# Patient Record
Sex: Female | Born: 1970
Health system: Southern US, Community
[De-identification: ages and names within clinical notes are randomized; demographics above are authoritative.]

## PROBLEM LIST (undated history)

## (undated) DIAGNOSIS — M67813 Other specified disorders of tendon, right shoulder: Secondary | ICD-10-CM

## (undated) DIAGNOSIS — K589 Irritable bowel syndrome without diarrhea: Secondary | ICD-10-CM

## (undated) DIAGNOSIS — T7840XA Allergy, unspecified, initial encounter: Secondary | ICD-10-CM

## (undated) DIAGNOSIS — R0600 Dyspnea, unspecified: Secondary | ICD-10-CM

## (undated) DIAGNOSIS — J45909 Unspecified asthma, uncomplicated: Secondary | ICD-10-CM

## (undated) DIAGNOSIS — E739 Lactose intolerance, unspecified: Secondary | ICD-10-CM

## (undated) DIAGNOSIS — Z780 Asymptomatic menopausal state: Secondary | ICD-10-CM

## (undated) DIAGNOSIS — M2550 Pain in unspecified joint: Secondary | ICD-10-CM

## (undated) DIAGNOSIS — H269 Unspecified cataract: Secondary | ICD-10-CM

## (undated) DIAGNOSIS — T884XXA Failed or difficult intubation, initial encounter: Secondary | ICD-10-CM

## (undated) DIAGNOSIS — I10 Essential (primary) hypertension: Secondary | ICD-10-CM

## (undated) DIAGNOSIS — R51 Headache: Secondary | ICD-10-CM

## (undated) DIAGNOSIS — G43909 Migraine, unspecified, not intractable, without status migrainosus: Secondary | ICD-10-CM

## (undated) DIAGNOSIS — R519 Headache, unspecified: Secondary | ICD-10-CM

## (undated) DIAGNOSIS — Z87820 Personal history of traumatic brain injury: Secondary | ICD-10-CM

## (undated) DIAGNOSIS — E559 Vitamin D deficiency, unspecified: Secondary | ICD-10-CM

## (undated) DIAGNOSIS — N1831 Chronic kidney disease, stage 3a: Secondary | ICD-10-CM

## (undated) DIAGNOSIS — Z9889 Other specified postprocedural states: Secondary | ICD-10-CM

## (undated) DIAGNOSIS — M7541 Impingement syndrome of right shoulder: Secondary | ICD-10-CM

## (undated) DIAGNOSIS — Z91018 Allergy to other foods: Secondary | ICD-10-CM

## (undated) DIAGNOSIS — R600 Localized edema: Secondary | ICD-10-CM

## (undated) DIAGNOSIS — R002 Palpitations: Secondary | ICD-10-CM

## (undated) DIAGNOSIS — F419 Anxiety disorder, unspecified: Secondary | ICD-10-CM

## (undated) DIAGNOSIS — L219 Seborrheic dermatitis, unspecified: Secondary | ICD-10-CM

## (undated) DIAGNOSIS — R112 Nausea with vomiting, unspecified: Secondary | ICD-10-CM

## (undated) DIAGNOSIS — N979 Female infertility, unspecified: Secondary | ICD-10-CM

## (undated) DIAGNOSIS — D649 Anemia, unspecified: Secondary | ICD-10-CM

## (undated) DIAGNOSIS — E785 Hyperlipidemia, unspecified: Secondary | ICD-10-CM

## (undated) DIAGNOSIS — H209 Unspecified iridocyclitis: Secondary | ICD-10-CM

## (undated) DIAGNOSIS — K760 Fatty (change of) liver, not elsewhere classified: Secondary | ICD-10-CM

## (undated) DIAGNOSIS — H35039 Hypertensive retinopathy, unspecified eye: Secondary | ICD-10-CM

## (undated) DIAGNOSIS — M549 Dorsalgia, unspecified: Secondary | ICD-10-CM

## (undated) DIAGNOSIS — E0789 Other specified disorders of thyroid: Secondary | ICD-10-CM

## (undated) DIAGNOSIS — M7500 Adhesive capsulitis of unspecified shoulder: Secondary | ICD-10-CM

## (undated) HISTORY — DX: Allergy to other foods: Z91.018

## (undated) HISTORY — DX: Headache: R51

## (undated) HISTORY — DX: Unspecified cataract: H26.9

## (undated) HISTORY — DX: Dorsalgia, unspecified: M54.9

## (undated) HISTORY — DX: Adhesive capsulitis of unspecified shoulder: M75.00

## (undated) HISTORY — DX: Hyperlipidemia, unspecified: E78.5

## (undated) HISTORY — PX: BREAST SURGERY: SHX581

## (undated) HISTORY — DX: Irritable bowel syndrome, unspecified: K58.9

## (undated) HISTORY — DX: Fatty (change of) liver, not elsewhere classified: K76.0

## (undated) HISTORY — DX: Unspecified iridocyclitis: H20.9

## (undated) HISTORY — PX: LEFT OOPHORECTOMY: SHX1961

## (undated) HISTORY — PX: TUBAL LIGATION: SHX77

## (undated) HISTORY — DX: Lactose intolerance, unspecified: E73.9

## (undated) HISTORY — DX: Vitamin D deficiency, unspecified: E55.9

## (undated) HISTORY — DX: Asymptomatic menopausal state: Z78.0

## (undated) HISTORY — DX: Other specified disorders of thyroid: E07.89

## (undated) HISTORY — DX: Localized edema: R60.0

## (undated) HISTORY — DX: Other specified disorders of tendon, right shoulder: M67.813

## (undated) HISTORY — DX: Seborrheic dermatitis, unspecified: L21.9

## (undated) HISTORY — DX: Headache, unspecified: R51.9

## (undated) HISTORY — DX: Female infertility, unspecified: N97.9

## (undated) HISTORY — DX: Allergy, unspecified, initial encounter: T78.40XA

## (undated) HISTORY — DX: Hypertensive retinopathy, unspecified eye: H35.039

## (undated) HISTORY — DX: Migraine, unspecified, not intractable, without status migrainosus: G43.909

## (undated) HISTORY — DX: Palpitations: R00.2

## (undated) HISTORY — DX: Dyspnea, unspecified: R06.00

## (undated) HISTORY — DX: Personal history of traumatic brain injury: Z87.820

## (undated) HISTORY — DX: Anemia, unspecified: D64.9

## (undated) HISTORY — PX: BACK SURGERY: SHX140

## (undated) HISTORY — DX: Pain in unspecified joint: M25.50

## (undated) NOTE — *Deleted (*Deleted)
[facility] - Clinic Note  03/23/2020     CHIEF COMPLAINT Patient presents for No chief complaint on file.   HISTORY OF PRESENT ILLNESS: [name] is a 35 y.o. female who presents to the clinic today for:   pt states she has been having a hard time the past couple of days, she is using a red top drop to help with the pain, she states increasing AT's helped her vision while at work  Referring physician: Sherlene Shams, MD 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 brimonidine (ALPHAGAN) 0.15 % ophthalmic solution Place 1 drop into both eyes in the morning and at bedtime.  . Bromfenac Sodium (PROLENSA) 0.07 % SOLN Place 1 drop into both eyes 4 (four) times daily.  . cyclopentolate (CYCLODRYL,CYCLOGYL) 1 % ophthalmic solution 1 drop 2 (two) times daily.  . dorzolamide-timolol (COSOPT) 22.3-6.8 MG/ML ophthalmic solution Place 1 drop into both eyes 2 (two) times daily.  . ISOPTO ATROPINE 1 % ophthalmic solution   . prednisoLONE acetate (PRED FORTE) 1 % ophthalmic suspension Place 1 drop into both eyes every hour. (Patient taking differently: Place 1 drop into both eyes every 2 (two) hours while awake. )   No current facility-administered medications for this visit. (Ophthalmic Drugs)   Current Outpatient Medications (Other)  Medication Sig  . albuterol (VENTOLIN HFA) 108 (90 Base) MCG/ACT inhaler Inhale 2 puffs into the lungs every 6 (six) hours as needed for wheezing or shortness of breath.  . ALPRAZolam (XANAX) 0.5 MG tablet Take 1 tablet (0.5 mg total) by mouth 3 (three) times daily as needed (Headache).  [name] aspirin EC 81 MG tablet Take 81 mg by mouth daily.  [name] atorvastatin (LIPITOR) 20 MG tablet Take 1 tablet (20 mg total) by mouth daily at 6  PM.  . benzonatate (TESSALON) 100 MG capsule Take 1 capsule (100 mg total) by mouth 3 (three) times daily as needed for cough.  . Blood Glucose Monitoring Suppl (FREESTYLE LITE) DEVI 1 each by Does not apply route 2 (two) times daily. E11.9  . butalbital-acetaminophen-caffeine (FIORICET) 50-325-40 MG tablet Take 1 tablet by mouth every 6 (six) hours as needed for headache. Do not refill in less than 30 day  . celecoxib (CELEBREX) 200 MG capsule Take 1 capsule (200 mg total) by mouth 2 (two) times daily as needed.  . diazepam (VALIUM) 2 MG tablet Take 1 tablet (2 mg total) by mouth every 6 (six) hours as needed (Headache).  . diltiazem (CARDIZEM LA) 240 MG 24 hr tablet TAKE 1 TABLET BY MOUTH DAILY  . EPINEPHrine 0.3 mg/0.3 mL IJ SOAJ injection Inject 0.3 mLs (0.3 mg total) into the muscle as needed (for allergic reaction).  [name] ergocalciferol (DRISDOL) 1.25 MG (50000 UT) capsule Take 1 capsule (50,000 Units total) by mouth once a week.  . Fremanezumab-vfrm (AJOVY) 225 MG/1.5ML SOAJ Inject 675 mg into the skin every 3 (three) months.  [name] glucose blood (FREESTYLE LITE) test strip 1 each by Other route 2 (two) times daily. E11.9  . hydrochlorothiazide (MICROZIDE) 12.5 MG capsule TAKE 1 CAPSULE BY MOUTH DAILY.  [name] insulin lispro (HUMALOG KWIKPEN) 100 UNIT/ML KwikPen Inject 0.2 mLs (20 Units total) into the skin 2 (two) times daily with a meal.  . Insulin Pen Needle (PEN NEEDLES  31GX5/16") 31G X 8 MM MISC 1 each by Does not apply route in the morning, at noon, in the evening, and at bedtime. E11.9  . ketorolac (TORADOL) 10 MG tablet Take 1 tablet (10 mg total) by mouth every 6 (six) hours as needed. (Patient taking differently: Take 10 mg by mouth every 6 (six) hours as needed (Headache). )  . Lancets (FREESTYLE) lancets 1 each by Other route 2 (two) times daily. E11.9  . losartan-hydrochlorothiazide (HYZAAR) 50-12.5 MG tablet Take 1 tablet by mouth daily.  . metFORMIN (GLUCOPHAGE-XR) 500 MG 24 hr tablet Take  2 tablets (1,000 mg total) by mouth 2 (two) times daily. (Patient taking differently: Take 1,000 mg by mouth daily. )  . metoprolol succinate (TOPROL-XL) 25 MG 24 hr tablet TAKE 1/2 TABLET BY MOUTH DAILY.  [name] ondansetron (ZOFRAN ODT) 4 MG disintegrating tablet Take 1 tablet (4 mg total) by mouth every 8 (eight) hours as needed for nausea or vomiting.  [name] oxyCODONE-acetaminophen (PERCOCET/ROXICET) 5-325 MG tablet Take by mouth as needed for severe pain.  . Semaglutide, 1 MG/DOSE, (OZEMPIC, 1 MG/DOSE,) 2 MG/1.5ML SOPN Inject 0.75 mLs (1 mg total) into the skin once a week.  . TRESIBA FLEXTOUCH 100 UNIT/ML FlexTouch Pen INJECT 0.6 MLS (60 UNITS TOTAL) INTO THE SKIN DAILY.   No current facility-administered medications for this visit. (Other)      REVIEW OF SYSTEMS:    ALLERGIES Allergies  Allergen Reactions  . Bamlanivimab Anaphylaxis, Itching, Palpitations, Rash, Shortness Of Breath and Swelling  . Botox [Onabotulinumtoxina] Shortness Of Breath    syncope  . Clindamycin/Lincomycin Other (See Comments)  . Kiwi Extract Shortness Of Breath  . Maxalt [Rizatriptan Benzoate] Anaphylaxis    Chest pain  . Nitrous Oxide Shortness Of Breath    syncope  . Triptans Shortness Of Breath  . Aspirin Nausea And Vomiting    Mouth blisters  . Contrast Media [Iodinated Diagnostic Agents]   . Flagyl [Metronidazole]   . Hydrocodone-Acetaminophen Other (See Comments)  . Latex     Sometimes causes rash  . Mango Flavor   . Rizatriptan Other (See Comments)  . Vicodin [Hydrocodone-Acetaminophen]     hallucinations  . Flu Virus Vaccine Palpitations  . Penicillins Nausea And Vomiting, Rash and Other (See Comments)    Did it involve swelling of the face/tongue/throat, SOB, or low BP? No Did it involve sudden or severe rash/hives, skin peeling, or any reaction on the inside of your mouth or nose? No Did you need to seek medical attention at a hospital or doctor's office? Yes When did it last  happen?patient was 32 years old If all above answers are "NO", may proceed with cephalosporin use.  . Tetracyclines & Related Nausea And Vomiting and Rash    PAST MEDICAL HISTORY Past Medical History:  Diagnosis Date  . Allergy   . Anemia   . Anxiety    claustrophobic  . Asthma   . Back pain   . Biceps tendonosis of right shoulder   . Diabetes mellitus 2011   did not start metforfin, losing weight  . Dyspnea   . Fatty liver   . Food allergy   . Headache disorder   . History of concussion   . HLD (hyperlipidemia)   . Hypertension   . IBS (irritable bowel syndrome)   . Infertility, female   . Joint pain   . Lactose intolerance   . Leg edema   . Migraines   . Other specified disorders of thyroid   .  Palpitation   . Post-menopausal   . Seborrheic dermatitis   . Shoulder impingement syndrome, right   . Vitamin D deficiency    Past Surgical History:  Procedure Laterality Date  . ABDOMINAL HYSTERECTOMY  2006   heavy menses, endometriosis, l oophrectomy  . BACK SURGERY    . BREAST BIOPSY Right 2018   benign  . BREAST EXCISIONAL BIOPSY Right 2003   benign  . BREAST EXCISIONAL BIOPSY Right 1999   benign  . BREAST SURGERY     right breast x 2 , benign  . HERNIA REPAIR  2003   left inguinal   . LEFT OOPHORECTOMY    . SHOULDER ARTHROSCOPY WITH SUBACROMIAL DECOMPRESSION AND OPEN ROTATOR C Right 07/14/2016   Procedure: RIGHT SHOULDER ARTHROSCOPY WITH SUBACROMIAL DECOMPRESSION, DISTAL CLAVICLE RESECTION AND MINI OPEN ROTATOR CUFF REPAIR, OPEN BICEP TENDODESIS;  Surgeon: Valeria Batman, MD;  Location: [facility];  Service: Orthopedics;  Laterality: Right;  . SHOULDER CLOSED REDUCTION Right 09/08/2016   Procedure: RIGHT CLOSED MANIPULATION SHOULDER;  Surgeon: Valeria Batman, MD;  Location: [facility];  Service: Orthopedics;  Laterality: Right;  . TUBAL LIGATION      FAMILY HISTORY Family History  Problem Relation Age of Onset  .  Diabetes Mother   . Heart disease Mother 89  . Hypertension Mother   . Hyperlipidemia Mother   . Heart failure Mother   . Stroke Mother   . Thyroid disease Mother   . Depression Mother   . Sleep apnea Mother   . Obesity Mother   . Cancer Father 77       Lung Cancer  . Heart disease Father   . Mental illness Sister        bipolar, substance abuse,  clean 2 yrs  . Hypertension Sister   . Cancer Paternal Grandmother 53       breast cancer  . Breast cancer Paternal Grandmother   . Diabetes Maternal Grandmother   . Hypertension Maternal Grandmother   . Diabetes Son     SOCIAL HISTORY Social History   Tobacco Use  . Smoking status: Never Smoker  . Smokeless tobacco: Never Used  Vaping Use  . Vaping Use: Never used  Substance Use Topics  . Alcohol use: No  . 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 Retinal edema  H35.81   3. Essential hypertension  I10   4. Hypertensive retinopathy of both eyes  H35.033   5. Diabetes mellitus type 2 without retinopathy (HCC)  E11.9   6. Combined forms of age-related cataract of both eyes  H25.813     1,2. Anterior uveitis OU  - eye pain, redness, photophobia since Dec 2020, after getting COVID infection (was hospitalized for 2 wks)  - has been managed by [facility]  - underwent limited lab work up on 06/21/19   Quant-Gold, ACE, ANA, HLA-B27 -- all normal   ESR slightly elevated (37)  - initial exam here 08.18.21 -- +conj injection OU (OD > OS); 1-2+ AC cell OU; no evidence of posterior involvement  - today, BCVA 20/20 OD; 20/30 OS  (+dryness) and exam shows interval improvement in inflammation, but persistent corneal findings -- +PEE OU  -  suspect most of pt's symptoms related to cornea more than anterior uveitis -- working long hours in front of computer  -  OCT without macular edema OU  - FA 08.25.21 shows Mild focal late perivascular leakage temporal periphery OU -- ?inflammatory vs HTN  - continue   PF decrease to TID OU   prolensa ddecrease to TID OU   Cyclopentolate or tropicamide BID OU -- use as needed   Cosopt BID OU   Brimonidine BID OU  - cont Systane QID OU  - add lubricating gel/ointment QHS OU  - IOP 17 -- improved on brim and cosopt, likely steroid response  - f/u in 2-3 weeks-- DFE/OCT  3,4. Hypertensive retinopathy OU - discussed importance of tight BP control - monitor  5. Diabetes mellitus, type 2 without retinopathy  - The incidence, risk factors for progression, natural history and treatment options for diabetic retinopathy  were discussed with patient.    - The need for close monitoring of blood glucose, blood pressure, and serum lipids, avoiding cigarette or any type of tobacco, and the need for long term follow up was also discussed with patient.  - f/u in 1 year, sooner prn  6. Mixed cataract OU  - The symptoms of cataract, surgical options, and treatments and risks were discussed with patient.  - discussed diagnosis and progression  - not yet visually significant  - monitor for now   Ophthalmic Meds Ordered this visit:  No orders of the defined types were placed in this encounter.     No follow-ups on file.  There are no Patient Instructions on file for this visit.  This document serves as a record of services personally performed by Karie Chimera, MD, PhD. It was created on their behalf by Herby Abraham, COA, an ophthalmic technician. The creation of this record is the provider's dictation and/or activities during the visit.    Electronically signed by: Herby Abraham, COA @TODAY @ 11:01 AM   Abbreviations: M myopia (nearsighted); A astigmatism; H hyperopia (farsighted); P presbyopia; Mrx spectacle prescription;  CTL contact lenses; OD right eye; OS left eye; OU both eyes  XT exotropia; ET  esotropia; PEK punctate epithelial keratitis; PEE punctate epithelial erosions; DES dry eye syndrome; MGD meibomian gland dysfunction; ATs artificial tears; PFAT's preservative free artificial tears; NSC nuclear sclerotic cataract; PSC posterior subcapsular cataract; ERM epi-retinal membrane; PVD posterior vitreous detachment; RD retinal detachment; DM diabetes mellitus; DR diabetic retinopathy; NPDR non-proliferative diabetic retinopathy; PDR proliferative diabetic retinopathy; CSME clinically significant macular edema; DME diabetic macular edema; dbh dot blot hemorrhages; CWS cotton wool spot; POAG primary open angle glaucoma; C/D cup-to-disc ratio; HVF humphrey visual field; GVF goldmann visual field; OCT optical coherence tomography; IOP intraocular pressure; BRVO Branch retinal vein occlusion; CRVO central retinal vein occlusion; CRAO central retinal artery occlusion; BRAO branch retinal artery occlusion; RT retinal tear; SB scleral buckle; PPV pars plana vitrectomy; VH Vitreous hemorrhage; PRP panretinal laser photocoagulation; IVK intravitreal kenalog; VMT vitreomacular traction; MH Macular hole;  NVD neovascularization of the disc; NVE neovascularization elsewhere; AREDS age related eye disease study; ARMD age related macular degeneration; POAG primary open angle glaucoma; EBMD epithelial/anterior basement membrane dystrophy; ACIOL anterior chamber intraocular lens; IOL intraocular lens; PCIOL posterior chamber intraocular lens; Phaco/IOL phacoemulsification with intraocular lens placement; PRK photorefractive keratectomy; LASIK laser assisted in situ keratomileusis; HTN hypertension; DM diabetes mellitus; COPD chronic obstructive pulmonary disease

---

## 1997-06-06 HISTORY — PX: BREAST EXCISIONAL BIOPSY: SUR124

## 1997-10-01 ENCOUNTER — Emergency Department (HOSPITAL_COMMUNITY): Admission: EM | Admit: 1997-10-01 | Discharge: 1997-10-01 | Payer: Self-pay | Admitting: *Deleted

## 1998-05-06 ENCOUNTER — Other Ambulatory Visit: Admission: RE | Admit: 1998-05-06 | Discharge: 1998-05-06 | Payer: Self-pay | Admitting: Obstetrics & Gynecology

## 1998-08-13 ENCOUNTER — Emergency Department (HOSPITAL_COMMUNITY): Admission: EM | Admit: 1998-08-13 | Discharge: 1998-08-13 | Payer: Self-pay | Admitting: Emergency Medicine

## 1998-08-23 ENCOUNTER — Inpatient Hospital Stay (HOSPITAL_COMMUNITY): Admission: AD | Admit: 1998-08-23 | Discharge: 1998-08-23 | Payer: Self-pay | Admitting: Obstetrics and Gynecology

## 1998-10-22 ENCOUNTER — Encounter: Admission: RE | Admit: 1998-10-22 | Discharge: 1998-11-04 | Payer: Self-pay | Admitting: *Deleted

## 1999-09-14 ENCOUNTER — Other Ambulatory Visit: Admission: RE | Admit: 1999-09-14 | Discharge: 1999-09-14 | Payer: Self-pay

## 1999-09-14 ENCOUNTER — Encounter (INDEPENDENT_AMBULATORY_CARE_PROVIDER_SITE_OTHER): Payer: Self-pay | Admitting: Specialist

## 2000-09-27 ENCOUNTER — Encounter: Payer: Self-pay | Admitting: Obstetrics and Gynecology

## 2000-09-27 ENCOUNTER — Ambulatory Visit (HOSPITAL_COMMUNITY): Admission: RE | Admit: 2000-09-27 | Discharge: 2000-09-27 | Payer: Self-pay | Admitting: Obstetrics and Gynecology

## 2001-06-06 HISTORY — PX: HERNIA REPAIR: SHX51

## 2001-06-06 HISTORY — PX: BREAST EXCISIONAL BIOPSY: SUR124

## 2001-06-06 HISTORY — PX: INGUINAL HERNIA REPAIR: SHX194

## 2001-10-09 ENCOUNTER — Other Ambulatory Visit: Admission: RE | Admit: 2001-10-09 | Discharge: 2001-10-09 | Payer: Self-pay | Admitting: Obstetrics and Gynecology

## 2003-06-07 HISTORY — PX: LUMBAR FUSION: SHX111

## 2003-10-13 ENCOUNTER — Other Ambulatory Visit: Payer: Self-pay

## 2003-10-21 ENCOUNTER — Inpatient Hospital Stay (HOSPITAL_COMMUNITY): Admission: RE | Admit: 2003-10-21 | Discharge: 2003-10-22 | Payer: Self-pay | Admitting: Specialist

## 2004-06-06 HISTORY — PX: LEFT OOPHORECTOMY: SHX1961

## 2004-06-06 HISTORY — PX: ABDOMINAL HYSTERECTOMY: SHX81

## 2006-03-27 ENCOUNTER — Ambulatory Visit: Payer: Self-pay | Admitting: Gynecology

## 2006-03-27 ENCOUNTER — Encounter (INDEPENDENT_AMBULATORY_CARE_PROVIDER_SITE_OTHER): Payer: Self-pay | Admitting: Gynecology

## 2006-11-01 ENCOUNTER — Ambulatory Visit: Payer: Self-pay | Admitting: Obstetrics & Gynecology

## 2006-11-15 ENCOUNTER — Ambulatory Visit: Payer: Self-pay | Admitting: Obstetrics & Gynecology

## 2006-12-04 ENCOUNTER — Ambulatory Visit: Payer: Self-pay | Admitting: Gynecology

## 2007-01-16 ENCOUNTER — Ambulatory Visit (HOSPITAL_COMMUNITY): Admission: RE | Admit: 2007-01-16 | Discharge: 2007-01-16 | Payer: Self-pay | Admitting: Gynecology

## 2007-01-16 ENCOUNTER — Ambulatory Visit: Payer: Self-pay | Admitting: Gynecology

## 2007-01-29 ENCOUNTER — Ambulatory Visit: Payer: Self-pay | Admitting: Gynecology

## 2007-03-01 ENCOUNTER — Ambulatory Visit: Payer: Self-pay | Admitting: Gynecology

## 2007-03-01 ENCOUNTER — Ambulatory Visit (HOSPITAL_COMMUNITY): Admission: RE | Admit: 2007-03-01 | Discharge: 2007-03-02 | Payer: Self-pay | Admitting: Gynecology

## 2007-03-01 ENCOUNTER — Encounter (INDEPENDENT_AMBULATORY_CARE_PROVIDER_SITE_OTHER): Payer: Self-pay | Admitting: Gynecology

## 2007-03-19 ENCOUNTER — Inpatient Hospital Stay (HOSPITAL_COMMUNITY): Admission: AD | Admit: 2007-03-19 | Discharge: 2007-03-20 | Payer: Self-pay | Admitting: Obstetrics & Gynecology

## 2007-04-09 ENCOUNTER — Ambulatory Visit: Payer: Self-pay | Admitting: Gynecology

## 2008-11-13 ENCOUNTER — Ambulatory Visit: Payer: Self-pay | Admitting: Obstetrics and Gynecology

## 2008-11-13 LAB — CONVERTED CEMR LAB
Chlamydia, DNA Probe: NEGATIVE
GC Probe Amp, Genital: NEGATIVE
HCV Ab: NEGATIVE
Hepatitis B Surface Ag: NEGATIVE
Trich, Wet Prep: NONE SEEN
Yeast Wet Prep HPF POC: NONE SEEN

## 2009-03-26 ENCOUNTER — Ambulatory Visit: Payer: Self-pay | Admitting: Family Medicine

## 2009-04-10 ENCOUNTER — Ambulatory Visit: Payer: Self-pay | Admitting: *Deleted

## 2009-04-23 ENCOUNTER — Ambulatory Visit: Payer: Self-pay | Admitting: Obstetrics & Gynecology

## 2009-04-23 ENCOUNTER — Encounter: Payer: Self-pay | Admitting: Obstetrics and Gynecology

## 2009-04-23 LAB — CONVERTED CEMR LAB
Chlamydia, DNA Probe: NEGATIVE
GC Probe Amp, Genital: NEGATIVE
HCV Ab: NEGATIVE
Hepatitis B Surface Ag: NEGATIVE

## 2009-04-24 ENCOUNTER — Encounter: Payer: Self-pay | Admitting: Obstetrics and Gynecology

## 2009-04-24 ENCOUNTER — Ambulatory Visit (HOSPITAL_COMMUNITY): Admission: RE | Admit: 2009-04-24 | Discharge: 2009-04-24 | Payer: Self-pay | Admitting: Obstetrics and Gynecology

## 2009-04-24 LAB — CONVERTED CEMR LAB
Clue Cells Wet Prep HPF POC: NONE SEEN
Trich, Wet Prep: NONE SEEN
WBC, Wet Prep HPF POC: NONE SEEN
Yeast Wet Prep HPF POC: NONE SEEN

## 2009-07-31 ENCOUNTER — Emergency Department: Payer: Self-pay | Admitting: Internal Medicine

## 2010-02-16 ENCOUNTER — Ambulatory Visit: Payer: Self-pay | Admitting: General Practice

## 2010-10-19 NOTE — H&P (Signed)
[name], SALO NO.:  [id number]   MEDICAL RECORD NO.:  [id number]         PATIENT TYPE:  WOIB   LOCATION:                                FACILITY:  [facility]   PHYSICIAN:  Ginger Carne, MD  DATE OF BIRTH:  1970/10/07   DATE OF ADMISSION:  02/28/2007  DATE OF DISCHARGE:                              HISTORY & PHYSICAL   Surgery scheduled for February 28, 2007.   REASON FOR HOSPITALIZATION:  Stage I endometriosis, chronic pelvic pain  and menometrorrhagia.   HISTORY OF PRESENT ILLNESS:  This patient is a 40 year old gravida 3,  para 2-0-1-2 female, admitted for a total vaginal hysterectomy and left  salpingo-oophorectomy.  The patient has had a longstanding history of  lower abdominal pain for years.  She has been evaluated by a laparoscopy  in August 2008, which demonstrated stage I endometriosis.  The patient  complains of dysmenorrhea, dyspareunia.  Her menses are 4 days in  length, significant heavy flow, and are about 28 days apart.  The  patient has had a history of hypertension which had precluded her use of  oral contraceptives.  When she has taken them in the past, her blood  pressure has been raised above the norm.  She had been offered Depo-  Lupron, which she declined, and daily norethindrone acetate.  She has no  genitourinary or gastrointestinal sources for her discomfort.  She has  had a transvaginal ultrasound which revealed no evidence for  intracavitary lesions including polyps or fibroids.  No intramural  lesions.  She has had a normal Pap smear within the past 12 months.   OB/GYN HISTORY:  The patient has had a bilateral tubal ligation in 2004.  She has had 2 normal vaginal deliveries in 1990 in 2004, and a right  salpingectomy because of an ectopic pregnancy in 1995.   ALLERGIES:  1. PENICILLIN  2. VICODIN  3. MAXALT  4. TETRACYCLINE  5. ASPIRIN.   She is able to take Percocet.   CURRENT MEDICATIONS:  1. Hydrochlorothiazide  12.5 mg daily.  2. Potassium 10 mEq per day.   PAST SURGICAL HISTORY:  1. The patient has had a left inguinal hernia repair.  2. Right breast biopsy x2.  3. Disk surgery in 2005.  4. A diagnostic laparoscopy is August 2008.  5. She has also had a myomectomy in 2002.  6. A presacral neurectomy in 2001.   REVIEW OF SYSTEMS:  14-point comprehensive review of systems within  normal limits.   SOCIAL HISTORY:  The patient is a nonsmoker.  Denies alcohol or illicit  drug abuse.   FAMILY HISTORY:  The patient has no first-degree relatives with breast,  ovarian or uterine carcinoma.   PHYSICAL EXAM:  Blood pressure is 119/79, weight 223 pounds, height 5  feet 5 inches.  HEENT: Grossly normal.  BREASTS:  Exam without mass, discharge, thickenings or tenderness.  CHEST:  Clear to percussion and auscultation.  CARDIOVASCULAR:  Exam without murmurs or enlargements.  Regular rate and  rhythm.  EXTREMITY/LYMPHATIC/SKIN/NEUROLOGICAL/MUSCULOSKELETAL SYSTEMS:  Within  normal limits.  ABDOMEN:  Soft without  gross hepatosplenomegaly.  PELVIC EXAM:  External genitalia, vulva and vagina normal.  Cervix  smooth without erosions or lesions.  Uterus is globular, about 4-6 weeks  in size.  Both adnexa palpable, found to be normal.  There is slight  tenderness on both sides.   IMPRESSION:  Chronic pelvic pain with menorrhagia and history of  endometriosis.   PLAN:  The patient declines the use of oral contraceptives for  hypertension reasons, and declined the use of Depo-Lupron and/or  norethindrone acetate.  She also declined a Mirena IUD.  She has no  desire for further childbearing; has had a tubal ligation and,  therefore, desires definitive surgery.  She has opted for a total  vaginal hysterectomy and left salpingo-oophorectomy with preservation of  right tube and ovary.  Ashby Dawes of said procedure discussed in detail  including risks of injury to ureter, bowel and bladder, possible  conversion  to a laparoscopic or open procedure, hemorrhage requiring a  blood transfusion, infection and other postoperative complications.  She  understands there is a 40% chance approximately of having her right  adnexa removed due to pain in the future.  The patient also understands  that she may continue to have pain in the future in her pelvis.  All  questions answered to the satisfaction of said patient, and the patient  verbalized understanding of same      Ginger Carne, MD  Electronically Signed     SHB/MEDQ  D:  02/27/2007  T:  02/27/2007  Job:  [id number]

## 2010-10-19 NOTE — Assessment & Plan Note (Signed)
NAME:  [name], TIETJE NO.:  [id number]   MEDICAL RECORD NO.:  [id number]          PATIENT TYPE:  POB   LOCATION:  [facility]         FACILITY:  [facility]   PHYSICIAN:  Argentina Donovan, MD        DATE OF BIRTH:  [birth year]   DATE OF SERVICE:  11/13/2008                                  CLINIC NOTE   The patient is a 40 year old African American female gravida 2, para 2-0-  0-2, who underwent total abdominal hysterectomy and left salpingo-  oophorectomy in late 2008.  She still has a right ovary, which was not  menopausal.  She comes in today complaining of vaginal irritation and  desires to be checked for STDs.  She has frequent bouts of vulvar  irritation.  She has sensitive skin as she has some early signs of  psoriasis around the hairline on her forehead and she has used vaginal  products over-the-counter occasionally with immediate burning denoting a  chemical irritation rather than an allergy.  She said when she used the  metronidazole gel, she developed blisters.  On examination, external  genitalia is normal.  BUS within normal limits.  Vagina is clean and  well rugated.  The apex is status hysterectomy, is not a significant  amount of discharge.  There is no sign of granulation tissue.  DNA for  chlamydia and GC were taken as well as a wet prep.  There is no sign of  a positive whiff and the patient will be screened for HIV, hepatitis,  and syphilis.  I talked to her about chemical irritants, especially  soaps, soft toilet tissue, etc, and or using over-the-counter  medications.  I have told her that we would contact her if there is any  need for medication at this point.   IMPRESSION:  Occasional vulvar irritation, skin sensitivity to  chemicals.           ______________________________  Argentina Donovan, MD     PR/MEDQ  D:  11/13/2008  T:  11/13/2008  Job:  [id number]

## 2010-10-19 NOTE — Assessment & Plan Note (Signed)
[name], [name] NO.:  [id number]   MEDICAL RECORD NO.:  [id number]          PATIENT TYPE:  POB   LOCATION:  [facility]         FACILITY:  [facility]   PHYSICIAN:  Elsie Lincoln, MD      DATE OF BIRTH:  [birth year]   DATE OF SERVICE:                                  CLINIC NOTE   The patient is a 40 year old female who is status post hysterectomy on  March 01, 2007 for chronic pelvic pain.  The uterus showed  __________ endometria, no hyperplasia.  Mild chronic cervicitis and no  dysplasia.  The left ovary and fallopian tube, she had a hemorrhagic  corpus luteum benign ovarian cystic follicles with no evidence of  malignancy.  The patient had sex a couple of days ago for the first  time.  It was not painful.  She is having some bowel issues, but she has  been diagnosed with irritable bowel syndrome and is going to try Activia  yogurt.  She waxes and wanes between constipation and diarrhea.  She is  not having urinary symptoms.  She is extremely happy with her surgery.  She does have 1 ovary left, she is not in menopause.   On physical exam:  Blood pressure 135/99.  Weight 223.5.  Pulse 78.  The vagina is pink, normal rugae, __________ intact.  Adnexa, no masses,  nontender.   ASSESSMENT AND PLAN:  A 40 year old female status post transvaginal  hysterectomy, left salpingo-oophorectomy, doing well.  Yearly exam,  however, no Pap smear needed as she has never had any freezing or  burning of her cervix and her pathology was negative for any dysplasia.  The patient is to follow up with her primary care Jazzmyn Filion for irritable  bowel.           ______________________________  Elsie Lincoln, MD     KL/MEDQ  D:  04/09/2007  T:  04/10/2007  Job:  [id number]

## 2010-10-19 NOTE — Op Note (Signed)
[name], LANYON              ACCOUNT NO.:  [id number]   MEDICAL RECORD NO.:  [id number]          PATIENT TYPE:  OIB   LOCATION:  9319                          FACILITY:  [facility]   PHYSICIAN:  Ginger Carne, MD  DATE OF BIRTH:  06/08/1970   DATE OF PROCEDURE:  03/01/2007  DATE OF DISCHARGE:                               OPERATIVE REPORT   PREOPERATIVE DIAGNOSIS:  Endometriosis, chronic pelvic pain and  menorrhagia.   POSTOPERATIVE DIAGNOSIS:  Endometriosis, chronic pelvic pain and  menorrhagia.   PROCEDURE:  Total vaginal hysterectomy, left salpingo-oophorectomy with  preservation of right tube and ovary.   SURGEON:  Ginger Carne, MD   ASSISTANT:  Dr. Okey Dupre.   ESTIMATED BLOOD LOSS:  Less than 50 mL.   SPECIMEN:  Uterus, cervix, left tube and ovary to pathology.   ANESTHESIA:  General.   OPERATIVE FINDINGS:  External genitalia, vulva and vagina normal.  Cervix smooth without erosions or lesions.  The uterus was globular  about 4 to 6 weeks in size, endometriotic lesions noted on the left  ovary, right tube and ovary appeared normal.  The patient had been  previously laparoscoped six weeks prior.   OPERATIVE PROCEDURE:  The patient prepped and draped in usual fashion  and placed in lithotomy position.  Betadine solution used for antiseptic  and the patient was catheterized prior to the procedure.  After adequate  general anesthesia, tenaculum placed on the anterior posterior lips of  the cervix.  Marcaine with epinephrine utilized as a paracervical block.  2 cm of anterior and posterior vaginal epithelium were incised  transversely.  Peritoneal reflections identified, opened without injury  to their respective organs.  Following this, the uterosacral and  cardinal ligament complexes clamped, cut and ligated with 0 Vicryl  suture and affixed to their respective vaginal walls.  The uterine  vasculature was clamped and ligated with 0 Vicryl suture.  This extended  to  the ascending branches.  The left infundibulopelvic ligament and  right utero-ovarian ligament was clamped, cut and ligated with  transfixation sutures twice.  Bleeding points hemostatically checked.  Blood clots removed.  Closure of the cuff in one layer of 0 Vicryl  running interlocking suture.  The patient tolerated the procedure well,  returned to post anesthesia recovery room in excellent condition.  Urine  clear at end of the procedure.      Ginger Carne, MD  Electronically Signed    SHB/MEDQ  D:  03/01/2007  T:  03/01/2007  Job:  [id number]

## 2010-10-19 NOTE — Assessment & Plan Note (Signed)
NAMESAFIA, PANZER NO.:  [id number]   MEDICAL RECORD NO.:  [id number]          PATIENT TYPE:  POB   LOCATION:  [facility] at [facility]         FACILITY:  [facility]   PHYSICIAN:  Elsie Lincoln, MD      DATE OF BIRTH:  [birth year]   DATE OF SERVICE:                                  CLINIC NOTE   The patient is a 40 year old female with new onset pelvic pain.  She saw  Dr. Cleta Alberts on May 28 who ordered a pelvic ultrasound, cultures.  Her  cultures were negative, her pelvic ultrasound showed a centimeters 1.1 x  0.7 x 0.8 simple cyst in the left ovary, uterus was normal and the right  ovary was normal as well.  Unlikely this cyst is causing this pain.  The  pain she has is not during intercourse, but after intercourse for  several days.  She recently has been constipated but she takes a [employer]  supplement which caused her to have regular bowel movements.  She has no  urinary complaints.  She is on the tramadol and that does seem to help.  I encourage her to wait several more weeks to see if she her pain  improves before we investigate anything surgically.  She does have  increasing cramps with her period.  However, she is hypertensive and I  do not think she would be a good candidate for long-term OCPs given that  she is hypertensive.  She is going to try to lose weight and possibly  this could help her blood pressure come down, she could possibly takes  OCPs at that point.  She does not want Depo.  Patient is to come back in  3 weeks.  She would see Dr. Mia Creek which I will gladly facilitate and  hopefully her pain will be better and will not need surgical  intervention.           ______________________________  Elsie Lincoln, MD     KL/MEDQ  D:  11/15/2006  T:  11/15/2006  Job:  [id number]

## 2010-10-19 NOTE — Assessment & Plan Note (Signed)
[name], [name] NO.:  [id number]   MEDICAL RECORD NO.:  [id number]          PATIENT TYPE:  POB   LOCATION:  [facility]         FACILITY:  [facility]   PHYSICIAN:  Elsie Lincoln, MD      DATE OF BIRTH:  [birth year]   DATE OF SERVICE:                                  CLINIC NOTE   The patient is a 40 year old female who complains of right pelvic pain.  The patient had a hysterectomy and a left salpingo-oophorectomy on  September 2008, by Dr. Blima Rich for supposed endometriosis.  There was no endometriosis on the pathology.  The patient has been  asymptomatic until recently, she is having some intermittent right lower  quadrant pain.  The patient had some irritable bowel symptoms before  that but she states that she is having normal bowel movements recently.  The patient had some pain with sex.  Also recently interestingly the  patient is separated from her husband.  Her husband cheated on her  recently.  She had a Photographer and he was found to be having  an affair with a woman at the gymnasium.  The patient requested repeat  STD screening as she did have some sex with him again after being  screened here earlier this year.  The patient denies burning with  urination but she is having some frequency.   PHYSICAL EXAMINATION:  ABDOMEN:  Obese, soft, nontender.  No  organomegaly.  No hernia.  GENITALIA:  Tanner 5.  Vagina pink, normal rugae.  Vaginal cuff intact.  Small amount of whitish physiologic discharge.  The patient is tender  over bladder.  The patient is tender over right levator, mildly tender  in the adnexa.  No masses felt throughout the abdomen.   ASSESSMENT/PLAN:  A 40 year old female with mild pelvic discomfort.  1. Sexually transmitted disease screening for exposure as described      above.  GC/Chlamydia, wet prep, hep B, hep C, HIV, RPR.  2. Transvaginal ultrasound.  3. UA, urine culture as the patient was actually more  tender over her      bladder.  4. Transvaginal ultrasound does not show anything, maybe the patient      would benefit from some physical therapy as the patient was very      tender over her levators.           ______________________________  Elsie Lincoln, MD     KL/MEDQ  D:  04/23/2009  T:  04/24/2009  Job:  [id number]

## 2010-10-19 NOTE — Discharge Summary (Signed)
[name], [name]              ACCOUNT NO.:  [id number]   MEDICAL RECORD NO.:  [id number]          PATIENT TYPE:  OIB   LOCATION:  9319                          FACILITY:  [facility]   PHYSICIAN:  Ginger Carne, MD  DATE OF BIRTH:  09-Apr-1971   DATE OF ADMISSION:  03/01/2007  DATE OF DISCHARGE:  03/02/2007                               DISCHARGE SUMMARY   REASON FOR HOSPITALIZATION:  Stage I endometriosis, chronic pelvic pain  and menorrhagia.   IN-HOSPITAL PROCEDURES:  Total vaginal hysterectomy and left salpingo-  oophorectomy with preservation of right tube and ovary.   FINAL DIAGNOSES:  1. Stage I endometriosis.  2. Chronic pelvic pain.  3. Menorrhagia.   HOSPITAL COURSE:  This is a 40 year old African-American female, who  underwent the above-mentioned procedure on March 01, 2007.  Her  postoperative course was unremarkable.  Creatinine 0.76, H and H 33.9  and 11.5 postoperatively.  She was afebrile, voided well.  Abdomen soft,  positive bowel sounds.  Lungs clear.  Calves without tenderness.  Scant  vaginal flow.   She was discharged with routine postoperative instructions, including  contacting the office for temperature elevation above 100.4 degrees,  Fahrenheit; increasing abdominal pain, vaginal bleeding, GI or GU  complaints.  She was asked to be seen in the office in four weeks for  her postoperative visit.   She will be sent home with Percocet 5/325 one to two every four to six  hours for analgesia and continue her home medications, including  hydrochlorothiazide 12.5 mg daily p.o. and K-Dur 10 mEq one daily and  Ventolin inhaler to be used as needed.  All questions were answered to  the satisfaction of said patient and patient verbalized understanding of  same.      Ginger Carne, MD  Electronically Signed     SHB/MEDQ  D:  03/02/2007  T:  03/02/2007  Job:  [id number]

## 2010-10-19 NOTE — Op Note (Signed)
NAMEZENITH, KERCHEVAL              ACCOUNT NO.:  [id number]   MEDICAL RECORD NO.:  [id number]          PATIENT TYPE:  AMB   LOCATION:  [facility]                           FACILITY:  [facility]   PHYSICIAN:  Ginger Carne, MD  DATE OF BIRTH:  1970/10/13   DATE OF PROCEDURE:  01/16/2007  DATE OF DISCHARGE:                               OPERATIVE REPORT   PREOPERATIVE DIAGNOSIS:  Chronic pelvic pain.   POSTOPERATIVE DIAGNOSIS:  Stage I endometriosis.   PROCEDURE:  Diagnostic laparoscopy.   SURGEON:  Ginger Carne, M.D.   ASSISTANT:  None.   COMPLICATIONS:  None immediate.   ESTIMATED BLOOD LOSS:  Minimal.   SPECIMEN:  None.   OPERATIVE FINDINGS:  External genitalia, vulva and vagina normal.  Cervix smooth without erosions or lesions.  Laparoscopic evaluation  revealed stage I endometriosis with lesions located on the broad  ligaments bilaterally as well as the surface of the ovaries bilaterally.  No evidence of adhesive disease noted.  Large and small bowel grossly  normal.  No evidence of femoral, inguinal or obturator hernias.  Upper  abdomen normal. Posterior deformable buldging of the uterus consistent  with possible adenomyosis.   OPERATIVE PROCEDURE:  The patient prepped and draped in usual fashion  and placed lithotomy position.  Betadine solution used for antiseptic  and the patient was catheterized prior to procedure.  After adequate  general anesthesia, tenaculum placed on the anterior lip of the cervix  and a Hickman tenaculum in the endocervical canal.  Afterwards a  vertical infraumbilical incision was made.  The Veress needle placed in  the abdomen.  Opening closing pressures were 10-15 mmHg. Needle released  and trocar placed in same incision.  Laparoscope placed in trocar  sleeve.  Afterwards 5 mm port made under direct visualization.  Following this inspection of the pelvic and upper abdominal contents was  carried out.  Photography taken. Afterwards gas  released, trocars  removed.  Closure of the 10-mm fascia site with 0 Vicryl suture and 4-0  Vicryl for subcuticular closure.  Instrument and sponge count were  correct.  The patient tolerated the procedure well, returned post  anesthesia recovery room in excellent condition.      Ginger Carne, MD  Electronically Signed    SHB/MEDQ  D:  01/16/2007  T:  01/16/2007  Job:  [id number]

## 2010-10-22 NOTE — Op Note (Signed)
NAME:  [name], [name]                        ACCOUNT NO.:  [id number]   MEDICAL RECORD NO.:  [id number]                   PATIENT TYPE:  AMB   LOCATION:  DAY                                  FACILITY:  [facility]   PHYSICIAN:  Jene Every, M.D.                 DATE OF BIRTH:  1971-01-12   DATE OF PROCEDURE:  10/20/2003  DATE OF DISCHARGE:                                 OPERATIVE REPORT   PREOPERATIVE DIAGNOSIS:  Spinal stenosis, L5-S1 left.   POSTOPERATIVE DIAGNOSIS:  Spinal stenosis, L5-S1 left.   OPERATION PERFORMED:  Lateral recess decompression, foraminotomy of L5,  decompression of L5 and S1 nerve roots.  Evaluation of the L5-S1 disk.   SURGEON:  Jene Every, M.D.   ASSISTANT:  Roma Schanz, P.A.   ANESTHESIA:  General.   INDICATIONS FOR PROCEDURE:  The patient is a 40 year old with an L5  radiculopathy on the left, secondary to foraminal stenosis, small disk  protrusion.  Operative intervention was indicated for decompression of the  L5 nerve root.  She had a selective nerve root block that was temporarily  efficacious.  She had radicular symptoms into the great toe and mild  extensor hallucis longus weakness.  Operative intervention was indicated for  decompression of the L5 nerve root.  Risks and benefits were discussed  including bleeding, infection, damage to neurovascular structures, recurrent  disk herniation, need for revision etc.   DESCRIPTION OF PROCEDURE:  Patient in supine position.  After satisfactory  induction of general anesthesia, 1 g of Kefzol, she was placed prone on the  [place] frame.  All bony prominences were well padded.  The lumbar region  was prepped and draped in the usual sterile fashion.  Two 18 gauge spinal  needles were utilized to localize the 5-1 interspace confirmed with x-ray.  Incision was made on the spinous process of 5-1.  Subcutaneous tissue were  dissected.  Electrocautery utilized to achieve hemostasis.  Dorsal lumbar  fascia identified and divided in line with the skin incision.  Paraspinous  muscles elevated from the lamina of 5 and S1.  McCullough retractor was  placed.  Penfield 4 in the interlaminar space.  Lateral radiograph confirmed  the L5-S1 interlaminar space.  Operating microscope was draped and brought  in the surgical field.  High speed bur was utilized to perform a  hemilaminotomy at the caudad edge of 4.  This was identified as the superior  articulating process of S1.  This was undercut with a 2 then a 3 mm Kerrison  in a sense performing an L5 foraminotomy.  We detached the ligamentum flavum  from the cephalad edge of S1, the caudad edge of 5.  Hockey stick probe  placed in the foramina of 5 and found to be fairly stenotic.  We performed a  foraminotomy, undercut the superior articulating process of S1, removed the  ligamentum flavum from the infolded foramen.  This  decompressed the L5 nerve  root.  There was epidural venous plexus and bipolar electrocautery utilized  to achieve hemostasis.  Placed a hockey stick probe in the foramen of 5 and  S1 and they were found widely patent following the decompression.  Placed a  Penfield 4 just above the pedicle and probed the disk. There was a mild  bulging but no frank disk herniation requiring microdiskectomy.  Next  inspection revealed no CSF leakage or active bleeding.  The 5 root was fully  decompressed as was the S1 root.  Wound was copiously irrigated.  Thrombin  soaked Gelfoam was placed in the laminotomy defect. McCullough retractor  removed.  Paraspinous muscles inspected, no evidence of active bleeding.  The paraspinous fascia reapproximated with #1 Vicryl in figure-of-eight  sutures, subcutaneous tissue reapproximated with 2-0 Vicryl simple sutures.  Skin  was reapproximated with four subcuticular Prolene.  Wound was reinforced  Steri-Strips.  Sterile  dressing applied.  Placed supine on hospital bed,  extubated without difficulty and  transported to recovery in satisfactory  condition.  The patient tolerated the procedure well with no complications.                                               Jene Every, M.D.    Cordelia Pen  D:  10/20/2003  T:  10/20/2003  Job:  [id number]

## 2011-03-17 LAB — BASIC METABOLIC PANEL
BUN: 4 — ABNORMAL LOW
BUN: 5 — ABNORMAL LOW
BUN: 7
CO2: 28
CO2: 28
CO2: 30
Calcium: 9
Calcium: 9.1
Calcium: 9.2
Chloride: 103
Chloride: 103
Chloride: 104
Creatinine, Ser: 0.73
Creatinine, Ser: 0.75
Creatinine, Ser: 0.76
GFR calc Af Amer: >60
GFR calc Af Amer: >60
GFR calc Af Amer: >60
GFR calc non Af Amer: >60
GFR calc non Af Amer: >60
GFR calc non Af Amer: >60
Glucose, Bld: 143 — ABNORMAL HIGH
Glucose, Bld: 148 — ABNORMAL HIGH
Glucose, Bld: 169 — ABNORMAL HIGH
Potassium: 3.3 — ABNORMAL LOW
Potassium: 3.6
Potassium: 3.8
Sodium: 135
Sodium: 137
Sodium: 138

## 2011-03-17 LAB — CBC
HCT: 35.6 — ABNORMAL LOW
HCT: 33.9 — ABNORMAL LOW
HCT: 38
Hemoglobin: 12.3
Hemoglobin: 11.5 — ABNORMAL LOW
Hemoglobin: 13
MCHC: 34.5
MCHC: 33.8
MCHC: 34.2
MCV: 89.6
MCV: 90.7
MCV: 91
Platelets: 258
Platelets: 200
Platelets: 221
RBC: 3.97
RBC: 3.73 — ABNORMAL LOW
RBC: 4.19
RDW: 12.7
RDW: 12.7
RDW: 12.7
WBC: 10.1
WBC: 12.1 — ABNORMAL HIGH
WBC: 7.5

## 2011-03-17 LAB — HCG, SERUM, QUALITATIVE: Preg, Serum: NEGATIVE

## 2011-03-17 LAB — COMPREHENSIVE METABOLIC PANEL
ALT: 17
AST: 22
Albumin: 3.7
Alkaline Phosphatase: 70
BUN: 7
CO2: 26
Calcium: 9.3
Chloride: 103
Creatinine, Ser: 0.7
GFR calc Af Amer: >60
GFR calc non Af Amer: >60
Glucose, Bld: 134 — ABNORMAL HIGH
Potassium: 3.6
Sodium: 137
Total Bilirubin: 0.5
Total Protein: 7.1

## 2011-03-21 LAB — CBC
HCT: 39.7
Hemoglobin: 13.3
MCHC: 33.6
MCV: 90.7
Platelets: 204
RBC: 4.37
RDW: 12.9
WBC: 8.9

## 2011-03-21 LAB — BASIC METABOLIC PANEL
BUN: 6
CO2: 32
Calcium: 9.3
Chloride: 101
Creatinine, Ser: 0.7
GFR calc Af Amer: >60
GFR calc non Af Amer: >60
Glucose, Bld: 127 — ABNORMAL HIGH
Potassium: 3.3 — ABNORMAL LOW
Sodium: 137

## 2011-03-21 LAB — HCG, SERUM, QUALITATIVE: Preg, Serum: NEGATIVE

## 2011-05-05 ENCOUNTER — Ambulatory Visit: Payer: Self-pay | Admitting: *Deleted

## 2011-06-20 ENCOUNTER — Ambulatory Visit: Payer: Self-pay | Admitting: General Practice

## 2011-07-08 ENCOUNTER — Ambulatory Visit: Payer: Self-pay | Admitting: General Practice

## 2011-07-20 ENCOUNTER — Encounter: Payer: Self-pay | Admitting: Internal Medicine

## 2011-07-20 ENCOUNTER — Ambulatory Visit (INDEPENDENT_AMBULATORY_CARE_PROVIDER_SITE_OTHER): Payer: PRIVATE HEALTH INSURANCE | Admitting: Internal Medicine

## 2011-07-20 VITALS — BP 132/92 | HR 92 | Temp 98.6°F | Ht 65.5 in | Wt 213.0 lb

## 2011-07-20 DIAGNOSIS — IMO0002 Reserved for concepts with insufficient information to code with codable children: Secondary | ICD-10-CM | POA: Insufficient documentation

## 2011-07-20 DIAGNOSIS — E1165 Type 2 diabetes mellitus with hyperglycemia: Secondary | ICD-10-CM | POA: Insufficient documentation

## 2011-07-20 DIAGNOSIS — G4459 Other complicated headache syndrome: Secondary | ICD-10-CM

## 2011-07-20 DIAGNOSIS — E119 Type 2 diabetes mellitus without complications: Secondary | ICD-10-CM

## 2011-07-20 DIAGNOSIS — E669 Obesity, unspecified: Secondary | ICD-10-CM

## 2011-07-20 DIAGNOSIS — I10 Essential (primary) hypertension: Secondary | ICD-10-CM | POA: Insufficient documentation

## 2011-07-20 LAB — COMPREHENSIVE METABOLIC PANEL
ALT: 15 U/L (ref 0–35)
AST: 18 U/L (ref 0–37)
Albumin: 4.1 g/dL (ref 3.5–5.2)
Alkaline Phosphatase: 82 U/L (ref 39–117)
BUN: 10 mg/dL (ref 6–23)
CO2: 23 mEq/L (ref 19–32)
Calcium: 9 mg/dL (ref 8.4–10.5)
Chloride: 108 mEq/L (ref 96–112)
Creatinine, Ser: 0.7 mg/dL (ref 0.4–1.2)
GFR: 118.03 mL/min (ref >60.00)
Glucose, Bld: 116 mg/dL — ABNORMAL HIGH (ref 70–99)
Potassium: 3.5 mEq/L (ref 3.5–5.1)
Sodium: 138 mEq/L (ref 135–145)
Total Bilirubin: 0.8 mg/dL (ref 0.3–1.2)
Total Protein: 7.3 g/dL (ref 6.0–8.3)

## 2011-07-20 LAB — MICROALBUMIN / CREATININE URINE RATIO
Creatinine,U: 283.1 mg/dL
Microalb Creat Ratio: 1.7 mg/g (ref 0.0–30.0)
Microalb, Ur: 4.9 mg/dL — ABNORMAL HIGH (ref 0.0–1.9)

## 2011-07-20 LAB — HEMOGLOBIN A1C: Hgb A1c MFr Bld: 6.8 % — ABNORMAL HIGH (ref 4.6–6.5)

## 2011-07-20 MED ORDER — OXYCODONE-ACETAMINOPHEN 5-325 MG PO TABS: 1.0000 | ORAL_TABLET | Freq: Four times a day (QID) | ORAL | Status: DC | PRN

## 2011-07-20 MED ORDER — ONDANSETRON HCL 8 MG PO TABS: 8.0000 mg | ORAL_TABLET | Freq: Three times a day (TID) | ORAL | Status: AC | PRN

## 2011-07-20 MED ORDER — METOPROLOL SUCCINATE ER 50 MG PO TB24: 50.0000 mg | ORAL_TABLET | Freq: Every day | ORAL | Status: DC

## 2011-07-20 MED ORDER — ORPHENADRINE CITRATE ER 100 MG PO TB12: 100.0000 mg | ORAL_TABLET | Freq: Two times a day (BID) | ORAL | Status: AC

## 2011-07-20 MED ORDER — ALPRAZOLAM 0.5 MG PO TABS: 0.5000 mg | ORAL_TABLET | Freq: Every day | ORAL | Status: DC | PRN

## 2011-07-20 NOTE — Patient Instructions (Signed)
You can decrease your carbohydrate consumption by using joseph's flatbread or pita bread,  available at [employer] and [employer].  Also,  The protein shakes I use are called Carb Control by [employer] (2.5 net carbs)

## 2011-07-20 NOTE — Assessment & Plan Note (Signed)
With 13 lb wt loss in one month through dietary restrictions of starches..  Counselling given, a1c ordered and diabetic eye exam recommended.

## 2011-07-20 NOTE — Assessment & Plan Note (Signed)
Starting Toprol XL 50 mg daily for concurrent chronic headache syndrome.

## 2011-07-20 NOTE — Progress Notes (Signed)
Subjective:    Patient ID: [name], female    DOB: [birth year], 41 y.o.   MRN: [id number]  HPI Ms. 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 Chief complaint is several. She has a history of migraines aggravated by marital separation 2 yrs ago.  she has fatigue weight gain and lack of exercise due to her schedule. She edrives to  [facility] once a week,  Has 2 children ,  5 yr old dtr and 79 yr old son.   there are some emotional stressors at home given her recent separation and her daughter becoming distraught over her father had having a girlfriend. Patient's mother is helping with childcare on the nights she has to.  Her headache history has not been thoroughly evaluated. She lost her insurance for 2 years which prevent her from sleeping seeing someone at the headache clinic for evaluation.  she is currently having 3-4 migraine headaches per month but has a low-grade headache every day. She is a chronic ibuprofen use and estimates that she takes about 100 ibuprofen tablets per month.  There is a history of a prior trial of propanalol for migraines and headaches which helped somewhat but not completely. She also had a brief trial of Topamax and is not sure where side effect was from it. She takes zofran for nausea. She drinks 84 to 120 ounces of water.  She has noted some snoring with really tired state but wakes up  feeling refreshed  and denies hypersomnia during day.  Headache that occurs daily is centeal and behind the eyes.  Next complaint is recent diagnosis of diabetes mellitus which occurred 1 month ago,  after receiving a  HgbA1c  of 6.7 during a recent place draining clinic she has been attending  diabetes education  classes and  has achieved a 13 lb wt loss in one month.   her blood sugars have been averaging   129 fasting,  Post prandial < 120. \ Past Medical History  Diagnosis Date  . Diabetes mellitus 2011    diet controlled,  gestational   No current outpatient prescriptions on file prior to visit.     Review of Systems  Neurological: Positive for headaches.       Objective:   Physical Exam  Constitutional: She is oriented to person, place, and time. She appears well-developed and well-nourished.  HENT:  Mouth/Throat: Oropharynx is clear and moist.  Eyes: EOM are normal. Pupils are equal, round, and reactive to light. No scleral icterus.  Neck: Normal range of motion. Neck supple. No JVD present. No thyromegaly present.  Cardiovascular: Normal rate, regular rhythm, normal heart sounds and intact distal pulses.   Pulmonary/Chest: Effort normal and breath sounds normal.  Abdominal: Soft. Bowel sounds are normal. She exhibits no mass. There is no tenderness.  Musculoskeletal: Normal range of motion. She exhibits no edema.  Lymphadenopathy:    She has no cervical adenopathy.  Neurological: She is alert and oriented to person, place, and time.  Skin: Skin is warm and dry.  Psychiatric: She has a normal mood and affect.          Assessment & Plan:   Diabetes mellitus type 2, controlled With 13 lb wt loss in one month through dietary restrictions of starches..  Counselling given, a1c ordered and diabetic eye  exam recommended.   Obesity (BMI 30-39.9) She is losing weight rapidly with dietary restrictions.  Exercise and low glycemic index diet discussed.   Hypertension goal BP (blood pressure) < 130/80 Starting Toprol XL 50 mg daily for concurrent chronic headache syndrome.   Headache syndrome, complicated She is a complicated headache picture which includes frequent migraines as well as medication rebound headaches which occur daily. We discussed stopping all NSAIDs and starting her on metoprolol at bedtime. She is willing to resume a trial of Topamax if  indicated.    Updated Medication List Outpatient Encounter Prescriptions as of 07/20/2011  Medication Sig Dispense Refill  . albuterol (PROVENTIL HFA;VENTOLIN HFA) 108 (90 BASE) MCG/ACT inhaler One puff as needed      . ALPRAZolam (XANAX) 0.5 MG tablet Take 1 tablet (0.5 mg total) by mouth daily as needed for sleep. Take one by mouth as needed  30 tablet  1  . Omega-3 Fatty Acids (FISH OIL) 1000 MG CAPS Take 2 by mouth twice a day.      . oxyCODONE-acetaminophen (PERCOCET) 5-325 MG per tablet Take 1 tablet by mouth every 6 (six) hours as needed for pain. Take one by mouth as needed  30 tablet  0  . DISCONTD: ALPRAZolam (XANAX) 0.5 MG tablet Take by mouth. Take one by mouth as needed      . DISCONTD: oxyCODONE-acetaminophen (PERCOCET) 5-325 MG per tablet Take one by mouth as needed      . DISCONTD: propranolol (INDERAL) 80 MG tablet Take one by mouth daily      . metoprolol succinate (TOPROL-XL) 50 MG 24 hr tablet Take 1 tablet (50 mg total) by mouth daily. Take with or immediately following a meal.  90 tablet  3  . ondansetron (ZOFRAN) 8 MG tablet Take 1 tablet (8 mg total) by mouth every 8 (eight) hours as needed for nausea.  30 tablet  1  . orphenadrine (NORFLEX) 100 MG tablet Take 1 tablet (100 mg total) by mouth 2 (two) times daily.  60 tablet  1

## 2011-07-20 NOTE — Assessment & Plan Note (Signed)
She is losing weight rapidly with dietary restrictions.  Exercise and low glycemic index diet discussed.

## 2011-07-21 NOTE — Assessment & Plan Note (Signed)
She is a complicated headache picture which includes frequent migraines as well as medication rebound headaches which occur daily. We discussed stopping all NSAIDs and starting her on metoprolol at bedtime. She is willing to resume a trial of Topamax if indicated.

## 2011-09-14 ENCOUNTER — Telehealth: Payer: Self-pay | Admitting: *Deleted

## 2011-09-14 NOTE — Telephone Encounter (Signed)
Patient sent you this my chart message, see below.

## 2011-09-14 NOTE — Telephone Encounter (Signed)
Message sent to pateint via mychart

## 2011-09-14 NOTE — Telephone Encounter (Signed)
Message copied by Jobie Quaker on Wed Sep 14, 2011  3:03 PM ------      Message from: Thomasena Edis      Created: Wed Sep 14, 2011  1:02 PM       Caller: [name]; Phone Number: [phone]; Message from caller: Patient states she is still having migraines and wants to try another medication.

## 2011-09-16 ENCOUNTER — Telehealth: Payer: Self-pay | Admitting: *Deleted

## 2011-09-16 NOTE — Telephone Encounter (Signed)
Triage Record Num: [id number] Operator: Alphonsa Overall Patient Name: [name] Call Date & Time: 09/15/2011 5:58:17PM Patient Phone: [phone] PCP: Duncan Dull Patient Gender: Female PCP Fax : [phone] Patient DOB: 1970-11-04 Practice Name: [facility] Reason for Call: Caller: Lametria/Patient; PCP: Duncan Dull; CB#: [phone]; ; ; Call regarding Headaches Hysterectomy pt. [name] calling about headache. Ongoing x 3 days. Pt with hx of migraines. Afebrile.Pain improving 09/15/11, but still there. Pt having weekly migraines 8 out of 10 pain level. Metoprolol, Xanax,Norflex not helping. Pt willing to try Topamax. Needs to be able to work with medication choice. Pt to f/u in am with office about med change. Aware needs evaluation tonight if needs pain management now. Protocol(s) Used: Headache Recommended Outcome per Protocol: See Provider within 4 hours Reason for Outcome: New tenderness over temporal area Care Advice: ~ Another adult should drive. ~ Do not take aspirin for headache until discussing with your provider. Call EMS 911 if any of the following develop: sudden changes in vision including blindness or double vision; new confusion or disorientation; any difficulty speaking, difficulty walking or using an arm or leg, severe numbness on one side of the face, body or any extremities. ~ ~ IMMEDIATE ACTION Write down provider's name. List or place the following in a bag for transport with the patient: current prescription and/or nonprescription medications; alternative treatments, therapies and medications; and street drugs. ~ 04/        Tried calling patient, but was unable to reach her. Left her a message to call the office.

## 2011-09-21 ENCOUNTER — Telehealth: Payer: Self-pay | Admitting: Internal Medicine

## 2011-09-22 NOTE — Telephone Encounter (Signed)
I tried calling patient but number given had been disconnected.  I have not received a message for patient or a phone call/voicemail.  She has an appt on Monday.

## 2011-09-22 NOTE — Telephone Encounter (Signed)
[facility] Triage Call Report Triage Record Num: [id number] Operator: Ether Griffins Patient Name: [name] Call Date & Time: 09/21/2011 4:57:02PM Patient Phone: [phone] PCP: Duncan Dull Patient Gender: Female PCP Fax : [phone] Patient DOB: 21-Jul-1970 Practice Name: [facility] Reason for Call: Caller: Sada/Patient; PCP: Duncan Dull; CB#: [phone]; Calling about headache.Onset 09/20/11.Had headache everyday last week and went away on 4/13; then came back yesterday.Went to chiropractor without effect.Hx of migraines.Dr Darrick Huntsman advised her to call if headaches came back, and she would start her on Topramax.Pt has been calling everyday and no one from office has called her back--she is very angry.Offered to schedule an appt for pt and she agreed.Appt scheduled on 09/26/11 @ 0815 with Dr.Tullo. Protocol(s) Used: Office Note Recommended Outcome per Protocol: Information Noted and Sent to Office Reason for Outcome: Caller information to office Care Advice: ~ 09/21/2011 5:10:11PM Page 1 of 1 [email or web address]

## 2011-09-26 ENCOUNTER — Ambulatory Visit (INDEPENDENT_AMBULATORY_CARE_PROVIDER_SITE_OTHER): Payer: PRIVATE HEALTH INSURANCE | Admitting: Internal Medicine

## 2011-09-26 ENCOUNTER — Encounter: Payer: Self-pay | Admitting: Internal Medicine

## 2011-09-26 VITALS — BP 122/74 | HR 62 | Temp 98.7°F | Resp 14 | Wt 212.5 lb

## 2011-09-26 DIAGNOSIS — G4459 Other complicated headache syndrome: Secondary | ICD-10-CM

## 2011-09-26 DIAGNOSIS — E669 Obesity, unspecified: Secondary | ICD-10-CM

## 2011-09-26 DIAGNOSIS — R519 Headache, unspecified: Secondary | ICD-10-CM | POA: Insufficient documentation

## 2011-09-26 DIAGNOSIS — I10 Essential (primary) hypertension: Secondary | ICD-10-CM

## 2011-09-26 MED ORDER — TOPIRAMATE 25 MG PO TABS: 25.0000 mg | ORAL_TABLET | Freq: Every day | ORAL | Status: DC

## 2011-09-26 MED ORDER — ZOLMITRIPTAN 5 MG NA SOLN: 1.0000 | NASAL | Status: DC | PRN

## 2011-09-26 NOTE — Assessment & Plan Note (Addendum)
No significant improvement in daily headache syndrome  with trial of low dose Toprol.  Will add topomax 25 mg at bedtime.  Prior tolerance to imitrex, so I have given her samples of trial of zomig nasal spray.

## 2011-09-26 NOTE — Assessment & Plan Note (Signed)
Will continue Toprol for bp management.

## 2011-09-26 NOTE — Progress Notes (Signed)
Patient ID: [name], female   DOB: 08-Nov-1970, 41 y.o.   MRN: [id number]   Patient Active Problem List  Diagnoses  . Headache syndrome, complicated  . Diabetes mellitus type 2, controlled  . Obesity (BMI 30-39.9)  . Hypertension goal BP (blood pressure) < 130/80  . Headache disorder    Subjective:  CC:   Chief Complaint  Patient presents with  . Headache    HPI:   [name] a 41 y.o. female who presents for follow up on headaches..  She was seen about two months ago and at that time was advised to stop sing daily ibuprofen and tylenol, which she has done.  She has been taking toprol 25 mg daily since her last visit.  She has had no significant change in headaches. The headaches continue to occur daily and are tolerable but occasionally has one that is intense and  accompanied by nausea, lasts 3 or 4 days,  She has averaged one incapacitating headache per month, for which she uses zofran, percocet, xanax and muscle relaxer. She used to use imitrex for severe headaches , but she had an episodes of chest pain when her prior PCP switched her to Maxalt that resulted in an ER evaluation.  She has not used a triptan since. She is interested in resuming a Trial of topomax if it will help.    Past Medical History  Diagnosis Date  . Diabetes mellitus 2011    diet controlled,  gestational  . Headache disorder     Past Surgical History  Procedure Date  . Hernia repair 2003    left inguinal   . Breast surgery     right breast x 2 , benign  . Tubal ligation   . Abdominal hysterectomy 2006    heavy menses, endometriosis, l oophrectomy  . Left oophorectomy          The following portions of the patient's history were reviewed and updated as appropriate: Allergies, current medications, and problem list.    Review of Systems:   12 Pt  review of systems was negative except those addressed in the HPI,     History   Social History  . Marital Status: Legally  Separated    Spouse Name: N/A    Number of Children: N/A  . Years of Education: N/A   Occupational History  . Not on file.   Social History Main Topics  . Smoking status: Never Smoker   . Smokeless tobacco: Never Used  . Alcohol Use: No  . Drug Use: No  . Sexually Active: Not on file   Other Topics Concern  . Not on file   Social History Narrative  . No narrative on file    Objective:  BP 122/74  Pulse 62  Temp(Src) 98.7 F (37.1 C) (Oral)  Resp 14  Wt 212 lb 8 oz (96.389 kg)  SpO2 100%  General appearance: alert, cooperative and appears stated age Ears: normal TM's and external ear canals both ears Throat: lips, mucosa, and tongue normal; teeth and gums normal Neck: no adenopathy, no carotid bruit, supple, symmetrical, trachea midline and thyroid not enlarged, symmetric, no tenderness/mass/nodules Back: symmetric, no curvature. ROM normal. No CVA tenderness. Lungs: clear to auscultation bilaterally Heart: regular rate and rhythm, S1, S2 normal, no murmur, click, rub or gallop Abdomen: soft, non-tender; bowel sounds normal; no masses,  no organomegaly Pulses: 2+ and symmetric Skin: Skin color, texture, turgor normal. No rashes or lesions Lymph nodes: Cervical,  supraclavicular, and axillary nodes normal.  Assessment and Plan:  Headache syndrome, complicated No significant improvement in daily headache syndrome  with trial of low dose Toprol.  Will add topomax 25 mg at bedtime.  Prior tolerance to imitrex, so I have given her samples of trial of zomig nasal spray.   Hypertension goal BP (blood pressure) < 130/80 Will continue Toprol for bp management.   Obesity (BMI 30-39.9) Weight has plateaued and her BMI remains > 30.  She is walking with her 22 yr old daughter twice weekly.  I have addressed  BMI and recommended a low glycemic index diet utilizing smaller more frequent meals to increase metabolism.  I have also recommended that patient increase her exercising  with a goal of 30 minutes of aerobic exercise a minimum of 5 days per week.       Updated Medication List Outpatient Encounter Prescriptions as of 09/26/2011  Medication Sig Dispense Refill  . albuterol (PROVENTIL HFA;VENTOLIN HFA) 108 (90 BASE) MCG/ACT inhaler One puff as needed      . ALPRAZolam (XANAX) 0.5 MG tablet Take 1 tablet (0.5 mg total) by mouth daily as needed for sleep. Take one by mouth as needed  30 tablet  1  . metoprolol succinate (TOPROL-XL) 50 MG 24 hr tablet Take 1 tablet (50 mg total) by mouth daily. Take with or immediately following a meal.  90 tablet  3  . Omega-3 Fatty Acids (FISH OIL) 1000 MG CAPS Take 2 by mouth twice a day.      . ondansetron (ZOFRAN-ODT) 4 MG disintegrating tablet Take 4 mg by mouth every 8 (eight) hours as needed.      [name] oxyCODONE-acetaminophen (PERCOCET) 5-325 MG per tablet Take 1 tablet by mouth every 6 (six) hours as needed for pain. Take one by mouth as needed  30 tablet  0  . topiramate (TOPAMAX) 25 MG tablet Take 1 tablet (25 mg total) by mouth at bedtime.  30 tablet  1  . zolmitriptan (ZOMIG) 5 MG nasal solution Place 1 spray into the nose as needed for migraine.  2 Units  0     Orders Placed This Encounter  Procedures  . MyChart Weight Flowsheet    No Follow-up on file.

## 2011-09-26 NOTE — Assessment & Plan Note (Signed)
Weight has plateaued and her BMI remains > 30.  She is walking with her 42 yr old daughter twice weekly.  I have addressed  BMI and recommended a low glycemic index diet utilizing smaller more frequent meals to increase metabolism.  I have also recommended that patient increase her exercising with a goal of 30 minutes of aerobic exercise a minimum of 5 days per week.

## 2011-10-17 ENCOUNTER — Ambulatory Visit: Payer: PRIVATE HEALTH INSURANCE | Admitting: Internal Medicine

## 2011-10-18 LAB — HM DIABETES EYE EXAM

## 2011-10-20 ENCOUNTER — Ambulatory Visit (INDEPENDENT_AMBULATORY_CARE_PROVIDER_SITE_OTHER): Payer: PRIVATE HEALTH INSURANCE | Admitting: Obstetrics & Gynecology

## 2011-10-20 ENCOUNTER — Encounter: Payer: Self-pay | Admitting: Obstetrics & Gynecology

## 2011-10-20 VITALS — BP 141/100 | HR 75 | Ht 66.0 in | Wt 208.0 lb

## 2011-10-20 DIAGNOSIS — Z90721 Acquired absence of ovaries, unilateral: Secondary | ICD-10-CM

## 2011-10-20 DIAGNOSIS — Z113 Encounter for screening for infections with a predominantly sexual mode of transmission: Secondary | ICD-10-CM

## 2011-10-20 DIAGNOSIS — Z9079 Acquired absence of other genital organ(s): Secondary | ICD-10-CM

## 2011-10-20 DIAGNOSIS — Z01419 Encounter for gynecological examination (general) (routine) without abnormal findings: Secondary | ICD-10-CM

## 2011-10-20 DIAGNOSIS — Z90710 Acquired absence of both cervix and uterus: Secondary | ICD-10-CM

## 2011-10-20 NOTE — Patient Instructions (Addendum)
Return to clinic for any scheduled appointments or for any gynecologic concerns as needed.   Preventive Care for Adults, Female A healthy lifestyle and preventive care can promote health and wellness. Preventive health guidelines for women include the following key practices.  A routine yearly physical is a good way to check with your caregiver about your health and preventive screening. It is a chance to share any concerns and updates on your health, and to receive a thorough exam.   Visit your dentist for a routine exam and preventive care every 6 months. Brush your teeth twice a day and floss once a day. Good oral hygiene prevents tooth decay and gum disease.   The frequency of eye exams is based on your age, health, family medical history, use of contact lenses, and other factors. Follow your caregiver's recommendations for frequency of eye exams.   Eat a healthy diet. Foods like vegetables, fruits, whole grains, low-fat dairy products, and lean protein foods contain the nutrients you need without too many calories. Decrease your intake of foods high in solid fats, added sugars, and salt. Eat the right amount of calories for you.Get information about a proper diet from your caregiver, if necessary.   Regular physical exercise is one of the most important things you can do for your health. Most adults should get at least 150 minutes of moderate-intensity exercise (any activity that increases your heart rate and causes you to sweat) each week. In addition, most adults need muscle-strengthening exercises on 2 or more days a week.   Maintain a healthy weight. The body mass index (BMI) is a screening tool to identify possible weight problems. It provides an estimate of body fat based on height and weight. Your caregiver can help determine your BMI, and can help you achieve or maintain a healthy weight.For adults 20 years and older:   A BMI below 18.5 is considered underweight.   A BMI of 18.5  to 24.9 is normal.   A BMI of 25 to 29.9 is considered overweight.   A BMI of 30 and above is considered obese.   Maintain normal blood lipids and cholesterol levels by exercising and minimizing your intake of saturated fat. Eat a balanced diet with plenty of fruit and vegetables. Blood tests for lipids and cholesterol should begin at age 22 and be repeated every 5 years. If your lipid or cholesterol levels are high, you are over 50, or you are at high risk for heart disease, you may need your cholesterol levels checked more frequently.Ongoing high lipid and cholesterol levels should be treated with medicines if diet and exercise are not effective.   If you smoke, find out from your caregiver how to quit. If you do not use tobacco, do not start.   Avoid use of street drugs. Do not share needles with anyone. Ask for help if you need support or instructions about stopping the use of drugs.   High blood pressure causes heart disease and increases the risk of stroke. Your blood pressure should be checked at least every 1 to 2 years. Ongoing high blood pressure should be treated with medicines if weight loss and exercise are not effective.   Diabetes screening involves taking a blood sample to check your fasting blood sugar level. This should be done once every 3 years, after age 74, if you are within normal weight and without risk factors for diabetes. Testing should be considered at a younger age or be carried out more frequently if  you are overweight and have at least 1 risk factor for diabetes.   Breast cancer screening is essential preventive care for women. You should practice "breast self-awareness." This means understanding the normal appearance and feel of your breasts and may include breast self-examination. Any changes detected, no matter how small, should be reported to a caregiver. Women in their 101s and 30s should have a clinical breast exam (CBE) by a caregiver as part of a regular health  exam every 1 to 3 years. After age 36, women should have a CBE every year. Starting at age 110, women should consider having a mammography (breast X-ray test) every year. Women who have a family history of breast cancer should talk to their caregiver about genetic screening. Women at a high risk of breast cancer should talk to their caregivers about having magnetic resonance imaging (MRI) and a mammography every year.   If you had a hysterectomy for a problem that was not cancer or a condition that could lead to cancer, then you no longer need Pap tests. Even if you no longer need a Pap test, a regular exam is a good idea to make sure no other problems are starting.   Colorectal cancer can be detected and often prevented. Most routine colorectal cancer screening begins at the age of 77 and continues through age 37. However, your caregiver may recommend screening at an earlier age if you have risk factors for colon cancer. On a yearly basis, your caregiver may provide home test kits to check for hidden blood in the stool. Use of a small camera at the end of a tube, to directly examine the colon (sigmoidoscopy or colonoscopy), can detect the earliest forms of colorectal cancer. Talk to your caregiver about this at age 68, when routine screening begins. Direct examination of the colon should be repeated every 5 to 10 years through age 3, unless early forms of pre-cancerous polyps or small growths are found.   Hepatitis C blood testing is recommended for all people born from [birth year] through 1965 and any individual with known risks for hepatitis C.   Practice safe sex. Use condoms and avoid high-risk sexual practices to reduce the spread of sexually transmitted infections (STIs). STIs include gonorrhea, chlamydia, syphilis, trichomonas, herpes, HPV, and human immunodeficiency virus (HIV). Herpes, HIV, and HPV are viral illnesses that have no cure. They can result in disability, cancer, and death. Sexually active  women aged 76 and younger should be checked for chlamydia. Older women with new or multiple partners should also be tested for chlamydia. Testing for other STIs is recommended if you are sexually active and at increased risk.   Osteoporosis is a disease in which the bones lose minerals and strength with aging. This can result in serious bone fractures. The risk of osteoporosis can be identified using a bone density scan. Women ages 77 and over and women at risk for fractures or osteoporosis should discuss screening with their caregivers. Ask your caregiver whether you should take a calcium supplement or vitamin D to reduce the rate of osteoporosis.   Menopause can be associated with physical symptoms and risks. Hormone replacement therapy is available to decrease symptoms and risks. You should talk to your caregiver about whether hormone replacement therapy is right for you.   Use sunscreen with sun protection factor (SPF) of 30 or more. Apply sunscreen liberally and repeatedly throughout the day. You should seek shade when your shadow is shorter than you. Protect yourself by wearing long  sleeves, pants, a wide-brimmed hat, and sunglasses year round, whenever you are outdoors.   Once a month, do a whole body skin exam, using a mirror to look at the skin on your back. Notify your caregiver of new moles, moles that have irregular borders, moles that are larger than a pencil eraser, or moles that have changed in shape or color.   Stay current with required immunizations.   Influenza. You need a dose every fall (or winter). The composition of the flu vaccine changes each year, so being vaccinated once is not enough.   Pneumococcal polysaccharide. You need 1 to 2 doses if you smoke cigarettes or if you have certain chronic medical conditions. You need 1 dose at age 54 (or older) if you have never been vaccinated.   Tetanus, diphtheria, pertussis (Tdap, Td). Get 1 dose of Tdap vaccine if you are younger  than age 72, are over 55 and have contact with an infant, are a Research scientist (physical sciences), are pregnant, or simply want to be protected from whooping cough. After that, you need a Td booster dose every 10 years. Consult your caregiver if you have not had at least 3 tetanus and diphtheria-containing shots sometime in your life or have a deep or dirty wound.   HPV. You need this vaccine if you are a woman age 30 or younger. The vaccine is given in 3 doses over 6 months.   Measles, mumps, rubella (MMR). You need at least 1 dose of MMR if you were born in 1957 or later. You may also need a second dose.   Meningococcal. If you are age 20 to 36 and a first-year college student living in a residence hall, or have one of several medical conditions, you need to get vaccinated against meningococcal disease. You may also need additional booster doses.   Zoster (shingles). If you are age 61 or older, you should get this vaccine.   Varicella (chickenpox). If you have never had chickenpox or you were vaccinated but received only 1 dose, talk to your caregiver to find out if you need this vaccine.   Hepatitis A. You need this vaccine if you have a specific risk factor for hepatitis A virus infection or you simply wish to be protected from this disease. The vaccine is usually given as 2 doses, 6 to 18 months apart.   Hepatitis B. You need this vaccine if you have a specific risk factor for hepatitis B virus infection or you simply wish to be protected from this disease. The vaccine is given in 3 doses, usually over 6 months.  Preventive Services / Frequency Ages 81 to 51  Blood pressure check.** / Every 1 to 2 years.   Lipid and cholesterol check.** / Every 5 years beginning at age 13.   Clinical breast exam.** / Every year after age 27.   Mammogram.** / Every year beginning at age 33 and continuing for as long as you are in good health. Consult with your caregiver.   Fecal occult blood test (FOBT) of stool. /  Every year beginning at age 75 and continuing until age 59. You may not need to do this test if you get a colonoscopy every 10 years.   Flexible sigmoidoscopy or colonoscopy.** / Every 5 years for a flexible sigmoidoscopy or every 10 years for a colonoscopy beginning at age 48 and continuing until age 56.   Hepatitis C blood test.** / For all people born from [birth year] through 1965 and any individual with  known risks for hepatitis C.   Skin self-exam. / Monthly.   Influenza immunization.** / Every year.   Pneumococcal polysaccharide immunization.** / 1 to 2 doses if you smoke cigarettes or if you have certain chronic medical conditions.   Tetanus, diphtheria, pertussis (Tdap, Td) immunization.** / A one-time dose of Tdap vaccine. After that, you need a Td booster dose every 10 years.   Measles, mumps, rubella (MMR) immunization. / You need at least 1 dose of MMR if you were born in 1957 or later. You may also need a second dose.   Varicella immunization.** / Consult your caregiver.   Meningococcal immunization.** / Consult your caregiver.   Hepatitis A immunization.** / Consult your caregiver. 2 doses, 6 to 18 months apart.   Hepatitis B immunization.** / Consult your caregiver. 3 doses, usually over 6 months.  ** Family history and personal history of risk and conditions may change your caregiver's recommendations. Document Released: 07/19/2001 Document Revised: 05/12/2011 Document Reviewed: 10/18/2010 [facility] Patient Information 2012 [place], [place].

## 2011-10-20 NOTE — Progress Notes (Signed)
  Subjective:     [name] is a 41 y.o. female and is here for an annual gynecologic physical exam. The patient had a total abdomina hysterectomy and a left salpingo-oophorectomy in September 2008, by Dr. Blima Rich for chronic pelvic pain; there was no endometriosis on the pathology. She was last seen in 2010 for evaluation of right sided pelvic pain; she had a normal pelvic ultrasound and was advised to go for physical therapy as her pain were in her levator muscles.  Today, she reports  occasional mild right sided pain, no abnormal discharge or bleeding or any other concerns.  She desires STI scrren though she denies any recent sexual activity; separated from husband.  History   Social History  . Marital Status: Legally Separated    Spouse Name: N/A    Number of Children: N/A  . Years of Education: N/A   Occupational History  . Not on file.   Social History Main Topics  . Smoking status: Never Smoker   . Smokeless tobacco: Never Used  . Alcohol Use: No  . Drug Use: No  . Sexually Active: Not on file   Other Topics Concern  . Not on file   Social History Narrative  . No narrative on file   Health Maintenance  Topic Date Due  . Pap Smear  07/30/1987  . Tetanus/tdap  07/29/1988  . Influenza Vaccine  03/06/2012   The following portions of the patient's history were reviewed and updated as appropriate: allergies, current medications, past family history, past medical history, past social history, past surgical history and problem list.  Review of Systems Pertinent items are noted in HPI.   Objective:     Blood pressure 141/100, pulse 75, height 5\' 6"  (1.676 m), weight 208 lb (94.348 kg). GENERAL: Well-developed, well-nourished female in no acute distress.  HEENT: Normocephalic, atraumatic. Sclerae anicteric.  NECK: Supple. Normal thyroid.  LUNGS: Clear to auscultation bilaterally.  HEART: Regular rate and rhythm. BREASTS: Symmetric with everted nipples. No  masses, skin changes, nipple drainage, or lymphadenopathy. ABDOMEN: Soft, nontender, nondistended. No organomegaly. Patient pointed to RLQ area as area of her pain, away from the adnexal region. PELVIC: Normal external female genitalia. Vagina is pink and rugated.  Normal vaginal cuff. No right adnexal mass or tenderness.  Samples obtained for GC/Chlam and wet prep cultures. EXTREMITIES: No cyanosis, clubbing, or edema, 2+ distal pulses.    Assessment:    Healthy female exam s/p hysterectomy.  No need for pap smear as hysterectomy was performed for benign indications and had benign pathology.  Patient desires comprehensive STD screen.     Plan:   Comprehensive STI screen ordered Follow up STI screen and manage accordingly Advised to follow routine preventative health maintenance; follow up with PCP regarding elevated BP

## 2011-10-21 LAB — RPR

## 2011-10-21 LAB — WET PREP, GENITAL
Trich, Wet Prep: NONE SEEN
Yeast Wet Prep HPF POC: NONE SEEN

## 2011-10-21 LAB — GC/CHLAMYDIA PROBE AMP, GENITAL
Chlamydia, DNA Probe: NEGATIVE
GC Probe Amp, Genital: NEGATIVE

## 2011-10-21 LAB — HEPATITIS B SURFACE ANTIGEN: Hepatitis B Surface Ag: NEGATIVE

## 2011-10-21 LAB — HIV ANTIBODY (ROUTINE TESTING W REFLEX): HIV: NONREACTIVE

## 2011-10-21 LAB — HEPATITIS C ANTIBODY: HCV Ab: NEGATIVE

## 2011-11-29 ENCOUNTER — Encounter: Payer: Self-pay | Admitting: Nurse Practitioner

## 2011-11-29 ENCOUNTER — Ambulatory Visit (INDEPENDENT_AMBULATORY_CARE_PROVIDER_SITE_OTHER): Payer: PRIVATE HEALTH INSURANCE | Admitting: Nurse Practitioner

## 2011-11-29 VITALS — BP 131/99 | HR 78 | Ht 66.0 in | Wt 209.0 lb

## 2011-11-29 DIAGNOSIS — G43909 Migraine, unspecified, not intractable, without status migrainosus: Secondary | ICD-10-CM

## 2011-11-29 MED ORDER — ZONISAMIDE 100 MG PO CAPS: 100.0000 mg | ORAL_CAPSULE | Freq: Every day | ORAL | Status: DC

## 2011-11-29 NOTE — Progress Notes (Signed)
S: New migraine patient today. She has had migraine since she was 41yo. Has been seen by Neurologist in past and had recent MRI in 2011 which was negative. Denies any aura. Pain is in Right temple and top of head. Severe 3/ Moderate 4/ Mild daily. Her migraine can last up to 2-3 days.  In past she has taken Topamax, Inderal, Verapamil, Prozac. She has had an allergic rxn to Maxalt in past. Currently she is on Topamax 25mg  and it has side effects of tingling in her lips at 25mg . 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    General:NAD HEENT:negative Cardiac:RRR Lungs:Clear Neuro:Negative Skin:Warm and dry Muscles: Negative   A: Migraine without aura Obesity, DM, HTN,   P: Considering cardiac risk factors today she will discontinue Zomig Considering SE of Topamax will discontinue that as well and start her on Zonegran 100mg  daily Seek approval for Botox She will RTC 6 weeks or call prn

## 2012-04-06 LAB — HM DIABETES EYE EXAM: HM Diabetic Eye Exam: NORMAL

## 2012-05-08 ENCOUNTER — Ambulatory Visit: Payer: Self-pay | Admitting: Internal Medicine

## 2012-05-31 ENCOUNTER — Encounter: Payer: Self-pay | Admitting: Internal Medicine

## 2012-06-05 ENCOUNTER — Ambulatory Visit: Payer: PRIVATE HEALTH INSURANCE | Admitting: Nurse Practitioner

## 2012-06-14 ENCOUNTER — Emergency Department: Payer: Self-pay | Admitting: Emergency Medicine

## 2012-06-14 LAB — COMPREHENSIVE METABOLIC PANEL
Albumin: 4 g/dL (ref 3.4–5.0)
Alkaline Phosphatase: 101 U/L (ref 50–136)
Anion Gap: 4 — ABNORMAL LOW (ref 7–16)
BUN: 17 mg/dL (ref 7–18)
Bilirubin,Total: 0.3 mg/dL (ref 0.2–1.0)
Calcium, Total: 9 mg/dL (ref 8.5–10.1)
Chloride: 110 mmol/L — ABNORMAL HIGH (ref 98–107)
Co2: 27 mmol/L (ref 21–32)
Creatinine: 1.07 mg/dL (ref 0.60–1.30)
EGFR (African American): >60
EGFR (Non-African Amer.): >60
Glucose: 88 mg/dL (ref 65–99)
Osmolality: 282 (ref 275–301)
Potassium: 3.6 mmol/L (ref 3.5–5.1)
SGOT(AST): 17 U/L (ref 15–37)
SGPT (ALT): 19 U/L (ref 12–78)
Sodium: 141 mmol/L (ref 136–145)
Total Protein: 7.8 g/dL (ref 6.4–8.2)

## 2012-06-14 LAB — URINALYSIS, COMPLETE
Bilirubin,UR: NEGATIVE
Glucose,UR: NEGATIVE mg/dL (ref 0–75)
Ketone: NEGATIVE
Leukocyte Esterase: NEGATIVE
Nitrite: NEGATIVE
Ph: 7 (ref 4.5–8.0)
Protein: NEGATIVE
RBC,UR: 3 /HPF (ref 0–5)
Specific Gravity: 1.015 (ref 1.003–1.030)
Squamous Epithelial: 1
WBC UR: 1 /HPF (ref 0–5)

## 2012-06-14 LAB — CBC
HCT: 37.8 % (ref 35.0–47.0)
HGB: 12.7 g/dL (ref 12.0–16.0)
MCH: 30.3 pg (ref 26.0–34.0)
MCHC: 33.7 g/dL (ref 32.0–36.0)
MCV: 90 fL (ref 80–100)
Platelet: 172 10*3/uL (ref 150–440)
RBC: 4.21 10*6/uL (ref 3.80–5.20)
RDW: 13.3 % (ref 11.5–14.5)
WBC: 6.2 10*3/uL (ref 3.6–11.0)

## 2012-06-14 LAB — LIPASE, BLOOD: Lipase: 121 U/L (ref 73–393)

## 2012-07-03 ENCOUNTER — Encounter: Payer: Self-pay | Admitting: Nurse Practitioner

## 2012-07-03 ENCOUNTER — Ambulatory Visit (INDEPENDENT_AMBULATORY_CARE_PROVIDER_SITE_OTHER): Payer: 59 | Admitting: Nurse Practitioner

## 2012-07-03 VITALS — BP 150/99 | HR 75 | Ht 66.0 in | Wt 211.0 lb

## 2012-07-03 DIAGNOSIS — G43909 Migraine, unspecified, not intractable, without status migrainosus: Secondary | ICD-10-CM

## 2012-07-03 DIAGNOSIS — M5481 Occipital neuralgia: Secondary | ICD-10-CM

## 2012-07-03 DIAGNOSIS — M531 Cervicobrachial syndrome: Secondary | ICD-10-CM

## 2012-07-03 DIAGNOSIS — M62838 Other muscle spasm: Secondary | ICD-10-CM

## 2012-07-03 MED ORDER — ONABOTULINUMTOXINA 100 UNITS IJ SOLR: INTRAMUSCULAR | Status: DC

## 2012-07-03 MED ORDER — ISOMETHEPTENE-APAP-DICHLORAL 65-325-100 MG PO CAPS: 1.0000 | ORAL_CAPSULE | Freq: Four times a day (QID) | ORAL | Status: DC | PRN

## 2012-07-03 MED ORDER — ZONISAMIDE 100 MG PO CAPS: 100.0000 mg | ORAL_CAPSULE | Freq: Two times a day (BID) | ORAL | Status: DC

## 2012-07-03 NOTE — Patient Instructions (Signed)
Migraine Headache A migraine headache is an intense, throbbing pain on one or both sides of your head. A migraine can last for 30 minutes to several hours. CAUSES  The exact cause of a migraine headache is not always known. However, a migraine may be caused when nerves in the brain become irritated and release chemicals that cause inflammation. This causes pain. SYMPTOMS  Pain on one or both sides of your head.  Pulsating or throbbing pain.  Severe pain that prevents daily activities.  Pain that is aggravated by any physical activity.  Nausea, vomiting, or both.  Dizziness.  Pain with exposure to bright lights, loud noises, or activity.  General sensitivity to bright lights, loud noises, or smells. Before you get a migraine, you may get warning signs that a migraine is coming (aura). An aura may include:  Seeing flashing lights.  Seeing bright spots, halos, or zig-zag lines.  Having tunnel vision or blurred vision.  Having feelings of numbness or tingling.  Having trouble talking.  Having muscle weakness. MIGRAINE TRIGGERS  Alcohol.  Smoking.  Stress.  Menstruation.  Aged cheeses.  Foods or drinks that contain nitrates, glutamate, aspartame, or tyramine.  Lack of sleep.  Chocolate.  Caffeine.  Hunger.  Physical exertion.  Fatigue.  Medicines used to treat chest pain (nitroglycerine), birth control pills, estrogen, and some blood pressure medicines. DIAGNOSIS  A migraine headache is often diagnosed based on:  Symptoms.  Physical examination.  A CT scan or MRI of your head. TREATMENT Medicines may be given for pain and nausea. Medicines can also be given to help prevent recurrent migraines.  HOME CARE INSTRUCTIONS  Only take over-the-counter or prescription medicines for pain or discomfort as directed by your caregiver. The use of long-term narcotics is not recommended.  Lie down in a dark, quiet room when you have a migraine.  Keep a journal  to find out what may trigger your migraine headaches. For example, write down:  What you eat and drink.  How much sleep you get.  Any change to your diet or medicines.  Limit alcohol consumption.  Quit smoking if you smoke.  Get 7 to 9 hours of sleep, or as recommended by your caregiver.  Limit stress.  Keep lights dim if bright lights bother you and make your migraines worse. SEEK IMMEDIATE MEDICAL CARE IF:   Your migraine becomes severe.  You have a fever.  You have a stiff neck.  You have vision loss.  You have muscular weakness or loss of muscle control.  You start losing your balance or have trouble walking.  You feel faint or pass out.  You have severe symptoms that are different from your first symptoms. MAKE SURE YOU:   Understand these instructions.  Will watch your condition.  Will get help right away if you are not doing well or get worse. Document Released: 05/23/2005 Document Revised: 08/15/2011 Document Reviewed: 05/13/2011 [employer] Patient Information 2013 [employer].  

## 2012-07-03 NOTE — Progress Notes (Signed)
S: Pt is returning today for follow up with migraine headaches. She has not been back to the office since her original visit. She ran out of her Zonegran about 2 weeks ago. Since then she has had more migraines. She felt it worked well as a preventative but could have been better. She is agreeable to going up on the dose. She is also interested in Botox. She has had 4 severe migraines that have lasted 4 hours to 2 days. 8 moderate and daily mild's without zonegran. Her pain is in right temple and occipital regions and she would like a trigger point injection today. When she gets a severe migraine she takes a " cocktail " that includes zofran, 2 percocet, xanax, excedrin and 4 motrin. She has finished her BSN and will be persuing her DSN. She continues to work at [employer].  O: General : Alert, oriented NAD Muscle temples, traps, neck muscles on right side are all tight Nerves Occipital nerve tender tenderness  Procedure: Trigger Point Injections for Right Temple and Right Occipital/ 5cc total/ patient tolerated well. See procedure sheet.  A: Migraine Occipital neuralgia Muscle spasm  P: Restart Zonegran at 100mg  for one week the increase to 200mg  Trigger point injections today/ ice tonight Midrin as directed Advised against her migraine cocktail/ discussed not waiting till she got to that point Recommend Botox RTC asap with Botox

## 2012-07-09 ENCOUNTER — Telehealth: Payer: Self-pay | Admitting: *Deleted

## 2012-07-12 NOTE — Telephone Encounter (Signed)
Patient notified of Botox coverage/tn

## 2012-07-21 ENCOUNTER — Other Ambulatory Visit: Payer: Self-pay

## 2012-07-24 ENCOUNTER — Encounter: Payer: Self-pay | Admitting: Nurse Practitioner

## 2012-07-24 ENCOUNTER — Ambulatory Visit (INDEPENDENT_AMBULATORY_CARE_PROVIDER_SITE_OTHER): Payer: 59 | Admitting: Nurse Practitioner

## 2012-07-24 VITALS — BP 134/89 | HR 78 | Ht 66.0 in | Wt 209.0 lb

## 2012-07-24 DIAGNOSIS — G43909 Migraine, unspecified, not intractable, without status migrainosus: Secondary | ICD-10-CM

## 2012-07-24 DIAGNOSIS — G44209 Tension-type headache, unspecified, not intractable: Secondary | ICD-10-CM

## 2012-07-24 DIAGNOSIS — M542 Cervicalgia: Secondary | ICD-10-CM

## 2012-07-24 MED ORDER — KETOROLAC TROMETHAMINE 60 MG/2ML IM SOLN
60.0000 mg | Freq: Once | INTRAMUSCULAR | Status: AC
  Administered 2012-07-24: 60 mg via INTRAMUSCULAR

## 2012-07-24 MED ORDER — ZONISAMIDE 100 MG PO CAPS: 100.0000 mg | ORAL_CAPSULE | Freq: Three times a day (TID) | ORAL | Status: DC

## 2012-07-24 NOTE — Progress Notes (Signed)
S: Pt is in off today for Botox injection for migraine headache. She unfortunately has a migraine today. We will give her 60 mg of Toradol and trigger point injection in her right temple and proceed with the Botox injections. She has gone up on the Zonegran to 200mg  and not really seen a difference. She is agreeable to go up further. 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 She has taken 4 Midrin today. She is unable to take Triptans due to allergy and cardiac risks  O: Pt appears to have migraine, photophobic. Tenderness in right temple, bilateral traps  Procedure: Trigger point injections 5cc total in right temple. See Procedure sheet Botox 100 units Lot # [id number] Ex date 02/2015 Done in the usual [place] pattern. See Botox Procedure sheet  A: Migraine  P: Will give Toradol 60mg  now Will give Trigger Point Injection right temple now Will increase Zonegran to 300mg   Botox in usual patter/ patient tolerated well RTC 3 months or prn

## 2012-07-24 NOTE — Patient Instructions (Signed)
Migraine Headache A migraine headache is an intense, throbbing pain on one or both sides of your head. A migraine can last for 30 minutes to several hours. CAUSES  The exact cause of a migraine headache is not always known. However, a migraine may be caused when nerves in the brain become irritated and release chemicals that cause inflammation. This causes pain. SYMPTOMS  Pain on one or both sides of your head.  Pulsating or throbbing pain.  Severe pain that prevents daily activities.  Pain that is aggravated by any physical activity.  Nausea, vomiting, or both.  Dizziness.  Pain with exposure to bright lights, loud noises, or activity.  General sensitivity to bright lights, loud noises, or smells. Before you get a migraine, you may get warning signs that a migraine is coming (aura). An aura may include:  Seeing flashing lights.  Seeing bright spots, halos, or zig-zag lines.  Having tunnel vision or blurred vision.  Having feelings of numbness or tingling.  Having trouble talking.  Having muscle weakness. MIGRAINE TRIGGERS  Alcohol.  Smoking.  Stress.  Menstruation.  Aged cheeses.  Foods or drinks that contain nitrates, glutamate, aspartame, or tyramine.  Lack of sleep.  Chocolate.  Caffeine.  Hunger.  Physical exertion.  Fatigue.  Medicines used to treat chest pain (nitroglycerine), birth control pills, estrogen, and some blood pressure medicines. DIAGNOSIS  A migraine headache is often diagnosed based on:  Symptoms.  Physical examination.  A CT scan or MRI of your head. TREATMENT Medicines may be given for pain and nausea. Medicines can also be given to help prevent recurrent migraines.  HOME CARE INSTRUCTIONS  Only take over-the-counter or prescription medicines for pain or discomfort as directed by your caregiver. The use of long-term narcotics is not recommended.  Lie down in a dark, quiet room when you have a migraine.  Keep a journal  to find out what may trigger your migraine headaches. For example, write down:  What you eat and drink.  How much sleep you get.  Any change to your diet or medicines.  Limit alcohol consumption.  Quit smoking if you smoke.  Get 7 to 9 hours of sleep, or as recommended by your caregiver.  Limit stress.  Keep lights dim if bright lights bother you and make your migraines worse. SEEK IMMEDIATE MEDICAL CARE IF:   Your migraine becomes severe.  You have a fever.  You have a stiff neck.  You have vision loss.  You have muscular weakness or loss of muscle control.  You start losing your balance or have trouble walking.  You feel faint or pass out.  You have severe symptoms that are different from your first symptoms. MAKE SURE YOU:   Understand these instructions.  Will watch your condition.  Will get help right away if you are not doing well or get worse. Document Released: 05/23/2005 Document Revised: 08/15/2011 Document Reviewed: 05/13/2011 [employer] Patient Information 2013 [employer].  

## 2012-10-10 ENCOUNTER — Encounter: Payer: Self-pay | Admitting: General Practice

## 2012-10-15 ENCOUNTER — Telehealth: Payer: Self-pay | Admitting: *Deleted

## 2012-10-15 DIAGNOSIS — G43909 Migraine, unspecified, not intractable, without status migrainosus: Secondary | ICD-10-CM

## 2012-10-15 MED ORDER — ONABOTULINUMTOXINA 100 UNITS IJ SOLR: INTRAMUSCULAR | Status: DC

## 2012-10-15 NOTE — Telephone Encounter (Signed)
Patient needs [place] to call in refill of her Botox to [facility] pharmacy.  She will bring to the office on Oct 23, 2012 to be administered for Migraine headaches.

## 2012-10-23 ENCOUNTER — Ambulatory Visit: Payer: 59 | Admitting: Obstetrics & Gynecology

## 2012-10-23 ENCOUNTER — Encounter: Payer: 59 | Admitting: Nurse Practitioner

## 2012-11-04 ENCOUNTER — Encounter: Payer: Self-pay | Admitting: General Practice

## 2012-11-20 ENCOUNTER — Encounter: Payer: Self-pay | Admitting: Nurse Practitioner

## 2012-11-20 ENCOUNTER — Ambulatory Visit (INDEPENDENT_AMBULATORY_CARE_PROVIDER_SITE_OTHER): Payer: 59 | Admitting: Nurse Practitioner

## 2012-11-20 VITALS — BP 151/105 | HR 69 | Ht 66.0 in | Wt 206.0 lb

## 2012-11-20 DIAGNOSIS — G43709 Chronic migraine without aura, not intractable, without status migrainosus: Secondary | ICD-10-CM | POA: Insufficient documentation

## 2012-11-20 DIAGNOSIS — M62838 Other muscle spasm: Secondary | ICD-10-CM

## 2012-11-20 DIAGNOSIS — IMO0002 Reserved for concepts with insufficient information to code with codable children: Secondary | ICD-10-CM | POA: Insufficient documentation

## 2012-11-20 DIAGNOSIS — G43009 Migraine without aura, not intractable, without status migrainosus: Secondary | ICD-10-CM

## 2012-11-20 DIAGNOSIS — G43909 Migraine, unspecified, not intractable, without status migrainosus: Secondary | ICD-10-CM

## 2012-11-20 MED ORDER — ALPRAZOLAM 0.5 MG PO TABS: 0.5000 mg | ORAL_TABLET | Freq: Every day | ORAL | Status: DC | PRN

## 2012-11-20 MED ORDER — OXYCODONE-ACETAMINOPHEN 5-325 MG PO TABS: 1.0000 | ORAL_TABLET | Freq: Four times a day (QID) | ORAL | Status: DC | PRN

## 2012-11-20 NOTE — Progress Notes (Signed)
S: Pt here today for Botox injections. It has been  Feb since her last injections. She did notice an improvement after 3 weeks, her best time was in May. She has had 3 severe, 3 moderate and 0 milds. She has gone up on her Zonegran to 300mg . Her BP is elevated today due to migraine. Her BP at work is in the 120/70 range. She would like a trigger point injection today for pain in right temple. She has completed her BSN program and will begin her MSN-DSN program in fall.   O: M/S: Tenderness in right temple/ right occipital areas and Traps  Procedure: Trigger point injections 5CC total. 1cc Dexamethazone, 2cc lidocaine, 2cc marcaine. Injected at right temple and occipital regions. Botox 100 Units:  Lot # [id number]  Exp: Jan 2017 Given in the usual botox pattern with no complications  A: Migraine Muscle spasm  P: Botox 100 units Trigger Point Injections Refill Percocet/ Xanax for rescue Continue Zonegran RTC 3 months Recheck BP at work when headache is gone

## 2012-11-20 NOTE — Patient Instructions (Signed)
Migraine Headache A migraine headache is an intense, throbbing pain on one or both sides of your head. A migraine can last for 30 minutes to several hours. CAUSES  The exact cause of a migraine headache is not always known. However, a migraine may be caused when nerves in the brain become irritated and release chemicals that cause inflammation. This causes pain. SYMPTOMS  Pain on one or both sides of your head.  Pulsating or throbbing pain.  Severe pain that prevents daily activities.  Pain that is aggravated by any physical activity.  Nausea, vomiting, or both.  Dizziness.  Pain with exposure to bright lights, loud noises, or activity.  General sensitivity to bright lights, loud noises, or smells. Before you get a migraine, you may get warning signs that a migraine is coming (aura). An aura may include:  Seeing flashing lights.  Seeing bright spots, halos, or zig-zag lines.  Having tunnel vision or blurred vision.  Having feelings of numbness or tingling.  Having trouble talking.  Having muscle weakness. MIGRAINE TRIGGERS  Alcohol.  Smoking.  Stress.  Menstruation.  Aged cheeses.  Foods or drinks that contain nitrates, glutamate, aspartame, or tyramine.  Lack of sleep.  Chocolate.  Caffeine.  Hunger.  Physical exertion.  Fatigue.  Medicines used to treat chest pain (nitroglycerine), birth control pills, estrogen, and some blood pressure medicines. DIAGNOSIS  A migraine headache is often diagnosed based on:  Symptoms.  Physical examination.  A CT scan or MRI of your head. TREATMENT Medicines may be given for pain and nausea. Medicines can also be given to help prevent recurrent migraines.  HOME CARE INSTRUCTIONS  Only take over-the-counter or prescription medicines for pain or discomfort as directed by your caregiver. The use of long-term narcotics is not recommended.  Lie down in a dark, quiet room when you have a migraine.  Keep a journal  to find out what may trigger your migraine headaches. For example, write down:  What you eat and drink.  How much sleep you get.  Any change to your diet or medicines.  Limit alcohol consumption.  Quit smoking if you smoke.  Get 7 to 9 hours of sleep, or as recommended by your caregiver.  Limit stress.  Keep lights dim if bright lights bother you and make your migraines worse. SEEK IMMEDIATE MEDICAL CARE IF:   Your migraine becomes severe.  You have a fever.  You have a stiff neck.  You have vision loss.  You have muscular weakness or loss of muscle control.  You start losing your balance or have trouble walking.  You feel faint or pass out.  You have severe symptoms that are different from your first symptoms. MAKE SURE YOU:   Understand these instructions.  Will watch your condition.  Will get help right away if you are not doing well or get worse. Document Released: 05/23/2005 Document Revised: 08/15/2011 Document Reviewed: 05/13/2011 [employer] Patient Information 2014 [employer].  

## 2012-11-20 NOTE — Progress Notes (Signed)
Here today for Botox injections for headache control.

## 2012-11-21 ENCOUNTER — Other Ambulatory Visit: Payer: Self-pay | Admitting: *Deleted

## 2012-11-22 ENCOUNTER — Encounter: Payer: Self-pay | Admitting: Obstetrics & Gynecology

## 2012-11-22 ENCOUNTER — Ambulatory Visit (INDEPENDENT_AMBULATORY_CARE_PROVIDER_SITE_OTHER): Payer: 59 | Admitting: Obstetrics & Gynecology

## 2012-11-22 VITALS — BP 153/104 | HR 66 | Resp 16 | Ht 66.0 in | Wt 207.0 lb

## 2012-11-22 DIAGNOSIS — Z01419 Encounter for gynecological examination (general) (routine) without abnormal findings: Secondary | ICD-10-CM

## 2012-11-22 DIAGNOSIS — G43909 Migraine, unspecified, not intractable, without status migrainosus: Secondary | ICD-10-CM

## 2012-11-22 MED ORDER — KETOROLAC TROMETHAMINE 60 MG/2ML IM SOLN
60.0000 mg | Freq: Once | INTRAMUSCULAR | Status: AC
  Administered 2012-11-22: 60 mg via INTRAMUSCULAR

## 2012-11-22 MED ORDER — METOPROLOL SUCCINATE ER 50 MG PO TB24: 50.0000 mg | ORAL_TABLET | Freq: Every day | ORAL | Status: DC

## 2012-11-22 NOTE — Progress Notes (Signed)
Subjective:   [name] is a 42 y.o. [phone] female and is here for an annual gynecologic physical exam. The patient had a total abdominal hysterectomy and a left salpingo-oophorectomy in September 2008, by Dr. Blima Rich for chronic pelvic pain; there was no endometriosis on the pathology. She has a history of migraines, followed by Jannifer Rodney.  Reports having a severe migraine currently.  She denies any recent sexual activity, no other GYN concerns.   History    Social History   .  Marital Status:  Legally Separated     Spouse Name:  N/A     Number of Children:  N/A   .  Years of Education:  N/A    Occupational History   .  Not on file.    Social History Main Topics   .  Smoking status:  Never Smoker   .  Smokeless tobacco:  Never Used   .  Alcohol Use:  No   .  Drug Use:  No   .  Sexually Active:  Not on file    Other Topics  Concern   .  Not on file    Social History Narrative   .  No narrative on file    Health Maintenance   Topic  Date Due   .  Pap Smear  07/30/1987   .  Tetanus/tdap  07/29/1988   .  Influenza Vaccine  03/06/2012    The following portions of the patient's history were reviewed and updated as appropriate: allergies, current medications, past family history, past medical history, past social history, past surgical history and problem list.   Review of Systems  Pertinent items are noted in HPI.   Objective:   BP 153/104  Pulse 66  Resp 16  Ht 5\' 6"  (1.676 m)  Wt 207 lb (93.895 kg)  BMI 33.43 kg/m2 GENERAL: Well-developed, well-nourished female in no acute distress.  HEENT: Normocephalic, atraumatic. Sclerae anicteric.  NECK: Supple. Normal thyroid.  LUNGS: Clear to auscultation bilaterally.  HEART: Regular rate and rhythm.  BREASTS: Symmetric with everted nipples. No masses, skin changes, nipple drainage, or lymphadenopathy.  ABDOMEN: Soft, nontender, nondistended. No organomegaly.  PELVIC: Normal external female genitalia. Vagina  is pink and rugated. Normal vaginal cuff. No right adnexal mass or tenderness.  EXTREMITIES: No cyanosis, clubbing, or edema, 2+ distal pulses.   Assessment:   Healthy female exam s/p hysterectomy. No need for pap smear as hysterectomy was performed for benign indications and had benign pathology.   Plan:   Normal pelvic and breast exam, due for mammogram after 05/2012 Will follow up with Jannifer Rodney regarding migraine management; advised to take Percocet prn severe pain Advised to follow routine preventative health maintenance; follow up with PCP regarding elevated BP

## 2012-11-22 NOTE — Patient Instructions (Signed)
Preventive Care for Adults, Female A healthy lifestyle and preventive care can promote health and wellness. Preventive health guidelines for women include the following key practices.  A routine yearly physical is a good way to check with your caregiver about your health and preventive screening. It is a chance to share any concerns and updates on your health, and to receive a thorough exam.  Visit your dentist for a routine exam and preventive care every 6 months. Brush your teeth twice a day and floss once a day. Good oral hygiene prevents tooth decay and gum disease.  The frequency of eye exams is based on your age, health, family medical history, use of contact lenses, and other factors. Follow your caregiver's recommendations for frequency of eye exams.  Eat a healthy diet. Foods like vegetables, fruits, whole grains, low-fat dairy products, and lean protein foods contain the nutrients you need without too many calories. Decrease your intake of foods high in solid fats, added sugars, and salt. Eat the right amount of calories for you.Get information about a proper diet from your caregiver, if necessary.  Regular physical exercise is one of the most important things you can do for your health. Most adults should get at least 150 minutes of moderate-intensity exercise (any activity that increases your heart rate and causes you to sweat) each week. In addition, most adults need muscle-strengthening exercises on 2 or more days a week.  Maintain a healthy weight. The body mass index (BMI) is a screening tool to identify possible weight problems. It provides an estimate of body fat based on height and weight. Your caregiver can help determine your BMI, and can help you achieve or maintain a healthy weight.For adults 20 years and older:  A BMI below 18.5 is considered underweight.  A BMI of 18.5 to 24.9 is normal.  A BMI of 25 to 29.9 is considered overweight.  A BMI of 30 and above is  considered obese.  Maintain normal blood lipids and cholesterol levels by exercising and minimizing your intake of saturated fat. Eat a balanced diet with plenty of fruit and vegetables. Blood tests for lipids and cholesterol should begin at age 20 and be repeated every 5 years. If your lipid or cholesterol levels are high, you are over 50, or you are at high risk for heart disease, you may need your cholesterol levels checked more frequently.Ongoing high lipid and cholesterol levels should be treated with medicines if diet and exercise are not effective.  If you smoke, find out from your caregiver how to quit. If you do not use tobacco, do not start.  If you are pregnant, do not drink alcohol. If you are breastfeeding, be very cautious about drinking alcohol. If you are not pregnant and choose to drink alcohol, do not exceed 1 drink per day. One drink is considered to be 12 ounces (355 mL) of beer, 5 ounces (148 mL) of wine, or 1.5 ounces (44 mL) of liquor.  Avoid use of street drugs. Do not share needles with anyone. Ask for help if you need support or instructions about stopping the use of drugs.  High blood pressure causes heart disease and increases the risk of stroke. Your blood pressure should be checked at least every 1 to 2 years. Ongoing high blood pressure should be treated with medicines if weight loss and exercise are not effective.  If you are 55 to 43 years old, ask your caregiver if you should take aspirin to prevent strokes.  Diabetes   screening involves taking a blood sample to check your fasting blood sugar level. This should be done once every 3 years, after age 45, if you are within normal weight and without risk factors for diabetes. Testing should be considered at a younger age or be carried out more frequently if you are overweight and have at least 1 risk factor for diabetes.  Breast cancer screening is essential preventive care for women. You should practice "breast  self-awareness." This means understanding the normal appearance and feel of your breasts and may include breast self-examination. Any changes detected, no matter how small, should be reported to a caregiver. Women in their 20s and 30s should have a clinical breast exam (CBE) by a caregiver as part of a regular health exam every 1 to 3 years. After age 40, women should have a CBE every year. Starting at age 40, women should consider having a mammography (breast X-ray test) every year. Women who have a family history of breast cancer should talk to their caregiver about genetic screening. Women at a high risk of breast cancer should talk to their caregivers about having magnetic resonance imaging (MRI) and a mammography every year.  The Pap test is a screening test for cervical cancer. A Pap test can show cell changes on the cervix that might become cervical cancer if left untreated. A Pap test is a procedure in which cells are obtained and examined from the lower end of the uterus (cervix).  Women should have a Pap test starting at age 21.  Between ages 21 and 29, Pap tests should be repeated every 2 years.  Beginning at age 30, you should have a Pap test every 3 years as long as the past 3 Pap tests have been normal.  Some women have medical problems that increase the chance of getting cervical cancer. Talk to your caregiver about these problems. It is especially important to talk to your caregiver if a new problem develops soon after your last Pap test. In these cases, your caregiver may recommend more frequent screening and Pap tests.  The above recommendations are the same for women who have or have not gotten the vaccine for human papillomavirus (HPV).  If you had a hysterectomy for a problem that was not cancer or a condition that could lead to cancer, then you no longer need Pap tests. Even if you no longer need a Pap test, a regular exam is a good idea to make sure no other problems are  starting.  If you are between ages 65 and 70, and you have had normal Pap tests going back 10 years, you no longer need Pap tests. Even if you no longer need a Pap test, a regular exam is a good idea to make sure no other problems are starting.  If you have had past treatment for cervical cancer or a condition that could lead to cancer, you need Pap tests and screening for cancer for at least 20 years after your treatment.  If Pap tests have been discontinued, risk factors (such as a new sexual partner) need to be reassessed to determine if screening should be resumed.  The HPV test is an additional test that may be used for cervical cancer screening. The HPV test looks for the virus that can cause the cell changes on the cervix. The cells collected during the Pap test can be tested for HPV. The HPV test could be used to screen women aged 30 years and older, and should   be used in women of any age who have unclear Pap test results. After the age of 30, women should have HPV testing at the same frequency as a Pap test.  Colorectal cancer can be detected and often prevented. Most routine colorectal cancer screening begins at the age of 50 and continues through age 75. However, your caregiver may recommend screening at an earlier age if you have risk factors for colon cancer. On a yearly basis, your caregiver may provide home test kits to check for hidden blood in the stool. Use of a small camera at the end of a tube, to directly examine the colon (sigmoidoscopy or colonoscopy), can detect the earliest forms of colorectal cancer. Talk to your caregiver about this at age 50, when routine screening begins. Direct examination of the colon should be repeated every 5 to 10 years through age 75, unless early forms of pre-cancerous polyps or small growths are found.  Hepatitis C blood testing is recommended for all people born from 1945 through 1965 and any individual with known risks for hepatitis C.  Practice  safe sex. Use condoms and avoid high-risk sexual practices to reduce the spread of sexually transmitted infections (STIs). STIs include gonorrhea, chlamydia, syphilis, trichomonas, herpes, HPV, and human immunodeficiency virus (HIV). Herpes, HIV, and HPV are viral illnesses that have no cure. They can result in disability, cancer, and death. Sexually active women aged 25 and younger should be checked for chlamydia. Older women with new or multiple partners should also be tested for chlamydia. Testing for other STIs is recommended if you are sexually active and at increased risk.  Osteoporosis is a disease in which the bones lose minerals and strength with aging. This can result in serious bone fractures. The risk of osteoporosis can be identified using a bone density scan. Women ages 65 and over and women at risk for fractures or osteoporosis should discuss screening with their caregivers. Ask your caregiver whether you should take a calcium supplement or vitamin D to reduce the rate of osteoporosis.  Menopause can be associated with physical symptoms and risks. Hormone replacement therapy is available to decrease symptoms and risks. You should talk to your caregiver about whether hormone replacement therapy is right for you.  Use sunscreen with sun protection factor (SPF) of 30 or more. Apply sunscreen liberally and repeatedly throughout the day. You should seek shade when your shadow is shorter than you. Protect yourself by wearing long sleeves, pants, a wide-brimmed hat, and sunglasses year round, whenever you are outdoors.  Once a month, do a whole body skin exam, using a mirror to look at the skin on your back. Notify your caregiver of new moles, moles that have irregular borders, moles that are larger than a pencil eraser, or moles that have changed in shape or color.  Stay current with required immunizations.  Influenza. You need a dose every fall (or winter). The composition of the flu vaccine  changes each year, so being vaccinated once is not enough.  Pneumococcal polysaccharide. You need 1 to 2 doses if you smoke cigarettes or if you have certain chronic medical conditions. You need 1 dose at age 65 (or older) if you have never been vaccinated.  Tetanus, diphtheria, pertussis (Tdap, Td). Get 1 dose of Tdap vaccine if you are younger than age 65, are over 65 and have contact with an infant, are a healthcare worker, are pregnant, or simply want to be protected from whooping cough. After that, you need a Td   booster dose every 10 years. Consult your caregiver if you have not had at least 3 tetanus and diphtheria-containing shots sometime in your life or have a deep or dirty wound.  HPV. You need this vaccine if you are a woman age 26 or younger. The vaccine is given in 3 doses over 6 months.  Measles, mumps, rubella (MMR). You need at least 1 dose of MMR if you were born in 1957 or later. You may also need a second dose.  Meningococcal. If you are age 19 to 21 and a first-year college student living in a residence hall, or have one of several medical conditions, you need to get vaccinated against meningococcal disease. You may also need additional booster doses.  Zoster (shingles). If you are age 60 or older, you should get this vaccine.  Varicella (chickenpox). If you have never had chickenpox or you were vaccinated but received only 1 dose, talk to your caregiver to find out if you need this vaccine.  Hepatitis A. You need this vaccine if you have a specific risk factor for hepatitis A virus infection or you simply wish to be protected from this disease. The vaccine is usually given as 2 doses, 6 to 18 months apart.  Hepatitis B. You need this vaccine if you have a specific risk factor for hepatitis B virus infection or you simply wish to be protected from this disease. The vaccine is given in 3 doses, usually over 6 months. Preventive Services / Frequency Ages 19 to 39  Blood  pressure check.** / Every 1 to 2 years.  Lipid and cholesterol check.** / Every 5 years beginning at age 20.  Clinical breast exam.** / Every 3 years for women in their 20s and 30s.  Pap test.** / Every 2 years from ages 21 through 29. Every 3 years starting at age 30 through age 65 or 70 with a history of 3 consecutive normal Pap tests.  HPV screening.** / Every 3 years from ages 30 through ages 65 to 70 with a history of 3 consecutive normal Pap tests.  Hepatitis C blood test.** / For any individual with known risks for hepatitis C.  Skin self-exam. / Monthly.  Influenza immunization.** / Every year.  Pneumococcal polysaccharide immunization.** / 1 to 2 doses if you smoke cigarettes or if you have certain chronic medical conditions.  Tetanus, diphtheria, pertussis (Tdap, Td) immunization. / A one-time dose of Tdap vaccine. After that, you need a Td booster dose every 10 years.  HPV immunization. / 3 doses over 6 months, if you are 26 and younger.  Measles, mumps, rubella (MMR) immunization. / You need at least 1 dose of MMR if you were born in 1957 or later. You may also need a second dose.  Meningococcal immunization. / 1 dose if you are age 19 to 21 and a first-year college student living in a residence hall, or have one of several medical conditions, you need to get vaccinated against meningococcal disease. You may also need additional booster doses.  Varicella immunization.** / Consult your caregiver.  Hepatitis A immunization.** / Consult your caregiver. 2 doses, 6 to 18 months apart.  Hepatitis B immunization.** / Consult your caregiver. 3 doses usually over 6 months. Ages 40 to 64  Blood pressure check.** / Every 1 to 2 years.  Lipid and cholesterol check.** / Every 5 years beginning at age 20.  Clinical breast exam.** / Every year after age 40.  Mammogram.** / Every year beginning at age 40   and continuing for as long as you are in good health. Consult with your  caregiver.  Pap test.** / Every 3 years starting at age 30 through age 65 or 70 with a history of 3 consecutive normal Pap tests.  HPV screening.** / Every 3 years from ages 30 through ages 65 to 70 with a history of 3 consecutive normal Pap tests.  Fecal occult blood test (FOBT) of stool. / Every year beginning at age 50 and continuing until age 75. You may not need to do this test if you get a colonoscopy every 10 years.  Flexible sigmoidoscopy or colonoscopy.** / Every 5 years for a flexible sigmoidoscopy or every 10 years for a colonoscopy beginning at age 50 and continuing until age 75.  Hepatitis C blood test.** / For all people born from 1945 through 1965 and any individual with known risks for hepatitis C.  Skin self-exam. / Monthly.  Influenza immunization.** / Every year.  Pneumococcal polysaccharide immunization.** / 1 to 2 doses if you smoke cigarettes or if you have certain chronic medical conditions.  Tetanus, diphtheria, pertussis (Tdap, Td) immunization.** / A one-time dose of Tdap vaccine. After that, you need a Td booster dose every 10 years.  Measles, mumps, rubella (MMR) immunization. / You need at least 1 dose of MMR if you were born in 1957 or later. You may also need a second dose.  Varicella immunization.** / Consult your caregiver.  Meningococcal immunization.** / Consult your caregiver.  Hepatitis A immunization.** / Consult your caregiver. 2 doses, 6 to 18 months apart.  Hepatitis B immunization.** / Consult your caregiver. 3 doses, usually over 6 months. Ages 65 and over  Blood pressure check.** / Every 1 to 2 years.  Lipid and cholesterol check.** / Every 5 years beginning at age 20.  Clinical breast exam.** / Every year after age 40.  Mammogram.** / Every year beginning at age 40 and continuing for as long as you are in good health. Consult with your caregiver.  Pap test.** / Every 3 years starting at age 30 through age 65 or 70 with a 3  consecutive normal Pap tests. Testing can be stopped between 65 and 70 with 3 consecutive normal Pap tests and no abnormal Pap or HPV tests in the past 10 years.  HPV screening.** / Every 3 years from ages 30 through ages 65 or 70 with a history of 3 consecutive normal Pap tests. Testing can be stopped between 65 and 70 with 3 consecutive normal Pap tests and no abnormal Pap or HPV tests in the past 10 years.  Fecal occult blood test (FOBT) of stool. / Every year beginning at age 50 and continuing until age 75. You may not need to do this test if you get a colonoscopy every 10 years.  Flexible sigmoidoscopy or colonoscopy.** / Every 5 years for a flexible sigmoidoscopy or every 10 years for a colonoscopy beginning at age 50 and continuing until age 75.  Hepatitis C blood test.** / For all people born from 1945 through 1965 and any individual with known risks for hepatitis C.  Osteoporosis screening.** / A one-time screening for women ages 65 and over and women at risk for fractures or osteoporosis.  Skin self-exam. / Monthly.  Influenza immunization.** / Every year.  Pneumococcal polysaccharide immunization.** / 1 dose at age 65 (or older) if you have never been vaccinated.  Tetanus, diphtheria, pertussis (Tdap, Td) immunization. / A one-time dose of Tdap vaccine if you are over   65 and have contact with an infant, are a healthcare worker, or simply want to be protected from whooping cough. After that, you need a Td booster dose every 10 years.  Varicella immunization.** / Consult your caregiver.  Meningococcal immunization.** / Consult your caregiver.  Hepatitis A immunization.** / Consult your caregiver. 2 doses, 6 to 18 months apart.  Hepatitis B immunization.** / Check with your caregiver. 3 doses, usually over 6 months. ** Family history and personal history of risk and conditions may change your caregiver's recommendations. Document Released: 07/19/2001 Document Revised: 08/15/2011  Document Reviewed: 10/18/2010 [employer] Patient Information 2014 [employer].  

## 2012-11-22 NOTE — Telephone Encounter (Signed)
Left message for pt, advising to call and schedule an appointment with Dr. Darrick Huntsman, last OV 4/13.

## 2012-11-30 ENCOUNTER — Ambulatory Visit: Payer: Self-pay | Admitting: General Practice

## 2012-12-04 ENCOUNTER — Encounter: Payer: Self-pay | Admitting: General Practice

## 2012-12-21 ENCOUNTER — Telehealth: Payer: Self-pay | Admitting: Internal Medicine

## 2012-12-21 NOTE — Telephone Encounter (Signed)
Called pt to let her know that we have disk for her to pick up Pt stated she was going to pick up disk

## 2012-12-25 ENCOUNTER — Telehealth: Payer: Self-pay | Admitting: Nurse Practitioner

## 2012-12-25 MED ORDER — METOPROLOL SUCCINATE ER 50 MG PO TB24: 50.0000 mg | ORAL_TABLET | Freq: Every day | ORAL | Status: DC

## 2012-12-25 NOTE — Telephone Encounter (Signed)
Would like Metoprolol 50mg  called usually PCP does this for her

## 2013-04-11 ENCOUNTER — Other Ambulatory Visit: Payer: Self-pay

## 2013-04-25 ENCOUNTER — Ambulatory Visit (INDEPENDENT_AMBULATORY_CARE_PROVIDER_SITE_OTHER): Payer: 59 | Admitting: *Deleted

## 2013-04-25 DIAGNOSIS — G43909 Migraine, unspecified, not intractable, without status migrainosus: Secondary | ICD-10-CM

## 2013-04-25 MED ORDER — KETOROLAC TROMETHAMINE 60 MG/2ML IM SOLN
60.0000 mg | Freq: Once | INTRAMUSCULAR | Status: AC
  Administered 2013-04-25: 60 mg via INTRAMUSCULAR

## 2013-04-25 NOTE — Progress Notes (Signed)
Patient has had a headache for one week now that will not go away with her medications she has at home.  We will give her a shot of torodol 60mg  today to hopefully alleviate her symptoms.  She will follow up with [place] if this does not give her significant relief.  Patient received her flu shot through [facility] employee health.

## 2013-04-30 ENCOUNTER — Telehealth: Payer: Self-pay | Admitting: *Deleted

## 2013-04-30 NOTE — Telephone Encounter (Signed)
Patient called with headache still hurting very bad.  She spoke with Bonita Quin who called in a prednisone taper for her.  She will keep [place] informed of her status.

## 2013-05-01 ENCOUNTER — Ambulatory Visit: Payer: Self-pay | Admitting: Internal Medicine

## 2013-05-01 LAB — HM MAMMOGRAPHY: HM Mammogram: NORMAL

## 2013-05-06 ENCOUNTER — Encounter: Payer: Self-pay | Admitting: Internal Medicine

## 2013-05-06 ENCOUNTER — Ambulatory Visit (INDEPENDENT_AMBULATORY_CARE_PROVIDER_SITE_OTHER): Payer: 59 | Admitting: Internal Medicine

## 2013-05-06 VITALS — BP 134/84 | HR 76 | Temp 98.2°F | Resp 12 | Ht 66.0 in | Wt 205.5 lb

## 2013-05-06 DIAGNOSIS — G4459 Other complicated headache syndrome: Secondary | ICD-10-CM

## 2013-05-06 DIAGNOSIS — R5383 Other fatigue: Secondary | ICD-10-CM

## 2013-05-06 DIAGNOSIS — E119 Type 2 diabetes mellitus without complications: Secondary | ICD-10-CM

## 2013-05-06 DIAGNOSIS — R5381 Other malaise: Secondary | ICD-10-CM

## 2013-05-06 DIAGNOSIS — E669 Obesity, unspecified: Secondary | ICD-10-CM

## 2013-05-06 DIAGNOSIS — E559 Vitamin D deficiency, unspecified: Secondary | ICD-10-CM

## 2013-05-06 LAB — HM DIABETES FOOT EXAM: HM Diabetic Foot Exam: NORMAL

## 2013-05-06 NOTE — Assessment & Plan Note (Signed)
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  Well-controlled on current medications.  hemoglobin A1c has been consistently less than 7.0 . She is due for her annual eye exams and her foot exam is norma. l we'll repeat his urine microalbumin to creatinine ratio today and add ACE I if indicated.

## 2013-05-06 NOTE — Progress Notes (Signed)
Pre-visit discussion using our clinic review tool. No additional management support is needed unless otherwise documented below in the visit note.  

## 2013-05-06 NOTE — Progress Notes (Signed)
Patient ID: [name], female   DOB: March 17, 1971, 42 y.o.   MRN: [id number]  Patient Active Problem List   Diagnosis Date Noted  . Migraine without aura 11/20/2012  . S/P Total Abdominal Hysterectomy and Left Salpingo-oophorectomy 10/20/2011  . Headache disorder   . Headache syndrome, complicated 07/20/2011  . Diabetes mellitus type 2, controlled 07/20/2011  . Obesity (BMI 30-39.9) 07/20/2011  . Hypertension goal BP (blood pressure) < 130/80 07/20/2011    Subjective:  CC:   Chief Complaint  Patient presents with  . Follow-up    HPI:   FARYN SIEG a 42 y.o. female who presents Follow up on DM type 2, diet  controlled. Last a1c was done in  Feb 2013.and was 6.8.  She has  been lost to follow up over one year,   In grad school now . [name]  Seeing Jannifer Rodney for migraines, receiving Botox injections every 3 months. Not eating well due to work schedule,  Not exercising,  Worried about 55 yr old son who needs BM transplant for aplastic anemia.    Past Medical History  Diagnosis Date  . Diabetes mellitus 2011    diet controlled,  gestational  . Headache disorder   . Allergy   . Anemia     Past Surgical History  Procedure Laterality Date  . Hernia repair  2003    left inguinal   . Breast surgery      right breast x 2 , benign  . Tubal ligation    . Abdominal hysterectomy  2006    heavy menses, endometriosis, l oophrectomy  . Left oophorectomy         The following portions of the patient's history were reviewed and updated as appropriate: Allergies, current medications, and problem list.    Review of Systems:   12 Pt  review of systems was negative except those addressed in the HPI,     History   Social History  . Marital Status: Legally Separated    Spouse Name: N/A    Number of Children: N/A  . Years of Education: N/A   Occupational History  . Not on file.   Social History Main Topics  . Smoking status: Never Smoker   . Smokeless  tobacco: Never Used  . Alcohol Use: No  . Drug Use: No  . Sexual Activity: Not Currently    Partners: Male    Birth Control/ Protection: Surgical   Other Topics Concern  . Not on file   Social History Narrative  . No narrative on file    Objective:  Filed Vitals:   05/06/13 1657  BP: 134/84  Pulse: 76  Temp: 98.2 F (36.8 C)  Resp: 12     General appearance: alert, cooperative and appears stated age Ears: normal TM's and external ear canals both ears Throat: lips, mucosa, and tongue normal; teeth and gums normal Neck: no adenopathy, no carotid bruit, supple, symmetrical, trachea midline and thyroid not enlarged, symmetric, no tenderness/mass/nodules Back: symmetric, no curvature. ROM normal. No CVA tenderness. Lungs: clear to auscultation bilaterally Heart: regular rate and rhythm, S1, S2 normal, no murmur, click, rub or gallop Abdomen: soft, non-tender; bowel sounds normal; no masses,  no organomegaly Pulses: 2+ and symmetric Skin: Skin color, texture, turgor normal. No rashes or lesions Lymph nodes: Cervical, supraclavicular, and axillary nodes normal.  Assessment and Plan:  Diabetes mellitus type 2, controlled Historically well controlled bvut lost to follow up for over a year.  Well-controlled on  current medications.  hemoglobin A1c has been consistently less than 7.0 . She is due for her annual eye exams and her foot exam is norma. l we'll repeat his urine microalbumin to creatinine ratio today and add ACE I if indicated.   Headache syndrome, complicated w managed with botox injections due to intolerance to topomax .     Obesity (BMI 30-39.9) I have addressed  BMI and recommended wt loss of 10% of body weight over the next 6 months using a low glycemic index diet and regular exercise a minimum of 5 days per week.   A total of 40 minutes was spent with patient more than half of which was spent in counseling, reviewing records from other prviders and coordination  of care. Updated Medication List Outpatient Encounter Prescriptions as of 05/06/2013  Medication Sig  . albuterol (PROVENTIL HFA;VENTOLIN HFA) 108 (90 BASE) MCG/ACT inhaler One puff as needed  . ALPRAZolam (XANAX) 0.5 MG tablet Take 1 tablet (0.5 mg total) by mouth daily as needed for sleep. Take one by mouth as needed  . botulinum toxin Type A (BOTOX) 100 UNITS SOLR Bring to office to be administered.  . isometheptene-acetaminophen-dichloralphenazone (MIDRIN) 65-325-100 MG capsule Take 1 capsule by mouth 4 (four) times daily as needed.  . metoprolol succinate (TOPROL-XL) 50 MG 24 hr tablet Take 1 tablet (50 mg total) by mouth daily. Take with or immediately following a meal.  . Omega-3 Fatty Acids (FISH OIL) 1000 MG CAPS Take 2 by mouth twice a day.  . ondansetron (ZOFRAN-ODT) 4 MG disintegrating tablet Take 4 mg by mouth every 8 (eight) hours as needed.  . orphenadrine (NORFLEX) 100 MG tablet Take 100 mg by mouth 2 (two) times daily as needed.  [name] oxyCODONE-acetaminophen (PERCOCET/ROXICET) 5-325 MG per tablet Take 1 tablet by mouth every 6 (six) hours as needed for pain. Take one by mouth as needed  . zonisamide (ZONEGRAN) 100 MG capsule Take 1 capsule (100 mg total) by mouth 3 (three) times daily.     Orders Placed This Encounter  Procedures  . HM MAMMOGRAPHY  . Lipid panel  . Hemoglobin A1c  . Microalbumin / creatinine urine ratio  . Comprehensive metabolic panel  . TSH  . Vit D  25 hydroxy (rtn osteoporosis monitoring)  . HM DIABETES EYE EXAM  . HM DIABETES FOOT EXAM    No Follow-up on file.

## 2013-05-06 NOTE — Assessment & Plan Note (Signed)
w managed with botox injections due to intolerance to topomax .

## 2013-05-06 NOTE — Assessment & Plan Note (Signed)
I have addressed  BMI and recommended wt loss of 10% of body weight over the next 6 months using a low glycemic index diet and regular exercise a minimum of 5 days per week.   

## 2013-05-07 ENCOUNTER — Encounter: Payer: Self-pay | Admitting: Internal Medicine

## 2013-05-07 DIAGNOSIS — E559 Vitamin D deficiency, unspecified: Secondary | ICD-10-CM | POA: Insufficient documentation

## 2013-05-07 LAB — COMPREHENSIVE METABOLIC PANEL
ALT: 13 U/L (ref 0–35)
AST: 16 U/L (ref 0–37)
Albumin: 4.6 g/dL (ref 3.5–5.2)
Alkaline Phosphatase: 82 U/L (ref 39–117)
BUN: 11 mg/dL (ref 6–23)
CO2: 19 mEq/L (ref 19–32)
Calcium: 9.4 mg/dL (ref 8.4–10.5)
Chloride: 107 mEq/L (ref 96–112)
Creatinine, Ser: 1 mg/dL (ref 0.4–1.2)
GFR: 81.28 mL/min (ref >60.00)
Glucose, Bld: 95 mg/dL (ref 70–99)
Potassium: 3.5 mEq/L (ref 3.5–5.1)
Sodium: 138 mEq/L (ref 135–145)
Total Bilirubin: 0.5 mg/dL (ref 0.3–1.2)
Total Protein: 7.9 g/dL (ref 6.0–8.3)

## 2013-05-07 LAB — HEMOGLOBIN A1C: Hgb A1c MFr Bld: 6.2 % (ref 4.6–6.5)

## 2013-05-07 LAB — MICROALBUMIN / CREATININE URINE RATIO
Creatinine,U: 217.1 mg/dL
Microalb Creat Ratio: 1.8 mg/g (ref 0.0–30.0)
Microalb, Ur: 3.9 mg/dL — ABNORMAL HIGH (ref 0.0–1.9)

## 2013-05-07 LAB — LIPID PANEL
Cholesterol: 185 mg/dL (ref 0–200)
HDL: 45 mg/dL (ref >39.00)
LDL Cholesterol: 120 mg/dL — ABNORMAL HIGH (ref 0–99)
Total CHOL/HDL Ratio: 4
Triglycerides: 100 mg/dL (ref 0.0–149.0)
VLDL: 20 mg/dL (ref 0.0–40.0)

## 2013-05-07 LAB — VITAMIN D 25 HYDROXY (VIT D DEFICIENCY, FRACTURES): Vit D, 25-Hydroxy: 18 ng/mL — ABNORMAL LOW (ref 30–89)

## 2013-05-07 LAB — TSH: TSH: 0.52 u[IU]/mL (ref 0.35–5.50)

## 2013-05-07 MED ORDER — ERGOCALCIFEROL 1.25 MG (50000 UT) PO CAPS: 50000.0000 [IU] | ORAL_CAPSULE | ORAL | Status: DC

## 2013-05-07 NOTE — Addendum Note (Signed)
Addended by: Sherlene Shams on: 05/07/2013 07:01 AM   Modules accepted: Orders

## 2013-05-08 ENCOUNTER — Encounter: Payer: Self-pay | Admitting: Internal Medicine

## 2013-05-10 ENCOUNTER — Ambulatory Visit: Payer: 59 | Admitting: Internal Medicine

## 2013-05-11 LAB — HM DIABETES EYE EXAM

## 2013-05-14 ENCOUNTER — Encounter: Payer: Self-pay | Admitting: Nurse Practitioner

## 2013-05-14 ENCOUNTER — Ambulatory Visit (INDEPENDENT_AMBULATORY_CARE_PROVIDER_SITE_OTHER): Payer: 59 | Admitting: Nurse Practitioner

## 2013-05-14 VITALS — BP 159/104 | HR 60 | Ht 66.0 in | Wt 208.8 lb

## 2013-05-14 DIAGNOSIS — G43909 Migraine, unspecified, not intractable, without status migrainosus: Secondary | ICD-10-CM

## 2013-05-14 MED ORDER — VENLAFAXINE HCL ER 75 MG PO CP24: 75.0000 mg | ORAL_CAPSULE | Freq: Every day | ORAL | Status: DC

## 2013-05-15 ENCOUNTER — Encounter: Payer: Self-pay | Admitting: Nurse Practitioner

## 2013-05-15 NOTE — Progress Notes (Signed)
History:  [name] is a 42 y.o. [phone] who presents to [facility] clinic today for acute migraine. She has been having a daily migraine for the last 3 months. 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 She is  2 months past due for Botox, she had it twice and did not feel it was helpful. She is requesting a trigger point injection today.  She remains on Zonegran 300 mg for prevention. States her BP at work is 130/74 and that it is only elevated when she is in pain with migraine.   The following portions of the patient's history were reviewed and updated as appropriate: allergies, current medications, past family history, past medical history, past social history, past surgical history and problem list.  Review of Systems:  Pertinent items are noted in HPI.  Objective:  Physical Exam BP 159/104  Pulse 60  Ht 5\' 6"  (1.676 m)  Wt 208 lb 12.8 oz (94.711 kg)  BMI 33.72 kg/m2 GENERAL: Well-developed, well-nourished female in no acute distress.  HEENT: Normocephalic, atraumatic.  NECK: Supple. Normal thyroid.  LUNGS: Normal rate. Clear to auscultation bilaterally.  HEART: Regular rate and rhythm with no adventitious sounds.  EXTREMITIES: No cyanosis, clubbing, or edema, 2+ distal pulses.   Labs and Imaging No results found.  Assessment & Plan:  Assessment: Acute and chronic migraine  Plans: We had a lengthy discussion concerning her headache state. She is reluctant to take medications and has little time in her life to do nonmedication therapies. We agreed to do trigger point injections today, not repeat Botox, and start her on second prevention Effexor 75 mg. She will take and record her BP daily. She will RTC 6 weeks or sooner as needed  Delbert Phenix, NP 05/15/2013 4:16 PM

## 2013-05-15 NOTE — Patient Instructions (Signed)
Migraine Headache A migraine headache is an intense, throbbing pain on one or both sides of your head. A migraine can last for 30 minutes to several hours. CAUSES  The exact cause of a migraine headache is not always known. However, a migraine may be caused when nerves in the brain become irritated and release chemicals that cause inflammation. This causes pain. SYMPTOMS  Pain on one or both sides of your head.  Pulsating or throbbing pain.  Severe pain that prevents daily activities.  Pain that is aggravated by any physical activity.  Nausea, vomiting, or both.  Dizziness.  Pain with exposure to bright lights, loud noises, or activity.  General sensitivity to bright lights, loud noises, or smells. Before you get a migraine, you may get warning signs that a migraine is coming (aura). An aura may include:  Seeing flashing lights.  Seeing bright spots, halos, or zig-zag lines.  Having tunnel vision or blurred vision.  Having feelings of numbness or tingling.  Having trouble talking.  Having muscle weakness. MIGRAINE TRIGGERS  Alcohol.  Smoking.  Stress.  Menstruation.  Aged cheeses.  Foods or drinks that contain nitrates, glutamate, aspartame, or tyramine.  Lack of sleep.  Chocolate.  Caffeine.  Hunger.  Physical exertion.  Fatigue.  Medicines used to treat chest pain (nitroglycerine), birth control pills, estrogen, and some blood pressure medicines. DIAGNOSIS  A migraine headache is often diagnosed based on:  Symptoms.  Physical examination.  A CT scan or MRI of your head. TREATMENT Medicines may be given for pain and nausea. Medicines can also be given to help prevent recurrent migraines.  HOME CARE INSTRUCTIONS  Only take over-the-counter or prescription medicines for pain or discomfort as directed by your caregiver. The use of long-term narcotics is not recommended.  Lie down in a dark, quiet room when you have a migraine.  Keep a journal  to find out what may trigger your migraine headaches. For example, write down:  What you eat and drink.  How much sleep you get.  Any change to your diet or medicines.  Limit alcohol consumption.  Quit smoking if you smoke.  Get 7 to 9 hours of sleep, or as recommended by your caregiver.  Limit stress.  Keep lights dim if bright lights bother you and make your migraines worse. SEEK IMMEDIATE MEDICAL CARE IF:   Your migraine becomes severe.  You have a fever.  You have a stiff neck.  You have vision loss.  You have muscular weakness or loss of muscle control.  You start losing your balance or have trouble walking.  You feel faint or pass out.  You have severe symptoms that are different from your first symptoms. MAKE SURE YOU:   Understand these instructions.  Will watch your condition.  Will get help right away if you are not doing well or get worse. Document Released: 05/23/2005 Document Revised: 08/15/2011 Document Reviewed: 05/13/2011 [employer] Patient Information 2014 [employer].  

## 2013-05-17 ENCOUNTER — Ambulatory Visit: Payer: 59 | Admitting: Internal Medicine

## 2013-05-23 ENCOUNTER — Encounter: Payer: Self-pay | Admitting: Internal Medicine

## 2013-05-24 LAB — HM DIABETES EYE EXAM

## 2013-06-14 ENCOUNTER — Ambulatory Visit (INDEPENDENT_AMBULATORY_CARE_PROVIDER_SITE_OTHER): Payer: 59 | Admitting: Internal Medicine

## 2013-06-14 VITALS — BP 120/84 | HR 67 | Temp 98.2°F | Wt 206.0 lb

## 2013-06-14 DIAGNOSIS — R6889 Other general symptoms and signs: Secondary | ICD-10-CM

## 2013-06-14 LAB — CBC WITH DIFFERENTIAL/PLATELET
Basophils Absolute: 0 10*3/uL (ref 0.0–0.1)
Basophils Relative: 0 % (ref 0–1)
Eosinophils Absolute: 0.1 10*3/uL (ref 0.0–0.7)
Eosinophils Relative: 2 % (ref 0–5)
HCT: 35.5 % — ABNORMAL LOW (ref 36.0–46.0)
Hemoglobin: 12.1 g/dL (ref 12.0–15.0)
Lymphocytes Relative: 28 % (ref 12–46)
Lymphs Abs: 2.5 10*3/uL (ref 0.7–4.0)
MCH: 29.7 pg (ref 26.0–34.0)
MCHC: 34.1 g/dL (ref 30.0–36.0)
MCV: 87.2 fL (ref 78.0–100.0)
Monocytes Absolute: 0.7 10*3/uL (ref 0.1–1.0)
Monocytes Relative: 8 % (ref 3–12)
Neutro Abs: 5.6 10*3/uL (ref 1.7–7.7)
Neutrophils Relative %: 62 % (ref 43–77)
Platelets: 195 10*3/uL (ref 150–400)
RBC: 4.07 MIL/uL (ref 3.87–5.11)
RDW: 14.2 % (ref 11.5–15.5)
WBC: 8.9 10*3/uL (ref 4.0–10.5)

## 2013-06-14 LAB — COMPREHENSIVE METABOLIC PANEL
ALT: 14 U/L (ref 0–35)
AST: 13 U/L (ref 0–37)
Albumin: 4 g/dL (ref 3.5–5.2)
Alkaline Phosphatase: 72 U/L (ref 39–117)
BUN: 8 mg/dL (ref 6–23)
CO2: 23 mEq/L (ref 19–32)
Calcium: 8.9 mg/dL (ref 8.4–10.5)
Chloride: 106 mEq/L (ref 96–112)
Creat: 0.83 mg/dL (ref 0.50–1.10)
Glucose, Bld: 92 mg/dL (ref 70–99)
Potassium: 4.2 mEq/L (ref 3.5–5.3)
Sodium: 137 mEq/L (ref 135–145)
Total Bilirubin: 0.4 mg/dL (ref 0.3–1.2)
Total Protein: 6.8 g/dL (ref 6.0–8.3)

## 2013-06-14 LAB — LIPASE: Lipase: <10 U/L (ref 0–75)

## 2013-06-14 LAB — POCT INFLUENZA A/B
Influenza A, POC: NEGATIVE
Influenza B, POC: NEGATIVE

## 2013-06-14 NOTE — Progress Notes (Signed)
Subjective:    Patient ID: [name], female    DOB: January 10, 1971, 43 y.o.   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 Tuesday had sudden onset fever 101-102F, general malaise, myalgia, weakness. Mild sore throat, congestion, dry cough, mild dyspnea.Symptoms persistent through yesterday. Continues to feel weak, nauseous. Very poor po intake. Able to drink ginger ale but no solids. Taking Ondansetron with minimal improvement in nausea. No recurrent fever. No abdominal pain.     Outpatient Encounter Prescriptions as of 06/14/2013  Medication Sig  . albuterol (PROVENTIL HFA;VENTOLIN HFA) 108 (90 BASE) MCG/ACT inhaler One puff as needed  . ALPRAZolam (XANAX) 0.5 MG tablet Take 1 tablet (0.5 mg total) by mouth daily as needed for sleep. Take one by mouth as needed  . botulinum toxin Type A (BOTOX) 100 UNITS SOLR Bring to office to be administered.  . ergocalciferol (DRISDOL) 50000 UNITS capsule Take 1 capsule (50,000 Units total) by mouth once a week.  . isometheptene-acetaminophen-dichloralphenazone (MIDRIN) 65-325-100 MG capsule Take 1 capsule by mouth 4 (four) times daily as needed.  . meclizine (ANTIVERT) 25 MG tablet Take 25 mg by mouth 3 (three) times daily as needed for dizziness.  . metoprolol succinate (TOPROL-XL) 50 MG 24 hr tablet Take 1 tablet (50 mg total) by mouth daily. Take with or immediately following a meal.  . Omega-3 Fatty Acids (FISH OIL) 1000 MG CAPS Take 2 by mouth twice a day.  . ondansetron (ZOFRAN-ODT) 4 MG disintegrating tablet Take 4 mg by mouth every 8 (eight) hours as needed.  . orphenadrine (NORFLEX) 100 MG tablet Take 100 mg by mouth 2 (two) times daily as needed.  [name] oxyCODONE-acetaminophen (PERCOCET/ROXICET) 5-325 MG per tablet Take 1 tablet by mouth every 6 (six) hours as needed for pain. Take one by mouth as needed  . venlafaxine XR (EFFEXOR-XR) 75 MG 24 hr capsule Take 1 capsule (75 mg total) by mouth  daily with breakfast.  . zonisamide (ZONEGRAN) 100 MG capsule Take 1 capsule (100 mg total) by mouth 3 (three) times daily.     Review of Systems  Constitutional: Positive for fever, chills and fatigue. Negative for appetite change and unexpected weight change.  HENT: Positive for congestion and rhinorrhea. Negative for ear pain, sinus pressure, sore throat, trouble swallowing and voice change.   Eyes: Negative for visual disturbance.  Respiratory: Positive for cough. Negative for shortness of breath, wheezing and stridor.   Cardiovascular: Negative for chest pain, palpitations and leg swelling.  Gastrointestinal: Positive for nausea, vomiting and abdominal pain. Negative for diarrhea, constipation, blood in stool, abdominal distention and anal bleeding.  Genitourinary: Negative for dysuria and flank pain.  Musculoskeletal: Positive for myalgias. Negative for arthralgias, gait problem and neck pain.  Skin: Negative for color change and rash.  Neurological: Positive for weakness. Negative for dizziness and headaches.  Hematological: Negative for adenopathy. Does not bruise/bleed easily.  Psychiatric/Behavioral: Negative for suicidal ideas, sleep disturbance and dysphoric mood. The patient is not nervous/anxious.        Objective:   Physical Exam  Constitutional: She is oriented to person, place, and time. She appears well-developed and well-nourished. She has a sickly appearance. No distress.  HENT:  Head: Normocephalic and atraumatic.  Right Ear: External ear normal.  Left Ear: External ear normal.  Nose: Nose normal.  Mouth/Throat: Oropharynx is clear and moist. No oropharyngeal exudate.  Eyes: Conjunctivae are normal. Pupils are equal, round, and reactive to  light. Right eye exhibits no discharge. Left eye exhibits no discharge. No scleral icterus.  Neck: Normal range of motion. Neck supple. No tracheal deviation present. No thyromegaly present.  Cardiovascular: Normal rate, regular  rhythm, normal heart sounds and intact distal pulses.  Exam reveals no gallop and no friction rub.   No murmur heard. Pulmonary/Chest: Effort normal and breath sounds normal. No accessory muscle usage. Not tachypneic. No respiratory distress. She has no decreased breath sounds. She has no wheezes. She has no rhonchi. She has no rales. She exhibits no tenderness.  Abdominal: Soft. Bowel sounds are normal. She exhibits no distension. There is no tenderness.  Musculoskeletal: Normal range of motion. She exhibits no edema and no tenderness.  Lymphadenopathy:    She has no cervical adenopathy.  Neurological: She is alert and oriented to person, place, and time. No cranial nerve deficit. She exhibits normal muscle tone. Coordination normal.  Skin: Skin is warm and dry. No rash noted. She is not diaphoretic. No erythema. No pallor.  Psychiatric: She has a normal mood and affect. Her behavior is normal. Judgment and thought content normal.          Assessment & Plan:

## 2013-06-15 ENCOUNTER — Encounter: Payer: Self-pay | Admitting: Internal Medicine

## 2013-06-15 DIAGNOSIS — R6889 Other general symptoms and signs: Secondary | ICD-10-CM | POA: Insufficient documentation

## 2013-06-15 NOTE — Assessment & Plan Note (Signed)
Symptoms most consistent with influenza, despite rapid test negative today. Encouraged rest, continued fluid intake. Will continue Ondansetron for nausea. Offered phenergan for nausea, however pt thinks this will make her too drowsy. She will call or RTC if she is not improving, if she cannot keep fluids down, or if recurrent fever or other concerns. She will stay out of work this weekend and RTC for re-eval next Monday prior to going back to work.

## 2013-06-17 ENCOUNTER — Encounter: Payer: Self-pay | Admitting: Adult Health

## 2013-06-17 ENCOUNTER — Ambulatory Visit (INDEPENDENT_AMBULATORY_CARE_PROVIDER_SITE_OTHER): Payer: 59 | Admitting: Adult Health

## 2013-06-17 VITALS — BP 110/72 | HR 89 | Temp 98.8°F | Resp 12 | Wt 206.0 lb

## 2013-06-17 DIAGNOSIS — H109 Unspecified conjunctivitis: Secondary | ICD-10-CM | POA: Insufficient documentation

## 2013-06-17 DIAGNOSIS — R6889 Other general symptoms and signs: Secondary | ICD-10-CM

## 2013-06-17 MED ORDER — NEOMYCIN-POLYMYXIN-DEXAMETH 0.1 % OP SUSP: 1.0000 [drp] | Freq: Three times a day (TID) | OPHTHALMIC | Status: DC

## 2013-06-17 MED ORDER — GUAIFENESIN-CODEINE 100-10 MG/5ML PO SOLN: 5.0000 mL | Freq: Three times a day (TID) | ORAL | Status: DC | PRN

## 2013-06-17 NOTE — Progress Notes (Signed)
Subjective:    Patient ID: [name], female    DOB: [birth year], 43 y.o.   MRN: [id number]  HPI Is a 43 year old female who presents to clinic for followup flu like symptoms. 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 She reports that she was in bed for 3 days straight. She was taking over-the-counter medications to manage her symptoms. She feels, overall, significantly improved. Still has somewhat of a cough. Now also with redness and irritation of both eyes. She denies visual disturbances or pain in her eye. She denies headache.  Current Outpatient Prescriptions on File Prior to Visit  Medication Sig Dispense Refill  . albuterol (PROVENTIL HFA;VENTOLIN HFA) 108 (90 BASE) MCG/ACT inhaler One puff as needed      . ALPRAZolam (XANAX) 0.5 MG tablet Take 1 tablet (0.5 mg total) by mouth daily as needed for sleep. Take one by mouth as needed  30 tablet  1  . botulinum toxin Type A (BOTOX) 100 UNITS SOLR Bring to office to be administered.  1 each  3  . ergocalciferol (DRISDOL) 50000 UNITS capsule Take 1 capsule (50,000 Units total) by mouth once a week.  12 capsule  0  . isometheptene-acetaminophen-dichloralphenazone (MIDRIN) 65-325-100 MG capsule Take 1 capsule by mouth 4 (four) times daily as needed.  60 capsule  3  . meclizine (ANTIVERT) 25 MG tablet Take 25 mg by mouth 3 (three) times daily as needed for dizziness.      . metoprolol succinate (TOPROL-XL) 50 MG 24 hr tablet Take 1 tablet (50 mg total) by mouth daily. Take with or immediately following a meal.  30 tablet  11  . Omega-3 Fatty Acids (FISH OIL) 1000 MG CAPS Take 2 by mouth twice a day.      . ondansetron (ZOFRAN-ODT) 4 MG disintegrating tablet Take 4 mg by mouth every 8 (eight) hours as needed.      . orphenadrine (NORFLEX) 100 MG tablet Take 100 mg by mouth 2 (two) times daily as needed.      [name] oxyCODONE-acetaminophen (PERCOCET/ROXICET) 5-325 MG per tablet Take 1  tablet by mouth every 6 (six) hours as needed for pain. Take one by mouth as needed  30 tablet  0  . venlafaxine XR (EFFEXOR-XR) 75 MG 24 hr capsule Take 1 capsule (75 mg total) by mouth daily with breakfast.  30 capsule  11  . zonisamide (ZONEGRAN) 100 MG capsule Take 1 capsule (100 mg total) by mouth 3 (three) times daily.  90 capsule  6   No current facility-administered medications on file prior to visit.    Review of Systems  Constitutional: Negative for fever and chills.  HENT: Positive for voice change.   Eyes: Positive for redness and itching. Negative for photophobia, pain and visual disturbance.  Respiratory: Positive for cough. Negative for chest tightness, shortness of breath and wheezing.   Cardiovascular: Negative.   Psychiatric/Behavioral: Negative.        Objective:   Physical Exam  Constitutional: She is oriented to person, place, and time. She appears well-developed and well-nourished. No distress.  HENT:  Head: Normocephalic and atraumatic.  Right Ear: External ear normal.  Left Ear: External ear normal.  Pharyngeal erythema. No exudate.  Cardiovascular: Normal rate, regular rhythm and normal heart sounds.  Exam reveals no gallop.   No murmur heard. Pulmonary/Chest: Effort normal and breath sounds normal. No respiratory distress. She has no wheezes. She  has no rales.  Lymphadenopathy:    She has no cervical adenopathy.  Neurological: She is alert and oriented to person, place, and time.  Skin: Skin is warm and dry.  Psychiatric: She has a normal mood and affect. Her behavior is normal. Judgment and thought content normal.          Assessment & Plan:

## 2013-06-17 NOTE — Patient Instructions (Signed)
  Start Robitussin AC for severe cough. This contains codeine and may cause sedation. Do not drive while taking this medication  Drink fluids to maintain hydration.  For your eyes:    Apply cool compress  Apply eye drops in both eyes 3 times a day for 7 days.   Report any vision changes, pain in the eye or any other unusual or uncomfortable feeling in your  Eyes immediately

## 2013-06-17 NOTE — Assessment & Plan Note (Signed)
Followup visit. Patient significantly improved. No fever. Ongoing cough. Start Robitussin a.c. 3 times a day as needed for severe cough.

## 2013-06-17 NOTE — Assessment & Plan Note (Addendum)
Start Polytrim ophthalmic solution. 1 drop in both eyes 3 times a day for 7 days. Apply cool compresses to both eyes 2-3 times a day. Reports visual disturbance, pain or any other unusual sensation in the eyes. Avoid wearing false eyelashes until resolution of symptoms.

## 2013-07-11 ENCOUNTER — Telehealth: Payer: Self-pay | Admitting: *Deleted

## 2013-07-11 DIAGNOSIS — G43909 Migraine, unspecified, not intractable, without status migrainosus: Secondary | ICD-10-CM

## 2013-07-11 MED ORDER — ONABOTULINUMTOXINA 100 UNITS IJ SOLR: INTRAMUSCULAR | Status: DC

## 2013-07-11 NOTE — Telephone Encounter (Signed)
Patient needs refill of botox for her upcoming appointment with Vaughan Basta.

## 2013-07-30 ENCOUNTER — Encounter: Payer: 59 | Admitting: Nurse Practitioner

## 2013-07-31 ENCOUNTER — Encounter: Payer: Self-pay | Admitting: General Practice

## 2013-08-02 ENCOUNTER — Encounter: Payer: 59 | Admitting: Nurse Practitioner

## 2013-08-04 ENCOUNTER — Encounter: Payer: Self-pay | Admitting: General Practice

## 2013-08-09 ENCOUNTER — Ambulatory Visit: Payer: Self-pay | Admitting: Internal Medicine

## 2013-08-09 ENCOUNTER — Ambulatory Visit (INDEPENDENT_AMBULATORY_CARE_PROVIDER_SITE_OTHER): Payer: 59 | Admitting: Internal Medicine

## 2013-08-09 ENCOUNTER — Encounter: Payer: Self-pay | Admitting: Internal Medicine

## 2013-08-09 VITALS — BP 138/98 | HR 71 | Temp 98.3°F | Resp 16 | Wt 207.5 lb

## 2013-08-09 DIAGNOSIS — E669 Obesity, unspecified: Secondary | ICD-10-CM

## 2013-08-09 DIAGNOSIS — E119 Type 2 diabetes mellitus without complications: Secondary | ICD-10-CM

## 2013-08-09 DIAGNOSIS — M5010 Cervical disc disorder with radiculopathy, unspecified cervical region: Secondary | ICD-10-CM

## 2013-08-09 DIAGNOSIS — M5412 Radiculopathy, cervical region: Secondary | ICD-10-CM

## 2013-08-09 DIAGNOSIS — M25519 Pain in unspecified shoulder: Secondary | ICD-10-CM

## 2013-08-09 DIAGNOSIS — G43909 Migraine, unspecified, not intractable, without status migrainosus: Secondary | ICD-10-CM

## 2013-08-09 DIAGNOSIS — R51 Headache: Secondary | ICD-10-CM

## 2013-08-09 DIAGNOSIS — R519 Headache, unspecified: Secondary | ICD-10-CM

## 2013-08-09 LAB — COMPREHENSIVE METABOLIC PANEL
ALT: 16 U/L (ref 0–35)
AST: 19 U/L (ref 0–37)
Albumin: 4.2 g/dL (ref 3.5–5.2)
Alkaline Phosphatase: 72 U/L (ref 39–117)
BUN: 10 mg/dL (ref 6–23)
CO2: 23 mEq/L (ref 19–32)
Calcium: 9.2 mg/dL (ref 8.4–10.5)
Chloride: 110 mEq/L (ref 96–112)
Creatinine, Ser: 1 mg/dL (ref 0.4–1.2)
GFR: 81.19 mL/min (ref >60.00)
Glucose, Bld: 113 mg/dL — ABNORMAL HIGH (ref 70–99)
Potassium: 3.8 mEq/L (ref 3.5–5.1)
Sodium: 139 mEq/L (ref 135–145)
Total Bilirubin: 0.6 mg/dL (ref 0.3–1.2)
Total Protein: 7.4 g/dL (ref 6.0–8.3)

## 2013-08-09 LAB — HEMOGLOBIN A1C: Hgb A1c MFr Bld: 6.1 % (ref 4.6–6.5)

## 2013-08-09 LAB — HM DIABETES FOOT EXAM: HM Diabetic Foot Exam: NORMAL

## 2013-08-09 MED ORDER — OXYCODONE-ACETAMINOPHEN 5-325 MG PO TABS: 1.0000 | ORAL_TABLET | Freq: Four times a day (QID) | ORAL | Status: DC | PRN

## 2013-08-09 MED ORDER — MELOXICAM 15 MG PO TABS: 15.0000 mg | ORAL_TABLET | Freq: Every day | ORAL | Status: DC

## 2013-08-09 MED ORDER — TRAMADOL HCL 50 MG PO TABS: 50.0000 mg | ORAL_TABLET | Freq: Three times a day (TID) | ORAL | Status: DC | PRN

## 2013-08-09 NOTE — Progress Notes (Signed)
Patient ID: [name], female   DOB: 1970-09-04, 43 y.o.   MRN: [id number]  Patient Active Problem List   Diagnosis Date Noted  . Pain in joint, shoulder region 08/11/2013  . Flu-like symptoms 06/15/2013  . Vitamin D deficiency 05/07/2013  . Migraine without aura 11/20/2012  . S/P Total Abdominal Hysterectomy and Left Salpingo-oophorectomy 10/20/2011  . Headache disorder   . Headache syndrome, complicated 07/20/2011  . Diabetes mellitus type 2, controlled 07/20/2011  . Obesity (BMI 30-39.9) 07/20/2011  . Hypertension goal BP (blood pressure) < 130/80 07/20/2011    Subjective:  CC:   Chief Complaint  Patient presents with  . Follow-up    3 month  . Diabetes    HPI:   [name] is a 43 y.o. female who presents for Follow up on DM Type 2, and other chronic conditions.  1) shoulder pain  She has been referred to PT for right shoulder pain that has been severe for the past week,  TENs machine helped for about 2 hours. The pain is worse at night and aggravated by  lifting arm over head.  Not sleeping at night.   2) Migraine Headaches persistent despite use of Zonegran and Toprol . She is receiving Botox injections by Jannifer Rodney Had MRI Brain at [facility] within the past few years but not of the cervical spine .  Has a history of DDD with prior lumbar surgery  2004  3) DM. Diet controlled.  No episodes of hypoglycemia   Past Medical History  Diagnosis Date  . Diabetes mellitus 2011    diet controlled,  gestational  . Headache disorder   . Allergy   . Anemia     Past Surgical History  Procedure Laterality Date  . Hernia repair  2003    left inguinal   . Breast surgery      right breast x 2 , benign  . Tubal ligation    . Abdominal hysterectomy  2006    heavy menses, endometriosis, l oophrectomy  . Left oophorectomy         The following portions of the patient's history were reviewed and updated as appropriate: Allergies, current medications, and  problem list.    Review of Systems:   Patient denies headache, fevers, malaise, unintentional weight loss, skin rash, eye pain, sinus congestion and sinus pain, sore throat, dysphagia,  hemoptysis , cough, dyspnea, wheezing, chest pain, palpitations, orthopnea, edema, abdominal pain, nausea, melena, diarrhea, constipation, flank pain, dysuria, hematuria, urinary  Frequency, nocturia, numbness, tingling, seizures,  Focal weakness, Loss of consciousness,  Tremor, insomnia, depression, anxiety, and suicidal ideation.     History   Social History  . Marital Status: Legally Separated    Spouse Name: N/A    Number of Children: N/A  . Years of Education: N/A   Occupational History  . Not on file.   Social History Main Topics  . Smoking status: Never Smoker   . Smokeless tobacco: Never Used  . Alcohol Use: No  . Drug Use: No  . Sexual Activity: Not Currently    Partners: Male    Birth Control/ Protection: Surgical   Other Topics Concern  . Not on file   Social History Narrative  . No narrative on file    Objective:  Filed Vitals:   08/09/13 0821  BP: 138/98  Pulse: 71  Temp: 98.3 F (36.8 C)  Resp: 16     General appearance: alert, cooperative and appears stated age Ears:  normal TM's and external ear canals both ears Throat: lips, mucosa, and tongue normal; teeth and gums normal Neck: no adenopathy, no carotid bruit, supple, symmetrical, trachea midline and thyroid not enlarged, symmetric, no tenderness/mass/nodules Back: symmetric, no curvature. ROM normal. No CVA tenderness. Lungs: clear to auscultation bilaterally Heart: regular rate and rhythm, S1, S2 normal, no murmur, click, rub or gallop Abdomen: soft, non-tender; bowel sounds normal; no masses,  no organomegaly Pulses: 2+ and symmetric Skin: Skin color, texture, turgor normal. No rashes or lesions Lymph nodes: Cervical, supraclavicular, and axillary nodes normal.  Assessment and Plan:  Obesity (BMI  30-39.9) She is not exercising due to work schedule and pain issues. I have addressed  BMI and recommended wt loss of 10% of body weight over the next 6 months using a low glycemic index diet and regular exercise a minimum of 5 days per week.    Headache disorder No significant improvement in daily headache syndrome  with trial of low dose Toprol. Topomax was not tolerated.  Not taking zonegran and receiving Botox injections .  Need to rule out cervical spine as the cause of persistent headaches.    Diabetes mellitus type 2, controlled Well-controlled on diet alone .  hemoglobin A1c has been consistently less than 7.0 .  Lab Results  Component Value Date   HGBA1C 6.1 08/09/2013    Patient is up-to-date on eye exams and foot exam was done today.  There is  no proteinuria on today's micro urinalysis .  Fasting lipids have been reviewed and statin therapy has been advised and updated according to new ACC guidelines based on patient's 10 year risk of CAD   Pain in joint, shoulder region Nontraumatic shoulder pain aggravated by abduction and forward flexion . Adhesive capsulitis vs  Rotator cuff syndrome. Vs impingement.  No improvement with PT>.   xrays and orthopedic consult.    Updated Medication List Outpatient Encounter Prescriptions as of 08/09/2013  Medication Sig  . ALPRAZolam (XANAX) 0.5 MG tablet Take 1 tablet (0.5 mg total) by mouth daily as needed for sleep. Take one by mouth as needed  . botulinum toxin Type A (BOTOX) 100 UNITS SOLR injection Bring to office to be administered.  . ergocalciferol (DRISDOL) 50000 UNITS capsule Take 1 capsule (50,000 Units total) by mouth once a week.  . isometheptene-acetaminophen-dichloralphenazone (MIDRIN) 65-325-100 MG capsule Take 1 capsule by mouth 4 (four) times daily as needed.  . meclizine (ANTIVERT) 25 MG tablet Take 25 mg by mouth 3 (three) times daily as needed for dizziness.  . metoprolol succinate (TOPROL-XL) 50 MG 24 hr tablet Take 1  tablet (50 mg total) by mouth daily. Take with or immediately following a meal.  . Omega-3 Fatty Acids (FISH OIL) 1000 MG CAPS Take 2 by mouth twice a day.  . ondansetron (ZOFRAN-ODT) 4 MG disintegrating tablet Take 4 mg by mouth every 8 (eight) hours as needed.  . orphenadrine (NORFLEX) 100 MG tablet Take 100 mg by mouth 2 (two) times daily as needed.  . zonisamide (ZONEGRAN) 100 MG capsule Take 1 capsule (100 mg total) by mouth 3 (three) times daily.  [name] albuterol (PROVENTIL HFA;VENTOLIN HFA) 108 (90 BASE) MCG/ACT inhaler One puff as needed  . guaiFENesin-codeine 100-10 MG/5ML syrup Take 5 mLs by mouth 3 (three) times daily as needed.  . meloxicam (MOBIC) 15 MG tablet Take 1 tablet (15 mg total) by mouth daily.  [name] neomycin-polymyxin-dexamethasone (MAXITROL) 0.1 % ophthalmic suspension Place 1 drop into both eyes 3 (three) times  daily.  . oxyCODONE-acetaminophen (PERCOCET/ROXICET) 5-325 MG per tablet Take 1 tablet by mouth every 6 (six) hours as needed. Take one by mouth as needed  . traMADol (ULTRAM) 50 MG tablet Take 1 tablet (50 mg total) by mouth every 8 (eight) hours as needed.  . [DISCONTINUED] oxyCODONE-acetaminophen (PERCOCET/ROXICET) 5-325 MG per tablet Take 1 tablet by mouth every 6 (six) hours as needed for pain. Take one by mouth as needed  . [DISCONTINUED] venlafaxine XR (EFFEXOR-XR) 75 MG 24 hr capsule Take 1 capsule (75 mg total) by mouth daily with breakfast.     Orders Placed This Encounter  Procedures  . DG Cervical Spine Complete  . DG Shoulder Right  . Comp Met (CMET)  . Hemoglobin A1c  . Ambulatory referral to Orthopedic Surgery  . HM DIABETES EYE EXAM    No Follow-up on file.

## 2013-08-09 NOTE — Patient Instructions (Addendum)
I have refilled your percocet to take at night.  Use the tramadol and meloxicam (one daily) for daytime pain.  Do NOT use meloxicam and ibuprofen together.  Too similar.    X rays of shoulder and cervical spine to be done at [facility].  Ortho referral in process.  Return in 3 months fasting

## 2013-08-09 NOTE — Progress Notes (Signed)
Pre-visit discussion using our clinic review tool. No additional management support is needed unless otherwise documented below in the visit note.  

## 2013-08-11 ENCOUNTER — Encounter: Payer: Self-pay | Admitting: Internal Medicine

## 2013-08-11 DIAGNOSIS — M25519 Pain in unspecified shoulder: Secondary | ICD-10-CM | POA: Insufficient documentation

## 2013-08-11 NOTE — Assessment & Plan Note (Signed)
No significant improvement in daily headache syndrome  with trial of low dose Toprol. Topomax was not tolerated.  Not taking zonegran and receiving Botox injections .  Need to rule out cervical spine as the cause of persistent headaches.

## 2013-08-11 NOTE — Assessment & Plan Note (Signed)
She is not exercising due to work schedule and pain issues. I have addressed  BMI and recommended wt loss of 10% of body weight over the next 6 months using a low glycemic index diet and regular exercise a minimum of 5 days per week.

## 2013-08-11 NOTE — Assessment & Plan Note (Signed)
Well-controlled on diet alone .  hemoglobin A1c has been consistently less than 7.0 .  Lab Results  Component Value Date   HGBA1C 6.1 08/09/2013    Patient is up-to-date on eye exams and foot exam was done today.  There is  no proteinuria on today's micro urinalysis .  Fasting lipids have been reviewed and statin therapy has been advised and updated according to new ACC guidelines based on patient's 10 year risk of CAD

## 2013-08-11 NOTE — Assessment & Plan Note (Signed)
Nontraumatic shoulder pain aggravated by abduction and forward flexion . Adhesive capsulitis vs  Rotator cuff syndrome. Vs impingement.  No improvement with PT>.   xrays and orthopedic consult.

## 2013-08-13 ENCOUNTER — Encounter: Payer: Self-pay | Admitting: Nurse Practitioner

## 2013-08-13 ENCOUNTER — Ambulatory Visit (INDEPENDENT_AMBULATORY_CARE_PROVIDER_SITE_OTHER): Payer: 59 | Admitting: Nurse Practitioner

## 2013-08-13 ENCOUNTER — Telehealth: Payer: Self-pay | Admitting: Internal Medicine

## 2013-08-13 VITALS — BP 128/90 | HR 64 | Ht 66.0 in | Wt 207.0 lb

## 2013-08-13 DIAGNOSIS — M62838 Other muscle spasm: Secondary | ICD-10-CM

## 2013-08-13 DIAGNOSIS — G43909 Migraine, unspecified, not intractable, without status migrainosus: Secondary | ICD-10-CM

## 2013-08-13 NOTE — Telephone Encounter (Signed)
Mailed unread message to pt  

## 2013-08-13 NOTE — Patient Instructions (Signed)
Migraine Headache A migraine headache is an intense, throbbing pain on one or both sides of your head. A migraine can last for 30 minutes to several hours. CAUSES  The exact cause of a migraine headache is not always known. However, a migraine may be caused when nerves in the brain become irritated and release chemicals that cause inflammation. This causes pain. Certain things may also trigger migraines, such as:  Alcohol.  Smoking.  Stress.  Menstruation.  Aged cheeses.  Foods or drinks that contain nitrates, glutamate, aspartame, or tyramine.  Lack of sleep.  Chocolate.  Caffeine.  Hunger.  Physical exertion.  Fatigue.  Medicines used to treat chest pain (nitroglycerine), birth control pills, estrogen, and some blood pressure medicines. SIGNS AND SYMPTOMS  Pain on one or both sides of your head.  Pulsating or throbbing pain.  Severe pain that prevents daily activities.  Pain that is aggravated by any physical activity.  Nausea, vomiting, or both.  Dizziness.  Pain with exposure to bright lights, loud noises, or activity.  General sensitivity to bright lights, loud noises, or smells. Before you get a migraine, you may get warning signs that a migraine is coming (aura). An aura may include:  Seeing flashing lights.  Seeing bright spots, halos, or zig-zag lines.  Having tunnel vision or blurred vision.  Having feelings of numbness or tingling.  Having trouble talking.  Having muscle weakness. DIAGNOSIS  A migraine headache is often diagnosed based on:  Symptoms.  Physical exam.  A CT scan or MRI of your head. These imaging tests cannot diagnose migraines, but they can help rule out other causes of headaches. TREATMENT Medicines may be given for pain and nausea. Medicines can also be given to help prevent recurrent migraines.  HOME CARE INSTRUCTIONS  Only take over-the-counter or prescription medicines for pain or discomfort as directed by your  health care provider. The use of long-term narcotics is not recommended.  Lie down in a dark, quiet room when you have a migraine.  Keep a journal to find out what may trigger your migraine headaches. For example, write down:  What you eat and drink.  How much sleep you get.  Any change to your diet or medicines.  Limit alcohol consumption.  Quit smoking if you smoke.  Get 7 9 hours of sleep, or as recommended by your health care provider.  Limit stress.  Keep lights dim if bright lights bother you and make your migraines worse. SEEK IMMEDIATE MEDICAL CARE IF:   Your migraine becomes severe.  You have a fever.  You have a stiff neck.  You have vision loss.  You have muscular weakness or loss of muscle control.  You start losing your balance or have trouble walking.  You feel faint or pass out.  You have severe symptoms that are different from your first symptoms. MAKE SURE YOU:   Understand these instructions.  Will watch your condition.  Will get help right away if you are not doing well or get worse. Document Released: 05/23/2005 Document Revised: 03/13/2013 Document Reviewed: 01/28/2013 [employer] Patient Information 2014 [employer].  

## 2013-08-13 NOTE — Telephone Encounter (Signed)
The cervical spine films were not able to see any major problems with the cervical spine , but an MRI is recommended for further evaluation if the sympotms persist.  Would you like to proceed?>  Regards,  Dr. Derrel Nip

## 2013-08-13 NOTE — Progress Notes (Signed)
S: Pt in office today for Botox injections. She was not able to tolerate Effexor due to side effects of hot flashes and nausea. She has decided to go back on Botox for prevention. She has not had a migraine in one week she thinks because of shoulder pains. She is struggling with school.   O: Alert, oriented, NAD. Some muscle spasm in shoulders  Botox Procedure Note/ She brought her own Botox Lot # [id number] Expiration Date Feb 2017  Botox Dosing by [facility] [facility] for Chronic Migraine   Injection Sites for Migraines  Botox 100 units was injected using the dosage in the table above in the pattern shown above.  A: Migraine  Muscle spasm   P: Botox 100 units injected today.  RTC 3 Months.

## 2013-08-14 ENCOUNTER — Telehealth: Payer: Self-pay

## 2013-08-14 NOTE — Telephone Encounter (Signed)
Relevant patient education assigned to patient using Emmi. ° °

## 2013-09-14 ENCOUNTER — Emergency Department (HOSPITAL_COMMUNITY)
Admission: EM | Admit: 2013-09-14 | Discharge: 2013-09-14 | Disposition: A | Discharge status: Home / Self Care | Payer: 59 | Source: Home / Self Care | Attending: Family Medicine | Admitting: Family Medicine

## 2013-09-14 ENCOUNTER — Encounter (HOSPITAL_COMMUNITY): Payer: Self-pay | Admitting: Emergency Medicine

## 2013-09-14 DIAGNOSIS — N390 Urinary tract infection, site not specified: Secondary | ICD-10-CM

## 2013-09-14 LAB — POCT URINALYSIS DIP (DEVICE)
Bilirubin Urine: NEGATIVE
Glucose, UA: NEGATIVE mg/dL
Ketones, ur: NEGATIVE mg/dL
Nitrite: NEGATIVE
Protein, ur: 30 mg/dL — AB
Specific Gravity, Urine: >=1.03 (ref 1.005–1.030)
Urobilinogen, UA: 0.2 mg/dL (ref 0.0–1.0)
pH: 5.5 (ref 5.0–8.0)

## 2013-09-14 LAB — POCT PREGNANCY, URINE: Preg Test, Ur: NEGATIVE

## 2013-09-14 MED ORDER — CIPROFLOXACIN HCL 500 MG PO TABS: 500.0000 mg | ORAL_TABLET | Freq: Two times a day (BID) | ORAL | Status: DC

## 2013-09-14 MED ORDER — PHENAZOPYRIDINE HCL 95 MG PO TABS: 95.0000 mg | ORAL_TABLET | Freq: Three times a day (TID) | ORAL | Status: DC | PRN

## 2013-09-14 NOTE — ED Notes (Signed)
Low  abd  Pain        Poss  uti         Urinary  Frequency      With  Pain on  Urination      With  Symptoms  Started  Today           Worse  Started  Today      Symptoms  Not  releived  By  trammadol          Pt  Reports      Frequency     And  Pain

## 2013-09-14 NOTE — ED Provider Notes (Signed)
[name] is a 43 y.o. female who presents to Urgent Care today for one day of urinary frequency and urgency associated with burning with urination. Symptoms are moderate. No fevers or chills nausea vomiting or diarrhea. No vaginal discharge. Consistent with prior UTI. Patient has tried tramadol which did not help much.   Past Medical History  Diagnosis Date  . Diabetes mellitus 2011    diet controlled,  gestational  . Headache disorder   . Allergy   . Anemia    History  Substance Use Topics  . Smoking status: Never Smoker   . Smokeless tobacco: Never Used  . Alcohol Use: No   ROS as above Medications: No current facility-administered medications for this encounter.   Current Outpatient Prescriptions  Medication Sig Dispense Refill  . albuterol (PROVENTIL HFA;VENTOLIN HFA) 108 (90 BASE) MCG/ACT inhaler One puff as needed      . ALPRAZolam (XANAX) 0.5 MG tablet Take 1 tablet (0.5 mg total) by mouth daily as needed for sleep. Take one by mouth as needed  30 tablet  1  . botulinum toxin Type A (BOTOX) 100 UNITS SOLR injection Bring to office to be administered.  1 each  3  . ciprofloxacin (CIPRO) 500 MG tablet Take 1 tablet (500 mg total) by mouth 2 (two) times daily.  10 tablet  0  . ergocalciferol (DRISDOL) 50000 UNITS capsule Take 1 capsule (50,000 Units total) by mouth once a week.  12 capsule  0  . guaiFENesin-codeine 100-10 MG/5ML syrup Take 5 mLs by mouth 3 (three) times daily as needed.  120 mL  0  . isometheptene-acetaminophen-dichloralphenazone (MIDRIN) 65-325-100 MG capsule Take 1 capsule by mouth 4 (four) times daily as needed.  60 capsule  3  . meclizine (ANTIVERT) 25 MG tablet Take 25 mg by mouth 3 (three) times daily as needed for dizziness.      . meloxicam (MOBIC) 15 MG tablet Take 1 tablet (15 mg total) by mouth daily.  30 tablet  0  . metoprolol succinate (TOPROL-XL) 50 MG 24 hr tablet Take 1 tablet (50 mg total) by mouth daily. Take with or immediately  following a meal.  30 tablet  11  . neomycin-polymyxin-dexamethasone (MAXITROL) 0.1 % ophthalmic suspension Place 1 drop into both eyes 3 (three) times daily.  5 mL  0  . Omega-3 Fatty Acids (FISH OIL) 1000 MG CAPS Take 2 by mouth twice a day.      . ondansetron (ZOFRAN-ODT) 4 MG disintegrating tablet Take 4 mg by mouth every 8 (eight) hours as needed.      . orphenadrine (NORFLEX) 100 MG tablet Take 100 mg by mouth 2 (two) times daily as needed.      [name] oxyCODONE-acetaminophen (PERCOCET/ROXICET) 5-325 MG per tablet Take 1 tablet by mouth every 6 (six) hours as needed. Take one by mouth as needed  30 tablet  0  . phenazopyridine (PYRIDIUM) 95 MG tablet Take 1 tablet (95 mg total) by mouth 3 (three) times daily as needed for pain.  10 tablet  0  . traMADol (ULTRAM) 50 MG tablet Take 1 tablet (50 mg total) by mouth every 8 (eight) hours as needed.  30 tablet  0  . zonisamide (ZONEGRAN) 100 MG capsule Take 1 capsule (100 mg total) by mouth 3 (three) times daily.  90 capsule  6    Exam:  BP 170/107  Pulse 78  Temp(Src) 98 F (36.7 C) (Oral)  Resp 16  SpO2 100% Gen: Well NAD HEENT:  EOMI,  MMM Lungs: Normal work of breathing. CTABL Heart: RRR no MRG Abd: NABS, Soft. NT, ND no CV angle tenderness to percussion Exts: Brisk capillary refill, warm and well perfused.   Results for orders placed during the hospital encounter of 09/14/13 (from the past 24 hour(s))  POCT URINALYSIS DIP (DEVICE)     Status: Abnormal   Collection Time    09/14/13  7:38 PM      Result Value Ref Range   Glucose, UA NEGATIVE  NEGATIVE mg/dL   Bilirubin Urine NEGATIVE  NEGATIVE   Ketones, ur NEGATIVE  NEGATIVE mg/dL   Specific Gravity, Urine >=1.030  1.005 - 1.030   Hgb urine dipstick LARGE (*) NEGATIVE   pH 5.5  5.0 - 8.0   Protein, ur 30 (*) NEGATIVE mg/dL   Urobilinogen, UA 0.2  0.0 - 1.0 mg/dL   Nitrite NEGATIVE  NEGATIVE   Leukocytes, UA SMALL (*) NEGATIVE  POCT PREGNANCY, URINE     Status: None   Collection  Time    09/14/13  7:39 PM      Result Value Ref Range   Preg Test, Ur NEGATIVE  NEGATIVE   No results found.  Assessment and Plan: 43 y.o. female with urinary tract infection. Plan to treat with Cipro given the patient's allergy to penicillin. Urine culture pending.  Discussed warning signs or symptoms. Please see discharge instructions. Patient expresses understanding.    Gregor Hams, MD 09/14/13 2023

## 2013-09-14 NOTE — Discharge Instructions (Signed)
Thank you for coming in today. Take Cipro twice daily for 5 days. Use Pyridium as needed for discomfort. Come back as needed If your belly pain worsens, or you have high fever, bad vomiting, blood in your stool or black tarry stool go to the Emergency Room.   Urinary Tract Infection Urinary tract infections (UTIs) can develop anywhere along your urinary tract. Your urinary tract is your body's drainage system for removing wastes and extra water. Your urinary tract includes two kidneys, two ureters, a bladder, and a urethra. Your kidneys are a pair of bean-shaped organs. Each kidney is about the size of your fist. They are located below your ribs, one on each side of your spine. CAUSES Infections are caused by microbes, which are microscopic organisms, including fungi, viruses, and bacteria. These organisms are so small that they can only be seen through a microscope. Bacteria are the microbes that most commonly cause UTIs. SYMPTOMS  Symptoms of UTIs may vary by age and gender of the patient and by the location of the infection. Symptoms in young women typically include a frequent and intense urge to urinate and a painful, burning feeling in the bladder or urethra during urination. Older women and men are more likely to be tired, shaky, and weak and have muscle aches and abdominal pain. A fever may mean the infection is in your kidneys. Other symptoms of a kidney infection include pain in your back or sides below the ribs, nausea, and vomiting. DIAGNOSIS To diagnose a UTI, your caregiver will ask you about your symptoms. Your caregiver also will ask to provide a urine sample. The urine sample will be tested for bacteria and white blood cells. White blood cells are made by your body to help fight infection. TREATMENT  Typically, UTIs can be treated with medication. Because most UTIs are caused by a bacterial infection, they usually can be treated with the use of antibiotics. The choice of antibiotic and  length of treatment depend on your symptoms and the type of bacteria causing your infection. HOME CARE INSTRUCTIONS  If you were prescribed antibiotics, take them exactly as your caregiver instructs you. Finish the medication even if you feel better after you have only taken some of the medication.  Drink enough water and fluids to keep your urine clear or pale yellow.  Avoid caffeine, tea, and carbonated beverages. They tend to irritate your bladder.  Empty your bladder often. Avoid holding urine for long periods of time.  Empty your bladder before and after sexual intercourse.  After a bowel movement, women should cleanse from front to back. Use each tissue only once. SEEK MEDICAL CARE IF:   You have back pain.  You develop a fever.  Your symptoms do not begin to resolve within 3 days. SEEK IMMEDIATE MEDICAL CARE IF:   You have severe back pain or lower abdominal pain.  You develop chills.  You have nausea or vomiting.  You have continued burning or discomfort with urination. MAKE SURE YOU:   Understand these instructions.  Will watch your condition.  Will get help right away if you are not doing well or get worse. Document Released: 03/02/2005 Document Revised: 11/22/2011 Document Reviewed: 07/01/2011 [facility] Patient Information 2014 [employer].

## 2013-09-26 ENCOUNTER — Encounter: Payer: Self-pay | Admitting: Internal Medicine

## 2013-10-11 ENCOUNTER — Encounter: Payer: Self-pay | Admitting: Nurse Practitioner

## 2013-11-14 ENCOUNTER — Ambulatory Visit: Payer: 59 | Admitting: Internal Medicine

## 2013-11-18 ENCOUNTER — Encounter: Payer: Self-pay | Admitting: Internal Medicine

## 2013-11-18 ENCOUNTER — Ambulatory Visit (INDEPENDENT_AMBULATORY_CARE_PROVIDER_SITE_OTHER): Payer: 59 | Admitting: Internal Medicine

## 2013-11-18 VITALS — BP 132/86 | HR 63 | Temp 98.1°F | Resp 16 | Ht 66.0 in | Wt 213.5 lb

## 2013-11-18 DIAGNOSIS — E119 Type 2 diabetes mellitus without complications: Secondary | ICD-10-CM

## 2013-11-18 DIAGNOSIS — I10 Essential (primary) hypertension: Secondary | ICD-10-CM

## 2013-11-18 MED ORDER — SPIRONOLACTONE 25 MG PO TABS: 25.0000 mg | ORAL_TABLET | Freq: Every day | ORAL | Status: DC

## 2013-11-18 NOTE — Patient Instructions (Signed)
We are adding low dose spironolactone for BP and fluid retention  You can Increase to 50 mg if no improvement in one week (recheck BP and increase  If > 130/80)  Fasting labs at [facility] due in 3 months  Don't forget to get your eyes examined

## 2013-11-18 NOTE — Progress Notes (Signed)
Patient ID: [name], female   DOB: Oct 03, 1970, 43 y.o.   MRN: [id number]  Patient Active Problem List   Diagnosis Date Noted  . Pain in joint, shoulder region 08/11/2013  . Vitamin D deficiency 05/07/2013  . Migraine without aura 11/20/2012  . S/P Total Abdominal Hysterectomy and Left Salpingo-oophorectomy 10/20/2011  . Headache disorder   . Headache syndrome, complicated 06/08/7251  . Diabetes mellitus type 2, controlled 07/20/2011  . Obesity (BMI 30-39.9) 07/20/2011  . Hypertension goal BP (blood pressure) < 130/80 07/20/2011    Subjective:  CC:   Chief Complaint  Patient presents with  . Follow-up  . Diabetes  . Hypertension    HPI:   [name] is a 43 y.o. female who presents for Follow up on  Hypertension and diet controlled diabetes    had shoulder eval by [facility] and noted a significant improvement in pain and function after taking a prescribed steroid dose pack.  Has been postponing steroid intraarticular injection.  She has been having elevated blood pressure readings which are  aggravated by headache,  Now 138/88  She has been following a low glycemic index diet for diet controlled DM   Past Medical History  Diagnosis Date  . Diabetes mellitus 2011    diet controlled,  gestational  . Headache disorder   . Allergy   . Anemia     Past Surgical History  Procedure Laterality Date  . Hernia repair  2003    left inguinal   . Breast surgery      right breast x 2 , benign  . Tubal ligation    . Abdominal hysterectomy  2006    heavy menses, endometriosis, l oophrectomy  . Left oophorectomy         The following portions of the patient's history were reviewed and updated as appropriate: Allergies, current medications, and problem list.    Review of Systems:   Patient denies headache, fevers, malaise, unintentional weight loss, skin rash, eye pain, sinus congestion and sinus pain, sore throat, dysphagia,  hemoptysis ,  cough, dyspnea, wheezing, chest pain, palpitations, orthopnea, edema, abdominal pain, nausea, melena, diarrhea, constipation, flank pain, dysuria, hematuria, urinary  Frequency, nocturia, numbness, tingling, seizures,  Focal weakness, Loss of consciousness,  Tremor, insomnia, depression, anxiety, and suicidal ideation.     History   Social History  . Marital Status: Legally Separated    Spouse Name: N/A    Number of Children: N/A  . Years of Education: N/A   Occupational History  . Not on file.   Social History Main Topics  . Smoking status: Never Smoker   . Smokeless tobacco: Never Used  . Alcohol Use: No  . Drug Use: No  . Sexual Activity: Not Currently    Partners: Male    Birth Control/ Protection: Surgical   Other Topics Concern  . Not on file   Social History Narrative  . No narrative on file    Objective:  Filed Vitals:   11/18/13 1810  BP: 132/86  Pulse: 63  Temp: 98.1 F (36.7 C)  Resp: 16     General appearance: alert, cooperative and appears stated age Ears: normal TM's and external ear canals both ears Throat: lips, mucosa, and tongue normal; teeth and gums normal Neck: no adenopathy, no carotid bruit, supple, symmetrical, trachea midline and thyroid not enlarged, symmetric, no tenderness/mass/nodules Back: symmetric, no curvature. ROM normal. No CVA tenderness. Lungs: clear to auscultation bilaterally Heart: regular rate and rhythm,  S1, S2 normal, no murmur, click, rub or gallop Abdomen: soft, non-tender; bowel sounds normal; no masses,  no organomegaly Pulses: 2+ and symmetric Skin: Skin color, texture, turgor normal. No rashes or lesions Lymph nodes: Cervical, supraclavicular, and axillary nodes normal.  Assessment and Plan:  Hypertension goal BP (blood pressure) < 130/80 Adding spironolactone 25 mg daily for improved control.  History of hyponatremia/hypokalemia with prior trial of hctz  Diabetes mellitus type 2, controlled Well-controlled  on diet alone .  hemoglobin A1c has been consistently less than 7.0 . repeat A1c is due.  Lab Results  Component Value Date   HGBA1C 6.1 08/09/2013    Patient is up-to-date on eye exams and foot exam was done today.  There is  no proteinuria on today's micro urinalysis .  Fasting lipids have been reviewed and statin therapy has been advised and updated according to new ACC guidelines based on patient's 10 year risk of CAD      Updated Medication List Outpatient Encounter Prescriptions as of 11/18/2013  Medication Sig  . ALPRAZolam (XANAX) 0.5 MG tablet Take 1 tablet (0.5 mg total) by mouth daily as needed for sleep. Take one by mouth as needed  . botulinum toxin Type A (BOTOX) 100 UNITS SOLR injection Bring to office to be administered.  . isometheptene-acetaminophen-dichloralphenazone (MIDRIN) 65-325-100 MG capsule Take 1 capsule by mouth 4 (four) times daily as needed.  . meclizine (ANTIVERT) 25 MG tablet Take 25 mg by mouth 3 (three) times daily as needed for dizziness.  . meloxicam (MOBIC) 15 MG tablet Take 1 tablet (15 mg total) by mouth daily.  . metoprolol succinate (TOPROL-XL) 50 MG 24 hr tablet Take 1 tablet (50 mg total) by mouth daily. Take with or immediately following a meal.  . ondansetron (ZOFRAN-ODT) 4 MG disintegrating tablet Take 4 mg by mouth every 8 (eight) hours as needed.  . orphenadrine (NORFLEX) 100 MG tablet Take 100 mg by mouth 2 (two) times daily as needed.  [name] oxyCODONE-acetaminophen (PERCOCET/ROXICET) 5-325 MG per tablet Take 1 tablet by mouth every 6 (six) hours as needed. Take one by mouth as needed  . traMADol (ULTRAM) 50 MG tablet Take 1 tablet (50 mg total) by mouth every 8 (eight) hours as needed.  . zonisamide (ZONEGRAN) 100 MG capsule Take 1 capsule (100 mg total) by mouth 3 (three) times daily.  [name] albuterol (PROVENTIL HFA;VENTOLIN HFA) 108 (90 BASE) MCG/ACT inhaler One puff as needed  . ergocalciferol (DRISDOL) 50000 UNITS capsule Take 1 capsule (50,000  Units total) by mouth once a week.  . phenazopyridine (PYRIDIUM) 95 MG tablet Take 1 tablet (95 mg total) by mouth 3 (three) times daily as needed for pain.  [name] spironolactone (ALDACTONE) 25 MG tablet Take 1 tablet (25 mg total) by mouth daily.  . [DISCONTINUED] ciprofloxacin (CIPRO) 500 MG tablet Take 1 tablet (500 mg total) by mouth 2 (two) times daily.  . [DISCONTINUED] guaiFENesin-codeine 100-10 MG/5ML syrup Take 5 mLs by mouth 3 (three) times daily as needed.  . [DISCONTINUED] neomycin-polymyxin-dexamethasone (MAXITROL) 0.1 % ophthalmic suspension Place 1 drop into both eyes 3 (three) times daily.  . [DISCONTINUED] Omega-3 Fatty Acids (FISH OIL) 1000 MG CAPS Take 2 by mouth twice a day.     No orders of the defined types were placed in this encounter.    No Follow-up on file.

## 2013-11-18 NOTE — Progress Notes (Signed)
Pre-visit discussion using our clinic review tool. No additional management support is needed unless otherwise documented below in the visit note.  

## 2013-11-19 LAB — HM DIABETES FOOT EXAM: HM Diabetic Foot Exam: NORMAL

## 2013-11-19 NOTE — Assessment & Plan Note (Signed)
Adding spironolactone 25 mg daily for improved control.  History of hyponatremia/hypokalemia with prior trial of hctz

## 2013-11-19 NOTE — Assessment & Plan Note (Signed)
Well-controlled on diet alone .  hemoglobin A1c has been consistently less than 7.0 . repeat A1c is due.  Lab Results  Component Value Date   HGBA1C 6.1 08/09/2013    Patient is up-to-date on eye exams and foot exam was done today.  There is  no proteinuria on today's micro urinalysis .  Fasting lipids have been reviewed and statin therapy has been advised and updated according to new ACC guidelines based on patient's 10 year risk of CAD

## 2013-11-22 ENCOUNTER — Ambulatory Visit: Payer: 59 | Admitting: Internal Medicine

## 2013-11-26 ENCOUNTER — Encounter: Payer: Self-pay | Admitting: Nurse Practitioner

## 2013-11-26 ENCOUNTER — Encounter: Payer: 59 | Admitting: Nurse Practitioner

## 2013-11-26 ENCOUNTER — Ambulatory Visit (INDEPENDENT_AMBULATORY_CARE_PROVIDER_SITE_OTHER): Payer: 59 | Admitting: Nurse Practitioner

## 2013-11-26 VITALS — BP 129/93 | HR 62 | Ht 66.0 in | Wt 212.4 lb

## 2013-11-26 DIAGNOSIS — M62838 Other muscle spasm: Secondary | ICD-10-CM

## 2013-11-26 DIAGNOSIS — G43709 Chronic migraine without aura, not intractable, without status migrainosus: Secondary | ICD-10-CM

## 2013-11-26 MED ORDER — ZONISAMIDE 100 MG PO CAPS: 100.0000 mg | ORAL_CAPSULE | Freq: Four times a day (QID) | ORAL | Status: DC

## 2013-11-26 MED ORDER — ORPHENADRINE CITRATE ER 100 MG PO TB12: 100.0000 mg | ORAL_TABLET | Freq: Two times a day (BID) | ORAL | Status: DC | PRN

## 2013-11-26 MED ORDER — ISOMETHEPTENE-APAP-DICHLORAL 65-325-100 MG PO CAPS: 1.0000 | ORAL_CAPSULE | Freq: Four times a day (QID) | ORAL | Status: DC | PRN

## 2013-11-26 NOTE — Patient Instructions (Signed)

## 2013-11-26 NOTE — Progress Notes (Signed)
S: Pt in office today for Botox injections. Her Botox has been working very well for migraine prevention up until one month ago when the headaches came back daily. She is using 100 units as needed. She is interested in going up on Zonegran. She also needs refills on Norflex and Midrin. School is out till fall.   O: Well developed, well nourished, AA female, NAD Muscles in neck and traps are very tight  Botox Procedure Note/ Pt brought her own Botox to office today Lot # [id number] Expiration Date Jan 2018  Botox Dosing by [facility] [facility] for Chronic Migraine   Injection Sites for Migraines  Botox 100 units was injected using the dosage in the table above in the pattern shown above.  A: Migraine  Muscle spasm   P: Botox 100 units injected today.  Refill Midrin and Norflex Increase Zonegran to 400 mg daily and will increase by 100 mg monthly RTC 3 Months.

## 2013-11-27 ENCOUNTER — Encounter: Payer: Self-pay | Admitting: *Deleted

## 2013-11-27 ENCOUNTER — Ambulatory Visit (INDEPENDENT_AMBULATORY_CARE_PROVIDER_SITE_OTHER): Payer: 59 | Admitting: *Deleted

## 2013-11-27 DIAGNOSIS — G43809 Other migraine, not intractable, without status migrainosus: Secondary | ICD-10-CM

## 2013-11-27 DIAGNOSIS — G43019 Migraine without aura, intractable, without status migrainosus: Secondary | ICD-10-CM

## 2013-11-27 MED ORDER — KETOROLAC TROMETHAMINE 60 MG/2ML IM SOLN
60.0000 mg | Freq: Once | INTRAMUSCULAR | Status: AC
  Administered 2013-11-27: 60 mg via INTRAMUSCULAR

## 2013-11-27 NOTE — Progress Notes (Signed)
Pt came in today to receive a Toradol injection due to migraine.

## 2013-11-28 ENCOUNTER — Ambulatory Visit: Payer: 59 | Admitting: Internal Medicine

## 2013-12-17 ENCOUNTER — Encounter: Payer: Self-pay | Admitting: Internal Medicine

## 2013-12-18 ENCOUNTER — Other Ambulatory Visit: Payer: Self-pay | Admitting: Internal Medicine

## 2013-12-18 MED ORDER — LISINOPRIL 10 MG PO TABS: 10.0000 mg | ORAL_TABLET | Freq: Every day | ORAL | Status: DC

## 2014-01-14 ENCOUNTER — Encounter: Payer: Self-pay | Admitting: Internal Medicine

## 2014-01-16 ENCOUNTER — Other Ambulatory Visit: Payer: Self-pay | Admitting: Nurse Practitioner

## 2014-01-16 ENCOUNTER — Telehealth: Payer: Self-pay | Admitting: Internal Medicine

## 2014-01-16 DIAGNOSIS — Z0279 Encounter for issue of other medical certificate: Secondary | ICD-10-CM

## 2014-01-16 NOTE — Telephone Encounter (Signed)
Physical form is ready for pickup

## 2014-01-17 ENCOUNTER — Encounter: Payer: Self-pay | Admitting: Internal Medicine

## 2014-01-17 NOTE — Telephone Encounter (Signed)
Spoke with pt, form faxed to her as requested.

## 2014-01-20 MED ORDER — METOPROLOL SUCCINATE ER 50 MG PO TB24: 50.0000 mg | ORAL_TABLET | Freq: Every day | ORAL | Status: DC

## 2014-01-28 ENCOUNTER — Ambulatory Visit: Payer: Self-pay | Admitting: General Practice

## 2014-02-04 ENCOUNTER — Ambulatory Visit: Payer: Self-pay | Admitting: General Practice

## 2014-03-31 ENCOUNTER — Telehealth: Payer: Self-pay | Admitting: Nurse Practitioner

## 2014-03-31 ENCOUNTER — Encounter: Payer: Self-pay | Admitting: Nurse Practitioner

## 2014-03-31 NOTE — Telephone Encounter (Signed)
Patient needs her Zonisamide refilled.  She states the 6/15 Rx never when thru to the pharmacy.

## 2014-04-01 MED ORDER — ZONISAMIDE 100 MG PO CAPS: 100.0000 mg | ORAL_CAPSULE | Freq: Four times a day (QID) | ORAL | Status: DC

## 2014-04-01 NOTE — Telephone Encounter (Signed)
Resent rx with refills to pharmacy.

## 2014-04-01 NOTE — Addendum Note (Signed)
Addended by: Erik Obey on: 04/01/2014 10:24 AM   Modules accepted: Orders

## 2014-04-07 ENCOUNTER — Encounter: Payer: Self-pay | Admitting: *Deleted

## 2014-04-28 ENCOUNTER — Encounter: Payer: Self-pay | Admitting: *Deleted

## 2014-04-28 ENCOUNTER — Ambulatory Visit (INDEPENDENT_AMBULATORY_CARE_PROVIDER_SITE_OTHER): Payer: 59 | Admitting: *Deleted

## 2014-04-28 DIAGNOSIS — G43101 Migraine with aura, not intractable, with status migrainosus: Secondary | ICD-10-CM

## 2014-04-28 MED ORDER — KETOROLAC TROMETHAMINE 60 MG/2ML IM SOLN
60.0000 mg | Freq: Once | INTRAMUSCULAR | Status: AC
  Administered 2014-04-28: 60 mg via INTRAMUSCULAR

## 2014-04-28 NOTE — Progress Notes (Signed)
Patient is having significant migraine and is requesting a shot of torodol and a note to be out of work today.  She said that she had a bad reaction to the botox the last time she came to have that administered so she does not wish to take it again.  She had dizziness shortness of breath and bradycardia.  She will go home and ice her head after her injection today.

## 2014-05-12 ENCOUNTER — Ambulatory Visit: Payer: Self-pay | Admitting: Internal Medicine

## 2014-05-12 LAB — HM MAMMOGRAPHY: HM Mammogram: NEGATIVE

## 2014-05-13 ENCOUNTER — Encounter: Payer: Self-pay | Admitting: *Deleted

## 2014-05-19 ENCOUNTER — Ambulatory Visit: Payer: 59 | Admitting: Internal Medicine

## 2014-05-26 ENCOUNTER — Encounter: Payer: Self-pay | Admitting: Internal Medicine

## 2014-06-10 ENCOUNTER — Ambulatory Visit (INDEPENDENT_AMBULATORY_CARE_PROVIDER_SITE_OTHER): Payer: 59 | Admitting: Internal Medicine

## 2014-06-10 ENCOUNTER — Encounter: Payer: Self-pay | Admitting: *Deleted

## 2014-06-10 ENCOUNTER — Encounter: Payer: Self-pay | Admitting: Internal Medicine

## 2014-06-10 VITALS — BP 136/82 | HR 73 | Temp 98.6°F | Resp 16 | Ht 66.0 in | Wt 211.0 lb

## 2014-06-10 DIAGNOSIS — E785 Hyperlipidemia, unspecified: Secondary | ICD-10-CM

## 2014-06-10 DIAGNOSIS — E119 Type 2 diabetes mellitus without complications: Secondary | ICD-10-CM

## 2014-06-10 DIAGNOSIS — G4459 Other complicated headache syndrome: Secondary | ICD-10-CM

## 2014-06-10 DIAGNOSIS — Z23 Encounter for immunization: Secondary | ICD-10-CM

## 2014-06-10 DIAGNOSIS — E1169 Type 2 diabetes mellitus with other specified complication: Secondary | ICD-10-CM

## 2014-06-10 DIAGNOSIS — E669 Obesity, unspecified: Secondary | ICD-10-CM

## 2014-06-10 DIAGNOSIS — I10 Essential (primary) hypertension: Secondary | ICD-10-CM

## 2014-06-10 MED ORDER — TRAMADOL HCL 50 MG PO TABS: 50.0000 mg | ORAL_TABLET | Freq: Three times a day (TID) | ORAL | Status: DC | PRN

## 2014-06-10 MED ORDER — ALPRAZOLAM 0.5 MG PO TABS: 0.5000 mg | ORAL_TABLET | Freq: Every day | ORAL | Status: DC | PRN

## 2014-06-10 MED ORDER — OXYCODONE-ACETAMINOPHEN 5-325 MG PO TABS: 1.0000 | ORAL_TABLET | Freq: Four times a day (QID) | ORAL | Status: DC | PRN

## 2014-06-10 MED ORDER — METOPROLOL SUCCINATE ER 50 MG PO TB24: 50.0000 mg | ORAL_TABLET | Freq: Every day | ORAL | Status: DC

## 2014-06-10 MED ORDER — MELOXICAM 15 MG PO TABS: 15.0000 mg | ORAL_TABLET | Freq: Every day | ORAL | Status: DC

## 2014-06-10 NOTE — Progress Notes (Signed)
Pre-visit discussion using our clinic review tool. No additional management support is needed unless otherwise documented below in the visit note.  

## 2014-06-10 NOTE — Progress Notes (Signed)
Patient ID: [name], female   DOB: 31-May-1971, 44 y.o.   MRN: [id number]   Patient Active Problem List   Diagnosis Date Noted  . Hyperlipidemia associated with type 2 diabetes mellitus 06/13/2014  . Pain in joint, shoulder region 08/11/2013  . Vitamin D deficiency 05/07/2013  . S/P Total Abdominal Hysterectomy and Left Salpingo-oophorectomy 10/20/2011  . Headache disorder   . Headache syndrome, complicated 27/74/1287  . Diabetes mellitus type 2, controlled 07/20/2011  . Obesity (BMI 30-39.9) 07/20/2011  . Hypertension goal BP (blood pressure) < 130/80 07/20/2011    Subjective:  CC:   Chief Complaint  Patient presents with  . Follow-up  . Diabetes  . Hypertension    HPI:   [name] is a 44 y.o. female who presents for  Follow up on DM,  Hypertension, and headache disorder.  Last seen June 2015, last A1c march 2015. [name]  Feels generally well except that she is recovering from a viral URI last week over Christmas.    Had an adverse reaction to Botox injections which she had been receiving every 3 months for the last 1.5 years.  Reaction occurred within minits of l her most recent injectio and included flushing, bradycardia to HR of 48,  Presyncope and diiaphoresis, accompanied by chest tightness.  She has not returned to headache specialist Monna Fam and is requesting that I refill her zonegram  And prn percocet.   Does not check blood sugars,  Not exercising regularly due to works schedule,  Single mom status, etc. Weight is stable.  Does not check blood sugars unless feeling shaky,  Which is rare.   Wt Readings from Last 3 Encounters:  06/10/14 211 lb (95.709 kg)  11/26/13 212 lb 6.4 oz (96.344 kg)  11/18/13 213 lb 8 oz (96.843 kg)     Past Medical History  Diagnosis Date  . Diabetes mellitus 2011    diet controlled,  gestational  . Headache disorder   . Allergy   . Anemia     Past Surgical History  Procedure Laterality Date  . Hernia repair   2003    left inguinal   . Breast surgery      right breast x 2 , benign  . Tubal ligation    . Abdominal hysterectomy  2006    heavy menses, endometriosis, l oophrectomy  . Left oophorectomy         The following portions of the patient's history were reviewed and updated as appropriate: Allergies, current medications, and problem list.    Review of Systems:   Patient denies headache, fevers, malaise, unintentional weight loss, skin rash, eye pain, sinus congestion and sinus pain, sore throat, dysphagia,  hemoptysis , cough, dyspnea, wheezing, chest pain, palpitations, orthopnea, edema, abdominal pain, nausea, melena, diarrhea, constipation, flank pain, dysuria, hematuria, urinary  Frequency, nocturia, numbness, tingling, seizures,  Focal weakness, Loss of consciousness,  Tremor, insomnia, depression, anxiety, and suicidal ideation.     History   Social History  . Marital Status: Married    Spouse Name: N/A    Number of Children: N/A  . Years of Education: N/A   Occupational History  . Not on file.   Social History Main Topics  . Smoking status: Never Smoker   . Smokeless tobacco: Never Used  . Alcohol Use: No  . Drug Use: No  . Sexual Activity:    Partners: Male    Birth Control/ Protection: Surgical   Other Topics Concern  . Not  on file   Social History Narrative    Objective:  Filed Vitals:   06/10/14 1428  BP: 136/82  Pulse: 73  Temp: 98.6 F (37 C)  Resp: 16     General appearance: alert, cooperative and appears stated age Ears: normal TM's and external ear canals both ears Throat: lips, mucosa, and tongue normal; teeth and gums normal Neck: no adenopathy, no carotid bruit, supple, symmetrical, trachea midline and thyroid not enlarged, symmetric, no tenderness/mass/nodules Back: symmetric, no curvature. ROM normal. No CVA tenderness. Lungs: clear to auscultation bilaterally Heart: regular rate and rhythm, S1, S2 normal, no murmur, click, rub or  gallop Abdomen: soft, non-tender; bowel sounds normal; no masses,  no organomegaly Pulses: 2+ and symmetric Skin: Skin color, texture, turgor normal. No rashes or lesions Lymph nodes: Cervical, supraclavicular, and axillary nodes normal. Foot exam:  Nails are well trimmed,  No callouses,  Sensation intact to microfilament   Assessment and Plan:  Problem List Items Addressed This Visit    Diabetes mellitus type 2, controlled    Well-controlled on diet alone .  hemoglobin A1c has been consistently less than 7.0 . Since diagnosis.   Lab Results  Component Value Date   HGBA1C 6.9* 06/10/2014   Lab Results  Component Value Date   MICROALBUR 1.5 06/10/2014    Patient is up-to-date on eye exams and foot exam was done today.  There is  no proteinuria on today's micro urinalysis .         Relevant Medications      simvastatin (ZOCOR) tablet   Headache syndrome, complicated    No significant improvement in daily headache syndrome  with trial of low dose Toprol. Topomax was not tolerated.  Now taking zonegran, Toprol, meloxicam.   no longer receiving Botox injections due to recent anaphylactic reaction.   Refill percocet for rare use      Relevant Medications      oxyCODONE-acetaminophen (PERCOCET/ROXICET) 5-325 MG per tablet      traMADol (ULTRAM) 50 MG tablet      metoprolol succinate (TOPROL-XL) 24 hr tablet      meloxicam (MOBIC) tablet   Hyperlipidemia associated with type 2 diabetes mellitus    New ACC guidelines recommend starting patients aged 43 or higher on moderate intensity statin therapy for diabetes and concurrent LDL between 70-189. Will recommend trial of simvastatin.   Lab Results  Component Value Date   CHOL 185 05/06/2013   HDL 45.00 05/06/2013   LDLCALC 120* 05/06/2013   LDLDIRECT 136.3 06/10/2014   TRIG 100.0 05/06/2013   CHOLHDL 4 05/06/2013       Relevant Medications      metoprolol succinate (TOPROL-XL) 24 hr tablet      simvastatin (ZOCOR) tablet    Hypertension goal BP (blood pressure) < 130/80    Well controlled on current regimen. Renal function stable, no changes today.  Lab Results  Component Value Date   CREATININE 1.0 06/10/2014   Lab Results  Component Value Date   NA 140 06/10/2014   K 3.6 06/10/2014   CL 110 06/10/2014   CO2 26 06/10/2014       Relevant Medications      metoprolol succinate (TOPROL-XL) 24 hr tablet      simvastatin (ZOCOR) tablet   Obesity (BMI 30-39.9)    I have addressed  BMI and recommended a low glycemic index diet utilizing smaller more frequent meals to increase metabolism.  I have also recommended that patient start exercising with a  goal of 30 minutes of aerobic exercise a minimum of 5 days per week.   Body mass index is 34.07 kg/(m^2).        Other Visit Diagnoses    Diabetes mellitus without complication    -  Primary    Relevant Medications       simvastatin (ZOCOR) tablet    Other Relevant Orders       Comprehensive metabolic panel (Completed)       Hemoglobin A1c (Completed)       LDL cholesterol, direct (Completed)       Microalbumin / creatinine urine ratio (Completed)    Need for prophylactic vaccination against Streptococcus pneumoniae (pneumococcus)        Relevant Orders       Pneumococcal polysaccharide vaccine 23-valent greater than or equal to 2yo subcutaneous/IM (Completed)

## 2014-06-10 NOTE — Patient Instructions (Signed)
Diabetes and Standards of Medical Care Diabetes is complicated. You may find that your diabetes team includes a dietitian, nurse, diabetes educator, eye doctor, and more. To help everyone know what is going on and to help you get the care you deserve, the following schedule of care was developed to help keep you on track. Below are the tests, exams, vaccines, medicines, education, and plans you will need. HbA1c test This test shows how well you have controlled your glucose over the past 2-3 months. It is used to see if your diabetes management plan needs to be adjusted.   It is performed at least 2 times a year if you are meeting treatment goals.  It is performed 4 times a year if therapy has changed or if you are not meeting treatment goals. Blood pressure test  This test is performed at every routine medical visit. The goal is less than 140/90 mm Hg for most people, but 130/80 mm Hg in some cases. Ask your health care provider about your goal. Dental exam  Follow up with the dentist regularly. Eye exam  If you are diagnosed with type 1 diabetes as a child, get an exam upon reaching the age of 10 years or older and have had diabetes for 3-5 years. Yearly eye exams are recommended after that initial eye exam.  If you are diagnosed with type 1 diabetes as an adult, get an exam within 5 years of diagnosis and then yearly.  If you are diagnosed with type 2 diabetes, get an exam as soon as possible after the diagnosis and then yearly. Foot care exam  Visual foot exams are performed at every routine medical visit. The exams check for cuts, injuries, or other problems with the feet.  A comprehensive foot exam should be done yearly. This includes visual inspection as well as assessing foot pulses and testing for loss of sensation.  Check your feet nightly for cuts, injuries, or other problems with your feet. Tell your health care provider if anything is not healing. Kidney function test (urine  microalbumin)  This test is performed once a year.  Type 1 diabetes: The first test is performed 5 years after diagnosis.  Type 2 diabetes: The first test is performed at the time of diagnosis.  A serum creatinine and estimated glomerular filtration rate (eGFR) test is done once a year to assess the level of chronic kidney disease (CKD), if present. Lipid profile (cholesterol, HDL, LDL, triglycerides)  Performed every 5 years for most people.  The goal for LDL is less than 100 mg/dL. If you are at high risk, the goal is less than 70 mg/dL.  The goal for HDL is 40 mg/dL-50 mg/dL for men and 50 mg/dL-60 mg/dL for women. An HDL cholesterol of 60 mg/dL or higher gives some protection against heart disease.  The goal for triglycerides is less than 150 mg/dL. Influenza vaccine, pneumococcal vaccine, and hepatitis B vaccine  The influenza vaccine is recommended yearly.  It is recommended that people with diabetes who are over 65 years old get the pneumonia vaccine. In some cases, two separate shots may be given. Ask your health care provider if your pneumonia vaccination is up to date.  The hepatitis B vaccine is also recommended for adults with diabetes. Diabetes self-management education  Education is recommended at diagnosis and ongoing as needed. Treatment plan  Your treatment plan is reviewed at every medical visit. Document Released: 03/20/2009 Document Revised: 10/07/2013 Document Reviewed: 10/23/2012 Exit[employer] Patient Information 2015 [employer]   [employer]. This information is not intended to replace advice given to you by your health care provider. Make sure you discuss any questions you have with your health care provider.  

## 2014-06-11 LAB — LDL CHOLESTEROL, DIRECT: Direct LDL: 136.3 mg/dL

## 2014-06-11 LAB — COMPREHENSIVE METABOLIC PANEL
ALT: 17 U/L (ref 0–35)
AST: 15 U/L (ref 0–37)
Albumin: 4.3 g/dL (ref 3.5–5.2)
Alkaline Phosphatase: 85 U/L (ref 39–117)
BUN: 14 mg/dL (ref 6–23)
CO2: 26 mEq/L (ref 19–32)
Calcium: 9.5 mg/dL (ref 8.4–10.5)
Chloride: 110 mEq/L (ref 96–112)
Creatinine, Ser: 1 mg/dL (ref 0.4–1.2)
GFR: 76.27 mL/min (ref >60.00)
Glucose, Bld: 140 mg/dL — ABNORMAL HIGH (ref 70–99)
Potassium: 3.6 mEq/L (ref 3.5–5.1)
Sodium: 140 mEq/L (ref 135–145)
Total Bilirubin: 0.6 mg/dL (ref 0.2–1.2)
Total Protein: 7.2 g/dL (ref 6.0–8.3)

## 2014-06-11 LAB — HEMOGLOBIN A1C: Hgb A1c MFr Bld: 6.9 % — ABNORMAL HIGH (ref 4.6–6.5)

## 2014-06-11 LAB — MICROALBUMIN / CREATININE URINE RATIO
Creatinine,U: 123 mg/dL
Microalb Creat Ratio: 1.2 mg/g (ref 0.0–30.0)
Microalb, Ur: 1.5 mg/dL (ref 0.0–1.9)

## 2014-06-13 ENCOUNTER — Telehealth: Payer: Self-pay | Admitting: Internal Medicine

## 2014-06-13 ENCOUNTER — Encounter: Payer: Self-pay | Admitting: Internal Medicine

## 2014-06-13 DIAGNOSIS — E1169 Type 2 diabetes mellitus with other specified complication: Secondary | ICD-10-CM | POA: Insufficient documentation

## 2014-06-13 DIAGNOSIS — E785 Hyperlipidemia, unspecified: Secondary | ICD-10-CM

## 2014-06-13 MED ORDER — SIMVASTATIN 20 MG PO TABS: 20.0000 mg | ORAL_TABLET | Freq: Every day | ORAL | Status: DC

## 2014-06-13 NOTE — Assessment & Plan Note (Signed)
I have addressed  BMI and recommended a low glycemic index diet utilizing smaller more frequent meals to increase metabolism.  I have also recommended that patient start exercising with a goal of 30 minutes of aerobic exercise a minimum of 5 days per week.   Body mass index is 34.07 kg/(m^2).

## 2014-06-13 NOTE — Telephone Encounter (Signed)
Advice noted.

## 2014-06-13 NOTE — Telephone Encounter (Signed)
Patient was just seen.  i'll address the labs in a my chart message now that I have access .

## 2014-06-13 NOTE — Assessment & Plan Note (Signed)
Well-controlled on diet alone .  hemoglobin A1c has been consistently less than 7.0 . Since diagnosis.   Lab Results  Component Value Date   HGBA1C 6.9* 06/10/2014   Lab Results  Component Value Date   MICROALBUR 1.5 06/10/2014    Patient is up-to-date on eye exams and foot exam was done today.  There is  no proteinuria on today's micro urinalysis .

## 2014-06-13 NOTE — Assessment & Plan Note (Signed)
New ACC guidelines recommend starting patients aged 45 or higher on moderate intensity statin therapy for diabetes and concurrent LDL between 70-189. Will recommend trial of simvastatin.   Lab Results  Component Value Date   CHOL 185 05/06/2013   HDL 45.00 05/06/2013   LDLCALC 120* 05/06/2013   LDLDIRECT 136.3 06/10/2014   TRIG 100.0 05/06/2013   CHOLHDL 4 05/06/2013

## 2014-06-13 NOTE — Assessment & Plan Note (Signed)
Well controlled on current regimen. Renal function stable, no changes today.  Lab Results  Component Value Date   CREATININE 1.0 06/10/2014   Lab Results  Component Value Date   NA 140 06/10/2014   K 3.6 06/10/2014   CL 110 06/10/2014   CO2 26 06/10/2014

## 2014-06-13 NOTE — Assessment & Plan Note (Addendum)
No significant improvement in daily headache syndrome  with trial of low dose Toprol. Topomax was not tolerated.  Now taking zonegran, Toprol, meloxicam.   no longer receiving Botox injections due to recent anaphylactic reaction.   Refill percocet for rare use

## 2014-07-21 ENCOUNTER — Encounter (HOSPITAL_COMMUNITY): Payer: Self-pay | Admitting: *Deleted

## 2014-07-21 ENCOUNTER — Ambulatory Visit (INDEPENDENT_AMBULATORY_CARE_PROVIDER_SITE_OTHER): Payer: 59 | Admitting: Family Medicine

## 2014-07-21 ENCOUNTER — Encounter: Payer: Self-pay | Admitting: *Deleted

## 2014-07-21 ENCOUNTER — Emergency Department (HOSPITAL_COMMUNITY)
Admission: EM | Admit: 2014-07-21 | Discharge: 2014-07-21 | Disposition: A | Discharge status: Home / Self Care | Payer: 59 | Source: Home / Self Care

## 2014-07-21 ENCOUNTER — Encounter: Payer: Self-pay | Admitting: Family Medicine

## 2014-07-21 VITALS — BP 118/76 | HR 71 | Ht 66.0 in | Wt 212.0 lb

## 2014-07-21 DIAGNOSIS — S060X0A Concussion without loss of consciousness, initial encounter: Secondary | ICD-10-CM

## 2014-07-21 DIAGNOSIS — W19XXXA Unspecified fall, initial encounter: Secondary | ICD-10-CM

## 2014-07-21 DIAGNOSIS — M25511 Pain in right shoulder: Secondary | ICD-10-CM

## 2014-07-21 DIAGNOSIS — S060X9A Concussion with loss of consciousness of unspecified duration, initial encounter: Secondary | ICD-10-CM

## 2014-07-21 DIAGNOSIS — R51 Headache: Secondary | ICD-10-CM

## 2014-07-21 DIAGNOSIS — Z87820 Personal history of traumatic brain injury: Secondary | ICD-10-CM | POA: Insufficient documentation

## 2014-07-21 DIAGNOSIS — R519 Headache, unspecified: Secondary | ICD-10-CM

## 2014-07-21 MED ORDER — CYCLOBENZAPRINE HCL 10 MG PO TABS: 5.0000 mg | ORAL_TABLET | Freq: Three times a day (TID) | ORAL | Status: DC | PRN

## 2014-07-21 NOTE — Progress Notes (Signed)
Pre visit review using our clinic review tool, if applicable. No additional management support is needed unless otherwise documented below in the visit note. 

## 2014-07-21 NOTE — Discharge Instructions (Signed)
Thank you for coming in today. Take ibuprofen, tylenol or aleve for headache.  Go to the emergency room if your headache becomes excruciating or you have weakness or numbness or uncontrolled vomiting.   Go home and rest. Do not drive until feeling better.  Go to the ER if you get worse.   Follow up with Dr. Tamala Julian.   Concussion Direct trauma to the head often causes a condition known as a concussion. This injury can temporarily interfere with brain function and may cause you to pass out (lose consciousness). The consequences of a concussion are usually short-term, but repetitive concussions can be very dangerous. If you have multiple concussions, you will have a greater risk of long-term effects, such as slurred speech, slow movements, impaired thinking, or tremors. The severity of a concussion is based on the length and severity of the interference with brain activity. SYMPTOMS  Symptoms of a concussion vary depending on the severity of the injury. Very mild concussions may even occur without any noticeable symptoms. Swelling in the area of the injury is not related to the seriousness of the injury.   Mild concussion:  Temporary loss of consciousness may or may not occur.  Memory loss (amnesia) for a short time.  Emotional instability.  Confusion.  Severe concussion:  Usually prolonged loss of consciousness.  Confusion  One pupil (the black part in the middle of the eye) is larger than the other.  Changes in vision (including blurring).  Changes in breathing.  Disturbed balance (equilibrium).  Headaches.  Confusion.  Nausea or vomiting.  Slower reaction time than normal.  Difficulty learning and remembering things you have heard. CAUSES  A concussion is the result of trauma to the head. When the head is subjected to such an injury, the brain strikes against the inner wall of the skull. This impact is what causes the damage to the brain. The force of injury is related  to severity of injury. The most severe concussions are associated with incidents that involve large impact forces such as motor vehicle accidents. Wearing a helmet will reduce the severity of trauma to the head, but concussions may still occur if you are wearing a helmet. RISK INCREASES WITH:  Contact sports (football, hockey, soccer, rugby, basketball or lacrosse).  Fighting sports (martial arts or boxing).  Riding bicycles, motorcycles, or horses (when you ride without a helmet). PREVENTION  Wear proper protective headgear and ensure correct fit.  Wear seat belts when driving and riding in a car.  Do not drink or use mind-altering drugs and drive. PROGNOSIS  Concussions are typically curable if they are recognized and treated early. If a severe concussion or multiple concussions go untreated, then the complications may be life-threatening or cause permanent disability and brain damage. RELATED COMPLICATIONS   Permanent brain damage (slurred speech, slow movement, impaired thinking, or tremors).  Bleeding under the skull (subdural hemorrhage or hematoma, epidural hematoma).  Bleeding into the brain.  Prolonged healing time if usual activities are resumed too soon.  Infection if skin over the concussion site is broken.  Increased risk of future concussions (less trauma is required for a second concussion than the first). TREATMENT  Treatment initially requires immediate evaluation to determine the severity of the concussion. Occasionally, a hospital stay may be required for observation and treatment.  Avoid exertion. Bed rest for the first 24-48 hours is recommended.  Return to play is a controversial subject due to the increased risk for future injury as well as permanent  disability and should be discussed at length with your treating caregiver. Many factors such as the severity of the concussion and whether this is the first, second, or third concussion play a role in timing a  patient's return to sports.  MEDICATION  Do not give any medicine, including non-prescription acetaminophen or aspirin, until the diagnosis is certain. These medicines may mask developing symptoms.  SEEK IMMEDIATE MEDICAL CARE IF:   Symptoms get worse or do not improve in 24 hours.  Any of the following symptoms occur:  Vomiting.  The inability to move arms and legs equally well on both sides.  Fever.  Neck stiffness.  Pupils of unequal size, shape, or reactivity.  Convulsions.  Noticeable restlessness.  Severe headache that persists for longer than 4 hours after injury.  Confusion, disorientation, or mental status changes. Document Released: 05/23/2005 Document Revised: 03/13/2013 Document Reviewed: 09/04/2008 [facility] Patient Information 2015 [place], [place]. This information is not intended to replace advice given to you by your health care provider. Make sure you discuss any questions you have with your health care provider.   Rotator Cuff Injury Rotator cuff injury is any type of injury to the set of muscles and tendons that make up the stabilizing unit of your shoulder. This unit holds the ball of your upper arm bone (humerus) in the socket of your shoulder blade (scapula).  CAUSES Injuries to your rotator cuff most commonly come from sports or activities that cause your arm to be moved repeatedly over your head. Examples of this include throwing, weight lifting, swimming, or racquet sports. Long lasting (chronic) irritation of your rotator cuff can cause soreness and swelling (inflammation), bursitis, and eventual damage to your tendons, such as a tear (rupture). SIGNS AND SYMPTOMS Acute rotator cuff tear:  Sudden tearing sensation followed by severe pain shooting from your upper shoulder down your arm toward your elbow.  Decreased range of motion of your shoulder because of pain and muscle spasm.  Severe pain.  Inability to raise your arm out to the side because of  pain and loss of muscle power (large tears). Chronic rotator cuff tear:  Pain that usually is worse at night and may interfere with sleep.  Gradual weakness and decreased shoulder motion as the pain worsens.  Decreased range of motion. Rotator cuff tendinitis:  Deep ache in your shoulder and the outside upper arm over your shoulder.  Pain that comes on gradually and becomes worse when lifting your arm to the side or turning it inward. DIAGNOSIS Rotator cuff injury is diagnosed through a medical history, physical exam, and imaging exam. The medical history helps determine the type of rotator cuff injury. Your health care provider will look at your injured shoulder, feel the injured area, and ask you to move your shoulder in different positions. X-ray exams typically are done to rule out other causes of shoulder pain, such as fractures. MRI is the exam of choice for the most severe shoulder injuries because the images show muscles and tendons.  TREATMENT  Chronic tear:  Medicine for pain, such as acetaminophen or ibuprofen.  Physical therapy and range-of-motion exercises may be helpful in maintaining shoulder function and strength.  Steroid injections into your shoulder joint.  Surgical repair of the rotator cuff if the injury does not heal with noninvasive treatment. Acute tear:  Anti-inflammatory medicines such as ibuprofen and naproxen to help reduce pain and swelling.  A sling to help support your arm and rest your rotator cuff muscles. Long-term use of  a sling is not advised. It may cause significant stiffening of the shoulder joint.  Surgery may be considered within a few weeks, especially in younger, active people, to return the shoulder to full function.  Indications for surgical treatment include the following:  Age younger than 3 years.  Rotator cuff tears that are complete.  Physical therapy, rest, and anti-inflammatory medicines have been used for 6-8 weeks, with no  improvement.  Employment or sporting activity that requires constant shoulder use. Tendinitis:  Anti-inflammatory medicines such as ibuprofen and naproxen to help reduce pain and swelling.  A sling to help support your arm and rest your rotator cuff muscles. Long-term use of a sling is not advised. It may cause significant stiffening of the shoulder joint.  Severe tendinitis may require:  Steroid injections into your shoulder joint.  Physical therapy.  Surgery. HOME CARE INSTRUCTIONS   Apply ice to your injury:  Put ice in a plastic bag.  Place a towel between your skin and the bag.  Leave the ice on for 20 minutes, 2-3 times a day.  If you have a shoulder immobilizer (sling and straps), wear it until told otherwise by your health care provider.  You may want to sleep on several pillows or in a recliner at night to lessen swelling and pain.  Only take over-the-counter or prescription medicines for pain, discomfort, or fever as directed by your health care provider.  Do simple hand squeezing exercises with a soft rubber ball to decrease hand swelling. SEEK MEDICAL CARE IF:   Your shoulder pain increases, or new pain or numbness develops in your arm, hand, or fingers.  Your hand or fingers are colder than your other hand. SEEK IMMEDIATE MEDICAL CARE IF:   Your arm, hand, or fingers are numb or tingling.  Your arm, hand, or fingers are increasingly swollen and painful, or they turn white or blue. MAKE SURE YOU:  Understand these instructions.  Will watch your condition.  Will get help right away if you are not doing well or get worse. Document Released: 05/20/2000 Document Revised: 05/28/2013 Document Reviewed: 01/02/2013 [facility] Patient Information 2015 [place], [place]. This information is not intended to replace advice given to you by your health care provider. Make sure you discuss any questions you have with your health care provider.

## 2014-07-21 NOTE — ED Notes (Signed)
Pt      Reports    She  [place]  [place]         She  States  She  Hit  Her  Head             And  Pain  r  Side     /  Shoulder   And   r    Hip      -  She  Ambulated  To  Room       She  Is  Sitting  Upright on  Exam table  Speaking in  Complete  sentances     Did  Not  Black  Out          She  Reports  being  Dizzy

## 2014-07-21 NOTE — ED Provider Notes (Signed)
[name] is a 44 y.o. female who presents to Urgent Care today for fall and head injury. Patient slipped and fell last night at the parking lot of her apartment building landing on the posterior aspect of her head. She denies any loss of consciousness. She notes headache right-sided neck pain and right shoulder pain. She denies any radiating pain weakness or numbness. She notes mild photophobia and a foggy feeling. Her headache is slightly improved today compared to yesterday. She was able to drive herself to work this morning.   Past Medical History  Diagnosis Date  . Diabetes mellitus 2011    diet controlled,  gestational  . Headache disorder   . Allergy   . Anemia    Past Surgical History  Procedure Laterality Date  . Hernia repair  2003    left inguinal   . Breast surgery      right breast x 2 , benign  . Tubal ligation    . Abdominal hysterectomy  2006    heavy menses, endometriosis, l oophrectomy  . Left oophorectomy     History  Substance Use Topics  . Smoking status: Never Smoker   . Smokeless tobacco: Never Used  . Alcohol Use: No   ROS as above Medications: No current facility-administered medications for this encounter.   Current Outpatient Prescriptions  Medication Sig Dispense Refill  . albuterol (PROVENTIL HFA;VENTOLIN HFA) 108 (90 BASE) MCG/ACT inhaler One puff as needed    . ALPRAZolam (XANAX) 0.5 MG tablet Take 1 tablet (0.5 mg total) by mouth daily as needed for sleep. Take one by mouth as needed 30 tablet 1  . botulinum toxin Type A (BOTOX) 100 UNITS SOLR injection Bring to office to be administered. (Patient not taking: Reported on 06/10/2014) 1 each 3  . isometheptene-acetaminophen-dichloralphenazone (MIDRIN) 65-325-100 MG capsule Take 1 capsule by mouth 4 (four) times daily as needed. 60 capsule 3  . lisinopril (PRINIVIL,ZESTRIL) 10 MG tablet Take 1 tablet (10 mg total) by mouth daily. 90 tablet 3  . meclizine (ANTIVERT) 25 MG tablet Take 25 mg by  mouth 3 (three) times daily as needed for dizziness.    . meloxicam (MOBIC) 15 MG tablet Take 1 tablet (15 mg total) by mouth daily. 30 tablet 4  . metoprolol succinate (TOPROL-XL) 50 MG 24 hr tablet Take 1 tablet (50 mg total) by mouth daily. Take with or immediately following a meal. 90 tablet 1  . ondansetron (ZOFRAN-ODT) 4 MG disintegrating tablet Take 4 mg by mouth every 8 (eight) hours as needed.    . orphenadrine (NORFLEX) 100 MG tablet Take 1 tablet (100 mg total) by mouth 2 (two) times daily as needed. 60 tablet 1  . oxyCODONE-acetaminophen (PERCOCET/ROXICET) 5-325 MG per tablet Take 1 tablet by mouth every 6 (six) hours as needed. Take one by mouth as needed 30 tablet 0  . simvastatin (ZOCOR) 20 MG tablet Take 1 tablet (20 mg total) by mouth at bedtime. 90 tablet 3  . traMADol (ULTRAM) 50 MG tablet Take 1 tablet (50 mg total) by mouth every 8 (eight) hours as needed. 30 tablet 3  . zonisamide (ZONEGRAN) 100 MG capsule Take 1 capsule (100 mg total) by mouth 4 (four) times daily. 120 capsule 6   Allergies  Allergen Reactions  . Botox [Onabotulinumtoxina] Shortness Of Breath    syncope  . Maxalt [Rizatriptan Benzoate] Anaphylaxis    Chest pain  . Nitrous Oxide Shortness Of Breath    syncope  . Aspirin  Mouth blisters  . Latex     Sometimes causes rash  . Vicodin [Hydrocodone-Acetaminophen]     hallucinations  . Penicillins Rash  . Tetracyclines & Related Rash     Exam:  BP 137/96 mmHg  Pulse 69  Temp(Src) 98.1 F (36.7 C) (Oral)  Resp 18  Ht 5\' 6"  (1.676 m)  Wt 212 lb (96.163 kg)  BMI 34.23 kg/m2  SpO2 98% Gen: Well NAD HEENT: EOMI,  MMM, PERRLA.  Lungs: Normal work of breathing. CTABL Heart: RRR no MRG Exts: Brisk capillary refill, warm and well perfused.  Neck: Nontender to spinal midline normal neck range of motion Neuro sensation is intact throughout upper and lower extremities. Reflexes are equal and normal bilateral upper and lower extremities Strength  is intact throughout Coordination is intact Balance is slightly impaired especially with one leg stance and tandem stance Memory is intact to 3 item recall immediate and delayed Patient is unable to perform serial sevens or months of the year backwards correctly Normal gait Right shoulder normal-appearing nontender patient expresses pain with range of motion of abduction beyond 100 both actively and passively. She displays positive impingement testing. Strength is intact but painful.  Left shoulder is nontender with normal motion negative impingement testing    No results found for this or any previous visit (from the past 24 hour(s)). No results found.  Assessment and Plan: 44 y.o. female with slip and fall at her apartment building parking lot 1) concussion. Patient certainly displays the signs and symptoms of a concussion. I am very doubtful for other serious intracranial etiology. Her symptoms are slightly improved and she does not have any focal neurologic exam findings. Recommend return to home with cognitive rest and NSAIDs for pain control. Patient states that her employer will drive her home today. I recommend against driving until she is no longer feeling dizzy or foggy. 2) right shoulder injury.  I suspect patient has a rotator cuff strain. It's possible she has a tear as she is guarding somewhat with exam today   I have provided patient with a work note and recommended she follow up with sports medicine to continue management of both her concussion in her shoulder injury.  Discussed warning signs or symptoms. Please see discharge instructions. Patient expresses understanding.     Gregor Hams, MD 07/21/14 [phone]

## 2014-07-21 NOTE — Assessment & Plan Note (Signed)
She does have a concussion. Patient was given over-the-counter suggestions as well as a trial of oral anti-inflammatories. Patient given a muscle relaxer if needed. Discussed with patient that this likely will take another 48 hours to completely heal. Patient will limit the activities that she is doing for the next 48 hours and then start increasing slowly. Patient will return to work on Wednesday and will see me in one week to make sure that she improves.

## 2014-07-21 NOTE — Progress Notes (Signed)
[facility] [place], [place] [address] Phone: [phone] Subjective:    I'm seeing this patient by the request  of:  Georgina Snell, MD  CC: Possible concussion after fall  [id number] [name] is a 44 y.o. female coming in with complaint of headache after fall on ice. Patient does have a past medical history significant for headache disorder. Patient was seen in urgent care and there is a concern for patient possibly having concussion. Patient was sent here for further evaluation. Patient states patient was walking last night and unfortunately fell hitting the back of her head on cement. Patient denies any loss of consciousness. States that she did fall more on the right side. Patient felt somewhat groggy and had a significant headache. Patient did go to work today but had difficulty with concentration. Patient states that she is having no visual changes, any nausea or vomiting, landing weird sensations in her extremities. Patient states that she just does not feel like herself. Did take some ibuprofen and tramadol with some mild pain relief. Has never had a head injury before.     Past medical history, social, surgical and family history all reviewed in electronic medical record.   Review of Systems: No headache, visual changes, nausea, vomiting, diarrhea, constipation, dizziness, abdominal pain, skin rash, fevers, chills, night sweats, weight loss, swollen lymph nodes, body aches, joint swelling, muscle aches, chest pain, shortness of breath, mood changes.   Objective Blood pressure 118/76, pulse 71, height 5\' 6"  (1.676 m), weight 212 lb (96.163 kg), SpO2 97 %.  General: No apparent distress alert and oriented x3 mood and affect normal, dressed appropriately.  HEENT: Pupils equal, extraocular movements intact  Respiratory: Patient's speak in full sentences and does not appear short of breath  Cardiovascular: No lower extremity edema, non  tender, no erythema  Skin: Warm dry intact with no signs of infection or rash on extremities or on axial skeleton.  Abdomen: Soft nontender  Neuro: Cranial nerves II through XII are intact, neurovascularly intact in all extremities with 2+ DTRs and 2+ pulses.  Lymph: No lymphadenopathy of posterior or anterior cervical chain or axillae bilaterally.  Gait normal with good balance and coordination.  MSK:  Non tender with full range of motion and good stability and symmetric strength and tone of shoulders, elbows, wrist, hip, knee and ankles bilaterally.   Neck: Inspection unremarkable. No palpable stepoffs. Negative Spurling's maneuver. Full neck range of motion Grip strength and sensation normal in bilateral hands Strength good C4 to T1 distribution No sensory change to C4 to T1 Negative Hoffman sign bilaterally Reflexes normal  Symptom score? (22)      17 Severity score ? (132)    118   Oirentation?(5)  5 M / D/ W / Y /  Time  Immediate memory ?  5 pt each series Cat  ..........Marland KitchenCandle .... Baby Apple ........Marland KitchenPaper...... .Monkey Orange...Marland KitchenMarland KitchenMarland Kitchen Sugar..... Perfume Ball...........[name] Wagon ... [employer]..........[name]  Insect ..... Iron     Trial #1. (5)  5        Trial #2  (5)  4      Trial #3  (5)  4   TOTAL points: (15)  13       Concentration ? (5)3 Digits backward: 1 point each series  2-5-3          (3-5-2) 7-1-6-4       (4-6-1-7)  4-0-3-8        (8-3-0-4) 2-5-3-8-0     (  0-8-3-5-2)  Reverse months (1) 1   Co-ordination score (1) 1  5 correct FNF repositioning in < 4 seconds = 1  Delayed recall: ? (5)   3  Patient does have nystagmus with tracking.    Impression and Recommendations:     This case required medical decision making of moderate complexity.

## 2014-07-21 NOTE — Patient Instructions (Addendum)
Nice to meet you [place] 20 minutes 2 times daily. Usually after activity and before bed. Good to see you I do think you have a concussion.  I would like you to limit screen time (including your phone) to 30 minutes daily outside of work until symptom free.  In addition to this I recommend......  To help improve COGNITIVE function: Using fish oil/omega 3 that is 1000 mg (or roughly 600 mg EPA/DHA), starting as soon as possible after concussion, take: 3 tabs THREE TIMES a day  for the first 3 days, then (you will smell a little, sory) 3 tabs TWICE DAILY  for the next 3 days, then 3 tabs ONCE DAILY  for the next 10 days   To help reduce HEADACHES: Coenzyme Q10 160mg  ONCE DAILY Riboflavin/Vitamin B2 400mg  ONCE DAILY Magnesium oxide 400mg  ONCE - TWICE DAILY May stop after headaches are resolved.                                                                                             To help with INSOMNIA: Melatonin 3-5mg  AT BEDTIME     Other medicines to help decrease inflammation Alpha Lipoic Acid 100mg  TWICE DAILY Turmeric 500mg  twice daily  For muscle spasm try flexeril 5 mg at night or in the day but be careful.  Try ibuprofen 800mg  up to 3 times a day for 3 days if needed   I want to see you again in 1 week to make sure you are doing better.

## 2014-07-28 ENCOUNTER — Encounter: Payer: Self-pay | Admitting: Family Medicine

## 2014-07-28 ENCOUNTER — Ambulatory Visit (INDEPENDENT_AMBULATORY_CARE_PROVIDER_SITE_OTHER): Payer: 59 | Admitting: Family Medicine

## 2014-07-28 VITALS — BP 120/84 | HR 62 | Wt 214.0 lb

## 2014-07-28 DIAGNOSIS — S060X9D Concussion with loss of consciousness of unspecified duration, subsequent encounter: Secondary | ICD-10-CM

## 2014-07-28 NOTE — Patient Instructions (Signed)
You are doing great You are good to go No restrictions See me when you need me.

## 2014-07-28 NOTE — Progress Notes (Signed)
  [facility] [address] [place], [place] [address] Phone: [phone] Subjective:     CC: Possible concussion after fall follow-up  [id number] [name] is a 44 y.o. female coming in with complaint of headache after fall on ice. Patient does have a past medical history significant for headache disorder. Patient was seen previously and did have a concussion. Patient states that the headache that was associated with concussion has gone away significantly. Patient denies any numbness or tingling, states that she can focus. Patient has been working fairly regularly now without any difficulties.     Past medical history, social, surgical and family history all reviewed in electronic medical record.   Review of Systems: No headache, visual changes, nausea, vomiting, diarrhea, constipation, dizziness, abdominal pain, skin rash, fevers, chills, night sweats, weight loss, swollen lymph nodes, body aches, joint swelling, muscle aches, chest pain, shortness of breath, mood changes.   Objective Blood pressure 120/84, pulse 62, weight 214 lb (97.07 kg).  General: No apparent distress alert and oriented x3 mood and affect normal, dressed appropriately.  HEENT: Pupils equal, extraocular movements intact  Respiratory: Patient's speak in full sentences and does not appear short of breath  Cardiovascular: No lower extremity edema, non tender, no erythema  Skin: Warm dry intact with no signs of infection or rash on extremities or on axial skeleton.  Abdomen: Soft nontender  Neuro: Cranial nerves II through XII are intact, neurovascularly intact in all extremities with 2+ DTRs and 2+ pulses.  Lymph: No lymphadenopathy of posterior or anterior cervical chain or axillae bilaterally.  Gait normal with good balance and coordination.  MSK:  Non tender with full range of motion and good stability and symmetric strength and tone of shoulders, elbows, wrist, hip, knee and  ankles bilaterally.   Neck: Inspection unremarkable. No palpable stepoffs. Negative Spurling's maneuver. Full neck range of motion Grip strength and sensation normal in bilateral hands Strength good C4 to T1 distribution No sensory change to C4 to T1 Negative Hoffman sign bilaterally Reflexes normal   Oirentation?(5)  5 M / D/ W / Y /  Time  Immediate memory ?  5 pt each series Cat  ..........Marland KitchenCandle .... Baby Apple ........Marland KitchenPaper...... .Monkey Orange...Marland KitchenMarland KitchenMarland Kitchen Sugar..... Perfume Ball...........[name] Wagon ... [employer]..........[name]  Insect ..... Iron     Trial #1. (5)  5        Trial #2  (5)  4      Trial #3  (5)  4   TOTAL points: (15) 15       Concentration ? (5)5 Digits backward: 1 point each series  2-5-3          (3-5-2) 7-1-6-4       (4-6-1-7)  4-0-3-8        (8-3-0-4) 2-5-3-8-0     (0-8-3-5-2)  Reverse months (1) 1   Co-ordination score (1) 1  5 correct FNF repositioning in < 4 seconds = 1  Delayed recall: ? (5)   5  No nystagmus regular repetitive motion   Impression and Recommendations:     This case required medical decision making of moderate complexity.

## 2014-07-28 NOTE — Assessment & Plan Note (Signed)
Patient is doing significant better at this time. Discussed icing regimen as well as the other over-the-counter medications a could be beneficial. I think patient is going to do fine and can follow-up with me as needed. Patient was given a note at work with no restrictions.  Spent  25 minutes with patient face-to-face and had greater than 50% of counseling including as described above in assessment and plan.

## 2014-09-08 ENCOUNTER — Encounter: Payer: Self-pay | Admitting: Internal Medicine

## 2014-09-08 MED ORDER — METOPROLOL SUCCINATE ER 50 MG PO TB24: 50.0000 mg | ORAL_TABLET | Freq: Every day | ORAL | Status: DC

## 2014-09-16 ENCOUNTER — Ambulatory Visit (INDEPENDENT_AMBULATORY_CARE_PROVIDER_SITE_OTHER): Payer: 59 | Admitting: *Deleted

## 2014-09-16 DIAGNOSIS — G43019 Migraine without aura, intractable, without status migrainosus: Secondary | ICD-10-CM | POA: Diagnosis not present

## 2014-09-16 MED ORDER — KETOROLAC TROMETHAMINE 60 MG/2ML IM SOLN
60.0000 mg | Freq: Once | INTRAMUSCULAR | Status: AC
  Administered 2014-09-16: 60 mg via INTRAMUSCULAR

## 2014-09-16 NOTE — Progress Notes (Signed)
Pt here today for a Toradol injection. Patient has a severe migraine.

## 2014-10-16 ENCOUNTER — Encounter: Payer: Self-pay | Admitting: Obstetrics & Gynecology

## 2014-10-16 ENCOUNTER — Ambulatory Visit (INDEPENDENT_AMBULATORY_CARE_PROVIDER_SITE_OTHER): Payer: 59 | Admitting: Obstetrics & Gynecology

## 2014-10-16 VITALS — BP 153/95 | HR 80 | Ht 66.0 in | Wt 217.4 lb

## 2014-10-16 DIAGNOSIS — Z90710 Acquired absence of both cervix and uterus: Secondary | ICD-10-CM

## 2014-10-16 DIAGNOSIS — Z01419 Encounter for gynecological examination (general) (routine) without abnormal findings: Secondary | ICD-10-CM | POA: Diagnosis not present

## 2014-10-16 DIAGNOSIS — R232 Flushing: Secondary | ICD-10-CM | POA: Diagnosis not present

## 2014-10-16 DIAGNOSIS — N9089 Other specified noninflammatory disorders of vulva and perineum: Secondary | ICD-10-CM | POA: Diagnosis not present

## 2014-10-16 NOTE — Progress Notes (Signed)
Patient ID: [name], female   DOB: [birth year], 44 y.o.   MRN: [id number] Subjective:     CELLIE DARDIS is a 44 y.o. female here for a routine exam.  Current complaints: pt is worried about a lesion on her vulva.  Pt is worried that the lesion could be a condyloma.      Gynecologic History No LMP recorded. Patient has had a hysterectomy. Contraception: status post hysterectomy Last Pap: n/a.  Last mammogram: Dec 2015. Results were: normal  Obstetric History OB History  Gravida Para Term Preterm AB SAB TAB Ectopic Multiple Living  3 2 2  1   1  2     # Outcome Date GA Lbr Len/2nd Weight Sex Delivery Anes PTL Lv  3 Term           2 Term           1 Ectopic                The following portions of the patient's history were reviewed and updated as appropriate: allergies, current medications, past family history, past medical history, past social history, past surgical history and problem list.  Review of Systems A comprehensive review of systems was negative.    Objective:    BP 153/95 mmHg  Pulse 80  Ht 5\' 6"  (1.676 m)  Wt 217 lb 6.4 oz (98.612 kg)  BMI 35.11 kg/m2  General Appearance:    Alert, cooperative, no distress, appears stated age  Head:    Normocephalic, without obvious abnormality, atraumatic     Ears:    Normal TM's and external ear canals, both ears  Nose:   Nares normal, septum midline, mucosa normal, no drainage    or sinus tenderness  Throat:   Lips, mucosa, and tongue normal; teeth and gums normal  Neck:   Supple, symmetrical, trachea midline, no adenopathy;    thyroid:  no enlargement/tenderness/nodules; no carotid   bruit or JVD  Back:     Symmetric, no curvature, ROM normal, no CVA tenderness  Lungs:     Clear to auscultation bilaterally, respirations unlabored  Chest Wall:    No tenderness or deformity   Heart:    Regular rate and rhythm, S1 and S2 normal, no murmur, rub   or gallop  Breast Exam:    No tenderness, masses, or nipple  abnormality  Abdomen:     Soft, non-tender, bowel sounds active all four quadrants,    no masses, no organomegaly  Genitalia:    Normal female without lesion, discharge or tenderness; cervix and uterus surgically absent  Rectal:    Normal tone, normal prostate, no masses or tenderness;   guaiac negative stool  Extremities:   Extremities normal, atraumatic, no cyanosis or edema  Pulses:   2+ and symmetric all extremities  Skin:   Skin color, texture, turgor normal, no rashes or lesions            Assessment:    Healthy female exam.   Skin tag on right labia minora- no treatment needed   Plan:    Follow up in: 1 year.

## 2014-10-17 LAB — TSH: TSH: 0.619 u[IU]/mL (ref 0.350–4.500)

## 2014-10-17 LAB — CBC
HCT: 39.4 % (ref 36.0–46.0)
Hemoglobin: 13 g/dL (ref 12.0–15.0)
MCH: 29.3 pg (ref 26.0–34.0)
MCHC: 33 g/dL (ref 30.0–36.0)
MCV: 88.7 fL (ref 78.0–100.0)
MPV: 10.4 fL (ref 8.6–12.4)
Platelets: 209 10*3/uL (ref 150–400)
RBC: 4.44 MIL/uL (ref 3.87–5.11)
RDW: 13.3 % (ref 11.5–15.5)
WBC: 7 10*3/uL (ref 4.0–10.5)

## 2014-10-27 ENCOUNTER — Encounter: Payer: Self-pay | Admitting: *Deleted

## 2014-10-27 ENCOUNTER — Encounter: Payer: Self-pay | Admitting: Obstetrics and Gynecology

## 2014-10-27 ENCOUNTER — Ambulatory Visit (INDEPENDENT_AMBULATORY_CARE_PROVIDER_SITE_OTHER): Payer: 59 | Admitting: *Deleted

## 2014-10-27 DIAGNOSIS — G43001 Migraine without aura, not intractable, with status migrainosus: Secondary | ICD-10-CM

## 2014-10-27 MED ORDER — KETOROLAC TROMETHAMINE 60 MG/2ML IM SOLN
60.0000 mg | Freq: Once | INTRAMUSCULAR | Status: AC
  Administered 2014-10-27: 60 mg via INTRAMUSCULAR

## 2014-10-27 NOTE — Progress Notes (Signed)
Patient has been having a terrible migraine since Friday and would like a shot of ketorolac to see if that will help.

## 2014-11-26 LAB — HM DIABETES EYE EXAM

## 2014-11-27 ENCOUNTER — Encounter: Payer: Self-pay | Admitting: *Deleted

## 2014-12-09 ENCOUNTER — Ambulatory Visit: Payer: 59 | Admitting: Internal Medicine

## 2014-12-17 ENCOUNTER — Ambulatory Visit (INDEPENDENT_AMBULATORY_CARE_PROVIDER_SITE_OTHER): Payer: 59 | Admitting: *Deleted

## 2014-12-17 DIAGNOSIS — G43001 Migraine without aura, not intractable, with status migrainosus: Secondary | ICD-10-CM

## 2014-12-17 MED ORDER — KETOROLAC TROMETHAMINE 60 MG/2ML IM SOLN
60.0000 mg | Freq: Once | INTRAMUSCULAR | Status: AC
  Administered 2014-12-17: 60 mg via INTRAMUSCULAR

## 2014-12-17 NOTE — Progress Notes (Signed)
Patient has a severe migraine and came in today for a Toradol injection.

## 2014-12-18 ENCOUNTER — Emergency Department
Admission: EM | Admit: 2014-12-18 | Discharge: 2014-12-18 | Disposition: A | Discharge status: Home / Self Care | Payer: 59 | Attending: Emergency Medicine | Admitting: Emergency Medicine

## 2014-12-18 ENCOUNTER — Emergency Department: Payer: 59

## 2014-12-18 ENCOUNTER — Encounter: Payer: Self-pay | Admitting: Internal Medicine

## 2014-12-18 ENCOUNTER — Encounter: Payer: Self-pay | Admitting: Emergency Medicine

## 2014-12-18 DIAGNOSIS — Z79899 Other long term (current) drug therapy: Secondary | ICD-10-CM | POA: Insufficient documentation

## 2014-12-18 DIAGNOSIS — G43909 Migraine, unspecified, not intractable, without status migrainosus: Secondary | ICD-10-CM | POA: Insufficient documentation

## 2014-12-18 DIAGNOSIS — Z880 Allergy status to penicillin: Secondary | ICD-10-CM | POA: Diagnosis not present

## 2014-12-18 DIAGNOSIS — E119 Type 2 diabetes mellitus without complications: Secondary | ICD-10-CM | POA: Insufficient documentation

## 2014-12-18 DIAGNOSIS — Z91040 Latex allergy status: Secondary | ICD-10-CM | POA: Insufficient documentation

## 2014-12-18 DIAGNOSIS — Z791 Long term (current) use of non-steroidal anti-inflammatories (NSAID): Secondary | ICD-10-CM | POA: Diagnosis not present

## 2014-12-18 DIAGNOSIS — R51 Headache: Secondary | ICD-10-CM | POA: Diagnosis present

## 2014-12-18 MED ORDER — SODIUM CHLORIDE 0.9 % IV BOLUS (SEPSIS)
1000.0000 mL | Freq: Once | INTRAVENOUS | Status: AC
  Administered 2014-12-18: 1000 mL via INTRAVENOUS

## 2014-12-18 MED ORDER — KETOROLAC TROMETHAMINE 30 MG/ML IJ SOLN
30.0000 mg | Freq: Once | INTRAMUSCULAR | Status: AC
  Administered 2014-12-18: 30 mg via INTRAVENOUS
  Filled 2014-12-18: qty 1

## 2014-12-18 MED ORDER — METOCLOPRAMIDE HCL 5 MG/ML IJ SOLN
10.0000 mg | Freq: Once | INTRAMUSCULAR | Status: AC
  Administered 2014-12-18: 10 mg via INTRAVENOUS
  Filled 2014-12-18: qty 2

## 2014-12-18 MED ORDER — KETOROLAC TROMETHAMINE 10 MG PO TABS: 10.0000 mg | ORAL_TABLET | Freq: Four times a day (QID) | ORAL | Status: DC | PRN

## 2014-12-18 MED ORDER — DIPHENHYDRAMINE HCL 50 MG/ML IJ SOLN
12.5000 mg | Freq: Once | INTRAMUSCULAR | Status: AC
  Administered 2014-12-18: 12.5 mg via INTRAVENOUS
  Filled 2014-12-18: qty 1

## 2014-12-18 NOTE — Discharge Instructions (Signed)

## 2014-12-18 NOTE — ED Notes (Signed)
Pt comes into ED c/o migraine since Sunday.  States she has taken all of her Rx medications (percocet, metoprolol) and no relief has occurred.  Toradol injection was given yesterday.  Patient states "I see lightening bolts when my eyes close".

## 2014-12-18 NOTE — ED Notes (Signed)
States she developed a migraine on Sunday ..has taken her regular meds for headache .  was given toradol w/o relief..positive photsensitivity

## 2014-12-18 NOTE — ED Notes (Signed)
IV infiltrated after medications and 200cc fluids. Pa aware

## 2014-12-18 NOTE — ED Provider Notes (Signed)
[facility] Emergency Department Provider Note  ____________________________________________  Time seen: Approximately 12:44 PM  I have reviewed the triage vital signs and the nursing notes.   HISTORY  Chief Complaint Migraine  HPI CADIE SORCI is a 44 y.o. female who presents to the emergency department for evaluation of a migraine. She states that the migraine started on Sunday. She has taken all of her medications including her migraine prevention medication, Xanax, Flexeril,  Midrin, Percocet, Norflex, and Excedrin Migraine without relief. She went to her headache specialist yesterday and received a shot of toradol, which has not helped either.   Past Medical History  Diagnosis Date  . Diabetes mellitus 2011    diet controlled,  gestational  . Headache disorder   . Allergy   . Anemia     Patient Active Problem List   Diagnosis Date Noted  . Concussion with loss of consciousness 07/21/2014  . Hyperlipidemia associated with type 2 diabetes mellitus 06/13/2014  . Pain in joint, shoulder region 08/11/2013  . Vitamin D deficiency 05/07/2013  . S/P Total Abdominal Hysterectomy and Left Salpingo-oophorectomy 10/20/2011  . Headache disorder   . Headache syndrome, complicated 57/32/2025  . Diabetes mellitus type 2, controlled 07/20/2011  . Obesity (BMI 30-39.9) 07/20/2011  . Hypertension goal BP (blood pressure) < 130/80 07/20/2011    Past Surgical History  Procedure Laterality Date  . Hernia repair  2003    left inguinal   . Breast surgery      right breast x 2 , benign  . Tubal ligation    . Abdominal hysterectomy  2006    heavy menses, endometriosis, l oophrectomy  . Left oophorectomy      Current Outpatient Rx  Name  Route  Sig  Dispense  Refill  . albuterol (PROVENTIL HFA;VENTOLIN HFA) 108 (90 BASE) MCG/ACT inhaler      One puff as needed         . ALPRAZolam (XANAX) 0.5 MG tablet   Oral   Take 1 tablet (0.5 mg total) by mouth  daily as needed for sleep. Take one by mouth as needed   30 tablet   1   . cyclobenzaprine (FLEXERIL) 10 MG tablet   Oral   Take 0.5 tablets (5 mg total) by mouth 3 (three) times daily as needed for muscle spasms. Patient not taking: Reported on 10/16/2014   30 tablet   0   . isometheptene-acetaminophen-dichloralphenazone (MIDRIN) 65-325-100 MG capsule   Oral   Take 1 capsule by mouth 4 (four) times daily as needed.   60 capsule   3   . ketorolac (TORADOL) 10 MG tablet   Oral   Take 1 tablet (10 mg total) by mouth every 6 (six) hours as needed.   20 tablet   0   . lisinopril (PRINIVIL,ZESTRIL) 10 MG tablet   Oral   Take 1 tablet (10 mg total) by mouth daily.   90 tablet   3   . meclizine (ANTIVERT) 25 MG tablet   Oral   Take 25 mg by mouth 3 (three) times daily as needed for dizziness.         . meloxicam (MOBIC) 15 MG tablet   Oral   Take 1 tablet (15 mg total) by mouth daily.   30 tablet   4   . metoprolol succinate (TOPROL-XL) 50 MG 24 hr tablet   Oral   Take 1 tablet (50 mg total) by mouth daily. Take with or immediately following  a meal.   90 tablet   1   . ondansetron (ZOFRAN-ODT) 4 MG disintegrating tablet   Oral   Take 4 mg by mouth every 8 (eight) hours as needed.         . orphenadrine (NORFLEX) 100 MG tablet   Oral   Take 1 tablet (100 mg total) by mouth 2 (two) times daily as needed.   60 tablet   1   . oxyCODONE-acetaminophen (PERCOCET/ROXICET) 5-325 MG per tablet   Oral   Take 1 tablet by mouth every 6 (six) hours as needed. Take one by mouth as needed   30 tablet   0   . traMADol (ULTRAM) 50 MG tablet   Oral   Take 1 tablet (50 mg total) by mouth every 8 (eight) hours as needed.   30 tablet   3   . zonisamide (ZONEGRAN) 100 MG capsule   Oral   Take 1 capsule (100 mg total) by mouth 4 (four) times daily.   120 capsule   6     Allergies Botox; Maxalt; Nitrous oxide; Aspirin; Latex; Vicodin; Penicillins; and Tetracyclines &  related  Family History  Problem Relation Age of Onset  . Diabetes Mother   . Heart disease Mother   . Hypertension Mother   . Hyperlipidemia Mother   . Cancer Father 75    Lung Cancer  . Mental illness Sister     bipolar, substance abuse,  clean 2 yrs  . Hypertension Sister   . Diabetes Son   . Cancer Paternal Grandmother 62    breast cancer    Social History History  Substance Use Topics  . Smoking status: Never Smoker   . Smokeless tobacco: Never Used  . Alcohol Use: No    Review of Systems Constitutional: No fever/chills, no confusion or change in mentation. Eyes: No visual changes. "Lightning flashes in right eye" when she closes her eyes. Photophobia.  ENT: No sore throat. Cardiovascular: Denies chest pain. Respiratory: Denies shortness of breath. Gastrointestinal: No abdominal pain. Positive for nausea, no vomiting. Genitourinary: Negative for dysuria. Musculoskeletal: Negative for back pain. Skin: Negative for rash. Neurological:Headache behind right eye  10-point ROS otherwise negative.  ____________________________________________   PHYSICAL EXAM:  VITAL SIGNS: ED Triage Vitals  Enc Vitals Group     BP 12/18/14 1225 164/102 mmHg     Pulse Rate 12/18/14 1225 76     Resp 12/18/14 1225 16     Temp 12/18/14 1225 97.8 F (36.6 C)     Temp Source 12/18/14 1225 Oral     SpO2 12/18/14 1225 95 %     Weight 12/18/14 1225 214 lb (97.07 kg)     Height 12/18/14 1225 5\' 6"  (1.676 m)     Head Cir --      Peak Flow --      Pain Score 12/18/14 1225 8     Pain Loc --      Pain Edu? --      Excl. in [place]? --     Constitutional: Alert and oriented. Well appearing and in no acute distress. Eyes: Conjunctivae are normal. PERRL. EOMI.  Head: Atraumatic. Nose: No congestion/rhinnorhea. Mouth/Throat: Mucous membranes are moist.  Oropharynx non-erythematous. Neck: No stridor.   Cardiovascular: Normal rate, regular rhythm. Grossly normal heart sounds.  Good  peripheral circulation. Respiratory: Normal respiratory effort.  No retractions. Lungs CTAB. Gastrointestinal: Soft and nontender. No distention. No abdominal bruits. No CVA tenderness. Musculoskeletal: No lower extremity tenderness nor edema.  No joint effusions. Neurologic:  Normal speech and language. No gross focal neurologic deficits are appreciated. No gait instability. Skin:  Skin is warm, dry and intact. No rash noted. Psychiatric: Mood and affect are normal. Speech and behavior are normal.  ____________________________________________   LABS (all labs ordered are listed, but only abnormal results are displayed)  Labs Reviewed - No data to display ____________________________________________  EKG   ____________________________________________  RADIOLOGY  CT negative for acute abnormality. ____________________________________________   PROCEDURES  Procedure(s) performed: None  Critical Care performed: No  ____________________________________________   INITIAL IMPRESSION / ASSESSMENT AND PLAN / ED COURSE  Pertinent labs & imaging results that were available during my care of the patient were reviewed by me and considered in my medical decision making (see chart for details).  Patient reports she has not had imaging for several years. Will do CT today due to atypical pain and visual aura.  3:08 PM  Patient awake, sitting on the stretcher with the lights on eating a full meal. She reports that her headache is nearly resolved. We'll discharge home with by mouth ketorolac and have her follow-up with her headache specialist. ____________________________________________   FINAL CLINICAL IMPRESSION(S) / ED DIAGNOSES  Final diagnoses:  Migraine without status migrainosus, not intractable, unspecified migraine type      Victorino Dike, FNP 12/18/14 1519  Lavonia Drafts, MD 12/18/14 1534

## 2014-12-23 ENCOUNTER — Ambulatory Visit (INDEPENDENT_AMBULATORY_CARE_PROVIDER_SITE_OTHER): Payer: 59 | Admitting: Physician Assistant

## 2014-12-23 VITALS — BP 147/88 | HR 64 | Ht 66.0 in | Wt 213.0 lb

## 2014-12-23 DIAGNOSIS — G43701 Chronic migraine without aura, not intractable, with status migrainosus: Secondary | ICD-10-CM | POA: Diagnosis not present

## 2014-12-23 MED ORDER — ZONISAMIDE 100 MG PO CAPS: 500.0000 mg | ORAL_CAPSULE | Freq: Every day | ORAL | Status: DC

## 2014-12-23 NOTE — Patient Instructions (Signed)

## 2014-12-23 NOTE — Progress Notes (Signed)
Patient ID: [name], female   DOB: 1970/07/03, 44 y.o.   MRN: [id number] History:  [name] is a 44 y.o. [id number] who presents to clinic today for eval of migraines.  She is new to this provider.  She had a HA last week that required ER attention.  She presently has a mild-mod HA that has been ongoing for 3 days so far.  She used midrin this morning.  Midrin minimally helpful.  HA is usually top middle or right retroorbital and gets to be severe.  Never pulsating but she can hear her pulse when she has the HA.  Described as a pressure or sometimes sharp.  It is worse with movement.  Associated with photophobia and phonophobia as well as nausea and vomiting.   She has never had vision changes with headaches previously until last week.  Last week she saw red lightning, right side only = started after the HA was already severe.   Has had HA since age 28.  Has tried Topamax, Amitriptyline, Effexor, Neurontin, Keppra, Nortriptyline, Prozac all with problems.  Previously had serious reaction to Maxalt and therefore considers all Triptans as potentially dangerous.    Acute meds presently used while at work include Midrin, Mobic, Tramadol, Norflex, Xanax. When at home, she also takes Midrin, Mobic, tramadol, Percocet.   Uses Tramadol and Mobic most often.   She is presently using Zonegran 400mg  daily, Metoprolol 50mg  for HA prevention (and comorbid HTN). Last visit 6/15 - got Botox.  Immediately following injection procedure - felt SOB, dizzy, extremely weak, syncopal.  Pt feels strongly this was a life-threatening reaction to Botox.   It was very helpful for migraine prevention - but not worth the risk of another reaction.    Steroid taper was helpful previously for HA.  Also Trigger Point Injections have been helpful.    HIT6:66 Number of days in the last 4 weeks with:  Severe headache: 10 Moderate headache: 4 Mild headache: 8  No headache: 6   Past Medical History  Diagnosis Date  .  Diabetes mellitus 2011    diet controlled,  gestational  . Headache disorder   . Allergy   . Anemia     History   Social History  . Marital Status: Married    Spouse Name: N/A  . Number of Children: N/A  . Years of Education: N/A   Occupational History  . Not on file.   Social History Main Topics  . Smoking status: Never Smoker   . Smokeless tobacco: Never Used  . Alcohol Use: No  . Drug Use: No  . Sexual Activity:    Partners: Male    Birth Control/ Protection: Surgical   Other Topics Concern  . Not on file   Social History Narrative    Family History  Problem Relation Age of Onset  . Diabetes Mother   . Heart disease Mother   . Hypertension Mother   . Hyperlipidemia Mother   . Cancer Father 52    Lung Cancer  . Mental illness Sister     bipolar, substance abuse,  clean 2 yrs  . Hypertension Sister   . Diabetes Son   . Cancer Paternal Grandmother 24    breast cancer    Allergies  Allergen Reactions  . Botox [Onabotulinumtoxina] Shortness Of Breath    syncope  . Maxalt [Rizatriptan Benzoate] Anaphylaxis    Chest pain  . Nitrous Oxide Shortness Of Breath    syncope  . Aspirin  Mouth blisters  . Latex     Sometimes causes rash  . Vicodin [Hydrocodone-Acetaminophen]     hallucinations  . Penicillins Rash  . Tetracyclines & Related Rash    Current Outpatient Prescriptions on File Prior to Visit  Medication Sig Dispense Refill  . albuterol (PROVENTIL HFA;VENTOLIN HFA) 108 (90 BASE) MCG/ACT inhaler One puff as needed    . ALPRAZolam (XANAX) 0.5 MG tablet Take 1 tablet (0.5 mg total) by mouth daily as needed for sleep. Take one by mouth as needed 30 tablet 1  . isometheptene-acetaminophen-dichloralphenazone (MIDRIN) 65-325-100 MG capsule Take 1 capsule by mouth 4 (four) times daily as needed. 60 capsule 3  . lisinopril (PRINIVIL,ZESTRIL) 10 MG tablet Take 1 tablet (10 mg total) by mouth daily. 90 tablet 3  . meclizine (ANTIVERT) 25 MG tablet Take  25 mg by mouth 3 (three) times daily as needed for dizziness.    . meloxicam (MOBIC) 15 MG tablet Take 1 tablet (15 mg total) by mouth daily. 30 tablet 4  . metoprolol succinate (TOPROL-XL) 50 MG 24 hr tablet Take 1 tablet (50 mg total) by mouth daily. Take with or immediately following a meal. 90 tablet 1  . ondansetron (ZOFRAN-ODT) 4 MG disintegrating tablet Take 4 mg by mouth every 8 (eight) hours as needed.    . orphenadrine (NORFLEX) 100 MG tablet Take 1 tablet (100 mg total) by mouth 2 (two) times daily as needed. 60 tablet 1  . oxyCODONE-acetaminophen (PERCOCET/ROXICET) 5-325 MG per tablet Take 1 tablet by mouth every 6 (six) hours as needed. Take one by mouth as needed 30 tablet 0  . traMADol (ULTRAM) 50 MG tablet Take 1 tablet (50 mg total) by mouth every 8 (eight) hours as needed. 30 tablet 3  . zonisamide (ZONEGRAN) 100 MG capsule Take 1 capsule (100 mg total) by mouth 4 (four) times daily. 120 capsule 6  . ketorolac (TORADOL) 10 MG tablet Take 1 tablet (10 mg total) by mouth every 6 (six) hours as needed. (Patient not taking: Reported on 12/23/2014) 20 tablet 0   No current facility-administered medications on file prior to visit.     Review of Systems:  All pertinent positive/negative included in HPI, all other review of systems are negative  Objective:  Physical Exam BP 147/88 mmHg  Pulse 64  Ht 5\' 6"  (1.676 m)  Wt 213 lb (96.616 kg)  BMI 34.40 kg/m2 CONSTITUTIONAL: Well-developed, well-nourished female in no acute distress.  EYES: EOM intact ENT: Normocephalic CARDIOVASCULAR: Regular rate and rhythm with no adventitious sounds.  RESPIRATORY: Normal rate. Clear to auscultation bilaterally.  ENDOCRINE: Normal thyroid.  MUSCULOSKELETAL: Normal ROM, strength equal bilaterally SKIN: Warm, dry without erythema  NEUROLOGICAL: Alert, oriented, CN II-XII grossly intact, Appropriate balance, No dysmetria, Sensation equal bilaterally, Romberg negative.   PSYCH: Normal behavior,  mood   Assessment & Plan:  Assessment: Chronic migraine  Plan: Zonegran increased to 500mg  qd for HA prevention Pt reports no other meds needed as MD has been re-filling.  May consider TPI or steroid taper should HA worsen/be intractable.    Follow-up in 2 months or sooner PRN  Paticia Stack, PA-C 12/23/2014 1:41 PM

## 2014-12-26 LAB — HM DIABETES EYE EXAM

## 2015-01-19 ENCOUNTER — Ambulatory Visit (INDEPENDENT_AMBULATORY_CARE_PROVIDER_SITE_OTHER): Payer: 59 | Admitting: Internal Medicine

## 2015-01-19 ENCOUNTER — Encounter: Payer: Self-pay | Admitting: Internal Medicine

## 2015-01-19 VITALS — BP 124/90 | HR 64 | Temp 98.3°F | Resp 14 | Ht 66.0 in | Wt 214.0 lb

## 2015-01-19 DIAGNOSIS — E119 Type 2 diabetes mellitus without complications: Secondary | ICD-10-CM | POA: Diagnosis not present

## 2015-01-19 DIAGNOSIS — I10 Essential (primary) hypertension: Secondary | ICD-10-CM

## 2015-01-19 DIAGNOSIS — E785 Hyperlipidemia, unspecified: Secondary | ICD-10-CM

## 2015-01-19 DIAGNOSIS — E1169 Type 2 diabetes mellitus with other specified complication: Secondary | ICD-10-CM | POA: Diagnosis not present

## 2015-01-19 DIAGNOSIS — E669 Obesity, unspecified: Secondary | ICD-10-CM | POA: Diagnosis not present

## 2015-01-19 DIAGNOSIS — E1159 Type 2 diabetes mellitus with other circulatory complications: Secondary | ICD-10-CM

## 2015-01-19 DIAGNOSIS — R51 Headache: Secondary | ICD-10-CM

## 2015-01-19 DIAGNOSIS — I152 Hypertension secondary to endocrine disorders: Secondary | ICD-10-CM

## 2015-01-19 DIAGNOSIS — R519 Headache, unspecified: Secondary | ICD-10-CM

## 2015-01-19 LAB — COMPREHENSIVE METABOLIC PANEL
ALT: 10 U/L (ref 0–35)
AST: 14 U/L (ref 0–37)
Albumin: 4.1 g/dL (ref 3.5–5.2)
Alkaline Phosphatase: 88 U/L (ref 39–117)
BUN: 11 mg/dL (ref 6–23)
CO2: 23 mEq/L (ref 19–32)
Calcium: 9 mg/dL (ref 8.4–10.5)
Chloride: 108 mEq/L (ref 96–112)
Creatinine, Ser: 0.96 mg/dL (ref 0.40–1.20)
GFR: 80.65 mL/min (ref >60.00)
Glucose, Bld: 163 mg/dL — ABNORMAL HIGH (ref 70–99)
Potassium: 3.5 mEq/L (ref 3.5–5.1)
Sodium: 140 mEq/L (ref 135–145)
Total Bilirubin: 0.4 mg/dL (ref 0.2–1.2)
Total Protein: 7.2 g/dL (ref 6.0–8.3)

## 2015-01-19 LAB — LIPID PANEL
Cholesterol: 192 mg/dL (ref 0–200)
HDL: 35.4 mg/dL — ABNORMAL LOW (ref >39.00)
LDL Cholesterol: 131 mg/dL — ABNORMAL HIGH (ref 0–99)
NonHDL: 156.11
Total CHOL/HDL Ratio: 5
Triglycerides: 128 mg/dL (ref 0.0–149.0)
VLDL: 25.6 mg/dL (ref 0.0–40.0)

## 2015-01-19 LAB — LDL CHOLESTEROL, DIRECT: Direct LDL: 132 mg/dL

## 2015-01-19 LAB — HEMOGLOBIN A1C: Hgb A1c MFr Bld: 6.7 % — ABNORMAL HIGH (ref 4.6–6.5)

## 2015-01-19 LAB — MICROALBUMIN / CREATININE URINE RATIO
Creatinine,U: 225 mg/dL
Microalb Creat Ratio: 1 mg/g (ref 0.0–30.0)
Microalb, Ur: 2.3 mg/dL — ABNORMAL HIGH (ref 0.0–1.9)

## 2015-01-19 MED ORDER — LISINOPRIL 10 MG PO TABS: 20.0000 mg | ORAL_TABLET | Freq: Every day | ORAL | Status: DC

## 2015-01-19 MED ORDER — ALBUTEROL SULFATE HFA 108 (90 BASE) MCG/ACT IN AERS: 2.0000 | INHALATION_SPRAY | Freq: Four times a day (QID) | RESPIRATORY_TRACT | Status: DC | PRN

## 2015-01-19 MED ORDER — DIAZEPAM 10 MG PO TABS: 10.0000 mg | ORAL_TABLET | Freq: Two times a day (BID) | ORAL | Status: DC | PRN

## 2015-01-19 NOTE — Assessment & Plan Note (Signed)
New ACC guidelines recommend starting patients aged 45 or higher on moderate intensity statin therapy for diabetes and concurrent LDL between 70-189.Again recommended trial of simvastatin.   Lab Results  Component Value Date   CHOL 192 01/19/2015   HDL 35.40* 01/19/2015   LDLCALC 131* 01/19/2015   LDLDIRECT 132.0 01/19/2015   TRIG 128.0 01/19/2015   CHOLHDL 5 01/19/2015

## 2015-01-19 NOTE — Progress Notes (Signed)
Subjective:  Patient ID: [name], female    DOB: 1970/09/02  Age: 44 y.o. MRN: [id number]  CC: The primary encounter diagnosis was Obesity, diabetes, and hypertension syndrome. Diagnoses of Hyperlipidemia associated with type 2 diabetes mellitus, Headache disorder, Diabetes mellitus type 2, controlled, and Hypertension goal BP (blood pressure) < 130/80 were also pertinent to this visit.  HPI ZAMERIA VOGL presents for follow up on diet controlled DM.     She had an ER  visit last month for severe headachelasting 4 days that did not respond to toradol injection given by  Headache specialist. ( Sees headache specialist this wee,) The headache was  Acc by visual changes and flashes of light.  CT head was done, results normal .  Was treated for migraine and previously noted hypertension improved .   She has a historty of complicated headaches, with prior treatment strategies including use of Botox injections followed by trial of triptans which caused chest pain.  She had another bad one this weekend ,  Averages 4 per week  Never completely resolves. goes away,  Several bad ones per month  NOT [place] UP TO DATE   Type 2 DM:  Working 2 jobs, not exercising  8 am to 10 pm 5 days per week  Outpatient Prescriptions Prior to Visit  Medication Sig Dispense Refill  . ALPRAZolam (XANAX) 0.5 MG tablet Take 1 tablet (0.5 mg total) by mouth daily as needed for sleep. Take one by mouth as needed 30 tablet 1  . isometheptene-acetaminophen-dichloralphenazone (MIDRIN) 65-325-100 MG capsule Take 1 capsule by mouth 4 (four) times daily as needed. 60 capsule 3  . ketorolac (TORADOL) 10 MG tablet Take 1 tablet (10 mg total) by mouth every 6 (six) hours as needed. 20 tablet 0  . meclizine (ANTIVERT) 25 MG tablet Take 25 mg by mouth 3 (three) times daily as needed for dizziness.    . meloxicam (MOBIC) 15 MG tablet Take 1 tablet (15 mg total) by mouth daily. 30 tablet  4  . metoprolol succinate (TOPROL-XL) 50 MG 24 hr tablet Take 1 tablet (50 mg total) by mouth daily. Take with or immediately following a meal. 90 tablet 1  . ondansetron (ZOFRAN-ODT) 4 MG disintegrating tablet Take 4 mg by mouth every 8 (eight) hours as needed.    . orphenadrine (NORFLEX) 100 MG tablet Take 1 tablet (100 mg total) by mouth 2 (two) times daily as needed. 60 tablet 1  . oxyCODONE-acetaminophen (PERCOCET/ROXICET) 5-325 MG per tablet Take 1 tablet by mouth every 6 (six) hours as needed. Take one by mouth as needed 30 tablet 0  . traMADol (ULTRAM) 50 MG tablet Take 1 tablet (50 mg total) by mouth every 8 (eight) hours as needed. 30 tablet 3  . zonisamide (ZONEGRAN) 100 MG capsule Take 5 capsules (500 mg total) by mouth daily. 150 capsule 2  . albuterol (PROVENTIL HFA;VENTOLIN HFA) 108 (90 BASE) MCG/ACT inhaler One puff as needed    . lisinopril (PRINIVIL,ZESTRIL) 10 MG tablet Take 1 tablet (10 mg total) by mouth daily. 90 tablet 3   No facility-administered medications prior to visit.    Review of Systems;  Patient denies headache, fevers, malaise, unintentional weight loss, skin rash, eye pain, sinus congestion and sinus pain, sore throat, dysphagia,  hemoptysis , cough, dyspnea, wheezing, chest pain, palpitations, orthopnea, edema, abdominal pain, nausea, melena, diarrhea, constipation, flank pain, dysuria, hematuria, urinary  Frequency, nocturia, numbness, tingling, seizures,  Focal weakness, Loss of consciousness,  Tremor, insomnia, depression, anxiety, and suicidal ideation.      Objective:  BP 124/90 mmHg  Pulse 64  Temp(Src) 98.3 F (36.8 C) (Oral)  Resp 14  Ht 5\' 6"  (1.676 m)  Wt 214 lb (97.07 kg)  BMI 34.56 kg/m2  SpO2 98%  BP Readings from Last 3 Encounters:  01/19/15 124/90  12/23/14 147/88  12/18/14 125/76    Wt Readings from Last 3 Encounters:  01/19/15 214 lb (97.07 kg)  12/23/14 213 lb (96.616 kg)  12/18/14 214 lb (97.07 kg)    General appearance:  alert, cooperative and appears stated age Ears: normal TM's and external ear canals both ears Throat: lips, mucosa, and tongue normal; teeth and gums normal Neck: no adenopathy, no carotid bruit, supple, symmetrical, trachea midline and thyroid not enlarged, symmetric, no tenderness/mass/nodules Back: symmetric, no curvature. ROM normal. No CVA tenderness. Lungs: clear to auscultation bilaterally Heart: regular rate and rhythm, S1, S2 normal, no murmur, click, rub or gallop Abdomen: soft, non-tender; bowel sounds normal; no masses,  no organomegaly Pulses: 2+ and symmetric Skin: Skin color, texture, turgor normal. No rashes or lesions Lymph nodes: Cervical, supraclavicular, and axillary nodes normal.  Lab Results  Component Value Date   HGBA1C 6.7* 01/19/2015   HGBA1C 6.9* 06/10/2014   HGBA1C 6.1 08/09/2013    Lab Results  Component Value Date   CREATININE 0.96 01/19/2015   CREATININE 1.0 06/10/2014   CREATININE 1.0 08/09/2013    Lab Results  Component Value Date   WBC 7.0 10/16/2014   HGB 13.0 10/16/2014   HCT 39.4 10/16/2014   PLT 209 10/16/2014   GLUCOSE 163* 01/19/2015   CHOL 192 01/19/2015   TRIG 128.0 01/19/2015   HDL 35.40* 01/19/2015   LDLDIRECT 132.0 01/19/2015   LDLCALC 131* 01/19/2015   ALT 10 01/19/2015   AST 14 01/19/2015   NA 140 01/19/2015   K 3.5 01/19/2015   CL 108 01/19/2015   CREATININE 0.96 01/19/2015   BUN 11 01/19/2015   CO2 23 01/19/2015   TSH 0.619 10/16/2014   HGBA1C 6.7* 01/19/2015   MICROALBUR 2.3* 01/19/2015    Ct Head Wo Contrast  12/18/2014   CLINICAL DATA:  Atypical migraine.  EXAM: CT HEAD WITHOUT CONTRAST  TECHNIQUE: Contiguous axial images were obtained from the base of the skull through the vertex without intravenous contrast.  COMPARISON:  02/16/2010  FINDINGS: No mass lesion. No midline shift. No acute hemorrhage or hematoma. No extra-axial fluid collections. No evidence of acute infarction. Normal brain parenchyma. Normal  osseous structures.  IMPRESSION: Normal exam.   Electronically Signed   By: Lorriane Shire M.D.   On: 12/18/2014 13:44    Assessment & Plan:   Problem List Items Addressed This Visit      Unprioritized   Diabetes mellitus type 2, controlled    Well-controlled on diet alone .  hemoglobin A1c has been consistently less than 7.0 . Since diagnosis.   Lab Results  Component Value Date   HGBA1C 6.7* 01/19/2015   Lab Results  Component Value Date   MICROALBUR 2.3* 01/19/2015    Patient is up-to-date on eye exams and foot exam was done today.  There is  no proteinuria on today's micro urinalysis .             Relevant Medications   lisinopril (PRINIVIL,ZESTRIL) 10 MG tablet   Hypertension goal BP (blood pressure) < 130/80    Lisinopril dose increased,  And adivsed to  take metoprool a night       Relevant Medications   lisinopril (PRINIVIL,ZESTRIL) 10 MG tablet   Headache disorder    With recent head CT done in Er due to persistent headache with visual changes. NOrmal       Hyperlipidemia associated with type 2 diabetes mellitus    New ACC guidelines recommend starting patients aged 77 or higher on moderate intensity statin therapy for diabetes and concurrent LDL between 70-189.Again recommended trial of simvastatin.   Lab Results  Component Value Date   CHOL 192 01/19/2015   HDL 35.40* 01/19/2015   LDLCALC 131* 01/19/2015   LDLDIRECT 132.0 01/19/2015   TRIG 128.0 01/19/2015   CHOLHDL 5 01/19/2015           Relevant Medications   lisinopril (PRINIVIL,ZESTRIL) 10 MG tablet    Other Visit Diagnoses    Obesity, diabetes, and hypertension syndrome    -  Primary    Relevant Medications    lisinopril (PRINIVIL,ZESTRIL) 10 MG tablet    Other Relevant Orders    Comprehensive metabolic panel (Completed)    Hemoglobin A1c (Completed)    LDL cholesterol, direct (Completed)    Lipid panel (Completed)    Microalbumin / creatinine urine ratio (Completed)       I have  changed Ms. Coad's albuterol and lisinopril. I am also having her start on diazepam. Additionally, I am having her maintain her ondansetron, meclizine, isometheptene-acetaminophen-dichloralphenazone, orphenadrine, ALPRAZolam, oxyCODONE-acetaminophen, traMADol, meloxicam, metoprolol succinate, ketorolac, and zonisamide.  Meds ordered this encounter  Medications  . albuterol (PROVENTIL HFA;VENTOLIN HFA) 108 (90 BASE) MCG/ACT inhaler    Sig: Inhale 2 puffs into the lungs every 6 (six) hours as needed for wheezing or shortness of breath. One puff as needed    Dispense:  3.7 g    Refill:  1  . diazepam (VALIUM) 10 MG tablet    Sig: Take 1 tablet (10 mg total) by mouth every 12 (twelve) hours as needed (muscle spasm).    Dispense:  30 tablet    Refill:  1  . lisinopril (PRINIVIL,ZESTRIL) 10 MG tablet    Sig: Take 2 tablets (20 mg total) by mouth daily.    Dispense:  90 tablet    Refill:  3    Medications Discontinued During This Encounter  Medication Reason  . albuterol (PROVENTIL HFA;VENTOLIN HFA) 108 (90 BASE) MCG/ACT inhaler Reorder  . lisinopril (PRINIVIL,ZESTRIL) 10 MG tablet Reorder  A total of 25 minutes of face to face time was spent with patient more than half of which was spent in counselling about the above mentioned conditions  and coordination of care   Follow-up: Return for follow up diabetes.   Crecencio Mc, MD

## 2015-01-19 NOTE — Assessment & Plan Note (Signed)
With recent head CT done in Er due to persistent headache with visual changes. NOrmal

## 2015-01-19 NOTE — Patient Instructions (Signed)
I am changing lisinopril to 20 mg daily  You may want to resume metoprolol at night   TRIAL OF VALIUM  for tension related headaches instead of alprazolam

## 2015-01-19 NOTE — Progress Notes (Signed)
Pre-visit discussion using our clinic review tool. No additional management support is needed unless otherwise documented below in the visit note.  

## 2015-01-19 NOTE — Assessment & Plan Note (Signed)
Lisinopril dose increased,  And adivsed to take metoprool a night

## 2015-01-19 NOTE — Assessment & Plan Note (Signed)
Well-controlled on diet alone .  hemoglobin A1c has been consistently less than 7.0 . Since diagnosis.   Lab Results  Component Value Date   HGBA1C 6.7* 01/19/2015   Lab Results  Component Value Date   MICROALBUR 2.3* 01/19/2015    Patient is up-to-date on eye exams and foot exam was done today.  There is  no proteinuria on today's micro urinalysis .

## 2015-01-20 ENCOUNTER — Encounter: Payer: Self-pay | Admitting: Internal Medicine

## 2015-02-24 ENCOUNTER — Ambulatory Visit (INDEPENDENT_AMBULATORY_CARE_PROVIDER_SITE_OTHER): Payer: 59 | Admitting: Physician Assistant

## 2015-02-24 ENCOUNTER — Encounter: Payer: Self-pay | Admitting: Physician Assistant

## 2015-02-24 VITALS — BP 122/83 | HR 69 | Wt 213.0 lb

## 2015-02-24 DIAGNOSIS — G43009 Migraine without aura, not intractable, without status migrainosus: Secondary | ICD-10-CM

## 2015-02-24 DIAGNOSIS — R209 Unspecified disturbances of skin sensation: Secondary | ICD-10-CM

## 2015-02-24 DIAGNOSIS — M62838 Other muscle spasm: Secondary | ICD-10-CM | POA: Insufficient documentation

## 2015-02-24 DIAGNOSIS — R202 Paresthesia of skin: Secondary | ICD-10-CM

## 2015-02-24 DIAGNOSIS — G43709 Chronic migraine without aura, not intractable, without status migrainosus: Secondary | ICD-10-CM | POA: Insufficient documentation

## 2015-02-24 MED ORDER — ZONISAMIDE 100 MG PO CAPS: 600.0000 mg | ORAL_CAPSULE | Freq: Every day | ORAL | Status: DC

## 2015-02-24 NOTE — Patient Instructions (Signed)

## 2015-02-24 NOTE — Progress Notes (Signed)
Patient ID: [name], female   DOB: 1971/02/06, 44 y.o.   MRN: [id number] History:  [name] is a 44 y.o. [id number] who presents to clinic today for follow up of migraine headaches.  She increased zonegran to 500mg  last visit 2 months ago.  She believes the headaches to be just as frequent but less severe.  She believes she is getting facial numbness and tingling as a side effect of the medication but she states it is manageable.  It is around her lips bilat and face on both sides but L>R.   She cannot identify triggers of migraine but it sometimes comes on hard and fast.  Smells cause significant migraine.   Pain is up to severe.  Lasts >24 hours.  Worse with movement.  Pulsatile.    HIT6: 63 Number of days in the last 4 weeks with:  Severe headache: 3 Moderate headache: 8 Mild headache: 13  No headache: 4   Past Medical History  Diagnosis Date  . Diabetes mellitus 2011    diet controlled,  gestational  . Headache disorder   . Allergy   . Anemia     Social History   Social History  . Marital Status: Married    Spouse Name: N/A  . Number of Children: N/A  . Years of Education: N/A   Occupational History  . Not on file.   Social History Main Topics  . Smoking status: Never Smoker   . Smokeless tobacco: Never Used  . Alcohol Use: No  . Drug Use: No  . Sexual Activity:    Partners: Male    Birth Control/ Protection: Surgical   Other Topics Concern  . Not on file   Social History Narrative    Family History  Problem Relation Age of Onset  . Diabetes Mother   . Heart disease Mother   . Hypertension Mother   . Hyperlipidemia Mother   . Cancer Father 63    Lung Cancer  . Mental illness Sister     bipolar, substance abuse,  clean 2 yrs  . Hypertension Sister   . Diabetes Son   . Cancer Paternal Grandmother 57    breast cancer    Allergies  Allergen Reactions  . Botox [Onabotulinumtoxina] Shortness Of Breath    syncope  . Maxalt [Rizatriptan  Benzoate] Anaphylaxis    Chest pain  . Nitrous Oxide Shortness Of Breath    syncope  . Triptans Shortness Of Breath  . Aspirin     Mouth blisters  . Latex     Sometimes causes rash  . Vicodin [Hydrocodone-Acetaminophen]     hallucinations  . Penicillins Rash  . Tetracyclines & Related Rash    Current Outpatient Prescriptions on File Prior to Visit  Medication Sig Dispense Refill  . albuterol (PROVENTIL HFA;VENTOLIN HFA) 108 (90 BASE) MCG/ACT inhaler Inhale 2 puffs into the lungs every 6 (six) hours as needed for wheezing or shortness of breath. One puff as needed 3.7 g 1  . ALPRAZolam (XANAX) 0.5 MG tablet Take 1 tablet (0.5 mg total) by mouth daily as needed for sleep. Take one by mouth as needed 30 tablet 1  . diazepam (VALIUM) 10 MG tablet Take 1 tablet (10 mg total) by mouth every 12 (twelve) hours as needed (muscle spasm). 30 tablet 1  . isometheptene-acetaminophen-dichloralphenazone (MIDRIN) 65-325-100 MG capsule Take 1 capsule by mouth 4 (four) times daily as needed. 60 capsule 3  . ketorolac (TORADOL) 10 MG tablet Take 1 tablet (  10 mg total) by mouth every 6 (six) hours as needed. 20 tablet 0  . lisinopril (PRINIVIL,ZESTRIL) 10 MG tablet Take 2 tablets (20 mg total) by mouth daily. 90 tablet 3  . meclizine (ANTIVERT) 25 MG tablet Take 25 mg by mouth 3 (three) times daily as needed for dizziness.    . meloxicam (MOBIC) 15 MG tablet Take 1 tablet (15 mg total) by mouth daily. 30 tablet 4  . metoprolol succinate (TOPROL-XL) 50 MG 24 hr tablet Take 1 tablet (50 mg total) by mouth daily. Take with or immediately following a meal. 90 tablet 1  . ondansetron (ZOFRAN-ODT) 4 MG disintegrating tablet Take 4 mg by mouth every 8 (eight) hours as needed.    . orphenadrine (NORFLEX) 100 MG tablet Take 1 tablet (100 mg total) by mouth 2 (two) times daily as needed. 60 tablet 1  . oxyCODONE-acetaminophen (PERCOCET/ROXICET) 5-325 MG per tablet Take 1 tablet by mouth every 6 (six) hours as needed.  Take one by mouth as needed 30 tablet 0  . traMADol (ULTRAM) 50 MG tablet Take 1 tablet (50 mg total) by mouth every 8 (eight) hours as needed. 30 tablet 3  . zonisamide (ZONEGRAN) 100 MG capsule Take 5 capsules (500 mg total) by mouth daily. 150 capsule 2   No current facility-administered medications on file prior to visit.     Review of Systems:  All pertinent positive/negative included in HPI, all other review of systems are negative  Objective:  Physical Exam BP 122/83 mmHg  Pulse 69  Wt 213 lb (96.616 kg) Resp: 20 CONSTITUTIONAL: Well-developed, well-nourished female in no acute distress.  EYES: EOM intact ENT: Normocephalic CARDIOVASCULAR: Regular rate and rhythm with no adventitious sounds.  No carotid bruit RESPIRATORY: Normal rate. Clear to auscultation bilaterally.  MUSCULOSKELETAL: Normal ROM, strength equal bilaterally, significant muscle spasm noted in bilat paraspinal muscles of neck as well as trapezius SKIN: Warm, dry without erythema  NEUROLOGICAL: Alert, oriented, CN II-XII grossly intact, Appropriate balance PSYCH: Normal behavior, mood   Assessment & Plan:  Assessment: 1. Migraine without aura and without status migrainosus, not intractable   2. Muscle spasm   3. Facial paresthesia      Plan: Migraine is improved in terms of severity but not frequency.  Will increase zonegran to 600mg  daily.   Paresthesia - new problem -  likely effect from zonegran - may worsen with increased dose.  Pt may trial OTC vit B for paresthesias if that worsens.  Call office if unbearable Muscle spasm -stable =  continue monthly massage.  Trial stretching.   Follow-up in 21months or sooner PRN  Paticia Stack, PA-C 02/24/2015 3:37 PM

## 2015-04-14 ENCOUNTER — Encounter: Payer: Self-pay | Admitting: Obstetrics & Gynecology

## 2015-04-14 ENCOUNTER — Ambulatory Visit (INDEPENDENT_AMBULATORY_CARE_PROVIDER_SITE_OTHER): Payer: 59 | Admitting: *Deleted

## 2015-04-14 DIAGNOSIS — G43709 Chronic migraine without aura, not intractable, without status migrainosus: Secondary | ICD-10-CM

## 2015-04-14 MED ORDER — KETOROLAC TROMETHAMINE 60 MG/2ML IM SOLN
60.0000 mg | Freq: Once | INTRAMUSCULAR | Status: AC
  Administered 2015-04-14: 60 mg via INTRAMUSCULAR

## 2015-04-14 NOTE — Progress Notes (Signed)
Patient called and has a severe migraine today.  She requested a Toradol injection.  I have gave patient a Toradol 60 mg injection.

## 2015-05-18 ENCOUNTER — Encounter: Payer: Self-pay | Admitting: *Deleted

## 2015-05-26 ENCOUNTER — Ambulatory Visit (INDEPENDENT_AMBULATORY_CARE_PROVIDER_SITE_OTHER): Payer: 59 | Admitting: Physician Assistant

## 2015-05-26 ENCOUNTER — Encounter: Payer: Self-pay | Admitting: Physician Assistant

## 2015-05-26 VITALS — BP 137/89 | HR 64 | Resp 20 | Ht 66.0 in | Wt 209.0 lb

## 2015-05-26 DIAGNOSIS — G43001 Migraine without aura, not intractable, with status migrainosus: Secondary | ICD-10-CM | POA: Diagnosis not present

## 2015-05-26 DIAGNOSIS — G43009 Migraine without aura, not intractable, without status migrainosus: Secondary | ICD-10-CM

## 2015-05-26 DIAGNOSIS — M62838 Other muscle spasm: Secondary | ICD-10-CM

## 2015-05-26 MED ORDER — ZONISAMIDE 100 MG PO CAPS: 600.0000 mg | ORAL_CAPSULE | Freq: Every day | ORAL | Status: DC

## 2015-05-26 MED ORDER — ORPHENADRINE CITRATE ER 100 MG PO TB12: 100.0000 mg | ORAL_TABLET | Freq: Two times a day (BID) | ORAL | Status: DC | PRN

## 2015-05-26 NOTE — Patient Instructions (Signed)

## 2015-05-26 NOTE — Progress Notes (Signed)
Patient ID: [name], female   DOB: [birth year], 44 y.o.   MRN: [id number] History:  [name] is a 44 y.o. [id number] who presents to clinic today for headaches.  She has had a headache for 4 days now.  She is overstimulated - with hair and scalp hurting.   She requests trigger point injections to abort the pain.  She states she would like to reschedule her routine maintenance visit but needs maintenance meds refilled.    Past Medical History  Diagnosis Date  . Diabetes mellitus 2011    diet controlled,  gestational  . Headache disorder   . Allergy   . Anemia     Social History   Social History  . Marital Status: Married    Spouse Name: N/A  . Number of Children: N/A  . Years of Education: N/A   Occupational History  . Not on file.   Social History Main Topics  . Smoking status: Never Smoker   . Smokeless tobacco: Never Used  . Alcohol Use: No  . Drug Use: No  . Sexual Activity:    Partners: Male    Birth Control/ Protection: Surgical   Other Topics Concern  . Not on file   Social History Narrative    Family History  Problem Relation Age of Onset  . Diabetes Mother   . Heart disease Mother   . Hypertension Mother   . Hyperlipidemia Mother   . Cancer Father 52    Lung Cancer  . Mental illness Sister     bipolar, substance abuse,  clean 2 yrs  . Hypertension Sister   . Diabetes Son   . Cancer Paternal Grandmother 96    breast cancer    Allergies  Allergen Reactions  . Botox [Onabotulinumtoxina] Shortness Of Breath    syncope  . Maxalt [Rizatriptan Benzoate] Anaphylaxis    Chest pain  . Nitrous Oxide Shortness Of Breath    syncope  . Triptans Shortness Of Breath  . Aspirin     Mouth blisters  . Latex     Sometimes causes rash  . Vicodin [Hydrocodone-Acetaminophen]     hallucinations  . Penicillins Rash  . Tetracyclines & Related Rash    Current Outpatient Prescriptions on File Prior to Visit  Medication Sig Dispense Refill  .  albuterol (PROVENTIL HFA;VENTOLIN HFA) 108 (90 BASE) MCG/ACT inhaler Inhale 2 puffs into the lungs every 6 (six) hours as needed for wheezing or shortness of breath. One puff as needed 3.7 g 1  . diazepam (VALIUM) 10 MG tablet Take 1 tablet (10 mg total) by mouth every 12 (twelve) hours as needed (muscle spasm). 30 tablet 1  . ketorolac (TORADOL) 10 MG tablet Take 1 tablet (10 mg total) by mouth every 6 (six) hours as needed. 20 tablet 0  . lisinopril (PRINIVIL,ZESTRIL) 10 MG tablet Take 2 tablets (20 mg total) by mouth daily. 90 tablet 3  . meclizine (ANTIVERT) 25 MG tablet Take 25 mg by mouth 3 (three) times daily as needed for dizziness.    . meloxicam (MOBIC) 15 MG tablet Take 1 tablet (15 mg total) by mouth daily. 30 tablet 4  . metoprolol succinate (TOPROL-XL) 50 MG 24 hr tablet Take 1 tablet (50 mg total) by mouth daily. Take with or immediately following a meal. 90 tablet 1  . ondansetron (ZOFRAN-ODT) 4 MG disintegrating tablet Take 4 mg by mouth every 8 (eight) hours as needed.    . orphenadrine (NORFLEX) 100 MG tablet Take  1 tablet (100 mg total) by mouth 2 (two) times daily as needed. 60 tablet 1  . oxyCODONE-acetaminophen (PERCOCET/ROXICET) 5-325 MG per tablet Take 1 tablet by mouth every 6 (six) hours as needed. Take one by mouth as needed 30 tablet 0  . traMADol (ULTRAM) 50 MG tablet Take 1 tablet (50 mg total) by mouth every 8 (eight) hours as needed. 30 tablet 3  . zonisamide (ZONEGRAN) 100 MG capsule Take 6 capsules (600 mg total) by mouth daily. 180 capsule 2  . ALPRAZolam (XANAX) 0.5 MG tablet Take 1 tablet (0.5 mg total) by mouth daily as needed for sleep. Take one by mouth as needed (Patient not taking: Reported on 05/26/2015) 30 tablet 1   No current facility-administered medications on file prior to visit.     Review of Systems:  All pertinent positive/negative included in HPI, all other review of systems are negative  Objective:  Physical Exam BP 137/89 mmHg  Pulse 64   Resp 20  Ht 5\' 6"  (1.676 m)  Wt 209 lb (94.802 kg)  BMI 33.75 kg/m2 CONSTITUTIONAL: Well-developed, well-nourished female in no acute distress.  ENT: Normocephalic CARDIOVASCULAR: Regular rate and rhythm with no adventitious sounds.  RESPIRATORY: Normal rate. Clear to auscultation bilaterally.  NEUROLOGICAL: Alert, oriented, CN II-XII grossly intact PSYCH: Normal behavior, mood  PROCEDURE:  TRIGGER POINT INJECTIONS  Procedure: Mixture of 1%  Lidocaine, marcaine and dexamethazone in a ratio of 2:2:1  injected with 1cc each site in bilateral greater Occipital Nerves, bilateral lesser occipital nerves, bilateral cervical paraspinal muscles, bilateral trapezius.  Total amount injected: 8cc.  Pt tolerated procedure well.  Greater occipital bilat Lesser occipital bilat Cervical paraspinal  Trapezius  Assessment & Plan:  Assessment: 1. Migraine without aura and without status migrainosus, not intractable   2. Migraine without aura and with status migrainosus, not intractable   3. Muscle spasm      Plan: Trigger point/nerve blocks done to abort status migraine Continue maintenance medications per last visit.   Follow-up in 1-2 months or sooner PRN  Paticia Stack, PA-C 05/26/2015 2:17 PM

## 2015-06-22 ENCOUNTER — Ambulatory Visit (INDEPENDENT_AMBULATORY_CARE_PROVIDER_SITE_OTHER): Payer: 59 | Admitting: *Deleted

## 2015-06-22 ENCOUNTER — Encounter: Payer: Self-pay | Admitting: Obstetrics and Gynecology

## 2015-06-22 DIAGNOSIS — G43809 Other migraine, not intractable, without status migrainosus: Secondary | ICD-10-CM | POA: Diagnosis not present

## 2015-06-22 MED ORDER — KETOROLAC TROMETHAMINE 60 MG/2ML IM SOLN
60.0000 mg | Freq: Once | INTRAMUSCULAR | Status: AC
Start: 2015-06-22 — End: 2015-06-22
  Administered 2015-06-22: 60 mg via INTRAMUSCULAR

## 2015-06-22 MED ORDER — KETOROLAC TROMETHAMINE 60 MG/2ML IM SOLN: 60.0000 mg | Freq: Once | INTRAMUSCULAR | Status: DC

## 2015-06-22 NOTE — Progress Notes (Signed)
Patient came in with a migraine today and needed a Ketorolac injection.  I gave patient Ketorolac 60 mg.

## 2015-07-24 ENCOUNTER — Ambulatory Visit (INDEPENDENT_AMBULATORY_CARE_PROVIDER_SITE_OTHER): Payer: 59 | Admitting: Internal Medicine

## 2015-07-24 ENCOUNTER — Encounter: Payer: Self-pay | Admitting: Internal Medicine

## 2015-07-24 VITALS — BP 130/90 | HR 66 | Temp 98.3°F | Resp 12 | Ht 66.0 in | Wt 210.5 lb

## 2015-07-24 DIAGNOSIS — G4459 Other complicated headache syndrome: Secondary | ICD-10-CM | POA: Diagnosis not present

## 2015-07-24 DIAGNOSIS — E119 Type 2 diabetes mellitus without complications: Secondary | ICD-10-CM

## 2015-07-24 DIAGNOSIS — E785 Hyperlipidemia, unspecified: Secondary | ICD-10-CM | POA: Diagnosis not present

## 2015-07-24 DIAGNOSIS — I10 Essential (primary) hypertension: Secondary | ICD-10-CM | POA: Diagnosis not present

## 2015-07-24 DIAGNOSIS — R0683 Snoring: Secondary | ICD-10-CM

## 2015-07-24 DIAGNOSIS — E1121 Type 2 diabetes mellitus with diabetic nephropathy: Secondary | ICD-10-CM

## 2015-07-24 DIAGNOSIS — E669 Obesity, unspecified: Secondary | ICD-10-CM

## 2015-07-24 DIAGNOSIS — E1169 Type 2 diabetes mellitus with other specified complication: Secondary | ICD-10-CM

## 2015-07-24 LAB — LIPID PANEL
Cholesterol: 196 mg/dL (ref 0–200)
HDL: 45.4 mg/dL (ref >39.00)
LDL Cholesterol: 131 mg/dL — ABNORMAL HIGH (ref 0–99)
NonHDL: 150.38
Total CHOL/HDL Ratio: 4
Triglycerides: 95 mg/dL (ref 0.0–149.0)
VLDL: 19 mg/dL (ref 0.0–40.0)

## 2015-07-24 LAB — COMPREHENSIVE METABOLIC PANEL
ALT: 11 U/L (ref 0–35)
AST: 13 U/L (ref 0–37)
Albumin: 4.6 g/dL (ref 3.5–5.2)
Alkaline Phosphatase: 95 U/L (ref 39–117)
BUN: 15 mg/dL (ref 6–23)
CO2: 26 mEq/L (ref 19–32)
Calcium: 9.4 mg/dL (ref 8.4–10.5)
Chloride: 109 mEq/L (ref 96–112)
Creatinine, Ser: 1.02 mg/dL (ref 0.40–1.20)
GFR: 75.04 mL/min (ref >60.00)
Glucose, Bld: 149 mg/dL — ABNORMAL HIGH (ref 70–99)
Potassium: 3.9 mEq/L (ref 3.5–5.1)
Sodium: 141 mEq/L (ref 135–145)
Total Bilirubin: 0.3 mg/dL (ref 0.2–1.2)
Total Protein: 7.4 g/dL (ref 6.0–8.3)

## 2015-07-24 LAB — MICROALBUMIN / CREATININE URINE RATIO
Creatinine,U: 159.4 mg/dL
Microalb Creat Ratio: 0.8 mg/g (ref 0.0–30.0)
Microalb, Ur: 1.3 mg/dL (ref 0.0–1.9)

## 2015-07-24 LAB — LDL CHOLESTEROL, DIRECT: Direct LDL: 126 mg/dL

## 2015-07-24 LAB — HEMOGLOBIN A1C: Hgb A1c MFr Bld: 6.6 % — ABNORMAL HIGH (ref 4.6–6.5)

## 2015-07-24 MED ORDER — METOPROLOL SUCCINATE ER 50 MG PO TB24: 50.0000 mg | ORAL_TABLET | Freq: Every day | ORAL | Status: DC

## 2015-07-24 NOTE — Progress Notes (Signed)
Subjective:  Patient ID: [name], female    DOB: 04-05-1971  Age: 45 y.o. MRN: [id number]  CC: The primary encounter diagnosis was Hypertension goal BP (blood pressure) < 130/80. Diagnoses of Hyperlipidemia associated with type 2 diabetes mellitus ([place]), Diabetes mellitus without complication ([place]), Headache syndrome, complicated, Controlled type 2 diabetes mellitus with diabetic nephropathy, without long-term current use of insulin ([place]), Snoring, and Obesity (BMI 30-39.9) were also pertinent to this visit.  HPI [name] presents for follow up on  type 2 DM, hypertension,  hyperlipidemia and obesity  She is frustrated by her inability to lose weight.  Diet reviewed:  She is eating only twice daily,  Breakfast is a meat and a fruit.  Lunch is a a bid meal.  No appetite in the evening . She is not exercising.  Does not check blood sugars.   BP at work is lower than here,  diastolic is < 80 most of the time.   Migraine headache today.  Has a daily headache but today is much more severe .  Feeling a pulsating sensation   Recurrent daily headaches. Today is no exception. Particularly bad today.   Sees the headache specialist at [facility],  Last visit was December.  zonogran was increased to 600 mg daily  Gets trigger point injections which help .    She has no prior sleep  evaluation for sleep apnea.Saturay nights are the best . .  Last eye exam nov 2016  Needs reflls    Outpatient Prescriptions Prior to Visit  Medication Sig Dispense Refill  . albuterol (PROVENTIL HFA;VENTOLIN HFA) 108 (90 BASE) MCG/ACT inhaler Inhale 2 puffs into the lungs every 6 (six) hours as needed for wheezing or shortness of breath. One puff as needed 3.7 g 1  . ALPRAZolam (XANAX) 0.5 MG tablet Take 1 tablet (0.5 mg total) by mouth daily as needed for sleep. Take one by mouth as needed 30 tablet 1  . diazepam (VALIUM) 10 MG tablet Take 1 tablet (10 mg total) by mouth every 12 (twelve) hours as  needed (muscle spasm). 30 tablet 1  . ketorolac (TORADOL) 10 MG tablet Take 1 tablet (10 mg total) by mouth every 6 (six) hours as needed. 20 tablet 0  . lisinopril (PRINIVIL,ZESTRIL) 10 MG tablet Take 2 tablets (20 mg total) by mouth daily. 90 tablet 3  . meclizine (ANTIVERT) 25 MG tablet Take 25 mg by mouth 3 (three) times daily as needed for dizziness.    . meloxicam (MOBIC) 15 MG tablet Take 1 tablet (15 mg total) by mouth daily. 30 tablet 4  . ondansetron (ZOFRAN-ODT) 4 MG disintegrating tablet Take 4 mg by mouth every 8 (eight) hours as needed.    . orphenadrine (NORFLEX) 100 MG tablet Take 1 tablet (100 mg total) by mouth 2 (two) times daily as needed. 60 tablet 1  . oxyCODONE-acetaminophen (PERCOCET/ROXICET) 5-325 MG per tablet Take 1 tablet by mouth every 6 (six) hours as needed. Take one by mouth as needed 30 tablet 0  . traMADol (ULTRAM) 50 MG tablet Take 1 tablet (50 mg total) by mouth every 8 (eight) hours as needed. 30 tablet 3  . zonisamide (ZONEGRAN) 100 MG capsule Take 6 capsules (600 mg total) by mouth daily. 180 capsule 2  . metoprolol succinate (TOPROL-XL) 50 MG 24 hr tablet Take 1 tablet (50 mg total) by mouth daily. Take with or immediately following a meal. 90 tablet 1   No facility-administered medications prior to  visit.    Review of Systems;  Patient denies headache, fevers, malaise, unintentional weight loss, skin rash, eye pain, sinus congestion and sinus pain, sore throat, dysphagia,  hemoptysis , cough, dyspnea, wheezing, chest pain, palpitations, orthopnea, edema, abdominal pain, nausea, melena, diarrhea, constipation, flank pain, dysuria, hematuria, urinary  Frequency, nocturia, numbness, tingling, seizures,  Focal weakness, Loss of consciousness,  Tremor, insomnia, depression, anxiety, and suicidal ideation.      Objective:  BP 130/90 mmHg  Pulse 66  Temp(Src) 98.3 F (36.8 C) (Oral)  Resp 12  Ht 5\' 6"  (1.676 m)  Wt 210 lb 8 oz (95.482 kg)  BMI 33.99  kg/m2  SpO2 98%  BP Readings from Last 3 Encounters:  07/24/15 130/90  05/26/15 137/89  02/24/15 122/83    Wt Readings from Last 3 Encounters:  07/24/15 210 lb 8 oz (95.482 kg)  05/26/15 209 lb (94.802 kg)  02/24/15 213 lb (96.616 kg)    General appearance: alert, cooperative and appears stated age Ears: normal TM's and external ear canals both ears Throat: lips, mucosa, and tongue normal; teeth and gums normal Neck: no adenopathy, no carotid bruit, supple, symmetrical, trachea midline and thyroid not enlarged, symmetric, no tenderness/mass/nodules Back: symmetric, no curvature. ROM normal. No CVA tenderness. Lungs: clear to auscultation bilaterally Heart: regular rate and rhythm, S1, S2 normal, no murmur, click, rub or gallop Abdomen: soft, non-tender; bowel sounds normal; no masses,  no organomegaly Pulses: 2+ and symmetric Skin: Skin color, texture, turgor normal. No rashes or lesions Lymph nodes: Cervical, supraclavicular, and axillary nodes normal.  Lab Results  Component Value Date   HGBA1C 6.6* 07/24/2015   HGBA1C 6.7* 01/19/2015   HGBA1C 6.9* 06/10/2014    Lab Results  Component Value Date   CREATININE 1.02 07/24/2015   CREATININE 0.96 01/19/2015   CREATININE 1.0 06/10/2014    Lab Results  Component Value Date   WBC 7.0 10/16/2014   HGB 13.0 10/16/2014   HCT 39.4 10/16/2014   PLT 209 10/16/2014   GLUCOSE 149* 07/24/2015   CHOL 196 07/24/2015   TRIG 95.0 07/24/2015   HDL 45.40 07/24/2015   LDLDIRECT 126.0 07/24/2015   LDLCALC 131* 07/24/2015   ALT 11 07/24/2015   AST 13 07/24/2015   NA 141 07/24/2015   K 3.9 07/24/2015   CL 109 07/24/2015   CREATININE 1.02 07/24/2015   BUN 15 07/24/2015   CO2 26 07/24/2015   TSH 0.619 10/16/2014   HGBA1C 6.6* 07/24/2015   MICROALBUR 1.3 07/24/2015    Ct Head Wo Contrast  12/18/2014  CLINICAL DATA:  Atypical migraine. EXAM: CT HEAD WITHOUT CONTRAST TECHNIQUE: Contiguous axial images were obtained from the base  of the skull through the vertex without intravenous contrast. COMPARISON:  02/16/2010 FINDINGS: No mass lesion. No midline shift. No acute hemorrhage or hematoma. No extra-axial fluid collections. No evidence of acute infarction. Normal brain parenchyma. Normal osseous structures. IMPRESSION: Normal exam. Electronically Signed   By: Lorriane Shire M.D.   On: 12/18/2014 13:44    Assessment & Plan:   Problem List Items Addressed This Visit    Headache syndrome, complicated    Managed by neurology, no improvement with medications.  No prior sleep evaluation       Relevant Medications   metoprolol succinate (TOPROL-XL) 50 MG 24 hr tablet   Other Relevant Orders   Polysomnography 4 or more parameters   Diabetes mellitus type 2, controlled ([place])    Well-controlled on diet alone .  hemoglobin A1c has been  consistently less than 7.0 . Since diagnosis.   Lab Results  Component Value Date   HGBA1C 6.6* 07/24/2015   Lab Results  Component Value Date   MICROALBUR 1.3 07/24/2015    Patient is up-to-date on eye exams and foot exam was done today.  There is  no proteinuria on today's micro urinalysis .               Obesity (BMI 30-39.9)    I have addressed  BMI and recommended a low glycemic index diet utilizing smaller more frequent meals to increase metabolism.  I have also recommended that patient start exercising with a goal of 30 minutes of aerobic exercise a minimum of 5 days per week       Hypertension goal BP (blood pressure) < 130/80 - Primary    Lisinopril dose was increased,  And metoprolol added at last visit.  Home and work BPs are at [place],  No changes today          Relevant Medications   metoprolol succinate (TOPROL-XL) 50 MG 24 hr tablet   Other Relevant Orders   Polysomnography 4 or more parameters   Hyperlipidemia associated with type 2 diabetes mellitus ([place])    New ACC guidelines recommend starting patients aged 71 or higher on moderate intensity statin therapy  for diabetes and concurrent LDL between 70-189.Again recommended trial of simvastatin.   Lab Results  Component Value Date   CHOL 196 07/24/2015   HDL 45.40 07/24/2015   LDLCALC 131* 07/24/2015   LDLDIRECT 126.0 07/24/2015   TRIG 95.0 07/24/2015   CHOLHDL 4 07/24/2015             Relevant Medications   metoprolol succinate (TOPROL-XL) 50 MG 24 hr tablet   Other Relevant Orders   LDL cholesterol, direct (Completed)   Snoring   Relevant Orders   Polysomnography 4 or more parameters    Other Visit Diagnoses    Diabetes mellitus without complication ([facility])        Relevant Orders    Comprehensive metabolic panel (Completed)    Hemoglobin A1c (Completed)    Lipid panel (Completed)    Microalbumin / creatinine urine ratio (Completed)       I am having Ms. Summons maintain her ondansetron, meclizine, ALPRAZolam, oxyCODONE-acetaminophen, traMADol, meloxicam, ketorolac, albuterol, diazepam, lisinopril, zonisamide, orphenadrine, and metoprolol succinate.  Meds ordered this encounter  Medications  . metoprolol succinate (TOPROL-XL) 50 MG 24 hr tablet    Sig: Take 1 tablet (50 mg total) by mouth daily. Take with or immediately following a meal.    Dispense:  90 tablet    Refill:  1    Medications Discontinued During This Encounter  Medication Reason  . metoprolol succinate (TOPROL-XL) 50 MG 24 hr tablet Reorder    Follow-up: No Follow-up on file.   Crecencio Mc, MD

## 2015-07-24 NOTE — Progress Notes (Signed)
Pre visit review using our clinic review tool, if applicable. No additional management support is needed unless otherwise documented below in the visit note. 

## 2015-07-24 NOTE — Patient Instructions (Signed)
Sleep study ordered  You can use meloxicam  for headache if it helps.  max dose one daily   Basic Carbohydrate Counting for Diabetes Mellitus Carbohydrate counting is a method for keeping track of the amount of carbohydrates you eat. Eating carbohydrates naturally increases the level of sugar (glucose) in your blood, so it is important for you to know the amount that is okay for you to have in every meal. Carbohydrate counting helps keep the level of glucose in your blood within normal limits. The amount of carbohydrates allowed is different for every person. A dietitian can help you calculate the amount that is right for you. Once you know the amount of carbohydrates you can have, you can count the carbohydrates in the foods you want to eat. Carbohydrates are found in the following foods:  Grains, such as breads and cereals.  Dried beans and soy products.  Starchy vegetables, such as potatoes, peas, and corn.  Fruit and fruit juices.  Milk and yogurt.  Sweets and snack foods, such as cake, cookies, candy, chips, soft drinks, and fruit drinks. CARBOHYDRATE COUNTING There are two ways to count the carbohydrates in your food. You can use either of the methods or a combination of both. Reading the "Nutrition Facts" on [place] The "Nutrition Facts" is an area that is included on the labels of almost all packaged food and beverages in the [place]. It includes the serving size of that food or beverage and information about the nutrients in each serving of the food, including the grams (g) of carbohydrate per serving.  Decide the number of servings of this food or beverage that you will be able to eat or drink. Multiply that number of servings by the number of grams of carbohydrate that is listed on the label for that serving. The total will be the amount of carbohydrates you will be having when you eat or drink this food or beverage. Learning Standard Serving Sizes of Food When you eat  food that is not packaged or does not include "Nutrition Facts" on the label, you need to measure the servings in order to count the amount of carbohydrates.A serving of most carbohydrate-rich foods contains about 15 g of carbohydrates. The following list includes serving sizes of carbohydrate-rich foods that provide 15 g ofcarbohydrate per serving:   1 slice of bread (1 oz) or 1 six-inch tortilla.    of a hamburger bun or English muffin.  4-6 crackers.   cup unsweetened dry cereal.    cup hot cereal.   cup rice or pasta.    cup mashed potatoes or  of a large baked potato.  1 cup fresh fruit or one small piece of fruit.    cup canned or frozen fruit or fruit juice.  1 cup milk.   cup plain fat-free yogurt or yogurt sweetened with artificial sweeteners.   cup cooked dried beans or starchy vegetable, such as peas, corn, or potatoes.  Decide the number of standard-size servings that you will eat. Multiply that number of servings by 15 (the grams of carbohydrates in that serving). For example, if you eat 2 cups of strawberries, you will have eaten 2 servings and 30 g of carbohydrates (2 servings x 15 g = 30 g). For foods such as soups and casseroles, in which more than one food is mixed in, you will need to count the carbohydrates in each food that is included. EXAMPLE OF CARBOHYDRATE COUNTING Sample Dinner  3 oz chicken breast.  cup of brown rice.   cup of corn.  1 cup milk.   1 cup strawberries with sugar-free whipped topping.  Carbohydrate Calculation Step 1: Identify the foods that contain carbohydrates:   Rice.   Corn.   Milk.   Strawberries. Step 2:Calculate the number of servings eaten of each:   2 servings of rice.   1 serving of corn.   1 serving of milk.   1 serving of strawberries. Step 3: Multiply each of those number of servings by 15 g:   2 servings of rice x 15 g = 30 g.   1 serving of corn x 15 g = 15 g.   1  serving of milk x 15 g = 15 g.   1 serving of strawberries x 15 g = 15 g. Step 4: Add together all of the amounts to find the total grams of carbohydrates eaten: 30 g + 15 g + 15 g + 15 g = 75 g.   This information is not intended to replace advice given to you by your health care provider. Make sure you discuss any questions you have with your health care provider.   Document Released: 05/23/2005 Document Revised: 06/13/2014 Document Reviewed: 04/19/2013 Elsevier Interactive Patient Education [employer].

## 2015-07-26 ENCOUNTER — Encounter: Payer: Self-pay | Admitting: Internal Medicine

## 2015-07-26 DIAGNOSIS — R0683 Snoring: Secondary | ICD-10-CM | POA: Insufficient documentation

## 2015-07-26 NOTE — Assessment & Plan Note (Signed)
I have addressed  BMI and recommended a low glycemic index diet utilizing smaller more frequent meals to increase metabolism.  I have also recommended that patient start exercising with a goal of 30 minutes of aerobic exercise a minimum of 5 days per week.  

## 2015-07-26 NOTE — Assessment & Plan Note (Signed)
Well-controlled on diet alone .  hemoglobin A1c has been consistently less than 7.0 . Since diagnosis.   Lab Results  Component Value Date   HGBA1C 6.6* 07/24/2015   Lab Results  Component Value Date   MICROALBUR 1.3 07/24/2015    Patient is up-to-date on eye exams and foot exam was done today.  There is  no proteinuria on today's micro urinalysis .

## 2015-07-26 NOTE — Assessment & Plan Note (Signed)
New ACC guidelines recommend starting patients aged 46 or higher on moderate intensity statin therapy for diabetes and concurrent LDL between 70-189.Again recommended trial of simvastatin.   Lab Results  Component Value Date   CHOL 196 07/24/2015   HDL 45.40 07/24/2015   LDLCALC 131* 07/24/2015   LDLDIRECT 126.0 07/24/2015   TRIG 95.0 07/24/2015   CHOLHDL 4 07/24/2015

## 2015-07-26 NOTE — Assessment & Plan Note (Signed)
Lisinopril dose was increased,  And metoprolol added at last visit.  Home and work BPs are at [place],  No changes today

## 2015-07-26 NOTE — Assessment & Plan Note (Signed)
Managed by neurology, no improvement with medications.  No prior sleep evaluation

## 2015-07-27 NOTE — Telephone Encounter (Signed)
Please see my chart message concerning ASA allergy, plus allergy noted in chart tried to call patient and advise to hold until could speak with MD unable to reach patient by phone.

## 2015-08-07 ENCOUNTER — Encounter: Payer: Self-pay | Admitting: Physician Assistant

## 2015-08-07 ENCOUNTER — Ambulatory Visit (INDEPENDENT_AMBULATORY_CARE_PROVIDER_SITE_OTHER): Payer: 59 | Admitting: Physician Assistant

## 2015-08-07 VITALS — BP 131/87 | HR 71 | Ht 66.0 in | Wt 211.0 lb

## 2015-08-07 DIAGNOSIS — M62838 Other muscle spasm: Secondary | ICD-10-CM | POA: Diagnosis not present

## 2015-08-07 DIAGNOSIS — G43009 Migraine without aura, not intractable, without status migrainosus: Secondary | ICD-10-CM | POA: Diagnosis not present

## 2015-08-07 DIAGNOSIS — G4700 Insomnia, unspecified: Secondary | ICD-10-CM

## 2015-08-07 DIAGNOSIS — G4489 Other headache syndrome: Secondary | ICD-10-CM | POA: Diagnosis not present

## 2015-08-07 MED ORDER — ZONISAMIDE 100 MG PO CAPS: 600.0000 mg | ORAL_CAPSULE | Freq: Every day | ORAL | Status: DC

## 2015-08-07 MED ORDER — AMITRIPTYLINE HCL 10 MG PO TABS: 10.0000 mg | ORAL_TABLET | Freq: Every day | ORAL | Status: DC

## 2015-08-07 NOTE — Patient Instructions (Signed)
Perimenopause Perimenopause is the time when your body begins to move into the menopause (no menstrual period for 12 straight months). It is a natural process. Perimenopause can begin 2-8 years before the menopause and usually lasts for 1 year after the menopause. During this time, your ovaries may or may not produce an egg. The ovaries vary in their production of estrogen and progesterone hormones each month. This can cause irregular menstrual periods, difficulty getting pregnant, vaginal bleeding between periods, and uncomfortable symptoms. CAUSES  Irregular production of the ovarian hormones, estrogen and progesterone, and not ovulating every month.  Other causes include:  Tumor of the pituitary gland in the brain.  Medical disease that affects the ovaries.  Radiation treatment.  Chemotherapy.  Unknown causes.  Heavy smoking and excessive alcohol intake can bring on perimenopause sooner. SIGNS AND SYMPTOMS   Hot flashes.  Night sweats.  Irregular menstrual periods.  Decreased sex drive.  Vaginal dryness.  Headaches.  Mood swings.  Depression.  Memory problems.  Irritability.  Tiredness.  Weight gain.  Trouble getting pregnant.  The beginning of losing bone cells (osteoporosis).  The beginning of hardening of the arteries (atherosclerosis). DIAGNOSIS  Your health care provider will make a diagnosis by analyzing your age, menstrual history, and symptoms. He or she will do a physical exam and note any changes in your body, especially your female organs. Female hormone tests may or may not be helpful depending on the amount of female hormones you produce and when you produce them. However, other hormone tests may be helpful to rule out other problems. TREATMENT  In some cases, no treatment is needed. The decision on whether treatment is necessary during the perimenopause should be made by you and your health care provider based on how the symptoms are affecting you  and your lifestyle. Various treatments are available, such as:  Treating individual symptoms with a specific medicine for that symptom.  Herbal medicines that can help specific symptoms.  Counseling.  Group therapy. HOME CARE INSTRUCTIONS   Keep track of your menstrual periods (when they occur, how heavy they are, how long between periods, and how long they last) as well as your symptoms and when they started.  Only take over-the-counter or prescription medicines as directed by your health care provider.  Sleep and rest.  Exercise.  Eat a diet that contains calcium (good for your bones) and soy (acts like the estrogen hormone).  Do not smoke.  Avoid alcoholic beverages.  Take vitamin supplements as recommended by your health care provider. Taking vitamin E may help in certain cases.  Take calcium and vitamin D supplements to help prevent bone loss.  Group therapy is sometimes helpful.  Acupuncture may help in some cases. SEEK MEDICAL CARE IF:   You have questions about any symptoms you are having.  You need a referral to a specialist (gynecologist, psychiatrist, or psychologist). SEEK IMMEDIATE MEDICAL CARE IF:   You have vaginal bleeding.  Your period lasts longer than 8 days.  Your periods are recurring sooner than 21 days.  You have bleeding after intercourse.  You have severe depression.  You have pain when you urinate.  You have severe headaches.  You have vision problems.   This information is not intended to replace advice given to you by your health care provider. Make sure you discuss any questions you have with your health care provider.   Document Released: 06/30/2004 Document Revised: 06/13/2014 Document Reviewed: 12/20/2012 Elsevier Interactive Patient Education 2016 Elsevier Inc.  

## 2015-08-07 NOTE — Progress Notes (Signed)
Patient ID: [name], female   DOB: 12-21-1970, 45 y.o.   MRN: [id number] History:  [name] is a 35 y.o. [id number] who presents to clinic today for headache appointment.  She believes hormonal changes are causing HA to worsen.    She has power surges: 10-12/day.  HA's are unrelenting.  She continues to have them nearly contantly. She is using her HA preventives.   These are helpful and she does not believe she should stop any.  She has trouble sleeping and sleeps irregular hours.  She has 2 jobs - one of which is irregular hours.     Past Medical History  Diagnosis Date  . Diabetes mellitus 2011    diet controlled,  gestational  . Headache disorder   . Allergy   . Anemia     Social History   Social History  . Marital Status: Married    Spouse Name: N/A  . Number of Children: N/A  . Years of Education: N/A   Occupational History  . Not on file.   Social History Main Topics  . Smoking status: Never Smoker   . Smokeless tobacco: Never Used  . Alcohol Use: No  . Drug Use: No  . Sexual Activity:    Partners: Male    Birth Control/ Protection: Surgical   Other Topics Concern  . Not on file   Social History Narrative    Family History  Problem Relation Age of Onset  . Diabetes Mother   . Heart disease Mother   . Hypertension Mother   . Hyperlipidemia Mother   . Cancer Father 68    Lung Cancer  . Mental illness Sister     bipolar, substance abuse,  clean 2 yrs  . Hypertension Sister   . Diabetes Son   . Cancer Paternal Grandmother 61    breast cancer    Allergies  Allergen Reactions  . Botox [Onabotulinumtoxina] Shortness Of Breath    syncope  . Kiwi Extract Shortness Of Breath  . Maxalt [Rizatriptan Benzoate] Anaphylaxis    Chest pain  . Nitrous Oxide Shortness Of Breath    syncope  . Triptans Shortness Of Breath  . Aspirin     Mouth blisters  . Hydrocodone-Acetaminophen Other (See Comments)  . Latex     Sometimes causes rash  . Rizatriptan  Other (See Comments)  . Vicodin [Hydrocodone-Acetaminophen]     hallucinations  . Penicillins Rash  . Tetracyclines & Related Rash    Current Outpatient Prescriptions on File Prior to Visit  Medication Sig Dispense Refill  . albuterol (PROVENTIL HFA;VENTOLIN HFA) 108 (90 BASE) MCG/ACT inhaler Inhale 2 puffs into the lungs every 6 (six) hours as needed for wheezing or shortness of breath. One puff as needed 3.7 g 1  . ALPRAZolam (XANAX) 0.5 MG tablet Take 1 tablet (0.5 mg total) by mouth daily as needed for sleep. Take one by mouth as needed 30 tablet 1  . diazepam (VALIUM) 10 MG tablet Take 1 tablet (10 mg total) by mouth every 12 (twelve) hours as needed (muscle spasm). 30 tablet 1  . ketorolac (TORADOL) 10 MG tablet Take 1 tablet (10 mg total) by mouth every 6 (six) hours as needed. 20 tablet 0  . lisinopril (PRINIVIL,ZESTRIL) 10 MG tablet Take 2 tablets (20 mg total) by mouth daily. 90 tablet 3  . meclizine (ANTIVERT) 25 MG tablet Take 25 mg by mouth 3 (three) times daily as needed for dizziness.    . meloxicam (MOBIC)  15 MG tablet Take 1 tablet (15 mg total) by mouth daily. 30 tablet 4  . metoprolol succinate (TOPROL-XL) 50 MG 24 hr tablet Take 1 tablet (50 mg total) by mouth daily. Take with or immediately following a meal. 90 tablet 1  . ondansetron (ZOFRAN-ODT) 4 MG disintegrating tablet Take 4 mg by mouth every 8 (eight) hours as needed.    . orphenadrine (NORFLEX) 100 MG tablet Take 1 tablet (100 mg total) by mouth 2 (two) times daily as needed. 60 tablet 1  . oxyCODONE-acetaminophen (PERCOCET/ROXICET) 5-325 MG per tablet Take 1 tablet by mouth every 6 (six) hours as needed. Take one by mouth as needed 30 tablet 0  . traMADol (ULTRAM) 50 MG tablet Take 1 tablet (50 mg total) by mouth every 8 (eight) hours as needed. 30 tablet 3  . zonisamide (ZONEGRAN) 100 MG capsule Take 6 capsules (600 mg total) by mouth daily. 180 capsule 2   No current facility-administered medications on file  prior to visit.   Review of Systems:  All pertinent positive/negative included in HPI, all other review of systems are negative  Objective:  Physical Exam BP 131/87 mmHg  Pulse 71  Ht 5\' 6"  (1.676 m)  Wt 211 lb (95.709 kg)  BMI 34.07 kg/m2 CONSTITUTIONAL: Well-developed, well-nourished female in no acute distress.  EYES: EOM intact ENT: Normocephalic CARDIOVASCULAR: Regular rate and rhythm with no adventitious sounds.  RESPIRATORY: Normal rate. Clear to auscultation bilaterally.  ENDOCRINE: Normal thyroid.  MUSCULOSKELETAL: Normal ROM, strength equal bilaterally, bilat muscle spasm noted SKIN: Warm, dry without erythema  NEUROLOGICAL: Alert, oriented, CN II-XII grossly intact, Appropriate balance, No dysmetria PSYCH: Normal behavior, mood   Assessment & Plan:  Assessment: 1. Headache associated with hormonal factors   2. Migraine without aura and without status migrainosus, not intractable   3. Muscle spasm   4. Insomnia    New diagnosis of Insomnia. Hormonal factors worsening HA No improvement in migraine/Muscle Spasm  Plan: Continue HA preventive Zonegran at 647mkg daily Begin Amitriptyline 10mg  qhs to help with sleep and migraine prevention Continue norflex for muscle spasm/acute migraine Will check FSH to see if hormonal levels suggest menopause as symptoms suggest so F/u with [facility] in 1 month Follow-up for HA mgmt 2 months or sooner PRN  Paticia Stack, PA-C 08/07/2015 9:20 AM

## 2015-08-08 LAB — FOLLICLE STIMULATING HORMONE: FSH: 57.5 m[IU]/mL

## 2015-08-13 ENCOUNTER — Encounter: Payer: Self-pay | Admitting: Physician Assistant

## 2015-08-17 ENCOUNTER — Encounter: Payer: Self-pay | Admitting: Internal Medicine

## 2015-08-18 ENCOUNTER — Telehealth: Payer: Self-pay | Admitting: Family Medicine

## 2015-08-18 ENCOUNTER — Ambulatory Visit (INDEPENDENT_AMBULATORY_CARE_PROVIDER_SITE_OTHER): Payer: 59 | Admitting: Family Medicine

## 2015-08-18 ENCOUNTER — Telehealth: Payer: Self-pay | Admitting: Internal Medicine

## 2015-08-18 ENCOUNTER — Encounter: Payer: Self-pay | Admitting: Internal Medicine

## 2015-08-18 ENCOUNTER — Telehealth: Payer: Self-pay | Admitting: *Deleted

## 2015-08-18 ENCOUNTER — Ambulatory Visit (INDEPENDENT_AMBULATORY_CARE_PROVIDER_SITE_OTHER)
Admission: RE | Admit: 2015-08-18 | Discharge: 2015-08-18 | Disposition: A | Discharge status: Home / Self Care | Payer: 59 | Source: Ambulatory Visit | Attending: Family Medicine | Admitting: Family Medicine

## 2015-08-18 VITALS — BP 132/86 | HR 86 | Temp 98.8°F | Ht 66.0 in | Wt 208.6 lb

## 2015-08-18 DIAGNOSIS — R05 Cough: Secondary | ICD-10-CM

## 2015-08-18 DIAGNOSIS — J209 Acute bronchitis, unspecified: Secondary | ICD-10-CM | POA: Diagnosis not present

## 2015-08-18 DIAGNOSIS — R509 Fever, unspecified: Secondary | ICD-10-CM | POA: Diagnosis not present

## 2015-08-18 DIAGNOSIS — R059 Cough, unspecified: Secondary | ICD-10-CM

## 2015-08-18 DIAGNOSIS — R0602 Shortness of breath: Secondary | ICD-10-CM | POA: Diagnosis not present

## 2015-08-18 MED ORDER — OSELTAMIVIR PHOSPHATE 75 MG PO CAPS: 75.0000 mg | ORAL_CAPSULE | Freq: Two times a day (BID) | ORAL | Status: DC

## 2015-08-18 MED ORDER — OSELTAMIVIR PHOSPHATE 75 MG PO CAPS: 75.0000 mg | ORAL_CAPSULE | Freq: Every day | ORAL | Status: DC

## 2015-08-18 MED ORDER — PREDNISONE 10 MG PO TABS: ORAL_TABLET | ORAL | Status: DC

## 2015-08-18 MED ORDER — BENZONATATE 200 MG PO CAPS: 200.0000 mg | ORAL_CAPSULE | Freq: Two times a day (BID) | ORAL | Status: DC | PRN

## 2015-08-18 NOTE — Telephone Encounter (Signed)
Patient c/o of body aches reported temp of 103 on 08/17/15. Congestion with clear nasal discharge, cough audible scratchy throat on phone .  FYI

## 2015-08-18 NOTE — Patient Instructions (Signed)
Nice to meet you. Your illness is likely viral in nature and could be influenza. We will treat you prophylactically with Tamiflu given your history of diabetes. We will obtain a chest x-ray to evaluate this further. You can take Tessalon for cough. Once the chest x-ray results come back we will call you to let you know if you need an antibiotic or prednisone. If you develop chest pain, shortness of breath, fevers, palpitations, cough productive of blood, or any new or changing symptoms

## 2015-08-18 NOTE — Telephone Encounter (Signed)
After talking with patient scheduled with Dr. Caryl Bis today at 11.30, patient barely audible on phone scratchy sounding voice reporting temp 103 last night.

## 2015-08-18 NOTE — Telephone Encounter (Signed)
Called patient and advised that the CXR revealed no PNA. We will treat with prednisone and tamiflu for likely viral or influenza induced bronchitis. I also called the pharmacy and advised on the other phone note regarding tamiflu dosage. Patient voiced understanding.

## 2015-08-18 NOTE — Progress Notes (Signed)
Pre visit review using our clinic review tool, if applicable. No additional management support is needed unless otherwise documented below in the visit note. 

## 2015-08-18 NOTE — Telephone Encounter (Signed)
[place] would like a call back in regards to the Tamiflu prescription sent over. The pharmacy needed clarity on the dosage.  Please advise  [phone]

## 2015-08-19 ENCOUNTER — Other Ambulatory Visit: Payer: Self-pay | Admitting: Internal Medicine

## 2015-08-19 ENCOUNTER — Encounter: Payer: Self-pay | Admitting: Family Medicine

## 2015-08-19 DIAGNOSIS — J209 Acute bronchitis, unspecified: Secondary | ICD-10-CM | POA: Insufficient documentation

## 2015-08-19 NOTE — Progress Notes (Signed)
Patient ID: [name], female   DOB: Jul 16, 1970, 45 y.o.   MRN: [id number]  Tommi Rumps, MD Phone: [phone]  [name] is a 45 y.o. female who presents today for same-day visit.  Patient notes symptoms started on Saturday with scratchy throat. On Sunday throat became more scratchy and she felt increasingly tired. On Monday she developed a temperature of 103F. She has been coughing. Not productive. She notes clear drainage from her nose. She had e-visit with a physician who advised her that it was likely viral. She notes her sore throat is gone. She did develop body aches yesterday. She notes mild sharp central chest discomfort with cough, though no other chest pain or chest pressure. No radiation of this discomfort. No exertional component of this discomfort. No diaphoresis. Some mild shortness of breath with exertion that improves with using her inhaler. No wheezing. Her son had similar symptoms and was diagnosed with a viral illness and treated with Tamiflu. No chest pain or shortness of breath at this time.  PMH: nonsmoker.   ROS see history of present illness  Objective  Physical Exam Filed Vitals:   08/18/15 1136  BP: 132/86  Pulse: 86  Temp: 98.8 F (37.1 C)    BP Readings from Last 3 Encounters:  08/18/15 132/86  08/07/15 131/87  07/24/15 130/90   Wt Readings from Last 3 Encounters:  08/18/15 208 lb 9.6 oz (94.62 kg)  08/07/15 211 lb (95.709 kg)  07/24/15 210 lb 8 oz (95.482 kg)    Physical Exam  Constitutional: She is well-developed, well-nourished, and in no distress.  HENT:  Head: Normocephalic and atraumatic.  Right Ear: External ear normal.  Left Ear: External ear normal.  Mouth/Throat: Oropharynx is clear and moist. No oropharyngeal exudate.  Eyes: Conjunctivae are normal. Pupils are equal, round, and reactive to light.  Neck: Neck supple.  Cardiovascular: Normal rate, regular rhythm and normal heart sounds.  Exam reveals no gallop and no  friction rub.   No murmur heard. Pulmonary/Chest: Effort normal and breath sounds normal. No respiratory distress. She has no wheezes. She has no rales.  Lymphadenopathy:    She has no cervical adenopathy.  Neurological: She is alert. Gait normal.  Skin: Skin is warm and dry. She is not diaphoretic.     Assessment/Plan: Please see individual problem list.  Acute bronchitis Patient's symptoms consistent with either influenza or viral bronchitis. She is well-appearing. She has a benign lung exam. Her vital signs are stable. Given her history of diabetes would favor treating her with Tamiflu given her history of present illness this concerning for influenza. Given that we will treat her regardless of flu test results we will hold off on flu test at this time. We will obtain a chest x-ray given her mild shortness of breath to evaluate for pneumonia. If there is pneumonia we'll treat with antibiotics and if there is not pneumonia we will treat with prednisone. Suspect sharp chest pain is related to costochondral irritation from coughing. Unlikely cardiac given atypical nature. She will continue her inhaler. We'll treat with Tessalon for cough. She'll continue to monitor. She's given return precautions.    Orders Placed This Encounter  Procedures  . DG Chest 2 View    Standing Status: Future     Number of Occurrences: 1     Standing Expiration Date: 10/17/2016    Order Specific Question:  Reason for Exam (SYMPTOM  OR DIAGNOSIS REQUIRED)    Answer:  cough, shortness of breath,  fever    Order Specific Question:  Is the patient pregnant?    Answer:  No    Order Specific Question:  Preferred imaging location?    Answer:  [facility] ordered this encounter  Medications  . benzonatate (TESSALON) 200 MG capsule    Sig: Take 1 capsule (200 mg total) by mouth 2 (two) times daily as needed for cough.    Dispense:  20 capsule    Refill:  0  . DISCONTD: oseltamivir (TAMIFLU) 75 MG  capsule    Sig: Take 1 capsule (75 mg total) by mouth daily.    Dispense:  5 capsule    Refill:  0    Tommi Rumps, MD [place]

## 2015-08-19 NOTE — Assessment & Plan Note (Signed)
Patient's symptoms consistent with either influenza or viral bronchitis. She is well-appearing. She has a benign lung exam. Her vital signs are stable. Given her history of diabetes would favor treating her with Tamiflu given her history of present illness this concerning for influenza. Given that we will treat her regardless of flu test results we will hold off on flu test at this time. We will obtain a chest x-ray given her mild shortness of breath to evaluate for pneumonia. If there is pneumonia we'll treat with antibiotics and if there is not pneumonia we will treat with prednisone. Suspect sharp chest pain is related to costochondral irritation from coughing. Unlikely cardiac given atypical nature. She will continue her inhaler. We'll treat with Tessalon for cough. She'll continue to monitor. She's given return precautions.

## 2015-09-24 ENCOUNTER — Other Ambulatory Visit: Payer: Self-pay | Admitting: Internal Medicine

## 2015-09-29 ENCOUNTER — Telehealth: Payer: Self-pay | Admitting: *Deleted

## 2015-09-29 ENCOUNTER — Other Ambulatory Visit: Payer: Self-pay | Admitting: *Deleted

## 2015-09-29 MED ORDER — LISINOPRIL 10 MG PO TABS
20.0000 mg | ORAL_TABLET | Freq: Every day | ORAL | Status: DC
Start: 2015-09-29 — End: 2016-07-22

## 2015-09-29 NOTE — Telephone Encounter (Signed)
Ovid Curd from [facility] request a call, to get clarity on pt. lisinopril Rx  Contact [phone]

## 2015-09-29 NOTE — Telephone Encounter (Signed)
Resent Lisinopril with correct quantity. Should have sent in #180, not #90. Pt notified also

## 2015-09-29 NOTE — Telephone Encounter (Signed)
Left correct refill quantity on voicemail & sent in new Rx. Pt also notified

## 2015-10-04 ENCOUNTER — Encounter: Payer: Self-pay | Admitting: Radiology

## 2015-10-04 ENCOUNTER — Emergency Department: Payer: 59

## 2015-10-04 ENCOUNTER — Emergency Department
Admission: EM | Admit: 2015-10-04 | Discharge: 2015-10-05 | Disposition: A | Discharge status: Home / Self Care | Payer: 59 | Attending: Emergency Medicine | Admitting: Emergency Medicine

## 2015-10-04 DIAGNOSIS — E669 Obesity, unspecified: Secondary | ICD-10-CM | POA: Diagnosis not present

## 2015-10-04 DIAGNOSIS — E119 Type 2 diabetes mellitus without complications: Secondary | ICD-10-CM | POA: Insufficient documentation

## 2015-10-04 DIAGNOSIS — B9689 Other specified bacterial agents as the cause of diseases classified elsewhere: Secondary | ICD-10-CM

## 2015-10-04 DIAGNOSIS — Z791 Long term (current) use of non-steroidal anti-inflammatories (NSAID): Secondary | ICD-10-CM | POA: Diagnosis not present

## 2015-10-04 DIAGNOSIS — E785 Hyperlipidemia, unspecified: Secondary | ICD-10-CM | POA: Diagnosis not present

## 2015-10-04 DIAGNOSIS — J45909 Unspecified asthma, uncomplicated: Secondary | ICD-10-CM | POA: Insufficient documentation

## 2015-10-04 DIAGNOSIS — R1031 Right lower quadrant pain: Secondary | ICD-10-CM | POA: Diagnosis not present

## 2015-10-04 DIAGNOSIS — N200 Calculus of kidney: Secondary | ICD-10-CM | POA: Diagnosis not present

## 2015-10-04 DIAGNOSIS — I10 Essential (primary) hypertension: Secondary | ICD-10-CM | POA: Diagnosis not present

## 2015-10-04 DIAGNOSIS — N760 Acute vaginitis: Secondary | ICD-10-CM | POA: Diagnosis not present

## 2015-10-04 HISTORY — DX: Unspecified asthma, uncomplicated: J45.909

## 2015-10-04 LAB — URINALYSIS COMPLETE WITH MICROSCOPIC (ARMC ONLY)
Bilirubin Urine: NEGATIVE
Glucose, UA: NEGATIVE mg/dL
Hgb urine dipstick: NEGATIVE
Ketones, ur: NEGATIVE mg/dL
Leukocytes, UA: NEGATIVE
Nitrite: NEGATIVE
Protein, ur: NEGATIVE mg/dL
Specific Gravity, Urine: 1.011 (ref 1.005–1.030)
pH: 6 (ref 5.0–8.0)

## 2015-10-04 LAB — CBC
HCT: 39.8 % (ref 35.0–47.0)
Hemoglobin: 13.5 g/dL (ref 12.0–16.0)
MCH: 29.9 pg (ref 26.0–34.0)
MCHC: 33.8 g/dL (ref 32.0–36.0)
MCV: 88.4 fL (ref 80.0–100.0)
Platelets: 173 10*3/uL (ref 150–440)
RBC: 4.5 MIL/uL (ref 3.80–5.20)
RDW: 13.2 % (ref 11.5–14.5)
WBC: 7.7 10*3/uL (ref 3.6–11.0)

## 2015-10-04 LAB — COMPREHENSIVE METABOLIC PANEL
ALT: 13 U/L — ABNORMAL LOW (ref 14–54)
AST: 19 U/L (ref 15–41)
Albumin: 4.6 g/dL (ref 3.5–5.0)
Alkaline Phosphatase: 93 U/L (ref 38–126)
Anion gap: 9 (ref 5–15)
BUN: 16 mg/dL (ref 6–20)
CO2: 23 mmol/L (ref 22–32)
Calcium: 9.9 mg/dL (ref 8.9–10.3)
Chloride: 111 mmol/L (ref 101–111)
Creatinine, Ser: 0.99 mg/dL (ref 0.44–1.00)
GFR calc Af Amer: >60 mL/min (ref >60)
GFR calc non Af Amer: >60 mL/min (ref >60)
Glucose, Bld: 107 mg/dL — ABNORMAL HIGH (ref 65–99)
Potassium: 3.6 mmol/L (ref 3.5–5.1)
Sodium: 143 mmol/L (ref 135–145)
Total Bilirubin: 0.4 mg/dL (ref 0.3–1.2)
Total Protein: 8 g/dL (ref 6.5–8.1)

## 2015-10-04 LAB — CHLAMYDIA/NGC RT PCR (ARMC ONLY)
Chlamydia Tr: NOT DETECTED
N gonorrhoeae: NOT DETECTED

## 2015-10-04 LAB — LIPASE, BLOOD: Lipase: 19 U/L (ref 11–51)

## 2015-10-04 LAB — WET PREP, GENITAL
Sperm: NONE SEEN
Trich, Wet Prep: NONE SEEN
Yeast Wet Prep HPF POC: NONE SEEN

## 2015-10-04 MED ORDER — ONDANSETRON HCL 4 MG/2ML IJ SOLN
4.0000 mg | Freq: Once | INTRAMUSCULAR | Status: AC
  Administered 2015-10-04: 4 mg via INTRAVENOUS
  Filled 2015-10-04: qty 2

## 2015-10-04 MED ORDER — PROMETHAZINE HCL 25 MG PO TABS
25.0000 mg | ORAL_TABLET | Freq: Once | ORAL | Status: DC
  Filled 2015-10-04: qty 1

## 2015-10-04 MED ORDER — ONDANSETRON HCL 4 MG PO TABS: 4.0000 mg | ORAL_TABLET | Freq: Three times a day (TID) | ORAL | Status: DC | PRN

## 2015-10-04 MED ORDER — IOPAMIDOL (ISOVUE-300) INJECTION 61%
100.0000 mL | Freq: Once | INTRAVENOUS | Status: AC | PRN
  Administered 2015-10-04: 100 mL via INTRAVENOUS

## 2015-10-04 MED ORDER — HYDROMORPHONE HCL 1 MG/ML IJ SOLN
1.0000 mg | Freq: Once | INTRAMUSCULAR | Status: AC
  Administered 2015-10-04: 1 mg via INTRAVENOUS
  Filled 2015-10-04: qty 1

## 2015-10-04 MED ORDER — KETOROLAC TROMETHAMINE 30 MG/ML IJ SOLN
30.0000 mg | Freq: Once | INTRAMUSCULAR | Status: AC
  Administered 2015-10-04: 30 mg via INTRAVENOUS
  Filled 2015-10-04: qty 1

## 2015-10-04 MED ORDER — METRONIDAZOLE 500 MG PO TABS
500.0000 mg | ORAL_TABLET | Freq: Once | ORAL | Status: AC
  Administered 2015-10-05: 500 mg via ORAL
  Filled 2015-10-04: qty 1

## 2015-10-04 MED ORDER — DIATRIZOATE MEGLUMINE & SODIUM 66-10 % PO SOLN
15.0000 mL | Freq: Once | ORAL | Status: AC
  Administered 2015-10-04: 15 mL via ORAL
  Filled 2015-10-04: qty 30

## 2015-10-04 MED ORDER — HYDROMORPHONE HCL 1 MG/ML IJ SOLN
0.5000 mg | Freq: Once | INTRAMUSCULAR | Status: AC
  Administered 2015-10-04: 0.5 mg via INTRAVENOUS
  Filled 2015-10-04: qty 1

## 2015-10-04 MED ORDER — SODIUM CHLORIDE 0.9 % IV BOLUS (SEPSIS)
1000.0000 mL | Freq: Once | INTRAVENOUS | Status: AC
Start: 2015-10-04 — End: 2015-10-04
  Administered 2015-10-04: 1000 mL via INTRAVENOUS

## 2015-10-04 MED ORDER — ONDANSETRON 4 MG PO TBDP
ORAL_TABLET | ORAL | Status: AC
  Filled 2015-10-04: qty 1

## 2015-10-04 MED ORDER — METOCLOPRAMIDE HCL 5 MG/ML IJ SOLN
10.0000 mg | Freq: Once | INTRAMUSCULAR | Status: AC
  Administered 2015-10-04: 10 mg via INTRAVENOUS
  Filled 2015-10-04: qty 2

## 2015-10-04 MED ORDER — ONDANSETRON 4 MG PO TBDP
4.0000 mg | ORAL_TABLET | Freq: Once | ORAL | Status: AC
  Administered 2015-10-04: 4 mg via ORAL

## 2015-10-04 MED ORDER — METRONIDAZOLE 500 MG PO TABS: 500.0000 mg | ORAL_TABLET | Freq: Two times a day (BID) | ORAL | Status: DC

## 2015-10-04 NOTE — ED Notes (Signed)
Pt resting in bed, updated on ct result process.

## 2015-10-04 NOTE — ED Notes (Signed)
Pt updated on ultrasound progress. Pt provided with ice chips per request with md consent.

## 2015-10-04 NOTE — ED Notes (Signed)
Pt reports improved nausea.  

## 2015-10-04 NOTE — ED Notes (Signed)
Pt transported to ultrasound.

## 2015-10-04 NOTE — Discharge Instructions (Signed)
You were evaluated for right-sided abdominal pain, and although no certain cause was found, your exam and evaluation are reassuring in the emergency department today.  Return to emergency department for any worsening condition including worsening abdominal pain, black or bloody stools, concern for dehydration, fever, or any other symptoms concerning to you.   Abdominal Pain, Adult Many things can cause abdominal pain. Usually, abdominal pain is not caused by a disease and will improve without treatment. It can often be observed and treated at home. Your health care provider will do a physical exam and possibly order blood tests and X-rays to help determine the seriousness of your pain. However, in many cases, more time must pass before a clear cause of the pain can be found. Before that point, your health care provider may not know if you need more testing or further treatment. HOME CARE INSTRUCTIONS Monitor your abdominal pain for any changes. The following actions may help to alleviate any discomfort you are experiencing:  Only take over-the-counter or prescription medicines as directed by your health care provider.  Do not take laxatives unless directed to do so by your health care provider.  Try a clear liquid diet (broth, tea, or water) as directed by your health care provider. Slowly move to a bland diet as tolerated. SEEK MEDICAL CARE IF:  You have unexplained abdominal pain.  You have abdominal pain associated with nausea or diarrhea.  You have pain when you urinate or have a bowel movement.  You experience abdominal pain that wakes you in the night.  You have abdominal pain that is worsened or improved by eating food.  You have abdominal pain that is worsened with eating fatty foods.  You have a fever. SEEK IMMEDIATE MEDICAL CARE IF:  Your pain does not go away within 2 hours.  You keep throwing up (vomiting).  Your pain is felt only in portions of the abdomen, such as  the right side or the left lower portion of the abdomen.  You pass bloody or black tarry stools. MAKE SURE YOU:  Understand these instructions.  Will watch your condition.  Will get help right away if you are not doing well or get worse.   This information is not intended to replace advice given to you by your health care provider. Make sure you discuss any questions you have with your health care provider.   Document Released: 03/02/2005 Document Revised: 02/11/2015 Document Reviewed: 01/30/2013 Elsevier Interactive Patient Education 2016 Elsevier Inc.  Bacterial Vaginosis Bacterial vaginosis is a vaginal infection that occurs when the normal balance of bacteria in the vagina is disrupted. It results from an overgrowth of certain bacteria. This is the most common vaginal infection in women of childbearing age. Treatment is important to prevent complications, especially in pregnant women, as it can cause a premature delivery. CAUSES  Bacterial vaginosis is caused by an increase in harmful bacteria that are normally present in smaller amounts in the vagina. Several different kinds of bacteria can cause bacterial vaginosis. However, the reason that the condition develops is not fully understood. RISK FACTORS Certain activities or behaviors can put you at an increased risk of developing bacterial vaginosis, including:  Having a new sex partner or multiple sex partners.  Douching.  Using an intrauterine device (IUD) for contraception. Women do not get bacterial vaginosis from toilet seats, bedding, swimming pools, or contact with objects around them. SIGNS AND SYMPTOMS  Some women with bacterial vaginosis have no signs or symptoms. Common symptoms include:  Grey vaginal discharge.  A fishlike odor with discharge, especially after sexual intercourse.  Itching or burning of the vagina and vulva.  Burning or pain with urination. DIAGNOSIS  Your health care provider will take a  medical history and examine the vagina for signs of bacterial vaginosis. A sample of vaginal fluid may be taken. Your health care provider will look at this sample under a microscope to check for bacteria and abnormal cells. A vaginal pH test may also be done.  TREATMENT  Bacterial vaginosis may be treated with antibiotic medicines. These may be given in the form of a pill or a vaginal cream. A second round of antibiotics may be prescribed if the condition comes back after treatment. Because bacterial vaginosis increases your risk for sexually transmitted diseases, getting treated can help reduce your risk for chlamydia, gonorrhea, HIV, and herpes. HOME CARE INSTRUCTIONS   Only take over-the-counter or prescription medicines as directed by your health care provider.  If antibiotic medicine was prescribed, take it as directed. Make sure you finish it even if you start to feel better.  Tell all sexual partners that you have a vaginal infection. They should see their health care provider and be treated if they have problems, such as a mild rash or itching.  During treatment, it is important that you follow these instructions:  Avoid sexual activity or use condoms correctly.  Do not douche.  Avoid alcohol as directed by your health care provider.  Avoid breastfeeding as directed by your health care provider. SEEK MEDICAL CARE IF:   Your symptoms are not improving after 3 days of treatment.  You have increased discharge or pain.  You have a fever. MAKE SURE YOU:   Understand these instructions.  Will watch your condition.  Will get help right away if you are not doing well or get worse. FOR MORE INFORMATION  Centers for Disease Control and Prevention, Division of STD Prevention: [employer]raud[employer] American Sexual Health Association (ASHA): www.ashastd.org    This information is not intended to replace advice given to you by your health care provider. Make sure you discuss any questions  you have with your health care provider.   Document Released: 05/23/2005 Document Revised: 06/13/2014 Document Reviewed: 01/02/2013 Elsevier Interactive Patient Education [employer].

## 2015-10-04 NOTE — ED Notes (Signed)
Pt states continues to feel nauseated. md notified, no new orders received.

## 2015-10-04 NOTE — ED Notes (Signed)
Pt requesting additional nausea medication. md notified.

## 2015-10-04 NOTE — ED Notes (Signed)
Patient transported to Ultrasound 

## 2015-10-04 NOTE — ED Notes (Signed)
Patient transported to CT 

## 2015-10-04 NOTE — ED Notes (Signed)
Pt updated on ultrasound process. Pt verbalizes understanding.  

## 2015-10-04 NOTE — ED Provider Notes (Signed)
[facility] Emergency Department Provider Note   ____________________________________________  Time seen: Approximately 3 PM I have reviewed the triage vital signs and the triage nursing note.  HISTORY  Chief Complaint Abdominal Pain   Historian Patient  HPI [name] is a 45 y.o. female with a history of prior kidney stones, who is reporting right lower quadrant pain since Friday. The waxing and waning, but never fully gone. Denies dysuria, or frequency of urine. This pain does feel somewhat similar to when she had a kidney stone in the past. No fever. Positive for nausea. Pain is now getting worse to the point it is about a 9 out of 10.  Denies vomiting or diarrhea.  She also reports a migraine headache since last Wednesday.    Past Medical History  Diagnosis Date  . Diabetes mellitus 2011    diet controlled,  gestational  . Headache disorder   . Allergy   . Anemia   . Asthma     Patient Active Problem List   Diagnosis Date Noted  . Acute bronchitis 08/19/2015  . Snoring 07/26/2015  . Migraine without aura and without status migrainosus, not intractable 02/24/2015  . Muscle spasm 02/24/2015  . Facial paresthesia 02/24/2015  . Concussion with loss of consciousness 07/21/2014  . Hyperlipidemia associated with type 2 diabetes mellitus ([place]) 06/13/2014  . Pain in joint, shoulder region 08/11/2013  . Vitamin D deficiency 05/07/2013  . S/P Total Abdominal Hysterectomy and Left Salpingo-oophorectomy 10/20/2011  . Headache disorder   . Headache syndrome, complicated 99991111  . Diabetes mellitus type 2, controlled ([place]) 07/20/2011  . Obesity (BMI 30-39.9) 07/20/2011  . Hypertension goal BP (blood pressure) < 130/80 07/20/2011    Past Surgical History  Procedure Laterality Date  . Hernia repair  2003    left inguinal   . Breast surgery      right breast x 2 , benign  . Tubal ligation    . Abdominal hysterectomy  2006    heavy  menses, endometriosis, l oophrectomy  . Left oophorectomy      Current Outpatient Rx  Name  Route  Sig  Dispense  Refill  . albuterol (PROVENTIL HFA;VENTOLIN HFA) 108 (90 BASE) MCG/ACT inhaler   Inhalation   Inhale 2 puffs into the lungs every 6 (six) hours as needed for wheezing or shortness of breath. One puff as needed   3.7 g   1   . ALPRAZolam (XANAX) 0.5 MG tablet   Oral   Take 1 tablet (0.5 mg total) by mouth daily as needed for sleep. Take one by mouth as needed   30 tablet   1   . amitriptyline (ELAVIL) 10 MG tablet   Oral   Take 1 tablet (10 mg total) by mouth at bedtime.   30 tablet   2   . diazepam (VALIUM) 10 MG tablet   Oral   Take 1 tablet (10 mg total) by mouth every 12 (twelve) hours as needed (muscle spasm).   30 tablet   1   . ketorolac (TORADOL) 10 MG tablet   Oral   Take 1 tablet (10 mg total) by mouth every 6 (six) hours as needed.   20 tablet   0   . lisinopril (PRINIVIL,ZESTRIL) 10 MG tablet   Oral   Take 2 tablets (20 mg total) by mouth daily.   180 tablet   3   . meclizine (ANTIVERT) 25 MG tablet   Oral   Take 25  mg by mouth 3 (three) times daily as needed for dizziness.         . meloxicam (MOBIC) 15 MG tablet   Oral   Take 1 tablet (15 mg total) by mouth daily.   30 tablet   4   . metoprolol succinate (TOPROL-XL) 50 MG 24 hr tablet   Oral   Take 1 tablet (50 mg total) by mouth daily. Take with or immediately following a meal.   90 tablet   1   . ondansetron (ZOFRAN-ODT) 4 MG disintegrating tablet   Oral   Take 4 mg by mouth every 8 (eight) hours as needed.         . orphenadrine (NORFLEX) 100 MG tablet   Oral   Take 1 tablet (100 mg total) by mouth 2 (two) times daily as needed.   60 tablet   1   . oxyCODONE-acetaminophen (PERCOCET/ROXICET) 5-325 MG per tablet   Oral   Take 1 tablet by mouth every 6 (six) hours as needed. Take one by mouth as needed   30 tablet   0   . simvastatin (ZOCOR) 20 MG tablet   Oral    Take 20 mg by mouth daily.         . traMADol (ULTRAM) 50 MG tablet   Oral   Take 1 tablet (50 mg total) by mouth every 8 (eight) hours as needed.   30 tablet   3   . zonisamide (ZONEGRAN) 100 MG capsule   Oral   Take 6 capsules (600 mg total) by mouth daily.   180 capsule   2   . metroNIDAZOLE (FLAGYL) 500 MG tablet   Oral   Take 1 tablet (500 mg total) by mouth 2 (two) times daily.   20 tablet   0   . ondansetron (ZOFRAN) 4 MG tablet   Oral   Take 1 tablet (4 mg total) by mouth every 8 (eight) hours as needed for nausea or vomiting.   10 tablet   0     Allergies Botox; Kiwi extract; Maxalt; Nitrous oxide; Triptans; Aspirin; Hydrocodone-acetaminophen; Latex; Rizatriptan; Vicodin; Penicillins; and Tetracyclines & related  Family History  Problem Relation Age of Onset  . Diabetes Mother   . Heart disease Mother   . Hypertension Mother   . Hyperlipidemia Mother   . Cancer Father 78    Lung Cancer  . Mental illness Sister     bipolar, substance abuse,  clean 2 yrs  . Hypertension Sister   . Diabetes Son   . Cancer Paternal Grandmother 71    breast cancer    Social History Social History  Substance Use Topics  . Smoking status: Never Smoker   . Smokeless tobacco: Never Used  . Alcohol Use: No    Review of Systems  Constitutional: Negative for fever. Eyes: Negative for visual changes. ENT: Negative for sore throat. Cardiovascular: Negative for chest pain. Respiratory: Negative for shortness of breath. Gastrointestinal: As per history of present illness. Genitourinary: Negative for dysuria. Musculoskeletal: Negative for back pain. Skin: Negative for rash. Neurological: Positive for headache. 10 point Review of Systems otherwise negative ____________________________________________   PHYSICAL EXAM:  VITAL SIGNS: ED Triage Vitals  Enc Vitals Group     BP 10/04/15 1418 148/89 mmHg     Pulse Rate 10/04/15 1418 70     Resp 10/04/15 1418 20      Temp 10/04/15 1418 98 F (36.7 C)     Temp Source 10/04/15 1418 Oral  SpO2 10/04/15 1418 99 %     Weight 10/04/15 1418 206 lb (93.441 kg)     Height 10/04/15 1418 5\' 6"  (1.676 m)     Head Cir --      Peak Flow --      Pain Score 10/04/15 1421 8     Pain Loc --      Pain Edu? --      Excl. in [place]? --      Constitutional: Alert and oriented. In a significant amount of pain.[name] HEENT   Head: Normocephalic and atraumatic.      Eyes: Conjunctivae are normal. PERRL. Normal extraocular movements.      Ears:         Nose: No congestion/rhinnorhea.   Mouth/Throat: Mucous membranes are moist.   Neck: No stridor. Cardiovascular/Chest: Normal rate, regular rhythm.  No murmurs, rubs, or gallops. Respiratory: Normal respiratory effort without tachypnea nor retractions. Breath sounds are clear and equal bilaterally. No wheezes/rales/rhonchi. Gastrointestinal: Soft. No distention, no guarding, no rebound. Moderate tenderness right sided abdomen and right lower quadrant.  Genitourinary/rectal:No vaginal discharge or bleeding. Minimal discomfort with palpation on the cervix. No adnexal masses. Musculoskeletal: Nontender with normal range of motion in all extremities. No joint effusions.  No lower extremity tenderness.  No edema. Neurologic:  Normal speech and language. No gross or focal neurologic deficits are appreciated. Skin:  Skin is warm, dry and intact. No rash noted. Psychiatric: Mood and affect are normal. Speech and behavior are normal. Patient exhibits appropriate insight and judgment.  ____________________________________________   EKG I, Lisa Roca, MD, the attending physician have personally viewed and interpreted all ECGs.  None ____________________________________________  LABS (pertinent positives/negatives)  Lipase 19 Comprehensive metabolic panel without significant abnormality White blood count 7.7, hemoglobin 13.5 and platelet count 173 Urinalysis rare  bacteria, 0-5 red and white blood cells, negative for leukocytes, nitrites, and ketones Gonorrhea and Chlamydia are negative Wet prep clue cells present, positive for few white blood cells ____________________________________________  RADIOLOGY All Xrays were viewed by me. Imaging interpreted by Radiologist.  Renal ultrasound:  IMPRESSION: 1. Bilateral nonobstructing nephrolithiasis.  CT abdomen post with contrast: IMPRESSION: 1. Normal appendix 2. No acute findings are evident in the abdomen or pelvis. 3. Bilateral nephrolithiasis  Pelvic ultrasound: Pending  __________________________________________  PROCEDURES  Procedure(s) performed: None  Critical Care performed: None  ____________________________________________   ED COURSE / ASSESSMENT AND PLAN  Pertinent labs & imaging results that were available during my care of the patient were reviewed by me and considered in my medical decision making (see chart for details).   Patient's having right-sided flank and right lower quadrant pain which is waxing and waning, and feels similar to her to prior kidney stone. Somewhat less likely for kidney stones is the finding of moderate tenderness to palpation in the right lower quadrant on abdominal exam, and no specific urinary symptoms, and her urinalysis is also not showing evidence of a significant amount blood, nor significant evidence for UTI.  Denies GYN concerns.  After 1 mg Dilaudid, her pain was down to 7 out of 10, and she was given a second dose.  I discussed with her next step imaging of CT with contrast to rule out appendicitis, versus first obtain ultrasound renal looking for hydronephrosis as a likely source of her discomfort, and would avoid CT radiation.  Renal ultrasound shows bilateral nephrolithiasis, but no hydronephrosis.  Patient required numerous doses of pain medication, and due to persistent right lower quadrant pain,  I did discuss with her obtaining  CT of the abdomen. We discussed return benefit and chose to proceed.  CT abdomen showed no acute finding.  Patient was feeling significantly better, but I did discuss with her the other consideration would be pelvic source of right lower quadrant pain, and so I did a pelvic exam. She denies STD exposure, or prior STDs, or vaginal discharge. She does not really have any vaginal discharge or bleeding. She has a little bit of mild discomfort, and I discussed with her obtaining ultrasound for better evaluation of the ovary. She did this point, that she only has one ovary and it is on the right side.  Wet prep consistent with actual vaginosis. I will prescribe Flagyl for this. Gonorrhea and Chlamydia are pending, and I will hold off on any treatment given her clinical findings are not likely consistent with PID.  At this point I'm not entirely sure what is giving her the significant abdominal pain. She does report a history of migraine headaches, and perhaps some sort of abdominal migraine.   ----------------------------------------- 11:22 PM on 10/04/2015 -----------------------------------------  Patient was complaining of additional pain and she was given Toradol. She is still awaiting pelvic ultrasound is 0.9.  Patient care transferred to Dr. Dahlia Client at shift change 11:30 PM. Ultrasound is pending. If negative/reassuring, patient may be discharged with my prepared discharge instructions.    CONSULTATIONS:   None   Patient / Family / Caregiver informed of clinical course, medical decision-making process, and agree with plan.   I discussed return precautions, follow-up instructions, and discharged instructions with patient and/or family.   ___________________________________________   FINAL CLINICAL IMPRESSION(S) / ED DIAGNOSES   Final diagnoses:  RLQ abdominal pain  Bacterial vaginosis              Note: This dictation was prepared with Dragon dictation. Any  transcriptional errors that result from this process are unintentional   Lisa Roca, MD 10/04/15 [phone]

## 2015-10-04 NOTE — ED Notes (Addendum)
Pt reports lower abd pain since Friday. Nausea. No vomiting or diarrhea. Pt unable to sit still due to the pain. Denies urinary complaints.  Pt also reports Migraines top of head and behind left eye. Denies light disturbance. History of Migraine.

## 2015-10-05 DIAGNOSIS — E669 Obesity, unspecified: Secondary | ICD-10-CM | POA: Diagnosis not present

## 2015-10-05 DIAGNOSIS — E785 Hyperlipidemia, unspecified: Secondary | ICD-10-CM | POA: Diagnosis not present

## 2015-10-05 DIAGNOSIS — Z791 Long term (current) use of non-steroidal anti-inflammatories (NSAID): Secondary | ICD-10-CM | POA: Diagnosis not present

## 2015-10-05 DIAGNOSIS — R1031 Right lower quadrant pain: Secondary | ICD-10-CM | POA: Diagnosis not present

## 2015-10-05 DIAGNOSIS — J45909 Unspecified asthma, uncomplicated: Secondary | ICD-10-CM | POA: Diagnosis not present

## 2015-10-05 DIAGNOSIS — I10 Essential (primary) hypertension: Secondary | ICD-10-CM | POA: Diagnosis not present

## 2015-10-05 DIAGNOSIS — E119 Type 2 diabetes mellitus without complications: Secondary | ICD-10-CM | POA: Diagnosis not present

## 2015-10-05 DIAGNOSIS — N760 Acute vaginitis: Secondary | ICD-10-CM | POA: Diagnosis not present

## 2015-10-05 NOTE — ED Provider Notes (Signed)
-----------------------------------------   12:37 AM on 10/05/2015 -----------------------------------------   Blood pressure 131/83, pulse 80, temperature 98 F (36.7 C), temperature source Oral, resp. rate 10, height 5\' 6"  (1.676 m), weight 206 lb (93.441 kg), SpO2 96 %.  Assuming care from Dr. Reita Cliche.  In short, [name] is a 45 y.o. female with a chief complaint of Abdominal Pain .  Refer to the original H&P for additional details.  The current plan of care is to follow up the results of the patient's ultrasound.  Pelvic ultrasound: Normal right ovary with intact perfusion on Doppler analysis.  The patient has had a CT scan, a renal ultrasound and a pelvic ultrasound are all negative. At this point in time we are unsure of the cause of the patient's severe pain. She will be discharged home to follow-up with her primary care physician for further evaluation of her symptoms. Otherwise there is no other concerns. The patient's pain is improved from previous. The patient appears comfortable and has no further questions at this time.  Loney Hering, MD 10/05/15 [phone]

## 2015-10-15 ENCOUNTER — Encounter: Payer: Self-pay | Admitting: Obstetrics & Gynecology

## 2015-10-15 ENCOUNTER — Telehealth: Payer: Self-pay | Admitting: *Deleted

## 2015-10-15 DIAGNOSIS — N898 Other specified noninflammatory disorders of vagina: Secondary | ICD-10-CM

## 2015-10-15 MED ORDER — FLUCONAZOLE 150 MG PO TABS: 150.0000 mg | ORAL_TABLET | Freq: Once | ORAL | Status: DC

## 2015-10-15 NOTE — Telephone Encounter (Signed)
Received email that pt had some questions about BV and the treatment with Flagyl.  Called pt, no answer, left message to call the office.

## 2015-10-15 NOTE — Telephone Encounter (Signed)
Pt was given Flagyl for BV, states she is very sensitive to antibiotics and started having vaginal irritation and burning when she took the medication.  Sent rx for Diflucan to the pharmacy and instructed pt on medication use.  Pt also had a question about her [place] level which was drawn a few months ago in regards to her migraines and other symptoms she was experiencing.  Informed pt the Santiago Glad would be in the office tomorrow and I would discuss the results with her and call her back with her recommendations.  Pt acknowledged instructions.

## 2015-10-16 DIAGNOSIS — G4700 Insomnia, unspecified: Secondary | ICD-10-CM | POA: Insufficient documentation

## 2015-10-30 ENCOUNTER — Ambulatory Visit (INDEPENDENT_AMBULATORY_CARE_PROVIDER_SITE_OTHER): Payer: 59 | Admitting: Physician Assistant

## 2015-10-30 ENCOUNTER — Encounter: Payer: Self-pay | Admitting: Obstetrics & Gynecology

## 2015-10-30 ENCOUNTER — Ambulatory Visit (INDEPENDENT_AMBULATORY_CARE_PROVIDER_SITE_OTHER): Payer: 59 | Admitting: Obstetrics & Gynecology

## 2015-10-30 VITALS — BP 139/93 | HR 67 | Ht 66.0 in | Wt 209.0 lb

## 2015-10-30 VITALS — BP 139/63 | HR 67 | Ht 66.0 in | Wt 209.0 lb

## 2015-10-30 DIAGNOSIS — Z1231 Encounter for screening mammogram for malignant neoplasm of breast: Secondary | ICD-10-CM

## 2015-10-30 DIAGNOSIS — Z90721 Acquired absence of ovaries, unilateral: Secondary | ICD-10-CM

## 2015-10-30 DIAGNOSIS — G4459 Other complicated headache syndrome: Secondary | ICD-10-CM

## 2015-10-30 DIAGNOSIS — M62838 Other muscle spasm: Secondary | ICD-10-CM | POA: Diagnosis not present

## 2015-10-30 DIAGNOSIS — N898 Other specified noninflammatory disorders of vagina: Secondary | ICD-10-CM | POA: Diagnosis not present

## 2015-10-30 DIAGNOSIS — Z01419 Encounter for gynecological examination (general) (routine) without abnormal findings: Secondary | ICD-10-CM

## 2015-10-30 DIAGNOSIS — G4700 Insomnia, unspecified: Secondary | ICD-10-CM

## 2015-10-30 DIAGNOSIS — G43009 Migraine without aura, not intractable, without status migrainosus: Secondary | ICD-10-CM

## 2015-10-30 DIAGNOSIS — Z90710 Acquired absence of both cervix and uterus: Secondary | ICD-10-CM

## 2015-10-30 DIAGNOSIS — N951 Menopausal and female climacteric states: Secondary | ICD-10-CM | POA: Insufficient documentation

## 2015-10-30 DIAGNOSIS — N899 Noninflammatory disorder of vagina, unspecified: Secondary | ICD-10-CM | POA: Diagnosis not present

## 2015-10-30 DIAGNOSIS — Z9079 Acquired absence of other genital organ(s): Secondary | ICD-10-CM

## 2015-10-30 MED ORDER — ZONISAMIDE 100 MG PO CAPS: 600.0000 mg | ORAL_CAPSULE | Freq: Every day | ORAL | Status: DC

## 2015-10-30 MED ORDER — PROTRIPTYLINE HCL 10 MG PO TABS
10.0000 mg | ORAL_TABLET | Freq: Every day | ORAL | Status: DC
Start: 2015-10-30 — End: 2016-03-25

## 2015-10-30 NOTE — Progress Notes (Signed)
Patient ID: [name], female   DOB: [birth year], 45 y.o.   MRN: [id number] History:  LEGACY KUZNIK is a 45 y.o. [id number] who presents to clinic today for headache follow-up.  Today is an isolated day that she does not have a headache- which almost never happens.  Nonetheless, she states her headaches are unimproved.  At her last visit, she was started on amitriptyline.  She states that it caused significant sedation that lasted into the following day.  She is sensitive to medications and often has exaggerated effects.  She did not use it long enough to know if it helped her HAs.  She did not have an allergic reaction or any other negative effects.   She has recently been told she is post-menopausal.  She continues to have hot flashes.  She is choosing not to use medication to treat these at this time.   No other medical changes  HIT6:66 Number of days in the last 4 weeks with:  Severe headache: 14 Moderate headache: 12 Mild headache: 1  No headache: 1  Past Medical History  Diagnosis Date  . Diabetes mellitus 2011    diet controlled,  gestational  . Headache disorder   . Allergy   . Anemia   . Asthma     Social History   Social History  . Marital Status: Divorced    Spouse Name: N/A  . Number of Children: N/A  . Years of Education: N/A   Occupational History  . Not on file.   Social History Main Topics  . Smoking status: Never Smoker   . Smokeless tobacco: Never Used  . Alcohol Use: No  . Drug Use: No  . Sexual Activity:    Partners: Male    Birth Control/ Protection: Surgical   Other Topics Concern  . Not on file   Social History Narrative    Family History  Problem Relation Age of Onset  . Diabetes Mother   . Heart disease Mother   . Hypertension Mother   . Hyperlipidemia Mother   . Cancer Father 41    Lung Cancer  . Mental illness Sister     bipolar, substance abuse,  clean 2 yrs  . Hypertension Sister   . Diabetes Son   . Cancer Paternal  Grandmother 6    breast cancer    Allergies  Allergen Reactions  . Botox [Onabotulinumtoxina] Shortness Of Breath    syncope  . Kiwi Extract Shortness Of Breath  . Maxalt [Rizatriptan Benzoate] Anaphylaxis    Chest pain  . Nitrous Oxide Shortness Of Breath    syncope  . Triptans Shortness Of Breath  . Aspirin     Mouth blisters  . Hydrocodone-Acetaminophen Other (See Comments)  . Latex     Sometimes causes rash  . Rizatriptan Other (See Comments)  . Vicodin [Hydrocodone-Acetaminophen]     hallucinations  . Penicillins Rash and Other (See Comments)    Has patient had a PCN reaction causing immediate rash, facial/tongue/throat swelling, SOB or lightheadedness with hypotension: yes, RASH Has patient had a PCN reaction causing severe rash involving mucus membranes or skin necrosis: no Has patient had a PCN reaction that required hospitalization no Has patient had a PCN reaction occurring within the last 10 years: no If all of the above answers are "NO", then may proceed with Cephalosporin use.   . Tetracyclines & Related Rash    Current Outpatient Prescriptions on File Prior to Visit  Medication Sig Dispense Refill  .  albuterol (PROVENTIL HFA;VENTOLIN HFA) 108 (90 BASE) MCG/ACT inhaler Inhale 2 puffs into the lungs every 6 (six) hours as needed for wheezing or shortness of breath. One puff as needed 3.7 g 1  . ALPRAZolam (XANAX) 0.5 MG tablet Take 1 tablet (0.5 mg total) by mouth daily as needed for sleep. Take one by mouth as needed 30 tablet 1  . diazepam (VALIUM) 10 MG tablet Take 1 tablet (10 mg total) by mouth every 12 (twelve) hours as needed (muscle spasm). 30 tablet 1  . ketorolac (TORADOL) 10 MG tablet Take 1 tablet (10 mg total) by mouth every 6 (six) hours as needed. 20 tablet 0  . lisinopril (PRINIVIL,ZESTRIL) 10 MG tablet Take 2 tablets (20 mg total) by mouth daily. 180 tablet 3  . meclizine (ANTIVERT) 25 MG tablet Take 25 mg by mouth 3 (three) times daily as needed  for dizziness.    . meloxicam (MOBIC) 15 MG tablet Take 1 tablet (15 mg total) by mouth daily. 30 tablet 4  . metoprolol succinate (TOPROL-XL) 50 MG 24 hr tablet Take 1 tablet (50 mg total) by mouth daily. Take with or immediately following a meal. 90 tablet 1  . ondansetron (ZOFRAN) 4 MG tablet Take 1 tablet (4 mg total) by mouth every 8 (eight) hours as needed for nausea or vomiting. 10 tablet 0  . orphenadrine (NORFLEX) 100 MG tablet Take 1 tablet (100 mg total) by mouth 2 (two) times daily as needed. 60 tablet 1  . oxyCODONE-acetaminophen (PERCOCET/ROXICET) 5-325 MG per tablet Take 1 tablet by mouth every 6 (six) hours as needed. Take one by mouth as needed 30 tablet 0  . simvastatin (ZOCOR) 20 MG tablet Take 20 mg by mouth daily.    . traMADol (ULTRAM) 50 MG tablet Take 1 tablet (50 mg total) by mouth every 8 (eight) hours as needed. 30 tablet 3   No current facility-administered medications on file prior to visit.     Review of Systems:  All pertinent positive/negative included in HPI, all other review of systems are negative  Objective:  Physical Exam BP 139/63 mmHg  Pulse 67  Ht 5\' 6"  (1.676 m)  Wt 209 lb (94.802 kg)  BMI 33.75 kg/m2 CONSTITUTIONAL: Well-developed, well-nourished female in no acute distress.  EYES: EOM intact ENT: Normocephalic CARDIOVASCULAR: Regular rate and rhythm with no adventitious sounds. RESPIRATORY: Normal rate. Clear to auscultation bilaterally.  ENDOCRINE: Normal thyroid.  MUSCULOSKELETAL: Normal ROM, strength equal bilaterally, posterior cervical muscle spasm noted SKIN: Warm, dry without erythema  NEUROLOGICAL: Alert, oriented, CN II-XII grossly intact, Appropriate balance PSYCH: Normal behavior, mood   Assessment & Plan:  Assessment: 1. Migraine without aura and without status migrainosus, not intractable   2. Headache syndrome, complicated   3. Insomnia   4. Muscle spasm   No improvement in above conditions  Plan: Will begin Vivactil  for migraine prevention.  Pt having difficulty with alertness during the day and hoping this will also be helped by this medication.  Will use 10mg  q am.   Continue zonegran for prevention Reminded to use Norflex for significant muscle tension.  It will take time for this to bring lasting improvement.  Stretching and massage also advised May use melatonin in evening to help with insomnia.   Follow-up in 3 months or sooner PRN  Paticia Stack, PA-C 10/30/2015 9:42 AM

## 2015-10-30 NOTE — Progress Notes (Signed)
GYNECOLOGY CLINIC ANNUAL PREVENTATIVE CARE ENCOUNTER NOTE  Subjective:   [name] is a 45 y.o. [phone] female here for a routine annual gynecologic exam. She is s/p TAH/LSO for pelvic pain and AUB.  Current complaints: concerning menopausal symptoms.  Had recent [facility] of  57.5.  Also reports abnormal vaginal irritation, was recently treated with Diflucan but still has a little irritation.  Denies abnormal vaginal bleeding, pelvic pain, problems with intercourse or other gynecologic concerns.    Gynecologic History No LMP recorded. Patient has had a hysterectomy. Contraception: status post hysterectomy Last mammogram: 05/2014. Results were: normal  Obstetric History OB History  Gravida Para Term Preterm AB SAB TAB Ectopic Multiple Living  3 2 2  1   1  2     # Outcome Date GA Lbr Len/2nd Weight Sex Delivery Anes PTL Lv  3 Term           2 Term           1 Ectopic               Past Medical History  Diagnosis Date  . Diabetes mellitus 2011    diet controlled,  gestational  . Headache disorder   . Allergy   . Anemia   . Asthma     Past Surgical History  Procedure Laterality Date  . Hernia repair  2003    left inguinal   . Breast surgery      right breast x 2 , benign  . Tubal ligation    . Abdominal hysterectomy  2006    heavy menses, endometriosis, l oophrectomy  . Left oophorectomy      Current Outpatient Prescriptions on File Prior to Visit  Medication Sig Dispense Refill  . albuterol (PROVENTIL HFA;VENTOLIN HFA) 108 (90 BASE) MCG/ACT inhaler Inhale 2 puffs into the lungs every 6 (six) hours as needed for wheezing or shortness of breath. One puff as needed 3.7 g 1  . ALPRAZolam (XANAX) 0.5 MG tablet Take 1 tablet (0.5 mg total) by mouth daily as needed for sleep. Take one by mouth as needed 30 tablet 1  . amitriptyline (ELAVIL) 10 MG tablet Take 1 tablet (10 mg total) by mouth at bedtime. 30 tablet 2  . diazepam (VALIUM) 10 MG tablet Take 1 tablet (10 mg  total) by mouth every 12 (twelve) hours as needed (muscle spasm). 30 tablet 1  . fluconazole (DIFLUCAN) 150 MG tablet Take 1 tablet (150 mg total) by mouth once. Can take additional dose three days later if symptoms persist 2 tablet 0  . ketorolac (TORADOL) 10 MG tablet Take 1 tablet (10 mg total) by mouth every 6 (six) hours as needed. 20 tablet 0  . lisinopril (PRINIVIL,ZESTRIL) 10 MG tablet Take 2 tablets (20 mg total) by mouth daily. 180 tablet 3  . meclizine (ANTIVERT) 25 MG tablet Take 25 mg by mouth 3 (three) times daily as needed for dizziness.    . meloxicam (MOBIC) 15 MG tablet Take 1 tablet (15 mg total) by mouth daily. 30 tablet 4  . metoprolol succinate (TOPROL-XL) 50 MG 24 hr tablet Take 1 tablet (50 mg total) by mouth daily. Take with or immediately following a meal. 90 tablet 1  . metroNIDAZOLE (FLAGYL) 500 MG tablet Take 1 tablet (500 mg total) by mouth 2 (two) times daily. 20 tablet 0  . ondansetron (ZOFRAN) 4 MG tablet Take 1 tablet (4 mg total) by mouth every 8 (eight) hours as needed for  nausea or vomiting. 10 tablet 0  . ondansetron (ZOFRAN-ODT) 4 MG disintegrating tablet Take 4 mg by mouth every 8 (eight) hours as needed.    . orphenadrine (NORFLEX) 100 MG tablet Take 1 tablet (100 mg total) by mouth 2 (two) times daily as needed. 60 tablet 1  . oxyCODONE-acetaminophen (PERCOCET/ROXICET) 5-325 MG per tablet Take 1 tablet by mouth every 6 (six) hours as needed. Take one by mouth as needed 30 tablet 0  . simvastatin (ZOCOR) 20 MG tablet Take 20 mg by mouth daily.    . traMADol (ULTRAM) 50 MG tablet Take 1 tablet (50 mg total) by mouth every 8 (eight) hours as needed. 30 tablet 3  . zonisamide (ZONEGRAN) 100 MG capsule Take 6 capsules (600 mg total) by mouth daily. 180 capsule 2   No current facility-administered medications on file prior to visit.    Allergies  Allergen Reactions  . Botox [Onabotulinumtoxina] Shortness Of Breath    syncope  . Kiwi Extract Shortness Of  Breath  . Maxalt [Rizatriptan Benzoate] Anaphylaxis    Chest pain  . Nitrous Oxide Shortness Of Breath    syncope  . Triptans Shortness Of Breath  . Aspirin     Mouth blisters  . Hydrocodone-Acetaminophen Other (See Comments)  . Latex     Sometimes causes rash  . Rizatriptan Other (See Comments)  . Vicodin [Hydrocodone-Acetaminophen]     hallucinations  . Penicillins Rash and Other (See Comments)    Has patient had a PCN reaction causing immediate rash, facial/tongue/throat swelling, SOB or lightheadedness with hypotension: yes, RASH Has patient had a PCN reaction causing severe rash involving mucus membranes or skin necrosis: no Has patient had a PCN reaction that required hospitalization no Has patient had a PCN reaction occurring within the last 10 years: no If all of the above answers are "NO", then may proceed with Cephalosporin use.   . Tetracyclines & Related Rash    Social History   Social History  . Marital Status: Divorced    Spouse Name: N/A  . Number of Children: N/A  . Years of Education: N/A   Occupational History  . Not on file.   Social History Main Topics  . Smoking status: Never Smoker   . Smokeless tobacco: Never Used  . Alcohol Use: No  . Drug Use: No  . Sexual Activity:    Partners: Male    Birth Control/ Protection: Surgical   Other Topics Concern  . Not on file   Social History Narrative    Family History  Problem Relation Age of Onset  . Diabetes Mother   . Heart disease Mother   . Hypertension Mother   . Hyperlipidemia Mother   . Cancer Father 99    Lung Cancer  . Mental illness Sister     bipolar, substance abuse,  clean 2 yrs  . Hypertension Sister   . Diabetes Son   . Cancer Paternal Grandmother 6    breast cancer    The following portions of the patient's history were reviewed and updated as appropriate: allergies, current medications, past family history, past medical history, past social history, past surgical history  and problem list.  Review of Systems Pertinent items noted in HPI and remainder of comprehensive ROS otherwise negative.   Objective:  BP 139/93 mmHg  Pulse 67  Ht 5\' 6"  (1.676 m)  Wt 209 lb (94.802 kg)  BMI 33.75 kg/m2 CONSTITUTIONAL: Well-developed, well-nourished female in no acute distress.  HENT:  Normocephalic,  atraumatic, External right and left ear normal. Oropharynx is clear and moist EYES: Conjunctivae and EOM are normal. Pupils are equal, round, and reactive to light. No scleral icterus.  NECK: Normal range of motion, supple, no masses.  Normal thyroid.  SKIN: Skin is warm and dry. No rash noted. Not diaphoretic. No erythema. No pallor. NEUROLOGIC: Alert and oriented to person, place, and time. Normal reflexes, muscle tone coordination. No cranial nerve deficit noted. PSYCHIATRIC: Normal mood and affect. Normal behavior. Normal judgment and thought content. CARDIOVASCULAR: Normal heart rate noted, regular rhythm RESPIRATORY: Clear to auscultation bilaterally. Effort and breath sounds normal, no problems with respiration noted. BREASTS: Symmetric in size. No masses, skin changes, nipple drainage, or lymphadenopathy. ABDOMEN: Soft, normal bowel sounds, no distention noted.  No tenderness, rebound or guarding.  PELVIC: Normal appearing external genitalia; normal appearing vaginal mucosa and cervix cuff. No irritation noted on vulva or vagina. White discharge noted, sample obtained.  No adnexal tenderness. MUSCULOSKELETAL: Normal range of motion. No tenderness.  No cyanosis, clubbing, or edema.  2+ distal pulses.   Assessment:  Annual gynecologic examination Menopausal vasomotor symptoms Vaginal irritation   Plan:  Discussed hormonal and nonhormonal management treatments for vasomotor symptoms. Not a good candidate for hormones given h/o migraines, HTN and family history of stroke (aunt had stroke while on estrogen HRT).  Offered trial of Effexor vs Paxil vs Neurotin; patient  declined for now.  Will incorporate lifestyle modifications and let us know if she wants to proceed with other modalities. Will follow up results of wet prep and manage accordingly. Mammogram scheduled Routine preventative health maintenance measures emphasized. Please refer to After Visit Summary for other counseling recommendations.    Verita Schneiders, MD, [place] Attending Obstetrician & Gynecologist, [place] for [facility]

## 2015-10-30 NOTE — Patient Instructions (Signed)

## 2015-10-31 LAB — WET PREP BY MOLECULAR PROBE
Candida species: NEGATIVE
Gardnerella vaginalis: NEGATIVE
Trichomonas vaginosis: NEGATIVE

## 2015-11-10 ENCOUNTER — Encounter: Payer: Self-pay | Admitting: *Deleted

## 2015-11-10 ENCOUNTER — Telehealth: Payer: Self-pay | Admitting: *Deleted

## 2015-11-10 NOTE — Telephone Encounter (Signed)
FMLA completed.

## 2015-11-30 ENCOUNTER — Telehealth: Payer: Self-pay | Admitting: *Deleted

## 2015-11-30 DIAGNOSIS — N644 Mastodynia: Secondary | ICD-10-CM

## 2015-11-30 NOTE — Telephone Encounter (Signed)
Pt req referral to breast center.

## 2015-11-30 NOTE — Telephone Encounter (Signed)
-----   Message from Francia Greaves sent at 11/30/2015 11:04 AM EDT ----- Regarding: Referral Contact: [phone] Needs a referral to breast center in [place], for breast tenderness, they will no let her schedule this appt without the referral

## 2015-12-02 ENCOUNTER — Other Ambulatory Visit: Payer: Self-pay | Admitting: Obstetrics & Gynecology

## 2015-12-02 ENCOUNTER — Telehealth: Payer: Self-pay | Admitting: *Deleted

## 2015-12-02 DIAGNOSIS — N644 Mastodynia: Secondary | ICD-10-CM

## 2015-12-02 NOTE — Telephone Encounter (Signed)
Pt has appt at [facility] for Diagnostic Mammo and Rt Breast US due to breast pain and fullness on 12-07-15.  States the pain and fullness is getting worse and wanted to know if she could be seen sooner.  [place] and both locations could not see her until after July 3rd.  [place] recommended that the pt call daily to check for cancellations.  Pt acknowledged.

## 2015-12-07 ENCOUNTER — Other Ambulatory Visit: Payer: 59

## 2015-12-09 ENCOUNTER — Other Ambulatory Visit: Payer: Self-pay | Admitting: Obstetrics & Gynecology

## 2015-12-09 ENCOUNTER — Ambulatory Visit
Admission: RE | Admit: 2015-12-09 | Discharge: 2015-12-09 | Disposition: A | Discharge status: Home / Self Care | Payer: 59 | Source: Ambulatory Visit | Attending: Obstetrics & Gynecology | Admitting: Obstetrics & Gynecology

## 2015-12-09 DIAGNOSIS — N644 Mastodynia: Secondary | ICD-10-CM

## 2015-12-09 DIAGNOSIS — N63 Unspecified lump in breast: Secondary | ICD-10-CM | POA: Diagnosis not present

## 2015-12-09 DIAGNOSIS — N6310 Unspecified lump in the right breast, unspecified quadrant: Secondary | ICD-10-CM

## 2015-12-10 ENCOUNTER — Other Ambulatory Visit: Payer: Self-pay | Admitting: Obstetrics & Gynecology

## 2015-12-10 ENCOUNTER — Ambulatory Visit
Admission: RE | Admit: 2015-12-10 | Discharge: 2015-12-10 | Disposition: A | Discharge status: Home / Self Care | Payer: 59 | Source: Ambulatory Visit | Attending: Obstetrics & Gynecology | Admitting: Obstetrics & Gynecology

## 2015-12-10 DIAGNOSIS — N6310 Unspecified lump in the right breast, unspecified quadrant: Secondary | ICD-10-CM

## 2015-12-10 DIAGNOSIS — N6011 Diffuse cystic mastopathy of right breast: Secondary | ICD-10-CM | POA: Diagnosis not present

## 2016-01-22 ENCOUNTER — Ambulatory Visit: Payer: 59 | Admitting: Internal Medicine

## 2016-02-01 ENCOUNTER — Ambulatory Visit (INDEPENDENT_AMBULATORY_CARE_PROVIDER_SITE_OTHER): Payer: 59 | Admitting: *Deleted

## 2016-02-01 DIAGNOSIS — G43809 Other migraine, not intractable, without status migrainosus: Secondary | ICD-10-CM | POA: Diagnosis not present

## 2016-02-01 DIAGNOSIS — R519 Headache, unspecified: Secondary | ICD-10-CM

## 2016-02-01 DIAGNOSIS — R51 Headache: Principal | ICD-10-CM

## 2016-02-01 MED ORDER — KETOROLAC TROMETHAMINE 60 MG/2ML IM SOLN
60.0000 mg | Freq: Once | INTRAMUSCULAR | Status: AC
  Administered 2016-02-01: 60 mg via INTRAMUSCULAR

## 2016-02-01 NOTE — Progress Notes (Signed)
SUBJECTIVE: [name] is a 45 y.o. female who complains of headaches for 3 day(s). Description of pain: throbbing pain. Associated symptoms: nausea.   OBJECTIVE: Appearance: oriented to person, place, and time and ill-appearing.   ASSESSMENT: migraine  PLAN: Recommendations: lie in darkened room and apply cold packs prn for pain, side effect profile discussed in detail and asked to keep headache diary for headache specialist to review. See orders for this visit as documented in the electronic medical record.

## 2016-02-11 ENCOUNTER — Other Ambulatory Visit: Payer: Self-pay | Admitting: Internal Medicine

## 2016-02-16 ENCOUNTER — Encounter: Payer: Self-pay | Admitting: Physician Assistant

## 2016-02-16 ENCOUNTER — Ambulatory Visit (INDEPENDENT_AMBULATORY_CARE_PROVIDER_SITE_OTHER): Payer: 59 | Admitting: *Deleted

## 2016-02-16 ENCOUNTER — Telehealth: Payer: Self-pay | Admitting: *Deleted

## 2016-02-16 DIAGNOSIS — G43809 Other migraine, not intractable, without status migrainosus: Secondary | ICD-10-CM

## 2016-02-16 MED ORDER — KETOROLAC TROMETHAMINE 60 MG/2ML IM SOLN
60.0000 mg | Freq: Once | INTRAMUSCULAR | Status: AC
  Administered 2016-02-16: 60 mg via INTRAMUSCULAR

## 2016-02-16 MED ORDER — KETOROLAC TROMETHAMINE 10 MG PO TABS: 10.0000 mg | ORAL_TABLET | Freq: Four times a day (QID) | ORAL | 1 refills | Status: DC | PRN

## 2016-02-16 NOTE — Telephone Encounter (Signed)
Sent Toradol po to pharm.

## 2016-02-16 NOTE — Progress Notes (Signed)
SUBJECTIVE:  [name] is a 45 y.o. female who complains of migraine headache for 3 day(s). She has a well established history of recurrent migraines.  Description of pain: throbbing pain. Associated symptoms: facial pain, light sensitivity, nausea and visual disturbance. Patient has already taken OTC medications for this headache without relief.  Current Outpatient Prescriptions  Medication Sig Dispense Refill  . albuterol (PROVENTIL HFA;VENTOLIN HFA) 108 (90 BASE) MCG/ACT inhaler Inhale 2 puffs into the lungs every 6 (six) hours as needed for wheezing or shortness of breath. One puff as needed 3.7 g 1  . ALPRAZolam (XANAX) 0.5 MG tablet Take 1 tablet (0.5 mg total) by mouth daily as needed for sleep. Take one by mouth as needed 30 tablet 1  . diazepam (VALIUM) 10 MG tablet Take 1 tablet (10 mg total) by mouth every 12 (twelve) hours as needed (muscle spasm). 30 tablet 1  . ketorolac (TORADOL) 10 MG tablet Take 1 tablet (10 mg total) by mouth every 6 (six) hours as needed. 30 tablet 1  . lisinopril (PRINIVIL,ZESTRIL) 10 MG tablet Take 2 tablets (20 mg total) by mouth daily. 180 tablet 3  . meclizine (ANTIVERT) 25 MG tablet Take 25 mg by mouth 3 (three) times daily as needed for dizziness.    . meloxicam (MOBIC) 15 MG tablet Take 1 tablet (15 mg total) by mouth daily. 30 tablet 4  . metoprolol succinate (TOPROL-XL) 50 MG 24 hr tablet TAKE 1 TABLET BY MOUTH DAILY. TAKE WITH OR IMMEDIATELY FOLLOWING A MEAL. 90 tablet 1  . ondansetron (ZOFRAN) 4 MG tablet Take 1 tablet (4 mg total) by mouth every 8 (eight) hours as needed for nausea or vomiting. 10 tablet 0  . orphenadrine (NORFLEX) 100 MG tablet Take 1 tablet (100 mg total) by mouth 2 (two) times daily as needed. 60 tablet 1  . oxyCODONE-acetaminophen (PERCOCET/ROXICET) 5-325 MG per tablet Take 1 tablet by mouth every 6 (six) hours as needed. Take one by mouth as needed 30 tablet 0  . protriptyline (VIVACTIL) 10 MG tablet Take 1 tablet (10  mg total) by mouth at bedtime. 30 tablet 2  . simvastatin (ZOCOR) 20 MG tablet Take 20 mg by mouth daily.    . traMADol (ULTRAM) 50 MG tablet Take 1 tablet (50 mg total) by mouth every 8 (eight) hours as needed. 30 tablet 3  . zonisamide (ZONEGRAN) 100 MG capsule Take 6 capsules (600 mg total) by mouth daily. 180 capsule 2   No current facility-administered medications for this visit.     There are no associated abnormal neurological symptoms such as TIA's, loss of balance, loss of vision or speech, numbness or weakness on review. Past neurological history: negative for stroke, MS, epilepsy, or brain tumor.   OBJECTIVE:  Patient appears in pain, preferring to lie in a darkened room.  Alert and oriented x 3.    ASSESSMENT:  Migraine headache  PLAN:  Treatment today per Dr. Harolyn Rutherford -  Toradol 60mg  injection Will send Toradol tabs to pharmacy Appointment made with headache specialist Allie Dimmer, PA ROV prn if pain does not resolve after treatment.

## 2016-03-25 ENCOUNTER — Encounter: Payer: Self-pay | Admitting: Physician Assistant

## 2016-03-25 ENCOUNTER — Ambulatory Visit (INDEPENDENT_AMBULATORY_CARE_PROVIDER_SITE_OTHER): Payer: 59 | Admitting: Physician Assistant

## 2016-03-25 VITALS — BP 158/88 | HR 62 | Ht 66.0 in | Wt 221.0 lb

## 2016-03-25 DIAGNOSIS — G4459 Other complicated headache syndrome: Secondary | ICD-10-CM | POA: Diagnosis not present

## 2016-03-25 DIAGNOSIS — M62838 Other muscle spasm: Secondary | ICD-10-CM | POA: Diagnosis not present

## 2016-03-25 DIAGNOSIS — G43009 Migraine without aura, not intractable, without status migrainosus: Secondary | ICD-10-CM

## 2016-03-25 MED ORDER — PROTRIPTYLINE HCL 10 MG PO TABS: 10.0000 mg | ORAL_TABLET | Freq: Every day | ORAL | 2 refills | Status: DC

## 2016-03-25 NOTE — Progress Notes (Signed)
History:  [name] is a 45 y.o. [id number] who presents to clinic today for HA followup.  She did not get her protriptyline rx prescribed last visit and therefore is uncertain if this is helpful.  She also was out of her zonegran for 2 weeks.  She noticed no worsening of HA during that time and decided not to fill the rx.  HA's are unchanged.  She had a reaction to her flu shot (swelling/increased work of breathing/tenderness) improved with benadryl.  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  No other medical changes.  HIT6:68 Number of days in the last 4 weeks with:  Severe headache: 10 Moderate headache: 12 Mild headache: 6  No headache: 0   Past Medical History:  Diagnosis Date  . Allergy   . Anemia   . Asthma   . Diabetes mellitus 2011   diet controlled,  gestational  . Headache disorder     Social History   Social History  . Marital status: Divorced    Spouse name: N/A  . Number of children: N/A  . Years of education: N/A   Occupational History  . Not on file.   Social History Main Topics  . Smoking status: Never Smoker  . Smokeless tobacco: Never Used  . Alcohol use No  . Drug use: No  . Sexual activity: Not Currently    Partners: Male    Birth control/ protection: Surgical   Other Topics Concern  . Not on file   Social History Narrative  . No narrative on file    Family History  Problem Relation Age of Onset  . Diabetes Mother   . Heart disease Mother   . Hypertension Mother   . Hyperlipidemia Mother   . Cancer Father 22    Lung Cancer  . Mental illness Sister     bipolar, substance abuse,  clean 2 yrs  . Hypertension Sister   . Cancer Paternal Grandmother 23    breast cancer  . Diabetes Son       Current Outpatient Prescriptions on File Prior to Visit  Medication Sig Dispense Refill  . lisinopril (PRINIVIL,ZESTRIL) 10 MG tablet Take 2 tablets (20 mg total) by mouth daily. 180 tablet 3  . metoprolol succinate (TOPROL-XL) 50 MG 24 hr  tablet TAKE 1 TABLET BY MOUTH DAILY. TAKE WITH OR IMMEDIATELY FOLLOWING A MEAL. 90 tablet 1  . albuterol (PROVENTIL HFA;VENTOLIN HFA) 108 (90 BASE) MCG/ACT inhaler Inhale 2 puffs into the lungs every 6 (six) hours as needed for wheezing or shortness of breath. One puff as needed (Patient not taking: Reported on 03/25/2016) 3.7 g 1  . ALPRAZolam (XANAX) 0.5 MG tablet Take 1 tablet (0.5 mg total) by mouth daily as needed for sleep. Take one by mouth as needed (Patient not taking: Reported on 03/25/2016) 30 tablet 1  . diazepam (VALIUM) 10 MG tablet Take 1 tablet (10 mg total) by mouth every 12 (twelve) hours as needed (muscle spasm). (Patient not taking: Reported on 03/25/2016) 30 tablet 1  . ketorolac (TORADOL) 10 MG tablet Take 1 tablet (10 mg total) by mouth every 6 (six) hours as needed. (Patient not taking: Reported on 03/25/2016) 30 tablet 1  . meclizine (ANTIVERT) 25 MG tablet Take 25 mg by mouth 3 (three) times daily as needed for dizziness.    . meloxicam (MOBIC) 15 MG tablet Take 1 tablet (15 mg total) by mouth daily. (Patient not taking: Reported on 03/25/2016) 30 tablet 4  . ondansetron (  ZOFRAN) 4 MG tablet Take 1 tablet (4 mg total) by mouth every 8 (eight) hours as needed for nausea or vomiting. (Patient not taking: Reported on 03/25/2016) 10 tablet 0  . orphenadrine (NORFLEX) 100 MG tablet Take 1 tablet (100 mg total) by mouth 2 (two) times daily as needed. (Patient not taking: Reported on 03/25/2016) 60 tablet 1  . oxyCODONE-acetaminophen (PERCOCET/ROXICET) 5-325 MG per tablet Take 1 tablet by mouth every 6 (six) hours as needed. Take one by mouth as needed (Patient not taking: Reported on 03/25/2016) 30 tablet 0  . simvastatin (ZOCOR) 20 MG tablet Take 20 mg by mouth daily.    . traMADol (ULTRAM) 50 MG tablet Take 1 tablet (50 mg total) by mouth every 8 (eight) hours as needed. (Patient not taking: Reported on 03/25/2016) 30 tablet 3  . zonisamide (ZONEGRAN) 100 MG capsule Take 6 capsules  (600 mg total) by mouth daily. (Patient not taking: Reported on 03/25/2016) 180 capsule 2   No current facility-administered medications on file prior to visit.      Review of Systems:  All pertinent positive/negative included in HPI, all other review of systems are negative  Objective:  Physical Exam BP (!) 158/88   Pulse 62   Ht 5\' 6"  (1.676 m)   Wt 221 lb (100.2 kg)   BMI 35.67 kg/m  CONSTITUTIONAL: Well-developed, well-nourished female in no acute distress.  EYES: EOM intact ENT: Normocephalic CARDIOVASCULAR: Regular rate and rhythm with no adventitious sounds.  RESPIRATORY: Normal rate. Clear to auscultation bilaterally. Some swelling of ankles MUSCULOSKELETAL: Normal ROM, strength equal bilaterally, muscle spasm noted SKIN: Warm, dry without erythema  NEUROLOGICAL: Alert, oriented, CN II-XII grossly intact, Appropriate balance, PSYCH: Normal behavior, mood   Assessment & Plan:  Assessment: 1. Migraine without aura and without status migrainosus, not intractable   2. Headache syndrome, complicated   3. Muscle spasm    No improvement in above diagnoses  Plan: Will plan on no further zonegran Pt to trial Vivactil.  Will stay with lowest dose as pt is extremely sensitive to medications.   Pt strongly encouraged to exercise daily.  Walk outside TODAY as she has free time.   Follow-up in 3 months or sooner PRN  Paticia Stack, PA-C 03/25/2016 12:17 PM

## 2016-03-25 NOTE — Patient Instructions (Signed)

## 2016-03-28 ENCOUNTER — Ambulatory Visit: Payer: Self-pay | Admitting: Internal Medicine

## 2016-04-21 ENCOUNTER — Encounter: Payer: Self-pay | Admitting: Internal Medicine

## 2016-05-10 DIAGNOSIS — H5203 Hypermetropia, bilateral: Secondary | ICD-10-CM | POA: Diagnosis not present

## 2016-05-10 LAB — HM DIABETES EYE EXAM

## 2016-05-17 ENCOUNTER — Encounter: Payer: Self-pay | Admitting: *Deleted

## 2016-05-17 ENCOUNTER — Ambulatory Visit (INDEPENDENT_AMBULATORY_CARE_PROVIDER_SITE_OTHER): Payer: 59 | Admitting: *Deleted

## 2016-05-17 DIAGNOSIS — G43009 Migraine without aura, not intractable, without status migrainosus: Secondary | ICD-10-CM | POA: Diagnosis not present

## 2016-05-17 MED ORDER — KETOROLAC TROMETHAMINE 60 MG/2ML IM SOLN
60.0000 mg | Freq: Once | INTRAMUSCULAR | Status: AC
  Administered 2016-05-17: 60 mg via INTRAMUSCULAR

## 2016-05-17 NOTE — Progress Notes (Signed)
Pt here today for Toradol 60mg  injection due to acute migraine headache.  Toradol 60mg  given.

## 2016-05-18 ENCOUNTER — Encounter: Payer: Self-pay | Admitting: *Deleted

## 2016-05-18 ENCOUNTER — Ambulatory Visit: Payer: Self-pay | Admitting: Internal Medicine

## 2016-05-18 ENCOUNTER — Ambulatory Visit (INDEPENDENT_AMBULATORY_CARE_PROVIDER_SITE_OTHER): Payer: 59 | Admitting: Internal Medicine

## 2016-05-18 ENCOUNTER — Encounter: Payer: Self-pay | Admitting: Internal Medicine

## 2016-05-18 VITALS — BP 136/90 | HR 97 | Temp 97.9°F | Resp 12 | Ht 66.0 in | Wt 222.0 lb

## 2016-05-18 DIAGNOSIS — E1121 Type 2 diabetes mellitus with diabetic nephropathy: Secondary | ICD-10-CM

## 2016-05-18 DIAGNOSIS — I10 Essential (primary) hypertension: Secondary | ICD-10-CM

## 2016-05-18 DIAGNOSIS — E1165 Type 2 diabetes mellitus with hyperglycemia: Secondary | ICD-10-CM

## 2016-05-18 DIAGNOSIS — E785 Hyperlipidemia, unspecified: Secondary | ICD-10-CM

## 2016-05-18 DIAGNOSIS — N6011 Diffuse cystic mastopathy of right breast: Secondary | ICD-10-CM

## 2016-05-18 DIAGNOSIS — E7800 Pure hypercholesterolemia, unspecified: Secondary | ICD-10-CM

## 2016-05-18 DIAGNOSIS — E1169 Type 2 diabetes mellitus with other specified complication: Secondary | ICD-10-CM

## 2016-05-18 DIAGNOSIS — Z91018 Allergy to other foods: Secondary | ICD-10-CM

## 2016-05-18 DIAGNOSIS — N6310 Unspecified lump in the right breast, unspecified quadrant: Secondary | ICD-10-CM

## 2016-05-18 DIAGNOSIS — IMO0002 Reserved for concepts with insufficient information to code with codable children: Secondary | ICD-10-CM

## 2016-05-18 MED ORDER — MONTELUKAST SODIUM 10 MG PO TABS: 10.0000 mg | ORAL_TABLET | Freq: Every day | ORAL | 3 refills | Status: DC

## 2016-05-18 MED ORDER — HYDROCHLOROTHIAZIDE 25 MG PO TABS: 25.0000 mg | ORAL_TABLET | Freq: Every day | ORAL | 1 refills | Status: DC

## 2016-05-18 NOTE — Progress Notes (Signed)
Subjective:  Patient ID: [name], female    DOB: 09-21-1970  Age: 45 y.o. MRN: [id number]  CC: The primary encounter diagnosis was Breast mass, right. Diagnoses of Controlled type 2 diabetes mellitus with diabetic nephropathy, without long-term current use of insulin ([place]), Allergy to food, Pure hypercholesterolemia, Fibrocystic breast changes, right, Uncontrolled type 2 diabetes mellitus with diabetic nephropathy, without long-term current use of insulin ([place]), Hypertension goal BP (blood pressure) < 130/80, and Hyperlipidemia associated with type 2 diabetes mellitus ([place]) were also pertinent to this visit.  HPI [name] presents for follow up on type 2 dm, obesity and  hypertension .  Last visit feb  2017.    Hypertension: patient checks blood pressure twice weekly at home.  Readings have been for the most part > 140/80 at rest . Patient is not  following a reduce salt diet most days and is taking medications as prescribed.  No weight loss .she is working 4 part time jobs  Eats on the run,  Has a daughter in [place].  Uses spironolactone  4/month  For edema.    Type 2 dm: des not check sugars or exercise.  No numbness,  Vision changes.   Diet reviewed:  Fast food: fried  Fish,  Occasional sodas.  Drinking Premier boost glucose control shakes.  Cheese stick and ritz crackers. .  Baked chicken and sweet potatoes and broccoli.   Menopausal .  Hot all the time , no mood swings   Right upper arm went numb immediately after receiving the flu vaccine followed by swelling.   ,. Then developed chest tightness bilaterally,  Took benadryl for 48 hours.    Allergies new food allergies developed this summer:: mango and kiwi, had angioedema but did not have to use epi  Pen   Breast mass:  Had an abnormal mammogram resulting in biopsy of her right  breast in July: benign.     Lab Results  Component Value Date   HGBA1C 8.4 (H) 05/18/2016     Outpatient Medications Prior  to Visit  Medication Sig Dispense Refill  . albuterol (PROVENTIL HFA;VENTOLIN HFA) 108 (90 BASE) MCG/ACT inhaler Inhale 2 puffs into the lungs every 6 (six) hours as needed for wheezing or shortness of breath. One puff as needed 3.7 g 1  . diazepam (VALIUM) 10 MG tablet Take 1 tablet (10 mg total) by mouth every 12 (twelve) hours as needed (muscle spasm). 30 tablet 1  . ketorolac (TORADOL) 10 MG tablet Take 1 tablet (10 mg total) by mouth every 6 (six) hours as needed. 30 tablet 1  . lisinopril (PRINIVIL,ZESTRIL) 10 MG tablet Take 2 tablets (20 mg total) by mouth daily. 180 tablet 3  . meclizine (ANTIVERT) 25 MG tablet Take 25 mg by mouth 3 (three) times daily as needed for dizziness.    . meloxicam (MOBIC) 15 MG tablet Take 1 tablet (15 mg total) by mouth daily. 30 tablet 4  . metoprolol succinate (TOPROL-XL) 50 MG 24 hr tablet TAKE 1 TABLET BY MOUTH DAILY. TAKE WITH OR IMMEDIATELY FOLLOWING A MEAL. 90 tablet 1  . ondansetron (ZOFRAN) 4 MG tablet Take 1 tablet (4 mg total) by mouth every 8 (eight) hours as needed for nausea or vomiting. 10 tablet 0  . orphenadrine (NORFLEX) 100 MG tablet Take 1 tablet (100 mg total) by mouth 2 (two) times daily as needed. 60 tablet 1  . protriptyline (VIVACTIL) 10 MG tablet Take 1 tablet (10 mg total) by mouth  at bedtime. 30 tablet 2  . traMADol (ULTRAM) 50 MG tablet Take 1 tablet (50 mg total) by mouth every 8 (eight) hours as needed. 30 tablet 3  . simvastatin (ZOCOR) 20 MG tablet Take 20 mg by mouth daily.    [name] ALPRAZolam (XANAX) 0.5 MG tablet Take 1 tablet (0.5 mg total) by mouth daily as needed for sleep. Take one by mouth as needed (Patient not taking: Reported on 05/18/2016) 30 tablet 1  . oxyCODONE-acetaminophen (PERCOCET/ROXICET) 5-325 MG per tablet Take 1 tablet by mouth every 6 (six) hours as needed. Take one by mouth as needed (Patient not taking: Reported on 03/25/2016) 30 tablet 0  . zonisamide (ZONEGRAN) 100 MG capsule Take 6 capsules (600 mg total)  by mouth daily. (Patient not taking: Reported on 05/18/2016) 180 capsule 2   No facility-administered medications prior to visit.     Review of Systems;  Patient denies headache, fevers, malaise, unintentional weight loss, skin rash, eye pain, sinus congestion and sinus pain, sore throat, dysphagia,  hemoptysis , cough, dyspnea, wheezing, chest pain, palpitations, orthopnea, edema, abdominal pain, nausea, melena, diarrhea, constipation, flank pain, dysuria, hematuria, urinary  Frequency, nocturia,, tingling, seizures,  Focal weakness, Loss of consciousness,  Tremor, insomnia, depression, anxiety, and suicidal ideation.      Objective:  BP 136/90   Pulse 97   Temp 97.9 F (36.6 C) (Oral)   Resp 12   Ht '5\' 6"'$  (1.676 m)   Wt 222 lb (100.7 kg)   SpO2 98%   BMI 35.83 kg/m   BP Readings from Last 3 Encounters:  05/18/16 136/90  03/25/16 (!) 158/88  10/30/15 139/63    Wt Readings from Last 3 Encounters:  05/18/16 222 lb (100.7 kg)  03/25/16 221 lb (100.2 kg)  10/30/15 209 lb (94.8 kg)    General appearance: alert, cooperative and appears stated age Ears: normal TM's and external ear canals both ears Throat: lips, mucosa, and tongue normal; teeth and gums normal Neck: no adenopathy, no carotid bruit, supple, symmetrical, trachea midline and thyroid not enlarged, symmetric, no tenderness/mass/nodules Back: symmetric, no curvature. ROM normal. No CVA tenderness. Lungs: clear to auscultation bilaterally Heart: regular rate and rhythm, S1, S2 normal, no murmur, click, rub or gallop Abdomen: soft, non-tender; bowel sounds normal; no masses,  no organomegaly Pulses: 2+ and symmetric Skin: Skin color, texture, turgor normal. No rashes or lesions Lymph nodes: Cervical, supraclavicular, and axillary nodes normal.  Lab Results  Component Value Date   HGBA1C 8.4 (H) 05/18/2016   HGBA1C 6.6 (H) 07/24/2015   HGBA1C 6.7 (H) 01/19/2015    Lab Results  Component Value Date    CREATININE 0.96 05/18/2016   CREATININE 0.99 10/04/2015   CREATININE 1.02 07/24/2015    Lab Results  Component Value Date   WBC 7.7 10/04/2015   HGB 13.5 10/04/2015   HCT 39.8 10/04/2015   PLT 173 10/04/2015   GLUCOSE 181 (H) 05/18/2016   CHOL 186 05/18/2016   TRIG 107.0 05/18/2016   HDL 37.30 (L) 05/18/2016   LDLDIRECT 137.0 05/18/2016   LDLCALC 127 (H) 05/18/2016   ALT 18 05/18/2016   AST 18 05/18/2016   NA 138 05/18/2016   K 4.0 05/18/2016   CL 102 05/18/2016   CREATININE 0.96 05/18/2016   BUN 14 05/18/2016   CO2 33 (H) 05/18/2016   TSH 0.619 10/16/2014   HGBA1C 8.4 (H) 05/18/2016   MICROALBUR 2.4 (H) 05/18/2016    Mm Digital Diagnostic Unilat R  Result Date: 12/10/2015 CLINICAL  DATA:  Status post ultrasound-guided core needle biopsy of right breast 5 o'clock cystic mass. EXAM: DIAGNOSTIC RIGHT MAMMOGRAM POST ULTRASOUND BIOPSY COMPARISON:  Previous exam(s). FINDINGS: Mammographic images were obtained following ultrasound guided biopsy of right breast 5 o'clock mass. Two-view mammography demonstrates presence of ribbon shaped tissue marker within the biopsy site in the lower inner quadrant of the right breast, in satisfactory position. IMPRESSION: Successful post biopsy tissue marker placement post ultrasound-guided core needle biopsy of the right breast. Final Assessment: Post Procedure Mammograms for Marker Placement Electronically Signed   By: Fidela Salisbury M.D.   On: 12/10/2015 14:53   [place] Rt Breast Bx W Loc Dev 1st Lesion Img Bx Spec US Guide  Addendum Date: 12/11/2015   ADDENDUM REPORT: 12/11/2015 13:35 ADDENDUM: Pathology revealed FIBROCYSTIC CHANGES WITH APOCRINE METAPLASIA AND PAPILLARY HYPERPLASIA of the Right breast at the 5:00 o'clock location. This was found to be concordant by Dr. Fidela Salisbury. Pathology results were discussed with the patient by telephone. The patient reported doing well after the biopsy with tenderness at the site. Post biopsy  instructions and care were reviewed and questions were answered. The patient was encouraged to call [facility] for any additional concerns. The patient was asked to return for right diagnostic mammography and ultrasound in 6 months and informed a reminder notice would be sent regarding this appointment. Pathology results reported by Terie Purser, RN on 12/11/2015. Electronically Signed   By: Fidela Salisbury M.D.   On: 12/11/2015 13:35  Result Date: 12/11/2015 CLINICAL DATA:  Right breast 5 o'clock cystic mass. EXAM: ULTRASOUND GUIDED RIGHT BREAST CORE NEEDLE BIOPSY COMPARISON:  Previous exam(s). FINDINGS: I met with the patient and we discussed the procedure of ultrasound-guided biopsy, including benefits and alternatives. We discussed the high likelihood of a successful procedure. We discussed the risks of the procedure, including infection, bleeding, tissue injury, clip migration, and inadequate sampling. Informed written consent was given. The usual time-out protocol was performed immediately prior to the procedure. Using sterile technique and 1% Lidocaine as local anesthetic, under direct ultrasound visualization, a 14 gauge spring-loaded device was used to perform biopsy of right breast 5 o'clock cystic mass using a medial approach. At the conclusion of the procedure a ribbon shaped tissue marker clip was deployed into the biopsy cavity. Follow up 2 view mammogram was performed and dictated separately. IMPRESSION: Ultrasound guided biopsy of right breast 5 o'clock cystic mass. No apparent complications. Electronically Signed: By: Fidela Salisbury M.D. On: 12/10/2015 14:52    Assessment & Plan:   Problem List Items Addressed This Visit    Fibrocystic breast changes, right - Primary    By biopsy of right breast July 2017      Hyperlipidemia associated with type 2 diabetes mellitus ([place])    New ACC guidelines recommend starting patients aged 86 or higher on moderate  intensity statin therapy for diabetes and concurrent LDL between 70-189.Again recommended trial of simvastatin.   Lab Results  Component Value Date   CHOL 186 05/18/2016   HDL 37.30 (L) 05/18/2016   LDLCALC 127 (H) 05/18/2016   LDLDIRECT 137.0 05/18/2016   TRIG 107.0 05/18/2016   CHOLHDL 5 05/18/2016             Relevant Medications   hydrochlorothiazide (HYDRODIURIL) 25 MG tablet   metFORMIN (GLUCOPHAGE) 500 MG tablet   Hypertension goal BP (blood pressure) < 130/80    Not at goal ,  Adding hctz to lisinopril 20 mg   Lab  Results  Component Value Date   CREATININE 0.96 05/18/2016   Lab Results  Component Value Date   NA 138 05/18/2016   K 4.0 05/18/2016   CL 102 05/18/2016   CO2 33 (H) 05/18/2016         Relevant Medications   hydrochlorothiazide (HYDRODIURIL) 25 MG tablet   Uncontrolled type 2 diabetes mellitus ([facility])    Loss of control noted  on diet alone .  hemoglobin A1c now 8.4,.   Will recommend starting metformin 500 mg bid and rtc one month  Lab Results  Component Value Date   HGBA1C 8.4 (H) 05/18/2016   Lab Results  Component Value Date   MICROALBUR 2.4 (H) 05/18/2016    Patient is up-to-date on eye exams and foot exam was done today.  There is  no proteinuria on today's micro urinalysis .               Relevant Medications   metFORMIN (GLUCOPHAGE) 500 MG tablet    Other Visit Diagnoses    Allergy to food       Relevant Orders   Alpha-Gal Panel (Completed)   Ambulatory referral to Allergy   Pure hypercholesterolemia       Relevant Medications   hydrochlorothiazide (HYDRODIURIL) 25 MG tablet   Other Relevant Orders   LDL cholesterol, direct (Completed)      I have discontinued Ms. Hanning's ALPRAZolam, oxyCODONE-acetaminophen, and zonisamide. I am also having her start on hydrochlorothiazide, montelukast, and metFORMIN. Additionally, I am having her maintain her meclizine, traMADol, meloxicam, albuterol, diazepam, orphenadrine,  simvastatin, lisinopril, ondansetron, metoprolol succinate, ketorolac, and protriptyline.  Meds ordered this encounter  Medications  . hydrochlorothiazide (HYDRODIURIL) 25 MG tablet    Sig: Take 1 tablet (25 mg total) by mouth daily.    Dispense:  30 tablet    Refill:  1  . montelukast (SINGULAIR) 10 MG tablet    Sig: Take 1 tablet (10 mg total) by mouth at bedtime.    Dispense:  30 tablet    Refill:  3  . metFORMIN (GLUCOPHAGE) 500 MG tablet    Sig: Take 1 tablet (500 mg total) by mouth 2 (two) times daily with a meal.    Dispense:  180 tablet    Refill:  3    Medications Discontinued During This Encounter  Medication Reason  . ALPRAZolam (XANAX) 0.5 MG tablet Change in therapy  . oxyCODONE-acetaminophen (PERCOCET/ROXICET) 5-325 MG per tablet Change in therapy  . zonisamide (ZONEGRAN) 100 MG capsule Change in therapy    Follow-up: Return in about 3 months (around 08/16/2016) for follow up diabetes.   Crecencio Mc, MD

## 2016-05-18 NOTE — Patient Instructions (Addendum)
[email or web address] for compression knee highs    Your blood pressure is elevated today:  Continue 20  mg lisinopril daily    Add hctz mild diuretic And recheck BP one week.  If not at goal of 120/70,  Let me know   Consider daily use of zyrtec,  Allegra or claritin PLUS DAILY singulair   I am referring you for allergy testing

## 2016-05-18 NOTE — Progress Notes (Signed)
Pre-visit discussion using our clinic review tool. No additional management support is needed unless otherwise documented below in the visit note.  

## 2016-05-19 LAB — COMPREHENSIVE METABOLIC PANEL
ALT: 18 U/L (ref 0–35)
AST: 18 U/L (ref 0–37)
Albumin: 4.4 g/dL (ref 3.5–5.2)
Alkaline Phosphatase: 78 U/L (ref 39–117)
BUN: 14 mg/dL (ref 6–23)
CO2: 33 mEq/L — ABNORMAL HIGH (ref 19–32)
Calcium: 9.8 mg/dL (ref 8.4–10.5)
Chloride: 102 mEq/L (ref 96–112)
Creatinine, Ser: 0.96 mg/dL (ref 0.40–1.20)
GFR: 80.18 mL/min (ref >60.00)
Glucose, Bld: 181 mg/dL — ABNORMAL HIGH (ref 70–99)
Potassium: 4 mEq/L (ref 3.5–5.1)
Sodium: 138 mEq/L (ref 135–145)
Total Bilirubin: 0.4 mg/dL (ref 0.2–1.2)
Total Protein: 7.3 g/dL (ref 6.0–8.3)

## 2016-05-19 LAB — LDL CHOLESTEROL, DIRECT: Direct LDL: 137 mg/dL

## 2016-05-19 LAB — LIPID PANEL
Cholesterol: 186 mg/dL (ref 0–200)
HDL: 37.3 mg/dL — ABNORMAL LOW (ref >39.00)
LDL Cholesterol: 127 mg/dL — ABNORMAL HIGH (ref 0–99)
NonHDL: 148.24
Total CHOL/HDL Ratio: 5
Triglycerides: 107 mg/dL (ref 0.0–149.0)
VLDL: 21.4 mg/dL (ref 0.0–40.0)

## 2016-05-19 LAB — MICROALBUMIN / CREATININE URINE RATIO
Creatinine,U: 158.9 mg/dL
Microalb Creat Ratio: 1.5 mg/g (ref 0.0–30.0)
Microalb, Ur: 2.4 mg/dL — ABNORMAL HIGH (ref 0.0–1.9)

## 2016-05-19 LAB — HEMOGLOBIN A1C: Hgb A1c MFr Bld: 8.4 % — ABNORMAL HIGH (ref 4.6–6.5)

## 2016-05-20 LAB — ALPHA-GAL PANEL
Beef IgE: <0.1 kU/L (ref <0.35)
Class: 0
Class: 0
Class: 0
Galactose-alpha-1,3-galactose IgE*: <0.1 kU/L (ref <0.35)
Lamb/Mutton IgE: <0.1 kU/L (ref <0.35)
Pork IgE: <0.1 kU/L (ref <0.35)

## 2016-05-21 ENCOUNTER — Encounter: Payer: Self-pay | Admitting: Internal Medicine

## 2016-05-21 DIAGNOSIS — N6011 Diffuse cystic mastopathy of right breast: Secondary | ICD-10-CM | POA: Insufficient documentation

## 2016-05-21 MED ORDER — METFORMIN HCL 500 MG PO TABS
500.0000 mg | ORAL_TABLET | Freq: Two times a day (BID) | ORAL | 3 refills | Status: DC
Start: 2016-05-21 — End: 2016-07-14

## 2016-05-21 NOTE — Assessment & Plan Note (Addendum)
Loss of control noted  on diet alone .  hemoglobin A1c now 8.4,.   Will recommend starting metformin 500 mg bid and rtc one month  Lab Results  Component Value Date   HGBA1C 8.4 (H) 05/18/2016   Lab Results  Component Value Date   MICROALBUR 2.4 (H) 05/18/2016    Patient is up-to-date on eye exams and foot exam was done today.  There is  no proteinuria on today's micro urinalysis .

## 2016-05-21 NOTE — Assessment & Plan Note (Signed)
New ACC guidelines recommend starting patients aged 46 or higher on moderate intensity statin therapy for diabetes and concurrent LDL between 70-189.Again recommended trial of simvastatin.   Lab Results  Component Value Date   CHOL 186 05/18/2016   HDL 37.30 (L) 05/18/2016   LDLCALC 127 (H) 05/18/2016   LDLDIRECT 137.0 05/18/2016   TRIG 107.0 05/18/2016   CHOLHDL 5 05/18/2016

## 2016-05-21 NOTE — Assessment & Plan Note (Signed)
By biopsy of right breast July 2017

## 2016-05-21 NOTE — Assessment & Plan Note (Signed)
Not at goal ,  Adding hctz to lisinopril 20 mg   Lab Results  Component Value Date   CREATININE 0.96 05/18/2016   Lab Results  Component Value Date   NA 138 05/18/2016   K 4.0 05/18/2016   CL 102 05/18/2016   CO2 33 (H) 05/18/2016

## 2016-05-23 MED ORDER — ATORVASTATIN CALCIUM 20 MG PO TABS: 20.0000 mg | ORAL_TABLET | Freq: Every day | ORAL | 3 refills | Status: DC

## 2016-05-24 ENCOUNTER — Encounter: Payer: Self-pay | Admitting: Internal Medicine

## 2016-06-06 DIAGNOSIS — E118 Type 2 diabetes mellitus with unspecified complications: Secondary | ICD-10-CM

## 2016-06-06 DIAGNOSIS — Z794 Long term (current) use of insulin: Secondary | ICD-10-CM

## 2016-06-06 HISTORY — PX: BREAST BIOPSY: SHX20

## 2016-06-06 HISTORY — DX: Long term (current) use of insulin: E11.8

## 2016-06-06 HISTORY — DX: Long term (current) use of insulin: Z79.4

## 2016-06-10 ENCOUNTER — Other Ambulatory Visit: Payer: Self-pay | Admitting: Internal Medicine

## 2016-06-10 DIAGNOSIS — R928 Other abnormal and inconclusive findings on diagnostic imaging of breast: Secondary | ICD-10-CM

## 2016-06-14 ENCOUNTER — Telehealth: Payer: Self-pay

## 2016-06-14 ENCOUNTER — Encounter: Payer: 59 | Admitting: Physician Assistant

## 2016-06-14 NOTE — Telephone Encounter (Signed)
Called pt to inform her that Allie Dimmer, PA is out sick, had an appt scheduled w/ her today 06/14/2016 @0800 . No answer, left voicemail, instructing pt to return my call at the office to reschedule.

## 2016-06-22 ENCOUNTER — Other Ambulatory Visit: Payer: 59

## 2016-06-24 ENCOUNTER — Ambulatory Visit
Admission: RE | Admit: 2016-06-24 | Discharge: 2016-06-24 | Disposition: A | Discharge status: Home / Self Care | Payer: 59 | Source: Ambulatory Visit | Attending: Internal Medicine | Admitting: Internal Medicine

## 2016-06-24 DIAGNOSIS — R928 Other abnormal and inconclusive findings on diagnostic imaging of breast: Secondary | ICD-10-CM | POA: Diagnosis not present

## 2016-07-07 ENCOUNTER — Ambulatory Visit (INDEPENDENT_AMBULATORY_CARE_PROVIDER_SITE_OTHER): Payer: 59 | Admitting: Orthopaedic Surgery

## 2016-07-07 ENCOUNTER — Ambulatory Visit (INDEPENDENT_AMBULATORY_CARE_PROVIDER_SITE_OTHER): Payer: 59

## 2016-07-07 ENCOUNTER — Encounter (INDEPENDENT_AMBULATORY_CARE_PROVIDER_SITE_OTHER): Payer: Self-pay | Admitting: Orthopaedic Surgery

## 2016-07-07 VITALS — Ht 66.0 in | Wt 214.2 lb

## 2016-07-07 DIAGNOSIS — M25511 Pain in right shoulder: Secondary | ICD-10-CM

## 2016-07-07 MED ORDER — OXYCODONE-ACETAMINOPHEN 5-325 MG PO TABS: 1.0000 | ORAL_TABLET | ORAL | 0 refills | Status: DC | PRN

## 2016-07-07 NOTE — Progress Notes (Signed)
Office Visit Note   Patient: [name]           Date of Birth: [birth year]           MRN: [id number] Visit Date: 07/07/2016              Requested by: Crecencio Mc, MD [address] [place], [place] [address] PCP: Crecencio Mc, MD   Assessment & Plan: Visit Diagnoses:  1. Acute pain of right shoulder     Plan:  #1: MRI scan of the right shoulder to rule out rotator cuff tear #2: Percocet 10/07/2023 one to 2 tablets every 4-6 hours as needed for pain #3: Shoulder immobilizer to the right arm  Follow-Up Instructions: Return in about 1 week (around 07/14/2016).   Orders:  Orders Placed This Encounter  Procedures  . XR Shoulder Right   Meds ordered this encounter  Medications  . oxyCODONE-acetaminophen (PERCOCET/ROXICET) 5-325 MG tablet    Sig: Take 1-2 tablets by mouth every 4 (four) hours as needed for severe pain.    Dispense:  40 tablet    Refill:  0    Order Specific Question:   Supervising Provider    Answer:   Garald Balding [id number]      Procedures: No procedures performed   Clinical Data: No additional findings.   Subjective: Chief Complaint  Patient presents with  . Right Shoulder - Pain, Injury    [name] is a 46 year old American female who is seen today for evaluation of right shoulder. She states this morning she was putting on her shirt that she heard a ripping sound and pain occurred in her shoulder. Apparently went to work and tried to function for the day. However now her pain is worsening and she is having difficulty wth right arm at all. She has pain that traverses to the supraspinatus as well as along the lateral cervical spine.  This happened a couple of years ago same thing    Review of Systems  Constitutional: Negative.   HENT: Negative.   Respiratory: Negative.   Cardiovascular: Negative.   Gastrointestinal: Negative.   Genitourinary: Negative.   Skin: Negative.   Neurological: Negative.   Hematological:  Negative.   Psychiatric/Behavioral: Negative.      Objective: Vital Signs: Ht 5\' 6"  (1.676 m)   Wt 214 lb 3.2 oz (97.2 kg)   BMI 34.57 kg/m   Physical Exam  Constitutional: She is oriented to person, place, and time. She appears well-developed and well-nourished.  HENT:  Head: Normocephalic and atraumatic.  Eyes: EOM are normal. Pupils are equal, round, and reactive to light.  Pulmonary/Chest: Effort normal.  Neurological: She is alert and oriented to person, place, and time.  Skin: Skin is warm and dry.  Psychiatric: She has a normal mood and affect. Her behavior is normal. Judgment and thought content normal.    Right Shoulder Exam   Tests  Drop Arm: positive  Comments:  Right shoulder reveals tenderness to palpation over the supraspinatus up the cervical spine. She does have some anterior pain to palpation. She has markedly limited motion in any plane. She resists me any type of motion. She is diffusely tender again about the shoulder itself.      Specialty Comments:  No specialty comments available.  Imaging: Xr Shoulder Right  Result Date: 07/07/2016 Three-view x-rays of the right shoulder reveals some cystic changes at the tuberosity. She has a type 1-2 acromion. It appears she has a  the humeral head in relation to the glenoid.    PMFS History: Patient Active Problem List   Diagnosis Date Noted  . Fibrocystic breast changes, right 05/21/2016  . Menopausal symptoms 10/30/2015  . Insomnia 10/16/2015  . Acute bronchitis 08/19/2015  . Snoring 07/26/2015  . Migraine without aura and without status migrainosus, not intractable 02/24/2015  . Muscle spasm 02/24/2015  . Facial paresthesia 02/24/2015  . Concussion with loss of consciousness 07/21/2014  . Hyperlipidemia associated with type 2 diabetes mellitus ([place]) 06/13/2014  . Pain in joint, shoulder region 08/11/2013  . Vitamin D deficiency 05/07/2013  . S/P Total Abdominal Hysterectomy and Left  Salpingo-oophorectomy 10/20/2011  . Headache disorder   . Headache syndrome, complicated 99991111  . Uncontrolled type 2 diabetes mellitus ([place]) 07/20/2011  . Obesity (BMI 30-39.9) 07/20/2011  . Hypertension goal BP (blood pressure) < 130/80 07/20/2011   Past Medical History:  Diagnosis Date  . Allergy   . Anemia   . Asthma   . Diabetes mellitus 2011   diet controlled,  gestational  . Headache disorder     Family History  Problem Relation Age of Onset  . Diabetes Mother   . Heart disease Mother   . Hypertension Mother   . Hyperlipidemia Mother   . Cancer Father 68    Lung Cancer  . Mental illness Sister     bipolar, substance abuse,  clean 2 yrs  . Hypertension Sister   . Cancer Paternal Grandmother 74    breast cancer  . Diabetes Son     Past Surgical History:  Procedure Laterality Date  . ABDOMINAL HYSTERECTOMY  2006   heavy menses, endometriosis, l oophrectomy  . BREAST SURGERY     right breast x 2 , benign  . HERNIA REPAIR  2003   left inguinal   . LEFT OOPHORECTOMY    . TUBAL LIGATION     Social History   Occupational History  . Not on file.   Social History Main Topics  . Smoking status: Never Smoker  . Smokeless tobacco: Never Used  . Alcohol use No  . Drug use: No  . Sexual activity: Not Currently    Partners: Male    Birth control/ protection: Surgical

## 2016-07-08 ENCOUNTER — Encounter (INDEPENDENT_AMBULATORY_CARE_PROVIDER_SITE_OTHER): Payer: Self-pay | Admitting: Orthopaedic Surgery

## 2016-07-08 ENCOUNTER — Ambulatory Visit
Admission: RE | Admit: 2016-07-08 | Discharge: 2016-07-08 | Disposition: A | Discharge status: Home / Self Care | Payer: 59 | Source: Ambulatory Visit | Attending: Orthopedic Surgery | Admitting: Orthopedic Surgery

## 2016-07-08 DIAGNOSIS — M7989 Other specified soft tissue disorders: Secondary | ICD-10-CM | POA: Diagnosis not present

## 2016-07-11 ENCOUNTER — Encounter (HOSPITAL_BASED_OUTPATIENT_CLINIC_OR_DEPARTMENT_OTHER): Payer: Self-pay | Admitting: *Deleted

## 2016-07-11 ENCOUNTER — Ambulatory Visit (INDEPENDENT_AMBULATORY_CARE_PROVIDER_SITE_OTHER): Payer: 59 | Admitting: Orthopaedic Surgery

## 2016-07-11 ENCOUNTER — Encounter (INDEPENDENT_AMBULATORY_CARE_PROVIDER_SITE_OTHER): Payer: Self-pay | Admitting: Orthopaedic Surgery

## 2016-07-11 ENCOUNTER — Encounter (HOSPITAL_BASED_OUTPATIENT_CLINIC_OR_DEPARTMENT_OTHER)
Admission: RE | Admit: 2016-07-11 | Discharge: 2016-07-11 | Disposition: A | Discharge status: Home / Self Care | Payer: 59 | Source: Ambulatory Visit | Attending: Orthopaedic Surgery | Admitting: Orthopaedic Surgery

## 2016-07-11 VITALS — BP 118/78 | HR 68 | Ht 66.0 in | Wt 214.0 lb

## 2016-07-11 DIAGNOSIS — Z9102 Food additives allergy status: Secondary | ICD-10-CM | POA: Diagnosis not present

## 2016-07-11 DIAGNOSIS — F419 Anxiety disorder, unspecified: Secondary | ICD-10-CM | POA: Diagnosis not present

## 2016-07-11 DIAGNOSIS — M25511 Pain in right shoulder: Secondary | ICD-10-CM

## 2016-07-11 DIAGNOSIS — E119 Type 2 diabetes mellitus without complications: Secondary | ICD-10-CM | POA: Diagnosis not present

## 2016-07-11 DIAGNOSIS — M19011 Primary osteoarthritis, right shoulder: Secondary | ICD-10-CM | POA: Diagnosis not present

## 2016-07-11 DIAGNOSIS — E785 Hyperlipidemia, unspecified: Secondary | ICD-10-CM | POA: Diagnosis not present

## 2016-07-11 DIAGNOSIS — I10 Essential (primary) hypertension: Secondary | ICD-10-CM | POA: Diagnosis not present

## 2016-07-11 DIAGNOSIS — Z888 Allergy status to other drugs, medicaments and biological substances status: Secondary | ICD-10-CM | POA: Diagnosis not present

## 2016-07-11 DIAGNOSIS — E669 Obesity, unspecified: Secondary | ICD-10-CM | POA: Diagnosis not present

## 2016-07-11 DIAGNOSIS — Z91040 Latex allergy status: Secondary | ICD-10-CM | POA: Diagnosis not present

## 2016-07-11 DIAGNOSIS — Z885 Allergy status to narcotic agent status: Secondary | ICD-10-CM | POA: Diagnosis not present

## 2016-07-11 DIAGNOSIS — M7541 Impingement syndrome of right shoulder: Secondary | ICD-10-CM | POA: Diagnosis present

## 2016-07-11 DIAGNOSIS — Z7984 Long term (current) use of oral hypoglycemic drugs: Secondary | ICD-10-CM | POA: Diagnosis not present

## 2016-07-11 DIAGNOSIS — J45909 Unspecified asthma, uncomplicated: Secondary | ICD-10-CM | POA: Diagnosis not present

## 2016-07-11 DIAGNOSIS — M75111 Incomplete rotator cuff tear or rupture of right shoulder, not specified as traumatic: Secondary | ICD-10-CM | POA: Diagnosis not present

## 2016-07-11 DIAGNOSIS — Z880 Allergy status to penicillin: Secondary | ICD-10-CM | POA: Diagnosis not present

## 2016-07-11 DIAGNOSIS — Z6834 Body mass index (BMI) 34.0-34.9, adult: Secondary | ICD-10-CM | POA: Diagnosis not present

## 2016-07-11 DIAGNOSIS — Z91018 Allergy to other foods: Secondary | ICD-10-CM | POA: Diagnosis not present

## 2016-07-11 LAB — BASIC METABOLIC PANEL
Anion gap: 9 (ref 5–15)
BUN: 9 mg/dL (ref 6–20)
CO2: 23 mmol/L (ref 22–32)
Calcium: 9.5 mg/dL (ref 8.9–10.3)
Chloride: 104 mmol/L (ref 101–111)
Creatinine, Ser: 0.93 mg/dL (ref 0.44–1.00)
GFR calc Af Amer: >60 mL/min (ref >60)
GFR calc non Af Amer: >60 mL/min (ref >60)
Glucose, Bld: 278 mg/dL — ABNORMAL HIGH (ref 65–99)
Potassium: 4.8 mmol/L (ref 3.5–5.1)
Sodium: 136 mmol/L (ref 135–145)

## 2016-07-11 NOTE — Progress Notes (Signed)
Office Visit Note   Patient: [name]           Date of Birth: 1970/08/23           MRN: [id number] Visit Date: 07/11/2016              Requested by: Crecencio Mc, MD [address] [place], [place] [address] PCP: Crecencio Mc, MD   Assessment & Plan: Visit Diagnoses rotator cuff tear right shoulder predominantly involving supraspinatus. There is a high-grade partial tear of the infraspinatus.  Plan: We'll schedule surgery through the [facility] system as she is a Furniture conservator/restorer. This would include an arthroscopic subacromial decompression and a mini open rotator cuff tear repair. I would plan to leave the acromioclavicular joint intact. Long discussion regarding the surgery what it would entail and postoperative course  Follow-Up Instructions: No Follow-up on file.   Orders:  No orders of the defined types were placed in this encounter.  No orders of the defined types were placed in this encounter.     Procedures: No procedures performed   Clinical Data: No additional findings. MRI scan demonstrates a complete tear of the supraspinatus tendon with 19 mm of retraction. There is moderate to severe tendinosis of the infraspinatus at its insertion with a small partial-thickness bursal surface tear. The teres minor is intact. the subscapularis is intact. There was no atrophy or fatty replacement. The biceps long head was intact.  The acromioclavicular joint was normal . Type I acromion. There were no chondral defects in the glenohumeral joint. There appeared to be a small enchondroma in the humeral head  Subjective: No chief complaint on file.   MRI scan of the right shoulder to rule out rotator cuff tear  Percocet 10/07/2023 one to 2 tablets every 4-6 hours as needed for pain : Shoulder immobilizer to the right arm Pt states she is having trouble sleeping and getting comfortable.    Review of Systems   Objective: Vital Signs: Ht 5\' 6"  (1.676 m)   Wt  214 lb (97.1 kg)   BMI 34.54 kg/m   Physical Exam  Ortho Exam exam was limited as the patient is uncomfortable and using an right upper extremity sling she had pain with any attempt at motion and local tenderness over the anterior and lateral subacromial region. As best I can tell biceps was intact. Skin was intact. Neurovascular exam was intact  Specialty Comments:  No specialty comments available.  Imaging: No results found.   PMFS History: Patient Active Problem List   Diagnosis Date Noted  . Fibrocystic breast changes, right 05/21/2016  . Menopausal symptoms 10/30/2015  . Insomnia 10/16/2015  . Acute bronchitis 08/19/2015  . Snoring 07/26/2015  . Migraine without aura and without status migrainosus, not intractable 02/24/2015  . Muscle spasm 02/24/2015  . Facial paresthesia 02/24/2015  . Concussion with loss of consciousness 07/21/2014  . Hyperlipidemia associated with type 2 diabetes mellitus ([place]) 06/13/2014  . Pain in joint, shoulder region 08/11/2013  . Vitamin D deficiency 05/07/2013  . S/P Total Abdominal Hysterectomy and Left Salpingo-oophorectomy 10/20/2011  . Headache disorder   . Headache syndrome, complicated 99991111  . Uncontrolled type 2 diabetes mellitus ([place]) 07/20/2011  . Obesity (BMI 30-39.9) 07/20/2011  . Hypertension goal BP (blood pressure) < 130/80 07/20/2011   Past Medical History:  Diagnosis Date  . Allergy   . Anemia   . Asthma   . Diabetes mellitus 2011   diet controlled,  gestational  .  Headache disorder     Family History  Problem Relation Age of Onset  . Diabetes Mother   . Heart disease Mother   . Hypertension Mother   . Hyperlipidemia Mother   . Cancer Father 67    Lung Cancer  . Mental illness Sister     bipolar, substance abuse,  clean 2 yrs  . Hypertension Sister   . Cancer Paternal Grandmother 56    breast cancer  . Diabetes Son     Past Surgical History:  Procedure Laterality Date  . ABDOMINAL HYSTERECTOMY  2006     heavy menses, endometriosis, l oophrectomy  . BREAST SURGERY     right breast x 2 , benign  . HERNIA REPAIR  2003   left inguinal   . LEFT OOPHORECTOMY    . TUBAL LIGATION     Social History   Occupational History  . Not on file.   Social History Main Topics  . Smoking status: Never Smoker  . Smokeless tobacco: Never Used  . Alcohol use No  . Drug use: No  . Sexual activity: Not Currently    Partners: Male    Birth control/ protection: Surgical

## 2016-07-11 NOTE — Progress Notes (Signed)
EKG reviewed by Dr. Hollis, will proceed with surgery as scheduled.  

## 2016-07-12 NOTE — Progress Notes (Signed)
Dr. Gifford Shave notified glucose level 278, will check again day of surgery.

## 2016-07-12 NOTE — H&P (Signed)
Stephanie Fears, MD   Stephanie Borg, PA-C [address] [place], [place]  [address]                             [phone]   ORTHOPAEDIC HISTORY & PHYSICAL  MATISON [name] MRN:  [id number] DOB/SEX:  08-24-1970/female  CHIEF COMPLAINT:  Painful right shoulder  HISTORY:[name] is a 46 year old American female who is seen today for evaluation of right shoulder. She states this morning she was putting on her shirt that she heard a ripping sound and pain occurred in her shoulder. Apparently went to work and tried to function for the day. However now her pain is worsening and she is having difficulty wth right arm at all. She has pain that traverses to the supraspinatus as well as along the lateral cervical spine.  This happened a couple of years ago same thing  MRI ordered  MRI scan demonstrates a complete tear of the supraspinatus tendon with 19 mm of retraction. There is moderate to severe tendinosis of the infraspinatus at its insertion with a small partial-thickness bursal surface tear. The teres minor is intact. the subscapularis is intact. There was no atrophy or fatty replacement. The biceps long head was intact.  The acromioclavicular joint was normal . Type I acromion. There were no chondral defects in the glenohumeral joint. There appeared to be a small enchondroma in the humeral head   PAST MEDICAL HISTORY: Patient Active Problem List   Diagnosis Date Noted  . Fibrocystic breast changes, right 05/21/2016  . Menopausal symptoms 10/30/2015  . Insomnia 10/16/2015  . Acute bronchitis 08/19/2015  . Snoring 07/26/2015  . Migraine without aura and without status migrainosus, not intractable 02/24/2015  . Muscle spasm 02/24/2015  . Facial paresthesia 02/24/2015  . Concussion with loss of consciousness 07/21/2014  . Hyperlipidemia associated with type 2 diabetes mellitus ([place]) 06/13/2014  . Pain in joint, shoulder region 08/11/2013  . Vitamin D deficiency 05/07/2013  .  S/P Total Abdominal Hysterectomy and Left Salpingo-oophorectomy 10/20/2011  . Headache disorder   . Headache syndrome, complicated 99991111  . Uncontrolled type 2 diabetes mellitus ([place]) 07/20/2011  . Obesity (BMI 30-39.9) 07/20/2011  . Hypertension goal BP (blood pressure) < 130/80 07/20/2011   Past Medical History:  Diagnosis Date  . Allergy   . Anemia   . Anxiety    claustrophobic  . Asthma   . Diabetes mellitus 2011   did not start metforfin, losing weight  . Headache disorder   . Hypertension   . Shoulder impingement syndrome, right    Past Surgical History:  Procedure Laterality Date  . ABDOMINAL HYSTERECTOMY  2006   heavy menses, endometriosis, l oophrectomy  . BACK SURGERY    . BREAST SURGERY     right breast x 2 , benign  . HERNIA REPAIR  2003   left inguinal   . LEFT OOPHORECTOMY    . TUBAL LIGATION       MEDICATIONS:   No prescriptions prior to admission.   No current facility-administered medications for this encounter.    Current Outpatient Prescriptions  Medication Sig Dispense Refill  . atorvastatin (LIPITOR) 20 MG tablet Take 1 tablet (20 mg total) by mouth daily. 90 tablet 3  . diazepam (VALIUM) 10 MG tablet Take 1 tablet (10 mg total) by mouth every 12 (twelve) hours as needed (muscle spasm). 30 tablet 1  . esomeprazole (NEXIUM) 20 MG capsule Take 20 mg  by mouth daily at 12 noon.    . hydrochlorothiazide (HYDRODIURIL) 25 MG tablet Take 1 tablet (25 mg total) by mouth daily. 30 tablet 1  . ketorolac (TORADOL) 10 MG tablet Take 1 tablet (10 mg total) by mouth every 6 (six) hours as needed. 30 tablet 1  . lisinopril (PRINIVIL,ZESTRIL) 10 MG tablet Take 2 tablets (20 mg total) by mouth daily. 180 tablet 3  . meclizine (ANTIVERT) 25 MG tablet Take 25 mg by mouth 3 (three) times daily as needed for dizziness.    . meloxicam (MOBIC) 15 MG tablet Take 1 tablet (15 mg total) by mouth daily. 30 tablet 4  . metoprolol succinate (TOPROL-XL) 50 MG 24 hr tablet  TAKE 1 TABLET BY MOUTH DAILY. TAKE WITH OR IMMEDIATELY FOLLOWING A MEAL. 90 tablet 1  . montelukast (SINGULAIR) 10 MG tablet Take 1 tablet (10 mg total) by mouth at bedtime. 30 tablet 3  . ondansetron (ZOFRAN) 4 MG tablet Take 1 tablet (4 mg total) by mouth every 8 (eight) hours as needed for nausea or vomiting. 10 tablet 0  . orphenadrine (NORFLEX) 100 MG tablet Take 1 tablet (100 mg total) by mouth 2 (two) times daily as needed. 60 tablet 1  . oxyCODONE-acetaminophen (PERCOCET/ROXICET) 5-325 MG tablet Take 1-2 tablets by mouth every 4 (four) hours as needed for severe pain. 40 tablet 0  . protriptyline (VIVACTIL) 10 MG tablet Take 1 tablet (10 mg total) by mouth at bedtime. 30 tablet 2  . albuterol (PROVENTIL HFA;VENTOLIN HFA) 108 (90 BASE) MCG/ACT inhaler Inhale 2 puffs into the lungs every 6 (six) hours as needed for wheezing or shortness of breath. One puff as needed 3.7 g 1  . metFORMIN (GLUCOPHAGE) 500 MG tablet Take 1 tablet (500 mg total) by mouth 2 (two) times daily with a meal. (Patient not taking: Reported on 07/07/2016) 180 tablet 3    ALLERGIES:    Allergies  Allergen Reactions  . Botox [Onabotulinumtoxina] Shortness Of Breath    syncope  . Kiwi Extract Shortness Of Breath  . Maxalt [Rizatriptan Benzoate] Anaphylaxis    Chest pain  . Nitrous Oxide Shortness Of Breath    syncope  . Triptans Shortness Of Breath  . Aspirin Nausea And Vomiting    Mouth blisters  . Flagyl [Metronidazole]   . Hydrocodone-Acetaminophen Other (See Comments)  . Latex     Sometimes causes rash  . Mango Flavor   . Rizatriptan Other (See Comments)  . Tetracycline Nausea And Vomiting  . Vicodin [Hydrocodone-Acetaminophen]     hallucinations  . Penicillins Rash, Other (See Comments) and Nausea And Vomiting    Has patient had a PCN reaction causing immediate rash, facial/tongue/throat swelling, SOB or lightheadedness with hypotension:  RASH Has patient had a PCN reaction causing severe rash involving  mucus membranes or skin necrosis: no Has patient had a PCN reaction that required hospitalization no Has patient had a PCN reaction occurring within the last 10 years: no If all of the above answers are "NO", then may proceed with Cephalosporin use.   . Tetracyclines & Related Rash   REVIEW OF SYSTEMS:  Pertinent items noted in HPI and remainder of comprehensive ROS otherwise negative.   FAMILY HISTORY:   Family History  Problem Relation Age of Onset  . Diabetes Mother   . Heart disease Mother   . Hypertension Mother   . Hyperlipidemia Mother   . Cancer Father 38    Lung Cancer  . Mental illness Sister  bipolar, substance abuse,  clean 2 yrs  . Hypertension Sister   . Cancer Paternal Grandmother 59    breast cancer  . Diabetes Son     SOCIAL HISTORY:   Social History  Substance Use Topics  . Smoking status: Never Smoker  . Smokeless tobacco: Never Used  . Alcohol use No      EXAMINATION: Vital signs in last 24 hours:    Head is normocephalic.   Eyes:  Pupils equal, round and reactive to light and accommodation.  Extraocular intact. ENT: Ears, nose, and throat were benign.   Neck: supple, no bruits were noted.   Chest: good expansion.   Lungs: essentially clear.   Cardiac: regular rhythm and rate, normal S1, S2.  No murmurs appreciated. Pulses :  2+ bilateral and symmetric in upper extremities. Abdomen is scaphoid, soft, nontender, no masses palpable, normal bowel sounds  present. CNS:  He is oriented x3 and cranial nerves II-XII grossly intact. Breast, rectal, and genital exams: not performed and not indicated for an orthopedic evaluation. Musculoskeletal: Drop Arm: positive  Comments:  Right shoulder reveals tenderness to palpation over the supraspinatus up the cervical spine. She does have some anterior pain to palpation. She has markedly limited motion in any plane. She resists me any type of motion. She is diffusely tender again about the shoulder  itself.    Imaging Review See HPI  ASSESSMENT: right rotator cuff tear  Past Medical History:  Diagnosis Date  . Allergy   . Anemia   . Anxiety    claustrophobic  . Asthma   . Diabetes mellitus 2011   did not start metforfin, losing weight  . Headache disorder   . Hypertension   . Shoulder impingement syndrome, right     PLAN: Plan for RIGHT SHOULDER ARTHROSCOPY WITH SUBACROMIAL DECOMPRESSION AND OPEN ROTATOR CUFF REPAIR, OPEN BICEP TENDON REPAIR  The procedure,  risks, and benefits of surgery were presented and reviewed. The risks including but not limited to infection, blood clots, vascular and nerve injury, stiffness,  among others were discussed. The patient acknowledged the explanation, agreed to proceed.   Mike Craze [place], [place] [phone]  07/12/2016 1:09 PM

## 2016-07-13 NOTE — Pre-Procedure Instructions (Signed)
Surgery moved back to 1330. Pt informed of new arrival time of 1200 per OR by Fritzi Mandes, RN. Instructed to have no solids after midnight. Clear liquids ok until 0600, NPO after 0600.

## 2016-07-14 ENCOUNTER — Ambulatory Visit (HOSPITAL_BASED_OUTPATIENT_CLINIC_OR_DEPARTMENT_OTHER): Payer: 59 | Admitting: Anesthesiology

## 2016-07-14 ENCOUNTER — Encounter (HOSPITAL_BASED_OUTPATIENT_CLINIC_OR_DEPARTMENT_OTHER)
Admission: RE | Disposition: A | Discharge status: Home / Self Care | Payer: Self-pay | Source: Ambulatory Visit | Attending: Orthopaedic Surgery

## 2016-07-14 ENCOUNTER — Ambulatory Visit (HOSPITAL_BASED_OUTPATIENT_CLINIC_OR_DEPARTMENT_OTHER)
Admission: RE | Admit: 2016-07-14 | Discharge: 2016-07-14 | Disposition: A | Discharge status: Home / Self Care | Payer: 59 | Source: Ambulatory Visit | Attending: Orthopaedic Surgery | Admitting: Orthopaedic Surgery

## 2016-07-14 ENCOUNTER — Encounter (HOSPITAL_BASED_OUTPATIENT_CLINIC_OR_DEPARTMENT_OTHER): Payer: Self-pay | Admitting: Anesthesiology

## 2016-07-14 DIAGNOSIS — M75121 Complete rotator cuff tear or rupture of right shoulder, not specified as traumatic: Secondary | ICD-10-CM | POA: Diagnosis not present

## 2016-07-14 DIAGNOSIS — F419 Anxiety disorder, unspecified: Secondary | ICD-10-CM | POA: Insufficient documentation

## 2016-07-14 DIAGNOSIS — M75111 Incomplete rotator cuff tear or rupture of right shoulder, not specified as traumatic: Secondary | ICD-10-CM | POA: Diagnosis present

## 2016-07-14 DIAGNOSIS — M19011 Primary osteoarthritis, right shoulder: Secondary | ICD-10-CM | POA: Diagnosis not present

## 2016-07-14 DIAGNOSIS — Z6834 Body mass index (BMI) 34.0-34.9, adult: Secondary | ICD-10-CM | POA: Diagnosis not present

## 2016-07-14 DIAGNOSIS — I10 Essential (primary) hypertension: Secondary | ICD-10-CM | POA: Insufficient documentation

## 2016-07-14 DIAGNOSIS — M7541 Impingement syndrome of right shoulder: Secondary | ICD-10-CM | POA: Diagnosis present

## 2016-07-14 DIAGNOSIS — E119 Type 2 diabetes mellitus without complications: Secondary | ICD-10-CM | POA: Diagnosis not present

## 2016-07-14 DIAGNOSIS — E669 Obesity, unspecified: Secondary | ICD-10-CM | POA: Insufficient documentation

## 2016-07-14 DIAGNOSIS — Z885 Allergy status to narcotic agent status: Secondary | ICD-10-CM | POA: Insufficient documentation

## 2016-07-14 DIAGNOSIS — J45909 Unspecified asthma, uncomplicated: Secondary | ICD-10-CM | POA: Diagnosis not present

## 2016-07-14 DIAGNOSIS — Z91018 Allergy to other foods: Secondary | ICD-10-CM | POA: Insufficient documentation

## 2016-07-14 DIAGNOSIS — Z880 Allergy status to penicillin: Secondary | ICD-10-CM | POA: Insufficient documentation

## 2016-07-14 DIAGNOSIS — Z91040 Latex allergy status: Secondary | ICD-10-CM | POA: Insufficient documentation

## 2016-07-14 DIAGNOSIS — G8918 Other acute postprocedural pain: Secondary | ICD-10-CM | POA: Diagnosis not present

## 2016-07-14 DIAGNOSIS — Z7984 Long term (current) use of oral hypoglycemic drugs: Secondary | ICD-10-CM | POA: Insufficient documentation

## 2016-07-14 DIAGNOSIS — E785 Hyperlipidemia, unspecified: Secondary | ICD-10-CM | POA: Insufficient documentation

## 2016-07-14 DIAGNOSIS — M19019 Primary osteoarthritis, unspecified shoulder: Secondary | ICD-10-CM | POA: Diagnosis present

## 2016-07-14 DIAGNOSIS — Z888 Allergy status to other drugs, medicaments and biological substances status: Secondary | ICD-10-CM | POA: Insufficient documentation

## 2016-07-14 DIAGNOSIS — Z9102 Food additives allergy status: Secondary | ICD-10-CM | POA: Insufficient documentation

## 2016-07-14 HISTORY — PX: SHOULDER ARTHROSCOPY WITH SUBACROMIAL DECOMPRESSION AND OPEN ROTATOR C: SHX5688

## 2016-07-14 HISTORY — DX: Anxiety disorder, unspecified: F41.9

## 2016-07-14 HISTORY — DX: Impingement syndrome of right shoulder: M75.41

## 2016-07-14 HISTORY — DX: Essential (primary) hypertension: I10

## 2016-07-14 SURGERY — SHOULDER ARTHROSCOPY WITH SUBACROMIAL DECOMPRESSION AND OPEN ROTATOR CUFF REPAIR, OPEN BICEPS TENDON REPAIR
Anesthesia: General | Site: Shoulder | Laterality: Right

## 2016-07-14 MED ORDER — ONDANSETRON HCL 4 MG/2ML IJ SOLN
INTRAMUSCULAR | Status: AC
  Filled 2016-07-14: qty 2

## 2016-07-14 MED ORDER — FENTANYL CITRATE (PF) 100 MCG/2ML IJ SOLN
INTRAMUSCULAR | Status: DC | PRN
  Administered 2016-07-14: 50 ug via INTRAVENOUS

## 2016-07-14 MED ORDER — ONDANSETRON HCL 4 MG/2ML IJ SOLN: 4.0000 mg | Freq: Four times a day (QID) | INTRAMUSCULAR | Status: DC | PRN

## 2016-07-14 MED ORDER — SUCCINYLCHOLINE CHLORIDE 20 MG/ML IJ SOLN
INTRAMUSCULAR | Status: DC | PRN
  Administered 2016-07-14: 100 mg via INTRAVENOUS

## 2016-07-14 MED ORDER — HYDROMORPHONE HCL 1 MG/ML IJ SOLN
INTRAMUSCULAR | Status: AC
  Filled 2016-07-14: qty 1

## 2016-07-14 MED ORDER — PROPOFOL 10 MG/ML IV BOLUS
INTRAVENOUS | Status: DC | PRN
  Administered 2016-07-14: 180 mg via INTRAVENOUS

## 2016-07-14 MED ORDER — MIDAZOLAM HCL 2 MG/2ML IJ SOLN
INTRAMUSCULAR | Status: AC
  Filled 2016-07-14: qty 2

## 2016-07-14 MED ORDER — SUCCINYLCHOLINE CHLORIDE 200 MG/10ML IV SOSY
PREFILLED_SYRINGE | INTRAVENOUS | Status: AC
  Filled 2016-07-14: qty 10

## 2016-07-14 MED ORDER — LIDOCAINE 2% (20 MG/ML) 5 ML SYRINGE
INTRAMUSCULAR | Status: AC
  Filled 2016-07-14: qty 5

## 2016-07-14 MED ORDER — ONDANSETRON HCL 4 MG/2ML IJ SOLN
INTRAMUSCULAR | Status: DC | PRN
  Administered 2016-07-14: 4 mg via INTRAVENOUS

## 2016-07-14 MED ORDER — SCOPOLAMINE 1 MG/3DAYS TD PT72
MEDICATED_PATCH | TRANSDERMAL | Status: AC
  Filled 2016-07-14: qty 1

## 2016-07-14 MED ORDER — DEXAMETHASONE SODIUM PHOSPHATE 10 MG/ML IJ SOLN
INTRAMUSCULAR | Status: AC
  Filled 2016-07-14: qty 1

## 2016-07-14 MED ORDER — VANCOMYCIN HCL IN DEXTROSE 1-5 GM/200ML-% IV SOLN
1000.0000 mg | INTRAVENOUS | Status: AC
  Administered 2016-07-14: 1000 mg via INTRAVENOUS

## 2016-07-14 MED ORDER — ACETAMINOPHEN 10 MG/ML IV SOLN
1000.0000 mg | Freq: Once | INTRAVENOUS | Status: AC
Start: 2016-07-14 — End: 2016-07-14
  Administered 2016-07-14: 1000 mg via INTRAVENOUS

## 2016-07-14 MED ORDER — OXYCODONE-ACETAMINOPHEN 5-325 MG PO TABS
1.0000 | ORAL_TABLET | ORAL | 0 refills | Status: DC | PRN
Start: 2016-07-14 — End: 2016-08-31

## 2016-07-14 MED ORDER — MIDAZOLAM HCL 2 MG/2ML IJ SOLN
1.0000 mg | INTRAMUSCULAR | Status: DC | PRN
  Administered 2016-07-14: 2 mg via INTRAVENOUS

## 2016-07-14 MED ORDER — LIDOCAINE HCL (CARDIAC) 20 MG/ML IV SOLN
INTRAVENOUS | Status: DC | PRN
  Administered 2016-07-14: 60 mg via INTRAVENOUS

## 2016-07-14 MED ORDER — SODIUM CHLORIDE 0.9 % IR SOLN
Status: DC | PRN
  Administered 2016-07-14: 9000 mL

## 2016-07-14 MED ORDER — SCOPOLAMINE 1 MG/3DAYS TD PT72
1.0000 | MEDICATED_PATCH | Freq: Once | TRANSDERMAL | Status: DC | PRN
  Administered 2016-07-14: 1.5 mg via TRANSDERMAL

## 2016-07-14 MED ORDER — ROCURONIUM BROMIDE 100 MG/10ML IV SOLN
INTRAVENOUS | Status: DC | PRN
  Administered 2016-07-14: 30 mg via INTRAVENOUS

## 2016-07-14 MED ORDER — VANCOMYCIN HCL IN DEXTROSE 1-5 GM/200ML-% IV SOLN
INTRAVENOUS | Status: AC
  Filled 2016-07-14: qty 200

## 2016-07-14 MED ORDER — SODIUM CHLORIDE 0.9 % IV SOLN: 75.0000 mL/h | INTRAVENOUS | Status: DC

## 2016-07-14 MED ORDER — DEXAMETHASONE SODIUM PHOSPHATE 4 MG/ML IJ SOLN
INTRAMUSCULAR | Status: DC | PRN
  Administered 2016-07-14: 10 mg via INTRAVENOUS

## 2016-07-14 MED ORDER — FENTANYL CITRATE (PF) 100 MCG/2ML IJ SOLN
INTRAMUSCULAR | Status: AC
  Filled 2016-07-14: qty 2

## 2016-07-14 MED ORDER — LACTATED RINGERS IV SOLN
INTRAVENOUS | Status: DC
  Administered 2016-07-14 (×2): via INTRAVENOUS

## 2016-07-14 MED ORDER — PROMETHAZINE HCL 25 MG/ML IJ SOLN
INTRAMUSCULAR | Status: AC
Start: 2016-07-14 — End: 2016-07-14
  Filled 2016-07-14: qty 1

## 2016-07-14 MED ORDER — MIDAZOLAM HCL 5 MG/5ML IJ SOLN
INTRAMUSCULAR | Status: DC | PRN
  Administered 2016-07-14: 2 mg via INTRAVENOUS

## 2016-07-14 MED ORDER — BUPIVACAINE-EPINEPHRINE (PF) 0.5% -1:200000 IJ SOLN
INTRAMUSCULAR | Status: DC | PRN
  Administered 2016-07-14: 30 mL via PERINEURAL

## 2016-07-14 MED ORDER — HYDROMORPHONE HCL 1 MG/ML IJ SOLN
0.2500 mg | INTRAMUSCULAR | Status: DC | PRN
  Administered 2016-07-14 (×2): 0.5 mg via INTRAVENOUS

## 2016-07-14 MED ORDER — SUGAMMADEX SODIUM 200 MG/2ML IV SOLN
INTRAVENOUS | Status: DC | PRN
  Administered 2016-07-14: 200 mg via INTRAVENOUS

## 2016-07-14 MED ORDER — PROMETHAZINE HCL 25 MG/ML IJ SOLN
6.2500 mg | Freq: Once | INTRAMUSCULAR | Status: AC
  Administered 2016-07-14: 6.25 mg via INTRAVENOUS

## 2016-07-14 MED ORDER — METFORMIN HCL 500 MG PO TABS: 500.0000 mg | ORAL_TABLET | Freq: Two times a day (BID) | ORAL | 3 refills | Status: DC

## 2016-07-14 MED ORDER — CHLORHEXIDINE GLUCONATE 4 % EX LIQD: 60.0000 mL | Freq: Once | CUTANEOUS | Status: DC

## 2016-07-14 MED ORDER — ACETAMINOPHEN 10 MG/ML IV SOLN
INTRAVENOUS | Status: AC
  Filled 2016-07-14: qty 100

## 2016-07-14 MED ORDER — FENTANYL CITRATE (PF) 100 MCG/2ML IJ SOLN
50.0000 ug | INTRAMUSCULAR | Status: DC | PRN
  Administered 2016-07-14: 100 ug via INTRAVENOUS

## 2016-07-14 MED ORDER — PHENYLEPHRINE HCL 10 MG/ML IJ SOLN
INTRAVENOUS | Status: DC | PRN
  Administered 2016-07-14: 40 ug/min via INTRAVENOUS

## 2016-07-14 SURGICAL SUPPLY — 80 items
ANCH SUT KNTLS STRL SHLDR SYS (Anchor) ×1 IMPLANT
ANCHOR PEEK ALL THREAD (Anchor) ×2 IMPLANT
ANCHOR SUT QUATTRO KNTLS 4.5 (Anchor) ×2 IMPLANT
BAG DECANTER FOR FLEXI CONT (MISCELLANEOUS) IMPLANT
BENZOIN TINCTURE PRP APPL 2/3 (GAUZE/BANDAGES/DRESSINGS) IMPLANT
BLADE 4.2CUDA (BLADE) ×2 IMPLANT
BLADE AVERAGE 25X9 (BLADE) IMPLANT
BLADE CUDA 5.5 (BLADE) IMPLANT
BLADE GREAT WHITE 4.2 (BLADE) IMPLANT
BLADE SURG 15 STRL LF DISP TIS (BLADE) ×1 IMPLANT
BLADE SURG 15 STRL SS (BLADE) ×1
BUR OVAL 6.0 (BURR) ×2 IMPLANT
CANNULA 5.75X71 LONG (CANNULA) IMPLANT
CANNULA ACUFLEX KIT 5X76 (CANNULA) ×2 IMPLANT
CANNULA TWIST IN 8.25X7CM (CANNULA) IMPLANT
CLEANER CAUTERY TIP 5X5 PAD (MISCELLANEOUS) IMPLANT
CUTTER MENISCUS  4.2MM (BLADE)
CUTTER MENISCUS 4.2MM (BLADE) IMPLANT
DECANTER SPIKE VIAL GLASS SM (MISCELLANEOUS) IMPLANT
DRAPE IMP U-DRAPE 54X76 (DRAPES) ×2 IMPLANT
DRAPE SHOULDER BEACH CHAIR (DRAPES) ×2 IMPLANT
DRAPE SURG 17X23 STRL (DRAPES) ×2 IMPLANT
DRAPE U-SHAPE 47X51 STRL (DRAPES) ×2 IMPLANT
DRSG EMULSION OIL 3X3 NADH (GAUZE/BANDAGES/DRESSINGS) ×2 IMPLANT
DRSG PAD ABDOMINAL 8X10 ST (GAUZE/BANDAGES/DRESSINGS) ×4 IMPLANT
DURAPREP 26ML APPLICATOR (WOUND CARE) ×2 IMPLANT
ELECT NEEDLE TIP 2.8 STRL (NEEDLE) ×2 IMPLANT
ELECT REM PT RETURN 9FT ADLT (ELECTROSURGICAL) ×2
ELECTRODE REM PT RTRN 9FT ADLT (ELECTROSURGICAL) ×1 IMPLANT
GAUZE SPONGE 4X4 12PLY STRL (GAUZE/BANDAGES/DRESSINGS) ×2 IMPLANT
GAUZE SPONGE 4X4 16PLY XRAY LF (GAUZE/BANDAGES/DRESSINGS) IMPLANT
GLOVE BIOGEL PI IND STRL 7.0 (GLOVE) ×2 IMPLANT
GLOVE BIOGEL PI IND STRL 8 (GLOVE) ×1 IMPLANT
GLOVE BIOGEL PI IND STRL 8.5 (GLOVE) ×1 IMPLANT
GLOVE BIOGEL PI INDICATOR 7.0 (GLOVE) ×2
GLOVE BIOGEL PI INDICATOR 8 (GLOVE) ×1
GLOVE BIOGEL PI INDICATOR 8.5 (GLOVE) ×1
GLOVE ECLIPSE 8.0 STRL XLNG CF (GLOVE) ×4 IMPLANT
GOWN STRL REUS W/ TWL LRG LVL3 (GOWN DISPOSABLE) ×1 IMPLANT
GOWN STRL REUS W/ TWL XL LVL3 (GOWN DISPOSABLE) ×1 IMPLANT
GOWN STRL REUS W/TWL 2XL LVL3 (GOWN DISPOSABLE) IMPLANT
GOWN STRL REUS W/TWL LRG LVL3 (GOWN DISPOSABLE) ×2
GOWN STRL REUS W/TWL XL LVL3 (GOWN DISPOSABLE) ×1
MANIFOLD NEPTUNE II (INSTRUMENTS) ×2 IMPLANT
NEEDLE 1/2 CIR CATGUT .05X1.09 (NEEDLE) IMPLANT
NEEDLE SCORPION MULTI FIRE (NEEDLE) IMPLANT
NS IRRIG 1000ML POUR BTL (IV SOLUTION) IMPLANT
PACK ARTHROSCOPY DSU (CUSTOM PROCEDURE TRAY) ×2 IMPLANT
PACK BASIN DAY SURGERY FS (CUSTOM PROCEDURE TRAY) ×2 IMPLANT
PAD CLEANER CAUTERY TIP 5X5 (MISCELLANEOUS)
PENCIL BUTTON HOLSTER BLD 10FT (ELECTRODE) ×2 IMPLANT
PROBE BIPOLAR ATHRO 135MM 90D (MISCELLANEOUS) ×2 IMPLANT
RESTRAINT HEAD UNIVERSAL NS (MISCELLANEOUS) ×2 IMPLANT
SET ARTHROSCOPY TUBING (MISCELLANEOUS) ×1
SET ARTHROSCOPY TUBING LN (MISCELLANEOUS) ×1 IMPLANT
SLEEVE SCD COMPRESS KNEE MED (MISCELLANEOUS) ×2 IMPLANT
SLING ARM FOAM STRAP LRG (SOFTGOODS) IMPLANT
SLING ARM IMMOBILIZER LRG (SOFTGOODS) ×2 IMPLANT
SLING ARM MED ADULT FOAM STRAP (SOFTGOODS) IMPLANT
SPONGE LAP 4X18 X RAY DECT (DISPOSABLE) ×4 IMPLANT
STAPLER VISISTAT 35W (STAPLE) IMPLANT
STRIP CLOSURE SKIN 1/2X4 (GAUZE/BANDAGES/DRESSINGS) IMPLANT
SUCTION FRAZIER HANDLE 10FR (MISCELLANEOUS) ×1
SUCTION TUBE FRAZIER 10FR DISP (MISCELLANEOUS) ×1 IMPLANT
SUT BONE WAX W31G (SUTURE) IMPLANT
SUT ETHIBOND 2-0 V-5 NEEDLE (SUTURE) ×4 IMPLANT
SUT ETHILON 3 0 PS 1 (SUTURE) IMPLANT
SUT ETHILON 4 0 PS 2 18 (SUTURE) IMPLANT
SUT PROLENE 3 0 PS 2 (SUTURE) IMPLANT
SUT VIC AB 0 CT1 27 (SUTURE) ×2
SUT VIC AB 0 CT1 27XBRD ANBCTR (SUTURE) ×1 IMPLANT
SUT VIC AB 0 SH 27 (SUTURE) ×2 IMPLANT
SUT VIC AB 2-0 SH 27 (SUTURE)
SUT VIC AB 2-0 SH 27XBRD (SUTURE) IMPLANT
SUT VIC AB 3-0 SH 27 (SUTURE)
SUT VIC AB 3-0 SH 27X BRD (SUTURE) IMPLANT
SYR BULB 3OZ (MISCELLANEOUS) IMPLANT
TOWEL OR 17X24 6PK STRL BLUE (TOWEL DISPOSABLE) ×2 IMPLANT
WATER STERILE IRR 1000ML POUR (IV SOLUTION) ×2 IMPLANT
YANKAUER SUCT BULB TIP NO VENT (SUCTIONS) ×2 IMPLANT

## 2016-07-14 NOTE — Anesthesia Preprocedure Evaluation (Signed)
Anesthesia Evaluation  Patient identified by MRN, date of birth, ID band Patient awake    Reviewed: Allergy & Precautions, H&P , NPO status , Patient's Chart, lab work & pertinent test results  Airway Mallampati: II   Neck ROM: full    Dental   Pulmonary asthma ,    breath sounds clear to auscultation       Cardiovascular hypertension,  Rhythm:regular Rate:Normal     Neuro/Psych  Headaches, PSYCHIATRIC DISORDERS Anxiety    GI/Hepatic   Endo/Other  diabetes, Type 2  Renal/GU      Musculoskeletal   Abdominal   Peds  Hematology   Anesthesia Other Findings   Reproductive/Obstetrics                             Anesthesia Physical Anesthesia Plan  ASA: II  Anesthesia Plan: General   Post-op Pain Management:  Regional for Post-op pain   Induction: Intravenous  Airway Management Planned: Oral ETT  Additional Equipment:   Intra-op Plan:   Post-operative Plan: Extubation in OR  Informed Consent: I have reviewed the patients History and Physical, chart, labs and discussed the procedure including the risks, benefits and alternatives for the proposed anesthesia with the patient or authorized representative who has indicated his/her understanding and acceptance.     Plan Discussed with: CRNA, Anesthesiologist and Surgeon  Anesthesia Plan Comments:         Anesthesia Quick Evaluation

## 2016-07-14 NOTE — Anesthesia Procedure Notes (Signed)
Anesthesia Regional Block:  Interscalene brachial plexus block  Pre-Anesthetic Checklist: ,, timeout performed, Correct Patient, Correct Site, Correct Laterality, Correct Procedure, Correct Position, site marked, Risks and benefits discussed,  Surgical consent,  Pre-op evaluation,  At surgeon's request and post-op pain management  Laterality: Right  Prep: chloraprep       Needles:  Injection technique: Single-shot  Needle Type: Echogenic Stimulator Needle     Needle Length: 5cm 5 cm Needle Gauge: 22 and 22 G    Additional Needles:  Procedures: ultrasound guided (picture in chart) and nerve stimulator Interscalene brachial plexus block  Nerve Stimulator or Paresthesia:  Response: biceps flexion, 0.45 mA,   Additional Responses:   Narrative:  Start time: 07/14/2016 1:12 PM End time: 07/14/2016 1:17 PM Injection made incrementally with aspirations every 5 mL.  Performed by: Personally  Anesthesiologist: Damonie Ellenwood  Additional Notes: Functioning IV was confirmed and monitors were applied.  A 56mm 22ga Arrow echogenic stimulator needle was used. Sterile prep and drape,hand hygiene and sterile gloves were used.  Negative aspiration and negative test dose prior to incremental administration of local anesthetic. The patient tolerated the procedure well.  Ultrasound guidance: relevent anatomy identified, needle position confirmed, local anesthetic spread visualized around nerve(s), vascular puncture avoided.  Image printed for medical record.

## 2016-07-14 NOTE — Anesthesia Procedure Notes (Signed)
Procedure Name: Intubation Date/Time: 07/14/2016 2:00 PM Performed by: Justice Rocher Pre-anesthesia Checklist: Patient identified, Emergency Drugs available, Suction available and Patient being monitored Patient Re-evaluated:Patient Re-evaluated prior to inductionOxygen Delivery Method: Circle system utilized Preoxygenation: Pre-oxygenation with 100% oxygen Intubation Type: IV induction Ventilation: Mask ventilation without difficulty Laryngoscope Size: Mac and 4 Grade View: Grade III Tube type: Oral Number of attempts: 1 Airway Equipment and Method: Stylet and Oral airway Placement Confirmation: ETT inserted through vocal cords under direct vision,  positive ETCO2 and breath sounds checked- equal and bilateral Secured at: 7 cm Tube secured with: Tape Dental Injury: Teeth and Oropharynx as per pre-operative assessment

## 2016-07-14 NOTE — Transfer of Care (Signed)
Immediate Anesthesia Transfer of Care Note  Patient: [name]  Procedure(s) Performed: Procedure(s): RIGHT SHOULDER ARTHROSCOPY WITH SUBACROMIAL DECOMPRESSION AND OPEN ROTATOR CUFF REPAIR, OPEN BICEP TENDON REPAIR (Right)  Patient Location: PACU  Anesthesia Type:GA combined with regional for post-op pain  Level of Consciousness: awake and alert   Airway & Oxygen Therapy: Patient Spontanous Breathing and Patient connected to face mask oxygen  Post-op Assessment: Report given to RN and Post -op Vital signs reviewed and stable  Post vital signs: Reviewed and stable  Last Vitals:  Vitals:   07/14/16 1329 07/14/16 1557  BP: 120/61   Pulse: 66 97  Resp: 11   Temp:      Last Pain:  Vitals:   07/14/16 1207  TempSrc: Oral  PainSc: 3       Patients Stated Pain Goal: 1 ([id number])  Complications: No apparent anesthesia complications

## 2016-07-14 NOTE — H&P (Signed)
The recent History & Physical has been reviewed. I have personally examined the patient today. There is no interval change to the documented History & Physical. The patient would like to proceed with the procedure.  Garald Balding 07/14/2016,  1:30 PM

## 2016-07-14 NOTE — Op Note (Signed)
PATIENT ID:      [name]  MRN:     [id number] DOB/AGE:    Dec 19, 1970 / 46 y.o.       OPERATIVE REPORT    DATE OF PROCEDURE:  07/14/2016       PREOPERATIVE DIAGNOSIS:   RIGHT SHOULDER IMPINGEMENT SYNDROME, ROTATOR CUFF TEAR                                                       Estimated body mass index is 34.54 kg/m as calculated from the following:   Height as of this encounter: 5\' 6"  (1.676 m).   Weight as of this encounter: 214 lb (97.1 kg).     POSTOPERATIVE DIAGNOSIS:   RIGHT SHOULDER IMPINGEMENT SYNDROME, ROTATOR CUFF TEAR ,DEGENERATIVE A-C JOINT                                                                    Estimated body mass index is 34.54 kg/m as calculated from the following:   Height as of this encounter: 5\' 6"  (1.676 m).   Weight as of this encounter: 214 lb (97.1 kg).     PROCEDURE:  Procedure(s): RIGHT SHOULDER ARTHROSCOPY WITH SUBACROMIAL DECOMPRESSION AND OPEN ROTATOR CUFF REPAIR, OPEN BICEP TENDON REPAIR      SURGEON:  Joni Fears, MD    ASSISTANT:   Biagio Borg, PA-C   (Present and scrubbed throughout the case, critical for assistance with exposure, retraction, instrumentation, and closure.)          ANESTHESIA: regional and general     DRAINS: none :      TOURNIQUET TIME: * No tourniquets in log *   COMPLICATIONS:  None   CONDITION:  stable  PROCEDURE IN DETAIL: [place] 07/14/2016, 3:26 PM

## 2016-07-14 NOTE — Discharge Instructions (Signed)
Call your surgeon if you experience:   1.  Fever over 101.0. 2.  Inability to urinate. 3.  Nausea and/or vomiting. 4.  Extreme swelling or bruising at the surgical site. 5.  Continued bleeding from the incision. 6.  Increased pain, redness or drainage from the incision. 7.  Problems related to your pain medication. 8.  Any problems and/or concerns   Post Anesthesia Home Care Instructions  Activity: Get plenty of rest for the remainder of the day. A responsible adult should stay with you for 24 hours following the procedure.  For the next 24 hours, DO NOT: -Drive a car -Paediatric nurse -Drink alcoholic beverages -Take any medication unless instructed by your physician -Make any legal decisions or sign important papers.  Meals: Start with liquid foods such as gelatin or soup. Progress to regular foods as tolerated. Avoid greasy, spicy, heavy foods. If nausea and/or vomiting occur, drink only clear liquids until the nausea and/or vomiting subsides. Call your physician if vomiting continues.  Special Instructions/Symptoms: Your throat may feel dry or sore from the anesthesia or the breathing tube placed in your throat during surgery. If this causes discomfort, gargle with warm salt water. The discomfort should disappear within 24 hours.  If you had a scopolamine patch placed behind your ear for the management of post- operative nausea and/or vomiting:  1. The medication in the patch is effective for 72 hours, after which it should be removed.  Wrap patch in a tissue and discard in the trash. Wash hands thoroughly with soap and water. 2. You may remove the patch earlier than 72 hours if you experience unpleasant side effects which may include dry mouth, dizziness or visual disturbances. 3. Avoid touching the patch. Wash your hands with soap and water after contact with the patch.    Regional Anesthesia Blocks  1. Numbness or the inability to move the "blocked" extremity may last  from 3-48 hours after placement. The length of time depends on the medication injected and your individual response to the medication. If the numbness is not going away after 48 hours, call your surgeon.  2. The extremity that is blocked will need to be protected until the numbness is gone and the  Strength has returned. Because you cannot feel it, you will need to take extra care to avoid injury. Because it may be weak, you may have difficulty moving it or using it. You may not know what position it is in without looking at it while the block is in effect.  3. For blocks in the legs and feet, returning to weight bearing and walking needs to be done carefully. You will need to wait until the numbness is entirely gone and the strength has returned. You should be able to move your leg and foot normally before you try and bear weight or walk. You will need someone to be with you when you first try to ensure you do not fall and possibly risk injury.  4. Bruising and tenderness at the needle site are common side effects and will resolve in a few days.  5. Persistent numbness or new problems with movement should be communicated to the surgeon or the [place] [phone] [place] [phone]).

## 2016-07-14 NOTE — Progress Notes (Signed)
Assisted Dr. Hodierne with right, ultrasound guided, interscalene  block. Side rails up, monitors on throughout procedure. See vital signs in flow sheet. Tolerated Procedure well. 

## 2016-07-15 ENCOUNTER — Encounter (HOSPITAL_BASED_OUTPATIENT_CLINIC_OR_DEPARTMENT_OTHER): Payer: Self-pay | Admitting: Orthopaedic Surgery

## 2016-07-15 NOTE — Op Note (Signed)
NAMEKAEDANCE, Stephanie NO.:  [id number]  MEDICAL RECORD NO.:  [id number]  LOCATION:                                 FACILITY:  PHYSICIAN:  Vonna Kotyk. Whitfield, M.D.DATE OF BIRTH:  01/18/1971  DATE OF PROCEDURE:  07/14/2016 DATE OF DISCHARGE:                              OPERATIVE REPORT   PREOPERATIVE DIAGNOSIS:  Rotator cuff tear, right shoulder with impingement.  POSTOPERATIVE DIAGNOSIS:  Rotator cuff tear, right shoulder with impingement with degenerative acromioclavicular joint.  PROCEDURES: 1. Diagnostic arthroscopy, right shoulder with debridement of partial     rotator cuff tearing. 2. Orthoscopic subacromial decompression. 3. Arthroscopic distal clavicle resection. 4. Mini open rotator cuff tear repair.  SURGEON:  Vonna Kotyk. Durward Fortes, M.D.  ASSISTANT:  Aaron Edelman D. Petrarca, PA-C.  ANESTHESIA:  General with supplemental interscalene nerve block.  COMPLICATIONS:  None.  HISTORY:  A 46 year old female was placing the right arm through a sleeve while getting dressed and felt a pop in her right shoulder approximately 8 days ago.  She has had considerable pain to the point of sleep compromise and activity compromise.  She has been unable to raise her arm over her head.  MRI scan was performed demonstrating a complete tear of the supraspinatus tendon with 19 mm of retraction and moderate- to-severe tendinosis of the infraspinatus tendon at its insertion with a small partial-thickness bursal surface tear.  Teres minor and subscapularis tendons were intact.  Biceps long head was intact.  There was no atrophy or fatty replacement of any of the muscles.  The [facility] joint appeared to be normal.  It was a type 1 acromion.  No joint effusion or chondral defects in the glenohumeral joint.  She is now to have an arthroscopic evaluation.  DESCRIPTION OF PROCEDURE:  Ms. Pickron was met with the family in the holding area, identified the right shoulder as appropriate  operative site and marked it accordingly.  Anesthesia performed an interscalene nerve block.  The patient was then transported to room #1 and placed under general anesthesia without difficulty.  The patient was placed in a semi-sitting position with a shoulder frame.  The right shoulder was then examined under anesthesia without evidence of instability or adhesive capsulitis.  The right shoulder was prepped with chlorhexidine from the base of the neck circumferentially below the elbow.  Sterile draping was performed. Time-out was called.  A marking pen was used to outline the [facility] joint at the coracoid and the acromion.  At a point of fingerbreadth posterior and medial to the posterior angle acromion, a small stab wound was made.  The arthroscope was then easily placed into the shoulder joint.  Diagnostic arthroscopy revealed no evidence of loose material.  The labrum appeared to be intact.  I did not see any appreciable chondromalacia of the glenoid or the humeral head.  Subscapularis was intact as was the biceps.  There was an obvious full-thickness tear of the supraspinatus and at least a partial tear of the infraspinatus.  I could visualize the subacromial space through the retracted rotator cuff tear.  I did establish a second portal anteriorly with a rib cannula and debrided any of the partial tearing.  The arthroscope was then placed in subacromial space posteriorly, a cannula in the subacromial space anteriorly, and a third portal established in the lateral subacromial space.  An arthroscopic subacromial decompression was performed.  There was considerable inflammatory bursal tissue, which was resected with a Cuda shaver and the ArthroCare wand.  There was a type 1 acromion, but quite thick.  I performed an anterior-inferior acromioplasty.  There were some areas of inflammatory synovitis about the [facility] joint and there was some overhang inferiorly at the mid distal clavicle and  at the acromion, so I performed a distal clavicle resection with the same 6 mm bur with a nice flat resection.  The arthroscopic equipment was then removed.  I applied new gloves and performed a mini open rotator cuff tear repair.  About an inch incision was made over the anterior aspect of the shoulder via sharp dissection, carried down to the subcutaneous tissue.  A rhaphe in the deltoid fascia was identified and incised.  The subacromial space was entered.  Self-retaining retractor was inserted.  The rotator cuff tear was visualized.  It involved a V tear of this supraspinatus with one limb extending superomedially and the other superolaterally.  I sharply debrided the edges, placed it back in its normal anatomic position and inserted a single Biomet PEEK cage anchor with attached #2 FiberWire.  I supplemented this with a Quattro 4.5 mm anchor placed more distally.  The edges of the tear were sutured with 0 Vicryl suture.  A very nice repair without any evidence of impingement. There was some bubbling affect of the infraspinatus and given the appearance by arthroscopy and with an MRI scan demonstrating a high- grade tendinosis, I reefed the infraspinatus where there was "bubbling." This was performed with 0 Ethibond suture with a nice plication.  As I placed the arm through the range of motion, there was no impingement.  Wound was irrigated with saline solution.  The deltoid fascia closed with a running 0 Vicryl, subcu with 3-0 Monocryl, skin closed with Steri- Strips over benzoin.  Sterile bulky dressing was applied, followed by sling.  PLAN:  Oxycodone with Tylenol for pain.  Office first of the week.    Vonna Kotyk. Durward Fortes, M.D.   ______________________________ Vonna Kotyk. Durward Fortes, M.D.   PWW/MEDQ  D:  07/14/2016  T:  07/15/2016  Job:  [id number]

## 2016-07-15 NOTE — Anesthesia Postprocedure Evaluation (Signed)
Anesthesia Post Note  Patient: [name]  Procedure(s) Performed: Procedure(s) (LRB): RIGHT SHOULDER ARTHROSCOPY WITH SUBACROMIAL DECOMPRESSION, DISTAL CLAVICLE RESECTION AND MINI OPEN ROTATOR CUFF REPAIR, OPEN BICEP TENDODESIS (Right)  Patient location during evaluation: PACU Anesthesia Type: General Level of consciousness: awake and alert and patient cooperative Pain management: pain level controlled Vital Signs Assessment: post-procedure vital signs reviewed and stable Respiratory status: spontaneous breathing and respiratory function stable Cardiovascular status: stable Anesthetic complications: no       Last Vitals:  Vitals:   07/14/16 1709 07/14/16 1715  BP:  138/85  Pulse: 91 86  Resp: 18 16  Temp:  36.4 C    Last Pain:  Vitals:   07/15/16 1027  TempSrc:   PainSc: 2                  Monica Codd S

## 2016-07-18 ENCOUNTER — Encounter (INDEPENDENT_AMBULATORY_CARE_PROVIDER_SITE_OTHER): Payer: Self-pay | Admitting: Orthopaedic Surgery

## 2016-07-18 ENCOUNTER — Ambulatory Visit (INDEPENDENT_AMBULATORY_CARE_PROVIDER_SITE_OTHER): Payer: 59 | Admitting: Orthopaedic Surgery

## 2016-07-18 VITALS — BP 136/92 | HR 70 | Ht 66.0 in | Wt 214.0 lb

## 2016-07-18 DIAGNOSIS — M25511 Pain in right shoulder: Secondary | ICD-10-CM

## 2016-07-18 NOTE — Progress Notes (Signed)
Office Visit Note   Patient: [name]           Date of Birth: [birth year]           MRN: [id number] Visit Date: 07/18/2016              Requested by: Crecencio Mc, MD [place], [place] [address] PCP: Crecencio Mc, MD   Assessment & Plan: Visit Diagnoses: 4 days status post rotator cuff tear repair right shoulder-doing well without complications.  Plan: Redress the wounds, continue with the sling and pain medicines. She'll need a note that she can return to work half days and no use several right upper extremity. Plan to see her back in 1 week. Consider physical therapy when she returns.  Follow-Up Instructions: No Follow-up on file.   Orders:  No orders of the defined types were placed in this encounter.  No orders of the defined types were placed in this encounter.     Procedures: No procedures performed   Clinical Data: No additional findings.   Subjective: No chief complaint on file.   4 days status post Right SHOULDER ARTHROSCOPY WITH SUBACROMIAL DECOMPRESSION AND OPEN ROTATOR CUFF REPAIR, OPEN BICEP TENDON REPAIR    Review of Systems   Objective: Vital Signs: There were no vitals taken for this visit.  Physical Exam  Ortho Exam right shoulder exam reveals the wounds to be healing nicely. I didt replace the wounds with new Steri-Strips. Neurovascular exam is intact.  No specialty comments available.  Imaging: No results found.   PMFS History: Patient Active Problem List   Diagnosis Date Noted  . Nontraumatic incomplete tear of right rotator cuff 07/14/2016  . AC (acromioclavicular) arthritis 07/14/2016  . Impingement syndrome of right shoulder 07/14/2016  . Fibrocystic breast changes, right 05/21/2016  . Menopausal symptoms 10/30/2015  . Insomnia 10/16/2015  . Acute bronchitis 08/19/2015  . Snoring 07/26/2015  . Migraine without aura and without status migrainosus, not intractable 02/24/2015  . Muscle spasm  02/24/2015  . Facial paresthesia 02/24/2015  . Concussion with loss of consciousness 07/21/2014  . Hyperlipidemia associated with type 2 diabetes mellitus ([place]) 06/13/2014  . Pain in joint, shoulder region 08/11/2013  . Vitamin D deficiency 05/07/2013  . S/P Total Abdominal Hysterectomy and Left Salpingo-oophorectomy 10/20/2011  . Headache disorder   . Headache syndrome, complicated 99991111  . Uncontrolled type 2 diabetes mellitus ([place]) 07/20/2011  . Obesity (BMI 30-39.9) 07/20/2011  . Hypertension goal BP (blood pressure) < 130/80 07/20/2011   Past Medical History:  Diagnosis Date  . Allergy   . Anemia   . Anxiety    claustrophobic  . Asthma   . Diabetes mellitus 2011   did not start metforfin, losing weight  . Headache disorder   . Hypertension   . Shoulder impingement syndrome, right     Family History  Problem Relation Age of Onset  . Diabetes Mother   . Heart disease Mother   . Hypertension Mother   . Hyperlipidemia Mother   . Cancer Father 25    Lung Cancer  . Mental illness Sister     bipolar, substance abuse,  clean 2 yrs  . Hypertension Sister   . Cancer Paternal Grandmother 61    breast cancer  . Diabetes Son     Past Surgical History:  Procedure Laterality Date  . ABDOMINAL HYSTERECTOMY  2006   heavy menses, endometriosis, l oophrectomy  . BACK SURGERY    .  BREAST SURGERY     right breast x 2 , benign  . HERNIA REPAIR  2003   left inguinal   . LEFT OOPHORECTOMY    . SHOULDER ARTHROSCOPY WITH SUBACROMIAL DECOMPRESSION AND OPEN ROTATOR C Right 07/14/2016   Procedure: RIGHT SHOULDER ARTHROSCOPY WITH SUBACROMIAL DECOMPRESSION, DISTAL CLAVICLE RESECTION AND MINI OPEN ROTATOR CUFF REPAIR, OPEN BICEP TENDODESIS;  Surgeon: Garald Balding, MD;  Location: [place];  Service: Orthopedics;  Laterality: Right;  . TUBAL LIGATION     Social History   Occupational History  . Not on file.   Social History Main Topics  . Smoking status: Never  Smoker  . Smokeless tobacco: Never Used  . Alcohol use No  . Drug use: No  . Sexual activity: Not Currently    Partners: Male    Birth control/ protection: Surgical

## 2016-07-22 ENCOUNTER — Telehealth: Payer: Self-pay | Admitting: *Deleted

## 2016-07-22 ENCOUNTER — Encounter: Payer: Self-pay | Admitting: Physician Assistant

## 2016-07-22 ENCOUNTER — Ambulatory Visit (INDEPENDENT_AMBULATORY_CARE_PROVIDER_SITE_OTHER): Payer: 59 | Admitting: Physician Assistant

## 2016-07-22 VITALS — BP 125/84 | HR 76 | Ht 66.0 in | Wt 213.8 lb

## 2016-07-22 DIAGNOSIS — G43009 Migraine without aura, not intractable, without status migrainosus: Secondary | ICD-10-CM | POA: Diagnosis not present

## 2016-07-22 DIAGNOSIS — G4700 Insomnia, unspecified: Secondary | ICD-10-CM

## 2016-07-22 DIAGNOSIS — G43709 Chronic migraine without aura, not intractable, without status migrainosus: Secondary | ICD-10-CM

## 2016-07-22 DIAGNOSIS — T783XXA Angioneurotic edema, initial encounter: Secondary | ICD-10-CM | POA: Diagnosis not present

## 2016-07-22 DIAGNOSIS — IMO0002 Reserved for concepts with insufficient information to code with codable children: Secondary | ICD-10-CM

## 2016-07-22 MED ORDER — LOSARTAN POTASSIUM 25 MG PO TABS
25.0000 mg | ORAL_TABLET | Freq: Every day | ORAL | 0 refills | Status: DC
Start: 2016-07-22 — End: 2016-08-17

## 2016-07-22 NOTE — Telephone Encounter (Signed)
Patient has been notified

## 2016-07-22 NOTE — Telephone Encounter (Signed)
AGREE.  LOSARTAN 25 MG SENT TO [facility].  STOP LISINOPRIL AND START ONE TABLET DAILY.  INCRESAE TO TABLETS  DAILY AFTER ONE WEEK IF BP IS NOT AT GOAL 120/70

## 2016-07-22 NOTE — Telephone Encounter (Signed)
LOV: 05/18/16 Next: 08/18/15  Please advise on below.

## 2016-07-22 NOTE — Patient Instructions (Signed)

## 2016-07-22 NOTE — Progress Notes (Signed)
History:  [name] is a 46 y.o. [id number] who presents to clinic today for HA.  She had rotator cuff surgery last week and is using percocet.  Percocet makes her HAs better.  She did try the vivactil and found that it causes prolonged sedation.  She used it for 2 weeks straight and did not notice improved tolerance and therefore discontinued.   She has not worked nights in a long time.   She recognizes that was particularly bad for her HAs.   She grinds her teeth and plans to get a mouth guard for this.  Her PCP has advised a sleep study but she is not planning to pursue.  Other than when she takes percocet, the pain is always there- often a 2-3/10.    HIT6:62 Number of days in the last 4 weeks with:  Severe headache: 8 Moderate headache: 20 Mild headache: 0  No headache: 0   Past Medical History:  Diagnosis Date  . Allergy   . Anemia   . Anxiety    claustrophobic  . Asthma   . Diabetes mellitus 2011   did not start metforfin, losing weight  . Headache disorder   . Hypertension   . Shoulder impingement syndrome, right     Social History   Social History  . Marital status: Divorced    Spouse name: N/A  . Number of children: N/A  . Years of education: N/A   Occupational History  . Not on file.   Social History Main Topics  . Smoking status: Never Smoker  . Smokeless tobacco: Never Used  . Alcohol use No  . Drug use: No  . Sexual activity: Not Currently    Partners: Male    Birth control/ protection: Surgical   Other Topics Concern  . Not on file   Social History Narrative  . No narrative on file    Family History  Problem Relation Age of Onset  . Diabetes [name]   . Heart disease [name]   . Hypertension [name]   . Hyperlipidemia [name]   . Cancer [name] 53    Lung Cancer  . Mental illness [name]     bipolar, substance abuse,  clean 2 yrs  . Hypertension [name]   . Cancer [name] 65    breast cancer  . Diabetes [name]        Current  Outpatient Prescriptions on File Prior to Visit  Medication Sig Dispense Refill  . albuterol (PROVENTIL HFA;VENTOLIN HFA) 108 (90 BASE) MCG/ACT inhaler Inhale 2 puffs into the lungs every 6 (six) hours as needed for wheezing or shortness of breath. One puff as needed 3.7 g 1  . atorvastatin (LIPITOR) 20 MG tablet Take 1 tablet (20 mg total) by mouth daily. 90 tablet 3  . diazepam (VALIUM) 10 MG tablet Take 1 tablet (10 mg total) by mouth every 12 (twelve) hours as needed (muscle spasm). 30 tablet 1  . esomeprazole (NEXIUM) 20 MG capsule Take 20 mg by mouth daily at 12 noon.    . hydrochlorothiazide (HYDRODIURIL) 25 MG tablet Take 1 tablet (25 mg total) by mouth daily. 30 tablet 1  . ketorolac (TORADOL) 10 MG tablet Take 1 tablet (10 mg total) by mouth every 6 (six) hours as needed. 30 tablet 1  . lisinopril (PRINIVIL,ZESTRIL) 10 MG tablet Take 2 tablets (20 mg total) by mouth daily. 180 tablet 3  . meclizine (ANTIVERT) 25 MG tablet Take 25 mg by mouth 3 (three) times daily as  needed for dizziness.    . metFORMIN (GLUCOPHAGE) 500 MG tablet Take 1 tablet (500 mg total) by mouth 2 (two) times daily with a meal. 180 tablet 3  . metoprolol succinate (TOPROL-XL) 50 MG 24 hr tablet TAKE 1 TABLET BY MOUTH DAILY. TAKE WITH OR IMMEDIATELY FOLLOWING A MEAL. 90 tablet 1  . montelukast (SINGULAIR) 10 MG tablet Take 1 tablet (10 mg total) by mouth at bedtime. 30 tablet 3  . ondansetron (ZOFRAN) 4 MG tablet Take 1 tablet (4 mg total) by mouth every 8 (eight) hours as needed for nausea or vomiting. 10 tablet 0  . orphenadrine (NORFLEX) 100 MG tablet Take 1 tablet (100 mg total) by mouth 2 (two) times daily as needed. 60 tablet 1  . oxyCODONE-acetaminophen (ROXICET) 5-325 MG tablet Take 1-2 tablets by mouth every 4 (four) hours as needed for moderate pain or severe pain. 50 tablet 0  . protriptyline (VIVACTIL) 10 MG tablet Take 1 tablet (10 mg total) by mouth at bedtime. 30 tablet 2   No current  facility-administered medications on file prior to visit.      Review of Systems:  All pertinent positive/negative included in HPI, all other review of systems are negative   Objective:  Physical Exam BP 125/84   Pulse 76   Ht 5\' 6"  (1.676 m)   Wt 213 lb 12.8 oz (97 kg)   BMI 34.51 kg/m  CONSTITUTIONAL: Well-developed, well-nourished female in no acute distress.  EYES: EOM intact ENT: Normocephalic CARDIOVASCULAR: Regular rate   RESPIRATORY: Normal rate.  ENDOCRINE: Normal thyroid.  MUSCULOSKELETAL: Normal ROM.  Right arm in sling following r rotator cuff surgery one week ago SKIN: Warm, dry without erythema  NEUROLOGICAL: Alert, oriented, CN II-XII grossly intact, Appropriate balance PSYCH: Normal behavior, mood   Assessment & Plan:  Assessment: 1. Migraine without aura and without status migrainosus, not intractable   2. Insomnia, unspecified type   3. Chronic migraine      Plan: Due to allergies and side effects, pt is highly limited by options for pharmaceutical treatment.  I fear we have run out of options.  Treatment with narcotic/pain medications will need to be addressed by pain management specialist if that becomes necessary.   Patient strongly advised to manage lifestyle - maintain appropriate schedule, exercise daily, utilize massage, etc.   RTC PRN  Paticia Stack, PA-C 07/22/2016 11:14 AM

## 2016-07-22 NOTE — Telephone Encounter (Signed)
[facility] has requested to have pt's Lisinopril changed to [facility] contact [phone]

## 2016-07-25 DIAGNOSIS — J301 Allergic rhinitis due to pollen: Secondary | ICD-10-CM | POA: Diagnosis not present

## 2016-07-27 ENCOUNTER — Other Ambulatory Visit (INDEPENDENT_AMBULATORY_CARE_PROVIDER_SITE_OTHER): Payer: Self-pay

## 2016-07-27 ENCOUNTER — Encounter (INDEPENDENT_AMBULATORY_CARE_PROVIDER_SITE_OTHER): Payer: Self-pay | Admitting: Orthopedic Surgery

## 2016-07-27 ENCOUNTER — Ambulatory Visit (INDEPENDENT_AMBULATORY_CARE_PROVIDER_SITE_OTHER): Payer: 59 | Admitting: Orthopedic Surgery

## 2016-07-27 VITALS — BP 158/99 | HR 72 | Resp 16 | Ht 68.0 in | Wt 224.0 lb

## 2016-07-27 DIAGNOSIS — M75121 Complete rotator cuff tear or rupture of right shoulder, not specified as traumatic: Secondary | ICD-10-CM

## 2016-07-27 MED ORDER — OXYCODONE-ACETAMINOPHEN 5-325 MG PO TABS: 1.0000 | ORAL_TABLET | ORAL | 0 refills | Status: DC | PRN

## 2016-07-27 NOTE — Progress Notes (Signed)
Office Visit Note   Patient: [name]           Date of Birth: 01-Dec-1970           MRN: [id number] Visit Date: 07/27/2016              Requested by: Crecencio Mc, MD [address] [place], [place] [address] PCP: Crecencio Mc, MD   Assessment & Plan: Visit Diagnoses:  1. Complete tear of right rotator cuff     Plan:  #1: Begin physical therapy at [facility] for rotator cuff repair protocol #2: Prescription for Percocet was given #3: Remain out of work until seen in 3 weeks #4: Instructed in passive circumduction and pendulum exercises  Follow-Up Instructions: Return in about 3 weeks (around 08/17/2016).   Orders:  No orders of the defined types were placed in this encounter.  Meds ordered this encounter  Medications  . oxyCODONE-acetaminophen (ROXICET) 5-325 MG tablet    Sig: Take 1-2 tablets by mouth every 4 (four) hours as needed for severe pain.    Dispense:  60 tablet    Refill:  0    Order Specific Question:   Supervising Provider    Answer:   Garald Balding [id number]      Procedures: No procedures performed   Clinical Data: No additional findings.   Subjective: Chief Complaint  Patient presents with  . Right Shoulder - Routine Post Op    13 days status post rotator cuff tear repair right shoulder-doing well without complications.   Pt wants to start PT at [facility] when ready.. Last night was the first night she did not take any pain meds. She uses ice and elevation.  Denies any numbness in the extremity.     Review of Systems  Constitutional: Negative.   HENT: Negative.   Respiratory: Negative.   Cardiovascular: Negative.   Gastrointestinal: Negative.   Genitourinary: Negative.   Skin: Negative.   Neurological: Negative.   Hematological: Negative.   Psychiatric/Behavioral: Negative.      Objective: Vital Signs: BP (!) 158/99   Pulse 72   Resp 16   Ht 5\' 8"  (1.727 m)   Wt 224 lb (101.6 kg)   BMI 34.06 kg/m    Physical Exam  Right Shoulder Exam   Range of Motion  Passive Abduction: 20  Forward Flexion: 20 (Passive)  External Rotation: 10 (Passive)   Other  Scars: present (Healing per primam with no signs of infection.) Sensation: normal Pulse: present      Specialty Comments:  No specialty comments available.  Imaging: No results found.   PMFS History: Patient Active Problem List   Diagnosis Date Noted  . Nontraumatic incomplete tear of right rotator cuff 07/14/2016  . AC (acromioclavicular) arthritis 07/14/2016  . Impingement syndrome of right shoulder 07/14/2016  . Fibrocystic breast changes, right 05/21/2016  . Menopausal symptoms 10/30/2015  . Insomnia 10/16/2015  . Acute bronchitis 08/19/2015  . Snoring 07/26/2015  . Migraine without aura and without status migrainosus, not intractable 02/24/2015  . Muscle spasm 02/24/2015  . Facial paresthesia 02/24/2015  . Concussion with loss of consciousness 07/21/2014  . Hyperlipidemia associated with type 2 diabetes mellitus ([place]) 06/13/2014  . Pain in joint, shoulder region 08/11/2013  . Vitamin D deficiency 05/07/2013  . Chronic migraine 11/20/2012  . S/P Total Abdominal Hysterectomy and Left Salpingo-oophorectomy 10/20/2011  . Headache disorder   . Headache syndrome, complicated 99991111  . Uncontrolled type 2 diabetes mellitus ([place])  07/20/2011  . Obesity (BMI 30-39.9) 07/20/2011  . Hypertension goal BP (blood pressure) < 130/80 07/20/2011   Past Medical History:  Diagnosis Date  . Allergy   . Anemia   . Anxiety    claustrophobic  . Asthma   . Diabetes mellitus 2011   did not start metforfin, losing weight  . Headache disorder   . Hypertension   . Shoulder impingement syndrome, right     Family History  Problem Relation Age of Onset  . Diabetes Mother   . Heart disease Mother   . Hypertension Mother   . Hyperlipidemia Mother   . Cancer Father 36    Lung Cancer  . Mental illness Sister     bipolar,  substance abuse,  clean 2 yrs  . Hypertension Sister   . Cancer Paternal Grandmother 16    breast cancer  . Diabetes Son     Past Surgical History:  Procedure Laterality Date  . ABDOMINAL HYSTERECTOMY  2006   heavy menses, endometriosis, l oophrectomy  . BACK SURGERY    . BREAST SURGERY     right breast x 2 , benign  . HERNIA REPAIR  2003   left inguinal   . LEFT OOPHORECTOMY    . SHOULDER ARTHROSCOPY WITH SUBACROMIAL DECOMPRESSION AND OPEN ROTATOR C Right 07/14/2016   Procedure: RIGHT SHOULDER ARTHROSCOPY WITH SUBACROMIAL DECOMPRESSION, DISTAL CLAVICLE RESECTION AND MINI OPEN ROTATOR CUFF REPAIR, OPEN BICEP TENDODESIS;  Surgeon: Garald Balding, MD;  Location: [place];  Service: Orthopedics;  Laterality: Right;  . TUBAL LIGATION     Social History   Occupational History  . Not on file.   Social History Main Topics  . Smoking status: Never Smoker  . Smokeless tobacco: Never Used  . Alcohol use No  . Drug use: No  . Sexual activity: Not Currently    Partners: Male    Birth control/ protection: Surgical

## 2016-07-28 ENCOUNTER — Telehealth (INDEPENDENT_AMBULATORY_CARE_PROVIDER_SITE_OTHER): Payer: Self-pay | Admitting: Orthopaedic Surgery

## 2016-07-28 DIAGNOSIS — M25511 Pain in right shoulder: Secondary | ICD-10-CM | POA: Diagnosis not present

## 2016-07-28 DIAGNOSIS — M6281 Muscle weakness (generalized): Secondary | ICD-10-CM | POA: Diagnosis not present

## 2016-07-28 NOTE — Telephone Encounter (Signed)
[facility] is requesting most recent office note on patient along with surgery note.   Please fax to [phone]

## 2016-08-01 DIAGNOSIS — M6281 Muscle weakness (generalized): Secondary | ICD-10-CM | POA: Diagnosis not present

## 2016-08-01 DIAGNOSIS — M25511 Pain in right shoulder: Secondary | ICD-10-CM | POA: Diagnosis not present

## 2016-08-03 ENCOUNTER — Telehealth: Payer: Self-pay | Admitting: Internal Medicine

## 2016-08-03 DIAGNOSIS — M25511 Pain in right shoulder: Secondary | ICD-10-CM | POA: Diagnosis not present

## 2016-08-03 DIAGNOSIS — M6281 Muscle weakness (generalized): Secondary | ICD-10-CM | POA: Diagnosis not present

## 2016-08-03 NOTE — Telephone Encounter (Signed)
error 

## 2016-08-08 DIAGNOSIS — M25511 Pain in right shoulder: Secondary | ICD-10-CM | POA: Diagnosis not present

## 2016-08-08 DIAGNOSIS — M6281 Muscle weakness (generalized): Secondary | ICD-10-CM | POA: Diagnosis not present

## 2016-08-10 DIAGNOSIS — M6281 Muscle weakness (generalized): Secondary | ICD-10-CM | POA: Diagnosis not present

## 2016-08-10 DIAGNOSIS — M25511 Pain in right shoulder: Secondary | ICD-10-CM | POA: Diagnosis not present

## 2016-08-17 ENCOUNTER — Encounter: Payer: Self-pay | Admitting: Internal Medicine

## 2016-08-17 ENCOUNTER — Ambulatory Visit (INDEPENDENT_AMBULATORY_CARE_PROVIDER_SITE_OTHER): Payer: 59 | Admitting: Internal Medicine

## 2016-08-17 VITALS — BP 128/82 | HR 60 | Resp 16 | Ht 68.0 in | Wt 214.6 lb

## 2016-08-17 DIAGNOSIS — E785 Hyperlipidemia, unspecified: Secondary | ICD-10-CM | POA: Diagnosis not present

## 2016-08-17 DIAGNOSIS — E1165 Type 2 diabetes mellitus with hyperglycemia: Secondary | ICD-10-CM

## 2016-08-17 DIAGNOSIS — M75111 Incomplete rotator cuff tear or rupture of right shoulder, not specified as traumatic: Secondary | ICD-10-CM | POA: Diagnosis not present

## 2016-08-17 DIAGNOSIS — E1169 Type 2 diabetes mellitus with other specified complication: Secondary | ICD-10-CM

## 2016-08-17 DIAGNOSIS — M25511 Pain in right shoulder: Secondary | ICD-10-CM | POA: Diagnosis not present

## 2016-08-17 DIAGNOSIS — E1121 Type 2 diabetes mellitus with diabetic nephropathy: Secondary | ICD-10-CM | POA: Diagnosis not present

## 2016-08-17 DIAGNOSIS — R51 Headache: Secondary | ICD-10-CM | POA: Diagnosis not present

## 2016-08-17 DIAGNOSIS — M6281 Muscle weakness (generalized): Secondary | ICD-10-CM | POA: Diagnosis not present

## 2016-08-17 DIAGNOSIS — IMO0002 Reserved for concepts with insufficient information to code with codable children: Secondary | ICD-10-CM

## 2016-08-17 DIAGNOSIS — R519 Headache, unspecified: Secondary | ICD-10-CM

## 2016-08-17 MED ORDER — LOSARTAN POTASSIUM-HCTZ 50-12.5 MG PO TABS: 1.0000 | ORAL_TABLET | Freq: Every day | ORAL | 3 refills | Status: DC

## 2016-08-17 NOTE — Progress Notes (Signed)
Subjective:  Patient ID: [name], female    DOB: 26-Apr-1971  Age: 46 y.o. MRN: [id number]  CC: The primary encounter diagnosis was Uncontrolled type 2 diabetes mellitus with diabetic nephropathy, without long-term current use of insulin ([place]). Diagnoses of Hyperlipidemia associated with type 2 diabetes mellitus ([place]), Nontraumatic incomplete tear of right rotator cuff, and Headache disorder were also pertinent to this visit.  HPI [name] presents for FOLLOW UP ON TYPE 2 DM UNCONTROLLED BY LAST CHECK IN December  METFORMIN AND SIMVASTATIN STARTED AFTER LAST VISIT .  STOPPED METFORMIN AFTER ONE DOSE DUE TO INTOLERANCE.   Has been restricting her carbohydrates and achieved a weight loss of 10 LBS PRIOR TO shoulder SURGERY  CHECKS SUGARS occasionally,  RARELY FASTING 112 AND 92  POST PRANDIALS 118 104     Lab Results  Component Value Date   HGBA1C 10.0 (H) 08/17/2016   8 LB WT LOSS SINCE DEC 13  RIGHT ROTATOR CUFF tear occurred during the act of dressing.  Hear and felt it tear while PUTTING ON A SHIRT.  PAIN BECAME SEVERE,  SAW ORTHO  same day SHOULDER  X RAYS ABNL mri CONFIRMED  COMPLETE TEAR of ROTATOR CUFF SURGERY was done on FEB 8 BY [facility].    CURRENTLY COMPLETING PT   ARM IN SLING,  GETTING MUSCLE SPASMs.  Using valium 10 g q 12 hr. Getting  PT  MON AND WED    USING IBUPROFEN 800 MG ONCE DAILY     Outpatient Medications Prior to Visit  Medication Sig Dispense Refill  . albuterol (PROVENTIL HFA;VENTOLIN HFA) 108 (90 BASE) MCG/ACT inhaler Inhale 2 puffs into the lungs every 6 (six) hours as needed for wheezing or shortness of breath. One puff as needed 3.7 g 1  . atorvastatin (LIPITOR) 20 MG tablet Take 1 tablet (20 mg total) by mouth daily. 90 tablet 3  . diazepam (VALIUM) 10 MG tablet Take 1 tablet (10 mg total) by mouth every 12 (twelve) hours as needed (muscle spasm). 30 tablet 1  . EPINEPHrine 0.3 mg/0.3 mL IJ SOAJ injection   3  . esomeprazole (NEXIUM) 20  MG capsule Take 20 mg by mouth daily at 12 noon.    [name] ketorolac (TORADOL) 10 MG tablet Take 1 tablet (10 mg total) by mouth every 6 (six) hours as needed. 30 tablet 1  . meclizine (ANTIVERT) 25 MG tablet Take 25 mg by mouth 3 (three) times daily as needed for dizziness.    . metoprolol succinate (TOPROL-XL) 50 MG 24 hr tablet TAKE 1 TABLET BY MOUTH DAILY. TAKE WITH OR IMMEDIATELY FOLLOWING A MEAL. 90 tablet 1  . montelukast (SINGULAIR) 10 MG tablet Take 1 tablet (10 mg total) by mouth at bedtime. 30 tablet 3  . ondansetron (ZOFRAN) 4 MG tablet Take 1 tablet (4 mg total) by mouth every 8 (eight) hours as needed for nausea or vomiting. 10 tablet 0  . orphenadrine (NORFLEX) 100 MG tablet Take 1 tablet (100 mg total) by mouth 2 (two) times daily as needed. 60 tablet 1  . oxyCODONE-acetaminophen (ROXICET) 5-325 MG tablet Take 1-2 tablets by mouth every 4 (four) hours as needed for moderate pain or severe pain. 50 tablet 0  . oxyCODONE-acetaminophen (ROXICET) 5-325 MG tablet Take 1-2 tablets by mouth every 4 (four) hours as needed for severe pain. 60 tablet 0  . hydrochlorothiazide (HYDRODIURIL) 25 MG tablet Take 1 tablet (25 mg total) by mouth daily. 30 tablet 1  . losartan (COZAAR)  25 MG tablet Take 1 tablet (25 mg total) by mouth daily. 90 tablet 0  . metFORMIN (GLUCOPHAGE) 500 MG tablet Take 1 tablet (500 mg total) by mouth 2 (two) times daily with a meal. (Patient not taking: Reported on 08/17/2016) 180 tablet 3   No facility-administered medications prior to visit.     Review of Systems;  Patient denies headache, fevers, malaise, unintentional weight loss, skin rash, eye pain, sinus congestion and sinus pain, sore throat, dysphagia,  hemoptysis , cough, dyspnea, wheezing, chest pain, palpitations, orthopnea, edema, abdominal pain, nausea, melena, diarrhea, constipation, flank pain, dysuria, hematuria, urinary  Frequency, nocturia, numbness, tingling, seizures,  Focal weakness, Loss of consciousness,   Tremor, insomnia, depression, anxiety, and suicidal ideation.      Objective:  BP 128/82 (BP Location: Left Arm, Patient Position: Sitting, Cuff Size: Large)   Pulse 60   Resp 16   Ht 5\' 8"  (1.727 m)   Wt 214 lb 9.6 oz (97.3 kg)   SpO2 100%   BMI 32.63 kg/m   BP Readings from Last 3 Encounters:  08/17/16 128/82  07/27/16 (!) 158/99  07/22/16 125/84    Wt Readings from Last 3 Encounters:  08/17/16 214 lb 9.6 oz (97.3 kg)  07/27/16 224 lb (101.6 kg)  07/22/16 213 lb 12.8 oz (97 kg)    General appearance: alert, cooperative and appears stated age Ears: normal TM's and external ear canals both ears Throat: lips, mucosa, and tongue normal; teeth and gums normal Neck: no adenopathy, no carotid bruit, supple, symmetrical, trachea midline and thyroid not enlarged, symmetric, no tenderness/mass/nodules Back: symmetric, no curvature. ROM normal. No CVA tenderness. Lungs: clear to auscultation bilaterally Heart: regular rate and rhythm, S1, S2 normal, no murmur, click, rub or gallop Abdomen: soft, non-tender; bowel sounds normal; no masses,  no organomegaly Pulses: 2+ and symmetric Skin: Skin color, texture, turgor normal. No rashes or lesions Lymph nodes: Cervical, supraclavicular, and axillary nodes normal.  Lab Results  Component Value Date   HGBA1C 10.0 (H) 08/17/2016   HGBA1C 8.4 (H) 05/18/2016   HGBA1C 6.6 (H) 07/24/2015    Lab Results  Component Value Date   CREATININE 0.89 08/17/2016   CREATININE 0.93 07/11/2016   CREATININE 0.96 05/18/2016    Lab Results  Component Value Date   WBC 7.7 10/04/2015   HGB 13.5 10/04/2015   HCT 39.8 10/04/2015   PLT 173 10/04/2015   GLUCOSE 219 (H) 08/17/2016   CHOL 160 08/17/2016   TRIG 132.0 08/17/2016   HDL 38.80 (L) 08/17/2016   LDLDIRECT 98.0 08/17/2016   LDLCALC 95 08/17/2016   ALT 34 08/17/2016   AST 23 08/17/2016   NA 137 08/17/2016   K 3.9 08/17/2016   CL 99 08/17/2016   CREATININE 0.89 08/17/2016   BUN 17  08/17/2016   CO2 25 08/17/2016   TSH 0.619 10/16/2014   HGBA1C 10.0 (H) 08/17/2016   MICROALBUR 2.4 (H) 05/18/2016    No results found.  Assessment & Plan:   Problem List Items Addressed This Visit    Headache disorder    Managed by neurology.      Hyperlipidemia associated with type 2 diabetes mellitus ([place])    LDL is at goal on current atorvastatin dose   LFTS are normal. Repeat both in 6 months  Lab Results  Component Value Date   CHOL 160 08/17/2016   HDL 38.80 (L) 08/17/2016   LDLCALC 95 08/17/2016   LDLDIRECT 98.0 08/17/2016   TRIG 132.0 08/17/2016  CHOLHDL 4 08/17/2016   Lab Results  Component Value Date   ALT 34 08/17/2016   AST 23 08/17/2016   ALKPHOS 89 08/17/2016   BILITOT 0.5 08/17/2016         Relevant Medications   losartan-hydrochlorothiazide (HYZAAR) 50-12.5 MG tablet   liraglutide 18 MG/3ML SOPN   Other Relevant Orders   LDL cholesterol, direct (Completed)   Lipid panel (Completed)   Nontraumatic incomplete tear of right rotator cuff    s/p arthroscopic surgery Feb 8 by Durward Fortes.       Uncontrolled type 2 diabetes mellitus ([place]) - Primary    Did not tolerate metformin due to nausea after first dose.  Following a low carb diet and reports that her blood sugars are all in line,  But her a1c suggests otherwise.  She will need to start insulin or victoza.  Given her desire to lose weight,  victoza will be recommended,   Lab Results  Component Value Date   HGBA1C 10.0 (H) 08/17/2016         Relevant Medications   losartan-hydrochlorothiazide (HYZAAR) 50-12.5 MG tablet   liraglutide 18 MG/3ML SOPN   Other Relevant Orders   Comprehensive metabolic panel (Completed)   Hemoglobin A1c (Completed)      I have discontinued Ms. Garrels's hydrochlorothiazide, metFORMIN, and losartan. I am also having her start on losartan-hydrochlorothiazide and liraglutide. Additionally, I am having her maintain her meclizine, albuterol, diazepam, orphenadrine,  ondansetron, metoprolol succinate, ketorolac, montelukast, atorvastatin, esomeprazole, oxyCODONE-acetaminophen, EPINEPHrine, and oxyCODONE-acetaminophen.  Meds ordered this encounter  Medications  . losartan-hydrochlorothiazide (HYZAAR) 50-12.5 MG tablet    Sig: Take 1 tablet by mouth daily.    Dispense:  90 tablet    Refill:  3  . liraglutide 18 MG/3ML SOPN    Sig: 0.6 mg  SubQ  once daily for 1 week;  increase to 1.2 mg once daily;  may increase  to 1.8 mg once daily    Dispense:  9 mL    Refill:  2    Medications Discontinued During This Encounter  Medication Reason  . hydrochlorothiazide (HYDRODIURIL) 25 MG tablet   . losartan (COZAAR) 25 MG tablet   . metFORMIN (GLUCOPHAGE) 500 MG tablet Side effect (s)    Follow-up: Return in about 3 months (around 11/17/2016).   Crecencio Mc, MD

## 2016-08-17 NOTE — Progress Notes (Signed)
Pre visit review using our clinic review tool, if applicable. No additional management support is needed unless otherwise documented below in the visit note. 

## 2016-08-17 NOTE — Patient Instructions (Signed)
NO NEED TO RESUME THE METFORMIN.  IF YOUR A1C IS > 7.0,  WE CAN DISCUSS OTHER OPTIONS FOR DIABETES MANAGEMENT, INCLUDING SAXENDA WHICH WILL ALSO HELP YOU LOSE WEIGHT

## 2016-08-18 ENCOUNTER — Encounter: Payer: Self-pay | Admitting: Internal Medicine

## 2016-08-18 LAB — COMPREHENSIVE METABOLIC PANEL
ALT: 34 U/L (ref 0–35)
AST: 23 U/L (ref 0–37)
Albumin: 4.7 g/dL (ref 3.5–5.2)
Alkaline Phosphatase: 89 U/L (ref 39–117)
BUN: 17 mg/dL (ref 6–23)
CO2: 25 mEq/L (ref 19–32)
Calcium: 10.3 mg/dL (ref 8.4–10.5)
Chloride: 99 mEq/L (ref 96–112)
Creatinine, Ser: 0.89 mg/dL (ref 0.40–1.20)
GFR: 87.41 mL/min (ref >60.00)
Glucose, Bld: 219 mg/dL — ABNORMAL HIGH (ref 70–99)
Potassium: 3.9 mEq/L (ref 3.5–5.1)
Sodium: 137 mEq/L (ref 135–145)
Total Bilirubin: 0.5 mg/dL (ref 0.2–1.2)
Total Protein: 8.1 g/dL (ref 6.0–8.3)

## 2016-08-18 LAB — LIPID PANEL
Cholesterol: 160 mg/dL (ref 0–200)
HDL: 38.8 mg/dL — ABNORMAL LOW (ref >39.00)
LDL Cholesterol: 95 mg/dL (ref 0–99)
NonHDL: 121.28
Total CHOL/HDL Ratio: 4
Triglycerides: 132 mg/dL (ref 0.0–149.0)
VLDL: 26.4 mg/dL (ref 0.0–40.0)

## 2016-08-18 LAB — LDL CHOLESTEROL, DIRECT: Direct LDL: 98 mg/dL

## 2016-08-18 LAB — HEMOGLOBIN A1C: Hgb A1c MFr Bld: 10 % — ABNORMAL HIGH (ref 4.6–6.5)

## 2016-08-18 MED ORDER — LIRAGLUTIDE 18 MG/3ML ~~LOC~~ SOPN: PEN_INJECTOR | SUBCUTANEOUS | 2 refills | Status: DC

## 2016-08-18 NOTE — Assessment & Plan Note (Signed)
Did not tolerate metformin due to nausea after first dose.  Following a low carb diet and reports that her blood sugars are all in line,  But her a1c suggests otherwise.  She will need to start insulin or victoza.  Given her desire to lose weight,  victoza will be recommended,   Lab Results  Component Value Date   HGBA1C 10.0 (H) 08/17/2016

## 2016-08-18 NOTE — Assessment & Plan Note (Signed)
s/p arthroscopic surgery Feb 8 by Durward Fortes.

## 2016-08-18 NOTE — Assessment & Plan Note (Signed)
Managed by neurology

## 2016-08-18 NOTE — Assessment & Plan Note (Signed)
LDL is at goal on current atorvastatin dose   LFTS are normal. Repeat both in 6 months  Lab Results  Component Value Date   CHOL 160 08/17/2016   HDL 38.80 (L) 08/17/2016   LDLCALC 95 08/17/2016   LDLDIRECT 98.0 08/17/2016   TRIG 132.0 08/17/2016   CHOLHDL 4 08/17/2016   Lab Results  Component Value Date   ALT 34 08/17/2016   AST 23 08/17/2016   ALKPHOS 89 08/17/2016   BILITOT 0.5 08/17/2016

## 2016-08-19 ENCOUNTER — Ambulatory Visit (INDEPENDENT_AMBULATORY_CARE_PROVIDER_SITE_OTHER): Payer: 59 | Admitting: Orthopaedic Surgery

## 2016-08-19 ENCOUNTER — Encounter (INDEPENDENT_AMBULATORY_CARE_PROVIDER_SITE_OTHER): Payer: Self-pay | Admitting: Orthopaedic Surgery

## 2016-08-19 VITALS — BP 127/93 | HR 70 | Resp 14 | Ht 68.0 in | Wt 214.6 lb

## 2016-08-19 DIAGNOSIS — M25511 Pain in right shoulder: Secondary | ICD-10-CM | POA: Diagnosis not present

## 2016-08-19 DIAGNOSIS — M6281 Muscle weakness (generalized): Secondary | ICD-10-CM | POA: Diagnosis not present

## 2016-08-19 DIAGNOSIS — G8929 Other chronic pain: Secondary | ICD-10-CM

## 2016-08-19 NOTE — Progress Notes (Signed)
Office Visit Note   Patient: [name]           Date of Birth: 06-15-1970           MRN: [id number] Visit Date: 08/19/2016              Requested by: Crecencio Mc, MD [address] [place], [place] [address] PCP: Crecencio Mc, MD   Assessment & Plan: Visit Diagnoses: 5 weeks status post rotator cuff tear repair right shoulder with early adhesive capsulitis  Plan: Return to work on March 21 using the sling with limited.activity. Continue with physical therapy and aggressive at home exercises which I have outlined to her in detail. Office 2 weeks  Follow-Up Instructions: No Follow-up on file.   Orders:  No orders of the defined types were placed in this encounter.  No orders of the defined types were placed in this encounter.     Procedures: No procedures performed   Clinical Data: No additional findings.   Subjective: No chief complaint on file.   [name] presents today with 5 weeks status post rotator cuff tear repair right shoulder-doing well without complications. She relates she is depressed over the lack of ROM with the right arm, she cannot move it over her head and still has problems with daily living activities. She states she is stressed about her work/money/disability paper work.  Her A1c is a 10.0 per her diabetes management dr, [facility] and pt  thinks her elevated A1c hinders her healing process.    Review of Systems   Objective: Vital Signs: There were no vitals taken for this visit.  Physical Exam  Ortho Exam early adhesive capsulitis right shoulder which may or may not be related to her diabetes. I can abduct to 90 and flex about 95 at which point she is quite tight . Incisions have healed without evidence of infection. Good grip and good release distally    Imaging: No results found.   PMFS History: Patient Active Problem List   Diagnosis Date Noted  . Nontraumatic incomplete tear of right rotator cuff  07/14/2016  . AC (acromioclavicular) arthritis 07/14/2016  . Impingement syndrome of right shoulder 07/14/2016  . Fibrocystic breast changes, right 05/21/2016  . Menopausal symptoms 10/30/2015  . Insomnia 10/16/2015  . Acute bronchitis 08/19/2015  . Snoring 07/26/2015  . Migraine without aura and without status migrainosus, not intractable 02/24/2015  . Muscle spasm 02/24/2015  . Facial paresthesia 02/24/2015  . Concussion with loss of consciousness 07/21/2014  . Hyperlipidemia associated with type 2 diabetes mellitus ([place]) 06/13/2014  . Pain in joint, shoulder region 08/11/2013  . Vitamin D deficiency 05/07/2013  . Chronic migraine 11/20/2012  . S/P Total Abdominal Hysterectomy and Left Salpingo-oophorectomy 10/20/2011  . Headache disorder   . Uncontrolled type 2 diabetes mellitus ([place]) 07/20/2011  . Obesity (BMI 30-39.9) 07/20/2011  . Hypertension goal BP (blood pressure) < 130/80 07/20/2011   Past Medical History:  Diagnosis Date  . Allergy   . Anemia   . Anxiety    claustrophobic  . Asthma   . Diabetes mellitus 2011   did not start metforfin, losing weight  . Headache disorder   . Hypertension   . Shoulder impingement syndrome, right     Family History  Problem Relation Age of Onset  . Diabetes Mother   . Heart disease Mother   . Hypertension Mother   . Hyperlipidemia Mother   . Cancer Father 32  Lung Cancer  . Mental illness Sister     bipolar, substance abuse,  clean 2 yrs  . Hypertension Sister   . Cancer Paternal Grandmother 85    breast cancer  . Diabetes Son     Past Surgical History:  Procedure Laterality Date  . ABDOMINAL HYSTERECTOMY  2006   heavy menses, endometriosis, l oophrectomy  . BACK SURGERY    . BREAST SURGERY     right breast x 2 , benign  . HERNIA REPAIR  2003   left inguinal   . LEFT OOPHORECTOMY    . SHOULDER ARTHROSCOPY WITH SUBACROMIAL DECOMPRESSION AND OPEN ROTATOR C Right 07/14/2016   Procedure: RIGHT SHOULDER ARTHROSCOPY WITH  SUBACROMIAL DECOMPRESSION, DISTAL CLAVICLE RESECTION AND MINI OPEN ROTATOR CUFF REPAIR, OPEN BICEP TENDODESIS;  Surgeon: Garald Balding, MD;  Location: [facility];  Service: Orthopedics;  Laterality: Right;  . TUBAL LIGATION     Social History   Occupational History  . Not on file.   Social History Main Topics  . Smoking status: Never Smoker  . Smokeless tobacco: Never Used  . Alcohol use No  . Drug use: No  . Sexual activity: Not Currently    Partners: Male    Birth control/ protection: Surgical

## 2016-08-23 ENCOUNTER — Telehealth (INDEPENDENT_AMBULATORY_CARE_PROVIDER_SITE_OTHER): Payer: Self-pay | Admitting: Orthopaedic Surgery

## 2016-08-23 NOTE — Telephone Encounter (Signed)
Mervyn Skeeters, EMT  Tammy Charna Elizabeth,  Ms. Diemer has had a hard time getting paid with this dx. She was in for a visit today and will return to work on 08/24/16.  Any help you can provide would be appreciated.  Thank you,  Jonathon Resides    Hubbard Robinson, unfortunately, it's her disability insurance that decides if the Dx supports her claim for being out of work.

## 2016-08-23 NOTE — Telephone Encounter (Signed)
OK - thanks

## 2016-08-24 ENCOUNTER — Other Ambulatory Visit (INDEPENDENT_AMBULATORY_CARE_PROVIDER_SITE_OTHER): Payer: Self-pay | Admitting: Orthopedic Surgery

## 2016-08-24 DIAGNOSIS — M25511 Pain in right shoulder: Secondary | ICD-10-CM | POA: Diagnosis not present

## 2016-08-24 DIAGNOSIS — M6281 Muscle weakness (generalized): Secondary | ICD-10-CM | POA: Diagnosis not present

## 2016-08-24 MED ORDER — METHOCARBAMOL 500 MG PO TABS: 500.0000 mg | ORAL_TABLET | Freq: Three times a day (TID) | ORAL | 0 refills | Status: DC | PRN

## 2016-08-25 ENCOUNTER — Ambulatory Visit (INDEPENDENT_AMBULATORY_CARE_PROVIDER_SITE_OTHER): Payer: 59 | Admitting: Orthopaedic Surgery

## 2016-08-25 ENCOUNTER — Encounter (INDEPENDENT_AMBULATORY_CARE_PROVIDER_SITE_OTHER): Payer: Self-pay | Admitting: Orthopaedic Surgery

## 2016-08-25 VITALS — BP 115/94 | HR 97 | Resp 14 | Ht 69.0 in | Wt 211.6 lb

## 2016-08-25 DIAGNOSIS — M75111 Incomplete rotator cuff tear or rupture of right shoulder, not specified as traumatic: Secondary | ICD-10-CM

## 2016-08-25 NOTE — Progress Notes (Signed)
Office Visit Note   Patient: [name]           Date of Birth: [birth year]           MRN: [id number] Visit Date: 08/25/2016              Requested by: Crecencio Mc, MD [place], [place] [address] PCP: Crecencio Mc, MD   Assessment & Plan: Visit Diagnoses: 6 weeks status post rotator cuff tear repair right shoulder with adhesive capsulitis. Possibly improving with continued physical therapy  Plan: Continue with physical therapy for 2 more weeks, continue working with present schedule. If no improvement in 2 weeks would consider closed manipulation for right shoulder  Follow-Up Instructions: No Follow-up on file.   Orders:  No orders of the defined types were placed in this encounter.  No orders of the defined types were placed in this encounter.     Procedures: No procedures performed   Clinical Data: No additional findings.   Subjective: Chief Complaint  Patient presents with  . Right Shoulder - Pain    [name] is a 46 year old female complaining of right shoulder pain that is swollen and painful. She has been this way for the last week. She started back to work full time and she is in pain and cannot take pain pills there as she has medicate in order to sleep.  No fever or chills, upper extremity swelling or neurologic deficits to right upper extremity. She feels like she might have slight improvement with continued physical therapy but is concerned about her motion and pain with attempted overhead movement.  Review of Systems   Objective: Vital Signs: BP (!) 115/94   Pulse 97   Wt 202 lb (91.6 kg)   BMI 30.71 kg/m   Physical Exam  Ortho Exam right shoulder incisions of healed nicely without evidence of infection. No erythema or ecchymosis. Passive abduction to 90 and flexion about 95-100.At That point patient is uncomfortable.    Imaging: No results found.   PMFS History: Patient Active Problem List   Diagnosis Date Noted  . Nontraumatic incomplete tear of right rotator cuff 07/14/2016  . AC (acromioclavicular) arthritis 07/14/2016  . Impingement syndrome of right shoulder 07/14/2016  . Fibrocystic breast changes, right 05/21/2016  . Menopausal symptoms 10/30/2015  . Insomnia 10/16/2015  . Acute bronchitis 08/19/2015  . Snoring 07/26/2015  . Migraine without aura and without status migrainosus, not intractable 02/24/2015  . Muscle spasm 02/24/2015  . Facial paresthesia 02/24/2015  . Concussion with loss of consciousness 07/21/2014  . Hyperlipidemia associated with type 2 diabetes mellitus ([place]) 06/13/2014  . Pain in joint, shoulder region 08/11/2013  . Vitamin D deficiency 05/07/2013  . Chronic migraine 11/20/2012  . S/P Total Abdominal Hysterectomy and Left Salpingo-oophorectomy 10/20/2011  . Headache disorder   . Uncontrolled type 2 diabetes mellitus ([place]) 07/20/2011  . Obesity (BMI 30-39.9) 07/20/2011  . Hypertension goal BP (blood pressure) < 130/80 07/20/2011   Past Medical History:  Diagnosis Date  . Allergy   . Anemia   . Anxiety    claustrophobic  . Asthma   . Diabetes mellitus 2011   did not start metforfin, losing weight  . Headache disorder   . Hypertension   . Shoulder impingement syndrome, right     Family History  Problem Relation Age of Onset  . Diabetes Mother   . Heart disease Mother   . Hypertension Mother   .  Hyperlipidemia Mother   . Cancer Father 55    Lung Cancer  . Mental illness Sister     bipolar, substance abuse,  clean 2 yrs  . Hypertension Sister   . Cancer Paternal Grandmother 21    breast cancer  . Diabetes Son     Past Surgical History:  Procedure Laterality Date  . ABDOMINAL HYSTERECTOMY  2006   heavy menses, endometriosis, l oophrectomy  . BACK SURGERY    . BREAST SURGERY     right breast x 2 , benign  . HERNIA REPAIR  2003   left inguinal   . LEFT OOPHORECTOMY    . SHOULDER ARTHROSCOPY WITH SUBACROMIAL DECOMPRESSION AND  OPEN ROTATOR C Right 07/14/2016   Procedure: RIGHT SHOULDER ARTHROSCOPY WITH SUBACROMIAL DECOMPRESSION, DISTAL CLAVICLE RESECTION AND MINI OPEN ROTATOR CUFF REPAIR, OPEN BICEP TENDODESIS;  Surgeon: Garald Balding, MD;  Location: [facility];  Service: Orthopedics;  Laterality: Right;  . TUBAL LIGATION     Social History   Occupational History  . Not on file.   Social History Main Topics  . Smoking status: Never Smoker  . Smokeless tobacco: Never Used  . Alcohol use No  . Drug use: No  . Sexual activity: Not Currently    Partners: Male    Birth control/ protection: Surgical

## 2016-08-26 ENCOUNTER — Encounter (INDEPENDENT_AMBULATORY_CARE_PROVIDER_SITE_OTHER): Payer: Self-pay

## 2016-08-26 ENCOUNTER — Encounter: Payer: Self-pay | Admitting: Radiology

## 2016-08-26 DIAGNOSIS — M6281 Muscle weakness (generalized): Secondary | ICD-10-CM | POA: Diagnosis not present

## 2016-08-26 DIAGNOSIS — M25511 Pain in right shoulder: Secondary | ICD-10-CM | POA: Diagnosis not present

## 2016-08-29 ENCOUNTER — Ambulatory Visit (INDEPENDENT_AMBULATORY_CARE_PROVIDER_SITE_OTHER): Payer: 59 | Admitting: Orthopaedic Surgery

## 2016-08-29 ENCOUNTER — Telehealth (INDEPENDENT_AMBULATORY_CARE_PROVIDER_SITE_OTHER): Payer: Self-pay | Admitting: Orthopaedic Surgery

## 2016-08-29 DIAGNOSIS — M25511 Pain in right shoulder: Secondary | ICD-10-CM | POA: Diagnosis not present

## 2016-08-29 DIAGNOSIS — M6281 Muscle weakness (generalized): Secondary | ICD-10-CM | POA: Diagnosis not present

## 2016-08-29 NOTE — Telephone Encounter (Signed)
Patient would like to know if she should go to physical therapy the day after her procedure. Please advise.

## 2016-08-30 NOTE — Telephone Encounter (Signed)
Spoke with Aaron Edelman and he said pt could go to PT the day after her shoulder manipulation on 09/08/16

## 2016-08-31 ENCOUNTER — Encounter (HOSPITAL_BASED_OUTPATIENT_CLINIC_OR_DEPARTMENT_OTHER): Payer: Self-pay | Admitting: *Deleted

## 2016-08-31 DIAGNOSIS — M6281 Muscle weakness (generalized): Secondary | ICD-10-CM | POA: Diagnosis not present

## 2016-08-31 DIAGNOSIS — M25511 Pain in right shoulder: Secondary | ICD-10-CM | POA: Diagnosis not present

## 2016-09-04 ENCOUNTER — Encounter: Payer: Self-pay | Admitting: Internal Medicine

## 2016-09-05 ENCOUNTER — Other Ambulatory Visit: Payer: Self-pay | Admitting: Internal Medicine

## 2016-09-05 MED ORDER — METOPROLOL SUCCINATE ER 50 MG PO TB24: ORAL_TABLET | ORAL | 1 refills | Status: DC

## 2016-09-07 DIAGNOSIS — M6281 Muscle weakness (generalized): Secondary | ICD-10-CM | POA: Diagnosis not present

## 2016-09-07 DIAGNOSIS — M25511 Pain in right shoulder: Secondary | ICD-10-CM | POA: Diagnosis not present

## 2016-09-07 NOTE — H&P (Addendum)
Stephanie Fears, MD   Biagio Borg, PA-C [address] [place], [place]  [address]                             [phone]   ORTHOPAEDIC HISTORY & PHYSICAL  [name] MRN:  [id number] DOB/SEX:  Jul 10, 1970/female  CHIEF COMPLAINT:  Painful right shoulder with loss of motion  HISTORY: 46 y/o female now status post right shoulder arthroscopic subacromial decompression with distal clavicle resection and mini open rotator cuff repair with open biceps tenodesis. She has undergone physical therapy for this for the past the 7-8 weeks. She has not progressed well. She has developed arthrofibrosis of the shoulder. PAST MEDICAL HISTORY: Patient Active Problem List   Diagnosis Date Noted  . Nontraumatic incomplete tear of right rotator cuff 07/14/2016  . AC (acromioclavicular) arthritis 07/14/2016  . Impingement syndrome of right shoulder 07/14/2016  . Fibrocystic breast changes, right 05/21/2016  . Menopausal symptoms 10/30/2015  . Insomnia 10/16/2015  . Acute bronchitis 08/19/2015  . Snoring 07/26/2015  . Migraine without aura and without status migrainosus, not intractable 02/24/2015  . Muscle spasm 02/24/2015  . Facial paresthesia 02/24/2015  . Concussion with loss of consciousness 07/21/2014  . Hyperlipidemia associated with type 2 diabetes mellitus ([place]) 06/13/2014  . Pain in joint, shoulder region 08/11/2013  . Vitamin D deficiency 05/07/2013  . Chronic migraine 11/20/2012  . S/P Total Abdominal Hysterectomy and Left Salpingo-oophorectomy 10/20/2011  . Headache disorder   . Uncontrolled type 2 diabetes mellitus ([place]) 07/20/2011  . Obesity (BMI 30-39.9) 07/20/2011  . Hypertension goal BP (blood pressure) < 130/80 07/20/2011   Past Medical History:  Diagnosis Date  . Allergy   . Anemia   . Anxiety    claustrophobic  . Asthma   . Diabetes mellitus 2011   did not start metforfin, losing weight  . Headache disorder   . Hypertension   . Shoulder impingement  syndrome, right    Past Surgical History:  Procedure Laterality Date  . ABDOMINAL HYSTERECTOMY  2006   heavy menses, endometriosis, l oophrectomy  . BACK SURGERY    . BREAST SURGERY     right breast x 2 , benign  . HERNIA REPAIR  2003   left inguinal   . LEFT OOPHORECTOMY    . SHOULDER ARTHROSCOPY WITH SUBACROMIAL DECOMPRESSION AND OPEN ROTATOR C Right 07/14/2016   Procedure: RIGHT SHOULDER ARTHROSCOPY WITH SUBACROMIAL DECOMPRESSION, DISTAL CLAVICLE RESECTION AND MINI OPEN ROTATOR CUFF REPAIR, OPEN BICEP TENDODESIS;  Surgeon: Garald Balding, MD;  Location: [facility];  Service: Orthopedics;  Laterality: Right;  . TUBAL LIGATION       MEDICATIONS:   Prescriptions Prior to Admission  Medication Sig Dispense Refill Last Dose  . aspirin EC 81 MG tablet Take 81 mg by mouth daily.   Past Week at Unknown time  . atorvastatin (LIPITOR) 20 MG tablet Take 1 tablet (20 mg total) by mouth daily. 90 tablet 3 09/07/2016 at Unknown time  . diazepam (VALIUM) 10 MG tablet Take 1 tablet (10 mg total) by mouth every 12 (twelve) hours as needed (muscle spasm). 30 tablet 1 Past Month at Unknown time  . esomeprazole (NEXIUM) 20 MG capsule Take 20 mg by mouth daily at 12 noon.   09/08/2016 at [place]  . ketorolac (TORADOL) 10 MG tablet Take 1 tablet (10 mg total) by mouth every 6 (six) hours as needed. 30 tablet 1 Past Month  at Unknown time  . liraglutide 18 MG/3ML SOPN 0.6 mg  SubQ  once daily for 1 week;  increase to 1.2 mg once daily;  may increase  to 1.8 mg once daily 9 mL 2 09/07/2016 at Unknown time  . losartan-hydrochlorothiazide (HYZAAR) 50-12.5 MG tablet Take 1 tablet by mouth daily. 90 tablet 3 09/07/2016 at Unknown time  . meclizine (ANTIVERT) 25 MG tablet Take 25 mg by mouth 3 (three) times daily as needed for dizziness.   Past Month at Unknown time  . methocarbamol (ROBAXIN) 500 MG tablet Take 1 tablet (500 mg total) by mouth every 8 (eight) hours as needed for muscle spasms. 40 tablet 0  09/07/2016 at Unknown time  . metoprolol succinate (TOPROL-XL) 50 MG 24 hr tablet TAKE 1 TABLET BY MOUTH DAILY. TAKE WITH OR IMMEDIATELY FOLLOWING A MEAL. 90 tablet 1 09/08/2016 at 0545  . ondansetron (ZOFRAN) 4 MG tablet Take 1 tablet (4 mg total) by mouth every 8 (eight) hours as needed for nausea or vomiting. 10 tablet 0 Past Month at Unknown time  . oxyCODONE-acetaminophen (ROXICET) 5-325 MG tablet Take 1-2 tablets by mouth every 4 (four) hours as needed for severe pain. 60 tablet 0 09/07/2016 at Unknown time  . albuterol (PROVENTIL HFA;VENTOLIN HFA) 108 (90 BASE) MCG/ACT inhaler Inhale 2 puffs into the lungs every 6 (six) hours as needed for wheezing or shortness of breath. One puff as needed 3.7 g 1 More than a month at Unknown time  . EPINEPHrine 0.3 mg/0.3 mL IJ SOAJ injection   3 Taking  . montelukast (SINGULAIR) 10 MG tablet Take 1 tablet (10 mg total) by mouth at bedtime. 30 tablet 3 Unknown at Unknown time  . UNIFINE PENTIPS 31G X 5 MM MISC   0    Current Facility-Administered Medications:  .  0.9 %  sodium chloride infusion, 75 mL/hr, Intravenous, Continuous, Zipporah Finamore D Toretto Tingler, PA-C .  fentaNYL (SUBLIMAZE) injection 50-100 mcg, 50-100 mcg, Intravenous, PRN, Nolon Nations, MD .  lactated ringers infusion, , Intravenous, Continuous, Nolon Nations, MD, Last Rate: 10 mL/hr at 09/08/16 1215 .  midazolam (VERSED) injection 1-2 mg, 1-2 mg, Intravenous, PRN, Nolon Nations, MD .  scopolamine (TRANSDERM-SCOP) 1 MG/3DAYS 1.5 mg, 1 patch, Transdermal, Once PRN, Nolon Nations, MD  ALLERGIES:   Allergies  Allergen Reactions  . Botox [Onabotulinumtoxina] Shortness Of Breath    syncope  . Clindamycin/Lincomycin Other (See Comments)  . Kiwi Extract Shortness Of Breath  . Maxalt [Rizatriptan Benzoate] Anaphylaxis    Chest pain  . Nitrous Oxide Shortness Of Breath    syncope  . Triptans Shortness Of Breath  . Aspirin Nausea And Vomiting    Mouth blisters  . Flagyl [Metronidazole]   .  Hydrocodone-Acetaminophen Other (See Comments)  . Latex     Sometimes causes rash  . Mango Flavor   . Rizatriptan Other (See Comments)  . Tetracycline Nausea And Vomiting  . Vicodin [Hydrocodone-Acetaminophen]     hallucinations          . Tetracyclines & Related Rash    REVIEW OF SYSTEMS:  Comprehensive review of systems otherwise negative except for history of present illness   FAMILY HISTORY:   Family History  Problem Relation Age of Onset  . Diabetes Mother   . Heart disease Mother   . Hypertension Mother   . Hyperlipidemia Mother   . Cancer Father 45    Lung Cancer  . Mental illness Sister     bipolar, substance abuse,  clean 2  yrs  . Hypertension Sister   . Cancer Paternal Grandmother 29    breast cancer  . Diabetes Son     SOCIAL HISTORY:   Social History  Substance Use Topics  . Smoking status: Never Smoker  . Smokeless tobacco: Never Used  . Alcohol use No      EXAMINATION: Vital signs in last 24 hours: Temp:  [98.3 F (36.8 C)] 98.3 F (36.8 C) (04/05 1156) Pulse Rate:  [86] 86 (04/05 1156) Resp:  [16] 16 (04/05 1156) BP: (134)/(92) 134/92 (04/05 1156) SpO2:  [100 %] 100 % (04/05 1156) Weight:  [213 lb (96.6 kg)] 213 lb (96.6 kg) (04/05 1156)  Head is normocephalic.   Eyes:  Pupils equal, round and reactive to light and accommodation.  Extraocular intact. ENT: Ears, nose, and throat were benign.   Neck: supple, no bruits were noted.   Chest: good expansion.   Lungs: essentially clear.   Cardiac: regular rhythm and rate, normal S1, S2.  No murmurs appreciated. Pulses :  2+ bilateral and symmetric in upper extremities. Abdomen is scaphoid, soft, nontender, no masses palpable, normal bowel sounds  present. CNS:  He is oriented x3 and cranial nerves II-XII grossly intact. Breast, rectal, and genital exams: not performed and not indicated for an orthopedic evaluation. Musculoskeletal:  at this time she has abduction to about 90 forward flexed  about 95 which is quite tight. Healed without signs of infection.    ASSESSMENT: right shoulder arthrofibrosis status post arthroscopic subacromial decompression with distal clavicle excision with rotator cuff repair and open biceps tenodesis  Past Medical History:  Diagnosis Date  . Allergy   . Anemia   . Anxiety    claustrophobic  . Asthma   . Diabetes mellitus 2011   did not start metforfin, losing weight  . Headache disorder   . Hypertension   . Shoulder impingement syndrome, right     PLAN: Plan for right shoulder manipulation under anesthesia  The procedure,  risks, and benefits of surgery were presented and reviewed. The risks including but not limited to infection, blood clots, vascular and nerve injury, stiffness,  among others were discussed. The patient acknowledged the explanation, agreed to proceed.   Mike Craze [place], [place] [phone]  09/08/2016 12:17 PM

## 2016-09-08 ENCOUNTER — Encounter (HOSPITAL_BASED_OUTPATIENT_CLINIC_OR_DEPARTMENT_OTHER)
Admission: RE | Disposition: A | Discharge status: Home / Self Care | Payer: Self-pay | Source: Ambulatory Visit | Attending: Orthopaedic Surgery

## 2016-09-08 ENCOUNTER — Ambulatory Visit (HOSPITAL_BASED_OUTPATIENT_CLINIC_OR_DEPARTMENT_OTHER): Payer: 59 | Admitting: Anesthesiology

## 2016-09-08 ENCOUNTER — Ambulatory Visit (HOSPITAL_BASED_OUTPATIENT_CLINIC_OR_DEPARTMENT_OTHER)
Admission: RE | Admit: 2016-09-08 | Discharge: 2016-09-08 | Disposition: A | Discharge status: Home / Self Care | Payer: 59 | Source: Ambulatory Visit | Attending: Orthopaedic Surgery | Admitting: Orthopaedic Surgery

## 2016-09-08 ENCOUNTER — Encounter (HOSPITAL_BASED_OUTPATIENT_CLINIC_OR_DEPARTMENT_OTHER): Payer: Self-pay | Admitting: *Deleted

## 2016-09-08 DIAGNOSIS — Z79891 Long term (current) use of opiate analgesic: Secondary | ICD-10-CM | POA: Insufficient documentation

## 2016-09-08 DIAGNOSIS — E669 Obesity, unspecified: Secondary | ICD-10-CM | POA: Diagnosis not present

## 2016-09-08 DIAGNOSIS — Z885 Allergy status to narcotic agent status: Secondary | ICD-10-CM | POA: Insufficient documentation

## 2016-09-08 DIAGNOSIS — Z6834 Body mass index (BMI) 34.0-34.9, adult: Secondary | ICD-10-CM | POA: Insufficient documentation

## 2016-09-08 DIAGNOSIS — Z881 Allergy status to other antibiotic agents status: Secondary | ICD-10-CM | POA: Insufficient documentation

## 2016-09-08 DIAGNOSIS — I10 Essential (primary) hypertension: Secondary | ICD-10-CM | POA: Insufficient documentation

## 2016-09-08 DIAGNOSIS — G4700 Insomnia, unspecified: Secondary | ICD-10-CM | POA: Insufficient documentation

## 2016-09-08 DIAGNOSIS — Z91040 Latex allergy status: Secondary | ICD-10-CM | POA: Diagnosis not present

## 2016-09-08 DIAGNOSIS — M7541 Impingement syndrome of right shoulder: Secondary | ICD-10-CM | POA: Diagnosis not present

## 2016-09-08 DIAGNOSIS — Z7982 Long term (current) use of aspirin: Secondary | ICD-10-CM | POA: Diagnosis not present

## 2016-09-08 DIAGNOSIS — E119 Type 2 diabetes mellitus without complications: Secondary | ICD-10-CM | POA: Insufficient documentation

## 2016-09-08 DIAGNOSIS — M75111 Incomplete rotator cuff tear or rupture of right shoulder, not specified as traumatic: Secondary | ICD-10-CM | POA: Diagnosis not present

## 2016-09-08 DIAGNOSIS — J45909 Unspecified asthma, uncomplicated: Secondary | ICD-10-CM | POA: Insufficient documentation

## 2016-09-08 DIAGNOSIS — M7501 Adhesive capsulitis of right shoulder: Secondary | ICD-10-CM | POA: Diagnosis not present

## 2016-09-08 DIAGNOSIS — F419 Anxiety disorder, unspecified: Secondary | ICD-10-CM | POA: Diagnosis not present

## 2016-09-08 DIAGNOSIS — Z79899 Other long term (current) drug therapy: Secondary | ICD-10-CM | POA: Diagnosis not present

## 2016-09-08 DIAGNOSIS — E785 Hyperlipidemia, unspecified: Secondary | ICD-10-CM | POA: Diagnosis not present

## 2016-09-08 DIAGNOSIS — M7581 Other shoulder lesions, right shoulder: Secondary | ICD-10-CM | POA: Diagnosis present

## 2016-09-08 DIAGNOSIS — G8918 Other acute postprocedural pain: Secondary | ICD-10-CM | POA: Diagnosis not present

## 2016-09-08 DIAGNOSIS — Z7951 Long term (current) use of inhaled steroids: Secondary | ICD-10-CM | POA: Insufficient documentation

## 2016-09-08 HISTORY — PX: SHOULDER CLOSED REDUCTION: SHX1051

## 2016-09-08 LAB — GLUCOSE, CAPILLARY
Glucose-Capillary: 86 mg/dL (ref 65–99)
Glucose-Capillary: 141 mg/dL — ABNORMAL HIGH (ref 65–99)

## 2016-09-08 SURGERY — MANIPULATION, JOINT, SHOULDER, WITH ANESTHESIA
Anesthesia: General | Site: Shoulder | Laterality: Right

## 2016-09-08 MED ORDER — HYDROMORPHONE HCL 1 MG/ML IJ SOLN
0.2500 mg | INTRAMUSCULAR | Status: DC | PRN
  Administered 2016-09-08 (×2): 0.5 mg via INTRAVENOUS

## 2016-09-08 MED ORDER — OXYCODONE-ACETAMINOPHEN 5-325 MG PO TABS: 1.0000 | ORAL_TABLET | ORAL | 0 refills | Status: DC | PRN

## 2016-09-08 MED ORDER — KETOROLAC TROMETHAMINE 30 MG/ML IJ SOLN
30.0000 mg | Freq: Once | INTRAMUSCULAR | Status: AC
  Administered 2016-09-08: 30 mg via INTRAVENOUS

## 2016-09-08 MED ORDER — HYDROMORPHONE HCL 1 MG/ML IJ SOLN
INTRAMUSCULAR | Status: AC
  Filled 2016-09-08: qty 1

## 2016-09-08 MED ORDER — LACTATED RINGERS IV SOLN
INTRAVENOUS | Status: DC
  Administered 2016-09-08: 12:00:00 via INTRAVENOUS

## 2016-09-08 MED ORDER — MEPERIDINE HCL 25 MG/ML IJ SOLN: 6.2500 mg | INTRAMUSCULAR | Status: DC | PRN

## 2016-09-08 MED ORDER — FENTANYL CITRATE (PF) 100 MCG/2ML IJ SOLN
INTRAMUSCULAR | Status: AC
  Filled 2016-09-08: qty 2

## 2016-09-08 MED ORDER — LIDOCAINE 2% (20 MG/ML) 5 ML SYRINGE
INTRAMUSCULAR | Status: DC | PRN
Start: 2016-09-08 — End: 2016-09-08
  Administered 2016-09-08: 50 mg via INTRAVENOUS

## 2016-09-08 MED ORDER — SCOPOLAMINE 1 MG/3DAYS TD PT72: 1.0000 | MEDICATED_PATCH | Freq: Once | TRANSDERMAL | Status: DC | PRN

## 2016-09-08 MED ORDER — BUPIVACAINE-EPINEPHRINE (PF) 0.5% -1:200000 IJ SOLN
INTRAMUSCULAR | Status: DC | PRN
Start: 2016-09-08 — End: 2016-09-08
  Administered 2016-09-08: 30 mL via PERINEURAL

## 2016-09-08 MED ORDER — DEXAMETHASONE SODIUM PHOSPHATE 10 MG/ML IJ SOLN
INTRAMUSCULAR | Status: AC
  Filled 2016-09-08: qty 1

## 2016-09-08 MED ORDER — ONDANSETRON HCL 4 MG/2ML IJ SOLN
INTRAMUSCULAR | Status: DC | PRN
  Administered 2016-09-08: 4 mg via INTRAVENOUS

## 2016-09-08 MED ORDER — KETOROLAC TROMETHAMINE 30 MG/ML IM SOLN: 30.0000 mg | Freq: Once | INTRAMUSCULAR | Status: AC

## 2016-09-08 MED ORDER — PROPOFOL 10 MG/ML IV BOLUS
INTRAVENOUS | Status: DC | PRN
Start: 2016-09-08 — End: 2016-09-08
  Administered 2016-09-08: 200 mg via INTRAVENOUS

## 2016-09-08 MED ORDER — METHYLPREDNISOLONE ACETATE 80 MG/ML IJ SUSP
INTRAMUSCULAR | Status: DC | PRN
  Administered 2016-09-08: 80 mg

## 2016-09-08 MED ORDER — LIDOCAINE 2% (20 MG/ML) 5 ML SYRINGE
INTRAMUSCULAR | Status: AC
  Filled 2016-09-08: qty 5

## 2016-09-08 MED ORDER — DEXAMETHASONE SODIUM PHOSPHATE 4 MG/ML IJ SOLN
INTRAMUSCULAR | Status: DC | PRN
  Administered 2016-09-08: 10 mg via INTRAVENOUS

## 2016-09-08 MED ORDER — ONDANSETRON HCL 4 MG/2ML IJ SOLN: 4.0000 mg | Freq: Once | INTRAMUSCULAR | Status: DC | PRN

## 2016-09-08 MED ORDER — KETOROLAC TROMETHAMINE 30 MG/ML IJ SOLN
INTRAMUSCULAR | Status: AC
  Filled 2016-09-08: qty 1

## 2016-09-08 MED ORDER — FENTANYL CITRATE (PF) 100 MCG/2ML IJ SOLN
50.0000 ug | INTRAMUSCULAR | Status: DC | PRN
  Administered 2016-09-08 (×2): 100 ug via INTRAVENOUS

## 2016-09-08 MED ORDER — MIDAZOLAM HCL 2 MG/2ML IJ SOLN
INTRAMUSCULAR | Status: AC
  Filled 2016-09-08: qty 2

## 2016-09-08 MED ORDER — KETOROLAC TROMETHAMINE 30 MG/ML IM SOLN: 30.0000 mg | Freq: Once | INTRAMUSCULAR | Status: DC

## 2016-09-08 MED ORDER — BUPIVACAINE HCL (PF) 0.25 % IJ SOLN
INTRAMUSCULAR | Status: DC | PRN
  Administered 2016-09-08: 6 mL

## 2016-09-08 MED ORDER — ONDANSETRON HCL 4 MG/2ML IJ SOLN
INTRAMUSCULAR | Status: AC
  Filled 2016-09-08: qty 2

## 2016-09-08 MED ORDER — SODIUM CHLORIDE 0.9 % IV SOLN: 75.0000 mL/h | INTRAVENOUS | Status: DC

## 2016-09-08 MED ORDER — MIDAZOLAM HCL 2 MG/2ML IJ SOLN
1.0000 mg | INTRAMUSCULAR | Status: DC | PRN
  Administered 2016-09-08: 2 mg via INTRAVENOUS

## 2016-09-08 SURGICAL SUPPLY — 12 items
BANDAGE ADH SHEER 1  50/CT (GAUZE/BANDAGES/DRESSINGS) ×2 IMPLANT
GAUZE SPONGE 4X4 12PLY STRL LF (GAUZE/BANDAGES/DRESSINGS) IMPLANT
GLOVE BIOGEL PI IND STRL 8 (GLOVE) ×1 IMPLANT
GLOVE BIOGEL PI INDICATOR 8 (GLOVE) ×1
GLOVE ECLIPSE 8.0 STRL XLNG CF (GLOVE) ×2 IMPLANT
NDL SAFETY ECLIPSE 18X1.5 (NEEDLE) ×1 IMPLANT
NEEDLE HYPO 18GX1.5 SHARP (NEEDLE) ×2
NEEDLE HYPO 22GX1.5 SAFETY (NEEDLE) ×2 IMPLANT
NEEDLE SPNL 18GX3.5 QUINCKE PK (NEEDLE) IMPLANT
PAD ALCOHOL SWAB (MISCELLANEOUS) IMPLANT
SWABSTICK POVIDONE IODINE SNGL (MISCELLANEOUS) ×2 IMPLANT
SYR CONTROL 10ML LL (SYRINGE) ×2 IMPLANT

## 2016-09-08 NOTE — Progress Notes (Signed)
Assisted Dr. Ossey with right, ultrasound guided, interscalene  block. Side rails up, monitors on throughout procedure. See vital signs in flow sheet. Tolerated Procedure well. 

## 2016-09-08 NOTE — Discharge Instructions (Signed)

## 2016-09-08 NOTE — Op Note (Signed)
NAME:  [name], [name]                   ACCOUNT NO.:  MEDICAL RECORD NO.:  [id number]  LOCATION:                                 FACILITY:  PHYSICIAN:  Vonna Kotyk. Durward Fortes, M.D.    DATE OF BIRTH:  DATE OF PROCEDURE:  09/08/2016 DATE OF DISCHARGE:                              OPERATIVE REPORT   PREOPERATIVE DIAGNOSIS:  Adhesive capsulitis, right shoulder.  POSTOPERATIVE DIAGNOSIS:  Adhesive capsulitis, right shoulder.  PROCEDURES:  Closed manipulation, adhesive capsulitis, right shoulder.  ASSISTANT:  Biagio Borg, PAC.  ANESTHESIA:  General mask.  COMPLICATIONS:  None.  HISTORY:  A 46 year old female who is 2 months status post rotator cuff tear repair of the right shoulder associated with an arthroscopic SAD and distal clavicle resection.  Despite a course of physical therapy, she has developed limited range of motion of the shoulder associated with pain.  She has just barely 90 degrees of flexion and about 70-80 degrees of abduction with pain beyond that point.  She now wished to have a manipulation.  DESCRIPTION OF PROCEDURE:  Ms. Stenner was met in the holding area identified the right shoulder as appropriate operative site and marked it accordingly.  The patient was then transported on the operating stretcher to room #2 remaining comfortably on the operating stretcher, she was then placed under general anesthesia without difficulty. Manipulation of the shoulder was performed in 4 planes, i.e., abduction, flexion, internal, external rotation.  With each careful manipulation, there was obvious lysis of adhesions such that I could place her right arm fully overhead in flexion, abduct over 100 degrees and had symmetrical internal and external rotation compared to the left side.  The anterior aspect of the shoulder was then prepped with alcohol and Betadine.  I injected 6 mL of Marcaine 0.25% without epinephrine and 80 mg Depo-Medrol into the shoulder joint.  Band-Aid  was applied.  The patient was awoken, returned to the postanesthesia recovery room in satisfactory condition.  PLAN:  Follow up physical therapy in the morning.  Sling as necessary. Oxycodone for pain.  Office in 7-10 days.     Vonna Kotyk. Durward Fortes, M.D.     PWW/MEDQ  D:  09/08/2016  T:  09/08/2016  Job:  [id number]

## 2016-09-08 NOTE — Transfer of Care (Signed)
Immediate Anesthesia Transfer of Care Note  Patient: [name]  Procedure(s) Performed: Procedure(s): RIGHT CLOSED MANIPULATION SHOULDER (Right)  Patient Location: PACU  Anesthesia Type:General  Level of Consciousness: awake and sedated  Airway & Oxygen Therapy: Patient Spontanous Breathing and Patient connected to face mask oxygen  Post-op Assessment: Report given to RN and Post -op Vital signs reviewed and stable  Post vital signs: Reviewed and stable  Last Vitals:  Vitals:   09/08/16 1156  BP: (!) 134/92  Pulse: 86  Resp: 16  Temp: 36.8 C    Last Pain:  Vitals:   09/08/16 1156  TempSrc: Oral  PainSc: 3       Patients Stated Pain Goal: 3 (79/89/21 1941)  Complications: No apparent anesthesia complications

## 2016-09-08 NOTE — Anesthesia Preprocedure Evaluation (Signed)
Anesthesia Evaluation  Patient identified by MRN, date of birth, ID band Patient awake    Reviewed: Allergy & Precautions, NPO status , Patient's Chart, lab work & pertinent test results  Airway Mallampati: I  TM Distance: >3 FB Neck ROM: Full    Dental   Pulmonary    Pulmonary exam normal        Cardiovascular hypertension, Pt. on medications Normal cardiovascular exam     Neuro/Psych    GI/Hepatic   Endo/Other  diabetes, Type 2, Oral Hypoglycemic Agents  Renal/GU      Musculoskeletal   Abdominal   Peds  Hematology   Anesthesia Other Findings   Reproductive/Obstetrics                             Anesthesia Physical Anesthesia Plan  ASA: II  Anesthesia Plan: General   Post-op Pain Management:  Regional for Post-op pain   Induction: Intravenous  Airway Management Planned: Mask  Additional Equipment:   Intra-op Plan:   Post-operative Plan:   Informed Consent: I have reviewed the patients History and Physical, chart, labs and discussed the procedure including the risks, benefits and alternatives for the proposed anesthesia with the patient or authorized representative who has indicated his/her understanding and acceptance.     Plan Discussed with: CRNA and Surgeon  Anesthesia Plan Comments:         Anesthesia Quick Evaluation

## 2016-09-08 NOTE — Anesthesia Postprocedure Evaluation (Signed)
Anesthesia Post Note  Patient: [name]  Procedure(s) Performed: Procedure(s) (LRB): RIGHT CLOSED MANIPULATION SHOULDER (Right)  Patient location during evaluation: PACU Anesthesia Type: General Level of consciousness: awake and alert Pain management: pain level controlled Vital Signs Assessment: post-procedure vital signs reviewed and stable Respiratory status: spontaneous breathing, nonlabored ventilation, respiratory function stable and patient connected to nasal cannula oxygen Cardiovascular status: blood pressure returned to baseline and stable Postop Assessment: no signs of nausea or vomiting Anesthetic complications: no       Last Vitals:  Vitals:   09/08/16 1330 09/08/16 1345  BP: 128/84 133/88  Pulse: 95 91  Resp: 16 15  Temp:      Last Pain:  Vitals:   09/08/16 1315  TempSrc:   PainSc: 6                  Siddhartha Hoback DAVID

## 2016-09-08 NOTE — Anesthesia Procedure Notes (Signed)
Performed by: Terrance Mass Pre-anesthesia Checklist: Patient identified, Timeout performed, Suction available, Emergency Drugs available and Patient being monitored Patient Re-evaluated:Patient Re-evaluated prior to inductionOxygen Delivery Method: Simple face mask Preoxygenation: Pre-oxygenation with 100% oxygen Placement Confirmation: positive ETCO2

## 2016-09-08 NOTE — Progress Notes (Signed)
In PACU Pt still having some pain after interscalene block. I elected with her consent to inject local anesthetic into her Supraclavicular region with good effect. @0cc  Naropin 0.5% injected.under Ultrasound guidance. Pt pain free. No problems.  Lillia Abed MD

## 2016-09-08 NOTE — Progress Notes (Signed)
Assisted Dr. Ossey with right, ultrasound guided, supraclavicular block. Side rails up, monitors on throughout procedure. See vital signs in flow sheet. Tolerated Procedure well. 

## 2016-09-08 NOTE — Anesthesia Procedure Notes (Signed)
Anesthesia Regional Block: Interscalene brachial plexus block   Pre-Anesthetic Checklist: ,, timeout performed, Correct Patient, Correct Site, Correct Laterality, Correct Procedure, Correct Position, site marked, Risks and benefits discussed,  Surgical consent,  Pre-op evaluation,  At surgeon's request and post-op pain management  Laterality: Right  Prep: chloraprep       Needles:  Injection technique: Single-shot  Needle Type: Echogenic Stimulator Needle     Needle Length: 5cm  Needle Gauge: 21     Additional Needles:   Procedures: ultrasound guided, nerve stimulator,,,,,,   Nerve Stimulator or Paresthesia:  Response: 0.4 mA,   Additional Responses:   Narrative:  Start time: 09/08/2016 12:55 PM End time: 09/08/2016 1:05 PM Injection made incrementally with aspirations every 5 mL.  Additional Notes: Monitors applied. Patient sedated. Sterile prep and drape,hand hygiene and sterile gloves were used. Relevant anatomy identified.Needle position confirmed.Local anesthetic injected incrementally after negative aspiration. Local anesthetic spread visualized around nerve(s). Vascular puncture avoided. No complications. Image printed for medical record.The patient tolerated the procedure well.

## 2016-09-08 NOTE — Brief Op Note (Signed)
PATIENT ID:      [name]  MRN:     [id number] DOB/AGE:    1970-10-11 / 46 y.o.       OPERATIVE REPORT    DATE OF PROCEDURE:  09/08/2016       PREOPERATIVE DIAGNOSIS:   RIGHT SHOULDER ADHESIVE CAPSULITIS                                                       Estimated body mass index is 34.38 kg/m as calculated from the following:   Height as of this encounter: 5\' 6"  (1.676 m).   Weight as of this encounter: 213 lb (96.6 kg).     POSTOPERATIVE DIAGNOSIS: SAME                                                                   Estimated body mass index is 34.38 kg/m as calculated from the following:   Height as of this encounter: 5\' 6"  (1.676 m).   Weight as of this encounter: 213 lb (96.6 kg).     PROCEDURE:  Procedure(s):  CLOSED MANIPULATION RIGHT SHOULDER      SURGEON:  Joni Fears, MD    ASSISTANT:   Biagio Borg, PA-C   (Present and scrubbed throughout the case, critical for assistance with exposure, retraction, instrumentation, and closure.)          ANESTHESIA: general     DRAINS: none :      TOURNIQUET TIME: * No tourniquets in log *    COMPLICATIONS:  None   CONDITION:  stable  PROCEDURE IN DETAIL:    Garald Balding 09/08/2016, 12:35 PM

## 2016-09-09 ENCOUNTER — Encounter (HOSPITAL_BASED_OUTPATIENT_CLINIC_OR_DEPARTMENT_OTHER): Payer: Self-pay | Admitting: Orthopaedic Surgery

## 2016-09-09 DIAGNOSIS — M6281 Muscle weakness (generalized): Secondary | ICD-10-CM | POA: Diagnosis not present

## 2016-09-09 DIAGNOSIS — M25511 Pain in right shoulder: Secondary | ICD-10-CM | POA: Diagnosis not present

## 2016-09-09 NOTE — Addendum Note (Signed)
Addendum  created 09/09/16 0847 by Ernesta Amble Aleck Locklin, CRNA   Charge Capture section accepted

## 2016-09-12 ENCOUNTER — Ambulatory Visit (INDEPENDENT_AMBULATORY_CARE_PROVIDER_SITE_OTHER): Payer: 59 | Admitting: Orthopaedic Surgery

## 2016-09-12 DIAGNOSIS — M6281 Muscle weakness (generalized): Secondary | ICD-10-CM | POA: Diagnosis not present

## 2016-09-12 DIAGNOSIS — M25511 Pain in right shoulder: Secondary | ICD-10-CM | POA: Diagnosis not present

## 2016-09-13 ENCOUNTER — Ambulatory Visit (INDEPENDENT_AMBULATORY_CARE_PROVIDER_SITE_OTHER): Payer: 59 | Admitting: Orthopaedic Surgery

## 2016-09-13 ENCOUNTER — Encounter (INDEPENDENT_AMBULATORY_CARE_PROVIDER_SITE_OTHER): Payer: Self-pay | Admitting: Orthopaedic Surgery

## 2016-09-13 VITALS — BP 138/97 | HR 91 | Resp 14 | Ht 69.0 in | Wt 213.0 lb

## 2016-09-13 DIAGNOSIS — M7501 Adhesive capsulitis of right shoulder: Secondary | ICD-10-CM

## 2016-09-13 NOTE — Progress Notes (Signed)
Office Visit Note   Patient: [name]           Date of Birth: [birth year]           MRN: [id number] Visit Date: 09/13/2016              Requested by: Crecencio Mc, MD [address] [place], [place] [address] PCP: Crecencio Mc, MD   Assessment & Plan: Visit Diagnoses:  1. Adhesive capsulitis of right shoulder   Status post manipulation of right shoulder 5 days-doing well  Plan: Continue with physical therapy, note to return to work Thursday with some restrictions, office 2 weeks  Follow-Up Instructions: Return in about 2 weeks (around 09/27/2016).   Orders:  No orders of the defined types were placed in this encounter.  No orders of the defined types were placed in this encounter.     Procedures: No procedures performed   Clinical Data: No additional findings.   Subjective: Chief Complaint  Patient presents with  . Right Shoulder - Routine Post Op    Pt going well, 90% PROM Soreness at incision site Neck/shoulder muscles surrounding are very tight    HPI [name] is 5 days status post closed manipulation of her right shoulder for adhesive capsulitis. Been going to physical therapy and feels like she is making good progress. She doesn't have the pain that she had preoperatively. She denies numbness tingling fever or chills. She would like to return to work on Thursday, April 12 with some restrictions Review of Systems   Objective: Vital Signs: BP (!) 138/97   Pulse 91   Resp 14   Ht 5\' 9"  (1.753 m)   Wt 213 lb (96.6 kg)   BMI 31.45 kg/m   Physical Exam  Ortho Exam right shoulder passive range of motion over 90 of abduction. Approximately 150 of flexion with minimal discomfort. Her vascular exam is intact.   No specialty comments available.  Imaging: No results found.   PMFS History: Patient Active Problem List   Diagnosis Date Noted  . Nontraumatic incomplete tear of right rotator cuff 07/14/2016  . AC (acromioclavicular)  arthritis 07/14/2016  . Impingement syndrome of right shoulder 07/14/2016  . Fibrocystic breast changes, right 05/21/2016  . Menopausal symptoms 10/30/2015  . Insomnia 10/16/2015  . Acute bronchitis 08/19/2015  . Snoring 07/26/2015  . Migraine without aura and without status migrainosus, not intractable 02/24/2015  . Muscle spasm 02/24/2015  . Facial paresthesia 02/24/2015  . Concussion with loss of consciousness 07/21/2014  . Hyperlipidemia associated with type 2 diabetes mellitus ([place]) 06/13/2014  . Pain in joint, shoulder region 08/11/2013  . Vitamin D deficiency 05/07/2013  . Chronic migraine 11/20/2012  . S/P Total Abdominal Hysterectomy and Left Salpingo-oophorectomy 10/20/2011  . Headache disorder   . Uncontrolled type 2 diabetes mellitus ([facility]) 07/20/2011  . Obesity (BMI 30-39.9) 07/20/2011  . Hypertension goal BP (blood pressure) < 130/80 07/20/2011   Past Medical History:  Diagnosis Date  . Allergy   . Anemia   . Anxiety    claustrophobic  . Asthma   . Diabetes mellitus 2011   did not start metforfin, losing weight  . Headache disorder   . Hypertension   . Shoulder impingement syndrome, right     Family History  Problem Relation Age of Onset  . Diabetes Mother   . Heart disease Mother   . Hypertension Mother   . Hyperlipidemia Mother   . Cancer Father 55  Lung Cancer  . Mental illness Sister     bipolar, substance abuse,  clean 2 yrs  . Hypertension Sister   . Cancer Paternal Grandmother 42    breast cancer  . Diabetes Son     Past Surgical History:  Procedure Laterality Date  . ABDOMINAL HYSTERECTOMY  2006   heavy menses, endometriosis, l oophrectomy  . BACK SURGERY    . BREAST SURGERY     right breast x 2 , benign  . HERNIA REPAIR  2003   left inguinal   . LEFT OOPHORECTOMY    . SHOULDER ARTHROSCOPY WITH SUBACROMIAL DECOMPRESSION AND OPEN ROTATOR C Right 07/14/2016   Procedure: RIGHT SHOULDER ARTHROSCOPY WITH SUBACROMIAL DECOMPRESSION, DISTAL  CLAVICLE RESECTION AND MINI OPEN ROTATOR CUFF REPAIR, OPEN BICEP TENDODESIS;  Surgeon: Garald Balding, MD;  Location: [facility];  Service: Orthopedics;  Laterality: Right;  . SHOULDER CLOSED REDUCTION Right 09/08/2016   Procedure: RIGHT CLOSED MANIPULATION SHOULDER;  Surgeon: Garald Balding, MD;  Location: [facility];  Service: Orthopedics;  Laterality: Right;  . TUBAL LIGATION     Social History   Occupational History  . Not on file.   Social History Main Topics  . Smoking status: Never Smoker  . Smokeless tobacco: Never Used  . Alcohol use No  . Drug use: No  . Sexual activity: Not Currently    Partners: Male    Birth control/ protection: Surgical

## 2016-09-14 ENCOUNTER — Inpatient Hospital Stay (INDEPENDENT_AMBULATORY_CARE_PROVIDER_SITE_OTHER): Payer: 59 | Admitting: Orthopedic Surgery

## 2016-09-14 DIAGNOSIS — M6281 Muscle weakness (generalized): Secondary | ICD-10-CM | POA: Diagnosis not present

## 2016-09-14 DIAGNOSIS — M25511 Pain in right shoulder: Secondary | ICD-10-CM | POA: Diagnosis not present

## 2016-09-19 DIAGNOSIS — M6281 Muscle weakness (generalized): Secondary | ICD-10-CM | POA: Diagnosis not present

## 2016-09-19 DIAGNOSIS — M25511 Pain in right shoulder: Secondary | ICD-10-CM | POA: Diagnosis not present

## 2016-09-21 DIAGNOSIS — M6281 Muscle weakness (generalized): Secondary | ICD-10-CM | POA: Diagnosis not present

## 2016-09-21 DIAGNOSIS — M25511 Pain in right shoulder: Secondary | ICD-10-CM | POA: Diagnosis not present

## 2016-09-26 ENCOUNTER — Ambulatory Visit (INDEPENDENT_AMBULATORY_CARE_PROVIDER_SITE_OTHER): Payer: 59 | Admitting: Orthopaedic Surgery

## 2016-09-26 ENCOUNTER — Encounter (INDEPENDENT_AMBULATORY_CARE_PROVIDER_SITE_OTHER): Payer: Self-pay | Admitting: Orthopaedic Surgery

## 2016-09-26 VITALS — BP 133/93 | HR 87 | Resp 16 | Ht 69.0 in | Wt 200.0 lb

## 2016-09-26 DIAGNOSIS — M25511 Pain in right shoulder: Secondary | ICD-10-CM | POA: Diagnosis not present

## 2016-09-26 DIAGNOSIS — M6281 Muscle weakness (generalized): Secondary | ICD-10-CM | POA: Diagnosis not present

## 2016-09-26 DIAGNOSIS — G8929 Other chronic pain: Secondary | ICD-10-CM

## 2016-09-26 NOTE — Progress Notes (Signed)
Office Visit Note   Patient: [name]           Date of Birth: November 06, 1970           MRN: [id number] Visit Date: 09/26/2016              Requested by: Crecencio Mc, MD [id number] [place], [place] [address] PCP: Crecencio Mc, MD   Assessment & Plan: Visit Diagnoses:  1. Chronic right shoulder pain   6 nearly 4 months status post rotator cuff tear repair of her right shoulder and 3 weeks status post manipulation for adhesive capsulitis-doing well  Plan: Continue physical therapy for strengthening of right upper extremity. Continue to work with 5 pound weight restriction.  Follow-Up Instructions: Return in about 1 month (around 10/26/2016).   Orders:  No orders of the defined types were placed in this encounter.  No orders of the defined types were placed in this encounter.     Procedures: No procedures performed   Clinical Data: No additional findings.   Subjective: Chief Complaint  Patient presents with  . Right Shoulder - Routine Post Op    Adhesive capsulitis of right shoulder  2 week, 4 day Status post manipulation of right shoulder  She relates he joint has a constant "ache." [facility] 6 more sessions   Caitlain is actually doing quite well with little if any discomfort. She is quite weak and continues to go to physical therapy. She's working full time with a 5 pound weight restriction and will continue that until she regains her strength  HPI  Review of Systems   Objective: Vital Signs: BP (!) 133/93   Pulse 87   Resp 16   Ht 5\' 9"  (1.753 m)   Wt 200 lb (90.7 kg)   BMI 29.53 kg/m   Physical Exam  Ortho Exam right shoulder with full passive flexion and abduction. May be slight loss of external rotation but not a functional loss. Still has a positive drop arm test. But is able to actively abduct and flex to at least 90. No swelling distally neurovascular exam intact.  Specialty Comments:  No specialty comments  available.  Imaging: No results found.   PMFS History: Patient Active Problem List   Diagnosis Date Noted  . Nontraumatic incomplete tear of right rotator cuff 07/14/2016  . AC (acromioclavicular) arthritis 07/14/2016  . Impingement syndrome of right shoulder 07/14/2016  . Fibrocystic breast changes, right 05/21/2016  . Menopausal symptoms 10/30/2015  . Insomnia 10/16/2015  . Acute bronchitis 08/19/2015  . Snoring 07/26/2015  . Migraine without aura and without status migrainosus, not intractable 02/24/2015  . Muscle spasm 02/24/2015  . Facial paresthesia 02/24/2015  . Concussion with loss of consciousness 07/21/2014  . Hyperlipidemia associated with type 2 diabetes mellitus ([facility]) 06/13/2014  . Pain in joint, shoulder region 08/11/2013  . Vitamin D deficiency 05/07/2013  . Chronic migraine 11/20/2012  . S/P Total Abdominal Hysterectomy and Left Salpingo-oophorectomy 10/20/2011  . Headache disorder   . Uncontrolled type 2 diabetes mellitus ([facility]) 07/20/2011  . Obesity (BMI 30-39.9) 07/20/2011  . Hypertension goal BP (blood pressure) < 130/80 07/20/2011   Past Medical History:  Diagnosis Date  . Allergy   . Anemia   . Anxiety    claustrophobic  . Asthma   . Diabetes mellitus 2011   did not start metforfin, losing weight  . Headache disorder   . Hypertension   . Shoulder impingement syndrome, right  Family History  Problem Relation Age of Onset  . Diabetes Mother   . Heart disease Mother   . Hypertension Mother   . Hyperlipidemia Mother   . Cancer Father 5    Lung Cancer  . Mental illness Sister     bipolar, substance abuse,  clean 2 yrs  . Hypertension Sister   . Cancer Paternal Grandmother 54    breast cancer  . Diabetes Son     Past Surgical History:  Procedure Laterality Date  . ABDOMINAL HYSTERECTOMY  2006   heavy menses, endometriosis, l oophrectomy  . BACK SURGERY    . BREAST SURGERY     right breast x 2 , benign  . HERNIA REPAIR  2003   left  inguinal   . LEFT OOPHORECTOMY    . SHOULDER ARTHROSCOPY WITH SUBACROMIAL DECOMPRESSION AND OPEN ROTATOR C Right 07/14/2016   Procedure: RIGHT SHOULDER ARTHROSCOPY WITH SUBACROMIAL DECOMPRESSION, DISTAL CLAVICLE RESECTION AND MINI OPEN ROTATOR CUFF REPAIR, OPEN BICEP TENDODESIS;  Surgeon: Garald Balding, MD;  Location: [facility];  Service: Orthopedics;  Laterality: Right;  . SHOULDER CLOSED REDUCTION Right 09/08/2016   Procedure: RIGHT CLOSED MANIPULATION SHOULDER;  Surgeon: Garald Balding, MD;  Location: [facility];  Service: Orthopedics;  Laterality: Right;  . TUBAL LIGATION     Social History   Occupational History  . Not on file.   Social History Main Topics  . Smoking status: Never Smoker  . Smokeless tobacco: Never Used  . Alcohol use No  . Drug use: No  . Sexual activity: Not Currently    Partners: Male    Birth control/ protection: Surgical     Garald Balding, MD   Note - This record has been created using [employer].  Chart creation errors have been sought, but may not always  have been located. Such creation errors do not reflect on  the standard of medical care.

## 2016-09-27 ENCOUNTER — Encounter: Payer: Self-pay | Admitting: Internal Medicine

## 2016-09-27 ENCOUNTER — Ambulatory Visit (INDEPENDENT_AMBULATORY_CARE_PROVIDER_SITE_OTHER): Payer: 59 | Admitting: Internal Medicine

## 2016-09-27 VITALS — BP 102/76 | HR 84 | Temp 98.0°F | Resp 16 | Ht 69.0 in | Wt 216.8 lb

## 2016-09-27 DIAGNOSIS — E1121 Type 2 diabetes mellitus with diabetic nephropathy: Secondary | ICD-10-CM | POA: Diagnosis not present

## 2016-09-27 DIAGNOSIS — Z79899 Other long term (current) drug therapy: Secondary | ICD-10-CM

## 2016-09-27 DIAGNOSIS — IMO0002 Reserved for concepts with insufficient information to code with codable children: Secondary | ICD-10-CM

## 2016-09-27 DIAGNOSIS — E1165 Type 2 diabetes mellitus with hyperglycemia: Secondary | ICD-10-CM

## 2016-09-27 DIAGNOSIS — I10 Essential (primary) hypertension: Secondary | ICD-10-CM | POA: Diagnosis not present

## 2016-09-27 NOTE — Progress Notes (Signed)
Subjective:  Patient ID: [name], female    DOB: 13-Mar-1971  Age: 46 y.o. MRN: [id number]  CC: The primary encounter diagnosis was Long-term use of high-risk medication. Diagnoses of Uncontrolled type 2 diabetes mellitus with diabetic nephropathy, without long-term current use of insulin (HCC) and Hypertension goal BP (blood pressure) < 130/80 were also pertinent to this visit.  HPI CATHELEEN LANGHORNE presents for follow up on chronic conditions including type 2 DM ,  Obesity, and hypertension   April 5 whitfield did a frozen shoulder manipulation, she feels she is making small strides in PT on a daily basis  .  And having formal PT twie ce weeklly costing her $40 /session   Type  2 dm:  Using victoza.  Taking it at night   Had some nausea when she first started the medication but has overcome the symptoms and is currently at 1.8 mg daily.  Had an 86 fasting prior to procedure,  No steroids since the procedure, highest post prandial was  131 . Diet reviewed.  She is  Cutting back on starches,  Eating oikos greek yogurt  And cheese sticks for breakfast. Skips lunch a lot.  Dinner is grilled or baked chicken/fish and a Engineer, agricultural Value Date   HGBA1C 10.0 (H) 08/17/2016     Severe Headaches were greatly reduced while out of work (3 in 9 weeks)  Has been back to work for the past 10 days,  Had to leave work early for one headache.      Outpatient Medications Prior to Visit  Medication Sig Dispense Refill  . albuterol (PROVENTIL HFA;VENTOLIN HFA) 108 (90 BASE) MCG/ACT inhaler Inhale 2 puffs into the lungs every 6 (six) hours as needed for wheezing or shortness of breath. One puff as needed 3.7 g 1  . aspirin EC 81 MG tablet Take 81 mg by mouth daily.    [name] atorvastatin (LIPITOR) 20 MG tablet Take 1 tablet (20 mg total) by mouth daily. 90 tablet 3  . diazepam (VALIUM) 10 MG tablet Take 1 tablet (10 mg total) by mouth every 12 (twelve) hours as needed (muscle  spasm). 30 tablet 1  . EPINEPHrine 0.3 mg/0.3 mL IJ SOAJ injection   3  . ketorolac (TORADOL) 10 MG tablet Take 1 tablet (10 mg total) by mouth every 6 (six) hours as needed. 30 tablet 1  . liraglutide 18 MG/3ML SOPN 0.6 mg  SubQ  once daily for 1 week;  increase to 1.2 mg once daily;  may increase  to 1.8 mg once daily 9 mL 2  . losartan-hydrochlorothiazide (HYZAAR) 50-12.5 MG tablet Take 1 tablet by mouth daily. 90 tablet 3  . meclizine (ANTIVERT) 25 MG tablet Take 25 mg by mouth 3 (three) times daily as needed for dizziness.    . methocarbamol (ROBAXIN) 500 MG tablet Take 1 tablet (500 mg total) by mouth every 8 (eight) hours as needed for muscle spasms. 40 tablet 0  . metoprolol succinate (TOPROL-XL) 50 MG 24 hr tablet TAKE 1 TABLET BY MOUTH DAILY. TAKE WITH OR IMMEDIATELY FOLLOWING A MEAL. 90 tablet 1  . ondansetron (ZOFRAN) 4 MG tablet Take 1 tablet (4 mg total) by mouth every 8 (eight) hours as needed for nausea or vomiting. 10 tablet 0  . oxyCODONE-acetaminophen (ROXICET) 5-325 MG tablet Take 1-2 tablets by mouth every 4 (four) hours as needed for severe pain. 40 tablet 0  . UNIFINE PENTIPS 31G X 5  MM MISC   0  . esomeprazole (NEXIUM) 20 MG capsule Take 20 mg by mouth daily at 12 noon.    . montelukast (SINGULAIR) 10 MG tablet Take 1 tablet (10 mg total) by mouth at bedtime. (Patient not taking: Reported on 09/27/2016) 30 tablet 3   No facility-administered medications prior to visit.     Review of Systems;  Patient denies headache, fevers, malaise, unintentional weight loss, skin rash, eye pain, sinus congestion and sinus pain, sore throat, dysphagia,  hemoptysis , cough, dyspnea, wheezing, chest pain, palpitations, orthopnea, edema, abdominal pain, nausea, melena, diarrhea, constipation, flank pain, dysuria, hematuria, urinary  Frequency, nocturia, numbness, tingling, seizures,  Focal weakness, Loss of consciousness,  Tremor, insomnia, depression, anxiety, and suicidal ideation.       Objective:  BP 102/76   Pulse 84   Temp 98 F (36.7 C) (Oral)   Resp 16   Ht 5\' 9"  (1.753 m)   Wt 216 lb 12.8 oz (98.3 kg)   SpO2 99%   BMI 32.02 kg/m   BP Readings from Last 3 Encounters:  09/27/16 102/76  09/26/16 (!) 133/93  09/13/16 (!) 138/97    Wt Readings from Last 3 Encounters:  09/27/16 216 lb 12.8 oz (98.3 kg)  09/26/16 200 lb (90.7 kg)  09/13/16 213 lb (96.6 kg)    General appearance: alert, cooperative and appears stated age Ears: normal TM's and external ear canals both ears Throat: lips, mucosa, and tongue normal; teeth and gums normal Neck: no adenopathy, no carotid bruit, supple, symmetrical, trachea midline and thyroid not enlarged, symmetric, no tenderness/mass/nodules Back: symmetric, no curvature. ROM normal. No CVA tenderness. Lungs: clear to auscultation bilaterally Heart: regular rate and rhythm, S1, S2 normal, no murmur, click, rub or gallop Abdomen: soft, non-tender; bowel sounds normal; no masses,  no organomegaly Pulses: 2+ and symmetric Skin: Skin color, texture, turgor normal. No rashes or lesions Lymph nodes: Cervical, supraclavicular, and axillary nodes normal.  Lab Results  Component Value Date   HGBA1C 10.0 (H) 08/17/2016   HGBA1C 8.4 (H) 05/18/2016   HGBA1C 6.6 (H) 07/24/2015    Lab Results  Component Value Date   CREATININE 0.89 08/17/2016   CREATININE 0.93 07/11/2016   CREATININE 0.96 05/18/2016    Lab Results  Component Value Date   WBC 7.7 10/04/2015   HGB 13.5 10/04/2015   HCT 39.8 10/04/2015   PLT 173 10/04/2015   GLUCOSE 219 (H) 08/17/2016   CHOL 160 08/17/2016   TRIG 132.0 08/17/2016   HDL 38.80 (L) 08/17/2016   LDLDIRECT 98.0 08/17/2016   LDLCALC 95 08/17/2016   ALT 34 08/17/2016   AST 23 08/17/2016   NA 137 08/17/2016   K 3.9 08/17/2016   CL 99 08/17/2016   CREATININE 0.89 08/17/2016   BUN 17 08/17/2016   CO2 25 08/17/2016   TSH 0.619 10/16/2014   HGBA1C 10.0 (H) 08/17/2016   MICROALBUR 2.4 (H)  05/18/2016    No results found.  Assessment & Plan:   Problem List Items Addressed This Visit    Hypertension goal BP (blood pressure) < 130/80    Well controlled on current regimen. Renal function stable, no changes today.  Lab Results  Component Value Date   CREATININE 0.89 08/17/2016   Lab Results  Component Value Date   NA 137 08/17/2016   K 3.9 08/17/2016   CL 99 08/17/2016   CO2 25 08/17/2016         Uncontrolled type 2 diabetes mellitus ([facility])  victoza added in March for A1c 10.0 did not tolerate metformin due to nausea,  But stopped it after one dose.          Other Visit Diagnoses    Long-term use of high-risk medication    -  Primary   Relevant Orders   Comprehensive metabolic panel   Lipase      I have discontinued Ms. Orbach's montelukast and esomeprazole. I am also having her maintain her meclizine, albuterol, diazepam, ondansetron, ketorolac, atorvastatin, EPINEPHrine, losartan-hydrochlorothiazide, liraglutide, methocarbamol, UNIFINE PENTIPS, aspirin EC, metoprolol succinate, and oxyCODONE-acetaminophen.  No orders of the defined types were placed in this encounter.   Medications Discontinued During This Encounter  Medication Reason  . esomeprazole (NEXIUM) 20 MG capsule Patient has not taken in last 30 days  . montelukast (SINGULAIR) 10 MG tablet Patient has not taken in last 30 days    Follow-up: Return in about 8 weeks (around 11/23/2016) for fasting labs first ,  after June 15, , follow up diabetes.   Crecencio Mc, MD

## 2016-09-27 NOTE — Patient Instructions (Signed)
Don't skip meals!  It slows down your metabolism   Remember that exercise improves your blood sugars by increaseng your body's sensitivity to your own insulin   Let me know if your post prandials are > 160 and if your fastings are > 120   Repeat labs due on or after June 15,  Please schedule this and an OV

## 2016-09-27 NOTE — Progress Notes (Signed)
Pre visit review using our clinic review tool, if applicable. No additional management support is needed unless otherwise documented below in the visit note. 

## 2016-09-28 DIAGNOSIS — M25511 Pain in right shoulder: Secondary | ICD-10-CM | POA: Diagnosis not present

## 2016-09-28 DIAGNOSIS — M6281 Muscle weakness (generalized): Secondary | ICD-10-CM | POA: Diagnosis not present

## 2016-10-02 NOTE — Assessment & Plan Note (Addendum)
victoza added in March for A1c 10.0 did not tolerate metformin due to nausea,  But stopped it after one dose.

## 2016-10-02 NOTE — Assessment & Plan Note (Signed)
Well controlled on current regimen. Renal function stable, no changes today.  Lab Results  Component Value Date   CREATININE 0.89 08/17/2016   Lab Results  Component Value Date   NA 137 08/17/2016   K 3.9 08/17/2016   CL 99 08/17/2016   CO2 25 08/17/2016

## 2016-10-03 DIAGNOSIS — M6281 Muscle weakness (generalized): Secondary | ICD-10-CM | POA: Diagnosis not present

## 2016-10-03 DIAGNOSIS — M25511 Pain in right shoulder: Secondary | ICD-10-CM | POA: Diagnosis not present

## 2016-10-12 DIAGNOSIS — M6281 Muscle weakness (generalized): Secondary | ICD-10-CM | POA: Diagnosis not present

## 2016-10-12 DIAGNOSIS — M25511 Pain in right shoulder: Secondary | ICD-10-CM | POA: Diagnosis not present

## 2016-10-12 DIAGNOSIS — Z79899 Other long term (current) drug therapy: Secondary | ICD-10-CM | POA: Diagnosis not present

## 2016-10-17 DIAGNOSIS — M25511 Pain in right shoulder: Secondary | ICD-10-CM | POA: Diagnosis not present

## 2016-10-17 DIAGNOSIS — M6281 Muscle weakness (generalized): Secondary | ICD-10-CM | POA: Diagnosis not present

## 2016-10-19 DIAGNOSIS — M6281 Muscle weakness (generalized): Secondary | ICD-10-CM | POA: Diagnosis not present

## 2016-10-19 DIAGNOSIS — M25511 Pain in right shoulder: Secondary | ICD-10-CM | POA: Diagnosis not present

## 2016-10-19 DIAGNOSIS — Z79899 Other long term (current) drug therapy: Secondary | ICD-10-CM | POA: Diagnosis not present

## 2016-10-21 ENCOUNTER — Encounter (INDEPENDENT_AMBULATORY_CARE_PROVIDER_SITE_OTHER): Payer: Self-pay | Admitting: Orthopaedic Surgery

## 2016-10-21 ENCOUNTER — Ambulatory Visit (INDEPENDENT_AMBULATORY_CARE_PROVIDER_SITE_OTHER): Payer: 59 | Admitting: Orthopaedic Surgery

## 2016-10-21 VITALS — BP 115/75 | HR 75 | Resp 14 | Ht 69.0 in | Wt 219.0 lb

## 2016-10-21 DIAGNOSIS — G8929 Other chronic pain: Secondary | ICD-10-CM

## 2016-10-21 DIAGNOSIS — M25511 Pain in right shoulder: Secondary | ICD-10-CM

## 2016-10-21 NOTE — Progress Notes (Signed)
Office Visit Note   Patient: [name]           Date of Birth: 15-May-1971           MRN: [id number] Visit Date: 10/21/2016              Requested by: Stephanie Mc, MD [address] [place], [place] [address] PCP: Stephanie Mc, MD   Assessment & Plan: Visit Diagnoses:  1. Chronic right shoulder pain   Status post rotator cuff tear repair. Developed adhesive capsulitis postoperatively possibly related to her diabetes. She is over a month status post manipulation and doing very well  Plan: Continue with home exercise program and light weights to see her back as needed. I'm happy with her progress.  Follow-Up Instructions: Return if symptoms worsen or fail to improve.   Orders:  No orders of the defined types were placed in this encounter.  No orders of the defined types were placed in this encounter.     Procedures: No procedures performed   Clinical Data: No additional findings.   Subjective: Chief Complaint  Patient presents with  . Right Shoulder - Routine Post Op    [name] is a 46 y o status post 6 weeks Right shoulder adhesive capsulitis  [name] over months status post manipulation of her right shoulder forward he's of capsulitis developed after her rotator cuff tear repair. She notes that she is "at least 98% better. Not having any pain. No numbness or tingling. She is working with light weights to strengthen her shoulder. Presently happy with her course  HPI  Review of Systems   Objective: Vital Signs: BP 115/75   Pulse 75   Resp 14   Ht 5\' 9"  (1.753 m)   Wt 219 lb (99.3 kg)   BMI 32.34 kg/m   Physical Exam  Ortho Exam quick overhead motion right shoulder without limitation of flexion. Some weakness in abduction. Skin intact. Biceps intact. Some loss of internal rotation as she is unable touch the middle of her back but can touch the lumbosacral junction. Good grip and release. Arthroscopic and rotator cuff tear repair  incisions healing nicely. No pain to range of motion of cervical spine.  Specialty Comments:  No specialty comments available.  Imaging: No results found.   PMFS History: Patient Active Problem List   Diagnosis Date Noted  . Nontraumatic incomplete tear of right rotator cuff 07/14/2016  . AC (acromioclavicular) arthritis 07/14/2016  . Impingement syndrome of right shoulder 07/14/2016  . Fibrocystic breast changes, right 05/21/2016  . Menopausal symptoms 10/30/2015  . Insomnia 10/16/2015  . Snoring 07/26/2015  . Migraine without aura and without status migrainosus, not intractable 02/24/2015  . Muscle spasm 02/24/2015  . Facial paresthesia 02/24/2015  . Concussion with loss of consciousness 07/21/2014  . Hyperlipidemia associated with type 2 diabetes mellitus ([facility]) 06/13/2014  . Pain in joint, shoulder region 08/11/2013  . Vitamin D deficiency 05/07/2013  . Chronic migraine 11/20/2012  . S/P Total Abdominal Hysterectomy and Left Salpingo-oophorectomy 10/20/2011  . Headache disorder   . Uncontrolled type 2 diabetes mellitus ([facility]) 07/20/2011  . Obesity (BMI 30-39.9) 07/20/2011  . Hypertension goal BP (blood pressure) < 130/80 07/20/2011   Past Medical History:  Diagnosis Date  . Allergy   . Anemia   . Anxiety    claustrophobic  . Asthma   . Diabetes mellitus 2011   did not start metforfin, losing weight  . Headache disorder   .  Hypertension   . Shoulder impingement syndrome, right     Family History  Problem Relation Age of Onset  . Diabetes Mother   . Heart disease Mother   . Hypertension Mother   . Hyperlipidemia Mother   . Cancer Father 67       Lung Cancer  . Mental illness Sister        bipolar, substance abuse,  clean 2 yrs  . Hypertension Sister   . Cancer Paternal Grandmother 25       breast cancer  . Diabetes Son     Past Surgical History:  Procedure Laterality Date  . ABDOMINAL HYSTERECTOMY  2006   heavy menses, endometriosis, l oophrectomy  .  BACK SURGERY    . BREAST SURGERY     right breast x 2 , benign  . HERNIA REPAIR  2003   left inguinal   . LEFT OOPHORECTOMY    . SHOULDER ARTHROSCOPY WITH SUBACROMIAL DECOMPRESSION AND OPEN ROTATOR C Right 07/14/2016   Procedure: RIGHT SHOULDER ARTHROSCOPY WITH SUBACROMIAL DECOMPRESSION, DISTAL CLAVICLE RESECTION AND MINI OPEN ROTATOR CUFF REPAIR, OPEN BICEP TENDODESIS;  Surgeon: Stephanie Balding, MD;  Location: [facility];  Service: Orthopedics;  Laterality: Right;  . SHOULDER CLOSED REDUCTION Right 09/08/2016   Procedure: RIGHT CLOSED MANIPULATION SHOULDER;  Surgeon: Stephanie Balding, MD;  Location: [facility];  Service: Orthopedics;  Laterality: Right;  . TUBAL LIGATION     Social History   Occupational History  . Not on file.   Social History Main Topics  . Smoking status: Never Smoker  . Smokeless tobacco: Never Used  . Alcohol use No  . Drug use: No  . Sexual activity: Not Currently    Partners: Male    Birth control/ protection: Surgical     Stephanie Balding, MD   Note - This record has been created using [employer].  Chart creation errors have been sought, but may not always  have been located. Such creation errors do not reflect on  the standard of medical care.

## 2016-10-24 DIAGNOSIS — M25511 Pain in right shoulder: Secondary | ICD-10-CM | POA: Diagnosis not present

## 2016-10-24 DIAGNOSIS — M6281 Muscle weakness (generalized): Secondary | ICD-10-CM | POA: Diagnosis not present

## 2016-10-24 MED FILL — VICTOZA 18 MG/3 ML INJECT P: 18 | 30 days supply | Qty: 9 | Fill #0

## 2016-11-02 ENCOUNTER — Telehealth: Payer: Self-pay | Admitting: Internal Medicine

## 2016-11-02 ENCOUNTER — Emergency Department (HOSPITAL_COMMUNITY): Payer: 59

## 2016-11-02 ENCOUNTER — Encounter (HOSPITAL_COMMUNITY): Payer: Self-pay | Admitting: Emergency Medicine

## 2016-11-02 ENCOUNTER — Emergency Department (HOSPITAL_COMMUNITY)
Admission: EM | Admit: 2016-11-02 | Discharge: 2016-11-02 | Disposition: A | Discharge status: Home / Self Care | Payer: 59 | Attending: Emergency Medicine | Admitting: Emergency Medicine

## 2016-11-02 DIAGNOSIS — Z79899 Other long term (current) drug therapy: Secondary | ICD-10-CM | POA: Insufficient documentation

## 2016-11-02 DIAGNOSIS — R519 Headache, unspecified: Secondary | ICD-10-CM

## 2016-11-02 DIAGNOSIS — Z91040 Latex allergy status: Secondary | ICD-10-CM | POA: Diagnosis not present

## 2016-11-02 DIAGNOSIS — R002 Palpitations: Secondary | ICD-10-CM | POA: Insufficient documentation

## 2016-11-02 DIAGNOSIS — E119 Type 2 diabetes mellitus without complications: Secondary | ICD-10-CM | POA: Insufficient documentation

## 2016-11-02 DIAGNOSIS — R51 Headache: Secondary | ICD-10-CM | POA: Insufficient documentation

## 2016-11-02 DIAGNOSIS — I10 Essential (primary) hypertension: Secondary | ICD-10-CM | POA: Insufficient documentation

## 2016-11-02 DIAGNOSIS — J45909 Unspecified asthma, uncomplicated: Secondary | ICD-10-CM | POA: Insufficient documentation

## 2016-11-02 DIAGNOSIS — R0789 Other chest pain: Secondary | ICD-10-CM | POA: Diagnosis not present

## 2016-11-02 DIAGNOSIS — Z7982 Long term (current) use of aspirin: Secondary | ICD-10-CM | POA: Insufficient documentation

## 2016-11-02 LAB — D-DIMER, QUANTITATIVE (NOT AT ARMC): D-Dimer, Quant: <0.27 ug/mL-FEU (ref 0.00–0.50)

## 2016-11-02 LAB — BASIC METABOLIC PANEL
Anion gap: 8 (ref 5–15)
BUN: 12 mg/dL (ref 6–20)
CO2: 25 mmol/L (ref 22–32)
Calcium: 9.4 mg/dL (ref 8.9–10.3)
Chloride: 105 mmol/L (ref 101–111)
Creatinine, Ser: 0.81 mg/dL (ref 0.44–1.00)
GFR calc Af Amer: >60 mL/min (ref >60)
GFR calc non Af Amer: >60 mL/min (ref >60)
Glucose, Bld: 158 mg/dL — ABNORMAL HIGH (ref 65–99)
Potassium: 3.5 mmol/L (ref 3.5–5.1)
Sodium: 138 mmol/L (ref 135–145)

## 2016-11-02 LAB — CBC
HCT: 39.1 % (ref 36.0–46.0)
Hemoglobin: 12.7 g/dL (ref 12.0–15.0)
MCH: 29.3 pg (ref 26.0–34.0)
MCHC: 32.5 g/dL (ref 30.0–36.0)
MCV: 90.1 fL (ref 78.0–100.0)
Platelets: 183 10*3/uL (ref 150–400)
RBC: 4.34 MIL/uL (ref 3.87–5.11)
RDW: 13.1 % (ref 11.5–15.5)
WBC: 7.8 10*3/uL (ref 4.0–10.5)

## 2016-11-02 LAB — I-STAT TROPONIN, ED
Troponin i, poc: 0 ng/mL (ref 0.00–0.08)
Troponin i, poc: 0 ng/mL (ref 0.00–0.08)

## 2016-11-02 MED ORDER — SUCRALFATE 1 G PO TABS
1.0000 g | ORAL_TABLET | Freq: Once | ORAL | Status: AC
  Administered 2016-11-02: 1 g via ORAL
  Filled 2016-11-02: qty 1

## 2016-11-02 MED ORDER — GI COCKTAIL ~~LOC~~
30.0000 mL | Freq: Once | ORAL | Status: AC
  Administered 2016-11-02: 30 mL via ORAL
  Filled 2016-11-02: qty 30

## 2016-11-02 MED ORDER — OXYCODONE-ACETAMINOPHEN 5-325 MG PO TABS
ORAL_TABLET | ORAL | Status: AC
  Filled 2016-11-02: qty 1

## 2016-11-02 MED ORDER — OXYCODONE-ACETAMINOPHEN 5-325 MG PO TABS
1.0000 | ORAL_TABLET | Freq: Once | ORAL | Status: AC
  Administered 2016-11-02: 1 via ORAL

## 2016-11-02 MED ORDER — KETOROLAC TROMETHAMINE 30 MG/ML IJ SOLN
30.0000 mg | Freq: Once | INTRAMUSCULAR | Status: AC
  Administered 2016-11-02: 30 mg via INTRAVENOUS
  Filled 2016-11-02: qty 1

## 2016-11-02 NOTE — ED Provider Notes (Signed)
[facility] DEPT Provider Note   CSN: [id number] Arrival date & time: 11/02/16  1405     History   Chief Complaint Chief Complaint  Patient presents with  . Palpitations    HPI [name] is a 46 y.o. female.  HPI [name] is a 46 y.o. female with a history of asthma, anxiety, diabetes, hypertension, presents to emergency department complaining of palpitations and acid reflux symptoms. Patient states she was at work, she works as a Marine scientist, when she began feeling palpitations. She states symptoms lasted approximately 30 minutes. She states she felt like her heart was racing. She reports associated headache that started this morning as well. She denies any Shortness of breath. She denied any chest pain until she got to the emergency department. She did have some acid reflux symptoms earlier but attributes that to eating a cajuan chicken sandwich for breakfast. She states while in the waiting room she developed some chest tightness. She was given a Percocet which did not help her headache or chest pain. Patient denies any recent travel. No history of blood clots. No swelling in extremities. She has had hysterectomy in the past, does not take any hormone replacement. She did have shoulder surgery 2 months ago. Denies increased caffeine intake Does not take any supplements    Past Medical History:  Diagnosis Date  . Allergy   . Anemia   . Anxiety    claustrophobic  . Asthma   . Diabetes mellitus 2011   did not start metforfin, losing weight  . Headache disorder   . Hypertension   . Shoulder impingement syndrome, right     Patient Active Problem List   Diagnosis Date Noted  . Nontraumatic incomplete tear of right rotator cuff 07/14/2016  . AC (acromioclavicular) arthritis 07/14/2016  . Impingement syndrome of right shoulder 07/14/2016  . Fibrocystic breast changes, right 05/21/2016  . Menopausal symptoms 10/30/2015  . Insomnia 10/16/2015  . Snoring 07/26/2015  .  Migraine without aura and without status migrainosus, not intractable 02/24/2015  . Muscle spasm 02/24/2015  . Facial paresthesia 02/24/2015  . Concussion with loss of consciousness 07/21/2014  . Hyperlipidemia associated with type 2 diabetes mellitus ([place]) 06/13/2014  . Pain in joint, shoulder region 08/11/2013  . Vitamin D deficiency 05/07/2013  . Chronic migraine 11/20/2012  . S/P Total Abdominal Hysterectomy and Left Salpingo-oophorectomy 10/20/2011  . Headache disorder   . Uncontrolled type 2 diabetes mellitus ([place]) 07/20/2011  . Obesity (BMI 30-39.9) 07/20/2011  . Hypertension goal BP (blood pressure) < 130/80 07/20/2011    Past Surgical History:  Procedure Laterality Date  . ABDOMINAL HYSTERECTOMY  2006   heavy menses, endometriosis, l oophrectomy  . BACK SURGERY    . BREAST SURGERY     right breast x 2 , benign  . HERNIA REPAIR  2003   left inguinal   . LEFT OOPHORECTOMY    . SHOULDER ARTHROSCOPY WITH SUBACROMIAL DECOMPRESSION AND OPEN ROTATOR C Right 07/14/2016   Procedure: RIGHT SHOULDER ARTHROSCOPY WITH SUBACROMIAL DECOMPRESSION, DISTAL CLAVICLE RESECTION AND MINI OPEN ROTATOR CUFF REPAIR, OPEN BICEP TENDODESIS;  Surgeon: Garald Balding, MD;  Location: [facility];  Service: Orthopedics;  Laterality: Right;  . SHOULDER CLOSED REDUCTION Right 09/08/2016   Procedure: RIGHT CLOSED MANIPULATION SHOULDER;  Surgeon: Garald Balding, MD;  Location: [facility];  Service: Orthopedics;  Laterality: Right;  . TUBAL LIGATION      OB History    Gravida Para Term Preterm AB  Living   3 2 2   1 2    SAB TAB Ectopic Multiple Live Births       1   2       Home Medications    Prior to Admission medications   Medication Sig Start Date End Date Taking? Authorizing Provider  albuterol (PROVENTIL HFA;VENTOLIN HFA) 108 (90 BASE) MCG/ACT inhaler Inhale 2 puffs into the lungs every 6 (six) hours as needed for wheezing or shortness of breath. One puff as  needed 01/19/15   Crecencio Mc, MD  aspirin EC 81 MG tablet Take 81 mg by mouth daily.    [provider]  atorvastatin (LIPITOR) 20 MG tablet Take 1 tablet (20 mg total) by mouth daily. 05/23/16   Crecencio Mc, MD  diazepam (VALIUM) 10 MG tablet Take 1 tablet (10 mg total) by mouth every 12 (twelve) hours as needed (muscle spasm). 01/19/15   Crecencio Mc, MD  EPINEPHrine 0.3 mg/0.3 mL IJ SOAJ injection  07/22/16   [provider]  ketorolac (TORADOL) 10 MG tablet Take 1 tablet (10 mg total) by mouth every 6 (six) hours as needed. 02/16/16   Anyanwu, Sallyanne Havers, MD  liraglutide 18 MG/3ML SOPN 0.6 mg  SubQ  once daily for 1 week;  increase to 1.2 mg once daily;  may increase  to 1.8 mg once daily 08/18/16   Crecencio Mc, MD  losartan-hydrochlorothiazide (HYZAAR) 50-12.5 MG tablet Take 1 tablet by mouth daily. 08/17/16   Crecencio Mc, MD  meclizine (ANTIVERT) 25 MG tablet Take 25 mg by mouth 3 (three) times daily as needed for dizziness.    [provider]  methocarbamol (ROBAXIN) 500 MG tablet Take 1 tablet (500 mg total) by mouth every 8 (eight) hours as needed for muscle spasms. Patient not taking: Reported on 10/21/2016 08/24/16   Biagio Borg D, PA-C  metoprolol succinate (TOPROL-XL) 50 MG 24 hr tablet TAKE 1 TABLET BY MOUTH DAILY. TAKE WITH OR IMMEDIATELY FOLLOWING A MEAL. 09/05/16   Crecencio Mc, MD  ondansetron (ZOFRAN) 4 MG tablet Take 1 tablet (4 mg total) by mouth every 8 (eight) hours as needed for nausea or vomiting. 10/04/15   Lisa Roca, MD  oxyCODONE-acetaminophen (ROXICET) 5-325 MG tablet Take 1-2 tablets by mouth every 4 (four) hours as needed for severe pain. Patient not taking: Reported on 10/21/2016 09/08/16   Cherylann Ratel, PA-C  UNIFINE PENTIPS 31G X 5 MM MISC  08/19/16   [provider]    Family History Family History  Problem Relation Age of Onset  . Diabetes Mother   . Heart disease Mother   . Hypertension Mother   .  Hyperlipidemia Mother   . Cancer Father 71       Lung Cancer  . Mental illness Sister        bipolar, substance abuse,  clean 2 yrs  . Hypertension Sister   . Cancer Paternal Grandmother 25       breast cancer  . Diabetes Son     Social History Social History  Substance Use Topics  . Smoking status: Never Smoker  . Smokeless tobacco: Never Used  . Alcohol use No     Allergies   Botox [onabotulinumtoxina]; Clindamycin/lincomycin; Kiwi extract; Maxalt [rizatriptan benzoate]; Nitrous oxide; Triptans; Aspirin; Flagyl [metronidazole]; Hydrocodone-acetaminophen; Latex; Mango flavor; Rizatriptan; Tetracycline; Vicodin [hydrocodone-acetaminophen]; Penicillins; and Tetracyclines & related   Review of Systems Review of Systems  Constitutional: Negative for chills and fever.  Respiratory:  Negative for cough, chest tightness and shortness of breath.   Cardiovascular: Positive for chest pain and palpitations. Negative for leg swelling.  Gastrointestinal: Negative for abdominal pain, diarrhea, nausea and vomiting.  Genitourinary: Negative for dysuria, flank pain, pelvic pain, vaginal bleeding, vaginal discharge and vaginal pain.  Musculoskeletal: Negative for arthralgias, myalgias, neck pain and neck stiffness.  Skin: Negative for rash.  Neurological: Positive for headaches. Negative for dizziness and weakness.  All other systems reviewed and are negative.    Physical Exam Updated Vital Signs BP 128/84   Pulse 73   Temp 98 F (36.7 C) (Oral)   Resp 12   SpO2 100%   Physical Exam  Constitutional: She appears well-developed and well-nourished. No distress.  HENT:  Head: Normocephalic.  Eyes: Conjunctivae are normal.  Neck: Neck supple.  Cardiovascular: Normal rate, regular rhythm and normal heart sounds.   Pulmonary/Chest: Effort normal and breath sounds normal. No respiratory distress. She has no wheezes. She has no rales.  Abdominal: Soft. Bowel sounds are normal. She  exhibits no distension. There is no tenderness. There is no rebound.  Musculoskeletal: She exhibits no edema.  Neurological: She is alert.  Skin: Skin is warm and dry.  Psychiatric: She has a normal mood and affect. Her behavior is normal.  Nursing note and vitals reviewed.    ED Treatments / Results  Labs (all labs ordered are listed, but only abnormal results are displayed) Labs Reviewed  BASIC METABOLIC PANEL - Abnormal; Notable for the following:       Result Value   Glucose, Bld 158 (*)    All other components within normal limits  CBC  D-DIMER, QUANTITATIVE (NOT AT [facility])  I-STAT TROPOININ, ED  I-STAT TROPOININ, ED    EKG  EKG Interpretation  Date/Time:  Wednesday Nov 02 2016 14:14:28 EDT Ventricular Rate:  95 PR Interval:  166 QRS Duration: 86 QT Interval:  352 QTC Calculation: 442 R Axis:   36 Text Interpretation:  Normal sinus rhythm Minimal voltage criteria for LVH, may be normal variant Cannot rule out Anterior infarct , age undetermined Abnormal ECG [id number] No STEMI Confirmed by Antony Blackbird [phone]) on 11/02/2016 8:55:32 PM       Radiology Dg Chest 2 View  Result Date: 11/02/2016 CLINICAL DATA:  Nonsmoker. Chest tightness, with shortness of breath. EXAM: CHEST  2 VIEW COMPARISON:  None. FINDINGS: The heart size and mediastinal contours are within normal limits. Both lungs are clear. The visualized skeletal structures are unremarkable. IMPRESSION: No active cardiopulmonary disease. Electronically Signed   By: Staci Righter M.D.   On: 11/02/2016 14:37    Procedures Procedures (including critical care time)  Medications Ordered in ED Medications  oxyCODONE-acetaminophen (PERCOCET/ROXICET) 5-325 MG per tablet (not administered)  oxyCODONE-acetaminophen (PERCOCET/ROXICET) 5-325 MG per tablet 1 tablet (1 tablet Oral Given 11/02/16 1731)  ketorolac (TORADOL) 30 MG/ML injection 30 mg (30 mg Intravenous Given 11/02/16 1947)  gi cocktail (Maalox,Lidocaine,Donnatal)  (30 mLs Oral Given 11/02/16 1947)  sucralfate (CARAFATE) tablet 1 g (1 g Oral Given 11/02/16 2056)     Initial Impression / Assessment and Plan / ED Course  I have reviewed the triage vital signs and the nursing notes.  Pertinent labs & imaging results that were available during my care of the patient were reviewed by me and considered in my medical decision making (see chart for details).     Pt in ED with palpitations. Also having some chest discomfort which she describes that as acid reflux and started  after eating cajuan chicken. Patient's chest pain is certainly atypical. It is nonexertional. It feels more like acid reflux and states it radiates into her throat. I will try GI cocktail. Her heart score is 2. No prior cardiac problems. EKG with no acute ACS findings, however she does have S1Q3 T3, which is concerning for right heart strain and certainly with her symptoms could suggest a pulmonary embolism. I will add d-dimer to her blood work. We will try Toradol for headache.  9:38 PM Patient's d-dimer is negative. Repeat troponin showed value of 0.00. I discussed this patient with Dr. Sherry Ruffing, who agreed with evaluation and the plan. We'll discharge her home, follow with primary care doctor. I will give her a follow-up with cardiology if continues to have palpitations for possible Holter monitor. Return precautions discussed.  Vitals:   11/02/16 2030 11/02/16 2045 11/02/16 2100 11/02/16 2115  BP: 115/61 109/74 123/86 126/79  Pulse: 68 76 73 68  Resp: 12 11 14 13   Temp:      TempSrc:      SpO2: 99% 99% 98% 97%     Final Clinical Impressions(s) / ED Diagnoses   Final diagnoses:  Palpitations  Nonintractable headache, unspecified chronicity pattern, unspecified headache type    New Prescriptions New Prescriptions   No medications on file     Jeannett Senior, Hershal Coria 11/02/16 2142    Tegeler, Gwenyth Allegra, MD 11/03/16 [phone]

## 2016-11-02 NOTE — Discharge Instructions (Signed)
Drink plenty of fluids. Rest. If continued to have palpitations, please follow-up with a cardiologist. Otherwise follow up with family doctor.

## 2016-11-02 NOTE — ED Triage Notes (Signed)
Pt sts palpitations while at work with HA; pt here for eval; pt sts tachy compared to normal

## 2016-11-02 NOTE — Telephone Encounter (Signed)
Confirmed patient is at ED now. FYI

## 2016-11-02 NOTE — Telephone Encounter (Signed)
Patient Name: [name]  DOB: 1970/10/31    Initial Comment Caller states she's having heart palpations. She's having heartburn, headache. Chest discomfort, shortness of breath.   Nurse Assessment  Nurse: Thad Ranger RN, Langley Gauss Date/Time (Eastern Time): 11/02/2016 1:50:45 PM  Confirm and document reason for call. If symptomatic, describe symptoms. ---Caller states she's having heart palpations. She's having heartburn, headache. Chest discomfort, shortness of breath.  Does the patient have any new or worsening symptoms? ---Yes  Will a triage be completed? ---Yes  Related visit to physician within the last 2 weeks? ---No  Does the PT have any chronic conditions? (i.e. diabetes, asthma, etc.) ---Yes  List chronic conditions. ---HTN, Diabetes, Migraines  Is the patient pregnant or possibly pregnant? (Ask all females between the ages of 83-55) ---No  Is this a behavioral health or substance abuse call? ---No     Guidelines    Guideline Title Affirmed Question Affirmed Notes  Heart Rate and Heartbeat Questions Difficulty breathing    Final Disposition User   Go to ED Now Thad Ranger, RN, [facility] - ED   Disagree/Comply: Comply

## 2016-11-02 NOTE — ED Notes (Signed)
Pt. To desk c/o pain in chest and a headache.

## 2016-11-02 NOTE — Telephone Encounter (Signed)
fyi

## 2016-11-02 NOTE — ED Notes (Signed)
RN added on  DDimer with main lab

## 2016-11-02 NOTE — Telephone Encounter (Signed)
Pt called about having racing heart with palpitations and heart burn. Pt was scheduled to come in on 11/07/2016. Pt was transferred to [facility]. Thank you!

## 2016-11-02 NOTE — Telephone Encounter (Signed)
Confirmed patient is at ED now.

## 2016-11-07 ENCOUNTER — Encounter: Payer: Self-pay | Admitting: Internal Medicine

## 2016-11-07 ENCOUNTER — Ambulatory Visit (INDEPENDENT_AMBULATORY_CARE_PROVIDER_SITE_OTHER): Payer: 59 | Admitting: Internal Medicine

## 2016-11-07 VITALS — BP 102/64 | HR 84 | Temp 97.9°F | Resp 16 | Ht 69.0 in | Wt 221.0 lb

## 2016-11-07 DIAGNOSIS — IMO0002 Reserved for concepts with insufficient information to code with codable children: Secondary | ICD-10-CM

## 2016-11-07 DIAGNOSIS — R1013 Epigastric pain: Secondary | ICD-10-CM

## 2016-11-07 DIAGNOSIS — G43009 Migraine without aura, not intractable, without status migrainosus: Secondary | ICD-10-CM | POA: Diagnosis not present

## 2016-11-07 DIAGNOSIS — E1165 Type 2 diabetes mellitus with hyperglycemia: Secondary | ICD-10-CM | POA: Diagnosis not present

## 2016-11-07 DIAGNOSIS — I491 Atrial premature depolarization: Secondary | ICD-10-CM

## 2016-11-07 DIAGNOSIS — E1121 Type 2 diabetes mellitus with diabetic nephropathy: Secondary | ICD-10-CM | POA: Diagnosis not present

## 2016-11-07 DIAGNOSIS — Z87898 Personal history of other specified conditions: Secondary | ICD-10-CM

## 2016-11-07 MED ORDER — DILTIAZEM HCL ER COATED BEADS 240 MG PO TB24: 240.0000 mg | ORAL_TABLET | Freq: Every day | ORAL | 3 refills | Status: DC

## 2016-11-07 NOTE — Progress Notes (Signed)
Subjective:  Patient ID: [name], female    DOB: Mar 09, 1971  Age: 46 y.o. MRN: [id number]  CC: The primary encounter diagnosis was Atrial premature depolarization. Diagnoses of Epigastric pain, Migraine without aura and without status migrainosus, not intractable, History of palpitations, and Uncontrolled type 2 diabetes mellitus with diabetic nephropathy, without long-term current use of insulin ([place]) were also pertinent to this visit.  HPI [name] presents for ER follow up .  Pateint was evaluated and treated in ER  On May 30 for rapid heart rate  Accompanied by chest tightness that started while at work  .  She had eaten a spicy chicken biscuit from [employer] earlier that day. 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  Felt heart racing,  Presyncopal,  Staff checked her pulse and it was over 100.  Sent to ER  Troponin negative,  EKG non-ischemic but S1Q3T3 noted so D dimer was added and normal.  Was treated for headache with toradol , already on PPI and metoprolol,  Sent home.    Had another episode  lasting 30 minutes on Saturday while resting on couch at home  TV.  As not accompanied by presyncope or  chest pain.  Has not eaten anything spicy since ER episode.    2) diabetes follow up:  Has been tolerating Victoza without side effects.  BS improving, post prandials are under 160. Reviewed diet,  Still eating a doerate amoutn of starches daily (breakfast biscuits,  Sandwiches from restaurants)   Outpatient Medications Prior to Visit  Medication Sig Dispense Refill  . albuterol (PROVENTIL HFA;VENTOLIN HFA) 108 (90 BASE) MCG/ACT inhaler Inhale 2 puffs into the lungs every 6 (six) hours as needed for wheezing or shortness of breath. One puff as needed 3.7 g 1  . aspirin EC 81 MG tablet Take 81 mg by mouth daily.    [name] atorvastatin (LIPITOR) 20 MG tablet Take 1 tablet (20 mg total) by mouth daily. 90 tablet 3  . diazepam (VALIUM) 10 MG tablet Take 1 tablet (10 mg total)  by mouth every 12 (twelve) hours as needed (muscle spasm). 30 tablet 1  . EPINEPHrine 0.3 mg/0.3 mL IJ SOAJ injection   3  . ketorolac (TORADOL) 10 MG tablet Take 1 tablet (10 mg total) by mouth every 6 (six) hours as needed. 30 tablet 1  . liraglutide 18 MG/3ML SOPN 0.6 mg  SubQ  once daily for 1 week;  increase to 1.2 mg once daily;  may increase  to 1.8 mg once daily 9 mL 2  . losartan-hydrochlorothiazide (HYZAAR) 50-12.5 MG tablet Take 1 tablet by mouth daily. 90 tablet 3  . meclizine (ANTIVERT) 25 MG tablet Take 25 mg by mouth 3 (three) times daily as needed for dizziness.    . methocarbamol (ROBAXIN) 500 MG tablet Take 1 tablet (500 mg total) by mouth every 8 (eight) hours as needed for muscle spasms. 40 tablet 0  . metoprolol succinate (TOPROL-XL) 50 MG 24 hr tablet TAKE 1 TABLET BY MOUTH DAILY. TAKE WITH OR IMMEDIATELY FOLLOWING A MEAL. 90 tablet 1  . ondansetron (ZOFRAN) 4 MG tablet Take 1 tablet (4 mg total) by mouth every 8 (eight) hours as needed for nausea or vomiting. 10 tablet 0  . oxyCODONE-acetaminophen (ROXICET) 5-325 MG tablet Take 1-2 tablets by mouth every 4 (four) hours as needed for severe pain. 40 tablet 0  . UNIFINE PENTIPS 31G X 5 MM MISC   0   No facility-administered medications prior  to visit.     Review of Systems;  Patient denies, fevers, malaise, unintentional weight loss, skin rash, eye pain, sinus congestion and sinus pain, sore throat, dysphagia,  hemoptysis , cough, dyspnea, wheezing, , palpitations, orthopnea, edema, abdominal pain, nausea, melena, diarrhea, constipation, flank pain, dysuria, hematuria, urinary  Frequency, nocturia, numbness, tingling, seizures,  Focal weakness, Loss of consciousness,  Tremor, insomnia, depression, anxiety, and suicidal ideation.      Objective:  BP 102/64   Pulse 84   Temp 97.9 F (36.6 C) (Oral)   Resp 16   Ht 5\' 9"  (1.753 m)   Wt 221 lb (100.2 kg)   SpO2 98%   BMI 32.64 kg/m   BP Readings from Last 3  Encounters:  11/07/16 102/64  11/02/16 124/89  10/21/16 115/75    Wt Readings from Last 3 Encounters:  11/07/16 221 lb (100.2 kg)  10/21/16 219 lb (99.3 kg)  09/27/16 216 lb 12.8 oz (98.3 kg)    General appearance: alert, cooperative and appears stated age Neck: no adenopathy, no carotid bruit, supple, symmetrical, trachea midline and thyroid not enlarged, symmetric, no tenderness/mass/nodules Back: symmetric, no curvature. ROM normal. No CVA tenderness. Lungs: clear to auscultation bilaterally Heart: regular rate and rhythm, S1, S2 normal, no murmur, click, rub or gallop Abdomen: soft, non-tender; bowel sounds normal; no masses,  no organomegaly Pulses: 2+ and symmetric Skin: Skin color, texture, turgor normal. No rashes or lesions Lymph nodes: Cervical, supraclavicular, and axillary nodes normal.  Lab Results  Component Value Date   HGBA1C 10.0 (H) 08/17/2016   HGBA1C 8.4 (H) 05/18/2016   HGBA1C 6.6 (H) 07/24/2015    Lab Results  Component Value Date   CREATININE 0.81 11/02/2016   CREATININE 0.89 08/17/2016   CREATININE 0.93 07/11/2016    Lab Results  Component Value Date   WBC 7.8 11/02/2016   HGB 12.7 11/02/2016   HCT 39.1 11/02/2016   PLT 183 11/02/2016   GLUCOSE 158 (H) 11/02/2016   CHOL 160 08/17/2016   TRIG 132.0 08/17/2016   HDL 38.80 (L) 08/17/2016   LDLDIRECT 98.0 08/17/2016   LDLCALC 95 08/17/2016   ALT 34 08/17/2016   AST 23 08/17/2016   NA 138 11/02/2016   K 3.5 11/02/2016   CL 105 11/02/2016   CREATININE 0.81 11/02/2016   BUN 12 11/02/2016   CO2 25 11/02/2016   TSH 0.619 10/16/2014   HGBA1C 10.0 (H) 08/17/2016   MICROALBUR 2.4 (H) 05/18/2016    Dg Chest 2 View  Result Date: 11/02/2016 CLINICAL DATA:  Nonsmoker. Chest tightness, with shortness of breath. EXAM: CHEST  2 VIEW COMPARISON:  None. FINDINGS: The heart size and mediastinal contours are within normal limits. Both lungs are clear. The visualized skeletal structures are unremarkable.  IMPRESSION: No active cardiopulmonary disease. Electronically Signed   By: Staci Righter M.D.   On: 11/02/2016 14:37    Assessment & Plan:   Problem List Items Addressed This Visit    Uncontrolled type 2 diabetes mellitus ([place])    victoza added in March for A1c 10.0 did not tolerate metformin due to nausea,  But stopped it after one dose.     Lab Results  Component Value Date   HGBA1C 10.0 (H) 08/17/2016   RETURN IN TWO weeks for a1c/labs.      Relevant Orders   Hemoglobin A1c   Migraine without aura and without status migrainosus, not intractable    Changing metoprolol to diltiazem given concurrent runs of presumed SVT despite mg metoprolol  daily .  BP too soft to increase metoprolol      Relevant Medications   diltiazem (CARDIZEM LA) 240 MG 24 hr tablet   History of palpitations    Checking thyroid, mg and lytes.  chanigng metoprolol to diltiazem.  iF THEY PERSIST,  WILL ,  Refer to cardiology for Holter monitor or  Cardionet.        Other Visit Diagnoses    Atrial premature depolarization    -  Primary   Relevant Medications   diltiazem (CARDIZEM LA) 240 MG 24 hr tablet   Other Relevant Orders   TSH   Magnesium   Comprehensive metabolic panel   Epigastric pain       Relevant Orders   Lipase      I am having Ms. Derusha start on diltiazem. I am also having her maintain her meclizine, albuterol, diazepam, ondansetron, ketorolac, atorvastatin, EPINEPHrine, losartan-hydrochlorothiazide, liraglutide, methocarbamol, UNIFINE PENTIPS, aspirin EC, metoprolol succinate, and oxyCODONE-acetaminophen.  Meds ordered this encounter  Medications  . diltiazem (CARDIZEM LA) 240 MG 24 hr tablet    Sig: Take 1 tablet (240 mg total) by mouth daily.    Dispense:  30 tablet    Refill:  3    There are no discontinued medications.  Follow-up: No Follow-up on file.   Crecencio Mc, MD

## 2016-11-07 NOTE — Patient Instructions (Addendum)
I am Changing  your metoprolol   to cardizem for better control of palpitations and headaches without dropping BP    If they continue,  Let me know and I will order a cardiology evaluation  for Holter monitor

## 2016-11-08 DIAGNOSIS — Z87898 Personal history of other specified conditions: Secondary | ICD-10-CM | POA: Insufficient documentation

## 2016-11-08 LAB — COMPREHENSIVE METABOLIC PANEL
ALT: 18 U/L (ref 0–35)
AST: 16 U/L (ref 0–37)
Albumin: 4.4 g/dL (ref 3.5–5.2)
Alkaline Phosphatase: 94 U/L (ref 39–117)
BUN: 15 mg/dL (ref 6–23)
CO2: 28 mEq/L (ref 19–32)
Calcium: 9.6 mg/dL (ref 8.4–10.5)
Chloride: 104 mEq/L (ref 96–112)
Creatinine, Ser: 0.79 mg/dL (ref 0.40–1.20)
GFR: 100.2 mL/min (ref >60.00)
Glucose, Bld: 115 mg/dL — ABNORMAL HIGH (ref 70–99)
Potassium: 3.6 mEq/L (ref 3.5–5.1)
Sodium: 139 mEq/L (ref 135–145)
Total Bilirubin: 0.5 mg/dL (ref 0.2–1.2)
Total Protein: 7.2 g/dL (ref 6.0–8.3)

## 2016-11-08 LAB — TSH: TSH: 1.06 u[IU]/mL (ref 0.35–4.50)

## 2016-11-08 LAB — MAGNESIUM: Magnesium: 1.9 mg/dL (ref 1.5–2.5)

## 2016-11-08 LAB — LIPASE: Lipase: 52 U/L (ref 11.0–59.0)

## 2016-11-08 NOTE — Assessment & Plan Note (Signed)
victoza added in March for A1c 10.0 did not tolerate metformin due to nausea,  But stopped it after one dose.     Lab Results  Component Value Date   HGBA1C 10.0 (H) 08/17/2016   RETURN IN TWO weeks for a1c/labs.

## 2016-11-08 NOTE — Assessment & Plan Note (Signed)
Changing metoprolol to diltiazem given concurrent runs of presumed SVT despite mg metoprolol daily .  BP too soft to increase metoprolol

## 2016-11-08 NOTE — Assessment & Plan Note (Signed)
Checking thyroid, mg and lytes.  chanigng metoprolol to diltiazem.  iF THEY PERSIST,  WILL ,  Refer to cardiology for Holter monitor or  Cardionet.

## 2016-11-09 ENCOUNTER — Other Ambulatory Visit: Payer: Self-pay | Admitting: Internal Medicine

## 2016-11-09 ENCOUNTER — Encounter: Payer: Self-pay | Admitting: Internal Medicine

## 2016-11-09 DIAGNOSIS — R002 Palpitations: Secondary | ICD-10-CM

## 2016-11-17 ENCOUNTER — Other Ambulatory Visit: Payer: Self-pay | Admitting: Internal Medicine

## 2016-11-17 MED FILL — UNIFINE PENTIPS 31GX3/16": 31G X 5 MM | 90 days supply | Qty: 100 | Fill #0

## 2016-11-17 MED FILL — UNIFINE PENTIPS 31GX3/16: 31G X 5 MM | 90 days supply | Qty: 100 | Fill #0

## 2016-11-18 ENCOUNTER — Other Ambulatory Visit (INDEPENDENT_AMBULATORY_CARE_PROVIDER_SITE_OTHER): Payer: 59

## 2016-11-18 ENCOUNTER — Ambulatory Visit: Payer: 59 | Admitting: Internal Medicine

## 2016-11-18 DIAGNOSIS — E1165 Type 2 diabetes mellitus with hyperglycemia: Secondary | ICD-10-CM

## 2016-11-18 DIAGNOSIS — E1121 Type 2 diabetes mellitus with diabetic nephropathy: Secondary | ICD-10-CM | POA: Diagnosis not present

## 2016-11-18 DIAGNOSIS — IMO0002 Reserved for concepts with insufficient information to code with codable children: Secondary | ICD-10-CM

## 2016-11-18 LAB — HEMOGLOBIN A1C: Hgb A1c MFr Bld: 7 % — ABNORMAL HIGH (ref 4.6–6.5)

## 2016-11-21 MED FILL — DILTIAZEM ER 240 MG TABLET: 240 | 30 days supply | Qty: 30 | Fill #0

## 2016-11-22 ENCOUNTER — Other Ambulatory Visit: Payer: Self-pay

## 2016-11-22 ENCOUNTER — Encounter: Payer: Self-pay | Admitting: Internal Medicine

## 2016-11-22 MED ORDER — LIRAGLUTIDE 18 MG/3ML ~~LOC~~ SOPN: PEN_INJECTOR | SUBCUTANEOUS | 2 refills | Status: DC

## 2016-11-22 MED FILL — VICTOZA 18 MG/3 ML INJECT P: 18 | 30 days supply | Qty: 9 | Fill #0

## 2016-11-23 ENCOUNTER — Other Ambulatory Visit: Payer: Self-pay | Admitting: *Deleted

## 2016-11-23 ENCOUNTER — Ambulatory Visit (INDEPENDENT_AMBULATORY_CARE_PROVIDER_SITE_OTHER): Payer: 59 | Admitting: Internal Medicine

## 2016-11-23 ENCOUNTER — Encounter: Payer: Self-pay | Admitting: Internal Medicine

## 2016-11-23 DIAGNOSIS — I471 Supraventricular tachycardia: Secondary | ICD-10-CM | POA: Diagnosis not present

## 2016-11-23 DIAGNOSIS — E1121 Type 2 diabetes mellitus with diabetic nephropathy: Secondary | ICD-10-CM

## 2016-11-23 DIAGNOSIS — IMO0002 Reserved for concepts with insufficient information to code with codable children: Secondary | ICD-10-CM

## 2016-11-23 DIAGNOSIS — E1165 Type 2 diabetes mellitus with hyperglycemia: Secondary | ICD-10-CM

## 2016-11-23 DIAGNOSIS — G43709 Chronic migraine without aura, not intractable, without status migrainosus: Secondary | ICD-10-CM | POA: Diagnosis not present

## 2016-11-23 MED ORDER — LIRAGLUTIDE 18 MG/3ML ~~LOC~~ SOPN: PEN_INJECTOR | SUBCUTANEOUS | 2 refills | Status: DC

## 2016-11-23 MED FILL — LOSARTAN-HCTZ 50-12.5 MG TA: 50-12.5 | 90 days supply | Qty: 90 | Fill #0

## 2016-11-23 NOTE — Telephone Encounter (Signed)
[facility] pharmacy received a script for Victoza, the dosage has been changed to 2.4 mg daily, however this medication max dosage daily is 1.8 mg. Pharmacist suggested if previous dosage no longer works, another medication could possibly be prescribed.  Contact pharmacy [phone]

## 2016-11-23 NOTE — Patient Instructions (Signed)
I sent a new rx for generic victoza to your pharmacy for the 2.4 mg daily dose (not sure if they will cover that dose or not)  DON'T ALLOW YOURSELF TO FAST EXCEPT WHEN YOU ARE SLEEPING   Find time in your schedule for 15 minutes of cardio several days per week

## 2016-11-23 NOTE — Telephone Encounter (Signed)
Refilled: 01/19/2015 Last OV: 11/23/2016 Next OV: 02/28/2017   Also sent in the new rx for the 1.8mg  once daily victoza to the [facility] pharmacy per Dr. Lupita Dawn verbal request.

## 2016-11-23 NOTE — Progress Notes (Signed)
Subjective:  Patient ID: [name], female    DOB: 21-Mar-1971  Age: 46 y.o. MRN: [id number]  CC: Diagnoses of Uncontrolled type 2 diabetes mellitus with diabetic nephropathy, without long-term current use of insulin ([place]), Chronic migraine, and Paroxysmal supraventricular tachycardia ([place]) were pertinent to this visit.  HPI [name] presents for follow up on type 2 DM  And chest pain  occurring in the setting of palpitations  She was last seen 2 weeks for an ER follow up.  Diltiazem was started and metoprolol stopped . Tolerating diltizeam.  Having fewer palpitations  .  heacach pattern unchanged.   Uncontrolled type 2 DM.  Tolerating victoza at maximal dose.  Disappointed that she has had a plateau in her weight after losing several lbs.  BS much improved.     Outpatient Medications Prior to Visit  Medication Sig Dispense Refill  . albuterol (PROVENTIL HFA;VENTOLIN HFA) 108 (90 BASE) MCG/ACT inhaler Inhale 2 puffs into the lungs every 6 (six) hours as needed for wheezing or shortness of breath. One puff as needed 3.7 g 1  . aspirin EC 81 MG tablet Take 81 mg by mouth daily.    [name] atorvastatin (LIPITOR) 20 MG tablet Take 1 tablet (20 mg total) by mouth daily. 90 tablet 3  . diltiazem (CARDIZEM LA) 240 MG 24 hr tablet Take 1 tablet (240 mg total) by mouth daily. 30 tablet 3  . EPINEPHrine 0.3 mg/0.3 mL IJ SOAJ injection   3  . ketorolac (TORADOL) 10 MG tablet Take 1 tablet (10 mg total) by mouth every 6 (six) hours as needed. 30 tablet 1  . losartan-hydrochlorothiazide (HYZAAR) 50-12.5 MG tablet Take 1 tablet by mouth daily. 90 tablet 3  . meclizine (ANTIVERT) 25 MG tablet Take 25 mg by mouth 3 (three) times daily as needed for dizziness.    . methocarbamol (ROBAXIN) 500 MG tablet Take 1 tablet (500 mg total) by mouth every 8 (eight) hours as needed for muscle spasms. 40 tablet 0  . ondansetron (ZOFRAN) 4 MG tablet Take 1 tablet (4 mg total) by mouth every 8 (eight) hours as  needed for nausea or vomiting. 10 tablet 0  . oxyCODONE-acetaminophen (ROXICET) 5-325 MG tablet Take 1-2 tablets by mouth every 4 (four) hours as needed for severe pain. 40 tablet 0  . UNIFINE PENTIPS 31G X 5 MM MISC USE AS DIRECTED WITH VICTOZA 100 each 0  . diazepam (VALIUM) 10 MG tablet Take 1 tablet (10 mg total) by mouth every 12 (twelve) hours as needed (muscle spasm). 30 tablet 1  . liraglutide 18 MG/3ML SOPN 0.6 mg  SubQ  once daily for 1 week;  increase to 1.2 mg once daily;  may increase  to 1.8 mg once daily 9 mL 2  . metoprolol succinate (TOPROL-XL) 50 MG 24 hr tablet TAKE 1 TABLET BY MOUTH DAILY. TAKE WITH OR IMMEDIATELY FOLLOWING A MEAL. (Patient not taking: Reported on 11/23/2016) 90 tablet 1   No facility-administered medications prior to visit.     Review of Systems;  Patient denies headache, fevers, malaise, unintentional weight loss, skin rash, eye pain, sinus congestion and sinus pain, sore throat, dysphagia,  hemoptysis , cough, dyspnea, wheezing, chest pain, palpitations, orthopnea, edema, abdominal pain, nausea, melena, diarrhea, constipation, flank pain, dysuria, hematuria, urinary  Frequency, nocturia, numbness, tingling, seizures,  Focal weakness, Loss of consciousness,  Tremor, insomnia, depression, anxiety, and suicidal ideation.      Objective:  BP 112/68 (BP Location: Left Arm, Patient  Position: Sitting, Cuff Size: Large)   Pulse 79   Temp 98.3 F (36.8 C) (Oral)   Resp 15   Ht 5\' 9"  (1.753 m)   Wt 223 lb 6.4 oz (101.3 kg)   SpO2 97%   BMI 32.99 kg/m   BP Readings from Last 3 Encounters:  11/23/16 112/68  11/07/16 102/64  11/02/16 124/89    Wt Readings from Last 3 Encounters:  11/23/16 223 lb 6.4 oz (101.3 kg)  11/07/16 221 lb (100.2 kg)  10/21/16 219 lb (99.3 kg)    General appearance: alert, cooperative and appears stated age Ears: normal TM's and external ear canals both ears Throat: lips, mucosa, and tongue normal; teeth and gums  normal Neck: no adenopathy, no carotid bruit, supple, symmetrical, trachea midline and thyroid not enlarged, symmetric, no tenderness/mass/nodules Back: symmetric, no curvature. ROM normal. No CVA tenderness. Lungs: clear to auscultation bilaterally Heart: regular rate and rhythm, S1, S2 normal, no murmur, click, rub or gallop Abdomen: soft, non-tender; bowel sounds normal; no masses,  no organomegaly Pulses: 2+ and symmetric Skin: Skin color, texture, turgor normal. No rashes or lesions Lymph nodes: Cervical, supraclavicular, and axillary nodes normal.  Lab Results  Component Value Date   HGBA1C 7.0 (H) 11/18/2016   HGBA1C 10.0 (H) 08/17/2016   HGBA1C 8.4 (H) 05/18/2016    Lab Results  Component Value Date   CREATININE 0.79 11/07/2016   CREATININE 0.81 11/02/2016   CREATININE 0.89 08/17/2016    Lab Results  Component Value Date   WBC 7.8 11/02/2016   HGB 12.7 11/02/2016   HCT 39.1 11/02/2016   PLT 183 11/02/2016   GLUCOSE 115 (H) 11/07/2016   CHOL 160 08/17/2016   TRIG 132.0 08/17/2016   HDL 38.80 (L) 08/17/2016   LDLDIRECT 98.0 08/17/2016   LDLCALC 95 08/17/2016   ALT 18 11/07/2016   AST 16 11/07/2016   NA 139 11/07/2016   K 3.6 11/07/2016   CL 104 11/07/2016   CREATININE 0.79 11/07/2016   BUN 15 11/07/2016   CO2 28 11/07/2016   TSH 1.06 11/07/2016   HGBA1C 7.0 (H) 11/18/2016   MICROALBUR 2.4 (H) 05/18/2016    Dg Chest 2 View  Result Date: 11/02/2016 CLINICAL DATA:  Nonsmoker. Chest tightness, with shortness of breath. EXAM: CHEST  2 VIEW COMPARISON:  None. FINDINGS: The heart size and mediastinal contours are within normal limits. Both lungs are clear. The visualized skeletal structures are unremarkable. IMPRESSION: No active cardiopulmonary disease. Electronically Signed   By: Staci Righter M.D.   On: 11/02/2016 14:37    Assessment & Plan:   Problem List Items Addressed This Visit    Uncontrolled type 2 diabetes mellitus ([place])    Improved with addition of  victoza.  No changes today,  Diet and exercise encouraged.  Lab Results  Component Value Date   HGBA1C 7.0 (H) 11/18/2016   Lab Results  Component Value Date   MICROALBUR 2.4 (H) 05/18/2016         Paroxysmal supraventricular tachycardia ([facility])    Suspected byhistory odf symptomatic palpitations.  continue diltiazema 240 mg daily       Chronic migraine    Managed with diltiazem for prevention. Changed from  metoprolol to diltiazem given concurrent runs of presumed SVT despite maximal dose of metoprolol daily limited by hypotension        A total of 25 minutes of face to face time was spent with patient more than half of which was spent in counselling about the  above mentioned conditions  and coordination of care  I have discontinued Ms. Browe's metoprolol succinate and liraglutide. I am also having her maintain her meclizine, albuterol, ondansetron, ketorolac, atorvastatin, EPINEPHrine, losartan-hydrochlorothiazide, methocarbamol, aspirin EC, oxyCODONE-acetaminophen, diltiazem, and UNIFINE PENTIPS.  Meds ordered this encounter  Medications  . DISCONTD: liraglutide 18 MG/3ML SOPN    Sig: 2.4 mg once daily    Dispense:  12 mL    Refill:  2    Medications Discontinued During This Encounter  Medication Reason  . metoprolol succinate (TOPROL-XL) 50 MG 24 hr tablet Patient has not taken in last 30 days  . liraglutide 18 MG/3ML SOPN Reorder    Follow-up: Return in about 3 months (around 02/23/2017) for follow up diabetes.   Crecencio Mc, MD

## 2016-11-24 MED ORDER — DIAZEPAM 10 MG PO TABS: 10.0000 mg | ORAL_TABLET | Freq: Two times a day (BID) | ORAL | 3 refills | Status: DC | PRN

## 2016-11-24 NOTE — Telephone Encounter (Signed)
refilled 

## 2016-11-25 MED FILL — diazePAM 10 MG TABS: 10 | 15 days supply | Qty: 30 | Fill #0

## 2016-11-25 NOTE — Telephone Encounter (Signed)
Printed, signed and faxed.  

## 2016-11-26 DIAGNOSIS — I471 Supraventricular tachycardia, unspecified: Secondary | ICD-10-CM | POA: Insufficient documentation

## 2016-11-26 NOTE — Assessment & Plan Note (Signed)
Improved with addition of victoza.  No changes today,  Diet and exercise encouraged.  Lab Results  Component Value Date   HGBA1C 7.0 (H) 11/18/2016   Lab Results  Component Value Date   MICROALBUR 2.4 (H) 05/18/2016

## 2016-11-26 NOTE — Assessment & Plan Note (Signed)
Suspected byhistory odf symptomatic palpitations.  continue diltiazema 240 mg daily

## 2016-11-26 NOTE — Assessment & Plan Note (Signed)
Managed with diltiazem for prevention. Changed from  metoprolol to diltiazem given concurrent runs of presumed SVT despite maximal dose of metoprolol daily limited by hypotension

## 2016-12-06 NOTE — Telephone Encounter (Signed)
Mailed unread message to patient, thanks.  Added a copy of the labs for her to see.

## 2016-12-09 MED FILL — ATORVASTATIN 20 MG TABLET: 20 | 90 days supply | Qty: 90 | Fill #0

## 2016-12-20 MED FILL — DILTIAZEM ER 240 MG TABLET: 240 | 30 days supply | Qty: 30 | Fill #1

## 2016-12-26 MED FILL — VICTOZA 18 MG/3 ML INJECT P: 18 | 30 days supply | Qty: 9 | Fill #1

## 2017-01-12 ENCOUNTER — Ambulatory Visit (INDEPENDENT_AMBULATORY_CARE_PROVIDER_SITE_OTHER): Payer: 59 | Admitting: Internal Medicine

## 2017-01-12 ENCOUNTER — Ambulatory Visit (INDEPENDENT_AMBULATORY_CARE_PROVIDER_SITE_OTHER): Payer: 59

## 2017-01-12 ENCOUNTER — Encounter: Payer: Self-pay | Admitting: Internal Medicine

## 2017-01-12 VITALS — BP 118/82 | HR 84 | Ht 66.0 in | Wt 221.0 lb

## 2017-01-12 DIAGNOSIS — R002 Palpitations: Secondary | ICD-10-CM

## 2017-01-12 DIAGNOSIS — R0602 Shortness of breath: Secondary | ICD-10-CM

## 2017-01-12 DIAGNOSIS — R011 Cardiac murmur, unspecified: Secondary | ICD-10-CM

## 2017-01-12 NOTE — Progress Notes (Signed)
New Outpatient Visit Date: 01/12/2017  Referring Provider: Crecencio Mc, MD [address] [place], [place] [address]  Chief Complaint: Palpitations  HPI:  Ms. Closson is a 46 y.o. female who is being seen today for the evaluation of palpitations at the request of Dr. Derrel Nip. She has a history of hypertension, diabetes mellitus, asthma, and anxiety. She presented to the [facility] ED on 11/02/16 with palpitations. While at work [employer] [place]), Ms. Davlin noticed persistent fluttering in her chest with accompanying mild shortness of breath. She presented to the ED, where evaluation was unrevealing. However, she continued to have waxing and waning fluttering in the chest, that lasted for a total of ~12 hours. She has continued to have intermittent fluttering with mild shortness of breath about once a week. She denies chest pain, lightheadedness, orthopnea, and PND. She has occasional dependent edema, which predates onset of palpitations. She has significantly cut back on caffeine consumption but has not seen much difference in regard to her palpitations. She is concerned because her mother had an MI in her 22's and had been feeling palpitations before the event. Ms. Vandemark was previously on metoprolol but was recently switched to diltiazem by Dr. Derrel Nip for prophylaxis of migraine headaches.  Ms. Carmack denies a history of cardiac disease or previous cardiac testing.  --------------------------------------------------------------------------------------------------  Cardiovascular History & Procedures: Cardiovascular Problems:  Palpitations  Risk Factors:  Hypertension, diabetes, and family history  Cath/PCI:  None  CV Surgery:  None  EP Procedures and Devices:  None  Non-Invasive Evaluation(s):  None  Recent CV Pertinent Labs: Lab Results  Component Value Date   CHOL 160 08/17/2016   HDL 38.80 (L) 08/17/2016   LDLCALC 95 08/17/2016   LDLDIRECT 98.0 08/17/2016   TRIG 132.0 08/17/2016   CHOLHDL 4 08/17/2016   K 3.6 11/07/2016   K 3.6 06/14/2012   MG 1.9 11/07/2016   BUN 15 11/07/2016   BUN 17 06/14/2012   CREATININE 0.79 11/07/2016   CREATININE 0.83 06/14/2013    --------------------------------------------------------------------------------------------------  Past Medical History:  Diagnosis Date  . Allergy   . Anemia   . Anxiety    claustrophobic  . Asthma   . Diabetes mellitus 2011   did not start metforfin, losing weight  . Headache disorder   . Hypertension   . Shoulder impingement syndrome, right     Past Surgical History:  Procedure Laterality Date  . ABDOMINAL HYSTERECTOMY  2006   heavy menses, endometriosis, l oophrectomy  . BACK SURGERY    . BREAST SURGERY     right breast x 2 , benign  . HERNIA REPAIR  2003   left inguinal   . LEFT OOPHORECTOMY    . SHOULDER ARTHROSCOPY WITH SUBACROMIAL DECOMPRESSION AND OPEN ROTATOR C Right 07/14/2016   Procedure: RIGHT SHOULDER ARTHROSCOPY WITH SUBACROMIAL DECOMPRESSION, DISTAL CLAVICLE RESECTION AND MINI OPEN ROTATOR CUFF REPAIR, OPEN BICEP TENDODESIS;  Surgeon: Garald Balding, MD;  Location: [facility];  Service: Orthopedics;  Laterality: Right;  . SHOULDER CLOSED REDUCTION Right 09/08/2016   Procedure: RIGHT CLOSED MANIPULATION SHOULDER;  Surgeon: Garald Balding, MD;  Location: [facility];  Service: Orthopedics;  Laterality: Right;  . TUBAL LIGATION      No outpatient prescriptions have been marked as taking for the 01/12/17 encounter (Appointment) with Shequilla Goodgame, Harrell Gave, MD.    Allergies: Botox [onabotulinumtoxina]; Clindamycin/lincomycin; Kiwi extract; Maxalt [rizatriptan benzoate]; Nitrous oxide; Triptans; Aspirin; Flagyl [metronidazole]; Hydrocodone-acetaminophen; Latex; Mango flavor; Rizatriptan;  Tetracycline; Vicodin [hydrocodone-acetaminophen]; Penicillins; and Tetracyclines & related  Social History   Social  History  . Marital status: Divorced    Spouse name: N/A  . Number of children: N/A  . Years of education: N/A   Occupational History  . Not on file.   Social History Main Topics  . Smoking status: Never Smoker  . Smokeless tobacco: Never Used  . Alcohol use No  . Drug use: No  . Sexual activity: Not Currently    Partners: Male    Birth control/ protection: Surgical   Other Topics Concern  . Not on file   Social History Narrative  . No narrative on file    Family History  Problem Relation Age of Onset  . Diabetes Mother   . Heart disease Mother   . Hypertension Mother   . Hyperlipidemia Mother   . Cancer Father 6       Lung Cancer  . Mental illness Sister        bipolar, substance abuse,  clean 2 yrs  . Hypertension Sister   . Cancer Paternal Grandmother 44       breast cancer  . Diabetes Son     Review of Systems: A 12-system review of systems was performed and was negative except as noted in the HPI.  --------------------------------------------------------------------------------------------------  Physical Exam: BP 118/82 (BP Location: Left Arm, Patient Position: Sitting, Cuff Size: Normal)   Pulse 84   Ht 5\' 6"  (1.676 m)   Wt 221 lb (100.2 kg)   BMI 35.67 kg/m   General:  Obese woman, seated comfortably in the exam room. HEENT: No conjunctival pallor or scleral icterus. Moist mucous membranes. OP clear. Neck: Supple without lymphadenopathy, thyromegaly, JVD, or HJR. No carotid bruit. Lungs: Normal work of breathing. Clear to auscultation bilaterally without wheezes or crackles. Heart: Regular rate and rhythm with 1/6 systolic murmur at the RUSB. No rubs or gallops. Unable to appreciate PMI. Abd: Bowel sounds present. Soft, NT/ND without hepatosplenomegaly Ext: No lower extremity edema. Radial, PT, and DP pulses are 2+ bilaterally Skin: Warm and dry without rash. Neuro: CNIII-XII intact. Strength and fine-touch sensation intact in upper and lower  extremities bilaterally. Psych: Normal mood and affect.  EKG:  Normal sinus rhythm without abnormalities.  Lab Results  Component Value Date   WBC 7.8 11/02/2016   HGB 12.7 11/02/2016   HCT 39.1 11/02/2016   MCV 90.1 11/02/2016   PLT 183 11/02/2016    Lab Results  Component Value Date   NA 139 11/07/2016   K 3.6 11/07/2016   CL 104 11/07/2016   CO2 28 11/07/2016   BUN 15 11/07/2016   CREATININE 0.79 11/07/2016   GLUCOSE 115 (H) 11/07/2016   ALT 18 11/07/2016    Lab Results  Component Value Date   CHOL 160 08/17/2016   HDL 38.80 (L) 08/17/2016   LDLCALC 95 08/17/2016   LDLDIRECT 98.0 08/17/2016   TRIG 132.0 08/17/2016   CHOLHDL 4 08/17/2016     --------------------------------------------------------------------------------------------------  ASSESSMENT AND PLAN: Palpitations New over the last 2 months, with continued episodes about once a week. Most likely etiology would be a paroxysmal supraventricular arrhythmia. Recent labs in the ED were notable for K 3.6, Mg 1.9, and TSH 1.06. D-dimer was also negative. We have agreed to obtain a 14-day event monitor and echo for further evaluation.  Shortness of breath and heart murmur Mild dyspnea noted with palpitations. Ms. Holzman is otherwise without significant shortness of breath. Exam today is notable  for a subtle systolic murmur. We have agreed to obtain an echo. We will defer ischemia evaluation, as her symptoms are not typical for coronary insufficiency, unless significant abnormalities are noted on echo or event monitor.  Follow-up: Return to clinic in 4-6 weeks in [facility] office.  Nelva Bush, MD 01/12/2017 2:29 PM

## 2017-01-12 NOTE — Patient Instructions (Addendum)
Medication Instructions:  Your physician recommends that you continue on your current medications as directed. Please refer to the Current Medication list given to you today.   Labwork: none  Testing/Procedures: Your physician has recommended that you wear a 14 day event monitor. Event monitors are medical devices that record the heart's electrical activity. Doctors most often [place] these monitors to diagnose arrhythmias. Arrhythmias are problems with the speed or rhythm of the heartbeat. The monitor is a small, portable device. You can wear one while you do your normal daily activities. This is usually used to diagnose what is causing palpitations/syncope (passing out).  Your physician has requested that you have an echocardiogram at the [facility] office. . Echocardiography is a painless test that uses sound waves to create images of your heart. It provides your doctor with information about the size and shape of your heart and how well your heart's chambers and valves are working. This procedure takes approximately one hour. There are no restrictions for this procedure.    Follow-Up: Your physician recommends that you schedule a follow-up appointment in: 4-6 weeks with Dr. Saunders Revel in [place]   Any Other Special Instructions Will Be Listed Below (If Applicable).     If you need a refill on your cardiac medications before your next appointment, please call your pharmacy.  Echocardiogram An echocardiogram, or echocardiography, uses sound waves (ultrasound) to produce an image of your heart. The echocardiogram is simple, painless, obtained within a short period of time, and offers valuable information to your health care provider. The images from an echocardiogram can provide information such as:  Evidence of coronary artery disease (CAD).  Heart size.  Heart muscle function.  Heart valve function.  Aneurysm detection.  Evidence of a past heart attack.  Fluid buildup around the  heart.  Heart muscle thickening.  Assess heart valve function.  Tell a health care provider about:  Any allergies you have.  All medicines you are taking, including vitamins, herbs, eye drops, creams, and over-the-counter medicines.  Any problems you or family members have had with anesthetic medicines.  Any blood disorders you have.  Any surgeries you have had.  Any medical conditions you have.  Whether you are pregnant or may be pregnant. What happens before the procedure? No special preparation is needed. Eat and drink normally. What happens during the procedure?  In order to produce an image of your heart, gel will be applied to your chest and a wand-like tool (transducer) will be moved over your chest. The gel will help transmit the sound waves from the transducer. The sound waves will harmlessly bounce off your heart to allow the heart images to be captured in real-time motion. These images will then be recorded.  You may need an IV to receive a medicine that improves the quality of the pictures. What happens after the procedure? You may return to your normal schedule including diet, activities, and medicines, unless your health care provider tells you otherwise. This information is not intended to replace advice given to you by your health care provider. Make sure you discuss any questions you have with your health care provider. Document Released: 05/20/2000 Document Revised: 01/09/2016 Document Reviewed: 01/28/2013 Elsevier Interactive Patient Education  2017 [place].  Cardiac Event Monitoring A cardiac event monitor is a small recording device that is used to detect abnormal heart rhythms (arrhythmias). The monitor is used to record your heart rhythm when you have symptoms, such as:  Fast heartbeats (palpitations), such as  heart racing or fluttering.  Dizziness.  Fainting or light-headedness.  Unexplained weakness.  Some monitors are wired to electrodes  placed on your chest. Electrodes are flat, sticky disks that attach to your skin. Other monitors may be hand-held or worn on the wrist. The monitor can be worn for up to 30 days. If the monitor is attached to your chest, a technician will prepare your chest for the electrode placement and show you how to work the monitor. Take time to practice using the monitor before you leave the office. Make sure you understand how to send the information from the monitor to your health care provider. In some cases, you may need to use a landline telephone instead of a cell phone. What are the risks? Generally, this device is safe to use, but it possible that the skin under the electrodes will become irritated. How to use your cardiac event monitor  Wear your monitor at all times, except when you are in water: ? Do not let the monitor get wet. ? Take the monitor off when you bathe. Do not swim or use a hot tub with it on.  Keep your skin clean. Do not put body lotion or moisturizer on your chest.  Change the electrodes as told by your health care provider or any time they stop sticking to your skin. You may need to use medical tape to keep them on.  Try to put the electrodes in slightly different places on your chest to help prevent skin irritation. They must remain in the area under your left breast and in the upper right section of your chest.  Make sure the monitor is safely clipped to your clothing or in a location close to your body that your health care provider recommends.  Press the button to record as soon as you feel heart-related symptoms, such as: ? Dizziness. ? Weakness. ? Light-headedness. ? Palpitations. ? Thumping or pounding in your chest. ? Shortness of breath. ? Unexplained weakness.  Keep a diary of your activities, such as walking, doing chores, and taking medicine. It is very important to note what you were doing when you pushed the button to record your symptoms. This will help  your health care provider determine what might be contributing to your symptoms.  Send the recorded information as recommended by your health care provider. It may take some time for your health care provider to process the results.  Change the batteries as told by your health care provider.  Keep electronic devices away from your monitor. This includes: ? Tablets. ? MP3 players. ? Cell phones.  While wearing your monitor you should avoid: ? Electric blankets. ? Armed forces operational officer. ? Electric toothbrushes. ? Microwave ovens. ? Magnets. ? Metal detectors. Get help right away if:  You have chest pain.  You have extreme difficulty breathing or shortness of breath.  You develop a very fast heartbeat that persists.  You develop dizziness that does not go away.  You faint or constantly feel like you are about to faint. Summary  A cardiac event monitor is a small recording device that is used to help detect abnormal heart rhythms (arrhythmias).  The monitor is used to record your heart rhythm when you have heart-related symptoms.  Make sure you understand how to send the information from the monitor to your health care provider.  It is important to press the button on the monitor when you have any heart-related symptoms.  Keep a diary of your activities, such  as walking, doing chores, and taking medicine. It is very important to note what you were doing when you pushed the button to record your symptoms. This will help your health care provider learn what might be causing your symptoms. This information is not intended to replace advice given to you by your health care provider. Make sure you discuss any questions you have with your health care provider. Document Released: 03/01/2008 Document Revised: 05/07/2016 Document Reviewed: 05/07/2016 Elsevier Interactive Patient Education  2017 [employer].

## 2017-01-19 ENCOUNTER — Other Ambulatory Visit: Payer: Self-pay

## 2017-01-19 ENCOUNTER — Ambulatory Visit (HOSPITAL_COMMUNITY): Payer: 59 | Attending: Cardiology

## 2017-01-19 DIAGNOSIS — R011 Cardiac murmur, unspecified: Secondary | ICD-10-CM | POA: Diagnosis not present

## 2017-01-19 DIAGNOSIS — R0602 Shortness of breath: Secondary | ICD-10-CM | POA: Insufficient documentation

## 2017-01-19 DIAGNOSIS — I071 Rheumatic tricuspid insufficiency: Secondary | ICD-10-CM | POA: Insufficient documentation

## 2017-01-19 MED FILL — DILTIAZEM ER 240 MG TABLET: 240 | 60 days supply | Qty: 60 | Fill #2

## 2017-01-22 DIAGNOSIS — R002 Palpitations: Secondary | ICD-10-CM | POA: Diagnosis not present

## 2017-01-27 MED FILL — VICTOZA 18 MG/3 ML INJECT P: 18 | 30 days supply | Qty: 9 | Fill #2

## 2017-01-28 DIAGNOSIS — R002 Palpitations: Secondary | ICD-10-CM | POA: Diagnosis not present

## 2017-01-31 ENCOUNTER — Other Ambulatory Visit: Payer: Self-pay | Admitting: Internal Medicine

## 2017-01-31 DIAGNOSIS — Z1231 Encounter for screening mammogram for malignant neoplasm of breast: Secondary | ICD-10-CM

## 2017-02-07 ENCOUNTER — Other Ambulatory Visit: Payer: Self-pay | Admitting: *Deleted

## 2017-02-07 MED ORDER — METOPROLOL TARTRATE 25 MG PO TABS: 12.5000 mg | ORAL_TABLET | Freq: Every day | ORAL | 3 refills | Status: DC

## 2017-02-07 MED FILL — METOPROLOL TARTRATE 25 MG T: 25 | 90 days supply | Qty: 45 | Fill #0

## 2017-02-09 ENCOUNTER — Encounter (INDEPENDENT_AMBULATORY_CARE_PROVIDER_SITE_OTHER): Payer: Self-pay | Admitting: *Deleted

## 2017-02-09 ENCOUNTER — Encounter: Payer: Self-pay | Admitting: *Deleted

## 2017-02-09 DIAGNOSIS — Z029 Encounter for administrative examinations, unspecified: Secondary | ICD-10-CM

## 2017-02-14 ENCOUNTER — Ambulatory Visit
Admission: RE | Admit: 2017-02-14 | Discharge: 2017-02-14 | Disposition: A | Discharge status: Home / Self Care | Payer: 59 | Source: Ambulatory Visit | Attending: Internal Medicine | Admitting: Internal Medicine

## 2017-02-14 ENCOUNTER — Other Ambulatory Visit: Payer: Self-pay | Admitting: Family

## 2017-02-14 DIAGNOSIS — Z1231 Encounter for screening mammogram for malignant neoplasm of breast: Secondary | ICD-10-CM

## 2017-02-14 MED FILL — UNIFINE PENTIPS 31GX3/16: 31G X 5 MM | 90 days supply | Qty: 100 | Fill #0

## 2017-02-14 MED FILL — UNIFINE PENTIPS 31GX3/16": 31G X 5 MM | 90 days supply | Qty: 100 | Fill #0

## 2017-02-24 ENCOUNTER — Telehealth: Payer: Self-pay | Admitting: Internal Medicine

## 2017-02-24 ENCOUNTER — Telehealth: Payer: Self-pay | Admitting: *Deleted

## 2017-02-24 ENCOUNTER — Encounter: Payer: Self-pay | Admitting: Internal Medicine

## 2017-02-24 ENCOUNTER — Emergency Department (HOSPITAL_COMMUNITY): Payer: 59

## 2017-02-24 ENCOUNTER — Ambulatory Visit: Payer: 59 | Admitting: Obstetrics & Gynecology

## 2017-02-24 ENCOUNTER — Encounter (HOSPITAL_COMMUNITY): Payer: Self-pay | Admitting: Emergency Medicine

## 2017-02-24 DIAGNOSIS — I10 Essential (primary) hypertension: Secondary | ICD-10-CM | POA: Insufficient documentation

## 2017-02-24 DIAGNOSIS — J45909 Unspecified asthma, uncomplicated: Secondary | ICD-10-CM | POA: Diagnosis not present

## 2017-02-24 DIAGNOSIS — E1169 Type 2 diabetes mellitus with other specified complication: Secondary | ICD-10-CM | POA: Diagnosis not present

## 2017-02-24 DIAGNOSIS — R0989 Other specified symptoms and signs involving the circulatory and respiratory systems: Secondary | ICD-10-CM | POA: Diagnosis not present

## 2017-02-24 DIAGNOSIS — R0789 Other chest pain: Secondary | ICD-10-CM | POA: Diagnosis present

## 2017-02-24 DIAGNOSIS — Z79899 Other long term (current) drug therapy: Secondary | ICD-10-CM | POA: Insufficient documentation

## 2017-02-24 DIAGNOSIS — T7840XA Allergy, unspecified, initial encounter: Secondary | ICD-10-CM | POA: Diagnosis not present

## 2017-02-24 DIAGNOSIS — Z91040 Latex allergy status: Secondary | ICD-10-CM | POA: Diagnosis not present

## 2017-02-24 DIAGNOSIS — Z7982 Long term (current) use of aspirin: Secondary | ICD-10-CM | POA: Insufficient documentation

## 2017-02-24 DIAGNOSIS — R918 Other nonspecific abnormal finding of lung field: Secondary | ICD-10-CM | POA: Diagnosis not present

## 2017-02-24 DIAGNOSIS — Z7983 Long term (current) use of bisphosphonates: Secondary | ICD-10-CM | POA: Diagnosis not present

## 2017-02-24 DIAGNOSIS — D649 Anemia, unspecified: Secondary | ICD-10-CM | POA: Insufficient documentation

## 2017-02-24 DIAGNOSIS — R9431 Abnormal electrocardiogram [ECG] [EKG]: Secondary | ICD-10-CM | POA: Diagnosis not present

## 2017-02-24 LAB — CBC
HCT: 40.3 % (ref 36.0–46.0)
Hemoglobin: 13.3 g/dL (ref 12.0–15.0)
MCH: 29 pg (ref 26.0–34.0)
MCHC: 33 g/dL (ref 30.0–36.0)
MCV: 87.8 fL (ref 78.0–100.0)
Platelets: 224 10*3/uL (ref 150–400)
RBC: 4.59 MIL/uL (ref 3.87–5.11)
RDW: 13.2 % (ref 11.5–15.5)
WBC: 10.1 10*3/uL (ref 4.0–10.5)

## 2017-02-24 LAB — BASIC METABOLIC PANEL
Anion gap: 11 (ref 5–15)
BUN: 9 mg/dL (ref 6–20)
CO2: 24 mmol/L (ref 22–32)
Calcium: 9.5 mg/dL (ref 8.9–10.3)
Chloride: 101 mmol/L (ref 101–111)
Creatinine, Ser: 0.81 mg/dL (ref 0.44–1.00)
GFR calc Af Amer: >60 mL/min (ref >60)
GFR calc non Af Amer: >60 mL/min (ref >60)
Glucose, Bld: 115 mg/dL — ABNORMAL HIGH (ref 65–99)
Potassium: 3.1 mmol/L — ABNORMAL LOW (ref 3.5–5.1)
Sodium: 136 mmol/L (ref 135–145)

## 2017-02-24 LAB — I-STAT TROPONIN, ED: Troponin i, poc: 0 ng/mL (ref 0.00–0.08)

## 2017-02-24 NOTE — Telephone Encounter (Signed)
[facility] Patient Name: [name] DOB: [birth year] Initial Comment Caller needs patient triage. Chest pain and tightness and tingling going down both of her arms and shortness of breath. Nurse Assessment Nurse: Dimas Chyle, RN, Dellis Filbert Date/Time Eilene Ghazi Time): 02/24/2017 3:22:31 PM Confirm and document reason for call. If symptomatic, describe symptoms. ---Caller needs patient triage. Chest pain and tightness and tingling going down both of her arms and shortness of breath. Had similar symptoms last year after taking flu vaccine. Got vaccine yesterday. Symptoms started yesterday. Does the patient have any new or worsening symptoms? ---Yes Will a triage be completed? ---Yes Related visit to physician within the last 2 weeks? ---No Does the PT have any chronic conditions? (i.e. diabetes, asthma, etc.) ---Unknown Is the patient pregnant or possibly pregnant? (Ask all females between the ages of 53-55) ---No Is this a behavioral health or substance abuse call? ---No Guidelines Guideline Title Affirmed Question Affirmed Notes Anaphylaxis [1] Took antihistamine (e.g., Benadryl) by mouth AND [2] no symptoms now Final Disposition User Go to ED Now Dimas Chyle, RN, Dellis Filbert Comments Symptoms sound like anaphylactic reaction to flu vaccine. Referrals [facility] Caller Disagree/Comply Comply Caller Understands Yes PreDisposition Call Doctor

## 2017-02-24 NOTE — Telephone Encounter (Signed)
I agree with need for evaluation.

## 2017-02-24 NOTE — Telephone Encounter (Signed)
Pt took her Flu vaccine on 02/24/17 . Pt is currently having chest pain and tightness along with SOB. She also reports tingling down her limbs (arms) .Pt had the same symptoms after receiving vaccine last year.  Pt transferred to Nurse Line   Pt contact [phone]

## 2017-02-24 NOTE — Telephone Encounter (Signed)
Called patient she was very SOB on phone and vague, advised patient she should go immediately to the ER  patient stated she was talking with team health when I called advised patient that I knew her and could tell she is SOB on phone and I feel she needs IV medications for this reaction she said she would go to er.

## 2017-02-24 NOTE — ED Triage Notes (Signed)
Reports having flu shot yesterday.  Had a reaction last year but was told to take benadryl prior to vaccine and try again.  Having chest tightness, a tickle in the throat, eyes are red and watering, also c/o sob.  Also reports left arm felt numb after shot but is better since taking benadryl.  Had 100mg  since 4 pm.

## 2017-02-24 NOTE — Telephone Encounter (Signed)
fyi

## 2017-02-25 ENCOUNTER — Emergency Department (HOSPITAL_COMMUNITY)
Admission: EM | Admit: 2017-02-25 | Discharge: 2017-02-25 | Disposition: A | Discharge status: Home / Self Care | Payer: 59 | Attending: Emergency Medicine | Admitting: Emergency Medicine

## 2017-02-25 DIAGNOSIS — T7840XA Allergy, unspecified, initial encounter: Secondary | ICD-10-CM

## 2017-02-25 MED ORDER — PREDNISONE 20 MG PO TABS: ORAL_TABLET | ORAL | 0 refills | Status: DC

## 2017-02-25 MED ORDER — POTASSIUM CHLORIDE CRYS ER 20 MEQ PO TBCR
40.0000 meq | EXTENDED_RELEASE_TABLET | Freq: Once | ORAL | Status: AC
  Administered 2017-02-25: 40 meq via ORAL
  Filled 2017-02-25: qty 2

## 2017-02-25 MED ORDER — PREDNISONE 20 MG PO TABS
60.0000 mg | ORAL_TABLET | Freq: Once | ORAL | Status: AC
  Administered 2017-02-25: 60 mg via ORAL
  Filled 2017-02-25: qty 3

## 2017-02-25 MED ORDER — FAMOTIDINE 20 MG PO TABS
40.0000 mg | ORAL_TABLET | Freq: Once | ORAL | Status: AC
  Administered 2017-02-25: 40 mg via ORAL
  Filled 2017-02-25: qty 2

## 2017-02-25 NOTE — Discharge Instructions (Signed)
Take the prescribed medication as directed.  Would continue benadryl as long as you arent too drowsy.  Can also take pepcid as well to help with symptoms. Follow-up with your primary care doctor if any ongoing issues. Return to the ED for new or worsening symptoms.

## 2017-02-25 NOTE — ED Provider Notes (Signed)
[facility] DEPT Provider Note   CSN: [id number] Arrival date & time: 02/24/17  2020     History   Chief Complaint Chief Complaint  Patient presents with  . Allergic Reaction    HPI BRYANAH SIDELL is a 46 y.o. female.  The history is provided by the patient and medical records.  Allergic Reaction     46 y.o. F with hx of anxiety, asthma, allergies, anemia, headaches, HTN, DM, presenting to the ED for possible allergic reaction.  Patient states she got a flu vaccine yesterday and feels like she is having a reaction to it.  States same thing happened last year but resolved with benadryl.  She was told by her allergist and PCP that it was likely a reaction to one of the additives.  States she was told it was probably safe to get a flu vaccine this year, they recommended she premedicate herself with 50 mg Benadryl which she did. 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 States by Friday morning she had scratchiness in her throat, tingling sensation in the left arm, and some chest tightness. States the tingling has resolved with additional Benadryl but she continues to have a scratchy sensation in her throat. She also reports her eyes have been watering today. No blurred vision. No shortness of breath. No cough or fever. Patient reports she has a multitude of allergies, some of which are atypical. She has no prior cardiac history. She is not a smoker.   Patient Active Problem List   Diagnosis Date Noted  . Paroxysmal supraventricular tachycardia ([place]) 11/26/2016  . History of palpitations 11/08/2016  . Nontraumatic incomplete tear of right rotator cuff 07/14/2016  . AC (acromioclavicular) arthritis 07/14/2016  . Impingement syndrome of right shoulder 07/14/2016  . Fibrocystic breast changes, right 05/21/2016  . Menopausal symptoms 10/30/2015  . Insomnia 10/16/2015  . Snoring 07/26/2015  . Migraine without aura and without status  migrainosus, not intractable 02/24/2015  . Muscle spasm 02/24/2015  . Facial paresthesia 02/24/2015  . Concussion with loss of consciousness 07/21/2014  . Hyperlipidemia associated with type 2 diabetes mellitus ([place]) 06/13/2014  . Pain in joint, shoulder region 08/11/2013  . Vitamin D deficiency 05/07/2013  . Chronic migraine 11/20/2012  . S/P Total Abdominal Hysterectomy and Left Salpingo-oophorectomy 10/20/2011  . Headache disorder   . Uncontrolled type 2 diabetes mellitus ([place]) 07/20/2011  . Obesity (BMI 30-39.9) 07/20/2011  . Hypertension goal BP (blood pressure) < 130/80 07/20/2011    Past Surgical History:  Procedure Laterality Date  . ABDOMINAL HYSTERECTOMY  2006   heavy menses, endometriosis, l oophrectomy  . BACK SURGERY    . BREAST BIOPSY Right 2018   benign  . BREAST EXCISIONAL BIOPSY Right 2003   benign  . BREAST EXCISIONAL BIOPSY Right 1999   benign  . BREAST SURGERY     right breast x 2 , benign  . HERNIA REPAIR  2003   left inguinal   . LEFT OOPHORECTOMY    . SHOULDER ARTHROSCOPY WITH SUBACROMIAL DECOMPRESSION AND OPEN ROTATOR C Right 07/14/2016   Procedure: RIGHT SHOULDER ARTHROSCOPY WITH SUBACROMIAL DECOMPRESSION, DISTAL CLAVICLE RESECTION AND MINI OPEN ROTATOR CUFF REPAIR, OPEN BICEP TENDODESIS;  Surgeon: Garald Balding, MD;  Location: [facility];  Service: Orthopedics;  Laterality: Right;  . SHOULDER CLOSED REDUCTION Right 09/08/2016   Procedure: RIGHT CLOSED MANIPULATION SHOULDER;  Surgeon: Garald Balding, MD;  Location: [facility];  Service: Orthopedics;  Laterality: Right;  . TUBAL LIGATION      OB History    Gravida Para Term Preterm AB Living   3 2 2   1 2    SAB TAB Ectopic Multiple Live Births       1   2       Home Medications    Prior to Admission medications   Medication Sig Start Date End Date Taking? Authorizing Provider  albuterol (PROVENTIL HFA;VENTOLIN HFA) 108 (90 BASE) MCG/ACT inhaler Inhale 2 puffs  into the lungs every 6 (six) hours as needed for wheezing or shortness of breath. One puff as needed 01/19/15   Crecencio Mc, MD  aspirin EC 81 MG tablet Take 81 mg by mouth daily.    [provider]  atorvastatin (LIPITOR) 20 MG tablet Take 1 tablet (20 mg total) by mouth daily. 05/23/16   Crecencio Mc, MD  diazepam (VALIUM) 10 MG tablet Take 1 tablet (10 mg total) by mouth every 12 (twelve) hours as needed (muscle spasm). 11/24/16   Crecencio Mc, MD  diltiazem (CARDIZEM LA) 240 MG 24 hr tablet Take 1 tablet (240 mg total) by mouth daily. 11/07/16   Crecencio Mc, MD  EPINEPHrine 0.3 mg/0.3 mL IJ SOAJ injection  07/22/16   [provider]  ketorolac (TORADOL) 10 MG tablet Take 1 tablet (10 mg total) by mouth every 6 (six) hours as needed. 02/16/16   Osborne Oman, MD  liraglutide 18 MG/3ML SOPN 1.8 mg once daily 11/23/16   Crecencio Mc, MD  losartan-hydrochlorothiazide (HYZAAR) 50-12.5 MG tablet Take 1 tablet by mouth daily. 08/17/16   Crecencio Mc, MD  meclizine (ANTIVERT) 25 MG tablet Take 25 mg by mouth 3 (three) times daily as needed for dizziness.    [provider]  methocarbamol (ROBAXIN) 500 MG tablet Take 1 tablet (500 mg total) by mouth every 8 (eight) hours as needed for muscle spasms. 08/24/16   Cherylann Ratel, PA-C  metoprolol tartrate (LOPRESSOR) 25 MG tablet Take 0.5 tablets (12.5 mg total) by mouth daily. 02/07/17 05/08/17  End, Harrell Gave, MD  ondansetron (ZOFRAN) 4 MG tablet Take 1 tablet (4 mg total) by mouth every 8 (eight) hours as needed for nausea or vomiting. 10/04/15   Jolina Symonds Roca, MD  oxyCODONE-acetaminophen (ROXICET) 5-325 MG tablet Take 1-2 tablets by mouth every 4 (four) hours as needed for severe pain. 09/08/16   Cherylann Ratel, PA-C  UNIFINE PENTIPS 31G X 5 MM MISC USE AS DIRECTED WITH VICTOZA 02/14/17   Burnard Hawthorne, FNP    Family History Family History  Problem Relation Age of Onset  . Diabetes Mother   . Heart  disease Mother 42  . Hypertension Mother   . Hyperlipidemia Mother   . Heart failure Mother   . Cancer Father 28       Lung Cancer  . Heart disease Father   . Mental illness Sister        bipolar, substance abuse,  clean 2 yrs  . Hypertension Sister   . Cancer Paternal Grandmother 42       breast cancer  . Breast cancer Paternal Grandmother   . Diabetes Son     Social History Social History  Substance Use Topics  . Smoking status: Never Smoker  . Smokeless tobacco: Never Used  . Alcohol use No     Allergies   Botox [onabotulinumtoxina]; Clindamycin/lincomycin; Kiwi extract; Maxalt [rizatriptan benzoate]; Nitrous oxide;  Triptans; Aspirin; Flagyl [metronidazole]; Hydrocodone-acetaminophen; Latex; Mango flavor; Rizatriptan; Tetracycline; Vicodin [hydrocodone-acetaminophen]; Penicillins; and Tetracyclines & related   Review of Systems Review of Systems  HENT:       Scratchy throat  Eyes:       Watery eyes  Respiratory: Positive for chest tightness.   All other systems reviewed and are negative.    Physical Exam Updated Vital Signs BP (!) 140/98 (BP Location: Left Arm)   Pulse 66   Temp 97.8 F (36.6 C) (Oral)   Resp 18   Ht 5\' 6"  (1.676 m)   Wt 98 kg (216 lb)   SpO2 100%   BMI 34.86 kg/m   Physical Exam  Constitutional: She is oriented to person, place, and time. She appears well-developed and well-nourished.  HENT:  Head: Normocephalic and atraumatic.  Mouth/Throat: Oropharynx is clear and moist.  No lip or tongue swelling, airway patent without edema, no oral lesions, handling secretions well, normal phonation without stridor  Eyes: Pupils are equal, round, and reactive to light. Conjunctivae and EOM are normal.  Normal without conjunctival injection or hemorrhage, no tearing or drainage at this time  Neck: Normal range of motion.  Cardiovascular: Normal rate, regular rhythm and normal heart sounds.   Pulmonary/Chest: Effort normal and breath sounds  normal. No respiratory distress. She has no wheezes.  Abdominal: Soft. Bowel sounds are normal. There is no tenderness. There is no rebound.  Musculoskeletal: Normal range of motion.  Neurological: She is alert and oriented to person, place, and time.  Skin: Skin is warm and dry.  Site of vaccine injection to left upper arm without noted edema, erythema, or rash  Psychiatric: She has a normal mood and affect.  Nursing note and vitals reviewed.    ED Treatments / Results  Labs (all labs ordered are listed, but only abnormal results are displayed) Labs Reviewed  BASIC METABOLIC PANEL - Abnormal; Notable for the following:       Result Value   Potassium 3.1 (*)    Glucose, Bld 115 (*)    All other components within normal limits  CBC  I-STAT TROPONIN, ED    EKG  EKG Interpretation None       Radiology Dg Chest 2 View  Result Date: 02/24/2017 CLINICAL DATA:  Mid chest tightness EXAM: CHEST  2 VIEW COMPARISON:  Chest x-ray dated 11/02/2016. FINDINGS: Cardiomediastinal silhouette is normal in size and configuration. Lungs are clear. Lung volumes are normal. No evidence of pneumonia. No pleural effusion. No pneumothorax seen. Osseous and soft tissue structures about the chest are unremarkable. IMPRESSION: No active cardiopulmonary disease. No evidence of pneumonia or pulmonary edema. Electronically Signed   By: Franki Cabot M.D.   On: 02/24/2017 20:49    Procedures Procedures (including critical care time)  Medications Ordered in ED Medications  predniSONE (DELTASONE) tablet 60 mg (60 mg Oral Given 02/25/17 0247)  potassium chloride SA (K-DUR,KLOR-CON) CR tablet 40 mEq (40 mEq Oral Given 02/25/17 0249)  famotidine (PEPCID) tablet 40 mg (40 mg Oral Given 02/25/17 0246)     Initial Impression / Assessment and Plan / ED Course  I have reviewed the triage vital signs and the nursing notes.  Pertinent labs & imaging results that were available during my care of the patient were  reviewed by me and considered in my medical decision making (see chart for details).  46 year old female here with likely allergic reaction to flu vaccine. Reports similar episode last year with her vaccine. She premedicated with  50 mg Benadryl, but after vaccine developed some tingling sensations in the left arm which have resolved, scratchy throat, watery eyes. She is continue Benadryl at home without relief. On exam she has no lip or tongue swelling suggestive of anaphylaxis. She is handling her secretions well. Normal phonation without stridor. Lungs sounds are clear. No evidence of localized reaction to left upper arm at site of injection. EKG nonischemic. Screen labs overall reassuring, potassium is mildly low and will be replaced here. Chest x-ray is clear. Patient reports she has been getting flu vaccines for the past 20 years but developed reaction over the past 2 years. Question if there is reaction to specific additive to the vaccine.  Patient given prednisone here, continue taper at home.  She has epi-pen to use if needed.  Will have her follow-up closely with her primary care doctor.  Discussed plan with patient, she acknowledged understanding and agreed with plan of care.  Return precautions given for new or worsening symptoms.  Final Clinical Impressions(s) / ED Diagnoses   Final diagnoses:  Allergic reaction, initial encounter    New Prescriptions New Prescriptions   PREDNISONE (DELTASONE) 20 MG TABLET    Take 40 mg by mouth daily for 3 days, then 20mg  by mouth daily for 3 days, then 10mg  daily for 3 days     Larene Pickett, PA-C 02/25/17 0331    Veryl Speak, MD 02/25/17 [phone]

## 2017-02-27 ENCOUNTER — Ambulatory Visit (INDEPENDENT_AMBULATORY_CARE_PROVIDER_SITE_OTHER): Payer: 59 | Admitting: Internal Medicine

## 2017-02-27 ENCOUNTER — Ambulatory Visit (INDEPENDENT_AMBULATORY_CARE_PROVIDER_SITE_OTHER): Payer: 59 | Admitting: Obstetrics & Gynecology

## 2017-02-27 ENCOUNTER — Encounter: Payer: Self-pay | Admitting: Obstetrics & Gynecology

## 2017-02-27 ENCOUNTER — Encounter: Payer: Self-pay | Admitting: Internal Medicine

## 2017-02-27 VITALS — BP 122/62 | HR 78 | Wt 222.0 lb

## 2017-02-27 VITALS — BP 124/72 | HR 76 | Ht 66.0 in | Wt 222.0 lb

## 2017-02-27 DIAGNOSIS — Z01419 Encounter for gynecological examination (general) (routine) without abnormal findings: Secondary | ICD-10-CM | POA: Diagnosis not present

## 2017-02-27 DIAGNOSIS — I10 Essential (primary) hypertension: Secondary | ICD-10-CM | POA: Diagnosis not present

## 2017-02-27 DIAGNOSIS — I491 Atrial premature depolarization: Secondary | ICD-10-CM | POA: Insufficient documentation

## 2017-02-27 DIAGNOSIS — R002 Palpitations: Secondary | ICD-10-CM | POA: Diagnosis not present

## 2017-02-27 DIAGNOSIS — I493 Ventricular premature depolarization: Secondary | ICD-10-CM | POA: Insufficient documentation

## 2017-02-27 DIAGNOSIS — R079 Chest pain, unspecified: Secondary | ICD-10-CM | POA: Diagnosis not present

## 2017-02-27 DIAGNOSIS — R0789 Other chest pain: Secondary | ICD-10-CM | POA: Insufficient documentation

## 2017-02-27 DIAGNOSIS — Z113 Encounter for screening for infections with a predominantly sexual mode of transmission: Secondary | ICD-10-CM | POA: Diagnosis not present

## 2017-02-27 DIAGNOSIS — I471 Supraventricular tachycardia: Secondary | ICD-10-CM | POA: Diagnosis not present

## 2017-02-27 MED FILL — LOSARTAN-HCTZ 50-12.5 MG TA: 50-12.5 | 90 days supply | Qty: 90 | Fill #1

## 2017-02-27 MED FILL — predniSONE 20 MG TABS: 20 | 9 days supply | Qty: 12 | Fill #0

## 2017-02-27 NOTE — Progress Notes (Signed)
Follow-up Outpatient Visit Date: 02/27/2017  Primary Care Provider: Crecencio Mc, MD [place] [address]  Chief Complaint: Chest pain  HPI:  [name] is a 46 y.o. year-old female with history of HTN, DM, asthma, anxiety, and multiple allergies, who presents for follow-up of chest pain and palpitations. I last saw her in early August follBowing an episode of palpitations and lightheadedness. Subsequent event monitor showed rare PACs, PVCs, and single episode of SVT/AT. We started low dose metoprolol with resolution of her symptoms. She has been tolerating metoprolol well. BP at home has been 110-116/60-70.  Last week, [name] began having bilateral chest tightness, shortness of breath, and coryza after receiving the flu vaccine. She presented to the ED and was diagnosed with an allergic reaction to the influenza immunization, as she had experienced similar but less severe symptoms last year.  --------------------------------------------------------------------------------------------------  Cardiovascular History & Procedures: Cardiovascular Problems:  Palpitations  Risk Factors:  Hypertension, diabetes, obesity, and family history  Cath/PCI:  None  CV Surgery:  None  EP Procedures and Devices:  14-day event monitor (01/12/17): Predominantly sinus rhythm with rare PACs and PVCs. Brief atrial run lasting 4 beats.  Non-Invasive Evaluation(s):  TTE (01/19/17): Normal LV size with moderate LVH. LVEF 60-65% with grade 1 diastolic dysfunction. Normal RV size and function. Trivial pericardial effusion.  Recent CV Pertinent Labs: Lab Results  Component Value Date   CHOL 160 08/17/2016   HDL 38.80 (L) 08/17/2016   LDLCALC 95 08/17/2016   LDLDIRECT 98.0 08/17/2016   TRIG 132.0 08/17/2016   CHOLHDL 4 08/17/2016   K 3.1 (L) 02/24/2017   K 3.6 06/14/2012   MG 1.9 11/07/2016   BUN 9 02/24/2017   BUN 17 06/14/2012   CREATININE 0.81  02/24/2017   CREATININE 0.83 06/14/2013    Past medical and surgical history were reviewed and updated in EPIC.  Current Meds  Medication Sig  . albuterol (PROVENTIL HFA;VENTOLIN HFA) 108 (90 BASE) MCG/ACT inhaler Inhale 2 puffs into the lungs every 6 (six) hours as needed for wheezing or shortness of breath. One puff as needed  . aspirin EC 81 MG tablet Take 81 mg by mouth daily.  [name] atorvastatin (LIPITOR) 20 MG tablet Take 1 tablet (20 mg total) by mouth daily.  . diazepam (VALIUM) 10 MG tablet Take 1 tablet (10 mg total) by mouth every 12 (twelve) hours as needed (muscle spasm).  [name] diltiazem (CARDIZEM LA) 240 MG 24 hr tablet Take 1 tablet (240 mg total) by mouth daily.  [name] EPINEPHrine 0.3 mg/0.3 mL IJ SOAJ injection Inject 0.3 mg into the muscle as needed (for allergic reaction).   [name] ketorolac (TORADOL) 10 MG tablet Take 1 tablet (10 mg total) by mouth every 6 (six) hours as needed.  . liraglutide 18 MG/3ML SOPN 1.8 mg once daily  . losartan-hydrochlorothiazide (HYZAAR) 50-12.5 MG tablet Take 1 tablet by mouth daily.  . meclizine (ANTIVERT) 25 MG tablet Take 25 mg by mouth 3 (three) times daily as needed for dizziness.  . methocarbamol (ROBAXIN) 500 MG tablet Take 1 tablet (500 mg total) by mouth every 8 (eight) hours as needed for muscle spasms.  . metoprolol tartrate (LOPRESSOR) 25 MG tablet Take 0.5 tablets (12.5 mg total) by mouth daily.  . ondansetron (ZOFRAN) 4 MG tablet Take 1 tablet (4 mg total) by mouth every 8 (eight) hours as needed for nausea or vomiting.  [name] oxyCODONE-acetaminophen (ROXICET) 5-325 MG tablet Take 1-2 tablets by mouth every  4 (four) hours as needed for severe pain.  . predniSONE (DELTASONE) 20 MG tablet Take 40 mg by mouth daily for 3 days, then 20mg  by mouth daily for 3 days, then 10mg  daily for 3 days  . UNIFINE PENTIPS 31G X 5 MM MISC USE AS DIRECTED WITH VICTOZA    Allergies: Botox [onabotulinumtoxina]; Clindamycin/lincomycin; Kiwi extract; Maxalt  [rizatriptan benzoate]; Nitrous oxide; Triptans; Aspirin; Flagyl [metronidazole]; Hydrocodone-acetaminophen; Latex; Mango flavor; Rizatriptan; Tetracycline; Vicodin [hydrocodone-acetaminophen]; Flu virus vaccine; Penicillins; and Tetracyclines & related  Social History   Social History  . Marital status: Divorced    Spouse name: N/A  . Number of children: N/A  . Years of education: N/A   Occupational History  . Not on file.   Social History Main Topics  . Smoking status: Never Smoker  . Smokeless tobacco: Never Used  . Alcohol use No  . Drug use: No  . Sexual activity: Not Currently    Partners: Male    Birth control/ protection: Surgical   Other Topics Concern  . Not on file   Social History Narrative  . No narrative on file    Family History  Problem Relation Age of Onset  . Diabetes Mother   . Heart disease Mother 57  . Hypertension Mother   . Hyperlipidemia Mother   . Heart failure Mother   . Cancer Father 29       Lung Cancer  . Heart disease Father   . Mental illness Sister        bipolar, substance abuse,  clean 2 yrs  . Hypertension Sister   . Cancer Paternal Grandmother 58       breast cancer  . Breast cancer Paternal Grandmother   . Diabetes Son     Review of Systems: A 12-system review of systems was performed and was negative except as noted in the HPI.  --------------------------------------------------------------------------------------------------  Physical Exam: BP 124/72   Pulse 76   Ht 5\' 6"  (1.676 m)   Wt 222 lb (100.7 kg)   BMI 35.83 kg/m   General:  Obese woman, seated comfortably in the exam room. HEENT: No conjunctival pallor or scleral icterus. Conjunctiva are mildly injected. Moist mucous membranes.  OP clear. Neck: Supple without lymphadenopathy, thyromegaly, JVD, or HJR. No carotid bruit. Lungs: Normal work of breathing. Clear to auscultation bilaterally without wheezes or crackles. Heart: Regular rate and rhythm without  murmurs, rubs, or gallops. Unable to assess PMI due to body habitus. Abd: Bowel sounds present. Soft, NT/ND. Unable to assess HSM due to body habitus. Ext: No lower extremity edema. Radial, PT, and DP pulses are 2+ bilaterally. Skin: Warm and dry without rash.  EKG (02/24/17): NSR with non-specific T-wave abnormality - personally reviewed.  Lab Results  Component Value Date   WBC 10.1 02/24/2017   HGB 13.3 02/24/2017   HCT 40.3 02/24/2017   MCV 87.8 02/24/2017   PLT 224 02/24/2017    Lab Results  Component Value Date   NA 136 02/24/2017   K 3.1 (L) 02/24/2017   CL 101 02/24/2017   CO2 24 02/24/2017   BUN 9 02/24/2017   CREATININE 0.81 02/24/2017   GLUCOSE 115 (H) 02/24/2017   ALT 18 11/07/2016    Lab Results  Component Value Date   CHOL 160 08/17/2016   HDL 38.80 (L) 08/17/2016   LDLCALC 95 08/17/2016   LDLDIRECT 98.0 08/17/2016   TRIG 132.0 08/17/2016   CHOLHDL 4 08/17/2016    --------------------------------------------------------------------------------------------------  ASSESSMENT AND PLAN: Chest  pain Symptoms atypical, occurring in the setting of allergic reaction to flu vaccine. However, given multiple cardiac risk factors (HTN, DM, FH, and obesity) and non-specific EKG changes, we have agreed to obtain a coronary CTA for further assessment and risk stratification. No medication changes today.  Palpitations with PACs, PVCs, and PSVT Symptoms resolved with addition of low-dose metoprolol. No further intervention or testing at this time. Mild hypokalemia noted at time of recent ED visit, which wa repleted at the time. I will defer follow-up testing and further management of hypokalemia to Dr. Derrel Nip.  Hypertension BP is normal today. Continue current medications.  Follow-up: Return to clinic in 3 months, sooner if significant abnormalities seen on CT.  Nelva Bush, MD 02/27/2017 6:51 PM

## 2017-02-27 NOTE — Progress Notes (Signed)
GYNECOLOGY ANNUAL PREVENTATIVE CARE ENCOUNTER NOTE  Subjective:   [name] is a 46 y.o. [phone] female here for a routine annual gynecologic exam.  She is s/p TAH/LSO for pelvic pain and AUB. .Current complaints: none.   Denies abnormal vaginal bleeding, discharge, pelvic pain, problems with intercourse or other gynecologic concerns.    Gynecologic History No LMP recorded. Patient has had a hysterectomy. Contraception: status post hysterectomy Last mammogram: 02/14/2017. Results were: normal  Obstetric History OB History  Gravida Para Term Preterm AB Living  3 2 2   1 2   SAB TAB Ectopic Multiple Live Births      1   2    # Outcome Date GA Lbr Len/2nd Weight Sex Delivery Anes PTL Lv  3 Term           2 Term           1 Ectopic               Past Medical History:  Diagnosis Date  . Allergy   . Anemia   . Anxiety    claustrophobic  . Asthma   . Diabetes mellitus 2011   did not start metforfin, losing weight  . Headache disorder   . Hypertension   . Shoulder impingement syndrome, right     Past Surgical History:  Procedure Laterality Date  . ABDOMINAL HYSTERECTOMY  2006   heavy menses, endometriosis, l oophrectomy  . BACK SURGERY    . BREAST BIOPSY Right 2018   benign  . BREAST EXCISIONAL BIOPSY Right 2003   benign  . BREAST EXCISIONAL BIOPSY Right 1999   benign  . BREAST SURGERY     right breast x 2 , benign  . HERNIA REPAIR  2003   left inguinal   . LEFT OOPHORECTOMY    . SHOULDER ARTHROSCOPY WITH SUBACROMIAL DECOMPRESSION AND OPEN ROTATOR C Right 07/14/2016   Procedure: RIGHT SHOULDER ARTHROSCOPY WITH SUBACROMIAL DECOMPRESSION, DISTAL CLAVICLE RESECTION AND MINI OPEN ROTATOR CUFF REPAIR, OPEN BICEP TENDODESIS;  Surgeon: Garald Balding, MD;  Location: [facility];  Service: Orthopedics;  Laterality: Right;  . SHOULDER CLOSED REDUCTION Right 09/08/2016   Procedure: RIGHT CLOSED MANIPULATION SHOULDER;  Surgeon: Garald Balding, MD;   Location: [facility];  Service: Orthopedics;  Laterality: Right;  . TUBAL LIGATION      Current Outpatient Prescriptions on File Prior to Visit  Medication Sig Dispense Refill  . albuterol (PROVENTIL HFA;VENTOLIN HFA) 108 (90 BASE) MCG/ACT inhaler Inhale 2 puffs into the lungs every 6 (six) hours as needed for wheezing or shortness of breath. One puff as needed 3.7 g 1  . aspirin EC 81 MG tablet Take 81 mg by mouth daily.    [name] atorvastatin (LIPITOR) 20 MG tablet Take 1 tablet (20 mg total) by mouth daily. 90 tablet 3  . diazepam (VALIUM) 10 MG tablet Take 1 tablet (10 mg total) by mouth every 12 (twelve) hours as needed (muscle spasm). 30 tablet 3  . diltiazem (CARDIZEM LA) 240 MG 24 hr tablet Take 1 tablet (240 mg total) by mouth daily. 30 tablet 3  . EPINEPHrine 0.3 mg/0.3 mL IJ SOAJ injection Inject 0.3 mg into the muscle as needed (for allergic reaction).   3  . ketorolac (TORADOL) 10 MG tablet Take 1 tablet (10 mg total) by mouth every 6 (six) hours as needed. 30 tablet 1  . liraglutide 18 MG/3ML SOPN 1.8 mg once daily 12 mL 2  .  losartan-hydrochlorothiazide (HYZAAR) 50-12.5 MG tablet Take 1 tablet by mouth daily. 90 tablet 3  . meclizine (ANTIVERT) 25 MG tablet Take 25 mg by mouth 3 (three) times daily as needed for dizziness.    . methocarbamol (ROBAXIN) 500 MG tablet Take 1 tablet (500 mg total) by mouth every 8 (eight) hours as needed for muscle spasms. 40 tablet 0  . metoprolol tartrate (LOPRESSOR) 25 MG tablet Take 0.5 tablets (12.5 mg total) by mouth daily. 45 tablet 3  . ondansetron (ZOFRAN) 4 MG tablet Take 1 tablet (4 mg total) by mouth every 8 (eight) hours as needed for nausea or vomiting. 10 tablet 0  . oxyCODONE-acetaminophen (ROXICET) 5-325 MG tablet Take 1-2 tablets by mouth every 4 (four) hours as needed for severe pain. 40 tablet 0  . predniSONE (DELTASONE) 20 MG tablet Take 40 mg by mouth daily for 3 days, then 20mg  by mouth daily for 3 days, then 10mg  daily  for 3 days 12 tablet 0  . UNIFINE PENTIPS 31G X 5 MM MISC USE AS DIRECTED WITH VICTOZA 100 each 3   No current facility-administered medications on file prior to visit.     Allergies  Allergen Reactions  . Botox [Onabotulinumtoxina] Shortness Of Breath    syncope  . Clindamycin/Lincomycin Other (See Comments)  . Kiwi Extract Shortness Of Breath  . Maxalt [Rizatriptan Benzoate] Anaphylaxis    Chest pain  . Nitrous Oxide Shortness Of Breath    syncope  . Triptans Shortness Of Breath  . Aspirin Nausea And Vomiting    Mouth blisters  . Flagyl [Metronidazole]   . Hydrocodone-Acetaminophen Other (See Comments)  . Latex     Sometimes causes rash  . Mango Flavor   . Rizatriptan Other (See Comments)  . Tetracycline Nausea And Vomiting  . Vicodin [Hydrocodone-Acetaminophen]     hallucinations  . Flu Virus Vaccine Palpitations  . Penicillins Rash, Other (See Comments) and Nausea And Vomiting  . Tetracyclines & Related Rash    Social History   Social History  . Marital status: Divorced    Spouse name: N/A  . Number of children: N/A  . Years of education: N/A   Occupational History  . Not on file.   Social History Main Topics  . Smoking status: Never Smoker  . Smokeless tobacco: Never Used  . Alcohol use No  . Drug use: No  . Sexual activity: Not Currently    Partners: Male    Birth control/ protection: Surgical   Other Topics Concern  . Not on file   Social History Narrative  . No narrative on file    Family History  Problem Relation Age of Onset  . Diabetes Mother   . Heart disease Mother 34  . Hypertension Mother   . Hyperlipidemia Mother   . Heart failure Mother   . Cancer Father 71       Lung Cancer  . Heart disease Father   . Mental illness Sister        bipolar, substance abuse,  clean 2 yrs  . Hypertension Sister   . Cancer Paternal Grandmother 30       breast cancer  . Breast cancer Paternal Grandmother   . Diabetes Son     The following  portions of the patient's history were reviewed and updated as appropriate: allergies, current medications, past family history, past medical history, past social history, past surgical history and problem list.  Review of Systems Pertinent items noted in HPI and remainder of  comprehensive ROS otherwise negative.   Objective:  There were no vitals taken for this visit.  CONSTITUTIONAL: Well-developed, well-nourished female in no acute distress.  HENT:  Normocephalic, atraumatic, External right and left ear normal. Oropharynx is clear and moist EYES: Conjunctivae and EOM are normal. Pupils are equal, round, and reactive to light. No scleral icterus.  NECK: Normal range of motion, supple, no masses.  Normal thyroid.  SKIN: Skin is warm and dry. No rash noted. Not diaphoretic. No erythema. No pallor. NEUROLOGIC: Alert and oriented to person, place, and time. Normal reflexes, muscle tone coordination. No cranial nerve deficit noted. PSYCHIATRIC: Normal mood and affect. Normal behavior. Normal judgment and thought content. CARDIOVASCULAR: Normal heart rate noted, regular rhythm RESPIRATORY: Clear to auscultation bilaterally. Effort and breath sounds normal, no problems with respiration noted. BREASTS: Symmetric in size. No masses, skin changes, nipple drainage, or lymphadenopathy. ABDOMEN: Soft, normal bowel sounds, no distention noted.  No tenderness, rebound or guarding.  PELVIC: Normal appearing external genitalia; normal appearing vaginal mucosa and cervix cuff. No irritation noted on vulva or vagina. White discharge noted, sample obtained.  No adnexal tenderness. MUSCULOSKELETAL: Normal range of motion. No tenderness.  No cyanosis, clubbing, or edema.  2+ distal pulses.  Assessment and Plan:  1. Well woman exam with routine gynecological exam Normal exam.  Desires annual STI check. Labs drawn, will follow up results and manage accordingly. - HIV antibody - RPR - Hepatitis C antibody -  Hepatitis B surface antigen - Cervicovaginal ancillary only Routine preventative health maintenance measures emphasized. Please refer to After Visit Summary for other counseling recommendations.    Verita Schneiders, MD, [place] Attending Obstetrician & Gynecologist, [place] for [facility]

## 2017-02-27 NOTE — Patient Instructions (Signed)
Medication Instructions:  Your physician recommends that you continue on your current medications as directed. Please refer to the Current Medication list given to you today.   Labwork: None   Testing/Procedures: Schedule an appointment for a Coronary CTA.  Follow-Up: Your physician recommends that you schedule a follow-up appointment in: 3 months with Dr End.         If you need a refill on your cardiac medications before your next appointment, please call your pharmacy.

## 2017-02-27 NOTE — Patient Instructions (Signed)
Return to clinic for any scheduled appointments or for any gynecologic concerns as needed.   

## 2017-02-28 ENCOUNTER — Encounter: Payer: Self-pay | Admitting: Obstetrics & Gynecology

## 2017-02-28 ENCOUNTER — Ambulatory Visit: Payer: 59 | Admitting: Internal Medicine

## 2017-03-01 LAB — HEPATITIS B SURFACE ANTIGEN: Hepatitis B Surface Ag: NEGATIVE

## 2017-03-01 LAB — CERVICOVAGINAL ANCILLARY ONLY
Bacterial vaginitis: NEGATIVE
Candida vaginitis: NEGATIVE
Chlamydia: NEGATIVE
Neisseria Gonorrhea: NEGATIVE
Trichomonas: NEGATIVE

## 2017-03-01 LAB — HEPATITIS C ANTIBODY: Hep C Virus Ab: <0.1 s/co ratio (ref 0.0–0.9)

## 2017-03-01 LAB — RPR: RPR Ser Ql: NONREACTIVE

## 2017-03-01 LAB — HIV ANTIBODY (ROUTINE TESTING W REFLEX): HIV Screen 4th Generation wRfx: NONREACTIVE

## 2017-03-02 ENCOUNTER — Telehealth: Payer: Self-pay | Admitting: *Deleted

## 2017-03-02 MED ORDER — LIRAGLUTIDE 18 MG/3ML ~~LOC~~ SOPN: PEN_INJECTOR | SUBCUTANEOUS | 0 refills | Status: DC

## 2017-03-02 MED FILL — VICTOZA 18 MG/3 ML INJECT P: 18 | 30 days supply | Qty: 9 | Fill #0

## 2017-03-02 NOTE — Telephone Encounter (Signed)
Pt has an appt scheduled with Dr. Derrel Nip on 03/22/2017. Pt stated that she will be out of her victoza in 6 days. The pt was wanting to know if she needed to refill the victoza and continue that until she is able to see Dr. Derrel Nip or if she needed to switch to something else in the time. I advised the pt that she should go ahead and refill the victoza and continue that until her appt. Sent in one refill of the Jakaylah Schlafer for pt since she was about to run out.

## 2017-03-02 NOTE — Telephone Encounter (Signed)
Does pt need to schedule an appt to discuss options with you?

## 2017-03-02 NOTE — Telephone Encounter (Signed)
Error

## 2017-03-02 NOTE — Telephone Encounter (Signed)
Pt stated that as of Oct 1 her Insurance will no longer accept Victoza. Pt requested a call to discuss the change in medication.  Pt contact [phone]

## 2017-03-02 NOTE — Telephone Encounter (Signed)
Needs 3 month follow up on diabetes,  Can't do it by phone

## 2017-03-09 ENCOUNTER — Ambulatory Visit: Payer: 59 | Admitting: Internal Medicine

## 2017-03-09 MED FILL — ATORVASTATIN 20 MG TABLET: 20 | 90 days supply | Qty: 90 | Fill #1

## 2017-03-13 ENCOUNTER — Encounter: Payer: Self-pay | Admitting: Internal Medicine

## 2017-03-17 ENCOUNTER — Encounter: Payer: 59 | Admitting: Physician Assistant

## 2017-03-22 ENCOUNTER — Ambulatory Visit (INDEPENDENT_AMBULATORY_CARE_PROVIDER_SITE_OTHER): Payer: 59 | Admitting: Internal Medicine

## 2017-03-22 ENCOUNTER — Encounter: Payer: Self-pay | Admitting: Internal Medicine

## 2017-03-22 VITALS — BP 114/86 | HR 85 | Temp 98.4°F | Resp 15 | Ht 66.0 in | Wt 220.4 lb

## 2017-03-22 DIAGNOSIS — T50Z95S Adverse effect of other vaccines and biological substances, sequela: Secondary | ICD-10-CM | POA: Diagnosis not present

## 2017-03-22 DIAGNOSIS — I10 Essential (primary) hypertension: Secondary | ICD-10-CM

## 2017-03-22 DIAGNOSIS — E876 Hypokalemia: Secondary | ICD-10-CM

## 2017-03-22 DIAGNOSIS — E1165 Type 2 diabetes mellitus with hyperglycemia: Secondary | ICD-10-CM

## 2017-03-22 DIAGNOSIS — R002 Palpitations: Secondary | ICD-10-CM

## 2017-03-22 LAB — COMPREHENSIVE METABOLIC PANEL
ALT: 22 U/L (ref 0–35)
AST: 18 U/L (ref 0–37)
Albumin: 4.3 g/dL (ref 3.5–5.2)
Alkaline Phosphatase: 99 U/L (ref 39–117)
BUN: 15 mg/dL (ref 6–23)
CO2: 24 mEq/L (ref 19–32)
Calcium: 9.4 mg/dL (ref 8.4–10.5)
Chloride: 105 mEq/L (ref 96–112)
Creatinine, Ser: 0.75 mg/dL (ref 0.40–1.20)
GFR: 106.23 mL/min (ref >60.00)
Glucose, Bld: 175 mg/dL — ABNORMAL HIGH (ref 70–99)
Potassium: 3.6 mEq/L (ref 3.5–5.1)
Sodium: 137 mEq/L (ref 135–145)
Total Bilirubin: 0.6 mg/dL (ref 0.2–1.2)
Total Protein: 7.4 g/dL (ref 6.0–8.3)

## 2017-03-22 LAB — LIPID PANEL
Cholesterol: 146 mg/dL (ref 0–200)
HDL: 38.4 mg/dL — ABNORMAL LOW (ref >39.00)
LDL Cholesterol: 85 mg/dL (ref 0–99)
NonHDL: 107.43
Total CHOL/HDL Ratio: 4
Triglycerides: 111 mg/dL (ref 0.0–149.0)
VLDL: 22.2 mg/dL (ref 0.0–40.0)

## 2017-03-22 LAB — HEMOGLOBIN A1C: Hgb A1c MFr Bld: 8.3 % — ABNORMAL HIGH (ref 4.6–6.5)

## 2017-03-22 MED ORDER — DULAGLUTIDE 0.75 MG/0.5ML ~~LOC~~ SOAJ
0.7500 mg | SUBCUTANEOUS | 1 refills | Status: DC
Start: 2017-03-22 — End: 2017-04-25

## 2017-03-22 NOTE — Progress Notes (Signed)
Subjective:  Patient ID: [name], female    DOB: 04/10/1971  Age: 46 y.o. MRN: [id number]  CC: The primary encounter diagnosis was Hypokalemia. Diagnoses of Uncontrolled type 2 diabetes mellitus with hyperglycemia ([place]), Adverse reaction to vaccine, sequela, Palpitations, and Hypertension goal BP (blood pressure) < 130/80 were also pertinent to this visit.  HPI [name] presents for  .3 month follow up on diabetes.  Patient has no complaints today.   Patient is not exercising or  intentionally trying to lose weight .  Patient has had an eye exam in the last 12 months and checks feet regularly for signs of infection.  Patient does not walk barefoot outside,  And denies an numbness tingling or burning in feet. Patient is up to date on all recommended vaccinations  has only lost 2 lb s since starting victoza .  No longer covered,  Does not want the [facility] program despite the coverage  Of medication it would give her because she does not have the time to attend a class and provide the feedback. Trulicity discussed as an alternative   Not checking sugars on a regular basis , but fastings  reported to be 136 or lower.  Post prandials have been as high as 220 .  not exercising    ER follow up.  Seen on 9/22 with symptoms concerning for adverse reaction to flu vaccine despite premedicating with Benadryl:  Scratchy throat,  Chest tightness,  Left arm tingling . Exam was entirely normal,  Was given prednisone taper and epi pen.   Seen by cardiology on sept 24,  Cardiac CT planned and apparently to be done tomorrow  . holter monitor normal in august  Except for pvcs and pacs . [name] Low dose metoprolol started at 12.5 mg in early September   Had CPE sept 24   Outpatient Medications Prior to Visit  Medication Sig Dispense Refill  . albuterol (PROVENTIL HFA;VENTOLIN HFA) 108 (90 BASE) MCG/ACT inhaler Inhale 2 puffs into the lungs every 6 (six) hours as needed for wheezing or shortness of  breath. One puff as needed 3.7 g 1  . aspirin EC 81 MG tablet Take 81 mg by mouth daily.    [name] atorvastatin (LIPITOR) 20 MG tablet Take 1 tablet (20 mg total) by mouth daily. 90 tablet 3  . diazepam (VALIUM) 10 MG tablet Take 1 tablet (10 mg total) by mouth every 12 (twelve) hours as needed (muscle spasm). 30 tablet 3  . diltiazem (CARDIZEM LA) 240 MG 24 hr tablet Take 1 tablet (240 mg total) by mouth daily. 30 tablet 3  . EPINEPHrine 0.3 mg/0.3 mL IJ SOAJ injection Inject 0.3 mg into the muscle as needed (for allergic reaction).   3  . ketorolac (TORADOL) 10 MG tablet Take 1 tablet (10 mg total) by mouth every 6 (six) hours as needed. 30 tablet 1  . liraglutide 18 MG/3ML SOPN 1.8 mg once daily 12 mL 0  . losartan-hydrochlorothiazide (HYZAAR) 50-12.5 MG tablet Take 1 tablet by mouth daily. 90 tablet 3  . meclizine (ANTIVERT) 25 MG tablet Take 25 mg by mouth 3 (three) times daily as needed for dizziness.    . methocarbamol (ROBAXIN) 500 MG tablet Take 1 tablet (500 mg total) by mouth every 8 (eight) hours as needed for muscle spasms. 40 tablet 0  . metoprolol tartrate (LOPRESSOR) 25 MG tablet Take 0.5 tablets (12.5 mg total) by mouth daily. 45 tablet 3  . ondansetron (ZOFRAN) 4 MG tablet  Take 1 tablet (4 mg total) by mouth every 8 (eight) hours as needed for nausea or vomiting. 10 tablet 0  . oxyCODONE-acetaminophen (ROXICET) 5-325 MG tablet Take 1-2 tablets by mouth every 4 (four) hours as needed for severe pain. 40 tablet 0  . UNIFINE PENTIPS 31G X 5 MM MISC USE AS DIRECTED WITH VICTOZA 100 each 3  . predniSONE (DELTASONE) 20 MG tablet Take 40 mg by mouth daily for 3 days, then 20mg  by mouth daily for 3 days, then 10mg  daily for 3 days (Patient not taking: Reported on 03/22/2017) 12 tablet 0   No facility-administered medications prior to visit.     Review of Systems;  Patient denies headache, fevers, malaise, unintentional weight loss, skin rash, eye pain, sinus congestion and sinus pain, sore  throat, dysphagia,  hemoptysis , cough, dyspnea, wheezing, chest pain, palpitations, orthopnea, edema, abdominal pain, nausea, melena, diarrhea, constipation, flank pain, dysuria, hematuria, urinary  Frequency, nocturia, numbness, tingling, seizures,  Focal weakness, Loss of consciousness,  Tremor, insomnia, depression, anxiety, and suicidal ideation.      Objective:  BP 114/86 (BP Location: Left Arm, Patient Position: Sitting, Cuff Size: Large)   Pulse 85   Temp 98.4 F (36.9 C) (Oral)   Resp 15   Ht 5\' 6"  (1.676 m)   Wt 220 lb 6.4 oz (100 kg)   SpO2 98%   BMI 35.57 kg/m   BP Readings from Last 3 Encounters:  03/23/17 (!) 111/96  03/23/17 99/63  03/22/17 114/86    Wt Readings from Last 3 Encounters:  03/22/17 220 lb 6.4 oz (100 kg)  02/28/17 222 lb (100.7 kg)  02/27/17 222 lb (100.7 kg)    General appearance: alert, cooperative and appears stated age Ears: normal TM's and external ear canals both ears Throat: lips, mucosa, and tongue normal; teeth and gums normal Neck: no adenopathy, no carotid bruit, supple, symmetrical, trachea midline and thyroid not enlarged, symmetric, no tenderness/mass/nodules Back: symmetric, no curvature. ROM normal. No CVA tenderness. Lungs: clear to auscultation bilaterally Heart: regular rate and rhythm, S1, S2 normal, no murmur, click, rub or gallop Abdomen: soft, non-tender; bowel sounds normal; no masses,  no organomegaly Pulses: 2+ and symmetric Skin: Skin color, texture, turgor normal. No rashes or lesions Lymph nodes: Cervical, supraclavicular, and axillary nodes normal.  Lab Results  Component Value Date   HGBA1C 8.3 (H) 03/22/2017   HGBA1C 7.0 (H) 11/18/2016   HGBA1C 10.0 (H) 08/17/2016    Lab Results  Component Value Date   CREATININE 0.75 03/22/2017   CREATININE 0.81 02/24/2017   CREATININE 0.79 11/07/2016    Lab Results  Component Value Date   WBC 10.1 02/24/2017   HGB 13.3 02/24/2017   HCT 40.3 02/24/2017   PLT 224  02/24/2017   GLUCOSE 175 (H) 03/22/2017   CHOL 146 03/22/2017   TRIG 111.0 03/22/2017   HDL 38.40 (L) 03/22/2017   LDLDIRECT 98.0 08/17/2016   LDLCALC 85 03/22/2017   ALT 22 03/22/2017   AST 18 03/22/2017   NA 137 03/22/2017   K 3.6 03/22/2017   CL 105 03/22/2017   CREATININE 0.75 03/22/2017   BUN 15 03/22/2017   CO2 24 03/22/2017   TSH 1.06 11/07/2016   HGBA1C 8.3 (H) 03/22/2017   MICROALBUR 2.4 (H) 05/18/2016    No results found.  Assessment & Plan:   Problem List Items Addressed This Visit    Adverse reaction to vaccine, sequela    2ND TIME DESPITE  PRE TREATMENT.  INFLUENZA  Hypertension goal BP (blood pressure) < 130/80    Well controlled on current regimen. Renal function stable, potassium normal.  no changes today.  Lab Results  Component Value Date   CREATININE 0.75 03/22/2017   Lab Results  Component Value Date   NA 137 03/22/2017   K 3.6 03/22/2017   CL 105 03/22/2017   CO2 24 03/22/2017         Palpitations    PACs and PVCs by recent Holter monitor .  controlled with metoprolol       Uncontrolled type 2 diabetes mellitus ([place])    Loss of control since June despite use of victoza until just recently. Starting Trulicity  Since Victoza is not covered.  Strongly encouraged to find time to exercise.  Has declined [employer]  May need to start insulin pending review of Bs once Trlucitiy is started.       Relevant Medications   Dulaglutide (TRULICITY) 4.40 HK/7.4QV SOPN   Other Relevant Orders   Lipid panel (Completed)   Comprehensive metabolic panel (Completed)    Other Visit Diagnoses    Hypokalemia    -  Primary   Relevant Orders   Hemoglobin A1c (Completed)     A total of 25 minutes of face to face time was spent with patient more than half of which was spent in counselling about the above mentioned conditions  and coordination of care   I have discontinued Ms. Troxler's predniSONE. I am also having her start on Dulaglutide.  Additionally, I am having her maintain her meclizine, albuterol, ondansetron, ketorolac, atorvastatin, EPINEPHrine, losartan-hydrochlorothiazide, methocarbamol, aspirin EC, oxyCODONE-acetaminophen, diltiazem, diazepam, metoprolol tartrate, UNIFINE PENTIPS, and liraglutide.  Meds ordered this encounter  Medications  . Dulaglutide (TRULICITY) 9.56 LO/7.5IE SOPN    Sig: Inject 0.75 mg into the skin once a week.    Dispense:  2 mL    Refill:  1    Medications Discontinued During This Encounter  Medication Reason  . predniSONE (DELTASONE) 20 MG tablet Patient has not taken in last 30 days    Follow-up: No Follow-up on file.   Crecencio Mc, MD

## 2017-03-22 NOTE — Patient Instructions (Signed)
Finish the [employer];  Start the [employer] one week later   Dulaglutide injection What is this medicine? DULAGLUTIDE (DOO la GLOO tide) is used to improve blood sugar control in adults with type 2 diabetes. This medicine may be used with other oral diabetes medicines. This medicine may be used for other purposes; ask your health care provider or pharmacist if you have questions. COMMON BRAND NAME(S): TRULICITY What should I tell my health care provider before I take this medicine? They need to know if you have any of these conditions: -endocrine tumors (MEN 2) or if someone in your family had these tumors -history of pancreatitis -kidney disease -liver disease -stomach problems -thyroid cancer or if someone in your family had thyroid cancer -an unusual or allergic reaction to dulaglutide, other medicines, foods, dyes, or preservatives -pregnant or trying to get pregnant -breast-feeding How should I use this medicine? This medicine is for injection under the skin of your upper leg (thigh), stomach area, or upper arm. It is usually given once every week (every 7 days). You will be taught how to prepare and give this medicine. Use exactly as directed. Take your medicine at regular intervals. Do not take it more often than directed. If you use this medicine with insulin, you should inject this medicine and the insulin separately. Do not mix them together. Do not give the injections right next to each other. Change (rotate) injection sites with each injection. It is important that you put your used needles and syringes in a special sharps container. Do not put them in a trash can. If you do not have a sharps container, call your pharmacist or healthcare provider to get one. A special MedGuide will be given to you by the pharmacist with each prescription and refill. Be sure to read this information carefully each time. Talk to your pediatrician regarding the use of this medicine in children. Special  care may be needed. Overdosage: If you think you have taken too much of this medicine contact a poison control center or emergency room at once. NOTE: This medicine is only for you. Do not share this medicine with others. What if I miss a dose? If you miss a dose, take it as soon as you can within 3 days after the missed dose. Then take your next dose at your regular weekly time. If it has been longer than 3 days after the missed dose, do not take the missed dose. Take the next dose at your regular time. Do not take double or extra doses. If you have questions about a missed dose, contact your health care provider for advice. What may interact with this medicine? -other medicines for diabetes Many medications may cause changes in blood sugar, these include: -alcohol containing beverages -antiviral medicines for HIV or AIDS -aspirin and aspirin-like drugs -certain medicines for blood pressure, heart disease, irregular heart beat -chromium -diuretics -female hormones, such as estrogens or progestins, birth control pills -fenofibrate -gemfibrozil -isoniazid -lanreotide -female hormones or anabolic steroids -MAOIs like Carbex, Eldepryl, Marplan, Nardil, and Parnate -medicines for weight loss -medicines for allergies, asthma, cold, or cough -medicines for depression, anxiety, or psychotic disturbances -niacin -nicotine -NSAIDs, medicines for pain and inflammation, like ibuprofen or naproxen -octreotide -pasireotide -pentamidine -phenytoin -probenecid -quinolone antibiotics such as ciprofloxacin, levofloxacin, ofloxacin -some herbal dietary supplements -steroid medicines such as prednisone or cortisone -sulfamethoxazole; trimethoprim -thyroid hormones Some medications can hide the warning symptoms of low blood sugar (hypoglycemia). You may need to monitor your blood sugar more  closely if you are taking one of these medications. These include: -beta-blockers, often used for high blood  pressure or heart problems (examples include atenolol, metoprolol, propranolol) -clonidine -guanethidine -reserpine This list may not describe all possible interactions. Give your health care provider a list of all the medicines, herbs, non-prescription drugs, or dietary supplements you use. Also tell them if you smoke, drink alcohol, or use illegal drugs. Some items may interact with your medicine. What should I watch for while using this medicine? Visit your doctor or health care professional for regular checks on your progress. Drink plenty of fluids while taking this medicine. Check with your doctor or health care professional if you get an attack of severe diarrhea, nausea, and vomiting. The loss of too much body fluid can make it dangerous for you to take this medicine. A test called the HbA1C (A1C) will be monitored. This is a simple blood test. It measures your blood sugar control over the last 2 to 3 months. You will receive this test every 3 to 6 months. Learn how to check your blood sugar. Learn the symptoms of low and high blood sugar and how to manage them. Always carry a quick-source of sugar with you in case you have symptoms of low blood sugar. Examples include hard sugar candy or glucose tablets. Make sure others know that you can choke if you eat or drink when you develop serious symptoms of low blood sugar, such as seizures or unconsciousness. They must get medical help at once. Tell your doctor or health care professional if you have high blood sugar. You might need to change the dose of your medicine. If you are sick or exercising more than usual, you might need to change the dose of your medicine. Do not skip meals. Ask your doctor or health care professional if you should avoid alcohol. Many nonprescription cough and cold products contain sugar or alcohol. These can affect blood sugar. Pens should never be shared. Even if the needle is changed, sharing may result in passing of  viruses like hepatitis or HIV. Wear a medical ID bracelet or chain, and carry a card that describes your disease and details of your medicine and dosage times. What side effects may I notice from receiving this medicine? Side effects that you should report to your doctor or health care professional as soon as possible: -allergic reactions like skin rash, itching or hives, swelling of the face, lips, or tongue -breathing problems -diarrhea that continues or is severe -lump or swelling on the neck -severe nausea -signs and symptoms of infection like fever or chills; cough; sore throat; pain or trouble passing urine -signs and symptoms of low blood sugar such as feeling anxious, confusion, dizziness, increased hunger, unusually weak or tired, sweating, shakiness, cold, irritable, headache, blurred vision, fast heartbeat, loss of consciousness -signs and symptoms of kidney injury like trouble passing urine or change in the amount of urine -trouble swallowing -unusual stomach upset or pain -vomiting Side effects that usually do not require medical attention (report to your doctor or health care professional if they continue or are bothersome): -diarrhea -loss of appetite -nausea -pain, redness, or irritation at site where injected -stomach upset This list may not describe all possible side effects. Call your doctor for medical advice about side effects. You may report side effects to FDA at 1-800-FDA-1088. Where should I keep my medicine? Keep out of the reach of children. Store unopened pens in a refrigerator between 2 and 8 degrees C (  36 and 46 degrees F). Do not freeze or use if the medicine has been frozen. Protect from light and excessive heat. Store in the carton until use. Each single-dose pen can be kept at room temperature, not to exceed 30 degrees C (86 degrees F) for a total of 14 days, if needed. Throw away any unused medicine after the expiration date on the label. NOTE: This sheet  is a summary. It may not cover all possible information. If you have questions about this medicine, talk to your doctor, pharmacist, or health care provider.  2018 Elsevier/Gold Standard (2016-06-09 14:35:01)

## 2017-03-23 ENCOUNTER — Ambulatory Visit (HOSPITAL_COMMUNITY)
Admission: RE | Admit: 2017-03-23 | Discharge: 2017-03-23 | Disposition: A | Discharge status: Home / Self Care | Payer: 59 | Source: Ambulatory Visit | Attending: Internal Medicine | Admitting: Internal Medicine

## 2017-03-23 ENCOUNTER — Encounter (HOSPITAL_COMMUNITY): Payer: Self-pay

## 2017-03-23 ENCOUNTER — Ambulatory Visit (HOSPITAL_COMMUNITY): Payer: 59

## 2017-03-23 DIAGNOSIS — I351 Nonrheumatic aortic (valve) insufficiency: Secondary | ICD-10-CM | POA: Diagnosis not present

## 2017-03-23 DIAGNOSIS — R079 Chest pain, unspecified: Secondary | ICD-10-CM | POA: Diagnosis not present

## 2017-03-23 DIAGNOSIS — I340 Nonrheumatic mitral (valve) insufficiency: Secondary | ICD-10-CM | POA: Diagnosis not present

## 2017-03-23 MED ORDER — METOPROLOL TARTRATE 5 MG/5ML IV SOLN
5.0000 mg | INTRAVENOUS | Status: DC | PRN
  Administered 2017-03-23 (×3): 5 mg via INTRAVENOUS
  Filled 2017-03-23 (×4): qty 5

## 2017-03-23 MED ORDER — IOPAMIDOL (ISOVUE-370) INJECTION 76%
INTRAVENOUS | Status: AC
  Filled 2017-03-23: qty 100

## 2017-03-23 MED ORDER — NITROGLYCERIN 0.4 MG SL SUBL
SUBLINGUAL_TABLET | SUBLINGUAL | Status: AC
  Filled 2017-03-23: qty 2

## 2017-03-23 MED ORDER — METOPROLOL TARTRATE 5 MG/5ML IV SOLN
INTRAVENOUS | Status: AC
  Administered 2017-03-23: 5 mg via INTRAVENOUS
  Filled 2017-03-23: qty 15

## 2017-03-23 MED ORDER — NITROGLYCERIN 0.4 MG SL SUBL
0.8000 mg | SUBLINGUAL_TABLET | SUBLINGUAL | Status: DC | PRN
  Administered 2017-03-23: 0.8 mg via SUBLINGUAL
  Filled 2017-03-23 (×2): qty 25

## 2017-03-23 MED ORDER — IOPAMIDOL (ISOVUE-370) INJECTION 76%: 80.0000 mL | Freq: Once | INTRAVENOUS | Status: DC | PRN

## 2017-03-25 DIAGNOSIS — T50Z95S Adverse effect of other vaccines and biological substances, sequela: Secondary | ICD-10-CM | POA: Insufficient documentation

## 2017-03-25 NOTE — Assessment & Plan Note (Signed)
PACs and PVCs by recent Holter monitor .  controlled with metoprolol

## 2017-03-25 NOTE — Assessment & Plan Note (Signed)
2ND TIME DESPITE  PRE TREATMENT.  INFLUENZA

## 2017-03-25 NOTE — Assessment & Plan Note (Signed)
Well controlled on current regimen. Renal function stable, potassium normal.  no changes today.  Lab Results  Component Value Date   CREATININE 0.75 03/22/2017   Lab Results  Component Value Date   NA 137 03/22/2017   K 3.6 03/22/2017   CL 105 03/22/2017   CO2 24 03/22/2017

## 2017-03-25 NOTE — Assessment & Plan Note (Addendum)
Loss of control since June despite use of victoza until just recently. Starting Trulicity  Since Victoza is not covered.  Strongly encouraged to find time to exercise.  Has declined [employer]  May need to start insulin pending review of Bs once Trlucitiy is started.

## 2017-03-27 ENCOUNTER — Other Ambulatory Visit: Payer: Self-pay | Admitting: Internal Medicine

## 2017-03-27 MED FILL — diazePAM 10 MG TABS: 10 | 15 days supply | Qty: 30 | Fill #1

## 2017-03-27 MED FILL — DILTIAZEM ER 240 MG TABLET: 240 | 30 days supply | Qty: 30 | Fill #0

## 2017-04-04 ENCOUNTER — Telehealth: Payer: Self-pay

## 2017-04-04 NOTE — Telephone Encounter (Signed)
PA has been submitted for Trulicity on covermymeds

## 2017-04-13 MED FILL — TRULICITY 0.75 MG/0.5 ML PE: 0.75 | 28 days supply | Qty: 2 | Fill #0

## 2017-04-25 ENCOUNTER — Ambulatory Visit: Payer: Self-pay | Admitting: *Deleted

## 2017-04-25 ENCOUNTER — Telehealth: Payer: Self-pay | Admitting: Internal Medicine

## 2017-04-25 MED ORDER — DULAGLUTIDE 0.75 MG/0.5ML ~~LOC~~ SOAJ: 1.5000 mg | SUBCUTANEOUS | 1 refills | Status: DC

## 2017-04-25 MED FILL — DILTIAZEM ER 240 MG TABLET: 240 | 30 days supply | Qty: 30 | Fill #1

## 2017-04-25 NOTE — Addendum Note (Signed)
Addended by: Nanci Pina on: 04/25/2017 05:07 PM   Modules accepted: Orders

## 2017-04-25 NOTE — Telephone Encounter (Signed)
Please advise 

## 2017-04-25 NOTE — Telephone Encounter (Signed)
Copied from [place] [id number]. Topic: Quick Communication - See Telephone Encounter >> Apr 25, 2017  5:19 PM Boyd Kerbs wrote: CRM for notification. See Telephone encounter for:   [facility] Tr[facility], prescription reads inject 1.5 per week.  The pen ordered is .75 , so does she need 2 pens or a prescription for a 1.5 pen.  Verifying, please call pharmacy 04/25/17.

## 2017-04-25 NOTE — Telephone Encounter (Signed)
Patient went to sign up for [facility] yesterday and her appointment with PCP is not til January , patient has been on Trulicity for 2 weeks and fasting and non -fasting cbg's are higher than with victoza , fasting yesterday was 349 with Trulicity, night before for dinner had BBQ ribs and collard and mac & cheese with H2O. Post prandial today was 224 after breakfast peanut butter jelly sandwich lite jelly and water.

## 2017-04-25 NOTE — Telephone Encounter (Signed)
she can increase the dose of trulicity to 1.5 mg once a week and she needs to rethink her diet.    Mac n cheese  And "lite" jelly sandwich are still hi carb and are not part of a weight loss or diebetic diet    Breakfast should be more along the lines of a Premier protein shake,  Scrambled eggs with /meat,  Or one of  the [employer] fritattas or egg sandwhich

## 2017-04-25 NOTE — Telephone Encounter (Signed)
Patient advised of MD orders and voiced understanding.

## 2017-04-25 NOTE — Telephone Encounter (Signed)
She called in with concerns about her glucose readings being high since she started on the Trulicity last Tuesday.  Glucose is usually around 170s but as of yesterday it was up to 244.   She also started the [place] program yesterday.   She wants to talk with Juliann Pulse in Dr. Lupita Dawn office regarding all of this. A note was routed back for someone with Dr. Lupita Dawn nurse pool to call the pt back.

## 2017-04-26 MED ORDER — DULAGLUTIDE 1.5 MG/0.5ML ~~LOC~~ SOAJ: 1.5000 mg | SUBCUTANEOUS | 11 refills | Status: DC

## 2017-04-26 NOTE — Telephone Encounter (Signed)
New rx sent for the 1.5 mg pen to [facility]

## 2017-04-26 NOTE — Telephone Encounter (Signed)
Please clarify which one you would like filled.

## 2017-04-28 MED FILL — TRULICITY 1.5 MG/0.5 ML PEN: 1.5 | 28 days supply | Qty: 2 | Fill #0

## 2017-05-05 ENCOUNTER — Encounter: Payer: Self-pay | Admitting: Physician Assistant

## 2017-05-05 ENCOUNTER — Ambulatory Visit: Payer: 59 | Admitting: Physician Assistant

## 2017-05-05 VITALS — BP 109/77 | HR 77 | Wt 221.0 lb

## 2017-05-05 DIAGNOSIS — G43709 Chronic migraine without aura, not intractable, without status migrainosus: Secondary | ICD-10-CM

## 2017-05-05 DIAGNOSIS — IMO0002 Reserved for concepts with insufficient information to code with codable children: Secondary | ICD-10-CM

## 2017-05-05 DIAGNOSIS — G43009 Migraine without aura, not intractable, without status migrainosus: Secondary | ICD-10-CM

## 2017-05-05 MED ORDER — GALCANEZUMAB-GNLM 120 MG/ML ~~LOC~~ SOAJ: SUBCUTANEOUS | 11 refills | Status: DC

## 2017-05-05 NOTE — Progress Notes (Signed)
History:  [name] is a 46 y.o. G3P2010 who presents to clinic today for migraine.  She has not seen any improvement with her headaches.  She discontinued zonisamide as it was not helping.  She has had a worse time with her diabetes on trulicity (which was a change initiated by her insurance coverage).  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    HIT6:55 Number of days in the last 4 weeks with:  Severe headache: 2 Moderate headache: 21 Mild headache: 3  No headache: 2   Past Medical History:  Diagnosis Date  . Allergy   . Anemia   . Anxiety    claustrophobic  . Asthma   . Diabetes mellitus 2011   did not start metforfin, losing weight  . Headache disorder   . Hypertension   . Shoulder impingement syndrome, right     Social History   Socioeconomic History  . Marital status: Divorced    Spouse name: Not on file  . Number of children: Not on file  . Years of education: Not on file  . Highest education level: Not on file  Social Needs  . Financial resource strain: Not on file  . Food insecurity - worry: Not on file  . Food insecurity - inability: Not on file  . Transportation needs - medical: Not on file  . Transportation needs - non-medical: Not on file  Occupational History  . Not on file  Tobacco Use  . Smoking status: Never Smoker  . Smokeless tobacco: Never Used  Substance and Sexual Activity  . Alcohol use: No  . Drug use: No  . Sexual activity: Not Currently    Partners: Male    Birth control/protection: Surgical  Other Topics Concern  . Not on file  Social History Narrative  . Not on file    Family History  Problem Relation Age of Onset  . Diabetes Mother   . Heart disease Mother 70  . Hypertension Mother   . Hyperlipidemia Mother   . Heart failure Mother   . Cancer Father 87       Lung Cancer  . Heart disease Father   . Mental illness Sister        bipolar, substance abuse,  clean 2 yrs  . Hypertension Sister   .  Cancer Paternal Grandmother 38       breast cancer  . Breast cancer Paternal Grandmother   . Diabetes Son       Current Outpatient Medications on File Prior to Visit  Medication Sig Dispense Refill  . albuterol (PROVENTIL HFA;VENTOLIN HFA) 108 (90 BASE) MCG/ACT inhaler Inhale 2 puffs into the lungs every 6 (six) hours as needed for wheezing or shortness of breath. One puff as needed 3.7 g 1  . aspirin EC 81 MG tablet Take 81 mg by mouth daily.    [name] atorvastatin (LIPITOR) 20 MG tablet Take 1 tablet (20 mg total) by mouth daily. 90 tablet 3  . diazepam (VALIUM) 10 MG tablet Take 1 tablet (10 mg total) by mouth every 12 (twelve) hours as needed (muscle spasm). 30 tablet 3  . diltiazem (CARDIZEM LA) 240 MG 24 hr tablet TAKE 1 TABLET BY MOUTH DAILY 30 tablet 3  . Dulaglutide (TRULICITY) 1.5 WU/9.8JX SOPN Inject 1.5 mg into the skin once a week. 4 pen 11  . EPINEPHrine 0.3 mg/0.3 mL IJ SOAJ injection Inject 0.3 mg into the muscle as needed (for allergic  reaction).   3  . ketorolac (TORADOL) 10 MG tablet Take 1 tablet (10 mg total) by mouth every 6 (six) hours as needed. 30 tablet 1  . losartan-hydrochlorothiazide (HYZAAR) 50-12.5 MG tablet Take 1 tablet by mouth daily. 90 tablet 3  . meclizine (ANTIVERT) 25 MG tablet Take 25 mg by mouth 3 (three) times daily as needed for dizziness.    . methocarbamol (ROBAXIN) 500 MG tablet Take 1 tablet (500 mg total) by mouth every 8 (eight) hours as needed for muscle spasms. 40 tablet 0  . metoprolol tartrate (LOPRESSOR) 25 MG tablet Take 0.5 tablets (12.5 mg total) by mouth daily. 45 tablet 3  . ondansetron (ZOFRAN) 4 MG tablet Take 1 tablet (4 mg total) by mouth every 8 (eight) hours as needed for nausea or vomiting. 10 tablet 0  . oxyCODONE-acetaminophen (ROXICET) 5-325 MG tablet Take 1-2 tablets by mouth every 4 (four) hours as needed for severe pain. 40 tablet 0  . UNIFINE PENTIPS 31G X 5 MM MISC USE AS DIRECTED WITH VICTOZA 100 each 3   No current  facility-administered medications on file prior to visit.     Allergies  Allergen Reactions  . Botox [Onabotulinumtoxina] Shortness Of Breath    syncope  . Clindamycin/Lincomycin Other (See Comments)  . Kiwi Extract Shortness Of Breath  . Maxalt [Rizatriptan Benzoate] Anaphylaxis    Chest pain  . Nitrous Oxide Shortness Of Breath    syncope  . Triptans Shortness Of Breath  . Aspirin Nausea And Vomiting    Mouth blisters  . Flagyl [Metronidazole]   . Hydrocodone-Acetaminophen Other (See Comments)  . Latex     Sometimes causes rash  . Mango Flavor   . Rizatriptan Other (See Comments)  . Tetracycline Nausea And Vomiting  . Vicodin [Hydrocodone-Acetaminophen]     hallucinations  . Flu Virus Vaccine Palpitations  . Penicillins Rash, Other (See Comments) and Nausea And Vomiting    Has patient had a PCN reaction causing immediate rash, facial/tongue/throat swelling, SOB or lightheadedness with hypotension:  Has patient had a PCN reaction causing severe rash involving mucus membranes or skin necrosis:  Has patient had a PCN reaction that required hospitalization  Has patient had a PCN reaction occurring within the last 10 years: If all of the above answers are "NO", then may proceed with Cephalosporin use.   . Tetracyclines & Related Rash   Review of Systems:  All pertinent positive/negative included in HPI, all other review of systems are negative   Objective:  Physical Exam BP 109/77   Pulse 77   Wt 221 lb (100.2 kg)   BMI 35.67 kg/m  CONSTITUTIONAL: Well-developed, well-nourished female in no acute distress.  EYES: EOM intact ENT: Normocephalic CARDIOVASCULAR: Regular rate  RESPIRATORY: Normal rate.  MUSCULOSKELETAL: Normal ROM SKIN: Warm, dry without erythema  NEUROLOGICAL: Alert, oriented, CN II-XII grossly intact, Appropriate balance PSYCH: Normal behavior, mood   Assessment & Plan:  Assessment: 1. Chronic migraine   2. Migraine without aura and without status  migrainosus, not intractable      Plan: Will trial emgality for migraine prevention as nothing else seems to have helped.  Migraine just continues to worsen.   Long discussion of how to use.   Other meds unchanged   Follow-up in 3 months or sooner PRN  Paticia Stack, PA-C 05/05/2017 12:40 PM

## 2017-05-05 NOTE — Patient Instructions (Signed)

## 2017-05-08 MED FILL — EMGALITY 120 MG/ML SOAJ: 120 | 30 days supply | Qty: 2 | Fill #0

## 2017-05-11 MED FILL — METOPROLOL TARTRATE 25 MG T: 25 | 90 days supply | Qty: 45 | Fill #1

## 2017-05-15 ENCOUNTER — Other Ambulatory Visit: Payer: Self-pay

## 2017-05-15 ENCOUNTER — Ambulatory Visit (INDEPENDENT_AMBULATORY_CARE_PROVIDER_SITE_OTHER): Payer: 59

## 2017-05-15 ENCOUNTER — Ambulatory Visit (HOSPITAL_COMMUNITY)
Admission: EM | Admit: 2017-05-15 | Discharge: 2017-05-15 | Disposition: A | Discharge status: Home / Self Care | Payer: 59 | Attending: Family Medicine | Admitting: Family Medicine

## 2017-05-15 DIAGNOSIS — S6992XA Unspecified injury of left wrist, hand and finger(s), initial encounter: Secondary | ICD-10-CM | POA: Diagnosis not present

## 2017-05-15 DIAGNOSIS — S8992XA Unspecified injury of left lower leg, initial encounter: Secondary | ICD-10-CM | POA: Diagnosis not present

## 2017-05-15 DIAGNOSIS — M25562 Pain in left knee: Secondary | ICD-10-CM | POA: Diagnosis not present

## 2017-05-15 DIAGNOSIS — M79642 Pain in left hand: Secondary | ICD-10-CM | POA: Diagnosis not present

## 2017-05-15 DIAGNOSIS — S8002XA Contusion of left knee, initial encounter: Secondary | ICD-10-CM | POA: Diagnosis not present

## 2017-05-15 DIAGNOSIS — S60222A Contusion of left hand, initial encounter: Secondary | ICD-10-CM

## 2017-05-15 DIAGNOSIS — W000XXA Fall on same level due to ice and snow, initial encounter: Secondary | ICD-10-CM

## 2017-05-15 DIAGNOSIS — M25532 Pain in left wrist: Secondary | ICD-10-CM | POA: Diagnosis not present

## 2017-05-15 DIAGNOSIS — M25512 Pain in left shoulder: Secondary | ICD-10-CM | POA: Diagnosis not present

## 2017-05-15 DIAGNOSIS — S46912A Strain of unspecified muscle, fascia and tendon at shoulder and upper arm level, left arm, initial encounter: Secondary | ICD-10-CM | POA: Diagnosis not present

## 2017-05-15 DIAGNOSIS — S4992XA Unspecified injury of left shoulder and upper arm, initial encounter: Secondary | ICD-10-CM | POA: Diagnosis not present

## 2017-05-15 NOTE — ED Provider Notes (Signed)
[facility]    CSN: [id number] Arrival date & time: 05/15/17  [place]     History   Chief Complaint Chief Complaint  Patient presents with  . Fall    HPI [name] is a 46 y.o. female.   HPI  Past Medical History:  Diagnosis Date  . Allergy   . Anemia   . Anxiety    claustrophobic  . Asthma   . Diabetes mellitus 2011   did not start metforfin, losing weight  . Headache disorder   . Hypertension   . Shoulder impingement syndrome, right     Patient Active Problem List   Diagnosis Date Noted  . Adverse reaction to vaccine, sequela 03/25/2017  . Chest pain 02/27/2017  . Palpitations 02/27/2017  . PAC (premature atrial contraction) 02/27/2017  . PVC's (premature ventricular contractions) 02/27/2017  . Paroxysmal supraventricular tachycardia ([place]) 11/26/2016  . History of palpitations 11/08/2016  . Nontraumatic incomplete tear of right rotator cuff 07/14/2016  . AC (acromioclavicular) arthritis 07/14/2016  . Impingement syndrome of right shoulder 07/14/2016  . Fibrocystic breast changes, right 05/21/2016  . Menopausal symptoms 10/30/2015  . Insomnia 10/16/2015  . Snoring 07/26/2015  . Migraine without aura and without status migrainosus, not intractable 02/24/2015  . Muscle spasm 02/24/2015  . Facial paresthesia 02/24/2015  . Concussion with loss of consciousness 07/21/2014  . Hyperlipidemia associated with type 2 diabetes mellitus ([place]) 06/13/2014  . Pain in joint, shoulder region 08/11/2013  . Vitamin D deficiency 05/07/2013  . Chronic migraine 11/20/2012  . S/P Total Abdominal Hysterectomy and Left Salpingo-oophorectomy 10/20/2011  . Headache disorder   . Uncontrolled type 2 diabetes mellitus ([place]) 07/20/2011  . Obesity (BMI 30-39.9) 07/20/2011  . Hypertension goal BP (blood pressure) < 130/80 07/20/2011    Past Surgical History:  Procedure Laterality Date  . ABDOMINAL HYSTERECTOMY  2006   heavy menses, endometriosis, l oophrectomy  .  BACK SURGERY    . BREAST BIOPSY Right 2018   benign  . BREAST EXCISIONAL BIOPSY Right 2003   benign  . BREAST EXCISIONAL BIOPSY Right 1999   benign  . BREAST SURGERY     right breast x 2 , benign  . HERNIA REPAIR  2003   left inguinal   . LEFT OOPHORECTOMY    . SHOULDER ARTHROSCOPY WITH SUBACROMIAL DECOMPRESSION AND OPEN ROTATOR C Right 07/14/2016   Procedure: RIGHT SHOULDER ARTHROSCOPY WITH SUBACROMIAL DECOMPRESSION, DISTAL CLAVICLE RESECTION AND MINI OPEN ROTATOR CUFF REPAIR, OPEN BICEP TENDODESIS;  Surgeon: Garald Balding, MD;  Location: [facility];  Service: Orthopedics;  Laterality: Right;  . SHOULDER CLOSED REDUCTION Right 09/08/2016   Procedure: RIGHT CLOSED MANIPULATION SHOULDER;  Surgeon: Garald Balding, MD;  Location: [facility];  Service: Orthopedics;  Laterality: Right;  . TUBAL LIGATION      OB History    Gravida Para Term Preterm AB Living   3 2 2  0 1 0   SAB TAB Ectopic Multiple Live Births   0 0 1 0 0       Home Medications    Prior to Admission medications   Medication Sig Start Date End Date Taking? Authorizing Provider  albuterol (PROVENTIL HFA;VENTOLIN HFA) 108 (90 BASE) MCG/ACT inhaler Inhale 2 puffs into the lungs every 6 (six) hours as needed for wheezing or shortness of breath. One puff as needed 01/19/15   Crecencio Mc, MD  aspirin EC 81 MG tablet Take 81 mg by mouth daily.  [provider]  atorvastatin (LIPITOR) 20 MG tablet Take 1 tablet (20 mg total) by mouth daily. 05/23/16   Crecencio Mc, MD  diazepam (VALIUM) 10 MG tablet Take 1 tablet (10 mg total) by mouth every 12 (twelve) hours as needed (muscle spasm). 11/24/16   Crecencio Mc, MD  diltiazem (CARDIZEM LA) 240 MG 24 hr tablet TAKE 1 TABLET BY MOUTH DAILY 03/27/17   Crecencio Mc, MD  Dulaglutide (TRULICITY) 1.5 SH/7.47YO SOPN Inject 1.5 mg into the skin once a week. 04/26/17   Crecencio Mc, MD  EPINEPHrine 0.3 mg/0.3 mL IJ SOAJ injection  Inject 0.3 mg into the muscle as needed (for allergic reaction).  07/22/16   [provider]  Galcanezumab-gnlm (EMGALITY) 120 MG/ML SOAJ Inject 240 mg into the skin as directed AND 120 mg every 30 (thirty) days. Inj 240mg  once then 120mg  monthly. 05/05/17   Jaclyn Prime, Collene Leyden, PA-C  ketorolac (TORADOL) 10 MG tablet Take 1 tablet (10 mg total) by mouth every 6 (six) hours as needed. 02/16/16   Anyanwu, Sallyanne Havers, MD  losartan-hydrochlorothiazide (HYZAAR) 50-12.5 MG tablet Take 1 tablet by mouth daily. 08/17/16   Crecencio Mc, MD  meclizine (ANTIVERT) 25 MG tablet Take 25 mg by mouth 3 (three) times daily as needed for dizziness.    [provider]  methocarbamol (ROBAXIN) 500 MG tablet Take 1 tablet (500 mg total) by mouth every 8 (eight) hours as needed for muscle spasms. 08/24/16   Cherylann Ratel, PA-C  metoprolol tartrate (LOPRESSOR) 25 MG tablet Take 0.5 tablets (12.5 mg total) by mouth daily. 02/07/17 05/08/17  End, Harrell Gave, MD  ondansetron (ZOFRAN) 4 MG tablet Take 1 tablet (4 mg total) by mouth every 8 (eight) hours as needed for nausea or vomiting. 10/04/15   Lisa Roca, MD  oxyCODONE-acetaminophen (ROXICET) 5-325 MG tablet Take 1-2 tablets by mouth every 4 (four) hours as needed for severe pain. 09/08/16   Cherylann Ratel, PA-C  UNIFINE PENTIPS 31G X 5 MM MISC USE AS DIRECTED WITH VICTOZA 02/14/17   Burnard Hawthorne, FNP    Family History Family History  Problem Relation Age of Onset  . Diabetes Mother   . Heart disease Mother 66  . Hypertension Mother   . Hyperlipidemia Mother   . Heart failure Mother   . Cancer Father 75       Lung Cancer  . Heart disease Father   . Mental illness Sister        bipolar, substance abuse,  clean 2 yrs  . Hypertension Sister   . Cancer Paternal Grandmother 80       breast cancer  . Breast cancer Paternal Grandmother   . Diabetes Son     Social History Social History   Tobacco Use  . Smoking status: Never Smoker    . Smokeless tobacco: Never Used  Substance Use Topics  . Alcohol use: No  . Drug use: No     Allergies   Botox [onabotulinumtoxina]; Clindamycin/lincomycin; Kiwi extract; Maxalt [rizatriptan benzoate]; Nitrous oxide; Triptans; Aspirin; Flagyl [metronidazole]; Hydrocodone-acetaminophen; Latex; Mango flavor; Rizatriptan; Tetracycline; Vicodin [hydrocodone-acetaminophen]; Flu virus vaccine; Penicillins; and Tetracyclines & related   Review of Systems Review of Systems   Physical Exam Triage Vital Signs ED Triage Vitals  Enc Vitals Group     BP 05/15/17 1804 127/83     Pulse Rate 05/15/17 1804 77     Resp 05/15/17 1804 19     Temp 05/15/17 1804 98  F (36.7 C)     Temp Source 05/15/17 1804 Oral     SpO2 05/15/17 1804 100 %     Weight --      Height --      Head Circumference --      Peak Flow --      Pain Score 05/15/17 1802 7     Pain Loc --      Pain Edu? --      Excl. in [place]? --    No data found.  Updated Vital Signs BP 127/83 (BP Location: Right Leg)   Pulse 77   Temp 98 F (36.7 C) (Oral)   Resp 19   SpO2 100%   Visual Acuity Right Eye Distance:   Left Eye Distance:   Bilateral Distance:    Right Eye Near:   Left Eye Near:    Bilateral Near:     Physical Exam   UC Treatments / Results  Labs (all labs ordered are listed, but only abnormal results are displayed) Labs Reviewed - No data to display  EKG  EKG Interpretation None       Radiology No results found.  Procedures Procedures (including critical care time)  Medications Ordered in UC Medications - No data to display   Initial Impression / Assessment and Plan / UC Course  I have reviewed the triage vital signs and the nursing notes.  Pertinent labs & imaging results that were available during my care of the patient were reviewed by me and considered in my medical decision making (see chart for details).      Final Clinical Impressions(s) / UC Diagnoses   Final diagnoses:   None    ED Discharge Orders    None       Controlled Substance Prescriptions [place] Controlled Substance Registry consulted? Not Applicable   Robyn Haber, MD 05/23/17 [phone]

## 2017-05-15 NOTE — ED Triage Notes (Signed)
Per pt she fell down at home today about 2 hours ago, per pt her left side and her wrist has most of the pain, per pt she hurts all over, per pt she fell due to ice on ground

## 2017-05-15 NOTE — ED Provider Notes (Signed)
[facility]    CSN: [id number] Arrival date & time: 05/15/17  [place]     History   Chief Complaint Chief Complaint  Patient presents with  . Fall    HPI [name] is a 46 y.o. female presenting a few hours after a fall with left knee pain, left wrist and left shoulder pain. Patient was walking and slipped on the ice/snow on sidewalk, left leg slipped and twisted, she fell forward with her arm outstretched, Hit head on a car, no LOC. Pain with movement, relieved with rest. Knee- no popping or tearing on fall, pain with weight bearing.    HPI  Past Medical History:  Diagnosis Date  . Allergy   . Anemia   . Anxiety    claustrophobic  . Asthma   . Diabetes mellitus 2011   did not start metforfin, losing weight  . Headache disorder   . Hypertension   . Shoulder impingement syndrome, right     Patient Active Problem List   Diagnosis Date Noted  . Adverse reaction to vaccine, sequela 03/25/2017  . Chest pain 02/27/2017  . Palpitations 02/27/2017  . PAC (premature atrial contraction) 02/27/2017  . PVC's (premature ventricular contractions) 02/27/2017  . Paroxysmal supraventricular tachycardia ([place]) 11/26/2016  . History of palpitations 11/08/2016  . Nontraumatic incomplete tear of right rotator cuff 07/14/2016  . AC (acromioclavicular) arthritis 07/14/2016  . Impingement syndrome of right shoulder 07/14/2016  . Fibrocystic breast changes, right 05/21/2016  . Menopausal symptoms 10/30/2015  . Insomnia 10/16/2015  . Snoring 07/26/2015  . Migraine without aura and without status migrainosus, not intractable 02/24/2015  . Muscle spasm 02/24/2015  . Facial paresthesia 02/24/2015  . Concussion with loss of consciousness 07/21/2014  . Hyperlipidemia associated with type 2 diabetes mellitus ([place]) 06/13/2014  . Pain in joint, shoulder region 08/11/2013  . Vitamin D deficiency 05/07/2013  . Chronic migraine 11/20/2012  . S/P Total Abdominal Hysterectomy and  Left Salpingo-oophorectomy 10/20/2011  . Headache disorder   . Uncontrolled type 2 diabetes mellitus ([place]) 07/20/2011  . Obesity (BMI 30-39.9) 07/20/2011  . Hypertension goal BP (blood pressure) < 130/80 07/20/2011    Past Surgical History:  Procedure Laterality Date  . ABDOMINAL HYSTERECTOMY  2006   heavy menses, endometriosis, l oophrectomy  . BACK SURGERY    . BREAST BIOPSY Right 2018   benign  . BREAST EXCISIONAL BIOPSY Right 2003   benign  . BREAST EXCISIONAL BIOPSY Right 1999   benign  . BREAST SURGERY     right breast x 2 , benign  . HERNIA REPAIR  2003   left inguinal   . LEFT OOPHORECTOMY    . SHOULDER ARTHROSCOPY WITH SUBACROMIAL DECOMPRESSION AND OPEN ROTATOR C Right 07/14/2016   Procedure: RIGHT SHOULDER ARTHROSCOPY WITH SUBACROMIAL DECOMPRESSION, DISTAL CLAVICLE RESECTION AND MINI OPEN ROTATOR CUFF REPAIR, OPEN BICEP TENDODESIS;  Surgeon: Garald Balding, MD;  Location: [facility];  Service: Orthopedics;  Laterality: Right;  . SHOULDER CLOSED REDUCTION Right 09/08/2016   Procedure: RIGHT CLOSED MANIPULATION SHOULDER;  Surgeon: Garald Balding, MD;  Location: [facility];  Service: Orthopedics;  Laterality: Right;  . TUBAL LIGATION      OB History    Gravida Para Term Preterm AB Living   3 2 2  0 1 0   SAB TAB Ectopic Multiple Live Births   0 0 1 0 0       Home Medications    Prior to Admission medications  Medication Sig Start Date End Date Taking? Authorizing Provider  albuterol (PROVENTIL HFA;VENTOLIN HFA) 108 (90 BASE) MCG/ACT inhaler Inhale 2 puffs into the lungs every 6 (six) hours as needed for wheezing or shortness of breath. One puff as needed 01/19/15   Crecencio Mc, MD  aspirin EC 81 MG tablet Take 81 mg by mouth daily.    [provider]  atorvastatin (LIPITOR) 20 MG tablet Take 1 tablet (20 mg total) by mouth daily. 05/23/16   Crecencio Mc, MD  diazepam (VALIUM) 10 MG tablet Take 1 tablet (10 mg total)  by mouth every 12 (twelve) hours as needed (muscle spasm). 11/24/16   Crecencio Mc, MD  diltiazem (CARDIZEM LA) 240 MG 24 hr tablet TAKE 1 TABLET BY MOUTH DAILY 03/27/17   Crecencio Mc, MD  Dulaglutide (TRULICITY) 1.5 HY/0.7PX SOPN Inject 1.5 mg into the skin once a week. 04/26/17   Crecencio Mc, MD  EPINEPHrine 0.3 mg/0.3 mL IJ SOAJ injection Inject 0.3 mg into the muscle as needed (for allergic reaction).  07/22/16   [provider]  Galcanezumab-gnlm (EMGALITY) 120 MG/ML SOAJ Inject 240 mg into the skin as directed AND 120 mg every 30 (thirty) days. Inj 240mg  once then 120mg  monthly. 05/05/17   Jaclyn Prime, Collene Leyden, PA-C  ketorolac (TORADOL) 10 MG tablet Take 1 tablet (10 mg total) by mouth every 6 (six) hours as needed. 02/16/16   Anyanwu, Sallyanne Havers, MD  losartan-hydrochlorothiazide (HYZAAR) 50-12.5 MG tablet Take 1 tablet by mouth daily. 08/17/16   Crecencio Mc, MD  meclizine (ANTIVERT) 25 MG tablet Take 25 mg by mouth 3 (three) times daily as needed for dizziness.    [provider]  methocarbamol (ROBAXIN) 500 MG tablet Take 1 tablet (500 mg total) by mouth every 8 (eight) hours as needed for muscle spasms. 08/24/16   Cherylann Ratel, PA-C  metoprolol tartrate (LOPRESSOR) 25 MG tablet Take 0.5 tablets (12.5 mg total) by mouth daily. 02/07/17 05/08/17  End, Harrell Gave, MD  ondansetron (ZOFRAN) 4 MG tablet Take 1 tablet (4 mg total) by mouth every 8 (eight) hours as needed for nausea or vomiting. 10/04/15   Lisa Roca, MD  oxyCODONE-acetaminophen (ROXICET) 5-325 MG tablet Take 1-2 tablets by mouth every 4 (four) hours as needed for severe pain. 09/08/16   Cherylann Ratel, PA-C  UNIFINE PENTIPS 31G X 5 MM MISC USE AS DIRECTED WITH VICTOZA 02/14/17   Burnard Hawthorne, FNP    Family History Family History  Problem Relation Age of Onset  . Diabetes Mother   . Heart disease Mother 23  . Hypertension Mother   . Hyperlipidemia Mother   . Heart failure Mother   .  Cancer Father 33       Lung Cancer  . Heart disease Father   . Mental illness Sister        bipolar, substance abuse,  clean 2 yrs  . Hypertension Sister   . Cancer Paternal Grandmother 43       breast cancer  . Breast cancer Paternal Grandmother   . Diabetes Son     Social History Social History   Tobacco Use  . Smoking status: Never Smoker  . Smokeless tobacco: Never Used  Substance Use Topics  . Alcohol use: No  . Drug use: No     Allergies   Botox [onabotulinumtoxina]; Clindamycin/lincomycin; Kiwi extract; Maxalt [rizatriptan benzoate]; Nitrous oxide; Triptans; Aspirin; Flagyl [metronidazole]; Hydrocodone-acetaminophen; Latex; Mango flavor; Rizatriptan; Tetracycline; Vicodin [hydrocodone-acetaminophen]; Flu  virus vaccine; Penicillins; and Tetracyclines & related   Review of Systems Review of Systems  Constitutional: Negative for chills and fever.  HENT: Negative for ear pain and sore throat.   Eyes: Negative for pain and visual disturbance.  Respiratory: Negative for cough and shortness of breath.   Cardiovascular: Negative for chest pain and palpitations.  Gastrointestinal: Negative for abdominal pain and vomiting.  Genitourinary: Negative for dysuria and hematuria.  Musculoskeletal: Positive for arthralgias. Negative for back pain.       Left hand pain, left shoulder pain  Skin: Negative for color change and rash.  Neurological: Negative for dizziness, syncope, speech difficulty, light-headedness, numbness and headaches.  All other systems reviewed and are negative.    Physical Exam Triage Vital Signs ED Triage Vitals  Enc Vitals Group     BP 05/15/17 1804 127/83     Pulse Rate 05/15/17 1804 77     Resp 05/15/17 1804 19     Temp 05/15/17 1804 98 F (36.7 C)     Temp Source 05/15/17 1804 Oral     SpO2 05/15/17 1804 100 %     Weight --      Height --      Head Circumference --      Peak Flow --      Pain Score 05/15/17 1802 7     Pain Loc --      Pain  Edu? --      Excl. in [place]? --    No data found.  Updated Vital Signs BP 127/83 (BP Location: Right Leg)   Pulse 77   Temp 98 F (36.7 C) (Oral)   Resp 19   SpO2 100%    Physical Exam  Constitutional: She is oriented to person, place, and time. She appears well-developed and well-nourished. No distress.  HENT:  Head: Normocephalic and atraumatic.  Eyes: Conjunctivae and EOM are normal. Pupils are equal, round, and reactive to light.  Neck: Normal range of motion. Neck supple.  Cardiovascular: Normal rate and regular rhythm.  No murmur heard. Pulmonary/Chest: Effort normal and breath sounds normal. No respiratory distress.  Abdominal: Soft. There is no tenderness.  Musculoskeletal: She exhibits tenderness. She exhibits no edema or deformity.  Patient able to ambulate from wheelchair onto exam table, with pain but minimal assistance needed.  Left shoulder: Mild tenderness to palpation of AC joint, no tenderness to clavicle. Active ROM limited due to pain with forward flexion past 90, abduction limited past 90, overhead movements causing most pain.  Left wrist/hand: minimal swelling, full flexion and extension of wrist, mild tenderness to palpation of left 5th MCP. Full ROM of DIP and PIP. Radial pulse 2 +.  Left knee: minimal swelling, no erythema. Moderate tenderness to palpation along medial aspect. Negative anterior drawer, negative posterior drawer, negative lachmann, negative mc murray.   Left ankle/foot-no effusion noted, full plantar and dorsi flexion, no tenderness to palpation of tarsals and metatarsals, medial and lateral malleolus. Dorsalis pedis 2+, capillary refill not tested due to nail polish, toes warm.  Neurological: She is alert and oriented to person, place, and time. No cranial nerve deficit or sensory deficit. Coordination normal.  Skin: Skin is warm and dry.  Psychiatric: She has a normal mood and affect.  Nursing note and vitals reviewed.    UC Treatments /  Results  Labs (all labs ordered are listed, but only abnormal results are displayed) Labs Reviewed - No data to display  EKG  EKG Interpretation None  Radiology No results found.  Procedures Procedures (including critical care time)  Medications Ordered in UC Medications - No data to display   Initial Impression / Assessment and Plan / UC Course  I have reviewed the triage vital signs and the nursing notes.  Pertinent labs & imaging results that were available during my care of the patient were reviewed by me and considered in my medical decision making (see chart for details).    No Fractures on Xray, likely a knee, and hand contusion and shoulder strain. Pain may increase over the next 24-48 hours as swelling reaches its maximum. I expect your pain to slowly resolve over the next 2 weeks.   Please use over the counter anti-inflammatories for pain- Ibuprofen 600-800 mg every 8 hours, with food. May supplement with Tylenol [phone] mg every 8 hours as well.   Alternate Ice and Heat for swelling and discomfort. Weight bear as tolerated.   Please return if symptoms fail to improve in 1-2 weeks. Return earlier if experiencing numbness, loss of sensation, significant swelling.  Offered Ibuprofen, wanted to wait until at home as she has not eaten.  Final Clinical Impressions(s) / UC Diagnoses   Final diagnoses:  None    ED Discharge Orders    None       Controlled Substance Prescriptions [place] Controlled Substance Registry consulted? Not Applicable   Janith Lima, [place] 05/15/17 2010

## 2017-05-15 NOTE — Discharge Instructions (Addendum)
°  Pain may increase over the next 24-48 hours as swelling reaches its maximum. I expect your pain to slowly resolve over the next 2 weeks.   Please use over the counter anti-inflammatories for pain- Ibuprofen 600-800 mg every 8 hours, with food. May supplement with Tylenol [phone] mg every 8 hours as well.   Alternate Ice and Heat for swelling and discomfort. Weight bear as tolerated.   Please return if symptoms fail to improve in 1-2 weeks. Return earlier if experiencing numbness, loss of sensation, significant swelling.

## 2017-05-23 MED FILL — TRULICITY 1.5 MG/0.5 ML PEN: 1.5 | 28 days supply | Qty: 2 | Fill #1

## 2017-06-02 MED FILL — LOSARTAN-HCTZ 50-12.5 MG TA: 50-12.5 | 90 days supply | Qty: 90 | Fill #2

## 2017-06-02 MED FILL — DILTIAZEM ER 240 MG TABLET: 240 | 30 days supply | Qty: 30 | Fill #2

## 2017-06-07 ENCOUNTER — Ambulatory Visit (INDEPENDENT_AMBULATORY_CARE_PROVIDER_SITE_OTHER): Payer: 59 | Admitting: Internal Medicine

## 2017-06-07 ENCOUNTER — Encounter: Payer: Self-pay | Admitting: Internal Medicine

## 2017-06-07 VITALS — BP 112/72 | HR 73 | Temp 98.0°F | Resp 14 | Ht 66.0 in | Wt 221.2 lb

## 2017-06-07 DIAGNOSIS — E1165 Type 2 diabetes mellitus with hyperglycemia: Secondary | ICD-10-CM | POA: Diagnosis not present

## 2017-06-07 DIAGNOSIS — N951 Menopausal and female climacteric states: Secondary | ICD-10-CM | POA: Diagnosis not present

## 2017-06-07 MED ORDER — SPIRONOLACTONE 25 MG PO TABS: 25.0000 mg | ORAL_TABLET | Freq: Every day | ORAL | 3 refills | Status: DC

## 2017-06-07 MED ORDER — ESCITALOPRAM OXALATE 5 MG PO TABS: 5.0000 mg | ORAL_TABLET | Freq: Every day | ORAL | 1 refills | Status: DC

## 2017-06-07 MED FILL — ESCITALOPRAM 5 MG TABLET: 5 | 30 days supply | Qty: 30 | Fill #0

## 2017-06-07 MED FILL — SPIRONOLACTONE 25 MG TABLET: 25 | 30 days supply | Qty: 30 | Fill #0

## 2017-06-07 NOTE — Progress Notes (Signed)
r  Subjective:  Patient ID: [name], female    DOB: 1970/09/13  Age: 47 y.o. MRN: [id number]  CC: The primary encounter diagnosis was Uncontrolled type 2 diabetes mellitus with hyperglycemia ([place]). A diagnosis of Menopause syndrome was also pertinent to this visit.  HPI [name] presents for 3 month follow up on uncontrolled  diabetes.   She was asked to return in 6 weeks with log of her blood sugars ,  But has been following up with the  [facility] program instead.  She reports that her fasting blood sugars remain elevated but her post  Sugars have improved are are mostly < 160  She had an ER visit for evaluation of multiple contusions to  Left knee, left shoulder and left hand after a  fall onto her left side that occurred during the snow.   Radiographs were taken and no fractures were seen.        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  She reinjured her right shoulder.  Seeing Durward Fortes on Jan 14th. Worker's comp gave her sling.  Has been having increased hot flashes frequently at night resulting in full body sweats.     Lab Results  Component Value Date   HGBA1C 8.3 (H) 03/22/2017     Outpatient Medications Prior to Visit  Medication Sig Dispense Refill  . albuterol (PROVENTIL HFA;VENTOLIN HFA) 108 (90 BASE) MCG/ACT inhaler Inhale 2 puffs into the lungs every 6 (six) hours as needed for wheezing or shortness of breath. One puff as needed 3.7 g 1  . aspirin EC 81 MG tablet Take 81 mg by mouth daily.    [name] atorvastatin (LIPITOR) 20 MG tablet Take 1 tablet (20 mg total) by mouth daily. 90 tablet 3  . diazepam (VALIUM) 10 MG tablet Take 1 tablet (10 mg total) by mouth every 12 (twelve) hours as needed (muscle spasm). 30 tablet 3  . diltiazem (CARDIZEM LA) 240 MG 24 hr tablet TAKE 1 TABLET BY MOUTH DAILY 30 tablet 3  . Dulaglutide (TRULICITY) 1.5 UD/1.4HF SOPN Inject 1.5 mg into the skin once a week. 4 pen 11    . EPINEPHrine 0.3 mg/0.3 mL IJ SOAJ injection Inject 0.3 mg into the muscle as needed (for allergic reaction).   3  . Galcanezumab-gnlm (EMGALITY) 120 MG/ML SOAJ Inject 240 mg into the skin as directed AND 120 mg every 30 (thirty) days. Inj 240mg  once then 120mg  monthly. 1 pen 11  . ketorolac (TORADOL) 10 MG tablet Take 1 tablet (10 mg total) by mouth every 6 (six) hours as needed. 30 tablet 1  . losartan-hydrochlorothiazide (HYZAAR) 50-12.5 MG tablet Take 1 tablet by mouth daily. 90 tablet 3  . meclizine (ANTIVERT) 25 MG tablet Take 25 mg by mouth 3 (three) times daily as needed for dizziness.    . methocarbamol (ROBAXIN) 500 MG tablet Take 1 tablet (500 mg total) by mouth every 8 (eight) hours as needed for muscle spasms. 40 tablet 0  . ondansetron (ZOFRAN) 4 MG tablet Take 1 tablet (4 mg total) by mouth every 8 (eight) hours as needed for nausea or vomiting. 10 tablet 0  . oxyCODONE-acetaminophen (ROXICET) 5-325 MG tablet Take 1-2 tablets by mouth every 4 (four) hours as needed for severe pain. 40 tablet 0  . UNIFINE PENTIPS 31G X 5 MM MISC USE AS DIRECTED WITH VICTOZA 100 each 3  . metoprolol tartrate (LOPRESSOR) 25 MG tablet  Take 0.5 tablets (12.5 mg total) by mouth daily. 45 tablet 3   No facility-administered medications prior to visit.     Review of Systems;  Patient denies headache, fevers, malaise, unintentional weight loss, skin rash, eye pain, sinus congestion and sinus pain, sore throat, dysphagia,  hemoptysis , cough, dyspnea, wheezing, chest pain, palpitations, orthopnea, edema, abdominal pain, nausea, melena, diarrhea, constipation, flank pain, dysuria, hematuria, urinary  Frequency, nocturia, numbness, tingling, seizures,  Focal weakness, Loss of consciousness,  Tremor, insomnia, depression, anxiety, and suicidal ideation.      Objective:  BP 112/72 (BP Location: Left Arm, Patient Position: Sitting, Cuff Size: Large)   Pulse 73   Temp 98 F (36.7 C) (Oral)   Resp 14   Ht  5\' 6"  (1.676 m)   Wt 221 lb 3.2 oz (100.3 kg)   SpO2 97%   BMI 35.70 kg/m   BP Readings from Last 3 Encounters:  06/09/17 122/84  06/07/17 112/72  05/15/17 127/83    Wt Readings from Last 3 Encounters:  06/09/17 222 lb 1.9 oz (100.8 kg)  06/07/17 221 lb 3.2 oz (100.3 kg)  05/05/17 221 lb (100.2 kg)    General appearance: alert, cooperative and appears stated age Ears: normal TM's and external ear canals both ears Throat: lips, mucosa, and tongue normal; teeth and gums normal Neck: no adenopathy, no carotid bruit, supple, symmetrical, trachea midline and thyroid not enlarged, symmetric, no tenderness/mass/nodules Back: symmetric, no curvature. ROM normal. No CVA tenderness. Lungs: clear to auscultation bilaterally Heart: regular rate and rhythm, S1, S2 normal, no murmur, click, rub or gallop Abdomen: soft, non-tender; bowel sounds normal; no masses,  no organomegaly Pulses: 2+ and symmetric Skin: Skin color, texture, turgor normal. No rashes or lesions Lymph nodes: Cervical, supraclavicular, and axillary nodes normal.  Lab Results  Component Value Date   HGBA1C 8.3 (H) 03/22/2017   HGBA1C 7.0 (H) 11/18/2016   HGBA1C 10.0 (H) 08/17/2016    Lab Results  Component Value Date   CREATININE 0.75 03/22/2017   CREATININE 0.81 02/24/2017   CREATININE 0.79 11/07/2016    Lab Results  Component Value Date   WBC 10.1 02/24/2017   HGB 13.3 02/24/2017   HCT 40.3 02/24/2017   PLT 224 02/24/2017   GLUCOSE 175 (H) 03/22/2017   CHOL 146 03/22/2017   TRIG 111.0 03/22/2017   HDL 38.40 (L) 03/22/2017   LDLDIRECT 98.0 08/17/2016   LDLCALC 85 03/22/2017   ALT 22 03/22/2017   AST 18 03/22/2017   NA 137 03/22/2017   K 3.6 03/22/2017   CL 105 03/22/2017   CREATININE 0.75 03/22/2017   BUN 15 03/22/2017   CO2 24 03/22/2017   TSH 1.06 11/07/2016   HGBA1C 8.3 (H) 03/22/2017   MICROALBUR 2.4 (H) 05/18/2016    Dg Shoulder Left  Result Date: 05/15/2017 CLINICAL DATA:  Persistent  left upper extremity pain after falling today. EXAM: LEFT SHOULDER - 2+ VIEW COMPARISON:  None. FINDINGS: There is no evidence of fracture or dislocation. There is no evidence of arthropathy or other focal bone abnormality. Soft tissues are unremarkable. IMPRESSION: Negative. Electronically Signed   By: Andreas Newport M.D.   On: 05/15/2017 19:55   Dg Hand Complete Left  Result Date: 05/15/2017 CLINICAL DATA:  Persistent left upper extremity pain after falling today. EXAM: LEFT HAND - COMPLETE 3+ VIEW COMPARISON:  None. FINDINGS: There is no evidence of fracture or dislocation. There is no evidence of arthropathy or other focal bone abnormality. Soft tissues are unremarkable.  IMPRESSION: Negative. Electronically Signed   By: Andreas Newport M.D.   On: 05/15/2017 19:56   Dg Knee Ap/lat W/sunrise Left  Result Date: 05/15/2017 CLINICAL DATA:  Persistent left lower extremity pain after falling today. EXAM: LEFT KNEE 3 VIEWS COMPARISON:  None. FINDINGS: No evidence of fracture, dislocation, or joint effusion. No evidence of arthropathy or other focal bone abnormality. Soft tissues are unremarkable. IMPRESSION: Negative. Electronically Signed   By: Andreas Newport M.D.   On: 05/15/2017 19:56    Assessment & Plan:   Problem List Items Addressed This Visit    Menopause syndrome    Trial of lexapro for flushing      Uncontrolled type 2 diabetes mellitus ([place]) - Primary    improved BS per patient, but morning readings are still elevated. Advised to check a 3 am sugar once or twice to ensure hypoglycemia is not occurring.  A1c is due on or after Jan 17   Lab Results  Component Value Date   HGBA1C 8.3 (H) 03/22/2017   Lab Results  Component Value Date   MICROALBUR 2.4 (H) 05/18/2016         Relevant Orders   Hemoglobin A1c   Microalbumin / creatinine urine ratio   Comprehensive metabolic panel   Lipid panel     A total of 25 minutes of face to face time was spent with patient more  than half of which was spent in counselling about the above mentioned conditions  and coordination of care  I am having Larayah N. Avetisyan start on escitalopram and spironolactone. I am also having her maintain her meclizine, albuterol, ondansetron, ketorolac, atorvastatin, EPINEPHrine, losartan-hydrochlorothiazide, methocarbamol, aspirin EC, oxyCODONE-acetaminophen, diazepam, metoprolol tartrate, UNIFINE PENTIPS, diltiazem, Dulaglutide, and Galcanezumab-gnlm.  Meds ordered this encounter  Medications  . escitalopram (LEXAPRO) 5 MG tablet    Sig: Take 1 tablet (5 mg total) by mouth daily.    Dispense:  30 tablet    Refill:  1  . spironolactone (ALDACTONE) 25 MG tablet    Sig: Take 1 tablet (25 mg total) by mouth daily. As needed for fluid retention    Dispense:  30 tablet    Refill:  3    There are no discontinued medications.  Follow-up: Return for labs after Jan 17th  follow  up  late april .   Crecencio Mc, MD

## 2017-06-07 NOTE — Patient Instructions (Addendum)
Return for fasting labs after Jan 17th    Trial of Lexapro 5 mg daily with dinner for hot flahses   Check a 3 a m blood sugar to rule out hypoglycemia  If your 3 am sugar is > 130  We will add 2.5 mg glipizide with dinner  spironolactone for fluid retention ( as needed )   we can discuss trial of belviq  If still not losing weight in one month

## 2017-06-09 ENCOUNTER — Encounter: Payer: Self-pay | Admitting: Internal Medicine

## 2017-06-09 ENCOUNTER — Ambulatory Visit: Payer: 59 | Admitting: Internal Medicine

## 2017-06-09 VITALS — BP 122/84 | HR 92 | Ht 66.0 in | Wt 222.1 lb

## 2017-06-09 DIAGNOSIS — I471 Supraventricular tachycardia: Secondary | ICD-10-CM

## 2017-06-09 DIAGNOSIS — R0789 Other chest pain: Secondary | ICD-10-CM

## 2017-06-09 DIAGNOSIS — I10 Essential (primary) hypertension: Secondary | ICD-10-CM | POA: Diagnosis not present

## 2017-06-09 MED FILL — EMGALITY 120 MG/ML SOAJ: 120 | 30 days supply | Qty: 1 | Fill #0

## 2017-06-09 NOTE — Progress Notes (Signed)
Follow-up Outpatient Visit Date: 06/09/2017  Primary Care Provider: Crecencio Mc, MD [place] [address]  Chief Complaint: Follow-up SVT and chest pain  HPI:  [name] is a 47 y.o. year-old female with history of HTN, DM, asthma, anxiety, and multiple allergies, who presents for follow-up of chest pain and palpitations. I last saw her in September, which time she reported an episode of chest pain, shortness of breath, and coryza after receiving the flu vaccine. Subsequent coronary CTA was normal.  Since her last visit, [name] has not had any further chest pain.  Palpitations of also been well controlled with only 2 brief episodes over the last few months.  She is tolerating metoprolol well.  Her weight has been up and down over the last few weeks.  She has also noted intermittent swelling of her upper and lower extremities.  She wonders if this could be related to diltiazem, though this has been a long-standing medication for her.  Blood pressure at home has been well controlled.  She was prescribed spironolactone to be used on an as-needed basis by Dr. Derrel Nip last week, to see if this helps improve her edema.  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  She had undergone rotator cuff surgery last year.  She is scheduled for orthopedics evaluation later this month.  --------------------------------------------------------------------------------------------------  Cardiovascular History & Procedures: Cardiovascular Problems:  Palpitations  Risk Factors:  Hypertension, diabetes, obesity, and family history  Cath/PCI:  None  CV Surgery:  None  EP Procedures and Devices:  14-day event monitor (01/12/17): Predominantly sinus rhythm with rare PACs and PVCs. Brief atrial run lasting 4 beats.  Non-Invasive Evaluation(s):  Coronary CTA (03/23/17): No evidence of coronary artery disease. Normal coronary  origins with right dominance. Coronary artery calcium score is zero.  TTE (01/19/17): Normal LV size with moderate LVH. LVEF 60-65% with grade 1 diastolic dysfunction. Normal RV size and function. Trivial pericardial effusion.  Recent CV Pertinent Labs: Lab Results  Component Value Date   CHOL 146 03/22/2017   HDL 38.40 (L) 03/22/2017   LDLCALC 85 03/22/2017   LDLDIRECT 98.0 08/17/2016   TRIG 111.0 03/22/2017   CHOLHDL 4 03/22/2017   K 3.6 03/22/2017   K 3.6 06/14/2012   MG 1.9 11/07/2016   BUN 15 03/22/2017   BUN 17 06/14/2012   CREATININE 0.75 03/22/2017   CREATININE 0.83 06/14/2013    Past medical and surgical history were reviewed and updated in EPIC.  Current Meds  Medication Sig  . albuterol (PROVENTIL HFA;VENTOLIN HFA) 108 (90 BASE) MCG/ACT inhaler Inhale 2 puffs into the lungs every 6 (six) hours as needed for wheezing or shortness of breath. One puff as needed  . aspirin EC 81 MG tablet Take 81 mg by mouth daily.  [name] atorvastatin (LIPITOR) 20 MG tablet Take 1 tablet (20 mg total) by mouth daily.  . diazepam (VALIUM) 10 MG tablet Take 1 tablet (10 mg total) by mouth every 12 (twelve) hours as needed (muscle spasm).  [name] diltiazem (CARDIZEM LA) 240 MG 24 hr tablet TAKE 1 TABLET BY MOUTH DAILY  . Dulaglutide (TRULICITY) 1.5 XH/3.7JI SOPN Inject 1.5 mg into the skin once a week.  [name] EPINEPHrine 0.3 mg/0.3 mL IJ SOAJ injection Inject 0.3 mg into the muscle as needed (for allergic reaction).   [name] escitalopram (LEXAPRO) 5 MG tablet Take 1 tablet (5 mg total) by mouth daily.  [name] Galcanezumab-gnlm (EMGALITY) 120 MG/ML  SOAJ Inject 240 mg into the skin as directed AND 120 mg every 30 (thirty) days. Inj 240mg  once then 120mg  monthly.  [name] ketorolac (TORADOL) 10 MG tablet Take 1 tablet (10 mg total) by mouth every 6 (six) hours as needed.  [name] losartan-hydrochlorothiazide (HYZAAR) 50-12.5 MG tablet Take 1 tablet by mouth daily.  . meclizine (ANTIVERT) 25 MG tablet Take 25 mg by mouth 3 (three)  times daily as needed for dizziness.  . methocarbamol (ROBAXIN) 500 MG tablet Take 1 tablet (500 mg total) by mouth every 8 (eight) hours as needed for muscle spasms.  . ondansetron (ZOFRAN) 4 MG tablet Take 1 tablet (4 mg total) by mouth every 8 (eight) hours as needed for nausea or vomiting.  [name] oxyCODONE-acetaminophen (ROXICET) 5-325 MG tablet Take 1-2 tablets by mouth every 4 (four) hours as needed for severe pain.  [name] spironolactone (ALDACTONE) 25 MG tablet Take 1 tablet (25 mg total) by mouth daily. As needed for fluid retention  . UNIFINE PENTIPS 31G X 5 MM MISC USE AS DIRECTED WITH VICTOZA    Allergies: Botox [onabotulinumtoxina]; Clindamycin/lincomycin; Kiwi extract; Maxalt [rizatriptan benzoate]; Nitrous oxide; Triptans; Aspirin; Flagyl [metronidazole]; Hydrocodone-acetaminophen; Latex; Mango flavor; Rizatriptan; Tetracycline; Vicodin [hydrocodone-acetaminophen]; Flu virus vaccine; Penicillins; and Tetracyclines & related  Social History   Socioeconomic History  . Marital status: Divorced    Spouse name: Not on file  . Number of children: Not on file  . Years of education: Not on file  . Highest education level: Not on file  Social Needs  . Financial resource strain: Not on file  . Food insecurity - worry: Not on file  . Food insecurity - inability: Not on file  . Transportation needs - medical: Not on file  . Transportation needs - non-medical: Not on file  Occupational History  . Not on file  Tobacco Use  . Smoking status: Never Smoker  . Smokeless tobacco: Never Used  Substance and Sexual Activity  . Alcohol use: No  . Drug use: No  . Sexual activity: Not Currently    Partners: Male    Birth control/protection: Surgical  Other Topics Concern  . Not on file  Social History Narrative  . Not on file    Family History  Problem Relation Age of Onset  . Diabetes Mother   . Heart disease Mother 1  . Hypertension Mother   . Hyperlipidemia Mother   . Heart failure  Mother   . Cancer Father 87       Lung Cancer  . Heart disease Father   . Mental illness Sister        bipolar, substance abuse,  clean 2 yrs  . Hypertension Sister   . Cancer Paternal Grandmother 7       breast cancer  . Breast cancer Paternal Grandmother   . Diabetes Son     Review of Systems: A 12-system review of systems was performed and was negative except as noted in the HPI.  --------------------------------------------------------------------------------------------------  Physical Exam: BP 122/84   Pulse 92   Ht 5\' 6"  (1.676 m)   Wt 222 lb 1.9 oz (100.8 kg)   BMI 35.85 kg/m   General: Obese woman, seated comfortably in the exam room. HEENT: No conjunctival pallor or scleral icterus. Moist mucous membranes.  OP clear. Neck: Supple without lymphadenopathy, thyromegaly, JVD, or HJR.  Lungs: Normal work of breathing. Clear to auscultation bilaterally without wheezes or crackles. Heart: Regular rate and rhythm without murmurs, rubs, or gallops. Non-displaced PMI.  Abd: Bowel sounds present. Soft, NT/ND without hepatosplenomegaly Ext: No lower extremity edema. Radial, PT, and DP pulses are 2+ bilaterally. Skin: Warm and dry without rash.  Lab Results  Component Value Date   WBC 10.1 02/24/2017   HGB 13.3 02/24/2017   HCT 40.3 02/24/2017   MCV 87.8 02/24/2017   PLT 224 02/24/2017    Lab Results  Component Value Date   NA 137 03/22/2017   K 3.6 03/22/2017   CL 105 03/22/2017   CO2 24 03/22/2017   BUN 15 03/22/2017   CREATININE 0.75 03/22/2017   GLUCOSE 175 (H) 03/22/2017   ALT 22 03/22/2017    Lab Results  Component Value Date   CHOL 146 03/22/2017   HDL 38.40 (L) 03/22/2017   LDLCALC 85 03/22/2017   LDLDIRECT 98.0 08/17/2016   TRIG 111.0 03/22/2017   CHOLHDL 4 03/22/2017    --------------------------------------------------------------------------------------------------  ASSESSMENT AND PLAN: PSVT Minimal symptoms.  Continue metoprolol and  diltiazem.  If edema persists, consider weaning off diltiazem.  Metoprolol may need to be escalated for suppression of supraventricular ectopy.  Hypertension Diastolic blood pressure mildly elevated today.  Continue current medications.  Patient counseled to minimize salt intake.  If edema persists, diltiazem may need to be weaned off.  Further management per Dr. Derrel Nip.  Atypical chest pain No symptoms since our last visit.  Coronary CTA was normal without any coronary artery calcification or stenosis.  No further workup at this time.  Continue primary prevention.  Follow-up: Return to clinic in 1 year.  Nelva Bush, MD 06/10/2017 9:04 PM

## 2017-06-09 NOTE — Patient Instructions (Addendum)
Medication Instructions:  Your physician recommends that you continue on your current medications as directed. Please refer to the Current Medication list given to you today.   Labwork: None   Testing/Procedures: None   Follow-Up: Your physician wants you to follow-up in: 1 year with Dr End. (January 2020). You will receive a reminder letter in the mail two months in advance. If you don't receive a letter, please call our office to schedule the follow-up appointment.        If you need a refill on your cardiac medications before your next appointment, please call your pharmacy.   

## 2017-06-10 ENCOUNTER — Encounter: Payer: Self-pay | Admitting: Internal Medicine

## 2017-06-10 DIAGNOSIS — N951 Menopausal and female climacteric states: Secondary | ICD-10-CM | POA: Insufficient documentation

## 2017-06-10 NOTE — Assessment & Plan Note (Signed)
improved BS per patient, but morning readings are still elevated. Advised to check a 3 am sugar once or twice to ensure hypoglycemia is not occurring.  A1c is due on or after Jan 17   Lab Results  Component Value Date   HGBA1C 8.3 (H) 03/22/2017   Lab Results  Component Value Date   MICROALBUR 2.4 (H) 05/18/2016

## 2017-06-10 NOTE — Assessment & Plan Note (Signed)
Trial of lexapro for flushing

## 2017-06-12 ENCOUNTER — Ambulatory Visit (INDEPENDENT_AMBULATORY_CARE_PROVIDER_SITE_OTHER): Payer: Worker's Compensation | Admitting: Orthopaedic Surgery

## 2017-06-12 ENCOUNTER — Ambulatory Visit (INDEPENDENT_AMBULATORY_CARE_PROVIDER_SITE_OTHER): Payer: Worker's Compensation

## 2017-06-12 ENCOUNTER — Encounter (INDEPENDENT_AMBULATORY_CARE_PROVIDER_SITE_OTHER): Payer: Self-pay | Admitting: Orthopaedic Surgery

## 2017-06-12 VITALS — BP 141/96 | HR 86 | Resp 14 | Ht 68.0 in | Wt 200.0 lb

## 2017-06-12 DIAGNOSIS — M25511 Pain in right shoulder: Secondary | ICD-10-CM

## 2017-06-12 NOTE — Progress Notes (Deleted)
Office Visit Note   Patient: [name]           Date of Birth: [birth year]           MRN: [id number] Visit Date: 06/12/2017              Requested by: Crecencio Mc, MD [address] [place], [place] [address] PCP: Crecencio Mc, MD   Assessment & Plan: Visit Diagnoses:  1. Acute pain of right shoulder     Plan: ***  Follow-Up Instructions: No Follow-up on file.   Orders:  Orders Placed This Encounter  Procedures  . XR Shoulder Right   No orders of the defined types were placed in this encounter.     Procedures: No procedures performed   Clinical Data: No additional findings.   Subjective: Chief Complaint  Patient presents with  . Right Shoulder - Injury, Pain    Ms. Escajeda is a 47 y o here today for right shoulder pain when she grabbed a pt and hurt the Right shoulder, hx of R RCT.    HPI  Review of Systems  Constitutional: Negative for chills, fatigue and fever.  Eyes: Negative for itching.  Respiratory: Negative for chest tightness and shortness of breath.   Cardiovascular: Negative for chest pain, palpitations and leg swelling.  Gastrointestinal: Negative for blood in stool, constipation and diarrhea.  Endocrine: Negative for polyuria.  Genitourinary: Negative for dysuria.  Musculoskeletal: Negative for back pain, joint swelling, neck pain and neck stiffness.  Allergic/Immunologic: Negative for immunocompromised state.  Neurological: Negative for dizziness and numbness.  Hematological: Does not bruise/bleed easily.  Psychiatric/Behavioral: The patient is not nervous/anxious.      Objective: Vital Signs: BP (!) 141/96   Pulse 86   Resp 14   Ht 5\' 8"  (1.727 m)   Wt 200 lb (90.7 kg)   BMI 30.41 kg/m   Physical Exam  Ortho Exam  Specialty Comments:  No specialty comments available.  Imaging: No results found.   PMFS History: Patient Active Problem List   Diagnosis Date Noted  . Menopause syndrome 06/10/2017   . Adverse reaction to vaccine, sequela 03/25/2017  . Atypical chest pain 02/27/2017  . PAC (premature atrial contraction) 02/27/2017  . PVC's (premature ventricular contractions) 02/27/2017  . PSVT (paroxysmal supraventricular tachycardia) ([place]) 11/26/2016  . History of palpitations 11/08/2016  . Nontraumatic incomplete tear of right rotator cuff 07/14/2016  . AC (acromioclavicular) arthritis 07/14/2016  . Impingement syndrome of right shoulder 07/14/2016  . Fibrocystic breast changes, right 05/21/2016  . Menopausal symptoms 10/30/2015  . Insomnia 10/16/2015  . Snoring 07/26/2015  . Migraine without aura and without status migrainosus, not intractable 02/24/2015  . Muscle spasm 02/24/2015  . Facial paresthesia 02/24/2015  . Concussion with loss of consciousness 07/21/2014  . Hyperlipidemia associated with type 2 diabetes mellitus ([place]) 06/13/2014  . Pain in joint, shoulder region 08/11/2013  . Vitamin D deficiency 05/07/2013  . Chronic migraine 11/20/2012  . S/P Total Abdominal Hysterectomy and Left Salpingo-oophorectomy 10/20/2011  . Headache disorder   . Uncontrolled type 2 diabetes mellitus ([place]) 07/20/2011  . Obesity (BMI 30-39.9) 07/20/2011  . Essential hypertension 07/20/2011   Past Medical History:  Diagnosis Date  . Allergy   . Anemia   . Anxiety    claustrophobic  . Asthma   . Diabetes mellitus 2011   did not start metforfin, losing weight  . Headache disorder   . Hypertension   . Shoulder impingement syndrome,  right     Family History  Problem Relation Age of Onset  . Diabetes Mother   . Heart disease Mother 35  . Hypertension Mother   . Hyperlipidemia Mother   . Heart failure Mother   . Cancer Father 58       Lung Cancer  . Heart disease Father   . Mental illness Sister        bipolar, substance abuse,  clean 2 yrs  . Hypertension Sister   . Cancer Paternal Grandmother 70       breast cancer  . Breast cancer Paternal Grandmother   . Diabetes Son       Past Surgical History:  Procedure Laterality Date  . ABDOMINAL HYSTERECTOMY  2006   heavy menses, endometriosis, l oophrectomy  . BACK SURGERY    . BREAST BIOPSY Right 2018   benign  . BREAST EXCISIONAL BIOPSY Right 2003   benign  . BREAST EXCISIONAL BIOPSY Right 1999   benign  . BREAST SURGERY     right breast x 2 , benign  . HERNIA REPAIR  2003   left inguinal   . LEFT OOPHORECTOMY    . SHOULDER ARTHROSCOPY WITH SUBACROMIAL DECOMPRESSION AND OPEN ROTATOR C Right 07/14/2016   Procedure: RIGHT SHOULDER ARTHROSCOPY WITH SUBACROMIAL DECOMPRESSION, DISTAL CLAVICLE RESECTION AND MINI OPEN ROTATOR CUFF REPAIR, OPEN BICEP TENDODESIS;  Surgeon: Garald Balding, MD;  Location: [facility];  Service: Orthopedics;  Laterality: Right;  . SHOULDER CLOSED REDUCTION Right 09/08/2016   Procedure: RIGHT CLOSED MANIPULATION SHOULDER;  Surgeon: Garald Balding, MD;  Location: [facility];  Service: Orthopedics;  Laterality: Right;  . TUBAL LIGATION     Social History   Occupational History  . Not on file  Tobacco Use  . Smoking status: Never Smoker  . Smokeless tobacco: Never Used  Substance and Sexual Activity  . Alcohol use: No  . Drug use: No  . Sexual activity: Not Currently    Partners: Male    Birth control/protection: Surgical

## 2017-06-12 NOTE — Progress Notes (Signed)
Office Visit Note   Patient: [name]           Date of Birth: [birth year]           MRN: [id number] Visit Date: 06/12/2017              Requested by: Crecencio Mc, MD [address] [place], [place] [address] PCP: Crecencio Mc, MD   Assessment & Plan: Visit Diagnoses:  1. Acute pain of right shoulder     Plan: Possible re-tear rotator cuff right shoulder. I would suggest an MRI scan. Patient is unable to raise her arm over her head with considerable pain with prior history of rotator cuff tear repair in February 2018  Follow-Up Instructions: Return after MRI.   Orders:  Orders Placed This Encounter  Procedures  . XR Shoulder Right  . MR SHOULDER RIGHT WO CONTRAST   No orders of the defined types were placed in this encounter.     Procedures: No procedures performed   Clinical Data: No additional findings.   Subjective: Chief Complaint  Patient presents with  . Right Shoulder - Injury, Pain    [name] is a 47 y o here today for right shoulder pain when she grabbed a pt and hurt the Right shoulder, hx of R RCT.  1 year status post rotator cuff tear repair involving his subacromial decompression distal clavicle resection. Did well only to have an adhesive capsulitis to required manipulation in April 2018. Has done well until 11 days ago when she was helping to lift a patient that she experienced acute onset of right shoulder pain. Since that time she's been very uncomfortable and unable to raise her arm over her head. She is right-hand dominant. Also is diabetic with a recent hemoglobin A1c of 8  HPI  Review of Systems   Objective: Vital Signs: BP (!) 141/96   Pulse 86   Resp 14   Ht 5\' 8"  (1.727 m)   Wt 200 lb (90.7 kg)   BMI 30.41 kg/m   Physical Exam  Ortho Exam awake alert and oriented 3 comfortable sitting. Pain with range of motion of right shoulder in any plane I could abduct about 80 and flex about the same at which  point she was very uncomfortable. I could slowly raise the arm to about 100 of flexion but she was very uncomfortable. No ecchymosis. Biceps appears to be intact. Skin intact. Good grip and good release. No neck pain  Specialty Comments:  No specialty comments available.  Imaging: Xr Shoulder Right  Result Date: 06/12/2017 The right shoulder obtained in several projections. There is no ectopic calcification. The humeral head is centered about the glenoid. There is some sclerosis about the greater tuberosity probably consistent with old surgery. Adequate resection of the distal clavicle . No evidence of a fracture    PMFS History: Patient Active Problem List   Diagnosis Date Noted  . Menopause syndrome 06/10/2017  . Adverse reaction to vaccine, sequela 03/25/2017  . Atypical chest pain 02/27/2017  . PAC (premature atrial contraction) 02/27/2017  . PVC's (premature ventricular contractions) 02/27/2017  . PSVT (paroxysmal supraventricular tachycardia) ([place]) 11/26/2016  . History of palpitations 11/08/2016  . Nontraumatic incomplete tear of right rotator cuff 07/14/2016  . AC (acromioclavicular) arthritis 07/14/2016  . Impingement syndrome of right shoulder 07/14/2016  . Fibrocystic breast changes, right 05/21/2016  . Menopausal symptoms 10/30/2015  . Insomnia 10/16/2015  . Snoring 07/26/2015  . Migraine without  aura and without status migrainosus, not intractable 02/24/2015  . Muscle spasm 02/24/2015  . Facial paresthesia 02/24/2015  . Concussion with loss of consciousness 07/21/2014  . Hyperlipidemia associated with type 2 diabetes mellitus ([place]) 06/13/2014  . Pain in joint, shoulder region 08/11/2013  . Vitamin D deficiency 05/07/2013  . Chronic migraine 11/20/2012  . S/P Total Abdominal Hysterectomy and Left Salpingo-oophorectomy 10/20/2011  . Headache disorder   . Uncontrolled type 2 diabetes mellitus ([place]) 07/20/2011  . Obesity (BMI 30-39.9) 07/20/2011  . Essential  hypertension 07/20/2011   Past Medical History:  Diagnosis Date  . Allergy   . Anemia   . Anxiety    claustrophobic  . Asthma   . Diabetes mellitus 2011   did not start metforfin, losing weight  . Headache disorder   . Hypertension   . Shoulder impingement syndrome, right     Family History  Problem Relation Age of Onset  . Diabetes Mother   . Heart disease Mother 26  . Hypertension Mother   . Hyperlipidemia Mother   . Heart failure Mother   . Cancer Father 71       Lung Cancer  . Heart disease Father   . Mental illness Sister        bipolar, substance abuse,  clean 2 yrs  . Hypertension Sister   . Cancer Paternal Grandmother 31       breast cancer  . Breast cancer Paternal Grandmother   . Diabetes Son     Past Surgical History:  Procedure Laterality Date  . ABDOMINAL HYSTERECTOMY  2006   heavy menses, endometriosis, l oophrectomy  . BACK SURGERY    . BREAST BIOPSY Right 2018   benign  . BREAST EXCISIONAL BIOPSY Right 2003   benign  . BREAST EXCISIONAL BIOPSY Right 1999   benign  . BREAST SURGERY     right breast x 2 , benign  . HERNIA REPAIR  2003   left inguinal   . LEFT OOPHORECTOMY    . SHOULDER ARTHROSCOPY WITH SUBACROMIAL DECOMPRESSION AND OPEN ROTATOR C Right 07/14/2016   Procedure: RIGHT SHOULDER ARTHROSCOPY WITH SUBACROMIAL DECOMPRESSION, DISTAL CLAVICLE RESECTION AND MINI OPEN ROTATOR CUFF REPAIR, OPEN BICEP TENDODESIS;  Surgeon: Garald Balding, MD;  Location: [facility];  Service: Orthopedics;  Laterality: Right;  . SHOULDER CLOSED REDUCTION Right 09/08/2016   Procedure: RIGHT CLOSED MANIPULATION SHOULDER;  Surgeon: Garald Balding, MD;  Location: [facility];  Service: Orthopedics;  Laterality: Right;  . TUBAL LIGATION     Social History   Occupational History  . Not on file  Tobacco Use  . Smoking status: Never Smoker  . Smokeless tobacco: Never Used  Substance and Sexual Activity  . Alcohol use: No  . Drug  use: No  . Sexual activity: Not Currently    Partners: Male    Birth control/protection: Surgical     Garald Balding, MD   Note - This record has been created using [employer].  Chart creation errors have been sought, but may not always  have been located. Such creation errors do not reflect on  the standard of medical care.

## 2017-06-15 ENCOUNTER — Other Ambulatory Visit (INDEPENDENT_AMBULATORY_CARE_PROVIDER_SITE_OTHER): Payer: Self-pay

## 2017-06-15 ENCOUNTER — Telehealth (INDEPENDENT_AMBULATORY_CARE_PROVIDER_SITE_OTHER): Payer: Self-pay | Admitting: Orthopaedic Surgery

## 2017-06-15 NOTE — Telephone Encounter (Signed)
MRI appt scheduled at [facility] 06/19/17 at 8:00 am. VM full, CB # in message wrong, unable to contact patient to advise of appt.

## 2017-06-15 NOTE — Telephone Encounter (Signed)
Patient stopped in requesting that when Dr. Durward Fortes schedules her MRI could she have a large machine because she is claustrophobic.  She also is not working this afternoon so she could go anytime after 1:00 pm.  CB # [phone]

## 2017-06-19 ENCOUNTER — Ambulatory Visit (HOSPITAL_COMMUNITY)
Admission: RE | Admit: 2017-06-19 | Discharge: 2017-06-19 | Disposition: A | Discharge status: Home / Self Care | Payer: PRIVATE HEALTH INSURANCE | Source: Ambulatory Visit | Attending: Orthopaedic Surgery | Admitting: Orthopaedic Surgery

## 2017-06-19 ENCOUNTER — Ambulatory Visit (INDEPENDENT_AMBULATORY_CARE_PROVIDER_SITE_OTHER): Payer: 59 | Admitting: Orthopaedic Surgery

## 2017-06-19 DIAGNOSIS — M67921 Unspecified disorder of synovium and tendon, right upper arm: Secondary | ICD-10-CM | POA: Insufficient documentation

## 2017-06-19 DIAGNOSIS — M25511 Pain in right shoulder: Secondary | ICD-10-CM | POA: Diagnosis present

## 2017-06-19 DIAGNOSIS — Z9889 Other specified postprocedural states: Secondary | ICD-10-CM | POA: Insufficient documentation

## 2017-06-20 ENCOUNTER — Emergency Department (HOSPITAL_COMMUNITY): Payer: 59

## 2017-06-20 ENCOUNTER — Emergency Department (HOSPITAL_COMMUNITY)
Admission: EM | Admit: 2017-06-20 | Discharge: 2017-06-20 | Disposition: A | Discharge status: Home / Self Care | Payer: 59 | Attending: Emergency Medicine | Admitting: Emergency Medicine

## 2017-06-20 DIAGNOSIS — J45909 Unspecified asthma, uncomplicated: Secondary | ICD-10-CM | POA: Diagnosis not present

## 2017-06-20 DIAGNOSIS — Z79899 Other long term (current) drug therapy: Secondary | ICD-10-CM | POA: Insufficient documentation

## 2017-06-20 DIAGNOSIS — R51 Headache: Secondary | ICD-10-CM | POA: Diagnosis not present

## 2017-06-20 DIAGNOSIS — Z7982 Long term (current) use of aspirin: Secondary | ICD-10-CM | POA: Insufficient documentation

## 2017-06-20 DIAGNOSIS — R079 Chest pain, unspecified: Secondary | ICD-10-CM | POA: Diagnosis not present

## 2017-06-20 DIAGNOSIS — S7001XA Contusion of right hip, initial encounter: Secondary | ICD-10-CM | POA: Insufficient documentation

## 2017-06-20 DIAGNOSIS — S161XXA Strain of muscle, fascia and tendon at neck level, initial encounter: Secondary | ICD-10-CM | POA: Diagnosis not present

## 2017-06-20 DIAGNOSIS — Y9389 Activity, other specified: Secondary | ICD-10-CM | POA: Insufficient documentation

## 2017-06-20 DIAGNOSIS — R109 Unspecified abdominal pain: Secondary | ICD-10-CM | POA: Insufficient documentation

## 2017-06-20 DIAGNOSIS — E119 Type 2 diabetes mellitus without complications: Secondary | ICD-10-CM | POA: Insufficient documentation

## 2017-06-20 DIAGNOSIS — S79911A Unspecified injury of right hip, initial encounter: Secondary | ICD-10-CM | POA: Diagnosis not present

## 2017-06-20 DIAGNOSIS — Z91040 Latex allergy status: Secondary | ICD-10-CM | POA: Diagnosis not present

## 2017-06-20 DIAGNOSIS — G4489 Other headache syndrome: Secondary | ICD-10-CM | POA: Diagnosis not present

## 2017-06-20 DIAGNOSIS — S3992XA Unspecified injury of lower back, initial encounter: Secondary | ICD-10-CM | POA: Diagnosis not present

## 2017-06-20 DIAGNOSIS — Y92410 Unspecified street and highway as the place of occurrence of the external cause: Secondary | ICD-10-CM | POA: Diagnosis not present

## 2017-06-20 DIAGNOSIS — Y999 Unspecified external cause status: Secondary | ICD-10-CM | POA: Insufficient documentation

## 2017-06-20 DIAGNOSIS — I10 Essential (primary) hypertension: Secondary | ICD-10-CM | POA: Insufficient documentation

## 2017-06-20 DIAGNOSIS — R52 Pain, unspecified: Secondary | ICD-10-CM

## 2017-06-20 DIAGNOSIS — S0990XA Unspecified injury of head, initial encounter: Secondary | ICD-10-CM | POA: Diagnosis not present

## 2017-06-20 DIAGNOSIS — S098XXA Other specified injuries of head, initial encounter: Secondary | ICD-10-CM | POA: Diagnosis not present

## 2017-06-20 DIAGNOSIS — S199XXA Unspecified injury of neck, initial encounter: Secondary | ICD-10-CM | POA: Diagnosis not present

## 2017-06-20 DIAGNOSIS — S299XXA Unspecified injury of thorax, initial encounter: Secondary | ICD-10-CM | POA: Diagnosis not present

## 2017-06-20 DIAGNOSIS — S3991XA Unspecified injury of abdomen, initial encounter: Secondary | ICD-10-CM | POA: Diagnosis not present

## 2017-06-20 LAB — I-STAT CHEM 8, ED
BUN: 20 mg/dL (ref 6–20)
Calcium, Ion: 1.17 mmol/L (ref 1.15–1.40)
Chloride: 103 mmol/L (ref 101–111)
Creatinine, Ser: 0.7 mg/dL (ref 0.44–1.00)
Glucose, Bld: 161 mg/dL — ABNORMAL HIGH (ref 65–99)
HCT: 41 % (ref 36.0–46.0)
Hemoglobin: 13.9 g/dL (ref 12.0–15.0)
Potassium: 3.6 mmol/L (ref 3.5–5.1)
Sodium: 140 mmol/L (ref 135–145)
TCO2: 24 mmol/L (ref 22–32)

## 2017-06-20 LAB — I-STAT BETA HCG BLOOD, ED (MC, WL, AP ONLY): I-stat hCG, quantitative: <5 m[IU]/mL (ref <5)

## 2017-06-20 MED ORDER — ONDANSETRON HCL 4 MG/2ML IJ SOLN
4.0000 mg | Freq: Once | INTRAMUSCULAR | Status: AC
  Administered 2017-06-20: 4 mg via INTRAVENOUS
  Filled 2017-06-20: qty 2

## 2017-06-20 MED ORDER — FENTANYL CITRATE (PF) 100 MCG/2ML IJ SOLN
50.0000 ug | Freq: Once | INTRAMUSCULAR | Status: AC
  Administered 2017-06-20: 50 ug via INTRAVENOUS
  Filled 2017-06-20: qty 2

## 2017-06-20 MED ORDER — CYCLOBENZAPRINE HCL 10 MG PO TABS: 10.0000 mg | ORAL_TABLET | Freq: Two times a day (BID) | ORAL | 0 refills | Status: DC | PRN

## 2017-06-20 MED ORDER — TRAMADOL HCL 50 MG PO TABS: 50.0000 mg | ORAL_TABLET | Freq: Four times a day (QID) | ORAL | 0 refills | Status: DC | PRN

## 2017-06-20 MED ORDER — IOPAMIDOL (ISOVUE-300) INJECTION 61%
INTRAVENOUS | Status: AC
  Administered 2017-06-20: 100 mL
  Filled 2017-06-20: qty 100
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
## 2017-06-20 NOTE — ED Notes (Addendum)
No changes, minimal relief. C-collar remains in place. Jacket removed for comfort. Pure wick disconnected for CT. CT transport here for pt. Family at [facility]. VSS. Remains alert, NAD, calm, interactive.

## 2017-06-20 NOTE — ED Notes (Signed)
Assisted pt. To restroom, was able to support weight on right leg from hip pain, but cannot ambulate very well.  FYI

## 2017-06-20 NOTE — Discharge Instructions (Signed)
Take the medications as needed for pain, follow-up with your primary care doctor or orthopedic doctor if not improving in the next 1-2 weeks
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
## 2017-06-22 ENCOUNTER — Emergency Department
Admission: EM | Admit: 2017-06-22 | Discharge: 2017-06-22 | Disposition: A | Discharge status: Home / Self Care | Payer: 59 | Attending: Emergency Medicine | Admitting: Emergency Medicine

## 2017-06-22 ENCOUNTER — Emergency Department: Payer: 59

## 2017-06-22 ENCOUNTER — Encounter: Payer: Self-pay | Admitting: Emergency Medicine

## 2017-06-22 ENCOUNTER — Telehealth: Payer: Self-pay | Admitting: Internal Medicine

## 2017-06-22 DIAGNOSIS — E119 Type 2 diabetes mellitus without complications: Secondary | ICD-10-CM | POA: Diagnosis not present

## 2017-06-22 DIAGNOSIS — Z7982 Long term (current) use of aspirin: Secondary | ICD-10-CM | POA: Insufficient documentation

## 2017-06-22 DIAGNOSIS — Z91040 Latex allergy status: Secondary | ICD-10-CM | POA: Insufficient documentation

## 2017-06-22 DIAGNOSIS — Z7984 Long term (current) use of oral hypoglycemic drugs: Secondary | ICD-10-CM | POA: Insufficient documentation

## 2017-06-22 DIAGNOSIS — R51 Headache: Secondary | ICD-10-CM | POA: Diagnosis not present

## 2017-06-22 DIAGNOSIS — G44311 Acute post-traumatic headache, intractable: Secondary | ICD-10-CM | POA: Insufficient documentation

## 2017-06-22 DIAGNOSIS — Z79899 Other long term (current) drug therapy: Secondary | ICD-10-CM | POA: Diagnosis not present

## 2017-06-22 DIAGNOSIS — F0781 Postconcussional syndrome: Secondary | ICD-10-CM | POA: Diagnosis not present

## 2017-06-22 DIAGNOSIS — I10 Essential (primary) hypertension: Secondary | ICD-10-CM | POA: Insufficient documentation

## 2017-06-22 LAB — URINALYSIS, COMPLETE (UACMP) WITH MICROSCOPIC
Bacteria, UA: NONE SEEN
Bilirubin Urine: NEGATIVE
Glucose, UA: NEGATIVE mg/dL
Hgb urine dipstick: NEGATIVE
Ketones, ur: NEGATIVE mg/dL
Leukocytes, UA: NEGATIVE
Nitrite: NEGATIVE
Protein, ur: NEGATIVE mg/dL
Specific Gravity, Urine: 1.018 (ref 1.005–1.030)
pH: 5 (ref 5.0–8.0)

## 2017-06-22 LAB — PREGNANCY, URINE: Preg Test, Ur: NEGATIVE

## 2017-06-22 MED ORDER — METOCLOPRAMIDE HCL 10 MG PO TABS
10.0000 mg | ORAL_TABLET | Freq: Once | ORAL | Status: AC
  Administered 2017-06-22: 10 mg via ORAL
  Filled 2017-06-22: qty 1

## 2017-06-22 MED ORDER — ONDANSETRON 4 MG PO TBDP
8.0000 mg | ORAL_TABLET | ORAL | Status: AC
  Administered 2017-06-22: 8 mg via ORAL
  Filled 2017-06-22: qty 2

## 2017-06-22 MED ORDER — ONDANSETRON 4 MG PO TBDP: 4.0000 mg | ORAL_TABLET | Freq: Three times a day (TID) | ORAL | 0 refills | Status: DC | PRN

## 2017-06-22 MED ORDER — DIPHENHYDRAMINE HCL 25 MG PO CAPS
25.0000 mg | ORAL_CAPSULE | Freq: Once | ORAL | Status: AC
  Administered 2017-06-22: 25 mg via ORAL
  Filled 2017-06-22: qty 1

## 2017-06-22 NOTE — ED Notes (Signed)
Patient transported to CT 

## 2017-06-22 NOTE — ED Notes (Signed)
Pt up to bathroom at this time
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
## 2017-06-22 NOTE — Discharge Instructions (Addendum)
Your CT scan of the head today was normal.  Continue to follow up with your doctor to monitor your concussion symptoms.
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
## 2017-06-22 NOTE — ED Notes (Signed)
Pt returned from CT at this time.  
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
## 2017-06-22 NOTE — ED Notes (Signed)
NAD noted at time of D/C. Pt taken to lobby via wheelchair by Thedore Mins, Careers adviser. Pt denies any comments/concerns at this time.

## 2017-06-22 NOTE — Telephone Encounter (Signed)
Please speak with patient

## 2017-06-22 NOTE — Telephone Encounter (Signed)
Went ahead and called the pt to let her know about the mychart message that Dr. Derrel Nip had sent her. The pt stated she was definitely going to go back to the ER because Dr. Lupita Dawn message sounded "a lot more serious".

## 2017-06-26 ENCOUNTER — Ambulatory Visit (INDEPENDENT_AMBULATORY_CARE_PROVIDER_SITE_OTHER): Payer: 59 | Admitting: Orthopaedic Surgery

## 2017-06-26 ENCOUNTER — Other Ambulatory Visit (INDEPENDENT_AMBULATORY_CARE_PROVIDER_SITE_OTHER): Payer: Self-pay

## 2017-06-26 ENCOUNTER — Encounter (INDEPENDENT_AMBULATORY_CARE_PROVIDER_SITE_OTHER): Payer: Self-pay | Admitting: Orthopaedic Surgery

## 2017-06-26 ENCOUNTER — Other Ambulatory Visit: Payer: Self-pay | Admitting: Internal Medicine

## 2017-06-26 ENCOUNTER — Ambulatory Visit (INDEPENDENT_AMBULATORY_CARE_PROVIDER_SITE_OTHER): Payer: PRIVATE HEALTH INSURANCE | Admitting: Orthopaedic Surgery

## 2017-06-26 VITALS — BP 104/69 | HR 72 | Resp 16 | Ht 66.0 in | Wt 220.0 lb

## 2017-06-26 VITALS — BP 145/91 | HR 88 | Resp 14 | Ht 68.0 in | Wt 200.0 lb

## 2017-06-26 DIAGNOSIS — M542 Cervicalgia: Secondary | ICD-10-CM | POA: Diagnosis not present

## 2017-06-26 DIAGNOSIS — M25551 Pain in right hip: Secondary | ICD-10-CM | POA: Diagnosis not present

## 2017-06-26 DIAGNOSIS — M25511 Pain in right shoulder: Secondary | ICD-10-CM | POA: Diagnosis not present

## 2017-06-26 DIAGNOSIS — G8929 Other chronic pain: Secondary | ICD-10-CM

## 2017-06-26 MED ORDER — DICLOFENAC SODIUM 2 % TD SOLN: 100.0000 g | TRANSDERMAL | 1 refills | Status: DC | PRN

## 2017-06-26 MED FILL — TRULICITY 1.5 MG/0.5 ML PEN: 1.5 | 28 days supply | Qty: 2 | Fill #2

## 2017-06-26 MED FILL — traMADol HCL 50 MG TABS: 50 | 4 days supply | Qty: 15 | Fill #0

## 2017-06-26 MED FILL — ATORVASTATIN 20 MG TABLET: 20 | 90 days supply | Qty: 90 | Fill #0

## 2017-06-26 MED FILL — ONDANSETRON ODT 4 MG TABLET: 4 | 7 days supply | Qty: 20 | Fill #0

## 2017-06-26 MED FILL — CYCLOBENZAPRINE 10 MG TAB: 10 | 10 days supply | Qty: 20 | Fill #0
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
## 2017-06-27 ENCOUNTER — Other Ambulatory Visit (INDEPENDENT_AMBULATORY_CARE_PROVIDER_SITE_OTHER): Payer: Self-pay

## 2017-06-27 ENCOUNTER — Ambulatory Visit: Payer: 59 | Attending: Orthopaedic Surgery | Admitting: Physical Therapy

## 2017-06-27 ENCOUNTER — Other Ambulatory Visit: Payer: Self-pay

## 2017-06-27 ENCOUNTER — Encounter: Payer: Self-pay | Admitting: Physical Therapy

## 2017-06-27 DIAGNOSIS — M542 Cervicalgia: Secondary | ICD-10-CM | POA: Diagnosis not present

## 2017-06-27 DIAGNOSIS — M62838 Other muscle spasm: Secondary | ICD-10-CM | POA: Insufficient documentation

## 2017-06-27 DIAGNOSIS — R293 Abnormal posture: Secondary | ICD-10-CM | POA: Insufficient documentation

## 2017-06-27 MED ORDER — DICLOFENAC SODIUM 2 % TD SOLN: TRANSDERMAL | 1 refills | Status: DC
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
## 2017-06-28 ENCOUNTER — Telehealth (INDEPENDENT_AMBULATORY_CARE_PROVIDER_SITE_OTHER): Payer: Self-pay

## 2017-06-28 ENCOUNTER — Telehealth: Payer: Self-pay | Admitting: Rheumatology

## 2017-06-28 NOTE — Telephone Encounter (Signed)
faxed

## 2017-06-28 NOTE — Telephone Encounter (Signed)
Faxed the 06/26/17 office and work note to [facility] [phone]

## 2017-06-28 NOTE — Telephone Encounter (Signed)
Meredeth Ide, case manager for patient requested the order for Physical Therapy be faxed to her.  Fax # [phone]

## 2017-06-28 NOTE — Telephone Encounter (Signed)
Can you do this Stephanie

## 2017-06-29 ENCOUNTER — Other Ambulatory Visit (INDEPENDENT_AMBULATORY_CARE_PROVIDER_SITE_OTHER): Payer: Self-pay

## 2017-06-29 ENCOUNTER — Ambulatory Visit (INDEPENDENT_AMBULATORY_CARE_PROVIDER_SITE_OTHER): Payer: Self-pay | Admitting: Orthopaedic Surgery

## 2017-06-29 ENCOUNTER — Encounter (INDEPENDENT_AMBULATORY_CARE_PROVIDER_SITE_OTHER): Payer: Self-pay

## 2017-06-29 ENCOUNTER — Telehealth (INDEPENDENT_AMBULATORY_CARE_PROVIDER_SITE_OTHER): Payer: Self-pay | Admitting: Orthopaedic Surgery

## 2017-06-29 NOTE — Telephone Encounter (Signed)
faxed

## 2017-06-29 NOTE — Telephone Encounter (Signed)
[name], patient's case manager called to request a copy of her return to work note.  It can be faxed to her office at [phone]

## 2017-06-30 ENCOUNTER — Other Ambulatory Visit (INDEPENDENT_AMBULATORY_CARE_PROVIDER_SITE_OTHER): Payer: Self-pay

## 2017-06-30 MED ORDER — DICLOFENAC SODIUM 1 % TD GEL: 2.0000 g | Freq: Four times a day (QID) | TRANSDERMAL | 2 refills | Status: DC

## 2017-07-03 ENCOUNTER — Telehealth: Payer: Self-pay | Admitting: Orthopaedic Surgery

## 2017-07-03 ENCOUNTER — Encounter (INDEPENDENT_AMBULATORY_CARE_PROVIDER_SITE_OTHER): Payer: Self-pay | Admitting: Orthopaedic Surgery

## 2017-07-03 ENCOUNTER — Ambulatory Visit (INDEPENDENT_AMBULATORY_CARE_PROVIDER_SITE_OTHER): Payer: 59 | Admitting: Orthopaedic Surgery

## 2017-07-03 DIAGNOSIS — M542 Cervicalgia: Secondary | ICD-10-CM | POA: Diagnosis not present

## 2017-07-03 DIAGNOSIS — M25551 Pain in right hip: Secondary | ICD-10-CM

## 2017-07-03 NOTE — Telephone Encounter (Signed)
LVMOM for pharmacy NOT to fill
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
## 2017-07-03 NOTE — Telephone Encounter (Addendum)
[name] with [facility] left a message on voicemail 06/29/17 to request a call back to verify usage directions for rx Pensaid solution. Per pharmacy 100 grams every 4 hours seems high. Please call to inform.

## 2017-07-05 ENCOUNTER — Other Ambulatory Visit: Payer: Self-pay | Admitting: Internal Medicine

## 2017-07-05 ENCOUNTER — Ambulatory Visit: Payer: 59 | Admitting: Physical Therapy

## 2017-07-05 DIAGNOSIS — E1165 Type 2 diabetes mellitus with hyperglycemia: Secondary | ICD-10-CM | POA: Diagnosis not present

## 2017-07-06 ENCOUNTER — Other Ambulatory Visit: Payer: Self-pay | Admitting: Internal Medicine

## 2017-07-06 DIAGNOSIS — E1165 Type 2 diabetes mellitus with hyperglycemia: Secondary | ICD-10-CM | POA: Diagnosis not present

## 2017-07-08 LAB — MICROALBUMIN / CREATININE URINE RATIO
Creatinine, Urine: 171.3 mg/dL
Microalb/Creat Ratio: 14.4 mg/g creat (ref 0.0–30.0)
Microalbumin, Urine: 24.7 ug/mL

## 2017-07-10 ENCOUNTER — Ambulatory Visit (INDEPENDENT_AMBULATORY_CARE_PROVIDER_SITE_OTHER): Payer: Self-pay | Admitting: Orthopaedic Surgery

## 2017-07-11 MED FILL — DILTIAZEM ER 240 MG TABLET: 240 | 30 days supply | Qty: 30 | Fill #3

## 2017-07-12 ENCOUNTER — Encounter: Payer: Self-pay | Admitting: Physical Therapy

## 2017-07-12 ENCOUNTER — Encounter: Payer: Self-pay | Admitting: Internal Medicine

## 2017-07-12 ENCOUNTER — Ambulatory Visit: Payer: 59 | Attending: Orthopaedic Surgery | Admitting: Physical Therapy

## 2017-07-12 DIAGNOSIS — R293 Abnormal posture: Secondary | ICD-10-CM | POA: Insufficient documentation

## 2017-07-12 DIAGNOSIS — M62838 Other muscle spasm: Secondary | ICD-10-CM | POA: Diagnosis not present

## 2017-07-12 DIAGNOSIS — M542 Cervicalgia: Secondary | ICD-10-CM | POA: Diagnosis not present

## 2017-07-12 MED FILL — EMGALITY 120 MG/ML SOAJ: 120 | 30 days supply | Qty: 1 | Fill #1

## 2017-07-12 NOTE — Patient Instructions (Signed)

## 2017-07-12 NOTE — Therapy (Signed)
[place], [place], [address] Phone: [phone]   Fax:  [phone]  Physical Therapy Treatment  Patient Details  Name: [name] MRN: [id number] Date of Birth: 12-03-1970 Referring Provider: Collier Salina whitfield MD   Encounter Date: 07/12/2017  PT End of Session - 07/12/17 1636    Visit Number  2    Number of Visits  13    Date for PT Re-Evaluation  08/08/17    PT Start Time  1636 pt arrived 5 min late    PT Stop Time  1725    PT Time Calculation (min)  49 min    Activity Tolerance  Patient tolerated treatment well    Behavior During Therapy  [facility] for tasks assessed/performed       Past Medical History:  Diagnosis Date  . Allergy   . Anemia   . Anxiety    claustrophobic  . Asthma   . Diabetes mellitus 2011   did not start metforfin, losing weight  . Headache disorder   . Hypertension   . Shoulder impingement syndrome, right     Past Surgical History:  Procedure Laterality Date  . ABDOMINAL HYSTERECTOMY  2006   heavy menses, endometriosis, l oophrectomy  . BACK SURGERY    . BREAST BIOPSY Right 2018   benign  . BREAST EXCISIONAL BIOPSY Right 2003   benign  . BREAST EXCISIONAL BIOPSY Right 1999   benign  . BREAST SURGERY     right breast x 2 , benign  . HERNIA REPAIR  2003   left inguinal   . LEFT OOPHORECTOMY    . SHOULDER ARTHROSCOPY WITH SUBACROMIAL DECOMPRESSION AND OPEN ROTATOR C Right 07/14/2016   Procedure: RIGHT SHOULDER ARTHROSCOPY WITH SUBACROMIAL DECOMPRESSION, DISTAL CLAVICLE RESECTION AND MINI OPEN ROTATOR CUFF REPAIR, OPEN BICEP TENDODESIS;  Surgeon: Garald Balding, MD;  Location: [place];  Service: Orthopedics;  Laterality: Right;  . SHOULDER CLOSED REDUCTION Right 09/08/2016   Procedure: RIGHT CLOSED MANIPULATION SHOULDER;  Surgeon: Garald Balding, MD;  Location: [place];  Service: Orthopedics;  Laterality: Right;  . TUBAL LIGATION       There were no vitals filed for this visit.  Subjective Assessment - 07/12/17 1635    Subjective  "I was having more spasm in the neck that started this morning. I spoke with my MD regarding getting an updated referral for low back"    Currently in Pain?  Yes    Pain Score  5     Pain Location  Neck    Pain Orientation  Left    Pain Type  Acute pain    Pain Frequency  Intermittent    Aggravating Factors   looking to the R/L    Pain Relieving Factors  ice medicaiton                      [facility] Adult PT Treatment/Exercise - 07/12/17 1702      Modalities   Modalities  Moist Heat      Moist Heat Therapy   Number Minutes Moist Heat  10 Minutes    Moist Heat Location  Cervical      Manual Therapy   Manual Therapy  Joint mobilization;Soft tissue mobilization;Other (comment);Passive ROM;Manual Traction    Manual therapy comments  MTPR along the L upper trap/ levator scapulae    Joint Mobilization  L first rib mobs grade 3, cervical mobs grade 3 PA  and lateral gapping C3-C7    Passive ROM  cervical rotation bil working gradually into end ranges    Manual Traction  intermittent holding 30 sec and release 10 sec, x 75min    Other Manual Therapy  sub-occipital release             PT Education - 07/12/17 1649    Education provided  Yes    Education Details  muscle anatomy and referral patterns. What TPDN is, benefits of treatment and what to expect.     Person(s) Educated  Patient    Methods  Explanation;Verbal cues;Handout    Comprehension  Verbalized understanding       PT Short Term Goals - 06/27/17 1716      PT SHORT TERM GOAL #1   Title  pt to be I with initial HEP    Time  3    Period  Weeks    Status  New    Target Date  07/18/17      PT SHORT TERM GOAL #2   Title  pt to verbalize/ demo proper posture in sitting/ standing and lifting mechanics to prevent and reduce neck pain    Time  3    Period  Weeks    Status  New    Target Date  07/18/17       PT SHORT TERM GOAL #3   Title  pt to decrease upper trap and surrounding muscle spasm to reduce pain to </= 5/10 and promote cervical mobility    Time  3    Period  Weeks    Status  New    Target Date  07/18/17        PT Long Term Goals - 06/27/17 1720      PT LONG TERM GOAL #1   Title  Pt to improve cervical flexion/ ext by >/= 15 degrees and rotaton to >/= 60 with </= 2/10 pain to promote functional mobility for safety with driving.     Time  6    Period  Weeks    Status  New    Target Date  08/08/17      PT LONG TERM GOAL #2   Title  pt be able to sit and perform desk work and ADLs with </= 2/10 pain to promote lite duty work activities and promote return to activity.     Time  6    Period  Weeks    Status  New    Target Date  08/08/17      PT LONG TERM GOAL #3   Title  pt to report decreased incidence of HA to </= 1-2 per week to demo improving condition    Time  6    Period  Weeks    Status  New    Target Date  08/08/17      PT LONG TERM GOAL #4   Title  Increase FOTO score to </= 32% limited to demo improvement in function     Time  6    Period  Weeks    Status  New    Target Date  08/08/17      PT LONG TERM GOAL #5   Title  pt to be I with all HEP given as of last visit to maintain and progress their current level of function     Time  6    Period  Weeks    Status  New    Target Date  08/08/17  Plan - 07/12/17 1743    Clinical Impression Statement  pt reports consistency with her HEP from her evaluation. focused todays session on manual techniques to promote cervical mobility and reduce pain which she reported decreased pain with intermittent manual traction. Discussed DN which pt was reluctant but want to think about it next session.. MHP post session for soreness.     PT Treatment/Interventions  ADLs/Self Care Home Management;Cryotherapy;Electrical Stimulation;Iontophoresis 4mg /ml Dexamethasone;Moist Heat;Ultrasound;Taping;Therapeutic  activities;Therapeutic exercise;Manual techniques;Patient/family education;Dry needling;Passive range of motion;Traction    PT Next Visit Plan  review/ update HEP PRN, soft tissue techniques, cervical mobs, PROM, gentle scapular stability, trial mechanical traction?    PT Home Exercise Plan  upper trap stretching, levator scap stretch, chin tucks (in supine), upper cervical rotation.     Consulted and Agree with Plan of Care  Patient       Patient will benefit from skilled therapeutic intervention in order to improve the following deficits and impairments:  Pain, Decreased activity tolerance, Postural dysfunction, Improper body mechanics, Hypomobility, Decreased strength, Increased fascial restricitons, Increased muscle spasms, Decreased range of motion, Decreased endurance  Visit Diagnosis: Cervicalgia  Other muscle spasm  Abnormal posture     Problem List Patient Active Problem List   Diagnosis Date Noted  . Menopause syndrome 06/10/2017  . Adverse reaction to vaccine, sequela 03/25/2017  . Atypical chest pain 02/27/2017  . PAC (premature atrial contraction) 02/27/2017  . PVC's (premature ventricular contractions) 02/27/2017  . PSVT (paroxysmal supraventricular tachycardia) ([place]) 11/26/2016  . History of palpitations 11/08/2016  . Nontraumatic incomplete tear of right rotator cuff 07/14/2016  . AC (acromioclavicular) arthritis 07/14/2016  . Impingement syndrome of right shoulder 07/14/2016  . Fibrocystic breast changes, right 05/21/2016  . Menopausal symptoms 10/30/2015  . Insomnia 10/16/2015  . Snoring 07/26/2015  . Migraine without aura and without status migrainosus, not intractable 02/24/2015  . Muscle spasm 02/24/2015  . Facial paresthesia 02/24/2015  . Concussion with loss of consciousness 07/21/2014  . Hyperlipidemia associated with type 2 diabetes mellitus ([place]) 06/13/2014  . Pain in joint, shoulder region 08/11/2013  . Vitamin D deficiency 05/07/2013  . Chronic  migraine 11/20/2012  . S/P Total Abdominal Hysterectomy and Left Salpingo-oophorectomy 10/20/2011  . Headache disorder   . Uncontrolled type 2 diabetes mellitus ([place]) 07/20/2011  . Obesity (BMI 30-39.9) 07/20/2011  . Essential hypertension 07/20/2011   Starr Lake PT, DPT, LAT, ATC  07/12/17  5:48 PM      [facility] [address] [place], [place], [address] Phone: [phone]   Fax:  [phone]  Name: EVANA RUNNELS MRN: [id number] Date of Birth: 1970/12/18

## 2017-07-13 ENCOUNTER — Telehealth: Payer: Self-pay

## 2017-07-13 DIAGNOSIS — E785 Hyperlipidemia, unspecified: Secondary | ICD-10-CM

## 2017-07-13 DIAGNOSIS — I10 Essential (primary) hypertension: Secondary | ICD-10-CM

## 2017-07-13 DIAGNOSIS — E1169 Type 2 diabetes mellitus with other specified complication: Secondary | ICD-10-CM

## 2017-07-13 LAB — COMPREHENSIVE METABOLIC PANEL

## 2017-07-13 LAB — LIPID PANEL W/O CHOL/HDL RATIO

## 2017-07-13 LAB — HGB A1C W/O EAG: Hgb A1c MFr Bld: 7.7 % — ABNORMAL HIGH (ref 4.8–5.6)

## 2017-07-13 LAB — SPECIMEN STATUS REPORT

## 2017-07-13 NOTE — Telephone Encounter (Signed)
Pt must have had labs drawn at [facility]. We did not draw her here. She may need to contact them or have them redrawn. She may need a new order too.

## 2017-07-13 NOTE — Telephone Encounter (Signed)
[facility] calls [phone] option 1ext [phone] serum tubes were lost before reaching [place] [facility] and lipid.   panel .  They are notifying office .

## 2017-07-17 ENCOUNTER — Encounter: Payer: Self-pay | Admitting: Internal Medicine

## 2017-07-17 ENCOUNTER — Encounter: Payer: Self-pay | Admitting: Physician Assistant

## 2017-07-18 ENCOUNTER — Ambulatory Visit: Payer: 59 | Admitting: Physical Therapy

## 2017-07-18 ENCOUNTER — Encounter: Payer: Self-pay | Admitting: Physical Therapy

## 2017-07-18 DIAGNOSIS — M542 Cervicalgia: Secondary | ICD-10-CM | POA: Diagnosis not present

## 2017-07-18 DIAGNOSIS — R293 Abnormal posture: Secondary | ICD-10-CM | POA: Diagnosis not present

## 2017-07-18 DIAGNOSIS — M62838 Other muscle spasm: Secondary | ICD-10-CM | POA: Diagnosis not present

## 2017-07-18 NOTE — Therapy (Signed)
[place], [place], [address] Phone: [phone]   Fax:  [phone]  Physical Therapy Treatment  Patient Details  Name: [name] MRN: [id number] Date of Birth: 08/04/1970 Referring Provider: Collier Salina whitfield MD   Encounter Date: 07/18/2017  PT End of Session - 07/18/17 1714    Visit Number  3    Number of Visits  13    Date for PT Re-Evaluation  08/08/17    PT Start Time  1324    PT Stop Time  1720    PT Time Calculation (min)  45 min    Activity Tolerance  Patient tolerated treatment well    Behavior During Therapy  [facility] for tasks assessed/performed       Past Medical History:  Diagnosis Date  . Allergy   . Anemia   . Anxiety    claustrophobic  . Asthma   . Diabetes mellitus 2011   did not start metforfin, losing weight  . Headache disorder   . Hypertension   . Shoulder impingement syndrome, right     Past Surgical History:  Procedure Laterality Date  . ABDOMINAL HYSTERECTOMY  2006   heavy menses, endometriosis, l oophrectomy  . BACK SURGERY    . BREAST BIOPSY Right 2018   benign  . BREAST EXCISIONAL BIOPSY Right 2003   benign  . BREAST EXCISIONAL BIOPSY Right 1999   benign  . BREAST SURGERY     right breast x 2 , benign  . HERNIA REPAIR  2003   left inguinal   . LEFT OOPHORECTOMY    . SHOULDER ARTHROSCOPY WITH SUBACROMIAL DECOMPRESSION AND OPEN ROTATOR C Right 07/14/2016   Procedure: RIGHT SHOULDER ARTHROSCOPY WITH SUBACROMIAL DECOMPRESSION, DISTAL CLAVICLE RESECTION AND MINI OPEN ROTATOR CUFF REPAIR, OPEN BICEP TENDODESIS;  Surgeon: Garald Balding, MD;  Location: [place];  Service: Orthopedics;  Laterality: Right;  . SHOULDER CLOSED REDUCTION Right 09/08/2016   Procedure: RIGHT CLOSED MANIPULATION SHOULDER;  Surgeon: Garald Balding, MD;  Location: [place];  Service: Orthopedics;  Laterality: Right;  . TUBAL LIGATION      There were no  vitals filed for this visit.  Subjective Assessment - 07/18/17 1638    Subjective  " the tennis ball technique you showed me really helps. I did get a massage since the last session and it seems to help."    Currently in Pain?  Yes    Pain Score  6     Pain Orientation  Left    Pain Type  Chronic pain    Pain Onset  More than a month ago    Pain Frequency  Intermittent                      [facility] Adult PT Treatment/Exercise - 07/18/17 1713      Modalities   Modalities  Traction      Traction   Type of Traction  Cervical    Min (lbs)  7    Max (lbs)  12    Hold Time  60 sec    Rest Time  30    Time  10 min      Manual Therapy   Manual Therapy  Neural Stretch    Manual therapy comments  MTPR along the L upper trap/ levator scapulae    Joint Mobilization  L first rib mobs grade 3, cervical mobs grade 3 PA and lateral gapping C3-C7  Other Manual Therapy  sub-occipital release    Neural Stretch  Ulnar nerve glides on LUE             PT Education - 07/18/17 1716    Education Details  pt reports noticing times where she is feeling confused and has difficulty remember what she is doing, discussed it could be related to her concussion but also to check facial symmatry or any unilateral weakness for an potential stroke symptoms    Person(s) Educated  Patient    Methods  Explanation;Verbal cues    Comprehension  Verbalized understanding;Verbal cues required       PT Short Term Goals - 06/27/17 1716      PT SHORT TERM GOAL #1   Title  pt to be I with initial HEP    Time  3    Period  Weeks    Status  New    Target Date  07/18/17      PT SHORT TERM GOAL #2   Title  pt to verbalize/ demo proper posture in sitting/ standing and lifting mechanics to prevent and reduce neck pain    Time  3    Period  Weeks    Status  New    Target Date  07/18/17      PT SHORT TERM GOAL #3   Title  pt to decrease upper trap and surrounding muscle spasm to reduce pain to  </= 5/10 and promote cervical mobility    Time  3    Period  Weeks    Status  New    Target Date  07/18/17        PT Long Term Goals - 06/27/17 1720      PT LONG TERM GOAL #1   Title  Pt to improve cervical flexion/ ext by >/= 15 degrees and rotaton to >/= 60 with </= 2/10 pain to promote functional mobility for safety with driving.     Time  6    Period  Weeks    Status  New    Target Date  08/08/17      PT LONG TERM GOAL #2   Title  pt be able to sit and perform desk work and ADLs with </= 2/10 pain to promote lite duty work activities and promote return to activity.     Time  6    Period  Weeks    Status  New    Target Date  08/08/17      PT LONG TERM GOAL #3   Title  pt to report decreased incidence of HA to </= 1-2 per week to demo improving condition    Time  6    Period  Weeks    Status  New    Target Date  08/08/17      PT LONG TERM GOAL #4   Title  Increase FOTO score to </= 32% limited to demo improvement in function     Time  6    Period  Weeks    Status  New    Target Date  08/08/17      PT LONG TERM GOAL #5   Title  pt to be I with all HEP given as of last visit to maintain and progress their current level of function     Time  6    Period  Weeks    Status  New    Target Date  08/08/17  Plan - 07/18/17 1714    Clinical Impression Statement  pt reports continues soreness in the neck and having increased bouts of confusion with difficulty remember. Continued working on manual techniques and nerve glides. trialed mechanical traction which she reported significant relief of cervical pain and LUE referral symptoms. post session she reported some soreness but overrall felt better.     PT Next Visit Plan  review/ update HEP PRN, soft tissue techniques, cervical mobs, PROM, gentle scapular stability, how was mechanicL traction?    PT Home Exercise Plan  upper trap stretching, levator scap stretch, chin tucks (in supine), upper cervical rotation.      Consulted and Agree with Plan of Care  Patient       Patient will benefit from skilled therapeutic intervention in order to improve the following deficits and impairments:  Pain, Decreased activity tolerance, Postural dysfunction, Improper body mechanics, Hypomobility, Decreased strength, Increased fascial restricitons, Increased muscle spasms, Decreased range of motion, Decreased endurance  Visit Diagnosis: Cervicalgia  Other muscle spasm  Abnormal posture     Problem List Patient Active Problem List   Diagnosis Date Noted  . Menopause syndrome 06/10/2017  . Adverse reaction to vaccine, sequela 03/25/2017  . Atypical chest pain 02/27/2017  . PAC (premature atrial contraction) 02/27/2017  . PVC's (premature ventricular contractions) 02/27/2017  . PSVT (paroxysmal supraventricular tachycardia) ([place]) 11/26/2016  . History of palpitations 11/08/2016  . Nontraumatic incomplete tear of right rotator cuff 07/14/2016  . AC (acromioclavicular) arthritis 07/14/2016  . Impingement syndrome of right shoulder 07/14/2016  . Fibrocystic breast changes, right 05/21/2016  . Menopausal symptoms 10/30/2015  . Insomnia 10/16/2015  . Snoring 07/26/2015  . Migraine without aura and without status migrainosus, not intractable 02/24/2015  . Muscle spasm 02/24/2015  . Facial paresthesia 02/24/2015  . Concussion with loss of consciousness 07/21/2014  . Hyperlipidemia associated with type 2 diabetes mellitus ([place]) 06/13/2014  . Pain in joint, shoulder region 08/11/2013  . Vitamin D deficiency 05/07/2013  . Chronic migraine 11/20/2012  . S/P Total Abdominal Hysterectomy and Left Salpingo-oophorectomy 10/20/2011  . Headache disorder   . Uncontrolled type 2 diabetes mellitus ([place]) 07/20/2011  . Obesity (BMI 30-39.9) 07/20/2011  . Essential hypertension 07/20/2011   Starr Lake PT, DPT, LAT, ATC  07/18/17  5:18 PM      [facility] [address] [place], [place], [address] Phone: [phone]   Fax:  [phone]  Name: [name] MRN: [id number] Date of Birth: February 01, 1971

## 2017-07-19 ENCOUNTER — Telehealth: Payer: Self-pay | Admitting: *Deleted

## 2017-07-19 ENCOUNTER — Encounter: Payer: Self-pay | Admitting: *Deleted

## 2017-07-19 DIAGNOSIS — Z87820 Personal history of traumatic brain injury: Secondary | ICD-10-CM

## 2017-07-19 NOTE — Telephone Encounter (Signed)
Order place for neurology

## 2017-07-20 ENCOUNTER — Encounter: Payer: Self-pay | Admitting: Physical Therapy

## 2017-07-20 ENCOUNTER — Ambulatory Visit: Payer: 59 | Admitting: Physical Therapy

## 2017-07-20 DIAGNOSIS — M62838 Other muscle spasm: Secondary | ICD-10-CM

## 2017-07-20 DIAGNOSIS — M542 Cervicalgia: Secondary | ICD-10-CM

## 2017-07-20 DIAGNOSIS — R293 Abnormal posture: Secondary | ICD-10-CM | POA: Diagnosis not present

## 2017-07-20 NOTE — Therapy (Signed)
[place], [place], [address] Phone: [phone]   Fax:  [phone]  Physical Therapy Treatment  Patient Details  Name: [name] MRN: [id number] Date of Birth: Nov 07, 1970 Referring Provider: Collier Salina whitfield MD   Encounter Date: 07/20/2017  PT End of Session - 07/20/17 1638    Visit Number  4    Number of Visits  13    Date for PT Re-Evaluation  08/08/17    PT Start Time  1937    PT Stop Time  1723    PT Time Calculation (min)  48 min    Activity Tolerance  Patient tolerated treatment well    Behavior During Therapy  [facility] for tasks assessed/performed       Past Medical History:  Diagnosis Date  . Allergy   . Anemia   . Anxiety    claustrophobic  . Asthma   . Diabetes mellitus 2011   did not start metforfin, losing weight  . Headache disorder   . Hypertension   . Shoulder impingement syndrome, right     Past Surgical History:  Procedure Laterality Date  . ABDOMINAL HYSTERECTOMY  2006   heavy menses, endometriosis, l oophrectomy  . BACK SURGERY    . BREAST BIOPSY Right 2018   benign  . BREAST EXCISIONAL BIOPSY Right 2003   benign  . BREAST EXCISIONAL BIOPSY Right 1999   benign  . BREAST SURGERY     right breast x 2 , benign  . HERNIA REPAIR  2003   left inguinal   . LEFT OOPHORECTOMY    . SHOULDER ARTHROSCOPY WITH SUBACROMIAL DECOMPRESSION AND OPEN ROTATOR C Right 07/14/2016   Procedure: RIGHT SHOULDER ARTHROSCOPY WITH SUBACROMIAL DECOMPRESSION, DISTAL CLAVICLE RESECTION AND MINI OPEN ROTATOR CUFF REPAIR, OPEN BICEP TENDODESIS;  Surgeon: Garald Balding, MD;  Location: [place];  Service: Orthopedics;  Laterality: Right;  . SHOULDER CLOSED REDUCTION Right 09/08/2016   Procedure: RIGHT CLOSED MANIPULATION SHOULDER;  Surgeon: Garald Balding, MD;  Location: [place] [facility];  Service: Orthopedics;  Laterality: Right;  . TUBAL LIGATION      There were no  vitals filed for this visit.  Subjective Assessment - 07/20/17 1635    Subjective  "I still use my tennis balls to calm down the pain. I am in love with the traction unit but I did have alot of spasm in my shoulder since the last session.     Currently in Pain?  Yes    Pain Score  6     Aggravating Factors   looking to the L and R    Pain Relieving Factors  ice, medication                      [facility] Adult PT Treatment/Exercise - 07/20/17 1651      Neck Exercises: Supine   Neck Retraction  10 reps;5 secs      Moist Heat Therapy   Number Minutes Moist Heat  10 Minutes    Moist Heat Location  Cervical      Manual Therapy   Manual Therapy  Soft tissue mobilization    Manual therapy comments  skilled palpation skilled monitoring throughout DN    Joint Mobilization  L first rib mobs grade 3, T1-T8 grade 3-4    Soft tissue mobilization  IASTM over the L upper trap    Manual Traction  intermittent holding 30 sec and release 10 sec,  x 3min    Other Manual Therapy  sub-occipital release       Trigger Point Dry Needling - 07/20/17 1649    Consent Given?  Yes    Education Handout Provided  Yes    Muscles Treated Upper Body  Upper trapezius    Upper Trapezius Response  Twitch reponse elicited;Palpable increased muscle length L           PT Education - 07/20/17 1638    Education provided  Yes    Education Details  reviewed DN information provided previous session.     Person(s) Educated  Patient    Methods  Explanation;Verbal cues    Comprehension  Verbalized understanding;Verbal cues required       PT Short Term Goals - 06/27/17 1716      PT SHORT TERM GOAL #1   Title  pt to be I with initial HEP    Time  3    Period  Weeks    Status  New    Target Date  07/18/17      PT SHORT TERM GOAL #2   Title  pt to verbalize/ demo proper posture in sitting/ standing and lifting mechanics to prevent and reduce neck pain    Time  3    Period  Weeks    Status  New     Target Date  07/18/17      PT SHORT TERM GOAL #3   Title  pt to decrease upper trap and surrounding muscle spasm to reduce pain to </= 5/10 and promote cervical mobility    Time  3    Period  Weeks    Status  New    Target Date  07/18/17        PT Long Term Goals - 06/27/17 1720      PT LONG TERM GOAL #1   Title  Pt to improve cervical flexion/ ext by >/= 15 degrees and rotaton to >/= 60 with </= 2/10 pain to promote functional mobility for safety with driving.     Time  6    Period  Weeks    Status  New    Target Date  08/08/17      PT LONG TERM GOAL #2   Title  pt be able to sit and perform desk work and ADLs with </= 2/10 pain to promote lite duty work activities and promote return to activity.     Time  6    Period  Weeks    Status  New    Target Date  08/08/17      PT LONG TERM GOAL #3   Title  pt to report decreased incidence of HA to </= 1-2 per week to demo improving condition    Time  6    Period  Weeks    Status  New    Target Date  08/08/17      PT LONG TERM GOAL #4   Title  Increase FOTO score to </= 32% limited to demo improvement in function     Time  6    Period  Weeks    Status  New    Target Date  08/08/17      PT LONG TERM GOAL #5   Title  pt to be I with all HEP given as of last visit to maintain and progress their current level of function     Time  6    Period  Weeks  Status  New    Target Date  08/08/17            Plan - 07/20/17 1709    Clinical Impression Statement  pt reported improvement with the cervical traction but did have increased spasm in the shoulder. Re-educated about DN and performed on the L upper trap. Followed DN with IASTM technqiues and rib/ thoracic mobs. utilized MHp post session to calm down DN soreness which she reported relief of pain and tightness.     PT Next Visit Plan  review/ update HEP PRN, Response to DN, soft tissue techniques, cervical mobs, PROM, gentle scapular stability, continue mechanical  traction,     PT Home Exercise Plan  upper trap stretching, levator scap stretch, chin tucks (in supine), upper cervical rotation.     Consulted and Agree with Plan of Care  Patient       Patient will benefit from skilled therapeutic intervention in order to improve the following deficits and impairments:  Pain, Decreased activity tolerance, Postural dysfunction, Improper body mechanics, Hypomobility, Decreased strength, Increased fascial restricitons, Increased muscle spasms, Decreased range of motion, Decreased endurance  Visit Diagnosis: Cervicalgia  Other muscle spasm  Abnormal posture     Problem List Patient Active Problem List   Diagnosis Date Noted  . Menopause syndrome 06/10/2017  . Adverse reaction to vaccine, sequela 03/25/2017  . Atypical chest pain 02/27/2017  . PAC (premature atrial contraction) 02/27/2017  . PVC's (premature ventricular contractions) 02/27/2017  . PSVT (paroxysmal supraventricular tachycardia) ([place]) 11/26/2016  . History of palpitations 11/08/2016  . Nontraumatic incomplete tear of right rotator cuff 07/14/2016  . AC (acromioclavicular) arthritis 07/14/2016  . Impingement syndrome of right shoulder 07/14/2016  . Fibrocystic breast changes, right 05/21/2016  . Menopausal symptoms 10/30/2015  . Insomnia 10/16/2015  . Snoring 07/26/2015  . Migraine without aura and without status migrainosus, not intractable 02/24/2015  . Muscle spasm 02/24/2015  . Facial paresthesia 02/24/2015  . Concussion with loss of consciousness 07/21/2014  . Hyperlipidemia associated with type 2 diabetes mellitus ([place]) 06/13/2014  . Pain in joint, shoulder region 08/11/2013  . Vitamin D deficiency 05/07/2013  . Chronic migraine 11/20/2012  . S/P Total Abdominal Hysterectomy and Left Salpingo-oophorectomy 10/20/2011  . Headache disorder   . Uncontrolled type 2 diabetes mellitus ([place]) 07/20/2011  . Obesity (BMI 30-39.9) 07/20/2011  . Essential hypertension 07/20/2011    Starr Lake PT, DPT, LAT, ATC  07/20/17  5:13 PM       [facility] [address] [place], [place], [address] Phone: [phone]   Fax:  [phone]  Name: MALOREE UPLINGER MRN: [id number] Date of Birth: 05/22/1971

## 2017-07-24 MED FILL — TRULICITY 1.5 MG/0.5 ML PEN: 1.5 | 28 days supply | Qty: 2 | Fill #3

## 2017-07-25 NOTE — Addendum Note (Signed)
Addended by: Adair Laundry on: 07/25/2017 11:24 AM   Modules accepted: Orders

## 2017-07-25 NOTE — Telephone Encounter (Signed)
Orders have been faxed to fax number provided below.

## 2017-07-25 NOTE — Telephone Encounter (Signed)
Spoke with pt to make her aware of this. Pt stated that she would like orders faxed to her work. CMP and lipid panel have been reordered and requisition has been placed in quick sign folder.   Fax #: [phone]   Attn: Alwyn Ren

## 2017-07-27 ENCOUNTER — Encounter: Payer: Self-pay | Admitting: Physician Assistant

## 2017-07-27 ENCOUNTER — Ambulatory Visit (INDEPENDENT_AMBULATORY_CARE_PROVIDER_SITE_OTHER): Payer: Self-pay | Admitting: Family Medicine

## 2017-07-27 ENCOUNTER — Encounter: Payer: Self-pay | Admitting: Family Medicine

## 2017-07-27 VITALS — BP 112/76 | HR 76 | Temp 98.4°F | Resp 18

## 2017-07-27 DIAGNOSIS — Z0189 Encounter for other specified special examinations: Secondary | ICD-10-CM

## 2017-07-27 DIAGNOSIS — E1169 Type 2 diabetes mellitus with other specified complication: Secondary | ICD-10-CM | POA: Diagnosis not present

## 2017-07-27 DIAGNOSIS — I10 Essential (primary) hypertension: Secondary | ICD-10-CM | POA: Diagnosis not present

## 2017-07-27 DIAGNOSIS — E785 Hyperlipidemia, unspecified: Secondary | ICD-10-CM | POA: Diagnosis not present

## 2017-07-27 DIAGNOSIS — Z7689 Persons encountering health services in other specified circumstances: Secondary | ICD-10-CM

## 2017-07-27 MED ORDER — DOXYCYCLINE HYCLATE 100 MG PO CAPS: 100.0000 mg | ORAL_CAPSULE | Freq: Two times a day (BID) | ORAL | 0 refills | Status: DC

## 2017-07-27 NOTE — Progress Notes (Signed)
Subjective:  [name] is a 47 y.o. female who presents for evaluation of with a complaint of a bump on her back. Reports this has been an reoccurring lesion for several years. At present, she was able to express thick white drainage by having someone express the drainage. She reports associated itching and tenderness at the site of the lesion.  The patient denies fever and chills.  ROS Pertinent Negatives Indicated in HPI Objective:  Physical Exam Drained Wound Abscess-Verbal informed consent obtained.  Local anesthesia used 2% with epinephrine. Cleaned with alcohol swab.Prepped with betadine. Incised with # 10,Large amount of purulent exudate mixed with serosanguineous debris was extracted. Sebaceous material removed from woundbed Wound dressed with sterile cause and secured with bandage.    Assessment:  1. Encounter for incision and drainage procedure Plan:  Will treat with a broad-spectrum antibiotic and encourage to cleanse area with soap and water and redress until drainage resolved.  Meds ordered this encounter  Medications  . doxycycline (VIBRAMYCIN) 100 MG capsule    Sig: Take 1 capsule (100 mg total) by mouth 2 (two) times daily.    Dispense:  20 capsule    Refill:  0    Carroll Sage. Kenton Kingfisher, MSN, FNP-C [address] [place]  [place], [place] [address] [phone]

## 2017-07-27 NOTE — Patient Instructions (Signed)
Skin Abscess A skin abscess is an infected area on or under your skin that contains pus and other material. An abscess can happen almost anywhere on your body. Some abscesses break open (rupture) on their own. Most continue to get worse unless they are treated. The infection can spread deeper into the body and into your blood, which can make you feel sick. Treatment usually involves draining the abscess. Follow these instructions at home: Abscess Care  If you have an abscess that has not drained, place a warm, clean, wet washcloth over the abscess several times a day. Do this as told by your doctor.  Follow instructions from your doctor about how to take care of your abscess. Make sure you: ? Cover the abscess with a bandage (dressing). ? Change your bandage or gauze as told by your doctor. ? Wash your hands with soap and water before you change the bandage or gauze. If you cannot use soap and water, use hand sanitizer.  Check your abscess every day for signs that the infection is getting worse. Check for: ? More redness, swelling, or pain. ? More fluid or blood. ? Warmth. ? More pus or a bad smell. Medicines   Take over-the-counter and prescription medicines only as told by your doctor.  If you were prescribed an antibiotic medicine, take it as told by your doctor. Do not stop taking the antibiotic even if you start to feel better. General instructions  To avoid spreading the infection: ? Do not share personal care items, towels, or hot tubs with others. ? Avoid making skin-to-skin contact with other people.  Keep all follow-up visits as told by your doctor. This is important. Contact a doctor if:  You have more redness, swelling, or pain around your abscess.  You have more fluid or blood coming from your abscess.  Your abscess feels warm when you touch it.  You have more pus or a bad smell coming from your abscess.  You have a fever.  Your muscles ache.  You have  chills.  You feel sick. Get help right away if:  You have very bad (severe) pain.  You see red streaks on your skin spreading away from the abscess. This information is not intended to replace advice given to you by your health care provider. Make sure you discuss any questions you have with your health care provider. Document Released: 11/09/2007 Document Revised: 01/17/2016 Document Reviewed: 04/01/2015 Elsevier Interactive Patient Education  2018 Elsevier Inc.  Incision and Drainage, Care After Refer to this sheet in the next few weeks. These instructions provide you with information about caring for yourself after your procedure. Your health care provider may also give you more specific instructions. Your treatment has been planned according to current medical practices, but problems sometimes occur. Call your health care provider if you have any problems or questions after your procedure. What can I expect after the procedure? After the procedure, it is common to have:  Pain or discomfort around your incision site.  Drainage from your incision.  Follow these instructions at home:  Take over-the-counter and prescription medicines only as told by your health care provider.  If you were prescribed an antibiotic medicine, take it as told by your health care provider.Do not stop taking the antibiotic even if you start to feel better.  Followinstructions from your health care provider about: ? How to take care of your incision. ? When and how you should change your packing and bandage (dressing). Wash your   hands with soap and water before you change your dressing. If soap and water are not available, use hand sanitizer. ? When you should remove your dressing.  Do not take baths, swim, or use a hot tub until your health care provider approves.  Keep all follow-up visits as told by your health care provider. This is important.  Check your incision area every day for signs of  infection. Check for: ? More redness, swelling, or pain. ? More fluid or blood. ? Warmth. ? Pus or a bad smell. Contact a health care provider if:  Your cyst or abscess returns.  You have a fever.  You have more redness, swelling, or pain around your incision.  You have more fluid or blood coming from your incision.  Your incision feels warm to the touch.  You have pus or a bad smell coming from your incision. Get help right away if:  You have severe pain or bleeding.  You cannot eat or drink without vomiting.  You have decreased urine output.  You become short of breath.  You have chest pain.  You cough up blood.  The area where the incision and drainage occurred becomes numb or it tingles. This information is not intended to replace advice given to you by your health care provider. Make sure you discuss any questions you have with your health care provider. Document Released: 08/15/2011 Document Revised: 10/23/2015 Document Reviewed: 03/13/2015 Elsevier Interactive Patient Education  2018 Elsevier Inc.  

## 2017-07-28 LAB — COMPREHENSIVE METABOLIC PANEL
ALT: 17 IU/L (ref 0–32)
AST: 16 IU/L (ref 0–40)
Albumin/Globulin Ratio: 1.6 (ref 1.2–2.2)
Albumin: 4.7 g/dL (ref 3.5–5.5)
Alkaline Phosphatase: 137 IU/L — ABNORMAL HIGH (ref 39–117)
BUN/Creatinine Ratio: 18 (ref 9–23)
BUN: 16 mg/dL (ref 6–24)
Bilirubin Total: 0.4 mg/dL (ref 0.0–1.2)
CO2: 23 mmol/L (ref 20–29)
Calcium: 9.6 mg/dL (ref 8.7–10.2)
Chloride: 102 mmol/L (ref 96–106)
Creatinine, Ser: 0.87 mg/dL (ref 0.57–1.00)
GFR calc Af Amer: 92 mL/min/{1.73_m2} (ref >59)
GFR calc non Af Amer: 80 mL/min/{1.73_m2} (ref >59)
Globulin, Total: 2.9 g/dL (ref 1.5–4.5)
Glucose: 165 mg/dL — ABNORMAL HIGH (ref 65–99)
Potassium: 3.9 mmol/L (ref 3.5–5.2)
Sodium: 141 mmol/L (ref 134–144)
Total Protein: 7.6 g/dL (ref 6.0–8.5)

## 2017-07-28 LAB — LIPID PANEL
Chol/HDL Ratio: 4 ratio (ref 0.0–4.4)
Cholesterol, Total: 139 mg/dL (ref 100–199)
HDL: 35 mg/dL — ABNORMAL LOW (ref >39)
LDL Calculated: 68 mg/dL (ref 0–99)
Triglycerides: 181 mg/dL — ABNORMAL HIGH (ref 0–149)
VLDL Cholesterol Cal: 36 mg/dL (ref 5–40)

## 2017-07-30 ENCOUNTER — Other Ambulatory Visit: Payer: Self-pay | Admitting: Internal Medicine

## 2017-07-30 DIAGNOSIS — R748 Abnormal levels of other serum enzymes: Secondary | ICD-10-CM

## 2017-08-01 ENCOUNTER — Ambulatory Visit: Payer: 59 | Admitting: Neurology

## 2017-08-01 ENCOUNTER — Encounter: Payer: Self-pay | Admitting: Neurology

## 2017-08-01 VITALS — BP 107/72 | HR 83 | Ht 66.0 in | Wt 221.0 lb

## 2017-08-01 DIAGNOSIS — G43009 Migraine without aura, not intractable, without status migrainosus: Secondary | ICD-10-CM

## 2017-08-01 MED ORDER — BUTALBITAL-APAP-CAFFEINE 50-325-40 MG PO TABS: 1.0000 | ORAL_TABLET | Freq: Four times a day (QID) | ORAL | 5 refills | Status: DC | PRN

## 2017-08-01 MED ORDER — NORTRIPTYLINE HCL 10 MG PO CAPS
10.0000 mg | ORAL_CAPSULE | Freq: Every day | ORAL | 11 refills | Status: DC
Start: 2017-08-01 — End: 2018-01-23

## 2017-08-01 MED FILL — BUTALB-ACETAMIN-CAFF 50-325: 50-325-40 | 3 days supply | Qty: 12 | Fill #0

## 2017-08-01 MED FILL — NORTRIPTYLINE HCL 10 MG CAP: 10 | 30 days supply | Qty: 30 | Fill #0

## 2017-08-01 NOTE — Patient Instructions (Signed)
Magnesium oxide 400 mg twice a day Riboflavin  100 mg twice a day 
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
## 2017-08-02 ENCOUNTER — Telehealth: Payer: Self-pay

## 2017-08-02 NOTE — Telephone Encounter (Signed)
Called and spoke with pt and pt states that she is doing well and the bump is starting to heal.

## 2017-08-07 ENCOUNTER — Ambulatory Visit: Payer: 59 | Attending: Orthopaedic Surgery | Admitting: Physical Therapy

## 2017-08-07 ENCOUNTER — Other Ambulatory Visit: Payer: Self-pay | Admitting: Internal Medicine

## 2017-08-07 ENCOUNTER — Encounter: Payer: Self-pay | Admitting: Physical Therapy

## 2017-08-07 DIAGNOSIS — M542 Cervicalgia: Secondary | ICD-10-CM | POA: Diagnosis not present

## 2017-08-07 DIAGNOSIS — R293 Abnormal posture: Secondary | ICD-10-CM | POA: Insufficient documentation

## 2017-08-07 DIAGNOSIS — M62838 Other muscle spasm: Secondary | ICD-10-CM | POA: Diagnosis not present

## 2017-08-07 MED FILL — SPIRONOLACTONE 25 MG TABLET: 25 | 30 days supply | Qty: 30 | Fill #1

## 2017-08-07 MED FILL — EMGALITY 120 MG/ML SOAJ: 120 | 30 days supply | Qty: 1 | Fill #2

## 2017-08-07 NOTE — Therapy (Signed)
[place], [place], [address] Phone: [phone]   Fax:  [phone]  Physical Therapy Treatment  Patient Details  Name: [name] MRN: [id number] Date of Birth: 07-10-1970 Referring Provider: Collier Salina whitfield MD   Encounter Date: 08/07/2017  PT End of Session - 08/07/17 1736    Visit Number  5    Number of Visits  13    Date for PT Re-Evaluation  08/08/17    PT Start Time  4193    PT Stop Time  1738    PT Time Calculation (min)  61 min    Activity Tolerance  Patient tolerated treatment well;Patient limited by pain    Behavior During Therapy  [facility] for tasks assessed/performed       Past Medical History:  Diagnosis Date  . Allergy   . Anemia   . Anxiety    claustrophobic  . Asthma   . Diabetes mellitus 2011   did not start metforfin, losing weight  . Headache disorder   . History of concussion   . Hypertension   . Shoulder impingement syndrome, right     Past Surgical History:  Procedure Laterality Date  . ABDOMINAL HYSTERECTOMY  2006   heavy menses, endometriosis, l oophrectomy  . BACK SURGERY    . BREAST BIOPSY Right 2018   benign  . BREAST EXCISIONAL BIOPSY Right 2003   benign  . BREAST EXCISIONAL BIOPSY Right 1999   benign  . BREAST SURGERY     right breast x 2 , benign  . HERNIA REPAIR  2003   left inguinal   . LEFT OOPHORECTOMY    . SHOULDER ARTHROSCOPY WITH SUBACROMIAL DECOMPRESSION AND OPEN ROTATOR C Right 07/14/2016   Procedure: RIGHT SHOULDER ARTHROSCOPY WITH SUBACROMIAL DECOMPRESSION, DISTAL CLAVICLE RESECTION AND MINI OPEN ROTATOR CUFF REPAIR, OPEN BICEP TENDODESIS;  Surgeon: Garald Balding, MD;  Location: [place];  Service: Orthopedics;  Laterality: Right;  . SHOULDER CLOSED REDUCTION Right 09/08/2016   Procedure: RIGHT CLOSED MANIPULATION SHOULDER;  Surgeon: Garald Balding, MD;  Location: [place];  Service: Orthopedics;   Laterality: Right;  . TUBAL LIGATION      There were no vitals filed for this visit.  Subjective Assessment - 08/07/17 1726    Subjective  I slept on my tennis balls. I woke up then put ice on.  I have a migraine for last several days.  No lasting results from PT yet.      Currently in Pain?  Yes    Pain Score  4     Pain Location  Neck    Pain Orientation  Left    Pain Descriptors / Indicators  Spasm;Tightness    Pain Radiating Towards  into left arm    Pain Frequency  Constant    Aggravating Factors   migraine,  reaching with left arm    Pain Relieving Factors  tennis balls,  ice,  meds                      [facility] Adult PT Treatment/Exercise - 08/07/17 0001      Neck Exercises: Seated   Other Seated Exercise  attempted gentle stretches, pain increased      Neck Exercises: Supine   Neck Retraction  5 reps cued for lighter press,  pain increased with these so stoppe    Other Supine Exercise  scapular retraction  with fist squeeze x 3 .  patient noted she was starting to get a spasm. so stopped    Other Supine Exercise  attempted supine scapular stabilization with band patient unable to grip band due to injury right shoulder.       Modalities   Modalities  Electrical Stimulation      Moist Heat Therapy   Number Minutes Moist Heat  15 Minutes    Moist Heat Location  Cervical      Electrical Stimulation   Electrical Stimulation Location  cervical and shoulder    Electrical Stimulation Action  IFC    Electrical Stimulation Parameters  12 it feels good    Electrical Stimulation Goals  Pain      Manual Therapy   Manual Therapy  Soft tissue mobilization    Manual therapy comments  rhomboid, jaw, scalp tender, some not tolerated.     Joint Mobilization  scapular mobs    Soft tissue mobilization  IASTM over the L upper trap    Passive ROM  shoulder flexion post manual, ROM increased    Other Manual Therapy  tender today             PT Education -  08/07/17 1735    Education provided  Yes    Education Details  instrument assist soft tissue work how to ,  can use tennis ball too    Person(s) Educated  Patient    Methods  Explanation;Demonstration    Comprehension  Verbalized understanding       PT Short Term Goals - 06/27/17 1716      PT SHORT TERM GOAL #1   Title  pt to be I with initial HEP    Time  3    Period  Weeks    Status  New    Target Date  07/18/17      PT SHORT TERM GOAL #2   Title  pt to verbalize/ demo proper posture in sitting/ standing and lifting mechanics to prevent and reduce neck pain    Time  3    Period  Weeks    Status  New    Target Date  07/18/17      PT SHORT TERM GOAL #3   Title  pt to decrease upper trap and surrounding muscle spasm to reduce pain to </= 5/10 and promote cervical mobility    Time  3    Period  Weeks    Status  New    Target Date  07/18/17        PT Long Term Goals - 06/27/17 1720      PT LONG TERM GOAL #1   Title  Pt to improve cervical flexion/ ext by >/= 15 degrees and rotaton to >/= 60 with </= 2/10 pain to promote functional mobility for safety with driving.     Time  6    Period  Weeks    Status  New    Target Date  08/08/17      PT LONG TERM GOAL #2   Title  pt be able to sit and perform desk work and ADLs with </= 2/10 pain to promote lite duty work activities and promote return to activity.     Time  6    Period  Weeks    Status  New    Target Date  08/08/17      PT LONG TERM GOAL #3   Title  pt to report decreased incidence of HA to </= 1-2 per week to  demo improving condition    Time  6    Period  Weeks    Status  New    Target Date  08/08/17      PT LONG TERM GOAL #4   Title  Increase FOTO score to </= 32% limited to demo improvement in function     Time  6    Period  Weeks    Status  New    Target Date  08/08/17      PT LONG TERM GOAL #5   Title  pt to be I with all HEP given as of last visit to maintain and progress their current level of  function     Time  6    Period  Weeks    Status  New    Target Date  08/08/17            Plan - 08/07/17 1736    Clinical Impression Statement  Plan changed from gentle stabilization to manual and modalities due to pain today.  She has not noted any lasting improvements with PT so far,  shoulder flexion inproved to about 140 degrees post session on her side.  visually estimated.     PT Next Visit Plan  review/ update HEP PRN, Response to DN, soft tissue techniques, cervical mobs, PROM, gentle scapular stability, continue mechanical traction, Assess IFC    PT Home Exercise Plan  upper trap stretching, levator scap stretch, chin tucks (in supine), upper cervical rotation.     Consulted and Agree with Plan of Care  Patient       Patient will benefit from skilled therapeutic intervention in order to improve the following deficits and impairments:     Visit Diagnosis: Cervicalgia  Other muscle spasm  Abnormal posture     Problem List Patient Active Problem List   Diagnosis Date Noted  . Menopause syndrome 06/10/2017  . Adverse reaction to vaccine, sequela 03/25/2017  . Atypical chest pain 02/27/2017  . PAC (premature atrial contraction) 02/27/2017  . PVC's (premature ventricular contractions) 02/27/2017  . PSVT (paroxysmal supraventricular tachycardia) ([place]) 11/26/2016  . History of palpitations 11/08/2016  . Nontraumatic incomplete tear of right rotator cuff 07/14/2016  . AC (acromioclavicular) arthritis 07/14/2016  . Impingement syndrome of right shoulder 07/14/2016  . Fibrocystic breast changes, right 05/21/2016  . Menopausal symptoms 10/30/2015  . Insomnia 10/16/2015  . Snoring 07/26/2015  . Migraine without aura and without status migrainosus, not intractable 02/24/2015  . Muscle spasm 02/24/2015  . Facial paresthesia 02/24/2015  . Concussion with loss of consciousness 07/21/2014  . Hyperlipidemia associated with type 2 diabetes mellitus ([place]) 06/13/2014  . Pain  in joint, shoulder region 08/11/2013  . Vitamin D deficiency 05/07/2013  . Chronic migraine 11/20/2012  . S/P Total Abdominal Hysterectomy and Left Salpingo-oophorectomy 10/20/2011  . Headache disorder   . Uncontrolled type 2 diabetes mellitus ([place]) 07/20/2011  . Obesity (BMI 30-39.9) 07/20/2011  . Essential hypertension 07/20/2011    [name] PTA 08/07/2017, 5:44 PM  [facility] [address] [place], [place], [address] Phone: [phone]   Fax:  [phone]  Name: [name] MRN: [id number] Date of Birth: [birth year]

## 2017-08-08 ENCOUNTER — Ambulatory Visit: Payer: 59 | Admitting: Physical Therapy

## 2017-08-08 MED FILL — DILTIAZEM ER 240 MG TABLET: 240 | 30 days supply | Qty: 30 | Fill #0

## 2017-08-14 ENCOUNTER — Ambulatory Visit: Payer: 59 | Admitting: Physical Therapy

## 2017-08-16 MED FILL — TRULICITY 1.5 MG/0.5 ML PEN: 1.5 | 28 days supply | Qty: 2 | Fill #4

## 2017-08-17 ENCOUNTER — Telehealth: Payer: Self-pay | Admitting: Neurology

## 2017-08-17 ENCOUNTER — Encounter: Payer: Self-pay | Admitting: Physical Therapy

## 2017-08-17 ENCOUNTER — Ambulatory Visit: Payer: 59 | Admitting: Physical Therapy

## 2017-08-17 DIAGNOSIS — M542 Cervicalgia: Secondary | ICD-10-CM

## 2017-08-17 DIAGNOSIS — M62838 Other muscle spasm: Secondary | ICD-10-CM

## 2017-08-17 DIAGNOSIS — R293 Abnormal posture: Secondary | ICD-10-CM

## 2017-08-17 NOTE — Therapy (Signed)
[place], [place], [address] Phone: [phone]   Fax:  [phone]  Physical Therapy Treatment / Re-certification  Patient Details  Name: [name] MRN: [id number] Date of Birth: 08/31/1970 Referring Provider: Collier Salina whitfield MD   Encounter Date: 08/17/2017  PT End of Session - 08/17/17 1640    Visit Number  6    Number of Visits  13    Date for PT Re-Evaluation  09/14/17    PT Start Time  5027    PT Stop Time  1725    PT Time Calculation (min)  50 min    Activity Tolerance  Patient tolerated treatment well    Behavior During Therapy  [facility] for tasks assessed/performed       Past Medical History:  Diagnosis Date  . Allergy   . Anemia   . Anxiety    claustrophobic  . Asthma   . Diabetes mellitus 2011   did not start metforfin, losing weight  . Headache disorder   . History of concussion   . Hypertension   . Shoulder impingement syndrome, right     Past Surgical History:  Procedure Laterality Date  . ABDOMINAL HYSTERECTOMY  2006   heavy menses, endometriosis, l oophrectomy  . BACK SURGERY    . BREAST BIOPSY Right 2018   benign  . BREAST EXCISIONAL BIOPSY Right 2003   benign  . BREAST EXCISIONAL BIOPSY Right 1999   benign  . BREAST SURGERY     right breast x 2 , benign  . HERNIA REPAIR  2003   left inguinal   . LEFT OOPHORECTOMY    . SHOULDER ARTHROSCOPY WITH SUBACROMIAL DECOMPRESSION AND OPEN ROTATOR C Right 07/14/2016   Procedure: RIGHT SHOULDER ARTHROSCOPY WITH SUBACROMIAL DECOMPRESSION, DISTAL CLAVICLE RESECTION AND MINI OPEN ROTATOR CUFF REPAIR, OPEN BICEP TENDODESIS;  Surgeon: Garald Balding, MD;  Location: [place];  Service: Orthopedics;  Laterality: Right;  . SHOULDER CLOSED REDUCTION Right 09/08/2016   Procedure: RIGHT CLOSED MANIPULATION SHOULDER;  Surgeon: Garald Balding, MD;  Location: [place];  Service: Orthopedics;  Laterality:  Right;  . TUBAL LIGATION      There were no vitals filed for this visit.  Subjective Assessment - 08/17/17 1634    Subjective  "Today I went to my first PT session for workers comp shoulder. I was driving to work and I felt continued spasm in the neck which I get form time to time"     Patient Stated Goals  to improve neck motion and reduce.     Currently in Pain?  Yes    Pain Score  0-No pain last took medication at 11:30, and ibuprofen at 1pm    Pain Location  Neck    Pain Orientation  Left    Pain Descriptors / Indicators  Aching;Spasm    Pain Type  Chronic pain    Pain Onset  More than a month ago    Pain Frequency  Intermittent    Aggravating Factors   looking to the L, unsure exactly what causes the spasm     Pain Relieving Factors  tennis balls, ice, meds         [facility] PT Assessment - 08/17/17 1644      Observation/Other Assessments   Focus on Therapeutic Outcomes (FOTO)   47% limited      AROM   Cervical Flexion  60    Cervical Extension  32  Cervical - Right Side Bend  30    Cervical - Left Side Bend  10    Cervical - Right Rotation  46 ERP    Cervical - Left Rotation  37 ERP                  [facility] Adult PT Treatment/Exercise - 08/17/17 1653      Neck Exercises: Supine   Neck Retraction  10 reps;5 secs    Other Supine Exercise  scapular retraction  with fist squeeze x 3 .  patient noted she was starting to get a spasm. so stopped      Moist Heat Therapy   Number Minutes Moist Heat  10 Minutes    Moist Heat Location  Cervical supine      Electrical Stimulation   Electrical Stimulation Location  cervical and shoulder    Electrical Stimulation Action  IFC    Electrical Stimulation Parameters  100% scan L13 x 10 min    Electrical Stimulation Goals  Pain      Manual Therapy   Manual therapy comments  skilled palpation and monitoring throughout TPDN    Joint Mobilization  PA T1-T8 grade 3    Soft tissue mobilization  IASTM over the L upper trap/  levator scapulae, and sub-occipitals      Neck Exercises: Stretches   Upper Trapezius Stretch  Left;2 reps;30 seconds       Trigger Point Dry Needling - 08/17/17 1652    Consent Given?  Yes    Education Handout Provided  No    Muscles Treated Upper Body  Upper trapezius;Suboccipitals muscle group    Upper Trapezius Response  Twitch reponse elicited;Palpable increased muscle length L only    SubOccipitals Response  Twitch response elicited;Palpable increased muscle length L only             PT Short Term Goals - 08/17/17 1655      PT SHORT TERM GOAL #1   Title  pt to be I with initial HEP    Time  3    Period  Weeks    Status  Achieved      PT SHORT TERM GOAL #2   Title  pt to verbalize/ demo proper posture in sitting/ standing and lifting mechanics to prevent and reduce neck pain    Time  3    Period  Weeks    Status  On-going      PT SHORT TERM GOAL #3   Title  pt to decrease upper trap and surrounding muscle spasm to reduce pain to </= 5/10 and promote cervical mobility    Time  3    Period  Weeks    Status  Achieved        PT Long Term Goals - 08/17/17 1655      PT LONG TERM GOAL #1   Title  Pt to improve cervical flexion/ ext by >/= 15 degrees and rotaton to >/= 60 with </= 2/10 pain to promote functional mobility for safety with driving.     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     Time  4    Period  Weeks    Status  On-going    Target Date  09/14/17      PT LONG TERM GOAL #3   Title  pt to report decreased incidence of HA to </= 1-2 per week to demo improving condition    Time  4    Period  Weeks    Status  On-going    Target Date  09/14/17      PT LONG TERM GOAL #4   Title  Increase FOTO score to </= 32% limited to demo improvement in  function     Time  4    Status  On-going    Target Date  09/14/17            Plan - 08/17/17 1719    Clinical Impression Statement  pt reports she just started PT for her R shoulder at [facility] and is feeling soreness today. she is progressing with cervical flexion/ extension but continues to demo limited rotation and sidebending. Continued TPDN at the L sub-occipitals and upper trap followed with IASTM and thoracic mobs. she met all STG except for #2, and is progressing with LTG. continued E-stim and MHP end of session to calm down soreness. she would benefit from continued physical therapy to decrease L shoulder / neck muscle spasm, improve cervical and shoulder mobility, and maximize her function by addressing the deficits listed.     PT Next Visit Plan  review/ update HEP PRN, Response to DN, soft tissue techniques, cervical mobs, PROM, gentle scapular stability, continue mechanical traction, e-stim PRN for pain     PT Home Exercise Plan  upper trap stretching, levator scap stretch, chin tucks (in supine), upper cervical rotation.     Consulted and Agree with Plan of Care  Patient       Patient will benefit from skilled therapeutic intervention in order to improve the following deficits and impairments:  Pain, Decreased activity tolerance, Postural dysfunction, Improper body mechanics, Hypomobility, Decreased strength, Increased fascial restricitons, Increased muscle spasms, Decreased range of motion, Decreased endurance  Visit Diagnosis: Cervicalgia  Other muscle spasm  Abnormal posture     Problem List Patient Active Problem List   Diagnosis Date Noted  . Menopause syndrome 06/10/2017  . Adverse reaction to vaccine, sequela 03/25/2017  . Atypical chest pain 02/27/2017  . PAC (premature atrial contraction) 02/27/2017  . PVC's (premature ventricular contractions) 02/27/2017  . PSVT (paroxysmal supraventricular tachycardia) ([place]) 11/26/2016  . History of  palpitations 11/08/2016  . Nontraumatic incomplete tear of right rotator cuff 07/14/2016  . AC (acromioclavicular) arthritis 07/14/2016  . Impingement syndrome of right shoulder 07/14/2016  . Fibrocystic breast changes, right 05/21/2016  . Menopausal symptoms 10/30/2015  . Insomnia 10/16/2015  . Snoring 07/26/2015  . Migraine without aura and without status migrainosus, not intractable 02/24/2015  . Muscle spasm 02/24/2015  . Facial paresthesia 02/24/2015  . Concussion with loss of consciousness 07/21/2014  . Hyperlipidemia associated with type 2 diabetes mellitus ([place]) 06/13/2014  . Pain in joint, shoulder region 08/11/2013  . Vitamin D deficiency 05/07/2013  . Chronic migraine 11/20/2012  . S/P Total Abdominal Hysterectomy and Left Salpingo-oophorectomy 10/20/2011  . Headache disorder   . Uncontrolled type 2 diabetes mellitus ([place]) 07/20/2011  . Obesity (BMI 30-39.9) 07/20/2011  . Essential hypertension 07/20/2011   Starr Lake PT, DPT, LAT, ATC  08/17/17  5:31 PM      [place] [facility] [address] [place], [place], [address] Phone: [phone]  Fax:  [phone]  Name: [name] MRN: [id number] Date of Birth: 11-22-1970

## 2017-08-17 NOTE — Telephone Encounter (Signed)
Called patient to reschedule 11/09/17 appointment due to Dr. Krista Blue being out of the office. Patient requested to see another provider. Patient states she felt like Dr. Krista Blue was talking over her and that she felt like she was ''wasting her time.''

## 2017-08-17 NOTE — Telephone Encounter (Signed)
Botox was sent in error

## 2017-08-17 NOTE — Telephone Encounter (Signed)
Pt. Needs 12 wk Botox.  °

## 2017-08-17 NOTE — Telephone Encounter (Signed)
[name] is working on getting this patient transitioned to another provider.

## 2017-08-17 NOTE — Telephone Encounter (Signed)
I would schedule her with Hoyle Sauer or Jinny Blossom. Since she has already seen a provider here, she can transition to an NP within the practice. thanks

## 2017-08-17 NOTE — Telephone Encounter (Signed)
Called patient to reschedule 11/09/17 appointment due to Dr. Krista Blue being out of the office. Patient requested to see another provider. Patient states she felt like Dr. Krista Blue was talking over her and that she felt like she was "wasting her time." I talked with Dr. Rhea Belton RN and she states Dr. Krista Blue has approved to let the patient see another provider. The patient was seeing Dr. Krista Blue for migraines, would like to know if you would be open to accepting to see this patient?

## 2017-08-17 NOTE — Telephone Encounter (Signed)
Error

## 2017-08-17 NOTE — Telephone Encounter (Signed)
Noted  

## 2017-08-18 MED FILL — METOPROLOL TARTRATE 25 MG T: 25 | 90 days supply | Qty: 45 | Fill #2

## 2017-08-21 ENCOUNTER — Ambulatory Visit: Payer: 59 | Admitting: Physical Therapy

## 2017-08-21 ENCOUNTER — Encounter: Payer: Self-pay | Admitting: Physical Therapy

## 2017-08-21 DIAGNOSIS — R293 Abnormal posture: Secondary | ICD-10-CM

## 2017-08-21 DIAGNOSIS — M542 Cervicalgia: Secondary | ICD-10-CM

## 2017-08-21 DIAGNOSIS — M62838 Other muscle spasm: Secondary | ICD-10-CM

## 2017-08-21 NOTE — Therapy (Signed)
[place], [place], [address] Phone: [phone]   Fax:  [phone]  Physical Therapy Treatment  Patient Details  Name: [name] MRN: [id number] Date of Birth: [birth year] Referring Provider: Collier Salina whitfield MD   Encounter Date: 08/21/2017  PT End of Session - 08/21/17 1739    Visit Number  7    Number of Visits  13    PT Start Time  6962    PT Stop Time  1725    PT Time Calculation (min)  47 min    Activity Tolerance  Patient tolerated treatment well    Behavior During Therapy  [facility] for tasks assessed/performed       Past Medical History:  Diagnosis Date  . Allergy   . Anemia   . Anxiety    claustrophobic  . Asthma   . Diabetes mellitus 2011   did not start metforfin, losing weight  . Headache disorder   . History of concussion   . Hypertension   . Shoulder impingement syndrome, right     Past Surgical History:  Procedure Laterality Date  . ABDOMINAL HYSTERECTOMY  2006   heavy menses, endometriosis, l oophrectomy  . BACK SURGERY    . BREAST BIOPSY Right 2018   benign  . BREAST EXCISIONAL BIOPSY Right 2003   benign  . BREAST EXCISIONAL BIOPSY Right 1999   benign  . BREAST SURGERY     right breast x 2 , benign  . HERNIA REPAIR  2003   left inguinal   . LEFT OOPHORECTOMY    . SHOULDER ARTHROSCOPY WITH SUBACROMIAL DECOMPRESSION AND OPEN ROTATOR C Right 07/14/2016   Procedure: RIGHT SHOULDER ARTHROSCOPY WITH SUBACROMIAL DECOMPRESSION, DISTAL CLAVICLE RESECTION AND MINI OPEN ROTATOR CUFF REPAIR, OPEN BICEP TENDODESIS;  Surgeon: Garald Balding, MD;  Location: [place];  Service: Orthopedics;  Laterality: Right;  . SHOULDER CLOSED REDUCTION Right 09/08/2016   Procedure: RIGHT CLOSED MANIPULATION SHOULDER;  Surgeon: Garald Balding, MD;  Location: [place];  Service: Orthopedics;  Laterality: Right;  . TUBAL LIGATION      There were no vitals filed  for this visit.  Subjective Assessment - 08/21/17 1641    Subjective  Saw workman's comp.  DN helps it to looses then it tightens up again when i get in the car and drive,    i hav e had a 3 week  headach  if it lasts to thursday.      Currently in Pain?  Yes    Pain Score  6     Pain Location  Neck    Pain Orientation  Left    Pain Descriptors / Indicators  Aching;Spasm;Headache;Burning deep in scoket behind eyes.     Pain Type  Chronic pain    Pain Radiating Towards  into left arm    Pain Frequency  Intermittent    Aggravating Factors   driving longer,      Pain Relieving Factors  tennis balls,  ice,  meds    Multiple Pain Sites  -- right bivceps tendon  7-8/10                      [facility] Adult PT Treatment/Exercise - 08/21/17 0001      Neck Exercises: Supine   Neck Retraction  5 reps    Cervical Rotation  5 reps painful in right shoulder,  cued for smaller motions    Other Supine  Exercise  scapular retraction with fist squeeze.   neck retraction  with tongue to roof of mouth and look overhead with  eyes.         Shoulder Exercises: Supine   External Rotation  10 reps 2 sets AROM and single arm ER red band  PTA holding    External Rotation Limitations  Shoulder locks with band ,  will do     Diagonals  10 reps;AAROM left    Other Supine Exercises  light resistance at elbow shoulder depression 10 X    Other Supine Exercises  rhythmic stabilization 2 passes from shoulder to wrist.      Moist Heat Therapy   Number Minutes Moist Heat  5 Minutes    Moist Heat Location  Cervical short time ,  patient had to pick up child and etc.      Electrical Stimulation   Electrical Stimulation Goals  -- started but stopped due to patient had to leave.  No charge      Manual Therapy   Soft tissue mobilization  IASTM over the L upper trap/ levator scapulae, and sub-occipitals tissue softened             PT Education - 08/21/17 1738    Education provided  Yes     Education Details  exercise technique    Person(s) Educated  Patient    Methods  Explanation;Demonstration;Tactile cues;Verbal cues    Comprehension  Verbalized understanding;Returned demonstration       PT Short Term Goals - 08/17/17 1655      PT SHORT TERM GOAL #1   Title  pt to be I with initial HEP    Time  3    Period  Weeks    Status  Achieved      PT SHORT TERM GOAL #2   Title  pt to verbalize/ demo proper posture in sitting/ standing and lifting mechanics to prevent and reduce neck pain    Time  3    Period  Weeks    Status  On-going      PT SHORT TERM GOAL #3   Title  pt to decrease upper trap and surrounding muscle spasm to reduce pain to </= 5/10 and promote cervical mobility    Time  3    Period  Weeks    Status  Achieved        PT Long Term Goals - 08/17/17 1655      PT LONG TERM GOAL #1   Title  Pt to improve cervical flexion/ ext by >/= 15 degrees and rotaton to >/= 60 with </= 2/10 pain to promote functional mobility for safety with driving.     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     Time  4    Period  Weeks    Status  On-going    Target Date  09/14/17      PT LONG TERM GOAL #3   Title  pt to report decreased incidence of HA to </= 1-2 per week to demo improving condition    Time  4    Period  Weeks    Status  On-going  Target Date  09/14/17      PT LONG TERM GOAL #4   Title  Increase FOTO score to </= 32% limited to demo improvement in function     Time  4    Status  On-going    Target Date  09/14/17            Plan - 08/21/17 1739    Clinical Impression Statement  shoulder 126 flexion in supine.   Pain limits some exercise.   pain 2/10 at end of session.  i was not able to progress her HEP due to pain.      PT Next Visit Plan   review/ update HEP PRN, Response to DN, soft tissue techniques, cervical mobs, PROM, gentle scapular stability, continue mechanical traction, e-stim PRN for pain     PT Home Exercise Plan  upper trap stretching, levator scap stretch, chin tucks (in supine), upper cervical rotation.     Consulted and Agree with Plan of Care  Patient       Patient will benefit from skilled therapeutic intervention in order to improve the following deficits and impairments:     Visit Diagnosis: Cervicalgia  Other muscle spasm  Abnormal posture     Problem List Patient Active Problem List   Diagnosis Date Noted  . Menopause syndrome 06/10/2017  . Adverse reaction to vaccine, sequela 03/25/2017  . Atypical chest pain 02/27/2017  . PAC (premature atrial contraction) 02/27/2017  . PVC's (premature ventricular contractions) 02/27/2017  . PSVT (paroxysmal supraventricular tachycardia) ([place]) 11/26/2016  . History of palpitations 11/08/2016  . Nontraumatic incomplete tear of right rotator cuff 07/14/2016  . AC (acromioclavicular) arthritis 07/14/2016  . Impingement syndrome of right shoulder 07/14/2016  . Fibrocystic breast changes, right 05/21/2016  . Menopausal symptoms 10/30/2015  . Insomnia 10/16/2015  . Snoring 07/26/2015  . Migraine without aura and without status migrainosus, not intractable 02/24/2015  . Muscle spasm 02/24/2015  . Facial paresthesia 02/24/2015  . Concussion with loss of consciousness 07/21/2014  . Hyperlipidemia associated with type 2 diabetes mellitus ([place]) 06/13/2014  . Pain in joint, shoulder region 08/11/2013  . Vitamin D deficiency 05/07/2013  . Chronic migraine 11/20/2012  . S/P Total Abdominal Hysterectomy and Left Salpingo-oophorectomy 10/20/2011  . Headache disorder   . Uncontrolled type 2 diabetes mellitus ([place]) 07/20/2011  . Obesity (BMI 30-39.9) 07/20/2011  . Essential hypertension 07/20/2011    HARRIS,KAREN PTA 08/21/2017, 5:43 PM  [facility] [address] [place], [place], [address] Phone: [phone]   Fax:  [phone]  Name: [name] MRN: [id number] Date of Birth: Sep 21, 1970

## 2017-08-24 ENCOUNTER — Encounter: Payer: Self-pay | Admitting: Physical Therapy

## 2017-08-24 ENCOUNTER — Ambulatory Visit: Payer: 59 | Admitting: Physical Therapy

## 2017-08-24 DIAGNOSIS — M542 Cervicalgia: Secondary | ICD-10-CM

## 2017-08-24 DIAGNOSIS — R293 Abnormal posture: Secondary | ICD-10-CM | POA: Diagnosis not present

## 2017-08-24 DIAGNOSIS — M62838 Other muscle spasm: Secondary | ICD-10-CM

## 2017-08-24 DIAGNOSIS — M25511 Pain in right shoulder: Secondary | ICD-10-CM | POA: Diagnosis not present

## 2017-08-24 NOTE — Therapy (Signed)
[place], [place], [address] Phone: [phone]   Fax:  [phone]  Physical Therapy Treatment  Patient Details  Name: [name] MRN: [id number] Date of Birth: [birth year] Referring Provider: Collier Salina whitfield MD   Encounter Date: 08/24/2017  PT End of Session - 08/24/17 1638    Visit Number  8    Number of Visits  13    Date for PT Re-Evaluation  09/14/17    PT Start Time  5093 pt arrived 8 minutes late.    PT Stop Time  1708 pt had to leave early.     PT Time Calculation (min)  30 min    Activity Tolerance  Patient tolerated treatment well    Behavior During Therapy  WFL for tasks assessed/performed       Past Medical History:  Diagnosis Date  . Allergy   . Anemia   . Anxiety    claustrophobic  . Asthma   . Diabetes mellitus 2011   did not start metforfin, losing weight  . Headache disorder   . History of concussion   . Hypertension   . Shoulder impingement syndrome, right     Past Surgical History:  Procedure Laterality Date  . ABDOMINAL HYSTERECTOMY  2006   heavy menses, endometriosis, l oophrectomy  . BACK SURGERY    . BREAST BIOPSY Right 2018   benign  . BREAST EXCISIONAL BIOPSY Right 2003   benign  . BREAST EXCISIONAL BIOPSY Right 1999   benign  . BREAST SURGERY     right breast x 2 , benign  . HERNIA REPAIR  2003   left inguinal   . LEFT OOPHORECTOMY    . SHOULDER ARTHROSCOPY WITH SUBACROMIAL DECOMPRESSION AND OPEN ROTATOR C Right 07/14/2016   Procedure: RIGHT SHOULDER ARTHROSCOPY WITH SUBACROMIAL DECOMPRESSION, DISTAL CLAVICLE RESECTION AND MINI OPEN ROTATOR CUFF REPAIR, OPEN BICEP TENDODESIS;  Surgeon: Garald Balding, MD;  Location: [place];  Service: Orthopedics;  Laterality: Right;  . SHOULDER CLOSED REDUCTION Right 09/08/2016   Procedure: RIGHT CLOSED MANIPULATION SHOULDER;  Surgeon: Garald Balding, MD;  Location: [place];   Service: Orthopedics;  Laterality: Right;  . TUBAL LIGATION      There were no vitals filed for this visit.  Subjective Assessment - 08/24/17 1641    Subjective  "I have to leave early, I was doing good until I had to go to therapy for my R shoulder"     Currently in Pain?  Yes    Pain Score  5     Pain Location  Neck    Pain Orientation  Left    Pain Descriptors / Indicators  Aching;Spasm;Sore    Pain Type  Chronic pain    Pain Onset  More than a month ago    Pain Frequency  Intermittent                      [facility] Adult PT Treatment/Exercise - 08/24/17 1648      Self-Care   Self-Care  Other Self-Care Comments    Other Self-Care Comments   how to perform MTPR using the[employer]ane and where she can find a tool      Shoulder Exercises: Standing   Extension  Left;12 reps;Theraband    Theraband Level (Shoulder Extension)  Level 2 (Red) x 2 sets    Row  Left;15 reps;Theraband x 2 sets    Theraband Level (Shoulder Row)  Level 2 (Red)      Manual Therapy   Manual therapy comments  MTPR along L upper trap / rhomboids.              PT Education - 08/24/17 1712    Education provided  Yes    Education Details  MTPR techniques    Person(s) Educated  Patient    Methods  Explanation;Verbal cues    Comprehension  Verbalized understanding;Verbal cues required       PT Short Term Goals - 08/17/17 1655      PT SHORT TERM GOAL #1   Title  pt to be I with initial HEP    Time  3    Period  Weeks    Status  Achieved      PT SHORT TERM GOAL #2   Title  pt to verbalize/ demo proper posture in sitting/ standing and lifting mechanics to prevent and reduce neck pain    Time  3    Period  Weeks    Status  On-going      PT SHORT TERM GOAL #3   Title  pt to decrease upper trap and surrounding muscle spasm to reduce pain to </= 5/10 and promote cervical mobility    Time  3    Period  Weeks    Status  Achieved        PT Long Term Goals - 08/17/17 1655      PT LONG  TERM GOAL #1   Title  Pt to improve cervical flexion/ ext by >/= 15 degrees and rotaton to >/= 60 with </= 2/10 pain to promote functional mobility for safety with driving.     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     Time  4    Period  Weeks    Status  On-going    Target Date  09/14/17      PT LONG TERM GOAL #3   Title  pt to report decreased incidence of HA to </= 1-2 per week to demo improving condition    Time  4    Period  Weeks    Status  On-going    Target Date  09/14/17      PT LONG TERM GOAL #4   Title  Increase FOTO score to </= 32% limited to demo improvement in function     Time  4    Status  On-going    Target Date  09/14/17            Plan - 08/24/17 1715    Clinical Impression Statement  pt arrived late and reported she had to leave early. educated about MTPR using tools. due to short period of time utilized time for L shoulder strengthening which she reported decreased pain in the shoulder /neck end of session.     PT Next Visit Plan  review/ update HEP PRN, Response to DN, soft tissue techniques, cervical mobs, PROM, gentle scapular stability, continue mechanical traction, e-stim PRN for pain     PT Home Exercise Plan  upper trap stretching, levator scap stretch, chin tucks (in supine), upper cervical rotation.  Consulted and Agree with Plan of Care  Patient       Patient will benefit from skilled therapeutic intervention in order to improve the following deficits and impairments:  Pain, Decreased activity tolerance, Postural dysfunction, Improper body mechanics, Hypomobility, Decreased strength, Increased fascial restricitons, Increased muscle spasms, Decreased range of motion, Decreased endurance  Visit Diagnosis: Cervicalgia  Other muscle  spasm  Abnormal posture     Problem List Patient Active Problem List   Diagnosis Date Noted  . Menopause syndrome 06/10/2017  . Adverse reaction to vaccine, sequela 03/25/2017  . Atypical chest pain 02/27/2017  . PAC (premature atrial contraction) 02/27/2017  . PVC's (premature ventricular contractions) 02/27/2017  . PSVT (paroxysmal supraventricular tachycardia) ([place]) 11/26/2016  . History of palpitations 11/08/2016  . Nontraumatic incomplete tear of right rotator cuff 07/14/2016  . AC (acromioclavicular) arthritis 07/14/2016  . Impingement syndrome of right shoulder 07/14/2016  . Fibrocystic breast changes, right 05/21/2016  . Menopausal symptoms 10/30/2015  . Insomnia 10/16/2015  . Snoring 07/26/2015  . Migraine without aura and without status migrainosus, not intractable 02/24/2015  . Muscle spasm 02/24/2015  . Facial paresthesia 02/24/2015  . Concussion with loss of consciousness 07/21/2014  . Hyperlipidemia associated with type 2 diabetes mellitus ([place]) 06/13/2014  . Pain in joint, shoulder region 08/11/2013  . Vitamin D deficiency 05/07/2013  . Chronic migraine 11/20/2012  . S/P Total Abdominal Hysterectomy and Left Salpingo-oophorectomy 10/20/2011  . Headache disorder   . Uncontrolled type 2 diabetes mellitus ([place]) 07/20/2011  . Obesity (BMI 30-39.9) 07/20/2011  . Essential hypertension 07/20/2011   Starr [place] PT, DPT, LAT, ATC  08/24/17  5:18 PM      [facility] [address] [place], [place], [address] Phone: [phone]   Fax:  [phone]  Name: [name] MRN: [id number] Date of Birth: [birth year]

## 2017-08-28 ENCOUNTER — Encounter: Payer: Self-pay | Admitting: Physical Therapy

## 2017-08-28 ENCOUNTER — Ambulatory Visit: Payer: 59 | Admitting: Physical Therapy

## 2017-08-28 DIAGNOSIS — M62838 Other muscle spasm: Secondary | ICD-10-CM

## 2017-08-28 DIAGNOSIS — R293 Abnormal posture: Secondary | ICD-10-CM

## 2017-08-28 DIAGNOSIS — M542 Cervicalgia: Secondary | ICD-10-CM

## 2017-08-28 NOTE — Patient Instructions (Addendum)
Internal Rotation (Eccentric), (Resistance Band)    Hold band with hand of affected arm. Quickly pull band inward. Keep wrist neutral. Slowly return for 3-5 seconds. Use __red______ resistance band. Hint: Use towel roll between elbow and waist or elbow and hip. _10__ reps per set, __1_ sets per day, __5_ days per week.  http://ecce.exer.us/211   Copyright  VHI. All rights reserved.  EXTERNAL ROTATION: Standing - Stable: Exercise Band (Active)    Stand, right arm bent to 90, elbow against side, hand forward. Against red resistance band, rotate forearm outward, keeping elbow at side. Rotate forearm outward as far as possible. Complete __1_ sets of _10repetitions. Perform _1__ sessions per day.   May do in bed with help from daughter.    Copyright  VHI. All rights reserved.  Sleeping on Back  Place pillow under knees. A pillow with cervical support and a roll around waist are also helpful. Copyright  VHI. All rights reserved.  Sleeping on Side Place pillow between knees. Use cervical support under neck and a roll around waist as needed. Copyright  VHI. All rights reserved.   Sleeping on Stomach   If this is the only desirable sleeping position, place pillow under lower legs, and under stomach or chest as needed.  Posture - Sitting   Sit upright, head facing forward. Try using a roll to support lower back. Keep shoulders relaxed, and avoid rounded back. Keep hips level with knees. Avoid crossing legs for long periods. Stand to Sit / Sit to Stand   To sit: Bend knees to lower self onto front edge of chair, then scoot back on seat. To stand: Reverse sequence by placing one foot forward, and scoot to front of seat. Use rocking motion to stand up.   Work Height and Reach  Ideal work height is no more than 2 to 4 inches below elbow level when standing, and at elbow level when sitting. Reaching should be limited to arm's length, with elbows slightly bent.  Bending  Bend at  hips and knees, not back. Keep feet shoulder-width apart.    Posture - Standing   Good posture is important. Avoid slouching and forward head thrust. Maintain curve in low back and align ears over shoul- ders, hips over ankles.  Alternating Positions   Alternate tasks and change positions frequently to reduce fatigue and muscle tension. Take rest breaks. Computer Work   Position work to Programmer, multimedia. Use proper work and seat height. Keep shoulders back and down, wrists straight, and elbows at right angles. Use chair that provides full back support. Add footrest and lumbar roll as needed.  Getting Into / Out of Car  Lower self onto seat, scoot back, then bring in one leg at a time. Reverse sequence to get out.  Dressing  Lie on back to pull socks or slacks over feet, or sit and bend leg while keeping back straight.    Housework - Sink  Place one foot on ledge of cabinet under sink when standing at sink for prolonged periods.   Pushing / Pulling  Pushing is preferable to pulling. Keep back in proper alignment, and use leg muscles to do the work.  Deep Squat   Squat and lift with both arms held against upper trunk. Tighten stomach muscles without holding breath. Use smooth movements to avoid jerking.  Avoid Twisting   Avoid twisting or bending back. Pivot around using foot movements, and bend at knees if needed when reaching for articles.  Carrying Luggage  Distribute weight evenly on both sides. Use a cart whenever possible. Do not twist trunk. Move body as a unit.   Lifting Principles .Maintain proper posture and head alignment. .Slide object as close as possible before lifting. .Move obstacles out of the way. .Test before lifting; ask for help if too heavy. .Tighten stomach muscles without holding breath. .Use smooth movements; do not jerk. .Use legs to do the work, and pivot with feet. .Distribute the work load symmetrically and close to the center of  trunk. .Push instead of pull whenever possible.   Ask For Help   Ask for help and delegate to others when possible. Coordinate your movements when lifting together, and maintain the low back curve.  Log Roll   Lying on back, bend left knee and place left arm across chest. Roll all in one movement to the right. Reverse to roll to the left. Always move as one unit. Housework - Sweeping  Use long-handled equipment to avoid stooping.   Housework - Wiping  Position yourself as close as possible to reach work surface. Avoid straining your back.  Laundry - Unloading Wash   To unload small items at bottom of washer, lift leg opposite to arm being used to reach.  [place] close to area to be raked. Use arm movements to do the work. Keep back straight and avoid twisting.     Cart  When reaching into cart with one arm, lift opposite leg to keep back straight.   Getting Into / Out of Bed  Lower self to lie down on one side by raising legs and lowering head at the same time. Use arms to assist moving without twisting. Bend both knees to roll onto back if desired. To sit up, start from lying on side, and use same move-ments in reverse. Housework - Vacuuming  Hold the vacuum with arm held at side. Step back and forth to move it, keeping head up. Avoid twisting.   Laundry - IT consultant so that bending and twisting can be avoided.   Laundry - Unloading Dryer  Squat down to reach into clothes dryer or use a reacher.  Gardening - Weeding / Probation officer or Kneel. Knee pads may be helpful.

## 2017-08-28 NOTE — Therapy (Signed)
[place], [place], [address] Phone: [phone]   Fax:  [phone]  Physical Therapy Treatment  Patient Details  Name: [name] MRN: [id number] Date of Birth: November 16, 1970 Referring Provider: Collier Salina whitfield MD   Encounter Date: 08/28/2017  PT End of Session - 08/28/17 1744    Visit Number  9    Number of Visits  13    Date for PT Re-Evaluation  09/14/17    PT Start Time  5366    PT Stop Time  1735    PT Time Calculation (min)  51 min    Activity Tolerance  Patient tolerated treatment well;Patient limited by pain    Behavior During Therapy  [facility] for tasks assessed/performed       Past Medical History:  Diagnosis Date  . Allergy   . Anemia   . Anxiety    claustrophobic  . Asthma   . Diabetes mellitus 2011   did not start metforfin, losing weight  . Headache disorder   . History of concussion   . Hypertension   . Shoulder impingement syndrome, right     Past Surgical History:  Procedure Laterality Date  . ABDOMINAL HYSTERECTOMY  2006   heavy menses, endometriosis, l oophrectomy  . BACK SURGERY    . BREAST BIOPSY Right 2018   benign  . BREAST EXCISIONAL BIOPSY Right 2003   benign  . BREAST EXCISIONAL BIOPSY Right 1999   benign  . BREAST SURGERY     right breast x 2 , benign  . HERNIA REPAIR  2003   left inguinal   . LEFT OOPHORECTOMY    . SHOULDER ARTHROSCOPY WITH SUBACROMIAL DECOMPRESSION AND OPEN ROTATOR C Right 07/14/2016   Procedure: RIGHT SHOULDER ARTHROSCOPY WITH SUBACROMIAL DECOMPRESSION, DISTAL CLAVICLE RESECTION AND MINI OPEN ROTATOR CUFF REPAIR, OPEN BICEP TENDODESIS;  Surgeon: Garald Balding, MD;  Location: [place];  Service: Orthopedics;  Laterality: Right;  . SHOULDER CLOSED REDUCTION Right 09/08/2016   Procedure: RIGHT CLOSED MANIPULATION SHOULDER;  Surgeon: Garald Balding, MD;  Location: [place];  Service: Orthopedics;   Laterality: Right;  . TUBAL LIGATION      There were no vitals filed for this visit.  Subjective Assessment - 08/28/17 1644    Subjective  I still have migraines.  Pain 3/10  after pain meds.  Pain made it hard to focus at work.      Pain Orientation  Left    Pain Descriptors / Indicators  Aching;Sore;Tightness    Pain Radiating Towards  from back of head near ear to shoulder.      Aggravating Factors   Driving longer,  working.  Looking down at the compulter.      Pain Relieving Factors  ice,  massager.  holding head in place.                  No data recorded       [facility] Adult PT Treatment/Exercise - 08/28/17 0001      Self-Care   Other Self-Care Comments   ADL handout briefly reviewed.      Neck Exercises: Supine   Neck Retraction  5 reps with long arm fist squeeze      Shoulder Exercises: Supine   External Rotation  10 reps    External Rotation Limitations  red bend    Internal Rotation  10 reps red band    Other Supine Exercises   row  left  10 X red band,  cues initially    Other Supine Exercises  isometrics 10 x  5 seconds abd, extension and abduction.  left shoulder      Cryotherapy   Number Minutes Cryotherapy  5 Minutes    Cryotherapy Location  Cervical    Type of Cryotherapy  -- cold pack  removed due to spasm      Manual Therapy   Soft tissue mobilization  trigger point releases left lateral neck light pressure pain increased to spasm even with light pressure.    Manual Traction  unable to tolerate hands on head for this      Neck Exercises: Stretches   Levator Stretch  1 rep;3 reps    Neck Stretch  1 rep;30 seconds             PT Education - 08/28/17 1712    Education provided  Yes    Education Details  HEP,  ADL self care    Person(s) Educated  Patient    Methods  Explanation;Demonstration;Verbal cues;Handout    Comprehension  Verbalized understanding;Returned demonstration       PT Short Term Goals - 08/28/17 1750      PT  SHORT TERM GOAL #1   Title  pt to be I with initial HEP    Time  3    Period  Weeks    Status  Achieved      PT SHORT TERM GOAL #2   Title  pt to verbalize/ demo proper posture in sitting/ standing and lifting mechanics to prevent and reduce neck pain    Baseline  education today for this .     Time  3    Period  Weeks    Status  On-going      PT SHORT TERM GOAL #3   Title  pt to decrease upper trap and surrounding muscle spasm to reduce pain to </= 5/10 and promote cervical mobility    Baseline  average highest pain 5/10    Time  3    Period  Weeks    Status  Achieved        PT Long Term Goals - 08/17/17 1655      PT LONG TERM GOAL #1   Title  Pt to improve cervical flexion/ ext by >/= 15 degrees and rotaton to >/= 60 with </= 2/10 pain to promote functional mobility for safety with driving.     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     Time  4    Period  Weeks    Status  On-going    Target Date  09/14/17      PT LONG TERM GOAL #3   Title  pt to report decreased incidence of HA to </= 1-2 per week to demo improving condition    Time  4    Period  Weeks    Status  On-going    Target Date  09/14/17      PT LONG TERM GOAL #4   Title  Increase FOTO score to </= 32% limited to demo improvement in function     Time  4  Status  On-going    Target Date  09/14/17            Plan - 08/28/17 1649    Clinical Impression Statement  Temporary relief from PT. Pain averages 5/10 .  pain flare today with manual and cold pack  so stopped and stretched.    Pain 4/10 at end of session.  Patient tolerated light ressirance exercises for HEP without increase in pain (IR/ER shoulder with red band)    PT Next Visit Plan  review new HEP. Check for ADL/ posture questions.  FOTO??   Measure cervical ROM    PT Home Exercise Plan  upper trap stretching, levator scap stretch, chin tucks (in supine), upper cervical rotation. RED IR/ER shoulder.    Consulted and Agree with Plan of Care  Patient       Patient will benefit from skilled therapeutic intervention in order to improve the following deficits and impairments:     Visit Diagnosis: Cervicalgia  Other muscle spasm  Abnormal posture     Problem List Patient Active Problem List   Diagnosis Date Noted  . Menopause syndrome 06/10/2017  . Adverse reaction to vaccine, sequela 03/25/2017  . Atypical chest pain 02/27/2017  . PAC (premature atrial contraction) 02/27/2017  . PVC's (premature ventricular contractions) 02/27/2017  . PSVT (paroxysmal supraventricular tachycardia) ([place]) 11/26/2016  . History of palpitations 11/08/2016  . Nontraumatic incomplete tear of right rotator cuff 07/14/2016  . AC (acromioclavicular) arthritis 07/14/2016  . Impingement syndrome of right shoulder 07/14/2016  . Fibrocystic breast changes, right 05/21/2016  . Menopausal symptoms 10/30/2015  . Insomnia 10/16/2015  . Snoring 07/26/2015  . Migraine without aura and without status migrainosus, not intractable 02/24/2015  . Muscle spasm 02/24/2015  . Facial paresthesia 02/24/2015  . Concussion with loss of consciousness 07/21/2014  . Hyperlipidemia associated with type 2 diabetes mellitus ([place]) 06/13/2014  . Pain in joint, shoulder region 08/11/2013  . Vitamin D deficiency 05/07/2013  . Chronic migraine 11/20/2012  . S/P Total Abdominal Hysterectomy and Left Salpingo-oophorectomy 10/20/2011  . Headache disorder   . Uncontrolled type 2 diabetes mellitus ([place]) 07/20/2011  . Obesity (BMI 30-39.9) 07/20/2011  . Essential hypertension 07/20/2011    Tayron Hunnell  PTA 08/28/2017, 5:53 PM  [facility] [address] [place], [place], [address] Phone: [phone]   Fax:   [phone]  Name: ALENA BLANKENBECKLER MRN: [id number] Date of Birth: [birth year]

## 2017-08-29 DIAGNOSIS — M25511 Pain in right shoulder: Secondary | ICD-10-CM | POA: Diagnosis not present

## 2017-08-30 ENCOUNTER — Encounter: Payer: Self-pay | Admitting: Physical Therapy

## 2017-08-30 ENCOUNTER — Ambulatory Visit: Payer: 59 | Admitting: Physical Therapy

## 2017-08-30 DIAGNOSIS — M62838 Other muscle spasm: Secondary | ICD-10-CM | POA: Diagnosis not present

## 2017-08-30 DIAGNOSIS — R293 Abnormal posture: Secondary | ICD-10-CM

## 2017-08-30 DIAGNOSIS — M542 Cervicalgia: Secondary | ICD-10-CM | POA: Diagnosis not present

## 2017-08-30 NOTE — Therapy (Addendum)
[place], [place], [address] Phone: [phone]   Fax:  [phone]  Physical Therapy Treatment / Discharge Note  Patient Details  Name: [name] MRN: [id number] Date of Birth: 03-Aug-1970 Referring Provider: Collier Salina whitfield MD   Encounter Date: 08/30/2017  PT End of Session - 08/30/17 1641    Visit Number  10    Number of Visits  13    Date for PT Re-Evaluation  09/14/17    PT Start Time  5573 pt arrived 10 minutes late today    PT Stop Time  1718    PT Time Calculation (min)  37 min    Activity Tolerance  Patient tolerated treatment well    Behavior During Therapy  [facility] for tasks assessed/performed       Past Medical History:  Diagnosis Date  . Allergy   . Anemia   . Anxiety    claustrophobic  . Asthma   . Diabetes mellitus 2011   did not start metforfin, losing weight  . Headache disorder   . History of concussion   . Hypertension   . Shoulder impingement syndrome, right     Past Surgical History:  Procedure Laterality Date  . ABDOMINAL HYSTERECTOMY  2006   heavy menses, endometriosis, l oophrectomy  . BACK SURGERY    . BREAST BIOPSY Right 2018   benign  . BREAST EXCISIONAL BIOPSY Right 2003   benign  . BREAST EXCISIONAL BIOPSY Right 1999   benign  . BREAST SURGERY     right breast x 2 , benign  . HERNIA REPAIR  2003   left inguinal   . LEFT OOPHORECTOMY    . SHOULDER ARTHROSCOPY WITH SUBACROMIAL DECOMPRESSION AND OPEN ROTATOR C Right 07/14/2016   Procedure: RIGHT SHOULDER ARTHROSCOPY WITH SUBACROMIAL DECOMPRESSION, DISTAL CLAVICLE RESECTION AND MINI OPEN ROTATOR CUFF REPAIR, OPEN BICEP TENDODESIS;  Surgeon: Garald Balding, MD;  Location: [place];  Service: Orthopedics;  Laterality: Right;  . SHOULDER CLOSED REDUCTION Right 09/08/2016   Procedure: RIGHT CLOSED MANIPULATION SHOULDER;  Surgeon: Garald Balding, MD;  Location: [place];   Service: Orthopedics;  Laterality: Right;  . TUBAL LIGATION      There were no vitals filed for this visit.  Subjective Assessment - 08/30/17 1640    Subjective  "doing better today, but today was a pretty chill and I didn't have to do much"     Currently in Pain?  Yes    Pain Score  2     Pain Location  Neck    Pain Orientation  Left    Pain Descriptors / Indicators  Tightness    Pain Type  Chronic pain    Pain Onset  More than a month ago    Pain Frequency  Constant    Aggravating Factors   looking down for long periods of time,     Pain Relieving Factors  ice, massge, changing positions.          [facility] PT Assessment - 08/30/17 1642      Observation/Other Assessments   Focus on Therapeutic Outcomes (FOTO)   56% limited      ROM / Strength   AROM / PROM / Strength  Strength      AROM   Cervical Flexion  30    Cervical Extension  20    Cervical - Right Side Bend  40    Cervical - Left Side Bend  30    Cervical - Right Rotation  58    Cervical - Left Rotation  60      Strength   Strength Assessment Site  Shoulder    Right/Left Shoulder  Left    Left Shoulder Flexion  3-/5    Left Shoulder Extension  3+/5    Left Shoulder ABduction  3/5    Left Shoulder Internal Rotation  3+/5    Left Shoulder External Rotation  3+/5            No data recorded       [facility] Adult PT Treatment/Exercise - 08/30/17 0001      Moist Heat Therapy   Number Minutes Moist Heat  10 Minutes    Moist Heat Location  Cervical in supine             PT Education - 08/30/17 1709    Education provided  Yes    Education Details  reviewed previously provided HEP, discussed lack of progress in 10 visits and benefits of returning to MD for further assessment.     Person(s) Educated  Patient    Methods  Explanation;Verbal cues    Comprehension  Verbalized understanding;Verbal cues required       PT Short Term Goals - 08/30/17 1651      PT SHORT TERM GOAL #1   Title  pt to  be I with initial HEP    Time  3    Period  Weeks    Status  Achieved      PT SHORT TERM GOAL #2   Title  pt to verbalize/ demo proper posture in sitting/ standing and lifting mechanics to prevent and reduce neck pain    Time  3    Period  Weeks    Status  Achieved      PT SHORT TERM GOAL #3   Title  pt to decrease upper trap and surrounding muscle spasm to reduce pain to </= 5/10 and promote cervical mobility    Baseline  average highest pain 5/10    Time  3    Period  Weeks    Status  Achieved        PT Long Term Goals - 08/30/17 1654      PT LONG TERM GOAL #1   Title  Pt to improve cervical flexion/ ext by >/= 15 degrees and rotaton to >/= 60 with </= 2/10 pain to promote functional mobility for safety with driving.     Time  4    Period  Weeks    Status  Not Met      PT LONG TERM GOAL #2   Title  pt be able to sit and perform desk work and ADLs with </= 2/10 pain to promote lite duty work activities and promote return to activity.     Time  4    Period  Weeks    Status  Not Met      PT LONG TERM GOAL #3   Title  pt to report decreased incidence of HA to </= 1-2 per week to demo improving condition    Baseline  continuous HA    Time  4    Period  Weeks    Status  Not Met      PT LONG TERM GOAL #4   Title  Increase FOTO score to </= 32% limited to demo improvement in function     Time  4    Period  Weeks    Status  Not Met      PT LONG TERM GOAL #5   Title  pt to be I with all HEP given as of last visit to maintain and progress their current level of function     Time  6    Period  Weeks            Plan - 08/30/17 1709    Clinical Impression Statement  pt reported reduced pain the neck today at 2/10 but reports a light day. she improve rotation and sidebending but demonstrates significant limitation compared to previous  measures with flexion/ extension. she reported new symptoms of N/T into the L hand. she continues to report constant HA, and tightness  shoulder /neck. Additionally she scored lower on her FOTO compared to evaluation (51% limited) and is at 56% limited. based on limited progress and continued pain/ HA in 10 visits plan to discharge and refer back to her MD for further assessment.     PT Next Visit Plan  D/C    PT Home Exercise Plan  upper trap stretching, levator scap stretch, chin tucks (in supine), upper cervical rotation. RED IR/ER shoulder.    Consulted and Agree with Plan of Care  Patient       Patient will benefit from skilled therapeutic intervention in order to improve the following deficits and impairments:  Pain, Decreased activity tolerance, Postural dysfunction, Improper body mechanics, Hypomobility, Decreased strength, Increased fascial restricitons, Increased muscle spasms, Decreased range of motion, Decreased endurance, Abnormal gait  Visit Diagnosis: Cervicalgia  Other muscle spasm  Abnormal posture     Problem List Patient Active Problem List   Diagnosis Date Noted  . Menopause syndrome 06/10/2017  . Adverse reaction to vaccine, sequela 03/25/2017  . Atypical chest pain 02/27/2017  . PAC (premature atrial contraction) 02/27/2017  . PVC's (premature ventricular contractions) 02/27/2017  . PSVT (paroxysmal supraventricular tachycardia) ([place]) 11/26/2016  . History of palpitations 11/08/2016  . Nontraumatic incomplete tear of right rotator cuff 07/14/2016  . AC (acromioclavicular) arthritis 07/14/2016  . Impingement syndrome of right shoulder 07/14/2016  . Fibrocystic breast changes, right 05/21/2016  . Menopausal symptoms 10/30/2015  . Insomnia 10/16/2015  . Snoring 07/26/2015  . Migraine without aura and without status migrainosus, not intractable 02/24/2015  . Muscle spasm 02/24/2015  . Facial paresthesia 02/24/2015  . Concussion with loss of consciousness 07/21/2014  . Hyperlipidemia associated with type 2 diabetes mellitus ([place]) 06/13/2014  . Pain in joint, shoulder region 08/11/2013  .  Vitamin D deficiency 05/07/2013  . Chronic migraine 11/20/2012  . S/P Total Abdominal Hysterectomy and Left Salpingo-oophorectomy 10/20/2011  . Headache disorder   . Uncontrolled type 2 diabetes mellitus ([place]) 07/20/2011  . Obesity (BMI 30-39.9) 07/20/2011  . Essential hypertension 07/20/2011   Starr [place] PT, DPT, LAT, ATC  08/30/17  5:27 PM      [facility] [address] [place], [place], [address] Phone: [phone]   Fax:  [phone]  Name: MACKENZYE MACKEL MRN: [id number] Date of Birth: December 13, 1970       PHYSICAL THERAPY DISCHARGE SUMMARY  Visits from Start of Care: 10  Current functional level related to goals / functional outcomes: See goals   Remaining deficits: Continued HA and limited cervical flexion/ extension. Continued spasm in the upper trap/ levator scapulae and sub-occipitals.    Education / Equipment: HEP, theraband, posture.   Plan: Patient agrees to discharge.  Patient goals were partially met. Patient  is being discharged due to lack of progress.  ?????        Sheena Donegan PT, DPT, LAT, ATC  08/30/17  5:33 PM

## 2017-09-11 MED FILL — EMGALITY 120 MG/ML SOAJ: 120 | 30 days supply | Qty: 1 | Fill #3

## 2017-09-11 MED FILL — TRULICITY 1.5 MG/0.5 ML PEN: 1.5 | 28 days supply | Qty: 2 | Fill #5

## 2017-09-12 ENCOUNTER — Telehealth (INDEPENDENT_AMBULATORY_CARE_PROVIDER_SITE_OTHER): Payer: Self-pay | Admitting: Orthopaedic Surgery

## 2017-09-12 NOTE — Telephone Encounter (Signed)
PLEASE ADVISE.

## 2017-09-12 NOTE — Telephone Encounter (Signed)
Patient called stating that her physical therapist recommended that she see a neurologist.  Patient states that she already saw Dr. Krista Blue and is requesting a referral to a different neurologist.

## 2017-09-12 NOTE — Telephone Encounter (Signed)
Need reason to make a referral-? Could be made from primary care

## 2017-09-12 NOTE — Telephone Encounter (Signed)
NOTIFIED PT TO GET REFERRAL TO FROM PCP

## 2017-09-13 ENCOUNTER — Other Ambulatory Visit: Payer: Self-pay | Admitting: Internal Medicine

## 2017-09-13 MED FILL — DILTIAZEM ER 240 MG TABLET: 240 | 30 days supply | Qty: 30 | Fill #1

## 2017-09-14 MED FILL — LOSARTAN-HCTZ 50-12.5 MG TA: 50-12.5 | 90 days supply | Qty: 90 | Fill #0

## 2017-09-26 ENCOUNTER — Ambulatory Visit: Payer: 59 | Admitting: Internal Medicine

## 2017-09-27 ENCOUNTER — Other Ambulatory Visit: Payer: Self-pay | Admitting: Specialist

## 2017-09-27 DIAGNOSIS — S46911A Strain of unspecified muscle, fascia and tendon at shoulder and upper arm level, right arm, initial encounter: Secondary | ICD-10-CM

## 2017-10-04 MED FILL — ATORVASTATIN 20 MG TABLET: 20 | 90 days supply | Qty: 90 | Fill #1

## 2017-10-04 MED FILL — SPIRONOLACTONE 25 MG TABLET: 25 | 30 days supply | Qty: 30 | Fill #2

## 2017-10-11 MED FILL — EMGALITY 120 MG/ML SOAJ: 120 | 30 days supply | Qty: 1 | Fill #4

## 2017-10-11 MED FILL — TRULICITY 1.5 MG/0.5 ML PEN: 1.5 | 28 days supply | Qty: 2 | Fill #6

## 2017-10-13 ENCOUNTER — Ambulatory Visit: Payer: 59

## 2017-10-13 ENCOUNTER — Other Ambulatory Visit: Payer: Self-pay

## 2017-10-13 ENCOUNTER — Ambulatory Visit (HOSPITAL_COMMUNITY)
Admission: RE | Admit: 2017-10-13 | Discharge: 2017-10-13 | Disposition: A | Discharge status: Home / Self Care | Payer: PRIVATE HEALTH INSURANCE | Source: Ambulatory Visit | Attending: Specialist | Admitting: Specialist

## 2017-10-13 DIAGNOSIS — M7521 Bicipital tendinitis, right shoulder: Secondary | ICD-10-CM | POA: Diagnosis not present

## 2017-10-13 DIAGNOSIS — S46911A Strain of unspecified muscle, fascia and tendon at shoulder and upper arm level, right arm, initial encounter: Secondary | ICD-10-CM | POA: Diagnosis present

## 2017-10-13 DIAGNOSIS — X58XXXA Exposure to other specified factors, initial encounter: Secondary | ICD-10-CM | POA: Insufficient documentation

## 2017-10-13 MED ORDER — DIPHENHYDRAMINE HCL 50 MG/ML IJ SOLN
INTRAMUSCULAR | Status: AC
  Filled 2017-10-13: qty 1

## 2017-10-13 MED ORDER — METHYLPREDNISOLONE SODIUM SUCC 125 MG IJ SOLR
INTRAMUSCULAR | Status: AC
  Filled 2017-10-13: qty 2

## 2017-10-13 MED ORDER — LIDOCAINE HCL (PF) 1 % IJ SOLN
5.0000 mL | Freq: Once | INTRAMUSCULAR | Status: AC
  Administered 2017-10-13: 5 mL via INTRADERMAL

## 2017-10-13 MED ORDER — IOPAMIDOL (ISOVUE-M 200) INJECTION 41%
5.0000 mL | Freq: Once | INTRAMUSCULAR | Status: AC
  Administered 2017-10-13: 5 mL via INTRA_ARTICULAR

## 2017-10-13 MED ORDER — GADOBENATE DIMEGLUMINE 529 MG/ML IV SOLN
0.1000 mL | Freq: Once | INTRAVENOUS | Status: AC | PRN
  Administered 2017-10-13: 0.1 mL via INTRA_ARTICULAR

## 2017-10-13 MED ORDER — DIPHENHYDRAMINE HCL 50 MG/ML IJ SOLN
25.0000 mg | Freq: Once | INTRAMUSCULAR | Status: AC
  Administered 2017-10-13: 25 mg via INTRAVENOUS

## 2017-10-13 NOTE — Procedures (Signed)
Successful right shoulder arthrogram using fluoro for MRI.  Please see imaging report for details related to itching after injection.    JWatts MD

## 2017-10-13 NOTE — Progress Notes (Signed)
RR page for itching and warmth to Right arm after after receiving fluoro injection for MRI of Right shoulder. Pt alert and oriented, speaking in full sentences, voice clear, pt denies swelling or itching to throat, tongue or mouth.  22 G placed in [place] for 25 mg benadryl IVP

## 2017-10-15 ENCOUNTER — Encounter: Payer: Self-pay | Admitting: Nurse Practitioner

## 2017-10-15 ENCOUNTER — Ambulatory Visit (INDEPENDENT_AMBULATORY_CARE_PROVIDER_SITE_OTHER): Payer: Self-pay | Admitting: Nurse Practitioner

## 2017-10-15 VITALS — BP 118/84 | HR 83 | Temp 98.4°F | Resp 18 | Wt 218.4 lb

## 2017-10-15 DIAGNOSIS — T7840XA Allergy, unspecified, initial encounter: Secondary | ICD-10-CM

## 2017-10-15 MED ORDER — PREDNISONE 20 MG PO TABS: 20.0000 mg | ORAL_TABLET | Freq: Every day | ORAL | 0 refills | Status: DC

## 2017-10-15 NOTE — Patient Instructions (Signed)
Anaphylactic Reaction, Adult An anaphylactic reaction (anaphylaxis) is a sudden, severe allergic reaction that affects multiple areas of the body. Affected areas of the body may include the skin, mouth, lungs, heart, or gut (digestive system). Anaphylaxis can be life-threatening. This condition requires immediate medical attention, and sometimes hospitalization. What are the causes? This condition is caused by exposure to a substance that you are allergic to (allergen). In response to this exposure, the body releases proteins (antibodies) and other compounds, such as histamine, into the bloodstream. This causes swelling in certain tissues and loss of blood pressure to important areas, such as the heart and lungs. Common allergens that can cause anaphylaxis include:  Medicines.  Foods, especially peanuts, wheat, shellfish, milk, and eggs.  Insect bites or stings.  Blood or parts of blood, including plasma, immunoglobulins, or serum.  Chemicals, such as dyes, latex, and contrast material that is used for medical tests.  What increases the risk? This condition is more likely to occur in people who:  Have allergies.  Have had anaphylaxis before.  Have a family history of anaphylaxis.  Have certain medical conditions, including asthma and eczema.  What are the signs or symptoms? Symptoms of anaphylaxis include:  Nasal congestion.  Headache.  Flushed face.  Tingling in the mouth.  An itchy, red rash.  Swelling of the eyes, lips, face, or tongue.  Swelling of the back of the mouth and the throat.  Wheezing.  A hoarse voice.  Itchy, red, swollen areas of skin (hives).  Dizziness or light-headedness.  Fainting.  Anxiety or confusion.  Abdominal or chest pain.  Difficulty breathing, speaking, or swallowing.  Chest or throat tightness.  Fast or irregular heartbeats (palpitations).  Vomiting.  Diarrhea.  How is this diagnosed? This condition is diagnosed based  on a physical exam and your history of recent exposure to allergens. You may be referred for follow-up testing by a health care provider who specializes in allergies. This testing can confirm the diagnosis and determine which substances you are allergic to. Testing may include:  Skin tests. These may involve: ? Injecting a small amount of the possible allergen between layers of your skin (intradermal injection). ? Applying patches to your skin.  Blood tests.  How is this treated? Emergency treatment may include:  Medicines that help: ? To relieve itching and hives (antihistamines). ? To reduce swelling (corticosteroids). ? To tighten your blood vessels and increase your heart rate (epinephrine).  Oxygen therapy to help you breathe.  Giving fluids through an IV tube.  Your health care provider may teach you how to use an anaphylaxis kit and how to give yourself an epinephrine injection with what is commonly called an auto-injector "pen" (pre-filled automatic epinephrine injection device). If you think that you are having an anaphylactic reaction, you should use an auto-injector pen or an anaphylaxis kit. If you use epinephrine, you must still seek emergency medical treatment. Follow these instructions at home: Safety  Always keep an auto-injector pen or an anaphylaxis kit near you. These can be lifesaving if you have a severe anaphylactic reaction. Use your auto-injector pen or anaphylaxis kit as told by your health care provider.  Do not drive until your health care provider approves.  Make sure that you, the members of your household, and your employer know: ? How to use an anaphylaxis kit. ? How to use an auto-injector pen to give you an epinephrine injection.  Replace your epinephrine immediately after you use your auto-injector pen, in case you have  another reaction.  Wear a medical alert bracelet or necklace that states your allergy, if told by your health care  provider.  Learn the signs of anaphylaxis.  Work with your health care providers to come up with an anaphylaxis plan. Preparation is important. General instructions  Take over-the-counter and prescription medicines only as told by your health care provider.  If you have hives or a rash: ? Use an over-the-counter antihistamine as told by your health care provider. ? Apply cold, wet cloths (cold compresses) to your skin or take baths or showers in cool water. Avoid hot water.  If you had tests done, it is your responsibility to get your test results. Ask your health care provider or the department performing the tests when your results will be ready.  Tell any health care providers who care for you that you have an allergy.  Keep all follow-up visits as told by your health care provider. This is important.  Prednisone '20mg'$  daily for three days.  Take Zyrtec 1 tablet daily for three days.  Pepcid AC '20mg'$  daily for three days.  How is this prevented?  Avoid allergens that have caused an anaphylactic reaction for you before.  When you are at a restaurant, tell your server that you have an allergy. If you are unsure whether a meal has an ingredient that you are allergic to, ask your server. Contact a health care provider if:  You develop symptoms of an allergic reaction. You may notice them soon after you are exposed to a substance. The symptoms may include: ? Rash. ? Headache. ? Sneezing or a runny nose. ? Swelling. ? Nausea. ? Diarrhea. Get help right away if:  You needed to use epinephrine. ? An epinephrine injection helps to manage life-threatening allergic reactions, but you still need to go to the emergency room even if epinephrine seems to work. This is important because anaphylaxis may happen again within 72 hours (rebound anaphylaxis). ? If you used epinephrine to treat anaphylaxis outside of the hospital, you need additional medical care. This may include more doses of  epinephrine.  You develop: ? A tight feeling in your chest or throat. ? Wheezing or difficulty breathing. ? Hives. ? Red skin or itching all over your body. ? Swelling in your lips, tongue, or the back of your throat.  You have severe vomiting or diarrhea.  You faint or you feel like you are going to faint. These symptoms may represent a serious problem that is an emergency. Do not wait to see if the symptoms will go away. Use your auto-injector pen or anaphylaxis kit as you have been instructed, and get medical help right away. Call your local emergency services (911 in the [place] Do not drive yourself to the hospital. This information is not intended to replace advice given to you by your health care provider. Make sure you discuss any questions you have with your health care provider. Document Released: 05/23/2005 Document Revised: 01/19/2016 Document Reviewed: 12/07/2015 Elsevier Interactive Patient Education  [employer].

## 2017-10-15 NOTE — Progress Notes (Addendum)
Subjective:    Patient ID: [name], female    DOB: May 16, 1971, 47 y.o.   MRN: [id number]  Patient is a 47 year old female who presents today for complaints of sore throat.  The patient states 2 days ago she had an MRI with IV contrast.  During the MRI, the patient experienced an allergic reaction.  Patient is unsure what happened or what medications were administered, as she had taken Valium prior to the imaging.  Patient states since that time she has had sore throat, and mild chest tightness.  Patient denies shortness of breath, difficulty breathing, or feeling like her throat is closing.  Patient further denies any upper respiratory symptoms, such as cough, congestion, headache, rhinorrhea, or coryza.  The patient has been taking Benadryl which she states has helped relieve some of her symptoms.  Patient states she was just like to get checked out to make sure this is not a result of the allergic reaction.  Patient does have a history of type 2 diabetes, with her last A1c 8.0.  Patient has a very long list of allergies both medication and food, and also allergic to both MR contrast and CT contrast.  Reviewed patient's current medications, allergies, and past medical history.   Review of Systems  Constitutional: Negative.   HENT: Positive for sore throat.   Eyes: Negative.   Respiratory: Positive for chest tightness.   Cardiovascular: Negative.   Allergic/Immunologic: Positive for food allergies.  Neurological: Negative.   Psychiatric/Behavioral: Negative.       Objective:   Physical Exam  Constitutional: She is oriented to person, place, and time. She appears well-developed and well-nourished. No distress.  HENT:  Head: Normocephalic and atraumatic.  Right Ear: External ear normal.  Left Ear: External ear normal.  Mouth/Throat: Oropharynx is clear and moist. No oropharyngeal exudate.  Mild oropharyngeal erythema, no tonsillar swelling  Eyes: Pupils are equal, round, and  reactive to light. Conjunctivae and EOM are normal.  Neck: Normal range of motion. Neck supple. No thyromegaly present.  Cardiovascular: Normal rate, regular rhythm and normal heart sounds.  Pulmonary/Chest: Effort normal and breath sounds normal. No stridor. No respiratory distress. She has no wheezes.  Abdominal: Soft. Bowel sounds are normal.  Neurological: She is alert and oriented to person, place, and time.  Skin: Skin is warm and dry. Capillary refill takes less than 2 seconds.  Psychiatric: She has a normal mood and affect.      Assessment & Plan:  Allergic reaction 1.  Discussed with patient at length the use of prednisone due to her A1c of 8.0.  Provider and patient both decided it would be beneficial to do a short course of steroids, prednisone 20 mg for 3 days. 2.  Zyrtec 1 tablet daily for 3 days. 3.  Pepcid AC 20 mg daily for 3 days. 4.  Discussed with patient at length the benefit of making sure that she consider having a steroid prep before any kind of imaging procedure. 5.  Also discussed with patient the need to get diabetes under better control. 6.  Patient education provided. 7.  Patient verbalized understanding and has no questions at time of discharge. Meds ordered this encounter  Medications  . predniSONE (DELTASONE) 20 MG tablet    Sig: Take 1 tablet (20 mg total) by mouth daily with breakfast.    Dispense:  3 tablet    Refill:  0    Order Specific Question:   Supervising Provider    Answer:  Benay Pillow E [[id number]]

## 2017-10-18 MED FILL — DILTIAZEM ER 240 MG TABLET: 240 | 30 days supply | Qty: 30 | Fill #2

## 2017-11-03 ENCOUNTER — Encounter: Payer: Self-pay | Admitting: Internal Medicine

## 2017-11-09 ENCOUNTER — Ambulatory Visit: Payer: Self-pay | Admitting: Neurology

## 2017-11-13 MED FILL — TRULICITY 1.5 MG/0.5 ML PEN: 1.5 | 28 days supply | Qty: 2 | Fill #7
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
## 2017-11-14 ENCOUNTER — Ambulatory Visit: Payer: 59 | Admitting: Nurse Practitioner

## 2017-11-14 ENCOUNTER — Encounter: Payer: Self-pay | Admitting: Nurse Practitioner

## 2017-11-14 VITALS — BP 122/86 | HR 67 | Ht 66.0 in | Wt 224.8 lb

## 2017-11-14 DIAGNOSIS — G43709 Chronic migraine without aura, not intractable, without status migrainosus: Secondary | ICD-10-CM | POA: Diagnosis not present

## 2017-11-14 DIAGNOSIS — IMO0002 Reserved for concepts with insufficient information to code with codable children: Secondary | ICD-10-CM

## 2017-11-14 DIAGNOSIS — R400 Somnolence: Secondary | ICD-10-CM | POA: Diagnosis not present

## 2017-11-14 DIAGNOSIS — I10 Essential (primary) hypertension: Secondary | ICD-10-CM

## 2017-11-14 DIAGNOSIS — G43009 Migraine without aura, not intractable, without status migrainosus: Secondary | ICD-10-CM

## 2017-11-14 NOTE — Patient Instructions (Signed)
Continue current meds for now Set up for sleep study Follow up after this

## 2017-11-15 ENCOUNTER — Telehealth: Payer: Self-pay | Admitting: Adult Health

## 2017-11-15 NOTE — Telephone Encounter (Signed)
Late Entry 11/14/17 4:45 PM:  Patient called to voice some concerns about her office visit today.  I assured the patient that all of her concerns would be taken seriously, the management team would be made aware and we would investigate her concerns.  Patient voiced understanding.  She stated that she no longer wanted to see Dr. Krista Blue or Cecille Rubin.  I advised that per our policy Dr. Krista Blue will need to consult with 1 of her colleagues to see if another provider wanted to assume care.  Patient voiced understanding.

## 2017-11-16 NOTE — Telephone Encounter (Addendum)
It is Ok to switch provider. Her main concern is migraine headaches, I will forward to Dr. Jaynee Eagles, the other choices are refer to academic center for 2nd opinion.

## 2017-11-16 NOTE — Telephone Encounter (Signed)
I reviewed her record, there is really nothing I can add to her treatment. I think she needs to be referred to an academic headache center. We can discuss

## 2017-11-20 ENCOUNTER — Other Ambulatory Visit: Payer: Self-pay

## 2017-11-20 ENCOUNTER — Other Ambulatory Visit (INDEPENDENT_AMBULATORY_CARE_PROVIDER_SITE_OTHER): Payer: 59

## 2017-11-20 DIAGNOSIS — R748 Abnormal levels of other serum enzymes: Secondary | ICD-10-CM

## 2017-11-20 DIAGNOSIS — E1165 Type 2 diabetes mellitus with hyperglycemia: Secondary | ICD-10-CM | POA: Diagnosis not present

## 2017-11-20 LAB — MICROALBUMIN / CREATININE URINE RATIO
Creatinine,U: 358 mg/dL
Microalb Creat Ratio: 3.2 mg/g (ref 0.0–30.0)
Microalb, Ur: 11.5 mg/dL — ABNORMAL HIGH (ref 0.0–1.9)

## 2017-11-20 LAB — HEPATIC FUNCTION PANEL
ALT: 19 U/L (ref 0–35)
AST: 15 U/L (ref 0–37)
Albumin: 4.3 g/dL (ref 3.5–5.2)
Alkaline Phosphatase: 122 U/L — ABNORMAL HIGH (ref 39–117)
Bilirubin, Direct: 0.1 mg/dL (ref 0.0–0.3)
Total Bilirubin: 0.5 mg/dL (ref 0.2–1.2)
Total Protein: 7.3 g/dL (ref 6.0–8.3)

## 2017-11-20 LAB — HEMOGLOBIN A1C: Hgb A1c MFr Bld: 9.2 % — ABNORMAL HIGH (ref 4.6–6.5)

## 2017-11-21 MED FILL — DILTIAZEM ER 240 MG TABLET: 240 | 30 days supply | Qty: 30 | Fill #3

## 2017-11-23 MED FILL — EMGALITY 120 MG/ML SOAJ: 120 | 30 days supply | Qty: 1 | Fill #5

## 2017-11-23 NOTE — Telephone Encounter (Signed)
I called and followed up with the patient.  Advised that Dr. Krista Blue has reached out to her colleagues but they did not feel they had anything additional to add to her treatment plan.  I did advise that Dr. Krista Blue would refer her to an academic center if she desires.  She would like a referral.  She does state though that if the academic center does not have anything additional to offer.  She would be amenable to continue to see Dr. Krista Blue.  She would also like to know if Dr. Krista Blue would complete a prior authorization on Emgality.  I advised that I would forward a message to Dr. Krista Blue

## 2017-11-23 NOTE — Telephone Encounter (Signed)
I called [facility] and they were able to fill her Emgality at [facility]. No PA needed.

## 2017-11-24 ENCOUNTER — Ambulatory Visit (INDEPENDENT_AMBULATORY_CARE_PROVIDER_SITE_OTHER): Payer: 59 | Admitting: Internal Medicine

## 2017-11-24 ENCOUNTER — Encounter: Payer: Self-pay | Admitting: Internal Medicine

## 2017-11-24 VITALS — BP 102/78 | HR 75 | Temp 98.3°F | Resp 15 | Ht 66.0 in | Wt 225.0 lb

## 2017-11-24 DIAGNOSIS — R5383 Other fatigue: Secondary | ICD-10-CM

## 2017-11-24 DIAGNOSIS — R809 Proteinuria, unspecified: Secondary | ICD-10-CM | POA: Diagnosis not present

## 2017-11-24 DIAGNOSIS — R7989 Other specified abnormal findings of blood chemistry: Secondary | ICD-10-CM | POA: Diagnosis not present

## 2017-11-24 DIAGNOSIS — E1165 Type 2 diabetes mellitus with hyperglycemia: Secondary | ICD-10-CM

## 2017-11-24 DIAGNOSIS — E1129 Type 2 diabetes mellitus with other diabetic kidney complication: Secondary | ICD-10-CM

## 2017-11-24 DIAGNOSIS — Z0000 Encounter for general adult medical examination without abnormal findings: Secondary | ICD-10-CM

## 2017-11-24 DIAGNOSIS — J029 Acute pharyngitis, unspecified: Secondary | ICD-10-CM

## 2017-11-24 MED ORDER — INSULIN NPH (HUMAN) (ISOPHANE) 100 UNIT/ML ~~LOC~~ SUSP: 5.0000 [IU] | Freq: Two times a day (BID) | SUBCUTANEOUS | 11 refills | Status: DC

## 2017-11-24 MED ORDER — INSULIN SYRINGES (DISPOSABLE) U-100 1 ML MISC: 11 refills | Status: DC

## 2017-11-24 MED FILL — HumuLIN N 100 UNIT/ML SUSP: 100 | 28 days supply | Qty: 10 | Fill #0

## 2017-11-24 MED FILL — ULTICARE SYR 0.3 ML 30GX5/1: 30G X 5/16" | 30 days supply | Qty: 60 | Fill #0

## 2017-11-24 NOTE — Progress Notes (Signed)
Patient ID: [name], female    DOB: 22-Oct-1970  Age: 47 y.o. MRN: [id number]  The patient is here for annual preventive  examination and management of other chronic and acute problems.  breast exams done by [facility] Dr. Gordy Levan  S/p  total hysterectomy    The risk factors are reflected in the social history.  The roster of all physicians providing medical care to patient - is listed in the Snapshot section of the chart.  Activities of daily living:  The patient is 100% independent in all ADLs: dressing, toileting, feeding as well as independent mobility  Home safety : The patient has smoke detectors in the home. They wear seatbelts.  There are no firearms at home. There is no violence in the home.   There is no risks for hepatitis, STDs or HIV. There is no   history of blood transfusion. They have no travel history to infectious disease endemic areas of the world.  The patient has seen their dentist in the last six month. They have NOT seen their eye doctor in the last year by [facility] .      They do not  have excessive sun exposure. Discussed the need for sun protection: hats, long sleeves and use of sunscreen if there is significant sun exposure.   Diet: the importance of a healthy diet is discussed. They do have a healthy diet.  The benefits of regular aerobic exercise were discussed. She walks 4 times per week ,  20 minutes.   Depression screen: there are no signs or vegative symptoms of depression- irritability, change in appetite, anhedonia, sadness/tearfullness.  The following portions of the patient's history were reviewed and updated as appropriate: allergies, current medications, past family history, past medical history,  past surgical history, past social history  and problem list.  Visual acuity was not assessed per patient preference since she has regular follow up with her ophthalmologist. Hearing and body mass index were assessed and reviewed.   During the  course of the visit the patient was educated and counseled about appropriate screening and preventive services including : fall prevention , diabetes screening, nutrition counseling, colorectal cancer screening, and recommended immunizations.    CC: The primary encounter diagnosis was Encounter for preventive health examination. Diagnoses of Fatigue, unspecified type, Uncontrolled type 2 diabetes mellitus with hyperglycemia ([place]), Microalbuminuria due to type 2 diabetes mellitus ([place]), Pharyngitis, unspecified etiology, and Abnormal TSH were also pertinent to this visit.  1) 3 month follow up on uncontrolled diabetes. [facility] .   Patient has no complaints today.  Patient is trying to follow a low glycemic index diet and taking all prescribed medications regularly without side effects.  Fasting sugars have been averaging 180 most of the time and post prandials have been under 160 except on rare occasions. Patient is not exercising regularly and intentionally trying to lose weight .  Patient has had an eye exam in the last 12 months and checks feet regularly for signs of infection.  Patient does not walk barefoot outside,  And denies an numbness tingling or burning in feet. Patient is up to date on all recommended vaccinations  Had a prlolonged viral episode that lasted several weeks still has had sore throat for several  Weeks worried that her sore throat is thyroid cancer  because of the black box wrarnign on Trulicity   Lab Results  Component Value Date   HGBA1C 9.2 (H) 11/20/2017  History Chelsia has a past medical history of Allergy, Anemia, Anxiety, Asthma, Diabetes mellitus (2011), Headache disorder, History of concussion, Hypertension, and Shoulder impingement syndrome, right.   She has a past surgical history that includes Hernia repair (2003); Breast surgery; Tubal ligation; Abdominal hysterectomy (2006); Left oophorectomy; Back surgery; Shoulder arthroscopy  with subacromial decompression and open rotator cuff repair, open bicep tendon repair (Right, 07/14/2016); Shoulder Closed Reduction (Right, 09/08/2016); Breast excisional biopsy (Right, 2003); Breast excisional biopsy (Right, 1999); and Breast biopsy (Right, 2018).   Her family history includes Breast cancer in her paternal grandmother; Cancer (age of onset: 71) in her father; Cancer (age of onset: 38) in her paternal grandmother; Diabetes in her maternal grandmother, mother, and son; Heart disease in her father; Heart disease (age of onset: 55) in her mother; Heart failure in her mother; Hyperlipidemia in her mother; Hypertension in her maternal grandmother, mother, and sister; Mental illness in her sister; Stroke in her mother.She reports that she has never smoked. She has never used smokeless tobacco. She reports that she does not drink alcohol or use drugs.  Outpatient Medications Prior to Visit  Medication Sig Dispense Refill  . albuterol (PROVENTIL HFA;VENTOLIN HFA) 108 (90 BASE) MCG/ACT inhaler Inhale 2 puffs into the lungs every 6 (six) hours as needed for wheezing or shortness of breath. One puff as needed 3.7 g 1  . aspirin EC 81 MG tablet Take 81 mg by mouth daily.    [name] atorvastatin (LIPITOR) 20 MG tablet TAKE 1 TABLET (20 MG TOTAL) BY MOUTH DAILY. 90 tablet 1  . butalbital-acetaminophen-caffeine (FIORICET, ESGIC) 50-325-40 MG tablet Take 1 tablet by mouth every 6 (six) hours as needed for headache. Do not refill in less than 30 day 12 tablet 5  . cyclobenzaprine (FLEXERIL) 10 MG tablet Take 1 tablet (10 mg total) by mouth 2 (two) times daily as needed for muscle spasms. 20 tablet 0  . diazepam (VALIUM) 10 MG tablet Take 1 tablet (10 mg total) by mouth every 12 (twelve) hours as needed (muscle spasm). 30 tablet 3  . diltiazem (CARDIZEM LA) 240 MG 24 hr tablet TAKE 1 TABLET BY MOUTH DAILY 30 tablet 3  . Dulaglutide (TRULICITY) 1.5 UV/2.5DG SOPN Inject 1.5 mg into the skin once a week. 4 pen 11   . EPINEPHrine 0.3 mg/0.3 mL IJ SOAJ injection Inject 0.3 mg into the muscle as needed (for allergic reaction).   3  . Galcanezumab-gnlm (EMGALITY) 120 MG/ML SOAJ Inject 240 mg into the skin as directed AND 120 mg every 30 (thirty) days. Inj 240mg  once then 120mg  monthly. 1 pen 11  . ketorolac (TORADOL) 10 MG tablet Take 1 tablet (10 mg total) by mouth every 6 (six) hours as needed. 30 tablet 1  . losartan-hydrochlorothiazide (HYZAAR) 50-12.5 MG tablet TAKE 1 TABLET BY MOUTH DAILY 90 tablet 2  . meclizine (ANTIVERT) 25 MG tablet Take 25 mg by mouth 3 (three) times daily as needed for dizziness.    . metoprolol succinate (TOPROL-XL) 25 MG 24 hr tablet Take 25 mg by mouth daily.    . ondansetron (ZOFRAN ODT) 4 MG disintegrating tablet Take 1 tablet (4 mg total) by mouth every 8 (eight) hours as needed for nausea or vomiting. 20 tablet 0  . oxyCODONE-acetaminophen (ROXICET) 5-325 MG tablet Take 1-2 tablets by mouth every 4 (four) hours as needed for severe pain. 40 tablet 0  . spironolactone (ALDACTONE) 25 MG tablet Take 1 tablet (25 mg total) by mouth daily. As needed for fluid  retention 30 tablet 3  . traMADol (ULTRAM) 50 MG tablet Take 1 tablet (50 mg total) by mouth every 6 (six) hours as needed. 15 tablet 0  . nortriptyline (PAMELOR) 10 MG capsule Take 1 capsule (10 mg total) by mouth at bedtime. (Patient not taking: Reported on 11/24/2017) 30 capsule 11  . ondansetron (ZOFRAN) 4 MG tablet Take 1 tablet (4 mg total) by mouth every 8 (eight) hours as needed for nausea or vomiting. (Patient not taking: Reported on 11/24/2017) 10 tablet 0   No facility-administered medications prior to visit.     Review of Systems  Objective:  BP 102/78 (BP Location: Left Arm, Patient Position: Sitting, Cuff Size: Large)   Pulse 75   Temp 98.3 F (36.8 C) (Oral)   Resp 15   Ht 5\' 6"  (1.676 m)   Wt 225 lb (102.1 kg)   SpO2 95%   BMI 36.32 kg/m   Physical Exam    Assessment & Plan:   Problem List  Items Addressed This Visit    Uncontrolled type 2 diabetes mellitus ([place])    Will address elevated fasting sugars  With initiation of NPH in the evening starting dose 5 units ,  Will increase her dose of insulin  weekly by 5 units until fasting sugars are < 140.  Once fastings are under control will turn attention to post prandial sugars.  Foot exam is normal.  Reminder for annual diabetic eye exam given,.  Lab Results  Component Value Date   HGBA1C 9.2 (H) 11/20/2017   Lab Results  Component Value Date   MICROALBUR 11.5 (H) 11/20/2017          Relevant Medications   insulin NPH Human (HUMULIN N) 100 UNIT/ML injection   Pharyngitis    .  HEENT exam normal,  May be having snoring, , reflux,  Or PND .  Given suppressed Tsh will order neck US      Relevant Orders   US Soft Tissue Head/Neck   Microalbuminuria due to type 2 diabetes mellitus ([facility])    Continue losartan.    Lab Results  Component Value Date   CREATININE 0.87 07/27/2017   Lab Results  Component Value Date   NA 141 07/27/2017   K 3.9 07/27/2017   CL 102 07/27/2017   CO2 23 07/27/2017   Lab Results  Component Value Date   MICROALBUR 11.5 (H) 11/20/2017         Relevant Medications   insulin NPH Human (HUMULIN N) 100 UNIT/ML injection   Encounter for preventive health examination - Primary    Annual comprehensive preventive exam was done as well as an evaluation and management of chronic conditions . During the course of the visit the patient was educated and counseled about appropriate screening and preventive services including :  diabetes screening, lipid analysis with projected  10 year  risk for CAD , nutrition counseling, breast, cervical and colorectal cancer screening, and recommended immunizations.  She is s/p TAH for noncancerous diagnosis, Her mammogram and annual breast exam is done at [facility]  Dr Roger Kill    Printed recommendations for health maintenance screenings was given      Abnormal TSH     TSH is suppressed.  Given pharyngitis  Will order [place] and thyroid antibodies.       Relevant Orders   T4, free   US Soft Tissue Head/Neck   TSH   T3, free    Other Visit Diagnoses    Fatigue, unspecified type  Relevant Orders   TSH (Completed)   CBC with Differential/Platelet (Completed)      I am having Shawnie N. Hairfield start on insulin NPH Human and Insulin Syringes (Disposable). I am also having her maintain her meclizine, albuterol, ketorolac, EPINEPHrine, aspirin EC, oxyCODONE-acetaminophen, diazepam, Dulaglutide, Galcanezumab-gnlm, spironolactone, traMADol, cyclobenzaprine, ondansetron, atorvastatin, butalbital-acetaminophen-caffeine, nortriptyline, diltiazem, losartan-hydrochlorothiazide, and metoprolol succinate.  Meds ordered this encounter  Medications  . insulin NPH Human (HUMULIN N) 100 UNIT/ML injection    Sig: Inject 0.05 mLs (5 Units total) into the skin 2 (two) times daily before a meal.    Dispense:  10 mL    Refill:  11  . Insulin Syringes, Disposable, U-100 1 ML MISC    Sig: Use with NPH insulin    Dispense:  60 each    Refill:  11    Medications Discontinued During This Encounter  Medication Reason  . ondansetron (ZOFRAN) 4 MG tablet Duplicate    Follow-up: Return in about 3 months (around 02/24/2018) for follow up diabetes.   Crecencio Mc, MD

## 2017-11-24 NOTE — Patient Instructions (Addendum)
We are starting 5 units of NPH in the evening after dinner  continue 5 units for one week    Increase  By 3 units  every week  Until  Your  fasting sugars are  below 150 consistently   Your  sore throat may be from snoring .  If wearing a chin strap doesn't resolve your sore throat in a week or 2.  I will arrange thyroid ultrasound  Health Maintenance, Female Adopting a healthy lifestyle and getting preventive care can go a long way to promote health and wellness. Talk with your health care provider about what schedule of regular examinations is right for you. This is a good chance for you to check in with your provider about disease prevention and staying healthy. In between checkups, there are plenty of things you can do on your own. Experts have done a lot of research about which lifestyle changes and preventive measures are most likely to keep you healthy. Ask your health care provider for more information. Weight and diet Eat a healthy diet  Be sure to include plenty of vegetables, fruits, low-fat dairy products, and lean protein.  Do not eat a lot of foods high in solid fats, added sugars, or salt.  Get regular exercise. This is one of the most important things you can do for your health. ? Most adults should exercise for at least 150 minutes each week. The exercise should increase your heart rate and make you sweat (moderate-intensity exercise). ? Most adults should also do strengthening exercises at least twice a week. This is in addition to the moderate-intensity exercise.  Maintain a healthy weight  Body mass index (BMI) is a measurement that can be used to identify possible weight problems. It estimates body fat based on height and weight. Your health care provider can help determine your BMI and help you achieve or maintain a healthy weight.  For females 51 years of age and older: ? A BMI below 18.5 is considered underweight. ? A BMI of 18.5 to 24.9 is normal. ? A BMI of 25  to 29.9 is considered overweight. ? A BMI of 30 and above is considered obese.  Watch levels of cholesterol and blood lipids  You should start having your blood tested for lipids and cholesterol at 48 years of age, then have this test every 5 years.  You may need to have your cholesterol levels checked more often if: ? Your lipid or cholesterol levels are high. ? You are older than 48 years of age. ? You are at high risk for heart disease.  Cancer screening Lung Cancer  Lung cancer screening is recommended for adults 37-44 years old who are at high risk for lung cancer because of a history of smoking.  A yearly low-dose CT scan of the lungs is recommended for people who: ? Currently smoke. ? Have quit within the past 15 years. ? Have at least a 30-pack-year history of smoking. A pack year is smoking an average of one pack of cigarettes a day for 1 year.  Yearly screening should continue until it has been 15 years since you quit.  Yearly screening should stop if you develop a health problem that would prevent you from having lung cancer treatment.  Breast Cancer  Practice breast self-awareness. This means understanding how your breasts normally appear and feel.  It also means doing regular breast self-exams. Let your health care provider know about any changes, no matter how small.  If you  are in your 20s or 30s, you should have a clinical breast exam (CBE) by a health care provider every 1-3 years as part of a regular health exam.  If you are 27 or older, have a CBE every year. Also consider having a breast X-ray (mammogram) every year.  If you have a family history of breast cancer, talk to your health care provider about genetic screening.  If you are at high risk for breast cancer, talk to your health care provider about having an MRI and a mammogram every year.  Breast cancer gene (BRCA) assessment is recommended for women who have family members with BRCA-related cancers.  BRCA-related cancers include: ? Breast. ? Ovarian. ? Tubal. ? Peritoneal cancers.  Results of the assessment will determine the need for genetic counseling and BRCA1 and BRCA2 testing.  Cervical Cancer Your health care provider may recommend that you be screened regularly for cancer of the pelvic organs (ovaries, uterus, and vagina). This screening involves a pelvic examination, including checking for microscopic changes to the surface of your cervix (Pap test). You may be encouraged to have this screening done every 3 years, beginning at age 79.  For women ages 108-65, health care providers may recommend pelvic exams and Pap testing every 3 years, or they may recommend the Pap and pelvic exam, combined with testing for human papilloma virus (HPV), every 5 years. Some types of HPV increase your risk of cervical cancer. Testing for HPV may also be done on women of any age with unclear Pap test results.  Other health care providers may not recommend any screening for nonpregnant women who are considered low risk for pelvic cancer and who do not have symptoms. Ask your health care provider if a screening pelvic exam is right for you.  If you have had past treatment for cervical cancer or a condition that could lead to cancer, you need Pap tests and screening for cancer for at least 20 years after your treatment. If Pap tests have been discontinued, your risk factors (such as having a new sexual partner) need to be reassessed to determine if screening should resume. Some women have medical problems that increase the chance of getting cervical cancer. In these cases, your health care provider may recommend more frequent screening and Pap tests.  Colorectal Cancer  This type of cancer can be detected and often prevented.  Routine colorectal cancer screening usually begins at 48 years of age and continues through 48 years of age.  Your health care provider may recommend screening at an earlier age if  you have risk factors for colon cancer.  Your health care provider may also recommend using home test kits to check for hidden blood in the stool.  A small camera at the end of a tube can be used to examine your colon directly (sigmoidoscopy or colonoscopy). This is done to check for the earliest forms of colorectal cancer.  Routine screening usually begins at age 69.  Direct examination of the colon should be repeated every 5-10 years through 48 years of age. However, you may need to be screened more often if early forms of precancerous polyps or small growths are found.  Skin Cancer  Check your skin from head to toe regularly.  Tell your health care provider about any new moles or changes in moles, especially if there is a change in a mole's shape or color.  Also tell your health care provider if you have a mole that is larger than  the size of a pencil eraser.  Always use sunscreen. Apply sunscreen liberally and repeatedly throughout the day.  Protect yourself by wearing long sleeves, pants, a wide-brimmed hat, and sunglasses whenever you are outside.  Heart disease, diabetes, and high blood pressure  High blood pressure causes heart disease and increases the risk of stroke. High blood pressure is more likely to develop in: ? People who have blood pressure in the high end of the normal range (130-139/85-89 mm Hg). ? People who are overweight or obese. ? People who are African American.  If you are 61-59 years of age, have your blood pressure checked every 3-5 years. If you are 61 years of age or older, have your blood pressure checked every year. You should have your blood pressure measured twice-once when you are at a hospital or clinic, and once when you are not at a hospital or clinic. Record the average of the two measurements. To check your blood pressure when you are not at a hospital or clinic, you can use: ? An automated blood pressure machine at a pharmacy. ? A home blood  pressure monitor.  If you are between 88 years and 47 years old, ask your health care provider if you should take aspirin to prevent strokes.  Have regular diabetes screenings. This involves taking a blood sample to check your fasting blood sugar level. ? If you are at a normal weight and have a low risk for diabetes, have this test once every three years after 48 years of age. ? If you are overweight and have a high risk for diabetes, consider being tested at a younger age or more often. Preventing infection Hepatitis B  If you have a higher risk for hepatitis B, you should be screened for this virus. You are considered at high risk for hepatitis B if: ? You were born in a country where hepatitis B is common. Ask your health care provider which countries are considered high risk. ? Your parents were born in a high-risk country, and you have not been immunized against hepatitis B (hepatitis B vaccine). ? You have HIV or AIDS. ? You use needles to inject street drugs. ? You live with someone who has hepatitis B. ? You have had sex with someone who has hepatitis B. ? You get hemodialysis treatment. ? You take certain medicines for conditions, including cancer, organ transplantation, and autoimmune conditions.  Hepatitis C  Blood testing is recommended for: ? Everyone born from [birth year] through 1966. ? Anyone with known risk factors for hepatitis C.  Sexually transmitted infections (STIs)  You should be screened for sexually transmitted infections (STIs) including gonorrhea and chlamydia if: ? You are sexually active and are younger than 48 years of age. ? You are older than 48 years of age and your health care provider tells you that you are at risk for this type of infection. ? Your sexual activity has changed since you were last screened and you are at an increased risk for chlamydia or gonorrhea. Ask your health care provider if you are at risk.  If you do not have HIV, but are at risk,  it may be recommended that you take a prescription medicine daily to prevent HIV infection. This is called pre-exposure prophylaxis (PrEP). You are considered at risk if: ? You are sexually active and do not regularly use condoms or know the HIV status of your partner(s). ? You take drugs by injection. ? You are sexually active with a  partner who has HIV.  Talk with your health care provider about whether you are at high risk of being infected with HIV. If you choose to begin PrEP, you should first be tested for HIV. You should then be tested every 3 months for as long as you are taking PrEP. Pregnancy  If you are premenopausal and you may become pregnant, ask your health care provider about preconception counseling.  If you may become pregnant, take 400 to 800 micrograms (mcg) of folic acid every day.  If you want to prevent pregnancy, talk to your health care provider about birth control (contraception). Osteoporosis and menopause  Osteoporosis is a disease in which the bones lose minerals and strength with aging. This can result in serious bone fractures. Your risk for osteoporosis can be identified using a bone density scan.  If you are 66 years of age or older, or if you are at risk for osteoporosis and fractures, ask your health care provider if you should be screened.  Ask your health care provider whether you should take a calcium or vitamin D supplement to lower your risk for osteoporosis.  Menopause may have certain physical symptoms and risks.  Hormone replacement therapy may reduce some of these symptoms and risks. Talk to your health care provider about whether hormone replacement therapy is right for you. Follow these instructions at home:  Schedule regular health, dental, and eye exams.  Stay current with your immunizations.  Do not use any tobacco products including cigarettes, chewing tobacco, or electronic cigarettes.  If you are pregnant, do not drink  alcohol.  If you are breastfeeding, limit how much and how often you drink alcohol.  Limit alcohol intake to no more than 1 drink per day for nonpregnant women. One drink equals 12 ounces of beer, 5 ounces of wine, or 1 ounces of hard liquor.  Do not use street drugs.  Do not share needles.  Ask your health care provider for help if you need support or information about quitting drugs.  Tell your health care provider if you often feel depressed.  Tell your health care provider if you have ever been abused or do not feel safe at home. This information is not intended to replace advice given to you by your health care provider. Make sure you discuss any questions you have with your health care provider. Document Released: 12/06/2010 Document Revised: 10/29/2015 Document Reviewed: 02/24/2015 Elsevier Interactive Patient Education  [employer].

## 2017-11-25 LAB — CBC WITH DIFFERENTIAL/PLATELET
Basophils Absolute: 31 cells/uL (ref 0–200)
Basophils Relative: 0.4 %
Eosinophils Absolute: 139 cells/uL (ref 15–500)
Eosinophils Relative: 1.8 %
HCT: 38.4 % (ref 35.0–45.0)
Hemoglobin: 13.3 g/dL (ref 11.7–15.5)
Lymphs Abs: 2972 cells/uL (ref 850–3900)
MCH: 29.8 pg (ref 27.0–33.0)
MCHC: 34.6 g/dL (ref 32.0–36.0)
MCV: 86.1 fL (ref 80.0–100.0)
MPV: 11.5 fL (ref 7.5–12.5)
Monocytes Relative: 4.8 %
Neutro Abs: 4189 cells/uL (ref 1500–7800)
Neutrophils Relative %: 54.4 %
Platelets: 185 10*3/uL (ref 140–400)
RBC: 4.46 10*6/uL (ref 3.80–5.10)
RDW: 12 % (ref 11.0–15.0)
Total Lymphocyte: 38.6 %
WBC mixed population: 370 cells/uL (ref 200–950)
WBC: 7.7 10*3/uL (ref 3.8–10.8)

## 2017-11-25 LAB — TSH: TSH: 0.35 mIU/L — ABNORMAL LOW

## 2017-11-26 DIAGNOSIS — E1129 Type 2 diabetes mellitus with other diabetic kidney complication: Secondary | ICD-10-CM | POA: Insufficient documentation

## 2017-11-26 DIAGNOSIS — Z0000 Encounter for general adult medical examination without abnormal findings: Secondary | ICD-10-CM | POA: Insufficient documentation

## 2017-11-26 DIAGNOSIS — J029 Acute pharyngitis, unspecified: Secondary | ICD-10-CM | POA: Insufficient documentation

## 2017-11-26 DIAGNOSIS — R809 Proteinuria, unspecified: Secondary | ICD-10-CM

## 2017-11-26 DIAGNOSIS — R7989 Other specified abnormal findings of blood chemistry: Secondary | ICD-10-CM | POA: Insufficient documentation

## 2017-11-26 NOTE — Assessment & Plan Note (Signed)
Continue losartan.    Lab Results  Component Value Date   CREATININE 0.87 07/27/2017   Lab Results  Component Value Date   NA 141 07/27/2017   K 3.9 07/27/2017   CL 102 07/27/2017   CO2 23 07/27/2017   Lab Results  Component Value Date   MICROALBUR 11.5 (H) 11/20/2017

## 2017-11-26 NOTE — Assessment & Plan Note (Signed)
TSH is suppressed.  Given pharyngitis  Will order [place] and thyroid antibodies.

## 2017-11-26 NOTE — Assessment & Plan Note (Addendum)
Will address elevated fasting sugars  With initiation of NPH in the evening starting dose 5 units ,  Will increase her dose of insulin  weekly by 5 units until fasting sugars are < 140.  Once fastings are under control will turn attention to post prandial sugars.  Foot exam is normal.  Reminder for annual diabetic eye exam given,.  Lab Results  Component Value Date   HGBA1C 9.2 (H) 11/20/2017   Lab Results  Component Value Date   MICROALBUR 11.5 (H) 11/20/2017

## 2017-11-26 NOTE — Assessment & Plan Note (Signed)
Annual comprehensive preventive exam was done as well as an evaluation and management of chronic conditions . During the course of the visit the patient was educated and counseled about appropriate screening and preventive services including :  diabetes screening, lipid analysis with projected  10 year  risk for CAD , nutrition counseling, breast, cervical and colorectal cancer screening, and recommended immunizations.  She is s/p TAH for noncancerous diagnosis, Her mammogram and annual breast exam is done at [facility]  Dr Roger Kill    Printed recommendations for health maintenance screenings was given

## 2017-11-26 NOTE — Assessment & Plan Note (Addendum)
.    HEENT exam normal,  May be having snoring, , reflux,  Or PND .  Given suppressed Tsh will order neck US

## 2017-11-27 ENCOUNTER — Encounter (INDEPENDENT_AMBULATORY_CARE_PROVIDER_SITE_OTHER): Payer: Self-pay

## 2017-11-27 ENCOUNTER — Encounter: Payer: Self-pay | Admitting: Radiology

## 2017-11-30 ENCOUNTER — Ambulatory Visit (HOSPITAL_COMMUNITY)
Admission: RE | Admit: 2017-11-30 | Discharge: 2017-11-30 | Disposition: A | Discharge status: Home / Self Care | Payer: 59 | Source: Ambulatory Visit | Attending: Internal Medicine | Admitting: Internal Medicine

## 2017-11-30 DIAGNOSIS — J029 Acute pharyngitis, unspecified: Secondary | ICD-10-CM | POA: Diagnosis not present

## 2017-11-30 DIAGNOSIS — E042 Nontoxic multinodular goiter: Secondary | ICD-10-CM | POA: Diagnosis not present

## 2017-11-30 DIAGNOSIS — R7989 Other specified abnormal findings of blood chemistry: Secondary | ICD-10-CM | POA: Diagnosis not present

## 2017-11-30 NOTE — Telephone Encounter (Signed)
Radiology dept, called and states the order for the ultrasound needs to be changed to a thyroid U/S, pt will be there for an appt in about 64mins

## 2017-12-05 ENCOUNTER — Encounter: Payer: Self-pay | Admitting: Internal Medicine

## 2017-12-05 ENCOUNTER — Other Ambulatory Visit: Payer: Self-pay | Admitting: Internal Medicine

## 2017-12-05 DIAGNOSIS — E042 Nontoxic multinodular goiter: Secondary | ICD-10-CM | POA: Insufficient documentation

## 2017-12-06 MED FILL — METOPROLOL TARTRATE 25 MG T: 25 | 90 days supply | Qty: 45 | Fill #3

## 2017-12-15 ENCOUNTER — Ambulatory Visit: Payer: 59 | Admitting: Physician Assistant

## 2017-12-15 ENCOUNTER — Encounter: Payer: Self-pay | Admitting: Physician Assistant

## 2017-12-15 VITALS — BP 110/75 | HR 72 | Wt 225.0 lb

## 2017-12-15 DIAGNOSIS — G43709 Chronic migraine without aura, not intractable, without status migrainosus: Secondary | ICD-10-CM | POA: Diagnosis not present

## 2017-12-15 DIAGNOSIS — IMO0002 Reserved for concepts with insufficient information to code with codable children: Secondary | ICD-10-CM

## 2017-12-15 DIAGNOSIS — G44309 Post-traumatic headache, unspecified, not intractable: Secondary | ICD-10-CM

## 2017-12-15 MED ORDER — GABAPENTIN 100 MG PO CAPS: 600.0000 mg | ORAL_CAPSULE | Freq: Two times a day (BID) | ORAL | 3 refills | Status: DC

## 2017-12-15 MED FILL — GABAPENTIN 100 MG CAP: 100 | 30 days supply | Qty: 360 | Fill #0

## 2017-12-15 NOTE — Patient Instructions (Signed)
Post-Concussion Syndrome Post-concussion syndrome is the symptoms that can occur after a head injury. These symptoms can last from weeks to months. Follow these instructions at home:  Take medicines only as told by your doctor.  Do not take aspirin.  Sleep with your head raised to help with headaches.  Avoid activities that can cause another head injury. ? Do not play contact sports like football, hockey, soccer, or basketball. ? Do not do other risky activities like downhill skiing, martial arts, or horseback riding until your doctor says it is okay.  Keep all follow-up visits as told by your doctor. This is important. Contact a doctor if:  You have a harder time: ? Paying attention. ? Focusing. ? Remembering. ? Learning new information. ? Dealing with stress.  You need more time to complete tasks.  You are easily bothered (irritable).  You have more symptoms. Get help if you have any of these symptoms for more than two weeks after your injury:  Long-lasting (chronic) headaches.  Dizziness.  Trouble balancing.  Feeling sick to your stomach (nauseous).  Trouble with your vision.  Noise or light bothers you more.  Depression.  Mood swings.  Feeling worried (anxious).  Easily bothered.  Memory problems.  Trouble concentrating or paying attention.  Sleep problems.  Feeling tired all of the time.  Get help right away if:  You feel confused.  You feel very sleepy.  You are hard to wake up.  You feel sick to your stomach.  You keep throwing up (vomiting).  You feel like you are moving when you are not (vertigo).  Your eyes move back and forth very quickly.  You start shaking (convulsing) or pass out (faint).  You have very bad headaches that do not get better with medicine.  You cannot use your arms or legs like normal.  One of the black centers of your eyes (pupils) is bigger than the other.  You have clear or bloody fluid coming from your  nose or ears.  Your problems get worse, not better. This information is not intended to replace advice given to you by your health care provider. Make sure you discuss any questions you have with your health care provider. Document Released: 06/30/2004 Document Revised: 10/29/2015 Document Reviewed: 08/28/2013 Elsevier Interactive Patient Education  2018 [employer]  
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
## 2017-12-20 MED FILL — LOSARTAN-HCTZ 50-12.5 MG TA: 50-12.5 | 90 days supply | Qty: 90 | Fill #1

## 2017-12-20 MED FILL — EMGALITY 120 MG/ML SOAJ: 120 | 30 days supply | Qty: 1 | Fill #6

## 2017-12-25 ENCOUNTER — Encounter: Payer: Self-pay | Admitting: Physician Assistant

## 2017-12-27 ENCOUNTER — Other Ambulatory Visit: Payer: Self-pay | Admitting: Internal Medicine

## 2017-12-28 MED FILL — DILTIAZEM ER 240 MG TABLET: 240 | 90 days supply | Qty: 90 | Fill #0

## 2017-12-29 ENCOUNTER — Encounter: Payer: Self-pay | Admitting: Internal Medicine

## 2017-12-29 MED FILL — ULTICARE SYR 0.3 ML 30GX5/1: 30G X 5/16" | 30 days supply | Qty: 60 | Fill #1

## 2017-12-29 MED FILL — HumuLIN N 100 UNIT/ML SUSP: 100 | 28 days supply | Qty: 10 | Fill #1

## 2018-01-03 ENCOUNTER — Other Ambulatory Visit: Payer: Self-pay | Admitting: Internal Medicine

## 2018-01-04 MED FILL — ATORVASTATIN CALCIUM 20 MG: 20 | 90 days supply | Qty: 90 | Fill #0

## 2018-01-04 MED FILL — KETOROLAC 10 MG TABLET: 10 | 5 days supply | Qty: 20 | Fill #0

## 2018-01-12 ENCOUNTER — Encounter: Payer: Self-pay | Admitting: Physician Assistant

## 2018-01-18 ENCOUNTER — Encounter (INDEPENDENT_AMBULATORY_CARE_PROVIDER_SITE_OTHER): Payer: 59

## 2018-01-22 MED FILL — EMGALITY 120 MG/ML SOAJ: 120 | 30 days supply | Qty: 1 | Fill #7

## 2018-01-23 ENCOUNTER — Encounter (INDEPENDENT_AMBULATORY_CARE_PROVIDER_SITE_OTHER): Payer: Self-pay | Admitting: Family Medicine

## 2018-01-23 ENCOUNTER — Ambulatory Visit (INDEPENDENT_AMBULATORY_CARE_PROVIDER_SITE_OTHER): Payer: 59 | Admitting: Family Medicine

## 2018-01-23 VITALS — BP 114/77 | HR 70 | Ht 66.0 in | Wt 216.0 lb

## 2018-01-23 DIAGNOSIS — E669 Obesity, unspecified: Secondary | ICD-10-CM | POA: Diagnosis not present

## 2018-01-23 DIAGNOSIS — Z794 Long term (current) use of insulin: Secondary | ICD-10-CM | POA: Diagnosis not present

## 2018-01-23 DIAGNOSIS — R0602 Shortness of breath: Secondary | ICD-10-CM | POA: Insufficient documentation

## 2018-01-23 DIAGNOSIS — Z0289 Encounter for other administrative examinations: Secondary | ICD-10-CM

## 2018-01-23 DIAGNOSIS — E1142 Type 2 diabetes mellitus with diabetic polyneuropathy: Secondary | ICD-10-CM | POA: Insufficient documentation

## 2018-01-23 DIAGNOSIS — Z1331 Encounter for screening for depression: Secondary | ICD-10-CM | POA: Diagnosis not present

## 2018-01-23 DIAGNOSIS — E119 Type 2 diabetes mellitus without complications: Secondary | ICD-10-CM | POA: Diagnosis not present

## 2018-01-23 DIAGNOSIS — Z6834 Body mass index (BMI) 34.0-34.9, adult: Secondary | ICD-10-CM

## 2018-01-23 DIAGNOSIS — Z9189 Other specified personal risk factors, not elsewhere classified: Secondary | ICD-10-CM

## 2018-01-23 DIAGNOSIS — R5383 Other fatigue: Secondary | ICD-10-CM | POA: Insufficient documentation

## 2018-01-23 MED ORDER — GLUCOSE BLOOD VI STRP: ORAL_STRIP | 0 refills | Status: DC

## 2018-01-23 MED ORDER — FREESTYLE LANCETS MISC: 0 refills | Status: DC

## 2018-01-23 MED ORDER — FREESTYLE LITE DEVI: 1.0000 | Freq: Two times a day (BID) | 0 refills | Status: DC

## 2018-01-23 MED FILL — ACCU-CHEK GUIDE STRP: 50 days supply | Qty: 100 | Fill #0

## 2018-01-23 MED FILL — ACCU-CHEK FASTCLIX LANCETS: 51 days supply | Qty: 102 | Fill #0

## 2018-01-23 MED FILL — ACCU-CHEK GUIDE W/DEVICE KI: W/DEVICE | 30 days supply | Qty: 1 | Fill #0

## 2018-01-23 NOTE — Progress Notes (Signed)
Office: [phone]  /  Fax: [phone]   Dear Dr. Derrel Nip,   Thank you for referring [name] to our clinic. The following note includes my evaluation and treatment recommendations.  HPI:   Chief Complaint: OBESITY    [name] has been referred by Helene Kelp L. Derrel Nip, MD for consultation regarding her obesity and obesity related comorbidities.    [name] (MR# [id number]) is a 47 y.o. female who presents on 01/23/2018 for obesity evaluation and treatment. Current BMI is Body mass index is 34.86 kg/m.[name] [name] has been struggling with her weight for many years and has been unsuccessful in either losing weight, maintaining weight loss, or reaching her healthy weight goal.     [name] attended our information session and states she is currently in the action stage of change and ready to dedicate time achieving and maintaining a healthier weight. [name] is interested in becoming our patient and working on intensive lifestyle modifications including (but not limited to) diet, exercise and weight loss.    [name] states her family eats meals together she thinks her family will eat healthier with  her her desired weight loss is 37 lbs she has been heavy most of  her life she started gaining weight with each pregnancy her heaviest weight ever was 226 lbs. she is a picky eater and doesn't like to eat healthier foods  she has significant food cravings issues  she skips meals frequently she struggles with emotional eating    Fatigue [name] feels her energy is lower than it should be. This has worsened with weight gain and has not worsened recently. [name] admits to daytime somnolence and admits to waking up still tired. Patient is at risk for obstructive sleep apnea. Patent has a history of symptoms of daytime fatigue, morning fatigue and morning headache. Patient generally gets 4 or 5 hours of sleep per night, and states they generally have restful sleep. Snoring is present.  Apneic episodes are not present. Epworth Sleepiness Score is 5  Dyspnea on exertion [name] notes increasing shortness of breath with exercising and seems to be worsening over time with weight gain. She notes getting out of breath sooner with activity than she used to. This has not gotten worse recently. [name] denies orthopnea.  Diabetes II on insulin [name] has a diagnosis of diabetes type II. [name] denies any hypoglycemic episodes. Last A1c was at 9.4 and is uncontrolled on Humulin and Metformin. She is not on Trulicity due to thyroid nodules. She had been on Victoza in the past, but she stopped due to insurance coverage of Victoza. She has been working on intensive lifestyle modifications including diet, exercise, and weight loss to help control her blood glucose levels.  At risk for cardiovascular disease [name] is at a higher than average risk for cardiovascular disease due to obesity and diabetes. She currently denies any chest pain.  Depression Screen [name]'s Food and Mood (modified PHQ-9) score was  Depression screen PHQ 2/9 01/23/2018  Decreased Interest 0  Down, Depressed, Hopeless 0  PHQ - 2 Score 0  Altered sleeping 0  Tired, decreased energy 0  Change in appetite 1  Feeling bad or failure about yourself  1  Trouble concentrating 0  Moving slowly or fidgety/restless 0  Suicidal thoughts 0  PHQ-9 Score 2  Difficult doing work/chores Not difficult at all  Some recent data might be hidden    ALLERGIES:  MEDICATIONS: Current Outpatient Medications on File Prior to Visit  Medication Sig Dispense  Refill  . ALPRAZolam (XANAX) 0.5 MG tablet Take 0.5 mg by mouth 3 (three) times daily as needed for anxiety.    [name] aspirin EC 81 MG tablet Take 81 mg by mouth daily.    [name] atorvastatin (LIPITOR) 20 MG tablet TAKE 1 TABLET BY MOUTH DAILY. 90 tablet 1  . butalbital-acetaminophen-caffeine (FIORICET, ESGIC) 50-325-40 MG tablet Take 1 tablet by mouth every 6 (six) hours as needed for  headache. Do not refill in less than 30 day 12 tablet 5  . diazepam (VALIUM) 10 MG tablet Take 1 tablet (10 mg total) by mouth every 12 (twelve) hours as needed (muscle spasm). 30 tablet 3  . diltiazem (CARDIZEM LA) 240 MG 24 hr tablet TAKE 1 TABLET BY MOUTH DAILY 90 tablet 1  . EPINEPHrine 0.3 mg/0.3 mL IJ SOAJ injection Inject 0.3 mg into the muscle as needed (for allergic reaction).   3  . gabapentin (NEURONTIN) 100 MG capsule Take 6 capsules (600 mg total) by mouth 2 (two) times daily. 360 capsule 3  . Galcanezumab-gnlm (EMGALITY) 120 MG/ML SOAJ Inject 240 mg into the skin as directed AND 120 mg every 30 (thirty) days. Inj 240mg  once then 120mg  monthly. 1 pen 11  . insulin NPH Human (HUMULIN N) 100 UNIT/ML injection Inject 0.05 mLs (5 Units total) into the skin 2 (two) times daily before a meal. 10 mL 11  . Insulin Syringes, Disposable, U-100 1 ML MISC Use with NPH insulin 60 each 11  . ketorolac (TORADOL) 10 MG tablet Take 1 tablet (10 mg total) by mouth every 6 (six) hours as needed. 30 tablet 1  . losartan-hydrochlorothiazide (HYZAAR) 50-12.5 MG tablet TAKE 1 TABLET BY MOUTH DAILY 90 tablet 2  . meclizine (ANTIVERT) 25 MG tablet Take 25 mg by mouth 3 (three) times daily as needed for dizziness.    . metFORMIN (GLUCOPHAGE) 500 MG tablet Take 500 mg by mouth 2 (two) times daily with a meal.    . methocarbamol (ROBAXIN) 500 MG tablet Take 500 mg by mouth every 6 (six) hours as needed for muscle spasms.    . metoprolol succinate (TOPROL-XL) 25 MG 24 hr tablet Take 12.5 mg by mouth daily.    . ondansetron (ZOFRAN ODT) 4 MG disintegrating tablet Take 1 tablet (4 mg total) by mouth every 8 (eight) hours as needed for nausea or vomiting. 20 tablet 0  . oxyCODONE-acetaminophen (ROXICET) 5-325 MG tablet Take 1-2 tablets by mouth every 4 (four) hours as needed for severe pain. 40 tablet 0  . spironolactone (ALDACTONE) 25 MG tablet Take 1 tablet (25 mg total) by mouth daily. As needed for fluid retention  30 tablet 3   No current facility-administered medications on file prior to visit.     PAST MEDICAL HISTORY: Past Medical History:  Diagnosis Date  . Allergy   . Anemia   . Anxiety    claustrophobic  . Asthma   . Back pain   . Biceps tendonosis of right shoulder   . Diabetes mellitus 2011   did not start metforfin, losing weight  . Dyspnea   . Fatty liver   . Food allergy   . Headache disorder   . History of concussion   . HLD (hyperlipidemia)   . Hypertension   . IBS (irritable bowel syndrome)   . Infertility, female   . Joint pain   . Lactose intolerance   . Leg edema   . Migraines   . Other specified disorders of thyroid   .  Palpitation   . Post-menopausal   . Seborrheic dermatitis   . Shoulder impingement syndrome, right   . Vitamin D deficiency     PAST SURGICAL HISTORY: Past Surgical History:  Procedure Laterality Date  . ABDOMINAL HYSTERECTOMY  2006   heavy menses, endometriosis, l oophrectomy  . BACK SURGERY    . BREAST BIOPSY Right 2018   benign  . BREAST EXCISIONAL BIOPSY Right 2003   benign  . BREAST EXCISIONAL BIOPSY Right 1999   benign  . BREAST SURGERY     right breast x 2 , benign  . HERNIA REPAIR  2003   left inguinal   . LEFT OOPHORECTOMY    . SHOULDER ARTHROSCOPY WITH SUBACROMIAL DECOMPRESSION AND OPEN ROTATOR C Right 07/14/2016   Procedure: RIGHT SHOULDER ARTHROSCOPY WITH SUBACROMIAL DECOMPRESSION, DISTAL CLAVICLE RESECTION AND MINI OPEN ROTATOR CUFF REPAIR, OPEN BICEP TENDODESIS;  Surgeon: Garald Balding, MD;  Location: [place];  Service: Orthopedics;  Laterality: Right;  . SHOULDER CLOSED REDUCTION Right 09/08/2016   Procedure: RIGHT CLOSED MANIPULATION SHOULDER;  Surgeon: Garald Balding, MD;  Location: [place];  Service: Orthopedics;  Laterality: Right;  . TUBAL LIGATION      SOCIAL HISTORY: Social History   Tobacco Use  . Smoking status: Never Smoker  . Smokeless tobacco: Never Used    Substance Use Topics  . Alcohol use: No  . Drug use: No    FAMILY HISTORY: Family History  Problem Relation Age of Onset  . Diabetes Mother   . Heart disease Mother 45  . Hypertension Mother   . Hyperlipidemia Mother   . Heart failure Mother   . Stroke Mother   . Thyroid disease Mother   . Depression Mother   . Sleep apnea Mother   . Obesity Mother   . Cancer Father 67       Lung Cancer  . Heart disease Father   . Mental illness Sister        bipolar, substance abuse,  clean 2 yrs  . Hypertension Sister   . Cancer Paternal Grandmother 67       breast cancer  . Breast cancer Paternal Grandmother   . Diabetes Maternal Grandmother   . Hypertension Maternal Grandmother   . Diabetes Son     ROS: Review of Systems  Constitutional: Positive for malaise/fatigue.  Eyes: Positive for pain.       Wear Glasses or Contacts Flashes of Light Floaters  Respiratory: Positive for shortness of breath (on exertion).   Cardiovascular: Negative for chest pain and orthopnea.       Leg Cramping  Musculoskeletal:       Muscle or Joint Pain  Skin: Positive for rash.       Dryness   Neurological: Positive for weakness and headaches.  Endo/Heme/Allergies:       Heat or Cold Intolerance Negative for hypoglycemia    PHYSICAL EXAM: Blood pressure 114/77, pulse 70, height 5\' 6"  (1.676 m), weight 216 lb (98 kg), SpO2 99 %. Body mass index is 34.86 kg/m. Physical Exam  Constitutional: She is oriented to person, place, and time. She appears well-developed and well-nourished.  HENT:  Head: Normocephalic and atraumatic.  Nose: Nose normal.  Eyes: EOM are normal. No scleral icterus.  Neck: Normal range of motion. Neck supple. No thyromegaly present.  Cardiovascular: Normal rate and regular rhythm.  Pulmonary/Chest: Effort normal. No respiratory distress.  Abdominal: Soft. There is no tenderness.  + obesity  Musculoskeletal: Normal  range of motion. She exhibits no edema.  Range of  Motion normal in all 4 extremities  Neurological: She is oriented to person, place, and time. Coordination normal.  Skin: Skin is warm and dry.  Psychiatric: She has a normal mood and affect. Her behavior is normal.  Vitals reviewed.   RECENT LABS AND TESTS: BMET    Component Value Date/Time   NA 141 07/27/2017 0000   NA 141 06/14/2012 1524   K 3.9 07/27/2017 0000   K 3.6 06/14/2012 1524   CL 102 07/27/2017 0000   CL 110 (H) 06/14/2012 1524   CO2 23 07/27/2017 0000   CO2 27 06/14/2012 1524   GLUCOSE 165 (H) 07/27/2017 0000   GLUCOSE 161 (H) 06/20/2017 2002   GLUCOSE 88 06/14/2012 1524   BUN 16 07/27/2017 0000   BUN 17 06/14/2012 1524   CREATININE 0.87 07/27/2017 0000   CREATININE 0.83 06/14/2013 1608   CALCIUM 9.6 07/27/2017 0000   CALCIUM 9.0 06/14/2012 1524   GFRNONAA 80 07/27/2017 0000   GFRNONAA >60 06/14/2012 1524   GFRAA 92 07/27/2017 0000   GFRAA >60 06/14/2012 1524   Lab Results  Component Value Date   HGBA1C 9.2 (H) 11/20/2017   No results found for: INSULIN CBC    Component Value Date/Time   WBC 7.7 11/24/2017 1612   RBC 4.46 11/24/2017 1612   HGB 13.3 11/24/2017 1612   HGB 12.7 06/14/2012 1524   HCT 38.4 11/24/2017 1612   HCT 37.8 06/14/2012 1524   PLT 185 11/24/2017 1612   PLT 172 06/14/2012 1524   MCV 86.1 11/24/2017 1612   MCV 90 06/14/2012 1524   MCH 29.8 11/24/2017 1612   MCHC 34.6 11/24/2017 1612   RDW 12.0 11/24/2017 1612   RDW 13.3 06/14/2012 1524   LYMPHSABS 2,972 11/24/2017 1612   MONOABS 0.7 06/14/2013 1608   EOSABS 139 11/24/2017 1612   BASOSABS 31 11/24/2017 1612   Iron/TIBC/Ferritin/ %Sat No results found for: IRON, TIBC, FERRITIN, IRONPCTSAT Lipid Panel     Component Value Date/Time   CHOL 139 07/27/2017 0000   TRIG 181 (H) 07/27/2017 0000   HDL 35 (L) 07/27/2017 0000   CHOLHDL 4.0 07/27/2017 0000   CHOLHDL 4 03/22/2017 1026   VLDL 22.2 03/22/2017 1026   LDLCALC 68 07/27/2017 0000   LDLDIRECT 98.0 08/17/2016 1555    Hepatic Function Panel     Component Value Date/Time   PROT 7.3 11/20/2017 0808   PROT 7.6 07/27/2017 0000   PROT 7.8 06/14/2012 1524   ALBUMIN 4.3 11/20/2017 0808   ALBUMIN 4.7 07/27/2017 0000   ALBUMIN 4.0 06/14/2012 1524   AST 15 11/20/2017 0808   AST 17 06/14/2012 1524   ALT 19 11/20/2017 0808   ALT 19 06/14/2012 1524   ALKPHOS 122 (H) 11/20/2017 0808   ALKPHOS 101 06/14/2012 1524   BILITOT 0.5 11/20/2017 0808   BILITOT 0.4 07/27/2017 0000   BILITOT 0.3 06/14/2012 1524   BILIDIR 0.1 11/20/2017 0808      Component Value Date/Time   TSH 0.35 (L) 11/24/2017 1612   TSH 1.06 11/07/2016 1831   TSH 0.619 10/16/2014 0902   Results for SHAKERA, EBRAHIMI (MRN [id number]) as of 01/23/2018 09:12  Ref. Range 05/06/2013 17:15  Vitamin D, 25-Hydroxy Latest Ref Range: 30 - 89 ng/mL 18 (L)    ECG  shows NSR with a rate of 66 BPM INDIRECT CALORIMETER done today shows a VO2 of 289 and a REE of 2011.  Her calculated basal  metabolic rate is 8756 thus her basal metabolic rate is better than expected.    ASSESSMENT AND PLAN: Other fatigue - Plan: EKG 12-Lead, Vitamin B12, CBC With Differential, Folate, Lipid Panel With LDL/HDL Ratio, T3, T4, free, TSH, VITAMIN D 25 Hydroxy (Vit-D Deficiency, Fractures)  Shortness of breath on exertion - Plan: CBC With Differential  Type 2 diabetes mellitus without complication, with long-term current use of insulin (HCC) - Plan: Hemoglobin A1c, Comprehensive metabolic panel, Insulin, random  Depression screening  At risk for heart disease  Class 1 obesity with serious comorbidity and body mass index (BMI) of 34.0 to 34.9 in adult, unspecified obesity type  PLAN: Fatigue Hong was informed that her fatigue may be related to obesity, depression or many other causes. Labs will be ordered, and in the meanwhile Ayannah has agreed to work on diet, exercise and weight loss to help with fatigue. Proper sleep hygiene was discussed including the need for 7-8  hours of quality sleep each night. A sleep study was not ordered based on symptoms and Epworth score.  Dyspnea on exertion Shlonda's shortness of breath appears to be obesity related and exercise induced. She has agreed to work on weight loss and gradually increase exercise to treat her exercise induced shortness of breath. If Alexya follows our instructions and loses weight without improvement of her shortness of breath, we will plan to refer to pulmonology. We will monitor this condition regularly. Halana agrees to this plan.  Diabetes II on insulin Fran has been given extensive diabetes education by myself today including ideal fasting and post-prandial blood glucose readings, individual ideal Hgb A1c goals and hypoglycemia prevention. We discussed the importance of good blood sugar control to decrease the likelihood of diabetic complications such as nephropathy, neuropathy, limb loss, blindness, coronary artery disease, and death. We discussed the importance of intensive lifestyle modification including diet, exercise and weight loss as the first line treatment for diabetes. We will check labs and Torey agrees to start diet. Patte agrees to continue her diabetes medications and will follow up at the agreed upon time.  Cardiovascular risk counseling Jariana was given extended (15 minutes) coronary artery disease prevention counseling today. She is 47 y.o. female and has risk factors for heart disease including obesity and diabetes. We discussed intensive lifestyle modifications today with an emphasis on specific weight loss instructions and strategies. Pt was also informed of the importance of increasing exercise and decreasing saturated fats to help prevent heart disease.  Depression Screen Yisell had a negative depression screening. Depression is commonly associated with obesity and often results in emotional eating behaviors. We will monitor this closely and work on CBT to help improve the  non-hunger eating patterns.   Obesity Azari is currently in the action stage of change and her goal is to continue with weight loss efforts. I recommend Aphrodite begin the structured treatment plan as follows:  She has agreed to follow the Category 3 plan Alyse has been instructed to eventually work up to a goal of 150 minutes of combined cardio and strengthening exercise per week for weight loss and overall health benefits. We discussed the following Behavioral Modification Strategies today: increasing lean protein intake, decreasing simple carbohydrates , decrease eating out and work on meal planning and easy cooking plans   She was informed of the importance of frequent follow up visits to maximize her success with intensive lifestyle modifications for her multiple health conditions. She was informed we would discuss her lab results at her next  visit unless there is a critical issue that needs to be addressed sooner. Barbette agreed to keep her next visit at the agreed upon time to discuss these results.    OBESITY BEHAVIORAL INTERVENTION VISIT  Today's visit was # 1 out of 22.  Starting weight: 216 lbs Starting date: 01/23/18 Today's weight : 216 lbs  Today's date: 01/23/2018 Total lbs lost to date: 0 (Patients must lose 7 lbs in the first 6 months to continue with counseling)   ASK: We discussed the diagnosis of obesity with [name] today and Tu agreed to give [place] permission to discuss obesity behavioral modification therapy today.  ASSESS: Dorna has the diagnosis of obesity and her BMI today is 34.88 Devann is in the action stage of change   ADVISE: Fanta was educated on the multiple health risks of obesity as well as the benefit of weight loss to improve her health. She was advised of the need for long term treatment and the importance of lifestyle modifications.  AGREE: Multiple dietary modification options and treatment options were discussed and  Sible agreed  to the above obesity treatment plan.   I, Doreene Nest, am acting as transcriptionist for  Dennard Nip, MD   I have reviewed the above documentation for accuracy and completeness, and I agree with the above. -Dennard Nip, MD

## 2018-01-24 LAB — VITAMIN D 25 HYDROXY (VIT D DEFICIENCY, FRACTURES): Vit D, 25-Hydroxy: 20.9 ng/mL — ABNORMAL LOW (ref 30.0–100.0)

## 2018-01-24 LAB — COMPREHENSIVE METABOLIC PANEL
ALT: 25 IU/L (ref 0–32)
AST: 18 IU/L (ref 0–40)
Albumin/Globulin Ratio: 2 (ref 1.2–2.2)
Albumin: 4.5 g/dL (ref 3.5–5.5)
Alkaline Phosphatase: 159 IU/L — ABNORMAL HIGH (ref 39–117)
BUN/Creatinine Ratio: 16 (ref 9–23)
BUN: 13 mg/dL (ref 6–24)
Bilirubin Total: 0.4 mg/dL (ref 0.0–1.2)
CO2: 21 mmol/L (ref 20–29)
Calcium: 9.4 mg/dL (ref 8.7–10.2)
Chloride: 100 mmol/L (ref 96–106)
Creatinine, Ser: 0.82 mg/dL (ref 0.57–1.00)
GFR calc Af Amer: 98 mL/min/{1.73_m2} (ref >59)
GFR calc non Af Amer: 85 mL/min/{1.73_m2} (ref >59)
Globulin, Total: 2.2 g/dL (ref 1.5–4.5)
Glucose: 231 mg/dL — ABNORMAL HIGH (ref 65–99)
Potassium: 4.1 mmol/L (ref 3.5–5.2)
Sodium: 139 mmol/L (ref 134–144)
Total Protein: 6.7 g/dL (ref 6.0–8.5)

## 2018-01-24 LAB — CBC WITH DIFFERENTIAL
Basophils Absolute: 0 10*3/uL (ref 0.0–0.2)
Basos: 0 %
EOS (ABSOLUTE): 0.2 10*3/uL (ref 0.0–0.4)
Eos: 2 %
Hematocrit: 40.9 % (ref 34.0–46.6)
Hemoglobin: 13.1 g/dL (ref 11.1–15.9)
Immature Grans (Abs): 0 10*3/uL (ref 0.0–0.1)
Immature Granulocytes: 0 %
Lymphocytes Absolute: 2.8 10*3/uL (ref 0.7–3.1)
Lymphs: 38 %
MCH: 28.8 pg (ref 26.6–33.0)
MCHC: 32 g/dL (ref 31.5–35.7)
MCV: 90 fL (ref 79–97)
Monocytes Absolute: 0.3 10*3/uL (ref 0.1–0.9)
Monocytes: 4 %
Neutrophils Absolute: 4 10*3/uL (ref 1.4–7.0)
Neutrophils: 56 %
RBC: 4.55 x10E6/uL (ref 3.77–5.28)
RDW: 13.2 % (ref 12.3–15.4)
WBC: 7.2 10*3/uL (ref 3.4–10.8)

## 2018-01-24 LAB — LIPID PANEL WITH LDL/HDL RATIO
Cholesterol, Total: 130 mg/dL (ref 100–199)
HDL: 35 mg/dL — ABNORMAL LOW (ref >39)
LDL Calculated: 78 mg/dL (ref 0–99)
LDl/HDL Ratio: 2.2 ratio (ref 0.0–3.2)
Triglycerides: 87 mg/dL (ref 0–149)
VLDL Cholesterol Cal: 17 mg/dL (ref 5–40)

## 2018-01-24 LAB — HEMOGLOBIN A1C
Est. average glucose Bld gHb Est-mCnc: 240 mg/dL
Hgb A1c MFr Bld: 10 % — ABNORMAL HIGH (ref 4.8–5.6)

## 2018-01-24 LAB — FOLATE: Folate: 12.8 ng/mL (ref >3.0)

## 2018-01-24 LAB — INSULIN, RANDOM: INSULIN: 26.5 u[IU]/mL — ABNORMAL HIGH (ref 2.6–24.9)

## 2018-01-24 LAB — T3: T3, Total: 111 ng/dL (ref 71–180)

## 2018-01-24 LAB — TSH: TSH: 0.935 u[IU]/mL (ref 0.450–4.500)

## 2018-01-24 LAB — VITAMIN B12: Vitamin B-12: 576 pg/mL (ref 232–1245)

## 2018-01-24 LAB — T4, FREE: Free T4: 1.13 ng/dL (ref 0.82–1.77)

## 2018-01-25 DIAGNOSIS — H5203 Hypermetropia, bilateral: Secondary | ICD-10-CM | POA: Diagnosis not present

## 2018-01-25 LAB — HM DIABETES EYE EXAM

## 2018-01-25 MED FILL — ULTICARE SYR 0.3 ML 30GX5/1: 30G X 5/16" | 30 days supply | Qty: 60 | Fill #2

## 2018-01-25 MED FILL — HumuLIN N 100 UNIT/ML SUSP: 100 | 28 days supply | Qty: 10 | Fill #2

## 2018-01-29 ENCOUNTER — Other Ambulatory Visit: Payer: Self-pay | Admitting: Internal Medicine

## 2018-01-29 DIAGNOSIS — Z1231 Encounter for screening mammogram for malignant neoplasm of breast: Secondary | ICD-10-CM

## 2018-01-31 DIAGNOSIS — E119 Type 2 diabetes mellitus without complications: Secondary | ICD-10-CM

## 2018-01-31 DIAGNOSIS — Z794 Long term (current) use of insulin: Secondary | ICD-10-CM

## 2018-02-06 ENCOUNTER — Telehealth: Payer: Self-pay | Admitting: Internal Medicine

## 2018-02-06 DIAGNOSIS — E1165 Type 2 diabetes mellitus with hyperglycemia: Secondary | ICD-10-CM

## 2018-02-06 NOTE — Telephone Encounter (Signed)
Chart updated

## 2018-02-06 NOTE — Assessment & Plan Note (Signed)
Patient  is now participating in the [facility] platform for Type II diabetes self management which will provide clinical assistance in the platform.[name] support will  generate a CBG report which will be scannedt into Epic under the media tab of patient's  EMR

## 2018-02-07 ENCOUNTER — Ambulatory Visit (INDEPENDENT_AMBULATORY_CARE_PROVIDER_SITE_OTHER): Payer: 59 | Admitting: Family Medicine

## 2018-02-07 VITALS — BP 103/71 | HR 66 | Temp 98.2°F | Ht 66.0 in | Wt 216.0 lb

## 2018-02-07 DIAGNOSIS — E559 Vitamin D deficiency, unspecified: Secondary | ICD-10-CM | POA: Diagnosis not present

## 2018-02-07 DIAGNOSIS — Z6835 Body mass index (BMI) 35.0-35.9, adult: Secondary | ICD-10-CM | POA: Diagnosis not present

## 2018-02-07 DIAGNOSIS — Z9189 Other specified personal risk factors, not elsewhere classified: Secondary | ICD-10-CM

## 2018-02-07 DIAGNOSIS — E1165 Type 2 diabetes mellitus with hyperglycemia: Secondary | ICD-10-CM | POA: Diagnosis not present

## 2018-02-07 DIAGNOSIS — Z794 Long term (current) use of insulin: Secondary | ICD-10-CM | POA: Diagnosis not present

## 2018-02-07 MED ORDER — VITAMIN D (ERGOCALCIFEROL) 1.25 MG (50000 UNIT) PO CAPS: 50000.0000 [IU] | ORAL_CAPSULE | ORAL | 0 refills | Status: DC

## 2018-02-07 MED FILL — IMIPRAMINE HCL 50 MG TABLET: 50 | 30 days supply | Qty: 30 | Fill #0

## 2018-02-07 MED FILL — BUTALB-ACETAMIN-CAFF 50-325: 50-325-40 | 30 days supply | Qty: 12 | Fill #1

## 2018-02-07 MED FILL — VIT D2 1.25 MG (50,000 UNIT: 1.25 MG | 28 days supply | Qty: 4 | Fill #0

## 2018-02-08 NOTE — Progress Notes (Signed)
Office: [phone]  /  Fax: [phone]   HPI:   Chief Complaint: OBESITY [name] is here to discuss her progress with her obesity treatment plan. She is on the Category 3 plan and is following her eating plan approximately 75 % of the time. She states she is exercising 0 minutes 0 times per week. [name] started Category 3 plan and her fat % decreased but she has increased H20 due to improving BGs and improved hydration.  Her weight is 216 lb (98 kg) today and has not lost weight since her last visit. She has lost 0 lbs since starting treatment with [place].  Diabetes II [name] has a diagnosis of diabetes type II. [name]'s last A1c was elevated at 10.0 and she states her fasting BGs ranged between 190 and 288 but this has improved with diet prescription. Her primary care physician increased her metformin and her neutral protamine hagedorn insulin as well. She denies any hypoglycemic episodes. She has been working on intensive lifestyle modifications including diet, exercise, and weight loss to help control her blood glucose levels.  At risk for cardiovascular disease [name] is at a higher than average risk for cardiovascular disease due to obesity and diabetes II. She currently denies any chest pain.  Vitamin D Deficiency [name] has a new diagnosis of vitamin D deficiency. She is not on Vit D, she notes fatigue and denies nausea, vomiting or muscle weakness.  ALLERGIES:  MEDICATIONS: Current Outpatient Medications on File Prior to Visit  Medication Sig Dispense Refill  . ALPRAZolam (XANAX) 0.5 MG tablet Take 0.5 mg by mouth 3 (three) times daily as needed for anxiety.    [name] aspirin EC 81 MG tablet Take 81 mg by mouth daily.    [name] atorvastatin (LIPITOR) 20 MG tablet TAKE 1 TABLET BY MOUTH DAILY. 90 tablet 1  . Blood Glucose Monitoring Suppl (FREESTYLE LITE) DEVI 1 Device by Does not apply route 2 (two) times daily. Please use [facility] if insurance covers for meter, lancets and strips 1 each  0  . butalbital-acetaminophen-caffeine (FIORICET, ESGIC) 50-325-40 MG tablet Take 1 tablet by mouth every 6 (six) hours as needed for headache. Do not refill in less than 30 day 12 tablet 5  . diazepam (VALIUM) 10 MG tablet Take 1 tablet (10 mg total) by mouth every 12 (twelve) hours as needed (muscle spasm). 30 tablet 3  . diltiazem (CARDIZEM LA) 240 MG 24 hr tablet TAKE 1 TABLET BY MOUTH DAILY 90 tablet 1  . EPINEPHrine 0.3 mg/0.3 mL IJ SOAJ injection Inject 0.3 mg into the muscle as needed (for allergic reaction).   3  . gabapentin (NEURONTIN) 100 MG capsule Take 6 capsules (600 mg total) by mouth 2 (two) times daily. 360 capsule 3  . Galcanezumab-gnlm (EMGALITY) 120 MG/ML SOAJ Inject 240 mg into the skin as directed AND 120 mg every 30 (thirty) days. Inj 240mg  once then 120mg  monthly. 1 pen 11  . glucose blood (FREESTYLE TEST STRIPS) test strip Use as instructed 100 each 0  . insulin NPH Human (HUMULIN N) 100 UNIT/ML injection Inject 0.05 mLs (5 Units total) into the skin 2 (two) times daily before a meal. (Patient taking differently: Inject 5 Units into the skin 2 (two) times daily before a meal. Inject sub q 18 units every morning and 5 units in the evening) 10 mL 11  . Insulin Syringes, Disposable, U-100 1 ML MISC Use with NPH insulin 60 each 11  . ketorolac (TORADOL) 10 MG tablet Take 1 tablet (  10 mg total) by mouth every 6 (six) hours as needed. 30 tablet 1  . Lancets (FREESTYLE) lancets Use as instructed 100 each 0  . losartan-hydrochlorothiazide (HYZAAR) 50-12.5 MG tablet TAKE 1 TABLET BY MOUTH DAILY 90 tablet 2  . meclizine (ANTIVERT) 25 MG tablet Take 25 mg by mouth 3 (three) times daily as needed for dizziness.    . metFORMIN (GLUCOPHAGE) 500 MG tablet Take 750 mg by mouth 2 (two) times daily with a meal.     . methocarbamol (ROBAXIN) 500 MG tablet Take 500 mg by mouth every 6 (six) hours as needed for muscle spasms.    . metoprolol succinate (TOPROL-XL) 25 MG 24 hr tablet Take 12.5 mg  by mouth daily.    . ondansetron (ZOFRAN ODT) 4 MG disintegrating tablet Take 1 tablet (4 mg total) by mouth every 8 (eight) hours as needed for nausea or vomiting. 20 tablet 0  . oxyCODONE-acetaminophen (ROXICET) 5-325 MG tablet Take 1-2 tablets by mouth every 4 (four) hours as needed for severe pain. 40 tablet 0  . spironolactone (ALDACTONE) 25 MG tablet Take 1 tablet (25 mg total) by mouth daily. As needed for fluid retention 30 tablet 3   No current facility-administered medications on file prior to visit.     PAST MEDICAL HISTORY: Past Medical History:  Diagnosis Date  . Allergy   . Anemia   . Anxiety    claustrophobic  . Asthma   . Back pain   . Biceps tendonosis of right shoulder   . Diabetes mellitus 2011   did not start metforfin, losing weight  . Dyspnea   . Fatty liver   . Food allergy   . Headache disorder   . History of concussion   . HLD (hyperlipidemia)   . Hypertension   . IBS (irritable bowel syndrome)   . Infertility, female   . Joint pain   . Lactose intolerance   . Leg edema   . Migraines   . Other specified disorders of thyroid   . Palpitation   . Post-menopausal   . Seborrheic dermatitis   . Shoulder impingement syndrome, right   . Vitamin D deficiency     PAST SURGICAL HISTORY: Past Surgical History:  Procedure Laterality Date  . ABDOMINAL HYSTERECTOMY  2006   heavy menses, endometriosis, l oophrectomy  . BACK SURGERY    . BREAST BIOPSY Right 2018   benign  . BREAST EXCISIONAL BIOPSY Right 2003   benign  . BREAST EXCISIONAL BIOPSY Right 1999   benign  . BREAST SURGERY     right breast x 2 , benign  . HERNIA REPAIR  2003   left inguinal   . LEFT OOPHORECTOMY    . SHOULDER ARTHROSCOPY WITH SUBACROMIAL DECOMPRESSION AND OPEN ROTATOR C Right 07/14/2016   Procedure: RIGHT SHOULDER ARTHROSCOPY WITH SUBACROMIAL DECOMPRESSION, DISTAL CLAVICLE RESECTION AND MINI OPEN ROTATOR CUFF REPAIR, OPEN BICEP TENDODESIS;  Surgeon: Garald Balding, MD;   Location: [facility];  Service: Orthopedics;  Laterality: Right;  . SHOULDER CLOSED REDUCTION Right 09/08/2016   Procedure: RIGHT CLOSED MANIPULATION SHOULDER;  Surgeon: Garald Balding, MD;  Location: [facility];  Service: Orthopedics;  Laterality: Right;  . TUBAL LIGATION      SOCIAL HISTORY: Social History   Tobacco Use  . Smoking status: Never Smoker  . Smokeless tobacco: Never Used  Substance Use Topics  . Alcohol use: No  . Drug use: No    FAMILY HISTORY: Family History  Problem Relation Age of Onset  . Diabetes Mother   . Heart disease Mother 86  . Hypertension Mother   . Hyperlipidemia Mother   . Heart failure Mother   . Stroke Mother   . Thyroid disease Mother   . Depression Mother   . Sleep apnea Mother   . Obesity Mother   . Cancer Father 48       Lung Cancer  . Heart disease Father   . Mental illness Sister        bipolar, substance abuse,  clean 2 yrs  . Hypertension Sister   . Cancer Paternal Grandmother 63       breast cancer  . Breast cancer Paternal Grandmother   . Diabetes Maternal Grandmother   . Hypertension Maternal Grandmother   . Diabetes Son     ROS: Review of Systems  Constitutional: Negative for weight loss.  Cardiovascular: Negative for chest pain.  Gastrointestinal: Negative for nausea and vomiting.  Musculoskeletal:       Negative muscle weakness  Endo/Heme/Allergies:       Negative hypoglycemia    PHYSICAL EXAM: Blood pressure 103/71, pulse 66, temperature 98.2 F (36.8 C), temperature source Oral, height 5\' 6"  (1.676 m), weight 216 lb (98 kg), SpO2 98 %. Body mass index is 34.86 kg/m. Physical Exam  Constitutional: She is oriented to person, place, and time. She appears well-developed and well-nourished.  Cardiovascular: Normal rate.  Pulmonary/Chest: Effort normal.  Musculoskeletal: Normal range of motion.  Neurological: She is oriented to person, place, and time.  Skin: Skin is warm and  dry.  Psychiatric: She has a normal mood and affect. Her behavior is normal.  Vitals reviewed.   RECENT LABS AND TESTS: BMET    Component Value Date/Time   NA 139 01/23/2018 0959   NA 141 06/14/2012 1524   K 4.1 01/23/2018 0959   K 3.6 06/14/2012 1524   CL 100 01/23/2018 0959   CL 110 (H) 06/14/2012 1524   CO2 21 01/23/2018 0959   CO2 27 06/14/2012 1524   GLUCOSE 231 (H) 01/23/2018 0959   GLUCOSE 161 (H) 06/20/2017 2002   GLUCOSE 88 06/14/2012 1524   BUN 13 01/23/2018 0959   BUN 17 06/14/2012 1524   CREATININE 0.82 01/23/2018 0959   CREATININE 0.83 06/14/2013 1608   CALCIUM 9.4 01/23/2018 0959   CALCIUM 9.0 06/14/2012 1524   GFRNONAA 85 01/23/2018 0959   GFRNONAA >60 06/14/2012 1524   GFRAA 98 01/23/2018 0959   GFRAA >60 06/14/2012 1524   Lab Results  Component Value Date   HGBA1C 10.0 (H) 01/23/2018   HGBA1C 9.2 (H) 11/20/2017   HGBA1C 7.7 (H) 07/05/2017   HGBA1C 8.3 (H) 03/22/2017   HGBA1C 7.0 (H) 11/18/2016   Lab Results  Component Value Date   INSULIN 26.5 (H) 01/23/2018   CBC    Component Value Date/Time   WBC 7.2 01/23/2018 0959   WBC 7.7 11/24/2017 1612   RBC 4.55 01/23/2018 0959   RBC 4.46 11/24/2017 1612   HGB 13.1 01/23/2018 0959   HCT 40.9 01/23/2018 0959   PLT 185 11/24/2017 1612   PLT 172 06/14/2012 1524   MCV 90 01/23/2018 0959   MCV 90 06/14/2012 1524   MCH 28.8 01/23/2018 0959   MCH 29.8 11/24/2017 1612   MCHC 32.0 01/23/2018 0959   MCHC 34.6 11/24/2017 1612   RDW 13.2 01/23/2018 0959   RDW 13.3 06/14/2012 1524   LYMPHSABS 2.8 01/23/2018 0959   MONOABS 0.7 06/14/2013 1608  EOSABS 0.2 01/23/2018 0959   BASOSABS 0.0 01/23/2018 0959   Iron/TIBC/Ferritin/ %Sat No results found for: IRON, TIBC, FERRITIN, IRONPCTSAT Lipid Panel     Component Value Date/Time   CHOL 130 01/23/2018 0959   TRIG 87 01/23/2018 0959   HDL 35 (L) 01/23/2018 0959   CHOLHDL 4.0 07/27/2017 0000   CHOLHDL 4 03/22/2017 1026   VLDL 22.2 03/22/2017 1026    LDLCALC 78 01/23/2018 0959   LDLDIRECT 98.0 08/17/2016 1555   Hepatic Function Panel     Component Value Date/Time   PROT 6.7 01/23/2018 0959   PROT 7.8 06/14/2012 1524   ALBUMIN 4.5 01/23/2018 0959   ALBUMIN 4.0 06/14/2012 1524   AST 18 01/23/2018 0959   AST 17 06/14/2012 1524   ALT 25 01/23/2018 0959   ALT 19 06/14/2012 1524   ALKPHOS 159 (H) 01/23/2018 0959   ALKPHOS 101 06/14/2012 1524   BILITOT 0.4 01/23/2018 0959   BILITOT 0.3 06/14/2012 1524   BILIDIR 0.1 11/20/2017 0808      Component Value Date/Time   TSH 0.935 01/23/2018 0959   TSH 0.35 (L) 11/24/2017 1612   TSH 1.06 11/07/2016 1831  Results for CHANTE, MAYSON (MRN [id number]) as of 02/08/2018 11:31  Ref. Range 01/23/2018 09:59  Vitamin D, 25-Hydroxy Latest Ref Range: 30.0 - 100.0 ng/mL 20.9 (L)    ASSESSMENT AND PLAN: Type 2 diabetes mellitus without complication, with long-term current use of insulin (HCC)  Vitamin D deficiency - Plan: Vitamin D, Ergocalciferol, (DRISDOL) 50000 units CAPS capsule  At risk for heart disease  Class 2 severe obesity with serious comorbidity and body mass index (BMI) of 35.0 to 35.9 in adult, unspecified obesity type ([place])  PLAN:  Diabetes II [name] has been given extensive diabetes education by myself today including ideal fasting and post-prandial blood glucose readings, individual ideal Hgb A1c goals and hypoglycemia prevention. We discussed the importance of good blood sugar control to decrease the likelihood of diabetic complications such as nephropathy, neuropathy, limb loss, blindness, coronary artery disease, and death. We discussed the importance of intensive lifestyle modification including diet, exercise and weight loss as the first line treatment for diabetes. [name] agrees to continue her diabetes medications, diet, and exercise, and will continue to monitor closely. [name] agrees to follow up with our clinic in 2 to 3 weeks.  Cardiovascular risk counselling [name] was  given extended (30 minutes) coronary artery disease prevention counseling today. She is 47 y.o. female and has risk factors for heart disease including obesity and diabetes II. We discussed intensive lifestyle modifications today with an emphasis on specific weight loss instructions and strategies. Pt was also informed of the importance of increasing exercise and decreasing saturated fats to help prevent heart disease.  Vitamin D Deficiency [name] was informed that low vitamin D levels contributes to fatigue and are associated with obesity, breast, and colon cancer. [name] agrees to start prescription Vit D @50 ,000 IU every week #4 with no refills. She will follow up for routine testing of vitamin D, at least 2-3 times per year. She was informed of the risk of over-replacement of vitamin D and agrees to not increase her dose unless she discusses this with [place] first. [name] agrees to follow up with our clinic in 2 to 3 weeks.  Obesity [name] is currently in the action stage of change. As such, her goal is to continue with weight loss efforts She has agreed to follow the Category 3 plan [name] has been instructed to work up  to a goal of 150 minutes of combined cardio and strengthening exercise per week for weight loss and overall health benefits. We discussed the following Behavioral Modification Strategies today: increasing lean protein intake, decreasing simple carbohydrates  and work on meal planning and easy cooking plans   [name] has agreed to follow up with our clinic in 2 to 3 weeks. She was informed of the importance of frequent follow up visits to maximize her success with intensive lifestyle modifications for her multiple health conditions.   OBESITY BEHAVIORAL INTERVENTION VISIT  Today's visit was # 2   Starting weight: 216 lbs Starting date: 01/23/18 Today's weight : 216 lbs  Today's date: 02/07/2018 Total lbs lost to date: 0 At least 15 minutes were spent on discussing the following  behavioral intervention visit.   ASK: We discussed the diagnosis of obesity with [name] today and [name] agreed to give [place] permission to discuss obesity behavioral modification therapy today.  ASSESS: [name] has the diagnosis of obesity and her BMI today is 34.88 [name] is in the action stage of change   ADVISE: [name] was educated on the multiple health risks of obesity as well as the benefit of weight loss to improve her health. She was advised of the need for long term treatment and the importance of lifestyle modifications to improve her current health and to decrease her risk of future health problems.  AGREE: Multiple dietary modification options and treatment options were discussed and  [name] agreed to follow the recommendations documented in the above note.  ARRANGE: [name] was educated on the importance of frequent visits to treat obesity as outlined per CMS and USPSTF guidelines and agreed to schedule her next follow up appointment today.  I, Trixie Dredge, am acting as transcriptionist for Dennard Nip, MD  I have reviewed the above documentation for accuracy and completeness, and I agree with the above. -Dennard Nip, MD

## 2018-02-09 ENCOUNTER — Other Ambulatory Visit: Payer: Self-pay | Admitting: Internal Medicine

## 2018-02-09 MED ORDER — INSULIN NPH (HUMAN) (ISOPHANE) 100 UNIT/ML ~~LOC~~ SUSP: SUBCUTANEOUS | 11 refills | Status: DC

## 2018-02-14 ENCOUNTER — Ambulatory Visit: Payer: Self-pay | Admitting: *Deleted

## 2018-02-14 NOTE — Telephone Encounter (Signed)
She has not read my last mychart message:  She should continue the NPH  Twice daily as directed in my e mail.  20 units in the evening and 10 in the morning    Why?  Because:  Goal for fasting sugar is < 120.  (she reports she is at 150)  The NPH provides 12 hour coverage and is a gradual peak.  The morning dose covers the afternoon sugars .  The mealtime insulin peaks in an hour (to cover the meals) and then fades away.   She

## 2018-02-14 NOTE — Telephone Encounter (Signed)
Pt called and wonders if she should take her NPH because her CBG was 150; if her CBG is 150 or less, should she take the morning dose of humulin; her fasting blood sugar this morning was 150;  also since her blood sugars are "phenomenal" does she still need to see endocrinology?; she says her fasting blood sugars have been 150-170s; 2 hours after lunch it was 120 on 02/13/09 and 150 on 02/11/18; the pt can be contacted at [phone]; will route to office for provider review; she would like to be called back.   Reason for Disposition . Caller has URGENT medication question about med that PCP prescribed and triager unable to answer question  Answer Assessment - Initial Assessment Questions 1. SYMPTOMS: "Do you have any symptoms?"     Fasting blood sugars 150-170s 2. SEVERITY: If symptoms are present, ask "Are they mild, moderate or severe?"     Feels bad when blood sugar dropped to 120  Protocols used: MEDICATION QUESTION CALL-A-AH

## 2018-02-14 NOTE — Telephone Encounter (Signed)
Spoke with pt and read her the mychart that Dr. Derrel Nip had sent her on 02/09/2018. Pt stated that she never got this message so she was unaware that she was supposed to increase her insulin dose to 20 units in the evening and 10units in the morning. The pt is concerned that her sugars are going to drop really low and she is going to feel horrible. I explained to pt per Dr. Derrel Nip that she is going to probably feel bad because she has been out of control for so long. The pt stated that she understood that. Pt also wanted to know if she woke up in the morning and her fasting sugar was 120 or less if she needed to hold the 10  Units in the morning, explained to pt that she should not hold the 10 units in the morning because that is going to cause her sugars to go back up because that dose covers her afternoon sugars. Also got a verbal from Dr. Derrel Nip stating the same thing because the pt didn't seem to be understanding. Dr. Derrel Nip suggested that the pt come in to see our clinical pharmacist. Pt agreed and has been scheduled with Catie on 02/26/2018. Pt is aware of appt date and time.

## 2018-02-14 NOTE — Telephone Encounter (Signed)
Message Part 2:  She feels bad when her sugar is normal because she has been out of control for so long .  She should not have to take more than 3 or 4 units of mealtime insulin if she takes the NPH as directed.

## 2018-02-14 NOTE — Telephone Encounter (Signed)
Request about medication and blood sugars.

## 2018-02-19 ENCOUNTER — Ambulatory Visit
Admission: RE | Admit: 2018-02-19 | Discharge: 2018-02-19 | Disposition: A | Discharge status: Home / Self Care | Payer: 59 | Source: Ambulatory Visit | Attending: Internal Medicine | Admitting: Internal Medicine

## 2018-02-19 DIAGNOSIS — Z1231 Encounter for screening mammogram for malignant neoplasm of breast: Secondary | ICD-10-CM

## 2018-02-20 MED FILL — EMGALITY 120 MG/ML SOAJ: 120 | 30 days supply | Qty: 1 | Fill #8

## 2018-02-21 ENCOUNTER — Ambulatory Visit (INDEPENDENT_AMBULATORY_CARE_PROVIDER_SITE_OTHER): Payer: 59 | Admitting: Bariatrics

## 2018-02-21 ENCOUNTER — Encounter (INDEPENDENT_AMBULATORY_CARE_PROVIDER_SITE_OTHER): Payer: Self-pay | Admitting: Bariatrics

## 2018-02-21 VITALS — BP 95/67 | HR 82 | Temp 98.5°F | Ht 66.0 in | Wt 214.0 lb

## 2018-02-21 DIAGNOSIS — E559 Vitamin D deficiency, unspecified: Secondary | ICD-10-CM

## 2018-02-21 DIAGNOSIS — Z794 Long term (current) use of insulin: Secondary | ICD-10-CM | POA: Diagnosis not present

## 2018-02-21 DIAGNOSIS — Z6834 Body mass index (BMI) 34.0-34.9, adult: Secondary | ICD-10-CM

## 2018-02-21 DIAGNOSIS — E119 Type 2 diabetes mellitus without complications: Secondary | ICD-10-CM

## 2018-02-21 DIAGNOSIS — Z9189 Other specified personal risk factors, not elsewhere classified: Secondary | ICD-10-CM

## 2018-02-21 DIAGNOSIS — E669 Obesity, unspecified: Secondary | ICD-10-CM | POA: Diagnosis not present

## 2018-02-21 MED ORDER — VITAMIN D (ERGOCALCIFEROL) 1.25 MG (50000 UNIT) PO CAPS: 50000.0000 [IU] | ORAL_CAPSULE | ORAL | 0 refills | Status: DC

## 2018-02-26 ENCOUNTER — Ambulatory Visit (INDEPENDENT_AMBULATORY_CARE_PROVIDER_SITE_OTHER): Payer: 59 | Admitting: Pharmacist

## 2018-02-26 DIAGNOSIS — E1165 Type 2 diabetes mellitus with hyperglycemia: Secondary | ICD-10-CM | POA: Diagnosis not present

## 2018-02-26 DIAGNOSIS — E119 Type 2 diabetes mellitus without complications: Secondary | ICD-10-CM

## 2018-02-26 DIAGNOSIS — Z794 Long term (current) use of insulin: Secondary | ICD-10-CM

## 2018-02-26 MED ORDER — METFORMIN HCL ER 500 MG PO TB24: 1000.0000 mg | ORAL_TABLET | Freq: Two times a day (BID) | ORAL | 2 refills | Status: DC

## 2018-02-26 MED ORDER — INSULIN DEGLUDEC 100 UNIT/ML ~~LOC~~ SOLN: 25.0000 [IU] | Freq: Every day | SUBCUTANEOUS | 1 refills | Status: DC

## 2018-02-26 MED ORDER — PEN NEEDLES 32G X 5 MM MISC: 1.0000 "pen " | Freq: Every day | 3 refills | Status: DC

## 2018-02-26 MED FILL — UNIFINE PENTIPS 32GX5/32": 32G X 4 MM | 90 days supply | Qty: 100 | Fill #0

## 2018-02-26 MED FILL — UNIFINE PENTIPS 32GX5/32: 32G X 4 MM | 90 days supply | Qty: 100 | Fill #0

## 2018-02-26 MED FILL — METFORMIN HCL ER 500 MG TAB: 500 | 30 days supply | Qty: 120 | Fill #0

## 2018-02-26 MED FILL — TRESIBA FLEXTOUCH 100 UNITS: 100 | 38 days supply | Qty: 15 | Fill #0

## 2018-02-26 NOTE — Assessment & Plan Note (Signed)
#  Diabetes - Currently uncontrolled, most recent A1c 10.0% on 01/23/2018, worsened from 9.2% in 10/2017.; Goal A1c <7% Patient reports hypoglycemic events. Patient reports adherence with medication. Appropriate to consider a true basal insulin for more consistent medication release, minimized hypoglycemia risk. Appropriate to attempt to maximize metformin with extended release formulation - Increase metformin to 1000 mg BID using extended release formulation - Discontinue Humulin; start Tresiba 25 units daily. Increase by 1 unit every 4 days if BG consistently greater than 130.

## 2018-02-26 NOTE — Progress Notes (Addendum)
S:     Chief Complaint  Patient presents with  . Medication Management    Diabetes    Patient arrives in good spirits, ambulating without assisatnce.  Presents for diabetes evaluation, education, and management at the request of Dr. Derrel Nip (referred in nurse triage phone call on 02/14/2018). Last seen by primary care provider on 11/24/2017; at that time, it was noted that patient discontinued Trulicity after a sore throat/trouble swallowing, and was found to have a multinodular thyroid. She does not want to restart GLP1s after that, and reading the [facility] Box warning for medullary thyroid cancer. Since then, she has been managed on insulin NPH. She is also following with weight management.   She notes that she is currently taking metformin at 750 mg BID as she previously had some stomach upset with 1000 mg BID   Patient reports diabetes was diagnosed first with gestational DM 15 years ago; She is unsure when T2DM was diagnosed  Insurance coverage/medication affordability: [employer]   Patient reports adherence with medications.  Current diabetes medications include: Humulin 5-10 units QAM, 20 units QPM,  Current hypertension medications include: losartan-HCTZ 50-12.5 mg daily  Patient denies hypoglycemic events, but has felt s/sx including, shakiness, with SMBG <80. She also reports an episode in which she took Humulin and her blood sugar dropped from 155 to 90 in a few hours. Patient reports hyperglycemic symptoms including polyuria, polyphagia, nocturia.    O:  Physical Exam  Constitutional: She appears well-developed and well-nourished.  Vitals reviewed.   Review of Systems  All other systems reviewed and are negative.  Lab Results  Component Value Date   HGBA1C 10.0 (H) 01/23/2018   There were no vitals filed for this visit.  Basic Metabolic Panel BMP Latest Ref Rng & Units 01/23/2018 07/27/2017 07/05/2017  Glucose 65 - 99 mg/dL 231(H) 165(H) CANCELED  BUN 6 -  24 mg/dL 13 16 CANCELED  Creatinine 0.57 - 1.00 mg/dL 0.82 0.87 CANCELED  BUN/Creat Ratio 9 - 23 16 18  -  Sodium 134 - 144 mmol/L 139 141 CANCELED  Potassium 3.5 - 5.2 mmol/L 4.1 3.9 CANCELED  Chloride 96 - 106 mmol/L 100 102 CANCELED  CO2 20 - 29 mmol/L 21 23 CANCELED  Calcium 8.7 - 10.2 mg/dL 9.4 9.6 CANCELED     Lipid Panel     Component Value Date/Time   CHOL 130 01/23/2018 0959   TRIG 87 01/23/2018 0959   HDL 35 (L) 01/23/2018 0959   CHOLHDL 4.0 07/27/2017 0000   CHOLHDL 4 03/22/2017 1026   VLDL 22.2 03/22/2017 1026   LDLCALC 78 01/23/2018 0959   LDLDIRECT 98.0 08/17/2016 1555    Fasting SMBG: Generally <130, though occasionally >150s 2 hour post-prandial/random SMBG: not checking  Clinical ASCVD: No  ASCVD risk factors: age 15-75  A/P: Following discussion and approval by Dr. Derrel Nip, the following medication changes were made:   #Diabetes - Currently uncontrolled, most recent A1c 10.0% on 01/23/2018, worsened from 9.2% in 10/2017.; Goal A1c <7% Patient reports hypoglycemic events. Patient reports adherence with medication. Appropriate to consider a true basal insulin for more consistent medication release, minimized hypoglycemia risk. Appropriate to attempt to maximize metformin with extended release formulation - Increase metformin to 1000 mg BID using extended release formulation - Discontinue Humulin; start Tresiba 25 units daily. Increase by 1 unit every 4 days if BG consistently greater than 130.     Written patient instructions provided.  Total time in face to face counseling 45  minutes.    Follow up in Pharmacist Clinic Visit 3-4 weeks.   De Hollingshead, PharmD PGY2 Ambulatory Care Pharmacy Resident Phone: [phone]   I have reviewed the above information and agree with above.   Deborra Medina, MD

## 2018-02-26 NOTE — Patient Instructions (Signed)
It was great to meet you today!   I have sent in a prescription for the metformin XR (extended release); try taking 2 with supper, 1 with breakfast for 1 week, then increase to 2 twice daily for the maximum 2000 mg daily.   Discontinue Humulin and start Tresiba (insulin degludec) 25 units daily. Increase by 1 unit every 4 days if fasting blood sugars are consistently greater than 130.   You can go to the [place] website and apply for a coupon card to take to the pharmacy to help reduce the copay.    Please schedule follow up with me in 3-4 weeks. Feel free to call with any questions!   Catie Darnelle Maffucci, PharmD

## 2018-02-26 NOTE — Progress Notes (Signed)
Office: [phone]  /  Fax: [phone]   HPI:   Chief Complaint: OBESITY [name] is here to discuss her progress with her obesity treatment plan. She is on the Category 3 plan and is following her eating plan approximately 90 % of the time. She states she is exercising 0 minutes 0 times per week. [name] states she is tired of the plan and does not like the bread. She finds it difficult at night, "too much meat". She substitutes bread for other bread. She denies polyphagia.  Her weight is 214 lb (97.1 kg) today and has had a weight loss of 2 pounds over a period of 2 weeks since her last visit. She has lost 2 lbs since starting treatment with [place].  Vitamin D Deficiency [name] has a diagnosis of vitamin D deficiency. She is taking high dose prescription Vit D and denies nausea, vomiting or muscle weakness.  At risk for osteopenia and osteoporosis [name] is at higher risk of osteopenia and osteoporosis due to vitamin D deficiency.   Diabetes II [name] has a diagnosis of diabetes type II. Last A1c was 10.0 on 01/23/18. She is taking Humulin 20 units qhs and 5 units in the morning, down from 10 units, and she is taking metformin 750 mg BID. [name] states fasting BGs range between 116 and 151, occasional low BGs for her ranges in the 90's. She denies any hypoglycemic episodes. She has been working on intensive lifestyle modifications including diet, exercise, and weight loss to help control her blood glucose levels.  ALLERGIES:   MEDICATIONS: Current Outpatient Medications on File Prior to Visit  Medication Sig Dispense Refill  . ALPRAZolam (XANAX) 0.5 MG tablet Take 0.5 mg by mouth 3 (three) times daily as needed for anxiety.    [name] aspirin EC 81 MG tablet Take 81 mg by mouth daily.    [name] atorvastatin (LIPITOR) 20 MG tablet TAKE 1 TABLET BY MOUTH DAILY. 90 tablet 1  . Blood Glucose Monitoring Suppl (FREESTYLE LITE) DEVI 1 Device by Does not apply route 2 (two) times daily. Please use  [facility] if insurance covers for meter, lancets and strips 1 each 0  . butalbital-acetaminophen-caffeine (FIORICET, ESGIC) 50-325-40 MG tablet Take 1 tablet by mouth every 6 (six) hours as needed for headache. Do not refill in less than 30 day 12 tablet 5  . diazepam (VALIUM) 10 MG tablet Take 1 tablet (10 mg total) by mouth every 12 (twelve) hours as needed (muscle spasm). 30 tablet 3  . diltiazem (CARDIZEM LA) 240 MG 24 hr tablet TAKE 1 TABLET BY MOUTH DAILY 90 tablet 1  . EPINEPHrine 0.3 mg/0.3 mL IJ SOAJ injection Inject 0.3 mg into the muscle as needed (for allergic reaction).   3  . Galcanezumab-gnlm (EMGALITY) 120 MG/ML SOAJ Inject 240 mg into the skin as directed AND 120 mg every 30 (thirty) days. Inj 240mg  once then 120mg  monthly. 1 pen 11  . glucose blood (FREESTYLE TEST STRIPS) test strip Use as instructed 100 each 0  . Insulin Syringes, Disposable, U-100 1 ML MISC Use with NPH insulin 60 each 11  . ketorolac (TORADOL) 10 MG tablet Take 1 tablet (10 mg total) by mouth every 6 (six) hours as needed. 30 tablet 1  . Lancets (FREESTYLE) lancets Use as instructed 100 each 0  . losartan-hydrochlorothiazide (HYZAAR) 50-12.5 MG tablet TAKE 1 TABLET BY MOUTH DAILY 90 tablet 2  . meclizine (ANTIVERT) 25 MG tablet Take 25 mg by mouth 3 (three) times daily as needed  for dizziness.    . metoprolol succinate (TOPROL-XL) 25 MG 24 hr tablet Take 12.5 mg by mouth daily.    . ondansetron (ZOFRAN ODT) 4 MG disintegrating tablet Take 1 tablet (4 mg total) by mouth every 8 (eight) hours as needed for nausea or vomiting. 20 tablet 0  . oxyCODONE-acetaminophen (ROXICET) 5-325 MG tablet Take 1-2 tablets by mouth every 4 (four) hours as needed for severe pain. (Patient not taking: Reported on 02/26/2018) 40 tablet 0  . spironolactone (ALDACTONE) 25 MG tablet Take 1 tablet (25 mg total) by mouth daily. As needed for fluid retention (Patient not taking: Reported on 02/26/2018) 30 tablet 3   No current  facility-administered medications on file prior to visit.     PAST MEDICAL HISTORY: Past Medical History:  Diagnosis Date  . Allergy   . Anemia   . Anxiety    claustrophobic  . Asthma   . Back pain   . Biceps tendonosis of right shoulder   . Diabetes mellitus 2011   did not start metforfin, losing weight  . Dyspnea   . Fatty liver   . Food allergy   . Headache disorder   . History of concussion   . HLD (hyperlipidemia)   . Hypertension   . IBS (irritable bowel syndrome)   . Infertility, female   . Joint pain   . Lactose intolerance   . Leg edema   . Migraines   . Other specified disorders of thyroid   . Palpitation   . Post-menopausal   . Seborrheic dermatitis   . Shoulder impingement syndrome, right   . Vitamin D deficiency     PAST SURGICAL HISTORY: Past Surgical History:  Procedure Laterality Date  . ABDOMINAL HYSTERECTOMY  2006   heavy menses, endometriosis, l oophrectomy  . BACK SURGERY    . BREAST BIOPSY Right 2018   benign  . BREAST EXCISIONAL BIOPSY Right 2003   benign  . BREAST EXCISIONAL BIOPSY Right 1999   benign  . BREAST SURGERY     right breast x 2 , benign  . HERNIA REPAIR  2003   left inguinal   . LEFT OOPHORECTOMY    . SHOULDER ARTHROSCOPY WITH SUBACROMIAL DECOMPRESSION AND OPEN ROTATOR C Right 07/14/2016   Procedure: RIGHT SHOULDER ARTHROSCOPY WITH SUBACROMIAL DECOMPRESSION, DISTAL CLAVICLE RESECTION AND MINI OPEN ROTATOR CUFF REPAIR, OPEN BICEP TENDODESIS;  Surgeon: Garald Balding, MD;  Location: [facility];  Service: Orthopedics;  Laterality: Right;  . SHOULDER CLOSED REDUCTION Right 09/08/2016   Procedure: RIGHT CLOSED MANIPULATION SHOULDER;  Surgeon: Garald Balding, MD;  Location: [facility];  Service: Orthopedics;  Laterality: Right;  . TUBAL LIGATION      SOCIAL HISTORY: Social History   Tobacco Use  . Smoking status: Never Smoker  . Smokeless tobacco: Never Used  Substance Use Topics  . Alcohol  use: No  . Drug use: No    FAMILY HISTORY: Family History  Problem Relation Age of Onset  . Diabetes Mother   . Heart disease Mother 58  . Hypertension Mother   . Hyperlipidemia Mother   . Heart failure Mother   . Stroke Mother   . Thyroid disease Mother   . Depression Mother   . Sleep apnea Mother   . Obesity Mother   . Cancer Father 32       Lung Cancer  . Heart disease Father   . Mental illness Sister        bipolar,  substance abuse,  clean 2 yrs  . Hypertension Sister   . Cancer Paternal Grandmother 36       breast cancer  . Breast cancer Paternal Grandmother   . Diabetes Maternal Grandmother   . Hypertension Maternal Grandmother   . Diabetes Son     ROS: Review of Systems  Constitutional: Positive for weight loss.  Gastrointestinal: Negative for nausea and vomiting.  Musculoskeletal:       Negative muscle weakness  Endo/Heme/Allergies:       Negative hypoglycemia Negative polyphagia    PHYSICAL EXAM: Blood pressure 95/67, pulse 82, temperature 98.5 F (36.9 C), temperature source Oral, height 5\' 6"  (1.676 m), weight 214 lb (97.1 kg), SpO2 97 %. Body mass index is 34.54 kg/m. Physical Exam  Constitutional: She is oriented to person, place, and time. She appears well-developed and well-nourished.  Cardiovascular: Normal rate.  Pulmonary/Chest: Effort normal.  Musculoskeletal: Normal range of motion.  Neurological: She is oriented to person, place, and time.  Skin: Skin is warm and dry.  Psychiatric: She has a normal mood and affect. Her behavior is normal.  Vitals reviewed.   RECENT LABS AND TESTS: BMET    Component Value Date/Time   NA 139 01/23/2018 0959   NA 141 06/14/2012 1524   K 4.1 01/23/2018 0959   K 3.6 06/14/2012 1524   CL 100 01/23/2018 0959   CL 110 (H) 06/14/2012 1524   CO2 21 01/23/2018 0959   CO2 27 06/14/2012 1524   GLUCOSE 231 (H) 01/23/2018 0959   GLUCOSE 161 (H) 06/20/2017 2002   GLUCOSE 88 06/14/2012 1524   BUN 13  01/23/2018 0959   BUN 17 06/14/2012 1524   CREATININE 0.82 01/23/2018 0959   CREATININE 0.83 06/14/2013 1608   CALCIUM 9.4 01/23/2018 0959   CALCIUM 9.0 06/14/2012 1524   GFRNONAA 85 01/23/2018 0959   GFRNONAA >60 06/14/2012 1524   GFRAA 98 01/23/2018 0959   GFRAA >60 06/14/2012 1524   Lab Results  Component Value Date   HGBA1C 10.0 (H) 01/23/2018   HGBA1C 9.2 (H) 11/20/2017   HGBA1C 7.7 (H) 07/05/2017   HGBA1C 8.3 (H) 03/22/2017   HGBA1C 7.0 (H) 11/18/2016   Lab Results  Component Value Date   INSULIN 26.5 (H) 01/23/2018   CBC    Component Value Date/Time   WBC 7.2 01/23/2018 0959   WBC 7.7 11/24/2017 1612   RBC 4.55 01/23/2018 0959   RBC 4.46 11/24/2017 1612   HGB 13.1 01/23/2018 0959   HCT 40.9 01/23/2018 0959   PLT 185 11/24/2017 1612   PLT 172 06/14/2012 1524   MCV 90 01/23/2018 0959   MCV 90 06/14/2012 1524   MCH 28.8 01/23/2018 0959   MCH 29.8 11/24/2017 1612   MCHC 32.0 01/23/2018 0959   MCHC 34.6 11/24/2017 1612   RDW 13.2 01/23/2018 0959   RDW 13.3 06/14/2012 1524   LYMPHSABS 2.8 01/23/2018 0959   MONOABS 0.7 06/14/2013 1608   EOSABS 0.2 01/23/2018 0959   BASOSABS 0.0 01/23/2018 0959   Iron/TIBC/Ferritin/ %Sat No results found for: IRON, TIBC, FERRITIN, IRONPCTSAT Lipid Panel     Component Value Date/Time   CHOL 130 01/23/2018 0959   TRIG 87 01/23/2018 0959   HDL 35 (L) 01/23/2018 0959   CHOLHDL 4.0 07/27/2017 0000   CHOLHDL 4 03/22/2017 1026   VLDL 22.2 03/22/2017 1026   LDLCALC 78 01/23/2018 0959   LDLDIRECT 98.0 08/17/2016 1555   Hepatic Function Panel     Component Value Date/Time  PROT 6.7 01/23/2018 0959   PROT 7.8 06/14/2012 1524   ALBUMIN 4.5 01/23/2018 0959   ALBUMIN 4.0 06/14/2012 1524   AST 18 01/23/2018 0959   AST 17 06/14/2012 1524   ALT 25 01/23/2018 0959   ALT 19 06/14/2012 1524   ALKPHOS 159 (H) 01/23/2018 0959   ALKPHOS 101 06/14/2012 1524   BILITOT 0.4 01/23/2018 0959   BILITOT 0.3 06/14/2012 1524   BILIDIR 0.1  11/20/2017 0808      Component Value Date/Time   TSH 0.935 01/23/2018 0959   TSH 0.35 (L) 11/24/2017 1612   TSH 1.06 11/07/2016 1831  Results for LIVANA, YERIAN (MRN [id number]) as of 02/26/2018 17:26  Ref. Range 01/23/2018 09:59  Vitamin D, 25-Hydroxy Latest Ref Range: 30.0 - 100.0 ng/mL 20.9 (L)    ASSESSMENT AND PLAN: Vitamin D deficiency - Plan: Vitamin D, Ergocalciferol, (DRISDOL) 50000 units CAPS capsule  Type 2 diabetes mellitus without complication, with long-term current use of insulin (HCC)  At risk for osteoporosis  Class 1 obesity with serious comorbidity and body mass index (BMI) of 34.0 to 34.9 in adult, unspecified obesity type  PLAN:  Vitamin D Deficiency Adrianna was informed that low vitamin D levels contributes to fatigue and are associated with obesity, breast, and colon cancer. Doni agrees to continue taking prescription Vit D @50 ,000 IU every week #4 and we will refill for 1 month. She will follow up for routine testing of vitamin D, at least 2-3 times per year. She was informed of the risk of over-replacement of vitamin D and agrees to not increase her dose unless she discusses this with [place] first. Mahli agrees to follow up with our clinic in 2 weeks.  At risk for osteopenia and osteoporosis Brynnley was given extended  (15 minutes) osteoporosis prevention counseling today. Yuma is at risk for osteopenia and osteoporsis due to her vitamin D deficiency. She was encouraged to take her vitamin D and follow her higher calcium diet and increase strengthening exercise to help strengthen her bones and decrease her risk of osteopenia and osteoporosis.  Diabetes II Lucilla has been given extensive diabetes education by myself today including ideal fasting and post-prandial blood glucose readings, individual ideal Hgb A1c goals and hypoglycemia prevention. We discussed the importance of good blood sugar control to decrease the likelihood of diabetic complications such as  nephropathy, neuropathy, limb loss, blindness, coronary artery disease, and death. We discussed the importance of intensive lifestyle modification including diet, exercise and weight loss as the first line treatment for diabetes. Katana agrees to continue her diabetes medications, she will continue to check fasting blood sugars and post prandials, and she is to bring in her BGs log. If her blood sugars go too low, she is to call her primary care physician or call us. Theola agrees to follow up with our clinic in 2 weeks.  Obesity [place] is currently in the action stage of change. As such, her goal is to continue with weight loss efforts She has agreed to follow the Category 3 plan, no bread Gladie has been instructed to work up to a goal of 150 minutes of combined cardio and strengthening exercise per week for weight loss and overall health benefits. We discussed the following Behavioral Modification Strategies today: increasing lean protein intake, decreasing simple carbohydrates, increasing vegetables, increase H20 intake, and no skipping meals Naarah is to stop eating the bread on Category 3 plan, substitute either 50 calorie wraps or increase protein (given a list for increasing protein).  Brinda has agreed to follow up with our clinic in 2 weeks. She was informed of the importance of frequent follow up visits to maximize her success with intensive lifestyle modifications for her multiple health conditions.   OBESITY BEHAVIORAL INTERVENTION VISIT  Today's visit was # 3   Starting weight: 216 lbs Starting date: 01/23/18 Today's weight : 214 lbs Today's date: 02/21/2018 Total lbs lost to date: 2    ASK: We discussed the diagnosis of obesity with [name] today and Ravleen agreed to give [place] permission to discuss obesity behavioral modification therapy today.  ASSESS: Tenesia has the diagnosis of obesity and her BMI today is 34.56 Adellyn is in the action stage of change    ADVISE: Shaney was educated on the multiple health risks of obesity as well as the benefit of weight loss to improve her health. She was advised of the need for long term treatment and the importance of lifestyle modifications to improve her current health and to decrease her risk of future health problems.  AGREE: Multiple dietary modification options and treatment options were discussed and  Dalicia agreed to follow the recommendations documented in the above note.  ARRANGE: Devlin was educated on the importance of frequent visits to treat obesity as outlined per CMS and USPSTF guidelines and agreed to schedule her next follow up appointment today.  Wilhemena Durie, am acting as transcriptionist for [employer], DO  I have reviewed the above documentation for accuracy and completeness, and I agree with the above. -Jearld Lesch, DO

## 2018-02-27 ENCOUNTER — Ambulatory Visit: Payer: Self-pay | Admitting: Endocrinology

## 2018-03-08 ENCOUNTER — Other Ambulatory Visit: Payer: Self-pay | Admitting: Internal Medicine

## 2018-03-08 MED FILL — VIT D2 1.25 MG (50,000 UNIT: 1.25 MG | 28 days supply | Qty: 4 | Fill #0

## 2018-03-08 NOTE — Telephone Encounter (Signed)
Please review for refill. Thanks, Sharon  

## 2018-03-12 ENCOUNTER — Encounter (INDEPENDENT_AMBULATORY_CARE_PROVIDER_SITE_OTHER): Payer: Self-pay

## 2018-03-12 ENCOUNTER — Ambulatory Visit (INDEPENDENT_AMBULATORY_CARE_PROVIDER_SITE_OTHER): Payer: Self-pay | Admitting: Family Medicine

## 2018-03-13 ENCOUNTER — Encounter (INDEPENDENT_AMBULATORY_CARE_PROVIDER_SITE_OTHER): Payer: Self-pay | Admitting: Physical Medicine and Rehabilitation

## 2018-03-13 ENCOUNTER — Ambulatory Visit (INDEPENDENT_AMBULATORY_CARE_PROVIDER_SITE_OTHER): Payer: PRIVATE HEALTH INSURANCE | Admitting: Physical Medicine and Rehabilitation

## 2018-03-13 DIAGNOSIS — R202 Paresthesia of skin: Secondary | ICD-10-CM

## 2018-03-13 NOTE — Progress Notes (Signed)
  Numeric Pain Rating Scale and Functional Assessment Average Pain 6   In the last MONTH (on 0-10 scale) has pain interfered with the following?  1. General activity like being  able to carry out your everyday physical activities such as walking, climbing stairs, carrying groceries, or moving a chair?  Rating(8)    

## 2018-03-21 MED FILL — METOPROLOL TARTRATE 25 MG T: 25 | 90 days supply | Qty: 45 | Fill #0

## 2018-03-21 NOTE — Procedures (Signed)
EMG & NCV Findings: Evaluation of the right median motor nerve showed reduced amplitude (2.1 mV).  All remaining nerves (as indicated in the following tables) were within normal limits.    All examined muscles (as indicated in the following table) showed no evidence of electrical instability.    Impression: Essentially NORMAL electrodiagnostic study of the right upper limb.  The reduced amplitude on the distal motor median nerve study is likely technical artifact as the amplitude was normal when stimulated at the elbow.  There is no significant electrodiagnostic evidence of nerve entrapment, brachial plexopathy or cervical radiculopathy.    **As you know, purely sensory or demyelinating radiculopathies and chemical radiculitis may not be detected with this particular electrodiagnostic study.  Recommendations: 1.  Follow-up with referring physician. 2.  Continue current management of symptoms.  ___________________________ Laurence Spates FAAPMR Board Certified, American Board of Physical Medicine and Rehabilitation    Nerve Conduction Studies Anti Sensory Summary Table   Stim Site NR Peak (ms) Norm Peak (ms) P-T Amp (V) Norm P-T Amp Site1 Site2 Delta-P (ms) Dist (cm) Vel (m/s) Norm Vel (m/s)  Right Median Acr Palm Anti Sensory (2nd Digit)  33.7C  Wrist    3.5 <3.6 29.4 >10 Wrist Palm 1.6 0.0    Palm    1.9 <2.0 20.7         Right Radial Anti Sensory (Base 1st Digit)  32.3C  Wrist    2.2 <3.1 23.9  Wrist Base 1st Digit 2.2 0.0    Right Ulnar Anti Sensory (5th Digit)  33.4C  Wrist    3.1 <3.7 29.9 >15.0 Wrist 5th Digit 3.1 14.0 45 >38   Motor Summary Table   Stim Site NR Onset (ms) Norm Onset (ms) O-P Amp (mV) Norm O-P Amp Site1 Site2 Delta-0 (ms) Dist (cm) Vel (m/s) Norm Vel (m/s)  Right Median Motor (Abd Poll Brev)  31.6C  Wrist    3.4 <4.2 *2.1 >5 Elbow Wrist 4.3 22.0 51 >50  Elbow    7.7  4.1         Right Ulnar Motor (Abd Dig Min)  31.3C  Wrist    2.7 <4.2 9.3 >3 B Elbow  Wrist 3.9 22.0 56 >53  B Elbow    6.6  8.8  A Elbow B Elbow 1.4 10.0 71 >53  A Elbow    8.0  8.6          EMG   Side Muscle Nerve Root Ins Act Fibs Psw Amp Dur Poly Recrt Int Fraser Din Comment  Right 1stDorInt Ulnar C8-T1 Nml Nml Nml Nml Nml 0 Nml Nml   Right Abd Poll Brev Median C8-T1 Nml Nml Nml Nml Nml 0 Nml Nml   Right ExtDigCom   Nml Nml Nml Nml Nml 0 Nml Nml   Right Triceps Radial C6-7-8 Nml Nml Nml Nml Nml 0 Nml Nml   Right Deltoid Axillary C5-6 Nml Nml Nml Nml Nml 0 Nml Nml     Nerve Conduction Studies Anti Sensory Left/Right Comparison   Stim Site L Lat (ms) R Lat (ms) L-R Lat (ms) L Amp (V) R Amp (V) L-R Amp (%) Site1 Site2 L Vel (m/s) R Vel (m/s) L-R Vel (m/s)  Median Acr Palm Anti Sensory (2nd Digit)  33.7C  Wrist  3.5   29.4  Wrist Palm     Palm  1.9   20.7        Radial Anti Sensory (Base 1st Digit)  32.3C  Wrist  2.2  23.9  Wrist Base 1st Digit     Ulnar Anti Sensory (5th Digit)  33.4C  Wrist  3.1   29.9  Wrist 5th Digit  45    Motor Left/Right Comparison   Stim Site L Lat (ms) R Lat (ms) L-R Lat (ms) L Amp (mV) R Amp (mV) L-R Amp (%) Site1 Site2 L Vel (m/s) R Vel (m/s) L-R Vel (m/s)  Median Motor (Abd Poll Brev)  31.6C  Wrist  3.4   *2.1  Elbow Wrist  51   Elbow  7.7   4.1        Ulnar Motor (Abd Dig Min)  31.3C  Wrist  2.7   9.3  B Elbow Wrist  56   B Elbow  6.6   8.8  A Elbow B Elbow  71   A Elbow  8.0   8.6           Waveforms:

## 2018-03-21 NOTE — Progress Notes (Signed)
[name] - 47 y.o. female MRN [id number]  Date of birth: [birth year]  Office Visit Note: Visit Date: 03/13/2018 PCP: Crecencio Mc, MD Referred by: Crecencio Mc, MD  Subjective: Chief Complaint  Patient presents with  . Right Hand - Pain, Numbness   HPI: [name] is a 47 y.o. female who comes in today Evaluation and electrodiagnostic study at the request of Dr.Shawn Dalton-Bethea.  She is a right-hand-dominant female with aching in the right arm that starts in the right shoulder and refers down the arm in a somewhat nondermatomal pattern but mostly dorsal and posterior.  She reports aching numbness particularly in the right first digit but also in the fifth digit.  She reports some triggering of the fifth finger as it gets stuck.  She reports not being able to sleep on the right side because of the pain in the shoulder.  She also reports difficulty maintaining grip in the right hand.  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  Really anything repetitive seems to worsen her symptoms.  She also has increasing symptoms with anything that requires lifting or reaching up.  She rates her pain as a 6 out of 10.  It does affect her activities of daily living.  She is failed conservative care at this point including medication and other treatment.  She has not had prior electrodiagnostic study.  She has no left-sided complaints.  She does have MRI arthrogram of the right shoulder that was completed in May.  This shows severe tendinosis.  She has had prior shoulder surgery and rotator cuff repair.  She is also had a CT scan of her neck but no MRI of the cervical spine.  CT scan did not show anything concerning.  Review of Systems  Constitutional: Negative for chills, fever, malaise/fatigue and weight loss.  HENT: Negative for hearing loss and sinus pain.   Eyes: Negative for blurred vision, double vision and photophobia.  Respiratory:  Negative for cough and shortness of breath.   Cardiovascular: Negative for chest pain, palpitations and leg swelling.  Gastrointestinal: Negative for abdominal pain, nausea and vomiting.  Genitourinary: Negative for flank pain.  Musculoskeletal: Positive for joint pain and neck pain. Negative for myalgias.  Skin: Negative for itching and rash.  Neurological: Positive for tingling and weakness. Negative for tremors and focal weakness.  Endo/Heme/Allergies: Negative.   Psychiatric/Behavioral: Negative for depression.  All other systems reviewed and are negative.  Otherwise per HPI.  Assessment & Plan: Visit Diagnoses:  1. Paresthesia of skin     Plan: Impression: Essentially NORMAL electrodiagnostic study of the right upper limb.  The reduced amplitude on the distal motor median nerve study is likely technical artifact as the amplitude was normal when stimulated at the elbow.  There is no significant electrodiagnostic evidence of nerve entrapment, brachial plexopathy or cervical radiculopathy.    **As you know, purely sensory or demyelinating radiculopathies and chemical radiculitis may not be detected with this particular electrodiagnostic study.  Recommendations: 1.  Follow-up with referring physician. 2.  Continue current management of symptoms.   Meds & Orders: No orders of the defined types were placed in this encounter.   Orders Placed This Encounter  Procedures  . NCV with EMG (electromyography)    Follow-up: No follow-ups on file.   Procedures: No procedures performed  EMG & NCV Findings: Evaluation of the right median motor nerve showed reduced amplitude (2.1 mV).  All  remaining nerves (as indicated in the following tables) were within normal limits.    All examined muscles (as indicated in the following table) showed no evidence of electrical instability.    Impression: Essentially NORMAL electrodiagnostic study of the right upper limb.  The reduced amplitude on  the distal motor median nerve study is likely technical artifact as the amplitude was normal when stimulated at the elbow.  There is no significant electrodiagnostic evidence of nerve entrapment, brachial plexopathy or cervical radiculopathy.    **As you know, purely sensory or demyelinating radiculopathies and chemical radiculitis may not be detected with this particular electrodiagnostic study.  Recommendations: 1.  Follow-up with referring physician. 2.  Continue current management of symptoms.  ___________________________ Laurence Spates FAAPMR Board Certified, American Board of Physical Medicine and Rehabilitation    Nerve Conduction Studies Anti Sensory Summary Table   Stim Site NR Peak (ms) Norm Peak (ms) P-T Amp (V) Norm P-T Amp Site1 Site2 Delta-P (ms) Dist (cm) Vel (m/s) Norm Vel (m/s)  Right Median Acr Palm Anti Sensory (2nd Digit)  33.7C  Wrist    3.5 <3.6 29.4 >10 Wrist Palm 1.6 0.0    Palm    1.9 <2.0 20.7         Right Radial Anti Sensory (Base 1st Digit)  32.3C  Wrist    2.2 <3.1 23.9  Wrist Base 1st Digit 2.2 0.0    Right Ulnar Anti Sensory (5th Digit)  33.4C  Wrist    3.1 <3.7 29.9 >15.0 Wrist 5th Digit 3.1 14.0 45 >38   Motor Summary Table   Stim Site NR Onset (ms) Norm Onset (ms) O-P Amp (mV) Norm O-P Amp Site1 Site2 Delta-0 (ms) Dist (cm) Vel (m/s) Norm Vel (m/s)  Right Median Motor (Abd Poll Brev)  31.6C  Wrist    3.4 <4.2 *2.1 >5 Elbow Wrist 4.3 22.0 51 >50  Elbow    7.7  4.1         Right Ulnar Motor (Abd Dig Min)  31.3C  Wrist    2.7 <4.2 9.3 >3 B Elbow Wrist 3.9 22.0 56 >53  B Elbow    6.6  8.8  A Elbow B Elbow 1.4 10.0 71 >53  A Elbow    8.0  8.6          EMG   Side Muscle Nerve Root Ins Act Fibs Psw Amp Dur Poly Recrt Int Fraser Din Comment  Right 1stDorInt Ulnar C8-T1 Nml Nml Nml Nml Nml 0 Nml Nml   Right Abd Poll Brev Median C8-T1 Nml Nml Nml Nml Nml 0 Nml Nml   Right ExtDigCom   Nml Nml Nml Nml Nml 0 Nml Nml   Right Triceps Radial C6-7-8 Nml Nml  Nml Nml Nml 0 Nml Nml   Right Deltoid Axillary C5-6 Nml Nml Nml Nml Nml 0 Nml Nml     Nerve Conduction Studies Anti Sensory Left/Right Comparison   Stim Site L Lat (ms) R Lat (ms) L-R Lat (ms) L Amp (V) R Amp (V) L-R Amp (%) Site1 Site2 L Vel (m/s) R Vel (m/s) L-R Vel (m/s)  Median Acr Palm Anti Sensory (2nd Digit)  33.7C  Wrist  3.5   29.4  Wrist Palm     Palm  1.9   20.7        Radial Anti Sensory (Base 1st Digit)  32.3C  Wrist  2.2   23.9  Wrist Base 1st Digit     Ulnar Anti Sensory (5th Digit)  33.4C  Wrist  3.1   29.9  Wrist 5th Digit  45    Motor Left/Right Comparison   Stim Site L Lat (ms) R Lat (ms) L-R Lat (ms) L Amp (mV) R Amp (mV) L-R Amp (%) Site1 Site2 L Vel (m/s) R Vel (m/s) L-R Vel (m/s)  Median Motor (Abd Poll Brev)  31.6C  Wrist  3.4   *2.1  Elbow Wrist  51   Elbow  7.7   4.1        Ulnar Motor (Abd Dig Min)  31.3C  Wrist  2.7   9.3  B Elbow Wrist  56   B Elbow  6.6   8.8  A Elbow B Elbow  71   A Elbow  8.0   8.6           Waveforms:            Clinical History: MRI Arthrogram right shoulder 10/14/2017  IMPRESSION: 1. Prior rotator cuff repair. Severe increased T2 hyperintense signal at the supraspinatus and infraspinatus tendon insertion most concerning for severe tendinosis with a possible small partial-thickness articular surface tear. 2. Severe tendinosis of the intra-articular portion of the long head of the biceps tendon.   Electronically Signed By: Kathreen Devoid On: 10/14/2017 08:31  CT CERVICAL SPINE WITHOUT CONTRAST   CT CERVICAL SPINE FINDINGS  Alignment: Normal.  No vertebral body subluxation.  Skull base and vertebrae: No fracture line or displaced fracture fragment. Facet joints appear intact and normally aligned throughout.  Soft tissues and spinal canal: No prevertebral fluid or swelling. No visible canal hematoma.  Disc levels:  Disc spaces are well preserved throughout.  Upper chest: Negative.  Other:  None.  IMPRESSION: 1. Negative head CT. No intracranial hemorrhage or edema. No skull fracture. 2. Negative cervical spine CT. No fracture or dislocation within the cervical spine.   Electronically Signed   By: Franki Cabot M.D.   On: 06/20/2017 21:07   She reports that she has never smoked. She has never used smokeless tobacco.  Recent Labs    07/05/17 0000 11/20/17 0808 01/23/18 0959  HGBA1C 7.7* 9.2* 10.0*    Objective:  VS:  HT:    WT:   BMI:     BP:   HR: bpm  TEMP: ( )  RESP:  Physical Exam  Constitutional: She is oriented to person, place, and time. She appears well-developed and well-nourished. No distress.  HENT:  Head: Normocephalic and atraumatic.  Nose: Nose normal.  Mouth/Throat: Oropharynx is clear and moist.  Eyes: Pupils are equal, round, and reactive to light. Conjunctivae are normal.  Neck: No JVD present. No tracheal deviation present.  Cardiovascular: Regular rhythm and intact distal pulses.  Pulmonary/Chest: Effort normal. No respiratory distress.  Abdominal: She exhibits no distension. There is no guarding.  Musculoskeletal:  Patient sits with forward flexed cervical spine she does have some decreased range of motion with volitional movement left and right.  Some pain with extension.  Inspection reveals no atrophy of the bilateral APB or FDI or hand intrinsics. There is no swelling, color changes, allodynia or dystrophic changes. There is 5 out of 5 strength in the bilateral wrist extension, finger abduction and long finger flexion.  There is subjective impaired sensation right more than left in a nondermatomal distribution and on peripheral nerve distribution. There is a negative Tinel's test at the bilateral wrist and elbow. There is a negative Phalen's test bilaterally. There is a negative Hoffmann's test bilaterally.  Lymphadenopathy:  She has no cervical adenopathy.  Neurological: She is alert and oriented to person, place, and time. She  exhibits normal muscle tone. Coordination normal.  Skin: Skin is warm. No rash noted. No erythema.  Psychiatric: She has a normal mood and affect. Her behavior is normal.  Nursing note and vitals reviewed.   Ortho Exam Imaging: No results found.  Past Medical/Family/Surgical/Social History: Medications & Allergies reviewed per EMR, new medications updated. Patient Active Problem List   Diagnosis Date Noted  . Other fatigue 01/23/2018  . Shortness of breath on exertion 01/23/2018  . Type 2 diabetes mellitus without complication, with long-term current use of insulin ([place]) 01/23/2018  . Multinodular goiter 12/05/2017  . Multiple thyroid nodules 12/05/2017  . Microalbuminuria due to type 2 diabetes mellitus ([place]) 11/26/2017  . Pharyngitis 11/26/2017  . Encounter for preventive health examination 11/26/2017  . Abnormal TSH 11/26/2017  . Somnolence, daytime 11/14/2017  . Menopause syndrome 06/10/2017  . Adverse reaction to vaccine, sequela 03/25/2017  . Atypical chest pain 02/27/2017  . PAC (premature atrial contraction) 02/27/2017  . PVC's (premature ventricular contractions) 02/27/2017  . PSVT (paroxysmal supraventricular tachycardia) ([place]) 11/26/2016  . History of palpitations 11/08/2016  . Nontraumatic incomplete tear of right rotator cuff 07/14/2016  . AC (acromioclavicular) arthritis 07/14/2016  . Impingement syndrome of right shoulder 07/14/2016  . Fibrocystic breast changes, right 05/21/2016  . Menopausal symptoms 10/30/2015  . Insomnia 10/16/2015  . Snoring 07/26/2015  . Migraine without aura and without status migrainosus, not intractable 02/24/2015  . Muscle spasm 02/24/2015  . Facial paresthesia 02/24/2015  . Concussion with loss of consciousness 07/21/2014  . Hyperlipidemia associated with type 2 diabetes mellitus ([place]) 06/13/2014  . Pain in joint, shoulder region 08/11/2013  . Vitamin D deficiency 05/07/2013  . Chronic migraine 11/20/2012  . S/P Total Abdominal  Hysterectomy and Left Salpingo-oophorectomy 10/20/2011  . Headache disorder   . Uncontrolled type 2 diabetes mellitus ([place]) 07/20/2011  . Obesity (BMI 30-39.9) 07/20/2011  . Essential hypertension 07/20/2011   Past Medical History:  Diagnosis Date  . Allergy   . Anemia   . Anxiety    claustrophobic  . Asthma   . Back pain   . Biceps tendonosis of right shoulder   . Diabetes mellitus 2011   did not start metforfin, losing weight  . Dyspnea   . Fatty liver   . Food allergy   . Headache disorder   . History of concussion   . HLD (hyperlipidemia)   . Hypertension   . IBS (irritable bowel syndrome)   . Infertility, female   . Joint pain   . Lactose intolerance   . Leg edema   . Migraines   . Other specified disorders of thyroid   . Palpitation   . Post-menopausal   . Seborrheic dermatitis   . Shoulder impingement syndrome, right   . Vitamin D deficiency    Family History  Problem Relation Age of Onset  . Diabetes Mother   . Heart disease Mother 59  . Hypertension Mother   . Hyperlipidemia Mother   . Heart failure Mother   . Stroke Mother   . Thyroid disease Mother   . Depression Mother   . Sleep apnea Mother   . Obesity Mother   . Cancer Father 85       Lung Cancer  . Heart disease Father   . Mental illness Sister        bipolar, substance abuse,  clean 2 yrs  .  Hypertension Sister   . Cancer Paternal Grandmother 50       breast cancer  . Breast cancer Paternal Grandmother   . Diabetes Maternal Grandmother   . Hypertension Maternal Grandmother   . Diabetes Son    Past Surgical History:  Procedure Laterality Date  . ABDOMINAL HYSTERECTOMY  2006   heavy menses, endometriosis, l oophrectomy  . BACK SURGERY    . BREAST BIOPSY Right 2018   benign  . BREAST EXCISIONAL BIOPSY Right 2003   benign  . BREAST EXCISIONAL BIOPSY Right 1999   benign  . BREAST SURGERY     right breast x 2 , benign  . HERNIA REPAIR  2003   left inguinal   . LEFT OOPHORECTOMY      . SHOULDER ARTHROSCOPY WITH SUBACROMIAL DECOMPRESSION AND OPEN ROTATOR C Right 07/14/2016   Procedure: RIGHT SHOULDER ARTHROSCOPY WITH SUBACROMIAL DECOMPRESSION, DISTAL CLAVICLE RESECTION AND MINI OPEN ROTATOR CUFF REPAIR, OPEN BICEP TENDODESIS;  Surgeon: Garald Balding, MD;  Location: [facility];  Service: Orthopedics;  Laterality: Right;  . SHOULDER CLOSED REDUCTION Right 09/08/2016   Procedure: RIGHT CLOSED MANIPULATION SHOULDER;  Surgeon: Garald Balding, MD;  Location: [facility];  Service: Orthopedics;  Laterality: Right;  . TUBAL LIGATION     Social History   Occupational History  . 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 Smoking status: Never Smoker  . Smokeless tobacco: Never Used  Substance and Sexual Activity  . Alcohol use: No  . Drug use: No  . Sexual activity: Not Currently    Partners: Male    Birth control/protection: Surgical

## 2018-03-26 ENCOUNTER — Ambulatory Visit: Payer: 59 | Admitting: Pharmacist

## 2018-03-26 ENCOUNTER — Encounter: Payer: Self-pay | Admitting: Pharmacist

## 2018-03-26 DIAGNOSIS — E119 Type 2 diabetes mellitus without complications: Secondary | ICD-10-CM

## 2018-03-26 DIAGNOSIS — Z794 Long term (current) use of insulin: Secondary | ICD-10-CM

## 2018-03-26 DIAGNOSIS — E1165 Type 2 diabetes mellitus with hyperglycemia: Secondary | ICD-10-CM

## 2018-03-26 MED ORDER — INSULIN DEGLUDEC 100 UNIT/ML ~~LOC~~ SOLN: 30.0000 [IU] | Freq: Every day | SUBCUTANEOUS | 1 refills | Status: DC

## 2018-03-26 NOTE — Assessment & Plan Note (Signed)
#  Diabetes- Currently uncontrolled, most recent A1c 10.0% on 01/23/2018, worsened from 9.2% in 10/2017.; Goal A1c <7%. Reports adherence to medications. Control limited by poor dietary choices. Further insulin titration in combination with dietary modifications is warranted. Discussed potential of SGLT2 therapy; however, patient noted that she frequently has UTIs/yeast infections, so this class would not be appropriate for her. - Increase Tresiba to 30 units daily - Continue metformin XR 1000 mg BID - Extensively counseled on dietary interventions; discussed small, easy evening snacks she could keep on hand to keep from feeling poorly overnight - Encouraged to check SMBG more frequently, at least fastings daily and possibly 2 hour post prandial a few days a week

## 2018-03-26 NOTE — Patient Instructions (Signed)
It was great to see you today!  1) Increase Tresiba to 30 units daily.  2) Check fasting blood sugars daily. If you can, try checking 2 hours after a meal a few days a week.   Follow up with weight management as scheduled. Discuss some good snack/small supper options that would satisfy you and not be boring! Keep up the great work keeping an eye on portions.   Schedule follow up with me in 4-6 weeks. Feel free to reach out with any questions or concerns

## 2018-03-26 NOTE — Progress Notes (Addendum)
S:     Chief Complaint  Patient presents with  . Medication Management    Diabetes    Patient arrives in good spirits, ambulating without assistance.  Presents for diabetes evaluation, education, and management at the request of Dr. Derrel Nip (referred in nurse triage phone call on 02/14/2018). Last seen by primary care provider on 11/24/2017; at that time, it was noted that patient discontinued Trulicity after a sore throat/trouble swallowing, and was found to have a multinodular thyroid. She does not want to restart GLP1s after that, and reading the [facility] Box warning for medullary thyroid cancer. Last seen in [facility] on 02/26/2018 - at that time, metformin was maximized and [name] was started.   She was given instructions to titrate to fastings <130, but she has continued [place] at 25 units daily. She has only checked fastings ~7 times in the past 30 days, and remains uncontrolled. She notes some initial GI upset when she increased metformin, but that resolved after a few days. She notes that with family and school stressors, she has been making poor snacking choices recently. She has follow up with weight management on Monday.   She does note significant stressors in her life recently - two family members have died, and she reports a persistent headache since one of the funerals last Wednesday. She has been following with her headache specialist who is continuing to adjust her headache medications.   Patient reports diabetes was diagnosed first with gestational DM 15 years ago; She is unsure when T2DM was diagnosed  Insurance coverage/medication affordability: [employer]   Patient reports adherence with medications.  Current diabetes medications include: Tresiba 25 units daily, metformin XR 2000 mg daily Current hypertension medications include: losartan-HCTZ 50-12.5 mg daily, metoprolol succinate 25 mg daily, diltiazem 240 mg dialy  Patient reports two episodes of  feeling "funny" after going a long time without eating, but denies any SMBG readings <70.  Patient reports hyperglycemic events (always being thirsty, but this could be attributed to dry mouth from imipramine therapy)   Breakfast: 2 boiled eggs, instant grits; water w/ flavoring packet Lunch: Roast beef, spinach; little bit of potato Afternoon snacks: cheese sticks,  Supper: Doesn't typically eat supper, will occasionally eat a snack before bed   O:  Physical Exam  Constitutional: She appears well-developed and well-nourished.  Vitals reviewed.   Review of Systems  All other systems reviewed and are negative.  Lab Results  Component Value Date   HGBA1C 10.0 (H) 01/23/2018   Vitals:   03/26/18 1625  BP: 107/70  Pulse: 75    Basic Metabolic Panel BMP Latest Ref Rng & Units 01/23/2018 07/27/2017 07/05/2017  Glucose 65 - 99 mg/dL 231(H) 165(H) CANCELED  BUN 6 - 24 mg/dL 13 16 CANCELED  Creatinine 0.57 - 1.00 mg/dL 0.82 0.87 CANCELED  BUN/Creat Ratio 9 - 23 16 18  -  Sodium 134 - 144 mmol/L 139 141 CANCELED  Potassium 3.5 - 5.2 mmol/L 4.1 3.9 CANCELED  Chloride 96 - 106 mmol/L 100 102 CANCELED  CO2 20 - 29 mmol/L 21 23 CANCELED  Calcium 8.7 - 10.2 mg/dL 9.4 9.6 CANCELED     Lipid Panel     Component Value Date/Time   CHOL 130 01/23/2018 0959   TRIG 87 01/23/2018 0959   HDL 35 (L) 01/23/2018 0959   CHOLHDL 4.0 07/27/2017 0000   CHOLHDL 4 03/22/2017 1026   VLDL 22.2 03/22/2017 1026   LDLCALC 78 01/23/2018 0959   LDLDIRECT 98.0  08/17/2016 1555    Fasting SMBG:  7 day average: 201 (2 readings) 30 day average: 167 (7 readings)  Clinical ASCVD: No  ASCVD risk factors: age 40-75  A/P: Following discussion and approval by Dr. Derrel Nip, the following medication changes were made:   #Diabetes- Currently uncontrolled, most recent A1c 10.0% on 01/23/2018, worsened from 9.2% in 10/2017.; Goal A1c <7%. Reports adherence to medications. Control limited by poor dietary choices.  Further insulin titration in combination with dietary modifications is warranted. Discussed potential of SGLT2 therapy; however, patient noted that she frequently has UTIs/yeast infections, so this class would not be appropriate for her. - Increase Tresiba to 30 units daily - Continue metformin XR 1000 mg BID - Extensively counseled on dietary interventions; discussed small, easy evening snacks she could keep on hand to keep from feeling poorly overnight - Encouraged to check SMBG more frequently, at least fastings daily and possibly 2 hour post prandial a few days a week   Written patient instructions provided.  Total time in face to face counseling 45 minutes.    Follow up in Pharmacist Clinic Visit 4 weeks.   De Hollingshead, PharmD PGY2 Ambulatory Care Pharmacy Resident Phone: [phone]  I have reviewed the above information and agree with above.   Deborra Medina, MD

## 2018-04-02 ENCOUNTER — Ambulatory Visit (INDEPENDENT_AMBULATORY_CARE_PROVIDER_SITE_OTHER): Payer: 59 | Admitting: Family Medicine

## 2018-04-02 VITALS — BP 115/78 | HR 78 | Temp 97.9°F | Ht 66.0 in | Wt 213.0 lb

## 2018-04-02 DIAGNOSIS — Z6834 Body mass index (BMI) 34.0-34.9, adult: Secondary | ICD-10-CM

## 2018-04-02 DIAGNOSIS — Z794 Long term (current) use of insulin: Secondary | ICD-10-CM | POA: Diagnosis not present

## 2018-04-02 DIAGNOSIS — Z9189 Other specified personal risk factors, not elsewhere classified: Secondary | ICD-10-CM

## 2018-04-02 DIAGNOSIS — E669 Obesity, unspecified: Secondary | ICD-10-CM

## 2018-04-02 DIAGNOSIS — E559 Vitamin D deficiency, unspecified: Secondary | ICD-10-CM

## 2018-04-02 DIAGNOSIS — E119 Type 2 diabetes mellitus without complications: Secondary | ICD-10-CM

## 2018-04-03 MED ORDER — VITAMIN D (ERGOCALCIFEROL) 1.25 MG (50000 UNIT) PO CAPS: 50000.0000 [IU] | ORAL_CAPSULE | ORAL | 0 refills | Status: DC

## 2018-04-03 MED FILL — VIT D2 1.25 MG (50,000 UNIT: 1.25 MG | 28 days supply | Qty: 4 | Fill #0

## 2018-04-03 NOTE — Progress Notes (Signed)
Office: [phone]  /  Fax: [phone]   HPI:   Chief Complaint: OBESITY [name] is here to discuss her progress with her obesity treatment plan. She is on the  follow the Category 3 plan no bread and is following her eating plan approximately 30 % of the time. She states she is exercising 0 minutes 0 times per week. [name] has had difficult time following her diet plan with 2 family funerals and increased stress at work and home, but she has been trying to portion control and make smarter choices.  Her weight is 213 lb (96.6 kg) today and has had a weight loss of 1 pounds over a period of 6 weeks since her last visit. She has lost 3 lbs since starting treatment with [place].  Vitamin D deficiency [name] has a diagnosis of vitamin D deficiency. She is currently taking vit D and denies nausea, vomiting or muscle weakness.  Diabetes II [name] has a diagnosis of diabetes type II. [name]  Has had her [name] and Metformin adjusted by her PCP but states she does not check sugars daily. She states her fasting BGs range between 130-155 most days when she does check and denies any hypoglycemic episodes. Last A1c was 10.0. She has been working on intensive lifestyle modifications including diet, exercise, and weight loss to help control her blood glucose levels.  At risk for cardiovascular disease [name] is at a higher than average risk for cardiovascular disease due to obesity. She currently denies any chest pain.  ALLERGIES:  MEDICATIONS: Current Outpatient Medications on File Prior to Visit  Medication Sig Dispense Refill  . ALPRAZolam (XANAX) 0.5 MG tablet Take 0.5 mg by mouth 3 (three) times daily as needed for anxiety.    [name] aspirin EC 81 MG tablet Take 81 mg by mouth daily.    [name] atorvastatin (LIPITOR) 20 MG tablet TAKE 1 TABLET BY MOUTH DAILY. 90 tablet 1  . Blood Glucose Monitoring Suppl (FREESTYLE LITE) DEVI 1 Device by Does not apply route 2 (two) times daily. Please use [facility] if  insurance covers for meter, lancets and strips 1 each 0  . butalbital-acetaminophen-caffeine (FIORICET, ESGIC) 50-325-40 MG tablet Take 1 tablet by mouth every 6 (six) hours as needed for headache. Do not refill in less than 30 day 12 tablet 5  . diazepam (VALIUM) 10 MG tablet Take 1 tablet (10 mg total) by mouth every 12 (twelve) hours as needed (muscle spasm). 30 tablet 3  . diltiazem (CARDIZEM LA) 240 MG 24 hr tablet TAKE 1 TABLET BY MOUTH DAILY 90 tablet 1  . EPINEPHrine 0.3 mg/0.3 mL IJ SOAJ injection Inject 0.3 mg into the muscle as needed (for allergic reaction).   3  . Galcanezumab-gnlm (EMGALITY) 120 MG/ML SOAJ Inject 240 mg into the skin as directed AND 120 mg every 30 (thirty) days. Inj 240mg  once then 120mg  monthly. 1 pen 11  . glucose blood (FREESTYLE TEST STRIPS) test strip Use as instructed 100 each 0  . imipramine (TOFRANIL) 25 MG tablet Take 25 mg by mouth at bedtime.    . Insulin Degludec (TRESIBA) 100 UNIT/ML SOLN Inject 30 Units into the skin daily. Titrate as instructed. Max daily dose: 40 units 12 mL 1  . Insulin Pen Needle (PEN NEEDLES) 32G X 5 MM MISC 1 pen by Does not apply route daily. 100 each 3  . Insulin Syringes, Disposable, U-100 1 ML MISC Use with NPH insulin 60 each 11  . ketorolac (TORADOL) 10 MG tablet Take 1  tablet (10 mg total) by mouth every 6 (six) hours as needed. 30 tablet 1  . Lancets (FREESTYLE) lancets Use as instructed 100 each 0  . losartan-hydrochlorothiazide (HYZAAR) 50-12.5 MG tablet TAKE 1 TABLET BY MOUTH DAILY 90 tablet 2  . meclizine (ANTIVERT) 25 MG tablet Take 25 mg by mouth 3 (three) times daily as needed for dizziness.    . metFORMIN (GLUCOPHAGE XR) 500 MG 24 hr tablet Take 2 tablets (1,000 mg total) by mouth 2 (two) times daily. 60 tablet 2  . metoprolol succinate (TOPROL-XL) 25 MG 24 hr tablet Take 12.5 mg by mouth daily.    . metoprolol tartrate (LOPRESSOR) 25 MG tablet TAKE 1/2 TABLET BY MOUTH DAILY. 45 tablet 0  . ondansetron (ZOFRAN ODT)  4 MG disintegrating tablet Take 1 tablet (4 mg total) by mouth every 8 (eight) hours as needed for nausea or vomiting. 20 tablet 0  . oxyCODONE-acetaminophen (ROXICET) 5-325 MG tablet Take 1-2 tablets by mouth every 4 (four) hours as needed for severe pain. 40 tablet 0  . spironolactone (ALDACTONE) 25 MG tablet Take 1 tablet (25 mg total) by mouth daily. As needed for fluid retention 30 tablet 3   No current facility-administered medications on file prior to visit.     PAST MEDICAL HISTORY: Past Medical History:  Diagnosis Date  . Allergy   . Anemia   . Anxiety    claustrophobic  . Asthma   . Back pain   . Biceps tendonosis of right shoulder   . Diabetes mellitus 2011   did not start metforfin, losing weight  . Dyspnea   . Fatty liver   . Food allergy   . Headache disorder   . History of concussion   . HLD (hyperlipidemia)   . Hypertension   . IBS (irritable bowel syndrome)   . Infertility, female   . Joint pain   . Lactose intolerance   . Leg edema   . Migraines   . Other specified disorders of thyroid   . Palpitation   . Post-menopausal   . Seborrheic dermatitis   . Shoulder impingement syndrome, right   . Vitamin D deficiency     PAST SURGICAL HISTORY: Past Surgical History:  Procedure Laterality Date  . ABDOMINAL HYSTERECTOMY  2006   heavy menses, endometriosis, l oophrectomy  . BACK SURGERY    . BREAST BIOPSY Right 2018   benign  . BREAST EXCISIONAL BIOPSY Right 2003   benign  . BREAST EXCISIONAL BIOPSY Right 1999   benign  . BREAST SURGERY     right breast x 2 , benign  . HERNIA REPAIR  2003   left inguinal   . LEFT OOPHORECTOMY    . SHOULDER ARTHROSCOPY WITH SUBACROMIAL DECOMPRESSION AND OPEN ROTATOR C Right 07/14/2016   Procedure: RIGHT SHOULDER ARTHROSCOPY WITH SUBACROMIAL DECOMPRESSION, DISTAL CLAVICLE RESECTION AND MINI OPEN ROTATOR CUFF REPAIR, OPEN BICEP TENDODESIS;  Surgeon: Garald Balding, MD;  Location: [facility];  Service:  Orthopedics;  Laterality: Right;  . SHOULDER CLOSED REDUCTION Right 09/08/2016   Procedure: RIGHT CLOSED MANIPULATION SHOULDER;  Surgeon: Garald Balding, MD;  Location: [facility];  Service: Orthopedics;  Laterality: Right;  . TUBAL LIGATION      SOCIAL HISTORY: Social History   Tobacco Use  . Smoking status: Never Smoker  . Smokeless tobacco: Never Used  Substance Use Topics  . Alcohol use: No  . Drug use: No    FAMILY HISTORY: Family History  Problem  Relation Age of Onset  . Diabetes Mother   . Heart disease Mother 31  . Hypertension Mother   . Hyperlipidemia Mother   . Heart failure Mother   . Stroke Mother   . Thyroid disease Mother   . Depression Mother   . Sleep apnea Mother   . Obesity Mother   . Cancer Father 79       Lung Cancer  . Heart disease Father   . Mental illness Sister        bipolar, substance abuse,  clean 2 yrs  . Hypertension Sister   . Cancer Paternal Grandmother 55       breast cancer  . Breast cancer Paternal Grandmother   . Diabetes Maternal Grandmother   . Hypertension Maternal Grandmother   . Diabetes Son     ROS: Review of Systems  Constitutional: Positive for weight loss.  Cardiovascular: Negative for chest pain.  Gastrointestinal: Negative for nausea and vomiting.  Musculoskeletal:       Negative for muscle weakness  Endo/Heme/Allergies:       Negative for hypoglycemia    PHYSICAL EXAM: Blood pressure 115/78, pulse 78, temperature 97.9 F (36.6 C), temperature source Oral, height 5\' 6"  (1.676 m), weight 213 lb (96.6 kg), SpO2 99 %. Body mass index is 34.38 kg/m. Physical Exam  Constitutional: She is oriented to person, place, and time. She appears well-developed and well-nourished.  HENT:  Head: Normocephalic.  Neck: Normal range of motion.  Cardiovascular: Normal rate.  Pulmonary/Chest: Effort normal.  Musculoskeletal: Normal range of motion.  Neurological: She is alert and oriented to person, place,  and time.  Skin: Skin is warm and dry.  Psychiatric: She has a normal mood and affect. Her behavior is normal.  Vitals reviewed.   RECENT LABS AND TESTS: BMET    Component Value Date/Time   NA 139 01/23/2018 0959   NA 141 06/14/2012 1524   K 4.1 01/23/2018 0959   K 3.6 06/14/2012 1524   CL 100 01/23/2018 0959   CL 110 (H) 06/14/2012 1524   CO2 21 01/23/2018 0959   CO2 27 06/14/2012 1524   GLUCOSE 231 (H) 01/23/2018 0959   GLUCOSE 161 (H) 06/20/2017 2002   GLUCOSE 88 06/14/2012 1524   BUN 13 01/23/2018 0959   BUN 17 06/14/2012 1524   CREATININE 0.82 01/23/2018 0959   CREATININE 0.83 06/14/2013 1608   CALCIUM 9.4 01/23/2018 0959   CALCIUM 9.0 06/14/2012 1524   GFRNONAA 85 01/23/2018 0959   GFRNONAA >60 06/14/2012 1524   GFRAA 98 01/23/2018 0959   GFRAA >60 06/14/2012 1524   Lab Results  Component Value Date   HGBA1C 10.0 (H) 01/23/2018   HGBA1C 9.2 (H) 11/20/2017   HGBA1C 7.7 (H) 07/05/2017   HGBA1C 8.3 (H) 03/22/2017   HGBA1C 7.0 (H) 11/18/2016   Lab Results  Component Value Date   INSULIN 26.5 (H) 01/23/2018   CBC    Component Value Date/Time   WBC 7.2 01/23/2018 0959   WBC 7.7 11/24/2017 1612   RBC 4.55 01/23/2018 0959   RBC 4.46 11/24/2017 1612   HGB 13.1 01/23/2018 0959   HCT 40.9 01/23/2018 0959   PLT 185 11/24/2017 1612   PLT 172 06/14/2012 1524   MCV 90 01/23/2018 0959   MCV 90 06/14/2012 1524   MCH 28.8 01/23/2018 0959   MCH 29.8 11/24/2017 1612   MCHC 32.0 01/23/2018 0959   MCHC 34.6 11/24/2017 1612   RDW 13.2 01/23/2018 0959   RDW 13.3  06/14/2012 1524   LYMPHSABS 2.8 01/23/2018 0959   MONOABS 0.7 06/14/2013 1608   EOSABS 0.2 01/23/2018 0959   BASOSABS 0.0 01/23/2018 0959   Iron/TIBC/Ferritin/ %Sat No results found for: IRON, TIBC, FERRITIN, IRONPCTSAT Lipid Panel     Component Value Date/Time   CHOL 130 01/23/2018 0959   TRIG 87 01/23/2018 0959   HDL 35 (L) 01/23/2018 0959   CHOLHDL 4.0 07/27/2017 0000   CHOLHDL 4 03/22/2017 1026     VLDL 22.2 03/22/2017 1026   LDLCALC 78 01/23/2018 0959   LDLDIRECT 98.0 08/17/2016 1555   Hepatic Function Panel     Component Value Date/Time   PROT 6.7 01/23/2018 0959   PROT 7.8 06/14/2012 1524   ALBUMIN 4.5 01/23/2018 0959   ALBUMIN 4.0 06/14/2012 1524   AST 18 01/23/2018 0959   AST 17 06/14/2012 1524   ALT 25 01/23/2018 0959   ALT 19 06/14/2012 1524   ALKPHOS 159 (H) 01/23/2018 0959   ALKPHOS 101 06/14/2012 1524   BILITOT 0.4 01/23/2018 0959   BILITOT 0.3 06/14/2012 1524   BILIDIR 0.1 11/20/2017 0808      Component Value Date/Time   TSH 0.935 01/23/2018 0959   TSH 0.35 (L) 11/24/2017 1612   TSH 1.06 11/07/2016 1831    Ref. Range 01/23/2018 09:59  Vitamin D, 25-Hydroxy Latest Ref Range: 30.0 - 100.0 ng/mL 20.9 (L)    ASSESSMENT AND PLAN: Vitamin D deficiency - Plan: Vitamin D, Ergocalciferol, (DRISDOL) 50000 units CAPS capsule  Type 2 diabetes mellitus without complication, with long-term current use of insulin (HCC)  At risk for heart disease  Class 1 obesity with serious comorbidity and body mass index (BMI) of 34.0 to 34.9 in adult, unspecified obesity type  PLAN: Vitamin D Deficiency [name] was informed that low vitamin D levels contributes to fatigue and are associated with obesity, breast, and colon cancer. She agrees to continue to take prescription Vit D @50 ,000 IU every week #4 with no refills and will follow up for routine testing of vitamin D, at least 2-3 times per year. She was informed of the risk of over-replacement of vitamin D and agrees to not increase her dose unless she discusses this with [place] first. Agrees to follow up with our clinic as directed. We will repeat labs in 3 weeks.   Diabetes II [name] has been given extensive diabetes education by myself today including ideal fasting and post-prandial blood glucose readings, individual ideal HgA1c goals  and hypoglycemia prevention. We discussed the importance of good blood sugar control to  decrease the likelihood of diabetic complications such as nephropathy, neuropathy, limb loss, blindness, coronary artery disease, and death. We discussed the importance of intensive lifestyle modification including diet, exercise and weight loss as the first line treatment for diabetes. [name] agrees to continue her diabetes medications and will follow up at the agreed upon time. We will repeat labs in 3 weeks.   Cardiovascular risk counseling [name] was given extended (15 minutes) coronary artery disease prevention counseling today. She is 47 y.o. female and has risk factors for heart disease including obesity. We discussed intensive lifestyle modifications today with an emphasis on specific weight loss instructions and strategies. Pt was also informed of the importance of increasing exercise and decreasing saturated fats to help prevent heart disease.  Obesity [name] is currently in the action stage of change. As such, her goal is to continue with weight loss efforts She has agreed to follow the Category 3 plan [name] has been instructed to  work up to a goal of 150 minutes of combined cardio and strengthening exercise per week for weight loss and overall health benefits. We discussed the following Behavioral Modification Strategies today: increasing lean protein intake, decreasing simple carbohydrates, better snacking choices, and work on meal planning and easy cooking plans   [name] has agreed to follow up with our clinic in 3 weeks fasting. She was informed of the importance of frequent follow up visits to maximize her success with intensive lifestyle modifications for her multiple health conditions.   OBESITY BEHAVIORAL INTERVENTION VISIT  Today's visit was # 4   Starting weight: 216 lb Starting date: 01/23/18 Today's weight : 213 lb Today's date: 04/02/18 Total lbs lost to date: 3 lb    ASK: We discussed the diagnosis of obesity with [name] today and [name] agreed to give  [place] permission to discuss obesity behavioral modification therapy today.  ASSESS: [name] has the diagnosis of obesity and her BMI today is 34.4 [name] is in the action stage of change   ADVISE: [name] was educated on the multiple health risks of obesity as well as the benefit of weight loss to improve her health. She was advised of the need for long term treatment and the importance of lifestyle modifications to improve her current health and to decrease her risk of future health problems.  AGREE: Multiple dietary modification options and treatment options were discussed and  [name] agreed to follow the recommendations documented in the above note.  ARRANGE: [name] was educated on the importance of frequent visits to treat obesity as outlined per CMS and USPSTF guidelines and agreed to schedule her next follow up appointment today.  I, Renee Ramus, am acting as transcriptionist for Dennard Nip, MD   I have reviewed the above documentation for accuracy and completeness, and I agree with the above. -Dennard Nip, MD

## 2018-04-04 ENCOUNTER — Encounter (INDEPENDENT_AMBULATORY_CARE_PROVIDER_SITE_OTHER): Payer: Self-pay | Admitting: Family Medicine

## 2018-04-04 MED FILL — LOSARTAN-HCTZ 50-12.5 MG TA: 50-12.5 | 90 days supply | Qty: 90 | Fill #2

## 2018-04-09 MED FILL — BUTALB-ACETAMIN-CAFF 50-325: 50-325-40 | 30 days supply | Qty: 12 | Fill #2

## 2018-04-09 MED FILL — DILTIAZEM ER 240 MG TABLET: 240 | 90 days supply | Qty: 90 | Fill #1

## 2018-04-09 MED FILL — TRESIBA FLEXTOUCH 100 UNITS: 100 | 38 days supply | Qty: 15 | Fill #1

## 2018-04-09 MED FILL — metFORMIN HCL ER 500 MG TB2: 500 | 15 days supply | Qty: 60 | Fill #1

## 2018-04-23 ENCOUNTER — Ambulatory Visit (INDEPENDENT_AMBULATORY_CARE_PROVIDER_SITE_OTHER): Payer: 59 | Admitting: Family Medicine

## 2018-04-23 VITALS — BP 132/80 | HR 72 | Temp 98.4°F | Ht 66.0 in | Wt 215.0 lb

## 2018-04-23 DIAGNOSIS — Z794 Long term (current) use of insulin: Secondary | ICD-10-CM

## 2018-04-23 DIAGNOSIS — E119 Type 2 diabetes mellitus without complications: Secondary | ICD-10-CM

## 2018-04-23 DIAGNOSIS — E7849 Other hyperlipidemia: Secondary | ICD-10-CM

## 2018-04-23 DIAGNOSIS — E669 Obesity, unspecified: Secondary | ICD-10-CM | POA: Diagnosis not present

## 2018-04-23 DIAGNOSIS — E559 Vitamin D deficiency, unspecified: Secondary | ICD-10-CM

## 2018-04-23 DIAGNOSIS — Z6834 Body mass index (BMI) 34.0-34.9, adult: Secondary | ICD-10-CM

## 2018-04-23 DIAGNOSIS — Z9189 Other specified personal risk factors, not elsewhere classified: Secondary | ICD-10-CM | POA: Diagnosis not present

## 2018-04-24 LAB — COMPREHENSIVE METABOLIC PANEL
ALT: 20 IU/L (ref 0–32)
AST: 17 IU/L (ref 0–40)
Albumin/Globulin Ratio: 1.9 (ref 1.2–2.2)
Albumin: 4.3 g/dL (ref 3.5–5.5)
Alkaline Phosphatase: 149 IU/L — ABNORMAL HIGH (ref 39–117)
BUN/Creatinine Ratio: 16 (ref 9–23)
BUN: 12 mg/dL (ref 6–24)
Bilirubin Total: 0.4 mg/dL (ref 0.0–1.2)
CO2: 21 mmol/L (ref 20–29)
Calcium: 9.2 mg/dL (ref 8.7–10.2)
Chloride: 100 mmol/L (ref 96–106)
Creatinine, Ser: 0.76 mg/dL (ref 0.57–1.00)
GFR calc Af Amer: 107 mL/min/{1.73_m2} (ref >59)
GFR calc non Af Amer: 93 mL/min/{1.73_m2} (ref >59)
Globulin, Total: 2.3 g/dL (ref 1.5–4.5)
Glucose: 214 mg/dL — ABNORMAL HIGH (ref 65–99)
Potassium: 4 mmol/L (ref 3.5–5.2)
Sodium: 141 mmol/L (ref 134–144)
Total Protein: 6.6 g/dL (ref 6.0–8.5)

## 2018-04-24 LAB — VITAMIN D 25 HYDROXY (VIT D DEFICIENCY, FRACTURES): Vit D, 25-Hydroxy: 47.6 ng/mL (ref 30.0–100.0)

## 2018-04-24 LAB — LIPID PANEL
Chol/HDL Ratio: 3.5 ratio (ref 0.0–4.4)
Cholesterol, Total: 136 mg/dL (ref 100–199)
HDL: 39 mg/dL — ABNORMAL LOW (ref >39)
LDL Calculated: 83 mg/dL (ref 0–99)
Triglycerides: 71 mg/dL (ref 0–149)
VLDL Cholesterol Cal: 14 mg/dL (ref 5–40)

## 2018-04-24 LAB — HEMOGLOBIN A1C
Est. average glucose Bld gHb Est-mCnc: 186 mg/dL
Hgb A1c MFr Bld: 8.1 % — ABNORMAL HIGH (ref 4.8–5.6)

## 2018-04-24 LAB — INSULIN, RANDOM: INSULIN: 17.8 u[IU]/mL (ref 2.6–24.9)

## 2018-04-25 ENCOUNTER — Encounter (INDEPENDENT_AMBULATORY_CARE_PROVIDER_SITE_OTHER): Payer: Self-pay | Admitting: Family Medicine

## 2018-04-25 ENCOUNTER — Other Ambulatory Visit: Payer: Self-pay | Admitting: Internal Medicine

## 2018-04-25 DIAGNOSIS — E119 Type 2 diabetes mellitus without complications: Secondary | ICD-10-CM

## 2018-04-25 DIAGNOSIS — Z794 Long term (current) use of insulin: Secondary | ICD-10-CM

## 2018-04-25 MED FILL — metFORMIN HCL ER 500 MG TB2: 500 | 15 days supply | Qty: 60 | Fill #0

## 2018-04-25 NOTE — Progress Notes (Signed)
Office: [phone]  /  Fax: [phone]   HPI:   Chief Complaint: OBESITY [name] is here to discuss her progress with her obesity treatment plan. She is on the Category 3 plan and is following her eating plan approximately 30 % of the time. She states she is exercising 0 minutes 0 times per week. [name] has not been able to follow her plan closely. She has a lot of stressors and is not doing well with meal prepping, and has increased eating out.  Her weight is 215 lb (97.5 kg) today and has gained 2 pounds since her last visit. She has lost 1 lb since starting treatment with [place].  Diabetes II with Hyperglycemia [name] has a diagnosis of diabetes type II. Forever did not bring in BGs log today. She is attempting to work on intensive lifestyle modifications including diet, exercise, and weight loss to help control her blood glucose levels. She denies hypoglycemia. Last A1c was 8.1.  Hyperlipidemia [name] has hyperlipidemia and she is attempting to control her cholesterol levels with intensive lifestyle modification including a low saturated fat diet, exercise and weight loss. She is due for labs and denies any chest pain, claudication or myalgias.  At risk for cardiovascular disease [name] is at a higher than average risk for cardiovascular disease due to obesity, diabetes II, and hyperlipidemia. She currently denies any chest pain.  Vitamin D Deficiency [name] has a diagnosis of vitamin D deficiency. She is on prescription Vit D, and she is due for labs. She denies nausea, vomiting or muscle weakness.  ALLERGIES:  MEDICATIONS: Current Outpatient Medications on File Prior to Visit  Medication Sig Dispense Refill  . ALPRAZolam (XANAX) 0.5 MG tablet Take 0.5 mg by mouth 3 (three) times daily as needed for anxiety.    [name] aspirin EC 81 MG tablet Take 81 mg by mouth daily.    [name] atorvastatin (LIPITOR) 20 MG tablet TAKE 1 TABLET BY MOUTH DAILY. 90 tablet 1  . Blood Glucose Monitoring Suppl  (FREESTYLE LITE) DEVI 1 Device by Does not apply route 2 (two) times daily. Please use [employer] if insurance covers for meter, lancets and strips 1 each 0  . butalbital-acetaminophen-caffeine (FIORICET, ESGIC) 50-325-40 MG tablet Take 1 tablet by mouth every 6 (six) hours as needed for headache. Do not refill in less than 30 day 12 tablet 5  . diazepam (VALIUM) 10 MG tablet Take 1 tablet (10 mg total) by mouth every 12 (twelve) hours as needed (muscle spasm). 30 tablet 3  . diltiazem (CARDIZEM LA) 240 MG 24 hr tablet TAKE 1 TABLET BY MOUTH DAILY 90 tablet 1  . EPINEPHrine 0.3 mg/0.3 mL IJ SOAJ injection Inject 0.3 mg into the muscle as needed (for allergic reaction).   3  . Galcanezumab-gnlm (EMGALITY) 120 MG/ML SOAJ Inject 240 mg into the skin as directed AND 120 mg every 30 (thirty) days. Inj 240mg  once then 120mg  monthly. 1 pen 11  . glucose blood (FREESTYLE TEST STRIPS) test strip Use as instructed 100 each 0  . imipramine (TOFRANIL) 25 MG tablet Take 25 mg by mouth at bedtime.    . Insulin Degludec (TRESIBA) 100 UNIT/ML SOLN Inject 30 Units into the skin daily. Titrate as instructed. Max daily dose: 40 units 12 mL 1  . Insulin Pen Needle (PEN NEEDLES) 32G X 5 MM MISC 1 pen by Does not apply route daily. 100 each 3  . Insulin Syringes, Disposable, U-100 1 ML MISC Use with NPH insulin 60 each 11  .  ketorolac (TORADOL) 10 MG tablet Take 1 tablet (10 mg total) by mouth every 6 (six) hours as needed. 30 tablet 1  . Lancets (FREESTYLE) lancets Use as instructed 100 each 0  . losartan-hydrochlorothiazide (HYZAAR) 50-12.5 MG tablet TAKE 1 TABLET BY MOUTH DAILY 90 tablet 2  . meclizine (ANTIVERT) 25 MG tablet Take 25 mg by mouth 3 (three) times daily as needed for dizziness.    . metoprolol succinate (TOPROL-XL) 25 MG 24 hr tablet Take 12.5 mg by mouth daily.    . metoprolol tartrate (LOPRESSOR) 25 MG tablet TAKE 1/2 TABLET BY MOUTH DAILY. 45 tablet 0  . ondansetron (ZOFRAN ODT) 4 MG disintegrating  tablet Take 1 tablet (4 mg total) by mouth every 8 (eight) hours as needed for nausea or vomiting. 20 tablet 0  . oxyCODONE-acetaminophen (ROXICET) 5-325 MG tablet Take 1-2 tablets by mouth every 4 (four) hours as needed for severe pain. 40 tablet 0  . spironolactone (ALDACTONE) 25 MG tablet Take 1 tablet (25 mg total) by mouth daily. As needed for fluid retention 30 tablet 3  . Vitamin D, Ergocalciferol, (DRISDOL) 50000 units CAPS capsule Take 1 capsule (50,000 Units total) by mouth every 7 (seven) days. 4 capsule 0   No current facility-administered medications on file prior to visit.     PAST MEDICAL HISTORY: Past Medical History:  Diagnosis Date  . Allergy   . Anemia   . Anxiety    claustrophobic  . Asthma   . Back pain   . Biceps tendonosis of right shoulder   . Diabetes mellitus 2011   did not start metforfin, losing weight  . Dyspnea   . Fatty liver   . Food allergy   . Headache disorder   . History of concussion   . HLD (hyperlipidemia)   . Hypertension   . IBS (irritable bowel syndrome)   . Infertility, female   . Joint pain   . Lactose intolerance   . Leg edema   . Migraines   . Other specified disorders of thyroid   . Palpitation   . Post-menopausal   . Seborrheic dermatitis   . Shoulder impingement syndrome, right   . Vitamin D deficiency     PAST SURGICAL HISTORY: Past Surgical History:  Procedure Laterality Date  . ABDOMINAL HYSTERECTOMY  2006   heavy menses, endometriosis, l oophrectomy  . BACK SURGERY    . BREAST BIOPSY Right 2018   benign  . BREAST EXCISIONAL BIOPSY Right 2003   benign  . BREAST EXCISIONAL BIOPSY Right 1999   benign  . BREAST SURGERY     right breast x 2 , benign  . HERNIA REPAIR  2003   left inguinal   . LEFT OOPHORECTOMY    . SHOULDER ARTHROSCOPY WITH SUBACROMIAL DECOMPRESSION AND OPEN ROTATOR C Right 07/14/2016   Procedure: RIGHT SHOULDER ARTHROSCOPY WITH SUBACROMIAL DECOMPRESSION, DISTAL CLAVICLE RESECTION AND MINI OPEN  ROTATOR CUFF REPAIR, OPEN BICEP TENDODESIS;  Surgeon: Garald Balding, MD;  Location: [facility];  Service: Orthopedics;  Laterality: Right;  . SHOULDER CLOSED REDUCTION Right 09/08/2016   Procedure: RIGHT CLOSED MANIPULATION SHOULDER;  Surgeon: Garald Balding, MD;  Location: [facility];  Service: Orthopedics;  Laterality: Right;  . TUBAL LIGATION      SOCIAL HISTORY: Social History   Tobacco Use  . Smoking status: Never Smoker  . Smokeless tobacco: Never Used  Substance Use Topics  . Alcohol use: No  . Drug use: No  FAMILY HISTORY: Family History  Problem Relation Age of Onset  . Diabetes Mother   . Heart disease Mother 60  . Hypertension Mother   . Hyperlipidemia Mother   . Heart failure Mother   . Stroke Mother   . Thyroid disease Mother   . Depression Mother   . Sleep apnea Mother   . Obesity Mother   . Cancer Father 43       Lung Cancer  . Heart disease Father   . Mental illness Sister        bipolar, substance abuse,  clean 2 yrs  . Hypertension Sister   . Cancer Paternal Grandmother 73       breast cancer  . Breast cancer Paternal Grandmother   . Diabetes Maternal Grandmother   . Hypertension Maternal Grandmother   . Diabetes Son     ROS: Review of Systems  Constitutional: Negative for weight loss.  Cardiovascular: Negative for chest pain and claudication.  Gastrointestinal: Negative for nausea and vomiting.  Musculoskeletal: Negative for myalgias.       Negative muscle weakness  Endo/Heme/Allergies:       Negative hypoglycemia    PHYSICAL EXAM: Blood pressure 132/80, pulse 72, temperature 98.4 F (36.9 C), temperature source Oral, height 5\' 6"  (1.676 m), weight 215 lb (97.5 kg), SpO2 99 %. Body mass index is 34.7 kg/m. Physical Exam  Constitutional: She is oriented to person, place, and time. She appears well-developed and well-nourished.  Cardiovascular: Normal rate.  Pulmonary/Chest: Effort normal.    Musculoskeletal: Normal range of motion.  Neurological: She is oriented to person, place, and time.  Skin: Skin is warm and dry.  Psychiatric: She has a normal mood and affect. Her behavior is normal.  Vitals reviewed.   RECENT LABS AND TESTS: BMET    Component Value Date/Time   NA 141 04/23/2018 0932   NA 141 06/14/2012 1524   K 4.0 04/23/2018 0932   K 3.6 06/14/2012 1524   CL 100 04/23/2018 0932   CL 110 (H) 06/14/2012 1524   CO2 21 04/23/2018 0932   CO2 27 06/14/2012 1524   GLUCOSE 214 (H) 04/23/2018 0932   GLUCOSE 161 (H) 06/20/2017 2002   GLUCOSE 88 06/14/2012 1524   BUN 12 04/23/2018 0932   BUN 17 06/14/2012 1524   CREATININE 0.76 04/23/2018 0932   CREATININE 0.83 06/14/2013 1608   CALCIUM 9.2 04/23/2018 0932   CALCIUM 9.0 06/14/2012 1524   GFRNONAA 93 04/23/2018 0932   GFRNONAA >60 06/14/2012 1524   GFRAA 107 04/23/2018 0932   GFRAA >60 06/14/2012 1524   Lab Results  Component Value Date   HGBA1C 8.1 (H) 04/23/2018   HGBA1C 10.0 (H) 01/23/2018   HGBA1C 9.2 (H) 11/20/2017   HGBA1C 7.7 (H) 07/05/2017   HGBA1C 8.3 (H) 03/22/2017   Lab Results  Component Value Date   INSULIN 17.8 04/23/2018   INSULIN 26.5 (H) 01/23/2018   CBC    Component Value Date/Time   WBC 7.2 01/23/2018 0959   WBC 7.7 11/24/2017 1612   RBC 4.55 01/23/2018 0959   RBC 4.46 11/24/2017 1612   HGB 13.1 01/23/2018 0959   HCT 40.9 01/23/2018 0959   PLT 185 11/24/2017 1612   PLT 172 06/14/2012 1524   MCV 90 01/23/2018 0959   MCV 90 06/14/2012 1524   MCH 28.8 01/23/2018 0959   MCH 29.8 11/24/2017 1612   MCHC 32.0 01/23/2018 0959   MCHC 34.6 11/24/2017 1612   RDW 13.2 01/23/2018 0959  RDW 13.3 06/14/2012 1524   LYMPHSABS 2.8 01/23/2018 0959   MONOABS 0.7 06/14/2013 1608   EOSABS 0.2 01/23/2018 0959   BASOSABS 0.0 01/23/2018 0959   Iron/TIBC/Ferritin/ %Sat No results found for: IRON, TIBC, FERRITIN, IRONPCTSAT Lipid Panel     Component Value Date/Time   CHOL 136 04/23/2018  0932   TRIG 71 04/23/2018 0932   HDL 39 (L) 04/23/2018 0932   CHOLHDL 3.5 04/23/2018 0932   CHOLHDL 4 03/22/2017 1026   VLDL 22.2 03/22/2017 1026   LDLCALC 83 04/23/2018 0932   LDLDIRECT 98.0 08/17/2016 1555   Hepatic Function Panel     Component Value Date/Time   PROT 6.6 04/23/2018 0932   PROT 7.8 06/14/2012 1524   ALBUMIN 4.3 04/23/2018 0932   ALBUMIN 4.0 06/14/2012 1524   AST 17 04/23/2018 0932   AST 17 06/14/2012 1524   ALT 20 04/23/2018 0932   ALT 19 06/14/2012 1524   ALKPHOS 149 (H) 04/23/2018 0932   ALKPHOS 101 06/14/2012 1524   BILITOT 0.4 04/23/2018 0932   BILITOT 0.3 06/14/2012 1524   BILIDIR 0.1 11/20/2017 0808      Component Value Date/Time   TSH 0.935 01/23/2018 0959   TSH 0.35 (L) 11/24/2017 1612   TSH 1.06 11/07/2016 1831  Results for LEELOO, SILVERTHORNE (MRN [id number]) as of 04/25/2018 12:51  Ref. Range 04/23/2018 09:32  Vitamin D, 25-Hydroxy Latest Ref Range: 30.0 - 100.0 ng/mL 47.6    ASSESSMENT AND PLAN: Type 2 diabetes mellitus without complication, with long-term current use of insulin (HCC) - Plan: Comprehensive metabolic panel, Hemoglobin A1c, Insulin, random  Other hyperlipidemia - Plan: Lipid panel  Vitamin D deficiency - Plan: VITAMIN D 25 Hydroxy (Vit-D Deficiency, Fractures)  At risk for heart disease  Class 1 obesity with serious comorbidity and body mass index (BMI) of 34.0 to 34.9 in adult, unspecified obesity type  PLAN:  Diabetes II [name] has been given extensive diabetes education by myself today including ideal fasting and post-prandial blood glucose readings, individual ideal Hgb A1c goals and hypoglycemia prevention. We discussed the importance of good blood sugar control to decrease the likelihood of diabetic complications such as nephropathy, neuropathy, limb loss, blindness, coronary artery disease, and death. We discussed the importance of intensive lifestyle modification including diet, exercise and weight loss as the first  line treatment for diabetes. [name] agrees to continue her diabetes medications and diet, and we will check labs today. [name] agrees to follow up with our clinic in 3 to 4 weeks.  Hyperlipidemia [name] was informed of the American Heart Association Guidelines emphasizing intensive lifestyle modifications as the first line treatment for hyperlipidemia. We discussed many lifestyle modifications today in depth, and [name] will continue to work on decreasing saturated fats such as fatty red meat, butter and many fried foods. She will also increase vegetables and lean protein in her diet and continue to work on diet, exercise, and weight loss efforts. We will check labs today and Xochilt agrees to follow up with our clinic in 3 to 4 weeks.  Cardiovascular risk counselling [name] was given extended (15 minutes) coronary artery disease prevention counseling today. She is 47 y.o. female and has risk factors for heart disease including obesity, diabetes II, and hyperlipidemia. We discussed intensive lifestyle modifications today with an emphasis on specific weight loss instructions and strategies. Pt was also informed of the importance of increasing exercise and decreasing saturated fats to help prevent heart disease.  Vitamin D Deficiency [name] was informed that low vitamin  D levels contributes to fatigue and are associated with obesity, breast, and colon cancer. [name] agrees to continue taking prescription Vit D @50 ,000 IU every week and will follow up for routine testing of vitamin D, at least 2-3 times per year. She was informed of the risk of over-replacement of vitamin D and agrees to not increase her dose unless she discusses this with [place] first. We will check labs today and [name] agrees to follow up with our clinic in 3 to 4 weeks.  Obesity [name] is currently in the action stage of change. As such, her goal is to maintain weight for now She has agreed to follow the Category 3 plan [name] has been  instructed to work up to a goal of 150 minutes of combined cardio and strengthening exercise per week for weight loss and overall health benefits. We discussed the following Behavioral Modification Strategies today: increasing lean protein intake, decreasing simple carbohydrates, increasing vegetables, work on meal planning and easy cooking plans, emotional eating strategies, keeping healthy foods in the home, and better snacking choices [name]'s goal is to maintain weight over Thanksgiving.  [name] has agreed to follow up with our clinic in 3 to 4 weeks. She was informed of the importance of frequent follow up visits to maximize her success with intensive lifestyle modifications for her multiple health conditions.   OBESITY BEHAVIORAL INTERVENTION VISIT  Today's visit was # 5   Starting weight: 216 lbs Starting date: 01/23/18 Today's weight : 215 lbs  Today's date: 04/23/2018 Total lbs lost to date: 1    ASK: We discussed the diagnosis of obesity with [name] today and [name] agreed to give [place] permission to discuss obesity behavioral modification therapy today.  ASSESS: [name] has the diagnosis of obesity and her BMI today is 34.72 [name] is in the action stage of change   ADVISE: [name] was educated on the multiple health risks of obesity as well as the benefit of weight loss to improve her health. She was advised of the need for long term treatment and the importance of lifestyle modifications to improve her current health and to decrease her risk of future health problems.  AGREE: Multiple dietary modification options and treatment options were discussed and  [name] agreed to follow the recommendations documented in the above note.  ARRANGE: [name] was educated on the importance of frequent visits to treat obesity as outlined per CMS and USPSTF guidelines and agreed to schedule her next follow up appointment today.  I, Trixie Dredge, am acting as transcriptionist for  Dennard Nip, MD  I have reviewed the above documentation for accuracy and completeness, and I agree with the above. -Dennard Nip, MD

## 2018-05-02 MED FILL — ATORVASTATIN CALCIUM 20 MG: 20 | 90 days supply | Qty: 90 | Fill #1

## 2018-05-07 ENCOUNTER — Ambulatory Visit: Payer: Self-pay | Admitting: Pharmacist

## 2018-05-11 ENCOUNTER — Encounter: Payer: Self-pay | Admitting: Physician Assistant

## 2018-05-11 ENCOUNTER — Other Ambulatory Visit: Payer: Self-pay | Admitting: Physician Assistant

## 2018-05-11 ENCOUNTER — Encounter (INDEPENDENT_AMBULATORY_CARE_PROVIDER_SITE_OTHER): Payer: Self-pay | Admitting: Family Medicine

## 2018-05-11 ENCOUNTER — Ambulatory Visit (INDEPENDENT_AMBULATORY_CARE_PROVIDER_SITE_OTHER): Payer: 59 | Admitting: Physician Assistant

## 2018-05-11 VITALS — BP 130/76 | HR 77 | Wt 222.0 lb

## 2018-05-11 DIAGNOSIS — G44309 Post-traumatic headache, unspecified, not intractable: Secondary | ICD-10-CM

## 2018-05-11 DIAGNOSIS — G43009 Migraine without aura, not intractable, without status migrainosus: Secondary | ICD-10-CM

## 2018-05-11 DIAGNOSIS — G43709 Chronic migraine without aura, not intractable, without status migrainosus: Secondary | ICD-10-CM | POA: Diagnosis not present

## 2018-05-11 DIAGNOSIS — IMO0002 Reserved for concepts with insufficient information to code with codable children: Secondary | ICD-10-CM

## 2018-05-11 MED ORDER — ZONISAMIDE 100 MG PO CAPS: 100.0000 mg | ORAL_CAPSULE | Freq: Every day | ORAL | 0 refills | Status: DC

## 2018-05-11 MED ORDER — BACLOFEN 10 MG PO TABS: 10.0000 mg | ORAL_TABLET | Freq: Three times a day (TID) | ORAL | 0 refills | Status: DC
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
## 2018-05-11 NOTE — Patient Instructions (Signed)

## 2018-05-14 ENCOUNTER — Ambulatory Visit (INDEPENDENT_AMBULATORY_CARE_PROVIDER_SITE_OTHER): Payer: Self-pay | Admitting: Physician Assistant

## 2018-05-14 ENCOUNTER — Encounter (INDEPENDENT_AMBULATORY_CARE_PROVIDER_SITE_OTHER): Payer: Self-pay

## 2018-05-22 DIAGNOSIS — Z794 Long term (current) use of insulin: Secondary | ICD-10-CM

## 2018-05-22 DIAGNOSIS — E119 Type 2 diabetes mellitus without complications: Secondary | ICD-10-CM

## 2018-05-22 MED ORDER — METFORMIN HCL ER 500 MG PO TB24: ORAL_TABLET | ORAL | 0 refills | Status: DC

## 2018-05-22 MED FILL — metFORMIN HCL ER 500 MG TB2: 500 | 15 days supply | Qty: 60 | Fill #0

## 2018-05-23 MED FILL — EMGALITY 120 MG/ML SOAJ: 120 | 30 days supply | Qty: 1 | Fill #0

## 2018-06-04 ENCOUNTER — Telehealth: Payer: Self-pay | Admitting: Internal Medicine

## 2018-06-04 ENCOUNTER — Ambulatory Visit (INDEPENDENT_AMBULATORY_CARE_PROVIDER_SITE_OTHER): Payer: 59

## 2018-06-04 ENCOUNTER — Ambulatory Visit: Payer: 59 | Admitting: Pharmacist

## 2018-06-04 ENCOUNTER — Encounter: Payer: Self-pay | Admitting: Pharmacist

## 2018-06-04 VITALS — BP 114/83 | HR 85 | Temp 99.0°F | Wt 224.6 lb

## 2018-06-04 DIAGNOSIS — R0602 Shortness of breath: Secondary | ICD-10-CM | POA: Diagnosis not present

## 2018-06-04 DIAGNOSIS — J22 Unspecified acute lower respiratory infection: Secondary | ICD-10-CM | POA: Insufficient documentation

## 2018-06-04 DIAGNOSIS — R05 Cough: Secondary | ICD-10-CM | POA: Diagnosis not present

## 2018-06-04 DIAGNOSIS — Z794 Long term (current) use of insulin: Secondary | ICD-10-CM

## 2018-06-04 DIAGNOSIS — E119 Type 2 diabetes mellitus without complications: Secondary | ICD-10-CM

## 2018-06-04 LAB — CBC WITH DIFFERENTIAL/PLATELET
Basophils Absolute: 0 K/uL (ref 0.0–0.1)
Basophils Relative: 0.3 % (ref 0.0–3.0)
Eosinophils Absolute: 0.2 K/uL (ref 0.0–0.7)
Eosinophils Relative: 2.9 % (ref 0.0–5.0)
HCT: 41.1 % (ref 36.0–46.0)
Hemoglobin: 13.7 g/dL (ref 12.0–15.0)
Lymphocytes Relative: 34.8 % (ref 12.0–46.0)
Lymphs Abs: 2.8 K/uL (ref 0.7–4.0)
MCHC: 33.3 g/dL (ref 30.0–36.0)
MCV: 88.3 fl (ref 78.0–100.0)
Monocytes Absolute: 0.4 K/uL (ref 0.1–1.0)
Monocytes Relative: 5.1 % (ref 3.0–12.0)
Neutro Abs: 4.6 K/uL (ref 1.4–7.7)
Neutrophils Relative %: 56.9 % (ref 43.0–77.0)
Platelets: 179 K/uL (ref 150.0–400.0)
RBC: 4.66 Mil/uL (ref 3.87–5.11)
RDW: 13.4 % (ref 11.5–15.5)
WBC: 8.1 K/uL (ref 4.0–10.5)

## 2018-06-04 MED ORDER — METFORMIN HCL ER 500 MG PO TB24: ORAL_TABLET | ORAL | 3 refills | Status: DC

## 2018-06-04 MED ORDER — INSULIN DEGLUDEC 100 UNIT/ML ~~LOC~~ SOLN: 34.0000 [IU] | Freq: Every day | SUBCUTANEOUS | 1 refills | Status: DC

## 2018-06-04 NOTE — Progress Notes (Addendum)
S:     Chief Complaint  Patient presents with  . Medication Management    Diabetes    Patient arrives in good spirits, ambulating without assistance. She does endorse a 1 day history of a dry, non-productive cough with resulting chest discomfort and a scratchy throat. Denies body aches or fevers. She endorses a headache, but this has been going on for ~2 months now. Endorses sick contacts - her daughter was recently treated for influenza, bronchitis, and potentially pneumonia.  Presents for diabetes evaluation, education, and management at the request of Dr. Derrel Nip (referred in nurse triage phone call on 02/14/2018). Last seen by primary care provider on 11/24/2017; at that time, it was noted that patient discontinued Trulicity after a sore throat/trouble swallowing, and was found to have a multinodular thyroid. She does not want to restart GLP1s after that, and reading the [facility] Box warning for medullary thyroid cancer. Last seen in [facility] on 03/26/2018 - at that time, Tresiba dose was increased.   In the interim, she had appointments with weight management, but has decided to stop pursuing this program. She continues to struggle with migraines.   Patient reports diabetes was diagnosed first with gestational DM 15 years ago; She is unsure when T2DM was diagnosed  Insurance coverage/medication affordability: [employer]   Patient reports adherence with medications.  Current diabetes medications include: Tresiba 30 units daily, metformin XR 2000 mg daily - Prefers to avoid GLP1s d/t hx thyroid nodule coinciding with use; prefers to avoid SGLT2s d/t history of frequent yeast infections Current hypertension medications include: losartan-HCTZ 50-12.5 mg daily, metoprolol succinate 25 mg daily, diltiazem 240 mg daily  Breakfast: 3 pieces bacon + 1/2 grapefruit Lunch: skipped  Supper: 2 hot dogs w/ buns + chili + slaw Snacks: Grapes (a huge portion) Drink: Water   O:    Physical Exam Vitals signs reviewed.  Constitutional:      Appearance: Normal appearance. She is well-developed.     Review of Systems  Respiratory: Positive for cough (Dry).   Cardiovascular: Positive for chest pain (When coughing).  Neurological: Positive for headaches (Migraine for ~ 2 months).  All other systems reviewed and are negative.  Lab Results  Component Value Date   HGBA1C 8.1 (H) 04/23/2018   Vitals:   06/04/18 1335  BP: 114/83  Pulse: 85  Temp: 99 F (37.2 C)  SpO2: 66%    Basic Metabolic Panel BMP Latest Ref Rng & Units 04/23/2018 01/23/2018 07/27/2017  Glucose 65 - 99 mg/dL 214(H) 231(H) 165(H)  BUN 6 - 24 mg/dL 12 13 16   Creatinine 0.57 - 1.00 mg/dL 0.76 0.82 0.87  BUN/Creat Ratio 9 - 23 16 16 18   Sodium 134 - 144 mmol/L 141 139 141  Potassium 3.5 - 5.2 mmol/L 4.0 4.1 3.9  Chloride 96 - 106 mmol/L 100 100 102  CO2 20 - 29 mmol/L 21 21 23   Calcium 8.7 - 10.2 mg/dL 9.2 9.4 9.6     Lipid Panel     Component Value Date/Time   CHOL 136 04/23/2018 0932   TRIG 71 04/23/2018 0932   HDL 39 (L) 04/23/2018 0932   CHOLHDL 3.5 04/23/2018 0932   CHOLHDL 4 03/22/2017 1026   VLDL 22.2 03/22/2017 1026   LDLCALC 83 04/23/2018 0932   LDLDIRECT 98.0 08/17/2016 1555    Fasting SMBG:  7 day average: 209 (5 readings) 14 day average: 220 (8 readings)  Clinical ASCVD: No  ASCVD risk factors: age 32-75  A/P: Following discussion and approval by Dr. Derrel Nip, the following medication changes were made:   #Diabetes- Currently uncontrolled, most recent A1c 8.1% on 04/23/2018, improved from 10.0% on 01/23/2018; Goal A1c <7%. Reports adherence to medications. Control limited by poor dietary choices. Not appropriate to use GLP1/SGLT2 d/t history. Further insulin titration is warranted. Significant and constant pain from migraines could be contributing to poor sugar control. - Increase Tresiba to 34 units days. Increase by 2 units every 3 days until fasting <130. -  Continue metformin XR 1000 mg BID - Counseled patient that if migraines resolve, less insulin may be required to maintain control, and to be aware of this. - Encouraged to check SMBG more frequently, at least fastings daily and possibly 2 hour post prandial a few days a week  #Cough/chest discomfort/throat discomfort - Seen by Dr. Derrel Nip. Chest X-ray, CBC ordered for evaluation.  Written patient instructions provided.  Total time in face to face counseling 45 minutes.    Follow up in Pharmacist Clinic Visit 4 weeks.   De Hollingshead, PharmD PGY2 Ambulatory Care Pharmacy Resident Phone: [phone]   I have reviewed the above information and agree with above.   Deborra Medina, MD

## 2018-06-04 NOTE — Telephone Encounter (Signed)
Copied from [place] [id number]. Topic: Quick Communication - Rx Refill/Question >> Jun 04, 2018  2:00 PM Rayann Heman wrote: Medication:Insulin Degludec (TRESIBA) 100 UNIT/ML SOLN [[id number]]  pt called and stated that the pharmacy has not received medication. Medication directions need to be specific regarding dosage increase "Increase Tresiba to 34 units daily. Do this for 3 days, then increase by 2 units every 3 days until fasting blood sugars are consistently less than 130" pt also needs a refill of metFORMIN (GLUCOPHAGE-XR) 500 MG 24 hr tablet [[id number]]. Please advise

## 2018-06-04 NOTE — Telephone Encounter (Signed)
Resent tresiba and metformin as directed . Notified patient.

## 2018-06-04 NOTE — Patient Instructions (Addendum)
It was good to see you today.    Increase Tresiba to 34 units daily. Do this for 3 days, then increase by 2 units every 3 days until fasting blood sugars are consistently less than 130.   Continue metformin 1000 mg twice daily. Try taking this with a snack if you miss taking it with a meal.   Dr. Derrel Nip will be in touch regarding the results of the chest x ray and lab work today.   Schedule follow up with me in about 4 weeks. Please reach out with any questions or concerns.    Catie Darnelle Maffucci, PharmD

## 2018-06-04 NOTE — Assessment & Plan Note (Signed)
#  Diabetes- Currently uncontrolled, most recent A1c 8.1% on 04/23/2018, improved from 10.0% on 01/23/2018; Goal A1c <7%. Reports adherence to medications. Control limited by poor dietary choices. Not appropriate to use GLP1/SGLT2 d/t history. Further insulin titration is warranted. Significant and constant pain from migraines could be contributing to poor sugar control. - Increase Tresiba to 34 units days. Increase by 2 units every 3 days until fasting <130. - Continue metformin XR 1000 mg BID - Counseled patient that if migraines resolve, less insulin may be required to maintain control, and to be aware of this. - Encouraged to check SMBG more frequently, at least fastings daily and possibly 2 hour post prandial a few days a week

## 2018-06-04 NOTE — Assessment & Plan Note (Signed)
One day history of  Sore throat,  nonproductive cough,  Exertional fatigue/dyspnea,  Pleurisy and sick contacts (duaghter diagnosed with pneumonia recently)

## 2018-06-06 DIAGNOSIS — U071 COVID-19: Secondary | ICD-10-CM

## 2018-06-06 HISTORY — DX: COVID-19: U07.1

## 2018-06-08 ENCOUNTER — Other Ambulatory Visit: Payer: Self-pay | Admitting: Internal Medicine

## 2018-06-08 DIAGNOSIS — Z794 Long term (current) use of insulin: Secondary | ICD-10-CM

## 2018-06-08 DIAGNOSIS — E119 Type 2 diabetes mellitus without complications: Secondary | ICD-10-CM

## 2018-06-08 MED ORDER — LEVOFLOXACIN 500 MG PO TABS: 500.0000 mg | ORAL_TABLET | Freq: Every day | ORAL | 0 refills | Status: DC

## 2018-06-08 MED ORDER — BENZONATATE 200 MG PO CAPS: 200.0000 mg | ORAL_CAPSULE | Freq: Two times a day (BID) | ORAL | 0 refills | Status: DC | PRN

## 2018-06-08 MED ORDER — PREDNISONE 10 MG PO TABS: ORAL_TABLET | ORAL | 0 refills | Status: DC

## 2018-06-08 MED ORDER — INSULIN DEGLUDEC 100 UNIT/ML ~~LOC~~ SOLN: 34.0000 [IU] | Freq: Every day | SUBCUTANEOUS | 1 refills | Status: DC

## 2018-06-08 MED ORDER — METFORMIN HCL ER 500 MG PO TB24: ORAL_TABLET | ORAL | 3 refills | Status: DC

## 2018-06-08 MED FILL — predniSONE 10 MG TABS: 10 | 6 days supply | Qty: 21 | Fill #0

## 2018-06-08 MED FILL — metFORMIN HCL ER 500 MG TB2: 500 | 30 days supply | Qty: 120 | Fill #0

## 2018-06-08 MED FILL — levoFLOXacin 500 MG TABS: 500 | 7 days supply | Qty: 7 | Fill #0

## 2018-06-08 MED FILL — TRESIBA FLEXTOUCH 100 UNITS: 100 | 72 days supply | Qty: 36 | Fill #0

## 2018-06-08 MED FILL — BENZONATATE 200 MG CAPS: 200 | 10 days supply | Qty: 20 | Fill #0

## 2018-06-08 NOTE — Telephone Encounter (Signed)
Patient currently at work keeping to herself , but describes cough as harsh with thick white mucous , no fever for last  3 days but has had body aches and fever earlier in the week. Asking can something be called in.

## 2018-06-08 NOTE — Telephone Encounter (Signed)
I called pharmacy and made sure all medications at pharmacy.

## 2018-06-28 ENCOUNTER — Other Ambulatory Visit: Payer: Self-pay | Admitting: Internal Medicine

## 2018-06-28 MED FILL — METOPROLOL TARTRATE 25 MG T: 25 | 30 days supply | Qty: 15 | Fill #0

## 2018-06-28 MED FILL — EMGALITY 120 MG/ML SOAJ: 120 | 30 days supply | Qty: 1 | Fill #1

## 2018-06-28 NOTE — Telephone Encounter (Signed)
Please review for refill.  

## 2018-06-29 ENCOUNTER — Ambulatory Visit (INDEPENDENT_AMBULATORY_CARE_PROVIDER_SITE_OTHER): Payer: Self-pay | Admitting: Family Medicine

## 2018-06-29 ENCOUNTER — Other Ambulatory Visit: Payer: Self-pay

## 2018-06-29 VITALS — BP 120/85 | HR 99 | Temp 99.8°F | Resp 18 | Wt 219.0 lb

## 2018-06-29 DIAGNOSIS — R6889 Other general symptoms and signs: Secondary | ICD-10-CM

## 2018-06-29 DIAGNOSIS — R059 Cough, unspecified: Secondary | ICD-10-CM

## 2018-06-29 DIAGNOSIS — R05 Cough: Secondary | ICD-10-CM

## 2018-06-29 LAB — POCT INFLUENZA A/B
Influenza A, POC: NEGATIVE
Influenza B, POC: NEGATIVE

## 2018-06-29 MED ORDER — ALBUTEROL SULFATE HFA 108 (90 BASE) MCG/ACT IN AERS: 2.0000 | INHALATION_SPRAY | Freq: Four times a day (QID) | RESPIRATORY_TRACT | 0 refills | Status: DC | PRN

## 2018-06-29 MED ORDER — BENZONATATE 100 MG PO CAPS: 100.0000 mg | ORAL_CAPSULE | Freq: Three times a day (TID) | ORAL | 0 refills | Status: DC | PRN

## 2018-06-29 MED ORDER — OSELTAMIVIR PHOSPHATE 75 MG PO CAPS: 75.0000 mg | ORAL_CAPSULE | Freq: Two times a day (BID) | ORAL | 0 refills | Status: AC

## 2018-06-29 MED ORDER — LOSARTAN POTASSIUM 50 MG PO TABS: 50.0000 mg | ORAL_TABLET | Freq: Every day | ORAL | 2 refills | Status: DC

## 2018-06-29 MED ORDER — HYDROCHLOROTHIAZIDE 12.5 MG PO CAPS: 12.5000 mg | ORAL_CAPSULE | Freq: Every day | ORAL | 2 refills | Status: DC

## 2018-06-29 MED FILL — LOSARTAN POTASSIUM 50 MG TA: 50 | 90 days supply | Qty: 90 | Fill #0

## 2018-06-29 MED FILL — OSELTAMIVIR PHOS 75 MG CAP: 75 | 5 days supply | Qty: 10 | Fill #0

## 2018-06-29 MED FILL — BENZONATATE 100 MG CAPS: 100 | 5 days supply | Qty: 30 | Fill #0

## 2018-06-29 MED FILL — VENTOLIN HFA 90 MCG INHALER: 108 (90 BAS | 25 days supply | Qty: 18 | Fill #0

## 2018-06-29 MED FILL — HYDROCHLOROTHIAZIDE 12.5 MG: 12.5 | 90 days supply | Qty: 90 | Fill #0

## 2018-06-29 NOTE — Progress Notes (Signed)
POC

## 2018-06-29 NOTE — Patient Instructions (Signed)

## 2018-06-29 NOTE — Progress Notes (Signed)
[name] is a 48 y.o. female who presents today with 1 days of fever, body aches and chills. She has taken a fever reducer and presents with a low grade. 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 She has attempted treatments including tessalon perles for her cough this morning and this has improved her cough symptoms.  Review of Systems  Constitutional: Positive for chills, fever and malaise/fatigue.  HENT: Positive for sore throat. Negative for congestion, ear discharge, ear pain and sinus pain.   Eyes: Negative.   Respiratory: Positive for cough. Negative for sputum production and shortness of breath.   Cardiovascular: Negative.  Negative for chest pain.  Gastrointestinal: Negative for abdominal pain, diarrhea, nausea and vomiting.  Genitourinary: Negative for dysuria, frequency, hematuria and urgency.  Musculoskeletal: Negative for myalgias.  Skin: Negative.   Neurological: Negative for dizziness and headaches.  Endo/Heme/Allergies: Negative.   Psychiatric/Behavioral: Negative.     [name] has a current medication list which includes the following prescription(s): aspirin ec, atorvastatin, freestyle lite, butalbital-acetaminophen-caffeine, diazepam, diltiazem, emgality, epinephrine, glucose blood, imipramine, insulin degludec, pen needles, insulin syringes (disposable), freestyle, losartan-hydrochlorothiazide, meclizine, metformin, metoprolol succinate, metoprolol tartrate, spironolactone, vitamin d (ergocalciferol), zonisamide, albuterol, alprazolam, baclofen, benzonatate, ketorolac, levofloxacin, ondansetron, oseltamivir, oxycodone-acetaminophen, and prednisone. Also is allergic to botox [onabotulinumtoxina]; clindamycin/lincomycin; kiwi extract; maxalt [rizatriptan benzoate]; nitrous oxide; triptans; aspirin; flagyl [metronidazole]; hydrocodone-acetaminophen; latex; mango flavor; rizatriptan; vicodin [hydrocodone-acetaminophen]; flu virus vaccine;  penicillins; and tetracyclines & related.  [name]  has a past medical history of Allergy, Anemia, Anxiety, Asthma, Back pain, Biceps tendonosis of right shoulder, Diabetes mellitus (2011), Dyspnea, Fatty liver, Food allergy, Headache disorder, History of concussion, HLD (hyperlipidemia), Hypertension, IBS (irritable bowel syndrome), Infertility, female, Joint pain, Lactose intolerance, Leg edema, Migraines, Other specified disorders of thyroid, Palpitation, Post-menopausal, Seborrheic dermatitis, Shoulder impingement syndrome, right, and Vitamin D deficiency. Also  has a past surgical history that includes Hernia repair (2003); Breast surgery; Tubal ligation; Abdominal hysterectomy (2006); Left oophorectomy; Back surgery; Shoulder arthroscopy with subacromial decompression and open rotator cuff repair, open bicep tendon repair (Right, 07/14/2016); Shoulder Closed Reduction (Right, 09/08/2016); Breast excisional biopsy (Right, 2003); Breast excisional biopsy (Right, 1999); and Breast biopsy (Right, 2018).    O: Vitals:   06/29/18 1146  BP: 120/85  Pulse: 99  Resp: 18  Temp: 99.8 F (37.7 C)  SpO2: 98%     Physical Exam Vitals signs (entered room patient lying down but is AAO x3 and appropriate when examined.) reviewed.  Constitutional:      General: She is not in acute distress.    Appearance: Normal appearance. She is well-developed. She is ill-appearing. She is not toxic-appearing or diaphoretic.  HENT:     Head: Normocephalic.     Salivary Glands: Right salivary gland is not diffusely enlarged or tender. Left salivary gland is not diffusely enlarged or tender.     Right Ear: Hearing, tympanic membrane, ear canal and external ear normal. Tympanic membrane is not injected or bulging.     Left Ear: Hearing, tympanic membrane, ear canal and external ear normal. Tympanic membrane is not injected or bulging.     Nose: Congestion and rhinorrhea present.     Right Sinus: Maxillary sinus tenderness  present. No frontal sinus tenderness.     Left Sinus: Maxillary sinus tenderness present. No frontal sinus tenderness.     Mouth/Throat:     Mouth: Mucous membranes are moist.     Pharynx: Uvula midline. Posterior oropharyngeal erythema present. No pharyngeal  swelling, oropharyngeal exudate or uvula swelling.     Tonsils: No tonsillar exudate or tonsillar abscesses. Swelling: 0 on the right. 0 on the left.  Eyes:     General: Lids are normal. No allergic shiner. Neck:     Musculoskeletal: Normal range of motion and neck supple.  Cardiovascular:     Rate and Rhythm: Normal rate and regular rhythm.     Pulses: Normal pulses.     Heart sounds: Normal heart sounds.  Pulmonary:     Effort: Pulmonary effort is normal. No respiratory distress.     Breath sounds: Normal breath sounds. No decreased breath sounds, wheezing, rhonchi or rales.  Abdominal:     General: Bowel sounds are normal.     Palpations: Abdomen is soft.  Musculoskeletal: Normal range of motion.  Lymphadenopathy:     Head:     Right side of head: Tonsillar adenopathy present. No submental or submandibular adenopathy.     Left side of head: Tonsillar adenopathy present. No submental or submandibular adenopathy.     Cervical: No cervical adenopathy.  Skin:    General: Skin is warm.  Neurological:     Mental Status: She is alert and oriented to person, place, and time.  Psychiatric:        Behavior: Behavior is cooperative.      A: 1. Flu-like symptoms   2. Cough      P: 1. Flu-like symptoms Patient who is a Dietitian with known exposures, who has multiple chronic health conditions will treat based on symptoms and presentation despite negative test. Patient advised to follow up if symptoms not improved. ED precautions and flu complications reviewed patient v/u. - POCT Influenza A/B Results for orders placed or performed in visit on 06/29/18 (from the past 24 hour(s))  POCT Influenza A/B     Status: Normal    Collection Time: 06/29/18 12:06 PM  Result Value Ref Range   Influenza A, POC Negative Negative   Influenza B, POC Negative Negative    - oseltamivir (TAMIFLU) 75 MG capsule; Take 1 capsule (75 mg total) by mouth 2 (two) times daily for 5 days.  2. Cough Supportive care for cough and congestion- patient with many allergies wanting to give support with familiar single agent medications that patient has had success with in the past.  - benzonatate (TESSALON) 100 MG capsule; Take 1-2 capsules (100-200 mg total) by mouth 3 (three) times daily as needed for cough (with full glass of water). - albuterol (PROVENTIL HFA;VENTOLIN HFA) 108 (90 Base) MCG/ACT inhaler; Inhale 2 puffs into the lungs every 6 (six) hours as needed for wheezing or shortness of breath.  Other orders - diazepam (VALIUM) 2 MG tablet; Take 2 mg by mouth every 6 (six) hours as needed for anxiety.   Discussed with patient exam findings, suspected diagnosis etiology and  reviewed recommended treatment plan and follow up, including complications and indications for urgent medical follow up and evaluation. Medications including use and indications reviewed with patient. Patient provided relevant patient education on diagnosis and/or relevant related condition that were discussed and reviewed with patient at discharge. Patient verbalized understanding of information provided and agrees with plan of care (POC), all questions answered.

## 2018-07-01 ENCOUNTER — Emergency Department (INDEPENDENT_AMBULATORY_CARE_PROVIDER_SITE_OTHER): Payer: 59

## 2018-07-01 ENCOUNTER — Emergency Department
Admission: EM | Admit: 2018-07-01 | Discharge: 2018-07-01 | Disposition: A | Discharge status: Home / Self Care | Payer: 59 | Source: Home / Self Care | Attending: Emergency Medicine | Admitting: Emergency Medicine

## 2018-07-01 ENCOUNTER — Encounter: Payer: Self-pay | Admitting: Emergency Medicine

## 2018-07-01 ENCOUNTER — Telehealth: Payer: 59 | Admitting: Physician Assistant

## 2018-07-01 DIAGNOSIS — R0989 Other specified symptoms and signs involving the circulatory and respiratory systems: Secondary | ICD-10-CM

## 2018-07-01 DIAGNOSIS — J209 Acute bronchitis, unspecified: Secondary | ICD-10-CM | POA: Diagnosis not present

## 2018-07-01 DIAGNOSIS — R05 Cough: Secondary | ICD-10-CM

## 2018-07-01 DIAGNOSIS — R058 Other specified cough: Secondary | ICD-10-CM

## 2018-07-01 DIAGNOSIS — R300 Dysuria: Secondary | ICD-10-CM

## 2018-07-01 DIAGNOSIS — R071 Chest pain on breathing: Secondary | ICD-10-CM

## 2018-07-01 LAB — POCT URINALYSIS DIP (MANUAL ENTRY)
Blood, UA: NEGATIVE
Glucose, UA: 250 mg/dL — AB
Leukocytes, UA: NEGATIVE
Nitrite, UA: NEGATIVE
Protein Ur, POC: 30 mg/dL — AB
Spec Grav, UA: >=1.03 — AB (ref 1.010–1.025)
Urobilinogen, UA: 1 E.U./dL
pH, UA: 5.5 (ref 5.0–8.0)

## 2018-07-01 MED ORDER — PREDNISONE 10 MG (21) PO TBPK: ORAL_TABLET | ORAL | 0 refills | Status: DC

## 2018-07-01 MED ORDER — LEVOFLOXACIN 500 MG PO TABS: ORAL_TABLET | ORAL | 0 refills | Status: DC

## 2018-07-01 NOTE — Discharge Instructions (Addendum)
Chest x-ray today is within normal limits.  No pneumonia. Urine test shows no sign of urinary tract infection, but you have mild dehydration that can be treated with drinking plenty of fluids.  Please check your sugars frequently Printed prescriptions today: Levaquin as antibiotic for 7 days.  Prednisone tapering dose pack. Rest and drink plenty of fluids.  Tylenol if needed for pain or fever. Please follow-up with your PCP in 3 to 4 days, sooner if worse or new symptoms. Okay to use albuterol HFA, which you have at home, 2 inhalations every 4-6 hours if needed for wheezing. If you have severe worsening symptoms, go to emergency room.

## 2018-07-01 NOTE — ED Triage Notes (Signed)
Patient c/o productive cough x 3 days, fever on Friday, seen @ [facility] given Albuterol Inhaler, Tessalon Pearls and Tamiflu, fever comes and goes, having dysuria since starting the Tamiflu.

## 2018-07-01 NOTE — Progress Notes (Signed)
Based on what you shared with me it looks like you have a condition that should be evaluated in a face to face office visit. Giving chest pain and fever, you need in person assessment and chest xray to rule out pneumonia. I would recommend [facility] as they have access to all of your records.  NOTE: If you entered your credit card information for this eVisit, you will not be charged. You may see a "hold" on your card for the $30 but that hold will drop off and you will not have a charge processed.  If you are having a true medical emergency please call 911.  If you need an urgent face to face visit, [facility] has four urgent care centers for your convenience.  If you need care fast and have a high deductible or no insurance consider:   [email or web address] to reserve your spot online an avoid wait times  [facility] [address] [place], [place] [address] 8 am to 8 pm Monday-Friday 10 am to 4 pm Saturday-Sunday *Across the street from [employer]  [place], [address] 8 am to 5 pm Monday-Friday * In the [facility] on the [facility]   The following sites will take your  insurance:  . [facility] [place] a Provider at this Location  [address] [place], [place] [address] . 10 am to 8 pm Monday-Friday . 12 pm to 8 pm Saturday-Sunday   . [facility] at [place] a Provider at this Location  [address] [place], [place], [place] [address] . 8 am to 8 pm Monday-Friday . 9 am to 6 pm Saturday . 11 am to 6 pm Sunday   . [facility] at [place] Get Driving Directions  [address] [place], [place] [address] . 8 am to 8 pm Monday-Friday . 8 am to 4 pm Saturday-Sunday   Your e-visit answers were reviewed by a  board certified advanced clinical practitioner to complete your personal care plan.  Thank you for using e-Visits.

## 2018-07-01 NOTE — ED Provider Notes (Addendum)
[name] C[facility]    CSN: [id number] Arrival date & time: 07/01/18  1454  Sunday   History   Chief Complaint Chief Complaint  Patient presents with  . URI    HPI [name] is a 48 y.o. female.  Her PCP is Deborra Medina  HPI 48 year old female, past medical history of multiple medical problems below, including type 2 diabetes and mild episodic asthma.  Complains of chest congestion and cough productive of purulent sputum x 3 days, with fever, chills since Friday. Seen @ [facility] 06/29/2018, diagnosed with influenza-like illness   Given Albuterol Inhaler, Tessalon Pearls and Tamiflu,  fever comes and goes.  She does not feel the Tamiflu has helped significantly.  Temperature maximum 101.5, decreases with Tylenol. Still having occasional fever and chills.  Occasional wheezing and dyspnea with exertion.  Albuterol HFA helps the wheezing a little bit. Has decreased appetite, but denies nausea or vomiting or diarrhea.  She is tolerating liquids by mouth.  Having mild dysuria since starting the Tamiflu.  No abdominal or flank pain. Feels fatigued but denies lightheadedness or exertional chest pain or palpitations or syncope or focal neurologic symptoms. She states she checked her blood sugar 2 days ago and was 180.  Past Medical History:  Diagnosis Date  . Allergy   . Anemia   . Anxiety    claustrophobic  . Asthma   . Back pain   . Biceps tendonosis of right shoulder   . Diabetes mellitus 2011   did not start metforfin, losing weight  . Dyspnea   . Fatty liver   . Food allergy   . Headache disorder   . History of concussion   . HLD (hyperlipidemia)   . Hypertension   . IBS (irritable bowel syndrome)   . Infertility, female   . Joint pain   . Lactose intolerance   . Leg edema   . Migraines   . Other specified disorders of thyroid   . Palpitation   . Post-menopausal   . Seborrheic dermatitis   . Shoulder impingement syndrome, right   . Vitamin D  deficiency   Reviewed the above past medical history active problem list below  Patient Active Problem List   Diagnosis Date Noted  . Acute lower respiratory infection 06/04/2018  . Other fatigue 01/23/2018  . Shortness of breath on exertion 01/23/2018  . Type 2 diabetes mellitus without complication, with long-term current use of insulin ([place]) 01/23/2018  . Multinodular goiter 12/05/2017  . Multiple thyroid nodules 12/05/2017  . Microalbuminuria due to type 2 diabetes mellitus ([place]) 11/26/2017  . Pharyngitis 11/26/2017  . Encounter for preventive health examination 11/26/2017  . Abnormal TSH 11/26/2017  . Somnolence, daytime 11/14/2017  . Menopause syndrome 06/10/2017  . Adverse reaction to vaccine, sequela 03/25/2017  . Atypical chest pain 02/27/2017  . PAC (premature atrial contraction) 02/27/2017  . PVC's (premature ventricular contractions) 02/27/2017  . PSVT (paroxysmal supraventricular tachycardia) ([place]) 11/26/2016  . History of palpitations 11/08/2016  . Nontraumatic incomplete tear of right rotator cuff 07/14/2016  . AC (acromioclavicular) arthritis 07/14/2016  . Impingement syndrome of right shoulder 07/14/2016  . Fibrocystic breast changes, right 05/21/2016  . Menopausal symptoms 10/30/2015  . Insomnia 10/16/2015  . Snoring 07/26/2015  . Migraine without aura and without status migrainosus, not intractable 02/24/2015  . Muscle spasm 02/24/2015  . Facial paresthesia 02/24/2015  . Concussion with loss of consciousness 07/21/2014  . Hyperlipidemia associated with type 2 diabetes mellitus ([place]) 06/13/2014  . Pain  in joint, shoulder region 08/11/2013  . Vitamin D deficiency 05/07/2013  . Chronic migraine 11/20/2012  . S/P Total Abdominal Hysterectomy and Left Salpingo-oophorectomy 10/20/2011  . Headache disorder   . Uncontrolled type 2 diabetes mellitus ([place]) 07/20/2011  . Obesity (BMI 30-39.9) 07/20/2011  . Essential hypertension 07/20/2011    Past Surgical  History:  Procedure Laterality Date  . ABDOMINAL HYSTERECTOMY  2006   heavy menses, endometriosis, l oophrectomy  . BACK SURGERY    . BREAST BIOPSY Right 2018   benign  . BREAST EXCISIONAL BIOPSY Right 2003   benign  . BREAST EXCISIONAL BIOPSY Right 1999   benign  . BREAST SURGERY     right breast x 2 , benign  . HERNIA REPAIR  2003   left inguinal   . LEFT OOPHORECTOMY    . SHOULDER ARTHROSCOPY WITH SUBACROMIAL DECOMPRESSION AND OPEN ROTATOR C Right 07/14/2016   Procedure: RIGHT SHOULDER ARTHROSCOPY WITH SUBACROMIAL DECOMPRESSION, DISTAL CLAVICLE RESECTION AND MINI OPEN ROTATOR CUFF REPAIR, OPEN BICEP TENDODESIS;  Surgeon: Garald Balding, MD;  Location: [place];  Service: Orthopedics;  Laterality: Right;  . SHOULDER CLOSED REDUCTION Right 09/08/2016   Procedure: RIGHT CLOSED MANIPULATION SHOULDER;  Surgeon: Garald Balding, MD;  Location: [place];  Service: Orthopedics;  Laterality: Right;  . TUBAL LIGATION      OB History    Gravida  3   Para  2   Term  2   Preterm  0   AB  1   Living  0     SAB  0   TAB  0   Ectopic  1   Multiple  0   Live Births  0         No LMP recorded. Patient has had a hysterectomy.    Home Medications    Prior to Admission medications   Medication Sig Start Date End Date Taking? Authorizing Provider  albuterol (PROVENTIL HFA;VENTOLIN HFA) 108 (90 Base) MCG/ACT inhaler Inhale 2 puffs into the lungs every 6 (six) hours as needed for wheezing or shortness of breath. 06/29/18   Shella Maxim, NP  ALPRAZolam Duanne Moron) 0.5 MG tablet Take 0.5 mg by mouth 3 (three) times daily as needed for anxiety.    [provider]  aspirin EC 81 MG tablet Take 81 mg by mouth daily.    [provider]  atorvastatin (LIPITOR) 20 MG tablet TAKE 1 TABLET BY MOUTH DAILY. 01/04/18   Crecencio Mc, MD  benzonatate (TESSALON) 100 MG capsule Take 1-2 capsules (100-200 mg total) by mouth 3 (three) times  daily as needed for cough (with full glass of water). 06/29/18   Shella Maxim, NP  Blood Glucose Monitoring Suppl (FREESTYLE LITE) DEVI 1 Device by Does not apply route 2 (two) times daily. Please use [facility] if insurance covers for meter, lancets and strips 01/23/18   Beasley, Caren D, MD  butalbital-acetaminophen-caffeine (FIORICET, ESGIC) [phone] MG tablet Take 1 tablet by mouth every 6 (six) hours as needed for headache. Do not refill in less than 30 day 08/01/17   Marcial Pacas, MD  diazepam (VALIUM) 2 MG tablet Take 2 mg by mouth every 6 (six) hours as needed for anxiety.    [provider]  diltiazem (CARDIZEM LA) 240 MG 24 hr tablet TAKE 1 TABLET BY MOUTH DAILY 12/28/17   Crecencio Mc, MD  EMGALITY 120 MG/ML SOAJ INJECT 120 MG UNDER THE SKIN EVERY 30 (THIRTY) DAYS. 05/23/18  Jaclyn Prime, Collene Leyden, PA-C  EPINEPHrine 0.3 mg/0.3 mL IJ SOAJ injection Inject 0.3 mg into the muscle as needed (for allergic reaction).  07/22/16   [provider]  glucose blood (FREESTYLE TEST STRIPS) test strip Use as instructed 01/23/18   Dennard Nip D, MD  Insulin Degludec (TRESIBA) 100 UNIT/ML SOLN Inject 34 Units into the skin daily. Do this for 3 days, then increase by 2 units every 3 days until fasting blood sugars are consistently less than 130 up to  50 units: 06/08/18   Crecencio Mc, MD  Insulin Pen Needle (PEN NEEDLES) 32G X 5 MM MISC 1 pen by Does not apply route daily. 02/26/18   Crecencio Mc, MD  Insulin Syringes, Disposable, U-100 1 ML MISC Use with NPH insulin 11/24/17   Crecencio Mc, MD  ketorolac (TORADOL) 10 MG tablet Take 1 tablet (10 mg total) by mouth every 6 (six) hours as needed. Patient not taking: Reported on 06/29/2018 02/16/16   Osborne Oman, MD  Lancets (FREESTYLE) lancets Use as instructed 01/23/18   Dennard Nip D, MD  levofloxacin (LEVAQUIN) 500 MG tablet Take 1 tablet daily for 7 days. 07/01/18   Jacqulyn Cane, MD  losartan-hydrochlorothiazide [facility])  50-12.5 MG tablet TAKE 1 TABLET BY MOUTH DAILY 06/28/18   Einar Pheasant, MD  meclizine (ANTIVERT) 25 MG tablet Take 25 mg by mouth 3 (three) times daily as needed for dizziness.    [provider]  metFORMIN (GLUCOPHAGE-XR) 500 MG 24 hr tablet TAKE 2 TABLETS (1,000 MG TOTAL) BY MOUTH TWICE A DAY 06/08/18   Crecencio Mc, MD  metoprolol succinate (TOPROL-XL) 25 MG 24 hr tablet Take 12.5 mg by mouth daily.    [provider]  metoprolol tartrate (LOPRESSOR) 25 MG tablet Take 0.5 tablets (12.5 mg total) by mouth daily. Please make overdue appt for future refills. [phone]. 1st attempt. 06/28/18   End, Harrell Gave, MD  ondansetron (ZOFRAN ODT) 4 MG disintegrating tablet Take 1 tablet (4 mg total) by mouth every 8 (eight) hours as needed for nausea or vomiting. Patient not taking: Reported on 06/29/2018 06/22/17   Carrie Mew, MD  oseltamivir (TAMIFLU) 75 MG capsule Take 1 capsule (75 mg total) by mouth 2 (two) times daily for 5 days. 06/29/18 07/04/18  Shella Maxim, NP  oxyCODONE-acetaminophen (ROXICET) 5-325 MG tablet Take 1-2 tablets by mouth every 4 (four) hours as needed for severe pain. Patient not taking: Reported on 06/29/2018 09/08/16   Cherylann Ratel, PA-C  predniSONE (STERAPRED UNI-PAK 21 TAB) 10 MG (21) TBPK tablet Take tapering dosage over 6 days as directed 07/01/18   Jacqulyn Cane, MD  spironolactone (ALDACTONE) 25 MG tablet Take 1 tablet (25 mg total) by mouth daily. As needed for fluid retention 06/07/17   Crecencio Mc, MD  Vitamin D, Ergocalciferol, (DRISDOL) 50000 units CAPS capsule Take 1 capsule (50,000 Units total) by mouth every 7 (seven) days. 04/03/18   Dennard Nip D, MD  zonisamide (ZONEGRAN) 100 MG capsule Take 1 capsule (100 mg total) by mouth daily. 05/11/18   Paticia Stack, PA-C    Family History Family History  Problem Relation Age of Onset  . Diabetes Mother   . Heart disease Mother 46  . Hypertension Mother   . Hyperlipidemia Mother    . Heart failure Mother   . Stroke Mother   . Thyroid disease Mother   . Depression Mother   . Sleep apnea Mother   . Obesity Mother   .  Cancer Father 66       Lung Cancer  . Heart disease Father   . Mental illness Sister        bipolar, substance abuse,  clean 2 yrs  . Hypertension Sister   . Cancer Paternal Grandmother 16       breast cancer  . Breast cancer Paternal Grandmother   . Diabetes Maternal Grandmother   . Hypertension Maternal Grandmother   . Diabetes Son   Reviewed above family history.  Social History Social History   Tobacco Use  . Smoking status: Never Smoker  . Smokeless tobacco: Never Used  Substance Use Topics  . Alcohol use: No  . Drug use: No   She does not smoke cigarettes or use tobacco or alcohol or drugs.  Allergies   Botox [onabotulinumtoxina]; Clindamycin/lincomycin; Kiwi extract; Maxalt [rizatriptan benzoate]; Nitrous oxide; Triptans; Aspirin; Flagyl [metronidazole]; Hydrocodone-acetaminophen; Latex; Mango flavor; Rizatriptan; Vicodin [hydrocodone-acetaminophen]; Flu virus vaccine; Penicillins; and Tetracyclines & related   Review of Systems Review of Systems  Constitutional: Positive for appetite change and fever.  HENT: Negative for facial swelling, sore throat and trouble swallowing.   Eyes: Negative for discharge and visual disturbance.  Respiratory: Positive for choking, shortness of breath and wheezing.   Cardiovascular: Negative for chest pain, palpitations and leg swelling.  Gastrointestinal: Negative for abdominal pain, nausea and vomiting.  Genitourinary: Negative for flank pain, hematuria, pelvic pain and urgency.  Musculoskeletal: Positive for myalgias. Negative for neck stiffness.  Skin: Negative for rash.  Neurological: Negative for seizures and syncope.  Psychiatric/Behavioral: Negative for confusion and hallucinations.  All other systems reviewed and are negative.  Pertinent items noted in HPI and remainder of  comprehensive ROS otherwise negative.   Physical Exam Triage Vital Signs ED Triage Vitals  Enc Vitals Group     BP      Pulse      Resp      Temp      Temp src      SpO2      Weight      Height      Head Circumference      Peak Flow      Pain Score      Pain Loc      Pain Edu?      Excl. in [place]?    No data found.  Updated Vital Signs BP 124/86 (BP Location: Right Arm)   Pulse (!) 104   Temp 98.6 F (37 C) (Oral)   Ht 5\' 6"  (1.676 m)   Wt 98 kg   SpO2 98%   BMI 34.86 kg/m    Physical Exam Vitals signs and nursing note reviewed.  Constitutional:      General: She is not in acute distress.    Appearance: She is well-developed. She is ill-appearing. She is not toxic-appearing or diaphoretic.  HENT:     Head: Normocephalic and atraumatic.     Right Ear: Tympanic membrane normal.     Left Ear: Tympanic membrane normal.     Nose: Nose normal.     Mouth/Throat:     Mouth: Mucous membranes are moist.     Pharynx: No oropharyngeal exudate.  Eyes:     General: No scleral icterus.       Right eye: No discharge.        Left eye: No discharge.     Extraocular Movements: Extraocular movements intact.     Conjunctiva/sclera: Conjunctivae normal.     Pupils: Pupils are  equal, round, and reactive to light.  Neck:     Musculoskeletal: Neck supple.  Cardiovascular:     Rate and Rhythm: Normal rate and regular rhythm.     Heart sounds: Normal heart sounds.  Pulmonary:     Effort: Pulmonary effort is normal. No accessory muscle usage or respiratory distress.     Breath sounds: Wheezing (Minimal late expiratory wheeze only heard anteriorly on forced expiration.  [name]) and rhonchi present. No rales.     Comments: Diffuse rhonchi.  All lung fields.  No definite rales heard.  Breath sounds equal bilaterally Lymphadenopathy:     Cervical: No cervical adenopathy.  Skin:    General: Skin is warm and dry.     Capillary Refill: Capillary refill takes less than 2 seconds.      Findings: No rash.  Neurological:     Mental Status: She is alert and oriented to person, place, and time.    Oxygen saturation 98% on room air.    UC Treatments / Results  Labs (all labs ordered are listed, but only abnormal results are displayed) Labs Reviewed  POCT URINALYSIS DIP (MANUAL ENTRY) - Abnormal; Notable for the following components:      Result Value   Glucose, UA =250 (*)    Bilirubin, UA small (*)    Ketones, POC UA trace (5) (*)    Spec Grav, UA >=1.030 (*)    Protein Ur, POC =30 (*)    All other components within normal limits   Reviewed with patient that UA negative for blood leukocytes and nitrate, but +250 glucose, trace ketones, specific gravity 1.030 She declined checking blood sugar here today.   Gave her 8 ounces of ice water for her to drink slowly, and she was able to drink and tolerate the 8 ounces of ice water.   Rechecked vital signs:  BP 126/84, pulse 92 and regular.  Respiratory rate 16.-In my opinion, she has mild dehydration, contributing to glucosuria and trace ketones and clinically does not have signs of diabetic ketoacidosis.  She is alert.  Stable vital signs. -Explained the importance of oral rehydration with water at home.  Radiology Dg Chest 2 View  Result Date: 07/01/2018 CLINICAL DATA:  Cough and fever for 3 days. EXAM: CHEST - 2 VIEW COMPARISON:  06/04/2018 FINDINGS: Cardiomediastinal silhouette is normal. Mediastinal contours appear intact. Tortuosity of the aorta. There is no evidence of focal airspace consolidation, pleural effusion or pneumothorax. Osseous structures are without acute abnormality. Soft tissues are grossly normal. IMPRESSION: No active cardiopulmonary disease. Electronically Signed   By: Fidela Salisbury M.D.   On: 07/01/2018 16:37   Dg Chest 2 View Result Date: 07/01/2018 CLINICAL DATA:  Cough and fever for 3 days. EXAM: CHEST - 2 VIEW COMPARISON:  06/04/2018 FINDINGS: Cardiomediastinal silhouette is normal.  Mediastinal contours appear intact. Tortuosity of the aorta. There is no evidence of focal airspace consolidation, pleural effusion or pneumothorax. Osseous structures are without acute abnormality. Soft tissues are grossly normal. IMPRESSION: No active cardiopulmonary disease. Electronically Signed   By: Fidela Salisbury M.D.   On: 07/01/2018 16:37    Medications Ordered in UC Medications - No data to display  Initial Impression / Assessment and Plan / UC Course  I have reviewed the triage vital signs and the nursing notes.  Pertinent labs & imaging results that were available during my care of the patient were reviewed by me and considered in my medical decision making (see chart for details).  Chest x-ray shows no infiltrates. Diagnosis is severe acute bronchitis with mild bronchospasm, exacerbation of her mild episodic asthma.  Only rare wheezing with good air excursion and O2 saturation 98% on room air.-Discussed the above at length with patient. Treatment options discussed, as well as risks, benefits, alternatives. She declined nebulizer treatment here.  She declined injection of Depo-Medrol here in urgent care.-Reviewed her multiple antibiotic allergies. Patient voiced understanding and agreement with the following plans: Levaquin for antibiotic coverage.(She states this has worked well in the past without side effects) see discussion below in discharge instructions. Prednisone 10 mg - 6-day Dosepak as oral steroid burst.  She understands to check her blood sugars closely and follow-up within 3-4 days, and to seek medical care sooner if worse or new symptoms or if blood sugars over 250 at home.   Final Clinical Impressions(s) / UC Diagnoses   Final diagnoses:  Chest congestion  Cough productive of purulent sputum  Dysuria  Acute bronchitis with bronchospasm     Discharge Instructions     Chest x-ray today is within normal limits.  No pneumonia. Urine test shows no sign  of urinary tract infection, but you have mild dehydration that can be treated with drinking plenty of fluids.  Please check your sugars frequently Printed prescriptions today: Levaquin as antibiotic for 7 days.  Prednisone tapering dose pack. Rest and drink plenty of fluids.  Tylenol if needed for pain or fever. Please follow-up with your PCP in 3 to 4 days, sooner if worse or new symptoms. Okay to use albuterol HFA, which you have at home, 2 inhalations every 4-6 hours if needed for wheezing. If you have severe worsening symptoms, go to emergency room.    ED Prescriptions    Medication Sig Dispense Auth. Provider   levofloxacin (LEVAQUIN) 500 MG tablet Take 1 tablet daily for 7 days. 7 tablet Jacqulyn Cane, MD   predniSONE (STERAPRED UNI-PAK 21 TAB) 10 MG (21) TBPK tablet Take tapering dosage over 6 days as directed 21 tablet Jacqulyn Cane, MD     Precautions discussed. Red flags discussed. Questions invited and answered. Patient voiced understanding and agreement.  Over 45 minutes spent, greater than 50% of the time spent for counseling and coordination of care.    Controlled Substance Prescriptions [place] Controlled Substance Registry consulted?-Yes. In my opinion, the risks of cough medicine with codeine outweigh potential benefits, so cough medicine not prescribed. May use Tessalon that she has been previously prescribed, as needed for cough   Jacqulyn Cane, MD 07/02/18 [place]    Jacqulyn Cane, MD 07/02/18 [phone]

## 2018-07-02 ENCOUNTER — Ambulatory Visit: Payer: Self-pay | Admitting: Pharmacist

## 2018-07-12 ENCOUNTER — Ambulatory Visit (INDEPENDENT_AMBULATORY_CARE_PROVIDER_SITE_OTHER): Payer: 59 | Admitting: *Deleted

## 2018-07-12 VITALS — BP 118/81 | HR 70

## 2018-07-12 DIAGNOSIS — IMO0002 Reserved for concepts with insufficient information to code with codable children: Secondary | ICD-10-CM

## 2018-07-12 DIAGNOSIS — G43709 Chronic migraine without aura, not intractable, without status migrainosus: Secondary | ICD-10-CM | POA: Diagnosis not present

## 2018-07-12 MED ORDER — KETOROLAC TROMETHAMINE 30 MG/ML IJ SOLN
30.0000 mg | Freq: Once | INTRAMUSCULAR | Status: AC
  Administered 2018-07-12: 30 mg via INTRAMUSCULAR

## 2018-07-12 NOTE — Progress Notes (Signed)
I have reviewed the chart and agree with nursing staff's documentation of this patient's encounter.  Verita Schneiders, MD 07/12/2018 3:48 PM

## 2018-07-12 NOTE — Progress Notes (Signed)
Pt here today to receive a Toradol injection for headache. Per Dr Harolyn Rutherford to give 30mg  Toradol IM injection and patient to follow up with Allie Dimmer PA-C.   Toradol IM given and pt tolerated injection well.

## 2018-07-23 ENCOUNTER — Other Ambulatory Visit: Payer: Self-pay | Admitting: Internal Medicine

## 2018-07-31 ENCOUNTER — Encounter: Payer: Self-pay | Admitting: Radiology

## 2018-08-02 MED FILL — DILTIAZEM ER 240 MG TABLET: 240 | 90 days supply | Qty: 90 | Fill #0

## 2018-08-02 MED FILL — EMGALITY 120 MG/ML SOAJ: 120 | 30 days supply | Qty: 1 | Fill #2

## 2018-08-02 MED FILL — metFORMIN HCL ER 500 MG TB2: 500 | 30 days supply | Qty: 120 | Fill #1 | Status: TO

## 2018-08-06 MED FILL — BACLOFEN 10 MG TABS: 10 | 30 days supply | Qty: 90 | Fill #0

## 2018-08-20 MED FILL — DIAZEPAM 10 MG TABS: 10 | 1 days supply | Qty: 8 | Fill #0

## 2018-08-21 ENCOUNTER — Other Ambulatory Visit: Payer: Self-pay | Admitting: Internal Medicine

## 2018-08-21 ENCOUNTER — Other Ambulatory Visit: Payer: Self-pay | Admitting: Neurology

## 2018-08-21 MED FILL — UNIFINE PENTIPS 32GX5/32: 32G X 4 MM | 90 days supply | Qty: 100 | Fill #1

## 2018-08-21 MED FILL — EMGALITY 120 MG/ML SOAJ: 120 | 30 days supply | Qty: 1 | Fill #3

## 2018-08-21 MED FILL — ATORVASTATIN 20 MG TABLET: 20 | 90 days supply | Qty: 90 | Fill #0

## 2018-08-21 MED FILL — TRESIBA FLEXTOUCH 100 UNITS: 100 | 72 days supply | Qty: 36 | Fill #1

## 2018-08-21 MED FILL — UNIFINE PENTIPS 32GX5/32": 32G X 4 MM | 90 days supply | Qty: 100 | Fill #1

## 2018-08-31 MED FILL — metFORMIN HCL ER 500 MG TB2: 500 | 30 days supply | Qty: 120 | Fill #0 | Status: TO

## 2018-09-13 ENCOUNTER — Telehealth: Payer: Self-pay | Admitting: *Deleted

## 2018-09-13 NOTE — Telephone Encounter (Signed)

## 2018-09-20 ENCOUNTER — Encounter: Payer: Self-pay | Admitting: Internal Medicine

## 2018-09-20 ENCOUNTER — Telehealth (INDEPENDENT_AMBULATORY_CARE_PROVIDER_SITE_OTHER): Payer: 59 | Admitting: Internal Medicine

## 2018-09-20 ENCOUNTER — Other Ambulatory Visit: Payer: Self-pay

## 2018-09-20 VITALS — Ht 66.0 in | Wt 214.0 lb

## 2018-09-20 DIAGNOSIS — R002 Palpitations: Secondary | ICD-10-CM

## 2018-09-20 DIAGNOSIS — I471 Supraventricular tachycardia: Secondary | ICD-10-CM | POA: Diagnosis not present

## 2018-09-20 DIAGNOSIS — R0789 Other chest pain: Secondary | ICD-10-CM | POA: Diagnosis not present

## 2018-09-20 DIAGNOSIS — I10 Essential (primary) hypertension: Secondary | ICD-10-CM

## 2018-09-20 DIAGNOSIS — Z7189 Other specified counseling: Secondary | ICD-10-CM | POA: Diagnosis not present

## 2018-09-20 MED ORDER — METOPROLOL SUCCINATE ER 25 MG PO TB24: 12.5000 mg | ORAL_TABLET | Freq: Every day | ORAL | 3 refills | Status: DC

## 2018-09-20 MED FILL — METOPROLOL SUCCINATE ER 25: 25 | 90 days supply | Qty: 45 | Fill #0

## 2018-09-20 NOTE — Progress Notes (Signed)
Virtual Visit via Video Note   This visit type was conducted due to national recommendations for restrictions regarding the COVID-19 Pandemic (e.g. social distancing) in an effort to limit this patient's exposure and mitigate transmission in our community.  Due to her co-morbid illnesses, this patient is at least at moderate risk for complications without adequate follow up.  This format is felt to be most appropriate for this patient at this time.  All issues noted in this document were discussed and addressed.  A limited physical exam was performed with this format.  Please refer to the patient's chart for her consent to telehealth for [facility].   Evaluation Performed:  Follow-up visit  Date:  09/20/2018   ID:  [name], DOB 1971/01/01, MRN [id number]  Patient Location: Home Provider Location: Home  PCP:  Crecencio Mc, MD  Cardiologist:  Nelva Bush, MD  Electrophysiologist:  None   Chief Complaint:  Palpitations  History of Present Illness:    [name] is a 48 y.o. female with PSVT, hypertension, diabetes mellitus, asthma, anxiety, and multiple allergies.  We are speaking today for follow-up of her palpitations and PSVT.  I last saw [name] in 06/2017, at which time she was doing well with only 2 episodes of brief palpitations while on metoprolol.  We did not make any medication changes at that time.  Missed appointment in January because she was sick with respiratory symptoms.  She required antibiotics and systemic steroids but ultimately has recovered.  From a heart standpoint, [name] reports that she is doing fairly well.  She ran out of metoprolol a few months ago and has noted more frequent palpitations.  She was tolerating the medication well.  Her blood pressure has also climbed a little bit since coming off the metoprolol, now typically running 120-130/80-90.  She had one episode of transient chest discomfort last week during a stressful  situation.  She has otherwise been chest pain-free.  Her breathing is back to baseline.  She has not had any edema or dizziness.  The patient does not have symptoms concerning for COVID-19 infection (fever, chills, cough, or new shortness of breath).    Past Medical History:  Diagnosis Date  . Allergy   . Anemia   . Anxiety    claustrophobic  . Asthma   . Back pain   . Biceps tendonosis of right shoulder   . Diabetes mellitus 2011   did not start metforfin, losing weight  . Dyspnea   . Fatty liver   . Food allergy   . Headache disorder   . History of concussion   . HLD (hyperlipidemia)   . Hypertension   . IBS (irritable bowel syndrome)   . Infertility, female   . Joint pain   . Lactose intolerance   . Leg edema   . Migraines   . Other specified disorders of thyroid   . Palpitation   . Post-menopausal   . Seborrheic dermatitis   . Shoulder impingement syndrome, right   . Vitamin D deficiency    Past Surgical History:  Procedure Laterality Date  . ABDOMINAL HYSTERECTOMY  2006   heavy menses, endometriosis, l oophrectomy  . BACK SURGERY    . BREAST BIOPSY Right 2018   benign  . BREAST EXCISIONAL BIOPSY Right 2003   benign  . BREAST EXCISIONAL BIOPSY Right 1999   benign  . BREAST SURGERY     right breast x 2 , benign  .  HERNIA REPAIR  2003   left inguinal   . LEFT OOPHORECTOMY    . SHOULDER ARTHROSCOPY WITH SUBACROMIAL DECOMPRESSION AND OPEN ROTATOR C Right 07/14/2016   Procedure: RIGHT SHOULDER ARTHROSCOPY WITH SUBACROMIAL DECOMPRESSION, DISTAL CLAVICLE RESECTION AND MINI OPEN ROTATOR CUFF REPAIR, OPEN BICEP TENDODESIS;  Surgeon: Garald Balding, MD;  Location: [place];  Service: Orthopedics;  Laterality: Right;  . SHOULDER CLOSED REDUCTION Right 09/08/2016   Procedure: RIGHT CLOSED MANIPULATION SHOULDER;  Surgeon: Garald Balding, MD;  Location: [place];  Service: Orthopedics;  Laterality: Right;  . TUBAL LIGATION        Current Meds  Medication Sig  . albuterol (PROVENTIL HFA;VENTOLIN HFA) 108 (90 Base) MCG/ACT inhaler Inhale 2 puffs into the lungs every 6 (six) hours as needed for wheezing or shortness of breath.  . ALPRAZolam (XANAX) 0.5 MG tablet Take 0.5 mg by mouth 3 (three) times daily as needed for anxiety.  [name] aspirin EC 81 MG tablet Take 81 mg by mouth daily.  [name] atorvastatin (LIPITOR) 20 MG tablet TAKE 1 TABLET BY MOUTH DAILY.  [name] Blood Glucose Monitoring Suppl (FREESTYLE LITE) DEVI 1 Device by Does not apply route 2 (two) times daily. Please use [employer] if insurance covers for meter, lancets and strips  . butalbital-acetaminophen-caffeine (FIORICET, ESGIC) 50-325-40 MG tablet Take 1 tablet by mouth every 6 (six) hours as needed for headache. Do not refill in less than 30 day  . diazepam (VALIUM) 2 MG tablet Take 2 mg by mouth every 6 (six) hours as needed for anxiety.  [name] diltiazem (CARDIZEM LA) 240 MG 24 hr tablet TAKE 1 TABLET BY MOUTH DAILY  . EMGALITY 120 MG/ML SOAJ INJECT 120 MG UNDER THE SKIN EVERY 30 (THIRTY) DAYS.  [name] EPINEPHrine 0.3 mg/0.3 mL IJ SOAJ injection Inject 0.3 mg into the muscle as needed (for allergic reaction).   [name] glucose blood (FREESTYLE TEST STRIPS) test strip Use as instructed  . Insulin Degludec (TRESIBA) 100 UNIT/ML SOLN Inject 34 Units into the skin daily. Do this for 3 days, then increase by 2 units every 3 days until fasting blood sugars are consistently less than 130 up to  50 units:  . Insulin Pen Needle (PEN NEEDLES) 32G X 5 MM MISC 1 pen by Does not apply route daily.  . Insulin Syringes, Disposable, U-100 1 ML MISC Use with NPH insulin  . ketorolac (TORADOL) 10 MG tablet Take 1 tablet (10 mg total) by mouth every 6 (six) hours as needed.  . Lancets (FREESTYLE) lancets Use as instructed  . losartan-hydrochlorothiazide (HYZAAR) 50-12.5 MG tablet TAKE 1 TABLET BY MOUTH DAILY  . meclizine (ANTIVERT) 25 MG tablet Take 25 mg by mouth 3 (three) times daily as needed for  dizziness.  . metFORMIN (GLUCOPHAGE-XR) 500 MG 24 hr tablet TAKE 2 TABLETS (1,000 MG TOTAL) BY MOUTH TWICE A DAY  . ondansetron (ZOFRAN ODT) 4 MG disintegrating tablet Take 1 tablet (4 mg total) by mouth every 8 (eight) hours as needed for nausea or vomiting.  [name] oxyCODONE-acetaminophen (ROXICET) 5-325 MG tablet Take 1-2 tablets by mouth every 4 (four) hours as needed for severe pain.     Allergies:   Botox [onabotulinumtoxina]; Clindamycin/lincomycin; Kiwi extract; Maxalt [rizatriptan benzoate]; Nitrous oxide; Triptans; Aspirin; Contrast media [iodinated diagnostic agents]; Flagyl [metronidazole]; Hydrocodone-acetaminophen; Latex; Mango flavor; Rizatriptan; Vicodin [hydrocodone-acetaminophen]; Flu virus vaccine; Penicillins; and Tetracyclines & related   Social History   Tobacco Use  . Smoking status: Never Smoker  . Smokeless tobacco:  Never Used  Substance Use Topics  . Alcohol use: No  . Drug use: No     Family Hx: The patient's family history includes Breast cancer in her paternal grandmother; Cancer (age of onset: 21) in her father; Cancer (age of onset: 42) in her paternal grandmother; Depression in her mother; Diabetes in her maternal grandmother, mother, and son; Heart disease in her father; Heart disease (age of onset: 54) in her mother; Heart failure in her mother; Hyperlipidemia in her mother; Hypertension in her maternal grandmother, mother, and sister; Mental illness in her sister; Obesity in her mother; Sleep apnea in her mother; Stroke in her mother; Thyroid disease in her mother.  ROS:   Please see the history of present illness.   All other systems reviewed and are negative.   Prior CV studies:   The following studies were reviewed today:  EP Procedures and Devices:  14-day event monitor (01/12/17): Predominantly sinus rhythm with rare PACs and PVCs. Brief atrial run lasting 4 beats.  Non-Invasive Evaluation(s):  Coronary CTA (03/23/17): No evidence of coronary artery  disease. Normal coronary origins with right dominance. Coronary artery calcium score is 0.  TTE (01/19/17): Normal LV size with moderate LVH. LVEF 60-65% with grade 1 diastolic dysfunction. Normal RV size and function. Trivial pericardial effusion.  Labs/Other Tests and Data Reviewed:    EKG:  No ECG reviewed.  Recent Labs: 01/23/2018: TSH 0.935 04/23/2018: ALT 20; BUN 12; Creatinine, Ser 0.76; Potassium 4.0; Sodium 141 06/04/2018: Hemoglobin 13.7; Platelets 179.0   Recent Lipid Panel Lab Results  Component Value Date/Time   CHOL 136 04/23/2018 09:32 AM   TRIG 71 04/23/2018 09:32 AM   HDL 39 (L) 04/23/2018 09:32 AM   CHOLHDL 3.5 04/23/2018 09:32 AM   CHOLHDL 4 03/22/2017 10:26 AM   LDLCALC 83 04/23/2018 09:32 AM   LDLDIRECT 98.0 08/17/2016 03:55 PM    Wt Readings from Last 3 Encounters:  09/20/18 214 lb (97.1 kg)  07/01/18 216 lb (98 kg)  06/29/18 219 lb (99.3 kg)     Objective:    Vital Signs:  Ht 5\' 6"  (1.676 m)   Wt 214 lb (97.1 kg)   BMI 34.54 kg/m    Well nourished, well developed female in no acute distress.   ASSESSMENT & PLAN:    PSVT and palpitations: Symptoms have been well controlled on combination of low-dose diltiazem and metoprolol.  Unfortunately, Ms. Mutz ran out of metoprolol a few months ago and was unable to get a new prescription.  Since then, palpitations have increased in frequency.  We will plan to restart metoprolol succinate 12.5 mg daily and continue diltiazem 120 mg daily.  Atypical chest pain: A single episode of chest pain occurred during a particular stressful situation last week.  Otherwise, Ms. Bracken has been without chest pain.  Cardiac CTA in 03/2017 was normal without CAD or coronary artery calcification.  We will continue with primary prevention.  No further work-up at this time.  Hypertension: Blood pressure slightly elevated since running out of metoprolol.  We will restart metoprolol succinate 12.5 mg daily and continue the  remainder of her medications.  COVID-19 Education: The signs and symptoms of COVID-19 were discussed with the patient and how to seek care for testing (follow up with PCP or arrange E-visit).  The importance of social distancing was discussed today.  Time:   Today, I have spent 11 minutes with the patient with telehealth technology discussing the above problems.     Medication  Adjustments/Labs and Tests Ordered: Current medicines are reviewed at length with the patient today.  Concerns regarding medicines are outlined above.   Tests Ordered: None.  Medication Changes: Meds ordered this encounter  Medications  . metoprolol succinate (TOPROL-XL) 25 MG 24 hr tablet    Sig: Take 0.5 tablets (12.5 mg total) by mouth daily.    Dispense:  45 tablet    Refill:  3    Disposition:  Follow up in 1 year(s)  Signed, Nelva Bush, MD  09/20/2018 9:15 AM    [place]

## 2018-09-20 NOTE — Patient Instructions (Signed)
Medication Instructions:  Your physician recommends that you continue on your current medications as directed. Please refer to the Current Medication list given to you today.  Metoprolol Succinate 12.5mg  daily has been refilled today  If you need a refill on your cardiac medications before your next appointment, please call your pharmacy.   Lab work: None ordered If you have labs (blood work) drawn today and your tests are completely normal, you will receive your results only by: [name] MyChart Message (if you have MyChart) OR . A paper copy in the mail If you have any lab test that is abnormal or we need to change your treatment, we will call you to review the results.  Testing/Procedures: None ordered  Follow-Up: At [facility], you and your health needs are our priority.  As part of our continuing mission to provide you with exceptional heart care, we have created designated Provider Care Teams.  These Care Teams include your primary Cardiologist (physician) and Advanced Practice Providers (APPs -  Physician Assistants and Nurse Practitioners) who all work together to provide you with the care you need, when you need it. You will need a follow up appointment in 12 months.  Please call our office 2 months in advance to schedule this appointment.  You may see Nelva Bush, MD or one of the following Advanced Practice Providers on your designated Care Team:   Murray Hodgkins, NP Christell Faith, PA-C . Marrianne Mood, PA-C

## 2018-09-28 ENCOUNTER — Ambulatory Visit (INDEPENDENT_AMBULATORY_CARE_PROVIDER_SITE_OTHER): Payer: 59 | Admitting: Physician Assistant

## 2018-09-28 ENCOUNTER — Encounter: Payer: Self-pay | Admitting: Physician Assistant

## 2018-09-28 ENCOUNTER — Ambulatory Visit: Payer: Self-pay | Admitting: Internal Medicine

## 2018-09-28 ENCOUNTER — Other Ambulatory Visit: Payer: Self-pay

## 2018-09-28 DIAGNOSIS — G43709 Chronic migraine without aura, not intractable, without status migrainosus: Secondary | ICD-10-CM

## 2018-09-28 DIAGNOSIS — G43009 Migraine without aura, not intractable, without status migrainosus: Secondary | ICD-10-CM | POA: Diagnosis not present

## 2018-09-28 DIAGNOSIS — IMO0002 Reserved for concepts with insufficient information to code with codable children: Secondary | ICD-10-CM

## 2018-09-28 MED ORDER — ZONISAMIDE 100 MG PO CAPS: 100.0000 mg | ORAL_CAPSULE | Freq: Every day | ORAL | 0 refills | Status: DC

## 2018-09-28 MED ORDER — RIMEGEPANT SULFATE 75 MG PO TBDP: 1.0000 | ORAL_TABLET | Freq: Once | ORAL | 2 refills | Status: DC | PRN

## 2018-09-28 MED ORDER — ALPRAZOLAM 0.5 MG PO TABS: 0.5000 mg | ORAL_TABLET | Freq: Three times a day (TID) | ORAL | 0 refills | Status: DC | PRN

## 2018-09-28 MED ORDER — BUTALBITAL-APAP-CAFFEINE 50-325-40 MG PO TABS: 1.0000 | ORAL_TABLET | Freq: Four times a day (QID) | ORAL | 1 refills | Status: DC | PRN

## 2018-09-28 NOTE — Patient Instructions (Signed)

## 2018-09-28 NOTE — Progress Notes (Signed)
I connected with  [name] on 09/28/18 at 11:00 AM EDT by telephone and verified that I am speaking with the correct person using two identifiers.   I discussed the limitations, risks, security and privacy concerns of performing an evaluation and management service by telephone and the availability of in person appointments. I also discussed with the patient that there may be a patient responsible charge related to this service. The patient expressed understanding and agreed to proceed.  Crosby Oyster, RN 09/28/2018  11:00 AM     TELEHEALTH VIRTUAL HEADACHE VISIT ENCOUNTER NOTE  I connected with [name] on 09/28/18 at 11:00 AM EDT by telephone at home and verified that I am speaking with the correct person using two identifiers.   I discussed the limitations, risks, security and privacy concerns of performing an evaluation and management service by telephone and the availability of in person appointments. I also discussed with the patient that there may be a patient responsible charge related to this service. The patient expressed understanding and agreed to proceed.   History:  [name] is a 48 y.o. G61P2010 female being evaluated today for headache.  She has continued to have horrible time with headaches.  She has worked 120 hours in the last 2 weeks including days and nights.  She has not been to see concussion specialist.  She uses 200mg  of zonegran and wants to stay at that lower dose.  She is continuing Teaching laboratory technician.  She requests valium for her headache rescue and fioricet.   Past Medical History:  Diagnosis Date  . Allergy   . Anemia   . Anxiety    claustrophobic  . Asthma   . Back pain   . Biceps tendonosis of right shoulder   . Diabetes mellitus 2011   did not start metforfin, losing weight  . Dyspnea   . Fatty liver   . Food allergy   . Headache disorder   . History of concussion   . HLD (hyperlipidemia)   . Hypertension   . IBS (irritable bowel  syndrome)   . Infertility, female   . Joint pain   . Lactose intolerance   . Leg edema   . Migraines   . Other specified disorders of thyroid   . Palpitation   . Post-menopausal   . Seborrheic dermatitis   . Shoulder impingement syndrome, right   . Vitamin D deficiency    Past Surgical History:  Procedure Laterality Date  . ABDOMINAL HYSTERECTOMY  2006   heavy menses, endometriosis, l oophrectomy  . BACK SURGERY    . BREAST BIOPSY Right 2018   benign  . BREAST EXCISIONAL BIOPSY Right 2003   benign  . BREAST EXCISIONAL BIOPSY Right 1999   benign  . BREAST SURGERY     right breast x 2 , benign  . HERNIA REPAIR  2003   left inguinal   . LEFT OOPHORECTOMY    . SHOULDER ARTHROSCOPY WITH SUBACROMIAL DECOMPRESSION AND OPEN ROTATOR C Right 07/14/2016   Procedure: RIGHT SHOULDER ARTHROSCOPY WITH SUBACROMIAL DECOMPRESSION, DISTAL CLAVICLE RESECTION AND MINI OPEN ROTATOR CUFF REPAIR, OPEN BICEP TENDODESIS;  Surgeon: Garald Balding, MD;  Location: [place];  Service: Orthopedics;  Laterality: Right;  . SHOULDER CLOSED REDUCTION Right 09/08/2016   Procedure: RIGHT CLOSED MANIPULATION SHOULDER;  Surgeon: Garald Balding, MD;  Location: [place];  Service: Orthopedics;  Laterality: Right;  . TUBAL LIGATION     The following portions of  the patient's history were reviewed and updated as appropriate: allergies, current medications, past family history, past medical history, past social history, past surgical history and problem list.    Review of Systems:  Pertinent items noted in HPI and remainder of comprehensive ROS otherwise negative.  Physical Exam:   General:  Alert, oriented and cooperative.   Mental Status: Normal mood and affect perceived. Normal judgment and thought content.  Physical exam deferred due to nature of the encounter  Labs and Imaging No results found for this or any previous visit (from the past 336 hour(s)). No results found.     Assessment and Plan:     1. Chronic migraine   2. Migraine without aura and without status migrainosus, not intractable    Pt will continue with Zonisamide 200mg  daily with option to increase for prevention of migraine.  Pt may use Fioricet, xanax for migraine rescue.  Limit use.  No refills. Pt to trial Nurtec for acute migraine.  Directions discussed.   RTC 3 months      I discussed the assessment and treatment plan with the patient. The patient was provided an opportunity to ask questions and all were answered. The patient agreed with the plan and demonstrated an understanding of the instructions.   The patient was advised to call back or seek an in-person evaluation/go to the ED if the symptoms worsen or if the condition fails to improve as anticipated.  I provided 20 minutes of non-face-to-face time during this encounter.   Paticia Stack, PA-C [facility], [place]

## 2018-10-03 ENCOUNTER — Encounter: Payer: Self-pay | Admitting: *Deleted

## 2018-10-03 MED FILL — NURTEC 75 MG TBDP: 75 | 30 days supply | Qty: 8 | Fill #0

## 2018-10-12 ENCOUNTER — Telehealth: Payer: Self-pay | Admitting: Internal Medicine

## 2018-10-12 DIAGNOSIS — R519 Headache, unspecified: Secondary | ICD-10-CM

## 2018-10-12 DIAGNOSIS — E1165 Type 2 diabetes mellitus with hyperglycemia: Secondary | ICD-10-CM

## 2018-10-12 DIAGNOSIS — E1129 Type 2 diabetes mellitus with other diabetic kidney complication: Secondary | ICD-10-CM

## 2018-10-12 DIAGNOSIS — E119 Type 2 diabetes mellitus without complications: Secondary | ICD-10-CM

## 2018-10-12 DIAGNOSIS — Z794 Long term (current) use of insulin: Secondary | ICD-10-CM

## 2018-10-12 NOTE — Telephone Encounter (Signed)
Copied from [place] [id number]. Topic: Quick Communication - See Telephone Encounter >> Oct 12, 2018 12:49 PM Blase Mess A wrote: CRM for notification. See Telephone encounter for: 10/12/18.Patient is calling to have order placed for  A1C. Patient is requesting order to be fax- [phone] will draw the labs. Thank you

## 2018-10-12 NOTE — Telephone Encounter (Signed)
Pt is requesting a order for A1C she states she has not had one done since November and her and the pharmacist Valetta Fuller) has been tweaking her medication for her diabetes and she is curious about how she is doing, pt is not in a hurry I informed her that the provider is on vacation and she was ok with waiting until she returns.  Elif Yonts,cma

## 2018-10-17 NOTE — Telephone Encounter (Signed)
Patient needs abs AND URINE.  AL HAVE BEEN ORDERED.  FASTING NOT REQUIRED

## 2018-10-18 NOTE — Telephone Encounter (Signed)
Scheduled labs with the patient by phone.  Ashleigh Arya,cma

## 2018-10-23 ENCOUNTER — Other Ambulatory Visit (INDEPENDENT_AMBULATORY_CARE_PROVIDER_SITE_OTHER): Payer: 59

## 2018-10-23 ENCOUNTER — Other Ambulatory Visit: Payer: Self-pay

## 2018-10-23 DIAGNOSIS — R809 Proteinuria, unspecified: Secondary | ICD-10-CM

## 2018-10-23 DIAGNOSIS — E1165 Type 2 diabetes mellitus with hyperglycemia: Secondary | ICD-10-CM | POA: Diagnosis not present

## 2018-10-23 DIAGNOSIS — R7989 Other specified abnormal findings of blood chemistry: Secondary | ICD-10-CM | POA: Diagnosis not present

## 2018-10-23 DIAGNOSIS — E1129 Type 2 diabetes mellitus with other diabetic kidney complication: Secondary | ICD-10-CM | POA: Diagnosis not present

## 2018-10-23 LAB — COMPREHENSIVE METABOLIC PANEL
ALT: 16 U/L (ref 0–35)
AST: 14 U/L (ref 0–37)
Albumin: 4.2 g/dL (ref 3.5–5.2)
Alkaline Phosphatase: 127 U/L — ABNORMAL HIGH (ref 39–117)
BUN: 13 mg/dL (ref 6–23)
CO2: 24 mEq/L (ref 19–32)
Calcium: 8.9 mg/dL (ref 8.4–10.5)
Chloride: 108 mEq/L (ref 96–112)
Creatinine, Ser: 0.87 mg/dL (ref 0.40–1.20)
GFR: 83.65 mL/min (ref >60.00)
Glucose, Bld: 180 mg/dL — ABNORMAL HIGH (ref 70–99)
Potassium: 3.5 mEq/L (ref 3.5–5.1)
Sodium: 141 mEq/L (ref 135–145)
Total Bilirubin: 0.3 mg/dL (ref 0.2–1.2)
Total Protein: 6.5 g/dL (ref 6.0–8.3)

## 2018-10-23 LAB — MICROALBUMIN / CREATININE URINE RATIO
Creatinine,U: 198.5 mg/dL
Microalb Creat Ratio: 1.3 mg/g (ref 0.0–30.0)
Microalb, Ur: 2.5 mg/dL — ABNORMAL HIGH (ref 0.0–1.9)

## 2018-10-23 LAB — T3, FREE: T3, Free: 3.2 pg/mL (ref 2.3–4.2)

## 2018-10-23 LAB — HEMOGLOBIN A1C: Hgb A1c MFr Bld: 8 % — ABNORMAL HIGH (ref 4.6–6.5)

## 2018-10-23 LAB — TSH: TSH: 1.21 u[IU]/mL (ref 0.35–4.50)

## 2018-10-23 LAB — T4, FREE: Free T4: 0.72 ng/dL (ref 0.60–1.60)

## 2018-10-26 ENCOUNTER — Telehealth: Payer: Self-pay | Admitting: Internal Medicine

## 2018-10-26 DIAGNOSIS — Z794 Long term (current) use of insulin: Secondary | ICD-10-CM

## 2018-10-26 DIAGNOSIS — E119 Type 2 diabetes mellitus without complications: Secondary | ICD-10-CM

## 2018-10-26 NOTE — Telephone Encounter (Unsigned)
Copied from [place] [id number]. Topic: Quick Communication - Rx Refill/Question >> Oct 26, 2018  3:42 PM Mcneil, Ja-Kwan wrote: Medication: glucose blood (FREESTYLE TEST STRIPS) test strip and Lancets (FREESTYLE) lancets  Has the patient contacted their pharmacy? yes   Preferred Pharmacy (with phone number or street name): [facility], [place]. [phone] (Phone) [phone] (Fax)  Agent: Please be advised that RX refills may take up to 3 business days. We ask that you follow-up with your pharmacy.

## 2018-10-30 MED ORDER — FREESTYLE LANCETS MISC: 2 refills | Status: DC

## 2018-10-30 MED ORDER — GLUCOSE BLOOD VI STRP: ORAL_STRIP | 2 refills | Status: DC

## 2018-10-30 NOTE — Telephone Encounter (Signed)
Refilled

## 2018-11-05 ENCOUNTER — Encounter: Payer: Self-pay | Admitting: *Deleted

## 2018-11-05 DIAGNOSIS — S86911A Strain of unspecified muscle(s) and tendon(s) at lower leg level, right leg, initial encounter: Secondary | ICD-10-CM | POA: Diagnosis not present

## 2018-11-05 MED FILL — EMGALITY 120 MG/ML SOAJ: 120 | 30 days supply | Qty: 1 | Fill #4

## 2018-11-23 ENCOUNTER — Other Ambulatory Visit: Payer: Self-pay | Admitting: Internal Medicine

## 2018-11-23 MED FILL — UNIFINE PENTIPS 32GX5/32: 32G X 4 MM | 90 days supply | Qty: 100 | Fill #2

## 2018-11-23 MED FILL — metFORMIN HCL ER 500 MG TB2: 500 | 90 days supply | Qty: 360 | Fill #0

## 2018-11-23 MED FILL — UNIFINE PENTIPS 32GX5/32": 32G X 4 MM | 90 days supply | Qty: 100 | Fill #2

## 2018-11-23 MED FILL — ATORVASTATIN 20 MG TABLET: 20 | 90 days supply | Qty: 90 | Fill #1

## 2018-11-23 MED FILL — DILTIAZEM ER 240 MG TABLET: 240 | 90 days supply | Qty: 90 | Fill #1

## 2018-11-26 ENCOUNTER — Telehealth: Payer: Self-pay | Admitting: Pharmacist

## 2018-11-26 ENCOUNTER — Ambulatory Visit: Payer: 59 | Admitting: Pharmacist

## 2018-11-26 ENCOUNTER — Other Ambulatory Visit: Payer: Self-pay

## 2018-11-26 DIAGNOSIS — E1165 Type 2 diabetes mellitus with hyperglycemia: Secondary | ICD-10-CM

## 2018-11-26 DIAGNOSIS — E119 Type 2 diabetes mellitus without complications: Secondary | ICD-10-CM

## 2018-11-26 DIAGNOSIS — Z794 Long term (current) use of insulin: Secondary | ICD-10-CM

## 2018-11-26 MED ORDER — TRESIBA FLEXTOUCH 100 UNIT/ML ~~LOC~~ SOPN: 36.0000 [IU] | PEN_INJECTOR | Freq: Every day | SUBCUTANEOUS | 2 refills | Status: DC

## 2018-11-26 MED ORDER — JARDIANCE 10 MG PO TABS: 10.0000 mg | ORAL_TABLET | Freq: Every day | ORAL | 2 refills | Status: DC

## 2018-11-26 MED ORDER — TRESIBA 100 UNIT/ML ~~LOC~~ SOLN: 36.0000 [IU] | Freq: Every day | SUBCUTANEOUS | 2 refills | Status: DC

## 2018-11-26 MED FILL — TRESIBA FLEXTOUCH 100 UNITS: 100 | 30 days supply | Qty: 15 | Fill #0

## 2018-11-26 MED FILL — JARDIANCE 10 MG TABLET: 10 | 30 days supply | Qty: 30 | Fill #0

## 2018-11-26 NOTE — Assessment & Plan Note (Signed)
#  Diabetes - Currently uncontrolled, most recent A1c 8.0% on 10/23/2018. Goal A1c <7%. Discussed increasing basal insulin vs bolus with largest meal of the day vs trial of SGLT2. Patient elected to start SGLT2.  - Start Jardiance 10 mg daily. Counseled to take in the morning, focus on remaining well hydrated to prevent genitourinary symptoms.  - Continue metformin XR 1000 mg BID. Continue Tresiba 36 units daily.  - Educated that stress and pain from migraines are likely contributing to poor sugar control. Encouraged to focus on stress reduction to prevent migraines as best she can, to help improve diabetes management.

## 2018-11-26 NOTE — Telephone Encounter (Signed)
S:     Chief Complaint  Patient presents with  . Medication Management    Diabetes    Patient contacted telephonically for diabetes evaluation, education, and management at the request of Dr. Derrel Nip (referred in nurse triage phone call on 02/14/2018). 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 She has not followed up with neurology clinic or concussion clinic as suggested by her OBGYN.   She notes that she self-titrated to 36 units of Tresiba, but when she tried 38 units, she developed symptoms of "feeling sick". However, this correlated with a respiratory infection requiring prednisone, which she notes decreases her appetite and she wasn't eating much at the time. She hasn't been eating much lately d/t long hours at work and the stress of her job.  Insurance coverage/medication affordability: [employer] Employee Plan   Patient reports adherence with medications.  Current diabetes medications include: Tresiba 36 units daily, metformin XR 2000 mg daily - Prefers to avoid GLP1s d/t hx thyroid nodule coinciding with use Current hypertension medications include: losartan-HCTZ 50-12.5 mg daily, metoprolol succinate 12.5 mg daily, diltiazem 240 mg daily Current hyperlipidemic medications include: atorvastatin 20 mg daily   Patient reports hypoglycemic s/sx including dizziness, shakiness, sweating if she doesn't eat much OR when she increased to 38 units daily. Patient denies hyperglycemic symptoms including polyuria, polydipsia, polyphagia, nocturia, neuropathy, blurred vision.  Exercise - Fasting walking 3-4 miles 3-4 days a week; but then sprained her ankle   O:   Lab Results  Component Value Date   HGBA1C 8.0 (H) 40/81/4481    Basic Metabolic Panel BMP Latest Ref Rng & Units 10/23/2018 04/23/2018 01/23/2018   Glucose 70 - 99 mg/dL 180(H) 214(H) 231(H)  BUN 6 - 23 mg/dL 13 12 13   Creatinine 0.40 - 1.20 mg/dL 0.87 0.76 0.82  BUN/Creat Ratio 9 - 23 - 16 16  Sodium 135 - 145 mEq/L 141 141 139  Potassium 3.5 - 5.1 mEq/L 3.5 4.0 4.1  Chloride 96 - 112 mEq/L 108 100 100  CO2 19 - 32 mEq/L 24 21 21   Calcium 8.4 - 10.5 mg/dL 8.9 9.2 9.4     Lipid Panel     Component Value Date/Time   CHOL 136 04/23/2018 0932   TRIG 71 04/23/2018 0932   HDL 39 (L) 04/23/2018 0932   CHOLHDL 3.5 04/23/2018 0932   CHOLHDL 4 03/22/2017 1026   VLDL 22.2 03/22/2017 1026   LDLCALC 83 04/23/2018 0932   LDLDIRECT 98.0 08/17/2016 1555    Self-Monitored Blood Glucose Results Fasting SMBG: 140-150s  Clinical ASCVD: No   A/P: #Diabetes - Currently uncontrolled, most recent A1c 8.0% on 10/23/2018. Goal A1c <7%. Discussed increasing basal insulin vs bolus with largest meal of the day vs trial of SGLT2. Patient elected to start SGLT2.  - Start Jardiance 10 mg daily. Counseled to take in the morning, focus on remaining well hydrated to prevent genitourinary symptoms.  - Continue metformin XR 1000 mg BID. Continue Tresiba 36 units daily.  - Educated that stress and pain from migraines are likely contributing to poor sugar control. Encouraged to focus on stress reduction to prevent migraines as best she can, to help improve diabetes management.   Follow up in Pharmacist Clinic Visit TBD, follow up with PCP in 1 month.  De Hollingshead, PharmD, [place] PGY2 Ambulatory Care Pharmacy Resident  Phone: [phone]

## 2018-11-26 NOTE — Addendum Note (Signed)
Addended by: De Hollingshead on: 11/26/2018 09:19 AM   Modules accepted: Orders

## 2018-11-27 DIAGNOSIS — S93422A Sprain of deltoid ligament of left ankle, initial encounter: Secondary | ICD-10-CM | POA: Diagnosis not present

## 2018-11-27 DIAGNOSIS — S86911A Strain of unspecified muscle(s) and tendon(s) at lower leg level, right leg, initial encounter: Secondary | ICD-10-CM | POA: Diagnosis not present

## 2018-11-27 DIAGNOSIS — S93491A Sprain of other ligament of right ankle, initial encounter: Secondary | ICD-10-CM | POA: Diagnosis not present

## 2018-11-27 DIAGNOSIS — S93421A Sprain of deltoid ligament of right ankle, initial encounter: Secondary | ICD-10-CM | POA: Diagnosis not present

## 2018-11-27 DIAGNOSIS — S93492A Sprain of other ligament of left ankle, initial encounter: Secondary | ICD-10-CM | POA: Diagnosis not present

## 2018-11-28 NOTE — Telephone Encounter (Signed)
Pt is scheduled for a 1 month follow up. Pt is aware of appt date and time.

## 2018-11-29 MED FILL — NURTEC 75 MG TBDP: 75 | 30 days supply | Qty: 8 | Fill #0

## 2018-12-12 MED FILL — ACCU-CHEK GUIDE STRP: 90 days supply | Qty: 100 | Fill #0

## 2018-12-20 ENCOUNTER — Encounter: Payer: Self-pay | Admitting: Radiology

## 2018-12-21 MED FILL — ACCU-CHEK GUIDE STRP: 90 days supply | Qty: 100 | Fill #0

## 2018-12-26 ENCOUNTER — Encounter: Payer: 59 | Admitting: Family

## 2018-12-26 ENCOUNTER — Telehealth: Payer: 59 | Admitting: Nurse Practitioner

## 2018-12-26 DIAGNOSIS — B373 Candidiasis of vulva and vagina: Secondary | ICD-10-CM

## 2018-12-26 DIAGNOSIS — B3731 Acute candidiasis of vulva and vagina: Secondary | ICD-10-CM

## 2018-12-26 MED ORDER — FLUCONAZOLE 150 MG PO TABS: 150.0000 mg | ORAL_TABLET | ORAL | 0 refills | Status: DC | PRN

## 2018-12-26 MED FILL — FLUCONAZOLE 150 MG TABS: 150 | 9 days supply | Qty: 3 | Fill #0

## 2018-12-26 NOTE — Progress Notes (Signed)
I sent in your script to your pharmacy.

## 2018-12-26 NOTE — Progress Notes (Signed)
We are sorry that you are not feeling well. Here is how we plan to help! Based on what you shared with me it looks like you: May have a yeast vaginosis  Vaginosis is an inflammation of the vagina that can result in discharge, itching and pain. The cause is usually a change in the normal balance of vaginal bacteria or an infection. Vaginosis can also result from reduced estrogen levels after menopause.  The most common causes of vaginosis are:   Bacterial vaginosis which results from an overgrowth of one on several organisms that are normally present in your vagina.   Yeast infections which are caused by a naturally occurring fungus called candida.   Vaginal atrophy (atrophic vaginosis) which results from the thinning of the vagina from reduced estrogen levels after menopause.   Trichomoniasis which is caused by a parasite and is commonly transmitted by sexual intercourse.  Factors that increase your risk of developing vaginosis include: . Medications, such as antibiotics and steroids . Uncontrolled diabetes . Use of hygiene products such as bubble bath, vaginal spray or vaginal deodorant . Douching . Wearing damp or tight-fitting clothing . Using an intrauterine device (IUD) for birth control . Hormonal changes, such as those associated with pregnancy, birth control pills or menopause . Sexual activity . Having a sexually transmitted infection  Your treatment plan is A single Diflucan (fluconazole) 150mg tablet once.  I have electronically sent this prescription into the pharmacy that you have chosen.  Be sure to take all of the medication as directed. Stop taking any medication if you develop a rash, tongue swelling or shortness of breath. Mothers who are breast feeding should consider pumping and discarding their breast milk while on these antibiotics. However, there is no consensus that infant exposure at these doses would be harmful.  Remember that medication creams can weaken latex  condoms. .   HOME CARE:  Good hygiene may prevent some types of vaginosis from recurring and may relieve some symptoms:  . Avoid baths, hot tubs and whirlpool spas. Rinse soap from your outer genital area after a shower, and dry the area well to prevent irritation. Don't use scented or harsh soaps, such as those with deodorant or antibacterial action. . Avoid irritants. These include scented tampons and pads. . Wipe from front to back after using the toilet. Doing so avoids spreading fecal bacteria to your vagina.  Other things that may help prevent vaginosis include:  . Don't douche. Your vagina doesn't require cleansing other than normal bathing. Repetitive douching disrupts the normal organisms that reside in the vagina and can actually increase your risk of vaginal infection. Douching won't clear up a vaginal infection. . Use a latex condom. Both female and female latex condoms may help you avoid infections spread by sexual contact. . Wear cotton underwear. Also wear pantyhose with a cotton crotch. If you feel comfortable without it, skip wearing underwear to bed. Yeast thrives in moist environments Your symptoms should improve in the next day or two.  GET HELP RIGHT AWAY IF:  . You have pain in your lower abdomen ( pelvic area or over your ovaries) . You develop nausea or vomiting . You develop a fever . Your discharge changes or worsens . You have persistent pain with intercourse . You develop shortness of breath, a rapid pulse, or you faint.  These symptoms could be signs of problems or infections that need to be evaluated by a medical provider now.  MAKE SURE YOU      Understand these instructions.  Will watch your condition.  Will get help right away if you are not doing well or get worse.  Your e-visit answers were reviewed by a board certified advanced clinical practitioner to complete your personal care plan. Depending upon the condition, your plan could have included  both over the counter or prescription medications. Please review your pharmacy choice to make sure that you have choses a pharmacy that is open for you to pick up any needed prescription, Your safety is important to us. If you have drug allergies check your prescription carefully.   You can use MyChart to ask questions about today's visit, request a non-urgent call back, or ask for a work or school excuse for 24 hours related to this e-Visit. If it has been greater than 24 hours you will need to follow up with your provider, or enter a new e-Visit to address those concerns. You will get a MyChart message within the next two days asking about your experience. I hope that your e-visit has been valuable and will speed your recovery.  5-10 minutes spent reviewing and documenting in chart.  

## 2018-12-31 ENCOUNTER — Ambulatory Visit: Payer: 59 | Admitting: Internal Medicine

## 2019-01-04 ENCOUNTER — Encounter: Payer: Self-pay | Admitting: Internal Medicine

## 2019-01-04 ENCOUNTER — Other Ambulatory Visit: Payer: Self-pay

## 2019-01-04 ENCOUNTER — Ambulatory Visit (INDEPENDENT_AMBULATORY_CARE_PROVIDER_SITE_OTHER): Payer: 59 | Admitting: Internal Medicine

## 2019-01-04 DIAGNOSIS — E119 Type 2 diabetes mellitus without complications: Secondary | ICD-10-CM | POA: Diagnosis not present

## 2019-01-04 DIAGNOSIS — Z794 Long term (current) use of insulin: Secondary | ICD-10-CM | POA: Diagnosis not present

## 2019-01-04 DIAGNOSIS — E1165 Type 2 diabetes mellitus with hyperglycemia: Secondary | ICD-10-CM | POA: Diagnosis not present

## 2019-01-04 MED ORDER — GLIPIZIDE 5 MG PO TABS: 5.0000 mg | ORAL_TABLET | Freq: Two times a day (BID) | ORAL | 3 refills | Status: DC

## 2019-01-04 MED ORDER — METFORMIN HCL ER 500 MG PO TB24: 1000.0000 mg | ORAL_TABLET | Freq: Every day | ORAL | 3 refills | Status: DC

## 2019-01-04 MED FILL — glipiZIDE 5 MG TABS: 5 | 90 days supply | Qty: 180 | Fill #0

## 2019-01-04 NOTE — Progress Notes (Signed)
Virtual Visit vis [email or web address] Note  This visit type was conducted due to national recommendations for restrictions regarding the COVID-19 pandemic (e.g. social distancing).  This format is felt to be most appropriate for this patient at this time.  All issues noted in this document were discussed and addressed.  No physical exam was performed (except for noted visual exam findings with Video Visits).   I connected with@ on 01/06/19 at  4:30 PM EDT by a video enabled telemedicine application  and verified that I am speaking with the correct person using two identifiers. Location patient: work/car Location provider: work office Persons participating in the virtual visit: patient, provider  I discussed the limitations, risks, security and privacy concerns of performing an evaluation and management service by telephone and the availability of in person appointments. I also discussed with the patient that there may be a patient responsible charge related to this service. The patient expressed understanding and agreed to proceed.  Reason for visit: diabetes follow up  HPI:  48 yr old female with type 2 DM managed with Tresiba 36 units daily metformin xr 2000 mg daily in divided doses and, until recently,  Jardiance.  Developed vaginitis (candida) requiring two E visits before fluconazole was sent to pharmacy ,  She doe snot want to resume jardiance if there are alternatives  Gets nauseated and hypoglycemic if she increases Tresiba beyond 36 units. Cannot take metformin xr consistently because she gets diarrhea  .    ROS: See pertinent positives and negatives per HPI.  Past Medical History:  Diagnosis Date  . Allergy   . Anemia   . Anxiety    claustrophobic  . Asthma   . Back pain   . Biceps tendonosis of right shoulder   . Diabetes mellitus 2011   did not start metforfin, losing weight  . Dyspnea   . Fatty liver   . Food allergy   . Headache disorder   . History of concussion   . HLD  (hyperlipidemia)   . Hypertension   . IBS (irritable bowel syndrome)   . Infertility, female   . Joint pain   . Lactose intolerance   . Leg edema   . Migraines   . Other specified disorders of thyroid   . Palpitation   . Post-menopausal   . Seborrheic dermatitis   . Shoulder impingement syndrome, right   . Vitamin D deficiency     Past Surgical History:  Procedure Laterality Date  . ABDOMINAL HYSTERECTOMY  2006   heavy menses, endometriosis, l oophrectomy  . BACK SURGERY    . BREAST BIOPSY Right 2018   benign  . BREAST EXCISIONAL BIOPSY Right 2003   benign  . BREAST EXCISIONAL BIOPSY Right 1999   benign  . BREAST SURGERY     right breast x 2 , benign  . HERNIA REPAIR  2003   left inguinal   . LEFT OOPHORECTOMY    . SHOULDER ARTHROSCOPY WITH SUBACROMIAL DECOMPRESSION AND OPEN ROTATOR C Right 07/14/2016   Procedure: RIGHT SHOULDER ARTHROSCOPY WITH SUBACROMIAL DECOMPRESSION, DISTAL CLAVICLE RESECTION AND MINI OPEN ROTATOR CUFF REPAIR, OPEN BICEP TENDODESIS;  Surgeon: Garald Balding, MD;  Location: [place];  Service: Orthopedics;  Laterality: Right;  . SHOULDER CLOSED REDUCTION Right 09/08/2016   Procedure: RIGHT CLOSED MANIPULATION SHOULDER;  Surgeon: Garald Balding, MD;  Location: [place];  Service: Orthopedics;  Laterality: Right;  . TUBAL LIGATION      Family History  Problem  Relation Age of Onset  . Diabetes Mother   . Heart disease Mother 23  . Hypertension Mother   . Hyperlipidemia Mother   . Heart failure Mother   . Stroke Mother   . Thyroid disease Mother   . Depression Mother   . Sleep apnea Mother   . Obesity Mother   . Cancer Father 17       Lung Cancer  . Heart disease Father   . Mental illness Sister        bipolar, substance abuse,  clean 2 yrs  . Hypertension Sister   . Cancer Paternal Grandmother 69       breast cancer  . Breast cancer Paternal Grandmother   . Diabetes Maternal Grandmother   . Hypertension  Maternal Grandmother   . Diabetes Son     SOCIAL HX:  reports that she has never smoked. She has never used smokeless tobacco. She reports that she does not drink alcohol or use drugs.   Current Outpatient Medications:  .  albuterol (PROVENTIL HFA;VENTOLIN HFA) 108 (90 Base) MCG/ACT inhaler, Inhale 2 puffs into the lungs every 6 (six) hours as needed for wheezing or shortness of breath., Disp: 1 Inhaler, Rfl: 0 .  ALPRAZolam (XANAX) 0.5 MG tablet, Take 1 tablet (0.5 mg total) by mouth 3 (three) times daily as needed for anxiety., Disp: 20 tablet, Rfl: 0 .  aspirin EC 81 MG tablet, Take 81 mg by mouth daily., Disp: , Rfl:  .  atorvastatin (LIPITOR) 20 MG tablet, TAKE 1 TABLET BY MOUTH DAILY., Disp: 90 tablet, Rfl: 1 .  Blood Glucose Monitoring Suppl (FREESTYLE LITE) DEVI, 1 Device by Does not apply route 2 (two) times daily. Please use [facility] if insurance covers for meter, lancets and strips, Disp: 1 each, Rfl: 0 .  butalbital-acetaminophen-caffeine (FIORICET) 50-325-40 MG tablet, Take 1 tablet by mouth every 6 (six) hours as needed for headache. Do not refill in less than 30 day, Disp: 30 tablet, Rfl: 1 .  diazepam (VALIUM) 2 MG tablet, Take 2 mg by mouth every 6 (six) hours as needed for anxiety., Disp: , Rfl:  .  diltiazem (CARDIZEM LA) 240 MG 24 hr tablet, TAKE 1 TABLET BY MOUTH DAILY, Disp: 90 tablet, Rfl: 1 .  EMGALITY 120 MG/ML SOAJ, INJECT 120 MG UNDER THE SKIN EVERY 30 (THIRTY) DAYS., Disp: 1 mL, Rfl: 10 .  EPINEPHrine 0.3 mg/0.3 mL IJ SOAJ injection, Inject 0.3 mg into the muscle as needed (for allergic reaction). , Disp: , Rfl: 3 .  glucose blood (FREESTYLE TEST STRIPS) test strip, Use as instructed, Disp: 100 each, Rfl: 2 .  insulin degludec (TRESIBA FLEXTOUCH) 100 UNIT/ML SOPN FlexTouch Pen, Inject 0.36 mLs (36 Units total) into the skin daily. Increase as instructed. Max daily dose 50 units., Disp: 15 mL, Rfl: 2 .  Insulin Pen Needle (PEN NEEDLES) 32G X 5 MM MISC, 1 pen by Does  not apply route daily., Disp: 100 each, Rfl: 3 .  ketorolac (TORADOL) 10 MG tablet, Take 1 tablet (10 mg total) by mouth every 6 (six) hours as needed., Disp: 30 tablet, Rfl: 1 .  Lancets (FREESTYLE) lancets, Use as instructed, Disp: 100 each, Rfl: 2 .  losartan-hydrochlorothiazide (HYZAAR) 50-12.5 MG tablet, TAKE 1 TABLET BY MOUTH DAILY, Disp: 90 tablet, Rfl: 2 .  meclizine (ANTIVERT) 25 MG tablet, Take 25 mg by mouth 3 (three) times daily as needed for dizziness., Disp: , Rfl:  .  metFORMIN (GLUCOPHAGE-XR) 500 MG 24 hr tablet, Take  2 tablets (1,000 mg total) by mouth daily with breakfast., Disp: 180 tablet, Rfl: 3 .  metoprolol succinate (TOPROL-XL) 25 MG 24 hr tablet, Take 0.5 tablets (12.5 mg total) by mouth daily., Disp: 45 tablet, Rfl: 3 .  ondansetron (ZOFRAN ODT) 4 MG disintegrating tablet, Take 1 tablet (4 mg total) by mouth every 8 (eight) hours as needed for nausea or vomiting., Disp: 20 tablet, Rfl: 0 .  oxyCODONE-acetaminophen (ROXICET) 5-325 MG tablet, Take 1-2 tablets by mouth every 4 (four) hours as needed for severe pain., Disp: 40 tablet, Rfl: 0 .  glipiZIDE (GLUCOTROL) 5 MG tablet, Take 1 tablet (5 mg total) by mouth 2 (two) times daily before a meal., Disp: 180 tablet, Rfl: 3 .  zonisamide (ZONEGRAN) 100 MG capsule, Take 1 capsule (100 mg total) by mouth daily. (Patient not taking: Reported on 01/04/2019), Disp: 540 capsule, Rfl: 0  EXAM:  VITALS per patient if applicable:  GENERAL: alert, oriented, appears well and in no acute distress  HEENT: atraumatic, conjunttiva clear, no obvious abnormalities on inspection of external nose and ears  NECK: normal movements of the head and neck  LUNGS: on inspection no signs of respiratory distress, breathing rate appears normal, no obvious gross SOB, gasping or wheezing  CV: no obvious cyanosis  MS: moves all visible extremities without noticeable abnormality  PSYCH/NEURO: pleasant and cooperative, no obvious depression or  anxiety, speech and thought processing grossly intact  ASSESSMENT AND PLAN:  Uncontrolled type 2 diabetes mellitus ([place]) stopping jardiance due to patient preference seconday to recurrent vaginitis.  adding glipizide 5 mg bid ac ,  Continue Tresiba at doses 35 units or less and continue metformin at 1000 mg daily given increased side effects at higher dose    I discussed the assessment and treatment plan with the patient. The patient was provided an opportunity to ask questions and all were answered. The patient agreed with the plan and demonstrated an understanding of the instructions.   The patient was advised to call back or seek an in-person evaluation if the symptoms worsen or if the condition fails to improve as anticipated.  I provided 25 minutes of non-face-to-face time during this encounter.   Crecencio Mc, MD

## 2019-01-04 NOTE — Patient Instructions (Signed)
Continue to suspend jardiance for now  Reduce metformin to 1000 mg daily (total dose)  Add glipizide 5 mg twice daily BEFORE MEALS  CONTINUE Tresiba 36 units  Follow up in one month

## 2019-01-04 NOTE — Progress Notes (Signed)
Pt stated that she stopped taking the jardiance a week ago because it gave her a severe yeast infection.

## 2019-01-06 NOTE — Assessment & Plan Note (Signed)
stopping jardiance due to patient preference seconday to recurrent vaginitis.  adding glipizide 5 mg bid ac ,  Continue Tresiba at doses 35 units or less and continue metformin at 1000 mg daily given increased side effects at higher dose

## 2019-01-21 MED FILL — METOPROLOL SUCCINATE ER 25: 25 | 90 days supply | Qty: 45 | Fill #0

## 2019-01-24 MED FILL — glipiZIDE 5 MG TABS: 5 | 90 days supply | Qty: 180 | Fill #0

## 2019-01-29 ENCOUNTER — Encounter (HOSPITAL_COMMUNITY): Payer: Self-pay

## 2019-01-29 ENCOUNTER — Other Ambulatory Visit: Payer: Self-pay

## 2019-01-29 ENCOUNTER — Ambulatory Visit (INDEPENDENT_AMBULATORY_CARE_PROVIDER_SITE_OTHER)
Admission: RE | Admit: 2019-01-29 | Discharge: 2019-01-29 | Disposition: A | Discharge status: Home / Self Care | Payer: 59 | Source: Ambulatory Visit

## 2019-01-29 DIAGNOSIS — T7840XA Allergy, unspecified, initial encounter: Secondary | ICD-10-CM | POA: Diagnosis not present

## 2019-01-29 DIAGNOSIS — Z794 Long term (current) use of insulin: Secondary | ICD-10-CM

## 2019-01-29 DIAGNOSIS — E119 Type 2 diabetes mellitus without complications: Secondary | ICD-10-CM

## 2019-01-29 MED ORDER — METFORMIN HCL ER 500 MG PO TB24: 500.0000 mg | ORAL_TABLET | Freq: Every day | ORAL | 3 refills | Status: DC

## 2019-01-29 MED ORDER — PREDNISONE 10 MG PO TABS: 40.0000 mg | ORAL_TABLET | Freq: Every day | ORAL | 0 refills | Status: AC

## 2019-01-29 NOTE — Discharge Instructions (Signed)
Take one dose of prednisone today Repeat tomorrow if needed Continue the benadryl and try some Pepcid.  If your symptoms worsen you need to go to the ER.

## 2019-01-29 NOTE — ED Provider Notes (Signed)
Virtual Visit via Video Note:  [name]  initiated request for Telemedicine visit with [facility] team. I connected with [name]  on 01/31/2019 at 3:57 PM  for a synchronized telemedicine visit using a video enabled HIPPA compliant telemedicine application. I verified that I am speaking with [name]  using two identifiers. Orvan July, NP  was physically located in a [facility]gent care site and [name] was located at a different location.   The limitations of evaluation and management by telemedicine as well as the availability of in-person appointments were discussed. Patient was informed that she  may incur a bill ( including co-pay) for this virtual visit encounter. [name]  expressed understanding and gave verbal consent to proceed with virtual visit.     History of Present Illness:[name]  is a 48 y.o. female presents with allergic reaction. She is unsure is it is from the grapes that she ate or a new migraine medication that  She started. This medication is [place].  Reporting that she has felt like she had a lump in her throat since yesterday.  She has taking 100 mg of Benadryl in last 24 hours.  Reports that the reaction has not progressed.  Just the sensation of a lump in her throat.  No trouble swallowing or breathing.  No tongue swelling or lip swelling.  She did use her EpiPen when the reaction started.  No chest pain, shortness of breath.  Has been to the ER many times in the past and giving steroid treatment.  Is requesting this.  She does not want to go to the ER due to [place].  Past Medical History:  Diagnosis Date  . Allergy   . Anemia   . Anxiety    claustrophobic  . Asthma   . Back pain   . Biceps tendonosis of right shoulder   . Diabetes mellitus 2011   did not start metforfin, losing weight  . Dyspnea   . Fatty liver   . Food allergy   . Headache disorder   . History of concussion   . HLD  (hyperlipidemia)   . Hypertension   . IBS (irritable bowel syndrome)   . Infertility, female   . Joint pain   . Lactose intolerance   . Leg edema   . Migraines   . Other specified disorders of thyroid   . Palpitation   . Post-menopausal   . Seborrheic dermatitis   . Shoulder impingement syndrome, right   . Vitamin D deficiency           Observations/Objective:VITALS: Per patient if applicable, see vitals. GENERAL: Alert, appears well and in no acute distress. HEENT: Atraumatic, conjunctiva clear, no obvious abnormalities on inspection of external nose and ears. NECK: Normal movements of the head and neck. CARDIOPULMONARY: No increased WOB. Speaking in clear sentences. I:E ratio WNL.  MS: Moves all visible extremities without noticeable abnormality. PSYCH: Pleasant and cooperative, well-groomed. Speech normal rate and rhythm. Affect is appropriate. Insight and judgement are appropriate. Attention is focused, linear, and appropriate.  NEURO: CN grossly intact. Oriented as arrived to appointment on time with no prompting. Moves both UE equally.  SKIN: No obvious lesions, wounds, erythema, or cyanosis noted on face or hands.     Assessment and Plan: I feel it would be appropriate to go ahead and give patient dose of steroid.  We will send 60 mg of prednisone to pharmacy  x2.  Instructed that she can do 1 dose today and if still having symptoms do 1 dose tomorrow.  She can continue the Benadryl and recommended getting some Pepcid from the pharmacy.   Follow Up Instructions: For any continued or worsening symptoms to include worsening oral swelling, trouble swallowing or breathing she will need to go straight to the ER. Patient understanding and agree.    I discussed the assessment and treatment plan with the patient. The patient was provided an opportunity to ask questions and all were answered. The patient agreed with the plan and demonstrated an understanding of the instructions.    The patient was advised to call back or seek an in-person evaluation if the symptoms worsen or if the condition fails to improve as anticipated.            Loura Halt A, NP 01/31/19 [id number]

## 2019-02-20 ENCOUNTER — Other Ambulatory Visit: Payer: Self-pay | Admitting: Internal Medicine

## 2019-02-20 MED FILL — DILTIAZEM ER 240 MG TABLET: 240 | 90 days supply | Qty: 90 | Fill #0

## 2019-02-20 MED FILL — LOSARTAN POTASSIUM 50 MG TA: 50 | 90 days supply | Qty: 90 | Fill #0

## 2019-02-20 MED FILL — HYDROCHLOROTHIAZIDE 12.5 MG: 12.5 | 90 days supply | Qty: 90 | Fill #0

## 2019-02-20 MED FILL — TRESIBA FLEXTOUCH 100 UNITS: 100 | 30 days supply | Qty: 15 | Fill #1

## 2019-02-21 IMAGING — MG DIGITAL SCREENING BILATERAL MAMMOGRAM WITH TOMO AND CAD
8 series · 8 of 24 positions shown · non-contrast
Comparison: Previous exam(s).

CLINICAL DATA: Screening.

EXAM:
DIGITAL SCREENING BILATERAL MAMMOGRAM WITH TOMO AND CAD

[L MLO synth-2D]
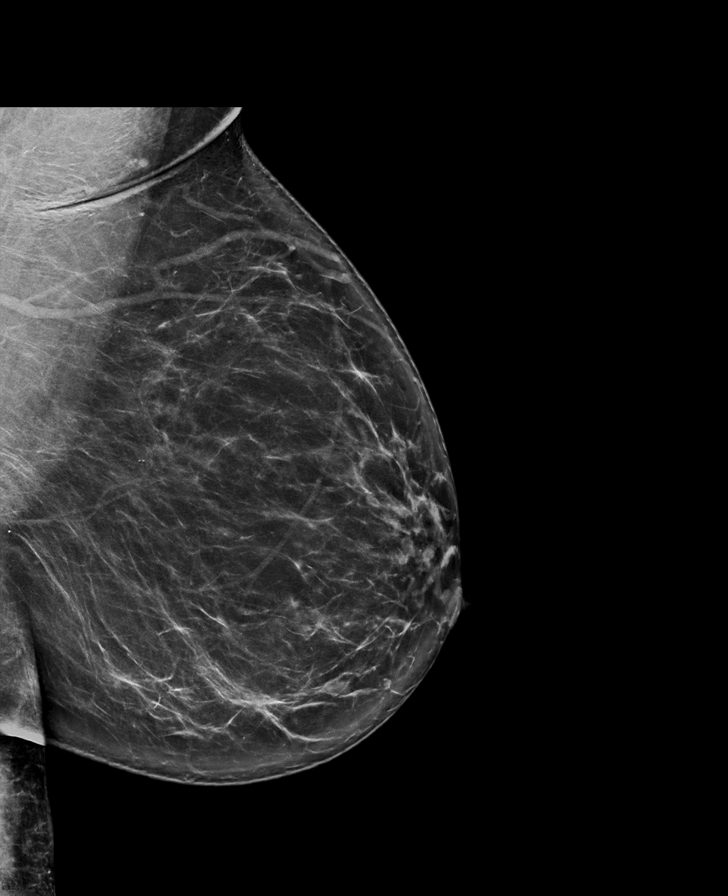

[R MLO synth-2D]
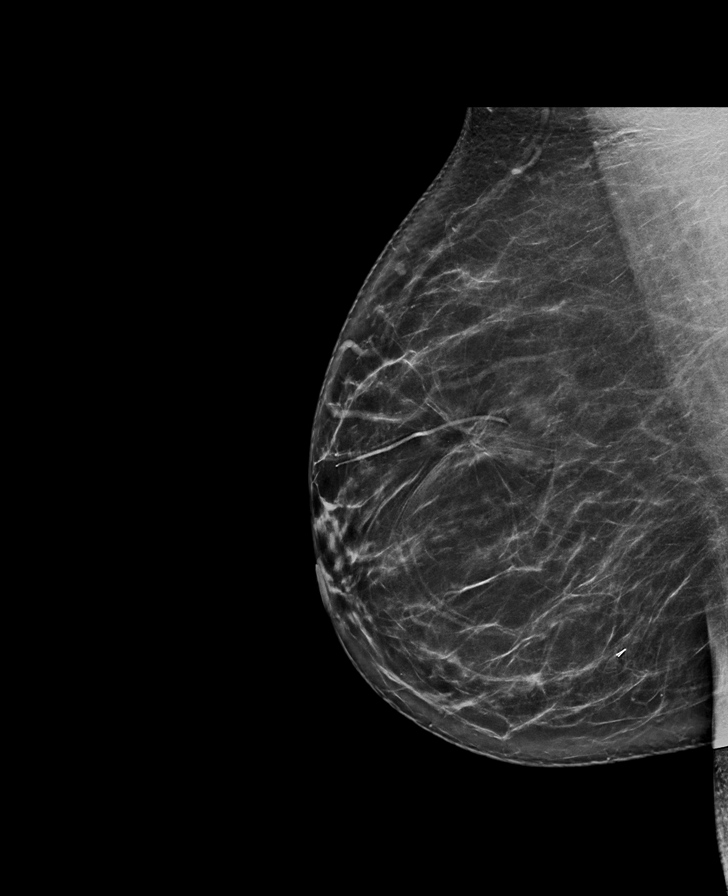

[R CC synth-2D]
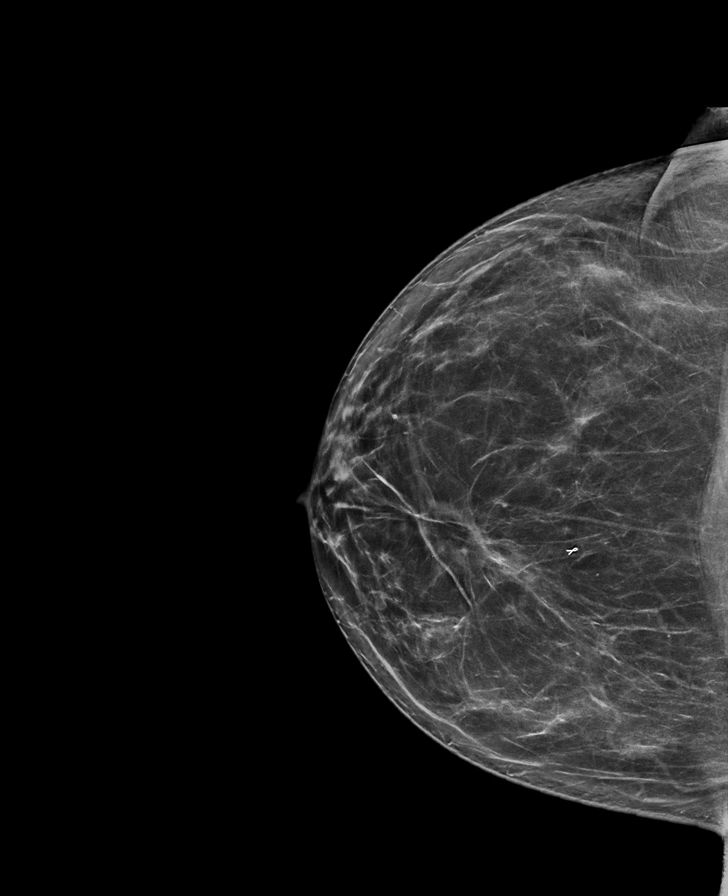

[L CC synth-2D]
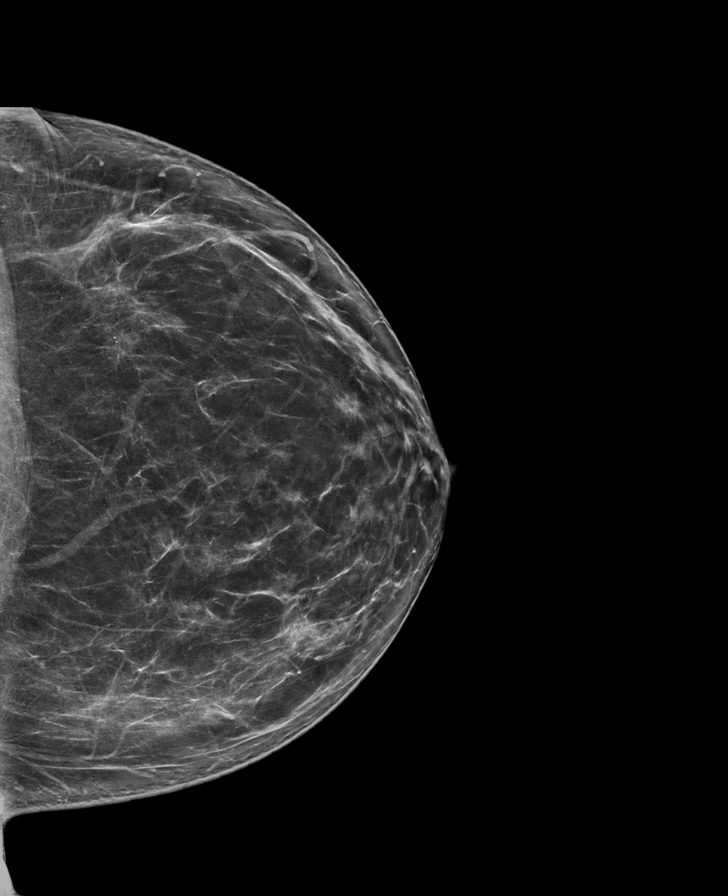

[R CC tomo · tomo slice 41/81.0]
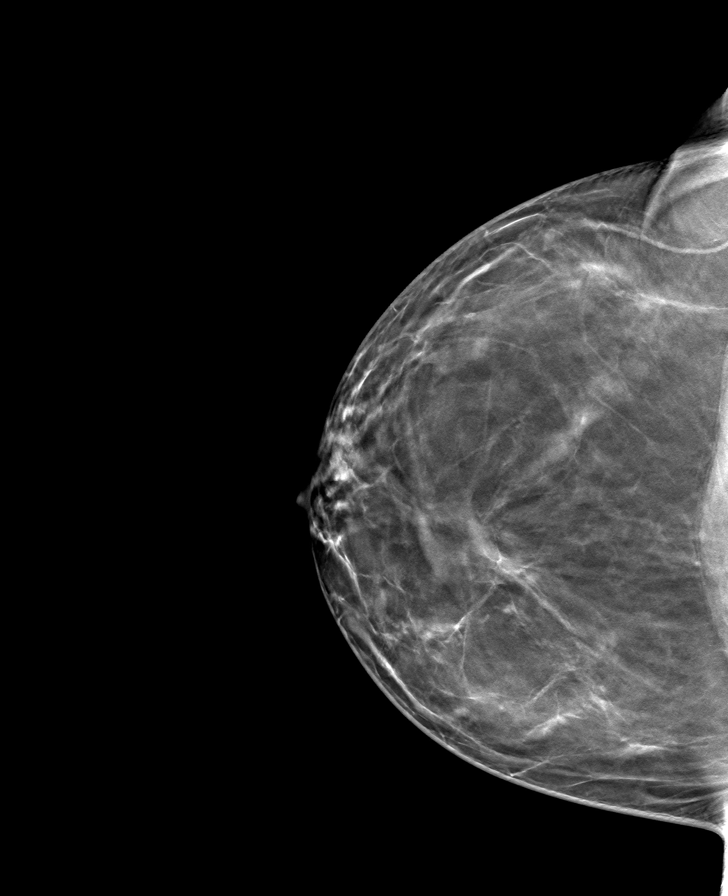

[L MLO tomo · tomo slice 43/86.0]
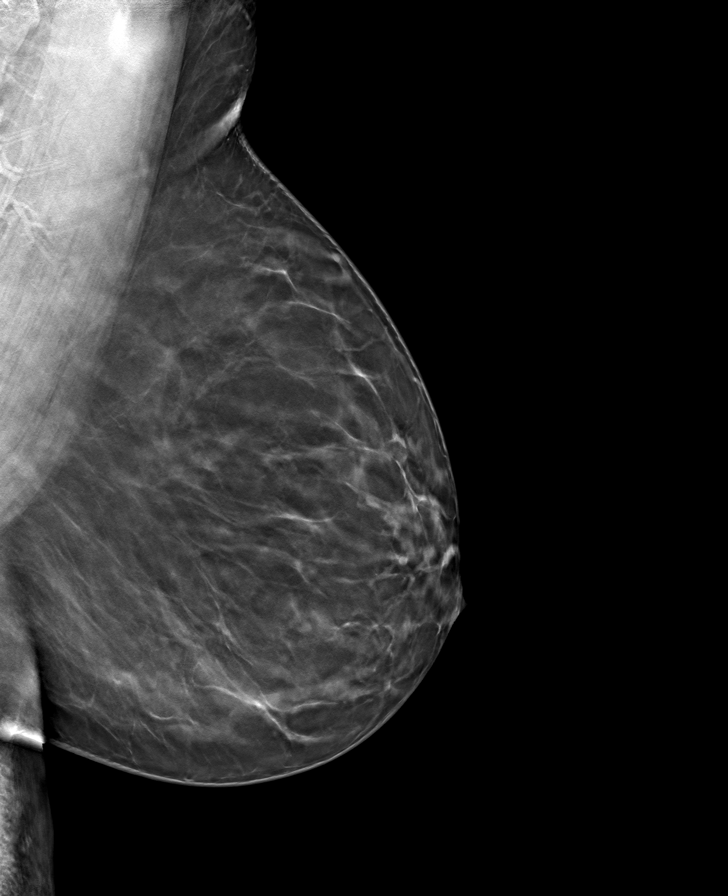

[R MLO tomo · tomo slice 41/82.0]
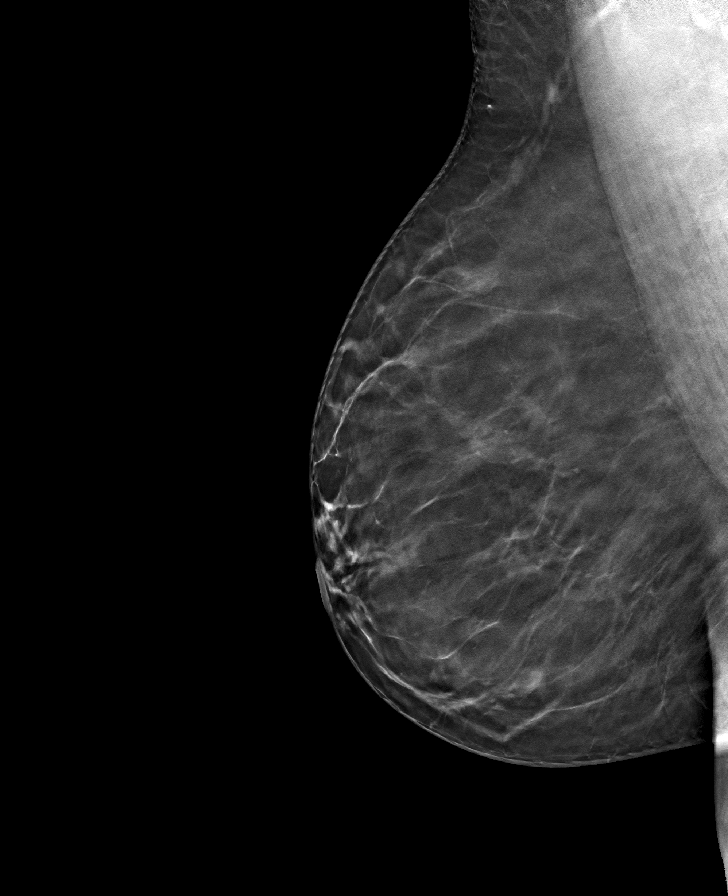

[L CC tomo · tomo slice 39/78.0]
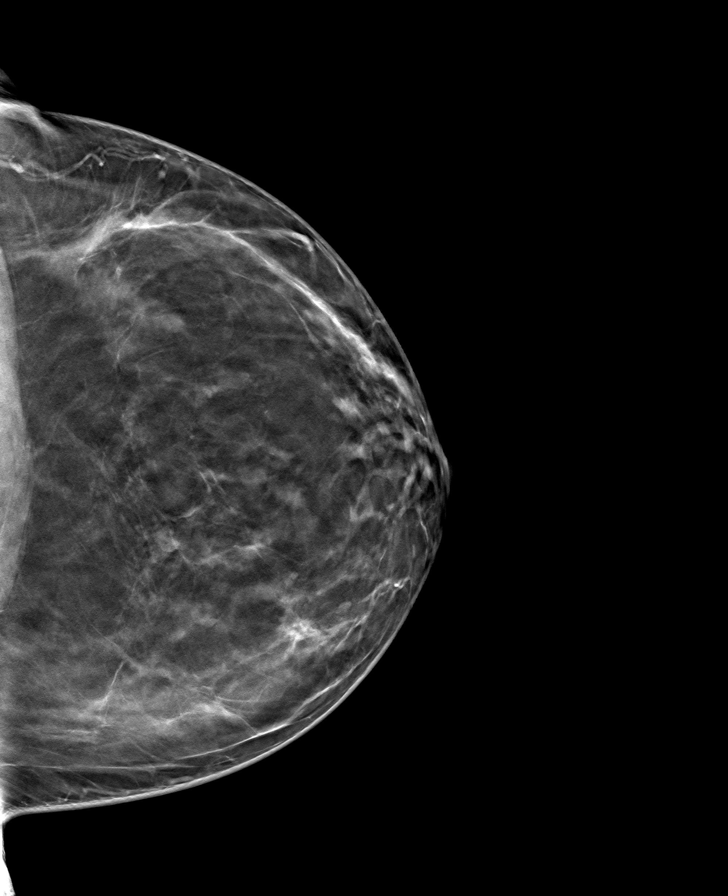

[8 of 24 positions shown; findings below may reference images not displayed]

ACR Breast Density Category b: There are scattered areas of
fibroglandular density.
FINDINGS: There are no findings suspicious for malignancy. Images were
processed with CAD.
IMPRESSION: No mammographic evidence of malignancy. A result letter of this
screening mammogram will be mailed directly to the patient.

RECOMMENDATION:
Screening mammogram in one year. (Code:[id number])

BI-RADS CATEGORY  1: Negative.

## 2019-02-27 DIAGNOSIS — R5383 Other fatigue: Secondary | ICD-10-CM | POA: Diagnosis not present

## 2019-02-27 DIAGNOSIS — R5382 Chronic fatigue, unspecified: Secondary | ICD-10-CM | POA: Diagnosis not present

## 2019-02-27 MED FILL — PHENTERMINE 37.5 MG TABLET: 37.5 | 30 days supply | Qty: 15 | Fill #0

## 2019-02-28 ENCOUNTER — Other Ambulatory Visit: Payer: Self-pay | Admitting: Internal Medicine

## 2019-02-28 DIAGNOSIS — E119 Type 2 diabetes mellitus without complications: Secondary | ICD-10-CM

## 2019-02-28 DIAGNOSIS — E1165 Type 2 diabetes mellitus with hyperglycemia: Secondary | ICD-10-CM

## 2019-02-28 DIAGNOSIS — Z794 Long term (current) use of insulin: Secondary | ICD-10-CM

## 2019-02-28 MED ORDER — METFORMIN HCL ER 500 MG PO TB24: 1000.0000 mg | ORAL_TABLET | Freq: Every day | ORAL | 3 refills | Status: DC

## 2019-02-28 NOTE — Progress Notes (Signed)
A a

## 2019-03-01 MED FILL — metFORMIN HCL ER 500 MG TB2: 500 | 90 days supply | Qty: 180 | Fill #0

## 2019-03-22 ENCOUNTER — Encounter: Payer: 59 | Admitting: Physician Assistant

## 2019-03-22 ENCOUNTER — Encounter: Payer: Self-pay | Admitting: Physician Assistant

## 2019-03-22 ENCOUNTER — Other Ambulatory Visit: Payer: Self-pay

## 2019-03-22 ENCOUNTER — Ambulatory Visit: Payer: 59 | Admitting: Physician Assistant

## 2019-03-22 VITALS — BP 127/84 | HR 72 | Wt 227.2 lb

## 2019-03-22 DIAGNOSIS — IMO0002 Reserved for concepts with insufficient information to code with codable children: Secondary | ICD-10-CM

## 2019-03-22 DIAGNOSIS — G43709 Chronic migraine without aura, not intractable, without status migrainosus: Secondary | ICD-10-CM

## 2019-03-22 DIAGNOSIS — G43009 Migraine without aura, not intractable, without status migrainosus: Secondary | ICD-10-CM | POA: Diagnosis not present

## 2019-03-22 MED ORDER — AJOVY 225 MG/1.5ML ~~LOC~~ SOAJ: 675.0000 mg | SUBCUTANEOUS | 3 refills | Status: DC

## 2019-03-22 NOTE — Progress Notes (Signed)
History:  [name] is a 48 y.o. G3P2010 who presents to clinic today for headache.  She tried Nurtec and had a reaction.  Of note, she ate scuppernong grapes at the same time and she knows those have caused breakouts before.  It is not certain which caused the reaction but she is scared of the medication now.  She is very sensitive to medications.  She is using phentermine to help with weight loss. She has noticed it curbs the appetite.  She has not noticed if it has increased her headaches.   She is planning to get to an endocrinologist soon and notes her diabetes has not had great control of late.  She anticipates changes to her regimen.   She is interested in trying ajovy as the emgality has not been helping as well as it once was.  She is not interested in adding any more pills to her regimen.  HIT6:60 Number of days in the last 4 weeks with:  Severe headache: 10 Moderate headache: 16 Mild headache: 2  No headache: 0   Past Medical History:  Diagnosis Date  . Allergy   . Anemia   . Anxiety    claustrophobic  . Asthma   . Back pain   . Biceps tendonosis of right shoulder   . Diabetes mellitus 2011   did not start metforfin, losing weight  . Dyspnea   . Fatty liver   . Food allergy   . Headache disorder   . History of concussion   . HLD (hyperlipidemia)   . Hypertension   . IBS (irritable bowel syndrome)   . Infertility, female   . Joint pain   . Lactose intolerance   . Leg edema   . Migraines   . Other specified disorders of thyroid   . Palpitation   . Post-menopausal   . Seborrheic dermatitis   . Shoulder impingement syndrome, right   . Vitamin D deficiency     Social History   Socioeconomic History  . Marital status: Divorced    Spouse name: Not on file  . Number of children: 2  . Years of education: 37  . Highest education level: Master's degree (e.g., MA, MS, MEng, MEd, MSW, MBA)  Occupational History  . 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 Financial resource strain: Not on file  . Food insecurity    Worry: Not on file    Inability: Not on file  . Transportation needs    Medical: Not on file    Non-medical: Not on file  Tobacco Use  . Smoking status: Never Smoker  . Smokeless tobacco: Never Used  Substance and Sexual Activity  . Alcohol use: No  . Drug use: No  . Sexual activity: Not Currently    Partners: Male    Birth control/protection: Surgical  Lifestyle  . Physical activity    Days per week: Not on file    Minutes per session: Not on file  . Stress: Not on file  Relationships  . Social Herbalist on phone: Not on file    Gets together: Not on file    Attends religious service: Not on file    Active member of club or organization: Not on file    Attends meetings of clubs or organizations: Not on file    Relationship status: Not on file  . Intimate partner violence    Fear of current  or ex partner: Not on file    Emotionally abused: Not on file    Physically abused: Not on file    Forced sexual activity: Not on file  Other Topics Concern  . Not on file  Social History Narrative   Lives at home with son and daughter.   Right-handed.   1-3 cups caffeine weekly.    Family History  Problem Relation Age of Onset  . Diabetes Mother   . Heart disease Mother 72  . Hypertension Mother   . Hyperlipidemia Mother   . Heart failure Mother   . Stroke Mother   . Thyroid disease Mother   . Depression Mother   . Sleep apnea Mother   . Obesity Mother   . Cancer Father 82       Lung Cancer  . Heart disease Father   . Mental illness Sister        bipolar, substance abuse,  clean 2 yrs  . Hypertension Sister   . Cancer Paternal Grandmother 61       breast cancer  . Breast cancer Paternal Grandmother   . Diabetes Maternal Grandmother   . Hypertension Maternal Grandmother   . Diabetes Son      Current Outpatient Medications on File Prior to Visit  Medication Sig  Dispense Refill  . albuterol (PROVENTIL HFA;VENTOLIN HFA) 108 (90 Base) MCG/ACT inhaler Inhale 2 puffs into the lungs every 6 (six) hours as needed for wheezing or shortness of breath. 1 Inhaler 0  . ALPRAZolam (XANAX) 0.5 MG tablet Take 1 tablet (0.5 mg total) by mouth 3 (three) times daily as needed for anxiety. 20 tablet 0  . aspirin EC 81 MG tablet Take 81 mg by mouth daily.    [name] atorvastatin (LIPITOR) 20 MG tablet TAKE 1 TABLET BY MOUTH DAILY. 90 tablet 1  . Blood Glucose Monitoring Suppl (FREESTYLE LITE) DEVI 1 Device by Does not apply route 2 (two) times daily. Please use [employer] if insurance covers for meter, lancets and strips 1 each 0  . butalbital-acetaminophen-caffeine (FIORICET) 50-325-40 MG tablet Take 1 tablet by mouth every 6 (six) hours as needed for headache. Do not refill in less than 30 day 30 tablet 1  . diazepam (VALIUM) 2 MG tablet Take 2 mg by mouth every 6 (six) hours as needed for anxiety.    [name] diltiazem (CARDIZEM LA) 240 MG 24 hr tablet TAKE 1 TABLET BY MOUTH DAILY 90 tablet 1  . EMGALITY 120 MG/ML SOAJ INJECT 120 MG UNDER THE SKIN EVERY 30 (THIRTY) DAYS. 1 mL 10  . EPINEPHrine 0.3 mg/0.3 mL IJ SOAJ injection Inject 0.3 mg into the muscle as needed (for allergic reaction).   3  . glipiZIDE (GLUCOTROL) 5 MG tablet Take 1 tablet (5 mg total) by mouth 2 (two) times daily before a meal. 180 tablet 3  . glucose blood (FREESTYLE TEST STRIPS) test strip Use as instructed 100 each 2  . insulin degludec (TRESIBA FLEXTOUCH) 100 UNIT/ML SOPN FlexTouch Pen Inject 0.36 mLs (36 Units total) into the skin daily. Increase as instructed. Max daily dose 50 units. 15 mL 2  . Insulin Pen Needle (PEN NEEDLES) 32G X 5 MM MISC 1 pen by Does not apply route daily. 100 each 3  . ketorolac (TORADOL) 10 MG tablet Take 1 tablet (10 mg total) by mouth every 6 (six) hours as needed. 30 tablet 1  . Lancets (FREESTYLE) lancets Use as instructed 100 each 2  . losartan-hydrochlorothiazide (HYZAAR)  50-12.5 MG  tablet TAKE 1 TABLET BY MOUTH DAILY 90 tablet 2  . meclizine (ANTIVERT) 25 MG tablet Take 25 mg by mouth 3 (three) times daily as needed for dizziness.    . metFORMIN (GLUCOPHAGE-XR) 500 MG 24 hr tablet Take 2 tablets (1,000 mg total) by mouth daily with breakfast. 180 tablet 3  . metoprolol succinate (TOPROL-XL) 25 MG 24 hr tablet Take 0.5 tablets (12.5 mg total) by mouth daily. 45 tablet 3  . ondansetron (ZOFRAN ODT) 4 MG disintegrating tablet Take 1 tablet (4 mg total) by mouth every 8 (eight) hours as needed for nausea or vomiting. 20 tablet 0  . oxyCODONE-acetaminophen (ROXICET) 5-325 MG tablet Take 1-2 tablets by mouth every 4 (four) hours as needed for severe pain. (Patient not taking: Reported on 03/22/2019) 40 tablet 0  . zonisamide (ZONEGRAN) 100 MG capsule Take 1 capsule (100 mg total) by mouth daily. (Patient not taking: Reported on 01/04/2019) 540 capsule 0   No current facility-administered medications on file prior to visit.      Review of Systems:  All pertinent positive/negative included in HPI, all other review of systems are negative   Objective:  Physical Exam BP 127/84   Pulse 72   Wt 227 lb 3.2 oz (103.1 kg)   BMI 36.67 kg/m  CONSTITUTIONAL: Well-developed, well-nourished female in no acute distress.  EYES: EOM intact ENT: Normocephalic CARDIOVASCULAR: Regular rate RESPIRATORY: Normal rate.  MUSCULOSKELETAL: Normal ROM SKIN: Warm, dry without erythema  NEUROLOGICAL: Alert, oriented, CN II-XII grossly intact, Appropriate balance PSYCH: Normal behavior, mood   Assessment & Plan:  Assessment: 1. Chronic migraine   2. Migraine without aura and without status migrainosus, not intractable     worsening  Plan: Replace Emgality with Ajovy.  She may use 3 ajovy every 3 months.  Info and sample provided.  Pt practiced with demo.  All questions answered.  No other changes as pt is so sensitive. Follow-up in 2-37months or sooner PRN  Paticia Stack, PA-C 03/22/2019 11:34 AM

## 2019-03-25 ENCOUNTER — Telehealth: Payer: Self-pay

## 2019-03-25 NOTE — Telephone Encounter (Signed)
PA has been sent to patient insurance carrier.

## 2019-03-26 MED FILL — METFORMIN HCL ER 500 MG TB2: 500 | 90 days supply | Qty: 180 | Fill #0

## 2019-03-27 DIAGNOSIS — R5382 Chronic fatigue, unspecified: Secondary | ICD-10-CM | POA: Diagnosis not present

## 2019-03-27 MED FILL — PHENTERMINE 37.5 MG TABLET: 37.5 | 30 days supply | Qty: 30 | Fill #0

## 2019-03-29 ENCOUNTER — Other Ambulatory Visit: Payer: Self-pay | Admitting: Internal Medicine

## 2019-03-29 MED FILL — ATORVASTATIN 20 MG TABLET: 20 | 90 days supply | Qty: 90 | Fill #0

## 2019-03-29 MED FILL — TRESIBA FLEXTOUCH 100 UNITS: 100 | 30 days supply | Qty: 15 | Fill #2

## 2019-04-02 ENCOUNTER — Telehealth: Payer: Self-pay | Admitting: *Deleted

## 2019-04-02 NOTE — Telephone Encounter (Signed)
Called pt to inform her that Insurance denied Ajovy due to needing trial and failure of aimovig. Pt does have RX savings card she can use for Ajovy and informed her that if she does do well on continues on ajovy we can file an appeal with the insurance.

## 2019-04-03 ENCOUNTER — Other Ambulatory Visit: Payer: Self-pay | Admitting: Internal Medicine

## 2019-04-03 ENCOUNTER — Other Ambulatory Visit: Payer: Self-pay

## 2019-04-03 DIAGNOSIS — Z1231 Encounter for screening mammogram for malignant neoplasm of breast: Secondary | ICD-10-CM

## 2019-04-05 ENCOUNTER — Ambulatory Visit (INDEPENDENT_AMBULATORY_CARE_PROVIDER_SITE_OTHER): Payer: 59 | Admitting: Endocrinology

## 2019-04-05 ENCOUNTER — Other Ambulatory Visit: Payer: Self-pay

## 2019-04-05 ENCOUNTER — Encounter: Payer: Self-pay | Admitting: Endocrinology

## 2019-04-05 VITALS — BP 148/82 | HR 95 | Ht 66.0 in | Wt 224.4 lb

## 2019-04-05 DIAGNOSIS — E1165 Type 2 diabetes mellitus with hyperglycemia: Secondary | ICD-10-CM

## 2019-04-05 DIAGNOSIS — E119 Type 2 diabetes mellitus without complications: Secondary | ICD-10-CM

## 2019-04-05 LAB — POCT GLYCOSYLATED HEMOGLOBIN (HGB A1C): Hemoglobin A1C: 7.5 % — AB (ref 4.0–5.6)

## 2019-04-05 MED ORDER — TRESIBA FLEXTOUCH 100 UNIT/ML ~~LOC~~ SOPN: 45.0000 [IU] | PEN_INJECTOR | Freq: Every day | SUBCUTANEOUS | 2 refills | Status: DC

## 2019-04-05 NOTE — Patient Instructions (Addendum)
Your blood pressure is high today.  Please see your primary care provider soon, to have it rechecked good diet and exercise significantly improve the control of your diabetes.  please let me know if you wish to be referred to a dietician.  high blood sugar is very risky to your health.  you should see an eye doctor and dentist every year.  It is very important to get all recommended vaccinations.  Controlling your blood pressure and cholesterol drastically reduces the damage diabetes does to your body.  Those who smoke should quit.  Please discuss these with your doctor.  check your blood sugar twice a day.  vary the time of day when you check, between before the 3 meals, and at bedtime.  also check if you have symptoms of your blood sugar being too high or too low.  please keep a record of the readings and bring it to your next appointment here (or you can bring the meter itself).  You can write it on any piece of paper.  please call us sooner if your blood sugar goes below 70, or if you have a lot of readings over 200.  Please stop taking the glipizide, and:  Increase the Tresiba to 45 units daily, and:  Please continue the same metformin.  Here is a brochure about the "V-GO" disposable insulin pump.  Please see [name], to discuss further.

## 2019-04-05 NOTE — Progress Notes (Signed)
Subjective:    Patient ID: [name], female    DOB: Apr 13, 1971, 48 y.o.   MRN: [id number]  HPI pt is referred by Dr Derrel Nip, for diabetes.  Pt states DM was dx'ed in 2013 (she had GDM in 1990 and 2004); she has mild if any neuropathy of the lower extremities; she is unaware of any associated chronic complications; she has been on insulin since 2018; pt says her diet and exercise are good; she has never had pancreatitis, pancreatic surgery, severe hypoglycemia or DKA.  She takes [place] 40 units qd, and 2 oral meds.  She says cbg varies from 88-117.  She checks fasting only.  She did not tolerate Jardiance (vaginitis) ot Victoza (dysphagia).    Past Medical History:  Diagnosis Date  . Allergy   . Anemia   . Anxiety    claustrophobic  . Asthma   . Back pain   . Biceps tendonosis of right shoulder   . Diabetes mellitus 2011   did not start metforfin, losing weight  . Dyspnea   . Fatty liver   . Food allergy   . Headache disorder   . History of concussion   . HLD (hyperlipidemia)   . Hypertension   . IBS (irritable bowel syndrome)   . Infertility, female   . Joint pain   . Lactose intolerance   . Leg edema   . Migraines   . Other specified disorders of thyroid   . Palpitation   . Post-menopausal   . Seborrheic dermatitis   . Shoulder impingement syndrome, right   . Vitamin D deficiency     Past Surgical History:  Procedure Laterality Date  . ABDOMINAL HYSTERECTOMY  2006   heavy menses, endometriosis, l oophrectomy  . BACK SURGERY    . BREAST BIOPSY Right 2018   benign  . BREAST EXCISIONAL BIOPSY Right 2003   benign  . BREAST EXCISIONAL BIOPSY Right 1999   benign  . BREAST SURGERY     right breast x 2 , benign  . HERNIA REPAIR  2003   left inguinal   . LEFT OOPHORECTOMY    . SHOULDER ARTHROSCOPY WITH SUBACROMIAL DECOMPRESSION AND OPEN ROTATOR C Right 07/14/2016   Procedure: RIGHT SHOULDER ARTHROSCOPY WITH SUBACROMIAL DECOMPRESSION, DISTAL CLAVICLE RESECTION AND  MINI OPEN ROTATOR CUFF REPAIR, OPEN BICEP TENDODESIS;  Surgeon: Garald Balding, MD;  Location: [facility];  Service: Orthopedics;  Laterality: Right;  . SHOULDER CLOSED REDUCTION Right 09/08/2016   Procedure: RIGHT CLOSED MANIPULATION SHOULDER;  Surgeon: Garald Balding, MD;  Location: [facility];  Service: Orthopedics;  Laterality: Right;  . TUBAL LIGATION      Social History   Socioeconomic History  . Marital status: Divorced    Spouse name: Not on file  . Number of children: 2  . Years of education: 80  . Highest education level: Master's degree (e.g., MA, MS, MEng, MEd, MSW, MBA)  Occupational History  . 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 Financial resource strain: Not on file  . Food insecurity    Worry: Not on file    Inability: Not on file  . Transportation needs    Medical: Not on file    Non-medical: Not on file  Tobacco Use  . Smoking status: Never Smoker  . Smokeless tobacco: Never Used  Substance and Sexual Activity  . Alcohol use: No  . Drug use: No  .  Sexual activity: Not Currently    Partners: Male    Birth control/protection: Surgical  Lifestyle  . Physical activity    Days per week: Not on file    Minutes per session: Not on file  . Stress: Not on file  Relationships  . Social Herbalist on phone: Not on file    Gets together: Not on file    Attends religious service: Not on file    Active member of club or organization: Not on file    Attends meetings of clubs or organizations: Not on file    Relationship status: Not on file  . Intimate partner violence    Fear of current or ex partner: Not on file    Emotionally abused: Not on file    Physically abused: Not on file    Forced sexual activity: Not on file  Other Topics Concern  . Not on file  Social History Narrative   Lives at home with son and daughter.   Right-handed.   1-3 cups caffeine weekly.    Current  Outpatient Medications on File Prior to Visit  Medication Sig Dispense Refill  . albuterol (PROVENTIL HFA;VENTOLIN HFA) 108 (90 Base) MCG/ACT inhaler Inhale 2 puffs into the lungs every 6 (six) hours as needed for wheezing or shortness of breath. 1 Inhaler 0  . ALPRAZolam (XANAX) 0.5 MG tablet Take 1 tablet (0.5 mg total) by mouth 3 (three) times daily as needed for anxiety. 20 tablet 0  . aspirin EC 81 MG tablet Take 81 mg by mouth daily.    [name] atorvastatin (LIPITOR) 20 MG tablet TAKE 1 TABLET BY MOUTH DAILY. 90 tablet 3  . Blood Glucose Monitoring Suppl (FREESTYLE LITE) DEVI 1 Device by Does not apply route 2 (two) times daily. Please use [employer] if insurance covers for meter, lancets and strips 1 each 0  . butalbital-acetaminophen-caffeine (FIORICET) 50-325-40 MG tablet Take 1 tablet by mouth every 6 (six) hours as needed for headache. Do not refill in less than 30 day 30 tablet 1  . diazepam (VALIUM) 2 MG tablet Take 2 mg by mouth every 6 (six) hours as needed for anxiety.    [name] diltiazem (CARDIZEM LA) 240 MG 24 hr tablet TAKE 1 TABLET BY MOUTH DAILY 90 tablet 1  . EMGALITY 120 MG/ML SOAJ INJECT 120 MG UNDER THE SKIN EVERY 30 (THIRTY) DAYS. 1 mL 10  . EPINEPHrine 0.3 mg/0.3 mL IJ SOAJ injection Inject 0.3 mg into the muscle as needed (for allergic reaction).   3  . Fremanezumab-vfrm (AJOVY) 225 MG/1.5ML SOAJ Inject 675 mg into the skin every 3 (three) months. 3 pen 3  . glucose blood (FREESTYLE TEST STRIPS) test strip Use as instructed 100 each 2  . Insulin Pen Needle (PEN NEEDLES) 32G X 5 MM MISC 1 pen by Does not apply route daily. 100 each 3  . ketorolac (TORADOL) 10 MG tablet Take 1 tablet (10 mg total) by mouth every 6 (six) hours as needed. 30 tablet 1  . Lancets (FREESTYLE) lancets Use as instructed 100 each 2  . losartan-hydrochlorothiazide (HYZAAR) 50-12.5 MG tablet TAKE 1 TABLET BY MOUTH DAILY 90 tablet 2  . meclizine (ANTIVERT) 25 MG tablet Take 25 mg by mouth 3 (three) times daily  as needed for dizziness.    . metFORMIN (GLUCOPHAGE-XR) 500 MG 24 hr tablet Take 2 tablets (1,000 mg total) by mouth daily with breakfast. (Patient taking differently: Take 1,000 mg by mouth 2 (two) times daily. )  180 tablet 3  . metoprolol succinate (TOPROL-XL) 25 MG 24 hr tablet Take 0.5 tablets (12.5 mg total) by mouth daily. 45 tablet 3  . ondansetron (ZOFRAN ODT) 4 MG disintegrating tablet Take 1 tablet (4 mg total) by mouth every 8 (eight) hours as needed for nausea or vomiting. 20 tablet 0  . oxyCODONE-acetaminophen (ROXICET) 5-325 MG tablet Take 1-2 tablets by mouth every 4 (four) hours as needed for severe pain. 40 tablet 0   No current facility-administered medications on file prior to visit.     Family History  Problem Relation Age of Onset  . Diabetes Mother   . Heart disease Mother 64  . Hypertension Mother   . Hyperlipidemia Mother   . Heart failure Mother   . Stroke Mother   . Thyroid disease Mother   . Depression Mother   . Sleep apnea Mother   . Obesity Mother   . Cancer Father 73       Lung Cancer  . Heart disease Father   . Mental illness Sister        bipolar, substance abuse,  clean 2 yrs  . Hypertension Sister   . Cancer Paternal Grandmother 34       breast cancer  . Breast cancer Paternal Grandmother   . Diabetes Maternal Grandmother   . Hypertension Maternal Grandmother   . Diabetes Son     BP (!) 148/82 (BP Location: Left Arm, Patient Position: Sitting, Cuff Size: Large)   Pulse 95   Ht 5\' 6"  (1.676 m)   Wt 224 lb 6.4 oz (101.8 kg)   SpO2 96%   BMI 36.22 kg/m    Review of Systems denies blurry vision, headache, chest pain, sob, n/v, urinary frequency, muscle cramps, excessive diaphoresis, memory loss, depression, cold intolerance, rhinorrhea, and easy bruising.  She has gained weight.      Objective:   Physical Exam VS: see vs page GEN: no distress HEAD: head: no deformity eyes: no periorbital swelling, no proptosis external nose and ears  are normal NECK: supple, thyroid is not enlarged CHEST WALL: no deformity LUNGS: clear to auscultation CV: reg rate and rhythm, no murmur ABD: abdomen is soft, nontender.  no hepatosplenomegaly.  not distended.  no hernia.   MUSCULOSKELETAL: muscle bulk and strength are grossly normal.  no obvious joint swelling.  gait is normal and steady EXTEMITIES: no deformity.  no ulcer on the feet.  feet are of normal color and temp.  no edema PULSES: dorsalis pedis intact bilat.  no carotid bruit NEURO:  cn 2-12 grossly intact.   readily moves all 4's.  sensation is intact to touch on the feet SKIN:  Normal texture and temperature.  No rash or suspicious lesion is visible.   NODES:  None palpable at the neck.    PSYCH: alert, well-oriented.  Does not appear anxious nor depressed.    Lab Results  Component Value Date   HGBA1C 7.5 (A) 04/05/2019   Lab Results  Component Value Date   CREATININE 0.87 10/23/2018   BUN 13 10/23/2018   NA 141 10/23/2018   K 3.5 10/23/2018   CL 108 10/23/2018   CO2 24 10/23/2018   I have reviewed outside records, and summarized: Pt was noted to have elevated A1c, and referred here.   Pt reported multiple DM med intolerances.      Assessment & Plan:  HTN: is noted today.  Insulin-requiring type 2 DM, new to me: goal A1c would be 7%, but she  would need multiple daily injections or pump rx to safely achieve this.    Patient Instructions  Your blood pressure is high today.  Please see your primary care provider soon, to have it rechecked good diet and exercise significantly improve the control of your diabetes.  please let me know if you wish to be referred to a dietician.  high blood sugar is very risky to your health.  you should see an eye doctor and dentist every year.  It is very important to get all recommended vaccinations.  Controlling your blood pressure and cholesterol drastically reduces the damage diabetes does to your body.  Those who smoke should quit.   Please discuss these with your doctor.  check your blood sugar twice a day.  vary the time of day when you check, between before the 3 meals, and at bedtime.  also check if you have symptoms of your blood sugar being too high or too low.  please keep a record of the readings and bring it to your next appointment here (or you can bring the meter itself).  You can write it on any piece of paper.  please call us sooner if your blood sugar goes below 70, or if you have a lot of readings over 200.  Please stop taking the glipizide, and:  Increase the Tresiba to 45 units daily, and:  Please continue the same metformin.  Here is a brochure about the "V-GO" disposable insulin pump.  Please see Vaughan Basta, to discuss further.

## 2019-04-17 ENCOUNTER — Encounter: Payer: Self-pay | Admitting: Endocrinology

## 2019-04-18 ENCOUNTER — Ambulatory Visit (INDEPENDENT_AMBULATORY_CARE_PROVIDER_SITE_OTHER): Payer: 59 | Admitting: Obstetrics & Gynecology

## 2019-04-18 ENCOUNTER — Other Ambulatory Visit: Payer: Self-pay

## 2019-04-18 ENCOUNTER — Encounter: Payer: Self-pay | Admitting: Obstetrics & Gynecology

## 2019-04-18 VITALS — BP 129/83 | HR 88 | Wt 225.0 lb

## 2019-04-18 DIAGNOSIS — Z01419 Encounter for gynecological examination (general) (routine) without abnormal findings: Secondary | ICD-10-CM

## 2019-04-18 DIAGNOSIS — N898 Other specified noninflammatory disorders of vagina: Secondary | ICD-10-CM | POA: Diagnosis not present

## 2019-04-18 DIAGNOSIS — Z113 Encounter for screening for infections with a predominantly sexual mode of transmission: Secondary | ICD-10-CM | POA: Diagnosis not present

## 2019-04-18 NOTE — Progress Notes (Signed)
GYNECOLOGY ANNUAL PREVENTATIVE CARE ENCOUNTER NOTE  History:     [name] is a 48 y.o. G43P2010 female here for a routine annual gynecologic exam.  She is s/p TAH/LSO for pelvic pain and AUB. Current complaints: none.   Denies abnormal vaginal bleeding, discharge, pelvic pain, problems with intercourse or other gynecologic concerns.    Gynecologic History No LMP recorded. Patient has had a hysterectomy. Last mammogram: 02/19/2018. Results were: normal  Obstetric History OB History  Gravida Para Term Preterm AB Living  3 2 2  0 1 0  SAB TAB Ectopic Multiple Live Births  0 0 1 0 0    # Outcome Date GA Lbr Len/2nd Weight Sex Delivery Anes PTL Lv  3 Term           2 Term           1 Ectopic             Past Medical History:  Diagnosis Date  . Allergy   . Anemia   . Anxiety    claustrophobic  . Asthma   . Back pain   . Biceps tendonosis of right shoulder   . Diabetes mellitus 2011   did not start metforfin, losing weight  . Dyspnea   . Fatty liver   . Food allergy   . Headache disorder   . History of concussion   . HLD (hyperlipidemia)   . Hypertension   . IBS (irritable bowel syndrome)   . Infertility, female   . Joint pain   . Lactose intolerance   . Leg edema   . Migraines   . Other specified disorders of thyroid   . Palpitation   . Post-menopausal   . Seborrheic dermatitis   . Shoulder impingement syndrome, right   . Vitamin D deficiency     Past Surgical History:  Procedure Laterality Date  . ABDOMINAL HYSTERECTOMY  2006   heavy menses, endometriosis, l oophrectomy  . BACK SURGERY    . BREAST BIOPSY Right 2018   benign  . BREAST EXCISIONAL BIOPSY Right 2003   benign  . BREAST EXCISIONAL BIOPSY Right 1999   benign  . BREAST SURGERY     right breast x 2 , benign  . HERNIA REPAIR  2003   left inguinal   . LEFT OOPHORECTOMY    . SHOULDER ARTHROSCOPY WITH SUBACROMIAL DECOMPRESSION AND OPEN ROTATOR C Right 07/14/2016   Procedure: RIGHT  SHOULDER ARTHROSCOPY WITH SUBACROMIAL DECOMPRESSION, DISTAL CLAVICLE RESECTION AND MINI OPEN ROTATOR CUFF REPAIR, OPEN BICEP TENDODESIS;  Surgeon: Garald Balding, MD;  Location: [facility];  Service: Orthopedics;  Laterality: Right;  . SHOULDER CLOSED REDUCTION Right 09/08/2016   Procedure: RIGHT CLOSED MANIPULATION SHOULDER;  Surgeon: Garald Balding, MD;  Location: [facility];  Service: Orthopedics;  Laterality: Right;  . TUBAL LIGATION      Current Outpatient Medications on File Prior to Visit  Medication Sig Dispense Refill  . albuterol (PROVENTIL HFA;VENTOLIN HFA) 108 (90 Base) MCG/ACT inhaler Inhale 2 puffs into the lungs every 6 (six) hours as needed for wheezing or shortness of breath. 1 Inhaler 0  . ALPRAZolam (XANAX) 0.5 MG tablet Take 1 tablet (0.5 mg total) by mouth 3 (three) times daily as needed for anxiety. 20 tablet 0  . aspirin EC 81 MG tablet Take 81 mg by mouth daily.    [name] atorvastatin (LIPITOR) 20 MG tablet TAKE 1 TABLET BY MOUTH DAILY. 90 tablet 3  .  Blood Glucose Monitoring Suppl (FREESTYLE LITE) DEVI 1 Device by Does not apply route 2 (two) times daily. Please use [facility] if insurance covers for meter, lancets and strips 1 each 0  . butalbital-acetaminophen-caffeine (FIORICET) 50-325-40 MG tablet Take 1 tablet by mouth every 6 (six) hours as needed for headache. Do not refill in less than 30 day 30 tablet 1  . diazepam (VALIUM) 2 MG tablet Take 2 mg by mouth every 6 (six) hours as needed for anxiety.    [name] diltiazem (CARDIZEM LA) 240 MG 24 hr tablet TAKE 1 TABLET BY MOUTH DAILY 90 tablet 1  . EMGALITY 120 MG/ML SOAJ INJECT 120 MG UNDER THE SKIN EVERY 30 (THIRTY) DAYS. 1 mL 10  . EPINEPHrine 0.3 mg/0.3 mL IJ SOAJ injection Inject 0.3 mg into the muscle as needed (for allergic reaction).   3  . Fremanezumab-vfrm (AJOVY) 225 MG/1.5ML SOAJ Inject 675 mg into the skin every 3 (three) months. 3 pen 3  . glucose blood (FREESTYLE TEST STRIPS) test  strip Use as instructed 100 each 2  . insulin degludec (TRESIBA FLEXTOUCH) 100 UNIT/ML SOPN FlexTouch Pen Inject 0.45 mLs (45 Units total) into the skin daily. 15 mL 2  . Insulin Pen Needle (PEN NEEDLES) 32G X 5 MM MISC 1 pen by Does not apply route daily. 100 each 3  . ketorolac (TORADOL) 10 MG tablet Take 1 tablet (10 mg total) by mouth every 6 (six) hours as needed. 30 tablet 1  . Lancets (FREESTYLE) lancets Use as instructed 100 each 2  . losartan-hydrochlorothiazide (HYZAAR) 50-12.5 MG tablet TAKE 1 TABLET BY MOUTH DAILY 90 tablet 2  . meclizine (ANTIVERT) 25 MG tablet Take 25 mg by mouth 3 (three) times daily as needed for dizziness.    . metFORMIN (GLUCOPHAGE-XR) 500 MG 24 hr tablet Take 2 tablets (1,000 mg total) by mouth daily with breakfast. (Patient taking differently: Take 1,000 mg by mouth 2 (two) times daily. ) 180 tablet 3  . metoprolol succinate (TOPROL-XL) 25 MG 24 hr tablet Take 0.5 tablets (12.5 mg total) by mouth daily. 45 tablet 3  . ondansetron (ZOFRAN ODT) 4 MG disintegrating tablet Take 1 tablet (4 mg total) by mouth every 8 (eight) hours as needed for nausea or vomiting. 20 tablet 0  . oxyCODONE-acetaminophen (ROXICET) 5-325 MG tablet Take 1-2 tablets by mouth every 4 (four) hours as needed for severe pain. 40 tablet 0  . phentermine 37.5 MG capsule Take 37.5 mg by mouth every morning.     No current facility-administered medications on file prior to visit.     Allergies  Allergen Reactions  . Botox [Onabotulinumtoxina] Shortness Of Breath    syncope  . Clindamycin/Lincomycin Other (See Comments)  . Kiwi Extract Shortness Of Breath  . Maxalt [Rizatriptan Benzoate] Anaphylaxis    Chest pain  . Nitrous Oxide Shortness Of Breath    syncope  . Triptans Shortness Of Breath  . Aspirin Nausea And Vomiting    Mouth blisters  . Contrast Media [Iodinated Diagnostic Agents]   . Flagyl [Metronidazole]   . Hydrocodone-Acetaminophen Other (See Comments)  . Latex      Sometimes causes rash  . Mango Flavor   . Rizatriptan Other (See Comments)  . Vicodin [Hydrocodone-Acetaminophen]     hallucinations  . Flu Virus Vaccine Palpitations  . Penicillins Rash, Other (See Comments) and Nausea And Vomiting  . Tetracyclines & Related Nausea And Vomiting and Rash    Social History:  reports that she has never smoked.  She has never used smokeless tobacco. She reports that she does not drink alcohol or use drugs.  Family History  Problem Relation Age of Onset  . Diabetes Mother   . Heart disease Mother 59  . Hypertension Mother   . Hyperlipidemia Mother   . Heart failure Mother   . Stroke Mother   . Thyroid disease Mother   . Depression Mother   . Sleep apnea Mother   . Obesity Mother   . Cancer Father 37       Lung Cancer  . Heart disease Father   . Mental illness Sister        bipolar, substance abuse,  clean 2 yrs  . Hypertension Sister   . Cancer Paternal Grandmother 37       breast cancer  . Breast cancer Paternal Grandmother   . Diabetes Maternal Grandmother   . Hypertension Maternal Grandmother   . Diabetes Son     The following portions of the patient's history were reviewed and updated as appropriate: allergies, current medications, past family history, past medical history, past social history, past surgical history and problem list.  Review of Systems Pertinent items noted in HPI and remainder of comprehensive ROS otherwise negative.  Physical Exam:  BP 129/83   Pulse 88   Wt 225 lb (102.1 kg)   BMI 36.32 kg/m  CONSTITUTIONAL: Well-developed, well-nourished female in no acute distress.  HENT:  Normocephalic, atraumatic, External right and left ear normal. Oropharynx is clear and moist EYES: Conjunctivae and EOM are normal. Pupils are equal, round, and reactive to light. No scleral icterus.  NECK: Normal range of motion, supple, no masses.  Normal thyroid.  SKIN: Skin is warm and dry. No rash noted. Not diaphoretic. No erythema. No  pallor. MUSCULOSKELETAL: Normal range of motion. No tenderness.  No cyanosis, clubbing, or edema.  2+ distal pulses. NEUROLOGIC: Alert and oriented to person, place, and time. Normal reflexes, muscle tone coordination.  PSYCHIATRIC: Normal mood and affect. Normal behavior. Normal judgment and thought content. CARDIOVASCULAR: Normal heart rate noted, regular rhythm RESPIRATORY: Clear to auscultation bilaterally. Effort and breath sounds normal, no problems with respiration noted. BREASTS: Symmetric in size. No masses, tenderness, skin changes, nipple drainage, or lymphadenopathy bilaterally. ABDOMEN: Soft, no distention noted.  No tenderness, rebound or guarding.  PELVIC: Normal appearing external genitalia and urethral meatus; normal appearing vaginal mucosa and cuff. No irritation noted on vulva or vagina. Scant white discharge noted, sample obtained. No adnexal tenderness.   Assessment and Plan:    1. Well woman exam with routine gynecological exam Normal exam.  Desires annual STI check. Labs drawn, will follow up results and manage accordingly. - Hepatitis B surface antigen - Hepatitis C antibody - HIV antibody - RPR - Cervicovaginal ancillary only Mammogram scheduled in December 2020. Routine preventative health maintenance measures emphasized. Please refer to After Visit Summary for other counseling recommendations.      Verita Schneiders, MD, [place] for [employer], [place]

## 2019-04-18 NOTE — Patient Instructions (Addendum)
Preventive Care 44-49 Years Old, Female Preventive care refers to visits with your health care provider and lifestyle choices that can promote health and wellness. This includes:  A yearly physical exam. This may also be called an annual well check.  Regular dental visits and eye exams.  Immunizations.  Screening for certain conditions.  Healthy lifestyle choices, such as eating a healthy diet, getting regular exercise, not using drugs or products that contain nicotine and tobacco, and limiting alcohol use. What can I expect for my preventive care visit? Physical exam Your health care provider will check your:  Height and weight. This may be used to calculate body mass index (BMI), which tells if you are at a healthy weight.  Heart rate and blood pressure.  Skin for abnormal spots. Counseling Your health care provider may ask you questions about your:  Alcohol, tobacco, and drug use.  Emotional well-being.  Home and relationship well-being.  Sexual activity.  Eating habits.  Work and work Statistician.  Method of birth control.  Menstrual cycle.  Pregnancy history. What immunizations do I need?  Influenza (flu) vaccine  This is recommended every year. Tetanus, diphtheria, and pertussis (Tdap) vaccine  You may need a Td booster every 10 years. Varicella (chickenpox) vaccine  You may need this if you have not been vaccinated. Zoster (shingles) vaccine  You may need this after age 56. Measles, mumps, and rubella (MMR) vaccine  You may need at least one dose of MMR if you were born in 1957 or later. You may also need a second dose. Pneumococcal conjugate (PCV13) vaccine  You may need this if you have certain conditions and were not previously vaccinated. Pneumococcal polysaccharide (PPSV23) vaccine  You may need one or two doses if you smoke cigarettes or if you have certain conditions. Meningococcal conjugate (MenACWY) vaccine  You may need this if you  have certain conditions. Hepatitis A vaccine  You may need this if you have certain conditions or if you travel or work in places where you may be exposed to hepatitis A. Hepatitis B vaccine  You may need this if you have certain conditions or if you travel or work in places where you may be exposed to hepatitis B. Haemophilus influenzae type b (Hib) vaccine  You may need this if you have certain conditions.  You may receive vaccines as individual doses or as more than one vaccine together in one shot (combination vaccines). Talk with your health care provider about the risks and benefits of combination vaccines. What tests do I need? Blood tests  Lipid and cholesterol levels. These may be checked every 5 years, or more frequently if you are over 42 years old.  Hepatitis C test.  Hepatitis B test. Screening  Lung cancer screening. You may have this screening every year starting at age 65 if you have a 30-pack-year history of smoking and currently smoke or have quit within the past 15 years.  Colorectal cancer screening. All adults should have this screening starting at age 20 and continuing until age 91. Your health care provider may recommend screening at age 65 if you are at increased risk. You will have tests every 1-10 years, depending on your results and the type of screening test.  Diabetes screening. This is done by checking your blood sugar (glucose) after you have not eaten for a while (fasting). You may have this done every 1-3 years.  Mammogram. This may be done every 1-2 years. Talk with your health care provider  about when you should start having regular mammograms. This may depend on whether you have a family history of breast cancer.  BRCA-related cancer screening. This may be done if you have a family history of breast, ovarian, tubal, or peritoneal cancers.  Pelvic exam.This may be done every 3 years. Other tests  Sexually transmitted disease (STD) testing.  Bone  density scan. This is done to screen for osteoporosis. You may have this scan if you are at high risk for osteoporosis. Follow these instructions at home: Eating and drinking  Eat a diet that includes fresh fruits and vegetables, whole grains, lean protein, and low-fat dairy.  Take vitamin and mineral supplements as recommended by your health care provider.  Do not drink alcohol if: ? Your health care provider tells you not to drink.  If you drink alcohol: ? Limit how much you have to 0-1 drink a day. ? Be aware of how much alcohol is in your drink. In the [place] one drink equals one 12 oz bottle of beer (355 mL), one 5 oz glass of wine (148 mL), or one 1 oz glass of hard liquor (44 mL). Lifestyle  Take daily care of your teeth and gums.  Stay active. Exercise for at least 30 minutes on 5 or more days each week.  Do not use any products that contain nicotine or tobacco, such as cigarettes, e-cigarettes, and chewing tobacco. If you need help quitting, ask your health care provider.  If you are sexually active, practice safe sex. Use a condom or other form of birth control (contraception) in order to prevent pregnancy and STIs (sexually transmitted infections).  If told by your health care provider, take low-dose aspirin daily starting at age 68. What's next?  Visit your health care provider once a year for a well check visit.  Ask your health care provider how often you should have your eyes and teeth checked.  Stay up to date on all vaccines. This information is not intended to replace advice given to you by your health care provider. Make sure you discuss any questions you have with your health care provider. Document Released: 06/19/2015 Document Revised: 02/01/2018 Document Reviewed: 02/01/2018 Elsevier Patient Education  2020 [employer].

## 2019-04-19 LAB — HEPATITIS B SURFACE ANTIGEN: Hepatitis B Surface Ag: NEGATIVE

## 2019-04-19 LAB — RPR: RPR Ser Ql: NONREACTIVE

## 2019-04-19 LAB — HIV ANTIBODY (ROUTINE TESTING W REFLEX): HIV Screen 4th Generation wRfx: NONREACTIVE

## 2019-04-19 LAB — HEPATITIS C ANTIBODY: Hep C Virus Ab: <0.1 s/co ratio (ref 0.0–0.9)

## 2019-04-22 ENCOUNTER — Telehealth: Payer: Self-pay | Admitting: Nutrition

## 2019-04-22 LAB — CERVICOVAGINAL ANCILLARY ONLY
Bacterial Vaginitis (gardnerella): NEGATIVE
Candida Glabrata: NEGATIVE
Candida Vaginitis: NEGATIVE
Chlamydia: NEGATIVE
Comment: NEGATIVE
Comment: NEGATIVE
Comment: NEGATIVE
Comment: NEGATIVE
Comment: NEGATIVE
Comment: NORMAL
Neisseria Gonorrhea: NEGATIVE
Trichomonas: NEGATIVE

## 2019-04-22 NOTE — Telephone Encounter (Signed)
Discussed ways she can decide if she would like this device.  I can show her the device here or on line, we can do an insurance verification to know the cost first, or, she can do a free 6 day trial.   She chose the insurance verification first, with permission from her to sign her name for the company to check her insurance.  She was given my number to my desk to call me once she hears back from them, and if she wants to continue trial of this.  She reported good understanding of this and reverbalized my telephone number. She was encouraged to test her blood sugar  Around her largest meal, both before and 2 hours later, with the goal of that reading of less than 180.  She reported good understanding of this.

## 2019-05-01 ENCOUNTER — Encounter: Payer: Self-pay | Admitting: *Deleted

## 2019-05-07 ENCOUNTER — Other Ambulatory Visit: Payer: Self-pay

## 2019-05-07 ENCOUNTER — Other Ambulatory Visit: Payer: Self-pay | Admitting: Internal Medicine

## 2019-05-07 ENCOUNTER — Encounter: Payer: 59 | Attending: Endocrinology | Admitting: Nutrition

## 2019-05-07 ENCOUNTER — Encounter: Payer: Self-pay | Admitting: Endocrinology

## 2019-05-07 DIAGNOSIS — R5382 Chronic fatigue, unspecified: Secondary | ICD-10-CM | POA: Diagnosis not present

## 2019-05-07 DIAGNOSIS — E1165 Type 2 diabetes mellitus with hyperglycemia: Secondary | ICD-10-CM | POA: Insufficient documentation

## 2019-05-07 DIAGNOSIS — Z794 Long term (current) use of insulin: Secondary | ICD-10-CM

## 2019-05-07 DIAGNOSIS — E119 Type 2 diabetes mellitus without complications: Secondary | ICD-10-CM

## 2019-05-07 MED FILL — METOPROLOL SUCCINATE ER 25: 25 | 90 days supply | Qty: 45 | Fill #1

## 2019-05-07 MED FILL — PHENTERMINE 37.5 MG TABLET: 37.5 | 30 days supply | Qty: 30 | Fill #0

## 2019-05-07 NOTE — Patient Instructions (Signed)
Read over information given.  Call me if you want to proceed with the pump, after speaking with [employer].

## 2019-05-07 NOTE — Progress Notes (Signed)
Patient was shown the insulin pumps.  We discussed how they deliver insulin, and the advantages and disadvantages of each model.  She chose the [employer], because she does not want to test / calibrate blood sugar readings.  She signed the paperwork and it was faxed to [employer].  She will let me know if she can afford this.

## 2019-05-08 ENCOUNTER — Other Ambulatory Visit: Payer: Self-pay

## 2019-05-08 DIAGNOSIS — E1165 Type 2 diabetes mellitus with hyperglycemia: Secondary | ICD-10-CM

## 2019-05-08 DIAGNOSIS — Z794 Long term (current) use of insulin: Secondary | ICD-10-CM

## 2019-05-08 DIAGNOSIS — E119 Type 2 diabetes mellitus without complications: Secondary | ICD-10-CM

## 2019-05-08 MED ORDER — METFORMIN HCL ER 500 MG PO TB24: 1000.0000 mg | ORAL_TABLET | Freq: Two times a day (BID) | ORAL | 2 refills | Status: DC

## 2019-05-08 MED ORDER — TRESIBA FLEXTOUCH 100 UNIT/ML ~~LOC~~ SOPN: 45.0000 [IU] | PEN_INJECTOR | Freq: Every day | SUBCUTANEOUS | 2 refills | Status: DC

## 2019-05-08 MED ORDER — PEN NEEDLES 32G X 5 MM MISC: 1.0000 "pen " | Freq: Every day | 2 refills | Status: DC

## 2019-05-08 MED FILL — UNIFINE PENTIPS 32GX5/32: 32G X 4 MM | 90 days supply | Qty: 100 | Fill #0

## 2019-05-08 MED FILL — UNIFINE PENTIPS 32GX5/32": 32G X 4 MM | 90 days supply | Qty: 100 | Fill #0

## 2019-05-08 MED FILL — TRESIBA FLEXTOUCH 100 UNITS: 100 | 33 days supply | Qty: 15 | Fill #0

## 2019-05-09 MED FILL — AJOVY 225 MG/1.5ML SOAJ: 225 | 90 days supply | Qty: 5 | Fill #0

## 2019-05-16 ENCOUNTER — Telehealth: Payer: Self-pay

## 2019-05-16 NOTE — Telephone Encounter (Signed)
Company: [employer]  Document: PA review for pump Other records requested: None  All above requested information has been faxed successfully to [employer] listed above. Documents and fax confirmation have been placed in the faxed file for future reference.

## 2019-05-20 NOTE — Telephone Encounter (Signed)
Received notification from [employer] that PA for insulin pump has been denied. Following reasons provided: Did not meet our guidelines for the requested device. Documents have been labeled and placed in scan file for HIM and for our future reference.

## 2019-05-21 ENCOUNTER — Ambulatory Visit
Admission: RE | Admit: 2019-05-21 | Discharge: 2019-05-21 | Disposition: A | Discharge status: Home / Self Care | Payer: 59 | Source: Ambulatory Visit | Attending: Internal Medicine | Admitting: Internal Medicine

## 2019-05-21 ENCOUNTER — Other Ambulatory Visit: Payer: Self-pay

## 2019-05-21 DIAGNOSIS — Z1231 Encounter for screening mammogram for malignant neoplasm of breast: Secondary | ICD-10-CM

## 2019-05-21 MED FILL — METFORMIN HCL ER 500 MG TB2: 500 | 90 days supply | Qty: 360 | Fill #0

## 2019-05-21 MED FILL — AJOVY 225 MG/1.5ML SOAJ: 225 | 90 days supply | Qty: 5 | Fill #0

## 2019-05-23 ENCOUNTER — Other Ambulatory Visit: Payer: Self-pay | Admitting: Unknown Physician Specialty

## 2019-05-23 ENCOUNTER — Telehealth: Payer: Self-pay | Admitting: Unknown Physician Specialty

## 2019-05-23 ENCOUNTER — Encounter: Payer: Self-pay | Admitting: Unknown Physician Specialty

## 2019-05-23 ENCOUNTER — Ambulatory Visit (INDEPENDENT_AMBULATORY_CARE_PROVIDER_SITE_OTHER)
Admission: RE | Admit: 2019-05-23 | Discharge: 2019-05-23 | Disposition: A | Discharge status: Home / Self Care | Payer: 59 | Source: Ambulatory Visit

## 2019-05-23 DIAGNOSIS — U071 COVID-19: Secondary | ICD-10-CM | POA: Diagnosis not present

## 2019-05-23 DIAGNOSIS — J4521 Mild intermittent asthma with (acute) exacerbation: Secondary | ICD-10-CM | POA: Diagnosis not present

## 2019-05-23 DIAGNOSIS — R05 Cough: Secondary | ICD-10-CM | POA: Diagnosis not present

## 2019-05-23 DIAGNOSIS — R059 Cough, unspecified: Secondary | ICD-10-CM

## 2019-05-23 MED ORDER — BENZONATATE 100 MG PO CAPS: 100.0000 mg | ORAL_CAPSULE | Freq: Three times a day (TID) | ORAL | 0 refills | Status: DC

## 2019-05-23 MED ORDER — ALBUTEROL SULFATE HFA 108 (90 BASE) MCG/ACT IN AERS: 2.0000 | INHALATION_SPRAY | Freq: Four times a day (QID) | RESPIRATORY_TRACT | 0 refills | Status: DC | PRN

## 2019-05-23 MED ORDER — PREDNISONE 20 MG PO TABS: 20.0000 mg | ORAL_TABLET | Freq: Two times a day (BID) | ORAL | 0 refills | Status: AC

## 2019-05-23 MED FILL — predniSONE 20 MG TABS: 20 | 5 days supply | Qty: 10 | Fill #0

## 2019-05-23 MED FILL — BENZONATATE 100 MG CAPS: 100 | 7 days supply | Qty: 21 | Fill #0

## 2019-05-23 MED FILL — ALBUTEROL SULFATE HFA 108 (: 108 (90 BAS | 25 days supply | Qty: 18 | Fill #0

## 2019-05-23 NOTE — Discharge Instructions (Signed)
COVID test was positive You should remain isolated in your home for 10 days from symptom onset AND greater than 72 hours after symptoms resolution (absence of fever without the use of fever-reducing medication and improvement in respiratory symptoms), whichever is longer Get plenty of rest and push fluids Prednisone prescribed for possible asthma flare secondary to COVID diagnosis Albuterol inhaler sent in.  Use as needed for shortness of breath and/or wheezing Tessalon perles for cough as needed Use zyrtec for nasal congestion, runny nose, and/or sore throat Use flonase for nasal congestion and runny nose Use OTC medications like ibuprofen or tylenol as needed fever or pain Follow up with PCP in 1-2 days via phone or e-visit for recheck and to ensure symptoms are improving Call or go to the ED if you have any new or worsening symptoms such as fever, worsening cough, shortness of breath, chest tightness, chest pain, turning blue, changes in mental status, etc..[name]

## 2019-05-23 NOTE — Telephone Encounter (Signed)
  I connected by phone with [name] on 05/23/2019 at 10:02 AM to discuss the potential use of an new treatment for mild to moderate COVID-19 viral infection in non-hospitalized patients.  This patient is a 48 y.o. female that meets the FDA criteria for Emergency Use Authorization of bamlanivimab or casirivimab\imdevimab.  Has a (+) direct SARS-CoV-2 viral test result  Has mild or moderate COVID-19   Is ? 49 years of age and weighs ? 40 kg  Is NOT hospitalized due to COVID-19  Is NOT requiring oxygen therapy or requiring an increase in baseline oxygen flow rate due to COVID-19  Is within 10 days of symptom onset  Has at least one of the high risk factor(s) for progression to severe COVID-19 and/or hospitalization as defined in EUA.  Specific high risk criteria : Diabetes   I have spoken and communicated the following to the patient or parent/caregiver:  1. FDA has authorized the emergency use of bamlanivimab and casirivimab\imdevimab for the treatment of mild to moderate COVID-19 in adults and pediatric patients with positive results of direct SARS-CoV-2 viral testing who are 13 years of age and older weighing at least 40 kg, and who are at high risk for progressing to severe COVID-19 and/or hospitalization.  2. The significant known and potential risks and benefits of bamlanivimab and casirivimab\imdevimab, and the extent to which such potential risks and benefits are unknown.  3. Information on available alternative treatments and the risks and benefits of those alternatives, including clinical trials.  4. Patients treated with bamlanivimab and casirivimab\imdevimab should continue to self-isolate and use infection control measures (e.g., wear mask, isolate, social distance, avoid sharing personal items, clean and disinfect "high touch" surfaces, and frequent handwashing) according to CDC guidelines.   5. The patient or parent/caregiver has the option to accept or refuse  bamlanivimab or casirivimab\imdevimab .  After reviewing this information with the patient, The patient agreed to proceed with receiving the bamlanimivab infusion and will be provided a copy of the Fact sheet prior to receiving the infusion.[name] 05/23/2019 10:02 AM

## 2019-05-23 NOTE — ED Provider Notes (Signed)
[place]    Virtual Visit via Video Note:  [name]  initiated request for Telemedicine visit with [facility] team. I connected with [name]  on 05/23/2019 at 10:36 AM  for a synchronized telemedicine visit using a video enabled HIPPA compliant telemedicine application. I verified that I am speaking with [name]  using two identifiers. Lestine Box, PA-C  was physically located in a [facility] Urgent care site and [name] was located at a different location.   The limitations of evaluation and management by telemedicine as well as the availability of in-person appointments were discussed. Patient was informed that she  may incur a bill ( including co-pay) for this virtual visit encounter. [name]  expressed understanding and gave verbal consent to proceed with virtual visit.  [id number] 05/23/19 Arrival Time: 54  Cc: COVID 19 infection  SUBJECTIVE:  [name] is a 48 y.o. female hx of asthma, who was diagnosed with COVID-19 on 05/22/2019.  Complains of productive cough, tickle in throat, fatigue, fever of 101.9 last night, headache, chest tightness, SOB, and 2 episodes of watery diarrhea this morning, that began 2 days ago.  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  Has tried migraine medication with temporary relief of symptoms.  Symptoms are made worse with air freshener.  Reports previous symptoms in the past.   Denies rhinorrhea, wheezing, chest pain, nausea, vomiting, changes in bladder habits.    ROS: As per HPI.  All other pertinent ROS negative.     Past Medical History:  Diagnosis Date  . Allergy   . Anemia   . Anxiety    claustrophobic  . Asthma   . Back pain   . Biceps tendonosis of right shoulder   . Diabetes mellitus 2011   did not start metforfin, losing weight  . Dyspnea   . Fatty liver   . Food allergy   . Headache disorder   . History of concussion   . HLD (hyperlipidemia)   .  Hypertension   . IBS (irritable bowel syndrome)   . Infertility, female   . Joint pain   . Lactose intolerance   . Leg edema   . Migraines   . Other specified disorders of thyroid   . Palpitation   . Post-menopausal   . Seborrheic dermatitis   . Shoulder impingement syndrome, right   . Vitamin D deficiency    Past Surgical History:  Procedure Laterality Date  . ABDOMINAL HYSTERECTOMY  2006   heavy menses, endometriosis, l oophrectomy  . BACK SURGERY    . BREAST BIOPSY Right 2018   benign  . BREAST EXCISIONAL BIOPSY Right 2003   benign  . BREAST EXCISIONAL BIOPSY Right 1999   benign  . BREAST SURGERY     right breast x 2 , benign  . HERNIA REPAIR  2003   left inguinal   . LEFT OOPHORECTOMY    . SHOULDER ARTHROSCOPY WITH SUBACROMIAL DECOMPRESSION AND OPEN ROTATOR C Right 07/14/2016   Procedure: RIGHT SHOULDER ARTHROSCOPY WITH SUBACROMIAL DECOMPRESSION, DISTAL CLAVICLE RESECTION AND MINI OPEN ROTATOR CUFF REPAIR, OPEN BICEP TENDODESIS;  Surgeon: Garald Balding, MD;  Location: [place];  Service: Orthopedics;  Laterality: Right;  . SHOULDER CLOSED REDUCTION Right 09/08/2016   Procedure: RIGHT CLOSED MANIPULATION SHOULDER;  Surgeon: Garald Balding, MD;  Location: [place];  Service: Orthopedics;  Laterality: Right;  . TUBAL LIGATION  Allergies  Allergen Reactions  . Botox [Onabotulinumtoxina] Shortness Of Breath    syncope  . Clindamycin/Lincomycin Other (See Comments)  . Kiwi Extract Shortness Of Breath  . Maxalt [Rizatriptan Benzoate] Anaphylaxis    Chest pain  . Nitrous Oxide Shortness Of Breath    syncope  . Triptans Shortness Of Breath  . Aspirin Nausea And Vomiting    Mouth blisters  . Contrast Media [Iodinated Diagnostic Agents]   . Flagyl [Metronidazole]   . Hydrocodone-Acetaminophen Other (See Comments)  . Latex     Sometimes causes rash  . Mango Flavor   . Rizatriptan Other (See Comments)  . Vicodin  [Hydrocodone-Acetaminophen]     hallucinations  . Flu Virus Vaccine Palpitations  . Penicillins Nausea And Vomiting, Rash and Other (See Comments)  . Tetracyclines & Related Nausea And Vomiting and Rash   No current facility-administered medications on file prior to encounter.   Current Outpatient Medications on File Prior to Encounter  Medication Sig Dispense Refill  . ALPRAZolam (XANAX) 0.5 MG tablet Take 1 tablet (0.5 mg total) by mouth 3 (three) times daily as needed for anxiety. 20 tablet 0  . aspirin EC 81 MG tablet Take 81 mg by mouth daily.    [name] atorvastatin (LIPITOR) 20 MG tablet TAKE 1 TABLET BY MOUTH DAILY. 90 tablet 3  . Blood Glucose Monitoring Suppl (FREESTYLE LITE) DEVI 1 Device by Does not apply route 2 (two) times daily. Please use [employer] if insurance covers for meter, lancets and strips 1 each 0  . butalbital-acetaminophen-caffeine (FIORICET) 50-325-40 MG tablet Take 1 tablet by mouth every 6 (six) hours as needed for headache. Do not refill in less than 30 day 30 tablet 1  . diazepam (VALIUM) 2 MG tablet Take 2 mg by mouth every 6 (six) hours as needed for anxiety.    [name] diltiazem (CARDIZEM LA) 240 MG 24 hr tablet TAKE 1 TABLET BY MOUTH DAILY 90 tablet 1  . EPINEPHrine 0.3 mg/0.3 mL IJ SOAJ injection Inject 0.3 mg into the muscle as needed (for allergic reaction).   3  . Fremanezumab-vfrm (AJOVY) 225 MG/1.5ML SOAJ Inject 675 mg into the skin every 3 (three) months. 3 pen 3  . glucose blood (FREESTYLE TEST STRIPS) test strip Use as instructed 100 each 2  . insulin degludec (TRESIBA FLEXTOUCH) 100 UNIT/ML SOPN FlexTouch Pen Inject 0.45 mLs (45 Units total) into the skin daily. 41 mL 2  . Insulin Pen Needle (PEN NEEDLES) 32G X 5 MM MISC 1 pen by Does not apply route daily. Use to inject insulin daily; E11.9 90 each 2  . ketorolac (TORADOL) 10 MG tablet Take 1 tablet (10 mg total) by mouth every 6 (six) hours as needed. 30 tablet 1  . Lancets (FREESTYLE) lancets Use as  instructed 100 each 2  . losartan-hydrochlorothiazide (HYZAAR) 50-12.5 MG tablet TAKE 1 TABLET BY MOUTH DAILY 90 tablet 2  . meclizine (ANTIVERT) 25 MG tablet Take 25 mg by mouth 3 (three) times daily as needed for dizziness.    . metFORMIN (GLUCOPHAGE-XR) 500 MG 24 hr tablet Take 2 tablets (1,000 mg total) by mouth 2 (two) times daily. 360 tablet 2  . metoprolol succinate (TOPROL-XL) 25 MG 24 hr tablet Take 0.5 tablets (12.5 mg total) by mouth daily. 45 tablet 3  . ondansetron (ZOFRAN ODT) 4 MG disintegrating tablet Take 1 tablet (4 mg total) by mouth every 8 (eight) hours as needed for nausea or vomiting. 20 tablet 0  . oxyCODONE-acetaminophen (ROXICET) 5-325 MG  tablet Take 1-2 tablets by mouth every 4 (four) hours as needed for severe pain. 40 tablet 0  . phentermine 37.5 MG capsule Take 37.5 mg by mouth every morning.        OBJECTIVE:  There were no vitals filed for this visit.   General appearance: alert; appears fatigued, but nontoxic Eyes: EOMI grossly HENT: normocephalic; atraumatic Neck: supple with FROM Lungs: normal respiratory effort; speaking in full sentences without difficulty, mild cough present Extremities: moves extremities without difficulty Skin: No obvious rashes Neurologic: No facial asymmetries Psychological: alert and cooperative; normal mood and affect   ASSESSMENT & PLAN:  1. COVID-19 virus infection   2. Cough   3. Mild intermittent asthma with exacerbation     Meds ordered this encounter  Medications  . albuterol (VENTOLIN HFA) 108 (90 Base) MCG/ACT inhaler    Sig: Inhale 2 puffs into the lungs every 6 (six) hours as needed for wheezing or shortness of breath.    Dispense:  18 g    Refill:  0    Order Specific Question:   Supervising Provider    Answer:   Raylene Everts [id number]  . benzonatate (TESSALON) 100 MG capsule    Sig: Take 1 capsule (100 mg total) by mouth every 8 (eight) hours.    Dispense:  21 capsule    Refill:  0    Order  Specific Question:   Supervising Provider    Answer:   Raylene Everts [id number]  . predniSONE (DELTASONE) 20 MG tablet    Sig: Take 1 tablet (20 mg total) by mouth 2 (two) times daily with a meal for 5 days.    Dispense:  10 tablet    Refill:  0    Order Specific Question:   Supervising Provider    Answer:   Raylene Everts [id number]    No orders of the defined types were placed in this encounter.    COVID test was positive You should remain isolated in your home for 10 days from symptom onset AND greater than 72 hours after symptoms resolution (absence of fever without the use of fever-reducing medication and improvement in respiratory symptoms), whichever is longer Get plenty of rest and push fluids Prednisone prescribed for possible asthma flare secondary to COVID diagnosis Albuterol inhaler sent in.  Use as needed for shortness of breath and/or wheezing Tessalon perles for cough as needed Use zyrtec for nasal congestion, runny nose, and/or sore throat Use flonase for nasal congestion and runny nose Use OTC medications like ibuprofen or tylenol as needed fever or pain Follow up with PCP in 1-2 days via phone or e-visit for recheck and to ensure symptoms are improving Call or go to the ED if you have any new or worsening symptoms such as fever, worsening cough, shortness of breath, chest tightness, chest pain, turning blue, changes in mental status, etc...    I discussed the assessment and treatment plan with the patient. The patient was provided an opportunity to ask questions and all were answered. The patient agreed with the plan and demonstrated an understanding of the instructions.   The patient was advised to call back or seek an in-person evaluation if the symptoms worsen or if the condition fails to improve as anticipated.  I provided 15 minutes of non-face-to-face time during this encounter.  Lestine Box, PA-C  05/23/2019 10:36 AM           Lestine Box, PA-C 05/23/19 1037

## 2019-05-24 ENCOUNTER — Emergency Department (HOSPITAL_COMMUNITY)
Admission: EM | Admit: 2019-05-24 | Discharge: 2019-05-24 | Disposition: A | Discharge status: Home / Self Care | Payer: 59 | Attending: Emergency Medicine | Admitting: Emergency Medicine

## 2019-05-24 ENCOUNTER — Ambulatory Visit (HOSPITAL_COMMUNITY)
Admission: RE | Admit: 2019-05-24 | Discharge: 2019-05-24 | Disposition: A | Discharge status: Home / Self Care | Payer: 59 | Source: Ambulatory Visit | Attending: Pulmonary Disease | Admitting: Pulmonary Disease

## 2019-05-24 ENCOUNTER — Telehealth: Payer: Self-pay | Admitting: Unknown Physician Specialty

## 2019-05-24 ENCOUNTER — Other Ambulatory Visit: Payer: Self-pay

## 2019-05-24 DIAGNOSIS — L509 Urticaria, unspecified: Secondary | ICD-10-CM | POA: Diagnosis not present

## 2019-05-24 DIAGNOSIS — U071 COVID-19: Secondary | ICD-10-CM

## 2019-05-24 DIAGNOSIS — R0981 Nasal congestion: Secondary | ICD-10-CM | POA: Diagnosis present

## 2019-05-24 DIAGNOSIS — Z79899 Other long term (current) drug therapy: Secondary | ICD-10-CM | POA: Diagnosis not present

## 2019-05-24 DIAGNOSIS — I10 Essential (primary) hypertension: Secondary | ICD-10-CM | POA: Insufficient documentation

## 2019-05-24 DIAGNOSIS — J45909 Unspecified asthma, uncomplicated: Secondary | ICD-10-CM | POA: Insufficient documentation

## 2019-05-24 DIAGNOSIS — R0789 Other chest pain: Secondary | ICD-10-CM | POA: Diagnosis not present

## 2019-05-24 DIAGNOSIS — T782XXA Anaphylactic shock, unspecified, initial encounter: Secondary | ICD-10-CM | POA: Diagnosis not present

## 2019-05-24 DIAGNOSIS — Z7982 Long term (current) use of aspirin: Secondary | ICD-10-CM | POA: Insufficient documentation

## 2019-05-24 DIAGNOSIS — R079 Chest pain, unspecified: Secondary | ICD-10-CM | POA: Diagnosis not present

## 2019-05-24 DIAGNOSIS — Z91040 Latex allergy status: Secondary | ICD-10-CM | POA: Insufficient documentation

## 2019-05-24 DIAGNOSIS — R0602 Shortness of breath: Secondary | ICD-10-CM | POA: Diagnosis not present

## 2019-05-24 MED ORDER — ALBUTEROL SULFATE HFA 108 (90 BASE) MCG/ACT IN AERS: 2.0000 | INHALATION_SPRAY | Freq: Once | RESPIRATORY_TRACT | Status: AC | PRN

## 2019-05-24 MED ORDER — METHYLPREDNISOLONE SODIUM SUCC 125 MG IJ SOLR
INTRAMUSCULAR | Status: AC
  Filled 2019-05-24: qty 2

## 2019-05-24 MED ORDER — FAMOTIDINE IN NACL 20-0.9 MG/50ML-% IV SOLN
INTRAVENOUS | Status: AC
  Administered 2019-05-24: 10:00:00 20 mg via INTRAVENOUS
  Filled 2019-05-24: qty 50

## 2019-05-24 MED ORDER — EPINEPHRINE 0.3 MG/0.3ML IJ SOAJ
INTRAMUSCULAR | Status: AC
  Filled 2019-05-24: qty 0.3

## 2019-05-24 MED ORDER — METHYLPREDNISOLONE SODIUM SUCC 125 MG IJ SOLR
125.0000 mg | Freq: Once | INTRAMUSCULAR | Status: AC | PRN
  Administered 2019-05-24: 10:00:00 125 mg via INTRAVENOUS

## 2019-05-24 MED ORDER — SODIUM CHLORIDE 0.9 % IV SOLN
INTRAVENOUS | Status: DC | PRN
  Administered 2019-05-24: 09:00:00 250 mL via INTRAVENOUS

## 2019-05-24 MED ORDER — FAMOTIDINE IN NACL 20-0.9 MG/50ML-% IV SOLN: 20.0000 mg | Freq: Once | INTRAVENOUS | Status: DC | PRN

## 2019-05-24 MED ORDER — SODIUM CHLORIDE 0.9 % IV SOLN
700.0000 mg | Freq: Once | INTRAVENOUS | Status: AC
  Administered 2019-05-24: 700 mg via INTRAVENOUS
  Filled 2019-05-24: qty 20

## 2019-05-24 MED ORDER — DIPHENHYDRAMINE HCL 50 MG/ML IJ SOLN: 50.0000 mg | Freq: Once | INTRAMUSCULAR | Status: AC | PRN

## 2019-05-24 MED ORDER — EPINEPHRINE 0.3 MG/0.3ML IJ SOAJ
0.3000 mg | Freq: Once | INTRAMUSCULAR | Status: AC | PRN
  Administered 2019-05-24: 10:00:00 0.3 mg via INTRAMUSCULAR

## 2019-05-24 MED ORDER — DIPHENHYDRAMINE HCL 50 MG/ML IJ SOLN
INTRAMUSCULAR | Status: AC
  Administered 2019-05-24: 10:00:00 50 mg via INTRAVENOUS
  Filled 2019-05-24: qty 1

## 2019-05-24 MED ORDER — EPINEPHRINE 0.3 MG/0.3ML IJ SOAJ: 0.3000 mg | INTRAMUSCULAR | 3 refills | Status: DC | PRN

## 2019-05-24 MED ORDER — ALBUTEROL SULFATE HFA 108 (90 BASE) MCG/ACT IN AERS
INHALATION_SPRAY | RESPIRATORY_TRACT | Status: AC
  Administered 2019-05-24: 10:00:00 2 via RESPIRATORY_TRACT
  Filled 2019-05-24: qty 6.7

## 2019-05-24 MED FILL — EPINEPHRINE 0.3 MG AUTO-INJ: 0.3 | 2 days supply | Qty: 2 | Fill #0

## 2019-05-24 NOTE — ED Provider Notes (Signed)
[place] DEPT Provider Note   CSN: [id number] Arrival date & time: 05/24/19  1057     History Chief Complaint  Patient presents with  . COVID +  . Allergic Reaction    [name] is a 48 y.o. female.  HPI    Patient presents after possible allergic reaction. She notes that she was generally well until 3 days ago. At that point she developed nasal congestion, mild discomfort. The following day she was tested for coronavirus, and this result was positive. After discussing her results with her care team, acknowledging her history of multiple medical issues including asthma, she was sent for Covid antibody infusion today. She notes that slightly after half of the infusion she developed chest tightness, dyspnea, lightheadedness, nausea. Subsequently she received Solu-Medrol, Benadryl, Pepcid, EpiPen. She currently complains only of tightness/sharp discomfort in her sternum, which is mild. No fever, no nausea, no vomiting, no confusion, disorientation.  Past Medical History:  Diagnosis Date  . Allergy   . Anemia   . Anxiety    claustrophobic  . Asthma   . Back pain   . Biceps tendonosis of right shoulder   . Diabetes mellitus 2011   did not start metforfin, losing weight  . Dyspnea   . Fatty liver   . Food allergy   . Headache disorder   . History of concussion   . HLD (hyperlipidemia)   . Hypertension   . IBS (irritable bowel syndrome)   . Infertility, female   . Joint pain   . Lactose intolerance   . Leg edema   . Migraines   . Other specified disorders of thyroid   . Palpitation   . Post-menopausal   . Seborrheic dermatitis   . Shoulder impingement syndrome, right   . Vitamin D deficiency     Patient Active Problem List   Diagnosis Date Noted  . Acute lower respiratory infection 06/04/2018  . Other fatigue 01/23/2018  . Shortness of breath on exertion 01/23/2018  . Multinodular goiter 12/05/2017  . Multiple  thyroid nodules 12/05/2017  . Microalbuminuria due to type 2 diabetes mellitus ([place]) 11/26/2017  . Pharyngitis 11/26/2017  . Encounter for preventive health examination 11/26/2017  . Abnormal TSH 11/26/2017  . Somnolence, daytime 11/14/2017  . Menopause syndrome 06/10/2017  . Adverse reaction to vaccine, sequela 03/25/2017  . Atypical chest pain 02/27/2017  . Palpitations 02/27/2017  . PAC (premature atrial contraction) 02/27/2017  . PVC's (premature ventricular contractions) 02/27/2017  . PSVT (paroxysmal supraventricular tachycardia) ([place]) 11/26/2016  . History of palpitations 11/08/2016  . Nontraumatic incomplete tear of right rotator cuff 07/14/2016  . AC (acromioclavicular) arthritis 07/14/2016  . Impingement syndrome of right shoulder 07/14/2016  . Fibrocystic breast changes, right 05/21/2016  . Menopausal symptoms 10/30/2015  . Insomnia 10/16/2015  . Snoring 07/26/2015  . Migraine without aura and without status migrainosus, not intractable 02/24/2015  . Muscle spasm 02/24/2015  . Facial paresthesia 02/24/2015  . History of concussion 07/21/2014  . Hyperlipidemia associated with type 2 diabetes mellitus ([place]) 06/13/2014  . Pain in joint, shoulder region 08/11/2013  . Vitamin D deficiency 05/07/2013  . Chronic migraine 11/20/2012  . S/P Total Abdominal Hysterectomy and Left Salpingo-oophorectomy 10/20/2011  . Headache disorder   . Uncontrolled type 2 diabetes mellitus ([place]) 07/20/2011  . Obesity (BMI 30-39.9) 07/20/2011  . Essential hypertension 07/20/2011    Past Surgical History:  Procedure Laterality Date  . ABDOMINAL HYSTERECTOMY  2006   heavy menses, endometriosis, l  oophrectomy  . BACK SURGERY    . BREAST BIOPSY Right 2018   benign  . BREAST EXCISIONAL BIOPSY Right 2003   benign  . BREAST EXCISIONAL BIOPSY Right 1999   benign  . BREAST SURGERY     right breast x 2 , benign  . HERNIA REPAIR  2003   left inguinal   . LEFT OOPHORECTOMY    . SHOULDER  ARTHROSCOPY WITH SUBACROMIAL DECOMPRESSION AND OPEN ROTATOR C Right 07/14/2016   Procedure: RIGHT SHOULDER ARTHROSCOPY WITH SUBACROMIAL DECOMPRESSION, DISTAL CLAVICLE RESECTION AND MINI OPEN ROTATOR CUFF REPAIR, OPEN BICEP TENDODESIS;  Surgeon: Garald Balding, MD;  Location: [place];  Service: Orthopedics;  Laterality: Right;  . SHOULDER CLOSED REDUCTION Right 09/08/2016   Procedure: RIGHT CLOSED MANIPULATION SHOULDER;  Surgeon: Garald Balding, MD;  Location: [place];  Service: Orthopedics;  Laterality: Right;  . TUBAL LIGATION       OB History    Gravida  3   Para  2   Term  2   Preterm  0   AB  1   Living  0     SAB  0   TAB  0   Ectopic  1   Multiple  0   Live Births  0           Family History  Problem Relation Age of Onset  . Diabetes Mother   . Heart disease Mother 7  . Hypertension Mother   . Hyperlipidemia Mother   . Heart failure Mother   . Stroke Mother   . Thyroid disease Mother   . Depression Mother   . Sleep apnea Mother   . Obesity Mother   . Cancer Father 49       Lung Cancer  . Heart disease Father   . Mental illness Sister        bipolar, substance abuse,  clean 2 yrs  . Hypertension Sister   . Cancer Paternal Grandmother 66       breast cancer  . Breast cancer Paternal Grandmother   . Diabetes Maternal Grandmother   . Hypertension Maternal Grandmother   . Diabetes Son     Social History   Tobacco Use  . Smoking status: Never Smoker  . Smokeless tobacco: Never Used  Substance Use Topics  . Alcohol use: No  . Drug use: No    Home Medications Prior to Admission medications   Medication Sig Start Date End Date Taking? Authorizing Provider  albuterol (VENTOLIN HFA) 108 (90 Base) MCG/ACT inhaler Inhale 2 puffs into the lungs every 6 (six) hours as needed for wheezing or shortness of breath. 05/23/19  Yes Wurst, [place], PA-C  ALPRAZolam (XANAX) 0.5 MG tablet Take 1 tablet (0.5 mg total) by  mouth 3 (three) times daily as needed for anxiety. Patient taking differently: Take 0.5 mg by mouth 3 (three) times daily as needed (Headache).  09/28/18  Yes Teague Bobbye Morton, PA-C  aspirin EC 81 MG tablet Take 81 mg by mouth daily.   Yes [provider]  atorvastatin (LIPITOR) 20 MG tablet TAKE 1 TABLET BY MOUTH DAILY. Patient taking differently: Take 20 mg by mouth daily at 6 PM.  03/29/19  Yes Crecencio Mc, MD  butalbital-acetaminophen-caffeine (FIORICET) 50-325-40 MG tablet Take 1 tablet by mouth every 6 (six) hours as needed for headache. Do not refill in less than 30 day 09/28/18  Yes Teague Bobbye Morton, PA-C  diazepam (VALIUM) 2  MG tablet Take 2 mg by mouth every 6 (six) hours as needed (Headache).    Yes [provider]  diltiazem (CARDIZEM LA) 240 MG 24 hr tablet TAKE 1 TABLET BY MOUTH DAILY Patient taking differently: Take 240 mg by mouth daily.  02/20/19  Yes Crecencio Mc, MD  Fremanezumab-vfrm (AJOVY) 225 MG/1.5ML SOAJ Inject 675 mg into the skin every 3 (three) months. 03/22/19  Yes Teague Bobbye Morton, PA-C  insulin degludec (TRESIBA FLEXTOUCH) 100 UNIT/ML SOPN FlexTouch Pen Inject 0.45 mLs (45 Units total) into the skin daily. 05/08/19  Yes Renato Shin, MD  ketorolac (TORADOL) 10 MG tablet Take 1 tablet (10 mg total) by mouth every 6 (six) hours as needed. Patient taking differently: Take 10 mg by mouth every 6 (six) hours as needed (Headache).  02/16/16  Yes Anyanwu, Sallyanne Havers, MD  losartan-hydrochlorothiazide (HYZAAR) 50-12.5 MG tablet TAKE 1 TABLET BY MOUTH DAILY 06/28/18  Yes Einar Pheasant, MD  meclizine (ANTIVERT) 25 MG tablet Take 25 mg by mouth 3 (three) times daily as needed for dizziness.   Yes [provider]  metFORMIN (GLUCOPHAGE-XR) 500 MG 24 hr tablet Take 2 tablets (1,000 mg total) by mouth 2 (two) times daily. 05/08/19  Yes Renato Shin, MD  metoprolol succinate (TOPROL-XL) 25 MG 24 hr tablet Take 0.5 tablets (12.5 mg total) by  mouth daily. 09/20/18  Yes End, Harrell Gave, MD  ondansetron (ZOFRAN ODT) 4 MG disintegrating tablet Take 1 tablet (4 mg total) by mouth every 8 (eight) hours as needed for nausea or vomiting. 06/22/17  Yes Carrie Mew, MD  oxyCODONE-acetaminophen (ROXICET) 5-325 MG tablet Take 1-2 tablets by mouth every 4 (four) hours as needed for severe pain. 09/08/16  Yes Petrarca, Mike Craze, PA-C  phentermine 37.5 MG capsule Take 37.5 mg by mouth every morning.   Yes [provider]  benzonatate (TESSALON) 100 MG capsule Take 1 capsule (100 mg total) by mouth every 8 (eight) hours. 05/23/19   Wurst, [place], PA-C  Blood Glucose Monitoring Suppl (FREESTYLE LITE) DEVI 1 Device by Does not apply route 2 (two) times daily. Please use [employer] if insurance covers for meter, lancets and strips 01/23/18   Leafy Ro, Caren D, MD  EPINEPHrine 0.3 mg/0.3 mL IJ SOAJ injection Inject 0.3 mLs (0.3 mg total) into the muscle as needed (for allergic reaction). 05/24/19   Carmin Muskrat, MD  ergocalciferol (VITAMIN D2) 1.25 MG (50000 UT) capsule Take 50,000 Units by mouth once a week.    [provider]  glucose blood (FREESTYLE TEST STRIPS) test strip Use as instructed 10/30/18   Crecencio Mc, MD  Insulin Pen Needle (PEN NEEDLES) 32G X 5 MM MISC 1 pen by Does not apply route daily. Use to inject insulin daily; E11.9 05/08/19   Renato Shin, MD  Lancets (FREESTYLE) lancets Use as instructed 10/30/18   Crecencio Mc, MD  predniSONE (DELTASONE) 20 MG tablet Take 1 tablet (20 mg total) by mouth 2 (two) times daily with a meal for 5 days. 05/23/19 05/28/19  Wurst, Marye Round, PA-C    Allergies    Botox [onabotulinumtoxina], Clindamycin/lincomycin, Kiwi extract, Maxalt [rizatriptan benzoate], Nitrous oxide, Triptans, Aspirin, Contrast media [iodinated diagnostic agents], Flagyl [metronidazole], Hydrocodone-acetaminophen, Latex, Mango flavor, Rizatriptan, Vicodin [hydrocodone-acetaminophen], Flu virus vaccine,  Penicillins, and Tetracyclines & related  Review of Systems   Review of Systems  Constitutional:       Per HPI, otherwise negative  HENT:       Per HPI, otherwise negative  Respiratory:  Per HPI, otherwise negative  Cardiovascular:       Per HPI, otherwise negative  Gastrointestinal: Negative for vomiting.  Endocrine:       Negative aside from HPI  Genitourinary:       Neg aside from HPI   Musculoskeletal:       Per HPI, otherwise negative  Skin: Negative.   Neurological: Negative for syncope.    Physical Exam Updated Vital Signs BP 130/81   Pulse 96   Temp 99.1 F (37.3 C) (Oral)   Resp (!) 23   Ht 5\' 6"  (1.676 m)   Wt 98 kg   SpO2 95%   BMI 34.86 kg/m   Physical Exam Vitals and nursing note reviewed.  Constitutional:      General: She is not in acute distress.    Appearance: She is well-developed.  HENT:     Head: Normocephalic and atraumatic.  Eyes:     Conjunctiva/sclera: Conjunctivae normal.  Cardiovascular:     Rate and Rhythm: Normal rate and regular rhythm.  Pulmonary:     Effort: Pulmonary effort is normal. No respiratory distress.     Breath sounds: Normal breath sounds. No stridor.  Abdominal:     General: There is no distension.  Skin:    General: Skin is warm and dry.  Neurological:     Mental Status: She is alert and oriented to person, place, and time.     Cranial Nerves: No cranial nerve deficit.     ED Results / Procedures / Treatments    Procedures Procedures (including critical care time)  Medications Ordered in ED Medications - No data to display  ED Course  I have reviewed the triage vital signs and the nursing notes.  Pertinent labs & imaging results that were available during my care of the patient were reviewed by me and considered in my medical decision making (see chart for details).    MDM Rules/Calculators/A&P                      1:42 PM Patient awake, alert, no hypoxia, no distress, feels better aside  from headache.  She will take her own Excedrin, per her request.  This adult female presents after likely anaphylactic reaction following infusion of novel coronavirus antibodies.  Patient recovers after receiving epinephrine, steroids, Benadryl, Pepcid, and 4 hours of monitoring here had no evidence for decompensation, rebound phenomena. Without other complaints, the patient is discharged in stable condition. Final Clinical Impression(s) / ED Diagnoses Final diagnoses:  Anaphylaxis, initial encounter    Rx / DC Orders ED Discharge Orders         Ordered    EPINEPHrine 0.3 mg/0.3 mL IJ SOAJ injection  As needed     05/24/19 1339           Carmin Muskrat, MD 05/24/19 1342

## 2019-05-24 NOTE — ED Notes (Signed)
Pt requesting food and drink. MD aware.

## 2019-05-24 NOTE — Telephone Encounter (Signed)
Called patient r/e allergic reaction to infusion.  She is in the ER and feeling better.

## 2019-05-24 NOTE — Progress Notes (Signed)
Patient began having itching, bamlanivimab infusion stopped, normal saline infusion continued.  Patient treated with Benadryl 50mg  IV, Solumedrol 125mg  IV, 4 puffs ventolin inhaler, and 20mg  IV Pepcid.  Symptoms progressed to shortness of breath and chest pain - EpiPen 0.3 mg given,  Continues with chest pain rated 4 out of 10.  [facility] called for transport to emergency room.  Initial BP decrease to 0000000 systolic, back to baseline prior to departure for ED.  Oxygen saturation remained 100% throughout incident.

## 2019-05-24 NOTE — ED Notes (Signed)
Pt provided with drink to take home medication. Per MD this is fine.

## 2019-05-24 NOTE — ED Notes (Signed)
Pt awaiting ride at this time. Pt to call out when ride arrives.

## 2019-05-24 NOTE — ED Triage Notes (Signed)
Pt is COVID +, Pt was at [facility] receiving IV transfusion of Bamlanivimab for COVID. Pt began having an allergic reaction to infusion. Pt complained of SHOB, chest pain and hives. [facility] administered  -125mg  IV Solu-Medrol, - 50mg  IV Benadryl,  -0.3 EPI,  -20mg  IV pepcid.  Pt only complaint at this time is chest pain.

## 2019-05-24 NOTE — ED Notes (Addendum)
Pt provided with cranberry Juice, sandwich, chicken noodle soup, cheese, and applesauce.

## 2019-05-24 NOTE — Discharge Instructions (Signed)
As discussed, your evaluation today has been largely reassuring.  But, it is important that you monitor your condition carefully, and do not hesitate to return to the ED if you develop new, or concerning changes in your condition.  Otherwise, please follow-up with your physician for appropriate ongoing care.  For the next day please use Benadryl, 25 mg, 3 times daily and Pepcid 20 mg twice daily for additional control of your allergic reaction.

## 2019-05-30 ENCOUNTER — Other Ambulatory Visit: Payer: Self-pay

## 2019-05-30 ENCOUNTER — Emergency Department: Payer: 59

## 2019-05-30 ENCOUNTER — Encounter: Payer: Self-pay | Admitting: Emergency Medicine

## 2019-05-30 ENCOUNTER — Inpatient Hospital Stay
Admission: EM | Admit: 2019-05-30 | Discharge: 2019-06-02 | DRG: 177 | Disposition: A | Discharge status: Home / Self Care | Payer: 59 | Attending: Hospitalist | Admitting: Hospitalist

## 2019-05-30 DIAGNOSIS — Z801 Family history of malignant neoplasm of trachea, bronchus and lung: Secondary | ICD-10-CM | POA: Diagnosis not present

## 2019-05-30 DIAGNOSIS — R079 Chest pain, unspecified: Secondary | ICD-10-CM | POA: Diagnosis not present

## 2019-05-30 DIAGNOSIS — Z8349 Family history of other endocrine, nutritional and metabolic diseases: Secondary | ICD-10-CM

## 2019-05-30 DIAGNOSIS — G933 Postviral fatigue syndrome: Secondary | ICD-10-CM

## 2019-05-30 DIAGNOSIS — J9601 Acute respiratory failure with hypoxia: Secondary | ICD-10-CM | POA: Diagnosis present

## 2019-05-30 DIAGNOSIS — Z79899 Other long term (current) drug therapy: Secondary | ICD-10-CM

## 2019-05-30 DIAGNOSIS — E876 Hypokalemia: Secondary | ICD-10-CM | POA: Diagnosis present

## 2019-05-30 DIAGNOSIS — I10 Essential (primary) hypertension: Secondary | ICD-10-CM | POA: Diagnosis present

## 2019-05-30 DIAGNOSIS — Z90710 Acquired absence of both cervix and uterus: Secondary | ICD-10-CM | POA: Diagnosis not present

## 2019-05-30 DIAGNOSIS — E119 Type 2 diabetes mellitus without complications: Secondary | ICD-10-CM | POA: Diagnosis present

## 2019-05-30 DIAGNOSIS — Z889 Allergy status to unspecified drugs, medicaments and biological substances status: Secondary | ICD-10-CM

## 2019-05-30 DIAGNOSIS — Z803 Family history of malignant neoplasm of breast: Secondary | ICD-10-CM

## 2019-05-30 DIAGNOSIS — Z90721 Acquired absence of ovaries, unilateral: Secondary | ICD-10-CM

## 2019-05-30 DIAGNOSIS — U071 COVID-19: Principal | ICD-10-CM | POA: Diagnosis present

## 2019-05-30 DIAGNOSIS — R0902 Hypoxemia: Secondary | ICD-10-CM | POA: Diagnosis not present

## 2019-05-30 DIAGNOSIS — Z7982 Long term (current) use of aspirin: Secondary | ICD-10-CM | POA: Diagnosis not present

## 2019-05-30 DIAGNOSIS — Z833 Family history of diabetes mellitus: Secondary | ICD-10-CM

## 2019-05-30 DIAGNOSIS — Z823 Family history of stroke: Secondary | ICD-10-CM | POA: Diagnosis not present

## 2019-05-30 DIAGNOSIS — G9331 Postviral fatigue syndrome: Secondary | ICD-10-CM

## 2019-05-30 DIAGNOSIS — R0602 Shortness of breath: Secondary | ICD-10-CM | POA: Diagnosis not present

## 2019-05-30 DIAGNOSIS — G43909 Migraine, unspecified, not intractable, without status migrainosus: Secondary | ICD-10-CM | POA: Diagnosis present

## 2019-05-30 DIAGNOSIS — J45909 Unspecified asthma, uncomplicated: Secondary | ICD-10-CM | POA: Diagnosis present

## 2019-05-30 DIAGNOSIS — J1289 Other viral pneumonia: Secondary | ICD-10-CM | POA: Diagnosis present

## 2019-05-30 DIAGNOSIS — R002 Palpitations: Secondary | ICD-10-CM | POA: Diagnosis present

## 2019-05-30 DIAGNOSIS — Z79891 Long term (current) use of opiate analgesic: Secondary | ICD-10-CM

## 2019-05-30 DIAGNOSIS — Z8249 Family history of ischemic heart disease and other diseases of the circulatory system: Secondary | ICD-10-CM | POA: Diagnosis not present

## 2019-05-30 DIAGNOSIS — Z818 Family history of other mental and behavioral disorders: Secondary | ICD-10-CM | POA: Diagnosis not present

## 2019-05-30 LAB — GLUCOSE, CAPILLARY
Glucose-Capillary: 218 mg/dL — ABNORMAL HIGH (ref 70–99)
Glucose-Capillary: 250 mg/dL — ABNORMAL HIGH (ref 70–99)

## 2019-05-30 LAB — PROCALCITONIN: Procalcitonin: <0.1 ng/mL

## 2019-05-30 LAB — COMPREHENSIVE METABOLIC PANEL
ALT: 21 U/L (ref 0–44)
AST: 19 U/L (ref 15–41)
Albumin: 3.7 g/dL (ref 3.5–5.0)
Alkaline Phosphatase: 113 U/L (ref 38–126)
Anion gap: 14 (ref 5–15)
BUN: 11 mg/dL (ref 6–20)
CO2: 20 mmol/L — ABNORMAL LOW (ref 22–32)
Calcium: 9 mg/dL (ref 8.9–10.3)
Chloride: 100 mmol/L (ref 98–111)
Creatinine, Ser: 0.72 mg/dL (ref 0.44–1.00)
GFR calc Af Amer: >60 mL/min (ref >60)
GFR calc non Af Amer: >60 mL/min (ref >60)
Glucose, Bld: 183 mg/dL — ABNORMAL HIGH (ref 70–99)
Potassium: 3.1 mmol/L — ABNORMAL LOW (ref 3.5–5.1)
Sodium: 134 mmol/L — ABNORMAL LOW (ref 135–145)
Total Bilirubin: 0.7 mg/dL (ref 0.3–1.2)
Total Protein: 7.1 g/dL (ref 6.5–8.1)

## 2019-05-30 LAB — TROPONIN I (HIGH SENSITIVITY)
Troponin I (High Sensitivity): <2 ng/L (ref <18)
Troponin I (High Sensitivity): <2 ng/L (ref <18)

## 2019-05-30 LAB — CBC WITH DIFFERENTIAL/PLATELET
Abs Immature Granulocytes: 0.16 10*3/uL — ABNORMAL HIGH (ref 0.00–0.07)
Basophils Absolute: 0 10*3/uL (ref 0.0–0.1)
Basophils Relative: 0 %
Eosinophils Absolute: 0 10*3/uL (ref 0.0–0.5)
Eosinophils Relative: 0 %
HCT: 39.9 % (ref 36.0–46.0)
Hemoglobin: 13.4 g/dL (ref 12.0–15.0)
Immature Granulocytes: 2 %
Lymphocytes Relative: 34 %
Lymphs Abs: 3.2 10*3/uL (ref 0.7–4.0)
MCH: 28.6 pg (ref 26.0–34.0)
MCHC: 33.6 g/dL (ref 30.0–36.0)
MCV: 85.1 fL (ref 80.0–100.0)
Monocytes Absolute: 0.7 10*3/uL (ref 0.1–1.0)
Monocytes Relative: 8 %
Neutro Abs: 5.2 10*3/uL (ref 1.7–7.7)
Neutrophils Relative %: 56 %
Platelets: 187 10*3/uL (ref 150–400)
RBC: 4.69 MIL/uL (ref 3.87–5.11)
RDW: 12 % (ref 11.5–15.5)
WBC: 9.3 10*3/uL (ref 4.0–10.5)
nRBC: 0 % (ref 0.0–0.2)

## 2019-05-30 LAB — FIBRIN DERIVATIVES D-DIMER (ARMC ONLY): Fibrin derivatives D-dimer (ARMC): 584.6 ng/mL (FEU) — ABNORMAL HIGH (ref 0.00–499.00)

## 2019-05-30 LAB — ABO/RH: ABO/RH(D): AB POS

## 2019-05-30 LAB — LACTIC ACID, PLASMA: Lactic Acid, Venous: 1.9 mmol/L (ref 0.5–1.9)

## 2019-05-30 LAB — HEMOGLOBIN A1C
Hgb A1c MFr Bld: 8.5 % — ABNORMAL HIGH (ref 4.8–5.6)
Mean Plasma Glucose: 197.25 mg/dL

## 2019-05-30 LAB — C-REACTIVE PROTEIN: CRP: 1.5 mg/dL — ABNORMAL HIGH (ref <1.0)

## 2019-05-30 MED ORDER — OXYCODONE-ACETAMINOPHEN 5-325 MG PO TABS
1.0000 | ORAL_TABLET | ORAL | Status: DC | PRN
  Administered 2019-05-31: 2 via ORAL
  Administered 2019-05-31 – 2019-06-01 (×2): 1 via ORAL
  Filled 2019-05-30: qty 2
  Filled 2019-05-30 (×2): qty 1

## 2019-05-30 MED ORDER — ASCORBIC ACID 500 MG PO TABS
500.0000 mg | ORAL_TABLET | Freq: Every day | ORAL | Status: DC
  Administered 2019-05-30 – 2019-06-02 (×4): 500 mg via ORAL
  Filled 2019-05-30 (×4): qty 1

## 2019-05-30 MED ORDER — ONDANSETRON HCL 4 MG PO TABS: 4.0000 mg | ORAL_TABLET | Freq: Four times a day (QID) | ORAL | Status: DC | PRN

## 2019-05-30 MED ORDER — FAMOTIDINE 20 MG PO TABS
20.0000 mg | ORAL_TABLET | Freq: Every day | ORAL | Status: DC
  Administered 2019-05-30 – 2019-06-01 (×3): 20 mg via ORAL
  Filled 2019-05-30 (×4): qty 1

## 2019-05-30 MED ORDER — SODIUM CHLORIDE 0.9% FLUSH
3.0000 mL | Freq: Two times a day (BID) | INTRAVENOUS | Status: DC
  Administered 2019-05-30 – 2019-06-02 (×7): 3 mL via INTRAVENOUS

## 2019-05-30 MED ORDER — VITAMIN D (ERGOCALCIFEROL) 1.25 MG (50000 UNIT) PO CAPS
50000.0000 [IU] | ORAL_CAPSULE | ORAL | Status: DC
  Administered 2019-05-31: 50000 [IU] via ORAL
  Filled 2019-05-30 (×2): qty 1

## 2019-05-30 MED ORDER — ATORVASTATIN CALCIUM 20 MG PO TABS
20.0000 mg | ORAL_TABLET | Freq: Every day | ORAL | Status: DC
  Administered 2019-05-31 – 2019-06-01 (×2): 20 mg via ORAL
  Filled 2019-05-30 (×3): qty 1

## 2019-05-30 MED ORDER — ONDANSETRON HCL 4 MG/2ML IJ SOLN
4.0000 mg | Freq: Four times a day (QID) | INTRAMUSCULAR | Status: DC | PRN
  Administered 2019-06-01: 4 mg via INTRAVENOUS
  Filled 2019-05-30: qty 2

## 2019-05-30 MED ORDER — DILTIAZEM HCL ER COATED BEADS 240 MG PO TB24
240.0000 mg | ORAL_TABLET | Freq: Every day | ORAL | Status: DC
  Administered 2019-05-31 – 2019-06-02 (×3): 240 mg via ORAL
  Filled 2019-05-30: qty 1
  Filled 2019-05-30: qty 2
  Filled 2019-05-30: qty 1
  Filled 2019-05-30: qty 2
  Filled 2019-05-30 (×2): qty 1

## 2019-05-30 MED ORDER — POTASSIUM CHLORIDE CRYS ER 20 MEQ PO TBCR
40.0000 meq | EXTENDED_RELEASE_TABLET | Freq: Two times a day (BID) | ORAL | Status: AC
  Administered 2019-05-30 (×2): 40 meq via ORAL
  Filled 2019-05-30 (×2): qty 2

## 2019-05-30 MED ORDER — DEXAMETHASONE SODIUM PHOSPHATE 10 MG/ML IJ SOLN
6.0000 mg | INTRAMUSCULAR | Status: DC
  Administered 2019-05-30 – 2019-06-01 (×3): 6 mg via INTRAVENOUS
  Filled 2019-05-30 (×3): qty 0.6

## 2019-05-30 MED ORDER — ONDANSETRON HCL 4 MG/2ML IJ SOLN
4.0000 mg | Freq: Once | INTRAMUSCULAR | Status: AC
  Administered 2019-05-30: 4 mg via INTRAVENOUS

## 2019-05-30 MED ORDER — ZINC SULFATE 220 (50 ZN) MG PO CAPS
220.0000 mg | ORAL_CAPSULE | Freq: Every day | ORAL | Status: DC
  Administered 2019-05-30 – 2019-06-02 (×4): 220 mg via ORAL
  Filled 2019-05-30 (×4): qty 1

## 2019-05-30 MED ORDER — SODIUM CHLORIDE 0.9 % IV SOLN
200.0000 mg | Freq: Once | INTRAVENOUS | Status: AC
  Administered 2019-05-30: 200 mg via INTRAVENOUS
  Filled 2019-05-30: qty 200

## 2019-05-30 MED ORDER — INSULIN GLARGINE 100 UNIT/ML ~~LOC~~ SOLN
45.0000 [IU] | Freq: Every day | SUBCUTANEOUS | Status: DC
  Administered 2019-05-30 – 2019-06-01 (×3): 45 [IU] via SUBCUTANEOUS
  Filled 2019-05-30 (×4): qty 0.45

## 2019-05-30 MED ORDER — ENOXAPARIN SODIUM 40 MG/0.4ML ~~LOC~~ SOLN
40.0000 mg | SUBCUTANEOUS | Status: DC
  Administered 2019-05-30 – 2019-06-01 (×3): 40 mg via SUBCUTANEOUS
  Filled 2019-05-30 (×3): qty 0.4

## 2019-05-30 MED ORDER — ALUM & MAG HYDROXIDE-SIMETH 200-200-20 MG/5ML PO SUSP
30.0000 mL | Freq: Once | ORAL | Status: DC
  Filled 2019-05-30: qty 30

## 2019-05-30 MED ORDER — SODIUM CHLORIDE 0.9 % IV SOLN
2.0000 g | INTRAVENOUS | Status: DC
  Administered 2019-05-30: 2 g via INTRAVENOUS
  Filled 2019-05-30: qty 20
  Filled 2019-05-30: qty 2

## 2019-05-30 MED ORDER — GUAIFENESIN-DM 100-10 MG/5ML PO SYRP
10.0000 mL | ORAL_SOLUTION | ORAL | Status: DC | PRN
  Filled 2019-05-30 (×2): qty 10

## 2019-05-30 MED ORDER — IPRATROPIUM-ALBUTEROL 20-100 MCG/ACT IN AERS
1.0000 | INHALATION_SPRAY | Freq: Four times a day (QID) | RESPIRATORY_TRACT | Status: DC
  Administered 2019-05-31 – 2019-06-02 (×9): 1 via RESPIRATORY_TRACT
  Filled 2019-05-30 (×2): qty 4

## 2019-05-30 MED ORDER — MORPHINE SULFATE (PF) 4 MG/ML IV SOLN
4.0000 mg | Freq: Once | INTRAVENOUS | Status: AC
  Administered 2019-05-30: 4 mg via INTRAVENOUS

## 2019-05-30 MED ORDER — ALPRAZOLAM 0.5 MG PO TABS: 0.5000 mg | ORAL_TABLET | Freq: Three times a day (TID) | ORAL | Status: DC | PRN

## 2019-05-30 MED ORDER — MORPHINE SULFATE (PF) 4 MG/ML IV SOLN
INTRAVENOUS | Status: AC
  Filled 2019-05-30: qty 1

## 2019-05-30 MED ORDER — ASPIRIN EC 81 MG PO TBEC
81.0000 mg | DELAYED_RELEASE_TABLET | Freq: Every day | ORAL | Status: DC
  Administered 2019-05-31 – 2019-06-02 (×3): 81 mg via ORAL
  Filled 2019-05-30 (×3): qty 1

## 2019-05-30 MED ORDER — INSULIN ASPART 100 UNIT/ML ~~LOC~~ SOLN
0.0000 [IU] | Freq: Every day | SUBCUTANEOUS | Status: DC
  Administered 2019-05-30: 2 [IU] via SUBCUTANEOUS
  Administered 2019-05-31 – 2019-06-01 (×2): 3 [IU] via SUBCUTANEOUS
  Filled 2019-05-30 (×3): qty 1

## 2019-05-30 MED ORDER — LOSARTAN POTASSIUM 50 MG PO TABS: 50.0000 mg | ORAL_TABLET | Freq: Every day | ORAL | Status: DC

## 2019-05-30 MED ORDER — POLYETHYLENE GLYCOL 3350 17 G PO PACK: 17.0000 g | PACK | Freq: Every day | ORAL | Status: DC | PRN

## 2019-05-30 MED ORDER — ONDANSETRON HCL 4 MG/2ML IJ SOLN
INTRAMUSCULAR | Status: AC
  Filled 2019-05-30: qty 2

## 2019-05-30 MED ORDER — METOPROLOL SUCCINATE ER 25 MG PO TB24
12.5000 mg | ORAL_TABLET | Freq: Every day | ORAL | Status: DC
  Administered 2019-05-31 – 2019-06-02 (×3): 12.5 mg via ORAL
  Filled 2019-05-30 (×2): qty 1
  Filled 2019-05-30: qty 0.5
  Filled 2019-05-30: qty 1

## 2019-05-30 MED ORDER — AZITHROMYCIN 250 MG PO TABS
500.0000 mg | ORAL_TABLET | Freq: Every day | ORAL | Status: AC
  Administered 2019-05-30: 500 mg via ORAL
  Filled 2019-05-30: qty 2

## 2019-05-30 MED ORDER — INSULIN DEGLUDEC 100 UNIT/ML ~~LOC~~ SOPN: 45.0000 [IU] | PEN_INJECTOR | Freq: Every day | SUBCUTANEOUS | Status: DC

## 2019-05-30 MED ORDER — LOSARTAN POTASSIUM-HCTZ 50-12.5 MG PO TABS: 1.0000 | ORAL_TABLET | Freq: Every day | ORAL | Status: DC

## 2019-05-30 MED ORDER — HYDROCHLOROTHIAZIDE 12.5 MG PO CAPS
12.5000 mg | ORAL_CAPSULE | Freq: Every day | ORAL | Status: DC
  Administered 2019-05-31 – 2019-06-02 (×3): 12.5 mg via ORAL
  Filled 2019-05-30 (×3): qty 1

## 2019-05-30 MED ORDER — SODIUM CHLORIDE 0.9 % IV SOLN
100.0000 mg | Freq: Every day | INTRAVENOUS | Status: DC
  Administered 2019-05-31 – 2019-06-02 (×3): 100 mg via INTRAVENOUS
  Filled 2019-05-30 (×3): qty 100

## 2019-05-30 MED ORDER — INSULIN ASPART 100 UNIT/ML ~~LOC~~ SOLN
0.0000 [IU] | Freq: Three times a day (TID) | SUBCUTANEOUS | Status: DC
  Administered 2019-05-30: 7 [IU] via SUBCUTANEOUS
  Administered 2019-05-31: 11 [IU] via SUBCUTANEOUS
  Administered 2019-05-31: 15 [IU] via SUBCUTANEOUS
  Administered 2019-05-31: 11 [IU] via SUBCUTANEOUS
  Administered 2019-06-01: 20 [IU] via SUBCUTANEOUS
  Administered 2019-06-01 – 2019-06-02 (×4): 15 [IU] via SUBCUTANEOUS
  Filled 2019-05-30 (×9): qty 1

## 2019-05-30 MED ORDER — AZITHROMYCIN 250 MG PO TABS: 250.0000 mg | ORAL_TABLET | Freq: Every day | ORAL | Status: DC

## 2019-05-30 MED ORDER — DEXAMETHASONE SODIUM PHOSPHATE 10 MG/ML IJ SOLN: 6.0000 mg | INTRAMUSCULAR | Status: DC

## 2019-05-30 NOTE — ED Triage Notes (Signed)
COVID + on 05/22/19 , increased fatigue, "increased pain on inspiration, " it feels like broken glass" , sent by PCP

## 2019-05-30 NOTE — ED Notes (Signed)
Ok per Dr. Cinda Quest to d/c 2nd lactic

## 2019-05-30 NOTE — ED Notes (Signed)
Light green tube sent to lab 

## 2019-05-30 NOTE — ED Notes (Signed)
Report given to Simone RN

## 2019-05-30 NOTE — ED Notes (Signed)
Pt given crackers and water. 

## 2019-05-30 NOTE — H&P (Signed)
History and Physical    [name] [id number] DOB: 05-07-1971 DOA: 05/30/2019  PCP: Crecencio Mc, MD   Patient coming from: Home  I have personally briefly reviewed patient's old medical records in [place]  Chief Complaint: Shortness of breath and pleuritic chest pain.  HPI: [name] is a 48 y.o. female with medical history significant of diabetes,HTN, multiple drug allergies, asthma, migraine and history of palpitations came to ED with worsening shortness of breath. She was diagnosed with COVID-19 infection on 05/22/2019.  She had an allergic reaction to Covid antibody infusion on 05/24/2019 at [facility] when she developed chest tightness, dyspnea, lightheadedness and nausea and was given Solu-Medrol, Benadryl, Pepcid and EpiPen.  Her symptoms resolved and she was sent home after that. Patient developed worsening pleuritic chest pain and shortness of breath.  She was desaturating on room air to 88% requiring 2 L.  She did had high-grade fever for initial 2 days which resolved after that.  She started pruritic type chest pain increased with deep breathing since yesterday, chest pain improved with oxygen supplement and morphine.  She was having mild nausea with no vomiting.  Appetite is mildly decreased.  No diarrhea or constipation. No urinary symptoms.  ED Course: On presentation she was afebrile with mild tachycardia and tachypnea and oxygen saturation of 88% requiring 2 L.  Labs positive for potassium of 3.1, D-dimer of 584.  Chest x-ray with a opacity in left lateral base.  Review of Systems: As per HPI otherwise 10 point review of systems negative.   Past Medical History:  Diagnosis Date  . Allergy   . Anemia   . Anxiety    claustrophobic  . Asthma   . Back pain   . Biceps tendonosis of right shoulder   . Diabetes mellitus 2011   did not start metforfin, losing weight  . Dyspnea   . Fatty liver   . Food allergy   . Headache disorder   . History of  concussion   . HLD (hyperlipidemia)   . Hypertension   . IBS (irritable bowel syndrome)   . Infertility, female   . Joint pain   . Lactose intolerance   . Leg edema   . Migraines   . Other specified disorders of thyroid   . Palpitation   . Post-menopausal   . Seborrheic dermatitis   . Shoulder impingement syndrome, right   . Vitamin D deficiency     Past Surgical History:  Procedure Laterality Date  . ABDOMINAL HYSTERECTOMY  2006   heavy menses, endometriosis, l oophrectomy  . BACK SURGERY    . BREAST BIOPSY Right 2018   benign  . BREAST EXCISIONAL BIOPSY Right 2003   benign  . BREAST EXCISIONAL BIOPSY Right 1999   benign  . BREAST SURGERY     right breast x 2 , benign  . HERNIA REPAIR  2003   left inguinal   . LEFT OOPHORECTOMY    . SHOULDER ARTHROSCOPY WITH SUBACROMIAL DECOMPRESSION AND OPEN ROTATOR C Right 07/14/2016   Procedure: RIGHT SHOULDER ARTHROSCOPY WITH SUBACROMIAL DECOMPRESSION, DISTAL CLAVICLE RESECTION AND MINI OPEN ROTATOR CUFF REPAIR, OPEN BICEP TENDODESIS;  Surgeon: Garald Balding, MD;  Location: [place];  Service: Orthopedics;  Laterality: Right;  . SHOULDER CLOSED REDUCTION Right 09/08/2016   Procedure: RIGHT CLOSED MANIPULATION SHOULDER;  Surgeon: Garald Balding, MD;  Location: [place];  Service: Orthopedics;  Laterality: Right;  . TUBAL LIGATION  reports that she has never smoked. She has never used smokeless tobacco. She reports that she does not drink alcohol or use drugs.  Allergies  Allergen Reactions  . Botox [Onabotulinumtoxina] Shortness Of Breath    syncope  . Clindamycin/Lincomycin Other (See Comments)  . Kiwi Extract Shortness Of Breath  . Maxalt [Rizatriptan Benzoate] Anaphylaxis    Chest pain  . Nitrous Oxide Shortness Of Breath    syncope  . Triptans Shortness Of Breath  . Aspirin Nausea And Vomiting    Mouth blisters  . Contrast Media [Iodinated Diagnostic Agents]   . Flagyl  [Metronidazole]   . Hydrocodone-Acetaminophen Other (See Comments)  . Latex     Sometimes causes rash  . Mango Flavor   . Rizatriptan Other (See Comments)  . Vicodin [Hydrocodone-Acetaminophen]     hallucinations  . Flu Virus Vaccine Palpitations  . Penicillins Nausea And Vomiting, Rash and Other (See Comments)    Did it involve swelling of the face/tongue/throat, SOB, or low BP? No Did it involve sudden or severe rash/hives, skin peeling, or any reaction on the inside of your mouth or nose? No Did you need to seek medical attention at a hospital or doctor's office? Yes When did it last happen?patient was 48 years old If all above answers are "NO", may proceed with cephalosporin use.  . Tetracyclines & Related Nausea And Vomiting and Rash    Family History  Problem Relation Age of Onset  . Diabetes Mother   . Heart disease Mother 38  . Hypertension Mother   . Hyperlipidemia Mother   . Heart failure Mother   . Stroke Mother   . Thyroid disease Mother   . Depression Mother   . Sleep apnea Mother   . Obesity Mother   . Cancer Father 49       Lung Cancer  . Heart disease Father   . Mental illness Sister        bipolar, substance abuse,  clean 2 yrs  . Hypertension Sister   . Cancer Paternal Grandmother 38       breast cancer  . Breast cancer Paternal Grandmother   . Diabetes Maternal Grandmother   . Hypertension Maternal Grandmother   . Diabetes Son     Prior to Admission medications   Medication Sig Start Date End Date Taking? Authorizing Provider  albuterol (VENTOLIN HFA) 108 (90 Base) MCG/ACT inhaler Inhale 2 puffs into the lungs every 6 (six) hours as needed for wheezing or shortness of breath. 05/23/19  Yes Wurst, [place], PA-C  ALPRAZolam (XANAX) 0.5 MG tablet Take 1 tablet (0.5 mg total) by mouth 3 (three) times daily as needed for anxiety. Patient taking differently: Take 0.5 mg by mouth 3 (three) times daily as needed (Headache).  09/28/18  Yes Teague Bobbye Morton, PA-C  aspirin EC 81 MG tablet Take 81 mg by mouth daily.   Yes [provider]  atorvastatin (LIPITOR) 20 MG tablet TAKE 1 TABLET BY MOUTH DAILY. Patient taking differently: Take 20 mg by mouth daily at 6 PM.  03/29/19  Yes Crecencio Mc, MD  benzonatate (TESSALON) 100 MG capsule Take 1 capsule (100 mg total) by mouth every 8 (eight) hours. 05/23/19  Yes Wurst, [place], PA-C  butalbital-acetaminophen-caffeine (FIORICET) 50-325-40 MG tablet Take 1 tablet by mouth every 6 (six) hours as needed for headache. Do not refill in less than 30 day 09/28/18  Yes Teague Bobbye Morton, PA-C  diazepam (VALIUM) 2 MG tablet Take 2 mg  by mouth every 6 (six) hours as needed (Headache).    Yes [provider]  diltiazem (CARDIZEM LA) 240 MG 24 hr tablet TAKE 1 TABLET BY MOUTH DAILY Patient taking differently: Take 240 mg by mouth daily.  02/20/19  Yes Crecencio Mc, MD  EPINEPHrine 0.3 mg/0.3 mL IJ SOAJ injection Inject 0.3 mLs (0.3 mg total) into the muscle as needed (for allergic reaction). 05/24/19  Yes Carmin Muskrat, MD  Fremanezumab-vfrm (AJOVY) 225 MG/1.5ML SOAJ Inject 675 mg into the skin every 3 (three) months. 03/22/19  Yes Teague Bobbye Morton, PA-C  hydrochlorothiazide (HYDRODIURIL) 12.5 MG tablet Take 12.5 mg by mouth daily.   Yes [provider]  insulin degludec (TRESIBA FLEXTOUCH) 100 UNIT/ML SOPN FlexTouch Pen Inject 0.45 mLs (45 Units total) into the skin daily. 05/08/19  Yes Renato Shin, MD  ketorolac (TORADOL) 10 MG tablet Take 1 tablet (10 mg total) by mouth every 6 (six) hours as needed. Patient taking differently: Take 10 mg by mouth every 6 (six) hours as needed (Headache).  02/16/16  Yes Anyanwu, Sallyanne Havers, MD  losartan (COZAAR) 50 MG tablet Take 50 mg by mouth daily.   Yes [provider]  metFORMIN (GLUCOPHAGE-XR) 500 MG 24 hr tablet Take 2 tablets (1,000 mg total) by mouth 2 (two) times daily. 05/08/19  Yes Renato Shin, MD  metoprolol  succinate (TOPROL-XL) 25 MG 24 hr tablet Take 0.5 tablets (12.5 mg total) by mouth daily. 09/20/18  Yes End, Harrell Gave, MD  ondansetron (ZOFRAN ODT) 4 MG disintegrating tablet Take 1 tablet (4 mg total) by mouth every 8 (eight) hours as needed for nausea or vomiting. 06/22/17  Yes Carrie Mew, MD  oxyCODONE-acetaminophen (ROXICET) 5-325 MG tablet Take 1-2 tablets by mouth every 4 (four) hours as needed for severe pain. 09/08/16  Yes Petrarca, Mike Craze, PA-C  phentermine 37.5 MG capsule Take 37.5 mg by mouth every morning.   Yes [provider]    Physical Exam: Vitals:   05/30/19 0948 05/30/19 1051 05/30/19 1315 05/30/19 1330  BP:      Pulse:  89 80 88  Resp:  20    Temp:      TempSrc:      SpO2: 97% 95% 92% (!) 88%  Weight:      Height:        Vitals:   05/30/19 0948 05/30/19 1051 05/30/19 1315 05/30/19 1330  BP:      Pulse:  89 80 88  Resp:  20    Temp:      TempSrc:      SpO2: 97% 95% 92% (!) 88%  Weight:      Height:       General: Vital signs reviewed.  Patient is well-developed and well-nourished, in no acute distress and cooperative with exam.  Head: Normocephalic and atraumatic. Eyes: EOMI, conjunctivae normal, no scleral icterus.  ENMT: Mucous membranes are moist. Posterior pharynx clear of any exudate or lesions.Normal dentition.  Neck: Supple, trachea midline, normal ROM, no JVD, masses, thyromegaly, or carotid bruit present.  Cardiovascular: RRR, S1 normal, S2 normal, no murmurs, gallops, or rubs. Pulmonary/Chest: Crackles at left base, rest of lungs clear. Abdominal: Soft, non-tender, non-distended, BS +, no masses, organomegaly, or guarding present.  Extremities: No lower extremity edema bilaterally,  pulses symmetric and intact bilaterally. No cyanosis or clubbing. Neurological: A&O x3, Strength is normal and symmetric bilaterally, cranial nerve II-XII are grossly intact, no focal motor deficit, sensory intact to light touch bilaterally.  Skin:  Warm,  dry and intact. No rashes or erythema. Psychiatric: Normal mood and affect. speech and behavior is normal. Cognition and memory are normal.   Labs on Admission: I have personally reviewed following labs and imaging studies  CBC: Recent Labs  Lab 05/30/19 1004  WBC 9.3  NEUTROABS 5.2  HGB 13.4  HCT 39.9  MCV 85.1  PLT 123XX123   Basic Metabolic Panel: Recent Labs  Lab 05/30/19 1004  NA 134*  K 3.1*  CL 100  CO2 20*  GLUCOSE 183*  BUN 11  CREATININE 0.72  CALCIUM 9.0   GFR: Estimated Creatinine Clearance: 100.4 mL/min (by C-G formula based on SCr of 0.72 mg/dL). Liver Function Tests: Recent Labs  Lab 05/30/19 1004  AST 19  ALT 21  ALKPHOS 113  BILITOT 0.7  PROT 7.1  ALBUMIN 3.7   No results for input(s): LIPASE, AMYLASE in the last 168 hours. No results for input(s): AMMONIA in the last 168 hours. Coagulation Profile: No results for input(s): INR, PROTIME in the last 168 hours. Cardiac Enzymes: No results for input(s): CKTOTAL, CKMB, CKMBINDEX, TROPONINI in the last 168 hours. BNP (last 3 results) No results for input(s): PROBNP in the last 8760 hours. HbA1C: No results for input(s): HGBA1C in the last 72 hours. CBG: No results for input(s): GLUCAP in the last 168 hours. Lipid Profile: No results for input(s): CHOL, HDL, LDLCALC, TRIG, CHOLHDL, LDLDIRECT in the last 72 hours. Thyroid Function Tests: No results for input(s): TSH, T4TOTAL, FREET4, T3FREE, THYROIDAB in the last 72 hours. Anemia Panel: No results for input(s): VITAMINB12, FOLATE, FERRITIN, TIBC, IRON, RETICCTPCT in the last 72 hours. Urine analysis:    Component Value Date/Time   COLORURINE YELLOW (A) 06/22/2017 1325   APPEARANCEUR CLEAR (A) 06/22/2017 1325   APPEARANCEUR Cloudy 06/14/2012 1524   LABSPEC 1.018 06/22/2017 1325   LABSPEC 1.015 06/14/2012 1524   PHURINE 5.0 06/22/2017 1325   GLUCOSEU NEGATIVE 06/22/2017 1325   GLUCOSEU Negative 06/14/2012 1524   HGBUR NEGATIVE 06/22/2017 1325     BILIRUBINUR small (A) 07/01/2018 1614   BILIRUBINUR Negative 06/14/2012 1524   KETONESUR trace (5) (A) 07/01/2018 [place] 06/22/2017 1325   PROTEINUR =30 (A) 07/01/2018 1614   PROTEINUR NEGATIVE 06/22/2017 1325   UROBILINOGEN 1.0 07/01/2018 1614   UROBILINOGEN 0.2 09/14/2013 1938   NITRITE Negative 07/01/2018 1614   NITRITE NEGATIVE 06/22/2017 1325   LEUKOCYTESUR Negative 07/01/2018 1614   LEUKOCYTESUR Negative 06/14/2012 1524    Radiological Exams on Admission: DG Chest Portable 1 View  Result Date: 05/30/2019 CLINICAL DATA:  Chest pain and shortness of breath. COVID-19 positive EXAM: PORTABLE CHEST 1 VIEW COMPARISON:  July 01, 2018 FINDINGS: There is focal airspace opacity in the lateral left base. The lungs elsewhere are clear. Heart size and pulmonary vascular normal. No adenopathy. No bone lesions. IMPRESSION: Airspace opacity consistent with pneumonia left base. Lungs elsewhere clear. Cardiac silhouette normal. No adenopathy. Electronically Signed   By: Lowella Grip III M.D.   On: 05/30/2019 10:15    EKG: Independently reviewed.  Sinus rhythm with S1Q3 and T3 pattern.  This pattern was present on prior EKGs whenever seen after a allergic reaction.  Assessment/Plan Active Problems:   Pneumonia due to COVID-19 virus   COVID-19 pneumonia.  Patient's labs and chest x-ray consistent with COVID-19 pneumonia.  Requiring 2 L.  D-dimer mildly elevated above 500 Rest of the labs pending.  Patient was diagnosed with positive COVID-19 on 05/22/2019.  Patient to have  S1Q3 and T3 pattern.  This pattern was present on prior EKGs whenever seen after a allergic reaction.  Patient has anaphylaxis reaction with dyes therefore CTA was not done. -Start her on remdesivir and Decadron due to current hypoxia and chest x-ray with bilateral infiltrate. -Monitor inflammatory markers. -Vitamin C and zinc. -Can do a VQ scan if needed. -Azithromycin and ceftriaxone for CAP  coverage-as imaging and exam is more concerning for left lower lobe pneumonia, most likely superadded bacterial. -Checking PCT-if normal can discontinue antibiotics.  Hypokalemia.  Patient with potassium of 3.1. -Check magnesium. -Repleat electrolytes and monitor.  Diabetes.  A1c of 7.5 in October 2020. Continue home dose of Tresiba. -Add resistant scale SSI as patient is on steroids now.  History of palpitations.  Being worked up by cardiologist and using Cardizem and metoprolol. -Continue home meds of Cardizem and metoprolol. -Continue aspirin and Lipitor.  Hypertension.  Blood pressure on softer side. -Holding home losartan-can be restarted once blood pressure improves.  DVT prophylaxis: Lovenox Code Status: Full code Family Communication: No family at bedside Disposition Plan: Pending improvement and completion of remdesivir Consults called: None Admission status: Inpatient   Lorella Nimrod MD [facility] Pager [phone]  If 7PM-7AM, please contact night-coverage [email or web address] Password [facility]  05/30/2019, 2:54 PM   This record has been created using Dragon voice recognition software. Errors have been sought and corrected,but may not always be located. Such creation errors do not reflect on the standard of care.

## 2019-05-30 NOTE — Consult Note (Signed)
Remdesivir - Pharmacy Brief Note   O:  ALT: 21 U/L at baseline D-Dime: 584.6 ng/mL CXR: Airspace opacity consistent with pneumonia left base SpO2: 88% on room air   A/P:  Remdesivir 200 mg IVPB once followed by 100 mg IVPB daily x 4 days  Stop date 06/04/19 in place  Vallery Sa, PharmD 05/30/2019 2:27 PM

## 2019-05-30 NOTE — ED Notes (Signed)
Pt placed on 2L [place] per Dr. Cinda Quest

## 2019-05-30 NOTE — ED Provider Notes (Signed)
[facility] Emergency Department Provider Note   ____________________________________________   First MD Initiated Contact with Patient 05/30/19 [phone]     (approximate)  I have reviewed the triage vital signs and the nursing notes.   HISTORY  Chief Complaint Respiratory Distress    HPI [name] is a 48 y.o. female patient tested Covid positive.  She then had an antibody infusion but had what appeared to be an anaphylactic reaction to it halfway through.   Patient complains of pleuritic chest pain feeling like broken glasses in her chest when she breathes.  This is mostly in the middle of her chest although it is not as bad on both sides.  Patient has an S1Q3T3 on her EKG but it has been present in various degrees multiple times in the past most significantly on 02 Nov 2016.      Past Medical History:  Diagnosis Date  . Allergy   . Anemia   . Anxiety    claustrophobic  . Asthma   . Back pain   . Biceps tendonosis of right shoulder   . Diabetes mellitus 2011   did not start metforfin, losing weight  . Dyspnea   . Fatty liver   . Food allergy   . Headache disorder   . History of concussion   . HLD (hyperlipidemia)   . Hypertension   . IBS (irritable bowel syndrome)   . Infertility, female   . Joint pain   . Lactose intolerance   . Leg edema   . Migraines   . Other specified disorders of thyroid   . Palpitation   . Post-menopausal   . Seborrheic dermatitis   . Shoulder impingement syndrome, right   . Vitamin D deficiency     Patient Active Problem List   Diagnosis Date Noted  . Pneumonia due to COVID-19 virus 05/30/2019  . Acute lower respiratory infection 06/04/2018  . Other fatigue 01/23/2018  . Shortness of breath on exertion 01/23/2018  . Multinodular goiter 12/05/2017  . Multiple thyroid nodules 12/05/2017  . Microalbuminuria due to type 2 diabetes mellitus ([place]) 11/26/2017  . Pharyngitis 11/26/2017  . Encounter for  preventive health examination 11/26/2017  . Abnormal TSH 11/26/2017  . Somnolence, daytime 11/14/2017  . Menopause syndrome 06/10/2017  . Adverse reaction to vaccine, sequela 03/25/2017  . Atypical chest pain 02/27/2017  . Palpitations 02/27/2017  . PAC (premature atrial contraction) 02/27/2017  . PVC's (premature ventricular contractions) 02/27/2017  . PSVT (paroxysmal supraventricular tachycardia) ([place]) 11/26/2016  . History of palpitations 11/08/2016  . Nontraumatic incomplete tear of right rotator cuff 07/14/2016  . AC (acromioclavicular) arthritis 07/14/2016  . Impingement syndrome of right shoulder 07/14/2016  . Fibrocystic breast changes, right 05/21/2016  . Menopausal symptoms 10/30/2015  . Insomnia 10/16/2015  . Snoring 07/26/2015  . Migraine without aura and without status migrainosus, not intractable 02/24/2015  . Muscle spasm 02/24/2015  . Facial paresthesia 02/24/2015  . History of concussion 07/21/2014  . Hyperlipidemia associated with type 2 diabetes mellitus ([place]) 06/13/2014  . Pain in joint, shoulder region 08/11/2013  . Vitamin D deficiency 05/07/2013  . Chronic migraine 11/20/2012  . S/P Total Abdominal Hysterectomy and Left Salpingo-oophorectomy 10/20/2011  . Headache disorder   . Uncontrolled type 2 diabetes mellitus ([place]) 07/20/2011  . Obesity (BMI 30-39.9) 07/20/2011  . Essential hypertension 07/20/2011    Past Surgical History:  Procedure Laterality Date  . ABDOMINAL HYSTERECTOMY  2006   heavy menses, endometriosis, l oophrectomy  .  BACK SURGERY    . BREAST BIOPSY Right 2018   benign  . BREAST EXCISIONAL BIOPSY Right 2003   benign  . BREAST EXCISIONAL BIOPSY Right 1999   benign  . BREAST SURGERY     right breast x 2 , benign  . HERNIA REPAIR  2003   left inguinal   . LEFT OOPHORECTOMY    . SHOULDER ARTHROSCOPY WITH SUBACROMIAL DECOMPRESSION AND OPEN ROTATOR C Right 07/14/2016   Procedure: RIGHT SHOULDER ARTHROSCOPY WITH SUBACROMIAL  DECOMPRESSION, DISTAL CLAVICLE RESECTION AND MINI OPEN ROTATOR CUFF REPAIR, OPEN BICEP TENDODESIS;  Surgeon: Garald Balding, MD;  Location: [place];  Service: Orthopedics;  Laterality: Right;  . SHOULDER CLOSED REDUCTION Right 09/08/2016   Procedure: RIGHT CLOSED MANIPULATION SHOULDER;  Surgeon: Garald Balding, MD;  Location: [place];  Service: Orthopedics;  Laterality: Right;  . TUBAL LIGATION      Prior to Admission medications   Medication Sig Start Date End Date Taking? Authorizing Provider  albuterol (VENTOLIN HFA) 108 (90 Base) MCG/ACT inhaler Inhale 2 puffs into the lungs every 6 (six) hours as needed for wheezing or shortness of breath. 05/23/19  Yes Wurst, [place], PA-C  ALPRAZolam (XANAX) 0.5 MG tablet Take 1 tablet (0.5 mg total) by mouth 3 (three) times daily as needed for anxiety. Patient taking differently: Take 0.5 mg by mouth 3 (three) times daily as needed (Headache).  09/28/18  Yes Teague Bobbye Morton, PA-C  aspirin EC 81 MG tablet Take 81 mg by mouth daily.   Yes [provider]  atorvastatin (LIPITOR) 20 MG tablet TAKE 1 TABLET BY MOUTH DAILY. Patient taking differently: Take 20 mg by mouth daily at 6 PM.  03/29/19  Yes Crecencio Mc, MD  benzonatate (TESSALON) 100 MG capsule Take 1 capsule (100 mg total) by mouth every 8 (eight) hours. 05/23/19  Yes Wurst, [place], PA-C  butalbital-acetaminophen-caffeine (FIORICET) 50-325-40 MG tablet Take 1 tablet by mouth every 6 (six) hours as needed for headache. Do not refill in less than 30 day 09/28/18  Yes Teague Bobbye Morton, PA-C  diazepam (VALIUM) 2 MG tablet Take 2 mg by mouth every 6 (six) hours as needed (Headache).    Yes [provider]  diltiazem (CARDIZEM LA) 240 MG 24 hr tablet TAKE 1 TABLET BY MOUTH DAILY Patient taking differently: Take 240 mg by mouth daily.  02/20/19  Yes Crecencio Mc, MD  EPINEPHrine 0.3 mg/0.3 mL IJ SOAJ injection Inject 0.3 mLs (0.3 mg  total) into the muscle as needed (for allergic reaction). 05/24/19  Yes Carmin Muskrat, MD  Fremanezumab-vfrm (AJOVY) 225 MG/1.5ML SOAJ Inject 675 mg into the skin every 3 (three) months. 03/22/19  Yes Teague Bobbye Morton, PA-C  hydrochlorothiazide (HYDRODIURIL) 12.5 MG tablet Take 12.5 mg by mouth daily.   Yes [provider]  insulin degludec (TRESIBA FLEXTOUCH) 100 UNIT/ML SOPN FlexTouch Pen Inject 0.45 mLs (45 Units total) into the skin daily. 05/08/19  Yes Renato Shin, MD  ketorolac (TORADOL) 10 MG tablet Take 1 tablet (10 mg total) by mouth every 6 (six) hours as needed. Patient taking differently: Take 10 mg by mouth every 6 (six) hours as needed (Headache).  02/16/16  Yes Anyanwu, Sallyanne Havers, MD  losartan (COZAAR) 50 MG tablet Take 50 mg by mouth daily.   Yes [provider]  metFORMIN (GLUCOPHAGE-XR) 500 MG 24 hr tablet Take 2 tablets (1,000 mg total) by mouth 2 (two) times daily. 05/08/19  Yes Loanne Drilling,  Hilliard Clark, MD  metoprolol succinate (TOPROL-XL) 25 MG 24 hr tablet Take 0.5 tablets (12.5 mg total) by mouth daily. 09/20/18  Yes End, Harrell Gave, MD  ondansetron (ZOFRAN ODT) 4 MG disintegrating tablet Take 1 tablet (4 mg total) by mouth every 8 (eight) hours as needed for nausea or vomiting. 06/22/17  Yes Carrie Mew, MD  oxyCODONE-acetaminophen (ROXICET) 5-325 MG tablet Take 1-2 tablets by mouth every 4 (four) hours as needed for severe pain. 09/08/16  Yes Petrarca, Mike Craze, PA-C  phentermine 37.5 MG capsule Take 37.5 mg by mouth every morning.   Yes [provider]    Allergies Botox [onabotulinumtoxina], Clindamycin/lincomycin, Kiwi extract, Maxalt [rizatriptan benzoate], Nitrous oxide, Triptans, Aspirin, Contrast media [iodinated diagnostic agents], Flagyl [metronidazole], Hydrocodone-acetaminophen, Latex, Mango flavor, Rizatriptan, Vicodin [hydrocodone-acetaminophen], Flu virus vaccine, Penicillins, and Tetracyclines & related  Family History  Problem  Relation Age of Onset  . Diabetes Mother   . Heart disease Mother 52  . Hypertension Mother   . Hyperlipidemia Mother   . Heart failure Mother   . Stroke Mother   . Thyroid disease Mother   . Depression Mother   . Sleep apnea Mother   . Obesity Mother   . Cancer Father 36       Lung Cancer  . Heart disease Father   . Mental illness Sister        bipolar, substance abuse,  clean 2 yrs  . Hypertension Sister   . Cancer Paternal Grandmother 70       breast cancer  . Breast cancer Paternal Grandmother   . Diabetes Maternal Grandmother   . Hypertension Maternal Grandmother   . Diabetes Son     Social History Social History   Tobacco Use  . Smoking status: Never Smoker  . Smokeless tobacco: Never Used  Substance Use Topics  . Alcohol use: No  . Drug use: No    Review of Systems  Constitutional: No fever/chills Eyes: No visual changes. ENT: No sore throat. Cardiovascular:  chest pain. Respiratory:  shortness of breath. Gastrointestinal: No abdominal pain.  No nausea, no vomiting.  No diarrhea.  No constipation. Genitourinary: Negative for dysuria. Musculoskeletal: Negative for back pain. Skin: Negative for rash. Neurological: Negative for headaches, focal weakness  ____________________________________________   PHYSICAL EXAM:  VITAL SIGNS: ED Triage Vitals  Enc Vitals Group     BP 05/30/19 0943 102/80     Pulse Rate 05/30/19 0943 (!) 103     Resp 05/30/19 0943 (!) 22     Temp 05/30/19 0943 99 F (37.2 C)     Temp Source 05/30/19 0943 Oral     SpO2 05/30/19 0943 97 %     Weight 05/30/19 0943 216 lb (98 kg)     Height 05/30/19 0943 5\' 6"  (1.676 m)     Head Circumference --      Peak Flow --      Pain Score 05/30/19 0948 4     Pain Loc --      Pain Edu? --      Excl. in [place]? --     Constitutional: Alert and oriented.  Looks uncomfortable Eyes: Conjunctivae are normal.  Head: Atraumatic. Nose: No congestion/rhinnorhea. Mouth/Throat: Mucous membranes  are moist.  Oropharynx non-erythematous. Neck: No stridor.   Cardiovascular: Normal rate, regular rhythm. Grossly normal heart sounds.  Good peripheral circulation. Respiratory: Normal respiratory effort.  No retractions. Lungs CTAB. Gastrointestinal: Soft and nontender. No distention. No abdominal bruits. No CVA tenderness. Musculoskeletal: No lower extremity  tenderness nor edema.  Neurologic:  Normal speech and language. No gross focal neurologic deficits are appreciated.  Skin:  Skin is warm, dry and intact. No rash noted.   ____________________________________________   LABS (all labs ordered are listed, but only abnormal results are displayed)  Labs Reviewed  COMPREHENSIVE METABOLIC PANEL - Abnormal; Notable for the following components:      Result Value   Sodium 134 (*)    Potassium 3.1 (*)    CO2 20 (*)    Glucose, Bld 183 (*)    All other components within normal limits  CBC WITH DIFFERENTIAL/PLATELET - Abnormal; Notable for the following components:   Abs Immature Granulocytes 0.16 (*)    All other components within normal limits  FIBRIN DERIVATIVES D-DIMER ([facility] ONLY) - Abnormal; Notable for the following components:   Fibrin derivatives D-dimer ([facility]) 584.60 (*)    All other components within normal limits  LACTIC ACID, PLASMA  C-REACTIVE PROTEIN  CBC  CREATININE, SERUM  PROCALCITONIN  HEMOGLOBIN A1C  ABO/RH  TROPONIN I (HIGH SENSITIVITY)  TROPONIN I (HIGH SENSITIVITY)   ____________________________________________  EKG  EKG read interpreted by me shows normal sinus rhythm rate of 93 normal axis no acute ST-T wave changes there is no S1Q3T3 present but as I mentioned this has been present multiple times before.  The best example of this is in 02 Nov 2016. ____________________________________________  RADIOLOGY  ED MD interpretation: Chest x-ray read by radiology reviewed by me shows airspace opacity consistent with pneumonia in the left base.  Official  radiology report(s): DG Chest Portable 1 View  Result Date: 05/30/2019 CLINICAL DATA:  Chest pain and shortness of breath. COVID-19 positive EXAM: PORTABLE CHEST 1 VIEW COMPARISON:  July 01, 2018 FINDINGS: There is focal airspace opacity in the lateral left base. The lungs elsewhere are clear. Heart size and pulmonary vascular normal. No adenopathy. No bone lesions. IMPRESSION: Airspace opacity consistent with pneumonia left base. Lungs elsewhere clear. Cardiac silhouette normal. No adenopathy. Electronically Signed   By: Lowella Grip III M.D.   On: 05/30/2019 10:15    ____________________________________________   PROCEDURES  Procedure(s) performed (including Critical Care):  Procedures   ____________________________________________   INITIAL IMPRESSION / ASSESSMENT AND PLAN / ED COURSE  Patient with known diagnosis of Covid positivity.  This will put her D-dimer up.  Additionally her D-dimer is much lower than what we normally would see here if she had a blood clot.  Almost always blood clots will result in a D-dimer at least twice normal here.  Patient does have swelling and shortness of breath with IV contrast exposure.  I think putting the whole picture together that it is unlikely that she has a pulmonary embolus at this point.   I discussed this with the hospitalist.  We will get her in the hospital we could do a VQ scan if needed.          ____________________________________________   FINAL CLINICAL IMPRESSION(S) / ED DIAGNOSES  Final diagnoses:  [id number]  Hypoxia     ED Discharge Orders    None       Note:  This document was prepared using Dragon voice recognition software and may include unintentional dictation errors.    Nena Polio, MD 05/30/19 [phone]

## 2019-05-30 NOTE — ED Notes (Signed)
Dr. Cinda Quest informed of pt's O2 saturation decreasing to 92% on RA, advised to monitor and informed him if this worsens.

## 2019-05-30 NOTE — ED Notes (Signed)
Pt ambulated to toilet with steady gait, SHOB observed on ambulation. Pt O2 saturation does not drop below 98% during ambulation on room air

## 2019-05-31 DIAGNOSIS — U071 COVID-19: Principal | ICD-10-CM

## 2019-05-31 DIAGNOSIS — R0902 Hypoxemia: Secondary | ICD-10-CM

## 2019-05-31 LAB — GLUCOSE, CAPILLARY
Glucose-Capillary: 264 mg/dL — ABNORMAL HIGH (ref 70–99)
Glucose-Capillary: 291 mg/dL — ABNORMAL HIGH (ref 70–99)
Glucose-Capillary: 329 mg/dL — ABNORMAL HIGH (ref 70–99)
Glucose-Capillary: 254 mg/dL — ABNORMAL HIGH (ref 70–99)

## 2019-05-31 LAB — COMPREHENSIVE METABOLIC PANEL
ALT: 21 U/L (ref 0–44)
AST: 17 U/L (ref 15–41)
Albumin: 3.8 g/dL (ref 3.5–5.0)
Alkaline Phosphatase: 112 U/L (ref 38–126)
Anion gap: 10 (ref 5–15)
BUN: 13 mg/dL (ref 6–20)
CO2: 25 mmol/L (ref 22–32)
Calcium: 8.9 mg/dL (ref 8.9–10.3)
Chloride: 101 mmol/L (ref 98–111)
Creatinine, Ser: 0.69 mg/dL (ref 0.44–1.00)
GFR calc Af Amer: >60 mL/min (ref >60)
GFR calc non Af Amer: >60 mL/min (ref >60)
Glucose, Bld: 270 mg/dL — ABNORMAL HIGH (ref 70–99)
Potassium: 4.7 mmol/L (ref 3.5–5.1)
Sodium: 136 mmol/L (ref 135–145)
Total Bilirubin: 0.5 mg/dL (ref 0.3–1.2)
Total Protein: 7 g/dL (ref 6.5–8.1)

## 2019-05-31 LAB — FERRITIN: Ferritin: 294 ng/mL (ref 11–307)

## 2019-05-31 LAB — CBC WITH DIFFERENTIAL/PLATELET
Abs Immature Granulocytes: 0.09 10*3/uL — ABNORMAL HIGH (ref 0.00–0.07)
Basophils Absolute: 0 10*3/uL (ref 0.0–0.1)
Basophils Relative: 0 %
Eosinophils Absolute: 0 10*3/uL (ref 0.0–0.5)
Eosinophils Relative: 0 %
HCT: 40.2 % (ref 36.0–46.0)
Hemoglobin: 13.2 g/dL (ref 12.0–15.0)
Immature Granulocytes: 1 %
Lymphocytes Relative: 18 %
Lymphs Abs: 1.4 10*3/uL (ref 0.7–4.0)
MCH: 28.8 pg (ref 26.0–34.0)
MCHC: 32.8 g/dL (ref 30.0–36.0)
MCV: 87.8 fL (ref 80.0–100.0)
Monocytes Absolute: 0.3 10*3/uL (ref 0.1–1.0)
Monocytes Relative: 4 %
Neutro Abs: 5.9 10*3/uL (ref 1.7–7.7)
Neutrophils Relative %: 77 %
Platelets: 197 10*3/uL (ref 150–400)
RBC: 4.58 MIL/uL (ref 3.87–5.11)
RDW: 11.9 % (ref 11.5–15.5)
WBC: 7.7 10*3/uL (ref 4.0–10.5)
nRBC: 0 % (ref 0.0–0.2)

## 2019-05-31 LAB — C-REACTIVE PROTEIN: CRP: 1.6 mg/dL — ABNORMAL HIGH (ref <1.0)

## 2019-05-31 LAB — PHOSPHORUS: Phosphorus: 2.8 mg/dL (ref 2.5–4.6)

## 2019-05-31 LAB — MAGNESIUM: Magnesium: 2 mg/dL (ref 1.7–2.4)

## 2019-05-31 LAB — FIBRIN DERIVATIVES D-DIMER (ARMC ONLY): Fibrin derivatives D-dimer (ARMC): 561.12 ng/mL (FEU) — ABNORMAL HIGH (ref 0.00–499.00)

## 2019-05-31 NOTE — Progress Notes (Addendum)
Late Entry: notified Dr. Billie Ruddy about patient's complaints of palpitations, patient also complaints of pleuritic chest pain and was given oxycodone. Pain and palpitations are exacerbated with activity. VSS. Patient has PVC's in the monitor, electrolytes are ok this morning. Asked MD if we need to do12 Lead EKG, per MD to just monitor symptoms.   When reassess patient's palpitations, she said its decreasing and resolving. RN will continue to monitor.

## 2019-05-31 NOTE — Progress Notes (Signed)
PROGRESS NOTE    [name]  [id number] DOB: [birth year] DOA: 05/30/2019 PCP: Crecencio Mc, MD    Assessment & Plan:   Active Problems:   COVID-19   Hypoxia    [name] is a 48 y.o. AA female with medical history significant of diabetes,HTN, multiple drug allergies, asthma, migraine and history of palpitations who came to ED with worsening shortness of breath. She was diagnosed with COVID-19 infection on 05/22/2019.  She had an allergic reaction to Covid antibody infusion on 05/24/2019 at [facility] when she developed chest tightness, dyspnea, lightheadedness and nausea and was given Solu-Medrol, Benadryl, Pepcid and EpiPen.  Her symptoms resolved and she was sent home after that.  Patient developed worsening pleuritic chest pain and shortness of breath.  In the ED, pt was desaturating on room air to 88% requiring 2 L.   # Acute hypoxic respiratory failure 2/2 COVID-19 pneumonia.   Patient's labs and chest x-ray consistent with COVID-19 pneumonia.  Requiring 2 L.  D-dimer mildly elevated above 500.  Patient was diagnosed with positive COVID-19 on 05/22/2019.  Patient had S1Q3 and T3 pattern.  This pattern was present on prior EKGs whenever seen after a allergic reaction.  Patient has anaphylaxis reaction with dyes therefore CTA was not done. -Started her on remdesivir and Decadron due to current hypoxia and chest x-ray with bilateral infiltrate.  Procal neg. PLAN: --continue Remdesivir and Decadron, Day 2 -Monitor inflammatory markers. -Vitamin C and zinc. --d/c abx  Hypokalemia, resolved Patient with potassium of 3.1. -replete electrolytes and monitor.  Diabetes.   A1c of 7.5 in October 2020. Continue home dose of Tresiba. -Add resistant scale SSI as patient is on steroids now.  History of palpitations.   Being worked up by cardiologist and using Cardizem and metoprolol. -Continue home meds of Cardizem and metoprolol. -Continue aspirin and  Lipitor.  Hypertension.   Blood pressure on softer side. -Holding home losartan, can be restarted once blood pressure improves.   DVT prophylaxis: Lovenox SQ Code Status: Full code  Family Communication: not today Disposition Plan: Home   Subjective and Interval History:  Pt reported right-sided chest pain with exertion and when taking a deep breath.  Also nausea with steroids.  No vomiting or diarrhea.  No fever, abdominal pain.   Objective: Vitals:   05/31/19 0228 05/31/19 0314 05/31/19 0806 05/31/19 1709  BP:  117/88 119/77 116/80  Pulse:  77 84 76  Resp:  18 20 19   Temp:  98.1 F (36.7 C) 98.5 F (36.9 C) 98.8 F (37.1 C)  TempSrc:  Oral Oral Oral  SpO2:  98% 100% 100%  Weight: 97.5 kg     Height:        Intake/Output Summary (Last 24 hours) at 05/31/2019 1928 Last data filed at 05/31/2019 1708 Gross per 24 hour  Intake 200 ml  Output 2000 ml  Net -1800 ml   Filed Weights   05/30/19 0943 05/31/19 0228  Weight: 98 kg 97.5 kg    Examination:   Constitutional: NAD, AAOx3 HEENT: conjunctivae and lids normal, EOMI CV: RRR no M,R,G. Distal pulses +2.  No cyanosis.  RESP: very reduced lung sounds, normal respiratory effort, on 2L GI: +BS, NTND Extremities: No effusions, edema, or tenderness in BLE SKIN: warm, dry and intact Neuro: II - XII grossly intact.  Sensation intact Psych: Normal mood and affect.  Appropriate judgement and reason   Data Reviewed: I have personally reviewed following labs and imaging studies  CBC: Recent  Labs  Lab 05/30/19 1004 05/31/19 0656  WBC 9.3 7.7  NEUTROABS 5.2 5.9  HGB 13.4 13.2  HCT 39.9 40.2  MCV 85.1 87.8  PLT 187 [id number]   Basic Metabolic Panel: Recent Labs  Lab 05/30/19 1004 05/31/19 0656  NA 134* 136  K 3.1* 4.7  CL 100 101  CO2 20* 25  GLUCOSE 183* 270*  BUN 11 13  CREATININE 0.72 0.69  CALCIUM 9.0 8.9  MG  --  2.0  PHOS  --  2.8   GFR: Estimated Creatinine Clearance: 100.2 mL/min (by C-G formula  based on SCr of 0.69 mg/dL). Liver Function Tests: Recent Labs  Lab 05/30/19 1004 05/31/19 0656  AST 19 17  ALT 21 21  ALKPHOS 113 112  BILITOT 0.7 0.5  PROT 7.1 7.0  ALBUMIN 3.7 3.8   No results for input(s): LIPASE, AMYLASE in the last 168 hours. No results for input(s): AMMONIA in the last 168 hours. Coagulation Profile: No results for input(s): INR, PROTIME in the last 168 hours. Cardiac Enzymes: No results for input(s): CKTOTAL, CKMB, CKMBINDEX, TROPONINI in the last 168 hours. BNP (last 3 results) No results for input(s): PROBNP in the last 8760 hours. HbA1C: Recent Labs    05/30/19 1655  HGBA1C 8.5*   CBG: Recent Labs  Lab 05/30/19 1618 05/30/19 2016 05/31/19 0809 05/31/19 1135 05/31/19 1708  GLUCAP 218* 250* 264* 291* 329*   Lipid Profile: No results for input(s): CHOL, HDL, LDLCALC, TRIG, CHOLHDL, LDLDIRECT in the last 72 hours. Thyroid Function Tests: No results for input(s): TSH, T4TOTAL, FREET4, T3FREE, THYROIDAB in the last 72 hours. Anemia Panel: Recent Labs    05/31/19 0656  FERRITIN 294   Sepsis Labs: Recent Labs  Lab 05/30/19 1004 05/30/19 1215  PROCALCITON  --  <0.10  LATICACIDVEN 1.9  --     No results found for this or any previous visit (from the past 240 hour(s)).    Radiology Studies: DG Chest Portable 1 View  Result Date: 05/30/2019 CLINICAL DATA:  Chest pain and shortness of breath. COVID-19 positive EXAM: PORTABLE CHEST 1 VIEW COMPARISON:  July 01, 2018 FINDINGS: There is focal airspace opacity in the lateral left base. The lungs elsewhere are clear. Heart size and pulmonary vascular normal. No adenopathy. No bone lesions. IMPRESSION: Airspace opacity consistent with pneumonia left base. Lungs elsewhere clear. Cardiac silhouette normal. No adenopathy. Electronically Signed   By: Lowella Grip III M.D.   On: 05/30/2019 10:15     Scheduled Meds: . alum & mag hydroxide-simeth  30 mL Oral Once  . vitamin C  500 mg Oral  Daily  . aspirin EC  81 mg Oral Daily  . atorvastatin  20 mg Oral QHS  . dexamethasone (DECADRON) injection  6 mg Intravenous Q24H  . diltiazem  240 mg Oral Daily  . enoxaparin (LOVENOX) injection  40 mg Subcutaneous Q24H  . famotidine  20 mg Oral Daily  . hydrochlorothiazide  12.5 mg Oral Daily  . insulin aspart  0-20 Units Subcutaneous TID WC  . insulin aspart  0-5 Units Subcutaneous QHS  . insulin glargine  45 Units Subcutaneous Daily  . Ipratropium-Albuterol  1 puff Inhalation Q6H  . metoprolol succinate  12.5 mg Oral Daily  . sodium chloride flush  3 mL Intravenous Q12H  . Vitamin D (Ergocalciferol)  50,000 Units Oral Weekly  . zinc sulfate  220 mg Oral Daily   Continuous Infusions: . remdesivir 100 mg in NS 100 mL Stopped (05/31/19 1507)  LOS: 1 day     Enzo Bi, MD [facility] If 7PM-7AM, please contact night-coverage 05/31/2019, 7:28 PM

## 2019-05-31 NOTE — Plan of Care (Signed)
  Problem: Education: Goal: Knowledge of General Education information will improve Description: Including pain rating scale, medication(s)/side effects and non-pharmacologic comfort measures Outcome: Progressing   Problem: Clinical Measurements: Goal: Respiratory complications will improve Outcome: Progressing   

## 2019-06-01 LAB — CBC WITH DIFFERENTIAL/PLATELET
Abs Immature Granulocytes: 0.11 10*3/uL — ABNORMAL HIGH (ref 0.00–0.07)
Basophils Absolute: 0 10*3/uL (ref 0.0–0.1)
Basophils Relative: 0 %
Eosinophils Absolute: 0 10*3/uL (ref 0.0–0.5)
Eosinophils Relative: 0 %
HCT: 41.9 % (ref 36.0–46.0)
Hemoglobin: 14.2 g/dL (ref 12.0–15.0)
Immature Granulocytes: 1 %
Lymphocytes Relative: 17 %
Lymphs Abs: 1.4 10*3/uL (ref 0.7–4.0)
MCH: 28.6 pg (ref 26.0–34.0)
MCHC: 33.9 g/dL (ref 30.0–36.0)
MCV: 84.5 fL (ref 80.0–100.0)
Monocytes Absolute: 0.3 10*3/uL (ref 0.1–1.0)
Monocytes Relative: 4 %
Neutro Abs: 6.4 10*3/uL (ref 1.7–7.7)
Neutrophils Relative %: 78 %
Platelets: 248 10*3/uL (ref 150–400)
RBC: 4.96 MIL/uL (ref 3.87–5.11)
RDW: 11.9 % (ref 11.5–15.5)
WBC: 8.2 10*3/uL (ref 4.0–10.5)
nRBC: 0 % (ref 0.0–0.2)

## 2019-06-01 LAB — FERRITIN: Ferritin: 420 ng/mL — ABNORMAL HIGH (ref 11–307)

## 2019-06-01 LAB — COMPREHENSIVE METABOLIC PANEL
ALT: 25 U/L (ref 0–44)
AST: 21 U/L (ref 15–41)
Albumin: 4.1 g/dL (ref 3.5–5.0)
Alkaline Phosphatase: 120 U/L (ref 38–126)
Anion gap: 12 (ref 5–15)
BUN: 17 mg/dL (ref 6–20)
CO2: 24 mmol/L (ref 22–32)
Calcium: 9.1 mg/dL (ref 8.9–10.3)
Chloride: 99 mmol/L (ref 98–111)
Creatinine, Ser: 0.82 mg/dL (ref 0.44–1.00)
GFR calc Af Amer: >60 mL/min (ref >60)
GFR calc non Af Amer: >60 mL/min (ref >60)
Glucose, Bld: 337 mg/dL — ABNORMAL HIGH (ref 70–99)
Potassium: 3.8 mmol/L (ref 3.5–5.1)
Sodium: 135 mmol/L (ref 135–145)
Total Bilirubin: 0.6 mg/dL (ref 0.3–1.2)
Total Protein: 7.9 g/dL (ref 6.5–8.1)

## 2019-06-01 LAB — GLUCOSE, CAPILLARY
Glucose-Capillary: 383 mg/dL — ABNORMAL HIGH (ref 70–99)
Glucose-Capillary: 320 mg/dL — ABNORMAL HIGH (ref 70–99)
Glucose-Capillary: 336 mg/dL — ABNORMAL HIGH (ref 70–99)
Glucose-Capillary: 292 mg/dL — ABNORMAL HIGH (ref 70–99)

## 2019-06-01 LAB — PHOSPHORUS: Phosphorus: 3.7 mg/dL (ref 2.5–4.6)

## 2019-06-01 LAB — MAGNESIUM: Magnesium: 2 mg/dL (ref 1.7–2.4)

## 2019-06-01 LAB — C-REACTIVE PROTEIN: CRP: 1.1 mg/dL — ABNORMAL HIGH (ref <1.0)

## 2019-06-01 LAB — FIBRIN DERIVATIVES D-DIMER (ARMC ONLY): Fibrin derivatives D-dimer (ARMC): 464.03 ng/mL (FEU) (ref 0.00–499.00)

## 2019-06-01 MED ORDER — ACETAMINOPHEN 500 MG PO TABS
1000.0000 mg | ORAL_TABLET | Freq: Three times a day (TID) | ORAL | Status: DC | PRN
  Administered 2019-06-02: 1000 mg via ORAL
  Filled 2019-06-01: qty 2

## 2019-06-01 NOTE — Progress Notes (Signed)
PROGRESS NOTE    [name]  [id number] DOB: 03/11/1971 DOA: 05/30/2019 PCP: Crecencio Mc, MD    Assessment & Plan:   Active Problems:   COVID-19   Hypoxia    [name] is a 48 y.o. AA female with medical history significant of diabetes,HTN, multiple drug allergies, asthma, migraine and history of palpitations who came to ED with worsening shortness of breath. She was diagnosed with COVID-19 infection on 05/22/2019.  She had an allergic reaction to Covid antibody infusion on 05/24/2019 at [facility] when she developed chest tightness, dyspnea, lightheadedness and nausea and was given Solu-Medrol, Benadryl, Pepcid and EpiPen.  Her symptoms resolved and she was sent home after that.  Patient developed worsening pleuritic chest pain and shortness of breath.  In the ED, pt was desaturating on room air to 88% requiring 2 L.   # Acute hypoxic respiratory failure 2/2 COVID-19 pneumonia.   # Pleuritic chest pain due to COVID PNA Patient's labs and chest x-ray consistent with COVID-19 pneumonia.  Requiring 2 L.  D-dimer mildly elevated above 500.  Patient was diagnosed with positive COVID-19 on 05/22/2019.  Patient had S1Q3 and T3 pattern.  This pattern was present on prior EKGs whenever seen after a allergic reaction.  Patient has anaphylaxis reaction with dyes therefore CTA was not done. -Started her on remdesivir and Decadron due to current hypoxia and chest x-ray with bilateral infiltrate.  Procal neg.  abx d/c'ed. PLAN: --continue Remdesivir and Decadron, Day 3 -Monitor inflammatory markers. -Vitamin C and zinc. --O2 weaned down to room air today.   --tylenol for pleuritic chest pain.  Off opioids in preparation for discharge home.  Hypokalemia, resolved Patient with potassium of 3.1. -replete electrolytes and monitor.  Diabetes.   A1c of 7.5 in October 2020. Continue home dose of Tresiba. -Add resistant scale SSI as patient is on steroids now.  History of  palpitations Being worked up by cardiologist and using Cardizem and metoprolol.  Palpitation now occurs with exertion and resolves with rest. -Continue home meds of Cardizem and metoprolol. -Continue aspirin and Lipitor. --No need for tele.  Hypertension.   Blood pressure on softer side. -Holding home losartan, can be restarted once blood pressure improves.   DVT prophylaxis: Lovenox SQ Code Status: Full code  Family Communication: not today Disposition Plan: home tomorrow   Subjective and Interval History:  Pt complained of heart palpitation with exertion.  Chest pain improved.  On room air now but gets dyspnea with exertion.  No fever, abdominal pain, N/V/D, dysuria, increased swelling.   Objective: Vitals:   05/31/19 2015 06/01/19 0448 06/01/19 0819 06/01/19 0850  BP: 122/80 104/69 126/68   Pulse: 60 74 78   Resp: 18 18    Temp: (!) 97.5 F (36.4 C) 97.8 F (36.6 C) 98.5 F (36.9 C)   TempSrc: Oral Oral Oral   SpO2: 100% 97%  96%  Weight:      Height:        Intake/Output Summary (Last 24 hours) at 06/01/2019 1341 Last data filed at 06/01/2019 1232 Gross per 24 hour  Intake 203 ml  Output 2600 ml  Net -2397 ml   Filed Weights   05/30/19 0943 05/31/19 0228  Weight: 98 kg 97.5 kg    Examination:   Constitutional: NAD, AAOx3 HEENT: conjunctivae and lids normal, EOMI CV: RRR no M,R,G. Distal pulses +2.  No cyanosis.  RESP: very reduced lung sounds, normal respiratory effort, on 2L GI: +BS, NTND Extremities: No  effusions, edema, or tenderness in BLE SKIN: warm, dry and intact Neuro: II - XII grossly intact.  Sensation intact Psych: Normal mood and affect.  Appropriate judgement and reason   Data Reviewed: I have personally reviewed following labs and imaging studies  CBC: Recent Labs  Lab 05/30/19 1004 05/31/19 0656 06/01/19 0430  WBC 9.3 7.7 8.2  NEUTROABS 5.2 5.9 6.4  HGB 13.4 13.2 14.2  HCT 39.9 40.2 41.9  MCV 85.1 87.8 84.5  PLT 187 197  [id number]   Basic Metabolic Panel: Recent Labs  Lab 05/30/19 1004 05/31/19 0656 06/01/19 0430  NA 134* 136 135  K 3.1* 4.7 3.8  CL 100 101 99  CO2 20* 25 24  GLUCOSE 183* 270* 337*  BUN 11 13 17   CREATININE 0.72 0.69 0.82  CALCIUM 9.0 8.9 9.1  MG  --  2.0 2.0  PHOS  --  2.8 3.7   GFR: Estimated Creatinine Clearance: 97.7 mL/min (by C-G formula based on SCr of 0.82 mg/dL). Liver Function Tests: Recent Labs  Lab 05/30/19 1004 05/31/19 0656 06/01/19 0430  AST 19 17 21   ALT 21 21 25   ALKPHOS 113 112 120  BILITOT 0.7 0.5 0.6  PROT 7.1 7.0 7.9  ALBUMIN 3.7 3.8 4.1   No results for input(s): LIPASE, AMYLASE in the last 168 hours. No results for input(s): AMMONIA in the last 168 hours. Coagulation Profile: No results for input(s): INR, PROTIME in the last 168 hours. Cardiac Enzymes: No results for input(s): CKTOTAL, CKMB, CKMBINDEX, TROPONINI in the last 168 hours. BNP (last 3 results) No results for input(s): PROBNP in the last 8760 hours. HbA1C: Recent Labs    05/30/19 1655  HGBA1C 8.5*   CBG: Recent Labs  Lab 05/31/19 1135 05/31/19 1708 05/31/19 2125 06/01/19 0817 06/01/19 1203  GLUCAP 291* 329* 254* 383* 320*   Lipid Profile: No results for input(s): CHOL, HDL, LDLCALC, TRIG, CHOLHDL, LDLDIRECT in the last 72 hours. Thyroid Function Tests: No results for input(s): TSH, T4TOTAL, FREET4, T3FREE, THYROIDAB in the last 72 hours. Anemia Panel: Recent Labs    05/31/19 0656 06/01/19 0430  FERRITIN 294 420*   Sepsis Labs: Recent Labs  Lab 05/30/19 1004 05/30/19 1215  PROCALCITON  --  <0.10  LATICACIDVEN 1.9  --     No results found for this or any previous visit (from the past 240 hour(s)).    Radiology Studies: No results found.   Scheduled Meds: . alum & mag hydroxide-simeth  30 mL Oral Once  . vitamin C  500 mg Oral Daily  . aspirin EC  81 mg Oral Daily  . atorvastatin  20 mg Oral QHS  . dexamethasone (DECADRON) injection  6 mg Intravenous  Q24H  . diltiazem  240 mg Oral Daily  . enoxaparin (LOVENOX) injection  40 mg Subcutaneous Q24H  . famotidine  20 mg Oral Daily  . hydrochlorothiazide  12.5 mg Oral Daily  . insulin aspart  0-20 Units Subcutaneous TID WC  . insulin aspart  0-5 Units Subcutaneous QHS  . insulin glargine  45 Units Subcutaneous Daily  . Ipratropium-Albuterol  1 puff Inhalation Q6H  . metoprolol succinate  12.5 mg Oral Daily  . sodium chloride flush  3 mL Intravenous Q12H  . Vitamin D (Ergocalciferol)  50,000 Units Oral Weekly  . zinc sulfate  220 mg Oral Daily   Continuous Infusions: . remdesivir 100 mg in NS 100 mL Stopped (06/01/19 0930)     LOS: 2 days  Enzo Bi, MD [facility] If 7PM-7AM, please contact night-coverage 06/01/2019, 1:41 PM

## 2019-06-02 LAB — CBC WITH DIFFERENTIAL/PLATELET
Abs Immature Granulocytes: 0.14 10*3/uL — ABNORMAL HIGH (ref 0.00–0.07)
Basophils Absolute: 0 10*3/uL (ref 0.0–0.1)
Basophils Relative: 0 %
Eosinophils Absolute: 0 10*3/uL (ref 0.0–0.5)
Eosinophils Relative: 0 %
HCT: 39.8 % (ref 36.0–46.0)
Hemoglobin: 13.2 g/dL (ref 12.0–15.0)
Immature Granulocytes: 2 %
Lymphocytes Relative: 20 %
Lymphs Abs: 1.3 10*3/uL (ref 0.7–4.0)
MCH: 28.9 pg (ref 26.0–34.0)
MCHC: 33.2 g/dL (ref 30.0–36.0)
MCV: 87.1 fL (ref 80.0–100.0)
Monocytes Absolute: 0.4 10*3/uL (ref 0.1–1.0)
Monocytes Relative: 6 %
Neutro Abs: 4.7 10*3/uL (ref 1.7–7.7)
Neutrophils Relative %: 72 %
Platelets: 233 10*3/uL (ref 150–400)
RBC: 4.57 MIL/uL (ref 3.87–5.11)
RDW: 12 % (ref 11.5–15.5)
Smear Review: NORMAL
WBC: 6.5 10*3/uL (ref 4.0–10.5)
nRBC: 0 % (ref 0.0–0.2)

## 2019-06-02 LAB — COMPREHENSIVE METABOLIC PANEL
ALT: 23 U/L (ref 0–44)
AST: 19 U/L (ref 15–41)
Albumin: 3.7 g/dL (ref 3.5–5.0)
Alkaline Phosphatase: 102 U/L (ref 38–126)
Anion gap: 11 (ref 5–15)
BUN: 18 mg/dL (ref 6–20)
CO2: 26 mmol/L (ref 22–32)
Calcium: 9.1 mg/dL (ref 8.9–10.3)
Chloride: 101 mmol/L (ref 98–111)
Creatinine, Ser: 0.86 mg/dL (ref 0.44–1.00)
GFR calc Af Amer: >60 mL/min (ref >60)
GFR calc non Af Amer: >60 mL/min (ref >60)
Glucose, Bld: 339 mg/dL — ABNORMAL HIGH (ref 70–99)
Potassium: 4.3 mmol/L (ref 3.5–5.1)
Sodium: 138 mmol/L (ref 135–145)
Total Bilirubin: 0.8 mg/dL (ref 0.3–1.2)
Total Protein: 7.1 g/dL (ref 6.5–8.1)

## 2019-06-02 LAB — GLUCOSE, CAPILLARY
Glucose-Capillary: 316 mg/dL — ABNORMAL HIGH (ref 70–99)
Glucose-Capillary: 317 mg/dL — ABNORMAL HIGH (ref 70–99)

## 2019-06-02 LAB — C-REACTIVE PROTEIN: CRP: 0.6 mg/dL (ref <1.0)

## 2019-06-02 LAB — FIBRIN DERIVATIVES D-DIMER (ARMC ONLY): Fibrin derivatives D-dimer (ARMC): 300.76 ng/mL (FEU) (ref 0.00–499.00)

## 2019-06-02 LAB — PHOSPHORUS: Phosphorus: 3.8 mg/dL (ref 2.5–4.6)

## 2019-06-02 LAB — FERRITIN: Ferritin: 380 ng/mL — ABNORMAL HIGH (ref 11–307)

## 2019-06-02 LAB — MAGNESIUM: Magnesium: 1.9 mg/dL (ref 1.7–2.4)

## 2019-06-02 MED ORDER — DEXAMETHASONE 2 MG PO TABS: ORAL_TABLET | ORAL | 0 refills | Status: DC

## 2019-06-02 MED ORDER — DEXAMETHASONE 4 MG PO TABS
4.0000 mg | ORAL_TABLET | Freq: Every day | ORAL | Status: DC
  Administered 2019-06-02: 4 mg via ORAL
  Filled 2019-06-02: qty 1

## 2019-06-02 MED ORDER — ATORVASTATIN CALCIUM 20 MG PO TABS: 20.0000 mg | ORAL_TABLET | Freq: Every day | ORAL | Status: DC

## 2019-06-02 MED ORDER — ERGOCALCIFEROL 1.25 MG (50000 UT) PO CAPS: 50000.0000 [IU] | ORAL_CAPSULE | ORAL | Status: DC

## 2019-06-02 MED ORDER — BENZONATATE 100 MG PO CAPS: 100.0000 mg | ORAL_CAPSULE | Freq: Three times a day (TID) | ORAL | 0 refills | Status: DC | PRN

## 2019-06-02 MED ORDER — DILTIAZEM HCL ER COATED BEADS 240 MG PO TB24: 240.0000 mg | ORAL_TABLET | Freq: Every day | ORAL | Status: DC

## 2019-06-02 MED ORDER — ALPRAZOLAM 0.5 MG PO TABS: 0.5000 mg | ORAL_TABLET | Freq: Three times a day (TID) | ORAL | Status: DC | PRN

## 2019-06-02 NOTE — Progress Notes (Addendum)
Pt gave herself bath this morning and tolerated well. some SOB noted, but pts sats was in mid 90's once back in the bed.

## 2019-06-02 NOTE — Discharge Summary (Signed)
Physician Discharge Summary   [name]  female DOB: 1970-06-25  [id number]  PCP: Crecencio Mc, MD  Admit date: 05/30/2019 Discharge date: 06/02/2019  Admitted From: home Disposition:  home CODE STATUS: Full code  Discharge Instructions    Diet - low sodium heart healthy   Complete by: As directed    Discharge instructions   Complete by: As directed    Since you have been taking steroids for a while, I have prescribed you a steroid taper with Decadron.  Please see instruction on the prescription.   You will need some more time to recover back to your normal.  You have been improving since admission, and are now on room air, so you are safe to go home to recover.  Dr. Enzo Bi   Increase activity slowly   Complete by: As directed        Hospital Course:  For full details, please see H&P, progress notes, consult notes and ancillary notes.  Briefly,  Stephanie Vandewalker Nicholsis a 48 y.o.AA femalewith medical history significant ofdiabetes,HTN,multiple drug allergies,asthma, migraine and history of palpitations who came to ED with worsening shortness of breath. She was diagnosed with COVID-19 infection on 05/22/2019. She had an allergic reaction [facility] antibody [facility] when she developed chest tightness, dyspnea, lightheadedness and nausea and was given Solu-Medrol, Benadryl, Pepcid and EpiPen. Her symptoms resolved and she was sent home after that.  Patient developed worsening pleuritic chest pain and shortness of breath and presented again to the ED on 05/30/19.In the ED, pt was desaturating on room air to 88% requiring 2 L.   # Acute hypoxic respiratory failure 2/2 COVID-19 pneumonia. # Pleuritic chest pain due to COVID PNA chest x-ray with bilateral infiltrate.  Requiring 2 L on presentation.  Patient was diagnosed with positive COVID-19 on 05/22/2019.Pt was started on remdesivir and Decadron, and received 4 days and then discharged  home on room air.  Pt received ceftriaxone and azithromycin on presentation for empiric tx of bacterial PNA, however, Procal neg, so abx d/c'ed.  Pt was taking prednisone 40 mg for her COVID prior to presentation, and received IV decadron 6 mg daily during this hospitalization.  Due to pt having been on steroid for some time, pt was discharged on a steroid taper with instruction written on the prescription (see below on med list).  Pt was ordered Percocet on admission for her pleuritic chest pain that worsened with deep breathing.  Chest pain improved, and pt was advised to take tylenol instead for her pleuritic chest pain.  Hypokalemia, resolved Repleted PRN.  Diabetes. A1c of 7.5 in October 2020.  Pt received SSI while inpatient, but was discharged back to her home diabetic regimen.  History of heart palpitations Had been seen by outpatient cardiologist and taking Cardizem and metoprolol.  Palpitation now occurs with exertion and resolves with rest.  Continued home Cardizem and metoprolol.  Hypertension. Continued home Cardizem, metop and HCTZ.  Blood pressure on softer side during hospitalization, so home losartan held while inpatient, but resumed after discharge.   Discharge Diagnoses:  Active Problems:   COVID-19   Hypoxia    Discharge Instructions:  Allergies as of 06/02/2019      Reactions   Botox [onabotulinumtoxina] Shortness Of Breath   syncope   Clindamycin/lincomycin Other (See Comments)   Kiwi Extract Shortness Of Breath   Maxalt [rizatriptan Benzoate] Anaphylaxis   Chest pain   Nitrous Oxide Shortness Of Breath   syncope  Triptans Shortness Of Breath   Aspirin Nausea And Vomiting   Mouth blisters   Contrast Media [iodinated Diagnostic Agents]    Flagyl [metronidazole]    Hydrocodone-acetaminophen Other (See Comments)   Latex    Sometimes causes rash   Mango Flavor    Rizatriptan Other (See Comments)   Vicodin [hydrocodone-acetaminophen]     hallucinations   Flu Virus Vaccine Palpitations   Penicillins Nausea And Vomiting, Rash, Other (See Comments)   Did it involve swelling of the face/tongue/throat, SOB, or low BP? No Did it involve sudden or severe rash/hives, skin peeling, or any reaction on the inside of your mouth or nose? No Did you need to seek medical attention at a hospital or doctor's office? Yes When did it last happen?patient was 48 years old If all above answers are "NO", may proceed with cephalosporin use.   Tetracyclines & Related Nausea And Vomiting, Rash      Medication List    STOP taking these medications   oxyCODONE-acetaminophen 5-325 MG tablet Commonly known as: Roxicet     TAKE these medications   Ajovy 225 MG/1.5ML Soaj Generic drug: Fremanezumab-vfrm Inject 675 mg into the skin every 3 (three) months.   albuterol 108 (90 Base) MCG/ACT inhaler Commonly known as: VENTOLIN HFA Inhale 2 puffs into the lungs every 6 (six) hours as needed for wheezing or shortness of breath.   ALPRAZolam 0.5 MG tablet Commonly known as: XANAX Take 1 tablet (0.5 mg total) by mouth 3 (three) times daily as needed (Headache).   aspirin EC 81 MG tablet Take 81 mg by mouth daily.   atorvastatin 20 MG tablet Commonly known as: LIPITOR Take 1 tablet (20 mg total) by mouth daily at 6 PM.   benzonatate 100 MG capsule Commonly known as: TESSALON Take 1 capsule (100 mg total) by mouth 3 (three) times daily as needed for cough. What changed:   when to take this  reasons to take this   butalbital-acetaminophen-caffeine 50-325-40 MG tablet Commonly known as: FIORICET Take 1 tablet by mouth every 6 (six) hours as needed for headache. Do not refill in less than 30 day   dexamethasone 2 MG tablet Commonly known as: DECADRON Take 2 tablets (4 mg total) for 3 days from 12/28 to 12/31.  Then 1 tablet (2 mg total) for 4 days from 06/07/19 to 06/10/19.  This is your steroid taper down.   diazepam 2 MG  tablet Commonly known as: VALIUM Take 2 mg by mouth every 6 (six) hours as needed (Headache).   diltiazem 240 MG 24 hr tablet Commonly known as: CARDIZEM LA Take 1 tablet (240 mg total) by mouth daily.   EPINEPHrine 0.3 mg/0.3 mL Soaj injection Commonly known as: EPI-PEN Inject 0.3 mLs (0.3 mg total) into the muscle as needed (for allergic reaction).   ergocalciferol 1.25 MG (50000 UT) capsule Commonly known as: VITAMIN D2 Take 1 capsule (50,000 Units total) by mouth once a week.   hydrochlorothiazide 12.5 MG tablet Commonly known as: HYDRODIURIL Take 12.5 mg by mouth daily.   ketorolac 10 MG tablet Commonly known as: TORADOL Take 1 tablet (10 mg total) by mouth every 6 (six) hours as needed. What changed: reasons to take this   losartan 50 MG tablet Commonly known as: COZAAR Take 50 mg by mouth daily.   metFORMIN 500 MG 24 hr tablet Commonly known as: GLUCOPHAGE-XR Take 2 tablets (1,000 mg total) by mouth 2 (two) times daily.   metoprolol succinate 25 MG 24 hr  tablet Commonly known as: TOPROL-XL Take 0.5 tablets (12.5 mg total) by mouth daily.   ondansetron 4 MG disintegrating tablet Commonly known as: Zofran ODT Take 1 tablet (4 mg total) by mouth every 8 (eight) hours as needed for nausea or vomiting.   phentermine 37.5 MG capsule Take 37.5 mg by mouth every morning.       Follow-up Information    Crecencio Mc, MD Follow up in 1 week(s).   Specialty: Internal Medicine Contact information: [place] [address] [phone]        Nelva Bush, MD .   Specialty: Cardiology Contact information: [address] [place] [address] [phone]           Allergies  Allergen Reactions  . Botox [Onabotulinumtoxina] Shortness Of Breath    syncope  . Clindamycin/Lincomycin Other (See Comments)  . Kiwi Extract Shortness Of Breath  . Maxalt [Rizatriptan Benzoate] Anaphylaxis    Chest pain  . Nitrous  Oxide Shortness Of Breath    syncope  . Triptans Shortness Of Breath  . Aspirin Nausea And Vomiting    Mouth blisters  . Contrast Media [Iodinated Diagnostic Agents]   . Flagyl [Metronidazole]   . Hydrocodone-Acetaminophen Other (See Comments)  . Latex     Sometimes causes rash  . Mango Flavor   . Rizatriptan Other (See Comments)  . Vicodin [Hydrocodone-Acetaminophen]     hallucinations  . Flu Virus Vaccine Palpitations  . Penicillins Nausea And Vomiting, Rash and Other (See Comments)    Did it involve swelling of the face/tongue/throat, SOB, or low BP? No Did it involve sudden or severe rash/hives, skin peeling, or any reaction on the inside of your mouth or nose? No Did you need to seek medical attention at a hospital or doctor's office? Yes When did it last happen?patient was 48 years old If all above answers are "NO", may proceed with cephalosporin use.  . Tetracyclines & Related Nausea And Vomiting and Rash     The results of significant diagnostics from this hospitalization (including imaging, microbiology, ancillary and laboratory) are listed below for reference.   Consultations:   Procedures/Studies: DG Chest Portable 1 View  Result Date: 05/30/2019 CLINICAL DATA:  Chest pain and shortness of breath. COVID-19 positive EXAM: PORTABLE CHEST 1 VIEW COMPARISON:  July 01, 2018 FINDINGS: There is focal airspace opacity in the lateral left base. The lungs elsewhere are clear. Heart size and pulmonary vascular normal. No adenopathy. No bone lesions. IMPRESSION: Airspace opacity consistent with pneumonia left base. Lungs elsewhere clear. Cardiac silhouette normal. No adenopathy. Electronically Signed   By: Lowella Grip III M.D.   On: 05/30/2019 10:15   MM 3D SCREEN BREAST BILATERAL  Result Date: 05/22/2019 CLINICAL DATA:  Screening. EXAM: DIGITAL SCREENING BILATERAL MAMMOGRAM WITH TOMO AND CAD COMPARISON:  Previous exam(s). ACR Breast Density Category b: There are  scattered areas of fibroglandular density. FINDINGS: There are no findings suspicious for malignancy. Images were processed with CAD. IMPRESSION: No mammographic evidence of malignancy. A result letter of this screening mammogram will be mailed directly to the patient. RECOMMENDATION: Screening mammogram in one year. (Code:SM-B-01Y) BI-RADS CATEGORY  1: Negative. Electronically Signed   By: Lillia Mountain M.D.   On: 05/22/2019 11:37      Labs: BNP (last 3 results) No results for input(s): BNP in the last 8760 hours. Basic Metabolic Panel: Recent Labs  Lab 05/30/19 1004 05/31/19 0656 06/01/19 0430 06/02/19 0449  NA 134* 136  135 138  K 3.1* 4.7 3.8 4.3  CL 100 101 99 101  CO2 20* 25 24 26   GLUCOSE 183* 270* 337* 339*  BUN 11 13 17 18   CREATININE 0.72 0.69 0.82 0.86  CALCIUM 9.0 8.9 9.1 9.1  MG  --  2.0 2.0 1.9  PHOS  --  2.8 3.7 3.8   Liver Function Tests: Recent Labs  Lab 05/30/19 1004 05/31/19 0656 06/01/19 0430 06/02/19 0449  AST 19 17 21 19   ALT 21 21 25 23   ALKPHOS 113 112 120 102  BILITOT 0.7 0.5 0.6 0.8  PROT 7.1 7.0 7.9 7.1  ALBUMIN 3.7 3.8 4.1 3.7   No results for input(s): LIPASE, AMYLASE in the last 168 hours. No results for input(s): AMMONIA in the last 168 hours. CBC: Recent Labs  Lab 05/30/19 1004 05/31/19 0656 06/01/19 0430 06/02/19 0449  WBC 9.3 7.7 8.2 6.5  NEUTROABS 5.2 5.9 6.4 4.7  HGB 13.4 13.2 14.2 13.2  HCT 39.9 40.2 41.9 39.8  MCV 85.1 87.8 84.5 87.1  PLT 187 197 248 233   Cardiac Enzymes: No results for input(s): CKTOTAL, CKMB, CKMBINDEX, TROPONINI in the last 168 hours. BNP: Invalid input(s): POCBNP CBG: Recent Labs  Lab 06/01/19 1203 06/01/19 1707 06/01/19 2107 06/02/19 0735 06/02/19 1152  GLUCAP 320* 336* 292* 316* 317*   D-Dimer No results for input(s): DDIMER in the last 72 hours. Hgb A1c No results for input(s): HGBA1C in the last 72 hours. Lipid Profile No results for input(s): CHOL, HDL, LDLCALC, TRIG, CHOLHDL,  LDLDIRECT in the last 72 hours. Thyroid function studies No results for input(s): TSH, T4TOTAL, T3FREE, THYROIDAB in the last 72 hours.  Invalid input(s): FREET3 Anemia work up Recent Labs    06/02/19 0449  FERRITIN 380*   Urinalysis    Component Value Date/Time   COLORURINE YELLOW (A) 06/22/2017 1325   APPEARANCEUR CLEAR (A) 06/22/2017 1325   APPEARANCEUR Cloudy 06/14/2012 1524   LABSPEC 1.018 06/22/2017 1325   LABSPEC 1.015 06/14/2012 1524   PHURINE 5.0 06/22/2017 1325   GLUCOSEU NEGATIVE 06/22/2017 1325   GLUCOSEU Negative 06/14/2012 1524   HGBUR NEGATIVE 06/22/2017 1325   BILIRUBINUR small (A) 07/01/2018 1614   BILIRUBINUR Negative 06/14/2012 1524   KETONESUR trace (5) (A) 07/01/2018 1614   KETONESUR NEGATIVE 06/22/2017 1325   PROTEINUR =30 (A) 07/01/2018 1614   PROTEINUR NEGATIVE 06/22/2017 1325   UROBILINOGEN 1.0 07/01/2018 1614   UROBILINOGEN 0.2 09/14/2013 1938   NITRITE Negative 07/01/2018 1614   NITRITE NEGATIVE 06/22/2017 1325   LEUKOCYTESUR Negative 07/01/2018 1614   LEUKOCYTESUR Negative 06/14/2012 1524   Sepsis Labs Invalid input(s): PROCALCITONIN,  WBC,  LACTICIDVEN Microbiology No results found for this or any previous visit (from the past 240 hour(s)).   Total time spend on discharging this patient, including the last patient exam, discussing the hospital stay, instructions for ongoing care as it relates to all pertinent caregivers, as well as preparing the medical discharge records, prescriptions, and/or referrals as applicable, is 40 minutes.    Enzo Bi, MD  [facility] 06/04/2019, 3:45 PM  If 7PM-7AM, please contact night-coverage

## 2019-06-03 ENCOUNTER — Encounter: Payer: Self-pay | Admitting: Endocrinology

## 2019-06-03 ENCOUNTER — Telehealth: Payer: Self-pay

## 2019-06-03 DIAGNOSIS — Z0279 Encounter for issue of other medical certificate: Secondary | ICD-10-CM

## 2019-06-03 NOTE — Telephone Encounter (Signed)
Transition Care Management Follow-up Telephone Call   Date discharged? 06/02/19   How have you been since you were released from the hospital?Patient states, "I am still having SOB because I have been a little more active, using rescue inhaler every 6 hours. Oxygen 98-100% after 2-3 minutes of exertion. No home oxygen needed. Chest still feels tight, but I will start the steroid today." Denies fever, nausea, vomiting, diarrhea.    Do you understand why you were in the hospital? Yes.   Do you understand the discharge instructions? Yes, increase activity as tolerated. Rest, hydrate and take the steroid.    Where were you discharged to? Home.   Items Reviewed:  Medications reviewed: Yes, taking all scheduled medications as directed.   Allergies reviewed: Yes  Dietary changes reviewed: Yes, low sodium heart healthy.   Referrals reviewed: Yes, Cardiology and Endocrinology.    Functional Questionnaire:   Activities of Daily Living (ADLs):   She states they are independent in the following: Independent in all ADLs.  States they require assistance with the following: No assistance required at this time.    Any transportation issues/concerns?: None at this time.    Any patient concerns? Increased blood sugars with steroids. Following up with Endocrinology, Dr. Loanne Drilling.   Confirmed importance and date/time of follow-up visits scheduled Yes, appointment scheduled 06/05/19 @ 11:30 Doxy with [email or web address].  Provider Appointment booked with Dr. Derrel Nip, pcp.  Confirmed with patient if condition begins to worsen call PCP or go to the ER.  Patient was given the office number and encouraged to call back with question or concerns.  : Yes

## 2019-06-04 ENCOUNTER — Other Ambulatory Visit: Payer: Self-pay | Admitting: *Deleted

## 2019-06-04 ENCOUNTER — Encounter: Payer: Self-pay | Admitting: *Deleted

## 2019-06-04 ENCOUNTER — Telehealth: Payer: Self-pay

## 2019-06-04 ENCOUNTER — Other Ambulatory Visit: Payer: Self-pay

## 2019-06-04 DIAGNOSIS — R5382 Chronic fatigue, unspecified: Secondary | ICD-10-CM | POA: Diagnosis not present

## 2019-06-04 DIAGNOSIS — E1165 Type 2 diabetes mellitus with hyperglycemia: Secondary | ICD-10-CM

## 2019-06-04 MED ORDER — TRESIBA FLEXTOUCH 100 UNIT/ML ~~LOC~~ SOPN: 45.0000 [IU] | PEN_INJECTOR | Freq: Every day | SUBCUTANEOUS | 2 refills | Status: DC

## 2019-06-04 MED FILL — TRESIBA FLEXTOUCH 100 UNITS: 100 | 87 days supply | Qty: 39 | Fill #0

## 2019-06-04 NOTE — Patient Outreach (Addendum)
[facility]) Care Management  06/04/2019  [name] July 28, 1970 [id number]  Transition of care call/case closure   Referral received:06/03/19 Initial outreach:06/04/19 Insurance: [place] [employer]    Subjective: Initial successful telephone call to patient's preferred number in order to complete transition of care assessment; 2 HIPAA identifiers verified. Explained purpose of call and completed transition of care assessment.  [name] states she is still has very low energy, has to rest after activity in home. She denies having a cough, has shortness of breath with activity, resolves with rest, she does not have a pulse oximeter at home for use . She denies having temperature.  She discussed that she still doesn't have her taste and smell back yet, she is focusing on drinking fluids, water mostly due to thirst  some gatorade,tolerating protein such as peanut butter, nuts, fresh fruits  She discussed being on decadron taper and it is causing elevations in blood sugar as well as palpitations, blood sugar   Up to 351,she is monitoring it 3 to 4 times a day, she has contacted Dr. Loanne Drilling office regarding readings and adjustments in [place] have been made to begin today 70 units and  next available appointment scheduled, reinforced to notify MD again for continued elevations for recommendations and keeping a record of readings.  She reports blood pressure reading 104/60 she reports working with cardiology her blood pressure is better controlled.  She discussed being a Therapist, sports at health at work , receiving daily calls from health at work and they will help determine when she will be able to return to work, on quarantine until 06/11/19 Patient daughter and son  are assisting with her recovery.  Discussed assessing [name] Benefits:  She discussed being active with Active health management condition help condition management program for managing diabetes, hypertension.  She does have   hospital indemnity plan has contact number and will file a claim. She discussed being in contact with benefits office regarding short term disability benefits and covid pay. She She  uses a [name].  She denies educational needs related to staying safe during the COVID 19 pandemic.   Objective:  [name]  was hospitalized at  [facility] from 12/24-12/27/20  For Covid 19 Pneumonia, covid positive on 12/16 Comorbidities include: Hypertension, Diabetes, A1c8.5 on 12/24, palpitations, Migraines, Allergic reaction to covid antibody infusion  She was discharged to home on 06/02/19 without the need for home health services or DME.   Assessment:  Patient voices good understanding of all discharge instructions.  See transition of care flowsheet for assessment details.   Plan:  Reviewed hospital discharge diagnosis of  covid 19    and discharge treatment plan using hospital discharge instructions, assessing medication adherence, reviewing problems requiring provider notification, and discussing the importance of follow up with surgeon, primary care provider and/or specialists as directed. Reviewed [place] healthy lifestyle program information to keep premiums low for 2022:  Step 1: Get annual physical between June 06, 2018 and December 05, 2019; Step 2: Complete your health assessment between June 07, 2019 and February 05, 2020 at [email or web address] Step 3:Identify your current health status and complete the corresponding action step between January 1, and February 05, 2020.   Using [place] website, verified that patient is an active participate in 's Active Health Management chronic disease management program.    No ongoing care management needs identified so will close case to [facility] Care Management services and route successful outreach letter with  [place] Management pamphlet and 24 Hour Nurse Line Magnet to [place] Management clinical pool to be mailed to patient's home address. Thanked patient for their services to [facility].  Stephanie Draft, RN, [place] Management Coordinator  [phone]- Mobile [phone]- Toll Free Main Office

## 2019-06-04 NOTE — Telephone Encounter (Signed)
MEDICATION: insulin degludec (TRESIBA FLEXTOUCH) 100 UNIT/ML SOPN FlexTouch Pen  Test Strips    PHARMACY:  [place], [place] :   IS PATIENT OUT OF MEDICATION:   IF NOT; HOW MUCH IS LEFT:   LAST APPOINTMENT DATE: @12 /03/2019  NEXT APPOINTMENT DATE:@1 /04/2020  DO WE HAVE YOUR PERMISSION TO LEAVE A DETAILED MESSAGE:  OTHER COMMENTS:    **Let patient know to contact pharmacy at the end of the day to make sure medication is ready. **  ** Please notify patient to allow 48-72 hours to process**  **Encourage patient to contact the pharmacy for refills or they can request refills through [facility]**

## 2019-06-04 NOTE — Telephone Encounter (Signed)
Rx sent 

## 2019-06-05 ENCOUNTER — Encounter: Payer: Self-pay | Admitting: Internal Medicine

## 2019-06-05 ENCOUNTER — Other Ambulatory Visit: Payer: Self-pay

## 2019-06-05 ENCOUNTER — Ambulatory Visit (INDEPENDENT_AMBULATORY_CARE_PROVIDER_SITE_OTHER): Payer: 59 | Admitting: Internal Medicine

## 2019-06-05 DIAGNOSIS — E1165 Type 2 diabetes mellitus with hyperglycemia: Secondary | ICD-10-CM

## 2019-06-05 DIAGNOSIS — R9431 Abnormal electrocardiogram [ECG] [EKG]: Secondary | ICD-10-CM

## 2019-06-05 DIAGNOSIS — U071 COVID-19: Secondary | ICD-10-CM

## 2019-06-05 DIAGNOSIS — J1282 Pneumonia due to coronavirus disease 2019: Secondary | ICD-10-CM

## 2019-06-05 DIAGNOSIS — Z09 Encounter for follow-up examination after completed treatment for conditions other than malignant neoplasm: Secondary | ICD-10-CM

## 2019-06-05 DIAGNOSIS — T50Z95S Adverse effect of other vaccines and biological substances, sequela: Secondary | ICD-10-CM

## 2019-06-05 DIAGNOSIS — J1289 Other viral pneumonia: Secondary | ICD-10-CM

## 2019-06-05 MED ORDER — INSULIN LISPRO (1 UNIT DIAL) 100 UNIT/ML (KWIKPEN): 5.0000 [IU] | PEN_INJECTOR | Freq: Three times a day (TID) | SUBCUTANEOUS | 11 refills | Status: DC

## 2019-06-05 MED ORDER — BUTALBITAL-APAP-CAFFEINE 50-325-40 MG PO TABS: 1.0000 | ORAL_TABLET | Freq: Four times a day (QID) | ORAL | 1 refills | Status: DC | PRN

## 2019-06-05 MED FILL — BUTALB-ACETAMIN-CAFF 50-325: 50-325-40 | 30 days supply | Qty: 30 | Fill #0

## 2019-06-05 MED FILL — HUMALOG 100 UNITS/ML KWIKPE: 100 | 80 days supply | Qty: 12 | Fill #0

## 2019-06-05 NOTE — Progress Notes (Signed)
Virtual Visit via [email or web address]  This visit type was conducted due to national recommendations for restrictions regarding the COVID-19 pandemic (e.g. social distancing).  This format is felt to be most appropriate for this patient at this time.  All issues noted in this document were discussed and addressed.  No physical exam was performed (except for noted visual exam findings with Video Visits).   I connected with@ on 06/05/19 at 11:00 AM EST by a video enabled telemedicine application and verified that I am speaking with the correct person using two identifiers. Location patient: home Location provider: work or home office Persons participating in the virtual visit: patient, provider  I discussed the limitations, risks, security and privacy concerns of performing an evaluation and management service by telephone and the availability of in person appointments. I also discussed with the patient that there may be a patient responsible charge related to this service. The patient expressed understanding and agreed to proceed.  Reason for visit: hospital follow up  HPI:  48 yr old female with Type 2 DM, hypertension and chronic migraines hospitalized from Dec 24 to Dec 27 with hypoxic respiratory failure secondary to COVID 19 infection.  She was diagnosed on Dec 16, suffered an severe allergic reaction to the antibody infusion she received on Dec 18,  requiring treatment in ER with epinephrine,  Steroids.   Later that week she developed pleurisy and hypoxia and was admitted on Dec 24  With bilateral pulmonary infiltrates . She received supplemental oxygen, Remdesivir and Decadron as well as 24 hours of empiric antibiotics (ceftriaxone and azithromycin).  Her Hypoxia and pleurisy resolved prior to discharge on Dec 27      She was discharged home on a steroid taper , which she is taking .  She  Feels "run over by a bus" and his having palpitations,  Flushing, headache  And increased thirst. Still having  dyspnea with exertion but her sats on room air are > 92% and pulse is < 100. She has lost 12 lbs unintentionally. Her sens of smell and taste have not returned .  A1c jumped from 7.2 to 8.5 and BS have been ranging from 250 to 360.  Her endocrinologist increased her tresiba to 75 units.   ROS: See pertinent positives and negatives per HPI.  Past Medical History:  Diagnosis Date  . Allergy   . Anemia   . Anxiety    claustrophobic  . Asthma   . Back pain   . Biceps tendonosis of right shoulder   . Diabetes mellitus 2011   did not start metforfin, losing weight  . Dyspnea   . Fatty liver   . Food allergy   . Headache disorder   . History of concussion   . HLD (hyperlipidemia)   . Hypertension   . IBS (irritable bowel syndrome)   . Infertility, female   . Joint pain   . Lactose intolerance   . Leg edema   . Migraines   . Other specified disorders of thyroid   . Palpitation   . Post-menopausal   . Seborrheic dermatitis   . Shoulder impingement syndrome, right   . Vitamin D deficiency     Past Surgical History:  Procedure Laterality Date  . ABDOMINAL HYSTERECTOMY  2006   heavy menses, endometriosis, l oophrectomy  . BACK SURGERY    . BREAST BIOPSY Right 2018   benign  . BREAST EXCISIONAL BIOPSY Right 2003   benign  . BREAST EXCISIONAL BIOPSY Right 1999  benign  . BREAST SURGERY     right breast x 2 , benign  . HERNIA REPAIR  2003   left inguinal   . LEFT OOPHORECTOMY    . SHOULDER ARTHROSCOPY WITH SUBACROMIAL DECOMPRESSION AND OPEN ROTATOR C Right 07/14/2016   Procedure: RIGHT SHOULDER ARTHROSCOPY WITH SUBACROMIAL DECOMPRESSION, DISTAL CLAVICLE RESECTION AND MINI OPEN ROTATOR CUFF REPAIR, OPEN BICEP TENDODESIS;  Surgeon: Garald Balding, MD;  Location: [facility];  Service: Orthopedics;  Laterality: Right;  . SHOULDER CLOSED REDUCTION Right 09/08/2016   Procedure: RIGHT CLOSED MANIPULATION SHOULDER;  Surgeon: Garald Balding, MD;  Location: [facility];  Service: Orthopedics;  Laterality: Right;  . TUBAL LIGATION      Family History  Problem Relation Age of Onset  . Diabetes Mother   . Heart disease Mother 49  . Hypertension Mother   . Hyperlipidemia Mother   . Heart failure Mother   . Stroke Mother   . Thyroid disease Mother   . Depression Mother   . Sleep apnea Mother   . Obesity Mother   . Cancer Father 73       Lung Cancer  . Heart disease Father   . Mental illness Sister        bipolar, substance abuse,  clean 2 yrs  . Hypertension Sister   . Cancer Paternal Grandmother 27       breast cancer  . Breast cancer Paternal Grandmother   . Diabetes Maternal Grandmother   . Hypertension Maternal Grandmother   . Diabetes Son     SOCIAL HX:  reports that she has never smoked. She has never used smokeless tobacco. She reports that she does not drink alcohol or use drugs.   Current Outpatient Medications:  .  albuterol (VENTOLIN HFA) 108 (90 Base) MCG/ACT inhaler, Inhale 2 puffs into the lungs every 6 (six) hours as needed for wheezing or shortness of breath., Disp: 18 g, Rfl: 0 .  ALPRAZolam (XANAX) 0.5 MG tablet, Take 1 tablet (0.5 mg total) by mouth 3 (three) times daily as needed (Headache)., Disp: , Rfl:  .  aspirin EC 81 MG tablet, Take 81 mg by mouth daily., Disp: , Rfl:  .  atorvastatin (LIPITOR) 20 MG tablet, Take 1 tablet (20 mg total) by mouth daily at 6 PM., Disp: , Rfl:  .  butalbital-acetaminophen-caffeine (FIORICET) 50-325-40 MG tablet, Take 1 tablet by mouth every 6 (six) hours as needed for headache. Do not refill in less than 30 day, Disp: 30 tablet, Rfl: 1 .  dexamethasone (DECADRON) 2 MG tablet, Take 2 tablets (4 mg total) for 3 days from 12/28 to 12/31.  Then 1 tablet (2 mg total) for 4 days from 06/07/19 to 06/10/19.  This is your steroid taper down., Disp: 10 tablet, Rfl: 0 .  diazepam (VALIUM) 2 MG tablet, Take 2 mg by mouth every 6 (six) hours as needed (Headache). , Disp: , Rfl:  .   diltiazem (CARDIZEM LA) 240 MG 24 hr tablet, Take 1 tablet (240 mg total) by mouth daily., Disp: , Rfl:  .  EPINEPHrine 0.3 mg/0.3 mL IJ SOAJ injection, Inject 0.3 mLs (0.3 mg total) into the muscle as needed (for allergic reaction)., Disp: 1 each, Rfl: 3 .  ergocalciferol (VITAMIN D2) 1.25 MG (50000 UT) capsule, Take 1 capsule (50,000 Units total) by mouth once a week., Disp:  , Rfl:  .  Fremanezumab-vfrm (AJOVY) 225 MG/1.5ML SOAJ, Inject 675 mg into the skin every 3 (three)  months., Disp: 3 pen, Rfl: 3 .  hydrochlorothiazide (HYDRODIURIL) 12.5 MG tablet, Take 12.5 mg by mouth daily., Disp: , Rfl:  .  insulin degludec (TRESIBA FLEXTOUCH) 100 UNIT/ML SOPN FlexTouch Pen, Inject 0.45 mLs (45 Units total) into the skin daily. (Patient taking differently: Inject 75 Units into the skin daily. ), Disp: 41 mL, Rfl: 2 .  ketorolac (TORADOL) 10 MG tablet, Take 1 tablet (10 mg total) by mouth every 6 (six) hours as needed. (Patient taking differently: Take 10 mg by mouth every 6 (six) hours as needed (Headache). ), Disp: 30 tablet, Rfl: 1 .  losartan (COZAAR) 50 MG tablet, Take 50 mg by mouth daily., Disp: , Rfl:  .  metFORMIN (GLUCOPHAGE-XR) 500 MG 24 hr tablet, Take 2 tablets (1,000 mg total) by mouth 2 (two) times daily., Disp: 360 tablet, Rfl: 2 .  metoprolol succinate (TOPROL-XL) 25 MG 24 hr tablet, Take 0.5 tablets (12.5 mg total) by mouth daily., Disp: 45 tablet, Rfl: 3 .  ondansetron (ZOFRAN ODT) 4 MG disintegrating tablet, Take 1 tablet (4 mg total) by mouth every 8 (eight) hours as needed for nausea or vomiting., Disp: 20 tablet, Rfl: 0 .  phentermine 37.5 MG capsule, Take 37.5 mg by mouth every morning., Disp: , Rfl:  .  benzonatate (TESSALON) 100 MG capsule, Take 1 capsule (100 mg total) by mouth 3 (three) times daily as needed for cough. (Patient not taking: Reported on 06/05/2019), Disp: 21 capsule, Rfl: 0 .  fluconazole (DIFLUCAN) 150 MG tablet, Take 1 tablet (150 mg total) by mouth daily. Take one  tablet today.  May repeat in 3 days., Disp: 2 tablet, Rfl: 0 .  insulin lispro (HUMALOG KWIKPEN) 100 UNIT/ML KwikPen, Inject 0.05 mLs (5 Units total) into the skin 3 (three) times daily., Disp: 15 mL, Rfl: 11  EXAM:  VITALS per patient if applicable:  GENERAL: alert, oriented, appears well and in no acute distress  HEENT: atraumatic, conjunttiva clear, no obvious abnormalities on inspection of external nose and ears  NECK: normal movements of the head and neck  LUNGS: on inspection no signs of respiratory distress, breathing rate appears normal, no obvious gross SOB, gasping or wheezing  CV: no obvious cyanosis  MS: moves all visible extremities without noticeable abnormality  PSYCH/NEURO: pleasant and cooperative, no obvious depression or anxiety, speech and thought processing grossly intact  ASSESSMENT AND PLAN:  Discussed the following assessment and plan:  Uncontrolled type 2 diabetes mellitus with hyperglycemia (HCC)  Adverse reaction to vaccine, sequela  COVID-19  Abnormal electrocardiogram (ECG) (EKG)  Hospital discharge follow-up  Pneumonia due to COVID-19 virus - Plan: DG Chest 2 View  Uncontrolled type 2 diabetes mellitus ([place]) BS elevated due to steroids  Despite increase in [place] dose  By endocrinologist  Loanne Drilling) b to  75 units.  Adding 5 units Humalog tid ac , advised to increase her dose by 3   units every 3 days for goal post prandial CB 160 or less  Adverse reaction to vaccine, sequela She had an anaphylactic reaction to the COVID antibody infusion and required epinpehrine and Decadron   COVID-19 Diagnosed Dec 16.  Anaphylaxis  to antibody infusion Dec 18, required epinephrine and steroids.  Admitted with hypoxic respiratory failure, bilateral pulmonary infiltrates,  Pleurisy, on  dec 24.  Hypoxia resolved and she was discharged on Dec 27 on steroid taper .   Abnormal electrocardiogram (ECG) (EKG) Secondary to anaphylaxis : S1Q3T3 pattern noted  during ER visit and again during admission  Hospital discharge follow-up Patient is stable post discharge from [facility] on Dec 27.  Her hyperglycemia was addressed with the addition of mealtime insulin . She has no other new issues or questions about her discharge plans.   Pneumonia due to COVID-19 virus Continue use of albuterol MDI, cough suppressants for bilateral pulmonary infiltrates noted on admission chest  Ray .  Repeat chest x ray will be needed in 4-6 weeks     I discussed the assessment and treatment plan with the patient. The patient was provided an opportunity to ask questions and all were answered. The patient agreed with the plan and demonstrated an understanding of the instructions.   The patient was advised to call back or seek an in-person evaluation if the symptoms worsen or if the condition fails to improve as anticipated.  I provided  40 minutes of non-face-to-face time during this encounter.   Crecencio Mc, MD

## 2019-06-05 NOTE — Progress Notes (Signed)
Still having to use the albuterol inhaler about 4 times daily.   Hot flashes, headache, trouble breathing, having heart palpitations, weakness, severe fatigue, severely thirsty.   Pt stated that she is laying in the bed and having to focus on her her breathing.

## 2019-06-05 NOTE — Assessment & Plan Note (Addendum)
BS elevated due to steroids  Despite increase in [place] dose  By endocrinologist  Loanne Drilling) b to  75 units.  Adding 5 units Humalog tid ac , advised to increase her dose by 3   units every 3 days for goal post prandial CB 160 or less

## 2019-06-06 ENCOUNTER — Ambulatory Visit (INDEPENDENT_AMBULATORY_CARE_PROVIDER_SITE_OTHER)
Admission: RE | Admit: 2019-06-06 | Discharge: 2019-06-06 | Disposition: A | Discharge status: Home / Self Care | Payer: 59 | Source: Ambulatory Visit

## 2019-06-06 DIAGNOSIS — N898 Other specified noninflammatory disorders of vagina: Secondary | ICD-10-CM

## 2019-06-06 MED ORDER — FLUCONAZOLE 150 MG PO TABS: 150.0000 mg | ORAL_TABLET | Freq: Every day | ORAL | 0 refills | Status: DC

## 2019-06-06 NOTE — Discharge Instructions (Signed)
Take the Diflucan as directed.    Come here to be seen in person or follow-up with your primary care provider if your symptoms are not improving.

## 2019-06-06 NOTE — ED Provider Notes (Signed)
Virtual Visit via Video Note:  [name]  initiated request for Telemedicine visit with [facility] team. I connected with [name]  on 06/06/2019 at 11:23 AM  for a synchronized telemedicine visit using a video enabled HIPPA compliant telemedicine application. I verified that I am speaking with [name]  using two identifiers. Sharion Balloon, NP  was physically located in a [facility] Urgent care site and MAO HAMON was located at a different location.   The limitations of evaluation and management by telemedicine as well as the availability of in-person appointments were discussed. Patient was informed that she  may incur a bill ( including co-pay) for this virtual visit encounter. [name]  expressed understanding and gave verbal consent to proceed with virtual visit.     History of Present Illness:[name]  is a 48 y.o. female presents for evaluation of vaginal irritation and redness x 3-4 days  Attempted treatment with Desitin cream.  Patient states this feels like her previous episodes of Candida.  She has recently been in the hospital and on antibiotics which she believes are causing this possible yeast infection.  Denies fever, chills, vaginal discharge, abdominal pain, pelvic pain, dysuria, back pain, or other symptoms.     Allergies  Allergen Reactions  . Botox [Onabotulinumtoxina] Shortness Of Breath    syncope  . Clindamycin/Lincomycin Other (See Comments)  . Kiwi Extract Shortness Of Breath  . Maxalt [Rizatriptan Benzoate] Anaphylaxis    Chest pain  . Nitrous Oxide Shortness Of Breath    syncope  . Triptans Shortness Of Breath  . Aspirin Nausea And Vomiting    Mouth blisters  . Contrast Media [Iodinated Diagnostic Agents]   . Flagyl [Metronidazole]   . Hydrocodone-Acetaminophen Other (See Comments)  . Latex     Sometimes causes rash  . Mango Flavor   . Rizatriptan Other (See Comments)  . Vicodin  [Hydrocodone-Acetaminophen]     hallucinations  . Flu Virus Vaccine Palpitations  . Penicillins Nausea And Vomiting, Rash and Other (See Comments)    Did it involve swelling of the face/tongue/throat, SOB, or low BP? No Did it involve sudden or severe rash/hives, skin peeling, or any reaction on the inside of your mouth or nose? No Did you need to seek medical attention at a hospital or doctor's office? Yes When did it last happen?patient was 48 years old If all above answers are "NO", may proceed with cephalosporin use.  . Tetracyclines & Related Nausea And Vomiting and Rash     Past Medical History:  Diagnosis Date  . Allergy   . Anemia   . Anxiety    claustrophobic  . Asthma   . Back pain   . Biceps tendonosis of right shoulder   . Diabetes mellitus 2011   did not start metforfin, losing weight  . Dyspnea   . Fatty liver   . Food allergy   . Headache disorder   . History of concussion   . HLD (hyperlipidemia)   . Hypertension   . IBS (irritable bowel syndrome)   . Infertility, female   . Joint pain   . Lactose intolerance   . Leg edema   . Migraines   . Other specified disorders of thyroid   . Palpitation   . Post-menopausal   . Seborrheic dermatitis   . Shoulder impingement syndrome, right   . Vitamin D deficiency      Social History   Tobacco  Use  . Smoking status: Never Smoker  . Smokeless tobacco: Never Used  Substance Use Topics  . Alcohol use: No  . Drug use: No        Observations/Objective: Physical Exam  VITALS: Patient denies fever. GENERAL: Alert, appears well and in no acute distress. HEENT: Atraumatic. NECK: Normal movements of the head and neck. CARDIOPULMONARY: No increased WOB. Speaking in clear sentences. I:E ratio WNL.  MS: Moves all visible extremities without noticeable abnormality. PSYCH: Pleasant and cooperative, well-groomed. Speech normal rate and rhythm. Affect is appropriate. Insight and judgement are appropriate.  Attention is focused, linear, and appropriate.  NEURO: CN grossly intact. Oriented as arrived to appointment on time with no prompting. Moves both UE equally.  SKIN: No obvious lesions, wounds, erythema, or cyanosis noted on face or hands.   Assessment and Plan:    ICD-10-CM   1. Vaginal irritation  N89.8        Follow Up Instructions: Patient reports her symptoms are consistent with previous vaginal yeast infections.  Treating with Diflucan.  Instructed patient to come in to be seen in person either here or with her PCP if her symptoms are not improving.  Patient agrees to this plan of care.      I discussed the assessment and treatment plan with the patient. The patient was provided an opportunity to ask questions and all were answered. The patient agreed with the plan and demonstrated an understanding of the instructions.   The patient was advised to call back or seek an in-person evaluation if the symptoms worsen or if the condition fails to improve as anticipated.      Sharion Balloon, NP  06/06/2019 11:23 AM          Sharion Balloon, NP 06/06/19 [id number]

## 2019-06-07 DIAGNOSIS — R9431 Abnormal electrocardiogram [ECG] [EKG]: Secondary | ICD-10-CM | POA: Insufficient documentation

## 2019-06-07 DIAGNOSIS — Z09 Encounter for follow-up examination after completed treatment for conditions other than malignant neoplasm: Secondary | ICD-10-CM | POA: Insufficient documentation

## 2019-06-07 DIAGNOSIS — U071 COVID-19: Secondary | ICD-10-CM | POA: Insufficient documentation

## 2019-06-07 DIAGNOSIS — J1282 Pneumonia due to coronavirus disease 2019: Secondary | ICD-10-CM | POA: Insufficient documentation

## 2019-06-07 NOTE — Assessment & Plan Note (Signed)
Patient is stable post discharge from [facility] on Dec 27.  Her hyperglycemia was addressed with the addition of mealtime insulin . She has no other new issues or questions about her discharge plans.

## 2019-06-07 NOTE — Assessment & Plan Note (Signed)
Secondary to anaphylaxis : S1Q3T3 pattern noted during ER visit and again during admission

## 2019-06-07 NOTE — Assessment & Plan Note (Addendum)
Diagnosed Dec 16.  Anaphylaxis  to antibody infusion Dec 18, required epinephrine and steroids.  Admitted with hypoxic respiratory failure, bilateral pulmonary infiltrates,  Pleurisy, on  dec 24.  Hypoxia resolved and she was discharged on Dec 27 on steroid taper .

## 2019-06-07 NOTE — Assessment & Plan Note (Signed)
Continue use of albuterol MDI, cough suppressants for bilateral pulmonary infiltrates noted on admission chest  Ray .  Repeat chest x ray will be needed in 4-6 weeks

## 2019-06-07 NOTE — Assessment & Plan Note (Signed)
She had an anaphylactic reaction to the COVID antibody infusion and required epinpehrine and Decadron

## 2019-06-08 ENCOUNTER — Other Ambulatory Visit: Payer: Self-pay

## 2019-06-08 ENCOUNTER — Emergency Department: Payer: 59

## 2019-06-08 ENCOUNTER — Emergency Department
Admission: EM | Admit: 2019-06-08 | Discharge: 2019-06-08 | Disposition: A | Discharge status: Home / Self Care | Payer: 59 | Attending: Emergency Medicine | Admitting: Emergency Medicine

## 2019-06-08 DIAGNOSIS — U071 COVID-19: Secondary | ICD-10-CM | POA: Diagnosis not present

## 2019-06-08 DIAGNOSIS — R079 Chest pain, unspecified: Secondary | ICD-10-CM | POA: Diagnosis not present

## 2019-06-08 DIAGNOSIS — Z79899 Other long term (current) drug therapy: Secondary | ICD-10-CM | POA: Insufficient documentation

## 2019-06-08 DIAGNOSIS — I10 Essential (primary) hypertension: Secondary | ICD-10-CM | POA: Diagnosis not present

## 2019-06-08 DIAGNOSIS — Z91040 Latex allergy status: Secondary | ICD-10-CM | POA: Diagnosis not present

## 2019-06-08 DIAGNOSIS — G4489 Other headache syndrome: Secondary | ICD-10-CM | POA: Diagnosis not present

## 2019-06-08 DIAGNOSIS — R0789 Other chest pain: Secondary | ICD-10-CM | POA: Diagnosis not present

## 2019-06-08 DIAGNOSIS — R519 Headache, unspecified: Secondary | ICD-10-CM | POA: Diagnosis not present

## 2019-06-08 DIAGNOSIS — R0602 Shortness of breath: Secondary | ICD-10-CM | POA: Insufficient documentation

## 2019-06-08 DIAGNOSIS — G43901 Migraine, unspecified, not intractable, with status migrainosus: Secondary | ICD-10-CM | POA: Insufficient documentation

## 2019-06-08 DIAGNOSIS — E119 Type 2 diabetes mellitus without complications: Secondary | ICD-10-CM | POA: Diagnosis not present

## 2019-06-08 DIAGNOSIS — Z7982 Long term (current) use of aspirin: Secondary | ICD-10-CM | POA: Diagnosis not present

## 2019-06-08 DIAGNOSIS — G43909 Migraine, unspecified, not intractable, without status migrainosus: Secondary | ICD-10-CM | POA: Diagnosis not present

## 2019-06-08 DIAGNOSIS — J45909 Unspecified asthma, uncomplicated: Secondary | ICD-10-CM | POA: Insufficient documentation

## 2019-06-08 DIAGNOSIS — E1165 Type 2 diabetes mellitus with hyperglycemia: Secondary | ICD-10-CM | POA: Diagnosis not present

## 2019-06-08 LAB — BASIC METABOLIC PANEL
Anion gap: 13 (ref 5–15)
BUN: 26 mg/dL — ABNORMAL HIGH (ref 6–20)
CO2: 21 mmol/L — ABNORMAL LOW (ref 22–32)
Calcium: 10.1 mg/dL (ref 8.9–10.3)
Chloride: 100 mmol/L (ref 98–111)
Creatinine, Ser: 0.73 mg/dL (ref 0.44–1.00)
GFR calc Af Amer: >60 mL/min (ref >60)
GFR calc non Af Amer: >60 mL/min (ref >60)
Glucose, Bld: 333 mg/dL — ABNORMAL HIGH (ref 70–99)
Potassium: 4.4 mmol/L (ref 3.5–5.1)
Sodium: 134 mmol/L — ABNORMAL LOW (ref 135–145)

## 2019-06-08 LAB — CBC WITH DIFFERENTIAL/PLATELET
Abs Immature Granulocytes: 0.28 10*3/uL — ABNORMAL HIGH (ref 0.00–0.07)
Basophils Absolute: 0 10*3/uL (ref 0.0–0.1)
Basophils Relative: 0 %
Eosinophils Absolute: 0 10*3/uL (ref 0.0–0.5)
Eosinophils Relative: 0 %
HCT: 38.8 % (ref 36.0–46.0)
Hemoglobin: 13.6 g/dL (ref 12.0–15.0)
Immature Granulocytes: 2 %
Lymphocytes Relative: 16 %
Lymphs Abs: 2.6 10*3/uL (ref 0.7–4.0)
MCH: 28.9 pg (ref 26.0–34.0)
MCHC: 35.1 g/dL (ref 30.0–36.0)
MCV: 82.6 fL (ref 80.0–100.0)
Monocytes Absolute: 0.6 10*3/uL (ref 0.1–1.0)
Monocytes Relative: 4 %
Neutro Abs: 13.3 10*3/uL — ABNORMAL HIGH (ref 1.7–7.7)
Neutrophils Relative %: 78 %
Platelets: 297 10*3/uL (ref 150–400)
RBC: 4.7 MIL/uL (ref 3.87–5.11)
RDW: 12.2 % (ref 11.5–15.5)
WBC: 16.9 10*3/uL — ABNORMAL HIGH (ref 4.0–10.5)
nRBC: 0 % (ref 0.0–0.2)

## 2019-06-08 LAB — TROPONIN I (HIGH SENSITIVITY): Troponin I (High Sensitivity): <2 ng/L (ref <18)

## 2019-06-08 MED ORDER — DROPERIDOL 2.5 MG/ML IJ SOLN
2.5000 mg | Freq: Once | INTRAMUSCULAR | Status: AC
  Administered 2019-06-08: 03:00:00 2.5 mg via INTRAVENOUS
  Filled 2019-06-08: qty 2

## 2019-06-08 MED ORDER — SODIUM CHLORIDE 0.9 % IV BOLUS
1000.0000 mL | Freq: Once | INTRAVENOUS | Status: AC
  Administered 2019-06-08: 03:00:00 1000 mL via INTRAVENOUS

## 2019-06-08 MED ORDER — KETOROLAC TROMETHAMINE 30 MG/ML IJ SOLN
15.0000 mg | Freq: Once | INTRAMUSCULAR | Status: AC
  Administered 2019-06-08: 03:00:00 15 mg via INTRAVENOUS
  Filled 2019-06-08: qty 1

## 2019-06-08 MED ORDER — MAGNESIUM SULFATE 2 GM/50ML IV SOLN
2.0000 g | Freq: Once | INTRAVENOUS | Status: AC
  Administered 2019-06-08: 2 g via INTRAVENOUS
  Filled 2019-06-08: qty 50

## 2019-06-08 MED ORDER — BUTALBITAL-APAP-CAFFEINE 50-325-40 MG PO TABS
2.0000 | ORAL_TABLET | Freq: Once | ORAL | Status: AC
  Administered 2019-06-08: 04:00:00 2 via ORAL
  Filled 2019-06-08: qty 2

## 2019-06-08 MED ORDER — KETOROLAC TROMETHAMINE 30 MG/ML IJ SOLN
15.0000 mg | Freq: Once | INTRAMUSCULAR | Status: AC
  Administered 2019-06-08: 04:00:00 15 mg via INTRAVENOUS
  Filled 2019-06-08: qty 1

## 2019-06-08 MED ORDER — METOCLOPRAMIDE HCL 5 MG/ML IJ SOLN
10.0000 mg | Freq: Once | INTRAMUSCULAR | Status: AC
  Administered 2019-06-08: 04:00:00 10 mg via INTRAVENOUS
  Filled 2019-06-08: qty 2

## 2019-06-08 MED ORDER — TETRACAINE HCL 0.5 % OP SOLN
1.0000 [drp] | Freq: Once | OPHTHALMIC | Status: DC
  Filled 2019-06-08: qty 4

## 2019-06-08 NOTE — ED Provider Notes (Signed)
[facility] Emergency Department Provider Note  ____________________________________________  Time seen: Approximately 4:15 AM  I have reviewed the triage vital signs and the nursing notes.   HISTORY  Chief Complaint Headache and Shortness of Breath   HPI [name] is a 49 y.o. female with a history of migraines, anemia, asthma, anxiety, diabetes, hypertension, hyperlipidemia, COVID infection requiring 2 hospital admission since 05/23/2019 who presents for evaluation of HA. Patient reports that she has very frequent migraine headaches.  This 1 started earlier today around 3 PM when she woke up from a nap with.  She reports that the headache is severe, frontal, associated with severe photophobia and nausea.  Patient reports that she has been extremely fatigued since being diagnosed with COVID-19 therefore she was too weak to take her migraine medication.  She denies neck stiffness or fever.  She reports that the photophobia is much more severe than her normal migraine headaches.  She also reports that she has been on a prolonged steroid course of for her COVID-19 and her sugars have been high over the last several weeks.  Patient denies chest pain or shortness of breath, she denies fever, she denies sore throat.  Past Medical History:  Diagnosis Date  . Allergy   . Anemia   . Anxiety    claustrophobic  . Asthma   . Back pain   . Biceps tendonosis of right shoulder   . Diabetes mellitus 2011   did not start metforfin, losing weight  . Dyspnea   . Fatty liver   . Food allergy   . Headache disorder   . History of concussion   . HLD (hyperlipidemia)   . Hypertension   . IBS (irritable bowel syndrome)   . Infertility, female   . Joint pain   . Lactose intolerance   . Leg edema   . Migraines   . Other specified disorders of thyroid   . Palpitation   . Post-menopausal   . Seborrheic dermatitis   . Shoulder impingement syndrome, right   . Vitamin  D deficiency     Patient Active Problem List   Diagnosis Date Noted  . Abnormal electrocardiogram (ECG) (EKG) 06/07/2019  . Hospital discharge follow-up 06/07/2019  . Pneumonia due to COVID-19 virus 06/07/2019  . COVID-19 05/30/2019  . Hypoxia   . Other fatigue 01/23/2018  . Shortness of breath on exertion 01/23/2018  . Multinodular goiter 12/05/2017  . Multiple thyroid nodules 12/05/2017  . Microalbuminuria due to type 2 diabetes mellitus ([place]) 11/26/2017  . Pharyngitis 11/26/2017  . Encounter for preventive health examination 11/26/2017  . Abnormal TSH 11/26/2017  . Somnolence, daytime 11/14/2017  . Menopause syndrome 06/10/2017  . Adverse reaction to vaccine, sequela 03/25/2017  . Atypical chest pain 02/27/2017  . Palpitations 02/27/2017  . PAC (premature atrial contraction) 02/27/2017  . PVC's (premature ventricular contractions) 02/27/2017  . PSVT (paroxysmal supraventricular tachycardia) ([place]) 11/26/2016  . History of palpitations 11/08/2016  . Nontraumatic incomplete tear of right rotator cuff 07/14/2016  . AC (acromioclavicular) arthritis 07/14/2016  . Impingement syndrome of right shoulder 07/14/2016  . Fibrocystic breast changes, right 05/21/2016  . Menopausal symptoms 10/30/2015  . Insomnia 10/16/2015  . Snoring 07/26/2015  . Migraine without aura and without status migrainosus, not intractable 02/24/2015  . Muscle spasm 02/24/2015  . Facial paresthesia 02/24/2015  . History of concussion 07/21/2014  . Hyperlipidemia associated with type 2 diabetes mellitus ([place]) 06/13/2014  . Pain in joint, shoulder region 08/11/2013  .  Vitamin D deficiency 05/07/2013  . Chronic migraine 11/20/2012  . S/P Total Abdominal Hysterectomy and Left Salpingo-oophorectomy 10/20/2011  . Headache disorder   . Uncontrolled type 2 diabetes mellitus ([place]) 07/20/2011  . Obesity (BMI 30-39.9) 07/20/2011  . Essential hypertension 07/20/2011    Past Surgical History:  Procedure Laterality  Date  . ABDOMINAL HYSTERECTOMY  2006   heavy menses, endometriosis, l oophrectomy  . BACK SURGERY    . BREAST BIOPSY Right 2018   benign  . BREAST EXCISIONAL BIOPSY Right 2003   benign  . BREAST EXCISIONAL BIOPSY Right 1999   benign  . BREAST SURGERY     right breast x 2 , benign  . HERNIA REPAIR  2003   left inguinal   . LEFT OOPHORECTOMY    . SHOULDER ARTHROSCOPY WITH SUBACROMIAL DECOMPRESSION AND OPEN ROTATOR C Right 07/14/2016   Procedure: RIGHT SHOULDER ARTHROSCOPY WITH SUBACROMIAL DECOMPRESSION, DISTAL CLAVICLE RESECTION AND MINI OPEN ROTATOR CUFF REPAIR, OPEN BICEP TENDODESIS;  Surgeon: Garald Balding, MD;  Location: [facility];  Service: Orthopedics;  Laterality: Right;  . SHOULDER CLOSED REDUCTION Right 09/08/2016   Procedure: RIGHT CLOSED MANIPULATION SHOULDER;  Surgeon: Garald Balding, MD;  Location: [facility];  Service: Orthopedics;  Laterality: Right;  . TUBAL LIGATION      Prior to Admission medications   Medication Sig Start Date End Date Taking? Authorizing Provider  albuterol (VENTOLIN HFA) 108 (90 Base) MCG/ACT inhaler Inhale 2 puffs into the lungs every 6 (six) hours as needed for wheezing or shortness of breath. 05/23/19   Wurst, [place], PA-C  ALPRAZolam (XANAX) 0.5 MG tablet Take 1 tablet (0.5 mg total) by mouth 3 (three) times daily as needed (Headache). 06/02/19   Enzo Bi, MD  aspirin EC 81 MG tablet Take 81 mg by mouth daily.    [provider]  atorvastatin (LIPITOR) 20 MG tablet Take 1 tablet (20 mg total) by mouth daily at 6 PM. 06/02/19   Enzo Bi, MD  benzonatate (TESSALON) 100 MG capsule Take 1 capsule (100 mg total) by mouth 3 (three) times daily as needed for cough. Patient not taking: Reported on 06/05/2019 06/02/19   Enzo Bi, MD  butalbital-acetaminophen-caffeine (FIORICET) [phone] MG tablet Take 1 tablet by mouth every 6 (six) hours as needed for headache. Do not refill in less than 30 day 06/05/19    Crecencio Mc, MD  dexamethasone (DECADRON) 2 MG tablet Take 2 tablets (4 mg total) for 3 days from 12/28 to 12/31.  Then 1 tablet (2 mg total) for 4 days from 06/07/19 to 06/10/19.  This is your steroid taper down. 06/03/19   Enzo Bi, MD  diazepam (VALIUM) 2 MG tablet Take 2 mg by mouth every 6 (six) hours as needed (Headache).     [provider]  diltiazem (CARDIZEM LA) 240 MG 24 hr tablet Take 1 tablet (240 mg total) by mouth daily. 06/02/19   Enzo Bi, MD  EPINEPHrine 0.3 mg/0.3 mL IJ SOAJ injection Inject 0.3 mLs (0.3 mg total) into the muscle as needed (for allergic reaction). 05/24/19   Carmin Muskrat, MD  ergocalciferol (VITAMIN D2) 1.25 MG (50000 UT) capsule Take 1 capsule (50,000 Units total) by mouth once a week. 06/02/19   Enzo Bi, MD  fluconazole (DIFLUCAN) 150 MG tablet Take 1 tablet (150 mg total) by mouth daily. Take one tablet today.  May repeat in 3 days. 06/06/19   Sharion Balloon, NP  Fremanezumab-vfrm (AJOVY) 225 MG/1.5ML Darden Palmer  Inject 675 mg into the skin every 3 (three) months. 03/22/19   Teague Carlis Abbott, Collene Leyden, PA-C  hydrochlorothiazide (HYDRODIURIL) 12.5 MG tablet Take 12.5 mg by mouth daily.    [provider]  insulin degludec (TRESIBA FLEXTOUCH) 100 UNIT/ML SOPN FlexTouch Pen Inject 0.45 mLs (45 Units total) into the skin daily. Patient taking differently: Inject 75 Units into the skin daily.  06/04/19   Renato Shin, MD  insulin lispro (HUMALOG KWIKPEN) 100 UNIT/ML KwikPen Inject 0.05 mLs (5 Units total) into the skin 3 (three) times daily. 06/05/19   Crecencio Mc, MD  ketorolac (TORADOL) 10 MG tablet Take 1 tablet (10 mg total) by mouth every 6 (six) hours as needed. Patient taking differently: Take 10 mg by mouth every 6 (six) hours as needed (Headache).  02/16/16   Anyanwu, Sallyanne Havers, MD  losartan (COZAAR) 50 MG tablet Take 50 mg by mouth daily.    [provider]  metFORMIN (GLUCOPHAGE-XR) 500 MG 24 hr tablet Take 2 tablets (1,000 mg  total) by mouth 2 (two) times daily. 05/08/19   Renato Shin, MD  metoprolol succinate (TOPROL-XL) 25 MG 24 hr tablet Take 0.5 tablets (12.5 mg total) by mouth daily. 09/20/18   End, Harrell Gave, MD  ondansetron (ZOFRAN ODT) 4 MG disintegrating tablet Take 1 tablet (4 mg total) by mouth every 8 (eight) hours as needed for nausea or vomiting. 06/22/17   Carrie Mew, MD  phentermine 37.5 MG capsule Take 37.5 mg by mouth every morning.    [provider]    Allergies Botox [onabotulinumtoxina], Clindamycin/lincomycin, Kiwi extract, Maxalt [rizatriptan benzoate], Nitrous oxide, Triptans, Aspirin, Contrast media [iodinated diagnostic agents], Flagyl [metronidazole], Hydrocodone-acetaminophen, Latex, Mango flavor, Rizatriptan, Vicodin [hydrocodone-acetaminophen], Flu virus vaccine, Penicillins, and Tetracyclines & related  Family History  Problem Relation Age of Onset  . Diabetes Mother   . Heart disease Mother 31  . Hypertension Mother   . Hyperlipidemia Mother   . Heart failure Mother   . Stroke Mother   . Thyroid disease Mother   . Depression Mother   . Sleep apnea Mother   . Obesity Mother   . Cancer Father 5       Lung Cancer  . Heart disease Father   . Mental illness Sister        bipolar, substance abuse,  clean 2 yrs  . Hypertension Sister   . Cancer Paternal Grandmother 38       breast cancer  . Breast cancer Paternal Grandmother   . Diabetes Maternal Grandmother   . Hypertension Maternal Grandmother   . Diabetes Son     Social History Social History   Tobacco Use  . Smoking status: Never Smoker  . Smokeless tobacco: Never Used  Substance Use Topics  . Alcohol use: No  . Drug use: No    Review of Systems  Constitutional: Negative for fever. + fatigue Eyes: Negative for visual changes. ENT: Negative for sore throat. Neck: No neck pain  Cardiovascular: Negative for chest pain. Respiratory: Negative for shortness of breath. Gastrointestinal:  Negative for abdominal pain, vomiting or diarrhea. Genitourinary: Negative for dysuria. Musculoskeletal: Negative for back pain. Skin: Negative for rash. Neurological: Negative for weakness or numbness. + HA and photophobia Psych: No SI or HI  ____________________________________________   PHYSICAL EXAM:  VITAL SIGNS: ED Triage Vitals  Enc Vitals Group     BP 06/08/19 0107 (!) 133/93     Pulse Rate 06/08/19 0107 70     Resp 06/08/19 0107 18  Temp 06/08/19 0107 97.8 F (36.6 C)     Temp Source 06/08/19 0107 Oral     SpO2 06/08/19 0107 97 %     Weight 06/08/19 0104 216 lb (98 kg)     Height 06/08/19 0104 5\' 6"  (1.676 m)     Head Circumference --      Peak Flow --      Pain Score 06/08/19 0301 10     Pain Loc --      Pain Edu? --      Excl. in [place]? --     Constitutional: Alert and oriented, laying in bed covering her eyes due to severe photophobia.  HEENT:      Head: Normocephalic and atraumatic.         Eyes: Conjunctivae are normal. Sclera is non-icteric. PERRL, EOMI, IOP 19 on the R and 17 on the L      Mouth/Throat: Mucous membranes are moist.       Neck: Supple with no signs of meningismus. Cardiovascular: Regular rate and rhythm. No murmurs, gallops, or rubs. 2+ symmetrical distal pulses are present in all extremities. No JVD. Respiratory: Normal respiratory effort. Lungs are clear to auscultation bilaterally. No wheezes, crackles, or rhonchi.  Gastrointestinal: Soft, non tender, and non distended with positive bowel sounds. No rebound or guarding. Musculoskeletal: Nontender with normal range of motion in all extremities. No edema, cyanosis, or erythema of extremities. Neurologic: Normal speech and language. Face is symmetric. Moving all extremities. No gross focal neurologic deficits are appreciated. Skin: Skin is warm, dry and intact. No rash noted. Psychiatric: Mood and affect are normal. Speech and behavior are normal.   ____________________________________________   LABS (all labs ordered are listed, but only abnormal results are displayed)  Labs Reviewed  CBC WITH DIFFERENTIAL/PLATELET - Abnormal; Notable for the following components:      Result Value   WBC 16.9 (*)    Neutro Abs 13.3 (*)    Abs Immature Granulocytes 0.28 (*)    All other components within normal limits  BASIC METABOLIC PANEL - Abnormal; Notable for the following components:   Sodium 134 (*)    CO2 21 (*)    Glucose, Bld 333 (*)    BUN 26 (*)    All other components within normal limits  TROPONIN I (HIGH SENSITIVITY)   ____________________________________________  EKG  ED ECG REPORT I, Rudene Re, the attending physician, personally viewed and interpreted this ECG.  Normal sinus rhythm, rate of 73, normal intervals, normal axis, no ST elevations or depressions. ____________________________________________  RADIOLOGY  I have personally reviewed the images performed during this visit and I agree with the Radiologist's read.   Interpretation by Radiologist:  CT Head Wo Contrast  Result Date: 06/08/2019 CLINICAL DATA:  Initial evaluation for acute headache. EXAM: CT HEAD WITHOUT CONTRAST TECHNIQUE: Contiguous axial images were obtained from the base of the skull through the vertex without intravenous contrast. COMPARISON:  Prior CT from 06/22/2017. FINDINGS: Brain: Cerebral volume within normal limits for patient age. No evidence for acute intracranial hemorrhage. No findings to suggest acute large vessel territory infarct. No mass lesion, midline shift, or mass effect. Ventricles are normal in size without evidence for hydrocephalus. No extra-axial fluid collection identified. Vascular: No hyperdense vessel identified. Skull: Scalp soft tissues demonstrate no acute abnormality. Calvarium intact. Sinuses/Orbits: Globes and orbital soft tissues within normal limits. Visualized paranasal sinuses are clear. No mastoid  effusion. IMPRESSION: Normal head CT.  No acute intracranial abnormality identified. Electronically Signed  By: Jeannine Boga M.D.   On: 06/08/2019 02:59   DG Chest Portable 1 View  Result Date: 06/08/2019 CLINICAL DATA:  Shortness of breath. COVID positive 10 days ago. Ongoing chest tightness. EXAM: PORTABLE CHEST 1 VIEW COMPARISON:  Radiograph 05/30/2019 FINDINGS: Resolved left lung base opacity from prior exam. No new airspace disease. Upper normal heart size with unchanged mediastinal contours. No pleural fluid or pneumothorax. No acute osseous abnormalities. IMPRESSION: Resolved left lung base opacity since exam 9 days ago. No new abnormality. Electronically Signed   By: Keith Rake M.D.   On: 06/08/2019 03:00     ____________________________________________   PROCEDURES  Procedure(s) performed: None Procedures Critical Care performed:  None ____________________________________________   INITIAL IMPRESSION / ASSESSMENT AND PLAN / ED COURSE  49 y.o. female with a history of migraines, anemia, asthma, anxiety, diabetes, hypertension, hyperlipidemia, COVID infection requiring 2 hospital admission since 05/23/2019 who presents for evaluation of HA.  Patient looks uncomfortable due to the severe headache, laying in bed holding her eyes closed.  Unfortunately she will not allow me to examine her eyes.  I explained to her that is very important that I do an eye examination to make sure patient does not have an eye etiology for her pain such as acute angle-closure glaucoma.  Patient is unable to open her eyes at this time and just me touching her face she will push me away.  She is otherwise neurologically intact.  She has normal vital signs, clinically no signs of stroke or meningitis with no fever or meningeal signs.  Head CT showing no intracranial abnormalities.  Labs do show leukocytosis and hyperglycemia in the setting of a prolonged course of steroids but no other acute  abnormalities and no signs of DKA.  Differential diagnosis including migraine headache, severe headaches from Covid, dehydration, acute angle-closure glaucoma.  We will treat the pain and try to reassess patient.    _________________________ 5:30 AM on 06/08/2019 -----------------------------------------  Patient reports full resolution of her headache and photophobia.  Eye exam normal with normal intraocular pressure, normal reactive pupils and intact extraocular movements.  Patient be discharged home on her migraine medication to follow-up with her PCP.  Discussed my standard return precautions.    As part of my medical decision making, I reviewed the following data within the [facility] notes reviewed and incorporated, Labs reviewed , EKG interpreted , Old EKG reviewed, Old chart reviewed, Radiograph reviewed , Notes from prior ED visits and [place] Controlled Substance Database   Please note:  Patient was evaluated in Emergency Department today for the symptoms described in the history of present illness. Patient was evaluated in the context of the global COVID-19 pandemic, which necessitated consideration that the patient might be at risk for infection with the SARS-CoV-2 virus that causes COVID-19. Institutional protocols and algorithms that pertain to the evaluation of patients at risk for COVID-19 are in a state of rapid change based on information released by regulatory bodies including the CDC and federal and state organizations. These policies and algorithms were followed during the patient's care in the ED.  Some ED evaluations and interventions may be delayed as a result of limited staffing during the pandemic.   ____________________________________________   FINAL CLINICAL IMPRESSION(S) / ED DIAGNOSES   Final diagnoses:  Migraine with status migrainosus, not intractable, unspecified migraine type      NEW MEDICATIONS STARTED DURING THIS VISIT:  ED  Discharge Orders    None  Note:  This document was prepared using Dragon voice recognition software and may include unintentional dictation errors.    Alfred Levins, [place], MD 06/08/19 [phone]

## 2019-06-08 NOTE — ED Triage Notes (Signed)
Patient coming [facility] from home headache. Patient is COVID positive, test done 10 days ago. Patient reports ongoing chest tightness since dx. Patient reports light sensitivity with headache.   EMS vitals: 128/73, HR 68, RR 18, O2 -99% on RA, 97.7 temp, CBG 376.

## 2019-06-08 NOTE — ED Notes (Signed)
Patient's sister called requesting to speak to patient. Sister was informed that information could not be given over the phone due to privacy laws. Sister verbalized understanding. Sister informed that this RN would inform patient that her sister would like her to call her and update her on her status. Patient informed. Patient called sister.

## 2019-06-08 NOTE — ED Triage Notes (Addendum)
Patient reports severe headache, and short of breath.  Reports she is on high dose of steroids. And fatigue.  Patient reports she took humualog 30 untis.  70 units Tresiva (at 11:30 pm.)  Patient had recent [facility] admission.

## 2019-06-08 NOTE — ED Notes (Addendum)
Pt ambulated to toilet at this time. Pt in NAD. O2 sats remain above 97% while ambulating

## 2019-06-08 NOTE — ED Notes (Signed)
Spoke w sister on phone. Provided update. Number to reach is [phone]

## 2019-06-08 NOTE — ED Notes (Addendum)
Patient called out. This RN to subwait to speak to patient. Patient asking about current status. Patient informed of triage/wait process. Patient verbalized understanding.   Patient c/o the her headache is becoming more severe. Patient describes headache as "worst headache of my life". MD Karma Greaser informed. MD ordered CT scan. Patient updated on current plan of care.

## 2019-06-10 ENCOUNTER — Other Ambulatory Visit: Payer: Self-pay | Admitting: *Deleted

## 2019-06-10 NOTE — Patient Outreach (Signed)
[facility]) Care Management  06/10/2019  [name] 1971/01/08 [id number]  EMMI Red Alert  Day #4 Date: 06/07/19 Red Alert Reason : Lost interest in things : Yes   Outreach attempt #1 Incoming return call from patient, explained reason for the call and automated EMMI calls. Discussed recent call and question of Lost interest in things, she states that she has been sick and has not energy to do things, but she wants to be able to do things.  Patient known to Probation officer from outreach on last week after hospital discharge with diagnosis of covid 19.  Condition: [name] discussed current recovering from covid 19, and problems with elevated blood sugar due to being on steroid. She reports follow up telehealth visit with Dr.Tullo on last week . She reports being started on Humalog ( with instructions on increasing for goal of post prandial reading of 160 )  in addition to her [place]. She reports blood sugars in the am are in the 160 -170 fasting  range and throughout the day readings up to 300;s, before meals, blood sugar 343 before lunch states she took 34 of Humalog she hasn't checked reading after meals.  She discussed still having low energy, headaches , filling drunk and she doesn't drinks she suspects it is related to being on steroid and elevated blood sugar reading. She states that today was her last dose of steroid, and hopeful blood sugar will begin to improve. She continues to focus on adequate fluid intake, protein and modified carbohydrates. She reports sending Dr. Derrel Nip a mychart message regarding her blood sugars and recent ED visit.  She reviewed recent ED visit due to migraine  headache, photosensitivity, episode of decreased vision, she reports taking medication as needed for headache, reports improvement, reports beginning to feel headache and will take medication. She also has made an appointment with her eye doctor.   Patient has upcoming appointment with Dr.End  1/6 to follow up  with recent abnormal EKG.  She has follow up with Dr. Loanne Drilling on 06/17/19.   Patient denies other new concerns, still followed by [facility].  She is active with Active health management , chronic condition management .   Plan Will close case to [facility], [facility], [facility] care management, no care management needs to be addressed.  Patient was mailed successful outreach letter on 06/04/19.    Joylene Draft, RN, [place] Management Coordinator  [phone]- Mobile [phone]- Toll Free Main Office

## 2019-06-10 NOTE — Patient Outreach (Signed)
[facility]) Care Management  06/10/2019  [name] May 02, 1971 [id number]   EMMI Red Alert  Day #4 Date: 06/07/19 Red Alert Reason : Lost interest in things : Yes   Outreach attempt #1  Subjective: Unsuccessful outreach call to patient preferred contact number, able to leave a HIPAA compliant message requesting a  return call.     Plan Will await return call , if no response will plan return call in the next 4 business days, patient was sent via mail successful outreach letter on 06/04/19.  Joylene Draft, RN, [place] Management Coordinator  [phone]- Mobile [phone]- Toll Free Main Office

## 2019-06-12 ENCOUNTER — Encounter: Payer: Self-pay | Admitting: Internal Medicine

## 2019-06-12 ENCOUNTER — Other Ambulatory Visit: Payer: Self-pay

## 2019-06-12 ENCOUNTER — Ambulatory Visit (INDEPENDENT_AMBULATORY_CARE_PROVIDER_SITE_OTHER): Payer: 59 | Admitting: Internal Medicine

## 2019-06-12 ENCOUNTER — Other Ambulatory Visit: Payer: Self-pay | Admitting: Internal Medicine

## 2019-06-12 VITALS — BP 128/88 | HR 82 | Temp 97.9°F | Ht 66.0 in | Wt 230.0 lb

## 2019-06-12 DIAGNOSIS — I491 Atrial premature depolarization: Secondary | ICD-10-CM

## 2019-06-12 DIAGNOSIS — I10 Essential (primary) hypertension: Secondary | ICD-10-CM | POA: Diagnosis not present

## 2019-06-12 DIAGNOSIS — R0789 Other chest pain: Secondary | ICD-10-CM

## 2019-06-12 DIAGNOSIS — R601 Generalized edema: Secondary | ICD-10-CM

## 2019-06-12 DIAGNOSIS — R002 Palpitations: Secondary | ICD-10-CM

## 2019-06-12 DIAGNOSIS — H2000 Unspecified acute and subacute iridocyclitis: Secondary | ICD-10-CM | POA: Diagnosis not present

## 2019-06-12 DIAGNOSIS — R0602 Shortness of breath: Secondary | ICD-10-CM | POA: Diagnosis not present

## 2019-06-12 MED ORDER — ERGOCALCIFEROL 1.25 MG (50000 UT) PO CAPS: 50000.0000 [IU] | ORAL_CAPSULE | ORAL | 0 refills | Status: DC

## 2019-06-12 NOTE — Patient Instructions (Signed)
Medication Instructions:  Your physician recommends that you continue on your current medications as directed. Please refer to the Current Medication list given to you today.  *If you need a refill on your cardiac medications before your next appointment, please call your pharmacy*  Lab Work: None ordered  If you have labs (blood work) drawn today and your tests are completely normal, you will receive your results only by: [name] MyChart Message (if you have MyChart) OR . A paper copy in the mail If you have any lab test that is abnormal or we need to change your treatment, we will call you to review the results.  Testing/Procedures: 1- Your physician has requested that you have an echocardiogram. Echocardiography is a painless test that uses sound waves to create images of your heart. It provides your doctor with information about the size and shape of your heart and how well your heart's chambers and valves are working. This procedure takes approximately one hour. There are no restrictions for this procedure.  Preferred in 1 week!  Follow-Up: At [facility], you and your health needs are our priority.  As part of our continuing mission to provide you with exceptional heart care, we have created designated Provider Care Teams.  These Care Teams include your primary Cardiologist (physician) and Advanced Practice Providers (APPs -  Physician Assistants and Nurse Practitioners) who all work together to provide you with the care you need, when you need it.  Your next appointment:   3 month(s)  The format for your next appointment:   In Person  Provider:    You may see Nelva Bush, MD or one of the following Advanced Practice Providers on your designated Care Team:    Murray Hodgkins, NP  Christell Faith, PA-C  Marrianne Mood, PA-C

## 2019-06-12 NOTE — Telephone Encounter (Signed)
faxed

## 2019-06-12 NOTE — Progress Notes (Signed)
Follow-up Outpatient Visit Date: 06/12/2019  Primary Care Provider: Crecencio Mc, MD [address] [place] [address]  Chief Complaint: Shortness of breath and swelling  HPI:  [name] is a 49 y.o. female with history of PSVT, hypertension, diabetes mellitus, asthma, anxiety, and multiple allergies, who presents for follow-up of PSVT.  We last spoke in April, which time [name] was doing well from a heart standpoint.  She noted that she had been out of metoprolol few months earlier and experienced more frequent palpitations.  She also reported a single episode of transient chest discomfort during a particularly stressful episode.  [name] had been doing well without further chest pain or other symptoms until mid December, when she contracted COVID-19.  She did not require hospitalization but received outpatient antibody treatment.  She suffered an anaphylactic reaction that required epinephrine treatment and further antibiody therapy has been deferred.  She was placed on dexamethasone, having completed the course a few days ago.  She notes having had chest tightness and shortness of breath with COVID.  The discomfort has improved, though she continues to have some dyspnea and marked fatigue, as well as generalized swelling.  Palpitations have been much more frequent since she was started on dexamethasone.  She has put on 16 pounds in the last week.  She also reports frequent headaches since contracting COVID.  --------------------------------------------------------------------------------------------------  Cardiovascular History & Procedures: Cardiovascular Problems:  Palpitations  Chest pain and shortness of breath in the setting of COVID-19  Risk Factors:  Hypertension, diabetes,obesity,and family history  Cath/PCI:  None  CV Surgery:  None  EP Procedures and Devices:  14-day event monitor (01/12/17): Predominantly sinus rhythm with rare  PACs and PVCs. Brief atrial run lasting 4 beats.  Non-Invasive Evaluation(s):  Coronary CTA (03/23/17): No evidence of coronary artery disease. Normal coronary origins with right dominance. Coronary artery calcium score is zero.  TTE (01/19/17): Normal LV size with moderate LVH. LVEF 60-65% with grade 1 diastolic dysfunction. Normal RV size and function. Trivial pericardial effusion.  Recent CV Pertinent Labs: Lab Results  Component Value Date   CHOL 136 04/23/2018   HDL 39 (L) 04/23/2018   LDLCALC 83 04/23/2018   LDLDIRECT 98.0 08/17/2016   TRIG 71 04/23/2018   CHOLHDL 3.5 04/23/2018   CHOLHDL 4 03/22/2017   K 4.4 06/08/2019   K 3.6 06/14/2012   MG 1.9 06/02/2019   BUN 26 (H) 06/08/2019   BUN 12 04/23/2018   BUN 17 06/14/2012   CREATININE 0.73 06/08/2019   CREATININE 0.83 06/14/2013    Past medical and surgical history were reviewed and updated in EPIC.  Current Meds  Medication Sig  . albuterol (VENTOLIN HFA) 108 (90 Base) MCG/ACT inhaler Inhale 2 puffs into the lungs every 6 (six) hours as needed for wheezing or shortness of breath.  . ALPRAZolam (XANAX) 0.5 MG tablet Take 1 tablet (0.5 mg total) by mouth 3 (three) times daily as needed (Headache).  [name] aspirin EC 81 MG tablet Take 81 mg by mouth daily.  [name] atorvastatin (LIPITOR) 20 MG tablet Take 1 tablet (20 mg total) by mouth daily at 6 PM.  . benzonatate (TESSALON) 100 MG capsule Take 1 capsule (100 mg total) by mouth 3 (three) times daily as needed for cough.  . butalbital-acetaminophen-caffeine (FIORICET) 50-325-40 MG tablet Take 1 tablet by mouth every 6 (six) hours as needed for headache. Do not refill in less than 30 day  . dexamethasone (DECADRON) 2 MG tablet  Take 2 tablets (4 mg total) for 3 days from 12/28 to 12/31.  Then 1 tablet (2 mg total) for 4 days from 06/07/19 to 06/10/19.  This is your steroid taper down.  . diazepam (VALIUM) 2 MG tablet Take 2 mg by mouth every 6 (six) hours as needed (Headache).   .  diltiazem (CARDIZEM LA) 240 MG 24 hr tablet Take 1 tablet (240 mg total) by mouth daily.  . fluconazole (DIFLUCAN) 150 MG tablet Take 1 tablet (150 mg total) by mouth daily. Take one tablet today.  May repeat in 3 days.  . Fremanezumab-vfrm (AJOVY) 225 MG/1.5ML SOAJ Inject 675 mg into the skin every 3 (three) months.  . hydrochlorothiazide (HYDRODIURIL) 12.5 MG tablet Take 12.5 mg by mouth daily.  . insulin degludec (TRESIBA FLEXTOUCH) 100 UNIT/ML SOPN FlexTouch Pen Inject 0.45 mLs (45 Units total) into the skin daily. (Patient taking differently: Inject 75 Units into the skin daily. )  . insulin lispro (HUMALOG KWIKPEN) 100 UNIT/ML KwikPen Inject 0.05 mLs (5 Units total) into the skin 3 (three) times daily.  [name] ketorolac (TORADOL) 10 MG tablet Take 1 tablet (10 mg total) by mouth every 6 (six) hours as needed. (Patient taking differently: Take 10 mg by mouth every 6 (six) hours as needed (Headache). )  . metFORMIN (GLUCOPHAGE-XR) 500 MG 24 hr tablet Take 2 tablets (1,000 mg total) by mouth 2 (two) times daily.  . metoprolol succinate (TOPROL-XL) 25 MG 24 hr tablet Take 0.5 tablets (12.5 mg total) by mouth daily.  . ondansetron (ZOFRAN ODT) 4 MG disintegrating tablet Take 1 tablet (4 mg total) by mouth every 8 (eight) hours as needed for nausea or vomiting.  . phentermine 37.5 MG capsule Take 37.5 mg by mouth every morning.  . [DISCONTINUED] losartan (COZAAR) 50 MG tablet Take 50 mg by mouth daily.    Allergies: Bamlanivimab, Botox [onabotulinumtoxina], Clindamycin/lincomycin, Kiwi extract, Maxalt [rizatriptan benzoate], Nitrous oxide, Triptans, Aspirin, Contrast media [iodinated diagnostic agents], Flagyl [metronidazole], Hydrocodone-acetaminophen, Latex, Mango flavor, Rizatriptan, Vicodin [hydrocodone-acetaminophen], Flu virus vaccine, Penicillins, and Tetracyclines & related  Social History   Tobacco Use  . Smoking status: Never Smoker  . Smokeless tobacco: Never Used  Substance Use Topics  .  Alcohol use: No  . Drug use: No    Family History  Problem Relation Age of Onset  . Diabetes Mother   . Heart disease Mother 52  . Hypertension Mother   . Hyperlipidemia Mother   . Heart failure Mother   . Stroke Mother   . Thyroid disease Mother   . Depression Mother   . Sleep apnea Mother   . Obesity Mother   . Cancer Father 73       Lung Cancer  . Heart disease Father   . Mental illness Sister        bipolar, substance abuse,  clean 2 yrs  . Hypertension Sister   . Cancer Paternal Grandmother 58       breast cancer  . Breast cancer Paternal Grandmother   . Diabetes Maternal Grandmother   . Hypertension Maternal Grandmother   . Diabetes Son     Review of Systems: A 12-system review of systems was performed and was negative except as noted in the HPI.  --------------------------------------------------------------------------------------------------  Physical Exam: BP 128/88 (BP Location: Left Arm, Patient Position: Sitting, Cuff Size: Normal)   Pulse 82   Temp 97.9 F (36.6 C)   Ht 5\' 6"  (1.676 m)   Wt 230 lb (104.3 kg)  SpO2 98%   BMI 37.12 kg/m   General:  NAD. HEENT: No conjunctival pallor or scleral icterus. Facemask in place. Neck: Supple without lymphadenopathy, thyromegaly, JVD, or HJR. Lungs: Normal work of breathing. Clear to auscultation bilaterally without wheezes or crackles. Heart: Regular rate and rhythm without murmurs, rubs, or gallops. Non-displaced PMI. Abd: Bowel sounds present. Soft, NT/ND without hepatosplenomegaly Ext: Trace pretibial edema. Radial, PT, and DP pulses are 2+ bilaterally. Skin: Warm and dry without rash.  EKG:  NSR with PAC's.  Otherwise no significant abnormality.  Lab Results  Component Value Date   WBC 16.9 (H) 06/08/2019   HGB 13.6 06/08/2019   HCT 38.8 06/08/2019   MCV 82.6 06/08/2019   PLT 297 06/08/2019    Lab Results  Component Value Date   NA 134 (L) 06/08/2019   K 4.4 06/08/2019   CL 100 06/08/2019    CO2 21 (L) 06/08/2019   BUN 26 (H) 06/08/2019   CREATININE 0.73 06/08/2019   GLUCOSE 333 (H) 06/08/2019   ALT 23 06/02/2019    Lab Results  Component Value Date   CHOL 136 04/23/2018   HDL 39 (L) 04/23/2018   LDLCALC 83 04/23/2018   LDLDIRECT 98.0 08/17/2016   TRIG 71 04/23/2018   CHOLHDL 3.5 04/23/2018    --------------------------------------------------------------------------------------------------  ASSESSMENT AND PLAN: Chest pain, shortness of breath, and edema: I suspect these symptoms are largely driven by recent [id number] infection and have been exacerbated by fluid retention related to dexamethasone treatment.  EKG today does not show ischemic changes.  Additionally, prior cardiac CTA was normal.  I have recommended that we obtain an echocardiogram to exclude evidence of cardiomyopathy.  I encouraged Ms. Martindelcampo to minimize her sodium intake.  Hopefully, edema, dyspnea, and increased weight will resolve now that she has completed her steroid course.  Palpitations and PAC's: I suspect recent infection and subsequent steroid treatment has accentuated PAC's.  We will continue to monitor and defer medication changes at this time.  Ms. Lowhorn should continue diltiazem 240 mg daily.  Hypertension: BP upper normal.  Continue current doses of HCTZ and diltiazem.  Follow-up: Return to clinic in 3 months.  Nelva Bush, MD 06/13/2019 10:16 PM

## 2019-06-13 ENCOUNTER — Other Ambulatory Visit: Payer: Self-pay

## 2019-06-13 ENCOUNTER — Ambulatory Visit: Payer: Self-pay | Admitting: *Deleted

## 2019-06-13 ENCOUNTER — Other Ambulatory Visit: Payer: Self-pay | Admitting: Internal Medicine

## 2019-06-13 ENCOUNTER — Encounter: Payer: Self-pay | Admitting: Internal Medicine

## 2019-06-13 DIAGNOSIS — R601 Generalized edema: Secondary | ICD-10-CM | POA: Insufficient documentation

## 2019-06-13 MED FILL — VIT D2 1.25 MG (50,000 UNIT: 1.25 MG | 84 days supply | Qty: 12 | Fill #0

## 2019-06-13 MED FILL — PHENTERMINE 37.5 MG TABLET: 37.5 | 30 days supply | Qty: 30 | Fill #0

## 2019-06-14 MED FILL — LOSARTAN POTASSIUM 50 MG TA: 50 | 90 days supply | Qty: 90 | Fill #0

## 2019-06-17 ENCOUNTER — Ambulatory Visit (INDEPENDENT_AMBULATORY_CARE_PROVIDER_SITE_OTHER): Payer: 59 | Admitting: Endocrinology

## 2019-06-17 ENCOUNTER — Encounter: Payer: Self-pay | Admitting: Endocrinology

## 2019-06-17 ENCOUNTER — Other Ambulatory Visit: Payer: Self-pay

## 2019-06-17 DIAGNOSIS — E1165 Type 2 diabetes mellitus with hyperglycemia: Secondary | ICD-10-CM | POA: Diagnosis not present

## 2019-06-17 MED ORDER — INSULIN LISPRO (1 UNIT DIAL) 100 UNIT/ML (KWIKPEN): 20.0000 [IU] | PEN_INJECTOR | Freq: Three times a day (TID) | SUBCUTANEOUS | 3 refills | Status: DC

## 2019-06-17 NOTE — Patient Instructions (Addendum)
Please continue the same trresiba for now, and:  Take humalog 20 units 3 times a day (just before each meal).   check your blood sugar twice a day.  vary the time of day when you check, between before the 3 meals, and at bedtime.  also check if you have symptoms of your blood sugar being too high or too low.  please keep a record of the readings and bring it to your next appointment here (or you can bring the meter itself).  You can write it on any piece of paper.  please call us sooner if your blood sugar goes below 70, or if you have a lot of readings over 200. Your insulin need will probably decrease soon, so please be on the lookout for low blood sugar--call if this happens. Please have a follow-up appointment next week.  Video is OK.

## 2019-06-17 NOTE — Progress Notes (Signed)
Subjective:    Patient ID: [name], female    DOB: November 08, 1970, 49 y.o.   MRN: [id number]  HPI telehealth visit today via doxy video visit.  Alternatives to telehealth are presented to this patient, and the patient agrees to the telehealth visit. Pt is advised of the cost of the visit, and agrees to this, also.   Patient is at home, and I am at the office.   Persons attending the telehealth visit: the patient and I Pt returns for f/u of diabetes mellitus: DM type: Insulin-requiring type 2 Dx'ed: 0000000 Complications:  Therapy: insulin since 2018 GDM: 1990 and 2004 DKA: never Severe hypoglycemia: never Pancreatitis: never Pancreatic imaging: normal on 2019 CT SDOH: none Other: She did not tolerate Jardiance (vaginitis) ot Victoza (dysphagia); she takes multiple daily injections. Interval history: She is recovering from coronavirus.  She has been off steroids x 1 week.  She takes tresiba 75/day, and humalog 8-40 units 3 times a day (just before each meal).  Over the past few days, cbg varies from 133-343.  It is in general higher as the day goes on.  Past Medical History:  Diagnosis Date  . Allergy   . Anemia   . Anxiety    claustrophobic  . Asthma   . Back pain   . Biceps tendonosis of right shoulder   . Diabetes mellitus 2011   did not start metforfin, losing weight  . Dyspnea   . Fatty liver   . Food allergy   . Headache disorder   . History of concussion   . HLD (hyperlipidemia)   . Hypertension   . IBS (irritable bowel syndrome)   . Infertility, female   . Joint pain   . Lactose intolerance   . Leg edema   . Migraines   . Other specified disorders of thyroid   . Palpitation   . Post-menopausal   . Seborrheic dermatitis   . Shoulder impingement syndrome, right   . Vitamin D deficiency     Past Surgical History:  Procedure Laterality Date  . ABDOMINAL HYSTERECTOMY  2006   heavy menses, endometriosis, l oophrectomy  . BACK SURGERY    . BREAST BIOPSY  Right 2018   benign  . BREAST EXCISIONAL BIOPSY Right 2003   benign  . BREAST EXCISIONAL BIOPSY Right 1999   benign  . BREAST SURGERY     right breast x 2 , benign  . HERNIA REPAIR  2003   left inguinal   . LEFT OOPHORECTOMY    . SHOULDER ARTHROSCOPY WITH SUBACROMIAL DECOMPRESSION AND OPEN ROTATOR C Right 07/14/2016   Procedure: RIGHT SHOULDER ARTHROSCOPY WITH SUBACROMIAL DECOMPRESSION, DISTAL CLAVICLE RESECTION AND MINI OPEN ROTATOR CUFF REPAIR, OPEN BICEP TENDODESIS;  Surgeon: Garald Balding, MD;  Location: [place];  Service: Orthopedics;  Laterality: Right;  . SHOULDER CLOSED REDUCTION Right 09/08/2016   Procedure: RIGHT CLOSED MANIPULATION SHOULDER;  Surgeon: Garald Balding, MD;  Location: [place];  Service: Orthopedics;  Laterality: Right;  . TUBAL LIGATION      Social History   Socioeconomic History  . Marital status: Divorced    Spouse name: Not on file  . Number of children: 2  . Years of education: 4  . Highest education level: Master's degree (e.g., MA, MS, MEng, MEd, MSW, MBA)  Occupational History  . 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 Smoking status: Never Smoker  . Smokeless  tobacco: Never Used  Substance and Sexual Activity  . Alcohol use: No  . Drug use: No  . Sexual activity: Not Currently    Partners: Male    Birth control/protection: Surgical  Other Topics Concern  . Not on file  Social History Narrative   Lives at home with son and daughter.   Right-handed.   1-3 cups caffeine weekly.   Social Determinants of Health   Financial Resource Strain:   . Difficulty of Paying Living Expenses: Not on file  Food Insecurity:   . Worried About Charity fundraiser in the Last Year: Not on file  . Ran Out of Food in the Last Year: Not on file  Transportation Needs:   . Lack of Transportation (Medical): Not on file  . Lack of Transportation (Non-Medical): Not on file  Physical Activity:    . Days of Exercise per Week: Not on file  . Minutes of Exercise per Session: Not on file  Stress:   . Feeling of Stress : Not on file  Social Connections:   . Frequency of Communication with Friends and Family: Not on file  . Frequency of Social Gatherings with Friends and Family: Not on file  . Attends Religious Services: Not on file  . Active Member of Clubs or Organizations: Not on file  . Attends Archivist Meetings: Not on file  . Marital Status: Not on file  Intimate Partner Violence:   . Fear of Current or Ex-Partner: Not on file  . Emotionally Abused: Not on file  . Physically Abused: Not on file  . Sexually Abused: Not on file    Current Outpatient Medications on File Prior to Visit  Medication Sig Dispense Refill  . albuterol (VENTOLIN HFA) 108 (90 Base) MCG/ACT inhaler Inhale 2 puffs into the lungs every 6 (six) hours as needed for wheezing or shortness of breath. 18 g 0  . ALPRAZolam (XANAX) 0.5 MG tablet Take 1 tablet (0.5 mg total) by mouth 3 (three) times daily as needed (Headache).    [name] aspirin EC 81 MG tablet Take 81 mg by mouth daily.    [name] atorvastatin (LIPITOR) 20 MG tablet Take 1 tablet (20 mg total) by mouth daily at 6 PM.    . benzonatate (TESSALON) 100 MG capsule Take 1 capsule (100 mg total) by mouth 3 (three) times daily as needed for cough. 21 capsule 0  . butalbital-acetaminophen-caffeine (FIORICET) 50-325-40 MG tablet Take 1 tablet by mouth every 6 (six) hours as needed for headache. Do not refill in less than 30 day 30 tablet 1  . diazepam (VALIUM) 2 MG tablet Take 2 mg by mouth every 6 (six) hours as needed (Headache).     . diltiazem (CARDIZEM LA) 240 MG 24 hr tablet Take 1 tablet (240 mg total) by mouth daily.    [name] EPINEPHrine 0.3 mg/0.3 mL IJ SOAJ injection Inject 0.3 mLs (0.3 mg total) into the muscle as needed (for allergic reaction). 1 each 3  . ergocalciferol (DRISDOL) 1.25 MG (50000 UT) capsule Take 1 capsule (50,000 Units total) by mouth  once a week. 12 capsule 0  . fluconazole (DIFLUCAN) 150 MG tablet Take 1 tablet (150 mg total) by mouth daily. Take one tablet today.  May repeat in 3 days. 2 tablet 0  . Fremanezumab-vfrm (AJOVY) 225 MG/1.5ML SOAJ Inject 675 mg into the skin every 3 (three) months. 3 pen 3  . hydrochlorothiazide (HYDRODIURIL) 12.5 MG tablet Take 12.5 mg by mouth daily.    [name]  insulin degludec (TRESIBA FLEXTOUCH) 100 UNIT/ML SOPN FlexTouch Pen Inject 0.45 mLs (45 Units total) into the skin daily. (Patient taking differently: Inject 75 Units into the skin daily. ) 41 mL 2  . ketorolac (TORADOL) 10 MG tablet Take 1 tablet (10 mg total) by mouth every 6 (six) hours as needed. (Patient taking differently: Take 10 mg by mouth every 6 (six) hours as needed (Headache). ) 30 tablet 1  . losartan (COZAAR) 50 MG tablet TAKE 1 TABLET BY MOUTH DAILY. 90 tablet 0  . metFORMIN (GLUCOPHAGE-XR) 500 MG 24 hr tablet Take 2 tablets (1,000 mg total) by mouth 2 (two) times daily. 360 tablet 2  . metoprolol succinate (TOPROL-XL) 25 MG 24 hr tablet Take 0.5 tablets (12.5 mg total) by mouth daily. 45 tablet 3  . ondansetron (ZOFRAN ODT) 4 MG disintegrating tablet Take 1 tablet (4 mg total) by mouth every 8 (eight) hours as needed for nausea or vomiting. 20 tablet 0  . phentermine 37.5 MG capsule Take 37.5 mg by mouth every morning.     No current facility-administered medications on file prior to visit.    Allergies  Allergen Reactions  . Bamlanivimab Anaphylaxis, Itching, Palpitations, Rash, Shortness Of Breath and Swelling  . Botox [Onabotulinumtoxina] Shortness Of Breath    syncope  . Clindamycin/Lincomycin Other (See Comments)  . Kiwi Extract Shortness Of Breath  . Maxalt [Rizatriptan Benzoate] Anaphylaxis    Chest pain  . Nitrous Oxide Shortness Of Breath    syncope  . Triptans Shortness Of Breath  . Aspirin Nausea And Vomiting    Mouth blisters  . Contrast Media [Iodinated Diagnostic Agents]   . Flagyl [Metronidazole]   .  Hydrocodone-Acetaminophen Other (See Comments)  . Latex     Sometimes causes rash  . Mango Flavor   . Rizatriptan Other (See Comments)  . Vicodin [Hydrocodone-Acetaminophen]     hallucinations  . Flu Virus Vaccine Palpitations  . Penicillins Nausea And Vomiting, Rash and Other (See Comments)    Did it involve swelling of the face/tongue/throat, SOB, or low BP? No Did it involve sudden or severe rash/hives, skin peeling, or any reaction on the inside of your mouth or nose? No Did you need to seek medical attention at a hospital or doctor's office? Yes When did it last happen?patient was 49 years old If all above answers are "NO", may proceed with cephalosporin use.  . Tetracyclines & Related Nausea And Vomiting and Rash    Family History  Problem Relation Age of Onset  . Diabetes Mother   . Heart disease Mother 15  . Hypertension Mother   . Hyperlipidemia Mother   . Heart failure Mother   . Stroke Mother   . Thyroid disease Mother   . Depression Mother   . Sleep apnea Mother   . Obesity Mother   . Cancer Father 86       Lung Cancer  . Heart disease Father   . Mental illness Sister        bipolar, substance abuse,  clean 2 yrs  . Hypertension Sister   . Cancer Paternal Grandmother 31       breast cancer  . Breast cancer Paternal Grandmother   . Diabetes Maternal Grandmother   . Hypertension Maternal Grandmother   . Diabetes Son     There were no vitals taken for this visit.   Review of Systems She denies hypoglycemia.      Objective:   Physical Exam  Assessment & Plan:  Insulin-requiring type 2 DM: she needs increased rx.   Coronavirus, new to me.  Insulin requirement will decrease further off steroids  Patient Instructions  Please continue the same trresiba for now, and:  Take humalog 20 units 3 times a day (just before each meal).   check your blood sugar twice a day.  vary the time of day when you check, between before the 3 meals, and at  bedtime.  also check if you have symptoms of your blood sugar being too high or too low.  please keep a record of the readings and bring it to your next appointment here (or you can bring the meter itself).  You can write it on any piece of paper.  please call us sooner if your blood sugar goes below 70, or if you have a lot of readings over 200. Your insulin need will probably decrease soon, so please be on the lookout for low blood sugar--call if this happens. Please have a follow-up appointment next week.  Video is OK.

## 2019-06-18 ENCOUNTER — Other Ambulatory Visit: Payer: Self-pay

## 2019-06-18 ENCOUNTER — Ambulatory Visit (INDEPENDENT_AMBULATORY_CARE_PROVIDER_SITE_OTHER): Payer: 59

## 2019-06-18 DIAGNOSIS — R0602 Shortness of breath: Secondary | ICD-10-CM

## 2019-06-19 ENCOUNTER — Telehealth: Payer: Self-pay | Admitting: Nutrition

## 2019-06-19 NOTE — Telephone Encounter (Signed)
Per staff message, patient called asking for status of her [employer] pump request.   Telephoned her X2 no answer, not able to leave a voice mail. If she calls back, please tell her to call the [employer] rep. [phone].  His name is Gerald Stabs

## 2019-06-21 ENCOUNTER — Other Ambulatory Visit
Admission: RE | Admit: 2019-06-21 | Discharge: 2019-06-21 | Disposition: A | Discharge status: Home / Self Care | Payer: 59 | Source: Ambulatory Visit | Attending: Ophthalmology | Admitting: Ophthalmology

## 2019-06-21 DIAGNOSIS — H209 Unspecified iridocyclitis: Secondary | ICD-10-CM | POA: Insufficient documentation

## 2019-06-21 DIAGNOSIS — H2000 Unspecified acute and subacute iridocyclitis: Secondary | ICD-10-CM | POA: Diagnosis not present

## 2019-06-21 LAB — CBC WITH DIFFERENTIAL/PLATELET
Abs Immature Granulocytes: 0.01 10*3/uL (ref 0.00–0.07)
Basophils Absolute: 0 10*3/uL (ref 0.0–0.1)
Basophils Relative: 0 %
Eosinophils Absolute: 0.1 10*3/uL (ref 0.0–0.5)
Eosinophils Relative: 1 %
HCT: 40.5 % (ref 36.0–46.0)
Hemoglobin: 12.9 g/dL (ref 12.0–15.0)
Immature Granulocytes: 0 %
Lymphocytes Relative: 39 %
Lymphs Abs: 2.4 10*3/uL (ref 0.7–4.0)
MCH: 29 pg (ref 26.0–34.0)
MCHC: 31.9 g/dL (ref 30.0–36.0)
MCV: 91 fL (ref 80.0–100.0)
Monocytes Absolute: 0.3 10*3/uL (ref 0.1–1.0)
Monocytes Relative: 6 %
Neutro Abs: 3.4 10*3/uL (ref 1.7–7.7)
Neutrophils Relative %: 54 %
Platelets: 184 10*3/uL (ref 150–400)
RBC: 4.45 MIL/uL (ref 3.87–5.11)
RDW: 13.7 % (ref 11.5–15.5)
WBC: 6.2 10*3/uL (ref 4.0–10.5)
nRBC: 0 % (ref 0.0–0.2)

## 2019-06-21 LAB — SEDIMENTATION RATE: Sed Rate: 37 mm/hr — ABNORMAL HIGH (ref 0–20)

## 2019-06-21 NOTE — Telephone Encounter (Signed)
Spoke with her, and gave her the [employer] representative's number.

## 2019-06-22 LAB — ANA: Anti Nuclear Antibody (ANA): NEGATIVE

## 2019-06-22 LAB — ANGIOTENSIN CONVERTING ENZYME: Angiotensin-Converting Enzyme: 23 U/L (ref 14–82)

## 2019-06-23 LAB — QUANTIFERON-TB GOLD PLUS: QuantiFERON-TB Gold Plus: NEGATIVE

## 2019-06-23 LAB — QUANTIFERON-TB GOLD PLUS (RQFGPL)
QuantiFERON Mitogen Value: >10 IU/mL
QuantiFERON Nil Value: 0.08 IU/mL
QuantiFERON TB1 Ag Value: 0.16 IU/mL
QuantiFERON TB2 Ag Value: 0.13 IU/mL

## 2019-06-25 ENCOUNTER — Telehealth: Payer: Self-pay

## 2019-06-25 MED FILL — UNIFINE PENTIPS 32GX5/32: 32G X 4 MM | 25 days supply | Qty: 100 | Fill #0

## 2019-06-25 MED FILL — HUMALOG 100 UNITS/ML KWIKPE: 100 | 25 days supply | Qty: 15 | Fill #0

## 2019-06-25 MED FILL — UNIFINE PENTIPS 32GX5/32": 32G X 4 MM | 25 days supply | Qty: 100 | Fill #0

## 2019-06-25 NOTE — Telephone Encounter (Signed)
Company: [employer]  Document: CMN/Rx order for pump and supplies Other records requested: Office notes  All above requested information has been faxed successfully to [employer] listed above. Documents and fax confirmation have been placed in the faxed file for future reference.

## 2019-06-26 NOTE — Telephone Encounter (Signed)
The [employer] disabiltiy papers need to come directly from the doctors office please fax to [employer] . [employer] papers need to be faxed as well Please SEE ATTACHMENT ON PAPERWORK. [employer] is asking that you update the exiting paper work and fax it back to them not the patient.

## 2019-06-26 NOTE — Telephone Encounter (Signed)
I notified patient I have faxed paperwork for disability to [employer], and I have pulled [employer] paperwork from chart and I placed in red folder to you just need to the new return work dates and initialed by you.

## 2019-06-27 LAB — HLA-B27 ANTIGEN: HLA-B27: NEGATIVE

## 2019-06-28 ENCOUNTER — Other Ambulatory Visit: Payer: Self-pay | Admitting: Internal Medicine

## 2019-06-28 DIAGNOSIS — H2000 Unspecified acute and subacute iridocyclitis: Secondary | ICD-10-CM | POA: Diagnosis not present

## 2019-06-28 NOTE — Telephone Encounter (Signed)
[employer] paperwork received back from PCP and faxed to [employer] on 06/28/19,

## 2019-07-01 ENCOUNTER — Other Ambulatory Visit: Payer: Self-pay

## 2019-07-01 ENCOUNTER — Encounter: Payer: Self-pay | Admitting: Endocrinology

## 2019-07-01 ENCOUNTER — Ambulatory Visit (INDEPENDENT_AMBULATORY_CARE_PROVIDER_SITE_OTHER): Payer: 59 | Admitting: Endocrinology

## 2019-07-01 DIAGNOSIS — E1165 Type 2 diabetes mellitus with hyperglycemia: Secondary | ICD-10-CM | POA: Diagnosis not present

## 2019-07-01 MED ORDER — TRESIBA FLEXTOUCH 100 UNIT/ML ~~LOC~~ SOPN: 60.0000 [IU] | PEN_INJECTOR | Freq: Every day | SUBCUTANEOUS | 2 refills | Status: DC

## 2019-07-01 MED ORDER — INSULIN LISPRO (1 UNIT DIAL) 100 UNIT/ML (KWIKPEN): 25.0000 [IU] | PEN_INJECTOR | Freq: Three times a day (TID) | SUBCUTANEOUS | 3 refills | Status: DC

## 2019-07-01 NOTE — Telephone Encounter (Signed)
Historical medication. Refilled a year ago.

## 2019-07-01 NOTE — Patient Instructions (Addendum)
Please change the insulins to the numbers listed.  check your blood sugar twice a day.  vary the time of day when you check, between before the 3 meals, and at bedtime.  also check if you have symptoms of your blood sugar being too high or too low.  please keep a record of the readings and bring it to your next appointment here (or you can bring the meter itself).  You can write it on any piece of paper.  please call us sooner if your blood sugar goes below 70, or if you have a lot of readings over 200. Please come back for a follow-up appointment in 6 weeks.

## 2019-07-01 NOTE — Progress Notes (Signed)
Subjective:    Patient ID: [name], female    DOB: 06-25-1970, 49 y.o.   MRN: [id number]  HPI telehealth visit today via doxy video visit.  Alternatives to telehealth are presented to this patient, and the patient agrees to the telehealth visit. Pt is advised of the cost of the visit, and agrees to this, also.   Patient is at home, and I am at the office.   Persons attending the telehealth visit: the patient and I Pt returns for f/u of diabetes mellitus: DM type: Insulin-requiring type 2 Dx'ed: 0000000 Complications: DN Therapy: insulin since 2018 GDM: 1990 and 2004 DKA: never Severe hypoglycemia: never Pancreatitis: never Pancreatic imaging: normal on 2019 CT.  SDOH: none Other: She did not tolerate Jardiance (vaginitis) or Victoza (dysphagia); she takes multiple daily injections (but she takes humalog BID, as she eats 2 meals per day) Interval history: no further steroids.  Over the past few days, cbg varies from 90-196.  It is in general higher as the day goes on.   Past Medical History:  Diagnosis Date  . Allergy   . Anemia   . Anxiety    claustrophobic  . Asthma   . Back pain   . Biceps tendonosis of right shoulder   . Diabetes mellitus 2011   did not start metforfin, losing weight  . Dyspnea   . Fatty liver   . Food allergy   . Headache disorder   . History of concussion   . HLD (hyperlipidemia)   . Hypertension   . IBS (irritable bowel syndrome)   . Infertility, female   . Joint pain   . Lactose intolerance   . Leg edema   . Migraines   . Other specified disorders of thyroid   . Palpitation   . Post-menopausal   . Seborrheic dermatitis   . Shoulder impingement syndrome, right   . Vitamin D deficiency     Past Surgical History:  Procedure Laterality Date  . ABDOMINAL HYSTERECTOMY  2006   heavy menses, endometriosis, l oophrectomy  . BACK SURGERY    . BREAST BIOPSY Right 2018   benign  . BREAST EXCISIONAL BIOPSY Right 2003   benign  .  BREAST EXCISIONAL BIOPSY Right 1999   benign  . BREAST SURGERY     right breast x 2 , benign  . HERNIA REPAIR  2003   left inguinal   . LEFT OOPHORECTOMY    . SHOULDER ARTHROSCOPY WITH SUBACROMIAL DECOMPRESSION AND OPEN ROTATOR C Right 07/14/2016   Procedure: RIGHT SHOULDER ARTHROSCOPY WITH SUBACROMIAL DECOMPRESSION, DISTAL CLAVICLE RESECTION AND MINI OPEN ROTATOR CUFF REPAIR, OPEN BICEP TENDODESIS;  Surgeon: Garald Balding, MD;  Location: [facility];  Service: Orthopedics;  Laterality: Right;  . SHOULDER CLOSED REDUCTION Right 09/08/2016   Procedure: RIGHT CLOSED MANIPULATION SHOULDER;  Surgeon: Garald Balding, MD;  Location: [facility] [place];  Service: Orthopedics;  Laterality: Right;  . TUBAL LIGATION      Social History   Socioeconomic History  . Marital status: Divorced    Spouse name: Not on file  . Number of children: 2  . Years of education: 76  . Highest education level: Master's degree (e.g., MA, MS, MEng, MEd, MSW, MBA)  Occupational History  . 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 Smoking status: Never Smoker  . Smokeless tobacco: Never Used  Substance and Sexual Activity  . Alcohol use: No  .  Drug use: No  . Sexual activity: Not Currently    Partners: Male    Birth control/protection: Surgical  Other Topics Concern  . Not on file  Social History Narrative   Lives at home with son and daughter.   Right-handed.   1-3 cups caffeine weekly.   Social Determinants of Health   Financial Resource Strain:   . Difficulty of Paying Living Expenses: Not on file  Food Insecurity:   . Worried About Charity fundraiser in the Last Year: Not on file  . Ran Out of Food in the Last Year: Not on file  Transportation Needs:   . Lack of Transportation (Medical): Not on file  . Lack of Transportation (Non-Medical): Not on file  Physical Activity:   . Days of Exercise per Week: Not on file  . Minutes of Exercise per  Session: Not on file  Stress:   . Feeling of Stress : Not on file  Social Connections:   . Frequency of Communication with Friends and Family: Not on file  . Frequency of Social Gatherings with Friends and Family: Not on file  . Attends Religious Services: Not on file  . Active Member of Clubs or Organizations: Not on file  . Attends Archivist Meetings: Not on file  . Marital Status: Not on file  Intimate Partner Violence:   . Fear of Current or Ex-Partner: Not on file  . Emotionally Abused: Not on file  . Physically Abused: Not on file  . Sexually Abused: Not on file    Current Outpatient Medications on File Prior to Visit  Medication Sig Dispense Refill  . albuterol (VENTOLIN HFA) 108 (90 Base) MCG/ACT inhaler Inhale 2 puffs into the lungs every 6 (six) hours as needed for wheezing or shortness of breath. 18 g 0  . ALPRAZolam (XANAX) 0.5 MG tablet Take 1 tablet (0.5 mg total) by mouth 3 (three) times daily as needed (Headache).    [name] aspirin EC 81 MG tablet Take 81 mg by mouth daily.    [name] atorvastatin (LIPITOR) 20 MG tablet Take 1 tablet (20 mg total) by mouth daily at 6 PM.    . benzonatate (TESSALON) 100 MG capsule Take 1 capsule (100 mg total) by mouth 3 (three) times daily as needed for cough. 21 capsule 0  . butalbital-acetaminophen-caffeine (FIORICET) 50-325-40 MG tablet Take 1 tablet by mouth every 6 (six) hours as needed for headache. Do not refill in less than 30 day 30 tablet 1  . diazepam (VALIUM) 2 MG tablet Take 2 mg by mouth every 6 (six) hours as needed (Headache).     . diltiazem (CARDIZEM LA) 240 MG 24 hr tablet Take 1 tablet (240 mg total) by mouth daily.    [name] EPINEPHrine 0.3 mg/0.3 mL IJ SOAJ injection Inject 0.3 mLs (0.3 mg total) into the muscle as needed (for allergic reaction). 1 each 3  . ergocalciferol (DRISDOL) 1.25 MG (50000 UT) capsule Take 1 capsule (50,000 Units total) by mouth once a week. 12 capsule 0  . fluconazole (DIFLUCAN) 150 MG tablet  Take 1 tablet (150 mg total) by mouth daily. Take one tablet today.  May repeat in 3 days. 2 tablet 0  . Fremanezumab-vfrm (AJOVY) 225 MG/1.5ML SOAJ Inject 675 mg into the skin every 3 (three) months. 3 pen 3  . hydrochlorothiazide (HYDRODIURIL) 12.5 MG tablet Take 12.5 mg by mouth daily.    [name] ketorolac (TORADOL) 10 MG tablet Take 1 tablet (10 mg total) by  mouth every 6 (six) hours as needed. (Patient taking differently: Take 10 mg by mouth every 6 (six) hours as needed (Headache). ) 30 tablet 1  . losartan (COZAAR) 50 MG tablet TAKE 1 TABLET BY MOUTH DAILY. 90 tablet 0  . metFORMIN (GLUCOPHAGE-XR) 500 MG 24 hr tablet Take 2 tablets (1,000 mg total) by mouth 2 (two) times daily. 360 tablet 2  . metoprolol succinate (TOPROL-XL) 25 MG 24 hr tablet Take 0.5 tablets (12.5 mg total) by mouth daily. 45 tablet 3  . ondansetron (ZOFRAN ODT) 4 MG disintegrating tablet Take 1 tablet (4 mg total) by mouth every 8 (eight) hours as needed for nausea or vomiting. 20 tablet 0  . phentermine 37.5 MG capsule Take 37.5 mg by mouth every morning.     No current facility-administered medications on file prior to visit.    Allergies  Allergen Reactions  . Bamlanivimab Anaphylaxis, Itching, Palpitations, Rash, Shortness Of Breath and Swelling  . Botox [Onabotulinumtoxina] Shortness Of Breath    syncope  . Clindamycin/Lincomycin Other (See Comments)  . Kiwi Extract Shortness Of Breath  . Maxalt [Rizatriptan Benzoate] Anaphylaxis    Chest pain  . Nitrous Oxide Shortness Of Breath    syncope  . Triptans Shortness Of Breath  . Aspirin Nausea And Vomiting    Mouth blisters  . Contrast Media [Iodinated Diagnostic Agents]   . Flagyl [Metronidazole]   . Hydrocodone-Acetaminophen Other (See Comments)  . Latex     Sometimes causes rash  . Mango Flavor   . Rizatriptan Other (See Comments)  . Vicodin [Hydrocodone-Acetaminophen]     hallucinations  . Flu Virus Vaccine Palpitations  . Penicillins Nausea And  Vomiting, Rash and Other (See Comments)    Did it involve swelling of the face/tongue/throat, SOB, or low BP? No Did it involve sudden or severe rash/hives, skin peeling, or any reaction on the inside of your mouth or nose? No Did you need to seek medical attention at a hospital or doctor's office? Yes When did it last happen?patient was 49 years old If all above answers are "NO", may proceed with cephalosporin use.  . Tetracyclines & Related Nausea And Vomiting and Rash    Family History  Problem Relation Age of Onset  . Diabetes Mother   . Heart disease Mother 32  . Hypertension Mother   . Hyperlipidemia Mother   . Heart failure Mother   . Stroke Mother   . Thyroid disease Mother   . Depression Mother   . Sleep apnea Mother   . Obesity Mother   . Cancer Father 53       Lung Cancer  . Heart disease Father   . Mental illness Sister        bipolar, substance abuse,  clean 2 yrs  . Hypertension Sister   . Cancer Paternal Grandmother 44       breast cancer  . Breast cancer Paternal Grandmother   . Diabetes Maternal Grandmother   . Hypertension Maternal Grandmother   . Diabetes Son     There were no vitals taken for this visit.   Review of Systems She denies hypoglycemia.     Objective:   Physical Exam   Lab Results  Component Value Date   HGBA1C 8.5 (H) 05/30/2019       Assessment & Plan:  Insulin-requiring type 2 DM: Based on the pattern of her cbg's, she needs some adjustment in her therapy.   Patient Instructions  Please change the insulins to the  numbers listed.  check your blood sugar twice a day.  vary the time of day when you check, between before the 3 meals, and at bedtime.  also check if you have symptoms of your blood sugar being too high or too low.  please keep a record of the readings and bring it to your next appointment here (or you can bring the meter itself).  You can write it on any piece of paper.  please call us sooner if your blood sugar  goes below 70, or if you have a lot of readings over 200. Please come back for a follow-up appointment in 6 weeks.

## 2019-07-04 ENCOUNTER — Telehealth: Payer: Self-pay

## 2019-07-04 DIAGNOSIS — H2000 Unspecified acute and subacute iridocyclitis: Secondary | ICD-10-CM | POA: Diagnosis not present

## 2019-07-04 NOTE — Telephone Encounter (Signed)
[name] from [employer] requesting Letter of Medical necessity be sent and I informed him to fax request to us-he agreed-FYI

## 2019-07-04 NOTE — Telephone Encounter (Signed)
I believe I saw that you had been faxing info to [employer]. Please review request

## 2019-07-05 ENCOUNTER — Telehealth: Payer: Self-pay

## 2019-07-05 NOTE — Telephone Encounter (Signed)
APPROVAL  Received notification from [employer] that PA for [employer] insulin pump has been approved, maximum fill 07/04/19 through 08/03/19. Document has been labeled and placed in scan file for HIM and for our future reference.

## 2019-07-08 NOTE — Telephone Encounter (Signed)
Paperwork filled out and put on his desk for signature

## 2019-07-10 DIAGNOSIS — H2000 Unspecified acute and subacute iridocyclitis: Secondary | ICD-10-CM | POA: Diagnosis not present

## 2019-07-11 DIAGNOSIS — R5382 Chronic fatigue, unspecified: Secondary | ICD-10-CM | POA: Diagnosis not present

## 2019-07-11 MED FILL — VYVANSE 50 MG CAPSULE: 50 | 30 days supply | Qty: 30 | Fill #0

## 2019-07-12 ENCOUNTER — Encounter: Payer: Self-pay | Admitting: Endocrinology

## 2019-07-12 ENCOUNTER — Other Ambulatory Visit: Payer: Self-pay

## 2019-07-12 DIAGNOSIS — E1165 Type 2 diabetes mellitus with hyperglycemia: Secondary | ICD-10-CM

## 2019-07-12 MED ORDER — FREESTYLE LITE DEVI: 1.0000 | Freq: Two times a day (BID) | 0 refills | Status: DC

## 2019-07-12 MED ORDER — FREESTYLE LANCETS MISC: 1.0000 | Freq: Two times a day (BID) | 0 refills | Status: DC

## 2019-07-12 MED ORDER — FREESTYLE LITE TEST VI STRP: 1.0000 | ORAL_STRIP | Freq: Two times a day (BID) | 0 refills | Status: DC

## 2019-07-12 MED FILL — FREESTYLE LITE METER: 30 days supply | Qty: 1 | Fill #0

## 2019-07-12 MED FILL — FREESTYLE LITE TEST STRIP: 75 days supply | Qty: 150 | Fill #0

## 2019-07-17 DIAGNOSIS — H2000 Unspecified acute and subacute iridocyclitis: Secondary | ICD-10-CM | POA: Diagnosis not present

## 2019-07-23 ENCOUNTER — Telehealth: Payer: Self-pay | Admitting: Nutrition

## 2019-07-23 NOTE — Telephone Encounter (Signed)
Prescription faxed to [facility] for [employer] pump on 07/02/19

## 2019-07-24 ENCOUNTER — Other Ambulatory Visit: Payer: 59

## 2019-07-24 ENCOUNTER — Encounter: Payer: Self-pay | Admitting: Internal Medicine

## 2019-07-24 ENCOUNTER — Other Ambulatory Visit: Payer: Self-pay

## 2019-07-24 ENCOUNTER — Other Ambulatory Visit
Admission: RE | Admit: 2019-07-24 | Discharge: 2019-07-24 | Disposition: A | Discharge status: Home / Self Care | Payer: 59 | Source: Ambulatory Visit | Attending: Internal Medicine | Admitting: Internal Medicine

## 2019-07-24 ENCOUNTER — Ambulatory Visit (INDEPENDENT_AMBULATORY_CARE_PROVIDER_SITE_OTHER): Payer: 59 | Admitting: Internal Medicine

## 2019-07-24 VITALS — Ht 66.0 in | Wt 230.0 lb

## 2019-07-24 DIAGNOSIS — G933 Postviral fatigue syndrome: Secondary | ICD-10-CM | POA: Diagnosis not present

## 2019-07-24 DIAGNOSIS — R000 Tachycardia, unspecified: Secondary | ICD-10-CM

## 2019-07-24 DIAGNOSIS — G9331 Postviral fatigue syndrome: Secondary | ICD-10-CM

## 2019-07-24 DIAGNOSIS — R5383 Other fatigue: Secondary | ICD-10-CM

## 2019-07-24 DIAGNOSIS — R519 Headache, unspecified: Secondary | ICD-10-CM | POA: Diagnosis not present

## 2019-07-24 LAB — CBC WITH DIFFERENTIAL/PLATELET
Abs Immature Granulocytes: 0.02 10*3/uL (ref 0.00–0.07)
Basophils Absolute: 0 10*3/uL (ref 0.0–0.1)
Basophils Relative: 0 %
Eosinophils Absolute: 0.2 10*3/uL (ref 0.0–0.5)
Eosinophils Relative: 2 %
HCT: 37.7 % (ref 36.0–46.0)
Hemoglobin: 12.1 g/dL (ref 12.0–15.0)
Immature Granulocytes: 0 %
Lymphocytes Relative: 43 %
Lymphs Abs: 3.3 10*3/uL (ref 0.7–4.0)
MCH: 28.9 pg (ref 26.0–34.0)
MCHC: 32.1 g/dL (ref 30.0–36.0)
MCV: 90.2 fL (ref 80.0–100.0)
Monocytes Absolute: 0.5 10*3/uL (ref 0.1–1.0)
Monocytes Relative: 6 %
Neutro Abs: 3.7 10*3/uL (ref 1.7–7.7)
Neutrophils Relative %: 49 %
Platelets: 193 10*3/uL (ref 150–400)
RBC: 4.18 MIL/uL (ref 3.87–5.11)
RDW: 12.8 % (ref 11.5–15.5)
WBC: 7.8 10*3/uL (ref 4.0–10.5)
nRBC: 0 % (ref 0.0–0.2)

## 2019-07-24 LAB — SEDIMENTATION RATE: Sed Rate: 14 mm/hr (ref 0–20)

## 2019-07-24 MED FILL — FREESTYLE LANCETS: 90 days supply | Qty: 200 | Fill #0

## 2019-07-24 MED FILL — DILTIAZEM ER 240 MG TABLET: 240 | 90 days supply | Qty: 90 | Fill #1

## 2019-07-24 NOTE — Progress Notes (Signed)
Virtual Visit via [employer]  This visit type was conducted due to national recommendations for restrictions regarding the COVID-19 pandemic (e.g. social distancing).  This format is felt to be most appropriate for this patient at this time.  All issues noted in this document were discussed and addressed.  No physical exam was performed (except for noted visual exam findings with Video Visits).   I connected with@ on 07/24/19 at 10:00 AM EST by a video enabled telemedicine application  and verified that I am speaking with the correct person using two identifiers. Location patient: home Location provider: work or home office Persons participating in the virtual visit: patient, provider  I discussed the limitations, risks, security and privacy concerns of performing an evaluation and management service by telephone and the availability of in person appointments. I also discussed with the patient that there may be a patient responsible charge related to this service. The patient expressed understanding and agreed to proceed.   Reason for visit: post covid infection symptoms   HPI:  [name] is a 49 yr old female with poorly controled Type 2 DM who became infected with COVID 19 on Dec 16 requiring hospitalization.  Since discharge she has had a constellation of disabling symptoms including tachycardia,  recurrent bilateral temple headaches, excessive fatigue and vision changes including double vision.  Has had weekly wollow up  With  Dr George Ina,  Workup has not included a temporal artery biopsy despite elevated ESR on Jan 15.    She has not been able to return to work.   Lab Results  Component Value Date   HGBA1C 8.5 (H) 05/30/2019      ROS: See pertinent positives and negatives per HPI.  Past Medical History:  Diagnosis Date  . Allergy   . Anemia   . Anxiety    claustrophobic  . Asthma   . Back pain   . Biceps tendonosis of right shoulder   . Diabetes mellitus 2011   did not  start metforfin, losing weight  . Dyspnea   . Fatty liver   . Food allergy   . Headache disorder   . History of concussion   . HLD (hyperlipidemia)   . Hypertension   . IBS (irritable bowel syndrome)   . Infertility, female   . Joint pain   . Lactose intolerance   . Leg edema   . Migraines   . Other specified disorders of thyroid   . Palpitation   . Post-menopausal   . Seborrheic dermatitis   . Shoulder impingement syndrome, right   . Vitamin D deficiency     Past Surgical History:  Procedure Laterality Date  . ABDOMINAL HYSTERECTOMY  2006   heavy menses, endometriosis, l oophrectomy  . BACK SURGERY    . BREAST BIOPSY Right 2018   benign  . BREAST EXCISIONAL BIOPSY Right 2003   benign  . BREAST EXCISIONAL BIOPSY Right 1999   benign  . BREAST SURGERY     right breast x 2 , benign  . HERNIA REPAIR  2003   left inguinal   . LEFT OOPHORECTOMY    . SHOULDER ARTHROSCOPY WITH SUBACROMIAL DECOMPRESSION AND OPEN ROTATOR C Right 07/14/2016   Procedure: RIGHT SHOULDER ARTHROSCOPY WITH SUBACROMIAL DECOMPRESSION, DISTAL CLAVICLE RESECTION AND MINI OPEN ROTATOR CUFF REPAIR, OPEN BICEP TENDODESIS;  Surgeon: Garald Balding, MD;  Location: [place];  Service: Orthopedics;  Laterality: Right;  . SHOULDER CLOSED REDUCTION Right 09/08/2016   Procedure: RIGHT CLOSED MANIPULATION SHOULDER;  Surgeon: Garald Balding, MD;  Location: [place];  Service: Orthopedics;  Laterality: Right;  . TUBAL LIGATION      Family History  Problem Relation Age of Onset  . Diabetes Mother   . Heart disease Mother 16  . Hypertension Mother   . Hyperlipidemia Mother   . Heart failure Mother   . Stroke Mother   . Thyroid disease Mother   . Depression Mother   . Sleep apnea Mother   . Obesity Mother   . Cancer Father 84       Lung Cancer  . Heart disease Father   . Mental illness Sister        bipolar, substance abuse,  clean 2 yrs  . Hypertension Sister   . Cancer  Paternal Grandmother 56       breast cancer  . Breast cancer Paternal Grandmother   . Diabetes Maternal Grandmother   . Hypertension Maternal Grandmother   . Diabetes Son     SOCIAL HX:  reports that she has never smoked. She has never used smokeless tobacco. She reports that she does not drink alcohol or use drugs.   Current Outpatient Medications:  .  albuterol (VENTOLIN HFA) 108 (90 Base) MCG/ACT inhaler, Inhale 2 puffs into the lungs every 6 (six) hours as needed for wheezing or shortness of breath., Disp: 18 g, Rfl: 0 .  ALPRAZolam (XANAX) 0.5 MG tablet, Take 1 tablet (0.5 mg total) by mouth 3 (three) times daily as needed (Headache)., Disp: , Rfl:  .  aspirin EC 81 MG tablet, Take 81 mg by mouth daily., Disp: , Rfl:  .  atorvastatin (LIPITOR) 20 MG tablet, Take 1 tablet (20 mg total) by mouth daily at 6 PM., Disp: , Rfl:  .  benzonatate (TESSALON) 100 MG capsule, Take 1 capsule (100 mg total) by mouth 3 (three) times daily as needed for cough., Disp: 21 capsule, Rfl: 0 .  Blood Glucose Monitoring Suppl (FREESTYLE LITE) DEVI, 1 each by Does not apply route 2 (two) times daily. E11.9, Disp: 1 each, Rfl: 0 .  butalbital-acetaminophen-caffeine (FIORICET) 50-325-40 MG tablet, Take 1 tablet by mouth every 6 (six) hours as needed for headache. Do not refill in less than 30 day, Disp: 30 tablet, Rfl: 1 .  diazepam (VALIUM) 2 MG tablet, Take 2 mg by mouth every 6 (six) hours as needed (Headache). , Disp: , Rfl:  .  diltiazem (CARDIZEM LA) 240 MG 24 hr tablet, Take 1 tablet (240 mg total) by mouth daily., Disp: , Rfl:  .  EPINEPHrine 0.3 mg/0.3 mL IJ SOAJ injection, Inject 0.3 mLs (0.3 mg total) into the muscle as needed (for allergic reaction)., Disp: 1 each, Rfl: 3 .  ergocalciferol (DRISDOL) 1.25 MG (50000 UT) capsule, Take 1 capsule (50,000 Units total) by mouth once a week., Disp: 12 capsule, Rfl: 0 .  Fremanezumab-vfrm (AJOVY) 225 MG/1.5ML SOAJ, Inject 675 mg into the skin every 3 (three)  months., Disp: 3 pen, Rfl: 3 .  glucose blood (FREESTYLE LITE) test strip, 1 each by Other route 2 (two) times daily. E11.9, Disp: 200 each, Rfl: 0 .  hydrochlorothiazide (MICROZIDE) 12.5 MG capsule, TAKE 1 CAPSULE BY MOUTH DAILY., Disp: 90 capsule, Rfl: 0 .  insulin degludec (TRESIBA FLEXTOUCH) 100 UNIT/ML SOPN FlexTouch Pen, Inject 0.6 mLs (60 Units total) into the skin daily., Disp: 41 mL, Rfl: 2 .  insulin lispro (HUMALOG KWIKPEN) 100 UNIT/ML KwikPen, Inject 0.25 mLs (25 Units total) into the skin 3 (  three) times daily. And pen needles 4/day, Disp: 20 pen, Rfl: 3 .  ketorolac (TORADOL) 10 MG tablet, Take 1 tablet (10 mg total) by mouth every 6 (six) hours as needed. (Patient taking differently: Take 10 mg by mouth every 6 (six) hours as needed (Headache). ), Disp: 30 tablet, Rfl: 1 .  Lancets (FREESTYLE) lancets, 1 each by Other route 2 (two) times daily. E11.9, Disp: 200 each, Rfl: 0 .  losartan (COZAAR) 50 MG tablet, TAKE 1 TABLET BY MOUTH DAILY., Disp: 90 tablet, Rfl: 0 .  metFORMIN (GLUCOPHAGE-XR) 500 MG 24 hr tablet, Take 2 tablets (1,000 mg total) by mouth 2 (two) times daily., Disp: 360 tablet, Rfl: 2 .  metoprolol succinate (TOPROL-XL) 25 MG 24 hr tablet, Take 0.5 tablets (12.5 mg total) by mouth daily., Disp: 45 tablet, Rfl: 3 .  ondansetron (ZOFRAN ODT) 4 MG disintegrating tablet, Take 1 tablet (4 mg total) by mouth every 8 (eight) hours as needed for nausea or vomiting., Disp: 20 tablet, Rfl: 0 .  ISOPTO ATROPINE 1 % ophthalmic solution, , Disp: , Rfl:  .  prednisoLONE acetate (PRED FORTE) 1 % ophthalmic suspension, , Disp: , Rfl:  .  VYVANSE 50 MG capsule, , Disp: , Rfl:   EXAM:  VITALS per patient if applicable:  GENERAL: alert, oriented, appears well and in no acute distress  HEENT: atraumatic, conjunttiva clear, no obvious abnormalities on inspection of external nose and ears  NECK: normal movements of the head and neck  LUNGS: on inspection no signs of respiratory  distress, breathing rate appears normal, no obvious gross SOB, gasping or wheezing  CV: no obvious cyanosis  Stephanie: moves all visible extremities without noticeable abnormality  PSYCH/NEURO: pleasant and cooperative, no obvious depression or anxiety, speech and thought processing grossly intact  ASSESSMENT AND PLAN:  Discussed the following assessment and plan:  Headache around the eyes - Plan: CBC with Differential/Platelet, Sedimentation rate, CANCELED: Sedimentation rate, CANCELED: CBC with Differential/Platelet  Postviral fatigue syndrome - Plan: Thyroid peroxidase antibody, Thyroid Panel With TSH, CANCELED: Thyroid Panel With TSH, CANCELED: Thyroid Panel With TSH  Other fatigue  Tachycardia - Plan: Thyroid peroxidase antibody, Thyroid Panel With TSH  Headache around the eyes Vasculitis considered given her previously  elevated ESR . However her ESR is normal now in spite of persistent headache and double vision .  Continue follow up with Dr George Ina  Lab Results  Component Value Date   ESRSEDRATE 14 07/24/2019   Lab Results  Component Value Date   WBC 7.8 07/24/2019   HGB 12.1 07/24/2019   HCT 37.7 07/24/2019   MCV 90.2 07/24/2019   PLT 193 07/24/2019     Other fatigue CBC is normal.  Post viral fatigue may be due to thyroiditis given tachyardia,  Asked to return for thyroid evaluation     I discussed the assessment and treatment plan with the patient. The patient was provided an opportunity to ask questions and all were answered. The patient agreed with the plan and demonstrated an understanding of the instructions.   The patient was advised to call back or seek an in-person evaluation if the symptoms worsen or if the condition fails to improve as anticipated.  I provided 30  minutes of non-face-to-face time during this encounter reviewing patient's current problems and past imaging studies/labs  and ER visits,    Providing counseling on the above mentioned problems ,  and coordination  of care .  Crecencio Mc, MD

## 2019-07-25 ENCOUNTER — Other Ambulatory Visit: Payer: Self-pay | Admitting: Internal Medicine

## 2019-07-25 DIAGNOSIS — R5383 Other fatigue: Secondary | ICD-10-CM

## 2019-07-25 NOTE — Assessment & Plan Note (Signed)
Vasculitis considered given her previously  elevated ESR . However her ESR is normal now in spite of persistent headache and double vision .  Continue follow up with Dr George Ina  Lab Results  Component Value Date   ESRSEDRATE 14 07/24/2019   Lab Results  Component Value Date   WBC 7.8 07/24/2019   HGB 12.1 07/24/2019   HCT 37.7 07/24/2019   MCV 90.2 07/24/2019   PLT 193 07/24/2019

## 2019-07-25 NOTE — Assessment & Plan Note (Signed)
CBC is normal.  Post viral fatigue may be due to thyroiditis given tachyardia,  Asked to return for thyroid evaluation

## 2019-07-26 DIAGNOSIS — H2000 Unspecified acute and subacute iridocyclitis: Secondary | ICD-10-CM | POA: Diagnosis not present

## 2019-07-30 ENCOUNTER — Other Ambulatory Visit: Payer: 59

## 2019-07-31 ENCOUNTER — Other Ambulatory Visit: Payer: Self-pay | Admitting: Endocrinology

## 2019-07-31 ENCOUNTER — Encounter: Payer: Self-pay | Admitting: Endocrinology

## 2019-07-31 ENCOUNTER — Other Ambulatory Visit: Payer: Self-pay

## 2019-07-31 ENCOUNTER — Other Ambulatory Visit (INDEPENDENT_AMBULATORY_CARE_PROVIDER_SITE_OTHER): Payer: 59

## 2019-07-31 DIAGNOSIS — E1165 Type 2 diabetes mellitus with hyperglycemia: Secondary | ICD-10-CM

## 2019-07-31 DIAGNOSIS — R000 Tachycardia, unspecified: Secondary | ICD-10-CM | POA: Diagnosis not present

## 2019-07-31 DIAGNOSIS — G933 Postviral fatigue syndrome: Secondary | ICD-10-CM | POA: Diagnosis not present

## 2019-07-31 DIAGNOSIS — G9331 Postviral fatigue syndrome: Secondary | ICD-10-CM

## 2019-07-31 MED ORDER — TRESIBA FLEXTOUCH 100 UNIT/ML ~~LOC~~ SOPN: 60.0000 [IU] | PEN_INJECTOR | Freq: Every day | SUBCUTANEOUS | 2 refills | Status: DC

## 2019-07-31 MED ORDER — INSULIN LISPRO (1 UNIT DIAL) 100 UNIT/ML (KWIKPEN): 25.0000 [IU] | PEN_INJECTOR | Freq: Three times a day (TID) | SUBCUTANEOUS | 3 refills | Status: DC

## 2019-08-01 LAB — THYROID PANEL WITH TSH
Free Thyroxine Index: 1.9 (ref 1.4–3.8)
T3 Uptake: 32 % (ref 22–35)
T4, Total: 5.8 ug/dL (ref 5.1–11.9)
TSH: 0.68 mIU/L

## 2019-08-01 LAB — THYROID PEROXIDASE ANTIBODY: Thyroperoxidase Ab SerPl-aCnc: <1 IU/mL (ref <9)

## 2019-08-02 DIAGNOSIS — H2000 Unspecified acute and subacute iridocyclitis: Secondary | ICD-10-CM | POA: Diagnosis not present

## 2019-08-02 LAB — HM DIABETES EYE EXAM

## 2019-08-05 MED FILL — ATORVASTATIN 20 MG TABLET: 20 | 90 days supply | Qty: 90 | Fill #1

## 2019-08-06 ENCOUNTER — Other Ambulatory Visit (HOSPITAL_COMMUNITY): Payer: Self-pay | Admitting: Endocrinology

## 2019-08-06 ENCOUNTER — Other Ambulatory Visit: Payer: Self-pay

## 2019-08-06 DIAGNOSIS — E1165 Type 2 diabetes mellitus with hyperglycemia: Secondary | ICD-10-CM

## 2019-08-06 MED ORDER — "PEN NEEDLES 5/16"" 31G X 8 MM MISC": 1.0000 | Freq: Four times a day (QID) | 0 refills | Status: DC

## 2019-08-06 MED ORDER — TRESIBA FLEXTOUCH 100 UNIT/ML ~~LOC~~ SOPN: 60.0000 [IU] | PEN_INJECTOR | Freq: Every day | SUBCUTANEOUS | 0 refills | Status: DC

## 2019-08-06 MED ORDER — INSULIN LISPRO (1 UNIT DIAL) 100 UNIT/ML (KWIKPEN): 25.0000 [IU] | PEN_INJECTOR | Freq: Three times a day (TID) | SUBCUTANEOUS | 0 refills | Status: DC

## 2019-08-06 MED FILL — HUMALOG 100 UNITS/ML KWIKPE: 100 | 28 days supply | Qty: 21 | Fill #0

## 2019-08-06 MED FILL — UNIFINE PENTIPS 32GX5/32: 32G X 4 MM | 25 days supply | Qty: 100 | Fill #0

## 2019-08-06 MED FILL — TRESIBA FLEXTOUCH 100 UNITS: 100 | 29 days supply | Qty: 18 | Fill #0

## 2019-08-06 NOTE — Telephone Encounter (Signed)
Please advise which Rx's she is referring to and I will be happy to re-send

## 2019-08-06 NOTE — Telephone Encounter (Signed)
Both Rx's were marked as "NO PRINT". Rx's have been edited to "NORMAL" and re-sent to [facility].

## 2019-08-07 ENCOUNTER — Encounter: Payer: Self-pay | Admitting: Internal Medicine

## 2019-08-07 ENCOUNTER — Ambulatory Visit (INDEPENDENT_AMBULATORY_CARE_PROVIDER_SITE_OTHER): Payer: 59 | Admitting: Internal Medicine

## 2019-08-07 ENCOUNTER — Other Ambulatory Visit: Payer: Self-pay

## 2019-08-07 DIAGNOSIS — G933 Postviral fatigue syndrome: Secondary | ICD-10-CM | POA: Diagnosis not present

## 2019-08-07 DIAGNOSIS — G9331 Postviral fatigue syndrome: Secondary | ICD-10-CM

## 2019-08-07 MED ORDER — DIAZEPAM 2 MG PO TABS: 2.0000 mg | ORAL_TABLET | Freq: Four times a day (QID) | ORAL | 5 refills | Status: DC | PRN

## 2019-08-07 MED ORDER — CELECOXIB 200 MG PO CAPS: 200.0000 mg | ORAL_CAPSULE | Freq: Two times a day (BID) | ORAL | 0 refills | Status: DC | PRN

## 2019-08-07 MED FILL — CELECOXIB 200 MG CAP: 200 | 22 days supply | Qty: 45 | Fill #0

## 2019-08-07 MED FILL — diazePAM 2 MG TABS: 2 | 8 days supply | Qty: 30 | Fill #0

## 2019-08-07 NOTE — Patient Instructions (Addendum)
No more toradol.  Too hard on kidneys. .  Use celebrex 200 mg daily or twice daily (twice daily for only 5 days ) and   You can add up to 2000 mg of acetominophen (tylenol) every day safely  In divided doses (500 mg every 6 hours  Or 1000 mg every 12 hours.)  RTC 2 weeks for recheck on your progress and readiness to return to work full time

## 2019-08-07 NOTE — Progress Notes (Signed)
Subjective:  Patient ID: [name], female    DOB: 08-08-1970  Age: 49 y.o. MRN: [id number]  CC: The encounter diagnosis was Postviral fatigue syndrome.  HPI [name] presents for follow upon post COVID SYNDROME  This visit occurred during the SARS-CoV-2 public health emergency.  Safety protocols were in place, including screening questions prior to the visit, additional usage of staff PPE, and extensive cleaning of exam room while observing appropriate contact time as indicated for disinfecting solutions.   Patient was hospitalized from Dec 24 to Dec 27 with hypoxic respiratory failure secondary to COVID 19 infection.  She was diagnosed on Dec 16, suffered a severe allergic reaction to the antibody infusion she received on Dec 18,  requiring treatment in ER with epinephrine,  Steroids.   Later that week she developed pleurisy and hypoxia and was admitted on Dec 24  With bilateral pulmonary infiltrates . She received supplemental oxygen, Remdesivir and Decadron as well as 24 hours of empiric antibiotics (ceftriaxone and azithromycin).  Her Hypoxia and pleurisy resolved prior to discharge on Dec 27.  Her recovery has been slow due to multiple unresolved symptoms.  Since discharge she has had a constellation of disabling symptoms including tachycardia,  recurrent bilateral temple headaches, excessive fatigue and vision changes including double vision.  Has had weekly follow up  With  Dr George Ina.  Repeat ESR was normal (repeated by me ) She returned to work  on Monday and has worked two full days.  She reports  being totally exhausted after a full day, in spite of takinga nap in her car durin her lunch break.[name]  Requiring 10 to 14 hours of sleep ; her sleep is described as deep,  With no waking for bathroom breaks in 12 hours !.  Feels that her stamina , concentration starts to suffer around noon,  And her fatigue results in tachycardia with a pulse that jumps to 130 with exertion by around  noon. She became fatigued this morning just applying moisturizer .    She continues to suffer from bilateral eye pain , Eyes still hurting , bloodshot,  Eyelids still swollen.  Ophthalmology exam improving but vision is worse  And new rx for glasses  Written.  Still on prednisolone drops  And awaiting new glasses.  per last eval by Porfilio. Last visit feb 27  Vision is blurry by end of day; driving home has been difficult due to blurred vision.  Outpatient Medications Prior to Visit  Medication Sig Dispense Refill  . albuterol (VENTOLIN HFA) 108 (90 Base) MCG/ACT inhaler Inhale 2 puffs into the lungs every 6 (six) hours as needed for wheezing or shortness of breath. 18 g 0  . ALPRAZolam (XANAX) 0.5 MG tablet Take 1 tablet (0.5 mg total) by mouth 3 (three) times daily as needed (Headache).    [name] aspirin EC 81 MG tablet Take 81 mg by mouth daily.    [name] atorvastatin (LIPITOR) 20 MG tablet Take 1 tablet (20 mg total) by mouth daily at 6 PM.    . benzonatate (TESSALON) 100 MG capsule Take 1 capsule (100 mg total) by mouth 3 (three) times daily as needed for cough. 21 capsule 0  . Blood Glucose Monitoring Suppl (FREESTYLE LITE) DEVI 1 each by Does not apply route 2 (two) times daily. E11.9 1 each 0  . butalbital-acetaminophen-caffeine (FIORICET) 50-325-40 MG tablet Take 1 tablet by mouth every 6 (six) hours as needed for headache. Do not refill in less than 30  day 30 tablet 1  . diltiazem (CARDIZEM LA) 240 MG 24 hr tablet Take 1 tablet (240 mg total) by mouth daily.    [name] EPINEPHrine 0.3 mg/0.3 mL IJ SOAJ injection Inject 0.3 mLs (0.3 mg total) into the muscle as needed (for allergic reaction). 1 each 3  . ergocalciferol (DRISDOL) 1.25 MG (50000 UT) capsule Take 1 capsule (50,000 Units total) by mouth once a week. 12 capsule 0  . Fremanezumab-vfrm (AJOVY) 225 MG/1.5ML SOAJ Inject 675 mg into the skin every 3 (three) months. 3 pen 3  . glucose blood (FREESTYLE LITE) test strip 1 each by Other route 2 (two)  times daily. E11.9 200 each 0  . hydrochlorothiazide (MICROZIDE) 12.5 MG capsule TAKE 1 CAPSULE BY MOUTH DAILY. 90 capsule 0  . insulin degludec (TRESIBA FLEXTOUCH) 100 UNIT/ML SOPN FlexTouch Pen Inject 0.6 mLs (60 Units total) into the skin daily. 54 mL 0  . insulin lispro (HUMALOG KWIKPEN) 100 UNIT/ML KwikPen Inject 0.25 mLs (25 Units total) into the skin 3 (three) times daily. 67.5 mL 0  . Insulin Pen Needle (PEN NEEDLES 31GX5/16") 31G X 8 MM MISC 1 each by Does not apply route in the morning, at noon, in the evening, and at bedtime. E11.9 360 each 0  . ISOPTO ATROPINE 1 % ophthalmic solution     . ketorolac (TORADOL) 10 MG tablet Take 1 tablet (10 mg total) by mouth every 6 (six) hours as needed. (Patient taking differently: Take 10 mg by mouth every 6 (six) hours as needed (Headache). ) 30 tablet 1  . Lancets (FREESTYLE) lancets 1 each by Other route 2 (two) times daily. E11.9 200 each 0  . losartan (COZAAR) 50 MG tablet TAKE 1 TABLET BY MOUTH DAILY. 90 tablet 0  . metFORMIN (GLUCOPHAGE-XR) 500 MG 24 hr tablet Take 2 tablets (1,000 mg total) by mouth 2 (two) times daily. 360 tablet 2  . metoprolol succinate (TOPROL-XL) 25 MG 24 hr tablet Take 0.5 tablets (12.5 mg total) by mouth daily. 45 tablet 3  . ondansetron (ZOFRAN ODT) 4 MG disintegrating tablet Take 1 tablet (4 mg total) by mouth every 8 (eight) hours as needed for nausea or vomiting. 20 tablet 0  . prednisoLONE acetate (PRED FORTE) 1 % ophthalmic suspension     . VYVANSE 50 MG capsule     . diazepam (VALIUM) 2 MG tablet Take 2 mg by mouth every 6 (six) hours as needed (Headache).      No facility-administered medications prior to visit.    Review of Systems;  Patient denies , fevers, malaise, unintentional weight loss, skin rash,, sinus congestion and sinus pain, sore throat, dysphagia,  hemoptysis , cough, dyspnea, wheezing, chest pain, palpitations, orthopnea, edema, abdominal pain, nausea, melena, diarrhea, constipation, flank  pain, dysuria, hematuria, urinary  Frequency, nocturia, numbness, tingling, seizures,  Focal weakness, Loss of consciousness,  Tremor, insomnia, depression, anxiety, and suicidal ideation.      Objective:  BP 106/70 (BP Location: Left Arm, Patient Position: Sitting, Cuff Size: Large)   Pulse 76   Temp 97.9 F (36.6 C) (Temporal)   Resp 15   Ht 5' 6" (1.676 m)   Wt 227 lb (103 kg)   SpO2 97%   BMI 36.64 kg/m   BP Readings from Last 3 Encounters:  08/07/19 106/70  06/12/19 128/88  06/08/19 135/80    Wt Readings from Last 3 Encounters:  08/07/19 227 lb (103 kg)  07/24/19 230 lb (104.3 kg)  06/12/19 230 lb (104.3 kg)  General appearance: alert, cooperative and appears stated age Ears: normal TM's and external ear canals both ears Throat: lips, mucosa, and tongue normal; teeth and gums normal Neck: no adenopathy, no carotid bruit, supple, symmetrical, trachea midline and thyroid not enlarged, symmetric, no tenderness/mass/nodules Back: symmetric, no curvature. ROM normal. No CVA tenderness. Lungs: clear to auscultation bilaterally Heart: regular rate and rhythm, S1, S2 normal, no murmur, click, rub or gallop Abdomen: soft, non-tender; bowel sounds normal; no masses,  no organomegaly Pulses: 2+ and symmetric Skin: Skin color, texture, turgor normal. No rashes or lesions Lymph nodes: Cervical, supraclavicular, and axillary nodes normal.  Lab Results  Component Value Date   HGBA1C 8.5 (H) 05/30/2019   HGBA1C 7.5 (A) 04/05/2019   HGBA1C 8.0 (H) 10/23/2018    Lab Results  Component Value Date   CREATININE 0.73 06/08/2019   CREATININE 0.86 06/02/2019   CREATININE 0.82 06/01/2019    Lab Results  Component Value Date   WBC 7.8 07/24/2019   HGB 12.1 07/24/2019   HCT 37.7 07/24/2019   PLT 193 07/24/2019   GLUCOSE 333 (H) 06/08/2019   CHOL 136 04/23/2018   TRIG 71 04/23/2018   HDL 39 (L) 04/23/2018   LDLDIRECT 98.0 08/17/2016   LDLCALC 83 04/23/2018   ALT 23  06/02/2019   AST 19 06/02/2019   NA 134 (L) 06/08/2019   K 4.4 06/08/2019   CL 100 06/08/2019   CREATININE 0.73 06/08/2019   BUN 26 (H) 06/08/2019   CO2 21 (L) 06/08/2019   TSH 0.68 07/31/2019   HGBA1C 8.5 (H) 05/30/2019   MICROALBUR 2.5 (H) 10/23/2018    No results found.  Assessment & Plan:   Problem List Items Addressed This Visit      Unprioritized   Postviral fatigue syndrome    I am recommending that she reduce her work hours to 4 days per work , both for her safety and to prevent mistakes in judgement due to lack of concentration , for two weeks .  will reassess at that time         A total of 30 minutes was spent with patient more than half of which was spent in counseling patient on the above mentioned issues , reviewing and explaining recent labs and imaging studies done, and coordination of care.  I have changed Daneen Schick. Thoreson's diazepam. I am also having her start on celecoxib. Additionally, I am having her maintain her ketorolac, aspirin EC, ondansetron, metoprolol succinate, Ajovy, metFORMIN, albuterol, EPINEPHrine, atorvastatin, diltiazem, ALPRAZolam, benzonatate, butalbital-acetaminophen-caffeine, ergocalciferol, losartan, hydrochlorothiazide, FREESTYLE LITE, FreeStyle Lite, freestyle, Isopto Atropine, Vyvanse, prednisoLONE acetate, [name] FlexTouch, insulin lispro, and PEN NEEDLES 31GX5/16".  Meds ordered this encounter  Medications  . celecoxib (CELEBREX) 200 MG capsule    Sig: Take 1 capsule (200 mg total) by mouth 2 (two) times daily as needed.    Dispense:  45 capsule    Refill:  0  . diazepam (VALIUM) 2 MG tablet    Sig: Take 1 tablet (2 mg total) by mouth every 6 (six) hours as needed (Headache).    Dispense:  30 tablet    Refill:  5    Medications Discontinued During This Encounter  Medication Reason  . diazepam (VALIUM) 2 MG tablet Reorder    Follow-up: Return in about 2 weeks (around 08/21/2019).   Crecencio Mc, MD

## 2019-08-08 NOTE — Assessment & Plan Note (Addendum)
I am recommending that she reduce her work hours to 4 days per work , both for her safety and to prevent mistakes in judgement due to lack of concentration , for two weeks .  will reassess at that time

## 2019-08-14 ENCOUNTER — Other Ambulatory Visit: Payer: Self-pay

## 2019-08-14 ENCOUNTER — Telehealth: Payer: Self-pay | Admitting: Nutrition

## 2019-08-14 NOTE — Telephone Encounter (Signed)
Patient reports that the Tandem pump is too expensive for her, and she will talk to Dr. Loanne Drilling about the V-Go, that he suggested in January.  I explained to her that we can still order her a CGM, but she will need to let Dr. Loanne Drilling know if she wants this as well.

## 2019-08-15 ENCOUNTER — Encounter: Payer: Self-pay | Admitting: Endocrinology

## 2019-08-15 ENCOUNTER — Ambulatory Visit: Payer: 59 | Admitting: Endocrinology

## 2019-08-15 VITALS — BP 124/82 | HR 90 | Ht 66.0 in | Wt 226.2 lb

## 2019-08-15 DIAGNOSIS — E1165 Type 2 diabetes mellitus with hyperglycemia: Secondary | ICD-10-CM | POA: Diagnosis not present

## 2019-08-15 LAB — POCT GLYCOSYLATED HEMOGLOBIN (HGB A1C): Hemoglobin A1C: 7.3 % — AB (ref 4.0–5.6)

## 2019-08-15 MED ORDER — OZEMPIC (0.25 OR 0.5 MG/DOSE) 2 MG/1.5ML ~~LOC~~ SOPN: 0.2500 mg | PEN_INJECTOR | SUBCUTANEOUS | 3 refills | Status: DC

## 2019-08-15 MED ORDER — TRESIBA FLEXTOUCH 100 UNIT/ML ~~LOC~~ SOPN: 45.0000 [IU] | PEN_INJECTOR | Freq: Every day | SUBCUTANEOUS | 0 refills | Status: DC

## 2019-08-15 MED FILL — OZEMPIC 0.25 OR 0.5 MG/DOSE: 2 | 56 days supply | Qty: 2 | Fill #0

## 2019-08-15 NOTE — Progress Notes (Signed)
Subjective:    Patient ID: [name], female    DOB: [birth year], 49 y.o.   MRN: [id number]  HPI Pt returns for f/u of diabetes mellitus: DM type: Insulin-requiring type 2 Dx'ed: 0000000 Complications: DN Therapy: insulin since 2018, and metformin.   GDM: 1990 and 2004 DKA: never Severe hypoglycemia: never Pancreatitis: never Pancreatic imaging: normal on 2019 CT.  SDOH: none Other: She did not tolerate Jardiance (vaginitis) or Victoza (dysphagia); she takes multiple daily injections (but she takes humalog BID, as she eats 2 meals per day); she works 1st shift.   Interval history: no further steroids.  Over the past few days, cbg varies from 77-238.  It is in general higher as the day goes on. She wants to take V-GO. Past Medical History:  Diagnosis Date  . Allergy   . Anemia   . Anxiety    claustrophobic  . Asthma   . Back pain   . Biceps tendonosis of right shoulder   . Diabetes mellitus 2011   did not start metforfin, losing weight  . Dyspnea   . Fatty liver   . Food allergy   . Headache disorder   . History of concussion   . HLD (hyperlipidemia)   . Hypertension   . IBS (irritable bowel syndrome)   . Infertility, female   . Joint pain   . Lactose intolerance   . Leg edema   . Migraines   . Other specified disorders of thyroid   . Palpitation   . Post-menopausal   . Seborrheic dermatitis   . Shoulder impingement syndrome, right   . Vitamin D deficiency     Past Surgical History:  Procedure Laterality Date  . ABDOMINAL HYSTERECTOMY  2006   heavy menses, endometriosis, l oophrectomy  . BACK SURGERY    . BREAST BIOPSY Right 2018   benign  . BREAST EXCISIONAL BIOPSY Right 2003   benign  . BREAST EXCISIONAL BIOPSY Right 1999   benign  . BREAST SURGERY     right breast x 2 , benign  . HERNIA REPAIR  2003   left inguinal   . LEFT OOPHORECTOMY    . SHOULDER ARTHROSCOPY WITH SUBACROMIAL DECOMPRESSION AND OPEN ROTATOR C Right 07/14/2016   Procedure:  RIGHT SHOULDER ARTHROSCOPY WITH SUBACROMIAL DECOMPRESSION, DISTAL CLAVICLE RESECTION AND MINI OPEN ROTATOR CUFF REPAIR, OPEN BICEP TENDODESIS;  Surgeon: Garald Balding, MD;  Location: [place];  Service: Orthopedics;  Laterality: Right;  . SHOULDER CLOSED REDUCTION Right 09/08/2016   Procedure: RIGHT CLOSED MANIPULATION SHOULDER;  Surgeon: Garald Balding, MD;  Location: [place];  Service: Orthopedics;  Laterality: Right;  . TUBAL LIGATION      Social History   Socioeconomic History  . Marital status: Divorced    Spouse name: Not on file  . Number of children: 2  . Years of education: 73  . Highest education level: Master's degree (e.g., MA, MS, MEng, MEd, MSW, MBA)  Occupational History  . 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 Smoking status: Never Smoker  . Smokeless tobacco: Never Used  Substance and Sexual Activity  . Alcohol use: No  . Drug use: No  . Sexual activity: Not Currently    Partners: Male    Birth control/protection: Surgical  Other Topics Concern  . Not on file  Social History Narrative   Lives at home with son and daughter.   Right-handed.  1-3 cups caffeine weekly.   Social Determinants of Health   Financial Resource Strain:   . Difficulty of Paying Living Expenses:   Food Insecurity:   . Worried About Charity fundraiser in the Last Year:   . Arboriculturist in the Last Year:   Transportation Needs:   . Film/video editor (Medical):   [name] Lack of Transportation (Non-Medical):   Physical Activity:   . Days of Exercise per Week:   . Minutes of Exercise per Session:   Stress:   . Feeling of Stress :   Social Connections:   . Frequency of Communication with Friends and Family:   . Frequency of Social Gatherings with Friends and Family:   . Attends Religious Services:   . Active Member of Clubs or Organizations:   . Attends Archivist Meetings:   [name] Marital Status:    Intimate Partner Violence:   . Fear of Current or Ex-Partner:   . Emotionally Abused:   [name] Physically Abused:   . Sexually Abused:     Current Outpatient Medications on File Prior to Visit  Medication Sig Dispense Refill  . albuterol (VENTOLIN HFA) 108 (90 Base) MCG/ACT inhaler Inhale 2 puffs into the lungs every 6 (six) hours as needed for wheezing or shortness of breath. 18 g 0  . ALPRAZolam (XANAX) 0.5 MG tablet Take 1 tablet (0.5 mg total) by mouth 3 (three) times daily as needed (Headache).    [name] aspirin EC 81 MG tablet Take 81 mg by mouth daily.    [name] atorvastatin (LIPITOR) 20 MG tablet Take 1 tablet (20 mg total) by mouth daily at 6 PM.    . benzonatate (TESSALON) 100 MG capsule Take 1 capsule (100 mg total) by mouth 3 (three) times daily as needed for cough. 21 capsule 0  . Blood Glucose Monitoring Suppl (FREESTYLE LITE) DEVI 1 each by Does not apply route 2 (two) times daily. E11.9 1 each 0  . butalbital-acetaminophen-caffeine (FIORICET) 50-325-40 MG tablet Take 1 tablet by mouth every 6 (six) hours as needed for headache. Do not refill in less than 30 day 30 tablet 1  . celecoxib (CELEBREX) 200 MG capsule Take 1 capsule (200 mg total) by mouth 2 (two) times daily as needed. 45 capsule 0  . diazepam (VALIUM) 2 MG tablet Take 1 tablet (2 mg total) by mouth every 6 (six) hours as needed (Headache). 30 tablet 5  . diltiazem (CARDIZEM LA) 240 MG 24 hr tablet Take 1 tablet (240 mg total) by mouth daily.    [name] EPINEPHrine 0.3 mg/0.3 mL IJ SOAJ injection Inject 0.3 mLs (0.3 mg total) into the muscle as needed (for allergic reaction). 1 each 3  . ergocalciferol (DRISDOL) 1.25 MG (50000 UT) capsule Take 1 capsule (50,000 Units total) by mouth once a week. 12 capsule 0  . Fremanezumab-vfrm (AJOVY) 225 MG/1.5ML SOAJ Inject 675 mg into the skin every 3 (three) months. 3 pen 3  . glucose blood (FREESTYLE LITE) test strip 1 each by Other route 2 (two) times daily. E11.9 200 each 0  .  hydrochlorothiazide (MICROZIDE) 12.5 MG capsule TAKE 1 CAPSULE BY MOUTH DAILY. 90 capsule 0  . insulin lispro (HUMALOG KWIKPEN) 100 UNIT/ML KwikPen Inject 0.25 mLs (25 Units total) into the skin 3 (three) times daily. 67.5 mL 0  . Insulin Pen Needle (PEN NEEDLES 31GX5/16") 31G X 8 MM MISC 1 each by Does not apply route in the morning, at noon, in the evening,  and at bedtime. E11.9 360 each 0  . ISOPTO ATROPINE 1 % ophthalmic solution     . ketorolac (TORADOL) 10 MG tablet Take 1 tablet (10 mg total) by mouth every 6 (six) hours as needed. (Patient taking differently: Take 10 mg by mouth every 6 (six) hours as needed (Headache). ) 30 tablet 1  . Lancets (FREESTYLE) lancets 1 each by Other route 2 (two) times daily. E11.9 200 each 0  . losartan (COZAAR) 50 MG tablet TAKE 1 TABLET BY MOUTH DAILY. 90 tablet 0  . metFORMIN (GLUCOPHAGE-XR) 500 MG 24 hr tablet Take 2 tablets (1,000 mg total) by mouth 2 (two) times daily. 360 tablet 2  . metoprolol succinate (TOPROL-XL) 25 MG 24 hr tablet Take 0.5 tablets (12.5 mg total) by mouth daily. 45 tablet 3  . ondansetron (ZOFRAN ODT) 4 MG disintegrating tablet Take 1 tablet (4 mg total) by mouth every 8 (eight) hours as needed for nausea or vomiting. 20 tablet 0  . prednisoLONE acetate (PRED FORTE) 1 % ophthalmic suspension     . VYVANSE 50 MG capsule      No current facility-administered medications on file prior to visit.    Allergies  Allergen Reactions  . Bamlanivimab Anaphylaxis, Itching, Palpitations, Rash, Shortness Of Breath and Swelling  . Botox [Onabotulinumtoxina] Shortness Of Breath    syncope  . Clindamycin/Lincomycin Other (See Comments)  . Kiwi Extract Shortness Of Breath  . Maxalt [Rizatriptan Benzoate] Anaphylaxis    Chest pain  . Nitrous Oxide Shortness Of Breath    syncope  . Triptans Shortness Of Breath  . Aspirin Nausea And Vomiting    Mouth blisters  . Contrast Media [Iodinated Diagnostic Agents]   . Flagyl [Metronidazole]   .  Hydrocodone-Acetaminophen Other (See Comments)  . Latex     Sometimes causes rash  . Mango Flavor   . Rizatriptan Other (See Comments)  . Vicodin [Hydrocodone-Acetaminophen]     hallucinations  . Flu Virus Vaccine Palpitations  . Penicillins Nausea And Vomiting, Rash and Other (See Comments)    Did it involve swelling of the face/tongue/throat, SOB, or low BP? No Did it involve sudden or severe rash/hives, skin peeling, or any reaction on the inside of your mouth or nose? No Did you need to seek medical attention at a hospital or doctor's office? Yes When did it last happen?patient was 49 years old If all above answers are "NO", may proceed with cephalosporin use.  . Tetracyclines & Related Nausea And Vomiting and Rash    Family History  Problem Relation Age of Onset  . Diabetes Mother   . Heart disease Mother 22  . Hypertension Mother   . Hyperlipidemia Mother   . Heart failure Mother   . Stroke Mother   . Thyroid disease Mother   . Depression Mother   . Sleep apnea Mother   . Obesity Mother   . Cancer Father 62       Lung Cancer  . Heart disease Father   . Mental illness Sister        bipolar, substance abuse,  clean 2 yrs  . Hypertension Sister   . Cancer Paternal Grandmother 36       breast cancer  . Breast cancer Paternal Grandmother   . Diabetes Maternal Grandmother   . Hypertension Maternal Grandmother   . Diabetes Son     BP 124/82 (BP Location: Left Arm, Patient Position: Sitting, Cuff Size: Large)   Pulse 90   Ht 5\' 6"  (  1.676 m)   Wt 226 lb 3.2 oz (102.6 kg)   SpO2 98%   BMI 36.51 kg/m    Review of Systems She denies hypoglycemia.      Objective:   Physical Exam VITAL SIGNS:  See vs page GENERAL: no distress Pulses: dorsalis pedis intact bilat.   MSK: no deformity of the feet CV: no leg edema Skin:  no ulcer on the feet.  normal color and temp on the feet. Neuro: sensation is intact to touch on the feet   Lab Results  Component Value  Date   HGBA1C 7.3 (A) 08/15/2019   Lab Results  Component Value Date   CREATININE 0.73 06/08/2019   BUN 26 (H) 06/08/2019   NA 134 (L) 06/08/2019   K 4.4 06/08/2019   CL 100 06/08/2019   CO2 21 (L) 06/08/2019       Assessment & Plan:  Insulin-requiring type 2 DM, with DN: She would benefit from increased rx, if it can be done with a regimen that avoids or minimizes hypoglycemia.  Current daily insulin need is too much to V-GO, even with the expected reduction going from multiple daily injections to pump   Patient Instructions  In order for the V-GO-40 to work, we'll need to reduce your daily insulin need.  I have sent a prescription to your pharmacy, to start [place]. Please reduce the Tresiba to 45 units daily, and: Please continue the same Humalog.   check your blood sugar twice a day.  vary the time of day when you check, between before the 3 meals, and at bedtime.  also check if you have symptoms of your blood sugar being too high or too low.  please keep a record of the readings and bring it to your next appointment here (or you can bring the meter itself).  You can write it on any piece of paper.  please call us sooner if your blood sugar goes below 70, or if you have a lot of readings over 200.  Please come back for a follow-up appointment in 1 month.

## 2019-08-15 NOTE — Patient Instructions (Addendum)
In order for the V-GO-40 to work, we'll need to reduce your daily insulin need.  I have sent a prescription to your pharmacy, to start [place]. Please reduce the Tresiba to 45 units daily, and: Please continue the same Humalog.   check your blood sugar twice a day.  vary the time of day when you check, between before the 3 meals, and at bedtime.  also check if you have symptoms of your blood sugar being too high or too low.  please keep a record of the readings and bring it to your next appointment here (or you can bring the meter itself).  You can write it on any piece of paper.  please call us sooner if your blood sugar goes below 70, or if you have a lot of readings over 200.  Please come back for a follow-up appointment in 1 month.

## 2019-08-22 ENCOUNTER — Telehealth (INDEPENDENT_AMBULATORY_CARE_PROVIDER_SITE_OTHER): Payer: 59 | Admitting: Internal Medicine

## 2019-08-22 ENCOUNTER — Encounter: Payer: Self-pay | Admitting: Internal Medicine

## 2019-08-22 DIAGNOSIS — G933 Postviral fatigue syndrome: Secondary | ICD-10-CM

## 2019-08-22 DIAGNOSIS — G9331 Postviral fatigue syndrome: Secondary | ICD-10-CM

## 2019-08-22 NOTE — Progress Notes (Signed)
Virtual Visit via [place]   This visit type was conducted due to national recommendations for restrictions regarding the COVID-19 pandemic (e.g. social distancing).  This format is felt to be most appropriate for this patient at this time.  All issues noted in this document were discussed and addressed.  No physical exam was performed (except for noted visual exam findings with Video Visits).   I connected with@ on 08/22/19 at 11:30 AM EDT by a video enabled telemedicine application and verified that I am speaking with the correct person using two identifiers. Location patient: home Location provider: work or home office Persons participating in the virtual visit: patient, provider  I discussed the limitations, risks, security and privacy concerns of performing an evaluation and management service by telephone and the availability of in person appointments. I also discussed with the patient that there may be a patient responsible charge related to this service. The patient expressed understanding and agreed to proceed.  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  She has returned to work part time and  is working 4-5 hours daily.  Has unintentionally worked two 8 hours days  In the past week  And was unble to drive home due to due to worsening uveitis /iritis managed with prednisolone and celebrex.  She does not feel that she is able to return to work full time yet due to the significant exhaustion and worsening eye symptoms.  She will need her FMLA renewed for  2 more weeks   ROS: See pertinent positives and negatives per HPI.  Past Medical History:  Diagnosis Date  . Allergy   . Anemia   . Anxiety    claustrophobic  . Asthma   . Back pain   . Biceps tendonosis of right shoulder   . Diabetes mellitus 2011   did not start metforfin, losing weight  . Dyspnea   . Fatty liver   . Food allergy   . Headache disorder   .  History of concussion   . HLD (hyperlipidemia)   . Hypertension   . IBS (irritable bowel syndrome)   . Infertility, female   . Joint pain   . Lactose intolerance   . Leg edema   . Migraines   . Other specified disorders of thyroid   . Palpitation   . Post-menopausal   . Seborrheic dermatitis   . Shoulder impingement syndrome, right   . Vitamin D deficiency     Past Surgical History:  Procedure Laterality Date  . ABDOMINAL HYSTERECTOMY  2006   heavy menses, endometriosis, l oophrectomy  . BACK SURGERY    . BREAST BIOPSY Right 2018   benign  . BREAST EXCISIONAL BIOPSY Right 2003   benign  . BREAST EXCISIONAL BIOPSY Right 1999   benign  . BREAST SURGERY     right breast x 2 , benign  . HERNIA REPAIR  2003   left inguinal   . LEFT OOPHORECTOMY    . SHOULDER ARTHROSCOPY WITH SUBACROMIAL DECOMPRESSION AND OPEN ROTATOR C Right 07/14/2016   Procedure: RIGHT SHOULDER ARTHROSCOPY WITH SUBACROMIAL DECOMPRESSION, DISTAL CLAVICLE RESECTION AND MINI OPEN ROTATOR CUFF REPAIR, OPEN BICEP TENDODESIS;  Surgeon: Garald Balding, MD;  Location: [facility];  Service: Orthopedics;  Laterality: Right;  . SHOULDER CLOSED REDUCTION Right 09/08/2016   Procedure: RIGHT CLOSED MANIPULATION SHOULDER;  Surgeon: Garald Balding, MD;  Location: [facility];  Service:  Orthopedics;  Laterality: Right;  . TUBAL LIGATION      Family History  Problem Relation Age of Onset  . Diabetes Mother   . Heart disease Mother 68  . Hypertension Mother   . Hyperlipidemia Mother   . Heart failure Mother   . Stroke Mother   . Thyroid disease Mother   . Depression Mother   . Sleep apnea Mother   . Obesity Mother   . Cancer Father 106       Lung Cancer  . Heart disease Father   . Mental illness Sister        bipolar, substance abuse,  clean 2 yrs  . Hypertension Sister   . Cancer Paternal Grandmother 29       breast cancer  . Breast cancer Paternal Grandmother   . Diabetes  Maternal Grandmother   . Hypertension Maternal Grandmother   . Diabetes Son     SOCIAL HX:  reports that she has never smoked. She has never used smokeless tobacco. She reports that she does not drink alcohol or use drugs.   Current Outpatient Medications:  .  albuterol (VENTOLIN HFA) 108 (90 Base) MCG/ACT inhaler, Inhale 2 puffs into the lungs every 6 (six) hours as needed for wheezing or shortness of breath., Disp: 18 g, Rfl: 0 .  ALPRAZolam (XANAX) 0.5 MG tablet, Take 1 tablet (0.5 mg total) by mouth 3 (three) times daily as needed (Headache)., Disp: , Rfl:  .  aspirin EC 81 MG tablet, Take 81 mg by mouth daily., Disp: , Rfl:  .  atorvastatin (LIPITOR) 20 MG tablet, Take 1 tablet (20 mg total) by mouth daily at 6 PM., Disp: , Rfl:  .  benzonatate (TESSALON) 100 MG capsule, Take 1 capsule (100 mg total) by mouth 3 (three) times daily as needed for cough., Disp: 21 capsule, Rfl: 0 .  Blood Glucose Monitoring Suppl (FREESTYLE LITE) DEVI, 1 each by Does not apply route 2 (two) times daily. E11.9, Disp: 1 each, Rfl: 0 .  butalbital-acetaminophen-caffeine (FIORICET) 50-325-40 MG tablet, Take 1 tablet by mouth every 6 (six) hours as needed for headache. Do not refill in less than 30 day, Disp: 30 tablet, Rfl: 1 .  celecoxib (CELEBREX) 200 MG capsule, Take 1 capsule (200 mg total) by mouth 2 (two) times daily as needed., Disp: 45 capsule, Rfl: 0 .  diazepam (VALIUM) 2 MG tablet, Take 1 tablet (2 mg total) by mouth every 6 (six) hours as needed (Headache)., Disp: 30 tablet, Rfl: 5 .  diltiazem (CARDIZEM LA) 240 MG 24 hr tablet, Take 1 tablet (240 mg total) by mouth daily., Disp: , Rfl:  .  EPINEPHrine 0.3 mg/0.3 mL IJ SOAJ injection, Inject 0.3 mLs (0.3 mg total) into the muscle as needed (for allergic reaction)., Disp: 1 each, Rfl: 3 .  ergocalciferol (DRISDOL) 1.25 MG (50000 UT) capsule, Take 1 capsule (50,000 Units total) by mouth once a week., Disp: 12 capsule, Rfl: 0 .  Fremanezumab-vfrm (AJOVY)  225 MG/1.5ML SOAJ, Inject 675 mg into the skin every 3 (three) months., Disp: 3 pen, Rfl: 3 .  glucose blood (FREESTYLE LITE) test strip, 1 each by Other route 2 (two) times daily. E11.9, Disp: 200 each, Rfl: 0 .  hydrochlorothiazide (MICROZIDE) 12.5 MG capsule, TAKE 1 CAPSULE BY MOUTH DAILY., Disp: 90 capsule, Rfl: 0 .  insulin degludec (TRESIBA FLEXTOUCH) 100 UNIT/ML FlexTouch Pen, Inject 0.45 mLs (45 Units total) into the skin daily., Disp: 54 mL, Rfl: 0 .  insulin lispro (  HUMALOG KWIKPEN) 100 UNIT/ML KwikPen, Inject 0.25 mLs (25 Units total) into the skin 3 (three) times daily., Disp: 67.5 mL, Rfl: 0 .  Insulin Pen Needle (PEN NEEDLES 31GX5/16") 31G X 8 MM MISC, 1 each by Does not apply route in the morning, at noon, in the evening, and at bedtime. E11.9, Disp: 360 each, Rfl: 0 .  ISOPTO ATROPINE 1 % ophthalmic solution, , Disp: , Rfl:  .  ketorolac (TORADOL) 10 MG tablet, Take 1 tablet (10 mg total) by mouth every 6 (six) hours as needed. (Patient taking differently: Take 10 mg by mouth every 6 (six) hours as needed (Headache). ), Disp: 30 tablet, Rfl: 1 .  Lancets (FREESTYLE) lancets, 1 each by Other route 2 (two) times daily. E11.9, Disp: 200 each, Rfl: 0 .  losartan (COZAAR) 50 MG tablet, TAKE 1 TABLET BY MOUTH DAILY., Disp: 90 tablet, Rfl: 0 .  metFORMIN (GLUCOPHAGE-XR) 500 MG 24 hr tablet, Take 2 tablets (1,000 mg total) by mouth 2 (two) times daily., Disp: 360 tablet, Rfl: 2 .  metoprolol succinate (TOPROL-XL) 25 MG 24 hr tablet, Take 0.5 tablets (12.5 mg total) by mouth daily., Disp: 45 tablet, Rfl: 3 .  ondansetron (ZOFRAN ODT) 4 MG disintegrating tablet, Take 1 tablet (4 mg total) by mouth every 8 (eight) hours as needed for nausea or vomiting., Disp: 20 tablet, Rfl: 0 .  prednisoLONE acetate (PRED FORTE) 1 % ophthalmic suspension, , Disp: , Rfl:  .  Semaglutide,0.25 or 0.5MG /DOS, (OZEMPIC, 0.25 OR 0.5 MG/DOSE,) 2 MG/1.5ML SOPN, Inject 0.25 mg into the skin once a week., Disp: 2 pen, Rfl:  3 .  VYVANSE 50 MG capsule, , Disp: , Rfl:   EXAM:  VITALS per patient if applicable:  GENERAL: alert, oriented, appears well and in no acute distress  HEENT: atraumatic, conjunttiva clear, no obvious abnormalities on inspection of external nose and ears  NECK: normal movements of the head and neck  LUNGS: on inspection no signs of respiratory distress, breathing rate appears normal, no obvious gross SOB, gasping or wheezing  CV: no obvious cyanosis  MS: moves all visible extremities without noticeable abnormality  PSYCH/NEURO: pleasant and cooperative, no obvious depression or anxiety, speech and thought processing grossly intact  ASSESSMENT AND PLAN:  Discussed the following assessment and plan:  Postviral fatigue syndrome  Postviral fatigue syndrome She is advised to continue working reduced hours until her uveitis has improved .     I discussed the assessment and treatment plan with the patient. The patient was provided an opportunity to ask questions and all were answered. The patient agreed with the plan and demonstrated an understanding of the instructions.   The patient was advised to call back or seek an in-person evaluation if the symptoms worsen or if the condition fails to improve as anticipated.  I provided  20 minutes of non-face-to-face time during this encounter reviewing patient's current problems and past procedures/imaging studies, providing counseling on the above mentioned problems , and coordination  of care .  Crecencio Mc, MD

## 2019-08-25 NOTE — Assessment & Plan Note (Signed)
She is advised to continue working reduced hours until her uveitis has improved .

## 2019-08-26 MED FILL — OZEMPIC 0.25 OR 0.5 MG/DOSE: 2 | 56 days supply | Qty: 2 | Fill #0

## 2019-08-29 DIAGNOSIS — H2000 Unspecified acute and subacute iridocyclitis: Secondary | ICD-10-CM | POA: Diagnosis not present

## 2019-09-04 DIAGNOSIS — H2000 Unspecified acute and subacute iridocyclitis: Secondary | ICD-10-CM | POA: Diagnosis not present

## 2019-09-05 ENCOUNTER — Encounter: Payer: Self-pay | Admitting: Internal Medicine

## 2019-09-05 ENCOUNTER — Telehealth (INDEPENDENT_AMBULATORY_CARE_PROVIDER_SITE_OTHER): Payer: 59 | Admitting: Internal Medicine

## 2019-09-05 ENCOUNTER — Encounter: Payer: Self-pay | Admitting: Endocrinology

## 2019-09-05 DIAGNOSIS — H3023 Posterior cyclitis, bilateral: Secondary | ICD-10-CM | POA: Diagnosis not present

## 2019-09-05 MED ORDER — TRAMADOL HCL 50 MG PO TABS: 50.0000 mg | ORAL_TABLET | Freq: Three times a day (TID) | ORAL | 0 refills | Status: AC | PRN

## 2019-09-05 MED FILL — traMADol HCL 50 MG TABS: 50 | 5 days supply | Qty: 15 | Fill #0

## 2019-09-07 DIAGNOSIS — H3023 Posterior cyclitis, bilateral: Secondary | ICD-10-CM | POA: Insufficient documentation

## 2019-09-07 NOTE — Progress Notes (Signed)
Virtual Visit via [place]  This visit type was conducted due to national recommendations for restrictions regarding the COVID-19 pandemic (e.g. social distancing).  This format is felt to be most appropriate for this patient at this time.  All issues noted in this document were discussed and addressed.  No physical exam was performed (except for noted visual exam findings with Video Visits).   I connected with@ on 09/05/19 at 11:00 AM EDT by a video enabled telemedicine application  and verified that I am speaking with the correct person using two identifiers. Location patient: home Location provider: work or home office Persons participating in the virtual visit: patient, provider  I discussed the limitations, risks, security and privacy concerns of performing an evaluation and management service by telephone and the availability of in person appointments. I also discussed with the patient that there may be a patient responsible charge related to this service. The patient expressed understanding and agreed to proceed.  Reason for visit: follow up  HPI:  49 yr old with persistent uveitis following a COVID 19 infection in January.  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 She has had a repeat evaluation by Dr George Ina who confirms that her orbital swelling and uveitis is slightly worse and has referred her to a specialist. She feels that she is able to continue to work from  home f she is allowed to work in 2 hour intervals followed by an hour of rest.  Given her blurred vision, I agree that working from home is her only current option .   ROS: See pertinent positives and negatives per HPI.  Past Medical History:  Diagnosis Date  . Allergy   . Anemia   . Anxiety    claustrophobic  . Asthma   . Back pain   . Biceps tendonosis of right shoulder   . Diabetes mellitus 2011   did not start metforfin, losing weight  .  Dyspnea   . Fatty liver   . Food allergy   . Headache disorder   . History of concussion   . HLD (hyperlipidemia)   . Hypertension   . IBS (irritable bowel syndrome)   . Infertility, female   . Joint pain   . Lactose intolerance   . Leg edema   . Migraines   . Other specified disorders of thyroid   . Palpitation   . Post-menopausal   . Seborrheic dermatitis   . Shoulder impingement syndrome, right   . Vitamin D deficiency     Past Surgical History:  Procedure Laterality Date  . ABDOMINAL HYSTERECTOMY  2006   heavy menses, endometriosis, l oophrectomy  . BACK SURGERY    . BREAST BIOPSY Right 2018   benign  . BREAST EXCISIONAL BIOPSY Right 2003   benign  . BREAST EXCISIONAL BIOPSY Right 1999   benign  . BREAST SURGERY     right breast x 2 , benign  . HERNIA REPAIR  2003   left inguinal   . LEFT OOPHORECTOMY    . SHOULDER ARTHROSCOPY WITH SUBACROMIAL DECOMPRESSION AND OPEN ROTATOR C Right 07/14/2016   Procedure: RIGHT SHOULDER ARTHROSCOPY WITH SUBACROMIAL DECOMPRESSION, DISTAL CLAVICLE RESECTION AND MINI OPEN ROTATOR CUFF REPAIR, OPEN BICEP TENDODESIS;  Surgeon: Garald Balding, MD;  Location: [facility];  Service: Orthopedics;  Laterality: Right;  . SHOULDER CLOSED REDUCTION Right 09/08/2016   Procedure: RIGHT CLOSED MANIPULATION SHOULDER;  Surgeon: Vonna Kotyk  Durward Fortes, MD;  Location: [facility];  Service: Orthopedics;  Laterality: Right;  . TUBAL LIGATION      Family History  Problem Relation Age of Onset  . Diabetes Mother   . Heart disease Mother 76  . Hypertension Mother   . Hyperlipidemia Mother   . Heart failure Mother   . Stroke Mother   . Thyroid disease Mother   . Depression Mother   . Sleep apnea Mother   . Obesity Mother   . Cancer Father 29       Lung Cancer  . Heart disease Father   . Mental illness Sister        bipolar, substance abuse,  clean 2 yrs  . Hypertension Sister   . Cancer Paternal Grandmother 44        breast cancer  . Breast cancer Paternal Grandmother   . Diabetes Maternal Grandmother   . Hypertension Maternal Grandmother   . Diabetes Son     SOCIAL HX:  reports that she has never smoked. She has never used smokeless tobacco. She reports that she does not drink alcohol or use drugs.   Current Outpatient Medications:  .  albuterol (VENTOLIN HFA) 108 (90 Base) MCG/ACT inhaler, Inhale 2 puffs into the lungs every 6 (six) hours as needed for wheezing or shortness of breath., Disp: 18 g, Rfl: 0 .  ALPRAZolam (XANAX) 0.5 MG tablet, Take 1 tablet (0.5 mg total) by mouth 3 (three) times daily as needed (Headache)., Disp: , Rfl:  .  aspirin EC 81 MG tablet, Take 81 mg by mouth daily., Disp: , Rfl:  .  atorvastatin (LIPITOR) 20 MG tablet, Take 1 tablet (20 mg total) by mouth daily at 6 PM., Disp: , Rfl:  .  benzonatate (TESSALON) 100 MG capsule, Take 1 capsule (100 mg total) by mouth 3 (three) times daily as needed for cough., Disp: 21 capsule, Rfl: 0 .  Blood Glucose Monitoring Suppl (FREESTYLE LITE) DEVI, 1 each by Does not apply route 2 (two) times daily. E11.9, Disp: 1 each, Rfl: 0 .  butalbital-acetaminophen-caffeine (FIORICET) 50-325-40 MG tablet, Take 1 tablet by mouth every 6 (six) hours as needed for headache. Do not refill in less than 30 day, Disp: 30 tablet, Rfl: 1 .  celecoxib (CELEBREX) 200 MG capsule, Take 1 capsule (200 mg total) by mouth 2 (two) times daily as needed., Disp: 45 capsule, Rfl: 0 .  diazepam (VALIUM) 2 MG tablet, Take 1 tablet (2 mg total) by mouth every 6 (six) hours as needed (Headache)., Disp: 30 tablet, Rfl: 5 .  diltiazem (CARDIZEM LA) 240 MG 24 hr tablet, Take 1 tablet (240 mg total) by mouth daily., Disp: , Rfl:  .  EPINEPHrine 0.3 mg/0.3 mL IJ SOAJ injection, Inject 0.3 mLs (0.3 mg total) into the muscle as needed (for allergic reaction)., Disp: 1 each, Rfl: 3 .  ergocalciferol (DRISDOL) 1.25 MG (50000 UT) capsule, Take 1 capsule (50,000 Units total) by mouth  once a week., Disp: 12 capsule, Rfl: 0 .  Fremanezumab-vfrm (AJOVY) 225 MG/1.5ML SOAJ, Inject 675 mg into the skin every 3 (three) months., Disp: 3 pen, Rfl: 3 .  glucose blood (FREESTYLE LITE) test strip, 1 each by Other route 2 (two) times daily. E11.9, Disp: 200 each, Rfl: 0 .  hydrochlorothiazide (MICROZIDE) 12.5 MG capsule, TAKE 1 CAPSULE BY MOUTH DAILY., Disp: 90 capsule, Rfl: 0 .  insulin degludec (TRESIBA FLEXTOUCH) 100 UNIT/ML FlexTouch Pen, Inject 0.45 mLs (45 Units total) into the skin  daily., Disp: 54 mL, Rfl: 0 .  insulin lispro (HUMALOG KWIKPEN) 100 UNIT/ML KwikPen, Inject 0.25 mLs (25 Units total) into the skin 3 (three) times daily., Disp: 67.5 mL, Rfl: 0 .  Insulin Pen Needle (PEN NEEDLES 31GX5/16") 31G X 8 MM MISC, 1 each by Does not apply route in the morning, at noon, in the evening, and at bedtime. E11.9, Disp: 360 each, Rfl: 0 .  ISOPTO ATROPINE 1 % ophthalmic solution, , Disp: , Rfl:  .  ketorolac (TORADOL) 10 MG tablet, Take 1 tablet (10 mg total) by mouth every 6 (six) hours as needed. (Patient taking differently: Take 10 mg by mouth every 6 (six) hours as needed (Headache). ), Disp: 30 tablet, Rfl: 1 .  Lancets (FREESTYLE) lancets, 1 each by Other route 2 (two) times daily. E11.9, Disp: 200 each, Rfl: 0 .  losartan (COZAAR) 50 MG tablet, TAKE 1 TABLET BY MOUTH DAILY., Disp: 90 tablet, Rfl: 0 .  metFORMIN (GLUCOPHAGE-XR) 500 MG 24 hr tablet, Take 2 tablets (1,000 mg total) by mouth 2 (two) times daily., Disp: 360 tablet, Rfl: 2 .  metoprolol succinate (TOPROL-XL) 25 MG 24 hr tablet, Take 0.5 tablets (12.5 mg total) by mouth daily., Disp: 45 tablet, Rfl: 3 .  ondansetron (ZOFRAN ODT) 4 MG disintegrating tablet, Take 1 tablet (4 mg total) by mouth every 8 (eight) hours as needed for nausea or vomiting., Disp: 20 tablet, Rfl: 0 .  prednisoLONE acetate (PRED FORTE) 1 % ophthalmic suspension, , Disp: , Rfl:  .  Semaglutide,0.25 or 0.5MG /DOS, (OZEMPIC, 0.25 OR 0.5 MG/DOSE,) 2  MG/1.5ML SOPN, Inject 0.25 mg into the skin once a week., Disp: 2 pen, Rfl: 3 .  VYVANSE 50 MG capsule, , Disp: , Rfl:  .  traMADol (ULTRAM) 50 MG tablet, Take 1 tablet (50 mg total) by mouth every 8 (eight) hours as needed for up to 5 days., Disp: 15 tablet, Rfl: 0  EXAM:  VITALS per patient if applicable:  GENERAL: alert, oriented, appears well and in no acute distress  HEENT: atraumatic, conjunttiva clear, no obvious abnormalities on inspection of external nose and ears  NECK: normal movements of the head and neck  LUNGS: on inspection no signs of respiratory distress, breathing rate appears normal, no obvious gross SOB, gasping or wheezing  CV: no obvious cyanosis  MS: moves all visible extremities without noticeable abnormality  PSYCH/NEURO: pleasant and cooperative, no obvious depression or anxiety, speech and thought processing grossly intact  ASSESSMENT AND PLAN:  Discussed the following assessment and plan:  Uveitis, intermediate, bilateral  Uveitis, intermediate, bilateral Causing headaches, blurred vision and orbital swelling.  She has been advised to continue a  reduced work schedule from home.  She has been referred to a specialist     I discussed the assessment and treatment plan with the patient. The patient was provided an opportunity to ask questions and all were answered. The patient agreed with the plan and demonstrated an understanding of the instructions.   The patient was advised to call back or seek an in-person evaluation if the symptoms worsen or if the condition fails to improve as anticipated.  I provided  15 minutes of non-face-to-face time during this encounter.   Crecencio Mc, MD

## 2019-09-07 NOTE — Assessment & Plan Note (Addendum)
Causing headaches, blurred vision and orbital swelling.  She has been advised to continue a  reduced work schedule from home.  She has been referred to a specialist

## 2019-09-12 ENCOUNTER — Telehealth: Payer: Self-pay | Admitting: Internal Medicine

## 2019-09-12 NOTE — Telephone Encounter (Signed)
[name] with [facility] leave and disability team called and wants to know how long pt needs to rest eyes. 10-15 mins or 30? And how many breaks does pt need. Please call back @ [phone]

## 2019-09-12 NOTE — Telephone Encounter (Signed)
Stated in the disability form that she can work 2 hours,  Then rest 1 hour.

## 2019-09-13 ENCOUNTER — Other Ambulatory Visit: Payer: Self-pay

## 2019-09-13 MED ORDER — ONDANSETRON 4 MG PO TBDP: 4.0000 mg | ORAL_TABLET | Freq: Three times a day (TID) | ORAL | 0 refills | Status: DC | PRN

## 2019-09-13 MED FILL — ONDANSETRON ODT 4 MG TABLET: 4 | 7 days supply | Qty: 20 | Fill #0

## 2019-09-13 NOTE — Telephone Encounter (Signed)
Pt said she had a visit with Dr. Derrel Nip on [place] and she didn't get her Zofran called in to [facility]. She is wondering if anyone can get this called in for her today. Please call pt and let her know.

## 2019-09-13 NOTE — Telephone Encounter (Signed)
LMTCB

## 2019-09-13 NOTE — Telephone Encounter (Signed)
Spoke with Caryl Pina to let her know that Dr. Derrel Nip stated on the disability form that pt should work for two hours and then rest for an hour and repeat that for her entire shift. Caryl Pina gave a verbal understanding.

## 2019-09-17 ENCOUNTER — Ambulatory Visit (INDEPENDENT_AMBULATORY_CARE_PROVIDER_SITE_OTHER): Payer: 59 | Admitting: Endocrinology

## 2019-09-17 ENCOUNTER — Other Ambulatory Visit: Payer: Self-pay

## 2019-09-17 DIAGNOSIS — E1165 Type 2 diabetes mellitus with hyperglycemia: Secondary | ICD-10-CM | POA: Diagnosis not present

## 2019-09-17 MED ORDER — OZEMPIC (0.25 OR 0.5 MG/DOSE) 2 MG/1.5ML ~~LOC~~ SOPN: 0.5000 mg | PEN_INJECTOR | SUBCUTANEOUS | 3 refills | Status: DC

## 2019-09-17 MED FILL — OZEMPIC 0.25 OR 0.5 MG/DOSE: 2 | 28 days supply | Qty: 2 | Fill #0

## 2019-09-17 NOTE — Patient Instructions (Addendum)
In order for the V-GO-40 to work, we'll need to reduce your daily insulin need.  I have sent a prescription to your pharmacy, to increase the Ozempic.   Please reduce the Tresiba to 45 units daily, and: Please continue the same Humalog.   check your blood sugar twice a day.  vary the time of day when you check, between before the 3 meals, and at bedtime.  also check if you have symptoms of your blood sugar being too high or too low.  please keep a record of the readings and bring it to your next appointment here (or you can bring the meter itself).  You can write it on any piece of paper.  please call us sooner if your blood sugar goes below 70, or if you have a lot of readings over 200.  Please come back for a follow-up appointment in 1 month.

## 2019-09-17 NOTE — Progress Notes (Signed)
Follow-up Outpatient Visit Date: 09/18/2019  Primary Care Provider: Crecencio Mc, MD [address] [place] [address]  Chief Complaint: Eye pain and vision changes  HPI:  [name] is a 49 y.o. female with history of PSVT, hypertension, type 2 diabetes mellitus, COVID-19 infection in [id number] complicated by fatigue and uveitis, asthma, anxiety, and multiple allergies, who presents for follow-up of shortness of breath and PSVT.  I last saw [name] in early January, at which time she complained of considerable fatigue and shortness of breath.  Subsequent echo showed preserved LVEF with grade 1 diastolic dysfunction and mild left atrial enlargement.  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  She notes frequent pain in both eyes as well as worsening vision and significant photosensitivity.  She is scheduled to see a retina specialist later this week.  She denies chest pain, shortness of breath, lightheadedness, and edema.  Palpitations ceased a few weeks after our last visit in January.  --------------------------------------------------------------------------------------------------  Cardiovascular History & Procedures: Cardiovascular Problems:  Palpitations  Chest pain and shortness of breath in the setting of COVID-19  Risk Factors:  Hypertension, diabetes,obesity,and family history  Cath/PCI:  None  CV Surgery:  None  EP Procedures and Devices:  14-day event monitor (01/12/17): Predominantly sinus rhythm with rare PACs and PVCs. Brief atrial run lasting 4 beats.  Non-Invasive Evaluation(s):  TTE (06/18/2019): Normal LV size with mild LVH.  LVEF 60-65% with grade 1 diastolic dysfunction.  Normal RV size and function.  Mild left atrial enlargement.  Coronary CTA (03/23/17): No evidence of coronary artery disease. Normal coronary  origins with right dominance. Coronary artery calcium score is zero.  TTE (01/19/17): Normal LV size with moderate LVH. LVEF 60-65% with grade 1 diastolic dysfunction. Normal RV size and function. Trivial pericardial effusion.  Recent CV Pertinent Labs: Lab Results  Component Value Date   CHOL 136 04/23/2018   HDL 39 (L) 04/23/2018   LDLCALC 83 04/23/2018   LDLDIRECT 98.0 08/17/2016   TRIG 71 04/23/2018   CHOLHDL 3.5 04/23/2018   CHOLHDL 4 03/22/2017   K 4.4 06/08/2019   K 3.6 06/14/2012   MG 1.9 06/02/2019   BUN 26 (H) 06/08/2019   BUN 12 04/23/2018   BUN 17 06/14/2012   CREATININE 0.73 06/08/2019   CREATININE 0.83 06/14/2013    Past medical and surgical history were reviewed and updated in EPIC.  Current Meds  Medication Sig  . albuterol (VENTOLIN HFA) 108 (90 Base) MCG/ACT inhaler Inhale 2 puffs into the lungs every 6 (six) hours as needed for wheezing or shortness of breath.  . ALPRAZolam (XANAX) 0.5 MG tablet Take 1 tablet (0.5 mg total) by mouth 3 (three) times daily as needed (Headache).  [name] aspirin EC 81 MG tablet Take 81 mg by mouth daily.  [name] atorvastatin (LIPITOR) 20 MG tablet Take 1 tablet (20 mg total) by mouth daily at 6 PM.  . Blood Glucose Monitoring Suppl (FREESTYLE LITE) DEVI 1 each by Does not apply route 2 (two) times daily. E11.9  . butalbital-acetaminophen-caffeine (FIORICET) 50-325-40 MG tablet Take 1 tablet by mouth every 6 (six) hours as needed for headache. Do not refill in less than 30 day  . celecoxib (CELEBREX) 200 MG capsule Take 1 capsule (200 mg total) by mouth 2 (two) times daily as needed.  . diazepam (VALIUM) 2 MG tablet Take 1 tablet (2 mg  total) by mouth every 6 (six) hours as needed (Headache).  . diltiazem (CARDIZEM LA) 240 MG 24 hr tablet Take 1 tablet (240 mg total) by mouth daily.  [name] EPINEPHrine 0.3 mg/0.3 mL IJ SOAJ injection Inject 0.3 mLs (0.3 mg total) into the muscle as needed (for allergic reaction).  . Fremanezumab-vfrm (AJOVY) 225  MG/1.5ML SOAJ Inject 675 mg into the skin every 3 (three) months.  [name] glucose blood (FREESTYLE LITE) test strip 1 each by Other route 2 (two) times daily. E11.9  . hydrochlorothiazide (MICROZIDE) 12.5 MG capsule TAKE 1 CAPSULE BY MOUTH DAILY.  [name] insulin degludec (TRESIBA FLEXTOUCH) 100 UNIT/ML FlexTouch Pen Inject 0.45 mLs (45 Units total) into the skin daily.  . insulin lispro (HUMALOG KWIKPEN) 100 UNIT/ML KwikPen Inject 0.25 mLs (25 Units total) into the skin 3 (three) times daily.  . Insulin Pen Needle (PEN NEEDLES 31GX5/16") 31G X 8 MM MISC 1 each by Does not apply route in the morning, at noon, in the evening, and at bedtime. E11.9  . Lancets (FREESTYLE) lancets 1 each by Other route 2 (two) times daily. E11.9  . losartan (COZAAR) 50 MG tablet TAKE 1 TABLET BY MOUTH DAILY.  . metFORMIN (GLUCOPHAGE-XR) 500 MG 24 hr tablet Take 2 tablets (1,000 mg total) by mouth 2 (two) times daily.  . metoprolol succinate (TOPROL-XL) 25 MG 24 hr tablet Take 0.5 tablets (12.5 mg total) by mouth daily.  . ondansetron (ZOFRAN ODT) 4 MG disintegrating tablet Take 1 tablet (4 mg total) by mouth every 8 (eight) hours as needed for nausea or vomiting.  [name] oxyCODONE-acetaminophen (PERCOCET/ROXICET) 5-325 MG tablet Take by mouth as needed for severe pain.  . prednisoLONE acetate (PRED FORTE) 1 % ophthalmic suspension Place into both eyes in the morning, at noon, and at bedtime.   . Semaglutide,0.25 or 0.5MG /DOS, (OZEMPIC, 0.25 OR 0.5 MG/DOSE,) 2 MG/1.5ML SOPN Inject 0.5 mg into the skin once a week.    Allergies: Bamlanivimab, Botox [onabotulinumtoxina], Clindamycin/lincomycin, Kiwi extract, Maxalt [rizatriptan benzoate], Nitrous oxide, Triptans, Aspirin, Contrast media [iodinated diagnostic agents], Flagyl [metronidazole], Hydrocodone-acetaminophen, Latex, Mango flavor, Rizatriptan, Vicodin [hydrocodone-acetaminophen], Flu virus vaccine, Penicillins, and Tetracyclines & related  Social History   Tobacco Use  .  Smoking status: Never Smoker  . Smokeless tobacco: Never Used  Substance Use Topics  . Alcohol use: No  . Drug use: No    Family History  Problem Relation Age of Onset  . Diabetes Mother   . Heart disease Mother 22  . Hypertension Mother   . Hyperlipidemia Mother   . Heart failure Mother   . Stroke Mother   . Thyroid disease Mother   . Depression Mother   . Sleep apnea Mother   . Obesity Mother   . Cancer Father 63       Lung Cancer  . Heart disease Father   . Mental illness Sister        bipolar, substance abuse,  clean 2 yrs  . Hypertension Sister   . Cancer Paternal Grandmother 20       breast cancer  . Breast cancer Paternal Grandmother   . Diabetes Maternal Grandmother   . Hypertension Maternal Grandmother   . Diabetes Son     Review of Systems: A 12-system review of systems was performed and was negative except as noted in the HPI.  --------------------------------------------------------------------------------------------------  Physical Exam: BP 124/90 (BP Location: Left Arm, Patient Position: Sitting, Cuff Size: Large)   Pulse 88   Ht 5\' 6"  (1.676 m)  Wt 222 lb 6 oz (100.9 kg)   SpO2 97%   BMI 35.89 kg/m   General:  NAD. HEENT: Bilateral eye injection noted. Neck: No JVD or HJR. Lungs: CTA bilaterally. Heart: RRR w/o murmurs, rubs, or gallops. Ext: No LE edema.  2+ radial and pedal pulses.  EKG:  NSR without abnormality.  Lab Results  Component Value Date   WBC 7.8 07/24/2019   HGB 12.1 07/24/2019   HCT 37.7 07/24/2019   MCV 90.2 07/24/2019   PLT 193 07/24/2019    Lab Results  Component Value Date   NA 134 (L) 06/08/2019   K 4.4 06/08/2019   CL 100 06/08/2019   CO2 21 (L) 06/08/2019   BUN 26 (H) 06/08/2019   CREATININE 0.73 06/08/2019   GLUCOSE 333 (H) 06/08/2019   ALT 23 06/02/2019    Lab Results  Component Value Date   CHOL 136 04/23/2018   HDL 39 (L) 04/23/2018   LDLCALC 83 04/23/2018   LDLDIRECT 98.0 08/17/2016   TRIG  71 04/23/2018   CHOLHDL 3.5 04/23/2018    --------------------------------------------------------------------------------------------------  ASSESSMENT AND PLAN: Palpitations: No significant palpitations noted since recovering from COVID-19 after our last visit.  Continue current doses of diltiazem and metoprolol.  Shortness of breath: This has resolved since our last visit.  Recent echo was reassuring with normal LVEF and grade 1 diastolic dysfunction.  No further workup or intervention is recommended at this time.  Hypertension: Diastolic blood pressure mildly elevated today.  However, in the setting of ongoing pain due to iritis/uveitis, we have agreed to defer medication changes.  Follow-up: Return to clinic in 1 year.  Nelva Bush, MD 09/18/2019 9:30 AM

## 2019-09-17 NOTE — Progress Notes (Signed)
   Subjective:    Patient ID: [name], female    DOB: [birth year], 49 y.o.   MRN: [id number]  HPI  telehealth visit today via phone x 10 minutes.  Alternatives to telehealth are presented to this patient, and the patient agrees to the telehealth visit. Pt is advised of the cost of the visit, and agrees to this, also.   Patient is at home, and I am at home.   Persons attending the telehealth visit: the patient and I Pt returns for f/u of diabetes mellitus: DM type: Insulin-requiring type 2 Dx'ed: 0000000 Complications: DN Therapy: insulin since 2018, and metformin.   GDM: 1990 and 2004 DKA: never Severe hypoglycemia: never Pancreatitis: never Pancreatic imaging: normal on 2019 CT.  SDOH: none Other: She did not tolerate Jardiance (vaginitis) or Victoza (dysphagia); she takes multiple daily injections (but she takes humalog BID, as she eats 2 meals per day); she works 1st shift.   Interval history: no recent steroids.  Pt says fasting cbg varies from 90-117.  It is in general higher as the day goes on. She wants to take V-GO.     Review of Systems Nausea is improved.      Objective:   Physical Exam    Lab Results  Component Value Date   HGBA1C 7.3 (A) 08/15/2019   Lab Results  Component Value Date   CREATININE 0.73 06/08/2019   BUN 26 (H) 06/08/2019   NA 134 (L) 06/08/2019   K 4.4 06/08/2019   CL 100 06/08/2019   CO2 21 (L) 06/08/2019       Assessment & Plan:  Type 2 DM: glycemic control is improved, but she needs to reduce insulin dosage to allow V-GO  Patient Instructions  In order for the V-GO-40 to work, we'll need to reduce your daily insulin need.  I have sent a prescription to your pharmacy, to increase the Ozempic.   Please reduce the Tresiba to 45 units daily, and: Please continue the same Humalog.   check your blood sugar twice a day.  vary the time of day when you check, between before the 3 meals, and at bedtime.  also check if you have symptoms  of your blood sugar being too high or too low.  please keep a record of the readings and bring it to your next appointment here (or you can bring the meter itself).  You can write it on any piece of paper.  please call us sooner if your blood sugar goes below 70, or if you have a lot of readings over 200.  Please come back for a follow-up appointment in 1 month.

## 2019-09-18 ENCOUNTER — Ambulatory Visit (INDEPENDENT_AMBULATORY_CARE_PROVIDER_SITE_OTHER): Payer: 59 | Admitting: Internal Medicine

## 2019-09-18 ENCOUNTER — Encounter: Payer: Self-pay | Admitting: Internal Medicine

## 2019-09-18 ENCOUNTER — Other Ambulatory Visit: Payer: Self-pay

## 2019-09-18 VITALS — BP 124/90 | HR 88 | Ht 66.0 in | Wt 222.4 lb

## 2019-09-18 DIAGNOSIS — R0602 Shortness of breath: Secondary | ICD-10-CM

## 2019-09-18 DIAGNOSIS — I10 Essential (primary) hypertension: Secondary | ICD-10-CM | POA: Diagnosis not present

## 2019-09-18 DIAGNOSIS — R002 Palpitations: Secondary | ICD-10-CM | POA: Diagnosis not present

## 2019-09-18 MED FILL — METOPROLOL SUCCINATE ER 25: 25 | 90 days supply | Qty: 45 | Fill #2

## 2019-09-18 NOTE — Patient Instructions (Signed)
Medication Instructions:  Continue current medications  *If you need a refill on your cardiac medications before your next appointment, please call your pharmacy*   Lab Work: None   Testing/Procedures: None   Follow-Up: At [facility], you and your health needs are our priority.  As part of our continuing mission to provide you with exceptional heart care, we have created designated Provider Care Teams.  These Care Teams include your primary Cardiologist (physician) and Advanced Practice Providers (APPs -  Physician Assistants and Nurse Practitioners) who all work together to provide you with the care you need, when you need it.  We recommend signing up for the patient portal called "MyChart".  Sign up information is provided on this After Visit Summary.  MyChart is used to connect with patients for Virtual Visits (Telemedicine).  Patients are able to view lab/test results, encounter notes, upcoming appointments, etc.  Non-urgent messages can be sent to your provider as well.   To learn more about what you can do with MyChart, go to [email or web address].    Your next appointment:   1 year(s)  The format for your next appointment:   Either In Person or Virtual  Provider:    You may see Nelva Bush, MD or one of the following Advanced Practice Providers on your designated Care Team:    Murray Hodgkins, NP  Christell Faith, PA-C  Marrianne Mood, PA-C

## 2019-09-19 MED FILL — AJOVY 225 MG/1.5ML SOAJ: 225 | 90 days supply | Qty: 5 | Fill #1

## 2019-09-19 MED FILL — KETOROLAC 0.4% OPHTH SOLN: 0.4 | 50 days supply | Qty: 5 | Fill #0

## 2019-09-23 DIAGNOSIS — E119 Type 2 diabetes mellitus without complications: Secondary | ICD-10-CM | POA: Diagnosis not present

## 2019-09-23 MED FILL — CYCLOPENTOLATE 1% EYE DROPS: 1 | 10 days supply | Qty: 2 | Fill #0

## 2019-09-26 ENCOUNTER — Encounter: Payer: Self-pay | Admitting: *Deleted

## 2019-10-01 MED FILL — TRESIBA FLEXTOUCH 100 UNITS: 100 | 29 days supply | Qty: 18 | Fill #1

## 2019-10-01 MED FILL — UNIFINE PENTIPS 32GX5/32: 32G X 4 MM | 25 days supply | Qty: 100 | Fill #1

## 2019-10-01 MED FILL — HUMALOG 100 UNITS/ML KWIKPE: 100 | 28 days supply | Qty: 21 | Fill #1

## 2019-10-14 DIAGNOSIS — H2000 Unspecified acute and subacute iridocyclitis: Secondary | ICD-10-CM | POA: Diagnosis not present

## 2019-10-22 ENCOUNTER — Other Ambulatory Visit: Payer: Self-pay | Admitting: Family Medicine

## 2019-10-22 ENCOUNTER — Ambulatory Visit
Admission: RE | Admit: 2019-10-22 | Discharge: 2019-10-22 | Disposition: A | Discharge status: Home / Self Care | Payer: PRIVATE HEALTH INSURANCE | Source: Ambulatory Visit | Attending: Family Medicine | Admitting: Family Medicine

## 2019-10-22 DIAGNOSIS — M25571 Pain in right ankle and joints of right foot: Secondary | ICD-10-CM

## 2019-10-24 ENCOUNTER — Other Ambulatory Visit (HOSPITAL_COMMUNITY): Payer: Self-pay | Admitting: Endocrinology

## 2019-10-24 ENCOUNTER — Telehealth (INDEPENDENT_AMBULATORY_CARE_PROVIDER_SITE_OTHER): Payer: 59 | Admitting: Endocrinology

## 2019-10-24 ENCOUNTER — Other Ambulatory Visit: Payer: Self-pay

## 2019-10-24 DIAGNOSIS — Z794 Long term (current) use of insulin: Secondary | ICD-10-CM

## 2019-10-24 DIAGNOSIS — E1165 Type 2 diabetes mellitus with hyperglycemia: Secondary | ICD-10-CM

## 2019-10-24 DIAGNOSIS — E119 Type 2 diabetes mellitus without complications: Secondary | ICD-10-CM | POA: Diagnosis not present

## 2019-10-24 MED ORDER — METFORMIN HCL ER 500 MG PO TB24: 1000.0000 mg | ORAL_TABLET | Freq: Two times a day (BID) | ORAL | 2 refills | Status: DC

## 2019-10-24 MED ORDER — TRESIBA FLEXTOUCH 100 UNIT/ML ~~LOC~~ SOPN: 35.0000 [IU] | PEN_INJECTOR | Freq: Every day | SUBCUTANEOUS | 0 refills | Status: DC

## 2019-10-24 MED FILL — METFORMIN HCL ER 500 MG TB2: 500 | 90 days supply | Qty: 360 | Fill #0

## 2019-10-24 NOTE — Progress Notes (Signed)
Subjective:    Patient ID: [name], female    DOB: 27-May-1971, 49 y.o.   MRN: [id number]  HPI telehealth visit today via doxy video visit.  Alternatives to telehealth are presented to this patient, and the patient agrees to the telehealth visit. Pt is advised of the cost of the visit, and agrees to this, also.   Patient is at home, and I am at the office.   Persons attending the telehealth visit: the patient and I  Pt returns for f/u of diabetes mellitus: DM type: Insulin-requiring type 2 Dx'ed: 0000000 Complications: DN Therapy: insulin since 2018, Ozempic, and metformin.   GDM: 1990 and 2004 DKA: never Severe hypoglycemia: never Pancreatitis: never Pancreatic imaging: normal on 2019 CT.  SDOH: none Other: She did not tolerate Jardiance (vaginitis) or Victoza (dysphagia); she takes multiple daily injections (but she takes humalog BID, as she eats 2 meals per day); she works 1st shift.   Interval history: no recent steroids.  Pt says fasting cbg's are in the low-100's.  It is in general higher as the day goes on. She wants to take V-GO.  Past Medical History:  Diagnosis Date  . Allergy   . Anemia   . Anxiety    claustrophobic  . Asthma   . Back pain   . Biceps tendonosis of right shoulder   . Diabetes mellitus 2011   did not start metforfin, losing weight  . Dyspnea   . Fatty liver   . Food allergy   . Headache disorder   . History of concussion   . HLD (hyperlipidemia)   . Hypertension   . IBS (irritable bowel syndrome)   . Infertility, female   . Joint pain   . Lactose intolerance   . Leg edema   . Migraines   . Other specified disorders of thyroid   . Palpitation   . Post-menopausal   . Seborrheic dermatitis   . Shoulder impingement syndrome, right   . Vitamin D deficiency     Past Surgical History:  Procedure Laterality Date  . ABDOMINAL HYSTERECTOMY  2006   heavy menses, endometriosis, l oophrectomy  . BACK SURGERY    . BREAST BIOPSY Right 2018    benign  . BREAST EXCISIONAL BIOPSY Right 2003   benign  . BREAST EXCISIONAL BIOPSY Right 1999   benign  . BREAST SURGERY     right breast x 2 , benign  . HERNIA REPAIR  2003   left inguinal   . LEFT OOPHORECTOMY    . SHOULDER ARTHROSCOPY WITH SUBACROMIAL DECOMPRESSION AND OPEN ROTATOR C Right 07/14/2016   Procedure: RIGHT SHOULDER ARTHROSCOPY WITH SUBACROMIAL DECOMPRESSION, DISTAL CLAVICLE RESECTION AND MINI OPEN ROTATOR CUFF REPAIR, OPEN BICEP TENDODESIS;  Surgeon: Garald Balding, MD;  Location: [facility];  Service: Orthopedics;  Laterality: Right;  . SHOULDER CLOSED REDUCTION Right 09/08/2016   Procedure: RIGHT CLOSED MANIPULATION SHOULDER;  Surgeon: Garald Balding, MD;  Location: [facility];  Service: Orthopedics;  Laterality: Right;  . TUBAL LIGATION      Social History   Socioeconomic History  . Marital status: Divorced    Spouse name: Not on file  . Number of children: 2  . Years of education: 37  . Highest education level: Master's degree (e.g., MA, MS, MEng, MEd, MSW, MBA)  Occupational History  . 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 Smoking status: Never Smoker  .  Smokeless tobacco: Never Used  Substance and Sexual Activity  . Alcohol use: No  . Drug use: No  . Sexual activity: Not Currently    Partners: Male    Birth control/protection: Surgical  Other Topics Concern  . Not on file  Social History Narrative   Lives at home with son and daughter.   Right-handed.   1-3 cups caffeine weekly.   Social Determinants of Health   Financial Resource Strain:   . Difficulty of Paying Living Expenses:   Food Insecurity:   . Worried About Charity fundraiser in the Last Year:   . Arboriculturist in the Last Year:   Transportation Needs:   . Film/video editor (Medical):   [name] Lack of Transportation (Non-Medical):   Physical Activity:   . Days of Exercise per Week:   . Minutes of Exercise per  Session:   Stress:   . Feeling of Stress :   Social Connections:   . Frequency of Communication with Friends and Family:   . Frequency of Social Gatherings with Friends and Family:   . Attends Religious Services:   . Active Member of Clubs or Organizations:   . Attends Archivist Meetings:   [name] Marital Status:   Intimate Partner Violence:   . Fear of Current or Ex-Partner:   . Emotionally Abused:   [name] Physically Abused:   . Sexually Abused:     Current Outpatient Medications on File Prior to Visit  Medication Sig Dispense Refill  . albuterol (VENTOLIN HFA) 108 (90 Base) MCG/ACT inhaler Inhale 2 puffs into the lungs every 6 (six) hours as needed for wheezing or shortness of breath. 18 g 0  . ALPRAZolam (XANAX) 0.5 MG tablet Take 1 tablet (0.5 mg total) by mouth 3 (three) times daily as needed (Headache).    [name] aspirin EC 81 MG tablet Take 81 mg by mouth daily.    [name] atorvastatin (LIPITOR) 20 MG tablet Take 1 tablet (20 mg total) by mouth daily at 6 PM.    . benzonatate (TESSALON) 100 MG capsule Take 1 capsule (100 mg total) by mouth 3 (three) times daily as needed for cough. 21 capsule 0  . Blood Glucose Monitoring Suppl (FREESTYLE LITE) DEVI 1 each by Does not apply route 2 (two) times daily. E11.9 1 each 0  . butalbital-acetaminophen-caffeine (FIORICET) 50-325-40 MG tablet Take 1 tablet by mouth every 6 (six) hours as needed for headache. Do not refill in less than 30 day 30 tablet 1  . celecoxib (CELEBREX) 200 MG capsule Take 1 capsule (200 mg total) by mouth 2 (two) times daily as needed. 45 capsule 0  . diazepam (VALIUM) 2 MG tablet Take 1 tablet (2 mg total) by mouth every 6 (six) hours as needed (Headache). 30 tablet 5  . diltiazem (CARDIZEM LA) 240 MG 24 hr tablet Take 1 tablet (240 mg total) by mouth daily.    [name] EPINEPHrine 0.3 mg/0.3 mL IJ SOAJ injection Inject 0.3 mLs (0.3 mg total) into the muscle as needed (for allergic reaction). 1 each 3  . ergocalciferol (DRISDOL)  1.25 MG (50000 UT) capsule Take 1 capsule (50,000 Units total) by mouth once a week. 12 capsule 0  . Fremanezumab-vfrm (AJOVY) 225 MG/1.5ML SOAJ Inject 675 mg into the skin every 3 (three) months. 3 pen 3  . glucose blood (FREESTYLE LITE) test strip 1 each by Other route 2 (two) times daily. E11.9 200 each 0  . hydrochlorothiazide (MICROZIDE) 12.5 MG  capsule TAKE 1 CAPSULE BY MOUTH DAILY. 90 capsule 0  . insulin lispro (HUMALOG KWIKPEN) 100 UNIT/ML KwikPen Inject 0.25 mLs (25 Units total) into the skin 3 (three) times daily. 67.5 mL 0  . Insulin Pen Needle (PEN NEEDLES 31GX5/16") 31G X 8 MM MISC 1 each by Does not apply route in the morning, at noon, in the evening, and at bedtime. E11.9 360 each 0  . ISOPTO ATROPINE 1 % ophthalmic solution     . ketorolac (TORADOL) 10 MG tablet Take 1 tablet (10 mg total) by mouth every 6 (six) hours as needed. (Patient taking differently: Take 10 mg by mouth every 6 (six) hours as needed (Headache). ) 30 tablet 1  . Lancets (FREESTYLE) lancets 1 each by Other route 2 (two) times daily. E11.9 200 each 0  . losartan (COZAAR) 50 MG tablet TAKE 1 TABLET BY MOUTH DAILY. 90 tablet 0  . metoprolol succinate (TOPROL-XL) 25 MG 24 hr tablet Take 0.5 tablets (12.5 mg total) by mouth daily. 45 tablet 3  . ondansetron (ZOFRAN ODT) 4 MG disintegrating tablet Take 1 tablet (4 mg total) by mouth every 8 (eight) hours as needed for nausea or vomiting. 20 tablet 0  . oxyCODONE-acetaminophen (PERCOCET/ROXICET) 5-325 MG tablet Take by mouth as needed for severe pain.    . prednisoLONE acetate (PRED FORTE) 1 % ophthalmic suspension Place into both eyes in the morning, at noon, and at bedtime.     . Semaglutide,0.25 or 0.5MG /DOS, (OZEMPIC, 0.25 OR 0.5 MG/DOSE,) 2 MG/1.5ML SOPN Inject 0.5 mg into the skin once a week. 2 pen 3  . VYVANSE 50 MG capsule      No current facility-administered medications on file prior to visit.    Allergies  Allergen Reactions  . Bamlanivimab  Anaphylaxis, Itching, Palpitations, Rash, Shortness Of Breath and Swelling  . Botox [Onabotulinumtoxina] Shortness Of Breath    syncope  . Clindamycin/Lincomycin Other (See Comments)  . Kiwi Extract Shortness Of Breath  . Maxalt [Rizatriptan Benzoate] Anaphylaxis    Chest pain  . Nitrous Oxide Shortness Of Breath    syncope  . Triptans Shortness Of Breath  . Aspirin Nausea And Vomiting    Mouth blisters  . Contrast Media [Iodinated Diagnostic Agents]   . Flagyl [Metronidazole]   . Hydrocodone-Acetaminophen Other (See Comments)  . Latex     Sometimes causes rash  . Mango Flavor   . Rizatriptan Other (See Comments)  . Vicodin [Hydrocodone-Acetaminophen]     hallucinations  . Flu Virus Vaccine Palpitations  . Penicillins Nausea And Vomiting, Rash and Other (See Comments)    Did it involve swelling of the face/tongue/throat, SOB, or low BP? No Did it involve sudden or severe rash/hives, skin peeling, or any reaction on the inside of your mouth or nose? No Did you need to seek medical attention at a hospital or doctor's office? Yes When did it last happen?patient was 49 years old If all above answers are "NO", may proceed with cephalosporin use.  . Tetracyclines & Related Nausea And Vomiting and Rash    Family History  Problem Relation Age of Onset  . Diabetes Mother   . Heart disease Mother 64  . Hypertension Mother   . Hyperlipidemia Mother   . Heart failure Mother   . Stroke Mother   . Thyroid disease Mother   . Depression Mother   . Sleep apnea Mother   . Obesity Mother   . Cancer Father 59       Lung  Cancer  . Heart disease Father   . Mental illness Sister        bipolar, substance abuse,  clean 2 yrs  . Hypertension Sister   . Cancer Paternal Grandmother 69       breast cancer  . Breast cancer Paternal Grandmother   . Diabetes Maternal Grandmother   . Hypertension Maternal Grandmother   . Diabetes Son     There were no vitals taken for this  visit.   Review of Systems She denies hypoglycemia.  She has lost a few lbs.  Nausea is mild.      Objective:   Physical Exam       Assessment & Plan:  Insulin-requiring type 2 DM: apparently well-controlled Nausea: side effect of Ozempic.  We discussed.  Pt chooses to continue.  Please continue the same medications  Patient Instructions  In order for the V-GO-40 to work, we'll need to reduce your daily insulin need.   Please reduce the Tresiba to 35 units daily, and Please continue the same Humalog.   check your blood sugar twice a day.  vary the time of day when you check, between before the 3 meals, and at bedtime.  also check if you have symptoms of your blood sugar being too high or too low.  please keep a record of the readings and bring it to your next appointment here (or you can bring the meter itself).  You can write it on any piece of paper.  please call us sooner if your blood sugar goes below 70, or if you have a lot of readings over 200.  Please come back for a follow-up appointment in approx 5 weeks.

## 2019-10-24 NOTE — Patient Instructions (Addendum)
In order for the V-GO-40 to work, we'll need to reduce your daily insulin need.   Please reduce the Tresiba to 35 units daily, and Please continue the same Humalog.   check your blood sugar twice a day.  vary the time of day when you check, between before the 3 meals, and at bedtime.  also check if you have symptoms of your blood sugar being too high or too low.  please keep a record of the readings and bring it to your next appointment here (or you can bring the meter itself).  You can write it on any piece of paper.  please call us sooner if your blood sugar goes below 70, or if you have a lot of readings over 200.  Please come back for a follow-up appointment in approx 5 weeks.

## 2019-10-24 NOTE — Progress Notes (Signed)
Success Icon @ 3:27pm Invitation Sent Your invitation to [phone] has been sent. This invitation will expire in 30 minutes.  OK

## 2019-10-25 ENCOUNTER — Other Ambulatory Visit: Payer: Self-pay | Admitting: Internal Medicine

## 2019-11-01 ENCOUNTER — Other Ambulatory Visit (HOSPITAL_COMMUNITY): Payer: Self-pay | Admitting: Internal Medicine

## 2019-11-01 ENCOUNTER — Other Ambulatory Visit: Payer: Self-pay | Admitting: Internal Medicine

## 2019-11-01 MED ORDER — LOSARTAN POTASSIUM-HCTZ 50-12.5 MG PO TABS
1.0000 | ORAL_TABLET | Freq: Every day | ORAL | 3 refills | Status: DC
Start: 2019-11-01 — End: 2020-10-13

## 2019-11-01 MED ORDER — LOSARTAN POTASSIUM-HCTZ 50-12.5 MG PO TABS
1.0000 | ORAL_TABLET | Freq: Every day | ORAL | 3 refills | Status: DC
Start: 2019-11-01 — End: 2019-11-01

## 2019-11-01 NOTE — Telephone Encounter (Signed)
Spoke to pharmacy. Patient is wanting the combo pill for losartan and HCTZ like in the past. Pharmacy needs a new order placed.

## 2019-11-01 NOTE — Telephone Encounter (Signed)
br

## 2019-11-01 NOTE — Addendum Note (Signed)
Addended by: Crecencio Mc on: 11/01/2019 11:54 AM   Modules accepted: Orders

## 2019-11-01 NOTE — Telephone Encounter (Signed)
RX SENT TO [facility] FOR LOSARTAN/HCT COMBO

## 2019-11-01 NOTE — Telephone Encounter (Signed)
[facility] pharmacy called and said they need a verbal on losartan prescription. Would like a call back so patient can get prescription today.

## 2019-11-18 ENCOUNTER — Ambulatory Visit: Payer: PRIVATE HEALTH INSURANCE | Admitting: Physical Therapy

## 2019-11-19 ENCOUNTER — Ambulatory Visit: Payer: PRIVATE HEALTH INSURANCE

## 2019-11-19 DIAGNOSIS — H2000 Unspecified acute and subacute iridocyclitis: Secondary | ICD-10-CM | POA: Diagnosis not present

## 2019-11-20 ENCOUNTER — Ambulatory Visit: Payer: PRIVATE HEALTH INSURANCE | Admitting: Physical Therapy

## 2019-11-25 ENCOUNTER — Ambulatory Visit: Payer: PRIVATE HEALTH INSURANCE | Admitting: Physical Therapy

## 2019-11-27 ENCOUNTER — Encounter: Payer: 59 | Admitting: Physical Therapy

## 2019-11-27 MED FILL — TRESIBA FLEXTOUCH 100 UNITS: 100 | 29 days supply | Qty: 18 | Fill #2

## 2019-11-27 MED FILL — PREDNISOLONE AC 1% EYE DROP: 1 | 13 days supply | Qty: 5 | Fill #0

## 2019-12-02 ENCOUNTER — Other Ambulatory Visit: Payer: Self-pay

## 2019-12-02 ENCOUNTER — Encounter: Payer: Self-pay | Admitting: Endocrinology

## 2019-12-02 ENCOUNTER — Other Ambulatory Visit (HOSPITAL_COMMUNITY): Payer: Self-pay | Admitting: Endocrinology

## 2019-12-02 ENCOUNTER — Ambulatory Visit: Payer: 59 | Admitting: Endocrinology

## 2019-12-02 VITALS — BP 104/72 | HR 89 | Ht 66.0 in | Wt 221.6 lb

## 2019-12-02 DIAGNOSIS — E1165 Type 2 diabetes mellitus with hyperglycemia: Secondary | ICD-10-CM | POA: Diagnosis not present

## 2019-12-02 LAB — POCT GLYCOSYLATED HEMOGLOBIN (HGB A1C): Hemoglobin A1C: 6.6 % — AB (ref 4.0–5.6)

## 2019-12-02 MED ORDER — TRESIBA FLEXTOUCH 100 UNIT/ML ~~LOC~~ SOPN: 25.0000 [IU] | PEN_INJECTOR | Freq: Every day | SUBCUTANEOUS | 0 refills | Status: DC

## 2019-12-02 MED ORDER — INSULIN LISPRO (1 UNIT DIAL) 100 UNIT/ML (KWIKPEN): 20.0000 [IU] | PEN_INJECTOR | Freq: Two times a day (BID) | SUBCUTANEOUS | 0 refills | Status: DC

## 2019-12-02 MED ORDER — OZEMPIC (1 MG/DOSE) 2 MG/1.5ML ~~LOC~~ SOPN: 1.0000 mg | PEN_INJECTOR | SUBCUTANEOUS | 3 refills | Status: DC

## 2019-12-02 MED ORDER — INSULIN LISPRO (1 UNIT DIAL) 100 UNIT/ML (KWIKPEN): 25.0000 [IU] | PEN_INJECTOR | Freq: Two times a day (BID) | SUBCUTANEOUS | 0 refills | Status: DC

## 2019-12-02 NOTE — Progress Notes (Signed)
Subjective:    Patient ID: [name], female    DOB: [birth year], 49 y.o.   MRN: [id number]  HPI Pt returns for f/u of diabetes mellitus: DM type: Insulin-requiring type 2 Dx'ed: 2542 Complications: DN Therapy: insulin since 2018, Ozempic, and metformin.   GDM: 1990 and 2004 DKA: never Severe hypoglycemia: never Pancreatitis: never Pancreatic imaging: normal on 2019 CT.  SDOH: none Other: She did not tolerate Jardiance (vaginitis) or Victoza (dysphagia); she takes multiple daily injections (but she takes humalog BID, as she eats 2 meals per day); she works 1st shift.   Interval history: no recent steroids.  Pt says fasting fasting cbg's vary from 95-143.  She wants to take V-GO.  Pt says she eats just 2 meals/day (and therefore takes 2 humalog doses per day).   Past Medical History:  Diagnosis Date  . Allergy   . Anemia   . Anxiety    claustrophobic  . Asthma   . Back pain   . Biceps tendonosis of right shoulder   . Diabetes mellitus 2011   did not start metforfin, losing weight  . Dyspnea   . Fatty liver   . Food allergy   . Headache disorder   . History of concussion   . HLD (hyperlipidemia)   . Hypertension   . IBS (irritable bowel syndrome)   . Infertility, female   . Joint pain   . Lactose intolerance   . Leg edema   . Migraines   . Other specified disorders of thyroid   . Palpitation   . Post-menopausal   . Seborrheic dermatitis   . Shoulder impingement syndrome, right   . Vitamin D deficiency     Past Surgical History:  Procedure Laterality Date  . ABDOMINAL HYSTERECTOMY  2006   heavy menses, endometriosis, l oophrectomy  . BACK SURGERY    . BREAST BIOPSY Right 2018   benign  . BREAST EXCISIONAL BIOPSY Right 2003   benign  . BREAST EXCISIONAL BIOPSY Right 1999   benign  . BREAST SURGERY     right breast x 2 , benign  . HERNIA REPAIR  2003   left inguinal   . LEFT OOPHORECTOMY    . SHOULDER ARTHROSCOPY WITH SUBACROMIAL DECOMPRESSION AND  OPEN ROTATOR C Right 07/14/2016   Procedure: RIGHT SHOULDER ARTHROSCOPY WITH SUBACROMIAL DECOMPRESSION, DISTAL CLAVICLE RESECTION AND MINI OPEN ROTATOR CUFF REPAIR, OPEN BICEP TENDODESIS;  Surgeon: Garald Balding, MD;  Location: [facility];  Service: Orthopedics;  Laterality: Right;  . SHOULDER CLOSED REDUCTION Right 09/08/2016   Procedure: RIGHT CLOSED MANIPULATION SHOULDER;  Surgeon: Garald Balding, MD;  Location: [facility];  Service: Orthopedics;  Laterality: Right;  . TUBAL LIGATION      Social History   Socioeconomic History  . Marital status: Divorced    Spouse name: Not on file  . Number of children: 2  . Years of education: 71  . Highest education level: Master's degree (e.g., MA, MS, MEng, MEd, MSW, MBA)  Occupational History  . 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 Smoking status: Never Smoker  . Smokeless tobacco: Never Used  Vaping Use  . Vaping Use: Never used  Substance and Sexual Activity  . Alcohol use: No  . Drug use: No  . Sexual activity: Not Currently    Partners: Male    Birth control/protection: Surgical  Other Topics Concern  . Not on file  Social History Narrative   Lives at home with son and daughter.   Right-handed.   1-3 cups caffeine weekly.   Social Determinants of Health   Financial Resource Strain:   . Difficulty of Paying Living Expenses:   Food Insecurity:   . Worried About Charity fundraiser in the Last Year:   . Arboriculturist in the Last Year:   Transportation Needs:   . Film/video editor (Medical):   [name] Lack of Transportation (Non-Medical):   Physical Activity:   . Days of Exercise per Week:   . Minutes of Exercise per Session:   Stress:   . Feeling of Stress :   Social Connections:   . Frequency of Communication with Friends and Family:   . Frequency of Social Gatherings with Friends and Family:   . Attends Religious Services:   . Active Member of Clubs or  Organizations:   . Attends Archivist Meetings:   [name] Marital Status:   Intimate Partner Violence:   . Fear of Current or Ex-Partner:   . Emotionally Abused:   [name] Physically Abused:   . Sexually Abused:     Current Outpatient Medications on File Prior to Visit  Medication Sig Dispense Refill  . albuterol (VENTOLIN HFA) 108 (90 Base) MCG/ACT inhaler Inhale 2 puffs into the lungs every 6 (six) hours as needed for wheezing or shortness of breath. 18 g 0  . ALPRAZolam (XANAX) 0.5 MG tablet Take 1 tablet (0.5 mg total) by mouth 3 (three) times daily as needed (Headache).    [name] aspirin EC 81 MG tablet Take 81 mg by mouth daily.    [name] atorvastatin (LIPITOR) 20 MG tablet Take 1 tablet (20 mg total) by mouth daily at 6 PM.    . benzonatate (TESSALON) 100 MG capsule Take 1 capsule (100 mg total) by mouth 3 (three) times daily as needed for cough. 21 capsule 0  . Blood Glucose Monitoring Suppl (FREESTYLE LITE) DEVI 1 each by Does not apply route 2 (two) times daily. E11.9 1 each 0  . butalbital-acetaminophen-caffeine (FIORICET) 50-325-40 MG tablet Take 1 tablet by mouth every 6 (six) hours as needed for headache. Do not refill in less than 30 day 30 tablet 1  . celecoxib (CELEBREX) 200 MG capsule Take 1 capsule (200 mg total) by mouth 2 (two) times daily as needed. 45 capsule 0  . diazepam (VALIUM) 2 MG tablet Take 1 tablet (2 mg total) by mouth every 6 (six) hours as needed (Headache). 30 tablet 5  . diltiazem (CARDIZEM LA) 240 MG 24 hr tablet Take 1 tablet (240 mg total) by mouth daily.    [name] EPINEPHrine 0.3 mg/0.3 mL IJ SOAJ injection Inject 0.3 mLs (0.3 mg total) into the muscle as needed (for allergic reaction). 1 each 3  . ergocalciferol (DRISDOL) 1.25 MG (50000 UT) capsule Take 1 capsule (50,000 Units total) by mouth once a week. 12 capsule 0  . Fremanezumab-vfrm (AJOVY) 225 MG/1.5ML SOAJ Inject 675 mg into the skin every 3 (three) months. 3 pen 3  . glucose blood (FREESTYLE LITE) test strip  1 each by Other route 2 (two) times daily. E11.9 200 each 0  . hydrochlorothiazide (MICROZIDE) 12.5 MG capsule TAKE 1 CAPSULE BY MOUTH DAILY. 90 capsule 0  . Insulin Pen Needle (PEN NEEDLES 31GX5/16") 31G X 8 MM MISC 1 each by Does not apply route in the morning, at noon, in the evening, and at bedtime. E11.9 360 each 0  .  ISOPTO ATROPINE 1 % ophthalmic solution     . ketorolac (TORADOL) 10 MG tablet Take 1 tablet (10 mg total) by mouth every 6 (six) hours as needed. (Patient taking differently: Take 10 mg by mouth every 6 (six) hours as needed (Headache). ) 30 tablet 1  . Lancets (FREESTYLE) lancets 1 each by Other route 2 (two) times daily. E11.9 200 each 0  . losartan-hydrochlorothiazide (HYZAAR) 50-12.5 MG tablet Take 1 tablet by mouth daily. 90 tablet 3  . metFORMIN (GLUCOPHAGE-XR) 500 MG 24 hr tablet Take 2 tablets (1,000 mg total) by mouth 2 (two) times daily. 360 tablet 2  . metoprolol succinate (TOPROL-XL) 25 MG 24 hr tablet Take 0.5 tablets (12.5 mg total) by mouth daily. 45 tablet 3  . ondansetron (ZOFRAN ODT) 4 MG disintegrating tablet Take 1 tablet (4 mg total) by mouth every 8 (eight) hours as needed for nausea or vomiting. 20 tablet 0  . oxyCODONE-acetaminophen (PERCOCET/ROXICET) 5-325 MG tablet Take by mouth as needed for severe pain.    . prednisoLONE acetate (PRED FORTE) 1 % ophthalmic suspension Place into both eyes in the morning, at noon, and at bedtime.      No current facility-administered medications on file prior to visit.    Allergies  Allergen Reactions  . Bamlanivimab Anaphylaxis, Itching, Palpitations, Rash, Shortness Of Breath and Swelling  . Botox [Onabotulinumtoxina] Shortness Of Breath    syncope  . Clindamycin/Lincomycin Other (See Comments)  . Kiwi Extract Shortness Of Breath  . Maxalt [Rizatriptan Benzoate] Anaphylaxis    Chest pain  . Nitrous Oxide Shortness Of Breath    syncope  . Triptans Shortness Of Breath  . Aspirin Nausea And Vomiting    Mouth  blisters  . Contrast Media [Iodinated Diagnostic Agents]   . Flagyl [Metronidazole]   . Hydrocodone-Acetaminophen Other (See Comments)  . Latex     Sometimes causes rash  . Mango Flavor   . Rizatriptan Other (See Comments)  . Vicodin [Hydrocodone-Acetaminophen]     hallucinations  . Flu Virus Vaccine Palpitations  . Penicillins Nausea And Vomiting, Rash and Other (See Comments)    Did it involve swelling of the face/tongue/throat, SOB, or low BP? No Did it involve sudden or severe rash/hives, skin peeling, or any reaction on the inside of your mouth or nose? No Did you need to seek medical attention at a hospital or doctor's office? Yes When did it last happen?patient was 49 years old If all above answers are "NO", may proceed with cephalosporin use.  . Tetracyclines & Related Nausea And Vomiting and Rash    Family History  Problem Relation Age of Onset  . Diabetes Mother   . Heart disease Mother 84  . Hypertension Mother   . Hyperlipidemia Mother   . Heart failure Mother   . Stroke Mother   . Thyroid disease Mother   . Depression Mother   . Sleep apnea Mother   . Obesity Mother   . Cancer Father 57       Lung Cancer  . Heart disease Father   . Mental illness Sister        bipolar, substance abuse,  clean 2 yrs  . Hypertension Sister   . Cancer Paternal Grandmother 43       breast cancer  . Breast cancer Paternal Grandmother   . Diabetes Maternal Grandmother   . Hypertension Maternal Grandmother   . Diabetes Son     BP 104/72   Pulse 89   Ht 5'  6" (1.676 m)   Wt 221 lb 9.6 oz (100.5 kg)   SpO2 98%   BMI 35.77 kg/m    Review of Systems She denies hypoglycemia and nausea.     Objective:   Physical Exam VITAL SIGNS:  See vs page GENERAL: no distress Pulses: dorsalis pedis intact bilat.   MSK: no deformity of the feet CV: no leg edema Skin:  no ulcer on the feet.  normal color and temp on the feet. Neuro: sensation is intact to touch on the  feet    Lab Results  Component Value Date   HGBA1C 6.6 (A) 12/02/2019       Assessment & Plan:  Insulin-requiring type 2 DM, with DN: well-controlled.  Obesity: increasing Ozempic might help.   Patient Instructions  In order for the V-GO-40 to work, we'll need to reduce your daily insulin need.  I have sent a prescription to your pharmacy, to increase the Ozempic.    Please reduce the Tresiba to 25 units daily, and:  Please reduce the Humalog to 20 units twice a day (just before meals).   check your blood sugar twice a day.  vary the time of day when you check, between before the 3 meals, and at bedtime.  also check if you have symptoms of your blood sugar being too high or too low.  please keep a record of the readings and bring it to your next appointment here (or you can bring the meter itself).  You can write it on any piece of paper.  please call us sooner if your blood sugar goes below 70, or if you have a lot of readings over 200.  Please come back for a follow-up appointment in approx 2 months.

## 2019-12-02 NOTE — Patient Instructions (Addendum)
In order for the V-GO-40 to work, we'll need to reduce your daily insulin need.  I have sent a prescription to your pharmacy, to increase the Ozempic.    Please reduce the Tresiba to 25 units daily, and:  Please reduce the Humalog to 20 units twice a day (just before meals).   check your blood sugar twice a day.  vary the time of day when you check, between before the 3 meals, and at bedtime.  also check if you have symptoms of your blood sugar being too high or too low.  please keep a record of the readings and bring it to your next appointment here (or you can bring the meter itself).  You can write it on any piece of paper.  please call us sooner if your blood sugar goes below 70, or if you have a lot of readings over 200.  Please come back for a follow-up appointment in approx 2 months.

## 2019-12-04 ENCOUNTER — Other Ambulatory Visit: Payer: Self-pay | Admitting: Internal Medicine

## 2019-12-04 MED FILL — DILTIAZEM ER 240 MG TABLET: 240 | 90 days supply | Qty: 90 | Fill #0

## 2019-12-04 MED FILL — OZEMPIC (1 MG/DOSE) 4 MG/3M: 4 | 84 days supply | Qty: 9 | Fill #0

## 2019-12-05 ENCOUNTER — Ambulatory Visit: Payer: PRIVATE HEALTH INSURANCE | Admitting: Physical Therapy

## 2019-12-05 ENCOUNTER — Encounter: Payer: 59 | Admitting: Physical Therapy

## 2019-12-10 ENCOUNTER — Encounter: Payer: 59 | Admitting: Physical Therapy

## 2019-12-12 ENCOUNTER — Ambulatory Visit: Payer: PRIVATE HEALTH INSURANCE | Admitting: Physical Therapy

## 2019-12-12 ENCOUNTER — Encounter: Payer: 59 | Admitting: Physical Therapy

## 2019-12-16 ENCOUNTER — Encounter: Payer: 59 | Admitting: Physical Therapy

## 2019-12-18 ENCOUNTER — Encounter: Payer: 59 | Admitting: Physical Therapy

## 2019-12-24 ENCOUNTER — Encounter: Payer: 59 | Admitting: Physical Therapy

## 2019-12-24 MED FILL — ATORVASTATIN 20 MG TABLET: 20 | 90 days supply | Qty: 90 | Fill #2

## 2019-12-26 ENCOUNTER — Encounter: Payer: 59 | Admitting: Physical Therapy

## 2019-12-30 ENCOUNTER — Encounter: Payer: 59 | Admitting: Physical Therapy

## 2019-12-31 ENCOUNTER — Encounter: Payer: 59 | Admitting: Physical Therapy

## 2020-01-03 MED FILL — AJOVY 225 MG/1.5ML SOAJ: 225 | 90 days supply | Qty: 5 | Fill #2

## 2020-01-03 MED FILL — UNIFINE PENTIPS 32GX5/32: 32G X 4 MM | 25 days supply | Qty: 100 | Fill #2

## 2020-01-03 MED FILL — HUMALOG 100 UNITS/ML KWIKPE: 100 | 28 days supply | Qty: 21 | Fill #2

## 2020-01-07 ENCOUNTER — Encounter: Payer: 59 | Admitting: Physical Therapy

## 2020-01-07 ENCOUNTER — Other Ambulatory Visit: Payer: Self-pay | Admitting: Internal Medicine

## 2020-01-08 ENCOUNTER — Other Ambulatory Visit (HOSPITAL_COMMUNITY): Payer: Self-pay | Admitting: Internal Medicine

## 2020-01-08 MED FILL — METOPROLOL SUCCINATE ER 25: 25 | 90 days supply | Qty: 45 | Fill #0

## 2020-01-09 ENCOUNTER — Encounter: Payer: 59 | Admitting: Physical Therapy

## 2020-01-09 ENCOUNTER — Encounter: Payer: Self-pay | Admitting: *Deleted

## 2020-01-13 ENCOUNTER — Encounter: Payer: 59 | Admitting: Physical Therapy

## 2020-01-13 ENCOUNTER — Telehealth: Payer: Self-pay | Admitting: Internal Medicine

## 2020-01-13 NOTE — Telephone Encounter (Signed)
[name]'s Letter of eXemption needs to be printed out for signing and faxed with the exemption form which I have.  I cannot print from home. Can you print it and put in a new red folder? The deadline is August 20.  thanks

## 2020-01-14 ENCOUNTER — Encounter: Payer: 59 | Admitting: Physical Therapy

## 2020-01-14 NOTE — Telephone Encounter (Signed)
Pt returned your call.  

## 2020-01-14 NOTE — Telephone Encounter (Signed)
LMTCB

## 2020-01-14 NOTE — Telephone Encounter (Signed)
Spoke with pt to let her know that there is no way for [place] to upload the paperwork back to her mychart so pt asked that we fax it back her at work. Forms have been emailed to [employer] [facility] and faxed back to pt.

## 2020-01-14 NOTE — Telephone Encounter (Signed)
Letter has been printed and signed.

## 2020-01-15 ENCOUNTER — Encounter: Payer: 59 | Admitting: Physical Therapy

## 2020-01-16 ENCOUNTER — Encounter: Payer: 59 | Admitting: Physical Therapy

## 2020-01-20 ENCOUNTER — Encounter: Payer: 59 | Admitting: Physical Therapy

## 2020-01-22 ENCOUNTER — Encounter (INDEPENDENT_AMBULATORY_CARE_PROVIDER_SITE_OTHER): Payer: Self-pay | Admitting: Ophthalmology

## 2020-01-22 ENCOUNTER — Other Ambulatory Visit: Payer: Self-pay

## 2020-01-22 ENCOUNTER — Ambulatory Visit (INDEPENDENT_AMBULATORY_CARE_PROVIDER_SITE_OTHER): Payer: 59 | Admitting: Ophthalmology

## 2020-01-22 DIAGNOSIS — H35033 Hypertensive retinopathy, bilateral: Secondary | ICD-10-CM

## 2020-01-22 DIAGNOSIS — H209 Unspecified iridocyclitis: Secondary | ICD-10-CM | POA: Diagnosis not present

## 2020-01-22 DIAGNOSIS — H3581 Retinal edema: Secondary | ICD-10-CM

## 2020-01-22 DIAGNOSIS — I10 Essential (primary) hypertension: Secondary | ICD-10-CM | POA: Diagnosis not present

## 2020-01-22 DIAGNOSIS — E119 Type 2 diabetes mellitus without complications: Secondary | ICD-10-CM

## 2020-01-22 DIAGNOSIS — H25813 Combined forms of age-related cataract, bilateral: Secondary | ICD-10-CM | POA: Diagnosis not present

## 2020-01-22 MED ORDER — PREDNISOLONE ACETATE 1 % OP SUSP: 1.0000 [drp] | OPHTHALMIC | 0 refills | Status: DC

## 2020-01-22 MED ORDER — PROLENSA 0.07 % OP SOLN: 1.0000 [drp] | Freq: Four times a day (QID) | OPHTHALMIC | 3 refills | Status: DC

## 2020-01-22 MED ORDER — DORZOLAMIDE HCL-TIMOLOL MAL 2-0.5 % OP SOLN
1.0000 [drp] | Freq: Two times a day (BID) | OPHTHALMIC | 1 refills | Status: DC
Start: 2020-01-22 — End: 2020-06-22

## 2020-01-22 MED FILL — PROLENSA 0.07% EYE DROPS: 0.07 | 16 days supply | Qty: 3 | Fill #0

## 2020-01-22 MED FILL — PREDNISOLONE AC 1% EYE DROP: 1 | 10 days supply | Qty: 15 | Fill #0

## 2020-01-22 MED FILL — DORZOLAMIDE-TIMOLOL EYE DRP: 22.3-6.8 | 50 days supply | Qty: 10 | Fill #0

## 2020-01-22 NOTE — Progress Notes (Addendum)
[facility] Note  01/22/2020     CHIEF COMPLAINT Patient presents for Retina Evaluation   HISTORY OF PRESENT ILLNESS: [name] is a 49 y.o. female who presents to the clinic today for:   HPI    Retina Evaluation    In both eyes.  This started 7 months ago.  Duration of 7 months.  Associated Symptoms Pain, Glare and Redness.  I, the attending physician,  performed the HPI with the patient and updated documentation appropriately.          Comments    Patient here for Retina Evaluation. Patient states vision is a little fuzzy today. Eye don't hurt as bad today. Eyes hurt to touch, eye are really red. Photophobic sensitive. Used cold compresses on eye. Even laid on stomach and laid on cold compress on eye to make pressure to help. Saw dr at [facility] eye. Put on Prednisone QID ou. Ketorolac helped the best. Ran out yesterday. On atropine TID OU. Uses systane 5 -6 times a day. It started January when got covid it flared up.  Jan. pt was told she had uveitis.  It is rare for both eyes to flare up at the same time. BS: 101 this am, A1C: 6.5 in July       Last edited by Bernarda Caffey, MD on 01/22/2020 12:41 PM. (History)    pt here on the referral of [place] [facility] for uveitis OU, pt states she has been seeing them for almost a year, she states she contracted covid and then uveitis at the same time, she states her eyes are very painful to touch and also very sensitive to light, she has been using Ketorolac and atropine, she states the atropine helps for maybe 30-45 mins, she has previously used pred up to 8x a day, she states she has had labs drawn 2 times to check for autoimmune / inflammatory disorders and they came back negative both times  Referring physician: Crecencio Mc, MD [address] [place],  [place] [address]  HISTORICAL INFORMATION:   Selected notes from the MEDICAL RECORD NUMBER Self referral for anterior uveitis LEE:   Ocular Hx- PMH-    CURRENT MEDICATIONS: Current Outpatient Medications (Ophthalmic Drugs)  Medication Sig  . Bromfenac Sodium (PROLENSA) 0.07 % SOLN Place 1 drop into both eyes 4 (four) times daily.  . dorzolamide-timolol (COSOPT) 22.3-6.8 MG/ML ophthalmic solution Place 1 drop into both eyes 2 (two) times daily.  . ISOPTO ATROPINE 1 % ophthalmic solution   . prednisoLONE acetate (PRED FORTE) 1 % ophthalmic suspension Place 1 drop into both eyes every hour.   No current facility-administered medications for this visit. (Ophthalmic Drugs)   Current Outpatient Medications (Other)  Medication Sig  . albuterol (VENTOLIN HFA) 108 (90 Base) MCG/ACT inhaler Inhale 2 puffs into the lungs every 6 (six) hours as needed for wheezing or shortness of breath.  . ALPRAZolam (XANAX) 0.5 MG tablet Take 1 tablet (0.5 mg total) by mouth 3 (three) times daily as needed (Headache).  [name] aspirin EC 81 MG tablet Take 81 mg by mouth daily.  [name] atorvastatin (LIPITOR) 20 MG tablet Take 1 tablet (20 mg total) by mouth daily at 6 PM.  . benzonatate (TESSALON) 100 MG capsule Take 1 capsule (100 mg total) by mouth 3 (three) times daily as needed for cough.  . Blood Glucose Monitoring Suppl (FREESTYLE LITE) DEVI 1 each by Does not apply route 2 (two) times daily.  E11.9  . butalbital-acetaminophen-caffeine (FIORICET) 50-325-40 MG tablet Take 1 tablet by mouth every 6 (six) hours as needed for headache. Do not refill in less than 30 day  . celecoxib (CELEBREX) 200 MG capsule Take 1 capsule (200 mg total) by mouth 2 (two) times daily as needed.  . diazepam (VALIUM) 2 MG tablet Take 1 tablet (2 mg total) by mouth every 6 (six) hours as needed (Headache).  . diltiazem (CARDIZEM LA) 240 MG 24 hr tablet TAKE 1 TABLET BY MOUTH DAILY  . EPINEPHrine 0.3 mg/0.3 mL IJ SOAJ injection Inject 0.3 mLs (0.3 mg total) into the muscle as needed (for allergic reaction).  [name] ergocalciferol (DRISDOL) 1.25 MG (50000 UT) capsule Take 1 capsule  (50,000 Units total) by mouth once a week.  . Fremanezumab-vfrm (AJOVY) 225 MG/1.5ML SOAJ Inject 675 mg into the skin every 3 (three) months.  [name] glucose blood (FREESTYLE LITE) test strip 1 each by Other route 2 (two) times daily. E11.9  . hydrochlorothiazide (MICROZIDE) 12.5 MG capsule TAKE 1 CAPSULE BY MOUTH DAILY.  [name] insulin degludec (TRESIBA FLEXTOUCH) 100 UNIT/ML FlexTouch Pen Inject 0.25 mLs (25 Units total) into the skin daily.  . insulin lispro (HUMALOG KWIKPEN) 100 UNIT/ML KwikPen Inject 0.2 mLs (20 Units total) into the skin 2 (two) times daily with a meal.  . Insulin Pen Needle (PEN NEEDLES 31GX5/16") 31G X 8 MM MISC 1 each by Does not apply route in the morning, at noon, in the evening, and at bedtime. E11.9  . ketorolac (TORADOL) 10 MG tablet Take 1 tablet (10 mg total) by mouth every 6 (six) hours as needed. (Patient taking differently: Take 10 mg by mouth every 6 (six) hours as needed (Headache). )  . Lancets (FREESTYLE) lancets 1 each by Other route 2 (two) times daily. E11.9  . losartan-hydrochlorothiazide (HYZAAR) 50-12.5 MG tablet Take 1 tablet by mouth daily.  . metFORMIN (GLUCOPHAGE-XR) 500 MG 24 hr tablet Take 2 tablets (1,000 mg total) by mouth 2 (two) times daily.  . metoprolol succinate (TOPROL-XL) 25 MG 24 hr tablet TAKE 1/2 TABLET BY MOUTH DAILY.  [name] ondansetron (ZOFRAN ODT) 4 MG disintegrating tablet Take 1 tablet (4 mg total) by mouth every 8 (eight) hours as needed for nausea or vomiting.  [name] oxyCODONE-acetaminophen (PERCOCET/ROXICET) 5-325 MG tablet Take by mouth as needed for severe pain.  . Semaglutide, 1 MG/DOSE, (OZEMPIC, 1 MG/DOSE,) 2 MG/1.5ML SOPN Inject 0.75 mLs (1 mg total) into the skin once a week.   No current facility-administered medications for this visit. (Other)      REVIEW OF SYSTEMS: ROS    Positive for: Neurological, Musculoskeletal, Endocrine, Cardiovascular, Eyes, Psychiatric   Last edited by Leonie Douglas, COA on 01/22/2020 10:00 AM. (History)        ALLERGIES Allergies  Allergen Reactions  . Bamlanivimab Anaphylaxis, Itching, Palpitations, Rash, Shortness Of Breath and Swelling  . Botox [Onabotulinumtoxina] Shortness Of Breath    syncope  . Clindamycin/Lincomycin Other (See Comments)  . Kiwi Extract Shortness Of Breath  . Maxalt [Rizatriptan Benzoate] Anaphylaxis    Chest pain  . Nitrous Oxide Shortness Of Breath    syncope  . Triptans Shortness Of Breath  . Aspirin Nausea And Vomiting    Mouth blisters  . Contrast Media [Iodinated Diagnostic Agents]   . Flagyl [Metronidazole]   . Hydrocodone-Acetaminophen Other (See Comments)  . Latex     Sometimes causes rash  . Mango Flavor   . Rizatriptan Other (See Comments)  . Vicodin [Hydrocodone-Acetaminophen]  hallucinations  . Flu Virus Vaccine Palpitations  . Penicillins Nausea And Vomiting, Rash and Other (See Comments)    Did it involve swelling of the face/tongue/throat, SOB, or low BP? No Did it involve sudden or severe rash/hives, skin peeling, or any reaction on the inside of your mouth or nose? No Did you need to seek medical attention at a hospital or doctor's office? Yes When did it last happen?patient was 49 years old If all above answers are "NO", may proceed with cephalosporin use.  . Tetracyclines & Related Nausea And Vomiting and Rash    PAST MEDICAL HISTORY Past Medical History:  Diagnosis Date  . Allergy   . Anemia   . Anxiety    claustrophobic  . Asthma   . Back pain   . Biceps tendonosis of right shoulder   . Diabetes mellitus 2011   did not start metforfin, losing weight  . Dyspnea   . Fatty liver   . Food allergy   . Headache disorder   . History of concussion   . HLD (hyperlipidemia)   . Hypertension   . IBS (irritable bowel syndrome)   . Infertility, female   . Joint pain   . Lactose intolerance   . Leg edema   . Migraines   . Other specified disorders of thyroid   . Palpitation   . Post-menopausal   . Seborrheic  dermatitis   . Shoulder impingement syndrome, right   . Vitamin D deficiency    Past Surgical History:  Procedure Laterality Date  . ABDOMINAL HYSTERECTOMY  2006   heavy menses, endometriosis, l oophrectomy  . BACK SURGERY    . BREAST BIOPSY Right 2018   benign  . BREAST EXCISIONAL BIOPSY Right 2003   benign  . BREAST EXCISIONAL BIOPSY Right 1999   benign  . BREAST SURGERY     right breast x 2 , benign  . HERNIA REPAIR  2003   left inguinal   . LEFT OOPHORECTOMY    . SHOULDER ARTHROSCOPY WITH SUBACROMIAL DECOMPRESSION AND OPEN ROTATOR C Right 07/14/2016   Procedure: RIGHT SHOULDER ARTHROSCOPY WITH SUBACROMIAL DECOMPRESSION, DISTAL CLAVICLE RESECTION AND MINI OPEN ROTATOR CUFF REPAIR, OPEN BICEP TENDODESIS;  Surgeon: Garald Balding, MD;  Location: [place];  Service: Orthopedics;  Laterality: Right;  . SHOULDER CLOSED REDUCTION Right 09/08/2016   Procedure: RIGHT CLOSED MANIPULATION SHOULDER;  Surgeon: Garald Balding, MD;  Location: [facility];  Service: Orthopedics;  Laterality: Right;  . TUBAL LIGATION      FAMILY HISTORY Family History  Problem Relation Age of Onset  . Diabetes Mother   . Heart disease Mother 54  . Hypertension Mother   . Hyperlipidemia Mother   . Heart failure Mother   . Stroke Mother   . Thyroid disease Mother   . Depression Mother   . Sleep apnea Mother   . Obesity Mother   . Cancer Father 71       Lung Cancer  . Heart disease Father   . Mental illness Sister        bipolar, substance abuse,  clean 2 yrs  . Hypertension Sister   . Cancer Paternal Grandmother 40       breast cancer  . Breast cancer Paternal Grandmother   . Diabetes Maternal Grandmother   . Hypertension Maternal Grandmother   . Diabetes Son     SOCIAL HISTORY Social History   Tobacco Use  . Smoking status: Never Smoker  .  Smokeless tobacco: Never Used  Vaping Use  . Vaping Use: Never used  Substance Use Topics  . Alcohol use: No  .  Drug use: No         OPHTHALMIC EXAM:  Base Eye Exam    Visual Acuity (Snellen - Linear)      Right Left   Dist cc 20/70 -1 20/30 -1   Dist ph cc 20/25 20/20   Correction: Glasses       Tonometry (Tonopen, 9:32 AM)      Right Left   Pressure 18 23       Pupils      Dark   Right dilated   Left dilated       Visual Fields (Counting fingers)      Left Right    Full Full       Extraocular Movement      Right Left    Full, Ortho Full, Ortho       Neuro/Psych    Oriented x3: Yes   Mood/Affect: Normal       Dilation    Both eyes: 1.0% Mydriacyl, 2.5% Phenylephrine @ 9:32 AM        Slit Lamp and Fundus Exam    Slit Lamp Exam      Right Left   Lids/Lashes Normal Normal   Conjunctiva/Sclera 2+ Injection trace Injection   Cornea arcus arcus, trace Punctate epithelial erosions   Anterior Chamber Deep, 1-2+ Cell Deep, 1-2+ Cell   Iris Round and dilated Round and dilated   Lens 2+ Nuclear sclerosis, 2+ Cortical cataract 2+ Nuclear sclerosis, 2+ Cortical cataract   Vitreous clear, no cell or vitritis clear, no cell or vitritis       Fundus Exam      Right Left   Disc Pink and Sharp, +cupping Pink and Sharp, +cupping   C/D Ratio 0.6 0.6   Macula Flat, Good foveal reflex, RPE mottling, No heme or edema Flat, Good foveal reflex, RPE mottling, No heme or edema   Vessels mild attenuation, mild AV crossing changes mild attenuation, mild AV crossing changes   Periphery Attached    Attached           Refraction    Wearing Rx      Sphere Cylinder Axis Add   Right +1.00 +1.00 056 +1.25   Left +0.75 +1.25 132 +1.25       Manifest Refraction      Sphere Cylinder Axis Dist VA   Right +1.25 +0.75 045 20/25   Left +0.50 +0.75 135 20/30+2          IMAGING AND PROCEDURES  Imaging and Procedures for 01/22/2020  OCT, Retina - OU - Both Eyes       Right Eye Quality was good. Central Foveal Thickness: 241. Progression has no prior data. Findings include normal  foveal contour, no IRF, no SRF, vitreomacular adhesion .   Left Eye Quality was good. Central Foveal Thickness: 237. Progression has no prior data. Findings include normal foveal contour, no IRF, no SRF.   Notes *Images captured and stored on drive  Diagnosis / Impression:  NFP; no IRF/SRF  Clinical management:  See below  Abbreviations: NFP - Normal foveal profile. CME - cystoid macular edema. PED - pigment epithelial detachment. IRF - intraretinal fluid. SRF - subretinal fluid. EZ - ellipsoid zone. ERM - epiretinal membrane. ORA - outer retinal atrophy. ORT - outer retinal tubulation. SRHM - subretinal hyper-reflective material. 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 Retinal edema  H35.81 OCT, Retina - OU - Both Eyes  3. Essential hypertension  I10   4. Hypertensive retinopathy of both eyes  H35.033   5. Diabetes mellitus type 2 without retinopathy ([place])  E11.9   6. Combined forms of age-related cataract of both eyes  H25.813     1,2. Anterior uveitis OU  - eye pain, redness, photophobia since Dec 2020, after getting COVID infection (was hospitalized for 2 wks)  - has been [facility] by [facility]  - underwent limited lab work up on 06/21/19   Quant-Gold, ACE, ANA, HLA-B27 -- all normal   ESR slightly elevated (37)  - currently on cyclopentolate BID OU, ran out of ketorolac, not on PF at this time (history of steroid response)  - today on exam -- +conj injection OU (OD > OS); 1-2+ AC cell OU; no evidence of posterior involvement  - BCVA 20/25 OD, 20/30 OS  - OCT without macular edema OU  - recommend    PF q1h while awake OU,    prolensa QID OU,    Cyclopentolate BID OU   Cosopt BID OU  - f/u in 1 wk -- DFE/OCT, FA transit OD  - if no improvement, may need oral NSAID and/or oral prednisone  3,4. Hypertensive retinopathy OU - discussed importance of tight BP control -  monitor  5. Diabetes mellitus, type 2 without retinopathy  - The incidence, risk factors for progression, natural history and treatment options for diabetic retinopathy  were discussed with patient.    - The need for close monitoring of blood glucose, blood pressure, and serum lipids, avoiding cigarette or any type of tobacco, and the need for long term follow up was also discussed with patient.  - f/u in 1 year, sooner prn  6. Mixed cataract OU  - The symptoms of cataract, surgical options, and treatments and risks were discussed with patient.  - discussed diagnosis and progression  - not yet visually significant  - monitor for now   Ophthalmic Meds Ordered this visit:  Meds ordered this encounter  Medications  . prednisoLONE acetate (PRED FORTE) 1 % ophthalmic suspension    Sig: Place 1 drop into both eyes every hour.    Dispense:  15 mL    Refill:  0  . dorzolamide-timolol (COSOPT) 22.3-6.8 MG/ML ophthalmic solution    Sig: Place 1 drop into both eyes 2 (two) times daily.    Dispense:  10 mL    Refill:  1  . Bromfenac Sodium (PROLENSA) 0.07 % SOLN    Sig: Place 1 drop into both eyes 4 (four) times daily.    Dispense:  6 mL    Refill:  3      Return in about 1 week (around 01/29/2020) for [facility] check, Dilated Exam, OCT.  There are no Patient Instructions on file for this visit.   Explained the diagnoses, plan, and follow up with the patient and they expressed understanding.  Patient expressed understanding of the importance of proper follow up care.   This document serves as a record of services personally performed by Gardiner Sleeper, MD, PhD. It was created on their behalf by San Jetty. Owens Shark, OA an ophthalmic technician. The creation of this record is the provider's dictation and/or activities during the visit.  Electronically signed by: San Jetty. Marguerita Merles 08.18.2021 12:56 PM   Gardiner Sleeper, M.D., Ph.D. Diseases & Surgery of the Retina and Vitreous [facility]  I have reviewed the above documentation for accuracy and completeness, and I agree with the above. Gardiner Sleeper, M.D., Ph.D. 01/22/20 12:56 PM     Abbreviations: M myopia (nearsighted); A astigmatism; H hyperopia (farsighted); P presbyopia; Mrx spectacle prescription;  CTL contact lenses; OD right eye; OS left eye; OU both eyes  XT exotropia; ET esotropia; PEK punctate epithelial keratitis; PEE punctate epithelial erosions; DES dry eye syndrome; MGD meibomian gland dysfunction; ATs artificial tears; PFAT's preservative free artificial tears; [place] nuclear sclerotic cataract; PSC posterior subcapsular cataract; ERM epi-retinal membrane; PVD posterior vitreous detachment; RD retinal detachment; DM diabetes mellitus; DR diabetic retinopathy; NPDR non-proliferative diabetic retinopathy; PDR proliferative diabetic retinopathy; CSME clinically significant macular edema; DME diabetic macular edema; dbh dot blot hemorrhages; CWS cotton wool spot; POAG primary open angle glaucoma; C/D cup-to-disc ratio; HVF humphrey visual field; GVF goldmann visual field; OCT optical coherence tomography; IOP intraocular pressure; BRVO Branch retinal vein occlusion; CRVO central retinal vein occlusion; CRAO central retinal artery occlusion; BRAO branch retinal artery occlusion; RT retinal tear; SB scleral buckle; PPV pars plana vitrectomy; VH Vitreous hemorrhage; PRP panretinal laser photocoagulation; IVK intravitreal kenalog; VMT vitreomacular traction; MH Macular hole;  NVD neovascularization of the disc; NVE neovascularization elsewhere; AREDS age related eye disease study; ARMD age related macular degeneration; POAG primary open angle glaucoma; EBMD epithelial/anterior basement membrane dystrophy; ACIOL anterior chamber intraocular lens; IOL intraocular lens; PCIOL posterior chamber intraocular lens; Phaco/IOL phacoemulsification with intraocular lens placement; [place] photorefractive keratectomy; LASIK laser  assisted in situ keratomileusis; HTN hypertension; DM diabetes mellitus; COPD chronic obstructive pulmonary disease

## 2020-01-23 ENCOUNTER — Encounter: Payer: 59 | Admitting: Physical Therapy

## 2020-01-27 ENCOUNTER — Encounter: Payer: 59 | Admitting: Physical Therapy

## 2020-01-27 NOTE — Progress Notes (Signed)
[facility] Note  01/29/2020     CHIEF COMPLAINT Patient presents for Retina Follow Up   HISTORY OF PRESENT ILLNESS: [name] is a 49 y.o. female who presents to the clinic today for:   HPI    Retina Follow Up    Patient presents with  Other.  In both eyes.  This started weeks ago.  Severity is moderate.  Duration of weeks.  Since onset it is stable.  I, the attending physician,  performed the HPI with the patient and updated documentation appropriately.          Comments    Pt states her vision is stable OU.  Pt complains of pain today OU and complains of feeling of "pressure" in her right eye.  Patient denies any new or worsening floaters or fol OU.  Patient is having difficult time getting Cyclo in because it makes her vision blurry and she is unable to work.       Last edited by Bernarda Caffey, MD on 01/29/2020  9:19 AM. (History)    pt had FA today and is feeling nauseous from it, pt feels like her lsensitivity to light has decreased a little since her last visit here, she had some right eye pain yesterday, but says it feels more like pressure, she states she is keeping up with all her drops, but she hasn't taken the cyclopentolate that much since Saturday bc she cant see or work while using it  Referring physician: Crecencio Mc, MD 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 Bromfenac Sodium (PROLENSA) 0.07 % SOLN Place 1 drop into both eyes 4 (four) times daily.  . dorzolamide-timolol (COSOPT) 22.3-6.8 MG/ML ophthalmic solution Place 1 drop into both eyes 2 (two) times daily.  . ISOPTO ATROPINE 1 % ophthalmic solution   . prednisoLONE acetate (PRED FORTE) 1 % ophthalmic suspension Place 1 drop into  both eyes every hour.   No current facility-administered medications for this visit. (Ophthalmic Drugs)   Current Outpatient Medications (Other)  Medication Sig  . albuterol (VENTOLIN HFA) 108 (90 Base) MCG/ACT inhaler Inhale 2 puffs into the lungs every 6 (six) hours as needed for wheezing or shortness of breath.  . ALPRAZolam (XANAX) 0.5 MG tablet Take 1 tablet (0.5 mg total) by mouth 3 (three) times daily as needed (Headache).  [name] aspirin EC 81 MG tablet Take 81 mg by mouth daily.  [name] atorvastatin (LIPITOR) 20 MG tablet Take 1 tablet (20 mg total) by mouth daily at 6 PM.  . benzonatate (TESSALON) 100 MG capsule Take 1 capsule (100 mg total) by mouth 3 (three) times daily as needed for cough.  . Blood Glucose Monitoring Suppl (FREESTYLE LITE) DEVI 1 each by Does not apply route 2 (two) times daily. E11.9  . butalbital-acetaminophen-caffeine (FIORICET) 50-325-40 MG tablet Take 1 tablet by mouth every 6 (six) hours as needed for headache. Do not refill in less than 30 day  . celecoxib (CELEBREX) 200 MG capsule Take 1 capsule (200 mg total) by mouth 2 (two) times daily as needed.  . diazepam (VALIUM) 2 MG tablet Take 1 tablet (2 mg total) by mouth every 6 (six) hours as needed (Headache).  . diltiazem (CARDIZEM LA) 240 MG 24 hr  tablet TAKE 1 TABLET BY MOUTH DAILY  . EPINEPHrine 0.3 mg/0.3 mL IJ SOAJ injection Inject 0.3 mLs (0.3 mg total) into the muscle as needed (for allergic reaction).  [name] ergocalciferol (DRISDOL) 1.25 MG (50000 UT) capsule Take 1 capsule (50,000 Units total) by mouth once a week.  . Fremanezumab-vfrm (AJOVY) 225 MG/1.5ML SOAJ Inject 675 mg into the skin every 3 (three) months.  [name] glucose blood (FREESTYLE LITE) test strip 1 each by Other route 2 (two) times daily. E11.9  . hydrochlorothiazide (MICROZIDE) 12.5 MG capsule TAKE 1 CAPSULE BY MOUTH DAILY.  [name] insulin degludec (TRESIBA FLEXTOUCH) 100 UNIT/ML FlexTouch Pen Inject 0.25 mLs (25 Units total) into the skin daily.  . insulin  lispro (HUMALOG KWIKPEN) 100 UNIT/ML KwikPen Inject 0.2 mLs (20 Units total) into the skin 2 (two) times daily with a meal.  . Insulin Pen Needle (PEN NEEDLES 31GX5/16") 31G X 8 MM MISC 1 each by Does not apply route in the morning, at noon, in the evening, and at bedtime. E11.9  . ketorolac (TORADOL) 10 MG tablet Take 1 tablet (10 mg total) by mouth every 6 (six) hours as needed. (Patient taking differently: Take 10 mg by mouth every 6 (six) hours as needed (Headache). )  . Lancets (FREESTYLE) lancets 1 each by Other route 2 (two) times daily. E11.9  . losartan-hydrochlorothiazide (HYZAAR) 50-12.5 MG tablet Take 1 tablet by mouth daily.  . metFORMIN (GLUCOPHAGE-XR) 500 MG 24 hr tablet Take 2 tablets (1,000 mg total) by mouth 2 (two) times daily.  . metoprolol succinate (TOPROL-XL) 25 MG 24 hr tablet TAKE 1/2 TABLET BY MOUTH DAILY.  [name] ondansetron (ZOFRAN ODT) 4 MG disintegrating tablet Take 1 tablet (4 mg total) by mouth every 8 (eight) hours as needed for nausea or vomiting.  [name] oxyCODONE-acetaminophen (PERCOCET/ROXICET) 5-325 MG tablet Take by mouth as needed for severe pain.  . Semaglutide, 1 MG/DOSE, (OZEMPIC, 1 MG/DOSE,) 2 MG/1.5ML SOPN Inject 0.75 mLs (1 mg total) into the skin once a week.   No current facility-administered medications for this visit. (Other)      REVIEW OF SYSTEMS: ROS    Positive for: Neurological, Musculoskeletal, Endocrine, Cardiovascular, Eyes, Psychiatric   Negative for: Constitutional, Gastrointestinal, Skin, Genitourinary, HENT, Respiratory, Allergic/Imm, Heme/Lymph   Last edited by Doneen Poisson on 01/29/2020  8:49 AM. (History)       ALLERGIES Allergies  Allergen Reactions  . Bamlanivimab Anaphylaxis, Itching, Palpitations, Rash, Shortness Of Breath and Swelling  . Botox [Onabotulinumtoxina] Shortness Of Breath    syncope  . Clindamycin/Lincomycin Other (See Comments)  . Kiwi Extract Shortness Of Breath  . Maxalt [Rizatriptan Benzoate] Anaphylaxis     Chest pain  . Nitrous Oxide Shortness Of Breath    syncope  . Triptans Shortness Of Breath  . Aspirin Nausea And Vomiting    Mouth blisters  . Contrast Media [Iodinated Diagnostic Agents]   . Flagyl [Metronidazole]   . Hydrocodone-Acetaminophen Other (See Comments)  . Latex     Sometimes causes rash  . Mango Flavor   . Rizatriptan Other (See Comments)  . Vicodin [Hydrocodone-Acetaminophen]     hallucinations  . Flu Virus Vaccine Palpitations  . Penicillins Nausea And Vomiting, Rash and Other (See Comments)    Did it involve swelling of the face/tongue/throat, SOB, or low BP? No Did it involve sudden or severe rash/hives, skin peeling, or any reaction on the inside of your mouth or nose? No Did you need to seek medical attention at a hospital or doctor's  office? Yes When did it last happen?patient was 49 years old If all above answers are "NO", may proceed with cephalosporin use.  . Tetracyclines & Related Nausea And Vomiting and Rash    PAST MEDICAL HISTORY Past Medical History:  Diagnosis Date  . Allergy   . Anemia   . Anxiety    claustrophobic  . Asthma   . Back pain   . Biceps tendonosis of right shoulder   . Diabetes mellitus 2011   did not start metforfin, losing weight  . Dyspnea   . Fatty liver   . Food allergy   . Headache disorder   . History of concussion   . HLD (hyperlipidemia)   . Hypertension   . IBS (irritable bowel syndrome)   . Infertility, female   . Joint pain   . Lactose intolerance   . Leg edema   . Migraines   . Other specified disorders of thyroid   . Palpitation   . Post-menopausal   . Seborrheic dermatitis   . Shoulder impingement syndrome, right   . Vitamin D deficiency    Past Surgical History:  Procedure Laterality Date  . ABDOMINAL HYSTERECTOMY  2006   heavy menses, endometriosis, l oophrectomy  . BACK SURGERY    . BREAST BIOPSY Right 2018   benign  . BREAST EXCISIONAL BIOPSY Right 2003   benign  . BREAST  EXCISIONAL BIOPSY Right 1999   benign  . BREAST SURGERY     right breast x 2 , benign  . HERNIA REPAIR  2003   left inguinal   . LEFT OOPHORECTOMY    . SHOULDER ARTHROSCOPY WITH SUBACROMIAL DECOMPRESSION AND OPEN ROTATOR C Right 07/14/2016   Procedure: RIGHT SHOULDER ARTHROSCOPY WITH SUBACROMIAL DECOMPRESSION, DISTAL CLAVICLE RESECTION AND MINI OPEN ROTATOR CUFF REPAIR, OPEN BICEP TENDODESIS;  Surgeon: Garald Balding, MD;  Location: [facility];  Service: Orthopedics;  Laterality: Right;  . SHOULDER CLOSED REDUCTION Right 09/08/2016   Procedure: RIGHT CLOSED MANIPULATION SHOULDER;  Surgeon: Garald Balding, MD;  Location: [facility];  Service: Orthopedics;  Laterality: Right;  . TUBAL LIGATION      FAMILY HISTORY Family History  Problem Relation Age of Onset  . Diabetes Mother   . Heart disease Mother 7  . Hypertension Mother   . Hyperlipidemia Mother   . Heart failure Mother   . Stroke Mother   . Thyroid disease Mother   . Depression Mother   . Sleep apnea Mother   . Obesity Mother   . Cancer Father 6       Lung Cancer  . Heart disease Father   . Mental illness Sister        bipolar, substance abuse,  clean 2 yrs  . Hypertension Sister   . Cancer Paternal Grandmother 37       breast cancer  . Breast cancer Paternal Grandmother   . Diabetes Maternal Grandmother   . Hypertension Maternal Grandmother   . Diabetes Son     SOCIAL HISTORY Social History   Tobacco Use  . Smoking status: Never Smoker  . Smokeless tobacco: Never Used  Vaping Use  . Vaping Use: Never used  Substance Use Topics  . Alcohol use: No  . Drug use: No         OPHTHALMIC EXAM:  Base Eye Exam    Visual Acuity (Snellen - Linear)      Right Left   Dist cc 20/40 -2 20/40 -2  Dist ph cc NI 20/25 -2   Correction: Glasses       Tonometry (Tonopen, 8:56 AM)      Right Left   Pressure 24 22       Pupils      Dark Light Shape React APD   Right 6 6 Round  Dilated 0   Left 6 6 Round Dilated 0       Visual Fields      Left Right    Full Full       Extraocular Movement      Right Left    Full Full       Neuro/Psych    Oriented x3: Yes   Mood/Affect: Normal       Dilation    Both eyes: 1.0% Mydriacyl, 2.5% Phenylephrine @ 8:56 AM        Slit Lamp and Fundus Exam    Slit Lamp Exam      Right Left   Lids/Lashes Normal Normal   Conjunctiva/Sclera Trace Injection trace Injection   Cornea arcus, trace, inferior Punctate epithelial erosions arcus, 1+ inferior Punctate epithelial erosions   Anterior Chamber Deep, 0.5+Cell Deep, 0.5+Cell   Iris Round and dilated Round and dilated   Lens 2+ Nuclear sclerosis, 2+ Cortical cataract 2+ Nuclear sclerosis, 2+ Cortical cataract   Vitreous no cell or vitritis, Vitreous syneresis no cell or vitritis, Vitreous syneresis       Fundus Exam      Right Left   Disc Pink and Sharp, +cupping Pink and Sharp, +cupping, mild PPP   C/D Ratio 0.6 0.6   Macula Flat, Good foveal reflex, RPE mottling, No heme or edema Flat, Good foveal reflex, RPE mottling, No heme or edema   Vessels mild attenuation, mild AV crossing changes mild attenuation   Periphery Attached, mild White without pressure temporally Attached, mild White without pressure        Refraction    Wearing Rx      Sphere Cylinder Axis Add   Right +1.00 +1.00 056 +1.25   Left +0.75 +1.25 132 +1.25          IMAGING AND PROCEDURES  Imaging and Procedures for 01/29/2020  OCT, Retina - OU - Both Eyes       Right Eye Quality was good. Central Foveal Thickness: 237. Progression has been stable. Findings include normal foveal contour, no IRF, no SRF, vitreomacular adhesion .   Left Eye Quality was good. Central Foveal Thickness: 229. Progression has been stable. Findings include normal foveal contour, no IRF, no SRF, vitreomacular adhesion .   Notes *Images captured and stored on drive  Diagnosis / Impression:  NFP; no  IRF/SRF  Clinical management:  See below  Abbreviations: NFP - Normal foveal profile. CME - cystoid macular edema. PED - pigment epithelial detachment. IRF - intraretinal fluid. SRF - subretinal fluid. EZ - ellipsoid zone. ERM - epiretinal membrane. ORA - outer retinal atrophy. ORT - outer retinal tubulation. SRHM - subretinal hyper-reflective material. IRHM - intraretinal hyper-reflective material        Fluorescein Angiography Optos (Transit OD)       Right Eye   Progression has no prior data. Early phase findings include normal observations. Mid/Late phase findings include leakage (Mild focal perivascular leakage temporal periphery).   Left Eye   Progression has no prior data. Early phase findings include normal observations. Mid/Late phase findings include leakage (Mild focal perivascular leakage temporal periphery).   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 Retinal edema  H35.81 OCT, Retina - OU - Both Eyes  3. Essential hypertension  I10   4. Hypertensive retinopathy of both eyes  H35.033 Fluorescein Angiography Optos (Transit OD)  5. 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 Anterior uveitis OU  - eye pain, redness, photophobia since Dec 2020, after getting COVID infection (was hospitalized for 2 wks)  - has been managed by [facility]  - underwent limited lab work up on 06/21/19   Quant-Gold, ACE, ANA, HLA-B27 -- all normal   ESR slightly elevated (37)  - initial exam here 8.18.21 -- +conj injection OU (OD > OS); 1-2+ AC cell OU; no evidence of posterior involvement  - today, BCVA 20/40 OD, 20/25 OS and exam shows interval improvement in conj injection and AC cell; pt subjectively reports improvement in pain and  photophobia, but not resolved  - OCT without macular edema OU  - FA 8.25.21 shows Mild focal late perivascular leakage temporal periphery OU -- ?inflammatory vs HTN  - continue   PF q1h while awake OU   prolensa QID OU    Cyclopentolate or tropicamide BID OU    Cosopt BID OU  - IOP 24 and 22 -- likely steroid response  - add brimonidine BID OU  - f/u in  6 days-- DFE/OCT  3,4. Hypertensive retinopathy OU - discussed importance of tight BP control - monitor  5. Diabetes mellitus, type 2 without retinopathy  - The incidence, risk factors for progression, natural history and treatment options for diabetic retinopathy  were discussed with patient.    - The need for close monitoring of blood glucose, blood pressure, and serum lipids, avoiding cigarette or any type of tobacco, and the need for long term follow up was also discussed with patient.  - f/u in 1 year, sooner prn  6. Mixed cataract OU  - The symptoms of cataract, surgical options, and treatments and risks were discussed with patient.  - discussed diagnosis and progression  - not yet visually significant  - monitor for now   Ophthalmic Meds Ordered this visit:  No orders of the defined types were placed in this encounter.     Return in about 6 days (around 02/04/2020) for f/u anterior uvitiis OU, DFE, OCT.  There are no Patient Instructions on file for this visit.   Explained the diagnoses, plan, and follow up with the patient and they expressed understanding.  Patient expressed understanding of the importance of proper follow up care.   This document serves as a record of services personally performed by Gardiner Sleeper, MD, PhD. It was created on their behalf by Roselee Nova, COMT. The creation of this record is the provider's dictation and/or activities during the visit.  Electronically signed by: Roselee Nova, COMT 01/29/20 12:20 PM   This document serves as a record of services personally performed by Gardiner Sleeper, MD, PhD. It was created on their behalf by San Jetty. Owens Shark, OA an ophthalmic technician. The creation of this record is the provider's dictation and/or activities during the visit.    Electronically signed by: San Jetty. Owens Shark, OA 08.25.2021 12:20 PM  Gardiner Sleeper, M.D., Ph.D. Diseases & Surgery of the Retina and Vitreous [facility]  I have reviewed the above documentation for accuracy and completeness, and I agree with the above. Gardiner Sleeper, M.D., Ph.D. 01/29/20 12:20 PM   Abbreviations: M myopia (nearsighted); A astigmatism; H hyperopia (farsighted); P presbyopia; Mrx spectacle prescription;  CTL contact lenses; OD right eye; OS left eye; OU both eyes  XT exotropia; ET esotropia; PEK punctate epithelial keratitis; PEE punctate epithelial erosions; DES dry eye syndrome; MGD meibomian gland dysfunction; ATs artificial tears; PFAT's preservative free artificial tears; [place] nuclear sclerotic cataract; PSC posterior subcapsular cataract; ERM epi-retinal membrane; PVD posterior vitreous detachment; RD retinal detachment; DM diabetes mellitus; DR diabetic retinopathy; NPDR non-proliferative diabetic retinopathy; PDR proliferative diabetic retinopathy; CSME clinically significant macular edema; DME diabetic macular edema; dbh dot blot hemorrhages; CWS cotton wool spot; POAG primary open angle glaucoma; C/D cup-to-disc ratio; HVF humphrey visual field; GVF goldmann visual field; OCT optical coherence tomography; IOP intraocular pressure; BRVO Branch retinal vein occlusion; CRVO central retinal vein occlusion; CRAO central retinal artery occlusion; BRAO branch retinal artery occlusion; RT retinal tear; SB scleral buckle; PPV pars plana vitrectomy; VH Vitreous hemorrhage; PRP panretinal laser photocoagulation; IVK intravitreal kenalog; VMT vitreomacular traction; MH Macular hole;  NVD neovascularization of the disc; NVE neovascularization elsewhere; AREDS age related eye  disease study; ARMD age related macular degeneration; POAG primary open angle glaucoma; EBMD epithelial/anterior basement membrane dystrophy; ACIOL anterior chamber intraocular lens; IOL intraocular lens; PCIOL posterior chamber intraocular lens; Phaco/IOL phacoemulsification with intraocular lens placement; [place] photorefractive keratectomy; LASIK laser assisted in situ keratomileusis; HTN hypertension; DM diabetes mellitus; COPD chronic obstructive pulmonary disease

## 2020-01-29 ENCOUNTER — Other Ambulatory Visit: Payer: Self-pay

## 2020-01-29 ENCOUNTER — Ambulatory Visit (INDEPENDENT_AMBULATORY_CARE_PROVIDER_SITE_OTHER): Payer: 59 | Admitting: Ophthalmology

## 2020-01-29 ENCOUNTER — Encounter (INDEPENDENT_AMBULATORY_CARE_PROVIDER_SITE_OTHER): Payer: Self-pay | Admitting: Ophthalmology

## 2020-01-29 DIAGNOSIS — I10 Essential (primary) hypertension: Secondary | ICD-10-CM | POA: Diagnosis not present

## 2020-01-29 DIAGNOSIS — H209 Unspecified iridocyclitis: Secondary | ICD-10-CM | POA: Diagnosis not present

## 2020-01-29 DIAGNOSIS — E119 Type 2 diabetes mellitus without complications: Secondary | ICD-10-CM

## 2020-01-29 DIAGNOSIS — H25813 Combined forms of age-related cataract, bilateral: Secondary | ICD-10-CM | POA: Diagnosis not present

## 2020-01-29 DIAGNOSIS — H3581 Retinal edema: Secondary | ICD-10-CM

## 2020-01-29 DIAGNOSIS — H35033 Hypertensive retinopathy, bilateral: Secondary | ICD-10-CM

## 2020-01-30 ENCOUNTER — Ambulatory Visit: Payer: 59 | Admitting: Endocrinology

## 2020-01-30 ENCOUNTER — Encounter: Payer: 59 | Admitting: Physical Therapy

## 2020-01-30 ENCOUNTER — Other Ambulatory Visit: Payer: Self-pay

## 2020-01-30 ENCOUNTER — Encounter: Payer: Self-pay | Admitting: Endocrinology

## 2020-01-30 VITALS — BP 116/80 | HR 90 | Ht 66.0 in | Wt 213.8 lb

## 2020-01-30 DIAGNOSIS — Z794 Long term (current) use of insulin: Secondary | ICD-10-CM | POA: Diagnosis not present

## 2020-01-30 DIAGNOSIS — E1165 Type 2 diabetes mellitus with hyperglycemia: Secondary | ICD-10-CM

## 2020-01-30 DIAGNOSIS — E119 Type 2 diabetes mellitus without complications: Secondary | ICD-10-CM

## 2020-01-30 LAB — POCT GLYCOSYLATED HEMOGLOBIN (HGB A1C): Hemoglobin A1C: 6 % — AB (ref 4.0–5.6)

## 2020-01-30 MED ORDER — TRESIBA FLEXTOUCH 100 UNIT/ML ~~LOC~~ SOPN: 20.0000 [IU] | PEN_INJECTOR | Freq: Every day | SUBCUTANEOUS | 0 refills | Status: DC

## 2020-01-30 NOTE — Progress Notes (Signed)
Subjective:    Patient ID: [name], female    DOB: December 02, 1970, 49 y.o.   MRN: [id number]  HPI Pt returns for f/u of diabetes mellitus: DM type: Insulin-requiring type 2 Dx'ed: 4818 Complications: DN Therapy: insulin since 2018, Ozempic, and metformin.   GDM: 1990 and 2004 DKA: never Severe hypoglycemia: never Pancreatitis: never Pancreatic imaging: normal on 2019 CT.  SDOH: none Other: She did not tolerate Jardiance (vaginitis) or Victoza (dysphagia); she takes multiple daily injections (but she takes humalog BID, as she eats 2 meals per day); she works 1st shift.   Interval history: no recent steroids.  Pt says fasting cbg's vary from 91-133.  She wants to take V-GO.  Pt says she eats just 2 meals/day (and therefore takes 2 humalog doses per day).  Past Medical History:  Diagnosis Date  . Allergy   . Anemia   . Anxiety    claustrophobic  . Asthma   . Back pain   . Biceps tendonosis of right shoulder   . Diabetes mellitus 2011   did not start metforfin, losing weight  . Dyspnea   . Fatty liver   . Food allergy   . Headache disorder   . History of concussion   . HLD (hyperlipidemia)   . Hypertension   . IBS (irritable bowel syndrome)   . Infertility, female   . Joint pain   . Lactose intolerance   . Leg edema   . Migraines   . Other specified disorders of thyroid   . Palpitation   . Post-menopausal   . Seborrheic dermatitis   . Shoulder impingement syndrome, right   . Vitamin D deficiency     Past Surgical History:  Procedure Laterality Date  . ABDOMINAL HYSTERECTOMY  2006   heavy menses, endometriosis, l oophrectomy  . BACK SURGERY    . BREAST BIOPSY Right 2018   benign  . BREAST EXCISIONAL BIOPSY Right 2003   benign  . BREAST EXCISIONAL BIOPSY Right 1999   benign  . BREAST SURGERY     right breast x 2 , benign  . HERNIA REPAIR  2003   left inguinal   . LEFT OOPHORECTOMY    . SHOULDER ARTHROSCOPY WITH SUBACROMIAL DECOMPRESSION AND OPEN  ROTATOR C Right 07/14/2016   Procedure: RIGHT SHOULDER ARTHROSCOPY WITH SUBACROMIAL DECOMPRESSION, DISTAL CLAVICLE RESECTION AND MINI OPEN ROTATOR CUFF REPAIR, OPEN BICEP TENDODESIS;  Surgeon: Garald Balding, MD;  Location: [facility];  Service: Orthopedics;  Laterality: Right;  . SHOULDER CLOSED REDUCTION Right 09/08/2016   Procedure: RIGHT CLOSED MANIPULATION SHOULDER;  Surgeon: Garald Balding, MD;  Location: [facility];  Service: Orthopedics;  Laterality: Right;  . TUBAL LIGATION      Social History   Socioeconomic History  . Marital status: Divorced    Spouse name: Not on file  . Number of children: 2  . Years of education: 49  . Highest education level: Master's degree (e.g., MA, MS, MEng, MEd, MSW, MBA)  Occupational History  . 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 Smoking status: Never Smoker  . Smokeless tobacco: Never Used  Vaping Use  . Vaping Use: Never used  Substance and Sexual Activity  . Alcohol use: No  . Drug use: No  . Sexual activity: Not Currently    Partners: Male    Birth control/protection: Surgical  Other Topics Concern  . Not on file  Social History  Narrative   Lives at home with son and daughter.   Right-handed.   1-3 cups caffeine weekly.   Social Determinants of Health   Financial Resource Strain:   . Difficulty of Paying Living Expenses: Not on file  Food Insecurity:   . Worried About Charity fundraiser in the Last Year: Not on file  . Ran Out of Food in the Last Year: Not on file  Transportation Needs:   . Lack of Transportation (Medical): Not on file  . Lack of Transportation (Non-Medical): Not on file  Physical Activity:   . Days of Exercise per Week: Not on file  . Minutes of Exercise per Session: Not on file  Stress:   . Feeling of Stress : Not on file  Social Connections:   . Frequency of Communication with Friends and Family: Not on file  . Frequency of Social  Gatherings with Friends and Family: Not on file  . Attends Religious Services: Not on file  . Active Member of Clubs or Organizations: Not on file  . Attends Archivist Meetings: Not on file  . Marital Status: Not on file  Intimate Partner Violence:   . Fear of Current or Ex-Partner: Not on file  . Emotionally Abused: Not on file  . Physically Abused: Not on file  . Sexually Abused: Not on file    Current Outpatient Medications on File Prior to Visit  Medication Sig Dispense Refill  . albuterol (VENTOLIN HFA) 108 (90 Base) MCG/ACT inhaler Inhale 2 puffs into the lungs every 6 (six) hours as needed for wheezing or shortness of breath. 18 g 0  . ALPRAZolam (XANAX) 0.5 MG tablet Take 1 tablet (0.5 mg total) by mouth 3 (three) times daily as needed (Headache).    [name] aspirin EC 81 MG tablet Take 81 mg by mouth daily.    [name] atorvastatin (LIPITOR) 20 MG tablet Take 1 tablet (20 mg total) by mouth daily at 6 PM.    . benzonatate (TESSALON) 100 MG capsule Take 1 capsule (100 mg total) by mouth 3 (three) times daily as needed for cough. 21 capsule 0  . Blood Glucose Monitoring Suppl (FREESTYLE LITE) DEVI 1 each by Does not apply route 2 (two) times daily. E11.9 1 each 0  . Bromfenac Sodium (PROLENSA) 0.07 % SOLN Place 1 drop into both eyes 4 (four) times daily. 6 mL 3  . butalbital-acetaminophen-caffeine (FIORICET) 50-325-40 MG tablet Take 1 tablet by mouth every 6 (six) hours as needed for headache. Do not refill in less than 30 day 30 tablet 1  . celecoxib (CELEBREX) 200 MG capsule Take 1 capsule (200 mg total) by mouth 2 (two) times daily as needed. 45 capsule 0  . diazepam (VALIUM) 2 MG tablet Take 1 tablet (2 mg total) by mouth every 6 (six) hours as needed (Headache). 30 tablet 5  . diltiazem (CARDIZEM LA) 240 MG 24 hr tablet TAKE 1 TABLET BY MOUTH DAILY 90 tablet 1  . dorzolamide-timolol (COSOPT) 22.3-6.8 MG/ML ophthalmic solution Place 1 drop into both eyes 2 (two) times daily. 10 mL 1   . EPINEPHrine 0.3 mg/0.3 mL IJ SOAJ injection Inject 0.3 mLs (0.3 mg total) into the muscle as needed (for allergic reaction). 1 each 3  . ergocalciferol (DRISDOL) 1.25 MG (50000 UT) capsule Take 1 capsule (50,000 Units total) by mouth once a week. 12 capsule 0  . Fremanezumab-vfrm (AJOVY) 225 MG/1.5ML SOAJ Inject 675 mg into the skin every 3 (three) months. 3 pen  3  . glucose blood (FREESTYLE LITE) test strip 1 each by Other route 2 (two) times daily. E11.9 200 each 0  . hydrochlorothiazide (MICROZIDE) 12.5 MG capsule TAKE 1 CAPSULE BY MOUTH DAILY. 90 capsule 0  . insulin lispro (HUMALOG KWIKPEN) 100 UNIT/ML KwikPen Inject 0.2 mLs (20 Units total) into the skin 2 (two) times daily with a meal. 67.5 mL 0  . Insulin Pen Needle (PEN NEEDLES 31GX5/16") 31G X 8 MM MISC 1 each by Does not apply route in the morning, at noon, in the evening, and at bedtime. E11.9 360 each 0  . ISOPTO ATROPINE 1 % ophthalmic solution     . ketorolac (TORADOL) 10 MG tablet Take 1 tablet (10 mg total) by mouth every 6 (six) hours as needed. (Patient taking differently: Take 10 mg by mouth every 6 (six) hours as needed (Headache). ) 30 tablet 1  . Lancets (FREESTYLE) lancets 1 each by Other route 2 (two) times daily. E11.9 200 each 0  . losartan-hydrochlorothiazide (HYZAAR) 50-12.5 MG tablet Take 1 tablet by mouth daily. 90 tablet 3  . metFORMIN (GLUCOPHAGE-XR) 500 MG 24 hr tablet Take 2 tablets (1,000 mg total) by mouth 2 (two) times daily. (Patient taking differently: Take 1,000 mg by mouth daily. ) 360 tablet 2  . metoprolol succinate (TOPROL-XL) 25 MG 24 hr tablet TAKE 1/2 TABLET BY MOUTH DAILY. 45 tablet 2  . ondansetron (ZOFRAN ODT) 4 MG disintegrating tablet Take 1 tablet (4 mg total) by mouth every 8 (eight) hours as needed for nausea or vomiting. 20 tablet 0  . oxyCODONE-acetaminophen (PERCOCET/ROXICET) 5-325 MG tablet Take by mouth as needed for severe pain.    . prednisoLONE acetate (PRED FORTE) 1 % ophthalmic  suspension Place 1 drop into both eyes every hour. 15 mL 0  . Semaglutide, 1 MG/DOSE, (OZEMPIC, 1 MG/DOSE,) 2 MG/1.5ML SOPN Inject 0.75 mLs (1 mg total) into the skin once a week. 3 pen 3   No current facility-administered medications on file prior to visit.    Allergies  Allergen Reactions  . Bamlanivimab Anaphylaxis, Itching, Palpitations, Rash, Shortness Of Breath and Swelling  . Botox [Onabotulinumtoxina] Shortness Of Breath    syncope  . Clindamycin/Lincomycin Other (See Comments)  . Kiwi Extract Shortness Of Breath  . Maxalt [Rizatriptan Benzoate] Anaphylaxis    Chest pain  . Nitrous Oxide Shortness Of Breath    syncope  . Triptans Shortness Of Breath  . Aspirin Nausea And Vomiting    Mouth blisters  . Contrast Media [Iodinated Diagnostic Agents]   . Flagyl [Metronidazole]   . Hydrocodone-Acetaminophen Other (See Comments)  . Latex     Sometimes causes rash  . Mango Flavor   . Rizatriptan Other (See Comments)  . Vicodin [Hydrocodone-Acetaminophen]     hallucinations  . Flu Virus Vaccine Palpitations  . Penicillins Nausea And Vomiting, Rash and Other (See Comments)    Did it involve swelling of the face/tongue/throat, SOB, or low BP? No Did it involve sudden or severe rash/hives, skin peeling, or any reaction on the inside of your mouth or nose? No Did you need to seek medical attention at a hospital or doctor's office? Yes When did it last happen?patient was 49 years old If all above answers are "NO", may proceed with cephalosporin use.  . Tetracyclines & Related Nausea And Vomiting and Rash    Family History  Problem Relation Age of Onset  . Diabetes Mother   . Heart disease Mother 73  . Hypertension Mother   .  Hyperlipidemia Mother   . Heart failure Mother   . Stroke Mother   . Thyroid disease Mother   . Depression Mother   . Sleep apnea Mother   . Obesity Mother   . Cancer Father 27       Lung Cancer  . Heart disease Father   . Mental illness Sister         bipolar, substance abuse,  clean 2 yrs  . Hypertension Sister   . Cancer Paternal Grandmother 63       breast cancer  . Breast cancer Paternal Grandmother   . Diabetes Maternal Grandmother   . Hypertension Maternal Grandmother   . Diabetes Son     BP 116/80   Pulse 90   Ht 5\' 6"  (1.676 m)   Wt 213 lb 12.8 oz (97 kg)   SpO2 98%   BMI 34.51 kg/m    Review of Systems She denies hypoglycemia.      Objective:   Physical Exam VITAL SIGNS:  See vs page GENERAL: no distress Pulses: dorsalis pedis intact bilat.   MSK: no deformity of the feet CV: no leg edema Skin:  no ulcer on the feet.  normal color and temp on the feet. Neuro: sensation is intact to touch on the feet  Lab Results  Component Value Date   HGBA1C 6.6 (A) 12/02/2019       Assessment & Plan:  Insulin-requiring type 2 DM: overcontrolled  Patient Instructions  Please see Vaughan Basta, to et started on the V-GO., and: Please reduce the Tresiba to 20 units daily, and:  Please continue the same Humalog.  check your blood sugar twice a day.  vary the time of day when you check, between before the 3 meals, and at bedtime.  also check if you have symptoms of your blood sugar being too high or too low.  please keep a record of the readings and bring it to your next appointment here (or you can bring the meter itself).  You can write it on any piece of paper.  please call us sooner if your blood sugar goes below 70, or if you have a lot of readings over 200.  Please come back for a follow-up appointment in approx 2-3 months.

## 2020-01-30 NOTE — Patient Instructions (Addendum)
Please see [name], to et started on the V-GO., and: Please reduce the Tresiba to 20 units daily, and:  Please continue the same Humalog.  check your blood sugar twice a day.  vary the time of day when you check, between before the 3 meals, and at bedtime.  also check if you have symptoms of your blood sugar being too high or too low.  please keep a record of the readings and bring it to your next appointment here (or you can bring the meter itself).  You can write it on any piece of paper.  please call us sooner if your blood sugar goes below 70, or if you have a lot of readings over 200.  Please come back for a follow-up appointment in approx 2-3 months.

## 2020-02-03 ENCOUNTER — Encounter: Payer: 59 | Admitting: Physical Therapy

## 2020-02-03 NOTE — Progress Notes (Signed)
[facility] Note  02/05/2020     CHIEF COMPLAINT Patient presents for Retina Follow Up   HISTORY OF PRESENT ILLNESS: [name] is a 49 y.o. 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  Severity is moderate.  Duration of 6 days.  Since onset it is stable.  I, the attending physician,  performed the HPI with the patient and updated documentation appropriately.          Comments    Patient states having pressure sensation OU. Today, pressure sensation is in OS, but can alternate OD and OS. Vision seems the same OU. Using Pred Forte every 2 hours while awake, prolensa qid OU, cyclogyl bid OU, cosopt bid OU, and brimonidine bid OU.       Last edited by Bernarda Caffey, MD on 02/05/2020  1:23 PM. (History)    pt states she feels like she is getting better, she states she has some pressure behind her eyes today, she states she did not work last [employer] or [employer] and only a few hours on Fri, she feels like if she takes frequent breaks her eyes are better, she says her light sensitivity is better, she is using PF q2h, she has been using Tropicamide instead of Cyclo, but states it burns, she is also using Cosopt and brim BID OU, she has not been using Prolensa QID, but occasionally  Referring physician: Crecencio Mc, MD [address] [place],  [place] [address]  HISTORICAL INFORMATION:   Selected notes from the [facility] referral for anterior uveitis LEE:  Ocular Hx- PMH-    CURRENT MEDICATIONS: Current Outpatient Medications (Ophthalmic Drugs)  Medication Sig  . brimonidine (ALPHAGAN) 0.15 % ophthalmic solution Place 1 drop into both eyes in the morning and at bedtime.  . Bromfenac Sodium (PROLENSA) 0.07 % SOLN Place 1 drop into both eyes 4 (four) times daily.  . cyclopentolate (CYCLODRYL,CYCLOGYL) 1 % ophthalmic solution 1 drop 2  (two) times daily.  . dorzolamide-timolol (COSOPT) 22.3-6.8 MG/ML ophthalmic solution Place 1 drop into both eyes 2 (two) times daily.  . ISOPTO ATROPINE 1 % ophthalmic solution   . prednisoLONE acetate (PRED FORTE) 1 % ophthalmic suspension Place 1 drop into both eyes every hour. (Patient taking differently: Place 1 drop into both eyes every 2 (two) hours while awake. )   No current facility-administered medications for this visit. (Ophthalmic Drugs)   Current Outpatient Medications (Other)  Medication Sig  . albuterol (VENTOLIN HFA) 108 (90 Base) MCG/ACT inhaler Inhale 2 puffs into the lungs every 6 (six) hours as needed for wheezing or shortness of breath.  . ALPRAZolam (XANAX) 0.5 MG tablet Take 1 tablet (0.5 mg total) by mouth 3 (three) times daily as needed (Headache).  [name] aspirin EC 81 MG tablet Take 81 mg by mouth daily.  [name] atorvastatin (LIPITOR) 20 MG tablet Take 1 tablet (20 mg total) by mouth daily at 6 PM.  . benzonatate (TESSALON) 100 MG capsule Take 1 capsule (100 mg total) by mouth 3 (three) times daily as needed for cough.  . Blood Glucose Monitoring Suppl (FREESTYLE LITE) DEVI 1 each by Does not apply route 2 (two) times daily. E11.9  . butalbital-acetaminophen-caffeine (FIORICET) 50-325-40 MG tablet Take 1 tablet by mouth every 6 (six) hours as needed for headache. Do not refill  in less than 30 day  . celecoxib (CELEBREX) 200 MG capsule Take 1 capsule (200 mg total) by mouth 2 (two) times daily as needed.  . diazepam (VALIUM) 2 MG tablet Take 1 tablet (2 mg total) by mouth every 6 (six) hours as needed (Headache).  . diltiazem (CARDIZEM LA) 240 MG 24 hr tablet TAKE 1 TABLET BY MOUTH DAILY  . EPINEPHrine 0.3 mg/0.3 mL IJ SOAJ injection Inject 0.3 mLs (0.3 mg total) into the muscle as needed (for allergic reaction).  [name] ergocalciferol (DRISDOL) 1.25 MG (50000 UT) capsule Take 1 capsule (50,000 Units total) by mouth once a week.  . Fremanezumab-vfrm (AJOVY) 225 MG/1.5ML SOAJ Inject  675 mg into the skin every 3 (three) months.  [name] glucose blood (FREESTYLE LITE) test strip 1 each by Other route 2 (two) times daily. E11.9  . hydrochlorothiazide (MICROZIDE) 12.5 MG capsule TAKE 1 CAPSULE BY MOUTH DAILY.  [name] insulin degludec (TRESIBA FLEXTOUCH) 100 UNIT/ML FlexTouch Pen Inject 0.2 mLs (20 Units total) into the skin daily.  . insulin lispro (HUMALOG KWIKPEN) 100 UNIT/ML KwikPen Inject 0.2 mLs (20 Units total) into the skin 2 (two) times daily with a meal.  . Insulin Pen Needle (PEN NEEDLES 31GX5/16") 31G X 8 MM MISC 1 each by Does not apply route in the morning, at noon, in the evening, and at bedtime. E11.9  . ketorolac (TORADOL) 10 MG tablet Take 1 tablet (10 mg total) by mouth every 6 (six) hours as needed. (Patient taking differently: Take 10 mg by mouth every 6 (six) hours as needed (Headache). )  . Lancets (FREESTYLE) lancets 1 each by Other route 2 (two) times daily. E11.9  . losartan-hydrochlorothiazide (HYZAAR) 50-12.5 MG tablet Take 1 tablet by mouth daily.  . metFORMIN (GLUCOPHAGE-XR) 500 MG 24 hr tablet Take 2 tablets (1,000 mg total) by mouth 2 (two) times daily. (Patient taking differently: Take 1,000 mg by mouth daily. )  . metoprolol succinate (TOPROL-XL) 25 MG 24 hr tablet TAKE 1/2 TABLET BY MOUTH DAILY.  [name] ondansetron (ZOFRAN ODT) 4 MG disintegrating tablet Take 1 tablet (4 mg total) by mouth every 8 (eight) hours as needed for nausea or vomiting.  [name] oxyCODONE-acetaminophen (PERCOCET/ROXICET) 5-325 MG tablet Take by mouth as needed for severe pain.  . Semaglutide, 1 MG/DOSE, (OZEMPIC, 1 MG/DOSE,) 2 MG/1.5ML SOPN Inject 0.75 mLs (1 mg total) into the skin once a week.   No current facility-administered medications for this visit. (Other)      REVIEW OF SYSTEMS: ROS    Positive for: Neurological, Musculoskeletal, Endocrine, Cardiovascular, Eyes, Psychiatric   Negative for: Constitutional, Gastrointestinal, Skin, Genitourinary, HENT, Respiratory, Allergic/Imm,  Heme/Lymph   Last edited by Roselee Nova D, COT on 02/05/2020 10:17 AM. (History)       ALLERGIES Allergies  Allergen Reactions  . Bamlanivimab Anaphylaxis, Itching, Palpitations, Rash, Shortness Of Breath and Swelling  . Botox [Onabotulinumtoxina] Shortness Of Breath    syncope  . Clindamycin/Lincomycin Other (See Comments)  . Kiwi Extract Shortness Of Breath  . Maxalt [Rizatriptan Benzoate] Anaphylaxis    Chest pain  . Nitrous Oxide Shortness Of Breath    syncope  . Triptans Shortness Of Breath  . Aspirin Nausea And Vomiting    Mouth blisters  . Contrast Media [Iodinated Diagnostic Agents]   . Flagyl [Metronidazole]   . Hydrocodone-Acetaminophen Other (See Comments)  . Latex     Sometimes causes rash  . Mango Flavor   . Rizatriptan Other (See Comments)  . Vicodin [Hydrocodone-Acetaminophen]  hallucinations  . Flu Virus Vaccine Palpitations  . Penicillins Nausea And Vomiting, Rash and Other (See Comments)    Did it involve swelling of the face/tongue/throat, SOB, or low BP? No Did it involve sudden or severe rash/hives, skin peeling, or any reaction on the inside of your mouth or nose? No Did you need to seek medical attention at a hospital or doctor's office? Yes When did it last happen?patient was 49 years old If all above answers are "NO", may proceed with cephalosporin use.  . Tetracyclines & Related Nausea And Vomiting and Rash    PAST MEDICAL HISTORY Past Medical History:  Diagnosis Date  . Allergy   . Anemia   . Anxiety    claustrophobic  . Asthma   . Back pain   . Biceps tendonosis of right shoulder   . Diabetes mellitus 2011   did not start metforfin, losing weight  . Dyspnea   . Fatty liver   . Food allergy   . Headache disorder   . History of concussion   . HLD (hyperlipidemia)   . Hypertension   . IBS (irritable bowel syndrome)   . Infertility, female   . Joint pain   . Lactose intolerance   . Leg edema   . Migraines   . Other  specified disorders of thyroid   . Palpitation   . Post-menopausal   . Seborrheic dermatitis   . Shoulder impingement syndrome, right   . Vitamin D deficiency    Past Surgical History:  Procedure Laterality Date  . ABDOMINAL HYSTERECTOMY  2006   heavy menses, endometriosis, l oophrectomy  . BACK SURGERY    . BREAST BIOPSY Right 2018   benign  . BREAST EXCISIONAL BIOPSY Right 2003   benign  . BREAST EXCISIONAL BIOPSY Right 1999   benign  . BREAST SURGERY     right breast x 2 , benign  . HERNIA REPAIR  2003   left inguinal   . LEFT OOPHORECTOMY    . SHOULDER ARTHROSCOPY WITH SUBACROMIAL DECOMPRESSION AND OPEN ROTATOR C Right 07/14/2016   Procedure: RIGHT SHOULDER ARTHROSCOPY WITH SUBACROMIAL DECOMPRESSION, DISTAL CLAVICLE RESECTION AND MINI OPEN ROTATOR CUFF REPAIR, OPEN BICEP TENDODESIS;  Surgeon: Garald Balding, MD;  Location: [facility];  Service: Orthopedics;  Laterality: Right;  . SHOULDER CLOSED REDUCTION Right 09/08/2016   Procedure: RIGHT CLOSED MANIPULATION SHOULDER;  Surgeon: Garald Balding, MD;  Location: [facility];  Service: Orthopedics;  Laterality: Right;  . TUBAL LIGATION      FAMILY HISTORY Family History  Problem Relation Age of Onset  . Diabetes Mother   . Heart disease Mother 64  . Hypertension Mother   . Hyperlipidemia Mother   . Heart failure Mother   . Stroke Mother   . Thyroid disease Mother   . Depression Mother   . Sleep apnea Mother   . Obesity Mother   . Cancer Father 4       Lung Cancer  . Heart disease Father   . Mental illness Sister        bipolar, substance abuse,  clean 2 yrs  . Hypertension Sister   . Cancer Paternal Grandmother 86       breast cancer  . Breast cancer Paternal Grandmother   . Diabetes Maternal Grandmother   . Hypertension Maternal Grandmother   . Diabetes Son     SOCIAL HISTORY Social History   Tobacco Use  . Smoking status: Never Smoker  .  Smokeless tobacco: Never Used   Vaping Use  . Vaping Use: Never used  Substance Use Topics  . Alcohol use: No  . Drug use: No         OPHTHALMIC EXAM:  Base Eye Exam    Visual Acuity (Snellen - Linear)      Right Left   Dist cc 20/50 +1 20/30   Dist ph cc 20/25 20/25 -2       Tonometry (Tonopen, 10:31 AM)      Right Left   Pressure 22 22       Pupils      Dark Light Shape React APD   Right 8 8 Round None None   Left 8 8 Round None None       Visual Fields (Counting fingers)      Left Right    Full Full       Extraocular Movement      Right Left    Full, Ortho Full, Ortho       Neuro/Psych    Oriented x3: Yes   Mood/Affect: Normal       Dilation    Both eyes: 1.0% Mydriacyl, 2.5% Phenylephrine @ 10:31 AM        Slit Lamp and Fundus Exam    Slit Lamp Exam      Right Left   Lids/Lashes Normal Normal   Conjunctiva/Sclera Melanosis Melanosis   Cornea arcus, 1+inferior Punctate epithelial erosions, Debris in tear film - inferiorly arcus, 1+ inferior Punctate epithelial erosions, Debris in tear film   Anterior Chamber Deep, 0.5+Cell Deep, 0.5+Cell, mild flare   Iris Round and dilated Round and dilated   Lens 2+ Nuclear sclerosis, 2+ Cortical cataract 2+ Nuclear sclerosis, 2+ Cortical cataract   Vitreous no cell or vitritis, Vitreous syneresis no cell or vitritis, Vitreous syneresis       Fundus Exam      Right Left   Disc Pink and Sharp, +cupping Pink and Sharp, +cupping, mild PPP   C/D Ratio 0.6 0.6   Macula Flat, Good foveal reflex, RPE mottling, No heme or edema Flat, Good foveal reflex, RPE mottling, No heme or edema   Vessels mild attenuation, mild AV crossing changes mild attenuation   Periphery Attached, mild White without pressure temporally Attached, mild White without pressure          IMAGING AND PROCEDURES  Imaging and Procedures for 02/05/2020  OCT, Retina - OU - Both Eyes       Right Eye Quality was good. Central Foveal Thickness: 241. Progression has been  stable. Findings include normal foveal contour, no IRF, no SRF, vitreomacular adhesion .   Left Eye Quality was good. Central Foveal Thickness: 239. Progression has been stable. Findings include normal foveal contour, no IRF, no SRF, vitreomacular adhesion .   Notes *Images captured and stored on drive  Diagnosis / Impression:  NFP; no IRF/SRF  Clinical management:  See below  Abbreviations: NFP - Normal foveal profile. CME - cystoid macular edema. PED - pigment epithelial detachment. IRF - intraretinal fluid. SRF - subretinal fluid. EZ - ellipsoid zone. ERM - epiretinal membrane. ORA - outer retinal atrophy. ORT - outer retinal tubulation. SRHM - subretinal hyper-reflective material. 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 Retinal edema  H35.81 OCT, Retina - OU - Both Eyes  3. Essential hypertension  I10   4. Hypertensive retinopathy of both eyes  H35.033   5. Diabetes mellitus type 2 without retinopathy ([place])  E11.9   6. Combined forms of age-related cataract of both eyes  H25.813     1,2. Anterior uveitis OU  - eye pain, redness, photophobia since Dec 2020, after getting COVID infection (was hospitalized for 2 wks)  - has been [facility]  - underwent limited lab work up on 06/21/19   Quant-Gold, ACE, ANA, HLA-B27 -- all normal   ESR slightly elevated (37)  - initial exam here 08.18.21 -- +conj injection OU (OD > OS); 1-2+ AC cell OU; no evidence of posterior involvement  - today, BCVA 20/25 OU and exam shows interval improvement in conj injection; Persistent mild AC cell; pt subjectively reports improvement in pain and photophobia, but not resolved  - OCT without macular edema OU  - FA 08.25.21 shows Mild focal late perivascular leakage temporal periphery OU -- ?inflammatory vs HTN  - continue   PF q2h while awake OU   prolensa QID OU    Cyclopentolate or  tropicamide BID OU    Cosopt BID OU   Brimonidine BID OU  - IOP 22 OU -- likely steroid response  - f/u in  1-2 weeks-- DFE/OCT  3,4. Hypertensive retinopathy OU - discussed importance of tight BP control - monitor  5. Diabetes mellitus, type 2 without retinopathy  - The incidence, risk factors for progression, natural history and treatment options for diabetic retinopathy  were discussed with patient.    - The need for close monitoring of blood glucose, blood pressure, and serum lipids, avoiding cigarette or any type of tobacco, and the need for long term follow up was also discussed with patient.  - f/u in 1 year, sooner prn  6. Mixed cataract OU  - The symptoms of cataract, surgical options, and treatments and risks were discussed with patient.  - discussed diagnosis and progression  - not yet visually significant  - monitor for now   Ophthalmic Meds Ordered this visit:  No orders of the defined types were placed in this encounter.     Return for f/u 1-2 weeks, uveitis OU, DFE, OCT.  There are no Patient Instructions on file for this visit.   Explained the diagnoses, plan, and follow up with the patient and they expressed understanding.  Patient expressed understanding of the importance of proper follow up care.   This document serves as a record of services personally performed by Gardiner Sleeper, MD, PhD. It was created on their behalf by Roselee Nova, COMT. The creation of this record is the provider's dictation and/or activities during the visit.  Electronically signed by: Roselee Nova, COMT 02/05/20 1:26 PM  This document serves as a record of services personally performed by Gardiner Sleeper, MD, PhD. It was created on their behalf by San Jetty. Owens Shark, OA an ophthalmic technician. The creation of this record is the provider's dictation and/or activities during the visit.    Electronically signed by: San Jetty. [place], [place] 09.01.2021 1:26 PM  Gardiner Sleeper, M.D.,  Ph.D. Diseases & Surgery of the Retina and Vitreous [facility]  I have reviewed the above documentation for accuracy and completeness, and I agree with the above. Gardiner Sleeper, M.D., Ph.D. 02/05/20 1:26 PM   Abbreviations: M myopia (nearsighted); A astigmatism; H hyperopia (farsighted); P presbyopia; Mrx spectacle prescription;  CTL contact lenses; OD  right eye; OS left eye; OU both eyes  XT exotropia; ET esotropia; PEK punctate epithelial keratitis; PEE punctate epithelial erosions; DES dry eye syndrome; MGD meibomian gland dysfunction; ATs artificial tears; PFAT's preservative free artificial tears; [place] nuclear sclerotic cataract; PSC posterior subcapsular cataract; ERM epi-retinal membrane; PVD posterior vitreous detachment; RD retinal detachment; DM diabetes mellitus; DR diabetic retinopathy; NPDR non-proliferative diabetic retinopathy; PDR proliferative diabetic retinopathy; CSME clinically significant macular edema; DME diabetic macular edema; dbh dot blot hemorrhages; CWS cotton wool spot; POAG primary open angle glaucoma; C/D cup-to-disc ratio; HVF humphrey visual field; GVF goldmann visual field; OCT optical coherence tomography; IOP intraocular pressure; BRVO Branch retinal vein occlusion; CRVO central retinal vein occlusion; CRAO central retinal artery occlusion; BRAO branch retinal artery occlusion; RT retinal tear; SB scleral buckle; PPV pars plana vitrectomy; VH Vitreous hemorrhage; PRP panretinal laser photocoagulation; IVK intravitreal kenalog; VMT vitreomacular traction; MH Macular hole;  NVD neovascularization of the disc; NVE neovascularization elsewhere; AREDS age related eye disease study; ARMD age related macular degeneration; POAG primary open angle glaucoma; EBMD epithelial/anterior basement membrane dystrophy; ACIOL anterior chamber intraocular lens; IOL intraocular lens; PCIOL posterior chamber intraocular lens; Phaco/IOL phacoemulsification with  intraocular lens placement; [place] photorefractive keratectomy; LASIK laser assisted in situ keratomileusis; HTN hypertension; DM diabetes mellitus; COPD chronic obstructive pulmonary disease

## 2020-02-05 ENCOUNTER — Ambulatory Visit (INDEPENDENT_AMBULATORY_CARE_PROVIDER_SITE_OTHER): Payer: 59 | Admitting: Ophthalmology

## 2020-02-05 ENCOUNTER — Encounter (INDEPENDENT_AMBULATORY_CARE_PROVIDER_SITE_OTHER): Payer: Self-pay | Admitting: Ophthalmology

## 2020-02-05 ENCOUNTER — Other Ambulatory Visit: Payer: Self-pay

## 2020-02-05 DIAGNOSIS — H25813 Combined forms of age-related cataract, bilateral: Secondary | ICD-10-CM | POA: Diagnosis not present

## 2020-02-05 DIAGNOSIS — E119 Type 2 diabetes mellitus without complications: Secondary | ICD-10-CM

## 2020-02-05 DIAGNOSIS — H3581 Retinal edema: Secondary | ICD-10-CM | POA: Diagnosis not present

## 2020-02-05 DIAGNOSIS — H209 Unspecified iridocyclitis: Secondary | ICD-10-CM

## 2020-02-05 DIAGNOSIS — I10 Essential (primary) hypertension: Secondary | ICD-10-CM | POA: Diagnosis not present

## 2020-02-05 DIAGNOSIS — H35033 Hypertensive retinopathy, bilateral: Secondary | ICD-10-CM | POA: Diagnosis not present

## 2020-02-13 NOTE — Progress Notes (Signed)
[facility] Note  02/18/2020     CHIEF COMPLAINT Patient presents for Retina Follow Up   HISTORY OF PRESENT ILLNESS: [name] is a 49 y.o. 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  Severity is moderate.  Duration of 2 weeks.  Course: fluctuates.  I, the attending physician,  performed the HPI with the patient and updated documentation appropriately.          Comments    Patient states vision and sensitivity to light fluctuates. Overcast days are worse than bright, sunny days. Using all gtts as instructed. Last a1c was 6.0 last month.        Last edited by Bernarda Caffey, MD on 02/19/2020 12:09 PM. (History)    pt states her eyes are very light sensitive today, she states she has been working 12-13 hrs a day, 6-7 days a week, pt is using tropicamide instead of cyclo  Referring physician: Crecencio Mc, MD [address] [place],  [place] [address]  HISTORICAL INFORMATION:   Selected notes from the [facility] referral for anterior uveitis    CURRENT MEDICATIONS: Current Outpatient Medications (Ophthalmic Drugs)  Medication Sig  . brimonidine (ALPHAGAN) 0.15 % ophthalmic solution Place 1 drop into both eyes in the morning and at bedtime.  . Bromfenac Sodium (PROLENSA) 0.07 % SOLN Place 1 drop into both eyes 4 (four) times daily.  . cyclopentolate (CYCLODRYL,CYCLOGYL) 1 % ophthalmic solution 1 drop 2 (two) times daily.  . dorzolamide-timolol (COSOPT) 22.3-6.8 MG/ML ophthalmic solution Place 1 drop into both eyes 2 (two) times daily.  . ISOPTO ATROPINE 1 % ophthalmic solution   . prednisoLONE acetate (PRED FORTE) 1 % ophthalmic suspension Place 1 drop into both eyes every hour. (Patient taking differently: Place 1 drop into both eyes every 2 (two) hours while awake. )   No current facility-administered medications for  this visit. (Ophthalmic Drugs)   Current Outpatient Medications (Other)  Medication Sig  . albuterol (VENTOLIN HFA) 108 (90 Base) MCG/ACT inhaler Inhale 2 puffs into the lungs every 6 (six) hours as needed for wheezing or shortness of breath.  . ALPRAZolam (XANAX) 0.5 MG tablet Take 1 tablet (0.5 mg total) by mouth 3 (three) times daily as needed (Headache).  [name] aspirin EC 81 MG tablet Take 81 mg by mouth daily.  [name] atorvastatin (LIPITOR) 20 MG tablet Take 1 tablet (20 mg total) by mouth daily at 6 PM.  . benzonatate (TESSALON) 100 MG capsule Take 1 capsule (100 mg total) by mouth 3 (three) times daily as needed for cough.  . Blood Glucose Monitoring Suppl (FREESTYLE LITE) DEVI 1 each by Does not apply route 2 (two) times daily. E11.9  . butalbital-acetaminophen-caffeine (FIORICET) 50-325-40 MG tablet Take 1 tablet by mouth every 6 (six) hours as needed for headache. Do not refill in less than 30 day  . celecoxib (CELEBREX) 200 MG capsule Take 1 capsule (200 mg total) by mouth 2 (two) times daily as needed.  . diazepam (VALIUM) 2 MG tablet Take 1 tablet (2 mg total) by mouth every 6 (six) hours as needed (Headache).  . diltiazem (CARDIZEM LA) 240 MG 24 hr tablet TAKE 1 TABLET BY MOUTH DAILY  . EPINEPHrine 0.3 mg/0.3 mL IJ SOAJ injection Inject 0.3 mLs (0.3 mg total) into the muscle as needed (  for allergic reaction).  [name] ergocalciferol (DRISDOL) 1.25 MG (50000 UT) capsule Take 1 capsule (50,000 Units total) by mouth once a week.  . Fremanezumab-vfrm (AJOVY) 225 MG/1.5ML SOAJ Inject 675 mg into the skin every 3 (three) months.  [name] glucose blood (FREESTYLE LITE) test strip 1 each by Other route 2 (two) times daily. E11.9  . hydrochlorothiazide (MICROZIDE) 12.5 MG capsule TAKE 1 CAPSULE BY MOUTH DAILY.  [name] insulin degludec (TRESIBA FLEXTOUCH) 100 UNIT/ML FlexTouch Pen Inject 0.2 mLs (20 Units total) into the skin daily.  . insulin lispro (HUMALOG KWIKPEN) 100 UNIT/ML KwikPen Inject 0.2 mLs (20 Units  total) into the skin 2 (two) times daily with a meal.  . Insulin Pen Needle (PEN NEEDLES 31GX5/16") 31G X 8 MM MISC 1 each by Does not apply route in the morning, at noon, in the evening, and at bedtime. E11.9  . ketorolac (TORADOL) 10 MG tablet Take 1 tablet (10 mg total) by mouth every 6 (six) hours as needed. (Patient taking differently: Take 10 mg by mouth every 6 (six) hours as needed (Headache). )  . Lancets (FREESTYLE) lancets 1 each by Other route 2 (two) times daily. E11.9  . losartan-hydrochlorothiazide (HYZAAR) 50-12.5 MG tablet Take 1 tablet by mouth daily.  . metFORMIN (GLUCOPHAGE-XR) 500 MG 24 hr tablet Take 2 tablets (1,000 mg total) by mouth 2 (two) times daily. (Patient taking differently: Take 1,000 mg by mouth daily. )  . metoprolol succinate (TOPROL-XL) 25 MG 24 hr tablet TAKE 1/2 TABLET BY MOUTH DAILY.  [name] ondansetron (ZOFRAN ODT) 4 MG disintegrating tablet Take 1 tablet (4 mg total) by mouth every 8 (eight) hours as needed for nausea or vomiting.  [name] oxyCODONE-acetaminophen (PERCOCET/ROXICET) 5-325 MG tablet Take by mouth as needed for severe pain.  . Semaglutide, 1 MG/DOSE, (OZEMPIC, 1 MG/DOSE,) 2 MG/1.5ML SOPN Inject 0.75 mLs (1 mg total) into the skin once a week.   No current facility-administered medications for this visit. (Other)      REVIEW OF SYSTEMS: ROS    Positive for: Neurological, Musculoskeletal, Endocrine, Cardiovascular, Eyes, Psychiatric   Negative for: Constitutional, Gastrointestinal, Skin, Genitourinary, HENT, Respiratory, Allergic/Imm, Heme/Lymph   Last edited by Roselee Nova D, COT on 02/18/2020  1:22 PM. (History)       ALLERGIES Allergies  Allergen Reactions  . Bamlanivimab Anaphylaxis, Itching, Palpitations, Rash, Shortness Of Breath and Swelling  . Botox [Onabotulinumtoxina] Shortness Of Breath    syncope  . Clindamycin/Lincomycin Other (See Comments)  . Kiwi Extract Shortness Of Breath  . Maxalt [Rizatriptan Benzoate] Anaphylaxis     Chest pain  . Nitrous Oxide Shortness Of Breath    syncope  . Triptans Shortness Of Breath  . Aspirin Nausea And Vomiting    Mouth blisters  . Contrast Media [Iodinated Diagnostic Agents]   . Flagyl [Metronidazole]   . Hydrocodone-Acetaminophen Other (See Comments)  . Latex     Sometimes causes rash  . Mango Flavor   . Rizatriptan Other (See Comments)  . Vicodin [Hydrocodone-Acetaminophen]     hallucinations  . Flu Virus Vaccine Palpitations  . Penicillins Nausea And Vomiting, Rash and Other (See Comments)    Did it involve swelling of the face/tongue/throat, SOB, or low BP? No Did it involve sudden or severe rash/hives, skin peeling, or any reaction on the inside of your mouth or nose? No Did you need to seek medical attention at a hospital or doctor's office? Yes When did it last happen?patient was 49 years old If all above answers are "  NO", may proceed with cephalosporin use.  . Tetracyclines & Related Nausea And Vomiting and Rash    PAST MEDICAL HISTORY Past Medical History:  Diagnosis Date  . Allergy   . Anemia   . Anxiety    claustrophobic  . Asthma   . Back pain   . Biceps tendonosis of right shoulder   . Diabetes mellitus 2011   did not start metforfin, losing weight  . Dyspnea   . Fatty liver   . Food allergy   . Headache disorder   . History of concussion   . HLD (hyperlipidemia)   . Hypertension   . IBS (irritable bowel syndrome)   . Infertility, female   . Joint pain   . Lactose intolerance   . Leg edema   . Migraines   . Other specified disorders of thyroid   . Palpitation   . Post-menopausal   . Seborrheic dermatitis   . Shoulder impingement syndrome, right   . Vitamin D deficiency    Past Surgical History:  Procedure Laterality Date  . ABDOMINAL HYSTERECTOMY  2006   heavy menses, endometriosis, l oophrectomy  . BACK SURGERY    . BREAST BIOPSY Right 2018   benign  . BREAST EXCISIONAL BIOPSY Right 2003   benign  . BREAST EXCISIONAL  BIOPSY Right 1999   benign  . BREAST SURGERY     right breast x 2 , benign  . HERNIA REPAIR  2003   left inguinal   . LEFT OOPHORECTOMY    . SHOULDER ARTHROSCOPY WITH SUBACROMIAL DECOMPRESSION AND OPEN ROTATOR C Right 07/14/2016   Procedure: RIGHT SHOULDER ARTHROSCOPY WITH SUBACROMIAL DECOMPRESSION, DISTAL CLAVICLE RESECTION AND MINI OPEN ROTATOR CUFF REPAIR, OPEN BICEP TENDODESIS;  Surgeon: Garald Balding, MD;  Location: [facility];  Service: Orthopedics;  Laterality: Right;  . SHOULDER CLOSED REDUCTION Right 09/08/2016   Procedure: RIGHT CLOSED MANIPULATION SHOULDER;  Surgeon: Garald Balding, MD;  Location: [facility];  Service: Orthopedics;  Laterality: Right;  . TUBAL LIGATION      FAMILY HISTORY Family History  Problem Relation Age of Onset  . Diabetes Mother   . Heart disease Mother 2  . Hypertension Mother   . Hyperlipidemia Mother   . Heart failure Mother   . Stroke Mother   . Thyroid disease Mother   . Depression Mother   . Sleep apnea Mother   . Obesity Mother   . Cancer Father 55       Lung Cancer  . Heart disease Father   . Mental illness Sister        bipolar, substance abuse,  clean 2 yrs  . Hypertension Sister   . Cancer Paternal Grandmother 20       breast cancer  . Breast cancer Paternal Grandmother   . Diabetes Maternal Grandmother   . Hypertension Maternal Grandmother   . Diabetes Son     SOCIAL HISTORY Social History   Tobacco Use  . Smoking status: Never Smoker  . Smokeless tobacco: Never Used  Vaping Use  . Vaping Use: Never used  Substance Use Topics  . Alcohol use: No  . Drug use: No         OPHTHALMIC EXAM:  Base Eye Exam    Visual Acuity (Snellen - Linear)      Right Left   Dist cc 20/60 -1 20/50   Dist ph cc 20/50 -1 20/50 +1   Correction: Glasses  Tonometry (Tonopen, 1:36 PM)      Right Left   Pressure 14 17       Pupils      Dark Light Shape React APD   Right 6 6 Round Minimal  None   Left 6 6 Round Minimal None  Difficult to assess due to light sensitivity       Visual Fields (Counting fingers)      Left Right    Full Full       Extraocular Movement      Right Left    Full, Ortho Full, Ortho       Neuro/Psych    Oriented x3: Yes   Mood/Affect: Normal       Dilation    Both eyes: 1.0% Mydriacyl, 2.5% Phenylephrine @ 1:36 PM        Slit Lamp and Fundus Exam    Slit Lamp Exam      Right Left   Lids/Lashes Normal Normal   Conjunctiva/Sclera Melanosis, trace Injection Melanosis, no injection   Cornea arcus, 1+ Punctate epithelial erosions, trace Debris in tear film - inferiorly, decreased TBUT arcus, 2-3+Punctate epithelial erosions, Debris in tear film, decreased TBUT   Anterior Chamber Deep, 0.5+Cell/pigment Deep, 0.5+Cell/pigment   Iris Round and reactive, poorly dilated Round and reactive   Lens 2+ Nuclear sclerosis, 2+ Cortical cataract 2+ Nuclear sclerosis, 2+ Cortical cataract   Vitreous no cell or vitritis, Vitreous syneresis no cell or vitritis, Vitreous syneresis       Fundus Exam      Right Left   Disc Pink and Sharp, +cupping Pink and Sharp, +cupping, mild PPP   C/D Ratio 0.6 0.6   Macula Flat, Good foveal reflex, RPE mottling, No heme or edema Flat, Good foveal reflex, RPE mottling, No heme or edema   Vessels mild attenuation, mild AV crossing changes mild attenuation   Periphery Attached, mild White without pressure temporally Attached, mild White without pressure        Refraction    Wearing Rx      Sphere Cylinder Axis Add   Right +1.00 +1.00 056 +1.25   Left +0.75 +1.25 132 +1.25       Manifest Refraction (Auto)      Sphere Cylinder Axis Dist VA   Right +2.00 Sphere  20/30-2   Left +1.75 +0.25 072 20/40-1          IMAGING AND PROCEDURES  Imaging and Procedures for 02/18/2020  OCT, Retina - OU - Both Eyes       Right Eye Quality was borderline. Central Foveal Thickness: 248. Progression has been stable.  Findings include normal foveal contour, no IRF, no SRF, vitreomacular adhesion .   Left Eye Quality was good. Central Foveal Thickness: 239. Progression has been stable. Findings include normal foveal contour, no IRF, no SRF, vitreomacular adhesion .   Notes *Images captured and stored on drive  Diagnosis / Impression:  NFP; no IRF/SRF  Clinical management:  See below  Abbreviations: NFP - Normal foveal profile. CME - cystoid macular edema. PED - pigment epithelial detachment. IRF - intraretinal fluid. SRF - subretinal fluid. EZ - ellipsoid zone. ERM - epiretinal membrane. ORA - outer retinal atrophy. ORT - outer retinal tubulation. SRHM - subretinal hyper-reflective material. 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 Retinal edema  H35.81  OCT, Retina - OU - Both Eyes  3. Essential hypertension  I10   4. Hypertensive retinopathy of both eyes  H35.033   5. Diabetes mellitus type 2 without retinopathy ([place])  E11.9   6. Combined forms of age-related cataract of both eyes  H25.813     1,2. Anterior uveitis OU  - eye pain, redness, photophobia since Dec 2020, after getting COVID infection (was hospitalized for 2 wks)  - has been managed by [facility]  - underwent limited lab work up on 06/21/19   Quant-Gold, ACE, ANA, HLA-B27 -- all normal   ESR slightly elevated (37)  - initial exam here 08.18.21 -- +conj injection OU (OD > OS); 1-2+ AC cell OU; no evidence of posterior involvement  - today, BCVA 20/50 OU (?dryness) and exam shows interval improvement in inflammation, but worse corneal findings  - suspect most of pt's symptoms related to cornea more than anterior uveitis -- working long hours in front of computer  - OCT without macular edema OU  - FA 08.25.21 shows Mild focal late perivascular leakage temporal periphery OU -- ?inflammatory vs HTN  - continue   PF q2h while awake OU  -- decrease to QID   prolensa QID OU    Cyclopentolate or tropicamide BID OU -- use as needed   Cosopt BID OU   Brimonidine BID OU  - add Systane QID OU  - IOP 14,17 -- improved on brim and cosopt, likely steroid response  - f/u in 2-3 weeks-- DFE/OCT  3,4. Hypertensive retinopathy OU - discussed importance of tight BP control - monitor  5. Diabetes mellitus, type 2 without retinopathy  - The incidence, risk factors for progression, natural history and treatment options for diabetic retinopathy  were discussed with patient.    - The need for close monitoring of blood glucose, blood pressure, and serum lipids, avoiding cigarette or any type of tobacco, and the need for long term follow up was also discussed with patient.  - f/u in 1 year, sooner prn  6. Mixed cataract OU  - The symptoms of cataract, surgical options, and treatments and risks were discussed with patient.  - discussed diagnosis and progression  - not yet visually significant  - monitor for now   Ophthalmic Meds Ordered this visit:  No orders of the defined types were placed in this encounter.     Return for f/u 2-3 weeks, anterior uveitis OU, DFE, OCT.  There are no Patient Instructions on file for this visit.   Explained the diagnoses, plan, and follow up with the patient and they expressed understanding.  Patient expressed understanding of the importance of proper follow up care.   This document serves as a record of services personally performed by Gardiner Sleeper, MD, PhD. It was created on their behalf by Estill Bakes, COT an ophthalmic technician. The creation of this record is the provider's dictation and/or activities during the visit.    Electronically signed by: Estill Bakes, COT 9.9.21 @ 12:18 PM   This document serves as a record of services personally performed by Gardiner Sleeper, MD, PhD. It was created on their behalf by San Jetty. Owens Shark, OA an ophthalmic technician. The creation of this record  is the provider's dictation and/or activities during the visit.    Electronically signed by: San Jetty. Marguerita Merles 09.14.2021 12:18 PM  Gardiner Sleeper, M.D., Ph.D. Diseases & Surgery of the Retina and [place] 02/18/2020  I have reviewed the above documentation for accuracy and completeness, and I agree with the above. Gardiner Sleeper, M.D., Ph.D. 02/19/20 12:18 PM   Abbreviations: M myopia (nearsighted); A astigmatism; H hyperopia (farsighted); P presbyopia; Mrx spectacle prescription;  CTL contact lenses; OD right eye; OS left eye; OU both eyes  XT exotropia; ET esotropia; PEK punctate epithelial keratitis; PEE punctate epithelial erosions; DES dry eye syndrome; MGD meibomian gland dysfunction; ATs artificial tears; PFAT's preservative free artificial tears; Neligh nuclear sclerotic cataract; PSC posterior subcapsular cataract; ERM epi-retinal membrane; PVD posterior vitreous detachment; RD retinal detachment; DM diabetes mellitus; DR diabetic retinopathy; NPDR non-proliferative diabetic retinopathy; PDR proliferative diabetic retinopathy; CSME clinically significant macular edema; DME diabetic macular edema; dbh dot blot hemorrhages; CWS cotton wool spot; POAG primary open angle glaucoma; C/D cup-to-disc ratio; HVF humphrey visual field; GVF goldmann visual field; OCT optical coherence tomography; IOP intraocular pressure; BRVO Branch retinal vein occlusion; CRVO central retinal vein occlusion; CRAO central retinal artery occlusion; BRAO branch retinal artery occlusion; RT retinal tear; SB scleral buckle; PPV pars plana vitrectomy; VH Vitreous hemorrhage; PRP panretinal laser photocoagulation; IVK intravitreal kenalog; VMT vitreomacular traction; MH Macular hole;  NVD neovascularization of the disc; NVE neovascularization elsewhere; AREDS age related eye disease study; ARMD age related macular degeneration; POAG primary open angle glaucoma; EBMD epithelial/anterior  basement membrane dystrophy; ACIOL anterior chamber intraocular lens; IOL intraocular lens; PCIOL posterior chamber intraocular lens; Phaco/IOL phacoemulsification with intraocular lens placement; Dovray photorefractive keratectomy; LASIK laser assisted in situ keratomileusis; HTN hypertension; DM diabetes mellitus; COPD chronic obstructive pulmonary disease

## 2020-02-18 ENCOUNTER — Ambulatory Visit (INDEPENDENT_AMBULATORY_CARE_PROVIDER_SITE_OTHER): Payer: 59 | Admitting: Ophthalmology

## 2020-02-18 ENCOUNTER — Other Ambulatory Visit: Payer: Self-pay

## 2020-02-18 ENCOUNTER — Encounter (INDEPENDENT_AMBULATORY_CARE_PROVIDER_SITE_OTHER): Payer: Self-pay | Admitting: Ophthalmology

## 2020-02-18 DIAGNOSIS — H3581 Retinal edema: Secondary | ICD-10-CM

## 2020-02-18 DIAGNOSIS — I10 Essential (primary) hypertension: Secondary | ICD-10-CM

## 2020-02-18 DIAGNOSIS — H35033 Hypertensive retinopathy, bilateral: Secondary | ICD-10-CM | POA: Diagnosis not present

## 2020-02-18 DIAGNOSIS — H209 Unspecified iridocyclitis: Secondary | ICD-10-CM

## 2020-02-18 DIAGNOSIS — E119 Type 2 diabetes mellitus without complications: Secondary | ICD-10-CM

## 2020-02-18 DIAGNOSIS — H25813 Combined forms of age-related cataract, bilateral: Secondary | ICD-10-CM

## 2020-02-18 MED FILL — PROLENSA 0.07% EYE DROPS: 0.07 | 16 days supply | Qty: 3 | Fill #1

## 2020-02-19 ENCOUNTER — Encounter (INDEPENDENT_AMBULATORY_CARE_PROVIDER_SITE_OTHER): Payer: Self-pay | Admitting: Ophthalmology

## 2020-02-21 MED FILL — LOSARTAN-HCTZ 50-12.5 MG TA: 50-12.5 | 90 days supply | Qty: 90 | Fill #0

## 2020-02-26 ENCOUNTER — Encounter: Payer: 59 | Attending: Endocrinology | Admitting: Nutrition

## 2020-02-26 ENCOUNTER — Other Ambulatory Visit: Payer: Self-pay

## 2020-02-26 ENCOUNTER — Telehealth: Payer: Self-pay | Admitting: Nutrition

## 2020-02-26 DIAGNOSIS — Z794 Long term (current) use of insulin: Secondary | ICD-10-CM | POA: Insufficient documentation

## 2020-02-26 DIAGNOSIS — E119 Type 2 diabetes mellitus without complications: Secondary | ICD-10-CM | POA: Insufficient documentation

## 2020-02-26 NOTE — Telephone Encounter (Signed)
Can she start out with no boluses, and see me on Friday to f/u? thanks

## 2020-02-26 NOTE — Telephone Encounter (Signed)
This was written in error. Please disreguard

## 2020-02-26 NOTE — Telephone Encounter (Signed)
Starting her on V-Go today.

## 2020-02-26 NOTE — Telephone Encounter (Addendum)
error 

## 2020-02-26 NOTE — Progress Notes (Signed)
Patient was identified by name and DOB.  She is here to learn about the V-Go.  She was shown the V-Go a year ago, but wants to review this.   After see this, and learning how it works, she is not sure that she needs it. She is taking Ozempic and Tresiba 20u qHSm Humalog 20u acB-does not eat lunch, and supper is very light, so does not take Humalog with this.  FBSs are usually less than 100 Last night blood, blood sugar was 77 acS and she was feeling very weak and shakey.  She ate a sandwich, chips and dip, apple, and water. No humalog.  FBS today was 123.   Blood sugars 4 hours pcB 111. She says she does not want any more lows,"it was very uncomfortale--dizzy, light headed and weak.   She does not think she wants to be on the V-Go

## 2020-02-27 NOTE — Progress Notes (Signed)
[facility] Note  03/02/2020     CHIEF COMPLAINT Patient presents for Retina Follow Up   HISTORY OF PRESENT ILLNESS: [name] is a 49 y.o. female who presents to the clinic today for:   HPI    Retina Follow Up    Patient presents with  Other.  In both eyes.  This started 2 weeks ago.  Severity is moderate.  I, the attending physician,  performed the HPI with the patient and updated documentation appropriately.          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 Patient states vision normal some days. Last two days  Can't hardly see light sensitive. Having pressure OS and head hurts. Glasses are new from March. Using red top drops. The pressure in eye feels like eye is really big and no room in eye socket for it. Blood sugar this am 106.       Last edited by Bernarda Caffey, MD on 03/02/2020  9:48 PM. (History)    pt states she has been having a hard time the past couple of days, she is using a red top drop to help with the pain, she states increasing AT's helped her vision while at work  Referring physician: Crecencio Mc, MD [address] [place],  [place] [address]  HISTORICAL INFORMATION:   Selected notes from the [place] referral for anterior uveitis    CURRENT MEDICATIONS: Current Outpatient Medications (Ophthalmic Drugs)  Medication Sig  . brimonidine (ALPHAGAN) 0.15 % ophthalmic solution Place 1 drop into both eyes in the morning and at bedtime.  . Bromfenac Sodium (PROLENSA) 0.07 % SOLN Place 1 drop into both eyes 4 (four) times daily.  . cyclopentolate (CYCLODRYL,CYCLOGYL) 1 % ophthalmic solution 1 drop 2 (two) times daily.  . dorzolamide-timolol (COSOPT) 22.3-6.8 MG/ML ophthalmic solution Place 1 drop into both eyes 2 (two) times daily.  . ISOPTO ATROPINE 1 % ophthalmic solution   . prednisoLONE acetate (PRED FORTE) 1 % ophthalmic suspension Place 1 drop into  both eyes every hour. (Patient taking differently: Place 1 drop into both eyes every 2 (two) hours while awake. )   No current facility-administered medications for this visit. (Ophthalmic Drugs)   Current Outpatient Medications (Other)  Medication Sig  . albuterol (VENTOLIN HFA) 108 (90 Base) MCG/ACT inhaler Inhale 2 puffs into the lungs every 6 (six) hours as needed for wheezing or shortness of breath.  . ALPRAZolam (XANAX) 0.5 MG tablet Take 1 tablet (0.5 mg total) by mouth 3 (three) times daily as needed (Headache).  [name] aspirin EC 81 MG tablet Take 81 mg by mouth daily.  [name] atorvastatin (LIPITOR) 20 MG tablet Take 1 tablet (20 mg total) by mouth daily at 6 PM.  . benzonatate (TESSALON) 100 MG capsule Take 1 capsule (100 mg total) by mouth 3 (three) times daily as needed for cough.  . Blood Glucose Monitoring Suppl (FREESTYLE LITE) DEVI 1 each by Does not apply route 2 (two) times daily. E11.9  . butalbital-acetaminophen-caffeine (FIORICET) 50-325-40 MG tablet Take 1 tablet by mouth every 6 (six) hours as needed for headache. Do not refill in less than 30 day  . celecoxib (CELEBREX) 200 MG capsule Take 1 capsule (200 mg total) by mouth 2 (two) times daily as needed.  . diazepam (VALIUM) 2 MG tablet Take 1 tablet (2 mg total) by mouth every 6 (six) hours  as needed (Headache).  . diltiazem (CARDIZEM LA) 240 MG 24 hr tablet TAKE 1 TABLET BY MOUTH DAILY  . EPINEPHrine 0.3 mg/0.3 mL IJ SOAJ injection Inject 0.3 mLs (0.3 mg total) into the muscle as needed (for allergic reaction).  [name] ergocalciferol (DRISDOL) 1.25 MG (50000 UT) capsule Take 1 capsule (50,000 Units total) by mouth once a week.  . Fremanezumab-vfrm (AJOVY) 225 MG/1.5ML SOAJ Inject 675 mg into the skin every 3 (three) months.  [name] glucose blood (FREESTYLE LITE) test strip 1 each by Other route 2 (two) times daily. E11.9  . hydrochlorothiazide (MICROZIDE) 12.5 MG capsule TAKE 1 CAPSULE BY MOUTH DAILY.  [name] insulin lispro (HUMALOG KWIKPEN) 100  UNIT/ML KwikPen Inject 0.2 mLs (20 Units total) into the skin 2 (two) times daily with a meal.  . Insulin Pen Needle (PEN NEEDLES 31GX5/16") 31G X 8 MM MISC 1 each by Does not apply route in the morning, at noon, in the evening, and at bedtime. E11.9  . ketorolac (TORADOL) 10 MG tablet Take 1 tablet (10 mg total) by mouth every 6 (six) hours as needed. (Patient taking differently: Take 10 mg by mouth every 6 (six) hours as needed (Headache). )  . Lancets (FREESTYLE) lancets 1 each by Other route 2 (two) times daily. E11.9  . losartan-hydrochlorothiazide (HYZAAR) 50-12.5 MG tablet Take 1 tablet by mouth daily.  . metFORMIN (GLUCOPHAGE-XR) 500 MG 24 hr tablet Take 2 tablets (1,000 mg total) by mouth 2 (two) times daily. (Patient taking differently: Take 1,000 mg by mouth daily. )  . metoprolol succinate (TOPROL-XL) 25 MG 24 hr tablet TAKE 1/2 TABLET BY MOUTH DAILY.  [name] ondansetron (ZOFRAN ODT) 4 MG disintegrating tablet Take 1 tablet (4 mg total) by mouth every 8 (eight) hours as needed for nausea or vomiting.  [name] oxyCODONE-acetaminophen (PERCOCET/ROXICET) 5-325 MG tablet Take by mouth as needed for severe pain.  . Semaglutide, 1 MG/DOSE, (OZEMPIC, 1 MG/DOSE,) 2 MG/1.5ML SOPN Inject 0.75 mLs (1 mg total) into the skin once a week.  . TRESIBA FLEXTOUCH 100 UNIT/ML FlexTouch Pen INJECT 0.6 MLS (60 UNITS TOTAL) INTO THE SKIN DAILY.   No current facility-administered medications for this visit. (Other)      REVIEW OF SYSTEMS: ROS    Positive for: Neurological, Musculoskeletal, Endocrine, Cardiovascular, Eyes, Psychiatric   Negative for: Constitutional, Gastrointestinal, Skin, Genitourinary, HENT, Respiratory, Allergic/Imm, Heme/Lymph   Last edited by Theodore Demark, COA on 03/02/2020  2:35 PM. (History)       ALLERGIES Allergies  Allergen Reactions  . Bamlanivimab Anaphylaxis, Itching, Palpitations, Rash, Shortness Of Breath and Swelling  . Botox [Onabotulinumtoxina] Shortness Of Breath     syncope  . Clindamycin/Lincomycin Other (See Comments)  . Kiwi Extract Shortness Of Breath  . Maxalt [Rizatriptan Benzoate] Anaphylaxis    Chest pain  . Nitrous Oxide Shortness Of Breath    syncope  . Triptans Shortness Of Breath  . Aspirin Nausea And Vomiting    Mouth blisters  . Contrast Media [Iodinated Diagnostic Agents]   . Flagyl [Metronidazole]   . Hydrocodone-Acetaminophen Other (See Comments)  . Latex     Sometimes causes rash  . Mango Flavor   . Rizatriptan Other (See Comments)  . Vicodin [Hydrocodone-Acetaminophen]     hallucinations  . Flu Virus Vaccine Palpitations  . Penicillins Nausea And Vomiting, Rash and Other (See Comments)    Did it involve swelling of the face/tongue/throat, SOB, or low BP? No Did it involve sudden or severe rash/hives, skin peeling, or any reaction  on the inside of your mouth or nose? No Did you need to seek medical attention at a hospital or doctor's office? Yes When did it last happen?patient was 49 years old If all above answers are "NO", may proceed with cephalosporin use.  . Tetracyclines & Related Nausea And Vomiting and Rash    PAST MEDICAL HISTORY Past Medical History:  Diagnosis Date  . Allergy   . Anemia   . Anxiety    claustrophobic  . Asthma   . Back pain   . Biceps tendonosis of right shoulder   . Diabetes mellitus 2011   did not start metforfin, losing weight  . Dyspnea   . Fatty liver   . Food allergy   . Headache disorder   . History of concussion   . HLD (hyperlipidemia)   . Hypertension   . IBS (irritable bowel syndrome)   . Infertility, female   . Joint pain   . Lactose intolerance   . Leg edema   . Migraines   . Other specified disorders of thyroid   . Palpitation   . Post-menopausal   . Seborrheic dermatitis   . Shoulder impingement syndrome, right   . Vitamin D deficiency    Past Surgical History:  Procedure Laterality Date  . ABDOMINAL HYSTERECTOMY  2006   heavy menses, endometriosis, l  oophrectomy  . BACK SURGERY    . BREAST BIOPSY Right 2018   benign  . BREAST EXCISIONAL BIOPSY Right 2003   benign  . BREAST EXCISIONAL BIOPSY Right 1999   benign  . BREAST SURGERY     right breast x 2 , benign  . HERNIA REPAIR  2003   left inguinal   . LEFT OOPHORECTOMY    . SHOULDER ARTHROSCOPY WITH SUBACROMIAL DECOMPRESSION AND OPEN ROTATOR C Right 07/14/2016   Procedure: RIGHT SHOULDER ARTHROSCOPY WITH SUBACROMIAL DECOMPRESSION, DISTAL CLAVICLE RESECTION AND MINI OPEN ROTATOR CUFF REPAIR, OPEN BICEP TENDODESIS;  Surgeon: Garald Balding, MD;  Location: [facility];  Service: Orthopedics;  Laterality: Right;  . SHOULDER CLOSED REDUCTION Right 09/08/2016   Procedure: RIGHT CLOSED MANIPULATION SHOULDER;  Surgeon: Garald Balding, MD;  Location: [facility];  Service: Orthopedics;  Laterality: Right;  . TUBAL LIGATION      FAMILY HISTORY Family History  Problem Relation Age of Onset  . Diabetes Mother   . Heart disease Mother 22  . Hypertension Mother   . Hyperlipidemia Mother   . Heart failure Mother   . Stroke Mother   . Thyroid disease Mother   . Depression Mother   . Sleep apnea Mother   . Obesity Mother   . Cancer Father 3       Lung Cancer  . Heart disease Father   . Mental illness Sister        bipolar, substance abuse,  clean 2 yrs  . Hypertension Sister   . Cancer Paternal Grandmother 52       breast cancer  . Breast cancer Paternal Grandmother   . Diabetes Maternal Grandmother   . Hypertension Maternal Grandmother   . Diabetes Son     SOCIAL HISTORY Social History   Tobacco Use  . Smoking status: Never Smoker  . Smokeless tobacco: Never Used  Vaping Use  . Vaping Use: Never used  Substance Use Topics  . Alcohol use: No  . Drug use: No         OPHTHALMIC EXAM:  Base Eye Exam    Visual  Acuity (Snellen - Linear)      Right Left   Dist cc 20/40 20/50   Dist ph cc 20/20 20/30       Tonometry (Tonopen, 2:29 PM)       Right Left   Pressure 17 17       Pupils      Dark Light Shape React APD   Right 6 6 Round Minimal None   Left 6 6 Round Minimal None       Visual Fields (Counting fingers)      Left Right    Full Full       Extraocular Movement      Right Left    Full, Ortho Full, Ortho       Neuro/Psych    Oriented x3: Yes   Mood/Affect: Normal       Dilation    Both eyes: 1.0% Mydriacyl, 2.5% Phenylephrine @ 2:29 PM        Slit Lamp and Fundus Exam    Slit Lamp Exam      Right Left   Lids/Lashes Normal Normal   Conjunctiva/Sclera Melanosis, trace Injection Melanosis, no injection   Cornea arcus, 2+ inferior Punctate epithelial erosions arcus, 3+Punctate epithelial erosions, decreased TBUT   Anterior Chamber Deep, 1+fine Cell/pigment Deep, 1+fine Cell/pigment   Iris Round and dilated Round and reactive   Lens 2+ Nuclear sclerosis, 2+ Cortical cataract 2+ Nuclear sclerosis, 2+ Cortical cataract   Vitreous no cell or vitritis, Vitreous syneresis no cell or vitritis, Vitreous syneresis       Fundus Exam      Right Left   Disc Pink and Sharp, +cupping Pink and Sharp, +cupping, mild PPP   C/D Ratio 0.6 0.6   Macula Flat, Good foveal reflex, RPE mottling, No heme or edema Flat, Good foveal reflex, RPE mottling, No heme or edema   Vessels mild attenuation, mild AV crossing changes mild attenuation   Periphery Attached, mild White without pressure temporally Attached, mild White without pressure        Refraction    Wearing Rx      Sphere Cylinder Axis Add   Right +1.00 +1.00 056 +1.25   Left +0.75 +1.25 132 +1.25          IMAGING AND PROCEDURES  Imaging and Procedures for 03/02/2020  OCT, Retina - OU - Both Eyes       Right Eye Quality was good. Central Foveal Thickness: 242. Progression has been stable. Findings include normal foveal contour, no IRF, no SRF, vitreomacular adhesion .   Left Eye Quality was good. Central Foveal Thickness: 238. Progression has  been stable. Findings include normal foveal contour, no IRF, no SRF, vitreomacular adhesion .   Notes *Images captured and stored on drive  Diagnosis / Impression:  NFP; no IRF/SRF  Clinical management:  See below  Abbreviations: NFP - Normal foveal profile. CME - cystoid macular edema. PED - pigment epithelial detachment. IRF - intraretinal fluid. SRF - subretinal fluid. EZ - ellipsoid zone. ERM - epiretinal membrane. ORA - outer retinal atrophy. ORT - outer retinal tubulation. SRHM - subretinal hyper-reflective material. 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 Retinal edema  H35.81 OCT, Retina - OU - Both Eyes  3. Essential hypertension  I10   4. Hypertensive retinopathy of both eyes  H35.033  5. Diabetes mellitus type 2 without retinopathy ([place])  E11.9   6. Combined forms of age-related cataract of both eyes  H25.813     1,2. Anterior uveitis OU  - eye pain, redness, photophobia since Dec 2020, after getting COVID infection (was hospitalized for 2 wks)  - has been managed by [facility]  - underwent limited lab work up on 06/21/19   Quant-Gold, ACE, ANA, HLA-B27 -- all normal   ESR slightly elevated (37)  - initial exam here 08.18.21 -- +conj injection OU (OD > OS); 1-2+ AC cell OU; no evidence of posterior involvement  - today, BCVA 20/20 OD; 20/30 OS  (+dryness) and exam shows interval improvement in inflammation, but persistent corneal findings -- +PEE OU  - suspect most of pt's symptoms related to cornea more than anterior uveitis -- working long hours in front of computer  - OCT without macular edema OU  - FA 08.25.21 shows Mild focal late perivascular leakage temporal periphery OU -- ?inflammatory vs HTN  - continue   PF decrease to TID OU   prolensa ddecrease to TID OU   Cyclopentolate or tropicamide BID OU -- use as needed   Cosopt BID OU   Brimonidine BID OU  -  cont Systane QID OU  - add lubricating gel/ointment QHS OU  - IOP 17 -- improved on brim and cosopt, likely steroid response  - f/u in 2-3 weeks-- DFE/OCT  3,4. Hypertensive retinopathy OU - discussed importance of tight BP control - monitor  5. Diabetes mellitus, type 2 without retinopathy  - The incidence, risk factors for progression, natural history and treatment options for diabetic retinopathy  were discussed with patient.    - The need for close monitoring of blood glucose, blood pressure, and serum lipids, avoiding cigarette or any type of tobacco, and the need for long term follow up was also discussed with patient.  - f/u in 1 year, sooner prn  6. Mixed cataract OU  - The symptoms of cataract, surgical options, and treatments and risks were discussed with patient.  - discussed diagnosis and progression  - not yet visually significant  - monitor for now   Ophthalmic Meds Ordered this visit:  No orders of the defined types were placed in this encounter.     Return for f/u 2-3 weeks, anterior uveitis OU, OCT, DFE.  There are no Patient Instructions on file for this visit.  This document serves as a record of services personally performed by Gardiner Sleeper, MD, PhD. It was created on their behalf by Leeann Must, [place], an ophthalmic technician. The creation of this record is the provider's dictation and/or activities during the visit.    Electronically signed by: Leeann Must, [place] 09.23.2021 9:51 PM   This document serves as a record of services personally performed by Gardiner Sleeper, MD, PhD. It was created on their behalf by San Jetty. Owens Shark, OA an ophthalmic technician. The creation of this record is the provider's dictation and/or activities during the visit.    Electronically signed by: San Jetty. Marguerita Merles 09.27.2021 9:51 PM  Gardiner Sleeper, M.D., Ph.D. Diseases & Surgery of the Retina and [place] 03/02/2020   I have  reviewed the above documentation for accuracy and completeness, and I agree with the above. Gardiner Sleeper, M.D., Ph.D. 03/02/20 9:51 PM   Abbreviations: M myopia (nearsighted); A astigmatism; H hyperopia (farsighted); P presbyopia; Mrx spectacle prescription;  CTL contact lenses; OD  right eye; OS left eye; OU both eyes  XT exotropia; ET esotropia; PEK punctate epithelial keratitis; PEE punctate epithelial erosions; DES dry eye syndrome; MGD meibomian gland dysfunction; ATs artificial tears; PFAT's preservative free artificial tears; [place] nuclear sclerotic cataract; PSC posterior subcapsular cataract; ERM epi-retinal membrane; PVD posterior vitreous detachment; RD retinal detachment; DM diabetes mellitus; DR diabetic retinopathy; NPDR non-proliferative diabetic retinopathy; PDR proliferative diabetic retinopathy; CSME clinically significant macular edema; DME diabetic macular edema; dbh dot blot hemorrhages; CWS cotton wool spot; POAG primary open angle glaucoma; C/D cup-to-disc ratio; HVF humphrey visual field; GVF goldmann visual field; OCT optical coherence tomography; IOP intraocular pressure; BRVO Branch retinal vein occlusion; CRVO central retinal vein occlusion; CRAO central retinal artery occlusion; BRAO branch retinal artery occlusion; RT retinal tear; SB scleral buckle; PPV pars plana vitrectomy; VH Vitreous hemorrhage; PRP panretinal laser photocoagulation; IVK intravitreal kenalog; VMT vitreomacular traction; MH Macular hole;  NVD neovascularization of the disc; NVE neovascularization elsewhere; AREDS age related eye disease study; ARMD age related macular degeneration; POAG primary open angle glaucoma; EBMD epithelial/anterior basement membrane dystrophy; ACIOL anterior chamber intraocular lens; IOL intraocular lens; PCIOL posterior chamber intraocular lens; Phaco/IOL phacoemulsification with intraocular lens placement; [place] photorefractive keratectomy; LASIK laser assisted in situ keratomileusis;  HTN hypertension; DM diabetes mellitus; COPD chronic obstructive pulmonary disease

## 2020-02-28 ENCOUNTER — Telehealth: Payer: 59 | Admitting: Endocrinology

## 2020-02-28 ENCOUNTER — Other Ambulatory Visit: Payer: Self-pay

## 2020-03-02 ENCOUNTER — Ambulatory Visit (INDEPENDENT_AMBULATORY_CARE_PROVIDER_SITE_OTHER): Payer: 59 | Admitting: Ophthalmology

## 2020-03-02 ENCOUNTER — Other Ambulatory Visit: Payer: Self-pay | Admitting: Endocrinology

## 2020-03-02 ENCOUNTER — Other Ambulatory Visit: Payer: Self-pay

## 2020-03-02 ENCOUNTER — Encounter (INDEPENDENT_AMBULATORY_CARE_PROVIDER_SITE_OTHER): Payer: Self-pay | Admitting: Ophthalmology

## 2020-03-02 DIAGNOSIS — H209 Unspecified iridocyclitis: Secondary | ICD-10-CM

## 2020-03-02 DIAGNOSIS — H25813 Combined forms of age-related cataract, bilateral: Secondary | ICD-10-CM

## 2020-03-02 DIAGNOSIS — E1165 Type 2 diabetes mellitus with hyperglycemia: Secondary | ICD-10-CM

## 2020-03-02 DIAGNOSIS — E119 Type 2 diabetes mellitus without complications: Secondary | ICD-10-CM

## 2020-03-02 DIAGNOSIS — H35033 Hypertensive retinopathy, bilateral: Secondary | ICD-10-CM | POA: Diagnosis not present

## 2020-03-02 DIAGNOSIS — H3581 Retinal edema: Secondary | ICD-10-CM

## 2020-03-02 DIAGNOSIS — I10 Essential (primary) hypertension: Secondary | ICD-10-CM | POA: Diagnosis not present

## 2020-03-02 MED FILL — TRESIBA FLEXTOUCH 100 UNITS: 100 | 30 days supply | Qty: 18 | Fill #0

## 2020-03-02 MED FILL — OZEMPIC (1 MG/DOSE) 4 MG/3M: 4 | 84 days supply | Qty: 9 | Fill #1

## 2020-03-23 ENCOUNTER — Encounter (INDEPENDENT_AMBULATORY_CARE_PROVIDER_SITE_OTHER): Payer: 59 | Admitting: Ophthalmology

## 2020-03-23 DIAGNOSIS — H209 Unspecified iridocyclitis: Secondary | ICD-10-CM

## 2020-03-23 DIAGNOSIS — I10 Essential (primary) hypertension: Secondary | ICD-10-CM

## 2020-03-23 DIAGNOSIS — H25813 Combined forms of age-related cataract, bilateral: Secondary | ICD-10-CM

## 2020-03-23 DIAGNOSIS — H35033 Hypertensive retinopathy, bilateral: Secondary | ICD-10-CM

## 2020-03-23 DIAGNOSIS — E119 Type 2 diabetes mellitus without complications: Secondary | ICD-10-CM

## 2020-03-23 DIAGNOSIS — H3581 Retinal edema: Secondary | ICD-10-CM

## 2020-04-13 ENCOUNTER — Other Ambulatory Visit: Payer: Self-pay | Admitting: Physician Assistant

## 2020-04-13 MED FILL — DILTIAZEM ER 240 MG TABLET: 240 | 90 days supply | Qty: 90 | Fill #1

## 2020-04-14 ENCOUNTER — Other Ambulatory Visit: Payer: Self-pay

## 2020-04-14 ENCOUNTER — Encounter (INDEPENDENT_AMBULATORY_CARE_PROVIDER_SITE_OTHER): Payer: Self-pay | Admitting: Ophthalmology

## 2020-04-14 ENCOUNTER — Ambulatory Visit (INDEPENDENT_AMBULATORY_CARE_PROVIDER_SITE_OTHER): Payer: 59 | Admitting: Ophthalmology

## 2020-04-14 DIAGNOSIS — I10 Essential (primary) hypertension: Secondary | ICD-10-CM

## 2020-04-14 DIAGNOSIS — H3581 Retinal edema: Secondary | ICD-10-CM | POA: Diagnosis not present

## 2020-04-14 DIAGNOSIS — E119 Type 2 diabetes mellitus without complications: Secondary | ICD-10-CM | POA: Diagnosis not present

## 2020-04-14 DIAGNOSIS — H25813 Combined forms of age-related cataract, bilateral: Secondary | ICD-10-CM | POA: Diagnosis not present

## 2020-04-14 DIAGNOSIS — H209 Unspecified iridocyclitis: Secondary | ICD-10-CM

## 2020-04-14 DIAGNOSIS — H35033 Hypertensive retinopathy, bilateral: Secondary | ICD-10-CM

## 2020-04-14 NOTE — Progress Notes (Signed)
[facility] Note  04/14/2020     CHIEF COMPLAINT Patient presents for Retina Follow Up   HISTORY OF PRESENT ILLNESS: [name] is a 49 y.o. female who presents to the clinic today for:   HPI    Retina Follow Up    Patient presents with  Other.  In both eyes.  This started 1 day ago.  I, the attending physician,  performed the HPI with the patient and updated documentation appropriately.          Comments    Patient here for retina follow up for Uveitis flare up. Patient states eye feel real tired. By time evening comes they hurt. Has dryness. Getting harder to see at night. Sees shadows. Last night walking in parking lot and almost ran into a man walking. Feels like constant strain. White background hard to read.        Last edited by Bernarda Caffey, MD on 04/14/2020  1:19 PM. (History)    pt called this morning and wanted to be seen for a uveitis flare up, pt states she has been having to leave work early due to not be able to work due to her eyes being so painful, she states she left early last week due to having so much pressure behind her eye that she felt like she was going to throw up, pt states her eyes are tired, painful and sensitive to light, pt has been using all the drops she was given except Systane gel bc she doesn't like the way it feels in her eyes, pt states her eyes do not feel dry  Referring physician: Crecencio Mc, MD [address],  [place] [address]  HISTORICAL INFORMATION:   Selected notes from the [facility] referral for anterior uveitis    CURRENT MEDICATIONS: Current Outpatient Medications (Ophthalmic Drugs)  Medication Sig  . brimonidine (ALPHAGAN) 0.15 % ophthalmic solution Place 1 drop into both eyes in the morning and at bedtime.  . Bromfenac Sodium (PROLENSA) 0.07 % SOLN Place 1 drop into both eyes 4 (four) times daily.  . cyclopentolate (CYCLODRYL,CYCLOGYL) 1 % ophthalmic  solution 1 drop 2 (two) times daily.  . dorzolamide-timolol (COSOPT) 22.3-6.8 MG/ML ophthalmic solution Place 1 drop into both eyes 2 (two) times daily.  . ISOPTO ATROPINE 1 % ophthalmic solution   . prednisoLONE acetate (PRED FORTE) 1 % ophthalmic suspension Place 1 drop into both eyes every hour. (Patient taking differently: Place 1 drop into both eyes every 2 (two) hours while awake. )   No current facility-administered medications for this visit. (Ophthalmic Drugs)   Current Outpatient Medications (Other)  Medication Sig  . albuterol (VENTOLIN HFA) 108 (90 Base) MCG/ACT inhaler Inhale 2 puffs into the lungs every 6 (six) hours as needed for wheezing or shortness of breath.  . ALPRAZolam (XANAX) 0.5 MG tablet Take 1 tablet (0.5 mg total) by mouth 3 (three) times daily as needed (Headache).  [name] aspirin EC 81 MG tablet Take 81 mg by mouth daily.  [name] atorvastatin (LIPITOR) 20 MG tablet Take 1 tablet (20 mg total) by mouth daily at 6 PM.  . benzonatate (TESSALON) 100 MG capsule Take 1 capsule (100 mg total) by mouth 3 (three) times daily as needed for cough.  . Blood Glucose Monitoring Suppl (FREESTYLE LITE) DEVI 1 each by Does not apply route 2 (two) times daily. E11.9  . butalbital-acetaminophen-caffeine (FIORICET) 50-325-40 MG tablet Take 1 tablet by  mouth every 6 (six) hours as needed for headache. Do not refill in less than 30 day  . celecoxib (CELEBREX) 200 MG capsule Take 1 capsule (200 mg total) by mouth 2 (two) times daily as needed.  . diazepam (VALIUM) 2 MG tablet Take 1 tablet (2 mg total) by mouth every 6 (six) hours as needed (Headache).  . diltiazem (CARDIZEM LA) 240 MG 24 hr tablet TAKE 1 TABLET BY MOUTH DAILY  . EPINEPHrine 0.3 mg/0.3 mL IJ SOAJ injection Inject 0.3 mLs (0.3 mg total) into the muscle as needed (for allergic reaction).  [name] ergocalciferol (DRISDOL) 1.25 MG (50000 UT) capsule Take 1 capsule (50,000 Units total) by mouth once a week.  . Fremanezumab-vfrm (AJOVY) 225  MG/1.5ML SOAJ Inject 675 mg into the skin every 3 (three) months.  [name] glucose blood (FREESTYLE LITE) test strip 1 each by Other route 2 (two) times daily. E11.9  . hydrochlorothiazide (MICROZIDE) 12.5 MG capsule TAKE 1 CAPSULE BY MOUTH DAILY.  [name] insulin lispro (HUMALOG KWIKPEN) 100 UNIT/ML KwikPen Inject 0.2 mLs (20 Units total) into the skin 2 (two) times daily with a meal.  . Insulin Pen Needle (PEN NEEDLES 31GX5/16") 31G X 8 MM MISC 1 each by Does not apply route in the morning, at noon, in the evening, and at bedtime. E11.9  . ketorolac (TORADOL) 10 MG tablet Take 1 tablet (10 mg total) by mouth every 6 (six) hours as needed. (Patient taking differently: Take 10 mg by mouth every 6 (six) hours as needed (Headache). )  . Lancets (FREESTYLE) lancets 1 each by Other route 2 (two) times daily. E11.9  . losartan-hydrochlorothiazide (HYZAAR) 50-12.5 MG tablet Take 1 tablet by mouth daily.  . metFORMIN (GLUCOPHAGE-XR) 500 MG 24 hr tablet Take 2 tablets (1,000 mg total) by mouth 2 (two) times daily. (Patient taking differently: Take 1,000 mg by mouth daily. )  . metoprolol succinate (TOPROL-XL) 25 MG 24 hr tablet TAKE 1/2 TABLET BY MOUTH DAILY.  [name] ondansetron (ZOFRAN ODT) 4 MG disintegrating tablet Take 1 tablet (4 mg total) by mouth every 8 (eight) hours as needed for nausea or vomiting.  [name] oxyCODONE-acetaminophen (PERCOCET/ROXICET) 5-325 MG tablet Take by mouth as needed for severe pain.  . Semaglutide, 1 MG/DOSE, (OZEMPIC, 1 MG/DOSE,) 2 MG/1.5ML SOPN Inject 0.75 mLs (1 mg total) into the skin once a week.  . TRESIBA FLEXTOUCH 100 UNIT/ML FlexTouch Pen INJECT 0.6 MLS (60 UNITS TOTAL) INTO THE SKIN DAILY.   No current facility-administered medications for this visit. (Other)      REVIEW OF SYSTEMS: ROS    Positive for: Neurological, Musculoskeletal, Endocrine, Cardiovascular, Eyes, Psychiatric   Negative for: Constitutional, Gastrointestinal, Skin, Genitourinary, HENT, Respiratory, Allergic/Imm,  Heme/Lymph   Last edited by Laddie Aquas, COA on 04/14/2020  1:08 PM. (History)       ALLERGIES Allergies  Allergen Reactions  . Bamlanivimab Anaphylaxis, Itching, Palpitations, Rash, Shortness Of Breath and Swelling  . Botox [Onabotulinumtoxina] Shortness Of Breath    syncope  . Clindamycin/Lincomycin Other (See Comments)  . Kiwi Extract Shortness Of Breath  . Maxalt [Rizatriptan Benzoate] Anaphylaxis    Chest pain  . Nitrous Oxide Shortness Of Breath    syncope  . Triptans Shortness Of Breath  . Aspirin Nausea And Vomiting    Mouth blisters  . Contrast Media [Iodinated Diagnostic Agents]   . Flagyl [Metronidazole]   . Hydrocodone-Acetaminophen Other (See Comments)  . Latex     Sometimes causes rash  . Mango Flavor   .  Rizatriptan Other (See Comments)  . Vicodin [Hydrocodone-Acetaminophen]     hallucinations  . Flu Virus Vaccine Palpitations  . Penicillins Nausea And Vomiting, Rash and Other (See Comments)    Did it involve swelling of the face/tongue/throat, SOB, or low BP? No Did it involve sudden or severe rash/hives, skin peeling, or any reaction on the inside of your mouth or nose? No Did you need to seek medical attention at a hospital or doctor's office? Yes When did it last happen?patient was 50 years old If all above answers are "NO", may proceed with cephalosporin use.  . Tetracyclines & Related Nausea And Vomiting and Rash    PAST MEDICAL HISTORY Past Medical History:  Diagnosis Date  . Allergy   . Anemia   . Anxiety    claustrophobic  . Asthma   . Back pain   . Biceps tendonosis of right shoulder   . Diabetes mellitus 2011   did not start metforfin, losing weight  . Dyspnea   . Fatty liver   . Food allergy   . Headache disorder   . History of concussion   . HLD (hyperlipidemia)   . Hypertension   . IBS (irritable bowel syndrome)   . Infertility, female   . Joint pain   . Lactose intolerance   . Leg edema   . Migraines   . Other  specified disorders of thyroid   . Palpitation   . Post-menopausal   . Seborrheic dermatitis   . Shoulder impingement syndrome, right   . Vitamin D deficiency    Past Surgical History:  Procedure Laterality Date  . ABDOMINAL HYSTERECTOMY  2006   heavy menses, endometriosis, l oophrectomy  . BACK SURGERY    . BREAST BIOPSY Right 2018   benign  . BREAST EXCISIONAL BIOPSY Right 2003   benign  . BREAST EXCISIONAL BIOPSY Right 1999   benign  . BREAST SURGERY     right breast x 2 , benign  . HERNIA REPAIR  2003   left inguinal   . LEFT OOPHORECTOMY    . SHOULDER ARTHROSCOPY WITH SUBACROMIAL DECOMPRESSION AND OPEN ROTATOR C Right 07/14/2016   Procedure: RIGHT SHOULDER ARTHROSCOPY WITH SUBACROMIAL DECOMPRESSION, DISTAL CLAVICLE RESECTION AND MINI OPEN ROTATOR CUFF REPAIR, OPEN BICEP TENDODESIS;  Surgeon: Valeria Batman, MD;  Location: [facility];  Service: Orthopedics;  Laterality: Right;  . SHOULDER CLOSED REDUCTION Right 09/08/2016   Procedure: RIGHT CLOSED MANIPULATION SHOULDER;  Surgeon: Valeria Batman, MD;  Location:  SURGERY CENTER;  Service: Orthopedics;  Laterality: Right;  . TUBAL LIGATION      FAMILY HISTORY Family History  Problem Relation Age of Onset  . Diabetes Mother   . Heart disease Mother 84  . Hypertension Mother   . Hyperlipidemia Mother   . Heart failure Mother   . Stroke Mother   . Thyroid disease Mother   . Depression Mother   . Sleep apnea Mother   . Obesity Mother   . Cancer Father 79       Lung Cancer  . Heart disease Father   . Mental illness Sister        bipolar, substance abuse,  clean 2 yrs  . Hypertension Sister   . Cancer Paternal Grandmother 72       breast cancer  . Breast cancer Paternal Grandmother   . Diabetes Maternal Grandmother   . Hypertension Maternal Grandmother   . Diabetes Son     SOCIAL HISTORY Social History  Tobacco Use  . Smoking status: Never Smoker  . Smokeless tobacco: Never Used   Vaping Use  . Vaping Use: Never used  Substance Use Topics  . Alcohol use: No  . Drug use: No         OPHTHALMIC EXAM:  Base Eye Exam    Visual Acuity (Snellen - Linear)      Right Left   Dist cc 20/70 -1 20/40   Dist ph cc 20/40 NI       Tonometry (Tonopen, 1:03 PM)      Right Left   Pressure 17 15       Pupils      Dark Light Shape React APD   Right 5 5 Round Minimal None   Left 5 5 Round Minimal None       Visual Fields (Counting fingers)      Left Right    Full Full       Extraocular Movement      Right Left    Full, Ortho Full, Ortho       Neuro/Psych    Oriented x3: Yes   Mood/Affect: Normal       Dilation    Both eyes: 1.0% Mydriacyl, 2.5% Phenylephrine @ 1:03 PM        Slit Lamp and Fundus Exam    Slit Lamp Exam      Right Left   Lids/Lashes Normal Normal   Conjunctiva/Sclera Melanosis, trace Injection Melanosis, no injection   Cornea arcus, 3+ inferior Punctate epithelial erosions arcus, 2+ inferior Punctate epithelial erosions, decreased TBUT   Anterior Chamber Deep, 1+fine Cell/pigment Deep, 1+fine Cell/pigment   Iris Round and dilated Round and reactive   Lens 2+ Nuclear sclerosis, 2+ Cortical cataract 2+ Nuclear sclerosis, 2+ Cortical cataract   Vitreous no cell or vitritis, Vitreous syneresis no cell or vitritis, Vitreous syneresis       Fundus Exam      Right Left   Disc Pink and Sharp, +cupping Pink and Sharp, +cupping, mild PPP   C/D Ratio 0.6 0.6   Macula Flat, Good foveal reflex, RPE mottling, No heme or edema Flat, Good foveal reflex, RPE mottling, No heme or edema   Vessels mild attenuation, mild AV crossing changes mild attenuation   Periphery Attached, mild White without pressure temporally Attached, mild White without pressure        Refraction    Wearing Rx      Sphere Cylinder Axis Add   Right +1.00 +1.00 056 +1.25   Left +0.75 +1.25 132 +1.25          IMAGING AND PROCEDURES  Imaging and Procedures for  04/14/2020  OCT, Retina - OU - Both Eyes       Right Eye Quality was good. Central Foveal Thickness: 242. Progression has been stable. Findings include normal foveal contour, no IRF, no SRF, vitreomacular adhesion .   Left Eye Quality was good. Central Foveal Thickness: 237. Progression has been stable. Findings include normal foveal contour, no IRF, no SRF, vitreomacular adhesion .   Notes *Images captured and stored on drive  Diagnosis / Impression:  NFP; no IRF/SRF  Clinical management:  See below  Abbreviations: NFP - Normal foveal profile. CME - cystoid macular edema. PED - pigment epithelial detachment. IRF - intraretinal fluid. SRF - subretinal fluid. EZ - ellipsoid zone. ERM - epiretinal membrane. ORA - outer retinal atrophy. ORT - outer retinal tubulation. SRHM - subretinal hyper-reflective material. 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 Retinal edema  H35.81 OCT, Retina - OU - Both Eyes  3. Essential hypertension  I10   4. Hypertensive retinopathy of both eyes  H35.033   5. Diabetes mellitus type 2 without retinopathy ([place])  E11.9   6. Combined forms of age-related cataract of both eyes  H25.813     1,2. Anterior uveitis OU  - eye pain, redness, photophobia since Dec 2020, after getting COVID infection (was hospitalized for 2 wks)  - has been managed by [facility]  - underwent limited lab work up on 06/21/19   Quant-Gold, ACE, ANA, HLA-B27 -- all normal   ESR slightly elevated (37)  - initial exam here 08.18.21 -- +conj injection OU (OD > OS); 1-2+ AC cell OU; no evidence of posterior involvement  - today, BCVA down to 20/40 OU:+dryness and exam shows stable improvement in inflammation, but persistent corneal findings -- +PEE OU  - suspect most of pt's symptoms related to cornea more than anterior uveitis -- working long hours in front of computer  - OCT without  macular edema OU  - FA 08.25.21 shows Mild focal late perivascular leakage temporal periphery OU -- ?inflammatory vs HTN  - continue   PF decrease to TID OU -- start taper BID x1 week and QD x1 week, then stop   prolensa decrease to TID OU -- okay to stop   Cyclopentolate or tropicamide BID OU -- use as needed   Cosopt BID OU   Brimonidine BID OU  - cont Systane QID OU  - add lubricating gel/ointment QHS OU -- recommend Genteal Gel  - IOP 17, 15 -- improved on brim and cosopt, likely steroid response  - f/u in 1-2 weeks-- DFE/OCT  3,4. Hypertensive retinopathy OU - discussed importance of tight BP control - monitor  5. Diabetes mellitus, type 2 without retinopathy  - The incidence, risk factors for progression, natural history and treatment options for diabetic retinopathy  were discussed with patient.    - The need for close monitoring of blood glucose, blood pressure, and serum lipids, avoiding cigarette or any type of tobacco, and the need for long term follow up was also discussed with patient.  - f/u in 1 year, sooner prn  6. Mixed cataract OU  - The symptoms of cataract, surgical options, and treatments and risks were discussed with patient.  - discussed diagnosis and progression  - not yet visually significant  - monitor for now   Ophthalmic Meds Ordered this visit:  No orders of the defined types were placed in this encounter.     Return for f/u 1-2 weeks, Uveitis OU, DFE, OCT.  There are no Patient Instructions on file for this visit.  This document serves as a record of services personally performed by Gardiner Sleeper, MD, PhD. It was created on their behalf by San Jetty. Owens Shark, OA an ophthalmic technician. The creation of this record is the provider's dictation and/or activities during the visit.    Electronically signed by: San Jetty. Owens Shark, [place] 11.09.2021 1:42 AM  Gardiner Sleeper, M.D., Ph.D. Diseases & Surgery of the Retina and Vitreous [facility]  I have reviewed the above documentation for accuracy and completeness, and I agree with the above. Gardiner Sleeper, M.D., Ph.D. 04/19/20 1:42 AM  Abbreviations: M myopia (nearsighted); A astigmatism; H hyperopia (farsighted); P presbyopia; Mrx spectacle prescription;  CTL contact  lenses; OD right eye; OS left eye; OU both eyes  XT exotropia; ET esotropia; PEK punctate epithelial keratitis; PEE punctate epithelial erosions; DES dry eye syndrome; MGD meibomian gland dysfunction; ATs artificial tears; PFAT's preservative free artificial tears; [place] nuclear sclerotic cataract; PSC posterior subcapsular cataract; ERM epi-retinal membrane; PVD posterior vitreous detachment; RD retinal detachment; DM diabetes mellitus; DR diabetic retinopathy; NPDR non-proliferative diabetic retinopathy; PDR proliferative diabetic retinopathy; CSME clinically significant macular edema; DME diabetic macular edema; dbh dot blot hemorrhages; CWS cotton wool spot; POAG primary open angle glaucoma; C/D cup-to-disc ratio; HVF humphrey visual field; GVF goldmann visual field; OCT optical coherence tomography; IOP intraocular pressure; BRVO Branch retinal vein occlusion; CRVO central retinal vein occlusion; CRAO central retinal artery occlusion; BRAO branch retinal artery occlusion; RT retinal tear; SB scleral buckle; PPV pars plana vitrectomy; VH Vitreous hemorrhage; PRP panretinal laser photocoagulation; IVK intravitreal kenalog; VMT vitreomacular traction; MH Macular hole;  NVD neovascularization of the disc; NVE neovascularization elsewhere; AREDS age related eye disease study; ARMD age related macular degeneration; POAG primary open angle glaucoma; EBMD epithelial/anterior basement membrane dystrophy; ACIOL anterior chamber intraocular lens; IOL intraocular lens; PCIOL posterior chamber intraocular lens; Phaco/IOL phacoemulsification with intraocular lens placement; [place] photorefractive keratectomy; LASIK laser assisted in situ  keratomileusis; HTN hypertension; DM diabetes mellitus; COPD chronic obstructive pulmonary disease

## 2020-04-17 DIAGNOSIS — Z20822 Contact with and (suspected) exposure to covid-19: Secondary | ICD-10-CM | POA: Diagnosis not present

## 2020-04-22 ENCOUNTER — Other Ambulatory Visit (HOSPITAL_COMMUNITY): Payer: Self-pay | Admitting: Internal Medicine

## 2020-04-22 ENCOUNTER — Other Ambulatory Visit: Payer: Self-pay | Admitting: Internal Medicine

## 2020-04-22 MED FILL — ATORVASTATIN CALCIUM 20 MG: 20 | 90 days supply | Qty: 90 | Fill #0

## 2020-04-22 MED FILL — METOPROLOL SUCCINATE ER 25: 25 | 90 days supply | Qty: 45 | Fill #1

## 2020-04-23 ENCOUNTER — Other Ambulatory Visit (HOSPITAL_COMMUNITY): Payer: Self-pay | Admitting: Physical Medicine and Rehabilitation

## 2020-04-23 MED FILL — DIAZEPAM 10 MG TABS: 10 | 1 days supply | Qty: 2 | Fill #0

## 2020-04-23 NOTE — Progress Notes (Signed)
[facility] Note  04/27/2020     CHIEF COMPLAINT Patient presents for Retina Follow Up   HISTORY OF PRESENT ILLNESS: [name] is a 49 y.o. female who presents to the clinic today for:   HPI    Retina Follow Up    Patient presents with  Other.  In both eyes.  This started weeks ago.  Severity is moderate.  Duration of weeks.  Since onset it is stable.  I, the attending physician,  performed the HPI with the patient and updated documentation appropriately.          Comments    Pt states her vision is the same.  Pt has feeling of pressure in her left eye today and feels like she may have the start of a flare up in her left eye.  Patient denies eye pain but feels like it may be starting, OS.  Pt denies any new or worsening floaters or fol OU.       Last edited by Bernarda Caffey, MD on 04/27/2020  1:26 PM. 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 brimonidine (ALPHAGAN) 0.15 % ophthalmic solution Place 1 drop into both eyes in the morning and at bedtime.  . Bromfenac Sodium (PROLENSA) 0.07 % SOLN Place 1 drop into both eyes 4 (four) times daily.  . cyclopentolate (CYCLODRYL,CYCLOGYL) 1 % ophthalmic solution 1 drop 2 (two) times daily.  . dorzolamide-timolol (COSOPT) 22.3-6.8 MG/ML ophthalmic solution Place 1 drop into both eyes 2 (two)  times daily.  . ISOPTO ATROPINE 1 % ophthalmic solution   . prednisoLONE acetate (PRED FORTE) 1 % ophthalmic suspension Place 1 drop into both eyes every hour. (Patient taking differently: Place 1 drop into both eyes every 2 (two) hours while awake. )   No current facility-administered medications for this visit. (Ophthalmic Drugs)   Current Outpatient Medications (Other)  Medication Sig  . albuterol (VENTOLIN HFA) 108 (90 Base) MCG/ACT inhaler Inhale 2 puffs into the lungs every 6 (six) hours as needed for wheezing or shortness of breath.  . ALPRAZolam (XANAX) 0.5 MG tablet Take 1 tablet (0.5 mg total) by mouth 3 (three) times daily as needed (Headache).  [name] aspirin EC 81 MG tablet Take 81 mg by mouth daily.  [name] atorvastatin (LIPITOR) 20 MG tablet TAKE 1 TABLET BY MOUTH DAILY.  . benzonatate (TESSALON) 100 MG capsule Take 1 capsule (100 mg total) by mouth 3 (three) times daily as needed for cough.  . Blood Glucose Monitoring Suppl (FREESTYLE LITE) DEVI 1 each by Does not apply route 2 (two) times daily. E11.9  . butalbital-acetaminophen-caffeine (FIORICET) 50-325-40 MG tablet Take 1 tablet by mouth every 6 (six) hours as needed for headache. Do not refill in less than 30 day  . celecoxib (CELEBREX) 200 MG capsule Take 1 capsule (200  mg total) by mouth 2 (two) times daily as needed.  . diazepam (VALIUM) 2 MG tablet Take 1 tablet (2 mg total) by mouth every 6 (six) hours as needed (Headache).  . diltiazem (CARDIZEM LA) 240 MG 24 hr tablet TAKE 1 TABLET BY MOUTH DAILY  . EPINEPHrine 0.3 mg/0.3 mL IJ SOAJ injection Inject 0.3 mLs (0.3 mg total) into the muscle as needed (for allergic reaction).  [name] ergocalciferol (DRISDOL) 1.25 MG (50000 UT) capsule Take 1 capsule (50,000 Units total) by mouth once a week.  . Fremanezumab-vfrm (AJOVY) 225 MG/1.5ML SOAJ Inject 675 mg into the skin every 3 (three) months.  [name] glucose blood (FREESTYLE LITE) test strip 1 each by Other route 2 (two) times daily. E11.9  .  hydrochlorothiazide (MICROZIDE) 12.5 MG capsule TAKE 1 CAPSULE BY MOUTH DAILY.  [name] insulin lispro (HUMALOG KWIKPEN) 100 UNIT/ML KwikPen Inject 0.2 mLs (20 Units total) into the skin 2 (two) times daily with a meal.  . Insulin Pen Needle (PEN NEEDLES 31GX5/16") 31G X 8 MM MISC 1 each by Does not apply route in the morning, at noon, in the evening, and at bedtime. E11.9  . ketorolac (TORADOL) 10 MG tablet Take 1 tablet (10 mg total) by mouth every 6 (six) hours as needed. (Patient taking differently: Take 10 mg by mouth every 6 (six) hours as needed (Headache). )  . Lancets (FREESTYLE) lancets 1 each by Other route 2 (two) times daily. E11.9  . losartan-hydrochlorothiazide (HYZAAR) 50-12.5 MG tablet Take 1 tablet by mouth daily.  . metFORMIN (GLUCOPHAGE-XR) 500 MG 24 hr tablet Take 2 tablets (1,000 mg total) by mouth 2 (two) times daily. (Patient taking differently: Take 1,000 mg by mouth daily. )  . metoprolol succinate (TOPROL-XL) 25 MG 24 hr tablet TAKE 1/2 TABLET BY MOUTH DAILY.  [name] ondansetron (ZOFRAN ODT) 4 MG disintegrating tablet Take 1 tablet (4 mg total) by mouth every 8 (eight) hours as needed for nausea or vomiting.  [name] oxyCODONE-acetaminophen (PERCOCET/ROXICET) 5-325 MG tablet Take by mouth as needed for severe pain.  . Semaglutide, 1 MG/DOSE, (OZEMPIC, 1 MG/DOSE,) 2 MG/1.5ML SOPN Inject 0.75 mLs (1 mg total) into the skin once a week.  . TRESIBA FLEXTOUCH 100 UNIT/ML FlexTouch Pen INJECT 0.6 MLS (60 UNITS TOTAL) INTO THE SKIN DAILY.   No current facility-administered medications for this visit. (Other)      REVIEW OF SYSTEMS: ROS    Positive for: Neurological, Musculoskeletal, Endocrine, Cardiovascular, Eyes, Psychiatric   Negative for: Constitutional, Gastrointestinal, Skin, Genitourinary, HENT, Respiratory, Allergic/Imm, Heme/Lymph   Last edited by Doneen Poisson on 04/27/2020  9:42 AM. (History)       ALLERGIES Allergies  Allergen Reactions  . Bamlanivimab Anaphylaxis,  Itching, Palpitations, Rash, Shortness Of Breath and Swelling  . Botox [Onabotulinumtoxina] Shortness Of Breath    syncope  . Clindamycin/Lincomycin Other (See Comments)  . Kiwi Extract Shortness Of Breath  . Maxalt [Rizatriptan Benzoate] Anaphylaxis    Chest pain  . Nitrous Oxide Shortness Of Breath    syncope  . Triptans Shortness Of Breath  . Aspirin Nausea And Vomiting    Mouth blisters  . Contrast Media [Iodinated Diagnostic Agents]   . Flagyl [Metronidazole]   . Hydrocodone-Acetaminophen Other (See Comments)  . Latex     Sometimes causes rash  . Mango Flavor   . Rizatriptan Other (See Comments)  . Vicodin [Hydrocodone-Acetaminophen]     hallucinations  . Flu Virus Vaccine Palpitations  . Penicillins Nausea And Vomiting, Rash and Other (See  Comments)    Did it involve swelling of the face/tongue/throat, SOB, or low BP? No Did it involve sudden or severe rash/hives, skin peeling, or any reaction on the inside of your mouth or nose? No Did you need to seek medical attention at a hospital or doctor's office? Yes When did it last happen?patient was 49 years old If all above answers are "NO", may proceed with cephalosporin use.  . Tetracyclines & Related Nausea And Vomiting and Rash    PAST MEDICAL HISTORY Past Medical History:  Diagnosis Date  . Allergy   . Anemia   . Anxiety    claustrophobic  . Asthma   . Back pain   . Biceps tendonosis of right shoulder   . Diabetes mellitus 2011   did not start metforfin, losing weight  . Dyspnea   . Fatty liver   . Food allergy   . Headache disorder   . History of concussion   . HLD (hyperlipidemia)   . Hypertension   . IBS (irritable bowel syndrome)   . Infertility, female   . Joint pain   . Lactose intolerance   . Leg edema   . Migraines   . Other specified disorders of thyroid   . Palpitation   . Post-menopausal   . Seborrheic dermatitis   . Shoulder impingement syndrome, right   . Vitamin D deficiency     Past Surgical History:  Procedure Laterality Date  . ABDOMINAL HYSTERECTOMY  2006   heavy menses, endometriosis, l oophrectomy  . BACK SURGERY    . BREAST BIOPSY Right 2018   benign  . BREAST EXCISIONAL BIOPSY Right 2003   benign  . BREAST EXCISIONAL BIOPSY Right 1999   benign  . BREAST SURGERY     right breast x 2 , benign  . HERNIA REPAIR  2003   left inguinal   . LEFT OOPHORECTOMY    . SHOULDER ARTHROSCOPY WITH SUBACROMIAL DECOMPRESSION AND OPEN ROTATOR C Right 07/14/2016   Procedure: RIGHT SHOULDER ARTHROSCOPY WITH SUBACROMIAL DECOMPRESSION, DISTAL CLAVICLE RESECTION AND MINI OPEN ROTATOR CUFF REPAIR, OPEN BICEP TENDODESIS;  Surgeon: Garald Balding, MD;  Location: [facility];  Service: Orthopedics;  Laterality: Right;  . SHOULDER CLOSED REDUCTION Right 09/08/2016   Procedure: RIGHT CLOSED MANIPULATION SHOULDER;  Surgeon: Garald Balding, MD;  Location: [facility];  Service: Orthopedics;  Laterality: Right;  . TUBAL LIGATION      FAMILY HISTORY Family History  Problem Relation Age of Onset  . Diabetes Mother   . Heart disease Mother 55  . Hypertension Mother   . Hyperlipidemia Mother   . Heart failure Mother   . Stroke Mother   . Thyroid disease Mother   . Depression Mother   . Sleep apnea Mother   . Obesity Mother   . Cancer Father 60       Lung Cancer  . Heart disease Father   . Mental illness Sister        bipolar, substance abuse,  clean 2 yrs  . Hypertension Sister   . Cancer Paternal Grandmother 40       breast cancer  . Breast cancer Paternal Grandmother   . Diabetes Maternal Grandmother   . Hypertension Maternal Grandmother   . Diabetes Son     SOCIAL HISTORY Social History   Tobacco Use  . Smoking status: Never Smoker  . Smokeless tobacco: Never Used  Vaping Use  . Vaping Use: Never used  Substance Use Topics  .  Alcohol use: No  . Drug use: No         OPHTHALMIC EXAM:  Base Eye Exam    Visual Acuity  (Snellen - Linear)      Right Left   Dist cc 20/50 -1 20/40 +2   Dist ph cc 20/50 +1 NI   Correction: Glasses       Tonometry (Tonopen, 9:48 AM)      Right Left   Pressure 13 15       Pupils      Dark Light Shape React APD   Right 5 4 Round Brisk 0   Left 5 4 Round Brisk 0       Visual Fields      Left Right    Full Full       Extraocular Movement      Right Left    Full Full       Neuro/Psych    Oriented x3: Yes   Mood/Affect: Normal       Dilation    Both eyes: 1.0% Mydriacyl, 2.5% Phenylephrine @ 9:48 AM        Slit Lamp and Fundus Exam    Slit Lamp Exam      Right Left   Lids/Lashes Normal Normal   Conjunctiva/Sclera Melanosis, trace Injection Melanosis, no injection   Cornea arcus, trace Punctate epithelial erosions, Debris in tear film, decreased TBUT arcus, 2+ inferior Punctate epithelial erosions, decreased TBUT   Anterior Chamber Deep and quiet Deep, 1+fine Cell/pigment   Iris Round and dilated Round and reactive   Lens 2+ Nuclear sclerosis, 2+ Cortical cataract 2+ Nuclear sclerosis, 2+ Cortical cataract   Vitreous no cell or vitritis, Vitreous syneresis no cell or vitritis, Vitreous syneresis       Fundus Exam      Right Left   Disc Pink and Sharp, +cupping Pink and Sharp, +cupping, mild PPP   C/D Ratio 0.6 0.6   Macula Flat, Good foveal reflex, RPE mottling, No heme or edema Flat, Good foveal reflex, RPE mottling, No heme or edema   Vessels mild attenuation, mild AV crossing changes mild attenuation   Periphery Attached, mild White without pressure temporally Attached, mild White without pressure        Refraction    Wearing Rx      Sphere Cylinder Axis Add   Right +1.00 +1.00 056 +1.25   Left +0.75 +1.25 132 +1.25          IMAGING AND PROCEDURES  Imaging and Procedures for 04/27/2020  OCT, Retina - OU - Both Eyes       Right Eye Quality was good. Central Foveal Thickness: 246. Progression has been stable. Findings include  normal foveal contour, no IRF, no SRF, vitreomacular adhesion .   Left Eye Quality was good. Central Foveal Thickness: 239. Progression has been stable. Findings include normal foveal contour, no IRF, no SRF, vitreomacular adhesion .   Notes *Images captured and stored on drive  Diagnosis / Impression:  NFP; no IRF/SRF  Clinical management:  See below  Abbreviations: NFP - Normal foveal profile. CME - cystoid macular edema. PED - pigment epithelial detachment. IRF - intraretinal fluid. SRF - subretinal fluid. EZ - ellipsoid zone. ERM - epiretinal membrane. ORA - outer retinal atrophy. ORT - outer retinal tubulation. SRHM - subretinal hyper-reflective material. 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 Retinal edema  H35.81 OCT, Retina - OU - Both Eyes  3. Essential hypertension  I10   4. Hypertensive retinopathy of both eyes  H35.033   5. Diabetes mellitus type 2 without retinopathy ([place])  E11.9   6. Combined forms of age-related cataract of both eyes  H25.813     1,2. Anterior uveitis OU  - eye pain, redness, photophobia since Dec 2020, after getting COVID infection (was hospitalized for 2 wks)  - was previously managed by [facility]  - underwent limited lab work up on 06/21/19   Quant-Gold, ACE, ANA, HLA-B27 -- all normal   ESR slightly elevated (37)  - initial exam here 08.18.21 -- +conj injection OU (OD > OS); 1-2+ AC cell OU; no evidence of posterior involvement  - today, BCVA OD 20/50;  20/40 OS:+dryness and exam shows stable improvement in inflammation, but persistent corneal findings -- +PEE OU (OS > OD today)  - suspect most of pt's symptoms related to cornea more than anterior uveitis -- working long hours in front of computer  - OCT without macular edema OU  - FA 08.25.21 shows Mild focal late perivascular leakage temporal periphery OU -- ?inflammatory vs HTN  - IOP  13,15 -- improved on brim and cosopt, likely steroid response  - Drops:   prolensa PRN OU -- okay to use for eye pain PRN   Cyclopentolate or tropicamide BID OU -- PRN   STOP Cosopt BID OU   Cont Brimonidine BID OU   cont Systane QID OU   cont lubricating gel/ointment QHS OU -- recommend Genteal Gel  - f/u in 2-3 months, sooner prn-- DFE/OCT  3,4. Hypertensive retinopathy OU - discussed importance of tight BP control - monitor  5. Diabetes mellitus, type 2 without retinopathy  - The incidence, risk factors for progression, natural history and treatment options for diabetic retinopathy  were discussed with patient.    - The need for close monitoring of blood glucose, blood pressure, and serum lipids, avoiding cigarette or any type of tobacco, and the need for long term follow up was also discussed with patient.  - f/u in 1 year, sooner prn  6. Mixed cataract OU  - The symptoms of cataract, surgical options, and treatments and risks were discussed with patient.  - discussed diagnosis and progression  - not yet visually significant  - monitor for now   Ophthalmic Meds Ordered this visit:  No orders of the defined types were placed in this encounter.     Return for f/u 2-3 months, Uveitis OU, DFE, OCT.  There are no Patient Instructions on file for this visit.  This document serves as a record of services personally performed by Gardiner Sleeper, MD, PhD. It was created on their behalf by Leeann Must, [place], an ophthalmic technician. The creation of this record is the provider's dictation and/or activities during the visit.    Electronically signed by: Leeann Must, COA [email or web address]$ @ 1:29 PM  Gardiner Sleeper, M.D., Ph.D. Diseases & Surgery of the Retina and [place] 04/27/2020   I have reviewed the above documentation for accuracy and completeness, and I agree with the above. Gardiner Sleeper, M.D., Ph.D. 04/27/20 1:29 PM  Abbreviations: M  myopia (nearsighted); A astigmatism; H hyperopia (farsighted); P presbyopia; Mrx spectacle prescription;  CTL contact lenses; OD right eye; OS left eye; OU both eyes  XT exotropia; ET esotropia; PEK punctate epithelial  keratitis; PEE punctate epithelial erosions; DES dry eye syndrome; MGD meibomian gland dysfunction; ATs artificial tears; PFAT's preservative free artificial tears; [place] nuclear sclerotic cataract; PSC posterior subcapsular cataract; ERM epi-retinal membrane; PVD posterior vitreous detachment; RD retinal detachment; DM diabetes mellitus; DR diabetic retinopathy; NPDR non-proliferative diabetic retinopathy; PDR proliferative diabetic retinopathy; CSME clinically significant macular edema; DME diabetic macular edema; dbh dot blot hemorrhages; CWS cotton wool spot; POAG primary open angle glaucoma; C/D cup-to-disc ratio; HVF humphrey visual field; GVF goldmann visual field; OCT optical coherence tomography; IOP intraocular pressure; BRVO Branch retinal vein occlusion; CRVO central retinal vein occlusion; CRAO central retinal artery occlusion; BRAO branch retinal artery occlusion; RT retinal tear; SB scleral buckle; PPV pars plana vitrectomy; VH Vitreous hemorrhage; PRP panretinal laser photocoagulation; IVK intravitreal kenalog; VMT vitreomacular traction; MH Macular hole;  NVD neovascularization of the disc; NVE neovascularization elsewhere; AREDS age related eye disease study; ARMD age related macular degeneration; POAG primary open angle glaucoma; EBMD epithelial/anterior basement membrane dystrophy; ACIOL anterior chamber intraocular lens; IOL intraocular lens; PCIOL posterior chamber intraocular lens; Phaco/IOL phacoemulsification with intraocular lens placement; [place] photorefractive keratectomy; LASIK laser assisted in situ keratomileusis; HTN hypertension; DM diabetes mellitus; COPD chronic obstructive pulmonary disease

## 2020-04-27 ENCOUNTER — Other Ambulatory Visit: Payer: Self-pay

## 2020-04-27 ENCOUNTER — Ambulatory Visit: Payer: 59 | Admitting: Endocrinology

## 2020-04-27 ENCOUNTER — Encounter (INDEPENDENT_AMBULATORY_CARE_PROVIDER_SITE_OTHER): Payer: Self-pay | Admitting: Ophthalmology

## 2020-04-27 ENCOUNTER — Ambulatory Visit (INDEPENDENT_AMBULATORY_CARE_PROVIDER_SITE_OTHER): Payer: 59 | Admitting: Ophthalmology

## 2020-04-27 VITALS — BP 130/88 | HR 111 | Ht 66.0 in | Wt 212.8 lb

## 2020-04-27 DIAGNOSIS — I10 Essential (primary) hypertension: Secondary | ICD-10-CM

## 2020-04-27 DIAGNOSIS — H25813 Combined forms of age-related cataract, bilateral: Secondary | ICD-10-CM

## 2020-04-27 DIAGNOSIS — H35033 Hypertensive retinopathy, bilateral: Secondary | ICD-10-CM | POA: Diagnosis not present

## 2020-04-27 DIAGNOSIS — H3581 Retinal edema: Secondary | ICD-10-CM | POA: Diagnosis not present

## 2020-04-27 DIAGNOSIS — Z794 Long term (current) use of insulin: Secondary | ICD-10-CM | POA: Diagnosis not present

## 2020-04-27 DIAGNOSIS — E119 Type 2 diabetes mellitus without complications: Secondary | ICD-10-CM

## 2020-04-27 DIAGNOSIS — H209 Unspecified iridocyclitis: Secondary | ICD-10-CM | POA: Diagnosis not present

## 2020-04-27 DIAGNOSIS — E1165 Type 2 diabetes mellitus with hyperglycemia: Secondary | ICD-10-CM | POA: Diagnosis not present

## 2020-04-27 MED ORDER — METFORMIN HCL ER 500 MG PO TB24: 1000.0000 mg | ORAL_TABLET | Freq: Every day | ORAL | 3 refills | Status: DC

## 2020-04-27 MED ORDER — TRESIBA FLEXTOUCH 100 UNIT/ML ~~LOC~~ SOPN: 10.0000 [IU] | PEN_INJECTOR | Freq: Every day | SUBCUTANEOUS | 0 refills | Status: DC

## 2020-04-27 NOTE — Patient Instructions (Addendum)
Please reduce the Tresiba to 10 units daily, and:  Please continue the same Humalog.  check your blood sugar twice a day.  vary the time of day when you check, between before the 3 meals, and at bedtime.  also check if you have symptoms of your blood sugar being too high or too low.  please keep a record of the readings and bring it to your next appointment here (or you can bring the meter itself).  You can write it on any piece of paper.  please call us sooner if your blood sugar goes below 70, or if you have a lot of readings over 200.  After the steroid shot, take 5 extra units of humalog for any blood sugar in the 200's, and 10 extra for any over 300.   Please come back for a follow-up appointment in approx 2-3 months.

## 2020-04-27 NOTE — Progress Notes (Signed)
Subjective:    Patient ID: [name], female    DOB: [birth year], 49 y.o.   MRN: [id number]  HPI Pt returns for f/u of diabetes mellitus: DM type: Insulin-requiring type 2 Dx'ed: 6433 Complications: DN Therapy: insulin since 2018, Ozempic, and metformin.   GDM: 1990 and 2004 DKA: never Severe hypoglycemia: never Pancreatitis: never Pancreatic imaging: normal on 2019 CT.  SDOH: none Other: She did not tolerate Jardiance (vaginitis) or Victoza (dysphagia); she takes multiple daily injections (but she takes humalog BID, as she eats 2 meals per day); she works 1st shift.   Interval history: no recent steroids.  Pt says fasting cbg's vary from 91-140.   Pt says she will have a steroid injection into the neck tomorrow. Pt says the [name] is 20 units qd.   Past Medical History:  Diagnosis Date  . Allergy   . Anemia   . Anxiety    claustrophobic  . Asthma   . Back pain   . Biceps tendonosis of right shoulder   . Diabetes mellitus 2011   did not start metforfin, losing weight  . Dyspnea   . Fatty liver   . Food allergy   . Headache disorder   . History of concussion   . HLD (hyperlipidemia)   . Hypertension   . IBS (irritable bowel syndrome)   . Infertility, female   . Joint pain   . Lactose intolerance   . Leg edema   . Migraines   . Other specified disorders of thyroid   . Palpitation   . Post-menopausal   . Seborrheic dermatitis   . Shoulder impingement syndrome, right   . Vitamin D deficiency     Past Surgical History:  Procedure Laterality Date  . ABDOMINAL HYSTERECTOMY  2006   heavy menses, endometriosis, l oophrectomy  . BACK SURGERY    . BREAST BIOPSY Right 2018   benign  . BREAST EXCISIONAL BIOPSY Right 2003   benign  . BREAST EXCISIONAL BIOPSY Right 1999   benign  . BREAST SURGERY     right breast x 2 , benign  . HERNIA REPAIR  2003   left inguinal   . LEFT OOPHORECTOMY    . SHOULDER ARTHROSCOPY WITH SUBACROMIAL DECOMPRESSION AND OPEN  ROTATOR C Right 07/14/2016   Procedure: RIGHT SHOULDER ARTHROSCOPY WITH SUBACROMIAL DECOMPRESSION, DISTAL CLAVICLE RESECTION AND MINI OPEN ROTATOR CUFF REPAIR, OPEN BICEP TENDODESIS;  Surgeon: Garald Balding, MD;  Location: [facility];  Service: Orthopedics;  Laterality: Right;  . SHOULDER CLOSED REDUCTION Right 09/08/2016   Procedure: RIGHT CLOSED MANIPULATION SHOULDER;  Surgeon: Garald Balding, MD;  Location: [facility];  Service: Orthopedics;  Laterality: Right;  . TUBAL LIGATION      Social History   Socioeconomic History  . Marital status: Divorced    Spouse name: Not on file  . Number of children: 2  . Years of education: 55  . Highest education level: Master's degree (e.g., MA, MS, MEng, MEd, MSW, MBA)  Occupational History  . 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 Smoking status: Never Smoker  . Smokeless tobacco: Never Used  Vaping Use  . Vaping Use: Never used  Substance and Sexual Activity  . Alcohol use: No  . Drug use: No  . Sexual activity: Not Currently    Partners: Male    Birth control/protection: Surgical  Other Topics Concern  . Not on file  Social  History Narrative   Lives at home with son and daughter.   Right-handed.   1-3 cups caffeine weekly.   Social Determinants of Health   Financial Resource Strain:   . Difficulty of Paying Living Expenses: Not on file  Food Insecurity:   . Worried About Charity fundraiser in the Last Year: Not on file  . Ran Out of Food in the Last Year: Not on file  Transportation Needs:   . Lack of Transportation (Medical): Not on file  . Lack of Transportation (Non-Medical): Not on file  Physical Activity:   . Days of Exercise per Week: Not on file  . Minutes of Exercise per Session: Not on file  Stress:   . Feeling of Stress : Not on file  Social Connections:   . Frequency of Communication with Friends and Family: Not on file  . Frequency of Social  Gatherings with Friends and Family: Not on file  . Attends Religious Services: Not on file  . Active Member of Clubs or Organizations: Not on file  . Attends Archivist Meetings: Not on file  . Marital Status: Not on file  Intimate Partner Violence:   . Fear of Current or Ex-Partner: Not on file  . Emotionally Abused: Not on file  . Physically Abused: Not on file  . Sexually Abused: Not on file    Current Outpatient Medications on File Prior to Visit  Medication Sig Dispense Refill  . albuterol (VENTOLIN HFA) 108 (90 Base) MCG/ACT inhaler Inhale 2 puffs into the lungs every 6 (six) hours as needed for wheezing or shortness of breath. 18 g 0  . ALPRAZolam (XANAX) 0.5 MG tablet Take 1 tablet (0.5 mg total) by mouth 3 (three) times daily as needed (Headache).    [name] aspirin EC 81 MG tablet Take 81 mg by mouth daily.    [name] atorvastatin (LIPITOR) 20 MG tablet TAKE 1 TABLET BY MOUTH DAILY. 90 tablet 3  . Blood Glucose Monitoring Suppl (FREESTYLE LITE) DEVI 1 each by Does not apply route 2 (two) times daily. E11.9 1 each 0  . brimonidine (ALPHAGAN) 0.15 % ophthalmic solution Place 1 drop into both eyes in the morning and at bedtime.    . Bromfenac Sodium (PROLENSA) 0.07 % SOLN Place 1 drop into both eyes 4 (four) times daily. 6 mL 3  . butalbital-acetaminophen-caffeine (FIORICET) 50-325-40 MG tablet Take 1 tablet by mouth every 6 (six) hours as needed for headache. Do not refill in less than 30 day 30 tablet 1  . celecoxib (CELEBREX) 200 MG capsule Take 1 capsule (200 mg total) by mouth 2 (two) times daily as needed. 45 capsule 0  . cyclopentolate (CYCLODRYL,CYCLOGYL) 1 % ophthalmic solution 1 drop 2 (two) times daily.    . diazepam (VALIUM) 2 MG tablet Take 1 tablet (2 mg total) by mouth every 6 (six) hours as needed (Headache). 30 tablet 5  . diltiazem (CARDIZEM LA) 240 MG 24 hr tablet TAKE 1 TABLET BY MOUTH DAILY 90 tablet 1  . dorzolamide-timolol (COSOPT) 22.3-6.8 MG/ML ophthalmic  solution Place 1 drop into both eyes 2 (two) times daily. 10 mL 1  . EPINEPHrine 0.3 mg/0.3 mL IJ SOAJ injection Inject 0.3 mLs (0.3 mg total) into the muscle as needed (for allergic reaction). 1 each 3  . Fremanezumab-vfrm (AJOVY) 225 MG/1.5ML SOAJ Inject 675 mg into the skin every 3 (three) months. 3 pen 3  . glucose blood (FREESTYLE LITE) test strip 1 each by Other route 2 (  two) times daily. E11.9 200 each 0  . hydrochlorothiazide (MICROZIDE) 12.5 MG capsule TAKE 1 CAPSULE BY MOUTH DAILY. 90 capsule 0  . insulin lispro (HUMALOG KWIKPEN) 100 UNIT/ML KwikPen Inject 0.2 mLs (20 Units total) into the skin 2 (two) times daily with a meal. 67.5 mL 0  . Insulin Pen Needle (PEN NEEDLES 31GX5/16") 31G X 8 MM MISC 1 each by Does not apply route in the morning, at noon, in the evening, and at bedtime. E11.9 360 each 0  . ISOPTO ATROPINE 1 % ophthalmic solution     . Lancets (FREESTYLE) lancets 1 each by Other route 2 (two) times daily. E11.9 200 each 0  . losartan-hydrochlorothiazide (HYZAAR) 50-12.5 MG tablet Take 1 tablet by mouth daily. 90 tablet 3  . metoprolol succinate (TOPROL-XL) 25 MG 24 hr tablet TAKE 1/2 TABLET BY MOUTH DAILY. 45 tablet 2  . ondansetron (ZOFRAN ODT) 4 MG disintegrating tablet Take 1 tablet (4 mg total) by mouth every 8 (eight) hours as needed for nausea or vomiting. 20 tablet 0  . oxyCODONE-acetaminophen (PERCOCET/ROXICET) 5-325 MG tablet Take by mouth as needed for severe pain.    . Semaglutide, 1 MG/DOSE, (OZEMPIC, 1 MG/DOSE,) 2 MG/1.5ML SOPN Inject 0.75 mLs (1 mg total) into the skin once a week. 3 pen 3   No current facility-administered medications on file prior to visit.    Allergies  Allergen Reactions  . Bamlanivimab Anaphylaxis, Itching, Palpitations, Rash, Shortness Of Breath and Swelling  . Botox [Onabotulinumtoxina] Shortness Of Breath    syncope  . Clindamycin/Lincomycin Other (See Comments)  . Kiwi Extract Shortness Of Breath  . Maxalt [Rizatriptan Benzoate]  Anaphylaxis    Chest pain  . Nitrous Oxide Shortness Of Breath    syncope  . Triptans Shortness Of Breath  . Aspirin Nausea And Vomiting    Mouth blisters  . Contrast Media [Iodinated Diagnostic Agents]   . Flagyl [Metronidazole]   . Hydrocodone-Acetaminophen Other (See Comments)  . Latex     Sometimes causes rash  . Mango Flavor   . Rizatriptan Other (See Comments)  . Vicodin [Hydrocodone-Acetaminophen]     hallucinations  . Flu Virus Vaccine Palpitations  . Penicillins Nausea And Vomiting, Rash and Other (See Comments)    Did it involve swelling of the face/tongue/throat, SOB, or low BP? No Did it involve sudden or severe rash/hives, skin peeling, or any reaction on the inside of your mouth or nose? No Did you need to seek medical attention at a hospital or doctor's office? Yes When did it last happen?patient was 49 years old If all above answers are "NO", may proceed with cephalosporin use.  . Tetracyclines & Related Nausea And Vomiting and Rash    Family History  Problem Relation Age of Onset  . Diabetes Mother   . Heart disease Mother 66  . Hypertension Mother   . Hyperlipidemia Mother   . Heart failure Mother   . Stroke Mother   . Thyroid disease Mother   . Depression Mother   . Sleep apnea Mother   . Obesity Mother   . Cancer Father 57       Lung Cancer  . Heart disease Father   . Mental illness Sister        bipolar, substance abuse,  clean 2 yrs  . Hypertension Sister   . Cancer Paternal Grandmother 87       breast cancer  . Breast cancer Paternal Grandmother   . Diabetes Maternal Grandmother   .  Hypertension Maternal Grandmother   . Diabetes Son     BP 130/88   Pulse (!) 111   Ht 5\' 6"  (1.676 m)   Wt 212 lb 12.8 oz (96.5 kg)   SpO2 97%   BMI 34.35 kg/m    Review of Systems She denies hypoglycemia.    Objective:   Physical Exam VITAL SIGNS:  See vs page GENERAL: no distress Pulses: dorsalis pedis intact bilat.   MSK: no deformity of  the feet CV: no leg edema Skin:  no ulcer on the feet.  normal color and temp on the feet. Neuro: sensation is intact to touch on the feet.    A1c=6.0%   Lab Results  Component Value Date   CREATININE 0.73 06/08/2019   BUN 26 (H) 06/08/2019   NA 134 (L) 06/08/2019   K 4.4 06/08/2019   CL 100 06/08/2019   CO2 21 (L) 06/08/2019   Lab Results  Component Value Date   TSH 0.68 07/31/2019   T3TOTAL 111 01/23/2018   T4TOTAL 5.8 07/31/2019       Assessment & Plan:  Insulin-requiring type 2 DM,with DN: overcontrolled  Patient Instructions  Please reduce the Tresiba to 10 units daily, and:  Please continue the same Humalog.  check your blood sugar twice a day.  vary the time of day when you check, between before the 3 meals, and at bedtime.  also check if you have symptoms of your blood sugar being too high or too low.  please keep a record of the readings and bring it to your next appointment here (or you can bring the meter itself).  You can write it on any piece of paper.  please call us sooner if your blood sugar goes below 70, or if you have a lot of readings over 200.  After the steroid shot, take 5 extra units of humalog for any blood sugar in the 200's, and 10 extra for any over 300.   Please come back for a follow-up appointment in approx 2-3 months.

## 2020-05-08 ENCOUNTER — Other Ambulatory Visit: Payer: Self-pay | Admitting: Internal Medicine

## 2020-05-08 DIAGNOSIS — Z1231 Encounter for screening mammogram for malignant neoplasm of breast: Secondary | ICD-10-CM

## 2020-05-18 ENCOUNTER — Encounter: Payer: Self-pay | Admitting: Obstetrics & Gynecology

## 2020-05-18 ENCOUNTER — Other Ambulatory Visit: Payer: Self-pay

## 2020-05-18 ENCOUNTER — Ambulatory Visit (INDEPENDENT_AMBULATORY_CARE_PROVIDER_SITE_OTHER): Payer: 59 | Admitting: Obstetrics & Gynecology

## 2020-05-18 ENCOUNTER — Other Ambulatory Visit (HOSPITAL_COMMUNITY)
Admission: RE | Admit: 2020-05-18 | Discharge: 2020-05-18 | Disposition: A | Discharge status: Home / Self Care | Payer: 59 | Source: Ambulatory Visit | Attending: Obstetrics & Gynecology | Admitting: Obstetrics & Gynecology

## 2020-05-18 VITALS — BP 134/86 | HR 76 | Ht 66.0 in | Wt 210.6 lb

## 2020-05-18 DIAGNOSIS — Z01419 Encounter for gynecological examination (general) (routine) without abnormal findings: Secondary | ICD-10-CM | POA: Insufficient documentation

## 2020-05-18 NOTE — Patient Instructions (Signed)

## 2020-05-18 NOTE — Progress Notes (Signed)
GYNECOLOGY ANNUAL PREVENTATIVE CARE ENCOUNTER NOTE  History:     [name] is a 49 y.o. G41P2010 female here for a routine annual gynecologic exam. She is s/p TAH/LSO for pelvic pain and AUB in 2006. Current complaints:none. Denies abnormal vaginal bleeding, discharge, pelvic pain, problems with intercourse or other gynecologic concerns   Denies abnormal vaginal bleeding, discharge, pelvic pain, problems with intercourse or other gynecologic concerns.    Gynecologic History No LMP recorded. Patient has had a hysterectomy. Last mammogram: 05/11/2019. Results were: normal. Next scheduled for 06/2020.  Obstetric History OB History  Gravida Para Term Preterm AB Living  3 2 2  0 1 0  SAB IAB Ectopic Multiple Live Births  0 0 1 0 0    # Outcome Date GA Lbr Len/2nd Weight Sex Delivery Anes PTL Lv  3 Term           2 Term           1 Ectopic             Past Medical History:  Diagnosis Date  . Allergy   . Anemia   . Anxiety    claustrophobic  . Asthma   . Back pain   . Biceps tendonosis of right shoulder   . Diabetes mellitus 2011   did not start metforfin, losing weight  . Dyspnea   . Fatty liver   . Food allergy   . Headache disorder   . History of concussion   . HLD (hyperlipidemia)   . Hypertension   . IBS (irritable bowel syndrome)   . Infertility, female   . Joint pain   . Lactose intolerance   . Leg edema   . Migraines   . Other specified disorders of thyroid   . Palpitation   . Post-menopausal   . Seborrheic dermatitis   . Shoulder impingement syndrome, right   . Vitamin D deficiency     Past Surgical History:  Procedure Laterality Date  . ABDOMINAL HYSTERECTOMY  2006   heavy menses, endometriosis, l oophrectomy  . BACK SURGERY    . BREAST BIOPSY Right 2018   benign  . BREAST EXCISIONAL BIOPSY Right 2003   benign  . BREAST EXCISIONAL BIOPSY Right 1999   benign  . BREAST SURGERY     right breast x 2 , benign  . HERNIA REPAIR  2003   left  inguinal   . LEFT OOPHORECTOMY    . SHOULDER ARTHROSCOPY WITH SUBACROMIAL DECOMPRESSION AND OPEN ROTATOR C Right 07/14/2016   Procedure: RIGHT SHOULDER ARTHROSCOPY WITH SUBACROMIAL DECOMPRESSION, DISTAL CLAVICLE RESECTION AND MINI OPEN ROTATOR CUFF REPAIR, OPEN BICEP TENDODESIS;  Surgeon: Garald Balding, MD;  Location: [facility];  Service: Orthopedics;  Laterality: Right;  . SHOULDER CLOSED REDUCTION Right 09/08/2016   Procedure: RIGHT CLOSED MANIPULATION SHOULDER;  Surgeon: Garald Balding, MD;  Location: [facility];  Service: Orthopedics;  Laterality: Right;  . TUBAL LIGATION      Current Outpatient Medications on File Prior to Visit  Medication Sig Dispense Refill  . albuterol (VENTOLIN HFA) 108 (90 Base) MCG/ACT inhaler Inhale 2 puffs into the lungs every 6 (six) hours as needed for wheezing or shortness of breath. 18 g 0  . ALPRAZolam (XANAX) 0.5 MG tablet Take 1 tablet (0.5 mg total) by mouth 3 (three) times daily as needed (Headache).    [name] aspirin EC 81 MG tablet Take 81 mg by mouth daily.    [name]  atorvastatin (LIPITOR) 20 MG tablet TAKE 1 TABLET BY MOUTH DAILY. 90 tablet 3  . Blood Glucose Monitoring Suppl (FREESTYLE LITE) DEVI 1 each by Does not apply route 2 (two) times daily. E11.9 1 each 0  . brimonidine (ALPHAGAN) 0.15 % ophthalmic solution Place 1 drop into both eyes in the morning and at bedtime.    . Bromfenac Sodium (PROLENSA) 0.07 % SOLN Place 1 drop into both eyes 4 (four) times daily. 6 mL 3  . butalbital-acetaminophen-caffeine (FIORICET) 50-325-40 MG tablet Take 1 tablet by mouth every 6 (six) hours as needed for headache. Do not refill in less than 30 day 30 tablet 1  . celecoxib (CELEBREX) 200 MG capsule Take 1 capsule (200 mg total) by mouth 2 (two) times daily as needed. 45 capsule 0  . cyclopentolate (CYCLODRYL,CYCLOGYL) 1 % ophthalmic solution 1 drop 2 (two) times daily.    . diazepam (VALIUM) 2 MG tablet Take 1 tablet (2 mg total) by mouth  every 6 (six) hours as needed (Headache). 30 tablet 5  . diltiazem (CARDIZEM LA) 240 MG 24 hr tablet TAKE 1 TABLET BY MOUTH DAILY 90 tablet 1  . dorzolamide-timolol (COSOPT) 22.3-6.8 MG/ML ophthalmic solution Place 1 drop into both eyes 2 (two) times daily. 10 mL 1  . EPINEPHrine 0.3 mg/0.3 mL IJ SOAJ injection Inject 0.3 mLs (0.3 mg total) into the muscle as needed (for allergic reaction). 1 each 3  . Fremanezumab-vfrm (AJOVY) 225 MG/1.5ML SOAJ Inject 675 mg into the skin every 3 (three) months. 3 pen 3  . hydrochlorothiazide (MICROZIDE) 12.5 MG capsule TAKE 1 CAPSULE BY MOUTH DAILY. 90 capsule 0  . losartan-hydrochlorothiazide (HYZAAR) 50-12.5 MG tablet Take 1 tablet by mouth daily. 90 tablet 3  . metFORMIN (GLUCOPHAGE-XR) 500 MG 24 hr tablet Take 2 tablets (1,000 mg total) by mouth daily. 180 tablet 3  . metoprolol succinate (TOPROL-XL) 25 MG 24 hr tablet TAKE 1/2 TABLET BY MOUTH DAILY. 45 tablet 2  . ondansetron (ZOFRAN ODT) 4 MG disintegrating tablet Take 1 tablet (4 mg total) by mouth every 8 (eight) hours as needed for nausea or vomiting. 20 tablet 0  . oxyCODONE-acetaminophen (PERCOCET/ROXICET) 5-325 MG tablet Take by mouth as needed for severe pain.    [name] glucose blood (FREESTYLE LITE) test strip 1 each by Other route 2 (two) times daily. E11.9 200 each 0  . insulin degludec (TRESIBA FLEXTOUCH) 100 UNIT/ML FlexTouch Pen Inject 10 Units into the skin daily. 6 mL 0  . insulin lispro (HUMALOG KWIKPEN) 100 UNIT/ML KwikPen Inject 0.2 mLs (20 Units total) into the skin 2 (two) times daily with a meal. 67.5 mL 0  . Insulin Pen Needle (PEN NEEDLES 31GX5/16") 31G X 8 MM MISC 1 each by Does not apply route in the morning, at noon, in the evening, and at bedtime. E11.9 360 each 0  . ISOPTO ATROPINE 1 % ophthalmic solution     . Lancets (FREESTYLE) lancets 1 each by Other route 2 (two) times daily. E11.9 200 each 0  . Semaglutide, 1 MG/DOSE, (OZEMPIC, 1 MG/DOSE,) 2 MG/1.5ML SOPN Inject 0.75 mLs (1 mg  total) into the skin once a week. 3 pen 3   No current facility-administered medications on file prior to visit.    Allergies  Allergen Reactions  . Bamlanivimab Anaphylaxis, Itching, Palpitations, Rash, Shortness Of Breath and Swelling  . Botox [Onabotulinumtoxina] Shortness Of Breath    syncope  . Clindamycin/Lincomycin Other (See Comments)  . Kiwi Extract Shortness Of Breath  . Maxalt [  Rizatriptan Benzoate] Anaphylaxis    Chest pain  . Nitrous Oxide Shortness Of Breath    syncope  . Triptans Shortness Of Breath  . Aspirin Nausea And Vomiting    Mouth blisters  . Contrast Media [Iodinated Diagnostic Agents]   . Flagyl [Metronidazole]   . Hydrocodone-Acetaminophen Other (See Comments)  . Latex     Sometimes causes rash  . Mango Flavor   . Rizatriptan Other (See Comments)  . Vicodin [Hydrocodone-Acetaminophen]     hallucinations  . Flu Virus Vaccine Palpitations  . Penicillins Nausea And Vomiting, Rash and Other (See Comments)    Did it involve swelling of the face/tongue/throat, SOB, or low BP? No Did it involve sudden or severe rash/hives, skin peeling, or any reaction on the inside of your mouth or nose? No Did you need to seek medical attention at a hospital or doctor's office? Yes When did it last happen?patient was 49 years old If all above answers are "NO", may proceed with cephalosporin use.  . Tetracyclines & Related Nausea And Vomiting and Rash    Social History:  reports that she has never smoked. She has never used smokeless tobacco. She reports that she does not drink alcohol and does not use drugs.  Family History  Problem Relation Age of Onset  . Diabetes Mother   . Heart disease Mother 70  . Hypertension Mother   . Hyperlipidemia Mother   . Heart failure Mother   . Stroke Mother   . Thyroid disease Mother   . Depression Mother   . Sleep apnea Mother   . Obesity Mother   . Cancer Father 68       Lung Cancer  . Heart disease Father   . Mental  illness Sister        bipolar, substance abuse,  clean 2 yrs  . Hypertension Sister   . Cancer Paternal Grandmother 28       breast cancer  . Breast cancer Paternal Grandmother   . Diabetes Maternal Grandmother   . Hypertension Maternal Grandmother   . Diabetes Son     The following portions of the patient's history were reviewed and updated as appropriate: allergies, current medications, past family history, past medical history, past social history, past surgical history and problem list.  Review of Systems Pertinent items noted in HPI and remainder of comprehensive ROS otherwise negative.  Physical Exam:  BP 134/86   Pulse 76   Ht 5\' 6"  (1.676 m)   Wt 210 lb 9.6 oz (95.5 kg)   BMI 33.99 kg/m  CONSTITUTIONAL: Well-developed, well-nourished female in no acute distress.  HENT:  Normocephalic, atraumatic, External right and left ear normal.  EYES: Conjunctivae and EOM are normal. Pupils are equal, round, and reactive to light. No scleral icterus.  NECK: Normal range of motion, supple, no masses.  Normal thyroid.  SKIN: Skin is warm and dry. No rash noted. Not diaphoretic. No erythema. No pallor. MUSCULOSKELETAL: Normal range of motion. No tenderness.  No cyanosis, clubbing, or edema.   NEUROLOGIC: Alert and oriented to person, place, and time. Normal reflexes, muscle tone coordination.  PSYCHIATRIC: Normal mood and affect. Normal behavior. Normal judgment and thought content. CARDIOVASCULAR: Normal heart rate noted, regular rhythm RESPIRATORY: Clear to auscultation bilaterally. Effort and breath sounds normal, no problems with respiration noted. BREASTS: Symmetric in size. No masses, tenderness, skin changes, nipple drainage, or lymphadenopathy bilaterally. Performed in the presence of a chaperone. ABDOMEN: Soft, no distention noted.  No tenderness, rebound or  guarding.  PELVIC: Normal appearing external genitalia and urethral meatus; normal appearing vaginal mucosa and cuff. No  irritation noted on vulva or vagina. Scant white discharge noted, sample obtained. No adnexal tenderness.  Performed in the presence of a chaperone.   Assessment and Plan:      1. Well woman exam with routine gynecological exam Normal exam. Desires annual STI check. Labs drawn, will follow up results and manage accordingly. - Cervicovaginal ancillary only - Hepatitis B surface antigen - Hepatitis C antibody - HIV Antibody (routine testing w rflx) - RPR Mammogram scheduled 06/2020. Routine preventative health maintenance measures emphasized. Please refer to After Visit Summary for other counseling recommendations.      Verita Schneiders, MD, [place] [facility] [employer], [place]

## 2020-05-19 LAB — HIV ANTIBODY (ROUTINE TESTING W REFLEX): HIV Screen 4th Generation wRfx: NONREACTIVE

## 2020-05-19 LAB — HEPATITIS B SURFACE ANTIGEN: Hepatitis B Surface Ag: NEGATIVE

## 2020-05-19 LAB — HEPATITIS C ANTIBODY: Hep C Virus Ab: <0.1 s/co ratio (ref 0.0–0.9)

## 2020-05-19 LAB — RPR: RPR Ser Ql: NONREACTIVE

## 2020-05-20 LAB — CERVICOVAGINAL ANCILLARY ONLY
Chlamydia: NEGATIVE
Comment: NEGATIVE
Comment: NEGATIVE
Comment: NORMAL
Neisseria Gonorrhea: NEGATIVE
Trichomonas: NEGATIVE

## 2020-06-03 MED FILL — OZEMPIC (1 MG/DOSE) 4 MG/3M: 4 | 84 days supply | Qty: 9 | Fill #2

## 2020-06-03 MED FILL — LOSARTAN-HCTZ 50-12.5 MG TA: 50-12.5 | 30 days supply | Qty: 30 | Fill #1

## 2020-06-03 MED FILL — UNIFINE PENTIPS 32GX5/32: 32G X 4 MM | 25 days supply | Qty: 100 | Fill #1

## 2020-06-15 ENCOUNTER — Other Ambulatory Visit (HOSPITAL_COMMUNITY): Payer: Self-pay | Admitting: Physical Medicine and Rehabilitation

## 2020-06-15 MED FILL — OXTELLAR XR 150 MG TABLET: 150 | 30 days supply | Qty: 30 | Fill #0

## 2020-06-19 ENCOUNTER — Other Ambulatory Visit: Payer: Self-pay

## 2020-06-19 ENCOUNTER — Other Ambulatory Visit: Payer: Self-pay | Admitting: Endocrinology

## 2020-06-19 ENCOUNTER — Ambulatory Visit
Admission: RE | Admit: 2020-06-19 | Discharge: 2020-06-19 | Disposition: A | Discharge status: Home / Self Care | Payer: 59 | Source: Ambulatory Visit | Attending: Internal Medicine | Admitting: Internal Medicine

## 2020-06-19 DIAGNOSIS — Z1231 Encounter for screening mammogram for malignant neoplasm of breast: Secondary | ICD-10-CM

## 2020-06-19 DIAGNOSIS — E1165 Type 2 diabetes mellitus with hyperglycemia: Secondary | ICD-10-CM

## 2020-06-19 MED FILL — METFORMIN HCL ER 500 MG TB2: 500 | 90 days supply | Qty: 360 | Fill #0

## 2020-06-19 MED FILL — HUMALOG 100 UNITS/ML KWIKPE: 100 | 4 days supply | Qty: 3 | Fill #3

## 2020-06-22 ENCOUNTER — Other Ambulatory Visit: Payer: Self-pay

## 2020-06-22 ENCOUNTER — Encounter: Payer: Self-pay | Admitting: Internal Medicine

## 2020-06-22 ENCOUNTER — Other Ambulatory Visit (HOSPITAL_COMMUNITY): Payer: Self-pay | Admitting: Internal Medicine

## 2020-06-22 ENCOUNTER — Telehealth (INDEPENDENT_AMBULATORY_CARE_PROVIDER_SITE_OTHER): Payer: 59 | Admitting: Internal Medicine

## 2020-06-22 VITALS — Ht 66.0 in | Wt 206.0 lb

## 2020-06-22 DIAGNOSIS — Z1211 Encounter for screening for malignant neoplasm of colon: Secondary | ICD-10-CM

## 2020-06-22 DIAGNOSIS — E1165 Type 2 diabetes mellitus with hyperglycemia: Secondary | ICD-10-CM | POA: Diagnosis not present

## 2020-06-22 DIAGNOSIS — H3023 Posterior cyclitis, bilateral: Secondary | ICD-10-CM | POA: Diagnosis not present

## 2020-06-22 DIAGNOSIS — K591 Functional diarrhea: Secondary | ICD-10-CM

## 2020-06-22 DIAGNOSIS — R1013 Epigastric pain: Secondary | ICD-10-CM | POA: Diagnosis not present

## 2020-06-22 DIAGNOSIS — R748 Abnormal levels of other serum enzymes: Secondary | ICD-10-CM | POA: Diagnosis not present

## 2020-06-22 DIAGNOSIS — E669 Obesity, unspecified: Secondary | ICD-10-CM

## 2020-06-22 MED ORDER — DICYCLOMINE HCL 20 MG PO TABS: 20.0000 mg | ORAL_TABLET | Freq: Four times a day (QID) | ORAL | 1 refills | Status: DC

## 2020-06-22 NOTE — Assessment & Plan Note (Signed)
Symptoms are suggestive of IBS  (prior diagnosis) and were relieved with antispasmodic.  Given her use of ozempic,  Need to check liver enzymes and lipase. Dicyclomine refilled. If dicyclomine continues to work and labs are normal, will defer U/S for now

## 2020-06-22 NOTE — Assessment & Plan Note (Signed)
Resolving after nearly a year of pain and inflammation  ?

## 2020-06-22 NOTE — Assessment & Plan Note (Signed)
I have congratulated her in reduction of   BMI and encouraged  Continued weight loss with goal of 10% of body weigh over the next 6 months using a low glycemic index diet and regular exercise a minimum of 5 days per week.    

## 2020-06-22 NOTE — Progress Notes (Signed)
Virtual Visit converted to telephone note   This visit type was conducted due to national recommendations for restrictions regarding the COVID-19 pandemic (e.g. social distancing).  This format is felt to be most appropriate for this patient at this time.  All issues noted in this document were discussed and addressed.  No physical exam was performed (except for noted visual exam findings with Video Visits).   I connected with@ on 06/22/20 at  4:00 PM EST by a video enabled telemedicine applicationand verified that I am speaking with the correct person using two identifiers. Location patient: home Location provider: work or home office Persons participating in the virtual visit: patient, provider  I discussed the limitations, risks, security and privacy concerns of performing an evaluation and management service by telephone and the availability of in person appointments. I also discussed with the patient that there may be a patient responsible charge related to this service. The patient expressed understanding and agreed to proceed.  Interactive audio and video telecommunications were attempted between this provider and patient, however failed, due to patient having technical difficulties.  We continued and completed visit with audio only.   Reason for visit: abd pain diarrhea ,   HPI:  50 yr old female with history of T2 DM, asthma, history of IBS/diarrhea, and   Uveitis following admission for hypoxic respiratrory  Failure due to COVID infection December 2020  IPresents with signs and symptoms suggestive of IBS flare.    Patient was diagnosed over 10 years ago .  Has had 3 flares.  Last episode 2018 (episodes triggered by stressful events).  Currently has been having post prandial abd pain and diarrhea occurring on a daily basis since Thanksgiving.  Denies fevers, constant abd pain , and  Blood in stools.  Has had some formed stools that have been normal appearing. Pain and diarrhea occurs  after every meal.  No blood in stool.   Some weight loss but averaging 2 lbs per month loss since starting Ozempic in September for obesity and weight loss.    Life is "very stressful" currently:  Working 70 hours per week at [employer] on the [place] while trying to finish her master's degree   Found an old dicyclomine tablet and  and symptoms resolved for 24 hours after taking the pill   No prior EGD/colonoscopy:  Due for screening. Discussed referral to Dr Tarri Glenn  No history of GB, pancreatitis or appy.    ROS: See pertinent positives and negatives per HPI.  Past Medical History:  Diagnosis Date  . Allergy   . Anemia   . Anxiety    claustrophobic  . Asthma   . Back pain   . Biceps tendonosis of right shoulder   . Diabetes mellitus 2011   did not start metforfin, losing weight  . Dyspnea   . Fatty liver   . Food allergy   . Headache disorder   . History of concussion   . HLD (hyperlipidemia)   . Hypertension   . IBS (irritable bowel syndrome)   . Infertility, female   . Joint pain   . Lactose intolerance   . Leg edema   . Migraines   . Other specified disorders of thyroid   . Palpitation   . Post-menopausal   . Seborrheic dermatitis   . Shoulder impingement syndrome, right   . Vitamin D deficiency     Past Surgical History:  Procedure Laterality Date  . ABDOMINAL HYSTERECTOMY  2006   heavy menses,  endometriosis, l oophrectomy  . BACK SURGERY    . BREAST BIOPSY Right 2018   benign  . BREAST EXCISIONAL BIOPSY Right 2003   benign  . BREAST EXCISIONAL BIOPSY Right 1999   benign  . BREAST SURGERY     right breast x 2 , benign  . HERNIA REPAIR  2003   left inguinal   . LEFT OOPHORECTOMY    . SHOULDER ARTHROSCOPY WITH SUBACROMIAL DECOMPRESSION AND OPEN ROTATOR C Right 07/14/2016   Procedure: RIGHT SHOULDER ARTHROSCOPY WITH SUBACROMIAL DECOMPRESSION, DISTAL CLAVICLE RESECTION AND MINI OPEN ROTATOR CUFF REPAIR, OPEN BICEP TENDODESIS;  Surgeon: Garald Balding,  MD;  Location: [facility];  Service: Orthopedics;  Laterality: Right;  . SHOULDER CLOSED REDUCTION Right 09/08/2016   Procedure: RIGHT CLOSED MANIPULATION SHOULDER;  Surgeon: Garald Balding, MD;  Location: [facility];  Service: Orthopedics;  Laterality: Right;  . TUBAL LIGATION      Family History  Problem Relation Age of Onset  . Diabetes Mother   . Heart disease Mother 70  . Hypertension Mother   . Hyperlipidemia Mother   . Heart failure Mother   . Stroke Mother   . Thyroid disease Mother   . Depression Mother   . Sleep apnea Mother   . Obesity Mother   . Cancer Father 8       Lung Cancer  . Heart disease Father   . Mental illness Sister        bipolar, substance abuse,  clean 2 yrs  . Hypertension Sister   . Cancer Paternal Grandmother 21       breast cancer  . Breast cancer Paternal Grandmother   . Diabetes Maternal Grandmother   . Hypertension Maternal Grandmother   . Diabetes Son     SOCIAL HX:  reports that she has never smoked. She has never used smokeless tobacco. She reports that she does not drink alcohol and does not use drugs.   Current Outpatient Medications:  .  albuterol (VENTOLIN HFA) 108 (90 Base) MCG/ACT inhaler, Inhale 2 puffs into the lungs every 6 (six) hours as needed for wheezing or shortness of breath., Disp: 18 g, Rfl: 0 .  ALPRAZolam (XANAX) 0.5 MG tablet, Take 1 tablet (0.5 mg total) by mouth 3 (three) times daily as needed (Headache)., Disp: , Rfl:  .  aspirin EC 81 MG tablet, Take 81 mg by mouth daily., Disp: , Rfl:  .  atorvastatin (LIPITOR) 20 MG tablet, TAKE 1 TABLET BY MOUTH DAILY., Disp: 90 tablet, Rfl: 3 .  Blood Glucose Monitoring Suppl (FREESTYLE LITE) DEVI, 1 each by Does not apply route 2 (two) times daily. E11.9, Disp: 1 each, Rfl: 0 .  Bromfenac Sodium (PROLENSA) 0.07 % SOLN, Place 1 drop into both eyes 4 (four) times daily. (Patient taking differently: Place 1 drop into both eyes as needed.), Disp: 6  mL, Rfl: 3 .  butalbital-acetaminophen-caffeine (FIORICET) 50-325-40 MG tablet, Take 1 tablet by mouth every 6 (six) hours as needed for headache. Do not refill in less than 30 day, Disp: 30 tablet, Rfl: 1 .  diazepam (VALIUM) 2 MG tablet, Take 1 tablet (2 mg total) by mouth every 6 (six) hours as needed (Headache)., Disp: 30 tablet, Rfl: 5 .  dicyclomine (BENTYL) 20 MG tablet, Take 1 tablet (20 mg total) by mouth every 6 (six) hours., Disp: 90 tablet, Rfl: 1 .  diltiazem (CARDIZEM LA) 240 MG 24 hr tablet, TAKE 1 TABLET BY MOUTH DAILY, Disp: 90  tablet, Rfl: 1 .  EPINEPHrine 0.3 mg/0.3 mL IJ SOAJ injection, Inject 0.3 mLs (0.3 mg total) into the muscle as needed (for allergic reaction)., Disp: 1 each, Rfl: 3 .  Fremanezumab-vfrm (AJOVY) 225 MG/1.5ML SOAJ, Inject 675 mg into the skin every 3 (three) months., Disp: 3 pen, Rfl: 3 .  glucose blood (FREESTYLE LITE) test strip, 1 each by Other route 2 (two) times daily. E11.9, Disp: 200 each, Rfl: 0 .  insulin degludec (TRESIBA FLEXTOUCH) 100 UNIT/ML FlexTouch Pen, Inject 10 Units into the skin daily., Disp: 6 mL, Rfl: 0 .  insulin lispro (HUMALOG KWIKPEN) 100 UNIT/ML KwikPen, Inject 0.2 mLs (20 Units total) into the skin 2 (two) times daily with a meal., Disp: 67.5 mL, Rfl: 0 .  Insulin Pen Needle (PEN NEEDLES 31GX5/16") 31G X 8 MM MISC, 1 each by Does not apply route in the morning, at noon, in the evening, and at bedtime. E11.9, Disp: 360 each, Rfl: 0 .  Lancets (FREESTYLE) lancets, 1 each by Other route 2 (two) times daily. E11.9, Disp: 200 each, Rfl: 0 .  losartan-hydrochlorothiazide (HYZAAR) 50-12.5 MG tablet, Take 1 tablet by mouth daily., Disp: 90 tablet, Rfl: 3 .  metFORMIN (GLUCOPHAGE-XR) 500 MG 24 hr tablet, Take 2 tablets (1,000 mg total) by mouth daily., Disp: 180 tablet, Rfl: 3 .  metoprolol succinate (TOPROL-XL) 25 MG 24 hr tablet, TAKE 1/2 TABLET BY MOUTH DAILY., Disp: 45 tablet, Rfl: 2 .  ondansetron (ZOFRAN ODT) 4 MG disintegrating tablet,  Take 1 tablet (4 mg total) by mouth every 8 (eight) hours as needed for nausea or vomiting., Disp: 20 tablet, Rfl: 0 .  oxyCODONE-acetaminophen (PERCOCET/ROXICET) 5-325 MG tablet, Take by mouth as needed for severe pain., Disp: , Rfl:  .  Semaglutide, 1 MG/DOSE, (OZEMPIC, 1 MG/DOSE,) 2 MG/1.5ML SOPN, Inject 0.75 mLs (1 mg total) into the skin once a week., Disp: 3 pen, Rfl: 3  EXAM:  General impression: alert, cooperative and articulate.  No signs of being in distress  Lungs: speech is fluent sentence length suggests that patient is not short of breath and not punctuated by cough, sneezing or sniffing. [name]   Psych: affect normal.  speech is articulate and non pressured .  Denies suicidal thoughts    ASSESSMENT AND PLAN:  Discussed the following assessment and plan:  Epigastric pain - Plan: Comprehensive metabolic panel, Lipase, Ambulatory referral to Gastroenterology, CANCELED: Lipase, CANCELED: Comprehensive metabolic panel  Uncontrolled type 2 diabetes mellitus with hyperglycemia (HCC)  Obesity (BMI 30-39.9)  Uveitis, intermediate, bilateral  Diarrhea, functional - Plan: Ambulatory referral to Gastroenterology  Uncontrolled type 2 diabetes mellitus ([place]) Now on ozempic. Last a1c 6.0  Managed by sean ellison   Obesity (BMI 30-39.9) I have congratulated her in reduction of   BMI and encouraged  Continued weight loss with goal of 10% of body weigh over the next 6 months using a low glycemic index diet and regular exercise a minimum of 5 days per week.    Uveitis, intermediate, bilateral Resolving after nearly a year of pain and inflammation   Diarrhea, functional Symptoms are suggestive of IBS  (prior diagnosis) and were relieved with antispasmodic.  Given her use of ozempic,  Need to check liver enzymes and lipase. Dicyclomine refilled. If dicyclomine continues to work and labs are normal, will defer U/S for now     I discussed the assessment and treatment plan with the patient.  The patient was provided an opportunity to ask questions and all were answered. The  patient agreed with the plan and demonstrated an understanding of the instructions.   The patient was advised to call back or seek an in-person evaluation if the symptoms worsen or if the condition fails to improve as anticipated.  I provided  30 minutes of non-face-to-face time during this encounter.   Crecencio Mc, MD

## 2020-06-22 NOTE — Progress Notes (Signed)
History of IBS.

## 2020-06-22 NOTE — Assessment & Plan Note (Signed)
Now on ozempic. Last a1c 6.0  Managed by sean ellison

## 2020-06-22 NOTE — Assessment & Plan Note (Signed)
Described as cramping,  Accompanying diarrhea.  Rule out pancreatitis and GB  Before attributing to IBS givne use of Ozempic .  Will refer for colonoscopy/EGD as she is due for screening

## 2020-06-23 MED FILL — DICYCLOMINE 20 MG TABLET: 20 | 22 days supply | Qty: 90 | Fill #0

## 2020-06-24 ENCOUNTER — Telehealth: Payer: Self-pay | Admitting: Endocrinology

## 2020-06-24 ENCOUNTER — Other Ambulatory Visit: Payer: Self-pay | Admitting: *Deleted

## 2020-06-24 DIAGNOSIS — E1165 Type 2 diabetes mellitus with hyperglycemia: Secondary | ICD-10-CM

## 2020-06-24 MED ORDER — INSULIN LISPRO (1 UNIT DIAL) 100 UNIT/ML (KWIKPEN): 20.0000 [IU] | PEN_INJECTOR | Freq: Two times a day (BID) | SUBCUTANEOUS | 0 refills | Status: DC

## 2020-06-24 NOTE — Telephone Encounter (Signed)
Manuela Schwartz with [facility] called re: [name] has faxed refill requests for Humalog Kwikpen to our office on 06/19/20 & 06/22/20 with no response. PHARM states patient is out of Humalog and requests a new RX for a 90 day supply of Humalog Kwikpen be sent asap to  [place], [place] - [address] [facility]. Phone:  [phone]  Fax:  [phone]

## 2020-06-24 NOTE — Telephone Encounter (Signed)
Refilled Humalog kwikpen--[facility]

## 2020-06-29 NOTE — Telephone Encounter (Signed)
Please call in Humalog to [facility] outpatient pharmacy - Rx never went through the other day.

## 2020-06-30 ENCOUNTER — Other Ambulatory Visit (HOSPITAL_COMMUNITY): Payer: Self-pay | Admitting: Endocrinology

## 2020-06-30 ENCOUNTER — Other Ambulatory Visit: Payer: Self-pay | Admitting: *Deleted

## 2020-06-30 DIAGNOSIS — E1165 Type 2 diabetes mellitus with hyperglycemia: Secondary | ICD-10-CM

## 2020-06-30 MED ORDER — INSULIN LISPRO (1 UNIT DIAL) 100 UNIT/ML (KWIKPEN): 20.0000 [IU] | PEN_INJECTOR | Freq: Two times a day (BID) | SUBCUTANEOUS | 0 refills | Status: DC

## 2020-06-30 MED FILL — HUMALOG 100 UNITS/ML KWIKPE: 100 | 90 days supply | Qty: 36 | Fill #0

## 2020-06-30 NOTE — Telephone Encounter (Signed)
Rx was resent to her pharmacy, it did not print or e-scribe on the 19th.

## 2020-07-17 ENCOUNTER — Encounter: Payer: Self-pay | Admitting: Physician Assistant

## 2020-07-17 ENCOUNTER — Other Ambulatory Visit: Payer: Self-pay

## 2020-07-17 ENCOUNTER — Ambulatory Visit: Payer: 59 | Admitting: Physician Assistant

## 2020-07-17 ENCOUNTER — Other Ambulatory Visit (HOSPITAL_COMMUNITY): Payer: Self-pay | Admitting: Physician Assistant

## 2020-07-17 DIAGNOSIS — G43709 Chronic migraine without aura, not intractable, without status migrainosus: Secondary | ICD-10-CM | POA: Diagnosis not present

## 2020-07-17 DIAGNOSIS — R1013 Epigastric pain: Secondary | ICD-10-CM | POA: Diagnosis not present

## 2020-07-17 MED ORDER — AJOVY 225 MG/1.5ML ~~LOC~~ SOSY: 4.5000 mL | PREFILLED_SYRINGE | SUBCUTANEOUS | 4 refills | Status: DC

## 2020-07-17 NOTE — Progress Notes (Signed)
Refill on ajovy  History:  [name] is a 50 y.o. G3P2010 who presents to clinic today for followup of headaches.  She is seen in person for the first time in a long while.  She had covid in December 2020 and has been dealing with symptoms since, including uveitis.  She notes her Ajovy taken every 3 months gave the most relief she has experienced in years.  However she has experienced the autoinjector coming apart and typically can only use 2 out of 3 at a time.  Furthermore, her prescription ran out and she could not get any more.  Her headaches are overall similar in quality but much more frequent. 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  She is near the end of school with only exams and dissertation to go.   Of note, she experienced anaphylaxis with the monoclonal antibody treatment for COVID.  She continues to be severely sensitive to many drugs.      HIT6:66 Number of days in the last 4 weeks with:  Severe headache: 4 Moderate headache: 20 Mild headache: 3  No headache: 1   Past Medical History:  Diagnosis Date  . Allergy   . Anemia   . Anxiety    claustrophobic  . Asthma   . Back pain   . Biceps tendonosis of right shoulder   . Diabetes mellitus 2011   did not start metforfin, losing weight  . Dyspnea   . Fatty liver   . Food allergy   . Headache disorder   . History of concussion   . HLD (hyperlipidemia)   . Hypertension   . IBS (irritable bowel syndrome)   . Infertility, female   . Joint pain   . Lactose intolerance   . Leg edema   . Migraines   . Other specified disorders of thyroid   . Palpitation   . Post-menopausal   . Seborrheic dermatitis   . Shoulder impingement syndrome, right   . Vitamin D deficiency     Social History   Socioeconomic History  . Marital status: Divorced    Spouse name: Not on file  . Number of children: 2  . Years of education: 76  . Highest education level: Master's degree (e.g., MA, MS,  MEng, MEd, MSW, MBA)  Occupational History  . 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 Smoking status: Never Smoker  . Smokeless tobacco: Never Used  Vaping Use  . Vaping Use: Never used  Substance and Sexual Activity  . Alcohol use: No  . Drug use: No  . Sexual activity: Not Currently    Partners: Male    Birth control/protection: Surgical  Other Topics Concern  . Not on file  Social History Narrative   Lives at home with son and daughter.   Right-handed.   1-3 cups caffeine weekly.   Social Determinants of Health   Financial Resource Strain: Not on file  Food Insecurity: Not on file  Transportation Needs: Not on file  Physical Activity: Not on file  Stress: Not on file  Social Connections: Not on file  Intimate Partner Violence: Not on file    Family History  Problem Relation Age of Onset  . Diabetes Mother   . Heart disease Mother 47  . Hypertension Mother   . Hyperlipidemia Mother   . Heart failure Mother   . Stroke Mother   . Thyroid disease  Mother   . Depression Mother   . Sleep apnea Mother   . Obesity Mother   . Cancer Father 42       Lung Cancer  . Heart disease Father   . Mental illness Sister        bipolar, substance abuse,  clean 2 yrs  . Hypertension Sister   . Cancer Paternal Grandmother 5       breast cancer  . Breast cancer Paternal Grandmother   . Diabetes Maternal Grandmother   . Hypertension Maternal Grandmother   . Diabetes Son     Allergies  Allergen Reactions  . Bamlanivimab Anaphylaxis, Itching, Palpitations, Rash, Shortness Of Breath and Swelling  . Botox [Onabotulinumtoxina] Shortness Of Breath    syncope  . Clindamycin/Lincomycin Other (See Comments)  . Kiwi Extract Shortness Of Breath  . Maxalt [Rizatriptan Benzoate] Anaphylaxis    Chest pain  . Nitrous Oxide Shortness Of Breath    syncope  . Triptans Shortness Of Breath  . Aspirin Nausea And Vomiting    Mouth blisters  . Contrast Media  [Iodinated Diagnostic Agents]   . Flagyl [Metronidazole]   . Hydrocodone-Acetaminophen Other (See Comments)  . Latex     Sometimes causes rash  . Mango Flavor   . Rizatriptan Other (See Comments)  . Vicodin [Hydrocodone-Acetaminophen]     hallucinations  . Influenza Virus Vaccine Palpitations  . Penicillins Nausea And Vomiting, Rash and Other (See Comments)    Did it involve swelling of the face/tongue/throat, SOB, or low BP? No Did it involve sudden or severe rash/hives, skin peeling, or any reaction on the inside of your mouth or nose? No Did you need to seek medical attention at a hospital or doctor's office? Yes When did it last happen?patient was 50 years old If all above answers are "NO", may proceed with cephalosporin use.  . Tetracyclines & Related Nausea And Vomiting and Rash    Current Outpatient Medications on File Prior to Visit  Medication Sig Dispense Refill  . albuterol (VENTOLIN HFA) 108 (90 Base) MCG/ACT inhaler Inhale 2 puffs into the lungs every 6 (six) hours as needed for wheezing or shortness of breath. 18 g 0  . ALPRAZolam (XANAX) 0.5 MG tablet Take 1 tablet (0.5 mg total) by mouth 3 (three) times daily as needed (Headache).    [name] aspirin EC 81 MG tablet Take 81 mg by mouth daily.    [name] atorvastatin (LIPITOR) 20 MG tablet TAKE 1 TABLET BY MOUTH DAILY. 90 tablet 3  . butalbital-acetaminophen-caffeine (FIORICET) 50-325-40 MG tablet Take 1 tablet by mouth every 6 (six) hours as needed for headache. Do not refill in less than 30 day 30 tablet 1  . diazepam (VALIUM) 2 MG tablet Take 1 tablet (2 mg total) by mouth every 6 (six) hours as needed (Headache). 30 tablet 5  . dicyclomine (BENTYL) 20 MG tablet Take 1 tablet (20 mg total) by mouth every 6 (six) hours. 90 tablet 1  . diltiazem (CARDIZEM LA) 240 MG 24 hr tablet TAKE 1 TABLET BY MOUTH DAILY 90 tablet 1  . Fremanezumab-vfrm (AJOVY) 225 MG/1.5ML SOAJ Inject 675 mg into the skin every 3 (three) months. 3 pen 3  .  insulin lispro (HUMALOG KWIKPEN) 100 UNIT/ML KwikPen Inject 20 Units into the skin 2 (two) times daily with a meal. 67.5 mL 0  . losartan-hydrochlorothiazide (HYZAAR) 50-12.5 MG tablet Take 1 tablet by mouth daily. 90 tablet 3  . metFORMIN (GLUCOPHAGE-XR) 500 MG 24 hr tablet Take 2  tablets (1,000 mg total) by mouth daily. 180 tablet 3  . metoprolol succinate (TOPROL-XL) 25 MG 24 hr tablet TAKE 1/2 TABLET BY MOUTH DAILY. 45 tablet 2  . ondansetron (ZOFRAN ODT) 4 MG disintegrating tablet Take 1 tablet (4 mg total) by mouth every 8 (eight) hours as needed for nausea or vomiting. 20 tablet 0  . oxyCODONE-acetaminophen (PERCOCET/ROXICET) 5-325 MG tablet Take by mouth as needed for severe pain.    . Semaglutide, 1 MG/DOSE, (OZEMPIC, 1 MG/DOSE,) 2 MG/1.5ML SOPN Inject 0.75 mLs (1 mg total) into the skin once a week. 3 pen 3  . Blood Glucose Monitoring Suppl (FREESTYLE LITE) DEVI 1 each by Does not apply route 2 (two) times daily. E11.9 1 each 0  . Bromfenac Sodium (PROLENSA) 0.07 % SOLN Place 1 drop into both eyes 4 (four) times daily. (Patient taking differently: Place 1 drop into both eyes as needed.) 6 mL 3  . EPINEPHrine 0.3 mg/0.3 mL IJ SOAJ injection Inject 0.3 mLs (0.3 mg total) into the muscle as needed (for allergic reaction). 1 each 3  . glucose blood (FREESTYLE LITE) test strip 1 each by Other route 2 (two) times daily. E11.9 200 each 0  . insulin degludec (TRESIBA FLEXTOUCH) 100 UNIT/ML FlexTouch Pen Inject 10 Units into the skin daily. 6 mL 0  . Insulin Pen Needle (PEN NEEDLES 31GX5/16") 31G X 8 MM MISC 1 each by Does not apply route in the morning, at noon, in the evening, and at bedtime. E11.9 360 each 0  . Lancets (FREESTYLE) lancets 1 each by Other route 2 (two) times daily. E11.9 200 each 0   No current facility-administered medications on file prior to visit.     Review of Systems:  All pertinent positive/negative included in HPI, all other review of systems are negative    Objective:  Physical Exam There were no vitals taken for this visit. CONSTITUTIONAL: Well-developed, well-nourished female in no acute distress.  EYES: EOM intact ENT: Normocephalic RESPIRATORY: Normal rate.  MUSCULOSKELETAL: Normal ROM SKIN: Warm, dry without erythema  NEUROLOGICAL: Alert, oriented, CN II-XII grossly intact, Appropriate balance PSYCH: Normal behavior, mood   Assessment & Plan:  Assessment: 1. Chronic migraine w/o aura w/o status migrainosus, not intractable   2. Migraine without aura and without status migrainosus, not intractable    Worsening of above problem  Plan: Restart Ajovy for prevention.  Rx for syringe rather than auto-injector in hopes of better ability to utilize without equipment malfunction.  Healthy habits encouraged.   Follow-up in 12 months or sooner PRN  Paticia Stack, PA-C 07/17/2020 8:25 AM

## 2020-07-17 NOTE — Patient Instructions (Signed)

## 2020-07-18 LAB — COMPREHENSIVE METABOLIC PANEL
ALT: 20 IU/L (ref 0–32)
AST: 16 IU/L (ref 0–40)
Albumin/Globulin Ratio: 2.2 (ref 1.2–2.2)
Albumin: 4.4 g/dL (ref 3.8–4.8)
Alkaline Phosphatase: 139 IU/L — ABNORMAL HIGH (ref 44–121)
BUN/Creatinine Ratio: 11 (ref 9–23)
BUN: 8 mg/dL (ref 6–24)
Bilirubin Total: 0.2 mg/dL (ref 0.0–1.2)
CO2: 21 mmol/L (ref 20–29)
Calcium: 9.4 mg/dL (ref 8.7–10.2)
Chloride: 104 mmol/L (ref 96–106)
Creatinine, Ser: 0.74 mg/dL (ref 0.57–1.00)
GFR calc Af Amer: 109 mL/min/{1.73_m2} (ref >59)
GFR calc non Af Amer: 95 mL/min/{1.73_m2} (ref >59)
Globulin, Total: 2 g/dL (ref 1.5–4.5)
Glucose: 108 mg/dL — ABNORMAL HIGH (ref 65–99)
Potassium: 3.9 mmol/L (ref 3.5–5.2)
Sodium: 140 mmol/L (ref 134–144)
Total Protein: 6.4 g/dL (ref 6.0–8.5)

## 2020-07-18 LAB — LIPASE: Lipase: 29 U/L (ref 14–72)

## 2020-07-19 NOTE — Addendum Note (Signed)
Addended by: Crecencio Mc on: 07/19/2020 08:04 PM   Modules accepted: Orders

## 2020-07-20 ENCOUNTER — Other Ambulatory Visit: Payer: Self-pay

## 2020-07-20 ENCOUNTER — Emergency Department: Payer: 59

## 2020-07-20 ENCOUNTER — Encounter: Payer: Self-pay | Admitting: Intensive Care

## 2020-07-20 ENCOUNTER — Emergency Department
Admission: EM | Admit: 2020-07-20 | Discharge: 2020-07-20 | Disposition: A | Discharge status: Home / Self Care | Payer: 59 | Attending: Emergency Medicine | Admitting: Emergency Medicine

## 2020-07-20 DIAGNOSIS — Z90710 Acquired absence of both cervix and uterus: Secondary | ICD-10-CM | POA: Diagnosis not present

## 2020-07-20 DIAGNOSIS — E1169 Type 2 diabetes mellitus with other specified complication: Secondary | ICD-10-CM | POA: Diagnosis not present

## 2020-07-20 DIAGNOSIS — Z7984 Long term (current) use of oral hypoglycemic drugs: Secondary | ICD-10-CM | POA: Diagnosis not present

## 2020-07-20 DIAGNOSIS — Z794 Long term (current) use of insulin: Secondary | ICD-10-CM | POA: Insufficient documentation

## 2020-07-20 DIAGNOSIS — R110 Nausea: Secondary | ICD-10-CM | POA: Insufficient documentation

## 2020-07-20 DIAGNOSIS — Z79899 Other long term (current) drug therapy: Secondary | ICD-10-CM | POA: Diagnosis not present

## 2020-07-20 DIAGNOSIS — I10 Essential (primary) hypertension: Secondary | ICD-10-CM | POA: Insufficient documentation

## 2020-07-20 DIAGNOSIS — Z91040 Latex allergy status: Secondary | ICD-10-CM | POA: Insufficient documentation

## 2020-07-20 DIAGNOSIS — Z7982 Long term (current) use of aspirin: Secondary | ICD-10-CM | POA: Insufficient documentation

## 2020-07-20 DIAGNOSIS — J45909 Unspecified asthma, uncomplicated: Secondary | ICD-10-CM | POA: Diagnosis not present

## 2020-07-20 DIAGNOSIS — R109 Unspecified abdominal pain: Secondary | ICD-10-CM | POA: Diagnosis present

## 2020-07-20 DIAGNOSIS — E785 Hyperlipidemia, unspecified: Secondary | ICD-10-CM | POA: Insufficient documentation

## 2020-07-20 DIAGNOSIS — N200 Calculus of kidney: Secondary | ICD-10-CM | POA: Diagnosis not present

## 2020-07-20 DIAGNOSIS — R1084 Generalized abdominal pain: Secondary | ICD-10-CM | POA: Insufficient documentation

## 2020-07-20 DIAGNOSIS — R197 Diarrhea, unspecified: Secondary | ICD-10-CM | POA: Diagnosis not present

## 2020-07-20 LAB — CBC
HCT: 38 % (ref 36.0–46.0)
Hemoglobin: 12.5 g/dL (ref 12.0–15.0)
MCH: 29.1 pg (ref 26.0–34.0)
MCHC: 32.9 g/dL (ref 30.0–36.0)
MCV: 88.4 fL (ref 80.0–100.0)
Platelets: 210 10*3/uL (ref 150–400)
RBC: 4.3 MIL/uL (ref 3.87–5.11)
RDW: 12.5 % (ref 11.5–15.5)
WBC: 7.6 10*3/uL (ref 4.0–10.5)
nRBC: 0 % (ref 0.0–0.2)

## 2020-07-20 LAB — URINALYSIS, COMPLETE (UACMP) WITH MICROSCOPIC
Bilirubin Urine: NEGATIVE
Glucose, UA: NEGATIVE mg/dL
Hgb urine dipstick: NEGATIVE
Ketones, ur: NEGATIVE mg/dL
Leukocytes,Ua: NEGATIVE
Nitrite: NEGATIVE
Protein, ur: NEGATIVE mg/dL
Specific Gravity, Urine: 1.021 (ref 1.005–1.030)
pH: 5 (ref 5.0–8.0)

## 2020-07-20 LAB — COMPREHENSIVE METABOLIC PANEL
ALT: 24 U/L (ref 0–44)
AST: 20 U/L (ref 15–41)
Albumin: 4.3 g/dL (ref 3.5–5.0)
Alkaline Phosphatase: 116 U/L (ref 38–126)
Anion gap: 8 (ref 5–15)
BUN: 9 mg/dL (ref 6–20)
CO2: 24 mmol/L (ref 22–32)
Calcium: 9.3 mg/dL (ref 8.9–10.3)
Chloride: 108 mmol/L (ref 98–111)
Creatinine, Ser: 0.77 mg/dL (ref 0.44–1.00)
GFR, Estimated: >60 mL/min (ref >60)
Glucose, Bld: 88 mg/dL (ref 70–99)
Potassium: 3.5 mmol/L (ref 3.5–5.1)
Sodium: 140 mmol/L (ref 135–145)
Total Bilirubin: 0.5 mg/dL (ref 0.3–1.2)
Total Protein: 7.4 g/dL (ref 6.5–8.1)

## 2020-07-20 LAB — LIPASE, BLOOD: Lipase: 35 U/L (ref 11–51)

## 2020-07-20 MED ORDER — DROPERIDOL 2.5 MG/ML IJ SOLN
2.5000 mg | Freq: Once | INTRAMUSCULAR | Status: AC
  Administered 2020-07-20: 2.5 mg via INTRAMUSCULAR
  Filled 2020-07-20: qty 2

## 2020-07-20 MED ORDER — ONDANSETRON 4 MG PO TBDP
4.0000 mg | ORAL_TABLET | Freq: Once | ORAL | Status: AC | PRN
  Administered 2020-07-20: 4 mg via ORAL
  Filled 2020-07-20: qty 1

## 2020-07-20 MED ORDER — DICYCLOMINE HCL 10 MG PO CAPS
10.0000 mg | ORAL_CAPSULE | Freq: Once | ORAL | Status: AC
  Administered 2020-07-20: 10 mg via ORAL
  Filled 2020-07-20: qty 1

## 2020-07-20 MED ORDER — OXYCODONE-ACETAMINOPHEN 5-325 MG PO TABS
1.0000 | ORAL_TABLET | ORAL | Status: DC | PRN
  Administered 2020-07-20: 1 via ORAL
  Filled 2020-07-20: qty 1

## 2020-07-20 NOTE — Discharge Instructions (Signed)
As we discussed, please continue to take your Bentyl medication 3-4 times per day to assist with your abdominal discomfort and stool changes.  If you develop any acutely worsening symptoms despite these medications, please return to the ED.

## 2020-07-20 NOTE — Telephone Encounter (Signed)
Patient called in wanted to let Dr.Tullo know that she decide to go the ED because she is in a lot of pain

## 2020-07-20 NOTE — ED Provider Notes (Signed)
[facility] Emergency Department Provider Note ____________________________________________   Event Date/Time   First MD Initiated Contact with Patient 07/20/20 1550     (approximate)  I have reviewed the triage vital signs and the nursing notes.  HISTORY  Chief Complaint Abdominal Pain   HPI [name] is a 50 y.o. femalewho presents to the ED for evaluation of abdominal pain.  Chart review indicates obesity, HTN, HLD, DM on insulin. Hx IBS On Bentyl.  Patient presents to the ED with acute on chronic abdominal discomfort with associated stool changes.  She reports frequent small-volume and loose stools, 5-10 episodes per day, that are pale in color and "appear like oatmeal."  She reports associated nausea without emesis.  She reports occasionally having postprandial pain, but nothing specific to her RUQ.  Reports voiding at her baseline without dysuria, hematuria.  Denies vaginal discharge or bleeding.  Denies fever, cough, emesis, chest pain, shortness of breath.  Past Medical History:  Diagnosis Date  . Allergy   . Anemia   . Anxiety    claustrophobic  . Asthma   . Back pain   . Biceps tendonosis of right shoulder   . Diabetes mellitus 2011   did not start metforfin, losing weight  . Dyspnea   . Fatty liver   . Food allergy   . Headache disorder   . History of concussion   . HLD (hyperlipidemia)   . Hypertension   . IBS (irritable bowel syndrome)   . Infertility, female   . Joint pain   . Lactose intolerance   . Leg edema   . Migraines   . Palpitation   . Post-menopausal   . Seborrheic dermatitis   . Shoulder impingement syndrome, right   . Vitamin D deficiency     Patient Active Problem List   Diagnosis Date Noted  . Diarrhea, functional 06/22/2020  . Epigastric pain 06/22/2020  . Uveitis, intermediate, bilateral 09/07/2019  . Generalized edema 06/13/2019  . Abnormal electrocardiogram (ECG) (EKG) 06/07/2019  . Hospital  discharge follow-up 06/07/2019  . Postviral fatigue syndrome 05/30/2019  . Other fatigue 01/23/2018  . Shortness of breath 01/23/2018  . Multinodular goiter 12/05/2017  . Multiple thyroid nodules 12/05/2017  . Microalbuminuria due to type 2 diabetes mellitus ([place]) 11/26/2017  . Encounter for preventive health examination 11/26/2017  . Menopause syndrome 06/10/2017  . Adverse reaction to vaccine, sequela 03/25/2017  . Palpitations 02/27/2017  . PAC (premature atrial contraction) 02/27/2017  . PVC's (premature ventricular contractions) 02/27/2017  . PSVT (paroxysmal supraventricular tachycardia) ([place]) 11/26/2016  . Nontraumatic incomplete tear of right rotator cuff 07/14/2016  . AC (acromioclavicular) arthritis 07/14/2016  . Impingement syndrome of right shoulder 07/14/2016  . Fibrocystic breast changes, right 05/21/2016  . Menopausal symptoms 10/30/2015  . Insomnia 10/16/2015  . Snoring 07/26/2015  . Chronic migraine w/o aura w/o status migrainosus, not intractable 02/24/2015  . Muscle spasm 02/24/2015  . History of concussion 07/21/2014  . Hyperlipidemia associated with type 2 diabetes mellitus ([place]) 06/13/2014  . Vitamin D deficiency 05/07/2013  . Chronic migraine 11/20/2012  . S/P Total Abdominal Hysterectomy and Left Salpingo-oophorectomy 10/20/2011  . Headache around the eyes   . Uncontrolled type 2 diabetes mellitus ([place]) 07/20/2011  . Obesity (BMI 30-39.9) 07/20/2011  . Essential hypertension 07/20/2011    Past Surgical History:  Procedure Laterality Date  . ABDOMINAL HYSTERECTOMY  2006   heavy menses, endometriosis, l oophrectomy  . BACK SURGERY    . BREAST BIOPSY Right  2018   benign  . BREAST EXCISIONAL BIOPSY Right 2003   benign  . BREAST EXCISIONAL BIOPSY Right 1999   benign  . BREAST SURGERY     right breast x 2 , benign  . HERNIA REPAIR  2003   left inguinal   . LEFT OOPHORECTOMY    . SHOULDER ARTHROSCOPY WITH SUBACROMIAL DECOMPRESSION AND OPEN ROTATOR C  Right 07/14/2016   Procedure: RIGHT SHOULDER ARTHROSCOPY WITH SUBACROMIAL DECOMPRESSION, DISTAL CLAVICLE RESECTION AND MINI OPEN ROTATOR CUFF REPAIR, OPEN BICEP TENDODESIS;  Surgeon: Garald Balding, MD;  Location: [facility];  Service: Orthopedics;  Laterality: Right;  . SHOULDER CLOSED REDUCTION Right 09/08/2016   Procedure: RIGHT CLOSED MANIPULATION SHOULDER;  Surgeon: Garald Balding, MD;  Location: [place];  Service: Orthopedics;  Laterality: Right;  . TUBAL LIGATION      Prior to Admission medications   Medication Sig Start Date End Date Taking? Authorizing Provider  albuterol (VENTOLIN HFA) 108 (90 Base) MCG/ACT inhaler Inhale 2 puffs into the lungs every 6 (six) hours as needed for wheezing or shortness of breath. 05/23/19   Wurst, [place], PA-C  ALPRAZolam (XANAX) 0.5 MG tablet Take 1 tablet (0.5 mg total) by mouth 3 (three) times daily as needed (Headache). 06/02/19   Enzo Bi, MD  aspirin EC 81 MG tablet Take 81 mg by mouth daily.    [provider]  atorvastatin (LIPITOR) 20 MG tablet TAKE 1 TABLET BY MOUTH DAILY. 04/22/20   Crecencio Mc, MD  Blood Glucose Monitoring Suppl (FREESTYLE LITE) DEVI 1 each by Does not apply route 2 (two) times daily. E11.9 07/12/19   Renato Shin, MD  Bromfenac Sodium (PROLENSA) 0.07 % SOLN Place 1 drop into both eyes 4 (four) times daily. Patient taking differently: Place 1 drop into both eyes as needed. 01/22/20   Bernarda Caffey, MD  butalbital-acetaminophen-caffeine (FIORICET) [phone] MG tablet Take 1 tablet by mouth every 6 (six) hours as needed for headache. Do not refill in less than 30 day 06/05/19   Crecencio Mc, MD  diazepam (VALIUM) 2 MG tablet Take 1 tablet (2 mg total) by mouth every 6 (six) hours as needed (Headache). 08/07/19   Crecencio Mc, MD  dicyclomine (BENTYL) 20 MG tablet Take 1 tablet (20 mg total) by mouth every 6 (six) hours. 06/22/20   Crecencio Mc, MD  diltiazem (CARDIZEM LA) 240  MG 24 hr tablet TAKE 1 TABLET BY MOUTH DAILY 12/04/19   Crecencio Mc, MD  EPINEPHrine 0.3 mg/0.3 mL IJ SOAJ injection Inject 0.3 mLs (0.3 mg total) into the muscle as needed (for allergic reaction). 05/24/19   Carmin Muskrat, MD  Fremanezumab-vfrm (AJOVY) 225 MG/1.5ML SOSY Inject 4.5 mLs into the skin every 3 (three) months. Dispense 3 syringes 07/17/20   Jaclyn Prime, Collene Leyden, PA-C  glucose blood (FREESTYLE LITE) test strip 1 each by Other route 2 (two) times daily. E11.9 07/12/19   Renato Shin, MD  insulin degludec (TRESIBA FLEXTOUCH) 100 UNIT/ML FlexTouch Pen Inject 10 Units into the skin daily. 04/27/20   Renato Shin, MD  insulin lispro (HUMALOG KWIKPEN) 100 UNIT/ML KwikPen Inject 20 Units into the skin 2 (two) times daily with a meal. 06/30/20   Renato Shin, MD  Insulin Pen Needle (PEN NEEDLES 31GX5/16") 31G X 8 MM MISC 1 each by Does not apply route in the morning, at noon, in the evening, and at bedtime. E11.9 08/06/19   Renato Shin, MD  Lancets (FREESTYLE) lancets 1  each by Other route 2 (two) times daily. E11.9 07/12/19   Renato Shin, MD  losartan-hydrochlorothiazide [facility]) 50-12.5 MG tablet Take 1 tablet by mouth daily. 11/01/19   Crecencio Mc, MD  metFORMIN (GLUCOPHAGE-XR) 500 MG 24 hr tablet Take 2 tablets (1,000 mg total) by mouth daily. 04/27/20   Renato Shin, MD  metoprolol succinate (TOPROL-XL) 25 MG 24 hr tablet TAKE 1/2 TABLET BY MOUTH DAILY. 01/08/20   End, Harrell Gave, MD  ondansetron (ZOFRAN ODT) 4 MG disintegrating tablet Take 1 tablet (4 mg total) by mouth every 8 (eight) hours as needed for nausea or vomiting. 09/13/19   Crecencio Mc, MD  oxyCODONE-acetaminophen (PERCOCET/ROXICET) 5-325 MG tablet Take by mouth as needed for severe pain.    [provider]  Semaglutide, 1 MG/DOSE, (OZEMPIC, 1 MG/DOSE,) 2 MG/1.5ML SOPN Inject 0.75 mLs (1 mg total) into the skin once a week. 12/02/19   Renato Shin, MD    Allergies Bamlanivimab, Botox [onabotulinumtoxina],  Clindamycin/lincomycin, Kiwi extract, Maxalt [rizatriptan benzoate], Nitrous oxide, Triptans, Aspirin, Contrast media [iodinated diagnostic agents], Flagyl [metronidazole], Hydrocodone-acetaminophen, Latex, Mango flavor, Rizatriptan, Vicodin [hydrocodone-acetaminophen], Influenza virus vaccine, Penicillins, and Tetracyclines & related  Family History  Problem Relation Age of Onset  . Diabetes Mother   . Heart disease Mother 105  . Hypertension Mother   . Hyperlipidemia Mother   . Heart failure Mother   . Stroke Mother   . Thyroid disease Mother   . Depression Mother   . Sleep apnea Mother   . Obesity Mother   . Cancer Father 44       Lung Cancer  . Heart disease Father   . Mental illness Sister        bipolar, substance abuse,  clean 2 yrs  . Hypertension Sister   . Cancer Paternal Grandmother 14       breast cancer  . Breast cancer Paternal Grandmother   . Diabetes Maternal Grandmother   . Hypertension Maternal Grandmother   . Diabetes Son     Social History Social History   Tobacco Use  . Smoking status: Never Smoker  . Smokeless tobacco: Never Used  Vaping Use  . Vaping Use: Never used  Substance Use Topics  . Alcohol use: No  . Drug use: No   Review of Systems  Constitutional: No fever/chills Eyes: No visual changes. ENT: No sore throat. Cardiovascular: Denies chest pain. Respiratory: Denies shortness of breath. Gastrointestinal:  no vomiting.  No constipation. Positive for abdominal pain, nausea and loose stools. Genitourinary: Negative for dysuria. Musculoskeletal: Negative for back pain. Skin: Negative for rash. Neurological: Negative for headaches, focal weakness or numbness. ____________________________________________   PHYSICAL EXAM:  VITAL SIGNS: Vitals:   07/20/20 1435  BP: (!) 125/91  Pulse: 90  Resp: 16  Temp: 98.1 F (36.7 C)  SpO2: 98%    Constitutional: Alert and oriented. Well appearing and in no acute distress. Eyes: Conjunctivae  are normal. PERRL. EOMI. Head: Atraumatic. Nose: No congestion/rhinnorhea. Mouth/Throat: Mucous membranes are moist.  Oropharynx non-erythematous. Neck: No stridor. No cervical spine tenderness to palpation. Cardiovascular: Normal rate, regular rhythm. Grossly normal heart sounds.  Good peripheral circulation. Respiratory: Normal respiratory effort.  No retractions. Lungs CTAB. Gastrointestinal: Soft , nondistended. No CVA tenderness.. Diffuse and mild abdominal tenderness without localizing or peritoneal features. Musculoskeletal: No lower extremity tenderness nor edema.  No joint effusions. No signs of acute trauma. Neurologic:  Normal speech and language. No gross focal neurologic deficits are appreciated. No gait instability noted. Skin:  Skin is warm, dry and intact. No rash noted. Psychiatric: Mood and affect are normal. Speech and behavior are normal. ____________________________________________   LABS (all labs ordered are listed, but only abnormal results are displayed)  Labs Reviewed  URINALYSIS, COMPLETE (UACMP) WITH MICROSCOPIC - Abnormal; Notable for the following components:      Result Value   Color, Urine YELLOW (*)    APPearance HAZY (*)    Bacteria, UA RARE (*)    All other components within normal limits  LIPASE, BLOOD  COMPREHENSIVE METABOLIC PANEL  CBC   ____________________________________________  RADIOLOGY  ED MD interpretation: CT abdomen/pelvis reviewed by me without evidence of acute intra-abdominal pathology.  Official radiology report(s): CT ABDOMEN PELVIS WO CONTRAST  Result Date: 07/20/2020 CLINICAL DATA:  Abdomen pain with diarrhea EXAM: CT ABDOMEN AND PELVIS WITHOUT CONTRAST TECHNIQUE: Multidetector CT imaging of the abdomen and pelvis was performed following the standard protocol without IV contrast. COMPARISON:  CT 06/20/2017 FINDINGS: Lower chest: Lung bases demonstrate no acute consolidation or effusion 5 mm partially calcified nodule  posterior right lung base. Hepatobiliary: No focal liver abnormality is seen. No gallstones, gallbladder wall thickening, or biliary dilatation. Pancreas: Unremarkable. No pancreatic ductal dilatation or surrounding inflammatory changes. Spleen: Normal in size without focal abnormality. Adrenals/Urinary Tract: Adrenal glands are normal. Hypodense left renal lesions incompletely characterized without contrast. No hydronephrosis. Intrarenal stones bilaterally, this measures up to 3 mm lower pole right kidney and 14 mm lower pole left kidney. The bladder is unremarkable. Stomach/Bowel: Stomach is within normal limits. Appendix appears normal. No evidence of bowel wall thickening, distention, or inflammatory changes. Vascular/Lymphatic: No significant vascular findings are present. No enlarged abdominal or pelvic lymph nodes. Reproductive: Status post hysterectomy. No adnexal masses. Other: Negative for free air or free fluid Musculoskeletal: No acute or significant osseous findings. IMPRESSION: 1. Negative for acute intra-abdominal or pelvic abnormality. 2. Nonobstructing intrarenal stones bilaterally. Electronically Signed   By: Donavan Foil M.D.   On: 07/20/2020 16:31    ____________________________________________   PROCEDURES and INTERVENTIONS  Procedure(s) performed (including Critical Care):  .1-3 Lead EKG Interpretation Performed by: Vladimir Crofts, MD Authorized by: Vladimir Crofts, MD     Interpretation: normal     ECG rate:  90   ECG rate assessment: normal     Rhythm: sinus rhythm     Ectopy: none     Conduction: normal      Medications  oxyCODONE-acetaminophen (PERCOCET/ROXICET) 5-325 MG per tablet 1 tablet (1 tablet Oral Given 07/20/20 1443)  ondansetron (ZOFRAN-ODT) disintegrating tablet 4 mg (4 mg Oral Given 07/20/20 1443)  droperidol (INAPSINE) 2.5 MG/ML injection 2.5 mg (2.5 mg Intramuscular Given 07/20/20 1654)  dicyclomine (BENTYL) capsule 10 mg (10 mg Oral Given 07/20/20 1653)     ____________________________________________   MDM / ED COURSE   50 year old woman with IBS presents to the ED with acute on chronic abdominal discomfort, most consistent with IBS and amenable to outpatient management.  Normal vitals on room air.  Exam generally reassuring with no evidence of distress or peritoneal abdomen.  She looks well overall without neurovascular deficits.  Mild and poorly localizing diffuse abdominal tenderness to palpation without CVA tenderness.  Urine without infectious features suggest UTI at the source of her pain.  Blood work shows no acute pathology.  CT abdomen/pelvis, without contrast due to her allergies, without evidence of colitis, biliary pathology or SBO.  Patient has resolution of symptoms after droperidol and Bentyl.  I suspect her symptoms are due to IBS.  We discussed adherence to her Bentyl, which she has not been taking, and following up with her PCP.  Return precautions for the ED were discussed prior to discharge.   Clinical Course as of 07/20/20 1808  Mon Jul 20, 2020  1710 Reassessed.  Patient ports improving symptoms.  We discussed benign work-up so far.  She reports that she is able to void at this point.  She ambulates independently to the restroom to provide urine sample.  She has tolerated p.o. intake of liquids so far without complication. [DS]    Clinical Course User Index [DS] Vladimir Crofts, MD    ____________________________________________   FINAL CLINICAL IMPRESSION(S) / ED DIAGNOSES  Final diagnoses:  Generalized abdominal pain     ED Discharge Orders    None       Corderius Saraceni Tamala Julian   Note:  This document was prepared using Dragon voice recognition software and may include unintentional dictation errors.   Vladimir Crofts, MD 07/20/20 [phone]

## 2020-07-20 NOTE — Telephone Encounter (Signed)
Patient called to say she is going to ED now!!

## 2020-07-20 NOTE — ED Triage Notes (Signed)
Patient c/o abdominal pain with diarrhea. Also reports stools have been tannish/white since Wednesday 2/9. Pain worsening after eating.

## 2020-07-21 ENCOUNTER — Encounter: Payer: Self-pay | Admitting: *Deleted

## 2020-07-22 MED FILL — LOSARTAN-HCTZ 50-12.5 MG TA: 50-12.5 | 30 days supply | Qty: 30 | Fill #2

## 2020-07-23 ENCOUNTER — Other Ambulatory Visit: Payer: Self-pay

## 2020-07-23 ENCOUNTER — Ambulatory Visit
Admission: RE | Admit: 2020-07-23 | Discharge: 2020-07-23 | Disposition: A | Discharge status: Home / Self Care | Payer: 59 | Source: Ambulatory Visit | Attending: Internal Medicine | Admitting: Internal Medicine

## 2020-07-23 DIAGNOSIS — K760 Fatty (change of) liver, not elsewhere classified: Secondary | ICD-10-CM | POA: Diagnosis not present

## 2020-07-23 DIAGNOSIS — R748 Abnormal levels of other serum enzymes: Secondary | ICD-10-CM | POA: Diagnosis not present

## 2020-07-23 DIAGNOSIS — R1013 Epigastric pain: Secondary | ICD-10-CM | POA: Diagnosis not present

## 2020-07-24 ENCOUNTER — Other Ambulatory Visit: Payer: Self-pay

## 2020-07-24 NOTE — Progress Notes (Signed)
[facility] Note  07/28/2020     CHIEF COMPLAINT Patient presents for Retina Follow Up   HISTORY OF PRESENT ILLNESS: [name] is a 50 y.o. female who presents to the clinic today for:   HPI    Retina Follow Up    Patient presents with  Other.  In both eyes.  This started years ago.  Severity is moderate.  Duration of 3 months.  Since onset it is rapidly improving.  I, the attending physician,  performed the HPI with the patient and updated documentation appropriately.          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  VA OU seems about the same.  Denies pain, FOL, floaters.  AT prn OU.  BS 110 yesterday.  A1C 6.0 11.2021.       Last edited by Bernarda Caffey, MD on 07/28/2020  3:21 PM. (History)    pt states she is "much better", she states she has no eye pain and only occasionally uses AT's, no other drops  Referring physician: Crecencio Mc, MD [place],  [place] [address]  HISTORICAL INFORMATION:   Selected notes from the MEDICAL RECORD NUMBER Self referral for anterior uveitis   CURRENT MEDICATIONS: Current Outpatient Medications (Ophthalmic Drugs)  Medication Sig  . Bromfenac Sodium (PROLENSA) 0.07 % SOLN Place 1 drop into both eyes 4 (four) times daily.   No current facility-administered medications for this visit. (Ophthalmic Drugs)   Current Outpatient Medications (Other)  Medication Sig  . albuterol (VENTOLIN HFA) 108 (90 Base) MCG/ACT inhaler Inhale 2 puffs into the lungs every 6 (six) hours as needed for wheezing or shortness of breath.  . ALPRAZolam (XANAX) 0.5 MG tablet Take 1 tablet (0.5 mg total) by mouth 3 (three) times daily as needed (Headache).  [name] aspirin EC 81 MG tablet Take 81 mg by mouth daily.  [name] atorvastatin (LIPITOR) 20 MG tablet TAKE 1 TABLET BY MOUTH DAILY.  [name] Blood Glucose Monitoring Suppl (FREESTYLE LITE) DEVI 1 each by Does not apply route 2 (two)  times daily. E11.9  . butalbital-acetaminophen-caffeine (FIORICET) 50-325-40 MG tablet Take 1 tablet by mouth every 6 (six) hours as needed for headache. Do not refill in less than 30 day  . diazepam (VALIUM) 10 MG tablet SMARTSIG:2 Tablet(s) By Mouth  . diazepam (VALIUM) 2 MG tablet Take 1 tablet (2 mg total) by mouth every 6 (six) hours as needed (Headache).  . dicyclomine (BENTYL) 20 MG tablet Take 1 tablet (20 mg total) by mouth every 6 (six) hours.  [name] diltiazem (CARDIZEM LA) 240 MG 24 hr tablet TAKE 1 TABLET BY MOUTH DAILY  . EPINEPHrine 0.3 mg/0.3 mL IJ SOAJ injection Inject 0.3 mLs (0.3 mg total) into the muscle as needed (for allergic reaction).  . Fremanezumab-vfrm (AJOVY) 225 MG/1.5ML SOSY Inject 4.5 mLs into the skin every 3 (three) months. Dispense 3 syringes  . glucose blood (FREESTYLE LITE) test strip 1 each by Other route 2 (two) times daily. E11.9  . hydrochlorothiazide (MICROZIDE) 12.5 MG capsule 1 capsule in the morning  . insulin degludec (TRESIBA FLEXTOUCH) 100 UNIT/ML FlexTouch Pen Inject 10 Units into the skin daily.  . insulin lispro (HUMALOG KWIKPEN) 100 UNIT/ML KwikPen Inject 20 Units into the skin 2 (two) times daily with a meal.  . Insulin Pen Needle (PEN NEEDLES 31GX5/16") 31G X 8 MM MISC 1 each by Does  not apply route in the morning, at noon, in the evening, and at bedtime. E11.9  . Lancets (FREESTYLE) lancets 1 each by Other route 2 (two) times daily. E11.9  . lisdexamfetamine (VYVANSE) 50 MG capsule 1 capsule in the morning  . losartan (COZAAR) 50 MG tablet 1 tablet  . losartan-hydrochlorothiazide (HYZAAR) 50-12.5 MG tablet Take 1 tablet by mouth daily.  . metoprolol succinate (TOPROL-XL) 25 MG 24 hr tablet TAKE 1/2 TABLET BY MOUTH DAILY.  [name] ondansetron (ZOFRAN ODT) 4 MG disintegrating tablet Take 1 tablet (4 mg total) by mouth every 8 (eight) hours as needed for nausea or vomiting.  [name] OXTELLAR XR 150 MG TB24 Take 1 tablet by mouth at bedtime.  [name]  oxyCODONE-acetaminophen (PERCOCET/ROXICET) 5-325 MG tablet Take by mouth as needed for severe pain.  . Semaglutide, 1 MG/DOSE, (OZEMPIC, 1 MG/DOSE,) 2 MG/1.5ML SOPN Inject 0.75 mLs (1 mg total) into the skin once a week.  . Vitamin D, Ergocalciferol, (DRISDOL) 1.25 MG (50000 UNIT) CAPS capsule 1 capsule   No current facility-administered medications for this visit. (Other)      REVIEW OF SYSTEMS: ROS    Positive for: Gastrointestinal, Endocrine, Cardiovascular, Eyes   Negative for: Constitutional, Neurological, Skin, Genitourinary, Musculoskeletal, HENT, Respiratory, Psychiatric, Allergic/Imm, Heme/Lymph   Last edited by Matthew Folks, COA on 07/28/2020  3:12 PM. (History)       ALLERGIES Allergies  Allergen Reactions  . Bamlanivimab Anaphylaxis, Itching, Palpitations, Rash, Shortness Of Breath and Swelling  . Botox [Onabotulinumtoxina] Shortness Of Breath    syncope  . Clindamycin/Lincomycin Other (See Comments)  . Kiwi Extract Shortness Of Breath  . Maxalt [Rizatriptan Benzoate] Anaphylaxis    Chest pain  . Nitrous Oxide Shortness Of Breath    syncope  . Triptans Shortness Of Breath  . Aspirin Nausea And Vomiting    Mouth blisters  . Contrast Media [Iodinated Diagnostic Agents]   . Flagyl [Metronidazole]   . Hydrocodone-Acetaminophen Other (See Comments)  . Latex     Sometimes causes rash  . Mango Flavor   . Rizatriptan Other (See Comments)  . Vicodin [Hydrocodone-Acetaminophen]     hallucinations  . Influenza Virus Vaccine Palpitations  . Penicillins Nausea And Vomiting, Rash and Other (See Comments)    Did it involve swelling of the face/tongue/throat, SOB, or low BP? No Did it involve sudden or severe rash/hives, skin peeling, or any reaction on the inside of your mouth or nose? No Did you need to seek medical attention at a hospital or doctor's office? Yes When did it last happen?patient was 50 years old If all above answers are "NO", may proceed with  cephalosporin use.  . Tetracyclines & Related Nausea And Vomiting and Rash    PAST MEDICAL HISTORY Past Medical History:  Diagnosis Date  . Allergy   . Anemia   . Anxiety    claustrophobic  . Asthma   . Back pain   . Biceps tendonosis of right shoulder   . Cataract    Mixed OU  . Diabetes mellitus 2011   did not start metforfin, losing weight  . Dyspnea   . Fatty liver   . Food allergy   . Headache disorder   . History of concussion   . HLD (hyperlipidemia)   . Hypertension   . Hypertensive retinopathy    OU  . IBS (irritable bowel syndrome)   . Infertility, female   . Joint pain   . Lactose intolerance   . Leg edema   .  Migraines   . Palpitation   . Post-menopausal   . Seborrheic dermatitis   . Shoulder impingement syndrome, right   . Vitamin D deficiency    Past Surgical History:  Procedure Laterality Date  . ABDOMINAL HYSTERECTOMY  2006   heavy menses, endometriosis, l oophrectomy  . BACK SURGERY    . BREAST BIOPSY Right 2018   benign  . BREAST EXCISIONAL BIOPSY Right 2003   benign  . BREAST EXCISIONAL BIOPSY Right 1999   benign  . BREAST SURGERY     right breast x 2 , benign  . HERNIA REPAIR  2003   left inguinal   . LEFT OOPHORECTOMY    . SHOULDER ARTHROSCOPY WITH SUBACROMIAL DECOMPRESSION AND OPEN ROTATOR C Right 07/14/2016   Procedure: RIGHT SHOULDER ARTHROSCOPY WITH SUBACROMIAL DECOMPRESSION, DISTAL CLAVICLE RESECTION AND MINI OPEN ROTATOR CUFF REPAIR, OPEN BICEP TENDODESIS;  Surgeon: Garald Balding, MD;  Location: [facility];  Service: Orthopedics;  Laterality: Right;  . SHOULDER CLOSED REDUCTION Right 09/08/2016   Procedure: RIGHT CLOSED MANIPULATION SHOULDER;  Surgeon: Garald Balding, MD;  Location: [facility];  Service: Orthopedics;  Laterality: Right;  . TUBAL LIGATION      FAMILY HISTORY Family History  Problem Relation Age of Onset  . Diabetes Mother   . Heart disease Mother 31  . Hypertension Mother    . Hyperlipidemia Mother   . Heart failure Mother   . Stroke Mother   . Thyroid disease Mother   . Depression Mother   . Sleep apnea Mother   . Obesity Mother   . Cancer Father 34       Lung Cancer  . Heart disease Father   . Mental illness Sister        bipolar, substance abuse,  clean 2 yrs  . Hypertension Sister   . Cancer Paternal Grandmother 55       breast cancer  . Breast cancer Paternal Grandmother   . Diabetes Maternal Grandmother   . Hypertension Maternal Grandmother   . Diabetes Son     SOCIAL HISTORY Social History   Tobacco Use  . Smoking status: Never Smoker  . Smokeless tobacco: Never Used  Vaping Use  . Vaping Use: Never used  Substance Use Topics  . Alcohol use: No  . Drug use: No         OPHTHALMIC EXAM:  Base Eye Exam    Visual Acuity (Snellen - Linear)      Right Left   Dist cc 20/25 -2 20/25 +   Dist ph cc 20/20 -2 20/20 -   Correction: Glasses       Tonometry (Tonopen, 3:16 PM)      Right Left   Pressure 13 13       Pupils      Dark Light Shape React APD   Right 5 4 Round Brisk None   Left 5 4 Round Brisk None       Visual Fields (Counting fingers)      Left Right    Full Full       Extraocular Movement      Right Left    Full, Ortho Full, Ortho       Neuro/Psych    Oriented x3: Yes   Mood/Affect: Normal       Dilation    Both eyes: 1.0% Mydriacyl, 2.5% Phenylephrine @ 3:16 PM        Slit Lamp and Fundus Exam  Slit Lamp Exam      Right Left   Lids/Lashes Normal Normal   Conjunctiva/Sclera Melanosis, trace Injection Melanosis, no injection   Cornea arcus, trace Punctate epithelial erosions, mild Debris in tear film, decreased TBUT arcus, 1-2+ inferior Punctate epithelial erosions, mild tear film debris   Anterior Chamber deep and clear, No cell or flare deep and clear, No cell or flare   Iris Round and dilated Round and reactive   Lens 2+ Nuclear sclerosis, 2+ Cortical cataract 2+ Nuclear sclerosis, 2+  Cortical cataract   Vitreous no cell or vitritis, Vitreous syneresis no cell or vitritis, Vitreous syneresis       Fundus Exam      Right Left   Disc Pink and Sharp, +cupping Pink and Sharp, +cupping, mild PPP   C/D Ratio 0.6 0.6   Macula Flat, Good foveal reflex, RPE mottling, No heme or edema Flat, Good foveal reflex, RPE mottling, No heme or edema   Vessels mild attenuation, mild AV crossing changes mild attenuation   Periphery Attached, mild White without pressure temporally Attached, mild White without pressure          IMAGING AND PROCEDURES  Imaging and Procedures for 07/28/2020  POCT glycosylated hemoglobin (Hb A1C)     Component Value Flag Ref Range Units Status   Hemoglobin A1C 6.1      4.0 - 5.6 % Final   HbA1c POC (<> result, manual entry)            HbA1c, POC (prediabetic range)            HbA1c, POC (controlled diabetic range)                 OCT, Retina - OU - Both Eyes       Right Eye Quality was good. Central Foveal Thickness: 245. Progression has been stable. Findings include normal foveal contour, no IRF, no SRF, vitreomacular adhesion .   Left Eye Quality was good. Central Foveal Thickness: 238. Progression has been stable. Findings include normal foveal contour, no IRF, no SRF, vitreomacular adhesion .   Notes *Images captured and stored on drive  Diagnosis / Impression:  NFP; no IRF/SRF  Clinical management:  See below  Abbreviations: NFP - Normal foveal profile. CME - cystoid macular edema. PED - pigment epithelial detachment. IRF - intraretinal fluid. SRF - subretinal fluid. EZ - ellipsoid zone. ERM - epiretinal membrane. ORA - outer retinal atrophy. ORT - outer retinal tubulation. SRHM - subretinal hyper-reflective material. 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 Retinal edema  H35.81 OCT, Retina - OU - Both Eyes  3. Essential hypertension  I10    4. Hypertensive retinopathy of both eyes  H35.033   5. Diabetes mellitus type 2 without retinopathy ([place])  E11.9   6. Combined forms of age-related cataract of both eyes  H25.813     1,2. Anterior uveitis OU  - eye pain, redness, photophobia since Dec 2020, after getting COVID infection (was hospitalized for 2 wks)  - was previously managed by [facility]  - underwent limited lab work up on 06/21/19   Quant-Gold, ACE, ANA, HLA-B27 -- all normal   ESR slightly elevated (37)  - initial exam here 08.18.21 -- +conj injection OU (OD > OS); 1-2+ AC cell OU; no evidence of posterior involvement  -  today, BCVA OU 20/20: corneal dryness significantly improved and exam shows stable improvement in inflammation  - suspect most of pt's symptoms related to cornea more than anterior uveitis -- working long hours in front of computer  - OCT without macular edema OU  - FA 08.25.21 shows Mild focal late perivascular leakage temporal periphery OU -- ?inflammatory vs HTN  - IOP 13 OU  - Drops: AT prn OU  - pt looking to change primary eye care provider (was at [facility])  - will refer to Dr. Kathlen Mody for general ophthalmology care  - pt is cleared from a retina standpoint   - can f/u here prn  3,4. Hypertensive retinopathy OU - discussed importance of tight BP control - monitor  5. Diabetes mellitus, type 2 without retinopathy  - The incidence, risk factors for progression, natural history and treatment options for diabetic retinopathy  were discussed with patient.    - The need for close monitoring of blood glucose, blood pressure, and serum lipids, avoiding cigarette or any type of tobacco, and the need for long term follow up was also discussed with patient.  - f/u in 1 year, sooner prn  6. Mixed cataract OU  - The symptoms of cataract, surgical options, and treatments and risks were discussed with patient.  - discussed diagnosis and progression  - not yet visually significant  -  monitor for now   Ophthalmic Meds Ordered this visit:  No orders of the defined types were placed in this encounter.     Return if symptoms worsen or fail to improve.  There are no Patient Instructions on file for this visit.  This document serves as a record of services personally performed by Gardiner Sleeper, MD, PhD. It was created on their behalf by Estill Bakes, COT an ophthalmic technician. The creation of this record is the provider's dictation and/or activities during the visit.    Electronically signed by: Estill Bakes, COT 2.18.22 @ 4:36 PM   This document serves as a record of services personally performed by Gardiner Sleeper, MD, PhD. It was created on their behalf by San Jetty. Owens Shark, OA an ophthalmic technician. The creation of this record is the provider's dictation and/or activities during the visit.    Electronically signed by: San Jetty. Owens Shark, [place] 02.22.2022 4:36 PM  Gardiner Sleeper, M.D., Ph.D. Diseases & Surgery of the Retina and [place] 07/28/2020   I have reviewed the above documentation for accuracy and completeness, and I agree with the above. Gardiner Sleeper, M.D., Ph.D. 07/28/20 4:36 PM   Abbreviations: M myopia (nearsighted); A astigmatism; H hyperopia (farsighted); P presbyopia; Mrx spectacle prescription;  CTL contact lenses; OD right eye; OS left eye; OU both eyes  XT exotropia; ET esotropia; PEK punctate epithelial keratitis; PEE punctate epithelial erosions; DES dry eye syndrome; MGD meibomian gland dysfunction; ATs artificial tears; PFAT's preservative free artificial tears; [place] nuclear sclerotic cataract; PSC posterior subcapsular cataract; ERM epi-retinal membrane; PVD posterior vitreous detachment; RD retinal detachment; DM diabetes mellitus; DR diabetic retinopathy; NPDR non-proliferative diabetic retinopathy; PDR proliferative diabetic retinopathy; CSME clinically significant macular edema; DME diabetic macular  edema; dbh dot blot hemorrhages; CWS cotton wool spot; POAG primary open angle glaucoma; C/D cup-to-disc ratio; HVF humphrey visual field; GVF goldmann visual field; OCT optical coherence tomography; IOP intraocular pressure; BRVO Branch retinal vein occlusion; CRVO central retinal vein occlusion; CRAO central retinal artery occlusion; BRAO branch retinal artery occlusion; RT retinal tear; SB  scleral buckle; PPV pars plana vitrectomy; VH Vitreous hemorrhage; PRP panretinal laser photocoagulation; IVK intravitreal kenalog; VMT vitreomacular traction; MH Macular hole;  NVD neovascularization of the disc; NVE neovascularization elsewhere; AREDS age related eye disease study; ARMD age related macular degeneration; POAG primary open angle glaucoma; EBMD epithelial/anterior basement membrane dystrophy; ACIOL anterior chamber intraocular lens; IOL intraocular lens; PCIOL posterior chamber intraocular lens; Phaco/IOL phacoemulsification with intraocular lens placement; Ulmer photorefractive keratectomy; LASIK laser assisted in situ keratomileusis; HTN hypertension; DM diabetes mellitus; COPD chronic obstructive pulmonary disease

## 2020-07-26 ENCOUNTER — Other Ambulatory Visit: Payer: Self-pay | Admitting: Internal Medicine

## 2020-07-26 DIAGNOSIS — R197 Diarrhea, unspecified: Secondary | ICD-10-CM

## 2020-07-26 DIAGNOSIS — K760 Fatty (change of) liver, not elsewhere classified: Secondary | ICD-10-CM | POA: Insufficient documentation

## 2020-07-26 NOTE — Assessment & Plan Note (Signed)
Suggested by ultrasound

## 2020-07-26 NOTE — Assessment & Plan Note (Signed)
CT abd and RUQ ultrasound nondiagnostic.  Fatty liver noted.

## 2020-07-26 NOTE — Assessment & Plan Note (Signed)
Stool studies including elastase , fecal fat, GI pathogens,  O & P ordered.

## 2020-07-28 ENCOUNTER — Ambulatory Visit (INDEPENDENT_AMBULATORY_CARE_PROVIDER_SITE_OTHER): Payer: 59 | Admitting: Ophthalmology

## 2020-07-28 ENCOUNTER — Ambulatory Visit: Payer: 59 | Admitting: Endocrinology

## 2020-07-28 ENCOUNTER — Other Ambulatory Visit: Payer: Self-pay

## 2020-07-28 ENCOUNTER — Encounter (INDEPENDENT_AMBULATORY_CARE_PROVIDER_SITE_OTHER): Payer: Self-pay | Admitting: Ophthalmology

## 2020-07-28 VITALS — BP 120/80 | HR 82 | Ht 66.0 in | Wt 212.6 lb

## 2020-07-28 DIAGNOSIS — I10 Essential (primary) hypertension: Secondary | ICD-10-CM

## 2020-07-28 DIAGNOSIS — H25813 Combined forms of age-related cataract, bilateral: Secondary | ICD-10-CM | POA: Diagnosis not present

## 2020-07-28 DIAGNOSIS — E1165 Type 2 diabetes mellitus with hyperglycemia: Secondary | ICD-10-CM | POA: Diagnosis not present

## 2020-07-28 DIAGNOSIS — E119 Type 2 diabetes mellitus without complications: Secondary | ICD-10-CM | POA: Diagnosis not present

## 2020-07-28 DIAGNOSIS — H209 Unspecified iridocyclitis: Secondary | ICD-10-CM | POA: Diagnosis not present

## 2020-07-28 DIAGNOSIS — H35033 Hypertensive retinopathy, bilateral: Secondary | ICD-10-CM

## 2020-07-28 DIAGNOSIS — H3581 Retinal edema: Secondary | ICD-10-CM

## 2020-07-28 LAB — POCT GLYCOSYLATED HEMOGLOBIN (HGB A1C): Hemoglobin A1C: 6.1 % — AB (ref 4.0–5.6)

## 2020-07-28 MED FILL — AJOVY 225 MG/1.5ML SOSY: 225 | 84 days supply | Qty: 5 | Fill #0

## 2020-07-28 NOTE — Patient Instructions (Addendum)
Please stay off the metformin,, and:  Please continue the same insulins. check your blood sugar twice a day.  vary the time of day when you check, between before the 3 meals, and at bedtime.  also check if you have symptoms of your blood sugar being too high or too low.  please keep a record of the readings and bring it to your next appointment here (or you can bring the meter itself).  You can write it on any piece of paper.  please call us sooner if your blood sugar goes below 70, or if you have a lot of readings over 200.  After the steroid shot, take 5 extra units of humalog for any blood sugar in the 200's, and 10 extra for any over 300.   Please come back for a follow-up appointment in approx 3 months.

## 2020-07-28 NOTE — Progress Notes (Signed)
Subjective:    Patient ID: [name], female    DOB: [birth year], 50 y.o.   MRN: [id number]  HPI Pt returns for f/u of diabetes mellitus: DM type: Insulin-requiring type 2 Dx'ed: 6568 Complications: DN Therapy: insulin since 2018, Ozempic, and metformin.   GDM: 1990 and 2004 DKA: never Severe hypoglycemia: never Pancreatitis: never Pancreatic imaging: normal on 2019 CT.  SDOH: none Other: She did not tolerate Jardiance (vaginitis) or Victoza (dysphagia); she takes multiple daily injections (but she takes humalog BID, as she eats 2 meals per day); she works 1st shift.   Interval history: no recent steroids.  Pt says fasting cbg's are in the low-100's.  She stopped metformin a few days ago, due to bloating and diarrhea.  She says [name] is 10/d, and humalog is 20 units QD-BID.   Past Medical History:  Diagnosis Date  . Allergy   . Anemia   . Anxiety    claustrophobic  . Asthma   . Back pain   . Biceps tendonosis of right shoulder   . Cataract    Mixed OU  . Diabetes mellitus 2011   did not start metforfin, losing weight  . Dyspnea   . Fatty liver   . Food allergy   . Headache disorder   . History of concussion   . HLD (hyperlipidemia)   . Hypertension   . Hypertensive retinopathy    OU  . IBS (irritable bowel syndrome)   . Infertility, female   . Joint pain   . Lactose intolerance   . Leg edema   . Migraines   . Palpitation   . Post-menopausal   . Seborrheic dermatitis   . Shoulder impingement syndrome, right   . Vitamin D deficiency     Past Surgical History:  Procedure Laterality Date  . ABDOMINAL HYSTERECTOMY  2006   heavy menses, endometriosis, l oophrectomy  . BACK SURGERY    . BREAST BIOPSY Right 2018   benign  . BREAST EXCISIONAL BIOPSY Right 2003   benign  . BREAST EXCISIONAL BIOPSY Right 1999   benign  . BREAST SURGERY     right breast x 2 , benign  . HERNIA REPAIR  2003   left inguinal   . LEFT OOPHORECTOMY    . SHOULDER  ARTHROSCOPY WITH SUBACROMIAL DECOMPRESSION AND OPEN ROTATOR C Right 07/14/2016   Procedure: RIGHT SHOULDER ARTHROSCOPY WITH SUBACROMIAL DECOMPRESSION, DISTAL CLAVICLE RESECTION AND MINI OPEN ROTATOR CUFF REPAIR, OPEN BICEP TENDODESIS;  Surgeon: Garald Balding, MD;  Location: [facility];  Service: Orthopedics;  Laterality: Right;  . SHOULDER CLOSED REDUCTION Right 09/08/2016   Procedure: RIGHT CLOSED MANIPULATION SHOULDER;  Surgeon: Garald Balding, MD;  Location: [facility];  Service: Orthopedics;  Laterality: Right;  . TUBAL LIGATION      Social History   Socioeconomic History  . Marital status: Divorced    Spouse name: Not on file  . Number of children: 2  . Years of education: 54  . Highest education level: Master's degree (e.g., MA, MS, MEng, MEd, MSW, MBA)  Occupational History  . 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 Smoking status: Never Smoker  . Smokeless tobacco: Never Used  Vaping Use  . Vaping Use: Never used  Substance and Sexual Activity  . Alcohol use: No  . Drug use: No  . Sexual activity: Not Currently    Partners: Male    Birth control/protection:  Surgical  Other Topics Concern  . Not on file  Social History Narrative   Lives at home with son and daughter.   Right-handed.   1-3 cups caffeine weekly.   Social Determinants of Health   Financial Resource Strain: Not on file  Food Insecurity: Not on file  Transportation Needs: Not on file  Physical Activity: Not on file  Stress: Not on file  Social Connections: Not on file  Intimate Partner Violence: Not on file    Current Outpatient Medications on File Prior to Visit  Medication Sig Dispense Refill  . albuterol (VENTOLIN HFA) 108 (90 Base) MCG/ACT inhaler Inhale 2 puffs into the lungs every 6 (six) hours as needed for wheezing or shortness of breath. 18 g 0  . ALPRAZolam (XANAX) 0.5 MG tablet Take 1 tablet (0.5 mg total) by mouth 3 (three)  times daily as needed (Headache).    [name] aspirin EC 81 MG tablet Take 81 mg by mouth daily.    [name] atorvastatin (LIPITOR) 20 MG tablet TAKE 1 TABLET BY MOUTH DAILY. 90 tablet 3  . Blood Glucose Monitoring Suppl (FREESTYLE LITE) DEVI 1 each by Does not apply route 2 (two) times daily. E11.9 1 each 0  . Bromfenac Sodium (PROLENSA) 0.07 % SOLN Place 1 drop into both eyes 4 (four) times daily. 6 mL 3  . butalbital-acetaminophen-caffeine (FIORICET) 50-325-40 MG tablet Take 1 tablet by mouth every 6 (six) hours as needed for headache. Do not refill in less than 30 day 30 tablet 1  . diazepam (VALIUM) 10 MG tablet SMARTSIG:2 Tablet(s) By Mouth    . diazepam (VALIUM) 2 MG tablet Take 1 tablet (2 mg total) by mouth every 6 (six) hours as needed (Headache). 30 tablet 5  . dicyclomine (BENTYL) 20 MG tablet Take 1 tablet (20 mg total) by mouth every 6 (six) hours. 90 tablet 1  . diltiazem (CARDIZEM LA) 240 MG 24 hr tablet TAKE 1 TABLET BY MOUTH DAILY 90 tablet 1  . EPINEPHrine 0.3 mg/0.3 mL IJ SOAJ injection Inject 0.3 mLs (0.3 mg total) into the muscle as needed (for allergic reaction). 1 each 3  . Fremanezumab-vfrm (AJOVY) 225 MG/1.5ML SOSY Inject 4.5 mLs into the skin every 3 (three) months. Dispense 3 syringes 4.5 mL 4  . glucose blood (FREESTYLE LITE) test strip 1 each by Other route 2 (two) times daily. E11.9 200 each 0  . hydrochlorothiazide (MICROZIDE) 12.5 MG capsule 1 capsule in the morning    . insulin degludec (TRESIBA FLEXTOUCH) 100 UNIT/ML FlexTouch Pen Inject 10 Units into the skin daily. 6 mL 0  . insulin lispro (HUMALOG KWIKPEN) 100 UNIT/ML KwikPen Inject 20 Units into the skin 2 (two) times daily with a meal. 67.5 mL 0  . Insulin Pen Needle (PEN NEEDLES 31GX5/16") 31G X 8 MM MISC 1 each by Does not apply route in the morning, at noon, in the evening, and at bedtime. E11.9 360 each 0  . Lancets (FREESTYLE) lancets 1 each by Other route 2 (two) times daily. E11.9 200 each 0  . lisdexamfetamine  (VYVANSE) 50 MG capsule 1 capsule in the morning    . losartan (COZAAR) 50 MG tablet 1 tablet    . losartan-hydrochlorothiazide (HYZAAR) 50-12.5 MG tablet Take 1 tablet by mouth daily. 90 tablet 3  . metoprolol succinate (TOPROL-XL) 25 MG 24 hr tablet TAKE 1/2 TABLET BY MOUTH DAILY. 45 tablet 2  . ondansetron (ZOFRAN ODT) 4 MG disintegrating tablet Take 1 tablet (4 mg total) by mouth  every 8 (eight) hours as needed for nausea or vomiting. 20 tablet 0  . OXTELLAR XR 150 MG TB24 Take 1 tablet by mouth at bedtime.    [name] oxyCODONE-acetaminophen (PERCOCET/ROXICET) 5-325 MG tablet Take by mouth as needed for severe pain.    . Semaglutide, 1 MG/DOSE, (OZEMPIC, 1 MG/DOSE,) 2 MG/1.5ML SOPN Inject 0.75 mLs (1 mg total) into the skin once a week. 3 pen 3  . Vitamin D, Ergocalciferol, (DRISDOL) 1.25 MG (50000 UNIT) CAPS capsule 1 capsule     No current facility-administered medications on file prior to visit.    Allergies  Allergen Reactions  . Bamlanivimab Anaphylaxis, Itching, Palpitations, Rash, Shortness Of Breath and Swelling  . Botox [Onabotulinumtoxina] Shortness Of Breath    syncope  . Clindamycin/Lincomycin Other (See Comments)  . Kiwi Extract Shortness Of Breath  . Maxalt [Rizatriptan Benzoate] Anaphylaxis    Chest pain  . Nitrous Oxide Shortness Of Breath    syncope  . Triptans Shortness Of Breath  . Aspirin Nausea And Vomiting    Mouth blisters  . Contrast Media [Iodinated Diagnostic Agents]   . Flagyl [Metronidazole]   . Hydrocodone-Acetaminophen Other (See Comments)  . Latex     Sometimes causes rash  . Mango Flavor   . Rizatriptan Other (See Comments)  . Vicodin [Hydrocodone-Acetaminophen]     hallucinations  . Influenza Virus Vaccine Palpitations  . Penicillins Nausea And Vomiting, Rash and Other (See Comments)    Did it involve swelling of the face/tongue/throat, SOB, or low BP? No Did it involve sudden or severe rash/hives, skin peeling, or any reaction on the inside of  your mouth or nose? No Did you need to seek medical attention at a hospital or doctor's office? Yes When did it last happen?patient was 50 years old If all above answers are "NO", may proceed with cephalosporin use.  . Tetracyclines & Related Nausea And Vomiting and Rash    Family History  Problem Relation Age of Onset  . Diabetes Mother   . Heart disease Mother 54  . Hypertension Mother   . Hyperlipidemia Mother   . Heart failure Mother   . Stroke Mother   . Thyroid disease Mother   . Depression Mother   . Sleep apnea Mother   . Obesity Mother   . Cancer Father 38       Lung Cancer  . Heart disease Father   . Mental illness Sister        bipolar, substance abuse,  clean 2 yrs  . Hypertension Sister   . Cancer Paternal Grandmother 33       breast cancer  . Breast cancer Paternal Grandmother   . Diabetes Maternal Grandmother   . Hypertension Maternal Grandmother   . Diabetes Son     BP 120/80 (BP Location: Right Arm, Patient Position: Sitting, Cuff Size: Large)   Pulse 82   Ht 5\' 6"  (1.676 m)   Wt 212 lb 9.6 oz (96.4 kg)   SpO2 98%   BMI 34.31 kg/m    Review of Systems Nausea is mild.      Objective:   Physical Exam VITAL SIGNS:  See vs page GENERAL: no distress Pulses: dorsalis pedis intact bilat.   MSK: no deformity of the feet CV: no leg edema Skin:  no ulcer on the feet.  normal color and temp on the feet. Neuro: sensation is intact to touch on the feet.    Lab Results  Component Value Date   HGBA1C 6.1 (  A) 07/28/2020   Lab Results  Component Value Date   CREATININE 0.77 07/20/2020   BUN 9 07/20/2020   NA 140 07/20/2020   K 3.5 07/20/2020   CL 108 07/20/2020   CO2 24 07/20/2020       Assessment & Plan:  Insulin-requiring type 2 DM, with DN: overcontrolled.  GI sxs, due to metformin.   Patient Instructions  Please stay off the metformin,, and:  Please continue the same insulins. check your blood sugar twice a day.  vary the time of  day when you check, between before the 3 meals, and at bedtime.  also check if you have symptoms of your blood sugar being too high or too low.  please keep a record of the readings and bring it to your next appointment here (or you can bring the meter itself).  You can write it on any piece of paper.  please call us sooner if your blood sugar goes below 70, or if you have a lot of readings over 200.  After the steroid shot, take 5 extra units of humalog for any blood sugar in the 200's, and 10 extra for any over 300.   Please come back for a follow-up appointment in approx 3 months.

## 2020-08-04 NOTE — Telephone Encounter (Signed)
Called patient. States at some point she must have ran out of her Diltiazem and never got it refilled. Around 1-2 months ago. Recently saw PCP and BP was higher than usual. BP running 138/80's.  She is due for year f/u in April. Went ahead and scheduled her for f/u with Gilford Rile, NP next week. She is agreeable and then can be reevaluated as to whether to resume diltiazem at that time.

## 2020-08-06 ENCOUNTER — Other Ambulatory Visit (HOSPITAL_COMMUNITY): Payer: Self-pay | Admitting: Gastroenterology

## 2020-08-06 ENCOUNTER — Other Ambulatory Visit: Payer: Self-pay

## 2020-08-06 ENCOUNTER — Ambulatory Visit: Payer: 59 | Admitting: Gastroenterology

## 2020-08-06 ENCOUNTER — Encounter: Payer: Self-pay | Admitting: Gastroenterology

## 2020-08-06 VITALS — BP 132/77 | HR 75 | Ht 66.0 in | Wt 216.4 lb

## 2020-08-06 DIAGNOSIS — R1013 Epigastric pain: Secondary | ICD-10-CM

## 2020-08-06 DIAGNOSIS — R194 Change in bowel habit: Secondary | ICD-10-CM

## 2020-08-06 MED ORDER — DICYCLOMINE HCL 20 MG PO TABS: 20.0000 mg | ORAL_TABLET | Freq: Four times a day (QID) | ORAL | 1 refills | Status: DC

## 2020-08-06 MED FILL — DICYCLOMINE 20 MG TABLET: 20 | 23 days supply | Qty: 90 | Fill #0

## 2020-08-06 MED FILL — ATORVASTATIN CALCIUM 20 MG: 20 | 90 days supply | Qty: 90 | Fill #1

## 2020-08-06 NOTE — Patient Instructions (Addendum)
It was a pleasure to meet you today. Based on our discussion, I am providing you with my recommendations below:  RECOMMENDATION(S):   I am recommending an endoscopy and a colonoscopy to better evaluate your symptoms. I would also like for you to continue you Bentyl as needed. To ensure you have an ample supply, I will send in additional refills. We also discussed using PeptoBismal  Avoiding artificial sweeteners and carbonated beverages may help minimize your symptoms.   PRESCRIPTION MEDICATION(S):   We have sent the following medication(s) to your pharmacy:  . Bentyl - please continue 20mg  by mouth every 6 hours as needed  NOTE: If your medication(s) requires a PRIOR AUTHORIZATION, we will receive notification from your pharmacy. Once received, the process to submit for approval may take up to 7-10 business days. You will be contacted about any denials we have received from your insurance company as well as alternatives recommended by your provider.  COLONOSCOPY AND ENDOSCOPY:   . You have been scheduled for an endoscopy and a colonoscopy. Please follow the written instructions given to you at your visit today.  PREP:   . Please pick up your prep supplies at the pharmacy within the next 1-3 days.  INHALERS:   . If you use inhalers (even only as needed), please bring them with you on the day of your procedure.  MEDICATIONS TO HOLD:  . Please refer to your Prep instructions regarding your diabetic medications.  COLONOSCOPY TIPS:  . To reduce nausea and dehydration, stay well hydrated for 3-4 days prior to the exam.  . To prevent skin/hemorrhoid irritation - prior to wiping, put A&Dointment or vaseline on the toilet paper. [name] Keep a towel or pad on the bed.  [name] BEFORE STARTING YOUR PREP, drink  64oz of clear liquids in the morning. This will help to flush the colon and will ensure you are well hydrated!!!!  NOTE - This is in addition to the fluids required for to complete your  prep. . Use of a flavored hard candy, such as grape Anise Salvo, can counteract some of the flavor of the prep and may prevent some nausea.  BMI:  . If you are age 65 or younger, your body mass index should be between 19-25. Your Body mass index is 34.93 kg/m. If this is out of the aformentioned range listed, please consider follow up with your Primary Care Provider.   Thank you for trusting me with your gastrointestinal care!    Thornton Park, MD, MPH

## 2020-08-06 NOTE — Progress Notes (Addendum)
Referring Provider: Crecencio Mc, MD Primary Care Physician:  Crecencio Mc, MD  Reason for Consultation: Epigastric pain and diarrhea   IMPRESSION:  Epigastric pain not explained by CT or ultrasound Altered bowel habits Prior diagnosis of diarrhea-predominant IBS Lactose intolerance Need for colon cancer screening Fatty liver on ultrasound No known family history of colon cancer or polyps.   Epigastric pain - largely postprandial with associated loose stools: Associated with stress. Likely IBS. No alarm features. Must excluded concurrent disease such as chronic infection, celiac disease, IBD, food intolerance (lactose, fructose, sucrose), SIBO, pancreatic insufficiency, thyroid disorder.   PLAN: - Continue dicyclomine PRN, discussed using prior to meals - Stool of GI pathogen panel, fecal calprotectin, elastace - Discussed using PeptoBismal  - Avoid artificial sweeteners and carbonated beverages - EGD with biopsies - Colonoscopy with biopsies  Please see the "Patient Instructions" section for addition details about the plan.  HPI: [name] is a 51 y.o. female referred by Dr. Derrel Nip for further evaluation of epigastric pain and diarrhea.  The history is obtained through the patient and review of her electronic health record.  She has diabetes, asthma, history of diarrhea predominant IBS, chronic headaches, hypercholesterolemia, hypertension, nephrolithiasis, and uveitis.  She had Covid in December 2020.  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   IBS started in high school. History of lactose intolerance.   Episodic symptoms that have previously been controlled with dicyclomine. Will have 5-10, small volume, loose stools that are pale in color that appears like oatmeal or fat every couple of days.  Occasional postprandial pain/cramping.  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 No  fevers, chills, or night sweats. No blood or mucous in the stools.  Trial off gluten didn't provide any symptomatic relief.   Intentional weight loss using Ozempic. Although she has recently gained weight.   Symptomatic improvement after finding some old dicyclomine tablets.  Evaluated in the [facility] last month. - Liver enzymes, lipase, CBC were normal - CT of the abdomen and pelvis without contrast 07/20/2020 showed nonobstructing intrarenal stones bilaterally was otherwise negative - Abdominal ultrasound 07/23/2020 showed fatty liver  Review of epic shows a CT of the abdomen and pelvis with contrast in 2019 showing a small fat-containing umbilical hernia, moderate stool burden, nonobstructing bilateral renal calculi  No prior endoscopic history.  No known family history of colon cancer or polyps. No family history of uterine/endometrial cancer, pancreatic cancer or gastric/stomach cancer.   Past Medical History:  Diagnosis Date  . Allergy   . Anemia   . Anxiety    claustrophobic  . Asthma   . Back pain   . Biceps tendonosis of right shoulder   . Cataract    Mixed OU  . Diabetes mellitus 2011   did not start metforfin, losing weight  . Dyspnea   . Fatty liver   . Food allergy   . Headache disorder   . History of concussion   . HLD (hyperlipidemia)   . Hypertension   . Hypertensive retinopathy    OU  . IBS (irritable bowel syndrome)   . Infertility, female   . Joint pain   . Lactose intolerance   . Leg edema   . Migraines   . Palpitation   . Post-menopausal   . Seborrheic dermatitis   . Shoulder impingement syndrome, right   . Vitamin D deficiency  Past Surgical History:  Procedure Laterality Date  . ABDOMINAL HYSTERECTOMY  2006   heavy menses, endometriosis, l oophrectomy  . BACK SURGERY    . BREAST BIOPSY Right 2018   benign  . BREAST EXCISIONAL BIOPSY Right 2003   benign  . BREAST EXCISIONAL BIOPSY Right 1999   benign  . BREAST SURGERY      right breast x 2 , benign  . HERNIA REPAIR  2003   left inguinal   . LEFT OOPHORECTOMY    . SHOULDER ARTHROSCOPY WITH SUBACROMIAL DECOMPRESSION AND OPEN ROTATOR C Right 07/14/2016   Procedure: RIGHT SHOULDER ARTHROSCOPY WITH SUBACROMIAL DECOMPRESSION, DISTAL CLAVICLE RESECTION AND MINI OPEN ROTATOR CUFF REPAIR, OPEN BICEP TENDODESIS;  Surgeon: Garald Balding, MD;  Location: [facility];  Service: Orthopedics;  Laterality: Right;  . SHOULDER CLOSED REDUCTION Right 09/08/2016   Procedure: RIGHT CLOSED MANIPULATION SHOULDER;  Surgeon: Garald Balding, MD;  Location: [facility];  Service: Orthopedics;  Laterality: Right;  . TUBAL LIGATION      Current Outpatient Medications  Medication Sig Dispense Refill  . albuterol (VENTOLIN HFA) 108 (90 Base) MCG/ACT inhaler Inhale 2 puffs into the lungs every 6 (six) hours as needed for wheezing or shortness of breath. 18 g 0  . ALPRAZolam (XANAX) 0.5 MG tablet Take 1 tablet (0.5 mg total) by mouth 3 (three) times daily as needed (Headache).    [name] aspirin EC 81 MG tablet Take 81 mg by mouth daily.    [name] atorvastatin (LIPITOR) 20 MG tablet TAKE 1 TABLET BY MOUTH DAILY. 90 tablet 3  . Blood Glucose Monitoring Suppl (FREESTYLE LITE) DEVI 1 each by Does not apply route 2 (two) times daily. E11.9 1 each 0  . Bromfenac Sodium (PROLENSA) 0.07 % SOLN Place 1 drop into both eyes 4 (four) times daily. 6 mL 3  . butalbital-acetaminophen-caffeine (FIORICET) 50-325-40 MG tablet Take 1 tablet by mouth every 6 (six) hours as needed for headache. Do not refill in less than 30 day 30 tablet 1  . diazepam (VALIUM) 10 MG tablet SMARTSIG:2 Tablet(s) By Mouth    . diazepam (VALIUM) 2 MG tablet Take 1 tablet (2 mg total) by mouth every 6 (six) hours as needed (Headache). 30 tablet 5  . diltiazem (CARDIZEM LA) 240 MG 24 hr tablet TAKE 1 TABLET BY MOUTH DAILY 90 tablet 1  . EPINEPHrine 0.3 mg/0.3 mL IJ SOAJ injection Inject 0.3 mLs (0.3 mg total) into  the muscle as needed (for allergic reaction). 1 each 3  . Fremanezumab-vfrm (AJOVY) 225 MG/1.5ML SOSY Inject 4.5 mLs into the skin every 3 (three) months. Dispense 3 syringes 4.5 mL 4  . glucose blood (FREESTYLE LITE) test strip 1 each by Other route 2 (two) times daily. E11.9 200 each 0  . hydrochlorothiazide (MICROZIDE) 12.5 MG capsule 1 capsule in the morning    . insulin degludec (TRESIBA FLEXTOUCH) 100 UNIT/ML FlexTouch Pen Inject 10 Units into the skin daily. 6 mL 0  . insulin lispro (HUMALOG KWIKPEN) 100 UNIT/ML KwikPen Inject 20 Units into the skin 2 (two) times daily with a meal. 67.5 mL 0  . Insulin Pen Needle (PEN NEEDLES 31GX5/16") 31G X 8 MM MISC 1 each by Does not apply route in the morning, at noon, in the evening, and at bedtime. E11.9 360 each 0  . Lancets (FREESTYLE) lancets 1 each by Other route 2 (two) times daily. E11.9 200 each 0  . lisdexamfetamine (VYVANSE) 50 MG capsule 1 capsule in  the morning    . losartan (COZAAR) 50 MG tablet 1 tablet    . losartan-hydrochlorothiazide (HYZAAR) 50-12.5 MG tablet Take 1 tablet by mouth daily. 90 tablet 3  . metoprolol succinate (TOPROL-XL) 25 MG 24 hr tablet TAKE 1/2 TABLET BY MOUTH DAILY. 45 tablet 2  . ondansetron (ZOFRAN ODT) 4 MG disintegrating tablet Take 1 tablet (4 mg total) by mouth every 8 (eight) hours as needed for nausea or vomiting. 20 tablet 0  . OXTELLAR XR 150 MG TB24 Take 1 tablet by mouth at bedtime.    [name] oxyCODONE-acetaminophen (PERCOCET/ROXICET) 5-325 MG tablet Take by mouth as needed for severe pain.    . Semaglutide, 1 MG/DOSE, (OZEMPIC, 1 MG/DOSE,) 2 MG/1.5ML SOPN Inject 0.75 mLs (1 mg total) into the skin once a week. 3 pen 3  . Vitamin D, Ergocalciferol, (DRISDOL) 1.25 MG (50000 UNIT) CAPS capsule 1 capsule    . dicyclomine (BENTYL) 20 MG tablet Take 1 tablet (20 mg total) by mouth every 6 (six) hours. 90 tablet 1   No current facility-administered medications for this visit.    Allergies as of 08/06/2020 -  Review Complete 08/06/2020  Allergen Reaction Noted  . Bamlanivimab Anaphylaxis, Itching, Palpitations, Rash, Shortness Of Breath, and Swelling 05/24/2019  . Botox [onabotulinumtoxina] Shortness Of Breath 06/10/2014  . Clindamycin/lincomycin Other (See Comments) 07/22/2016  . Kiwi extract Shortness Of Breath 08/07/2015  . Maxalt [rizatriptan benzoate] Anaphylaxis 07/20/2011  . Nitrous oxide Shortness Of Breath 06/10/2014  . Triptans Shortness Of Breath 12/23/2014  . Aspirin Nausea And Vomiting 07/20/2011  . Contrast media [iodinated diagnostic agents]  09/20/2018  . Flagyl [metronidazole]  07/07/2016  . Hydrocodone-acetaminophen Other (See Comments) 07/24/2015  . Latex  07/20/2011  . Mango flavor  03/25/2016  . Rizatriptan Other (See Comments) 07/24/2015  . Vicodin [hydrocodone-acetaminophen]  07/20/2011  . Influenza virus vaccine Palpitations 02/25/2017  . Penicillins Nausea And Vomiting, Rash, and Other (See Comments) 07/20/2011  . Tetracyclines & related Nausea And Vomiting and Rash 07/20/2011    Family History  Problem Relation Age of Onset  . Diabetes Mother   . Heart disease Mother 26  . Hypertension Mother   . Hyperlipidemia Mother   . Heart failure Mother   . Stroke Mother   . Thyroid disease Mother   . Depression Mother   . Sleep apnea Mother   . Obesity Mother   . Cancer Father 64       Lung Cancer  . Heart disease Father   . Mental illness Sister        bipolar, substance abuse,  clean 2 yrs  . Hypertension Sister   . Cancer Paternal Grandmother 34       breast cancer  . Breast cancer Paternal Grandmother   . Diabetes Maternal Grandmother   . Hypertension Maternal Grandmother   . Diabetes Son      Review of Systems: 12 system ROS is negative except as noted above with the addition of vision change, migraines, hot flashes.   Physical Exam: General:   Alert,  well-nourished, pleasant and cooperative in NAD Head:  Normocephalic and atraumatic. Eyes:   Sclera clear, no icterus.   Conjunctiva pink. Ears:  Normal auditory acuity. Nose:  No deformity, discharge,  or lesions. Mouth:  No deformity or lesions.   Neck:  Supple; no masses or thyromegaly. Lungs:  Clear throughout to auscultation.   No wheezes. Heart:  Regular rate and rhythm; no murmurs. Abdomen:  Soft, no pain on exam but localizes  the pain to the midepigastrium and sometimes a lower abdominal cramping, , nondistended, normal bowel sounds, no rebound or guarding. No hepatosplenomegaly.   Rectal:  Deferred  Msk:  Symmetrical. No boney deformities LAD: No inguinal or umbilical LAD Extremities:  No clubbing or edema. Neurologic:  Alert and  oriented x4;  grossly nonfocal Skin:  Intact without significant lesions or rashes. Psych:  Alert and cooperative. Normal mood and affect.    Nasif Bos L. Tarri Glenn, MD, MPH 08/06/2020, 10:08 AM

## 2020-08-06 NOTE — Progress Notes (Signed)
Spoke with Dr. Tarri Glenn re: Stool of GI pathogen panel, fecal calprotectin, elastace. Per Dr. Tarri Glenn, Dr. Derrel Nip ordered these labs. No need to order but rather for pt to go and complete at her earliest convenience. Lab orders not placed today.

## 2020-08-12 ENCOUNTER — Encounter: Payer: Self-pay | Admitting: Family

## 2020-08-12 ENCOUNTER — Other Ambulatory Visit: Payer: Self-pay

## 2020-08-12 ENCOUNTER — Ambulatory Visit: Payer: 59 | Admitting: Family

## 2020-08-12 VITALS — BP 110/66 | HR 83 | Ht 66.0 in | Wt 213.0 lb

## 2020-08-12 DIAGNOSIS — I10 Essential (primary) hypertension: Secondary | ICD-10-CM

## 2020-08-12 DIAGNOSIS — R002 Palpitations: Secondary | ICD-10-CM

## 2020-08-12 NOTE — Progress Notes (Signed)
Office Visit    Patient Name: [name] Date of Encounter: 08/12/2020  PCP:  Crecencio Mc, MD   [facility]  Cardiologist:  Nelva Bush, MD   Chief Complaint    [name] is a 50 y.o. female with a hx of PSVT, hypertension, DM2, ONGEX-52 84/1324 complicated by fatigue and uveitis, asthma, anxiety, multiple allergies presents today for follow up of hypertension and palpitations.    Past Medical History    Past Medical History:  Diagnosis Date  . Allergy   . Anemia   . Anxiety    claustrophobic  . Asthma   . Back pain   . Biceps tendonosis of right shoulder   . Cataract    Mixed OU  . Diabetes mellitus 2011   did not start metforfin, losing weight  . Dyspnea   . Fatty liver   . Food allergy   . Headache disorder   . History of concussion   . HLD (hyperlipidemia)   . Hypertension   . Hypertensive retinopathy    OU  . IBS (irritable bowel syndrome)   . Infertility, female   . Joint pain   . Lactose intolerance   . Leg edema   . Migraines   . Palpitation   . Post-menopausal   . Seborrheic dermatitis   . Shoulder impingement syndrome, right   . Vitamin D deficiency    Past Surgical History:  Procedure Laterality Date  . ABDOMINAL HYSTERECTOMY  2006   heavy menses, endometriosis, l oophrectomy  . BACK SURGERY    . BREAST BIOPSY Right 2018   benign  . BREAST EXCISIONAL BIOPSY Right 2003   benign  . BREAST EXCISIONAL BIOPSY Right 1999   benign  . BREAST SURGERY     right breast x 2 , benign  . HERNIA REPAIR  2003   left inguinal   . LEFT OOPHORECTOMY    . SHOULDER ARTHROSCOPY WITH SUBACROMIAL DECOMPRESSION AND OPEN ROTATOR C Right 07/14/2016   Procedure: RIGHT SHOULDER ARTHROSCOPY WITH SUBACROMIAL DECOMPRESSION, DISTAL CLAVICLE RESECTION AND MINI OPEN ROTATOR CUFF REPAIR, OPEN BICEP TENDODESIS;  Surgeon: Garald Balding, MD;  Location: [facility];  Service: Orthopedics;  Laterality: Right;  .  SHOULDER CLOSED REDUCTION Right 09/08/2016   Procedure: RIGHT CLOSED MANIPULATION SHOULDER;  Surgeon: Garald Balding, MD;  Location: [facility];  Service: Orthopedics;  Laterality: Right;  . TUBAL LIGATION      Allergies  Allergies  Allergen Reactions  . Bamlanivimab Anaphylaxis, Itching, Palpitations, Rash, Shortness Of Breath and Swelling  . Botox [Onabotulinumtoxina] Shortness Of Breath    syncope  . Clindamycin/Lincomycin Other (See Comments)  . Kiwi Extract Shortness Of Breath  . Maxalt [Rizatriptan Benzoate] Anaphylaxis    Chest pain  . Nitrous Oxide Shortness Of Breath    syncope  . Triptans Shortness Of Breath  . Aspirin Nausea And Vomiting    Mouth blisters  . Contrast Media [Iodinated Diagnostic Agents]   . Flagyl [Metronidazole]   . Hydrocodone-Acetaminophen Other (See Comments)  . Latex     Sometimes causes rash  . Mango Flavor   . Rizatriptan Other (See Comments)  . Vicodin [Hydrocodone-Acetaminophen]     hallucinations  . Influenza Virus Vaccine Palpitations  . Penicillins Nausea And Vomiting, Rash and Other (See Comments)    Did it involve swelling of the face/tongue/throat, SOB, or low BP? No Did it involve sudden or severe rash/hives, skin peeling, or any reaction  on the inside of your mouth or nose? No Did you need to seek medical attention at a hospital or doctor's office? Yes When did it last happen?patient was 50 years old If all above answers are "NO", may proceed with cephalosporin use.  . Tetracyclines & Related Nausea And Vomiting and Rash    History of Present Illness    [name] is a 50 y.o. female with a hx of PSVT, palpitations, hypertension, palpitations, DM2, [id number]-19 14/7829 complicated by fatigue and uveitis, asthma, anxiety, multiple allergies. She was last seen 09/18/19 by Dr. Saunders Revel.  She was seen January 2021 noting fatigue and shortness of breath.  Subsequent echo with normal LVEF and grade 1 diastolic  dysfunction with mild LA enlargement.  She was seen in follow-up April 2021 recuperating from COVID-19 infection still with ureitis.  She was doing overall well from cardiac perspective.  Her palpitations were well controlled on diltiazem and metoprolol.  She sent a MyChart message 6 with 1/22 noting that she was not on diltiazem and wondered whether it needed to be refilled.  Blood pressure was running 138 over 80s.  Presents today for follow-up. Reports no shortness of breath nor dyspnea on exertion. Reports no chest pain, pressure, or tightness. No edema, orthopnea, PND. Reports no palpitations. 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 Her daughter also does competitive cheerleading which leads to a very busy schedule.  She has not been taking her diltiazem and is unsure when it was stopped.  On review was last prescribed December 2021 for 6 months supplied by her primary care provider.  Anticipate she has been off of this medication for 2 months.  Reports no recurrent palpitations.  Her blood pressure has been checked at recent clinic visits with readings 120/80, 132/77 and 110/66 today.  She has not been checking at home but does have a capability to do so.  EKGs/Labs/Other Studies Reviewed:   The following studies were reviewed today:  EP Procedures and Devices:  14-day event monitor (01/12/17): Predominantly sinus rhythm with rare PACs and PVCs. Brief atrial run lasting 4 beats.   Non-Invasive Evaluation(s):  TTE (06/18/2019): Normal LV size with mild LVH.  LVEF 60-65% with grade 1 diastolic dysfunction.  Normal RV size and function.  Mild left atrial enlargement.  Coronary CTA (03/23/17): No evidence of coronary artery disease. Normal coronary origins with right dominance. Coronary artery calcium score is zero.  TTE  (01/19/17): Normal LV size with moderate LVH. LVEF 60-65% with grade 1 diastolic dysfunction. Normal RV size and function. Trivial pericardial effusion.   EKG:  EKG is ordered today.  The ekg ordered today demonstrates normal sinus rhythm 83 bpm with no acute ST/T wave changes.  Recent Labs: 07/20/2020: ALT 24; BUN 9; Creatinine, Ser 0.77; Hemoglobin 12.5; Platelets 210; Potassium 3.5; Sodium 140  Recent Lipid Panel    Component Value Date/Time   CHOL 136 04/23/2018 0932   TRIG 71 04/23/2018 0932   HDL 39 (L) 04/23/2018 0932   CHOLHDL 3.5 04/23/2018 0932   CHOLHDL 4 03/22/2017 1026   VLDL 22.2 03/22/2017 1026   LDLCALC 83 04/23/2018 0932   LDLDIRECT 98.0 08/17/2016 1555    Home Medications   Current Meds  Medication Sig  . albuterol (VENTOLIN HFA) 108 (90 Base) MCG/ACT inhaler Inhale 2 puffs into the lungs  every 6 (six) hours as needed for wheezing or shortness of breath.  . ALPRAZolam (XANAX) 0.5 MG tablet Take 1 tablet (0.5 mg total) by mouth 3 (three) times daily as needed (Headache).  [name] aspirin EC 81 MG tablet Take 81 mg by mouth daily.  [name] atorvastatin (LIPITOR) 20 MG tablet TAKE 1 TABLET BY MOUTH DAILY.  [name] Blood Glucose Monitoring Suppl (FREESTYLE LITE) DEVI 1 each by Does not apply route 2 (two) times daily. E11.9  . Bromfenac Sodium (PROLENSA) 0.07 % SOLN Place 1 drop into both eyes 4 (four) times daily.  . butalbital-acetaminophen-caffeine (FIORICET) 50-325-40 MG tablet Take 1 tablet by mouth every 6 (six) hours as needed for headache. Do not refill in less than 30 day  . diazepam (VALIUM) 2 MG tablet Take 1 tablet (2 mg total) by mouth every 6 (six) hours as needed (Headache).  . dicyclomine (BENTYL) 20 MG tablet Take 1 tablet (20 mg total) by mouth every 6 (six) hours.  [name] EPINEPHrine 0.3 mg/0.3 mL IJ SOAJ injection Inject 0.3 mLs (0.3 mg total) into the muscle as needed (for allergic reaction).  . Fremanezumab-vfrm (AJOVY) 225 MG/1.5ML SOSY Inject 4.5 mLs into the skin every  3 (three) months. Dispense 3 syringes  . glucose blood (FREESTYLE LITE) test strip 1 each by Other route 2 (two) times daily. E11.9  . insulin degludec (TRESIBA FLEXTOUCH) 100 UNIT/ML FlexTouch Pen Inject 10 Units into the skin daily.  . insulin lispro (HUMALOG) 100 UNIT/ML injection Inject 20 Units into the skin daily.  . Insulin Pen Needle (PEN NEEDLES 31GX5/16") 31G X 8 MM MISC 1 each by Does not apply route in the morning, at noon, in the evening, and at bedtime. E11.9  . Lancets (FREESTYLE) lancets 1 each by Other route 2 (two) times daily. E11.9  . lisdexamfetamine (VYVANSE) 50 MG capsule 1 capsule in the morning  . losartan-hydrochlorothiazide (HYZAAR) 50-12.5 MG tablet Take 1 tablet by mouth daily.  . metoprolol succinate (TOPROL-XL) 25 MG 24 hr tablet TAKE 1/2 TABLET BY MOUTH DAILY.  [name] ondansetron (ZOFRAN ODT) 4 MG disintegrating tablet Take 1 tablet (4 mg total) by mouth every 8 (eight) hours as needed for nausea or vomiting.  [name] oxyCODONE-acetaminophen (PERCOCET/ROXICET) 5-325 MG tablet Take by mouth as needed for severe pain.  . Semaglutide, 1 MG/DOSE, (OZEMPIC, 1 MG/DOSE,) 2 MG/1.5ML SOPN Inject 0.75 mLs (1 mg total) into the skin once a week.     Review of Systems   All other systems reviewed and are otherwise negative except as noted above.  Physical Exam    VS:  BP 110/66 (BP Location: Left Arm, Patient Position: Sitting, Cuff Size: Normal)   Pulse 83   Ht 5\' 6"  (1.676 m)   Wt 213 lb (96.6 kg)   SpO2 98%   BMI 34.38 kg/m  , BMI Body mass index is 34.38 kg/m.  Wt Readings from Last 3 Encounters:  08/12/20 213 lb (96.6 kg)  08/06/20 216 lb 6.4 oz (98.2 kg)  07/28/20 212 lb 9.6 oz (96.4 kg)    GEN: Well nourished, overweight, well developed, in no acute distress. HEENT: normal. Neck: Supple, no JVD  or masses. Cardiac: RRR, no murmurs, rubs, or gallops. No clubbing, cyanosis, edema.  Radials 2+ and equal bilaterally.  Respiratory:  Respirations regular and unlabored,  clear to auscultation bilaterally. GI: Soft, nontender, nondistended. MS: No deformity or atrophy. Skin: Warm and dry, no rash. Neuro:  Strength and sensation are intact. Psych: Normal affect.  Assessment &  Plan    1. Palpitations - Reports no recurrence.  She has been taking Toprol 12.5 mg daily which we will continue.  EKG today shows normal sinus rhythm with no arrhythmia.  If palpitations increase in frequency or severity could consider increased dose of Toprol vs low dose extended release Diltiazem.  2. Hypertension- BP well controlled. Continue current antihypertensive regimen including Toprol 12.5 mg daily, losartan-HCTZ 50-12.5 mg daily.  She has been off diltiazem for approx 2 months and will not resume as her BP is well controlled and no recurrent palpitations. Recommended she check her blood pressure 3 times per week and keep a log for the next 2 weeks. If BP above goal of 130/80, consider increased dose of Losartan-HCTZ.  3. DM2 -07/28/2020 A1c 6.1.  Congratulated her on  continue to follow with endocrinology.  Disposition: Follow up in 1 year(s) with Dr. Saunders Revel or APP  Signed, Loel Dubonnet, NP 08/12/2020, 10:06 AM [place]

## 2020-08-12 NOTE — Patient Instructions (Addendum)
Medication Instructions:  Continue your current medications.   *If you need a refill on your cardiac medications before your next appointment, please call your pharmacy*   Lab Work: None ordered today  Testing/Procedures: Your EKG today shows normal sinus rhythm.   Follow-Up: At [facility], you and your health needs are our priority.  As part of our continuing mission to provide you with exceptional heart care, we have created designated Provider Care Teams.  These Care Teams include your primary Cardiologist (physician) and Advanced Practice Providers (APPs -  Physician Assistants and Nurse Practitioners) who all work together to provide you with the care you need, when you need it.  We recommend signing up for the patient portal called "MyChart".  Sign up information is provided on this After Visit Summary.  MyChart is used to connect with patients for Virtual Visits (Telemedicine).  Patients are able to view lab/test results, encounter notes, upcoming appointments, etc.  Non-urgent messages can be sent to your provider as well.   To learn more about what you can do with MyChart, go to [email or web address].    Your next appointment:   1 year(s)  The format for your next appointment:   In Person  Provider:   You may see Nelva Bush, MD or one of the following Advanced Practice Providers on your designated Care Team:    Murray Hodgkins, NP  Christell Faith, PA-C  Marrianne Mood, PA-C  Cadence Kathlen Mody, [place]  Laurann Montana, NP  Other Instructions  Check your blood pressure at least 3 times per day and keep a log.  Loel Dubonnet, NP will send you a MyChart message in 2 weeks to check in on your blood pressure.  Heart Healthy Diet Recommendations: A low-salt diet is recommended. Meats should be grilled, baked, or boiled. Avoid fried foods. Focus on lean protein sources like fish or chicken with vegetables and fruits. The American Heart Association is a Microbiologist!   American Heart Association Diet and Lifeystyle Recommendations   Exercise recommendations: The American Heart Association recommends 150 minutes of moderate intensity exercise weekly. Try 30 minutes of moderate intensity exercise 4-5 times per week. This could include walking, jogging, or swimming.  Tips to Measure your Blood Pressure Correctly  National and international guidelines offer specific instructions for measuring blood pressure. If a doctor, nurse, or medical assistant isn't doing it right, don't hesitate to ask him or her to get with the guidelines.  Here's what you can do to ensure a correct reading: . Don't drink a caffeinated beverage or smoke during the 30 minutes before the test. . Sit quietly for five minutes before the test begins. . During the measurement, sit in a chair with your feet on the floor and your arm supported so your elbow is at about heart level. . The inflatable part of the cuff should completely cover at least 80% of your upper arm, and the cuff should be placed on bare skin, not over a shirt. . Don't talk during the measurement. . Have your blood pressure measured twice, with a brief break in between. If the readings are different by 5 points or more, have it done a third time.  There are times to break these rules. If you sometimes feel lightheaded when getting out of bed in the morning or when you stand after sitting, you should have your blood pressure checked while seated and then while standing to see if it falls from one position to the next.  In 2017,  new guidelines from the [place], the [employer] of Cardiology, and nine other health organizations lowered the diagnosis of high blood pressure to 130/80 mm Hg or higher for all adults. The guidelines also redefined the various blood pressure categories to now include normal, elevated, Stage 1 hypertension, Stage 2 hypertension, and hypertensive crisis (see "Blood pressure  categories").  Blood pressure categories  Blood pressure category SYSTOLIC (upper number)  DIASTOLIC (lower number)  Normal Less than 120 mm Hg and Less than 80 mm Hg  Elevated 120-129 mm Hg and Less than 80 mm Hg  High blood pressure: Stage 1 hypertension 130-139 mm Hg or 80-89 mm Hg  High blood pressure: Stage 2 hypertension 140 mm Hg or higher or 90 mm Hg or higher  Hypertensive crisis (consult your doctor immediately) Higher than 180 mm Hg and/or Higher than 120 mm Hg  Source: American Heart Association and American Stroke Association. For more on getting your blood pressure under control, buy Controlling Your Blood Pressure, a Special Health Report from [facility].   Blood Pressure Log   Date   Time  Blood Pressure  Position  Example: Nov 1 9 AM 124/78 sitting

## 2020-08-17 DIAGNOSIS — H25013 Cortical age-related cataract, bilateral: Secondary | ICD-10-CM | POA: Diagnosis not present

## 2020-08-17 DIAGNOSIS — E119 Type 2 diabetes mellitus without complications: Secondary | ICD-10-CM | POA: Diagnosis not present

## 2020-08-17 DIAGNOSIS — H35033 Hypertensive retinopathy, bilateral: Secondary | ICD-10-CM | POA: Diagnosis not present

## 2020-08-17 DIAGNOSIS — H2513 Age-related nuclear cataract, bilateral: Secondary | ICD-10-CM | POA: Diagnosis not present

## 2020-08-17 LAB — HM DIABETES EYE EXAM

## 2020-08-26 ENCOUNTER — Encounter: Payer: Self-pay | Admitting: Family

## 2020-09-02 ENCOUNTER — Telehealth: Payer: Self-pay | Admitting: Family

## 2020-09-02 NOTE — Telephone Encounter (Signed)
Called to check on BP after clinic visit earlier this month. Tells me she has not been checking at home. 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 Agreeable to check at home and contact our office if routinely >130/80.   Loel Dubonnet, NP

## 2020-09-03 MED FILL — METOPROLOL SUCCINATE ER 25: 25 | 90 days supply | Qty: 45 | Fill #2

## 2020-09-04 MED FILL — LOSARTAN-HCTZ 50-12.5 MG TA: 50-12.5 | 30 days supply | Qty: 30 | Fill #3

## 2020-09-07 ENCOUNTER — Other Ambulatory Visit (HOSPITAL_COMMUNITY): Payer: Self-pay

## 2020-09-14 ENCOUNTER — Other Ambulatory Visit (HOSPITAL_COMMUNITY): Payer: Self-pay

## 2020-09-14 MED FILL — Semaglutide Soln Pen-inj 1 MG/DOSE (4 MG/3ML): SUBCUTANEOUS | 84 days supply | Qty: 9 | Fill #0 | Status: AC

## 2020-09-15 ENCOUNTER — Other Ambulatory Visit: Payer: Self-pay

## 2020-09-16 ENCOUNTER — Other Ambulatory Visit: Payer: Self-pay | Admitting: Endocrinology

## 2020-09-16 ENCOUNTER — Other Ambulatory Visit (HOSPITAL_COMMUNITY): Payer: Self-pay

## 2020-09-16 DIAGNOSIS — E1165 Type 2 diabetes mellitus with hyperglycemia: Secondary | ICD-10-CM

## 2020-09-16 MED ORDER — INSULIN PEN NEEDLE 31G X 8 MM MISC
1.0000 | Freq: Two times a day (BID) | 3 refills | Status: DC
  Filled 2020-09-16: qty 100, 50d supply, fill #0
  Filled 2021-03-23: qty 100, 50d supply, fill #1

## 2020-09-16 MED FILL — Fremanezumab-vfrm Subcutaneous Soln Pref Syr 225 MG/1.5ML: SUBCUTANEOUS | 90 days supply | Qty: 4.5 | Fill #0 | Status: CN

## 2020-09-17 ENCOUNTER — Telehealth: Payer: Self-pay | Admitting: *Deleted

## 2020-09-17 ENCOUNTER — Other Ambulatory Visit (HOSPITAL_COMMUNITY): Payer: Self-pay

## 2020-09-17 NOTE — Telephone Encounter (Signed)
Pt called stating she was having a migraine that is not going away with the multiple medications. Pt is wanting to know what can she do at this point. Informed pt that I will reach out to Gustavo Lah and will call patient back.   After speaking with Santiago Glad I reached back out to pt with her options. Pt can take the sample of Ajovy she has and then continue her next does as scheduled, or she can come into the office for Toradol injection and if wanted pt can try prednisone taper if other options do not work. Pt will try the Ajovy to see if that helps.

## 2020-09-18 ENCOUNTER — Other Ambulatory Visit (HOSPITAL_COMMUNITY): Payer: Self-pay

## 2020-09-22 ENCOUNTER — Other Ambulatory Visit (HOSPITAL_COMMUNITY): Payer: Self-pay

## 2020-09-22 MED FILL — Dicyclomine HCl Tab 20 MG: ORAL | 23 days supply | Qty: 90 | Fill #0 | Status: AC

## 2020-09-23 ENCOUNTER — Other Ambulatory Visit (HOSPITAL_COMMUNITY): Payer: Self-pay

## 2020-09-25 ENCOUNTER — Encounter: Payer: Self-pay | Admitting: Radiology

## 2020-10-06 ENCOUNTER — Ambulatory Visit (AMBULATORY_SURGERY_CENTER): Payer: 59 | Admitting: Gastroenterology

## 2020-10-06 ENCOUNTER — Other Ambulatory Visit (HOSPITAL_COMMUNITY): Payer: Self-pay

## 2020-10-06 ENCOUNTER — Other Ambulatory Visit: Payer: Self-pay

## 2020-10-06 ENCOUNTER — Encounter: Payer: Self-pay | Admitting: Gastroenterology

## 2020-10-06 VITALS — BP 114/77 | HR 84 | Temp 98.0°F | Resp 14 | Ht 66.0 in | Wt 216.0 lb

## 2020-10-06 DIAGNOSIS — K635 Polyp of colon: Secondary | ICD-10-CM | POA: Diagnosis not present

## 2020-10-06 DIAGNOSIS — R194 Change in bowel habit: Secondary | ICD-10-CM | POA: Diagnosis not present

## 2020-10-06 DIAGNOSIS — K219 Gastro-esophageal reflux disease without esophagitis: Secondary | ICD-10-CM | POA: Diagnosis not present

## 2020-10-06 DIAGNOSIS — K2100 Gastro-esophageal reflux disease with esophagitis, without bleeding: Secondary | ICD-10-CM | POA: Diagnosis not present

## 2020-10-06 DIAGNOSIS — K2970 Gastritis, unspecified, without bleeding: Secondary | ICD-10-CM

## 2020-10-06 DIAGNOSIS — K2980 Duodenitis without bleeding: Secondary | ICD-10-CM

## 2020-10-06 DIAGNOSIS — R1013 Epigastric pain: Secondary | ICD-10-CM

## 2020-10-06 DIAGNOSIS — K319 Disease of stomach and duodenum, unspecified: Secondary | ICD-10-CM | POA: Diagnosis not present

## 2020-10-06 DIAGNOSIS — D124 Benign neoplasm of descending colon: Secondary | ICD-10-CM

## 2020-10-06 HISTORY — PX: COLONOSCOPY: SHX174

## 2020-10-06 HISTORY — PX: UPPER GASTROINTESTINAL ENDOSCOPY: SHX188

## 2020-10-06 MED ORDER — PANTOPRAZOLE SODIUM 40 MG PO TBEC
40.0000 mg | DELAYED_RELEASE_TABLET | Freq: Every day | ORAL | 3 refills | Status: DC
  Filled 2020-10-06: qty 90, 90d supply, fill #0

## 2020-10-06 MED ORDER — SODIUM CHLORIDE 0.9 % IV SOLN: 500.0000 mL | Freq: Once | INTRAVENOUS | Status: DC

## 2020-10-06 NOTE — Progress Notes (Signed)
Pt's states no medical or surgical changes since previsit or office visit.  Vitals [place] 

## 2020-10-06 NOTE — Progress Notes (Signed)
pt tolerated well. VSS. awake and to recovery. Report given to RN. Bite block left insitu to recovery. 

## 2020-10-06 NOTE — Op Note (Addendum)
[facility] Patient Name: [name] Procedure Date: 10/06/2020 2:20 PM MRN: [id number] Endoscopist: Thornton Park MD, MD Age: 50 Referring MD:  Date of Birth: 1971/02/10 Gender: Female Account #: [id number] Procedure:                Upper GI endoscopy Indications:              Epigastric abdominal pain not explained by                            ultrasound or CT, Diarrhea Medicines:                Monitored Anesthesia Care Procedure:                Pre-Anesthesia Assessment:                           - Prior to the procedure, a History and Physical                            was performed, and patient medications and                            allergies were reviewed. The patient's tolerance of                            previous anesthesia was also reviewed. The risks                            and benefits of the procedure and the sedation                            options and risks were discussed with the patient.                            All questions were answered, and informed consent                            was obtained. Prior Anticoagulants: The patient has                            taken no previous anticoagulant or antiplatelet                            agents. ASA Grade Assessment: III - A patient with                            severe systemic disease. After reviewing the risks                            and benefits, the patient was deemed in                            satisfactory condition to undergo the procedure.  After obtaining informed consent, the endoscope was                            passed under direct vision. Throughout the                            procedure, the patient's blood pressure, pulse, and                            oxygen saturations were monitored continuously. The                            Endoscope was introduced through the mouth, and                            advanced to the third part of  duodenum. The upper                            GI endoscopy was accomplished without difficulty.                            The patient tolerated the procedure well. Scope In: Scope Out: Findings:                 LA Grade A (one or more mucosal breaks less than 5                            mm, not extending between tops of 2 mucosal folds)                            esophagitis with no bleeding was found 38 cm from                            the incisors. Biopsies were taken from the distal                            esophagus with a cold forceps for histology.                            Estimated blood loss was minimal.                           There was a large amount of liquid in the stomach                            at the start of the procedure, although the patient                            reported not having anything to drink in over 4                            hours. Two pills were also present. The entire  examined stomach was normal. Biopsies were taken                            from the antrum, body, and fundus with a cold                            forceps for histology. Estimated blood loss was                            minimal.                           The examined duodenum was normal. Biopsies were                            taken with a cold forceps for histology. Estimated                            blood loss was minimal.                           The cardia and gastric fundus were normal on                            retroflexion.                           The exam was otherwise without abnormality. Complications:            No immediate complications. Estimated blood loss:                            Minimal. Estimated Blood Loss:     Estimated blood loss was minimal. Impression:               - LA Grade A reflux esophagitis with no bleeding.                            Biopsied.                           - Large amount of liquid in  the stomach.                           - Normal stomach. Biopsied.                           - Normal examined duodenum. Biopsied.                           - The examination was otherwise normal. Recommendation:           - Patient has a contact number available for                            emergencies. The signs and symptoms of potential  delayed complications were discussed with the                            patient. Return to normal activities tomorrow.                            Written discharge instructions were provided to the                            patient.                           - Resume previous diet.                           - Continue present medications.                           - Start pantoprazole 40 mg QAM.                           - Await pathology results.                           - Proceed with colonoscopy as previously planned. Thornton Park MD, MD 10/06/2020 2:36:02 PM This report has been signed electronically.

## 2020-10-06 NOTE — Progress Notes (Signed)
Called to room to assist during endoscopic procedure.  Patient ID and intended procedure confirmed with present staff. Received instructions for my participation in the procedure from the performing physician.  

## 2020-10-06 NOTE — Patient Instructions (Addendum)
Start pantoprazole 40mg  each morning Continue current medications Resume previous diet Await pathology results Scheduled next colonoscopy  YOU HAD AN ENDOSCOPIC PROCEDURE TODAY AT [facility]:   Refer to the procedure report that was given to you for any specific questions about what was found during the examination.  If the procedure report does not answer your questions, please call your gastroenterologist to clarify.  If you requested that your care partner not be given the details of your procedure findings, then the procedure report has been included in a sealed envelope for you to review at your convenience later.  YOU SHOULD EXPECT: Some feelings of bloating in the abdomen. Passage of more gas than usual.  Walking can help get rid of the air that was put into your GI tract during the procedure and reduce the bloating. If you had a lower endoscopy (such as a colonoscopy or flexible sigmoidoscopy) you may notice spotting of blood in your stool or on the toilet paper. If you underwent a bowel prep for your procedure, you may not have a normal bowel movement for a few days.  Please Note:  You might notice some irritation and congestion in your nose or some drainage.  This is from the oxygen used during your procedure.  There is no need for concern and it should clear up in a day or so.  SYMPTOMS TO REPORT IMMEDIATELY:   Following lower endoscopy (colonoscopy or flexible sigmoidoscopy):  Excessive amounts of blood in the stool  Significant tenderness or worsening of abdominal pains  Swelling of the abdomen that is new, acute  Fever of 100F or higher   Following upper endoscopy (EGD)  Vomiting of blood or coffee ground material  New chest pain or pain under the shoulder blades  Painful or persistently difficult swallowing  New shortness of breath  Fever of 100F or higher  Black, tarry-looking stools  For urgent or emergent issues, a gastroenterologist can be reached  at any hour by calling [phone]. Do not use MyChart messaging for urgent concerns.    DIET:  We do recommend a small meal at first, but then you may proceed to your regular diet.  Drink plenty of fluids but you should avoid alcoholic beverages for 24 hours.  ACTIVITY:  You should plan to take it easy for the rest of today and you should NOT DRIVE or use heavy machinery until tomorrow (because of the sedation medicines used during the test).    FOLLOW UP: Our staff will call the number listed on your records 48-72 hours following your procedure to check on you and address any questions or concerns that you may have regarding the information given to you following your procedure. If we do not reach you, we will leave a message.  We will attempt to reach you two times.  During this call, we will ask if you have developed any symptoms of COVID 19. If you develop any symptoms (ie: fever, flu-like symptoms, shortness of breath, cough etc.) before then, please call [phone].  If you test positive for Covid 19 in the 2 weeks post procedure, please call and report this information to [place].    If any biopsies were taken you will be contacted by phone or by letter within the next 1-3 weeks.  Please call us at [phone] if you have not heard about the biopsies in 3 weeks.    SIGNATURES/CONFIDENTIALITY: You and/or your care partner have signed paperwork which will be entered  into your electronic medical record.  These signatures attest to the fact that that the information above on your After Visit Summary has been reviewed and is understood.  Full responsibility of the confidentiality of this discharge information lies with you and/or your care-partner.

## 2020-10-06 NOTE — Op Note (Signed)
[facility] Patient Name: [name] Procedure Date: 10/06/2020 2:19 PM MRN: [id number] Endoscopist: Thornton Park MD, MD Age: 50 Referring MD:  Date of Birth: [birth year] Gender: Female Account #: [id number] Procedure:                Colonoscopy Indications:              Epigastric abdominal pain, Change in bowel habits Medicines:                Monitored Anesthesia Care Procedure:                Pre-Anesthesia Assessment:                           - Prior to the procedure, a History and Physical                            was performed, and patient medications and                            allergies were reviewed. The patient's tolerance of                            previous anesthesia was also reviewed. The risks                            and benefits of the procedure and the sedation                            options and risks were discussed with the patient.                            All questions were answered, and informed consent                            was obtained. Prior Anticoagulants: The patient has                            taken no previous anticoagulant or antiplatelet                            agents. ASA Grade Assessment: III - A patient with                            severe systemic disease. After reviewing the risks                            and benefits, the patient was deemed in                            satisfactory condition to undergo the procedure.                           After obtaining informed consent, the colonoscope  was passed under direct vision. Throughout the                            procedure, the patient's blood pressure, pulse, and                            oxygen saturations were monitored continuously. The                            Olympus CF-HQ190 [id number]) Colonoscope was                            introduced through the anus and advanced to the the                            cecum,  identified by appendiceal orifice and                            ileocecal valve. The colonoscopy was unusually                            difficult due to inadequate bowel prep. The patient                            tolerated the procedure well. The quality of the                            bowel preparation was inadequate. The ileocecal                            valve, appendiceal orifice, and rectum were                            photographed. Scope In: 2:37:35 PM Scope Out: 2:51:11 PM Scope Withdrawal Time: 0 hours 8 minutes 39 seconds  Total Procedure Duration: 0 hours 13 minutes 36 seconds  Findings:                 The perianal and digital rectal examinations were                            normal.                           A moderate amount of liquid semi-liquid semi-solid                            solid stool was found in the entire colon,                            interfering with visualization.                           A 3 mm polyp was found in the descending colon. The  polyp was sessile. The polyp was removed with a                            cold snare. Resection and retrieval were complete.                            Estimated blood loss was minimal.                           The colon (entire examined portion) appeared                            normal. Biopsies were taken from the righ and left                            colon with a cold forceps for histology. Estimated                            blood loss was minimal.                           The exam was otherwise without abnormality on                            direct and retroflexion views. Complications:            No immediate complications. Estimated blood loss:                            Minimal. Estimated Blood Loss:     Estimated blood loss was minimal. Impression:               - Preparation of the colon was inadequate. The prep                            was not adequate  to evaluate for colon polyps.                           - Stool in the entire examined colon.                           - No obvious explaination for epgiastric pain or                            change in bowel habits. Random biopsies obtained.                           - One 3 mm polyp in the descending colon, removed                            with a cold snare. Resected and retrieved. Recommendation:           - Patient has a contact number available for  emergencies. The signs and symptoms of potential                            delayed complications were discussed with the                            patient. Return to normal activities tomorrow.                            Written discharge instructions were provided to the                            patient.                           - Resume previous diet.                           - Continue present medications.                           - Await pathology results.                           - Repeat colonoscopy at the next available                            appointment because the bowel preparation was                            suboptimal. Use a two day bowel prep at that time. Thornton Park MD, MD 10/06/2020 2:57:48 PM This report has been signed electronically.

## 2020-10-08 ENCOUNTER — Telehealth: Payer: Self-pay | Admitting: *Deleted

## 2020-10-08 NOTE — Telephone Encounter (Signed)
Follow up call made. 

## 2020-10-12 ENCOUNTER — Other Ambulatory Visit: Payer: Self-pay | Admitting: Internal Medicine

## 2020-10-12 ENCOUNTER — Other Ambulatory Visit (HOSPITAL_COMMUNITY): Payer: Self-pay

## 2020-10-13 ENCOUNTER — Other Ambulatory Visit (HOSPITAL_COMMUNITY): Payer: Self-pay

## 2020-10-13 MED ORDER — LOSARTAN POTASSIUM-HCTZ 50-12.5 MG PO TABS
1.0000 | ORAL_TABLET | Freq: Every day | ORAL | 1 refills | Status: DC
  Filled 2020-10-13: qty 90, 90d supply, fill #0
  Filled 2021-02-12: qty 90, 90d supply, fill #1

## 2020-10-14 ENCOUNTER — Other Ambulatory Visit (HOSPITAL_COMMUNITY): Payer: Self-pay

## 2020-10-21 ENCOUNTER — Encounter: Payer: Self-pay | Admitting: Gastroenterology

## 2020-10-26 ENCOUNTER — Other Ambulatory Visit (HOSPITAL_COMMUNITY): Payer: Self-pay

## 2020-10-26 ENCOUNTER — Other Ambulatory Visit: Payer: Self-pay

## 2020-10-26 ENCOUNTER — Ambulatory Visit: Payer: 59 | Admitting: Endocrinology

## 2020-10-26 VITALS — BP 114/78 | HR 90 | Ht 66.0 in | Wt 218.0 lb

## 2020-10-26 DIAGNOSIS — E1165 Type 2 diabetes mellitus with hyperglycemia: Secondary | ICD-10-CM | POA: Diagnosis not present

## 2020-10-26 LAB — POCT GLYCOSYLATED HEMOGLOBIN (HGB A1C): Hemoglobin A1C: 6.9 % — AB (ref 4.0–5.6)

## 2020-10-26 MED ORDER — TRESIBA FLEXTOUCH 100 UNIT/ML ~~LOC~~ SOPN: 14.0000 [IU] | PEN_INJECTOR | Freq: Every day | SUBCUTANEOUS | 3 refills | Status: DC

## 2020-10-26 MED ORDER — FREESTYLE LIBRE 2 SENSOR MISC
1.0000 | 3 refills | Status: DC
  Filled 2020-10-26 – 2020-11-05 (×2): qty 6, 84d supply, fill #0
  Filled 2020-11-17: qty 6, 90d supply, fill #0
  Filled 2020-11-20: qty 2, 28d supply, fill #0
  Filled 2020-12-15: qty 2, 28d supply, fill #1

## 2020-10-26 MED ORDER — METFORMIN HCL ER 500 MG PO TB24: 500.0000 mg | ORAL_TABLET | Freq: Every day | ORAL | 3 refills | Status: DC

## 2020-10-26 MED ORDER — FREESTYLE LIBRE 2 READER DEVI
1.0000 | 1 refills | Status: DC
  Filled 2020-10-26 – 2020-11-05 (×2): qty 1, 1d supply, fill #0

## 2020-10-26 NOTE — Progress Notes (Signed)
Subjective:    Patient ID: [name], female    DOB: 05/24/1971, 50 y.o.   MRN: [id number]  HPI Pt returns for f/u of diabetes mellitus: DM type: Insulin-requiring type 2 Dx'ed: 0947 Complications: DN Therapy: insulin since 2018, Ozempic, and metformin.   GDM: 1990 and 2004 DKA: never Severe hypoglycemia: never Pancreatitis: never Pancreatic imaging: normal on 2019 CT.  SDOH: none Other: She did not tolerate Jardiance (vaginitis), metformin (diarrhea), or Victoza (dysphagia); she takes multiple daily injections (but she takes humalog BID, as she eats 2 meals per day); she works 1st shift.   Interval history: no recent steroids.  Pt says cbg's vary from 100-180.  It is in general highest fasting (higher than at HS).  She resumed the metformin 500-BID, due to poor diet.  Past Medical History:  Diagnosis Date  . Allergy   . Anemia   . Anxiety    claustrophobic  . Asthma   . Back pain   . Biceps tendonosis of right shoulder   . Cataract    Mixed OU  . COVID-19 2020   covid PNA  . Diabetes mellitus 2011   did not start metforfin, losing weight  . Dyspnea   . Fatty liver   . Food allergy   . Headache disorder   . History of concussion   . HLD (hyperlipidemia)   . Hypertension   . Hypertensive retinopathy    OU  . IBS (irritable bowel syndrome)   . Infertility, female   . Joint pain   . Lactose intolerance   . Leg edema   . Migraines   . Palpitation   . Post-menopausal   . Seborrheic dermatitis   . Shoulder impingement syndrome, right   . Vitamin D deficiency     Past Surgical History:  Procedure Laterality Date  . ABDOMINAL HYSTERECTOMY  2006   heavy menses, endometriosis, l oophrectomy  . BACK SURGERY    . BREAST BIOPSY Right 2018   benign  . BREAST EXCISIONAL BIOPSY Right 2003   benign  . BREAST EXCISIONAL BIOPSY Right 1999   benign  . BREAST SURGERY     right breast x 2 , benign  . COLONOSCOPY  10/06/2020  . HERNIA REPAIR  2003   left  inguinal   . LEFT OOPHORECTOMY    . SHOULDER ARTHROSCOPY WITH SUBACROMIAL DECOMPRESSION AND OPEN ROTATOR C Right 07/14/2016   Procedure: RIGHT SHOULDER ARTHROSCOPY WITH SUBACROMIAL DECOMPRESSION, DISTAL CLAVICLE RESECTION AND MINI OPEN ROTATOR CUFF REPAIR, OPEN BICEP TENDODESIS;  Surgeon: Garald Balding, MD;  Location: [facility];  Service: Orthopedics;  Laterality: Right;  . SHOULDER CLOSED REDUCTION Right 09/08/2016   Procedure: RIGHT CLOSED MANIPULATION SHOULDER;  Surgeon: Garald Balding, MD;  Location: [facility];  Service: Orthopedics;  Laterality: Right;  . TUBAL LIGATION    . UPPER GASTROINTESTINAL ENDOSCOPY  10/06/2020    Social History   Socioeconomic History  . Marital status: Divorced    Spouse name: Not on file  . Number of children: 2  . Years of education: 69  . Highest education level: Master's degree (e.g., MA, MS, MEng, MEd, MSW, MBA)  Occupational History  . 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 Smoking status: Never Smoker  . Smokeless tobacco: Never Used  Vaping Use  . Vaping Use: Never used  Substance and Sexual Activity  . Alcohol use: No  . Drug use: No  .  Sexual activity: Not Currently    Partners: Male    Birth control/protection: Surgical  Other Topics Concern  . Not on file  Social History Narrative   Lives at home with son and daughter.   Right-handed.   1-3 cups caffeine weekly.   Social Determinants of Health   Financial Resource Strain: Not on file  Food Insecurity: Not on file  Transportation Needs: Not on file  Physical Activity: Not on file  Stress: Not on file  Social Connections: Not on file  Intimate Partner Violence: Not on file    Current Outpatient Medications on File Prior to Visit  Medication Sig Dispense Refill  . albuterol (VENTOLIN HFA) 108 (90 Base) MCG/ACT inhaler Inhale 2 puffs into the lungs every 6 (six) hours as needed for wheezing or shortness of breath.  18 g 0  . ALPRAZolam (XANAX) 0.5 MG tablet Take 1 tablet (0.5 mg total) by mouth 3 (three) times daily as needed (Headache).    [name] aspirin EC 81 MG tablet Take 81 mg by mouth daily.    [name] atorvastatin (LIPITOR) 20 MG tablet TAKE 1 TABLET BY MOUTH DAILY. 90 tablet 3  . Blood Glucose Monitoring Suppl (FREESTYLE LITE) DEVI 1 each by Does not apply route 2 (two) times daily. E11.9 1 each 0  . butalbital-acetaminophen-caffeine (FIORICET) 50-325-40 MG tablet Take 1 tablet by mouth every 6 (six) hours as needed for headache. Do not refill in less than 30 day 30 tablet 1  . diazepam (VALIUM) 2 MG tablet Take 1 tablet (2 mg total) by mouth every 6 (six) hours as needed (Headache). 30 tablet 5  . dicyclomine (BENTYL) 20 MG tablet Take 1 tablet (20 mg total) by mouth every 6 (six) hours. 90 tablet 1  . EPINEPHrine 0.3 mg/0.3 mL IJ SOAJ injection Inject 0.3 mLs (0.3 mg total) into the muscle as needed (for allergic reaction). 1 each 3  . Fremanezumab-vfrm (AJOVY) 225 MG/1.5ML SOSY Inject 4.5 mLs into the skin every 3 (three) months. Dispense 3 syringes 4.5 mL 4  . Fremanezumab-vfrm 225 MG/1.5ML SOSY INJECT 4.5 MLS INTO THE SKIN EVERY 3 (THREE) MONTHS 4.5 mL 4  . glucose blood (FREESTYLE LITE) test strip 1 each by Other route 2 (two) times daily. E11.9 200 each 0  . insulin lispro (HUMALOG) 100 UNIT/ML KwikPen INJECT 20 UNITS INTO THE SKIN 2 (TWO) TIMES DAILY WITH A MEAL. 66 mL 0  . Insulin Pen Needle 31G X 8 MM MISC Use as directed 2 times daily. 200 each 3  . Lancets (FREESTYLE) lancets 1 each by Other route 2 (two) times daily. E11.9 200 each 0  . losartan-hydrochlorothiazide (HYZAAR) 50-12.5 MG tablet Take 1 tablet by mouth daily. 90 tablet 1  . metoprolol succinate (TOPROL-XL) 25 MG 24 hr tablet TAKE 1/2 TABLET BY MOUTH DAILY. 45 tablet 2  . ondansetron (ZOFRAN ODT) 4 MG disintegrating tablet Take 1 tablet (4 mg total) by mouth every 8 (eight) hours as needed for nausea or vomiting. 20 tablet 0  .  OXcarbazepine ER 150 MG TB24 TAKE 1 TABLET BY MOUTH DAILY AT BEDTIME 30 tablet 5  . oxyCODONE-acetaminophen (PERCOCET/ROXICET) 5-325 MG tablet Take by mouth as needed for severe pain.    . pantoprazole (PROTONIX) 40 MG tablet Take 1 tablet (40 mg total) by mouth daily. Take only with water and take 30 minutes before eating. 90 tablet 3  . Semaglutide, 1 MG/DOSE, (OZEMPIC, 1 MG/DOSE,) 2 MG/1.5ML SOPN Inject 0.75 mLs (1 mg total) into the  skin once a week. 3 pen 3  . Bromfenac Sodium (PROLENSA) 0.07 % SOLN Place 1 drop into both eyes 4 (four) times daily. 6 mL 3   No current facility-administered medications on file prior to visit.    Allergies  Allergen Reactions  . Bamlanivimab Anaphylaxis, Itching, Palpitations, Rash, Shortness Of Breath and Swelling  . Botox [Onabotulinumtoxina] Shortness Of Breath    syncope  . Clindamycin/Lincomycin Other (See Comments)  . Kiwi Extract Shortness Of Breath  . Maxalt [Rizatriptan Benzoate] Anaphylaxis    Chest pain  . Nitrous Oxide Shortness Of Breath    syncope  . Triptans Shortness Of Breath  . Aspirin Nausea And Vomiting    Mouth blisters  . Bamlanivimab   . Clindamycin   . Contrast Media [Iodinated Diagnostic Agents]   . Erythromycin   . Flagyl [Metronidazole]   . Haemophilus Influenzae Vaccines   . Hydrocodone   . Hydrocodone-Acetaminophen Other (See Comments)  . Kiwi Extract   . Latex     Sometimes causes rash  . Mango Flavor   . Nitrous Oxide   . Onabotulinumtoxina   . Penicillins   . Rizatriptan   . Triptans   . Vicodin [Hydrocodone-Acetaminophen]     hallucinations  . Influenza Virus Vaccine Palpitations  . Penicillins Nausea And Vomiting, Rash and Other (See Comments)    Did it involve swelling of the face/tongue/throat, SOB, or low BP? No Did it involve sudden or severe rash/hives, skin peeling, or any reaction on the inside of your mouth or nose? No Did you need to seek medical attention at a hospital or doctor's office?  Yes When did it last happen?patient was 50 years old If all above answers are "NO", may proceed with cephalosporin use.  . Tetracyclines & Related Nausea And Vomiting and Rash    Family History  Problem Relation Age of Onset  . Diabetes Mother   . Heart disease Mother 36  . Hypertension Mother   . Hyperlipidemia Mother   . Heart failure Mother   . Stroke Mother   . Thyroid disease Mother   . Depression Mother   . Sleep apnea Mother   . Obesity Mother   . Colon polyps Mother   . Cancer Father 17       Lung Cancer  . Heart disease Father   . Mental illness Sister        bipolar, substance abuse,  clean 2 yrs  . Hypertension Sister   . Cancer Paternal Grandmother 44       breast cancer  . Breast cancer Paternal Grandmother   . Diabetes Maternal Grandmother   . Hypertension Maternal Grandmother   . Diabetes Son   . Colon cancer Neg Hx   . Esophageal cancer Neg Hx   . Rectal cancer Neg Hx   . Stomach cancer Neg Hx     BP 114/78 (BP Location: Right Arm, Patient Position: Sitting, Cuff Size: Large)   Pulse 90   Ht 5\' 6"  (1.676 m)   Wt 218 lb (98.9 kg)   SpO2 95%   BMI 35.19 kg/m    Review of Systems She has ongoing diarrhea.      Objective:   Physical Exam VITAL SIGNS:  See vs page GENERAL: no distress Pulses: dorsalis pedis intact bilat.   MSK: no deformity of the feet CV: no leg edema Skin:  no ulcer on the feet.  normal color and temp on the feet. Neuro: sensation is intact to touch on  the feet  A1c=6.9%    Assessment & Plan:  Insulin-requiring type 2 DM.  Diarrhea, due to or exac by metformin.    Patient Instructions  Please reduce the metformin-XR to 1 pill per day, and:  Increase the Tresiba to 14 units per day and:  Please continue the same Humalog and Ozempic.   check your blood sugar twice a day.  vary the time of day when you check, between before the 3 meals, and at bedtime.  also check if you have symptoms of your blood sugar being too  high or too low.  please keep a record of the readings and bring it to your next appointment here (or you can bring the meter itself).  You can write it on any piece of paper.  please call us sooner if your blood sugar goes below 70, or if you have a lot of readings over 200.   After the steroid shot, take 5 extra units of humalog for any blood sugar in the 200's, and 10 extra for any over 300.   I have sent a prescription to your pharmacy, for the continuous glucose monitor.   Please come back for a follow-up appointment in approx 3 months.

## 2020-10-26 NOTE — Patient Instructions (Addendum)
Please reduce the metformin-XR to 1 pill per day, and:  Increase the Tresiba to 14 units per day and:  Please continue the same Humalog and Ozempic.   check your blood sugar twice a day.  vary the time of day when you check, between before the 3 meals, and at bedtime.  also check if you have symptoms of your blood sugar being too high or too low.  please keep a record of the readings and bring it to your next appointment here (or you can bring the meter itself).  You can write it on any piece of paper.  please call us sooner if your blood sugar goes below 70, or if you have a lot of readings over 200.   After the steroid shot, take 5 extra units of humalog for any blood sugar in the 200's, and 10 extra for any over 300.   I have sent a prescription to your pharmacy, for the continuous glucose monitor.   Please come back for a follow-up appointment in approx 3 months.

## 2020-10-27 ENCOUNTER — Other Ambulatory Visit (HOSPITAL_COMMUNITY): Payer: Self-pay

## 2020-11-05 ENCOUNTER — Other Ambulatory Visit (HOSPITAL_COMMUNITY): Payer: Self-pay

## 2020-11-05 MED FILL — Fremanezumab-vfrm Subcutaneous Soln Pref Syr 225 MG/1.5ML: SUBCUTANEOUS | 28 days supply | Qty: 1.5 | Fill #0 | Status: CN

## 2020-11-11 ENCOUNTER — Other Ambulatory Visit (HOSPITAL_COMMUNITY): Payer: Self-pay

## 2020-11-17 ENCOUNTER — Other Ambulatory Visit (HOSPITAL_COMMUNITY): Payer: Self-pay

## 2020-11-20 ENCOUNTER — Other Ambulatory Visit (HOSPITAL_COMMUNITY): Payer: Self-pay

## 2020-11-20 ENCOUNTER — Other Ambulatory Visit: Payer: Self-pay | Admitting: Endocrinology

## 2020-11-20 ENCOUNTER — Other Ambulatory Visit: Payer: Self-pay | Admitting: Internal Medicine

## 2020-11-20 DIAGNOSIS — E1165 Type 2 diabetes mellitus with hyperglycemia: Secondary | ICD-10-CM

## 2020-11-21 MED ORDER — TRESIBA FLEXTOUCH 100 UNIT/ML ~~LOC~~ SOPN
14.0000 [IU] | PEN_INJECTOR | Freq: Every day | SUBCUTANEOUS | 3 refills | Status: DC
  Filled 2020-11-21: qty 3, 28d supply, fill #0

## 2020-11-23 ENCOUNTER — Ambulatory Visit (AMBULATORY_SURGERY_CENTER): Payer: 59

## 2020-11-23 ENCOUNTER — Other Ambulatory Visit: Payer: Self-pay

## 2020-11-23 ENCOUNTER — Other Ambulatory Visit (HOSPITAL_COMMUNITY): Payer: Self-pay

## 2020-11-23 VITALS — Ht 66.0 in | Wt 213.0 lb

## 2020-11-23 DIAGNOSIS — Z1211 Encounter for screening for malignant neoplasm of colon: Secondary | ICD-10-CM

## 2020-11-23 MED ORDER — NA SULFATE-K SULFATE-MG SULF 17.5-3.13-1.6 GM/177ML PO SOLN
1.0000 | Freq: Once | ORAL | 0 refills | Status: AC
  Filled 2020-11-23: qty 354, 1d supply, fill #0

## 2020-11-23 NOTE — Progress Notes (Signed)
No egg or soy allergy known to patient  No issues with past sedation with any surgeries or procedures Patient denies ever being told they had issues or difficulty with intubation  No FH of Malignant Hyperthermia No diet pills per patient No home 02 use per patient  No blood thinners per patient  Pt reports issues with constipation/bloating/diarrhea- reports she increases fluids/increase activity/start stool softener/BRAT diet R/T DM  No A fib or A flutter  EMMI video via MyChart  COVID 19 guidelines implemented in [facility] today with Pt and RN   NO PA's for preps discussed with pt in [facility] today  Discussed with pt there will be an out-of-pocket cost for prep and that varies from $0 to 70 dollars   Due to the COVID-19 pandemic we are asking patients to follow certain guidelines.  Pt aware of COVID protocols and [facility] guidelines   Patient advised to take her home RX Zofran at least 30 minutes prior to each prep dose;

## 2020-11-24 ENCOUNTER — Other Ambulatory Visit (HOSPITAL_COMMUNITY): Payer: Self-pay

## 2020-11-24 ENCOUNTER — Other Ambulatory Visit (HOSPITAL_BASED_OUTPATIENT_CLINIC_OR_DEPARTMENT_OTHER): Payer: Self-pay

## 2020-11-26 ENCOUNTER — Other Ambulatory Visit (HOSPITAL_COMMUNITY): Payer: Self-pay

## 2020-11-26 ENCOUNTER — Encounter: Payer: Self-pay | Admitting: Endocrinology

## 2020-11-26 MED ORDER — EPINEPHRINE 0.3 MG/0.3ML IJ SOAJ
0.3000 mg | INTRAMUSCULAR | 3 refills | Status: DC | PRN
  Filled 2020-11-26: qty 2, 30d supply, fill #0

## 2020-11-26 MED ORDER — ATORVASTATIN CALCIUM 20 MG PO TABS
1.0000 | ORAL_TABLET | Freq: Every day | ORAL | 3 refills | Status: DC
  Filled 2020-11-26: qty 90, 90d supply, fill #0
  Filled 2021-04-26: qty 90, 90d supply, fill #1
  Filled 2021-08-24: qty 90, 90d supply, fill #2

## 2020-11-26 MED FILL — Fremanezumab-vfrm Subcutaneous Soln Pref Syr 225 MG/1.5ML: SUBCUTANEOUS | 28 days supply | Qty: 1.5 | Fill #0 | Status: CN

## 2020-11-29 ENCOUNTER — Other Ambulatory Visit: Payer: Self-pay

## 2020-11-29 DIAGNOSIS — E1165 Type 2 diabetes mellitus with hyperglycemia: Secondary | ICD-10-CM

## 2020-11-29 MED ORDER — OZEMPIC (1 MG/DOSE) 4 MG/3ML ~~LOC~~ SOPN
1.0000 mg | PEN_INJECTOR | SUBCUTANEOUS | 3 refills | Status: DC
  Filled 2020-11-29: qty 9, 84d supply, fill #0

## 2020-11-30 ENCOUNTER — Other Ambulatory Visit (HOSPITAL_COMMUNITY): Payer: Self-pay

## 2020-11-30 MED FILL — Fremanezumab-vfrm Subcutaneous Soln Pref Syr 225 MG/1.5ML: SUBCUTANEOUS | 84 days supply | Qty: 4.5 | Fill #0 | Status: CN

## 2020-12-01 ENCOUNTER — Other Ambulatory Visit (HOSPITAL_COMMUNITY): Payer: Self-pay

## 2020-12-01 ENCOUNTER — Encounter: Payer: Self-pay | Admitting: Gastroenterology

## 2020-12-02 ENCOUNTER — Other Ambulatory Visit (HOSPITAL_COMMUNITY): Payer: Self-pay

## 2020-12-03 ENCOUNTER — Other Ambulatory Visit: Payer: Self-pay

## 2020-12-03 ENCOUNTER — Encounter: Payer: Self-pay | Admitting: Gastroenterology

## 2020-12-03 ENCOUNTER — Ambulatory Visit (AMBULATORY_SURGERY_CENTER): Payer: 59 | Admitting: Gastroenterology

## 2020-12-03 VITALS — BP 121/75 | HR 77 | Temp 97.5°F | Resp 12 | Ht 66.0 in | Wt 213.0 lb

## 2020-12-03 DIAGNOSIS — K621 Rectal polyp: Secondary | ICD-10-CM

## 2020-12-03 DIAGNOSIS — Z1211 Encounter for screening for malignant neoplasm of colon: Secondary | ICD-10-CM | POA: Diagnosis not present

## 2020-12-03 DIAGNOSIS — D128 Benign neoplasm of rectum: Secondary | ICD-10-CM

## 2020-12-03 MED ORDER — SODIUM CHLORIDE 0.9 % IV SOLN
500.0000 mL | Freq: Once | INTRAVENOUS | Status: DC
Start: 2020-12-03 — End: 2020-12-03

## 2020-12-03 NOTE — Progress Notes (Signed)
Called to room to assist during endoscopic procedure.  Patient ID and intended procedure confirmed with present staff. Received instructions for my participation in the procedure from the performing physician.  

## 2020-12-03 NOTE — Patient Instructions (Addendum)
Handout given on polyps  YOU HAD AN ENDOSCOPIC PROCEDURE TODAY AT [facility]:   Refer to the procedure report that was given to you for any specific questions about what was found during the examination.  If the procedure report does not answer your questions, please call your gastroenterologist to clarify.  If you requested that your care partner not be given the details of your procedure findings, then the procedure report has been included in a sealed envelope for you to review at your convenience later.  YOU SHOULD EXPECT: Some feelings of bloating in the abdomen. Passage of more gas than usual.  Walking can help get rid of the air that was put into your GI tract during the procedure and reduce the bloating. If you had a lower endoscopy (such as a colonoscopy or flexible sigmoidoscopy) you may notice spotting of blood in your stool or on the toilet paper. If you underwent a bowel prep for your procedure, you may not have a normal bowel movement for a few days.  Please Note:  You might notice some irritation and congestion in your nose or some drainage.  This is from the oxygen used during your procedure.  There is no need for concern and it should clear up in a day or so.  SYMPTOMS TO REPORT IMMEDIATELY:  Following lower endoscopy (colonoscopy or flexible sigmoidoscopy):  Excessive amounts of blood in the stool  Significant tenderness or worsening of abdominal pains  Swelling of the abdomen that is new, acute  Fever of 100F or higher   For urgent or emergent issues, a gastroenterologist can be reached at any hour by calling [phone]. Do not use MyChart messaging for urgent concerns.    DIET:  We do recommend a small meal at first, but then you may proceed to your regular diet.  Drink plenty of fluids but you should avoid alcoholic beverages for 24 hours.  ACTIVITY:  You should plan to take it easy for the rest of today and you should NOT DRIVE or use heavy  machinery until tomorrow (because of the sedation medicines used during the test).    FOLLOW UP: Our staff will call the number listed on your records 48-72 hours following your procedure to check on you and address any questions or concerns that you may have regarding the information given to you following your procedure. If we do not reach you, we will leave a message.  We will attempt to reach you two times.  During this call, we will ask if you have developed any symptoms of COVID 19. If you develop any symptoms (ie: fever, flu-like symptoms, shortness of breath, cough etc.) before then, please call [phone].  If you test positive for Covid 19 in the 2 weeks post procedure, please call and report this information to [place].    If any biopsies were taken you will be contacted by phone or by letter within the next 1-3 weeks.  Please call us at [phone] if you have not heard about the biopsies in 3 weeks.    SIGNATURES/CONFIDENTIALITY: You and/or your care partner have signed paperwork which will be entered into your electronic medical record.  These signatures attest to the fact that that the information above on your After Visit Summary has been reviewed and is understood.  Full responsibility of the confidentiality of this discharge information lies with you and/or your care-partner.

## 2020-12-03 NOTE — Op Note (Addendum)
[facility] Patient Name: [name] Procedure Date: 12/03/2020 8:26 AM MRN: [id number] Endoscopist: Thornton Park MD, MD Age: 50 Referring MD:  Date of Birth: September 28, 1970 Gender: Female Account #: [id number] Procedure:                Colonoscopy Indications:              Screening for colorectal malignant neoplasm,                            inadequate bowel prep on last colonoscopy (more                            recent than 10 years ago)                           No known family history of colon cancer or polyps                           Incomplete colonoscopy 10/2020 due to poor prep. One                            hyperplastic polyp removed. Right and left sided                            colon biopsies were normal.                           Incidental history: diarrhea-IBS Medicines:                Monitored Anesthesia Care Procedure:                Pre-Anesthesia Assessment:                           - Prior to the procedure, a History and Physical                            was performed, and patient medications and                            allergies were reviewed. The patient's tolerance of                            previous anesthesia was also reviewed. The risks                            and benefits of the procedure and the sedation                            options and risks were discussed with the patient.                            All questions were answered, and informed consent  was obtained. Prior Anticoagulants: The patient has                            taken no previous anticoagulant or antiplatelet                            agents. ASA Grade Assessment: II - A patient with                            mild systemic disease. After reviewing the risks                            and benefits, the patient was deemed in                            satisfactory condition to undergo the procedure.                            After obtaining informed consent, the colonoscope                            was passed under direct vision. Throughout the                            procedure, the patient's blood pressure, pulse, and                            oxygen saturations were monitored continuously. The                            Olympus PFC-[id number] (#[id number]) Colonoscope was                            introduced through the anus and advanced to the 2                            cm into the ileum. The colonoscopy was performed                            without difficulty. The patient tolerated the                            procedure well. The quality of the bowel                            preparation was adequate. The ileocecal valve,                            appendiceal orifice, and rectum were photographed.                            Although photos were taken, a technical difficulty  occurred and they were not captured for the                            procedure note. Scope In: 8:48:35 AM Scope Out: 9:08:25 AM Scope Withdrawal Time: 0 hours 15 minutes 12 seconds  Total Procedure Duration: 0 hours 19 minutes 50 seconds  Findings:                 The perianal and digital rectal examinations were                            normal.                           A 2 mm polyp was found in the rectum. The polyp was                            sessile. The polyp was removed with a cold snare.                            Resection and retrieval were complete. Estimated                            blood loss was minimal.                           The exam was otherwise without abnormality on                            direct and retroflexion views. Complications:            No immediate complications. Estimated blood loss:                            Minimal. Estimated Blood Loss:     Estimated blood loss was minimal. Impression:               - One 2 mm polyp in the rectum, removed with a cold                             snare. Resected and retrieved.                           - The examination was otherwise normal on direct                            and retroflexion views. Recommendation:           - Patient has a contact number available for                            emergencies. The signs and symptoms of potential                            delayed complications were discussed with the  patient. Return to normal activities tomorrow.                            Written discharge instructions were provided to the                            patient.                           - Resume previous diet.                           - Add a daily dose of Benefiber. Please let me know                            if this isn't helping your symptoms after 3-4 weeks                            of daily use.                           - Continue present medications.                           - Await pathology results.                           - Repeat colonoscopy date to be determined after                            pending pathology results are reviewed for                            surveillance. Two day bowel prep should be used for                            all future procedures.                           - Emerging evidence supports eating a diet of                            fruits, vegetables, grains, calcium, and yogurt                            while reducing red meat and alcohol may reduce the                            risk of colon cancer.                           - Thank you for allowing me to be involved in your                            colon cancer prevention. Thornton Park MD, MD 12/03/2020  9:14:32 AM This report has been signed electronically.

## 2020-12-03 NOTE — Progress Notes (Signed)
To PACU, VSS. Report to [email or web address] 

## 2020-12-03 NOTE — Progress Notes (Signed)
VS by CW  I have reviewed the patient's medical history in detail and updated the computerized patient record.  

## 2020-12-08 ENCOUNTER — Other Ambulatory Visit (HOSPITAL_COMMUNITY): Payer: Self-pay

## 2020-12-08 ENCOUNTER — Telehealth: Payer: Self-pay

## 2020-12-08 MED FILL — Fremanezumab-vfrm Subcutaneous Soln Pref Syr 225 MG/1.5ML: SUBCUTANEOUS | 84 days supply | Qty: 4.5 | Fill #0 | Status: CN

## 2020-12-08 NOTE — Telephone Encounter (Signed)
  Follow up Call-  Call back number 12/03/2020 10/06/2020  Post procedure Call Back phone  # [phone]  Permission to leave phone message Yes Yes  Some recent data might be hidden     Patient questions:  Do you have a fever, pain , or abdominal swelling? No. Pain Score  0 *  Have you tolerated food without any problems? Yes.    Have you been able to return to your normal activities? Yes.    Do you have any questions about your discharge instructions: Diet   No. Medications  No. Follow up visit  No.  Do you have questions or concerns about your Care? No.  Actions: * If pain score is 4 or above: No action needed, pain <4.  Have you developed a fever since your procedure? no  2.   Have you had an respiratory symptoms (SOB or cough) since your procedure? no  3.   Have you tested positive for COVID 19 since your procedure no  4.   Have you had any family members/close contacts diagnosed with the COVID 19 since your procedure?  no   If yes to any of these questions please route to Joylene John, RN and Joella Prince, RN

## 2020-12-09 ENCOUNTER — Other Ambulatory Visit (HOSPITAL_COMMUNITY): Payer: Self-pay

## 2020-12-09 MED FILL — Fremanezumab-vfrm Subcutaneous Soln Pref Syr 225 MG/1.5ML: SUBCUTANEOUS | 90 days supply | Qty: 4.5 | Fill #0 | Status: CN

## 2020-12-09 MED FILL — Fremanezumab-vfrm Subcutaneous Soln Pref Syr 225 MG/1.5ML: SUBCUTANEOUS | 84 days supply | Qty: 4.5 | Fill #0 | Status: CN

## 2020-12-10 ENCOUNTER — Other Ambulatory Visit (HOSPITAL_COMMUNITY): Payer: Self-pay

## 2020-12-10 MED ORDER — CLONIDINE 0.1 MG/24HR TD PTWK
0.1000 mg | MEDICATED_PATCH | TRANSDERMAL | 5 refills | Status: DC
  Filled 2020-12-10: qty 4, 28d supply, fill #0

## 2020-12-11 ENCOUNTER — Other Ambulatory Visit (HOSPITAL_COMMUNITY): Payer: Self-pay

## 2020-12-15 ENCOUNTER — Other Ambulatory Visit: Payer: Self-pay | Admitting: Gastroenterology

## 2020-12-15 ENCOUNTER — Other Ambulatory Visit: Payer: Self-pay | Admitting: Internal Medicine

## 2020-12-15 ENCOUNTER — Other Ambulatory Visit (HOSPITAL_COMMUNITY): Payer: Self-pay

## 2020-12-15 MED ORDER — DICYCLOMINE HCL 20 MG PO TABS
20.0000 mg | ORAL_TABLET | Freq: Four times a day (QID) | ORAL | 1 refills | Status: DC
  Filled 2020-12-15: qty 90, 23d supply, fill #0
  Filled 2021-02-22: qty 90, 23d supply, fill #1

## 2020-12-15 MED FILL — Fremanezumab-vfrm Subcutaneous Soln Pref Syr 225 MG/1.5ML: SUBCUTANEOUS | 90 days supply | Qty: 4.5 | Fill #0 | Status: CN

## 2020-12-16 ENCOUNTER — Other Ambulatory Visit (HOSPITAL_COMMUNITY): Payer: Self-pay

## 2020-12-16 ENCOUNTER — Other Ambulatory Visit: Payer: Self-pay | Admitting: Internal Medicine

## 2020-12-16 MED ORDER — METOPROLOL SUCCINATE ER 25 MG PO TB24
12.5000 mg | ORAL_TABLET | Freq: Every day | ORAL | 2 refills | Status: DC
  Filled 2020-12-16: qty 45, 90d supply, fill #0
  Filled 2021-04-27: qty 45, 90d supply, fill #1
  Filled 2021-08-24: qty 45, 90d supply, fill #2

## 2020-12-16 NOTE — Telephone Encounter (Signed)
Contact pt to verify if pt is taking Metoprolol Succinate 25 mg 1/2 tablet qd.

## 2020-12-18 ENCOUNTER — Other Ambulatory Visit (HOSPITAL_COMMUNITY): Payer: Self-pay

## 2020-12-22 ENCOUNTER — Encounter: Payer: Self-pay | Admitting: *Deleted

## 2020-12-22 ENCOUNTER — Other Ambulatory Visit: Payer: Self-pay | Admitting: *Deleted

## 2020-12-22 ENCOUNTER — Other Ambulatory Visit (HOSPITAL_COMMUNITY): Payer: Self-pay

## 2020-12-22 ENCOUNTER — Encounter: Payer: Self-pay | Admitting: Gastroenterology

## 2020-12-22 MED ORDER — AJOVY 225 MG/1.5ML ~~LOC~~ SOSY
1.5000 mL | PREFILLED_SYRINGE | SUBCUTANEOUS | 6 refills | Status: DC
  Filled 2020-12-22 – 2021-01-04 (×2): qty 1.5, 30d supply, fill #0
  Filled 2021-02-12: qty 1.5, 30d supply, fill #1
  Filled 2021-03-23: qty 1.5, 30d supply, fill #2

## 2020-12-22 MED FILL — Fremanezumab-vfrm Subcutaneous Soln Pref Syr 225 MG/1.5ML: SUBCUTANEOUS | 90 days supply | Qty: 4.5 | Fill #0 | Status: CN

## 2020-12-22 MED FILL — Fremanezumab-vfrm Subcutaneous Soln Pref Syr 225 MG/1.5ML: SUBCUTANEOUS | 84 days supply | Qty: 4.5 | Fill #0 | Status: CN

## 2020-12-24 ENCOUNTER — Encounter: Payer: Self-pay | Admitting: Endocrinology

## 2020-12-25 ENCOUNTER — Other Ambulatory Visit: Payer: Self-pay | Admitting: Endocrinology

## 2020-12-25 ENCOUNTER — Other Ambulatory Visit (HOSPITAL_COMMUNITY): Payer: Self-pay

## 2020-12-25 DIAGNOSIS — E1165 Type 2 diabetes mellitus with hyperglycemia: Secondary | ICD-10-CM

## 2020-12-25 MED ORDER — TRESIBA FLEXTOUCH 100 UNIT/ML ~~LOC~~ SOPN
6.0000 [IU] | PEN_INJECTOR | Freq: Every day | SUBCUTANEOUS | 3 refills | Status: DC
Start: 2020-12-25 — End: 2021-01-26

## 2020-12-25 MED ORDER — OZEMPIC (2 MG/DOSE) 8 MG/3ML ~~LOC~~ SOPN
2.0000 mg | PEN_INJECTOR | SUBCUTANEOUS | 3 refills | Status: DC
  Filled 2020-12-25: qty 9, 84d supply, fill #0
  Filled 2021-01-19: qty 3, 28d supply, fill #0
  Filled 2021-03-23: qty 3, 28d supply, fill #1
  Filled 2021-04-26: qty 3, 28d supply, fill #2
  Filled 2021-06-07 – 2021-06-18 (×2): qty 3, 28d supply, fill #3
  Filled 2021-07-16: qty 3, 28d supply, fill #4
  Filled 2021-08-25: qty 3, 28d supply, fill #5
  Filled 2021-10-05: qty 3, 28d supply, fill #6
  Filled 2021-11-24: qty 9, 84d supply, fill #7

## 2021-01-04 ENCOUNTER — Other Ambulatory Visit (HOSPITAL_COMMUNITY): Payer: Self-pay

## 2021-01-05 ENCOUNTER — Other Ambulatory Visit (HOSPITAL_COMMUNITY): Payer: Self-pay

## 2021-01-05 MED ORDER — OMRON 3 SERIES BP MONITOR DEVI
0 refills | Status: DC
  Filled 2021-01-05 – 2021-01-06 (×2): qty 1, 1d supply, fill #0
  Filled 2021-03-23: qty 1, 30d supply, fill #0

## 2021-01-06 ENCOUNTER — Other Ambulatory Visit (HOSPITAL_COMMUNITY): Payer: Self-pay

## 2021-01-14 ENCOUNTER — Other Ambulatory Visit (HOSPITAL_COMMUNITY): Payer: Self-pay

## 2021-01-19 ENCOUNTER — Other Ambulatory Visit: Payer: Self-pay | Admitting: Endocrinology

## 2021-01-19 ENCOUNTER — Other Ambulatory Visit (HOSPITAL_COMMUNITY): Payer: Self-pay

## 2021-01-19 DIAGNOSIS — E1165 Type 2 diabetes mellitus with hyperglycemia: Secondary | ICD-10-CM

## 2021-01-20 ENCOUNTER — Other Ambulatory Visit (HOSPITAL_COMMUNITY): Payer: Self-pay

## 2021-01-26 ENCOUNTER — Other Ambulatory Visit (HOSPITAL_COMMUNITY): Payer: Self-pay

## 2021-01-26 ENCOUNTER — Ambulatory Visit: Payer: 59 | Admitting: Endocrinology

## 2021-01-26 ENCOUNTER — Other Ambulatory Visit: Payer: Self-pay

## 2021-01-26 VITALS — BP 132/70 | HR 87 | Ht 66.0 in | Wt 223.1 lb

## 2021-01-26 DIAGNOSIS — E1165 Type 2 diabetes mellitus with hyperglycemia: Secondary | ICD-10-CM | POA: Diagnosis not present

## 2021-01-26 LAB — POCT GLYCOSYLATED HEMOGLOBIN (HGB A1C): Hemoglobin A1C: 7 % — AB (ref 4.0–5.6)

## 2021-01-26 MED ORDER — INSULIN LISPRO (1 UNIT DIAL) 100 UNIT/ML (KWIKPEN)
22.0000 [IU] | PEN_INJECTOR | Freq: Two times a day (BID) | SUBCUTANEOUS | 3 refills | Status: DC
  Filled 2021-01-26: qty 39, 89d supply, fill #0
  Filled 2021-07-16: qty 39, 89d supply, fill #1

## 2021-01-26 NOTE — Progress Notes (Signed)
Subjective:    Patient ID: [name], female    DOB: [birth year], 50 y.o.   MRN: [id number]  HPI Pt returns for f/u of diabetes mellitus: DM type: Insulin-requiring type 2 Dx'ed: 0000000 Complications: DN Therapy: insulin since 2018, Ozempic, and metformin.   GDM: 1990 and 2004 DKA: never Severe hypoglycemia: never Pancreatitis: never Pancreatic imaging: normal on 2019 CT.  SDOH: none Other: She did not tolerate Jardiance (vaginitis), metformin (diarrhea), or Victoza (dysphagia); she takes multiple daily injections (but she takes humalog BID, as she eats 2 meals per day); she works 1st shift.   Interval history: no recent steroids.  Pt says cbg's vary from 65-160.  It is in general lowest in the middle of the night.  Pt says continuous glucose monitor is telling her to change sensor daily.  It also sometimes falls off.  She takes tresiba 4/d, and Humalog 20 units twice a day (just before each meal).   Past Medical History:  Diagnosis Date   Allergy    Anemia    Anxiety    claustrophobic   Asthma    Back pain    Biceps tendonosis of right shoulder    Cataract    Mixed OU   COVID-19 2020   covid PNA   Diabetes mellitus 2011   did not start metforfin, losing weight   Dyspnea    Fatty liver    Food allergy    Headache disorder    History of concussion    HLD (hyperlipidemia)    Hypertension    Hypertensive retinopathy    OU   IBS (irritable bowel syndrome)    Infertility, female    Joint pain    Lactose intolerance    Leg edema    Migraines    Palpitation    Post-menopausal    Seborrheic dermatitis    Shoulder impingement syndrome, right    Vitamin D deficiency     Past Surgical History:  Procedure Laterality Date   ABDOMINAL HYSTERECTOMY  2006   heavy menses, endometriosis, l oophrectomy   BACK SURGERY     BREAST BIOPSY Right 2018   benign   BREAST EXCISIONAL BIOPSY Right 2003   benign   BREAST EXCISIONAL BIOPSY Right 1999   benign   BREAST SURGERY      right breast x 2 , benign   COLONOSCOPY  10/06/2020   HERNIA REPAIR  2003   left inguinal    LEFT OOPHORECTOMY     SHOULDER ARTHROSCOPY WITH SUBACROMIAL DECOMPRESSION AND OPEN ROTATOR C Right 07/14/2016   Procedure: RIGHT SHOULDER ARTHROSCOPY WITH SUBACROMIAL DECOMPRESSION, DISTAL CLAVICLE RESECTION AND MINI OPEN ROTATOR CUFF REPAIR, OPEN BICEP TENDODESIS;  Surgeon: Garald Balding, MD;  Location: [facility];  Service: Orthopedics;  Laterality: Right;   SHOULDER CLOSED REDUCTION Right 09/08/2016   Procedure: RIGHT CLOSED MANIPULATION SHOULDER;  Surgeon: Garald Balding, MD;  Location: [facility];  Service: Orthopedics;  Laterality: Right;   TUBAL LIGATION     UPPER GASTROINTESTINAL ENDOSCOPY  10/06/2020    Social History   Socioeconomic History   Marital status: Divorced    Spouse name: Not on file   Number of children: 2   Years of education: 14   Highest education level: Master's degree (e.g., MA, MS, MEng, MEd, MSW, MBA)  Occupational History   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   Right-handed.   1-3 cups caffeine weekly.   Social Determinants of Health   Financial Resource Strain: Not on file  Food Insecurity: Not on file  Transportation Needs: Not on file  Physical Activity: Not on file  Stress: Not on file  Social Connections: Not on file  Intimate Partner Violence: Not on file    Current Outpatient Medications on File Prior to Visit  Medication Sig Dispense Refill   albuterol (VENTOLIN HFA) 108 (90 Base) MCG/ACT inhaler Inhale 2 puffs into the lungs every 6  (six) hours as needed for wheezing or shortness of breath. 18 g 0   ALPRAZolam (XANAX) 0.5 MG tablet Take 1 tablet (0.5 mg total) by mouth 3 (three) times daily as needed (Headache).     aspirin EC 81 MG tablet Take 81 mg by mouth daily.     atorvastatin (LIPITOR) 20 MG tablet Take 1 tablet (20 mg total) by mouth daily. 90 tablet 3   Blood Glucose Monitoring Suppl (FREESTYLE LITE) DEVI 1 each by Does not apply route 2 (two) times daily. E11.9 1 each 0   Blood Pressure Monitoring (OMRON 3 SERIES BP MONITOR) DEVI Use as directed 1 each 0   butalbital-acetaminophen-caffeine (FIORICET) 50-325-40 MG tablet Take 1 tablet by mouth every 6 (six) hours as needed for headache. Do not refill in less than 30 day 30 tablet 1   cloNIDine (CATAPRES - DOSED IN MG/24 HR) 0.1 mg/24hr patch Place 1 patch (0.1 mg total) onto the skin once a week for neuropathic pain. Monitor blood pressure 4 patch 5   Continuous Blood Gluc Receiver (FREESTYLE LIBRE 2 READER) DEVI Use as directed 1 each 1   Continuous Blood Gluc Sensor (FREESTYLE LIBRE 2 SENSOR) MISC Inject 1 Device into the skin every 14 (fourteen) days. 6 each 3   diazepam (VALIUM) 2 MG tablet Take 1 tablet (2 mg total) by mouth every 6 (six) hours as needed (Headache). 30 tablet 5   dicyclomine (BENTYL) 20 MG tablet Take 1 tablet (20 mg total) by mouth every 6 (six) hours. 90 tablet 1   EPINEPHrine 0.3 mg/0.3 mL IJ SOAJ injection Inject 0.3 mg into the muscle as needed (for allergic reaction). 2 each 3   Fremanezumab-vfrm (AJOVY) 225 MG/1.5ML SOSY Inject 1.5 mLs into the skin every 30 (thirty) days. 1.5 mL 6   Fremanezumab-vfrm 225 MG/1.5ML SOSY INJECT 4.5 MLS INTO THE SKIN EVERY 3 (THREE) MONTHS 4.5 mL 4   glucose blood (FREESTYLE LITE) test strip 1 each by Other route 2 (two) times daily. E11.9 200 each 0   Insulin Pen Needle 31G X 8 MM MISC Use as directed 2 times daily. 200 each 3   Lancets (FREESTYLE) lancets 1 each by Other route 2 (two) times daily. E11.9 200  each 0   losartan-hydrochlorothiazide (HYZAAR) 50-12.5 MG tablet Take 1 tablet by mouth daily. 90 tablet 1   metFORMIN (GLUCOPHAGE-XR) 500 MG 24 hr tablet Take 1 tablet (500 mg total) by mouth daily. 90 tablet 3   metoprolol succinate (TOPROL-XL) 25 MG 24 hr tablet Take 0.5 tablets (12.5 mg total) by mouth daily. 45 tablet 2  ondansetron (ZOFRAN ODT) 4 MG disintegrating tablet Take 1 tablet (4 mg total) by mouth every 8 (eight) hours as needed for nausea or vomiting. 20 tablet 0   OXcarbazepine ER 150 MG TB24 TAKE 1 TABLET BY MOUTH DAILY AT BEDTIME 30 tablet 5   oxyCODONE-acetaminophen (PERCOCET/ROXICET) 5-325 MG tablet Take by mouth as needed for severe pain.     pantoprazole (PROTONIX) 40 MG tablet Take 1 tablet (40 mg total) by mouth daily. Take only with water and take 30 minutes before eating. 90 tablet 3   Semaglutide, 2 MG/DOSE, (OZEMPIC, 2 MG/DOSE,) 8 MG/3ML SOPN Inject 2 mg into the skin once a week. 9 mL 3   No current facility-administered medications on file prior to visit.    Allergies  Allergen Reactions   Bamlanivimab Anaphylaxis, Itching, Palpitations, Rash, Shortness Of Breath and Swelling   Botox [Onabotulinumtoxina] Shortness Of Breath    syncope   Clindamycin/Lincomycin Other (See Comments)   Kiwi Extract Shortness Of Breath   Maxalt [Rizatriptan Benzoate] Anaphylaxis    Chest pain   Nitrous Oxide Shortness Of Breath    syncope   Triptans Shortness Of Breath   Aspirin Nausea And Vomiting    Mouth blisters   Contrast Media [Iodinated Diagnostic Agents]    Erythromycin    Flagyl [Metronidazole]    Haemophilus Influenzae Vaccines    Latex     Sometimes causes rash   Mango Flavor    Vicodin [Hydrocodone-Acetaminophen]     hallucinations   Influenza Virus Vaccine Palpitations   Penicillins Nausea And Vomiting, Rash and Other (See Comments)    Did it involve swelling of the face/tongue/throat, SOB, or low BP? No Did it involve sudden or severe rash/hives, skin  peeling, or any reaction on the inside of your mouth or nose? No Did you need to seek medical attention at a hospital or doctor's office? Yes When did it last happen?     patient was 50 years old  If all above answers are "NO", may proceed with cephalosporin use.   Tetracyclines & Related Nausea And Vomiting and Rash    Family History  Problem Relation Age of Onset   Diabetes Mother    Heart disease Mother 39   Hypertension Mother    Hyperlipidemia Mother    Heart failure Mother    Stroke Mother    Thyroid disease Mother    Depression Mother    Sleep apnea Mother    Obesity Mother    Colon polyps Mother    Cancer Father 8       Lung Cancer   Heart disease Father    Mental illness Sister        bipolar, substance abuse,  clean 2 yrs   Hypertension Sister    Cancer Paternal Grandmother 23       breast cancer   Breast cancer Paternal Grandmother    Diabetes Maternal Grandmother    Hypertension Maternal Grandmother    Diabetes Son    Colon cancer Neg Hx    Esophageal cancer Neg Hx    Rectal cancer Neg Hx    Stomach cancer Neg Hx     BP 132/70 (BP Location: Left Arm, Patient Position: Sitting, Cuff Size: Large)   Pulse 87   Ht '5\' 6"'$  (1.676 m)   Wt 223 lb 1.6 oz (101.2 kg)   SpO2 96%   BMI 36.01 kg/m    Review of Systems Denies diarrhea.      Objective:   Physical  Exam Pulses: dorsalis pedis intact bilat.   MSK: no deformity of the feet CV: no leg edema Skin:  no ulcer on the feet.  normal color and temp on the feet. Neuro: sensation is intact to touch on the feet  Lab Results  Component Value Date   HGBA1C 7.0 (A) 01/26/2021       Assessment & Plan:  Insulin-requiring type 2 DM.t Hypoglycemia, due to insulin: Based on the pattern of her cbg's, she needs some adjustment in her therapy.   Device malfunction  Patient Instructions  Please stop taking the [place], and:  I have sent a prescription to your pharmacy, to increase the Humalog to 22 units twice  a day (just before each meal) Please continue the same metformin and Ozempic.  Try applying benzoin to the skin, to keep it from falling off. check your blood sugar twice a day.  vary the time of day when you check, between before the 3 meals, and at bedtime.  also check if you have symptoms of your blood sugar being too high or too low.  please keep a record of the readings and bring it to your next appointment here (or you can bring the meter itself).  You can write it on any piece of paper.  please call us sooner if your blood sugar goes below 70, or if you have a lot of readings over 200.   Please come back for a follow-up appointment in approx 3 months.

## 2021-01-26 NOTE — Patient Instructions (Addendum)
Please stop taking the [place], and:  I have sent a prescription to your pharmacy, to increase the Humalog to 22 units twice a day (just before each meal) Please continue the same metformin and Ozempic.  Try applying benzoin to the skin, to keep it from falling off. check your blood sugar twice a day.  vary the time of day when you check, between before the 3 meals, and at bedtime.  also check if you have symptoms of your blood sugar being too high or too low.  please keep a record of the readings and bring it to your next appointment here (or you can bring the meter itself).  You can write it on any piece of paper.  please call us sooner if your blood sugar goes below 70, or if you have a lot of readings over 200.   Please come back for a follow-up appointment in approx 3 months.

## 2021-01-27 ENCOUNTER — Other Ambulatory Visit (HOSPITAL_COMMUNITY): Payer: Self-pay

## 2021-02-02 ENCOUNTER — Other Ambulatory Visit (HOSPITAL_COMMUNITY): Payer: Self-pay

## 2021-02-12 ENCOUNTER — Other Ambulatory Visit (HOSPITAL_COMMUNITY): Payer: Self-pay

## 2021-02-22 ENCOUNTER — Other Ambulatory Visit: Payer: Self-pay

## 2021-03-15 ENCOUNTER — Emergency Department (INDEPENDENT_AMBULATORY_CARE_PROVIDER_SITE_OTHER): Payer: 59

## 2021-03-15 ENCOUNTER — Emergency Department (INDEPENDENT_AMBULATORY_CARE_PROVIDER_SITE_OTHER)
Admission: RE | Admit: 2021-03-15 | Discharge: 2021-03-15 | Disposition: A | Discharge status: Home / Self Care | Payer: 59 | Source: Ambulatory Visit | Attending: Family Medicine | Admitting: Family Medicine

## 2021-03-15 ENCOUNTER — Other Ambulatory Visit: Payer: Self-pay

## 2021-03-15 VITALS — BP 131/85 | HR 82 | Temp 98.4°F | Resp 17

## 2021-03-15 DIAGNOSIS — R1031 Right lower quadrant pain: Secondary | ICD-10-CM

## 2021-03-15 DIAGNOSIS — N200 Calculus of kidney: Secondary | ICD-10-CM | POA: Diagnosis not present

## 2021-03-15 DIAGNOSIS — Z90710 Acquired absence of both cervix and uterus: Secondary | ICD-10-CM | POA: Diagnosis not present

## 2021-03-15 DIAGNOSIS — R319 Hematuria, unspecified: Secondary | ICD-10-CM

## 2021-03-15 LAB — POCT URINALYSIS DIP (MANUAL ENTRY)
Bilirubin, UA: NEGATIVE
Glucose, UA: NEGATIVE mg/dL
Ketones, POC UA: NEGATIVE mg/dL
Leukocytes, UA: NEGATIVE
Nitrite, UA: NEGATIVE
Spec Grav, UA: 1.025 (ref 1.010–1.025)
Urobilinogen, UA: 1 E.U./dL
pH, UA: 7 (ref 5.0–8.0)

## 2021-03-15 MED ORDER — OXYCODONE-ACETAMINOPHEN 7.5-325 MG PO TABS: 1.0000 | ORAL_TABLET | ORAL | 0 refills | Status: DC | PRN

## 2021-03-15 MED ORDER — KETOROLAC TROMETHAMINE 60 MG/2ML IM SOLN
60.0000 mg | Freq: Once | INTRAMUSCULAR | Status: AC
  Administered 2021-03-15: 60 mg via INTRAMUSCULAR

## 2021-03-15 MED ORDER — ONDANSETRON HCL 8 MG PO TABS: 8.0000 mg | ORAL_TABLET | Freq: Three times a day (TID) | ORAL | 0 refills | Status: DC | PRN

## 2021-03-15 NOTE — Discharge Instructions (Signed)
Home to rest Take oxycodone for severe pain Take ondansetron (Zofran) as needed for nausea Drink clear liquids.  Bland diet only until you improve If you become worse instead of better, if the pain is intolerable, if you spike fever or new symptoms you must go to the emergency room

## 2021-03-15 NOTE — ED Notes (Signed)
Pt unable to urinate at moment.

## 2021-03-15 NOTE — ED Provider Notes (Signed)
[name]    CSN: [id number] Arrival date & time: 03/15/21  1444      History   Chief Complaint Chief Complaint  Patient presents with   Appointment    1500    HPI [name] is a 50 y.o. female.   HPI  Patient states that she has been having right lower quadrant abdominal pain since yesterday.  She states originally she developed discomfort during sexual relations.  She states that its been hurting ever since that time and is getting worse.  Now she is doubled over with pain holding onto her abdomen.  No nausea or vomiting.  No fever or chills.  No change in bowels.  Last bowel movement this morning.  No urinary symptoms.  She had a hysterectomy in 2008 for benign reasons.  She has her right ovary.  Does not know whether she has had her appendix removed.  No other abdominal surgeries.  She had a colonoscopy done in 2021.  It was normal except for small polyp removed.  No diverticula observed  Past Medical History:  Diagnosis Date   Allergy    Anemia    Anxiety    claustrophobic   Asthma    Back pain    Biceps tendonosis of right shoulder    Cataract    Mixed OU   COVID-19 2020   covid PNA   Diabetes mellitus 2011   did not start metforfin, losing weight   Dyspnea    Fatty liver    Food allergy    Headache disorder    History of concussion    HLD (hyperlipidemia)    Hypertension    Hypertensive retinopathy    OU   IBS (irritable bowel syndrome)    Infertility, female    Joint pain    Lactose intolerance    Leg edema    Migraines    Palpitation    Post-menopausal    Seborrheic dermatitis    Shoulder impingement syndrome, right    Vitamin D deficiency     Patient Active Problem List   Diagnosis Date Noted   Fatty infiltration of liver 07/26/2020   Diarrhea, functional 06/22/2020   Epigastric pain 06/22/2020   Uveitis, intermediate, bilateral 09/07/2019   Generalized edema 06/13/2019   Abnormal electrocardiogram (ECG) (EKG)  06/07/2019   Hospital discharge follow-up 06/07/2019   Postviral fatigue syndrome 05/30/2019   Other fatigue 01/23/2018   Shortness of breath 01/23/2018   Multinodular goiter 12/05/2017   Multiple thyroid nodules 12/05/2017   Microalbuminuria due to type 2 diabetes mellitus ([place]) 11/26/2017   Encounter for preventive health examination 11/26/2017   Menopause syndrome 06/10/2017   Adverse reaction to vaccine, sequela 03/25/2017   Palpitations 02/27/2017   PAC (premature atrial contraction) 02/27/2017   PVC's (premature ventricular contractions) 02/27/2017   PSVT (paroxysmal supraventricular tachycardia) ([place]) 11/26/2016   Nontraumatic incomplete tear of right rotator cuff 07/14/2016   AC (acromioclavicular) arthritis 07/14/2016   Impingement syndrome of right shoulder 07/14/2016   Fibrocystic breast changes, right 05/21/2016   Menopausal symptoms 10/30/2015   Insomnia 10/16/2015   Snoring 07/26/2015   Chronic migraine w/o aura w/o status migrainosus, not intractable 02/24/2015   Muscle spasm 02/24/2015   History of concussion 07/21/2014   Hyperlipidemia associated with type 2 diabetes mellitus ([place]) 06/13/2014   Vitamin D deficiency 05/07/2013   Chronic migraine 11/20/2012   S/P Total Abdominal Hysterectomy and Left Salpingo-oophorectomy 10/20/2011   Headache around the eyes    Uncontrolled type  2 diabetes mellitus 07/20/2011   Obesity (BMI 30-39.9) 07/20/2011   Essential hypertension 07/20/2011    Past Surgical History:  Procedure Laterality Date   ABDOMINAL HYSTERECTOMY  2006   heavy menses, endometriosis, l oophrectomy   BACK SURGERY     BREAST BIOPSY Right 2018   benign   BREAST EXCISIONAL BIOPSY Right 2003   benign   BREAST EXCISIONAL BIOPSY Right 1999   benign   BREAST SURGERY     right breast x 2 , benign   COLONOSCOPY  10/06/2020   HERNIA REPAIR  2003   left inguinal    LEFT OOPHORECTOMY     SHOULDER ARTHROSCOPY WITH SUBACROMIAL DECOMPRESSION AND OPEN  ROTATOR C Right 07/14/2016   Procedure: RIGHT SHOULDER ARTHROSCOPY WITH SUBACROMIAL DECOMPRESSION, DISTAL CLAVICLE RESECTION AND MINI OPEN ROTATOR CUFF REPAIR, OPEN BICEP TENDODESIS;  Surgeon: Garald Balding, MD;  Location: [facility];  Service: Orthopedics;  Laterality: Right;   SHOULDER CLOSED REDUCTION Right 09/08/2016   Procedure: RIGHT CLOSED MANIPULATION SHOULDER;  Surgeon: Garald Balding, MD;  Location: [facility];  Service: Orthopedics;  Laterality: Right;   TUBAL LIGATION     UPPER GASTROINTESTINAL ENDOSCOPY  10/06/2020    OB History     Gravida  3   Para  2   Term  2   Preterm  0   AB  1   Living  0      SAB  0   IAB  0   Ectopic  1   Multiple  0   Live Births  0            Home Medications    Prior to Admission medications   Medication Sig Start Date End Date Taking? Authorizing Provider  ondansetron (ZOFRAN) 8 MG tablet Take 1 tablet (8 mg total) by mouth every 8 (eight) hours as needed for nausea or vomiting. 03/15/21  Yes Raylene Everts, MD  oxyCODONE-acetaminophen (PERCOCET) 7.5-325 MG tablet Take 1 tablet by mouth every 4 (four) hours as needed for severe pain. 03/15/21  Yes Raylene Everts, MD  albuterol (VENTOLIN HFA) 108 (90 Base) MCG/ACT inhaler Inhale 2 puffs into the lungs every 6 (six) hours as needed for wheezing or shortness of breath. 05/23/19   Wurst, [place], PA-C  ALPRAZolam (XANAX) 0.5 MG tablet Take 1 tablet (0.5 mg total) by mouth 3 (three) times daily as needed (Headache). 06/02/19   Enzo Bi, MD  aspirin EC 81 MG tablet Take 81 mg by mouth daily.    [provider]  atorvastatin (LIPITOR) 20 MG tablet Take 1 tablet (20 mg total) by mouth daily. 11/26/20   Crecencio Mc, MD  Blood Glucose Monitoring Suppl (FREESTYLE LITE) DEVI 1 each by Does not apply route 2 (two) times daily. E11.9 07/12/19   Renato Shin, MD  Blood Pressure Monitoring (OMRON 3 SERIES BP MONITOR) DEVI Use as  directed 01/05/21     butalbital-acetaminophen-caffeine (FIORICET) 50-325-40 MG tablet Take 1 tablet by mouth every 6 (six) hours as needed for headache. Do not refill in less than 30 day 06/05/19   Crecencio Mc, MD  cloNIDine (CATAPRES - DOSED IN MG/24 HR) 0.1 mg/24hr patch Place 1 patch (0.1 mg total) onto the skin once a week for neuropathic pain. Monitor blood pressure 12/10/20     Continuous Blood Gluc Receiver (FREESTYLE LIBRE 2 READER) DEVI Use as directed 10/26/20   Renato Shin, MD  Continuous Blood Gluc Sensor (FREESTYLE LIBRE 2  SENSOR) MISC Inject 1 Device into the skin every 14 (fourteen) days. 10/26/20   Renato Shin, MD  diazepam (VALIUM) 2 MG tablet Take 1 tablet (2 mg total) by mouth every 6 (six) hours as needed (Headache). 08/07/19   Crecencio Mc, MD  dicyclomine (BENTYL) 20 MG tablet Take 1 tablet (20 mg total) by mouth every 6 (six) hours. 12/15/20   Thornton Park, MD  EPINEPHrine 0.3 mg/0.3 mL IJ SOAJ injection Inject 0.3 mg into the muscle as needed (for allergic reaction). 11/26/20   Crecencio Mc, MD  Fremanezumab-vfrm (AJOVY) 225 MG/1.5ML SOSY Inject 1.5 mLs into the skin every 30 (thirty) days. 12/22/20   Teague Carlis Abbott, Collene Leyden, PA-C  Fremanezumab-vfrm 225 MG/1.5ML SOSY INJECT 4.5 MLS INTO THE SKIN EVERY 3 (THREE) MONTHS 07/17/20 07/17/21  Jaclyn Prime, Collene Leyden, PA-C  glucose blood (FREESTYLE LITE) test strip 1 each by Other route 2 (two) times daily. E11.9 07/12/19   Renato Shin, MD  insulin lispro (HUMALOG) 100 UNIT/ML KwikPen Inject 22 Units into the skin 2 (two) times daily with a meal. 01/26/21   Renato Shin, MD  Insulin Pen Needle 31G X 8 MM MISC Use as directed 2 times daily. 09/16/20   Renato Shin, MD  Lancets (FREESTYLE) lancets 1 each by Other route 2 (two) times daily. E11.9 07/12/19   Renato Shin, MD  losartan-hydrochlorothiazide [facility]) 50-12.5 MG tablet Take 1 tablet by mouth daily. 10/13/20   Crecencio Mc, MD  metFORMIN (GLUCOPHAGE-XR) 500 MG 24 hr tablet  Take 1 tablet (500 mg total) by mouth daily. 10/26/20   Renato Shin, MD  metoprolol succinate (TOPROL-XL) 25 MG 24 hr tablet Take 0.5 tablets (12.5 mg total) by mouth daily. 12/16/20   End, Harrell Gave, MD  ondansetron (ZOFRAN ODT) 4 MG disintegrating tablet Take 1 tablet (4 mg total) by mouth every 8 (eight) hours as needed for nausea or vomiting. 09/13/19   Crecencio Mc, MD  OXcarbazepine ER 150 MG TB24 TAKE 1 TABLET BY MOUTH DAILY AT BEDTIME 06/15/20 06/15/21  Dalton-Bethea, Fabio Asa, MD  oxyCODONE-acetaminophen (PERCOCET/ROXICET) 5-325 MG tablet Take by mouth as needed for severe pain.    [provider]  pantoprazole (PROTONIX) 40 MG tablet Take 1 tablet (40 mg total) by mouth daily. Take only with water and take 30 minutes before eating. 10/06/20   Thornton Park, MD  Semaglutide, 2 MG/DOSE, (OZEMPIC, 2 MG/DOSE,) 8 MG/3ML SOPN Inject 2 mg into the skin once a week. 12/25/20   Renato Shin, MD    Family History Family History  Problem Relation Age of Onset   Diabetes Mother    Heart disease Mother 6   Hypertension Mother    Hyperlipidemia Mother    Heart failure Mother    Stroke Mother    Thyroid disease Mother    Depression Mother    Sleep apnea Mother    Obesity Mother    Colon polyps Mother    Cancer Father 60       Lung Cancer   Heart disease Father    Mental illness Sister        bipolar, substance abuse,  clean 2 yrs   Hypertension Sister    Cancer Paternal Grandmother 75       breast cancer   Breast cancer Paternal Grandmother    Diabetes Maternal Grandmother    Hypertension Maternal Grandmother    Diabetes Son    Colon cancer Neg Hx    Esophageal cancer Neg Hx  Rectal cancer Neg Hx    Stomach cancer Neg Hx     Social History Social History   Tobacco Use   Smoking status: Never   Smokeless tobacco: Never  Vaping Use   Vaping Use: Never used  Substance Use Topics   Alcohol use: No   Drug use: No     Allergies   Bamlanivimab, Botox  [onabotulinumtoxina], Clindamycin/lincomycin, Kiwi extract, Maxalt [rizatriptan benzoate], Nitrous oxide, Triptans, Aspirin, Contrast media [iodinated diagnostic agents], Erythromycin, Flagyl [metronidazole], Haemophilus influenzae vaccines, Latex, Mango flavor, Vicodin [hydrocodone-acetaminophen], Influenza virus vaccine, Penicillins, and Tetracyclines & related   Review of Systems Review of Systems See HPI  Physical Exam Triage Vital Signs ED Triage Vitals  Enc Vitals Group     BP 03/15/21 1456 131/85     Pulse Rate 03/15/21 1456 82     Resp 03/15/21 1456 17     Temp 03/15/21 1534 98.4 F (36.9 C)     Temp Source 03/15/21 1534 Oral     SpO2 03/15/21 1456 97 %     Weight --      Height --      Head Circumference --      Peak Flow --      Pain Score 03/15/21 1453 5     Pain Loc --      Pain Edu? --      Excl. in [place]? --    No data found.  Updated Vital Signs BP 131/85 (BP Location: Left Arm)   Pulse 82   Temp 98.4 F (36.9 C) (Oral)   Resp 17   SpO2 97%      Physical Exam Constitutional:      General: She is in acute distress.     Appearance: She is well-developed.     Comments: Overweight.  Appears in acute distress.  Bent over holding right abdomen.  Moves with difficulty  HENT:     Head: Normocephalic and atraumatic.     Mouth/Throat:     Comments: Mask is in place Eyes:     Conjunctiva/sclera: Conjunctivae normal.     Pupils: Pupils are equal, round, and reactive to light.  Cardiovascular:     Rate and Rhythm: Normal rate and regular rhythm.     Heart sounds: Normal heart sounds.  Pulmonary:     Effort: Pulmonary effort is normal. No respiratory distress.     Breath sounds: Normal breath sounds.  Chest:     Chest wall: No tenderness.  Abdominal:     General: There is no distension.     Palpations: Abdomen is soft.     Tenderness: There is abdominal tenderness. There is no right CVA tenderness or left CVA tenderness.     Comments: Abdomen is soft.   Bowel sounds are active.  There is tenderness to deep palpation in the right lower quadrant.  Guarding.  No rebound.  No mass palpable  Musculoskeletal:        General: Normal range of motion.     Cervical back: Normal range of motion.  Skin:    General: Skin is warm and dry.  Neurological:     General: No focal deficit present.     Mental Status: She is alert.  Psychiatric:        Mood and Affect: Mood normal.        Behavior: Behavior normal.     UC Treatments / Results  Labs (all labs ordered are listed, but only abnormal results are displayed) Labs  Reviewed  POCT URINALYSIS DIP (MANUAL ENTRY) - Abnormal; Notable for the following components:      Result Value   Blood, UA trace-intact (*)    Protein Ur, POC trace (*)    All other components within normal limits  URINE CULTURE  COMPLETE METABOLIC PANEL WITH GFR  CBC WITH DIFFERENTIAL/PLATELET    EKG   Radiology CT ABDOMEN PELVIS WO CONTRAST  Result Date: 03/15/2021 CLINICAL DATA:  Acute right lower quadrant pain EXAM: CT ABDOMEN AND PELVIS WITHOUT CONTRAST TECHNIQUE: Multidetector CT imaging of the abdomen and pelvis was performed following the standard protocol without IV contrast. COMPARISON:  07/20/2020 FINDINGS: Lower chest: No acute abnormality. Hepatobiliary: No focal hepatic abnormality. Gallbladder unremarkable. Pancreas: No focal abnormality or ductal dilatation. Spleen: No focal abnormality.  Normal size. Adrenals/Urinary Tract: Small low-density lesions in the midpole of the left kidney likely reflects a cyst although this cannot be characterized on this noncontrast study. Bilateral nonobstructing stones, the largest in the lower pole of the left kidney measuring 4 mm. No ureteral stones or hydronephrosis. Adrenal glands and urinary bladder unremarkable. Stomach/Bowel: Normal appendix. Stomach, large and small bowel grossly unremarkable. Vascular/Lymphatic: No evidence of aneurysm or adenopathy. Reproductive: Prior  hysterectomy.  No adnexal masses. Other: No free fluid or free air. Musculoskeletal: No acute bony abnormality. IMPRESSION: Normal appendix. Bilateral nephrolithiasis.  No ureteral stones or hydronephrosis. Electronically Signed   By: Rolm Baptise M.D.   On: 03/15/2021 17:19    Procedures Procedures (including critical care time)  Medications Ordered in UC Medications  ketorolac (TORADOL) injection 60 mg (60 mg Intramuscular Given 03/15/21 1744)    Initial Impression / Assessment and Plan / UC Course  I have reviewed the triage vital signs and the nursing notes.  Pertinent labs & imaging results that were available during my care of the patient were reviewed by me and considered in my medical decision making (see chart for details).  CAT scan is reassuring.  Patient continues to have severe right lower quadrant pain.  When I enter the room after her scan she is still bent over and grasping her abdomen.  We will give her a shot of Toradol for pain.  I am going to treat her for possible kidney stones with pain medication Zofran for nausea.  Patient knows to go to the emergency room if she fails to improve.  Final Clinical Impressions(s) / UC Diagnoses   Final diagnoses:  Acute abdominal pain in right lower quadrant  Bilateral kidney stones  Hematuria, unspecified type     Discharge Instructions      Home to rest Take oxycodone for severe pain Take ondansetron (Zofran) as needed for nausea Drink clear liquids.  Bland diet only until you improve If you become worse instead of better, if the pain is intolerable, if you spike fever or new symptoms you must go to the emergency room     ED Prescriptions     Medication Sig Dispense Auth. Provider   ondansetron (ZOFRAN) 8 MG tablet Take 1 tablet (8 mg total) by mouth every 8 (eight) hours as needed for nausea or vomiting. 20 tablet Raylene Everts, MD   oxyCODONE-acetaminophen (PERCOCET) 7.5-325 MG tablet Take 1 tablet by mouth  every 4 (four) hours as needed for severe pain. 15 tablet Raylene Everts, MD      I have reviewed the PDMP during this encounter.   Raylene Everts, MD 03/16/21 [phone]

## 2021-03-15 NOTE — ED Triage Notes (Signed)
Pt unable to urinate at the moment. Pt given water.

## 2021-03-15 NOTE — ED Triage Notes (Signed)
Pt presents with c/o right lower abdominal pain x 2 days. Pt denies N/V/D and fever. States she has been having headaches. Pt states she took some pain medicine at home.

## 2021-03-16 LAB — COMPLETE METABOLIC PANEL WITH GFR
AG Ratio: 1.6 (calc) (ref 1.0–2.5)
ALT: 14 U/L (ref 6–29)
AST: 15 U/L (ref 10–35)
Albumin: 4.7 g/dL (ref 3.6–5.1)
Alkaline phosphatase (APISO): 131 U/L (ref 37–153)
BUN/Creatinine Ratio: 12 (calc) (ref 6–22)
BUN: 13 mg/dL (ref 7–25)
CO2: 27 mmol/L (ref 20–32)
Calcium: 9.6 mg/dL (ref 8.6–10.4)
Chloride: 103 mmol/L (ref 98–110)
Creat: 1.09 mg/dL — ABNORMAL HIGH (ref 0.50–1.03)
Globulin: 3 g/dL (calc) (ref 1.9–3.7)
Glucose, Bld: 117 mg/dL — ABNORMAL HIGH (ref 65–99)
Potassium: 3.9 mmol/L (ref 3.5–5.3)
Sodium: 140 mmol/L (ref 135–146)
Total Bilirubin: 0.4 mg/dL (ref 0.2–1.2)
Total Protein: 7.7 g/dL (ref 6.1–8.1)
eGFR: 62 mL/min/{1.73_m2} (ref >=60)

## 2021-03-16 LAB — CBC WITH DIFFERENTIAL/PLATELET
Absolute Monocytes: 420 cells/uL (ref 200–950)
Basophils Absolute: 23 cells/uL (ref 0–200)
Basophils Relative: 0.3 %
Eosinophils Absolute: 113 cells/uL (ref 15–500)
Eosinophils Relative: 1.5 %
HCT: 43.6 % (ref 35.0–45.0)
Hemoglobin: 14.4 g/dL (ref 11.7–15.5)
Lymphs Abs: 2873 cells/uL (ref 850–3900)
MCH: 29 pg (ref 27.0–33.0)
MCHC: 33 g/dL (ref 32.0–36.0)
MCV: 87.7 fL (ref 80.0–100.0)
MPV: 11.8 fL (ref 7.5–12.5)
Monocytes Relative: 5.6 %
Neutro Abs: 4073 cells/uL (ref 1500–7800)
Neutrophils Relative %: 54.3 %
Platelets: 212 10*3/uL (ref 140–400)
RBC: 4.97 10*6/uL (ref 3.80–5.10)
RDW: 12.4 % (ref 11.0–15.0)
Total Lymphocyte: 38.3 %
WBC: 7.5 10*3/uL (ref 3.8–10.8)

## 2021-03-17 LAB — URINE CULTURE
MICRO NUMBER:: 12484348
SPECIMEN QUALITY:: ADEQUATE

## 2021-03-18 ENCOUNTER — Telehealth: Payer: 59 | Admitting: Family Medicine

## 2021-03-18 ENCOUNTER — Telehealth: Payer: Self-pay | Admitting: Internal Medicine

## 2021-03-18 ENCOUNTER — Encounter: Payer: Self-pay | Admitting: Family Medicine

## 2021-03-18 DIAGNOSIS — R102 Pelvic and perineal pain: Secondary | ICD-10-CM | POA: Diagnosis not present

## 2021-03-18 NOTE — Telephone Encounter (Signed)
Access Nurse transferred patient back to office. Patient refuses to go to the ED she states she is a [employer] and will not sit in the ED in pain. She wants to see her provider.

## 2021-03-18 NOTE — Telephone Encounter (Signed)
Patient called and stated she went to Urgent Care last weekend. She was told she has kidney stones. Patient was given medication for the pain and nausea. Patient states today pain is different and what should she do. No appointments available. Patient was transferred to Access Nurse.

## 2021-03-18 NOTE — Patient Instructions (Signed)
Please seek inperson care today as we discussed. If worsening or life threatening symptoms please cal 911.      I hope you are feeling better soon!  It was nice to meet you today. I help  out with telemedicine visits on Tuesdays and Thursdays and am available for visits on those days. If you have any concerns or questions following this visit please schedule a follow up visit with your Primary Care doctor or seek care at a local urgent care clinic to avoid delays in care.

## 2021-03-18 NOTE — Telephone Encounter (Signed)
Patient has virtual appointment today with another provider.

## 2021-03-18 NOTE — Progress Notes (Signed)
Virtual Visit via Video Note  I connected with [name]  on 03/18/21 at  3:20 PM EDT by a video enabled telemedicine application and verified that I am speaking with the correct person using two identifiers.  Location patient: home, [place] Location provider:work or home office Persons participating in the virtual visit: patient, provider  I discussed the limitations of evaluation and management by telemedicine and the availability of in person appointments. The patient expressed understanding and agreed to proceed.   HPI:  Acute telemedicine visit for Pelvic pain: -Onset: 4 days ago -Symptoms include:severe RLQ pelvic pain that started with intercourse, intermittent, severe at times, nausea -reports told possibly from kidney stones at [facility] 3 days ago. Was given pain medications and zofran. Was told to return to ER if severe pain recurred.  -per CT report: "Bilateral nonobstructing stones, the largest in the lower pole of the left kidney measuring 4 mm. No ureteral stones or hydronephrosis. Bilateral nonobstructing stones, the largest in the lower pole of the left kidney measuring 4 mm. No ureteral stones or hydronephrosis."  -today is having severe pain again in the R lower pelvis - if stands up has severe pain despite the being on pain medication. Also has severe pain if jiggles belly at all. She did not even feel well enough to get up to go to ER so made a virtual visit.  -reports CT was without contrast and they had to do the CT without waiting enough for the oral contrast as they were closing.  -Denies: vomiting, diarrhea, gross hematuria, in ability to eat/drink -Has tried:on pain medication and zofran -Pertinent past medical history: hysterectomy, unilaterally oophorectomy,  -Pertinent medication allergies: Allergies  Allergen Reactions   Bamlanivimab Anaphylaxis, Itching, Palpitations, Rash, Shortness Of Breath and Swelling   Botox [Onabotulinumtoxina] Shortness Of Breath     syncope   Clindamycin/Lincomycin Other (See Comments)   Kiwi Extract Shortness Of Breath   Maxalt [Rizatriptan Benzoate] Anaphylaxis    Chest pain   Nitrous Oxide Shortness Of Breath    syncope   Triptans Shortness Of Breath   Aspirin Nausea And Vomiting    Mouth blisters   Contrast Media [Iodinated Diagnostic Agents]    Erythromycin    Flagyl [Metronidazole]    Haemophilus Influenzae Vaccines    Latex     Sometimes causes rash   Mango Flavor    Vicodin [Hydrocodone-Acetaminophen]     hallucinations   Influenza Virus Vaccine Palpitations   Penicillins Nausea And Vomiting, Rash and Other (See Comments)    Did it involve swelling of the face/tongue/throat, SOB, or low BP? No Did it involve sudden or severe rash/hives, skin peeling, or any reaction on the inside of your mouth or nose? No Did you need to seek medical attention at a hospital or doctor's office? Yes When did it last happen?     patient was 50 years old  If all above answers are "NO", may proceed with cephalosporin use.   Tetracyclines & Related Nausea And Vomiting and Rash      ROS: See pertinent positives and negatives per HPI.  Past Medical History:  Diagnosis Date   Allergy    Anemia    Anxiety    claustrophobic   Asthma    Back pain    Biceps tendonosis of right shoulder    Cataract    Mixed OU   COVID-19 2020   covid PNA   Diabetes mellitus 2011   did not start metforfin, losing weight   Dyspnea  Fatty liver    Food allergy    Headache disorder    History of concussion    HLD (hyperlipidemia)    Hypertension    Hypertensive retinopathy    OU   IBS (irritable bowel syndrome)    Infertility, female    Joint pain    Lactose intolerance    Leg edema    Migraines    Palpitation    Post-menopausal    Seborrheic dermatitis    Shoulder impingement syndrome, right    Vitamin D deficiency     Past Surgical History:  Procedure Laterality Date   ABDOMINAL HYSTERECTOMY  2006   heavy menses,  endometriosis, l oophrectomy   BACK SURGERY     BREAST BIOPSY Right 2018   benign   BREAST EXCISIONAL BIOPSY Right 2003   benign   BREAST EXCISIONAL BIOPSY Right 1999   benign   BREAST SURGERY     right breast x 2 , benign   COLONOSCOPY  10/06/2020   HERNIA REPAIR  2003   left inguinal    LEFT OOPHORECTOMY     SHOULDER ARTHROSCOPY WITH SUBACROMIAL DECOMPRESSION AND OPEN ROTATOR C Right 07/14/2016   Procedure: RIGHT SHOULDER ARTHROSCOPY WITH SUBACROMIAL DECOMPRESSION, DISTAL CLAVICLE RESECTION AND MINI OPEN ROTATOR CUFF REPAIR, OPEN BICEP TENDODESIS;  Surgeon: Garald Balding, MD;  Location: [facility];  Service: Orthopedics;  Laterality: Right;   SHOULDER CLOSED REDUCTION Right 09/08/2016   Procedure: RIGHT CLOSED MANIPULATION SHOULDER;  Surgeon: Garald Balding, MD;  Location: [facility];  Service: Orthopedics;  Laterality: Right;   TUBAL LIGATION     UPPER GASTROINTESTINAL ENDOSCOPY  10/06/2020     Current Outpatient Medications:    albuterol (VENTOLIN HFA) 108 (90 Base) MCG/ACT inhaler, Inhale 2 puffs into the lungs every 6 (six) hours as needed for wheezing or shortness of breath., Disp: 18 g, Rfl: 0   ALPRAZolam (XANAX) 0.5 MG tablet, Take 1 tablet (0.5 mg total) by mouth 3 (three) times daily as needed (Headache)., Disp: , Rfl:    aspirin EC 81 MG tablet, Take 81 mg by mouth daily., Disp: , Rfl:    atorvastatin (LIPITOR) 20 MG tablet, Take 1 tablet (20 mg total) by mouth daily., Disp: 90 tablet, Rfl: 3   Blood Glucose Monitoring Suppl (FREESTYLE LITE) DEVI, 1 each by Does not apply route 2 (two) times daily. E11.9, Disp: 1 each, Rfl: 0   Blood Pressure Monitoring (OMRON 3 SERIES BP MONITOR) DEVI, Use as directed, Disp: 1 each, Rfl: 0   butalbital-acetaminophen-caffeine (FIORICET) 50-325-40 MG tablet, Take 1 tablet by mouth every 6 (six) hours as needed for headache. Do not refill in less than 30 day, Disp: 30 tablet, Rfl: 1   cloNIDine (CATAPRES -  DOSED IN MG/24 HR) 0.1 mg/24hr patch, Place 1 patch (0.1 mg total) onto the skin once a week for neuropathic pain. Monitor blood pressure, Disp: 4 patch, Rfl: 5   Continuous Blood Gluc Receiver (FREESTYLE LIBRE 2 READER) DEVI, Use as directed, Disp: 1 each, Rfl: 1   Continuous Blood Gluc Sensor (FREESTYLE LIBRE 2 SENSOR) MISC, Inject 1 Device into the skin every 14 (fourteen) days., Disp: 6 each, Rfl: 3   diazepam (VALIUM) 2 MG tablet, Take 1 tablet (2 mg total) by mouth every 6 (six) hours as needed (Headache)., Disp: 30 tablet, Rfl: 5   dicyclomine (BENTYL) 20 MG tablet, Take 1 tablet (20 mg total) by mouth every 6 (six) hours., Disp: 90 tablet, Rfl: 1  EPINEPHrine 0.3 mg/0.3 mL IJ SOAJ injection, Inject 0.3 mg into the muscle as needed (for allergic reaction)., Disp: 2 each, Rfl: 3   Fremanezumab-vfrm (AJOVY) 225 MG/1.5ML SOSY, Inject 1.5 mLs into the skin every 30 (thirty) days., Disp: 1.5 mL, Rfl: 6   Fremanezumab-vfrm 225 MG/1.5ML SOSY, INJECT 4.5 MLS INTO THE SKIN EVERY 3 (THREE) MONTHS, Disp: 4.5 mL, Rfl: 4   glucose blood (FREESTYLE LITE) test strip, 1 each by Other route 2 (two) times daily. E11.9, Disp: 200 each, Rfl: 0   insulin lispro (HUMALOG) 100 UNIT/ML KwikPen, Inject 22 Units into the skin 2 (two) times daily with a meal., Disp: 45 mL, Rfl: 3   Insulin Pen Needle 31G X 8 MM MISC, Use as directed 2 times daily., Disp: 200 each, Rfl: 3   Lancets (FREESTYLE) lancets, 1 each by Other route 2 (two) times daily. E11.9, Disp: 200 each, Rfl: 0   losartan-hydrochlorothiazide (HYZAAR) 50-12.5 MG tablet, Take 1 tablet by mouth daily., Disp: 90 tablet, Rfl: 1   metFORMIN (GLUCOPHAGE-XR) 500 MG 24 hr tablet, Take 1 tablet (500 mg total) by mouth daily., Disp: 90 tablet, Rfl: 3   metoprolol succinate (TOPROL-XL) 25 MG 24 hr tablet, Take 0.5 tablets (12.5 mg total) by mouth daily., Disp: 45 tablet, Rfl: 2   ondansetron (ZOFRAN ODT) 4 MG disintegrating tablet, Take 1 tablet (4 mg total) by mouth  every 8 (eight) hours as needed for nausea or vomiting., Disp: 20 tablet, Rfl: 0   ondansetron (ZOFRAN) 8 MG tablet, Take 1 tablet (8 mg total) by mouth every 8 (eight) hours as needed for nausea or vomiting., Disp: 20 tablet, Rfl: 0   OXcarbazepine ER 150 MG TB24, TAKE 1 TABLET BY MOUTH DAILY AT BEDTIME, Disp: 30 tablet, Rfl: 5   oxyCODONE-acetaminophen (PERCOCET) 7.5-325 MG tablet, Take 1 tablet by mouth every 4 (four) hours as needed for severe pain., Disp: 15 tablet, Rfl: 0   oxyCODONE-acetaminophen (PERCOCET/ROXICET) 5-325 MG tablet, Take by mouth as needed for severe pain., Disp: , Rfl:    pantoprazole (PROTONIX) 40 MG tablet, Take 1 tablet (40 mg total) by mouth daily. Take only with water and take 30 minutes before eating., Disp: 90 tablet, Rfl: 3   Semaglutide, 2 MG/DOSE, (OZEMPIC, 2 MG/DOSE,) 8 MG/3ML SOPN, Inject 2 mg into the skin once a week., Disp: 9 mL, Rfl: 3  EXAM:  VITALS per patient if applicable:  GENERAL: alert, oriented, lying in bed, looks to be in pain/uncomfortable  HEENT: atraumatic, conjunttiva clear, no obvious abnormalities on inspection of external nose and ears  NECK: normal movements of the head and neck  LUNGS: on inspection no signs of respiratory distress, breathing rate appears normal, no obvious gross SOB, gasping or wheezing  CV: no obvious cyanosis  ABD: reports if moves severe pain in abdomen - RLQ/R lower pelvis  MS: moves all visible extremities without noticeable abnormality  PSYCH/NEURO: pleasant and cooperative, no obvious depression or anxiety, speech and thought processing grossly intact  ASSESSMENT AND PLAN:  Discussed the following assessment and plan:  Pelvic pain  -we discussed possible serious and likely etiologies, options for evaluation and workup, limitations of telemedicine visit vs in person visit, treatment, treatment risks and precautions. Pt is agreeable to treatment via telemedicine at this moment. Query ovarian torsion, ?  Passing small stone, vs other. Advised given the degree of pain and unconfirmed dx that she follow prior recs to go to ER today. She agrees to go and has opted to go  via self transport.   .   I discussed the assessment and treatment plan with the patient. The patient was provided an opportunity to ask questions and all were answered. The patient agreed with the plan and demonstrated an understanding of the instructions.     Lucretia Kern, DO

## 2021-03-22 ENCOUNTER — Ambulatory Visit: Payer: 59 | Admitting: Obstetrics & Gynecology

## 2021-03-23 ENCOUNTER — Other Ambulatory Visit (HOSPITAL_COMMUNITY): Payer: Self-pay

## 2021-03-23 ENCOUNTER — Other Ambulatory Visit: Payer: Self-pay | Admitting: Endocrinology

## 2021-03-23 DIAGNOSIS — E1165 Type 2 diabetes mellitus with hyperglycemia: Secondary | ICD-10-CM

## 2021-03-23 MED ORDER — UNIFINE PENTIPS 32G X 4 MM MISC
0 refills | Status: DC
  Filled 2021-03-23: qty 100, 50d supply, fill #0

## 2021-03-23 NOTE — Addendum Note (Signed)
Addended by: Lucretia Kern on: 03/23/2021 10:19 AM   Modules accepted: Level of Service

## 2021-03-24 ENCOUNTER — Other Ambulatory Visit: Payer: Self-pay

## 2021-03-24 ENCOUNTER — Other Ambulatory Visit (HOSPITAL_COMMUNITY): Payer: Self-pay

## 2021-03-24 DIAGNOSIS — E1165 Type 2 diabetes mellitus with hyperglycemia: Secondary | ICD-10-CM

## 2021-03-24 MED ORDER — UNIFINE PENTIPS 32G X 4 MM MISC
4 refills | Status: DC
  Filled 2021-03-24 – 2021-03-25 (×2): qty 100, 50d supply, fill #0
  Filled 2021-06-18: qty 100, 50d supply, fill #1
  Filled 2021-08-30: qty 100, 50d supply, fill #2

## 2021-03-25 ENCOUNTER — Other Ambulatory Visit (HOSPITAL_COMMUNITY): Payer: Self-pay

## 2021-04-13 ENCOUNTER — Telehealth: Payer: Self-pay

## 2021-04-13 ENCOUNTER — Ambulatory Visit
Admission: EM | Admit: 2021-04-13 | Discharge: 2021-04-13 | Disposition: A | Discharge status: Home / Self Care | Payer: 59 | Attending: Internal Medicine | Admitting: Internal Medicine

## 2021-04-13 ENCOUNTER — Telehealth: Payer: Self-pay | Admitting: *Deleted

## 2021-04-13 ENCOUNTER — Other Ambulatory Visit: Payer: Self-pay

## 2021-04-13 ENCOUNTER — Encounter: Payer: Self-pay | Admitting: Emergency Medicine

## 2021-04-13 ENCOUNTER — Other Ambulatory Visit: Payer: Self-pay | Admitting: *Deleted

## 2021-04-13 DIAGNOSIS — G43909 Migraine, unspecified, not intractable, without status migrainosus: Secondary | ICD-10-CM | POA: Diagnosis not present

## 2021-04-13 DIAGNOSIS — G43709 Chronic migraine without aura, not intractable, without status migrainosus: Secondary | ICD-10-CM

## 2021-04-13 DIAGNOSIS — R202 Paresthesia of skin: Secondary | ICD-10-CM

## 2021-04-13 MED ORDER — KETOROLAC TROMETHAMINE 60 MG/2ML IM SOLN
60.0000 mg | Freq: Once | INTRAMUSCULAR | Status: AC
  Administered 2021-04-13: 60 mg via INTRAMUSCULAR

## 2021-04-13 MED ORDER — ONDANSETRON 4 MG PO TBDP
4.0000 mg | ORAL_TABLET | Freq: Once | ORAL | Status: AC
  Administered 2021-04-13: 4 mg via ORAL

## 2021-04-13 NOTE — Telephone Encounter (Signed)
Mychart message:  Good morning. I had a migraine yesterday morning that gradually worsened throughout the day and I left work early. This one is different as I have been experiencing numbness  & tingling on the Left side of my face. I still have it it this morning though it is not as severe. My headache specialist is only in on Fridays. What do you recommend? I do not have visual disturbances or difficulty speaking. I am experiencing sensitivity to some smell & photosensitivity. I normally can't smell but this headache has heightened that awareness. My taste & smell has been severely impaired since having COVID in December 2020.

## 2021-04-13 NOTE — Telephone Encounter (Signed)
Pt called back regarding her HA. States is started Sunday and became worse yesterday where she had to leave work. Pt took her rescue meds and still no much relief. She started to have numbness and tingling on the left side of her face. Pt messaged her PCP and they advised her to go to urgent care. Pt went to urgent care and they gave her toradol and zofran, but advised her to go to ER for the numbness in her face. Pt does not want to wait hours in ER with her migraine if she doesn't need to. Told pt I would reach out to Santiago Glad and have Santiago Glad call the patient to speak to her about this migraine and her symptoms.

## 2021-04-13 NOTE — ED Provider Notes (Addendum)
[facility]    CSN: [id number] Arrival date & time: 04/13/21  1010      History   Chief Complaint Chief Complaint  Patient presents with   Migraine    HPI [name] is a 50 y.o. female who presents with onset of Migraine x 3 days and since yesterday she felt tingling on both face areas, and today only feels on the L face which is new for her.  Has taken Fioricet, Baclofen, and valium and has not aborted the HA. When started was mild. Denies nasal symptoms.  Her last head scan was  Sees a neurologist, but could not see them today.  Denies weakness or numbness on her extremities. Pain level 5/10    Past Medical History:  Diagnosis Date   Allergy    Anemia    Anxiety    claustrophobic   Asthma    Back pain    Biceps tendonosis of right shoulder    Cataract    Mixed OU   COVID-19 2020   covid PNA   Diabetes mellitus 2011   did not start metforfin, losing weight   Dyspnea    Fatty liver    Food allergy    Headache disorder    History of concussion    HLD (hyperlipidemia)    Hypertension    Hypertensive retinopathy    OU   IBS (irritable bowel syndrome)    Infertility, female    Joint pain    Lactose intolerance    Leg edema    Migraines    Palpitation    Post-menopausal    Seborrheic dermatitis    Shoulder impingement syndrome, right    Vitamin D deficiency     Patient Active Problem List   Diagnosis Date Noted   Fatty infiltration of liver 07/26/2020   Diarrhea, functional 06/22/2020   Epigastric pain 06/22/2020   Uveitis, intermediate, bilateral 09/07/2019   Generalized edema 06/13/2019   Abnormal electrocardiogram (ECG) (EKG) 06/07/2019   Hospital discharge follow-up 06/07/2019   Postviral fatigue syndrome 05/30/2019   Other fatigue 01/23/2018   Shortness of breath 01/23/2018   Multinodular goiter 12/05/2017   Multiple thyroid nodules 12/05/2017   Microalbuminuria due to type 2 diabetes mellitus ([place]) 11/26/2017   Encounter  for preventive health examination 11/26/2017   Menopause syndrome 06/10/2017   Adverse reaction to vaccine, sequela 03/25/2017   Palpitations 02/27/2017   PAC (premature atrial contraction) 02/27/2017   PVC's (premature ventricular contractions) 02/27/2017   PSVT (paroxysmal supraventricular tachycardia) ([place]) 11/26/2016   Nontraumatic incomplete tear of right rotator cuff 07/14/2016   AC (acromioclavicular) arthritis 07/14/2016   Impingement syndrome of right shoulder 07/14/2016   Fibrocystic breast changes, right 05/21/2016   Menopausal symptoms 10/30/2015   Insomnia 10/16/2015   Snoring 07/26/2015   Chronic migraine w/o aura w/o status migrainosus, not intractable 02/24/2015   Muscle spasm 02/24/2015   History of concussion 07/21/2014   Hyperlipidemia associated with type 2 diabetes mellitus ([place]) 06/13/2014   Vitamin D deficiency 05/07/2013   Chronic migraine 11/20/2012   S/P Total Abdominal Hysterectomy and Left Salpingo-oophorectomy 10/20/2011   Headache around the eyes    Uncontrolled type 2 diabetes mellitus 07/20/2011   Obesity (BMI 30-39.9) 07/20/2011   Essential hypertension 07/20/2011    Past Surgical History:  Procedure Laterality Date   ABDOMINAL HYSTERECTOMY  2006   heavy menses, endometriosis, l oophrectomy   BACK SURGERY     BREAST BIOPSY Right 2018   benign   BREAST  EXCISIONAL BIOPSY Right 2003   benign   BREAST EXCISIONAL BIOPSY Right 1999   benign   BREAST SURGERY     right breast x 2 , benign   COLONOSCOPY  10/06/2020   HERNIA REPAIR  2003   left inguinal    LEFT OOPHORECTOMY     SHOULDER ARTHROSCOPY WITH SUBACROMIAL DECOMPRESSION AND OPEN ROTATOR C Right 07/14/2016   Procedure: RIGHT SHOULDER ARTHROSCOPY WITH SUBACROMIAL DECOMPRESSION, DISTAL CLAVICLE RESECTION AND MINI OPEN ROTATOR CUFF REPAIR, OPEN BICEP TENDODESIS;  Surgeon: Garald Balding, MD;  Location: [place];  Service: Orthopedics;  Laterality: Right;   SHOULDER CLOSED  REDUCTION Right 09/08/2016   Procedure: RIGHT CLOSED MANIPULATION SHOULDER;  Surgeon: Garald Balding, MD;  Location: [place];  Service: Orthopedics;  Laterality: Right;   TUBAL LIGATION     UPPER GASTROINTESTINAL ENDOSCOPY  10/06/2020    OB History     Gravida  3   Para  2   Term  2   Preterm  0   AB  1   Living  0      SAB  0   IAB  0   Ectopic  1   Multiple  0   Live Births  0            Home Medications    Prior to Admission medications   Medication Sig Start Date End Date Taking? Authorizing Provider  albuterol (VENTOLIN HFA) 108 (90 Base) MCG/ACT inhaler Inhale 2 puffs into the lungs every 6 (six) hours as needed for wheezing or shortness of breath. 05/23/19   [place], [place], PA-C  ALPRAZolam (XANAX) 0.5 MG tablet Take 1 tablet (0.5 mg total) by mouth 3 (three) times daily as needed (Headache). 06/02/19   Enzo Bi, MD  aspirin EC 81 MG tablet Take 81 mg by mouth daily.    [provider]  atorvastatin (LIPITOR) 20 MG tablet Take 1 tablet (20 mg total) by mouth daily. 11/26/20   Crecencio Mc, MD  Blood Glucose Monitoring Suppl (FREESTYLE LITE) DEVI 1 each by Does not apply route 2 (two) times daily. E11.9 07/12/19   Renato Shin, MD  Blood Pressure Monitoring (OMRON 3 SERIES BP MONITOR) DEVI Use as directed 01/05/21     butalbital-acetaminophen-caffeine (FIORICET) 50-325-40 MG tablet Take 1 tablet by mouth every 6 (six) hours as needed for headache. Do not refill in less than 30 day 06/05/19   Crecencio Mc, MD  cloNIDine (CATAPRES - DOSED IN MG/24 HR) 0.1 mg/24hr patch Place 1 patch (0.1 mg total) onto the skin once a week for neuropathic pain. Monitor blood pressure 12/10/20     Continuous Blood Gluc Receiver (FREESTYLE LIBRE 2 READER) DEVI Use as directed 10/26/20   Renato Shin, MD  Continuous Blood Gluc Sensor (FREESTYLE LIBRE 2 SENSOR) MISC Inject 1 Device into the skin every 14 (fourteen) days. 10/26/20   Renato Shin, MD   diazepam (VALIUM) 2 MG tablet Take 1 tablet (2 mg total) by mouth every 6 (six) hours as needed (Headache). 08/07/19   Crecencio Mc, MD  dicyclomine (BENTYL) 20 MG tablet Take 1 tablet (20 mg total) by mouth every 6 (six) hours. 12/15/20   Thornton Park, MD  EPINEPHrine 0.3 mg/0.3 mL IJ SOAJ injection Inject 0.3 mg into the muscle as needed (for allergic reaction). 11/26/20   Crecencio Mc, MD  Fremanezumab-vfrm (AJOVY) 225 MG/1.5ML SOSY Inject 1.5 mLs into the skin every 30 (thirty) days. 12/22/20  Jaclyn Prime, Collene Leyden, PA-C  Fremanezumab-vfrm 225 MG/1.5ML SOSY INJECT 4.5 MLS INTO THE SKIN EVERY 3 (THREE) MONTHS 07/17/20 07/17/21  Jaclyn Prime, Collene Leyden, PA-C  glucose blood (FREESTYLE LITE) test strip 1 each by Other route 2 (two) times daily. E11.9 07/12/19   Renato Shin, MD  insulin lispro (HUMALOG) 100 UNIT/ML KwikPen Inject 22 Units into the skin 2 (two) times daily with a meal. 01/26/21   Renato Shin, MD  Insulin Pen Needle (UNIFINE PENTIPS) 32G X 4 MM MISC Use as directed two times a day 03/24/21   Renato Shin, MD  Lancets (FREESTYLE) lancets 1 each by Other route 2 (two) times daily. E11.9 07/12/19   Renato Shin, MD  losartan-hydrochlorothiazide [facility]) 50-12.5 MG tablet Take 1 tablet by mouth daily. 10/13/20   Crecencio Mc, MD  metFORMIN (GLUCOPHAGE-XR) 500 MG 24 hr tablet Take 1 tablet (500 mg total) by mouth daily. 10/26/20   Renato Shin, MD  metoprolol succinate (TOPROL-XL) 25 MG 24 hr tablet Take 0.5 tablets (12.5 mg total) by mouth daily. 12/16/20   End, Harrell Gave, MD  ondansetron (ZOFRAN ODT) 4 MG disintegrating tablet Take 1 tablet (4 mg total) by mouth every 8 (eight) hours as needed for nausea or vomiting. 09/13/19   Crecencio Mc, MD  ondansetron (ZOFRAN) 8 MG tablet Take 1 tablet (8 mg total) by mouth every 8 (eight) hours as needed for nausea or vomiting. 03/15/21   Raylene Everts, MD  OXcarbazepine ER 150 MG TB24 TAKE 1 TABLET BY MOUTH DAILY AT BEDTIME 06/15/20  06/15/21  Dalton-Bethea, Fabio Asa, MD  oxyCODONE-acetaminophen (PERCOCET) 7.5-325 MG tablet Take 1 tablet by mouth every 4 (four) hours as needed for severe pain. 03/15/21   Raylene Everts, MD  oxyCODONE-acetaminophen (PERCOCET/ROXICET) 5-325 MG tablet Take by mouth as needed for severe pain.    [provider]  pantoprazole (PROTONIX) 40 MG tablet Take 1 tablet (40 mg total) by mouth daily. Take only with water and take 30 minutes before eating. 10/06/20   Thornton Park, MD  Semaglutide, 2 MG/DOSE, (OZEMPIC, 2 MG/DOSE,) 8 MG/3ML SOPN Inject 2 mg into the skin once a week. 12/25/20   Renato Shin, MD    Family History Family History  Problem Relation Age of Onset   Diabetes Mother    Heart disease Mother 50   Hypertension Mother    Hyperlipidemia Mother    Heart failure Mother    Stroke Mother    Thyroid disease Mother    Depression Mother    Sleep apnea Mother    Obesity Mother    Colon polyps Mother    Cancer Father 53       Lung Cancer   Heart disease Father    Mental illness Sister        bipolar, substance abuse,  clean 2 yrs   Hypertension Sister    Cancer Paternal Grandmother 19       breast cancer   Breast cancer Paternal Grandmother    Diabetes Maternal Grandmother    Hypertension Maternal Grandmother    Diabetes Son    Colon cancer Neg Hx    Esophageal cancer Neg Hx    Rectal cancer Neg Hx    Stomach cancer Neg Hx     Social History Social History   Tobacco Use   Smoking status: Never   Smokeless tobacco: Never  Vaping Use   Vaping Use: Never used  Substance Use Topics   Alcohol use: No   Drug  use: No     Allergies   Bamlanivimab, Botox [onabotulinumtoxina], Clindamycin/lincomycin, Kiwi extract, Maxalt [rizatriptan benzoate], Nitrous oxide, Triptans, Aspirin, Contrast media [iodinated diagnostic agents], Erythromycin, Flagyl [metronidazole], Haemophilus influenzae vaccines, Latex, Mango flavor, Vicodin [hydrocodone-acetaminophen], Influenza  virus vaccine, Penicillins, and Tetracyclines & related   Review of Systems Review of Systems  Constitutional:  Negative for fever.  HENT:  Negative for congestion and facial swelling.   Eyes:  Positive for photophobia. Negative for pain, discharge and redness.  Respiratory:  Negative for cough.   Gastrointestinal:  Positive for nausea. Negative for vomiting.  Musculoskeletal:  Negative for gait problem.  Skin:  Negative for rash and wound.  Neurological:  Positive for numbness and headaches. Negative for dizziness, speech difficulty and weakness.    Physical Exam Triage Vital Signs ED Triage Vitals  Enc Vitals Group     BP 04/13/21 1019 (!) 142/93     Pulse Rate 04/13/21 1019 87     Resp --      Temp 04/13/21 1019 98 F (36.7 C)     Temp Source 04/13/21 1019 Oral     SpO2 04/13/21 1019 95 %     Weight --      Height --      Head Circumference --      Peak Flow --      Pain Score 04/13/21 1017 6     Pain Loc --      Pain Edu? --      Excl. in [place]? --    No data found.  Updated Vital Signs BP (!) 142/93 (BP Location: Left Arm)   Pulse 87   Temp 98 F (36.7 C) (Oral)   SpO2 95%   Visual Acuity Right Eye Distance:   Left Eye Distance:   Bilateral Distance:    Right Eye Near:   Left Eye Near:    Bilateral Near:     Physical Exam Physical Exam Vitals signs and nursing note reviewed.  Constitutional:      General: He is not in acute distress.    Appearance: He is well-developed and normal weight. He is not ill-appearing, toxic-appearing or diaphoretic.  HENT:     Head: Normocephalic.  Eyes:     Extraocular Movements: Extraocular movements intact.     Pupils: Pupils are equal, round, and reactive to light.  Neck:     Musculoskeletal: Neck supple. No neck rigidity.     Meningeal: Brudzinski's sign absent.  Cardiovascular:     Rate and Rhythm: Normal rate and regular rhythm.     Heart sounds: No murmur.  Pulmonary:     Effort: Pulmonary effort is normal.      Breath sounds: Normal breath sounds. No wheezing, rhonchi or rales.  Abdominal:     General: Bowel sounds are normal.     Palpations: Abdomen is soft. There is no mass.     Tenderness: There is no abdominal tenderness. There is no guarding.  Musculoskeletal: Normal range of motion.  Lymphadenopathy:     Cervical: No cervical adenopathy.  Skin:    General: Skin is warm and dry.  Neurological: Has no face symmetry, but has decreased sensation on her L face    Mental Status: He is alert.     Cranial Nerves: No cranial nerve deficit or facial asymmetry.     Sensory: No sensory deficit.     Motor: No weakness.     Coordination: Romberg sign negative. Coordination normal.     Gait:  Gait normal.     Deep Tendon Reflexes: Reflexes normal.     Comments: Normal Romberg, finger to nose, but poor tandem gait.  Psychiatric:        Mood and Affect: Mood normal.        Speech: Speech normal.        Behavior: Behavior normal.    UC Treatments / Results  Labs (all labs ordered are listed, but only abnormal results are displayed) Labs Reviewed - No data to display  EKG   Radiology No results found.  Procedures Procedures (including critical care time)  Medications Ordered in UC Medications  ondansetron (ZOFRAN-ODT) disintegrating tablet 4 mg (4 mg Oral Given 04/13/21 1054)  ketorolac (TORADOL) injection 60 mg (60 mg Intramuscular Given 04/13/21 1055)    Initial Impression / Assessment and Plan / UC Course  I have reviewed the triage vital signs and the nursing notes. She was given Zofran ODT 4 MG SL and Toradol 60 mg IM. Her pain went down only to 5/10 after 30 minutes, her face numbness was still present. Her tandem gait was improved.  She was sent to ER for further eval of L face numbness.     Final Clinical Impressions(s) / UC Diagnoses   Final diagnoses:  Paresthesias  Acute migraine     Discharge Instructions      Go to ER right now      ED Prescriptions    None    PDMP not reviewed this encounter.   Shelby Mattocks, PA-C 04/13/21 [place], [place], PA-C 04/13/21 1126

## 2021-04-13 NOTE — Discharge Instructions (Signed)
Go to ER right now  

## 2021-04-13 NOTE — ED Triage Notes (Signed)
Pt c/o migraine x 3 days with light sensitivity. She has some left side facial numbness that started yesterday.

## 2021-04-13 NOTE — Telephone Encounter (Signed)
-----   Message from Jari Pigg sent at 04/13/2021  9:01 AM EST ----- Regarding: migraine This pt stated that she is having severe migraines and that she left work yesterday because the side of her face was numb so shes wanting to reach [facility] for an emergency dose.

## 2021-04-13 NOTE — Telephone Encounter (Signed)
Left message for pt to call back regarding her headache.

## 2021-04-26 ENCOUNTER — Telehealth: Payer: Self-pay | Admitting: Neurology

## 2021-04-26 ENCOUNTER — Other Ambulatory Visit (HOSPITAL_COMMUNITY): Payer: Self-pay

## 2021-04-26 ENCOUNTER — Other Ambulatory Visit: Payer: Self-pay | Admitting: Gastroenterology

## 2021-04-26 ENCOUNTER — Other Ambulatory Visit: Payer: Self-pay | Admitting: Internal Medicine

## 2021-04-26 MED ORDER — DICYCLOMINE HCL 20 MG PO TABS
20.0000 mg | ORAL_TABLET | Freq: Four times a day (QID) | ORAL | 1 refills | Status: DC
  Filled 2021-04-26: qty 90, 23d supply, fill #0
  Filled 2022-02-02: qty 90, 23d supply, fill #1

## 2021-04-26 MED ORDER — LOSARTAN POTASSIUM-HCTZ 50-12.5 MG PO TABS
1.0000 | ORAL_TABLET | Freq: Every day | ORAL | 1 refills | Status: DC
  Filled 2021-04-26: qty 90, 90d supply, fill #0
  Filled 2021-10-12: qty 90, 90d supply, fill #1

## 2021-04-26 NOTE — Telephone Encounter (Signed)
Patient has been referred back to [place] for numbness and tingling in face related to migraines. She has seen Dr. Krista Blue in the past but is requesting to see Dr. Jaynee Eagles. Would you both be ok with this?

## 2021-04-26 NOTE — Telephone Encounter (Signed)
I'm fine with seeing the patient

## 2021-04-27 ENCOUNTER — Other Ambulatory Visit (HOSPITAL_COMMUNITY): Payer: Self-pay

## 2021-04-28 ENCOUNTER — Other Ambulatory Visit (HOSPITAL_COMMUNITY): Payer: Self-pay

## 2021-04-30 ENCOUNTER — Telehealth: Payer: 59 | Admitting: Nurse Practitioner

## 2021-04-30 ENCOUNTER — Encounter: Payer: Self-pay | Admitting: Nurse Practitioner

## 2021-04-30 DIAGNOSIS — J4521 Mild intermittent asthma with (acute) exacerbation: Secondary | ICD-10-CM

## 2021-04-30 MED ORDER — PREDNISONE 10 MG (21) PO TBPK: ORAL_TABLET | ORAL | 0 refills | Status: DC

## 2021-04-30 NOTE — Progress Notes (Signed)
Virtual Visit Consent   [name], you are scheduled for a virtual visit with a [facility] provider today.     Just as with appointments in the office, your consent must be obtained to participate.  Your consent will be active for this visit and any virtual visit you may have with one of our providers in the next 365 days.     If you have a MyChart account, a copy of this consent can be sent to you electronically.  All virtual visits are billed to your insurance company just like a traditional visit in the office.    As this is a virtual visit, video technology does not allow for your provider to perform a traditional examination.  This may limit your provider's ability to fully assess your condition.  If your provider identifies any concerns that need to be evaluated in person or the need to arrange testing (such as labs, EKG, etc.), we will make arrangements to do so.     Although advances in technology are sophisticated, we cannot ensure that it will always work on either your end or our end.  If the connection with a video visit is poor, the visit may have to be switched to a telephone visit.  With either a video or telephone visit, we are not always able to ensure that we have a secure connection.     I need to obtain your verbal consent now.   Are you willing to proceed with your visit today?    [name] has provided verbal consent on 04/30/2021 for a virtual visit (video or telephone).   Apolonio Schneiders, FNP   Date: 04/30/2021 9:29 AM   Virtual Visit via Video Note   I, Apolonio Schneiders, connected with  [name]  ([id number], 1971/05/07) on 04/30/21 at  9:30 AM EST by a video-enabled telemedicine application and verified that I am speaking with the correct person using two identifiers.  Location: Patient: Virtual Visit Location Patient: Home Provider: Virtual Visit Location Provider: Home Office   I discussed the limitations of evaluation and management by  telemedicine and the availability of in person appointments. The patient expressed understanding and agreed to proceed.    History of Present Illness: [name] is a 50 y.o. who identifies as a female who was assigned female at birth, and is being seen today for cough and congestion. Her cough is the most significant symptom at this time.   She has taken three COVID tests that are negative.  Denies body aches   She does have an Albuterol inhaler and she has been using it every 6 hours for the past two days. History of asthma   She is monitoring her SpO2 at home and has been between 92-95%  She has tolerated prednisone in the past  Problems:  Patient Active Problem List   Diagnosis Date Noted   Fatty infiltration of liver 07/26/2020   Diarrhea, functional 06/22/2020   Epigastric pain 06/22/2020   Uveitis, intermediate, bilateral 09/07/2019   Generalized edema 06/13/2019   Abnormal electrocardiogram (ECG) (EKG) 06/07/2019   Hospital discharge follow-up 06/07/2019   Postviral fatigue syndrome 05/30/2019   Other fatigue 01/23/2018   Shortness of breath 01/23/2018   Multinodular goiter 12/05/2017   Multiple thyroid nodules 12/05/2017   Microalbuminuria due to type 2 diabetes mellitus ([place]) 11/26/2017   Encounter for preventive health examination 11/26/2017   Menopause syndrome 06/10/2017   Adverse reaction to vaccine, sequela 03/25/2017  Palpitations 02/27/2017   PAC (premature atrial contraction) 02/27/2017   PVC's (premature ventricular contractions) 02/27/2017   PSVT (paroxysmal supraventricular tachycardia) ([place]) 11/26/2016   Nontraumatic incomplete tear of right rotator cuff 07/14/2016   AC (acromioclavicular) arthritis 07/14/2016   Impingement syndrome of right shoulder 07/14/2016   Fibrocystic breast changes, right 05/21/2016   Menopausal symptoms 10/30/2015   Insomnia 10/16/2015   Snoring 07/26/2015   Chronic migraine w/o aura w/o status migrainosus, not  intractable 02/24/2015   Muscle spasm 02/24/2015   History of concussion 07/21/2014   Hyperlipidemia associated with type 2 diabetes mellitus ([place]) 06/13/2014   Vitamin D deficiency 05/07/2013   Chronic migraine 11/20/2012   S/P Total Abdominal Hysterectomy and Left Salpingo-oophorectomy 10/20/2011   Headache around the eyes    Uncontrolled type 2 diabetes mellitus 07/20/2011   Obesity (BMI 30-39.9) 07/20/2011   Essential hypertension 07/20/2011    Allergies:  Allergies  Allergen Reactions   Bamlanivimab Anaphylaxis, Itching, Palpitations, Rash, Shortness Of Breath and Swelling   Botox [Onabotulinumtoxina] Shortness Of Breath    syncope   Clindamycin/Lincomycin Other (See Comments)   Kiwi Extract Shortness Of Breath   Maxalt [Rizatriptan Benzoate] Anaphylaxis    Chest pain   Nitrous Oxide Shortness Of Breath    syncope   Triptans Shortness Of Breath   Aspirin Nausea And Vomiting    Mouth blisters   Contrast Media [Iodinated Diagnostic Agents]    Erythromycin    Flagyl [Metronidazole]    Haemophilus Influenzae Vaccines    Latex     Sometimes causes rash   Mango Flavor    Vicodin [Hydrocodone-Acetaminophen]     hallucinations   Influenza Virus Vaccine Palpitations   Penicillins Nausea And Vomiting, Rash and Other (See Comments)    Did it involve swelling of the face/tongue/throat, SOB, or low BP? No Did it involve sudden or severe rash/hives, skin peeling, or any reaction on the inside of your mouth or nose? No Did you need to seek medical attention at a hospital or doctor's office? Yes When did it last happen?     patient was 50 years old  If all above answers are "NO", may proceed with cephalosporin use.   Tetracyclines & Related Nausea And Vomiting and Rash   Medications:  Current Outpatient Medications:    albuterol (VENTOLIN HFA) 108 (90 Base) MCG/ACT inhaler, Inhale 2 puffs into the lungs every 6 (six) hours as needed for wheezing or shortness of breath., Disp: 18 g,  Rfl: 0   ALPRAZolam (XANAX) 0.5 MG tablet, Take 1 tablet (0.5 mg total) by mouth 3 (three) times daily as needed (Headache)., Disp: , Rfl:    aspirin EC 81 MG tablet, Take 81 mg by mouth daily., Disp: , Rfl:    atorvastatin (LIPITOR) 20 MG tablet, Take 1 tablet (20 mg total) by mouth daily., Disp: 90 tablet, Rfl: 3   Blood Glucose Monitoring Suppl (FREESTYLE LITE) DEVI, 1 each by Does not apply route 2 (two) times daily. E11.9, Disp: 1 each, Rfl: 0   Blood Pressure Monitoring (OMRON 3 SERIES BP MONITOR) DEVI, Use as directed, Disp: 1 each, Rfl: 0   butalbital-acetaminophen-caffeine (FIORICET) 50-325-40 MG tablet, Take 1 tablet by mouth every 6 (six) hours as needed for headache. Do not refill in less than 30 day, Disp: 30 tablet, Rfl: 1   cloNIDine (CATAPRES - DOSED IN MG/24 HR) 0.1 mg/24hr patch, Place 1 patch (0.1 mg total) onto the skin once a week for neuropathic pain. Monitor blood pressure, Disp: 4  patch, Rfl: 5   Continuous Blood Gluc Receiver (FREESTYLE LIBRE 2 READER) DEVI, Use as directed, Disp: 1 each, Rfl: 1   Continuous Blood Gluc Sensor (FREESTYLE LIBRE 2 SENSOR) MISC, Inject 1 Device into the skin every 14 (fourteen) days., Disp: 6 each, Rfl: 3   diazepam (VALIUM) 2 MG tablet, Take 1 tablet (2 mg total) by mouth every 6 (six) hours as needed (Headache)., Disp: 30 tablet, Rfl: 5   dicyclomine (BENTYL) 20 MG tablet, Take 1 tablet (20 mg total) by mouth every 6 (six) hours., Disp: 90 tablet, Rfl: 1   EPINEPHrine 0.3 mg/0.3 mL IJ SOAJ injection, Inject 0.3 mg into the muscle as needed (for allergic reaction)., Disp: 2 each, Rfl: 3   Fremanezumab-vfrm (AJOVY) 225 MG/1.5ML SOSY, Inject 1.5 mLs into the skin every 30 (thirty) days., Disp: 1.5 mL, Rfl: 6   Fremanezumab-vfrm 225 MG/1.5ML SOSY, INJECT 4.5 MLS INTO THE SKIN EVERY 3 (THREE) MONTHS, Disp: 4.5 mL, Rfl: 4   glucose blood (FREESTYLE LITE) test strip, 1 each by Other route 2 (two) times daily. E11.9, Disp: 200 each, Rfl: 0   insulin  lispro (HUMALOG) 100 UNIT/ML KwikPen, Inject 22 Units into the skin 2 (two) times daily with a meal., Disp: 45 mL, Rfl: 3   Insulin Pen Needle (UNIFINE PENTIPS) 32G X 4 MM MISC, Use as directed two times a day, Disp: 100 each, Rfl: 4   Lancets (FREESTYLE) lancets, 1 each by Other route 2 (two) times daily. E11.9, Disp: 200 each, Rfl: 0   losartan-hydrochlorothiazide (HYZAAR) 50-12.5 MG tablet, Take 1 tablet by mouth daily., Disp: 90 tablet, Rfl: 1   metFORMIN (GLUCOPHAGE-XR) 500 MG 24 hr tablet, Take 1 tablet (500 mg total) by mouth daily., Disp: 90 tablet, Rfl: 3   metoprolol succinate (TOPROL-XL) 25 MG 24 hr tablet, Take 0.5 tablets (12.5 mg total) by mouth daily., Disp: 45 tablet, Rfl: 2   ondansetron (ZOFRAN ODT) 4 MG disintegrating tablet, Take 1 tablet (4 mg total) by mouth every 8 (eight) hours as needed for nausea or vomiting., Disp: 20 tablet, Rfl: 0   ondansetron (ZOFRAN) 8 MG tablet, Take 1 tablet (8 mg total) by mouth every 8 (eight) hours as needed for nausea or vomiting., Disp: 20 tablet, Rfl: 0   OXcarbazepine ER 150 MG TB24, TAKE 1 TABLET BY MOUTH DAILY AT BEDTIME, Disp: 30 tablet, Rfl: 5   oxyCODONE-acetaminophen (PERCOCET) 7.5-325 MG tablet, Take 1 tablet by mouth every 4 (four) hours as needed for severe pain., Disp: 15 tablet, Rfl: 0   oxyCODONE-acetaminophen (PERCOCET/ROXICET) 5-325 MG tablet, Take by mouth as needed for severe pain., Disp: , Rfl:    pantoprazole (PROTONIX) 40 MG tablet, Take 1 tablet (40 mg total) by mouth daily. Take only with water and take 30 minutes before eating., Disp: 90 tablet, Rfl: 3   Semaglutide, 2 MG/DOSE, (OZEMPIC, 2 MG/DOSE,) 8 MG/3ML SOPN, Inject 2 mg into the skin once a week., Disp: 9 mL, Rfl: 3  Observations/Objective: Patient is well-developed, well-nourished in no acute distress.  Resting comfortably  at home.  Head is normocephalic, atraumatic.  No labored breathing.  Speech is clear and coherent with logical content.  Patient is alert  and oriented at baseline.  Persistent cough present throughout conversation   Assessment and Plan: 1. Mild intermittent asthma with exacerbation  - predniSONE (STERAPRED UNI-PAK 21 TAB) 10 MG (21) TBPK tablet; Take 6 tablets on day one, 5 on day two, 4 on day three, 3 on day four, 2  on day five, and 1 on day six. Take with food.  Dispense: 21 tablet; Refill: 0    Continue inhaler use as directed  Humidifier in room  Avoid respiratory irritants   If PCR returns positive for COVID please follow up with provider  Follow Up Instructions: I discussed the assessment and treatment plan with the patient. The patient was provided an opportunity to ask questions and all were answered. The patient agreed with the plan and demonstrated an understanding of the instructions.  A copy of instructions were sent to the patient via MyChart unless otherwise noted below.     The patient was advised to call back or seek an in-person evaluation if the symptoms worsen or if the condition fails to improve as anticipated.  Time:  I spent 15 minutes with the patient via telehealth technology discussing the above problems/concerns.    Apolonio Schneiders, FNP

## 2021-05-03 ENCOUNTER — Other Ambulatory Visit: Payer: Self-pay

## 2021-05-03 ENCOUNTER — Ambulatory Visit (INDEPENDENT_AMBULATORY_CARE_PROVIDER_SITE_OTHER): Payer: 59 | Admitting: Endocrinology

## 2021-05-03 ENCOUNTER — Other Ambulatory Visit (HOSPITAL_COMMUNITY): Payer: Self-pay

## 2021-05-03 VITALS — BP 114/82 | HR 104 | Ht 66.0 in | Wt 214.0 lb

## 2021-05-03 DIAGNOSIS — E1165 Type 2 diabetes mellitus with hyperglycemia: Secondary | ICD-10-CM

## 2021-05-03 LAB — POCT GLYCOSYLATED HEMOGLOBIN (HGB A1C): Hemoglobin A1C: 7.6 % — AB (ref 4.0–5.6)

## 2021-05-03 MED ORDER — DEXCOM G6 SENSOR MISC
3 refills | Status: DC
  Filled 2021-05-03: qty 9, 30d supply, fill #0
  Filled 2021-05-26: qty 3, 30d supply, fill #0
  Filled 2021-06-07: qty 9, 90d supply, fill #0
  Filled 2021-06-18: qty 3, 30d supply, fill #0
  Filled 2021-07-16: qty 3, 30d supply, fill #1
  Filled 2021-08-24: qty 3, 30d supply, fill #2
  Filled 2021-09-24: qty 3, 30d supply, fill #3
  Filled 2021-11-04 – 2021-11-24 (×3): qty 3, 30d supply, fill #4
  Filled 2021-12-20: qty 3, 30d supply, fill #5

## 2021-05-03 MED ORDER — DEXCOM G6 TRANSMITTER MISC
1 refills | Status: DC
  Filled 2021-05-03 – 2021-06-18 (×4): qty 1, 90d supply, fill #0
  Filled 2021-09-24: qty 1, 90d supply, fill #1

## 2021-05-03 MED ORDER — DEXCOM G6 RECEIVER DEVI
1 refills | Status: DC
  Filled 2021-05-03: qty 1, 90d supply, fill #0

## 2021-05-03 NOTE — Progress Notes (Signed)
Subjective:    Patient ID: [name], female    DOB: 21-Oct-1970, 50 y.o.   MRN: [id number]  HPI Pt returns for f/u of diabetes mellitus: DM type: Insulin-requiring type 2 Dx'ed: 9371 Complications: DN Therapy: insulin since 2018, Ozempic, and metformin.   GDM: 1990 and 2004 DKA: never Severe hypoglycemia: never Pancreatitis: never Pancreatic imaging: normal on 2019 CT.  SDOH: none Other: She did not tolerate Jardiance (vaginitis), metformin (diarrhea), or Victoza (dysphagia); she takes multiple daily injections (but she takes humalog BID, as she eats 2 meals per day); she works 1st shift.   Interval history: no recent steroids.  Pt says cbg's vary from 65-160.  It is in general lowest in the middle of the night.  Pt says continuous glucose monitor is telling her to change sensor daily.  It also sometimes falls off.  She takes Humalog 20 units twice a day (just before each meal).  She started prednisone 3 days ago, for asthma.  This increased glucose to 300.  Prior to prednisone glucoses were in the mid-100's.   Past Medical History:  Diagnosis Date   Allergy    Anemia    Anxiety    claustrophobic   Asthma    Back pain    Biceps tendonosis of right shoulder    Cataract    Mixed OU   COVID-19 2020   covid PNA   Diabetes mellitus 2011   did not start metforfin, losing weight   Dyspnea    Fatty liver    Food allergy    Headache disorder    History of concussion    HLD (hyperlipidemia)    Hypertension    Hypertensive retinopathy    OU   IBS (irritable bowel syndrome)    Infertility, female    Joint pain    Lactose intolerance    Leg edema    Migraines    Palpitation    Post-menopausal    Seborrheic dermatitis    Shoulder impingement syndrome, right    Vitamin D deficiency     Past Surgical History:  Procedure Laterality Date   ABDOMINAL HYSTERECTOMY  2006   heavy menses, endometriosis, l oophrectomy   BACK SURGERY     BREAST BIOPSY Right 2018   benign    BREAST EXCISIONAL BIOPSY Right 2003   benign   BREAST EXCISIONAL BIOPSY Right 1999   benign   BREAST SURGERY     right breast x 2 , benign   COLONOSCOPY  10/06/2020   HERNIA REPAIR  2003   left inguinal    LEFT OOPHORECTOMY     SHOULDER ARTHROSCOPY WITH SUBACROMIAL DECOMPRESSION AND OPEN ROTATOR C Right 07/14/2016   Procedure: RIGHT SHOULDER ARTHROSCOPY WITH SUBACROMIAL DECOMPRESSION, DISTAL CLAVICLE RESECTION AND MINI OPEN ROTATOR CUFF REPAIR, OPEN BICEP TENDODESIS;  Surgeon: Garald Balding, MD;  Location: [facility];  Service: Orthopedics;  Laterality: Right;   SHOULDER CLOSED REDUCTION Right 09/08/2016   Procedure: RIGHT CLOSED MANIPULATION SHOULDER;  Surgeon: Garald Balding, MD;  Location: [facility];  Service: Orthopedics;  Laterality: Right;   TUBAL LIGATION     UPPER GASTROINTESTINAL ENDOSCOPY  10/06/2020    Social History   Socioeconomic History   Marital status: Divorced    Spouse name: Not on file   Number of children: 2   Years of education: 14   Highest education level: Master's degree (e.g., MA, MS, MEng, MEd, MSW, MBA)  Occupational History   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   Right-handed.   1-3 cups caffeine weekly.   Social Determinants of Health   Financial Resource Strain: Not on file  Food Insecurity: Not on file  Transportation Needs: Not on file  Physical Activity: Not on file  Stress: Not on file  Social Connections: Not on file  Intimate Partner Violence: Not on file    Current Outpatient Medications on File Prior to Visit  Medication  Sig Dispense Refill   albuterol (VENTOLIN HFA) 108 (90 Base) MCG/ACT inhaler Inhale 2 puffs into the lungs every 6 (six) hours as needed for wheezing or shortness of breath. 18 g 0   ALPRAZolam (XANAX) 0.5 MG tablet Take 1 tablet (0.5 mg total) by mouth 3 (three) times daily as needed (Headache).     aspirin EC 81 MG tablet Take 81 mg by mouth daily.     atorvastatin (LIPITOR) 20 MG tablet Take 1 tablet (20 mg total) by mouth daily. 90 tablet 3   baclofen (LIORESAL) 10 MG tablet baclofen 10 mg tablet     Blood Glucose Monitoring Suppl (FREESTYLE LITE) DEVI 1 each by Does not apply route 2 (two) times daily. E11.9 1 each 0   Blood Pressure Monitoring (OMRON 3 SERIES BP MONITOR) DEVI Use as directed 1 each 0   butalbital-acetaminophen-caffeine (FIORICET) 50-325-40 MG tablet Take 1 tablet by mouth every 6 (six) hours as needed for headache. Do not refill in less than 30 day 30 tablet 1   diazepam (VALIUM) 2 MG tablet Take 1 tablet (2 mg total) by mouth every 6 (six) hours as needed (Headache). 30 tablet 5   dicyclomine (BENTYL) 20 MG tablet Take 1 tablet (20 mg total) by mouth every 6 (six) hours. 90 tablet 1   EPINEPHrine 0.3 mg/0.3 mL IJ SOAJ injection Inject 0.3 mg into the muscle as needed (for allergic reaction). 2 each 3   Fremanezumab-vfrm (AJOVY) 225 MG/1.5ML SOSY Inject 1.5 mLs into the skin every 30 (thirty) days. 1.5 mL 6   glucose blood (FREESTYLE LITE) test strip 1 each by Other route 2 (two) times daily. E11.9 200 each 0   insulin lispro (HUMALOG) 100 UNIT/ML KwikPen Inject 22 Units into the skin 2 (two) times daily with a meal. 45 mL 3   Insulin Pen Needle (UNIFINE PENTIPS) 32G X 4 MM MISC Use as directed two times a day 100 each 4   Lancets (FREESTYLE) lancets 1 each by Other route 2 (two) times daily. E11.9 200 each 0   losartan-hydrochlorothiazide (HYZAAR) 50-12.5 MG tablet Take 1 tablet by mouth daily. 90 tablet 1   metoprolol succinate (TOPROL-XL) 25 MG 24 hr tablet Take 0.5  tablets (12.5 mg total) by mouth daily. 45 tablet 2   ondansetron (ZOFRAN ODT) 4 MG disintegrating tablet Take 1 tablet (4 mg total) by mouth every 8 (eight) hours as needed for nausea or vomiting. 20 tablet 0   ondansetron (ZOFRAN) 8 MG tablet Take 1 tablet (8 mg total) by mouth every 8 (eight) hours as needed for nausea or  vomiting. 20 tablet 0   OXcarbazepine ER 150 MG TB24 TAKE 1 TABLET BY MOUTH DAILY AT BEDTIME 30 tablet 5   oxyCODONE-acetaminophen (PERCOCET) 7.5-325 MG tablet Take 1 tablet by mouth every 4 (four) hours as needed for severe pain. 15 tablet 0   oxyCODONE-acetaminophen (PERCOCET/ROXICET) 5-325 MG tablet Take by mouth as needed for severe pain.     pantoprazole (PROTONIX) 40 MG tablet Take 1 tablet (40 mg total) by mouth daily. Take only with water and take 30 minutes before eating. 90 tablet 3   predniSONE (STERAPRED UNI-PAK 21 TAB) 10 MG (21) TBPK tablet Take 6 tablets on day one, 5 on day two, 4 on day three, 3 on day four, 2 on day five, and 1 on day six. Take with food. 21 tablet 0   Semaglutide, 2 MG/DOSE, (OZEMPIC, 2 MG/DOSE,) 8 MG/3ML SOPN Inject 2 mg into the skin once a week. 9 mL 3   cloNIDine (CATAPRES - DOSED IN MG/24 HR) 0.1 mg/24hr patch Place 1 patch (0.1 mg total) onto the skin once a week for neuropathic pain. Monitor blood pressure 4 patch 5   diltiazem (CARDIZEM) 30 MG tablet      Fremanezumab-vfrm 225 MG/1.5ML SOSY INJECT 4.5 MLS INTO THE SKIN EVERY 3 (THREE) MONTHS 4.5 mL 4   hydrochlorothiazide (MICROZIDE) 12.5 MG capsule 12.5 mg     insulin degludec (TRESIBA FLEXTOUCH) 100 UNIT/ML FlexTouch Pen      metFORMIN (GLUCOPHAGE-XR) 500 MG 24 hr tablet Take 1 tablet (500 mg total) by mouth daily. 90 tablet 3   No current facility-administered medications on file prior to visit.    Allergies  Allergen Reactions   Bamlanivimab Anaphylaxis, Itching, Palpitations, Rash, Shortness Of Breath and Swelling   Botox [Onabotulinumtoxina] Shortness Of Breath    syncope    Clindamycin/Lincomycin Other (See Comments)   Kiwi Extract Shortness Of Breath   Maxalt [Rizatriptan Benzoate] Anaphylaxis    Chest pain   Nitrous Oxide Shortness Of Breath    syncope   Triptans Shortness Of Breath   Aspirin Nausea And Vomiting    Mouth blisters   Contrast Media [Iodinated Diagnostic Agents]    Erythromycin    Flagyl [Metronidazole]    Haemophilus Influenzae Vaccines    Latex     Sometimes causes rash   Mango Flavor    Vicodin [Hydrocodone-Acetaminophen]     hallucinations   Influenza Virus Vaccine Palpitations   Penicillins Nausea And Vomiting, Rash and Other (See Comments)    Did it involve swelling of the face/tongue/throat, SOB, or low BP? No Did it involve sudden or severe rash/hives, skin peeling, or any reaction on the inside of your mouth or nose? No Did you need to seek medical attention at a hospital or doctor's office? Yes When did it last happen?     patient was 50 years old  If all above answers are "NO", may proceed with cephalosporin use.   Tetracyclines & Related Nausea And Vomiting and Rash    Family History  Problem Relation Age of Onset   Diabetes Mother    Heart disease Mother 58   Hypertension Mother    Hyperlipidemia Mother    Heart failure Mother    Stroke Mother    Thyroid disease Mother    Depression Mother    Sleep apnea Mother    Obesity Mother    Colon polyps Mother    Cancer Father 53       Lung Cancer   Heart disease Father  Mental illness Sister        bipolar, substance abuse,  clean 2 yrs   Hypertension Sister    Cancer Paternal Grandmother 49       breast cancer   Breast cancer Paternal Grandmother    Diabetes Maternal Grandmother    Hypertension Maternal Grandmother    Diabetes Son    Colon cancer Neg Hx    Esophageal cancer Neg Hx    Rectal cancer Neg Hx    Stomach cancer Neg Hx     BP 114/82   Pulse (!) 104   Ht 5\' 6"  (1.676 m)   Wt 214 lb (97.1 kg)   SpO2 99%   BMI 34.54 kg/m    Review of  Systems     Objective:   Physical Exam    Lab Results  Component Value Date   HGBA1C 7.6 (A) 05/03/2021      Assessment & Plan:  Insulin-requiring type 2 DM. Uncontrolled, prob due to prednisone Device malfunction. Hypoglycemia, due to insulin: this limits aggressiveness of glycemic control  Patient Instructions  I have sent a prescription to your pharmacy, for the dexcom continuous glucose monitor Please continue the same 3 diabetes meds.   While on the prednisone, you should take extra Humalog: 4 extra units if in the 200's, and 8 extra if over 300.   Try applying benzoin to the skin, to keep the dexcom sensor from falling off.   check your blood sugar twice a day.  vary the time of day when you check, between before the 3 meals, and at bedtime.  also check if you have symptoms of your blood sugar being too high or too low.  please keep a record of the readings and bring it to your next appointment here (or you can bring the meter itself).  You can write it on any piece of paper.  please call us sooner if your blood sugar goes below 70, or if you have a lot of readings over 200.   Please come back for a follow-up appointment in approx 2-3 months.

## 2021-05-03 NOTE — Patient Instructions (Addendum)
I have sent a prescription to your pharmacy, for the dexcom continuous glucose monitor Please continue the same 3 diabetes meds.   While on the prednisone, you should take extra Humalog: 4 extra units if in the 200's, and 8 extra if over 300.   Try applying benzoin to the skin, to keep the dexcom sensor from falling off.   check your blood sugar twice a day.  vary the time of day when you check, between before the 3 meals, and at bedtime.  also check if you have symptoms of your blood sugar being too high or too low.  please keep a record of the readings and bring it to your next appointment here (or you can bring the meter itself).  You can write it on any piece of paper.  please call us sooner if your blood sugar goes below 70, or if you have a lot of readings over 200.   Please come back for a follow-up appointment in approx 2-3 months.

## 2021-05-05 ENCOUNTER — Other Ambulatory Visit: Payer: Self-pay

## 2021-05-05 ENCOUNTER — Ambulatory Visit: Payer: 59 | Admitting: Psychiatry

## 2021-05-05 ENCOUNTER — Other Ambulatory Visit (HOSPITAL_COMMUNITY): Payer: Self-pay

## 2021-05-05 ENCOUNTER — Telehealth: Payer: Self-pay | Admitting: Pharmacy Technician

## 2021-05-05 ENCOUNTER — Encounter: Payer: Self-pay | Admitting: Psychiatry

## 2021-05-05 VITALS — BP 127/89 | HR 85 | Ht 66.0 in | Wt 214.0 lb

## 2021-05-05 DIAGNOSIS — R200 Anesthesia of skin: Secondary | ICD-10-CM | POA: Diagnosis not present

## 2021-05-05 DIAGNOSIS — M542 Cervicalgia: Secondary | ICD-10-CM

## 2021-05-05 DIAGNOSIS — R202 Paresthesia of skin: Secondary | ICD-10-CM

## 2021-05-05 DIAGNOSIS — G43119 Migraine with aura, intractable, without status migrainosus: Secondary | ICD-10-CM
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
## 2021-05-05 NOTE — Telephone Encounter (Signed)
Patient Advocate Encounter  Received notification from [place] that prior authorization for [name] is required.   PA NOT NEEDED RAN TEST CLAIM #30 DAYS $64.33   [name] will continue to follow  [name], CPhT [name] Phone: [phone] Fax:  [phone]

## 2021-05-05 NOTE — Patient Instructions (Signed)
Start Vyepti infusions for migraine prevention Referral to neck PT MRI brain

## 2021-05-06 ENCOUNTER — Telehealth: Payer: Self-pay

## 2021-05-06 NOTE — Telephone Encounter (Signed)
PA for vyepti has been sent to [employer] via cover my meds.    Key: [id number] - PA Case ID: [id number]  [employer] is reviewing your PA request. You may close this dialog, return to your dashboard, and perform other tasks.  To check for an update later, open this request again from your dashboard. If [employer] has not replied within 24 hours for urgent requests or within 48 hours for standard requests, please contact [employer] at [phone].

## 2021-05-10 NOTE — Telephone Encounter (Signed)
Vyepti PA has been approved from [place] 05/06/2021-11/03/2021.  PA approval has been faxed to [facility] # [phone] confirmation received.  Pt notified of approval and to sign up for co pay assistance card.

## 2021-05-12 ENCOUNTER — Telehealth: Payer: Self-pay | Admitting: Psychiatry

## 2021-05-12 DIAGNOSIS — G43709 Chronic migraine without aura, not intractable, without status migrainosus: Secondary | ICD-10-CM

## 2021-05-12 NOTE — Telephone Encounter (Signed)
Order signed, thanks.

## 2021-05-12 NOTE — Telephone Encounter (Signed)
I spoke with the patient and she informed me she has had a severe reaction to the contrast, to the point she pasted out in the hospital and they wrote no iv contrast on her hand. Please switch the order to MRI Brain without contrast. Thank you.

## 2021-05-13 NOTE — Telephone Encounter (Signed)
Noted, thank you I left a voicemail for the patient to call back to schedule.   [facility] Auth: [place] ref # [id number].

## 2021-05-17 NOTE — Telephone Encounter (Signed)
Patient called back and she is scheduled at [facility] for 05/26/21.  She stated she is claustrophobic and would like to have something to help her. She is aware to have a driver.

## 2021-05-17 NOTE — Telephone Encounter (Signed)
Can hold until Dr. Billey Gosling returns to the office on 05/18/2021 since MRI is not until 05/26/2021.

## 2021-05-25 ENCOUNTER — Other Ambulatory Visit: Payer: Self-pay

## 2021-05-25 ENCOUNTER — Ambulatory Visit (INDEPENDENT_AMBULATORY_CARE_PROVIDER_SITE_OTHER): Payer: 59 | Admitting: Obstetrics & Gynecology

## 2021-05-25 ENCOUNTER — Other Ambulatory Visit (HOSPITAL_COMMUNITY)
Admission: RE | Admit: 2021-05-25 | Discharge: 2021-05-25 | Disposition: A | Discharge status: Home / Self Care | Payer: 59 | Source: Ambulatory Visit | Attending: Obstetrics & Gynecology | Admitting: Obstetrics & Gynecology

## 2021-05-25 ENCOUNTER — Encounter: Payer: Self-pay | Admitting: Obstetrics & Gynecology

## 2021-05-25 VITALS — BP 148/85 | HR 82 | Wt 216.0 lb

## 2021-05-25 DIAGNOSIS — Z113 Encounter for screening for infections with a predominantly sexual mode of transmission: Secondary | ICD-10-CM

## 2021-05-25 DIAGNOSIS — Z1231 Encounter for screening mammogram for malignant neoplasm of breast: Secondary | ICD-10-CM

## 2021-05-25 DIAGNOSIS — Z01419 Encounter for gynecological examination (general) (routine) without abnormal findings: Secondary | ICD-10-CM | POA: Diagnosis not present

## 2021-05-25 NOTE — Progress Notes (Signed)
GYNECOLOGY ANNUAL PREVENTATIVE CARE ENCOUNTER NOTE  History:     [name] is a 50 y.o. [phone] female here for a routine annual gynecologic exam.  She is s/p TAH/LSO for pelvic pain and AUB in 2006. Current complaints: none.   Denies abnormal vaginal bleeding, discharge, pelvic pain, problems with intercourse or other gynecologic concerns.    Gynecologic History No LMP recorded. Patient has had a hysterectomy. Last mammogram: 06/19/2020. Results were: normal.  Obstetric History OB History  Gravida Para Term Preterm AB Living  3 2 2  0 1 0  SAB IAB Ectopic Multiple Live Births  0 0 1 0 2    # Outcome Date GA Lbr Len/2nd Weight Sex Delivery Anes PTL Lv  3 Term           2 Term           1 Ectopic             Past Medical History:  Diagnosis Date   Allergy    Anemia    Anxiety    claustrophobic   Asthma    Back pain    Biceps tendonosis of right shoulder    Cataract    Mixed OU   COVID-19 2020   covid PNA   Diabetes mellitus 2011   did not start metforfin, losing weight   Dyspnea    Fatty liver    Food allergy    Headache disorder    History of concussion    HLD (hyperlipidemia)    Hypertension    Hypertensive retinopathy    OU   IBS (irritable bowel syndrome)    Infertility, female    Joint pain    Lactose intolerance    Leg edema    Migraines    Palpitation    Post-menopausal    Seborrheic dermatitis    Shoulder impingement syndrome, right    Vitamin D deficiency     Past Surgical History:  Procedure Laterality Date   ABDOMINAL HYSTERECTOMY  2006   heavy menses, endometriosis, l oophrectomy   BACK SURGERY     BREAST BIOPSY Right 2018   benign   BREAST EXCISIONAL BIOPSY Right 2003   benign   BREAST EXCISIONAL BIOPSY Right 1999   benign   BREAST SURGERY     right breast x 2 , benign   COLONOSCOPY  10/06/2020   HERNIA REPAIR  2003   left inguinal    LEFT OOPHORECTOMY     SHOULDER ARTHROSCOPY WITH SUBACROMIAL DECOMPRESSION AND OPEN  ROTATOR C Right 07/14/2016   Procedure: RIGHT SHOULDER ARTHROSCOPY WITH SUBACROMIAL DECOMPRESSION, DISTAL CLAVICLE RESECTION AND MINI OPEN ROTATOR CUFF REPAIR, OPEN BICEP TENDODESIS;  Surgeon: Garald Balding, MD;  Location: [facility];  Service: Orthopedics;  Laterality: Right;   SHOULDER CLOSED REDUCTION Right 09/08/2016   Procedure: RIGHT CLOSED MANIPULATION SHOULDER;  Surgeon: Garald Balding, MD;  Location: [facility];  Service: Orthopedics;  Laterality: Right;   TUBAL LIGATION     UPPER GASTROINTESTINAL ENDOSCOPY  10/06/2020    Current Outpatient Medications on File Prior to Visit  Medication Sig Dispense Refill   albuterol (VENTOLIN HFA) 108 (90 Base) MCG/ACT inhaler Inhale 2 puffs into the lungs every 6 (six) hours as needed for wheezing or shortness of breath. 18 g 0   ALPRAZolam (XANAX) 0.5 MG tablet Take 1 tablet (0.5 mg total) by mouth 3 (three) times daily as needed (Headache).     aspirin EC 81  MG tablet Take 81 mg by mouth daily.     atorvastatin (LIPITOR) 20 MG tablet Take 1 tablet (20 mg total) by mouth daily. 90 tablet 3   baclofen (LIORESAL) 10 MG tablet baclofen 10 mg tablet     Blood Glucose Monitoring Suppl (FREESTYLE LITE) DEVI 1 each by Does not apply route 2 (two) times daily. E11.9 1 each 0   Blood Pressure Monitoring (OMRON 3 SERIES BP MONITOR) DEVI Use as directed 1 each 0   butalbital-acetaminophen-caffeine (FIORICET) 50-325-40 MG tablet Take 1 tablet by mouth every 6 (six) hours as needed for headache. Do not refill in less than 30 day 30 tablet 1   Continuous Blood Gluc Receiver (DEXCOM G6 RECEIVER) DEVI Use as directed. 1 each 1   Continuous Blood Gluc Sensor (DEXCOM G6 SENSOR) MISC Use as directed. Change every 10 days. 9 each 3   Continuous Blood Gluc Transmit (DEXCOM G6 TRANSMITTER) MISC Use as directed every 90 days. 1 each 1   diazepam (VALIUM) 2 MG tablet Take 1 tablet (2 mg total) by mouth every 6 (six) hours as needed  (Headache). 30 tablet 5   dicyclomine (BENTYL) 20 MG tablet Take 1 tablet (20 mg total) by mouth every 6 (six) hours. 90 tablet 1   EPINEPHrine 0.3 mg/0.3 mL IJ SOAJ injection Inject 0.3 mg into the muscle as needed (for allergic reaction). 2 each 3   Fremanezumab-vfrm (AJOVY) 225 MG/1.5ML SOSY Inject 1.5 mLs into the skin every 30 (thirty) days. 1.5 mL 6   glucose blood (FREESTYLE LITE) test strip 1 each by Other route 2 (two) times daily. E11.9 200 each 0   insulin lispro (HUMALOG) 100 UNIT/ML KwikPen Inject 22 Units into the skin 2 (two) times daily with a meal. 45 mL 3   Insulin Pen Needle (UNIFINE PENTIPS) 32G X 4 MM MISC Use as directed two times a day 100 each 4   Lancets (FREESTYLE) lancets 1 each by Other route 2 (two) times daily. E11.9 200 each 0   losartan-hydrochlorothiazide (HYZAAR) 50-12.5 MG tablet Take 1 tablet by mouth daily. 90 tablet 1   metoprolol succinate (TOPROL-XL) 25 MG 24 hr tablet Take 0.5 tablets (12.5 mg total) by mouth daily. 45 tablet 2   ondansetron (ZOFRAN ODT) 4 MG disintegrating tablet Take 1 tablet (4 mg total) by mouth every 8 (eight) hours as needed for nausea or vomiting. 20 tablet 0   ondansetron (ZOFRAN) 8 MG tablet Take 1 tablet (8 mg total) by mouth every 8 (eight) hours as needed for nausea or vomiting. 20 tablet 0   OXcarbazepine ER 150 MG TB24 TAKE 1 TABLET BY MOUTH DAILY AT BEDTIME 30 tablet 5   oxyCODONE-acetaminophen (PERCOCET) 7.5-325 MG tablet Take 1 tablet by mouth every 4 (four) hours as needed for severe pain. 15 tablet 0   oxyCODONE-acetaminophen (PERCOCET/ROXICET) 5-325 MG tablet Take by mouth as needed for severe pain.     pantoprazole (PROTONIX) 40 MG tablet Take 1 tablet (40 mg total) by mouth daily. Take only with water and take 30 minutes before eating. 90 tablet 3   predniSONE (STERAPRED UNI-PAK 21 TAB) 10 MG (21) TBPK tablet Take 6 tablets on day one, 5 on day two, 4 on day three, 3 on day four, 2 on day five, and 1 on day six. Take with  food. 21 tablet 0   Semaglutide, 2 MG/DOSE, (OZEMPIC, 2 MG/DOSE,) 8 MG/3ML SOPN Inject 2 mg into the skin once a week. 9 mL 3  No current facility-administered medications on file prior to visit.    Allergies  Allergen Reactions   Bamlanivimab Anaphylaxis, Itching, Palpitations, Rash, Shortness Of Breath and Swelling   Botox [Onabotulinumtoxina] Shortness Of Breath    syncope   Clindamycin/Lincomycin Other (See Comments)   Kiwi Extract Shortness Of Breath   Maxalt [Rizatriptan Benzoate] Anaphylaxis    Chest pain   Nitrous Oxide Shortness Of Breath    syncope   Rizatriptan Benzoate Anaphylaxis    Chest pain   Triptans Shortness Of Breath   Aspirin Nausea And Vomiting    Mouth blisters   Contrast Media [Iodinated Diagnostic Agents]    Erythromycin    Flagyl [Metronidazole]    Haemophilus Influenzae Vaccines    Latex     Sometimes causes rash   Mango Flavor    Vicodin [Hydrocodone-Acetaminophen]     hallucinations   Influenza Virus Vaccine Palpitations   Penicillins Nausea And Vomiting, Rash and Other (See Comments)    Did it involve swelling of the face/tongue/throat, SOB, or low BP? No Did it involve sudden or severe rash/hives, skin peeling, or any reaction on the inside of your mouth or nose? No Did you need to seek medical attention at a hospital or doctor's office? Yes When did it last happen?     patient was 50 years old  If all above answers are NO, may proceed with cephalosporin use.   Tetracyclines & Related Nausea And Vomiting and Rash    Social History:  reports that she has never smoked. She has never used smokeless tobacco. She reports that she does not drink alcohol and does not use drugs.  Family History  Problem Relation Age of Onset   Diabetes Mother    Heart disease Mother 53   Hypertension Mother    Hyperlipidemia Mother    Heart failure Mother    Stroke Mother    Thyroid disease Mother    Depression Mother    Sleep apnea Mother    Obesity  Mother    Colon polyps Mother    Cancer Father 70       Lung Cancer   Heart disease Father    Mental illness Sister        bipolar, substance abuse,  clean 2 yrs   Hypertension Sister    Cancer Paternal Grandmother 19       breast cancer   Breast cancer Paternal Grandmother    Diabetes Maternal Grandmother    Hypertension Maternal Grandmother    Diabetes Son    Colon cancer Neg Hx    Esophageal cancer Neg Hx    Rectal cancer Neg Hx    Stomach cancer Neg Hx     The following portions of the patient's history were reviewed and updated as appropriate: allergies, current medications, past family history, past medical history, past social history, past surgical history and problem list.  Review of Systems Pertinent items noted in HPI and remainder of comprehensive ROS otherwise negative.  Physical Exam:  BP (!) 148/85    Pulse 82    Wt 216 lb (98 kg)    BMI 34.86 kg/m  CONSTITUTIONAL: Well-developed, well-nourished female in no acute distress.  HENT:  Normocephalic, atraumatic, External right and left ear normal.  EYES: Conjunctivae and EOM are normal. Pupils are equal, round, and reactive to light. No scleral icterus.  NECK: Normal range of motion, supple, no masses.  Normal thyroid.  SKIN: Skin is warm and dry. No rash noted. Not diaphoretic. No  erythema. No pallor. MUSCULOSKELETAL: Normal range of motion. No tenderness.  No cyanosis, clubbing, or edema.   NEUROLOGIC: Alert and oriented to person, place, and time. Normal reflexes, muscle tone coordination.  PSYCHIATRIC: Normal mood and affect. Normal behavior. Normal judgment and thought content. CARDIOVASCULAR: Normal heart rate noted, regular rhythm RESPIRATORY: Clear to auscultation bilaterally. Effort and breath sounds normal, no problems with respiration noted. BREASTS: Symmetric in size. No masses, tenderness, skin changes, nipple drainage, or lymphadenopathy bilaterally. Performed in the presence of a chaperone. ABDOMEN:  Soft, no distention noted.  No tenderness, rebound or guarding.  PELVIC: Normal appearing external genitalia and urethral meatus; normal appearing vaginal mucosa and cuff. No irritation noted on vulva or vagina. Scant white discharge noted, sample obtained.  No adnexal tenderness.  Performed in the presence of a chaperone.   Assessment and Plan:      1. Routine screening for STI (sexually transmitted infection) Desires annual STI check. Labs drawn, will follow up results and manage accordingly. - Cervicovaginal ancillary only - RPR+HBsAg+HCVAb+HIV  2. Breast cancer screening by mammogram Mammogram scheduled  - MM 3D SCREEN BREAST BILATERAL; Future  3. Well woman exam with routine gynecological exam Normal exam today.   Routine preventative health maintenance measures emphasized. Please refer to After Visit Summary for other counseling recommendations.      Verita Schneiders, MD, [place] for [employer], [place]

## 2021-05-26 ENCOUNTER — Other Ambulatory Visit (HOSPITAL_COMMUNITY): Payer: Self-pay

## 2021-05-26 ENCOUNTER — Ambulatory Visit: Payer: 59

## 2021-05-26 DIAGNOSIS — G43709 Chronic migraine without aura, not intractable, without status migrainosus: Secondary | ICD-10-CM

## 2021-05-26 LAB — RPR+HBSAG+HCVAB+...
HIV Screen 4th Generation wRfx: NONREACTIVE
Hep C Virus Ab: <0.1 s/co ratio (ref 0.0–0.9)
Hepatitis B Surface Ag: NEGATIVE
RPR Ser Ql: NONREACTIVE

## 2021-05-26 MED ORDER — ALPRAZOLAM 0.5 MG PO TABS
ORAL_TABLET | ORAL | 0 refills | Status: DC
  Filled 2021-05-26: qty 3, 1d supply, fill #0

## 2021-05-26 NOTE — Addendum Note (Signed)
Addended by: Verlin Grills on: 05/26/2021 11:39 AM   Modules accepted: Orders

## 2021-05-26 NOTE — Addendum Note (Signed)
Addended by: Genia Harold on: 05/26/2021 11:55 AM   Modules accepted: Orders

## 2021-05-27 LAB — CERVICOVAGINAL ANCILLARY ONLY
Chlamydia: NEGATIVE
Comment: NEGATIVE
Comment: NEGATIVE
Comment: NORMAL
Neisseria Gonorrhea: NEGATIVE
Trichomonas: NEGATIVE

## 2021-05-27 NOTE — Telephone Encounter (Signed)
Pt has been scheduled for infusion on 06/03/2021.

## 2021-06-02 ENCOUNTER — Other Ambulatory Visit (HOSPITAL_COMMUNITY): Payer: Self-pay | Admitting: *Deleted

## 2021-06-02 ENCOUNTER — Other Ambulatory Visit: Payer: Self-pay | Admitting: Internal Medicine

## 2021-06-02 DIAGNOSIS — Z1231 Encounter for screening mammogram for malignant neoplasm of breast: Secondary | ICD-10-CM

## 2021-06-03 ENCOUNTER — Inpatient Hospital Stay (HOSPITAL_COMMUNITY)
Admission: RE | Admit: 2021-06-03 | Discharge: 2021-06-03 | Disposition: A | Discharge status: Home / Self Care | Payer: 59 | Source: Ambulatory Visit | Attending: Psychiatry | Admitting: Psychiatry

## 2021-06-07 ENCOUNTER — Telehealth: Payer: 59 | Admitting: Physician Assistant

## 2021-06-07 ENCOUNTER — Ambulatory Visit (HOSPITAL_COMMUNITY)
Admission: RE | Admit: 2021-06-07 | Discharge: 2021-06-07 | Disposition: A | Discharge status: Home / Self Care | Payer: 59 | Source: Ambulatory Visit | Attending: Internal Medicine | Admitting: Internal Medicine

## 2021-06-07 ENCOUNTER — Other Ambulatory Visit: Payer: Self-pay

## 2021-06-07 ENCOUNTER — Encounter (HOSPITAL_COMMUNITY): Payer: Self-pay

## 2021-06-07 ENCOUNTER — Other Ambulatory Visit (HOSPITAL_COMMUNITY): Payer: Self-pay

## 2021-06-07 VITALS — BP 112/77 | HR 87 | Temp 99.2°F | Resp 18

## 2021-06-07 DIAGNOSIS — U071 COVID-19: Secondary | ICD-10-CM | POA: Diagnosis not present

## 2021-06-07 DIAGNOSIS — J4531 Mild persistent asthma with (acute) exacerbation: Secondary | ICD-10-CM | POA: Diagnosis not present

## 2021-06-07 MED ORDER — PREDNISONE 20 MG PO TABS: 40.0000 mg | ORAL_TABLET | Freq: Every day | ORAL | 0 refills | Status: AC

## 2021-06-07 MED ORDER — NIRMATRELVIR/RITONAVIR (PAXLOVID)TABLET: 3.0000 | ORAL_TABLET | Freq: Two times a day (BID) | ORAL | 0 refills | Status: AC

## 2021-06-07 NOTE — ED Triage Notes (Signed)
Pt reports she has been sick and has tested several times for COVID and the first positive COVID test was last night . Pt has been using HHNs and cough meds.

## 2021-06-07 NOTE — Discharge Instructions (Addendum)
This take medications as prescribed Use your inhalers as frequently as needed.  You may use it up to every 4 hours as needed If you have worsening shortness of breath please go to the emergency department to be evaluated further I have started you on Paxlovid.  Please do not take oxycodone because you are taking Paxlovid. You can take Fioricet and Tylenol as needed for pain.

## 2021-06-07 NOTE — Progress Notes (Signed)
°    E-Visit for Corona Virus Screening  Based on what you have shared with me, you need to seek an evaluation for a severe illness. I recommend that you be seen and evaluated face to face. If you are considered high risk for Corona virus because of a known exposure, fever, shortness of breath and cough, OR if you have severe symptoms of any kind, seek medical care at an emergency room or urgent care.   Giving the severity of chest tightness, pressure and shortness of breath, you need to be seen in person.   If you are having a true medical emergency please call 911.   Based on what you shared with me, I feel your condition warrants further evaluation as soon as possible at an Emergency department.    NOTE: There will be NO CHARGE for this eVisit   If you are having a true medical emergency please call 911.      Emergency [facility]  Get Driving Directions  [phone]  [address]  [place], [place] [address]  Open 24/7/365      [facility] Emergency Department at [place]  [address]  [place], [place] [address]  Open 24/7/365    Emergency [facility]  Get Driving Directions  [phone]  [address], [place] [address]  Open 24/7/365      [facility]  Get Driving Directions  [phone]  [address]  [place], [place] [address]  Open 24/7/365    [facility]  Emergency [place]  Get Driving Directions  [phone]  [place], [place] [address]  Open 24/7/365    [facility]  Get Driving Directions  [address]  [place], [place] [address]  Open 24/7/365    [facility]  Emergency [facility]  Get Driving Directions  [phone]  [address]  [place], [place] [address]  Open 24/7/365

## 2021-06-08 NOTE — ED Provider Notes (Signed)
[place]    CSN: [id number] Arrival date & time: 06/07/21  1549      History   Chief Complaint Chief Complaint  Patient presents with   Shortness of Breath   positive COVID last night     HPI [name] is a 51 y.o. female comes to the urgent care for shortness of breath and wheezing.  Patient was exposed to COVID-19 individual several days ago.  She started having symptoms a few days ago and finally tested positive for COVID last night.  She has chest tightness, shortness of breath and some wheezing.  She has been using her inhalers with partial improvement in her symptoms.  No fever or chills.  She has a cough which is not productive of sputum.  No dizziness, near syncope or syncopal episodes.[name]   HPI  Past Medical History:  Diagnosis Date   Allergy    Anemia    Anxiety    claustrophobic   Asthma    Back pain    Biceps tendonosis of right shoulder    Cataract    Mixed OU   COVID-19 2020   covid PNA   Diabetes mellitus 2011   did not start metforfin, losing weight   Dyspnea    Fatty liver    Food allergy    Headache disorder    History of concussion    HLD (hyperlipidemia)    Hypertension    Hypertensive retinopathy    OU   IBS (irritable bowel syndrome)    Infertility, female    Joint pain    Lactose intolerance    Leg edema    Migraines    Palpitation    Post-menopausal    Seborrheic dermatitis    Shoulder impingement syndrome, right    Vitamin D deficiency     Patient Active Problem List   Diagnosis Date Noted   Fatty infiltration of liver 07/26/2020   Diarrhea, functional 06/22/2020   Epigastric pain 06/22/2020   Uveitis, intermediate, bilateral 09/07/2019   Generalized edema 06/13/2019   Abnormal electrocardiogram (ECG) (EKG) 06/07/2019   Hospital discharge follow-up 06/07/2019   Postviral fatigue syndrome 05/30/2019   Other fatigue 01/23/2018   Shortness of breath 01/23/2018   Multinodular goiter 12/05/2017   Multiple  thyroid nodules 12/05/2017   Microalbuminuria due to type 2 diabetes mellitus ([place]) 11/26/2017   Encounter for preventive health examination 11/26/2017   Menopause syndrome 06/10/2017   Adverse reaction to vaccine, sequela 03/25/2017   Palpitations 02/27/2017   PAC (premature atrial contraction) 02/27/2017   PVC's (premature ventricular contractions) 02/27/2017   PSVT (paroxysmal supraventricular tachycardia) ([place]) 11/26/2016   Nontraumatic incomplete tear of right rotator cuff 07/14/2016   AC (acromioclavicular) arthritis 07/14/2016   Impingement syndrome of right shoulder 07/14/2016   Fibrocystic breast changes, right 05/21/2016   Menopausal symptoms 10/30/2015   Insomnia 10/16/2015   Snoring 07/26/2015   Chronic migraine w/o aura w/o status migrainosus, not intractable 02/24/2015   Muscle spasm 02/24/2015   History of concussion 07/21/2014   Hyperlipidemia associated with type 2 diabetes mellitus ([place]) 06/13/2014   Vitamin D deficiency 05/07/2013   Chronic migraine 11/20/2012   S/P Total Abdominal Hysterectomy and Left Salpingo-oophorectomy 10/20/2011   Headache around the eyes    Uncontrolled type 2 diabetes mellitus 07/20/2011   Obesity (BMI 30-39.9) 07/20/2011   Essential hypertension 07/20/2011    Past Surgical History:  Procedure Laterality Date   ABDOMINAL HYSTERECTOMY  2006   heavy menses, endometriosis, l oophrectomy  BACK SURGERY     BREAST BIOPSY Right 2018   benign   BREAST EXCISIONAL BIOPSY Right 2003   benign   BREAST EXCISIONAL BIOPSY Right 1999   benign   BREAST SURGERY     right breast x 2 , benign   COLONOSCOPY  10/06/2020   HERNIA REPAIR  2003   left inguinal    LEFT OOPHORECTOMY     SHOULDER ARTHROSCOPY WITH SUBACROMIAL DECOMPRESSION AND OPEN ROTATOR C Right 07/14/2016   Procedure: RIGHT SHOULDER ARTHROSCOPY WITH SUBACROMIAL DECOMPRESSION, DISTAL CLAVICLE RESECTION AND MINI OPEN ROTATOR CUFF REPAIR, OPEN BICEP TENDODESIS;  Surgeon: Garald Balding, MD;  Location: [facility];  Service: Orthopedics;  Laterality: Right;   SHOULDER CLOSED REDUCTION Right 09/08/2016   Procedure: RIGHT CLOSED MANIPULATION SHOULDER;  Surgeon: Garald Balding, MD;  Location: [facility];  Service: Orthopedics;  Laterality: Right;   TUBAL LIGATION     UPPER GASTROINTESTINAL ENDOSCOPY  10/06/2020    OB History     Gravida  3   Para  2   Term  2   Preterm  0   AB  1   Living  0      SAB  0   IAB  0   Ectopic  1   Multiple  0   Live Births  2            Home Medications    Prior to Admission medications   Medication Sig Start Date End Date Taking? Authorizing Provider  nirmatrelvir/ritonavir EUA (PAXLOVID) 20 x 150 MG & 10 x 100MG  TABS Take 3 tablets by mouth 2 (two) times daily for 5 days. Patient GFR is 62. Take nirmatrelvir (150 mg) two tablets twice daily for 5 days and ritonavir (100 mg) one tablet twice daily for 5 days. 06/07/21 06/12/21 Yes Orby Tangen, Myrene Galas, MD  predniSONE (DELTASONE) 20 MG tablet Take 2 tablets (40 mg total) by mouth daily for 5 days. 06/07/21 06/12/21 Yes Jerrico Covello, Myrene Galas, MD  albuterol (VENTOLIN HFA) 108 (90 Base) MCG/ACT inhaler Inhale 2 puffs into the lungs every 6 (six) hours as needed for wheezing or shortness of breath. 05/23/19   Wurst, [place], PA-C  ALPRAZolam (XANAX) 0.5 MG tablet Take 1 tablet (0.5 mg total) by mouth 3 (three) times daily as needed (Headache). 06/02/19   Enzo Bi, MD  ALPRAZolam Duanne Moron) 0.5 MG tablet Take 1-2 tablets 45 minutes before MRI study; 1 additional tablet can be taken at the time of the study MUST HAVE DRIVER 67/67/20   Genia Harold, MD  aspirin EC 81 MG tablet Take 81 mg by mouth daily.    [provider]  atorvastatin (LIPITOR) 20 MG tablet Take 1 tablet (20 mg total) by mouth daily. 11/26/20   Crecencio Mc, MD  baclofen (LIORESAL) 10 MG tablet baclofen 10 mg tablet    [provider]  Blood Glucose Monitoring  Suppl (FREESTYLE LITE) DEVI 1 each by Does not apply route 2 (two) times daily. E11.9 07/12/19   Renato Shin, MD  Blood Pressure Monitoring (OMRON 3 SERIES BP MONITOR) DEVI Use as directed 01/05/21     butalbital-acetaminophen-caffeine (FIORICET) 50-325-40 MG tablet Take 1 tablet by mouth every 6 (six) hours as needed for headache. Do not refill in less than 30 day 06/05/19   Crecencio Mc, MD  Continuous Blood Gluc Receiver (DEXCOM G6 RECEIVER) DEVI Use as directed. 05/03/21   Renato Shin, MD  Continuous Blood Gluc Sensor [facility]  G6 SENSOR) MISC Use as directed. Change every 10 days. 05/03/21   Renato Shin, MD  Continuous Blood Gluc Transmit (DEXCOM G6 TRANSMITTER) MISC Use as directed every 90 days. 05/03/21   Renato Shin, MD  diazepam (VALIUM) 2 MG tablet Take 1 tablet (2 mg total) by mouth every 6 (six) hours as needed (Headache). 08/07/19   Crecencio Mc, MD  dicyclomine (BENTYL) 20 MG tablet Take 1 tablet (20 mg total) by mouth every 6 (six) hours. 04/26/21   Thornton Park, MD  EPINEPHrine 0.3 mg/0.3 mL IJ SOAJ injection Inject 0.3 mg into the muscle as needed (for allergic reaction). 11/26/20   Crecencio Mc, MD  Fremanezumab-vfrm (AJOVY) 225 MG/1.5ML SOSY Inject 1.5 mLs into the skin every 30 (thirty) days. 12/22/20   Jaclyn Prime, Collene Leyden, PA-C  glucose blood (FREESTYLE LITE) test strip 1 each by Other route 2 (two) times daily. E11.9 07/12/19   Renato Shin, MD  insulin lispro (HUMALOG) 100 UNIT/ML KwikPen Inject 22 Units into the skin 2 (two) times daily with a meal. 01/26/21   Renato Shin, MD  Insulin Pen Needle (UNIFINE PENTIPS) 32G X 4 MM MISC Use as directed two times a day 03/24/21   Renato Shin, MD  Lancets (FREESTYLE) lancets 1 each by Other route 2 (two) times daily. E11.9 07/12/19   Renato Shin, MD  losartan-hydrochlorothiazide [facility]) 50-12.5 MG tablet Take 1 tablet by mouth daily. 04/26/21   Crecencio Mc, MD  metoprolol succinate (TOPROL-XL) 25 MG 24 hr tablet  Take 0.5 tablets (12.5 mg total) by mouth daily. 12/16/20   End, Harrell Gave, MD  ondansetron (ZOFRAN ODT) 4 MG disintegrating tablet Take 1 tablet (4 mg total) by mouth every 8 (eight) hours as needed for nausea or vomiting. 09/13/19   Crecencio Mc, MD  ondansetron (ZOFRAN) 8 MG tablet Take 1 tablet (8 mg total) by mouth every 8 (eight) hours as needed for nausea or vomiting. 03/15/21   Raylene Everts, MD  OXcarbazepine ER 150 MG TB24 TAKE 1 TABLET BY MOUTH DAILY AT BEDTIME 06/15/20 06/15/21  Dalton-Bethea, Fabio Asa, MD  oxyCODONE-acetaminophen (PERCOCET) 7.5-325 MG tablet Take 1 tablet by mouth every 4 (four) hours as needed for severe pain. 03/15/21   Raylene Everts, MD  oxyCODONE-acetaminophen (PERCOCET/ROXICET) 5-325 MG tablet Take by mouth as needed for severe pain.    [provider]  pantoprazole (PROTONIX) 40 MG tablet Take 1 tablet (40 mg total) by mouth daily. Take only with water and take 30 minutes before eating. 10/06/20   Thornton Park, MD  Semaglutide, 2 MG/DOSE, (OZEMPIC, 2 MG/DOSE,) 8 MG/3ML SOPN Inject 2 mg into the skin once a week. 12/25/20   Renato Shin, MD    Family History Family History  Problem Relation Age of Onset   Diabetes Mother    Heart disease Mother 60   Hypertension Mother    Hyperlipidemia Mother    Heart failure Mother    Stroke Mother    Thyroid disease Mother    Depression Mother    Sleep apnea Mother    Obesity Mother    Colon polyps Mother    Cancer Father 47       Lung Cancer   Heart disease Father    Mental illness Sister        bipolar, substance abuse,  clean 2 yrs   Hypertension Sister    Cancer Paternal Grandmother 79       breast cancer   Breast cancer Paternal Grandmother  Diabetes Maternal Grandmother    Hypertension Maternal Grandmother    Diabetes Son    Colon cancer Neg Hx    Esophageal cancer Neg Hx    Rectal cancer Neg Hx    Stomach cancer Neg Hx     Social History Social History   Tobacco Use    Smoking status: Never   Smokeless tobacco: Never  Vaping Use   Vaping Use: Never used  Substance Use Topics   Alcohol use: No   Drug use: No     Allergies   Bamlanivimab, Botox [onabotulinumtoxina], Clindamycin/lincomycin, Kiwi extract, Maxalt [rizatriptan benzoate], Nitrous oxide, Rizatriptan benzoate, Triptans, Aspirin, Contrast media [iodinated contrast media], Erythromycin, Flagyl [metronidazole], Haemophilus influenzae vaccines, Latex, Mango flavor, Vicodin [hydrocodone-acetaminophen], Influenza virus vaccine, Penicillins, and Tetracyclines & related   Review of Systems Review of Systems  HENT: Negative.  Negative for congestion, rhinorrhea, sneezing, sore throat and voice change.   Respiratory:  Positive for cough, shortness of breath and wheezing.   Neurological: Negative.     Physical Exam Triage Vital Signs ED Triage Vitals  Enc Vitals Group     BP 06/07/21 1622 112/77     Pulse Rate 06/07/21 1622 87     Resp 06/07/21 1622 18     Temp 06/07/21 1622 99.2 F (37.3 C)     Temp src --      SpO2 06/07/21 1622 95 %     Weight --      Height --      Head Circumference --      Peak Flow --      Pain Score 06/07/21 1618 5     Pain Loc --      Pain Edu? --      Excl. in [place]? --    No data found.  Updated Vital Signs BP 112/77    Pulse 87    Temp 99.2 F (37.3 C)    Resp 18    SpO2 95%   Visual Acuity Right Eye Distance:   Left Eye Distance:   Bilateral Distance:    Right Eye Near:   Left Eye Near:    Bilateral Near:     Physical Exam Vitals and nursing note reviewed.  Constitutional:      General: She is not in acute distress.    Appearance: She is not ill-appearing.  Cardiovascular:     Rate and Rhythm: Normal rate and regular rhythm.  Pulmonary:     Breath sounds: Examination of the right-lower field reveals wheezing. Examination of the left-lower field reveals wheezing. Wheezing present. No decreased breath sounds, rhonchi or rales.  Abdominal:      General: Bowel sounds are normal.     Palpations: Abdomen is soft.  Neurological:     Mental Status: She is alert.     UC Treatments / Results  Labs (all labs ordered are listed, but only abnormal results are displayed) Labs Reviewed - No data to display  EKG   Radiology No results found.  Procedures Procedures (including critical care time)  Medications Ordered in UC Medications - No data to display  Initial Impression / Assessment and Plan / UC Course  I have reviewed the triage vital signs and the nursing notes.  Pertinent labs & imaging results that were available during my care of the patient were reviewed by me and considered in my medical decision making (see chart for details).     1.  Mild persistent asthma with acute exacerbation: Prednisone 40  mg orally daily for 5 days Albuterol inhaler as needed for chest tightness and wheezing Maintain adequate hydration If you have worsening symptoms please return to urgent care to be reevaluated No indication for antibiotics  2.  COVID-19 infection: Paxlovid tablets BID for 5 days Tylenol/Motrin as needed for pain and/or fever Return to urgent care if symptoms worsen. Final Clinical Impressions(s) / UC Diagnoses   Final diagnoses:  Mild persistent asthma with (acute) exacerbation  COVID-19 virus infection     Discharge Instructions      This take medications as prescribed Use your inhalers as frequently as needed.  You may use it up to every 4 hours as needed If you have worsening shortness of breath please go to the emergency department to be evaluated further I have started you on Paxlovid.  Please do not take oxycodone because you are taking Paxlovid. You can take Fioricet and Tylenol as needed for pain.     ED Prescriptions     Medication Sig Dispense Auth. Provider   predniSONE (DELTASONE) 20 MG tablet Take 2 tablets (40 mg total) by mouth daily for 5 days. 10 tablet Jaivion Kingsley, Myrene Galas, MD    nirmatrelvir/ritonavir EUA (PAXLOVID) 20 x 150 MG & 10 x 100MG  TABS Take 3 tablets by mouth 2 (two) times daily for 5 days. Patient GFR is 62. Take nirmatrelvir (150 mg) two tablets twice daily for 5 days and ritonavir (100 mg) one tablet twice daily for 5 days. 30 tablet Makale Pindell, Myrene Galas, MD      PDMP not reviewed this encounter.   Chase Picket, MD 06/08/21 [phone]

## 2021-06-15 ENCOUNTER — Other Ambulatory Visit (HOSPITAL_COMMUNITY): Payer: Self-pay

## 2021-06-15 ENCOUNTER — Telehealth: Payer: 59 | Admitting: Nurse Practitioner

## 2021-06-15 VITALS — HR 95

## 2021-06-15 DIAGNOSIS — U071 COVID-19: Secondary | ICD-10-CM | POA: Diagnosis not present

## 2021-06-15 MED ORDER — LEVOFLOXACIN 500 MG PO TABS
500.0000 mg | ORAL_TABLET | Freq: Every day | ORAL | 0 refills | Status: AC
Start: 2021-06-15 — End: 2021-06-22

## 2021-06-15 NOTE — Progress Notes (Signed)
Virtual Visit Consent   [name], you are scheduled for a virtual visit with a [facility] provider today.     Just as with appointments in the office, your consent must be obtained to participate.  Your consent will be active for this visit and any virtual visit you may have with one of our providers in the next 365 days.     If you have a MyChart account, a copy of this consent can be sent to you electronically.  All virtual visits are billed to your insurance company just like a traditional visit in the office.    As this is a virtual visit, video technology does not allow for your provider to perform a traditional examination.  This may limit your provider's ability to fully assess your condition.  If your provider identifies any concerns that need to be evaluated in person or the need to arrange testing (such as labs, EKG, etc.), we will make arrangements to do so.     Although advances in technology are sophisticated, we cannot ensure that it will always work on either your end or our end.  If the connection with a video visit is poor, the visit may have to be switched to a telephone visit.  With either a video or telephone visit, we are not always able to ensure that we have a secure connection.     I need to obtain your verbal consent now.   Are you willing to proceed with your visit today?    TOTIANA EVERSON has provided verbal consent on 06/15/2021 for a virtual visit (video or telephone).   Apolonio Schneiders, FNP   Date: 06/15/2021 10:19 AM   Virtual Visit via Video Note   I, Apolonio Schneiders, connected with  [name]  ([id number], [birth year]) on 06/15/21 at 10:45 AM EST by a video-enabled telemedicine application and verified that I am speaking with the correct person using two identifiers.  Location: Patient: Virtual Visit Location Patient: Home Provider: Virtual Visit Location Provider: Home Office   I discussed the limitations of evaluation and management by  telemedicine and the availability of in person appointments. The patient expressed understanding and agreed to proceed.    History of Present Illness: [name] is a 51 y.o. who identifies as a female who was assigned female at birth, and is being seen today for ongoing COVID symptoms. She tested positive 06/06/21, symptom onset was a few days prior.  She went to UC on 06/08/21 for assessment due to COVID diagnosis and history of asthma. She was treated with prednisone at that time, started on both her Albuterol and her steroidal inhaler. She did not start the Rafael Capo - pharmacy instructed her not to due to interaction with prednisone.   She feels today like her asthma and coughing are persisting. She is also complaining of light sensitivity for the past week that she also suffered with previous COVID infection.  Notes she had uvulitis with her last round of COVID as well.    Also had COVID 05/2019 was hospitalized at that time as well.   Stopped prednisone on 06/11/21  She has a pulse Ox at home has been monitoring her oxygen at home  She does become short of breath with activity.  She is an Therapist, sports   She has not been vaccinated for COVID due to medical exemption-   She has an extended list of allergies, has tolerated Levaquin in the past.  Related allergy listed was  Rizatriptan Benzoate for which patient said she took in 2013 and caused chest pain. Has tolerated Levaquin since that time without SE  Problems:  Patient Active Problem List   Diagnosis Date Noted   Fatty infiltration of liver 07/26/2020   Diarrhea, functional 06/22/2020   Epigastric pain 06/22/2020   Uveitis, intermediate, bilateral 09/07/2019   Generalized edema 06/13/2019   Abnormal electrocardiogram (ECG) (EKG) 06/07/2019   Hospital discharge follow-up 06/07/2019   Postviral fatigue syndrome 05/30/2019   Other fatigue 01/23/2018   Shortness of breath 01/23/2018   Multinodular goiter 12/05/2017   Multiple thyroid  nodules 12/05/2017   Microalbuminuria due to type 2 diabetes mellitus ([place]) 11/26/2017   Encounter for preventive health examination 11/26/2017   Menopause syndrome 06/10/2017   Adverse reaction to vaccine, sequela 03/25/2017   Palpitations 02/27/2017   PAC (premature atrial contraction) 02/27/2017   PVC's (premature ventricular contractions) 02/27/2017   PSVT (paroxysmal supraventricular tachycardia) ([place]) 11/26/2016   Nontraumatic incomplete tear of right rotator cuff 07/14/2016   AC (acromioclavicular) arthritis 07/14/2016   Impingement syndrome of right shoulder 07/14/2016   Fibrocystic breast changes, right 05/21/2016   Menopausal symptoms 10/30/2015   Insomnia 10/16/2015   Snoring 07/26/2015   Chronic migraine w/o aura w/o status migrainosus, not intractable 02/24/2015   Muscle spasm 02/24/2015   History of concussion 07/21/2014   Hyperlipidemia associated with type 2 diabetes mellitus ([place]) 06/13/2014   Vitamin D deficiency 05/07/2013   Chronic migraine 11/20/2012   S/P Total Abdominal Hysterectomy and Left Salpingo-oophorectomy 10/20/2011   Headache around the eyes    Uncontrolled type 2 diabetes mellitus 07/20/2011   Obesity (BMI 30-39.9) 07/20/2011   Essential hypertension 07/20/2011    Allergies:  Allergies  Allergen Reactions   Bamlanivimab Anaphylaxis, Itching, Palpitations, Rash, Shortness Of Breath and Swelling   Botox [Onabotulinumtoxina] Shortness Of Breath    syncope   Clindamycin/Lincomycin Other (See Comments)   Kiwi Extract Shortness Of Breath   Maxalt [Rizatriptan Benzoate] Anaphylaxis    Chest pain   Nitrous Oxide Shortness Of Breath    syncope   Rizatriptan Benzoate Anaphylaxis    Chest pain   Triptans Shortness Of Breath   Aspirin Nausea And Vomiting    Mouth blisters   Contrast Media [Iodinated Contrast Media]    Erythromycin    Flagyl [Metronidazole]    Haemophilus Influenzae Vaccines    Latex     Sometimes causes rash   Mango Flavor     Vicodin [Hydrocodone-Acetaminophen]     hallucinations   Influenza Virus Vaccine Palpitations   Penicillins Nausea And Vomiting, Rash and Other (See Comments)    Did it involve swelling of the face/tongue/throat, SOB, or low BP? No Did it involve sudden or severe rash/hives, skin peeling, or any reaction on the inside of your mouth or nose? No Did you need to seek medical attention at a hospital or doctor's office? Yes When did it last happen?     patient was 51 years old  If all above answers are NO, may proceed with cephalosporin use.   Tetracyclines & Related Nausea And Vomiting and Rash   Medications:  Current Outpatient Medications:    albuterol (VENTOLIN HFA) 108 (90 Base) MCG/ACT inhaler, Inhale 2 puffs into the lungs every 6 (six) hours as needed for wheezing or shortness of breath., Disp: 18 g, Rfl: 0   ALPRAZolam (XANAX) 0.5 MG tablet, Take 1 tablet (0.5 mg total) by mouth 3 (three) times daily as needed (Headache)., Disp: , Rfl:  ALPRAZolam (XANAX) 0.5 MG tablet, Take 1-2 tablets 45 minutes before MRI study; 1 additional tablet can be taken at the time of the study MUST HAVE DRIVER, Disp: 3 tablet, Rfl: 0   aspirin EC 81 MG tablet, Take 81 mg by mouth daily., Disp: , Rfl:    atorvastatin (LIPITOR) 20 MG tablet, Take 1 tablet (20 mg total) by mouth daily., Disp: 90 tablet, Rfl: 3   baclofen (LIORESAL) 10 MG tablet, baclofen 10 mg tablet, Disp: , Rfl:    Blood Glucose Monitoring Suppl (FREESTYLE LITE) DEVI, 1 each by Does not apply route 2 (two) times daily. E11.9, Disp: 1 each, Rfl: 0   Blood Pressure Monitoring (OMRON 3 SERIES BP MONITOR) DEVI, Use as directed, Disp: 1 each, Rfl: 0   butalbital-acetaminophen-caffeine (FIORICET) 50-325-40 MG tablet, Take 1 tablet by mouth every 6 (six) hours as needed for headache. Do not refill in less than 30 day, Disp: 30 tablet, Rfl: 1   Continuous Blood Gluc Receiver (DEXCOM G6 RECEIVER) DEVI, Use as directed., Disp: 1 each, Rfl: 1    Continuous Blood Gluc Sensor (DEXCOM G6 SENSOR) MISC, Use as directed. Change every 10 days., Disp: 9 each, Rfl: 3   Continuous Blood Gluc Transmit (DEXCOM G6 TRANSMITTER) MISC, Use as directed every 90 days., Disp: 1 each, Rfl: 1   diazepam (VALIUM) 2 MG tablet, Take 1 tablet (2 mg total) by mouth every 6 (six) hours as needed (Headache)., Disp: 30 tablet, Rfl: 5   dicyclomine (BENTYL) 20 MG tablet, Take 1 tablet (20 mg total) by mouth every 6 (six) hours., Disp: 90 tablet, Rfl: 1   EPINEPHrine 0.3 mg/0.3 mL IJ SOAJ injection, Inject 0.3 mg into the muscle as needed (for allergic reaction)., Disp: 2 each, Rfl: 3   Fremanezumab-vfrm (AJOVY) 225 MG/1.5ML SOSY, Inject 1.5 mLs into the skin every 30 (thirty) days., Disp: 1.5 mL, Rfl: 6   glucose blood (FREESTYLE LITE) test strip, 1 each by Other route 2 (two) times daily. E11.9, Disp: 200 each, Rfl: 0   insulin lispro (HUMALOG) 100 UNIT/ML KwikPen, Inject 22 Units into the skin 2 (two) times daily with a meal., Disp: 45 mL, Rfl: 3   Insulin Pen Needle (UNIFINE PENTIPS) 32G X 4 MM MISC, Use as directed two times a day, Disp: 100 each, Rfl: 4   Lancets (FREESTYLE) lancets, 1 each by Other route 2 (two) times daily. E11.9, Disp: 200 each, Rfl: 0   losartan-hydrochlorothiazide (HYZAAR) 50-12.5 MG tablet, Take 1 tablet by mouth daily., Disp: 90 tablet, Rfl: 1   metoprolol succinate (TOPROL-XL) 25 MG 24 hr tablet, Take 0.5 tablets (12.5 mg total) by mouth daily., Disp: 45 tablet, Rfl: 2   ondansetron (ZOFRAN ODT) 4 MG disintegrating tablet, Take 1 tablet (4 mg total) by mouth every 8 (eight) hours as needed for nausea or vomiting., Disp: 20 tablet, Rfl: 0   ondansetron (ZOFRAN) 8 MG tablet, Take 1 tablet (8 mg total) by mouth every 8 (eight) hours as needed for nausea or vomiting., Disp: 20 tablet, Rfl: 0   OXcarbazepine ER 150 MG TB24, TAKE 1 TABLET BY MOUTH DAILY AT BEDTIME, Disp: 30 tablet, Rfl: 5   oxyCODONE-acetaminophen (PERCOCET) 7.5-325 MG tablet, Take  1 tablet by mouth every 4 (four) hours as needed for severe pain., Disp: 15 tablet, Rfl: 0   oxyCODONE-acetaminophen (PERCOCET/ROXICET) 5-325 MG tablet, Take by mouth as needed for severe pain., Disp: , Rfl:    pantoprazole (PROTONIX) 40 MG tablet, Take 1 tablet (40 mg total)  by mouth daily. Take only with water and take 30 minutes before eating., Disp: 90 tablet, Rfl: 3   Semaglutide, 2 MG/DOSE, (OZEMPIC, 2 MG/DOSE,) 8 MG/3ML SOPN, Inject 2 mg into the skin once a week., Disp: 9 mL, Rfl: 3  Observations/Objective: Patient is well-developed, well-nourished in no acute distress.  Resting comfortably  at home.  Head is normocephalic, atraumatic.  Sob with walking, non labored at rest  Speech is clear and coherent with logical content.  Patient is alert and oriented at baseline.   Today's Vitals   06/15/21 1026  Pulse: 95  SpO2: 98%   There is no height or weight on file to calculate BMI.   Assessment and Plan: 1. COVID-19  Likely secondary infection :  - levofloxacin (LEVAQUIN) 500 MG tablet; Take 1 tablet (500 mg total) by mouth daily for 7 days.  Dispense: 7 tablet; Refill: 0    Advised patient to continue to monitor SPO2 at home Continue inhaler regimen as ordered  If symptoms worsen she will need an urgent or ER in person evaluation as discussed Encouraged to send Mychart to PCP for scheduled follow up later this week.   Follow Up Instructions: I discussed the assessment and treatment plan with the patient. The patient was provided an opportunity to ask questions and all were answered. The patient agreed with the plan and demonstrated an understanding of the instructions.  A copy of instructions were sent to the patient via MyChart unless otherwise noted below.    The patient was advised to call back or seek an in-person evaluation if the symptoms worsen or if the condition fails to improve as anticipated.  Time:  I spent 15 minutes with the patient via telehealth technology  discussing the above problems/concerns.    Apolonio Schneiders, FNP

## 2021-06-16 ENCOUNTER — Other Ambulatory Visit (HOSPITAL_COMMUNITY): Payer: Self-pay | Admitting: *Deleted

## 2021-06-16 ENCOUNTER — Encounter: Payer: Self-pay | Admitting: Internal Medicine

## 2021-06-17 ENCOUNTER — Telehealth: Payer: Self-pay

## 2021-06-17 ENCOUNTER — Encounter (HOSPITAL_COMMUNITY): Payer: Self-pay

## 2021-06-17 ENCOUNTER — Ambulatory Visit (HOSPITAL_COMMUNITY)
Admission: RE | Admit: 2021-06-17 | Discharge: 2021-06-17 | Disposition: A | Discharge status: Home / Self Care | Payer: 59 | Source: Ambulatory Visit | Attending: Psychiatry | Admitting: Psychiatry

## 2021-06-17 ENCOUNTER — Other Ambulatory Visit: Payer: Self-pay

## 2021-06-17 ENCOUNTER — Emergency Department (HOSPITAL_COMMUNITY)
Admission: EM | Admit: 2021-06-17 | Discharge: 2021-06-17 | Disposition: A | Discharge status: Home / Self Care | Payer: 59 | Attending: Emergency Medicine | Admitting: Emergency Medicine

## 2021-06-17 DIAGNOSIS — Z91040 Latex allergy status: Secondary | ICD-10-CM | POA: Diagnosis not present

## 2021-06-17 DIAGNOSIS — R079 Chest pain, unspecified: Secondary | ICD-10-CM | POA: Insufficient documentation

## 2021-06-17 DIAGNOSIS — R0602 Shortness of breath: Secondary | ICD-10-CM | POA: Insufficient documentation

## 2021-06-17 DIAGNOSIS — Z7982 Long term (current) use of aspirin: Secondary | ICD-10-CM | POA: Diagnosis not present

## 2021-06-17 DIAGNOSIS — I498 Other specified cardiac arrhythmias: Secondary | ICD-10-CM | POA: Insufficient documentation

## 2021-06-17 DIAGNOSIS — R0789 Other chest pain: Secondary | ICD-10-CM | POA: Diagnosis not present

## 2021-06-17 DIAGNOSIS — T7840XA Allergy, unspecified, initial encounter: Secondary | ICD-10-CM | POA: Insufficient documentation

## 2021-06-17 MED ORDER — METHYLPREDNISOLONE SODIUM SUCC 125 MG IJ SOLR
125.0000 mg | Freq: Once | INTRAMUSCULAR | Status: AC
  Administered 2021-06-17: 125 mg via INTRAVENOUS
  Filled 2021-06-17: qty 2

## 2021-06-17 MED ORDER — NITROGLYCERIN 0.4 MG SL SUBL: 0.4000 mg | SUBLINGUAL_TABLET | SUBLINGUAL | Status: DC | PRN

## 2021-06-17 MED ORDER — SODIUM CHLORIDE 0.9 % IV SOLN
100.0000 mg | Freq: Once | INTRAVENOUS | Status: AC
  Administered 2021-06-17: 100 mg via INTRAVENOUS
  Filled 2021-06-17: qty 1

## 2021-06-17 MED ORDER — EPINEPHRINE 0.3 MG/0.3ML IJ SOAJ: 0.3000 mg | INTRAMUSCULAR | 0 refills | Status: DC | PRN

## 2021-06-17 MED ORDER — DIPHENHYDRAMINE HCL 50 MG/ML IJ SOLN
50.0000 mg | Freq: Once | INTRAMUSCULAR | Status: AC
  Administered 2021-06-17: 50 mg via INTRAVENOUS
  Filled 2021-06-17: qty 1

## 2021-06-17 MED ORDER — SODIUM CHLORIDE 0.9 % IV SOLN: INTRAVENOUS | Status: DC

## 2021-06-17 MED ORDER — DIPHENHYDRAMINE HCL 25 MG PO CAPS: 25.0000 mg | ORAL_CAPSULE | Freq: Once | ORAL | Status: DC

## 2021-06-17 MED ORDER — DIPHENHYDRAMINE HCL 25 MG PO CAPS
ORAL_CAPSULE | ORAL | Status: AC
  Administered 2021-06-17: 25 mg
  Filled 2021-06-17: qty 1

## 2021-06-17 NOTE — ED Provider Triage Note (Signed)
Emergency Medicine Provider Triage Evaluation Note  [name] , a 51 y.o. female  was evaluated in triage.  Pt complains of drug reaction.  Review of Systems  Positive: Cp, heart palpitation, chest tightness Negative: N/v, rash  Physical Exam  There were no vitals taken for this visit. Gen:   Awake, no distress   Resp:  Normal effort  MSK:   Moves extremities without difficulty  Other:    Medical Decision Making  Medically screening exam initiated at 11:47 AM.  Appropriate orders placed.  [name] was informed that the remainder of the evaluation will be completed by another provider, this initial triage assessment does not replace that evaluation, and the importance of remaining in the ED until their evaluation is complete.  Pt received a migraine infusion PTA.  15 minutes into the infusion she developed cp, chest pressure and sob.  She has extensive hx of drug allergy.  Had covid recently and was given Levaquin yesterday as well.  No significant cardiac hx.     Stephanie Moras, PA-C 06/17/21 1149

## 2021-06-17 NOTE — Progress Notes (Signed)
Client transferred to ER triage via stretcher with O2 and cardiac monitor showing NSR; report given to PA and RN in triage

## 2021-06-17 NOTE — Progress Notes (Signed)
Pt started Vyepti at [id number] and at 0950 pt called out stating that it felt like her chest was tight and her pulse was racing. VS remained normal and similar to pre medication (BP 122/93 and pulse 84). She then became nauseous. Infusion stopped and MD notified. Benedryl 25 given as ordered and we will continue to assess for 30 minutes. Pt at this time states that she is feeling better. No emesis ever but just retching.

## 2021-06-17 NOTE — ED Provider Notes (Signed)
[facility] EMERGENCY DEPARTMENT Provider Note   CSN: [id number] Arrival date & time: 06/17/21  1133     History  Chief Complaint  Patient presents with   Chest Pain    [name] is a 51 y.o. female. Presents to the emergency department today after developing chest pain, chest pressure and shortness of breath while receiving a migraine infusion earlier today.  She has a history of anaphylaxis and extensive drug allergy.  Chest Pain     Home Medications Prior to Admission medications   Medication Sig Start Date End Date Taking? Authorizing Provider  EPINEPHrine 0.3 mg/0.3 mL IJ SOAJ injection Inject 0.3 mg into the muscle as needed for anaphylaxis. 06/17/21  Yes Shayna Eblen, Adora Fridge, PA-C  albuterol (VENTOLIN HFA) 108 (90 Base) MCG/ACT inhaler Inhale 2 puffs into the lungs every 6 (six) hours as needed for wheezing or shortness of breath. 05/23/19   Wurst, [place], PA-C  ALPRAZolam (XANAX) 0.5 MG tablet Take 1 tablet (0.5 mg total) by mouth 3 (three) times daily as needed (Headache). 06/02/19   Enzo Bi, MD  ALPRAZolam Duanne Moron) 0.5 MG tablet Take 1-2 tablets 45 minutes before MRI study; 1 additional tablet can be taken at the time of the study MUST HAVE DRIVER 02/54/27   Genia Harold, MD  aspirin EC 81 MG tablet Take 81 mg by mouth daily.    [provider]  atorvastatin (LIPITOR) 20 MG tablet Take 1 tablet (20 mg total) by mouth daily. 11/26/20   Crecencio Mc, MD  baclofen (LIORESAL) 10 MG tablet baclofen 10 mg tablet    [provider]  Blood Glucose Monitoring Suppl (FREESTYLE LITE) DEVI 1 each by Does not apply route 2 (two) times daily. E11.9 07/12/19   Renato Shin, MD  Blood Pressure Monitoring (OMRON 3 SERIES BP MONITOR) DEVI Use as directed 01/05/21     butalbital-acetaminophen-caffeine (FIORICET) 50-325-40 MG tablet Take 1 tablet by mouth every 6 (six) hours as needed for headache. Do not refill in less than 30 day 06/05/19   Crecencio Mc, MD  Continuous Blood Gluc Receiver (DEXCOM G6 RECEIVER) DEVI Use as directed. 05/03/21   Renato Shin, MD  Continuous Blood Gluc Sensor (DEXCOM G6 SENSOR) MISC Use as directed. Change every 10 days. 05/03/21   Renato Shin, MD  Continuous Blood Gluc Transmit (DEXCOM G6 TRANSMITTER) MISC Use as directed every 90 days. 05/03/21   Renato Shin, MD  diazepam (VALIUM) 2 MG tablet Take 1 tablet (2 mg total) by mouth every 6 (six) hours as needed (Headache). 08/07/19   Crecencio Mc, MD  dicyclomine (BENTYL) 20 MG tablet Take 1 tablet (20 mg total) by mouth every 6 (six) hours. 04/26/21   Thornton Park, MD  Fremanezumab-vfrm (AJOVY) 225 MG/1.5ML SOSY Inject 1.5 mLs into the skin every 30 (thirty) days. 12/22/20   Jaclyn Prime, Collene Leyden, PA-C  glucose blood (FREESTYLE LITE) test strip 1 each by Other route 2 (two) times daily. E11.9 07/12/19   Renato Shin, MD  insulin lispro (HUMALOG) 100 UNIT/ML KwikPen Inject 22 Units into the skin 2 (two) times daily with a meal. 01/26/21   Renato Shin, MD  Insulin Pen Needle (UNIFINE PENTIPS) 32G X 4 MM MISC Use as directed two times a day 03/24/21   Renato Shin, MD  Lancets (FREESTYLE) lancets 1 each by Other route 2 (two) times daily. E11.9 07/12/19   Renato Shin, MD  levofloxacin (LEVAQUIN) 500 MG tablet Take 1 tablet (500 mg total)  by mouth daily for 7 days. 06/15/21 06/22/21  Apolonio Schneiders, FNP  losartan-hydrochlorothiazide (HYZAAR) 50-12.5 MG tablet Take 1 tablet by mouth daily. 04/26/21   Crecencio Mc, MD  metoprolol succinate (TOPROL-XL) 25 MG 24 hr tablet Take 0.5 tablets (12.5 mg total) by mouth daily. 12/16/20   End, Harrell Gave, MD  ondansetron (ZOFRAN ODT) 4 MG disintegrating tablet Take 1 tablet (4 mg total) by mouth every 8 (eight) hours as needed for nausea or vomiting. 09/13/19   Crecencio Mc, MD  ondansetron (ZOFRAN) 8 MG tablet Take 1 tablet (8 mg total) by mouth every 8 (eight) hours as needed for nausea or vomiting. 03/15/21    Raylene Everts, MD  OXcarbazepine ER 150 MG TB24 TAKE 1 TABLET BY MOUTH DAILY AT BEDTIME 06/15/20 06/15/21  Dalton-Bethea, Fabio Asa, MD  oxyCODONE-acetaminophen (PERCOCET) 7.5-325 MG tablet Take 1 tablet by mouth every 4 (four) hours as needed for severe pain. 03/15/21   Raylene Everts, MD  oxyCODONE-acetaminophen (PERCOCET/ROXICET) 5-325 MG tablet Take by mouth as needed for severe pain.    [provider]  pantoprazole (PROTONIX) 40 MG tablet Take 1 tablet (40 mg total) by mouth daily. Take only with water and take 30 minutes before eating. 10/06/20   Thornton Park, MD  Semaglutide, 2 MG/DOSE, (OZEMPIC, 2 MG/DOSE,) 8 MG/3ML SOPN Inject 2 mg into the skin once a week. 12/25/20   Renato Shin, MD      Allergies    Bamlanivimab, Botox [onabotulinumtoxina], Clindamycin/lincomycin, Kiwi extract, Maxalt [rizatriptan benzoate], Nitrous oxide, Rizatriptan benzoate, Triptans, Aspirin, Contrast media [iodinated contrast media], Erythromycin, Flagyl [metronidazole], Haemophilus influenzae vaccines, Latex, Mango flavor, Vicodin [hydrocodone-acetaminophen], Influenza virus vaccine, Penicillins, and Tetracyclines & related    Review of Systems   Review of Systems  Cardiovascular:  Positive for chest pain.   Physical Exam Updated Vital Signs BP (!) 136/91 (BP Location: Left Arm)    Pulse 89    Temp 99 F (37.2 C) (Oral)    Resp 16    Ht 5\' 6"  (1.676 m)    Wt 95.3 kg    SpO2 96%    BMI 33.89 kg/m  Physical Exam Vitals and nursing note reviewed.  Constitutional:      General: She is not in acute distress.    Appearance: Normal appearance. She is well-developed. She is not ill-appearing, toxic-appearing or diaphoretic.  HENT:     Head: Normocephalic and atraumatic.     Nose: No nasal deformity.     Mouth/Throat:     Lips: Pink. No lesions.  Eyes:     General: Gaze aligned appropriately. No scleral icterus.       Right eye: No discharge.        Left eye: No discharge.      Conjunctiva/sclera: Conjunctivae normal.     Right eye: Right conjunctiva is not injected. No exudate or hemorrhage.    Left eye: Left conjunctiva is not injected. No exudate or hemorrhage. Cardiovascular:     Heart sounds: Normal heart sounds. No murmur heard.   No friction rub. No gallop.  Pulmonary:     Effort: Pulmonary effort is normal. No tachypnea, accessory muscle usage or respiratory distress.     Breath sounds: No stridor. No decreased breath sounds, wheezing, rhonchi or rales.  Skin:    General: Skin is warm and dry.  Neurological:     Mental Status: She is alert and oriented to person, place, and time.  Psychiatric:  Mood and Affect: Mood normal.        Speech: Speech normal.        Behavior: Behavior normal. Behavior is cooperative.    ED Results / Procedures / Treatments   Labs (all labs ordered are listed, but only abnormal results are displayed) Labs Reviewed - No data to display  EKG None  Radiology No results found.  Procedures Procedures    Medications Ordered in ED Medications  diphenhydrAMINE (BENADRYL) injection 50 mg (50 mg Intravenous Given 06/17/21 1418)  methylPREDNISolone sodium succinate (SOLU-MEDROL) 125 mg/2 mL injection 125 mg (125 mg Intravenous Given 06/17/21 1418)    ED Course/ Medical Decision Making/ A&P                           Medical Decision Making  Initial triage, patient was given Benadryl and Solu-Medrol.  By the time I saw patient, she was completely asymptomatic.  She had been observed for several hours following this with no recurrence of symptoms.  Patient is ready to go home.  She has EpiPen at home if needed.  Final Clinical Impression(s) / ED Diagnoses Final diagnoses:  Allergic reaction, initial encounter    Rx / DC Orders ED Discharge Orders          Ordered    EPINEPHrine 0.3 mg/0.3 mL IJ SOAJ injection  As needed        06/17/21 1732              Sheila Oats 06/17/21 2009     Fredia Sorrow, MD 06/24/21 [phone]

## 2021-06-17 NOTE — Telephone Encounter (Signed)
Received a call from infusion Threasa Beards) with [facility].  Pt is scheduled to receive vyepti infusion today wanted to confirm with Dr. Billey Gosling if ok to do so since pt start oral course of Levaquin last night.  Confirmed verbally with Dr. Billey Gosling and ok to proceed with infusion.  Melanie verbalized understanding/appreciation for the info.

## 2021-06-17 NOTE — Discharge Instructions (Signed)
I have prescribed you a refill of your epi pen that you can fill at the pharmacy. Please return if your symptoms return.

## 2021-06-17 NOTE — ED Triage Notes (Signed)
Pt arrived from the infusion clinic c/o CP that started 15 mins into a migraine infusion. Pt states it felt like her heart was racing and squeezing. Pt denies any SHOB.

## 2021-06-17 NOTE — Progress Notes (Signed)
Spoke with [facility] pharmacist about client being started on levaquin last night and he states ok to give client v-yepti; then called Dr Georgina Peer office and Jinny Blossom her nurse notified  her of above and Jinny Blossom spoke with Dr Billey Gosling and per Jinny Blossom Dr Billey Gosling said it was ok to give the V-yepti today

## 2021-06-17 NOTE — Progress Notes (Signed)
Client state cont chest pressure and nausea states pressure was 7/10 severity and had decreased and is now back up to 8/10; Megan at Dr Georgina Peer office notified and she will notify Dr Billey Gosling

## 2021-06-17 NOTE — Telephone Encounter (Signed)
Melanie called back from infusion and sts pt is experiencing chest pain 8/10, BP normal. Infusion of Viepty has been stopped Per standing order 25 mg PO was given but chest pain remains the same.   Chest pain protocol will be initiated from the infusion clinic and pt will likely be sent to the ER. Melanie wanted to update Dr. Billey Gosling on this.

## 2021-06-18 ENCOUNTER — Other Ambulatory Visit (HOSPITAL_COMMUNITY): Payer: Self-pay

## 2021-06-22 ENCOUNTER — Other Ambulatory Visit: Payer: Self-pay

## 2021-06-22 ENCOUNTER — Ambulatory Visit
Admission: RE | Admit: 2021-06-22 | Discharge: 2021-06-22 | Disposition: A | Discharge status: Home / Self Care | Payer: 59 | Source: Ambulatory Visit | Attending: Internal Medicine | Admitting: Internal Medicine

## 2021-06-22 DIAGNOSIS — Z1231 Encounter for screening mammogram for malignant neoplasm of breast: Secondary | ICD-10-CM

## 2021-06-23 ENCOUNTER — Other Ambulatory Visit (HOSPITAL_COMMUNITY): Payer: Self-pay

## 2021-06-23 ENCOUNTER — Encounter: Payer: Self-pay | Admitting: Internal Medicine

## 2021-06-23 ENCOUNTER — Other Ambulatory Visit: Payer: Self-pay

## 2021-06-23 ENCOUNTER — Encounter: Payer: Self-pay | Admitting: Endocrinology

## 2021-06-23 ENCOUNTER — Ambulatory Visit: Payer: 59 | Admitting: Internal Medicine

## 2021-06-23 ENCOUNTER — Ambulatory Visit (INDEPENDENT_AMBULATORY_CARE_PROVIDER_SITE_OTHER): Payer: 59

## 2021-06-23 VITALS — BP 126/80 | HR 113 | Temp 97.3°F | Ht 66.0 in | Wt 211.6 lb

## 2021-06-23 DIAGNOSIS — R058 Other specified cough: Secondary | ICD-10-CM | POA: Diagnosis not present

## 2021-06-23 DIAGNOSIS — R000 Tachycardia, unspecified: Secondary | ICD-10-CM | POA: Insufficient documentation

## 2021-06-23 DIAGNOSIS — R0789 Other chest pain: Secondary | ICD-10-CM

## 2021-06-23 DIAGNOSIS — U071 COVID-19: Secondary | ICD-10-CM | POA: Diagnosis not present

## 2021-06-23 DIAGNOSIS — J45901 Unspecified asthma with (acute) exacerbation: Secondary | ICD-10-CM | POA: Diagnosis not present

## 2021-06-23 DIAGNOSIS — R0602 Shortness of breath: Secondary | ICD-10-CM

## 2021-06-23 LAB — D-DIMER, QUANTITATIVE: D-Dimer, Quant: 0.38 mcg/mL FEU (ref <0.50)

## 2021-06-23 MED ORDER — IPRATROPIUM-ALBUTEROL 20-100 MCG/ACT IN AERS
1.0000 | INHALATION_SPRAY | Freq: Four times a day (QID) | RESPIRATORY_TRACT | 11 refills | Status: DC
Start: 2021-06-23 — End: 2024-02-27
  Filled 2021-06-23: qty 4, 30d supply, fill #0

## 2021-06-23 NOTE — Patient Instructions (Addendum)
Miralax 1-2 caps in 8-16 ounces of fluid + colace  Align probiotics  Warm ginger tea no sugar   If needing prescription strength medication we will need to make an appointment with a provider.  These are over the counter medication options:  Mucinex dm green label for cough or robitussin DM  Multivitamin or below vitamins  Vitamin C 1000 mg daily.  Vitamin D3 4000 Iu (units) daily.  Zinc 100 mg daily.  Quercetin 250-500 mg 2 times per day   Elderberry  Oil of oregano  cepacol or chloroseptic spray Warm salt water gargles +hydrogen peroxide Sugar free cough drops  Warm tea with honey and lemon  Hydration  Try to eat though you dont feel like it   Tylenol or Advil  Nasal saline and Flonase 2 sprays nasal congestion  If sneezing/runny nose over the counter allergy pill claritin,allegra, zyrtec, xyzal Quarantine x 10-14 days 14 days preferred   Monitor pulse oximeter, buy from [place] if oxygen is less than 90 please go to the hospital.        Are you feeling really sick? Shortness of breath, cough, chest pain?, dizziness? Confusion   If so let me know  If worsening, go to hospital or [facility] clinic Urgent care for further treatment.

## 2021-06-23 NOTE — Progress Notes (Signed)
Chief Complaint  Patient presents with   Shortness of Breath   F/u  H/o asthma with Covid 19 + 06/06/21 with sob with exertion and chest tightness, cough and HR up to 130s on toprol xl 25 mg qd normal hr in the 60s she has been coughing and having sob with exertion on levaquin from urgent care finishes tomorrow did 5 days of prednisone 40 mg and having photosensitivity last time have covid had uveitis f/u [facility] due for appt 05/2020 last seen  She tried old albuterol and combivent inhalers for her sx's of sob as well   Migraines had severe reaction to vypti infusion 06/23/20 with chest palpitations, sore throat, tachycardia went to ed given solumedrol 125 mg x 2 benadryl and had to be on 4L O2   Review of Systems  Constitutional:  Negative for weight loss.  HENT:  Negative for hearing loss.   Eyes:  Negative for blurred vision.  Respiratory:  Positive for cough and shortness of breath.   Cardiovascular:  Negative for chest pain.  Gastrointestinal:  Negative for abdominal pain and blood in stool.  Genitourinary:  Negative for dysuria.  Musculoskeletal:  Negative for falls and joint pain.  Skin:  Negative for rash.  Neurological:  Negative for headaches.  Psychiatric/Behavioral:  Negative for depression.   Past Medical History:  Diagnosis Date   Allergy    Anemia    Anxiety    claustrophobic   Asthma    Back pain    Biceps tendonosis of right shoulder    Cataract    Mixed OU   COVID-19    covid PNA hospitalized 2020, 06/06/21   Diabetes mellitus 2011   did not start metforfin, losing weight   Dyspnea    Fatty liver    Food allergy    Headache disorder    History of concussion    HLD (hyperlipidemia)    Hypertension    Hypertensive retinopathy    OU   IBS (irritable bowel syndrome)    Infertility, female    Joint pain    Lactose intolerance    Leg edema    Migraines    Palpitation    Post-menopausal    Seborrheic dermatitis    Shoulder impingement syndrome, right     Uveitis    [place] [facility] est in 2020 seen specialist in [place] [facility] last seen 11 or 05/2020   Vitamin D deficiency    Past Surgical History:  Procedure Laterality Date   ABDOMINAL HYSTERECTOMY  2006   heavy menses, endometriosis, l oophrectomy   BACK SURGERY     BREAST BIOPSY Right 2018   benign   BREAST EXCISIONAL BIOPSY Right 2003   benign   BREAST EXCISIONAL BIOPSY Right 1999   benign   BREAST SURGERY     right breast x 2 , benign   COLONOSCOPY  10/06/2020   HERNIA REPAIR  2003   left inguinal    LEFT OOPHORECTOMY     SHOULDER ARTHROSCOPY WITH SUBACROMIAL DECOMPRESSION AND OPEN ROTATOR C Right 07/14/2016   Procedure: RIGHT SHOULDER ARTHROSCOPY WITH SUBACROMIAL DECOMPRESSION, DISTAL CLAVICLE RESECTION AND MINI OPEN ROTATOR CUFF REPAIR, OPEN BICEP TENDODESIS;  Surgeon: Garald Balding, MD;  Location: [place];  Service: Orthopedics;  Laterality: Right;   SHOULDER CLOSED REDUCTION Right 09/08/2016   Procedure: RIGHT CLOSED MANIPULATION SHOULDER;  Surgeon: Garald Balding, MD;  Location: [place];  Service: Orthopedics;  Laterality: Right;   TUBAL LIGATION  UPPER GASTROINTESTINAL ENDOSCOPY  10/06/2020   Family History  Problem Relation Age of Onset   Diabetes Mother    Heart disease Mother 33   Hypertension Mother    Hyperlipidemia Mother    Heart failure Mother    Stroke Mother    Thyroid disease Mother    Depression Mother    Sleep apnea Mother    Obesity Mother    Colon polyps Mother    Cancer Father 30       Lung Cancer   Heart disease Father    Mental illness Sister        bipolar, substance abuse,  clean 2 yrs   Hypertension Sister    Cancer Paternal Grandmother 39       breast cancer   Breast cancer Paternal Grandmother    Diabetes Maternal Grandmother    Hypertension Maternal Grandmother    Diabetes Son    Colon cancer Neg Hx    Esophageal cancer Neg Hx    Rectal cancer Neg Hx    Stomach cancer Neg Hx    Social  History   Socioeconomic History   Marital status: Divorced    Spouse name: Not on file   Number of children: 2   Years of education: 14   Highest education level: Conservator, museum/gallery (e.g., MA, MS, MEng, MEd, MSW, MBA)  Occupational History   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   Right-handed.   1-3 cups caffeine weekly.   Social Determinants of Health   Financial Resource Strain: Not on file  Food Insecurity: Not on file  Transportation Needs: Not on file  Physical Activity: Not on file  Stress: Not on file  Social Connections: Not on file  Intimate Partner Violence: Not on file   Current Meds  Medication Sig   albuterol (VENTOLIN HFA) 108 (90 Base) MCG/ACT inhaler Inhale 2 puffs into the lungs every 6 (six) hours as needed for wheezing or shortness of breath.   ALPRAZolam (XANAX) 0.5 MG tablet Take 1 tablet (0.5 mg total) by mouth 3 (three) times daily as needed (Headache).   ALPRAZolam (XANAX) 0.5 MG tablet Take 1-2 tablets 45 minutes before MRI study; 1 additional tablet can be taken at the time of the study MUST HAVE DRIVER   aspirin EC 81 MG tablet Take 81 mg by mouth daily.   atorvastatin (LIPITOR) 20 MG tablet Take 1 tablet (20 mg total) by mouth daily.   baclofen (LIORESAL) 10 MG tablet baclofen 10 mg tablet   Blood Glucose Monitoring Suppl (FREESTYLE LITE) DEVI 1 each by Does not apply route 2 (two) times daily. E11.9   Blood Pressure Monitoring (OMRON 3 SERIES BP MONITOR) DEVI Use as directed   butalbital-acetaminophen-caffeine (FIORICET) 50-325-40 MG tablet Take 1 tablet by mouth every 6 (six)  hours as needed for headache. Do not refill in less than 30 day   Continuous Blood Gluc Receiver (DEXCOM G6 RECEIVER) DEVI Use as directed.   Continuous Blood Gluc Sensor (DEXCOM G6 SENSOR) MISC Use as directed.  Change every 10 days.   Continuous Blood Gluc Transmit (DEXCOM G6 TRANSMITTER) MISC Use as directed every 90 days.   diazepam (VALIUM) 2 MG tablet Take 1 tablet (2 mg total) by mouth every 6 (six) hours as needed (Headache).   dicyclomine (BENTYL) 20 MG tablet Take 1 tablet (20 mg total) by mouth every 6 (six) hours.   EPINEPHrine 0.3 mg/0.3 mL IJ SOAJ injection Inject 0.3 mg into the muscle as needed for anaphylaxis.   Fremanezumab-vfrm (AJOVY) 225 MG/1.5ML SOSY Inject 1.5 mLs into the skin every 30 (thirty) days.   glucose blood (FREESTYLE LITE) test strip 1 each by Other route 2 (two) times daily. E11.9   insulin lispro (HUMALOG) 100 UNIT/ML KwikPen Inject 22 Units into the skin 2 (two) times daily with a meal.   Insulin Pen Needle (UNIFINE PENTIPS) 32G X 4 MM MISC Use as directed two times a day   Ipratropium-Albuterol (COMBIVENT) 20-100 MCG/ACT AERS respimat Inhale 1 puff into the lungs every 6 (six) hours.   Lancets (FREESTYLE) lancets 1 each by Other route 2 (two) times daily. E11.9   losartan-hydrochlorothiazide (HYZAAR) 50-12.5 MG tablet Take 1 tablet by mouth daily.   metoprolol succinate (TOPROL-XL) 25 MG 24 hr tablet Take 0.5 tablets (12.5 mg total) by mouth daily.   ondansetron (ZOFRAN ODT) 4 MG disintegrating tablet Take 1 tablet (4 mg total) by mouth every 8 (eight) hours as needed for nausea or vomiting.   ondansetron (ZOFRAN) 8 MG tablet Take 1 tablet (8 mg total) by mouth every 8 (eight) hours as needed for nausea or vomiting.   oxyCODONE-acetaminophen (PERCOCET) 7.5-325 MG tablet Take 1 tablet by mouth every 4 (four) hours as needed for severe pain.   oxyCODONE-acetaminophen (PERCOCET/ROXICET) 5-325 MG tablet Take by mouth as needed for severe pain.   pantoprazole  (PROTONIX) 40 MG tablet Take 1 tablet (40 mg total) by mouth daily. Take only with water and take 30 minutes before eating.   Semaglutide, 2 MG/DOSE, (OZEMPIC, 2 MG/DOSE,) 8 MG/3ML SOPN Inject 2 mg into the skin once a week.   Allergies  Allergen Reactions   Bamlanivimab Anaphylaxis, Itching, Palpitations, Rash, Shortness Of Breath and Swelling   Botox [Onabotulinumtoxina] Shortness Of Breath    syncope   Clindamycin/Lincomycin Other (See Comments)   Kiwi Extract Shortness Of Breath   Maxalt [Rizatriptan Benzoate] Anaphylaxis    Chest pain   Nitrous Oxide Shortness Of Breath    syncope   Rizatriptan Benzoate Anaphylaxis    Chest pain   Triptans Shortness Of Breath   Vyepti [Eptinezumab-Jjmr] Shortness Of Breath, Palpitations and Other (See Comments)    Throat soreness, tachycardia   Aspirin Nausea And Vomiting    Mouth blisters   Contrast Media [Iodinated Contrast Media]    Erythromycin    Flagyl [Metronidazole]    Haemophilus Influenzae Vaccines    Latex     Sometimes causes rash   Mango Flavor    Vicodin [Hydrocodone-Acetaminophen]     hallucinations   Influenza Virus Vaccine Palpitations   Penicillins Nausea And Vomiting, Rash and Other (See Comments)    Did it involve swelling of the face/tongue/throat, SOB, or low BP? No Did it involve sudden or severe rash/hives, skin peeling, or any reaction on the inside of your mouth or nose? No Did you need to seek medical attention at a hospital or doctor's office? Yes When did it last happen?     patient was 51 years old  If all above answers are NO, may  proceed with cephalosporin use.   Tetracyclines & Related Nausea And Vomiting and Rash   Recent Results (from the past 2160 hour(s))  POCT glycosylated hemoglobin (Hb A1C)     Status: Abnormal   Collection Time: 05/03/21  3:59 PM  Result Value Ref Range   Hemoglobin A1C 7.6 (A) 4.0 - 5.6 %   HbA1c POC (<> result, manual entry)     HbA1c, POC (prediabetic range)     HbA1c,  POC (controlled diabetic range)    Cervicovaginal ancillary only     Status: None   Collection Time: 05/25/21  3:59 PM  Result Value Ref Range   Neisseria Gonorrhea Negative    Chlamydia Negative    Trichomonas Negative    Comment Normal Reference Range Trichomonas - Negative    Comment Normal Reference [place] Chlamydia - Negative    Comment      Normal Reference Range Neisseria Gonorrhea - Negative  RPR+HBsAg+HCVAb+...     Status: None   Collection Time: 05/25/21  4:11 PM  Result Value Ref Range   Hepatitis B Surface Ag Negative Negative   Hep C Virus Ab <0.1 0.0 - 0.9 s/co ratio    Comment:                                   Negative:     < 0.8                              Indeterminate: 0.8 - 0.9                                   Positive:     > 0.9  HCV antibody alone does not differentiate between  previous resolved infection and active infection.  The CDC and current clinical guidelines recommend  that a positive HCV antibody result be followed up  with an HCV RNA test to support the diagnosis of  acute HCV infection. [facility] offers Hepatitis C  Virus (HCV) RNA, Diagnosis, NAA (387564) and  Hepatitis C Virus (HCV) Antibody with reflex to  Quantitative Real-time PCR (144050).    RPR Ser Ql Non Reactive Non Reactive   HIV Screen 4th Generation wRfx Non Reactive Non Reactive    Comment: HIV Negative HIV-1/HIV-2 antibodies and HIV-1 p24 antigen were NOT detected. There is no laboratory evidence of HIV infection.    Objective  Body mass index is 34.15 kg/m. Wt Readings from Last 3 Encounters:  06/23/21 211 lb 9.6 oz (96 kg)  06/17/21 210 lb (95.3 kg)  06/17/21 212 lb (96.2 kg)   Temp Readings from Last 3 Encounters:  06/23/21 (!) 97.3 F (36.3 C) (Temporal)  06/17/21 99 F (37.2 C) (Oral)  06/17/21 (!) 97.4 F (36.3 C) (Temporal)   BP Readings from Last 3 Encounters:  06/23/21 126/80  06/17/21 (!) 136/91  06/17/21 123/89   Pulse Readings from Last 3  Encounters:  06/23/21 (!) 113  06/17/21 89  06/17/21 83    Physical Exam Vitals and nursing note reviewed.  Constitutional:      Appearance: Normal appearance. She is well-developed and well-groomed.  HENT:     Head: Normocephalic and atraumatic.  Eyes:     Conjunctiva/sclera: Conjunctivae normal.     Pupils: Pupils are equal, round, and reactive to light.  Cardiovascular:     Rate and Rhythm: Normal rate and regular rhythm.     Heart sounds: Normal heart sounds. No murmur heard. Pulmonary:     Effort: Pulmonary effort is normal.     Breath sounds: Normal breath sounds.  Abdominal:     General: Abdomen is flat. Bowel sounds are normal.     Tenderness: There is no abdominal tenderness.  Musculoskeletal:        General: No tenderness.  Skin:    General: Skin is warm and dry.  Neurological:     General: No focal deficit present.     Mental Status: She is alert and oriented to person, place, and time. Mental status is at baseline.     Cranial Nerves: Cranial nerves 2-12 are intact.     Gait: Gait is intact.  Psychiatric:        Attention and Perception: Attention and perception normal.        Mood and Affect: Mood and affect normal.        Speech: Speech normal.        Behavior: Behavior normal. Behavior is cooperative.        Thought Content: Thought content normal.        Cognition and Memory: Cognition and memory normal.        Judgment: Judgment normal.    Assessment  Plan  Post-viral cough syndrome with h/o asthma likely had exacerbation   s/p covid 06/06/21 - Plan: DG Chest 2 View COVID-19 - Plan: DG Chest 2 View SOB (shortness of breath) - Plan: DG Chest 2 View, D-Dimer, Quantitative, Ipratropium-Albuterol (COMBIVENT) 20-100 MCG/ACT AERS respimat prn Will finish with levaquin tomorrow   Mild asthma with exacerbation, unspecified whether persistent - Plan: Ipratropium-Albuterol (COMBIVENT) 20-100 MCG/ACT AERS respimat  Sinus tachycardia could be due to solumedrol  125 mg x 2 and recent anaphylactic reaction Toprol xl 25 mg qd    Provider: Dr. Olivia Mackie McLean-Scocuzza-Internal Medicine

## 2021-07-01 ENCOUNTER — Other Ambulatory Visit (HOSPITAL_COMMUNITY): Payer: Self-pay

## 2021-07-16 ENCOUNTER — Other Ambulatory Visit (HOSPITAL_COMMUNITY): Payer: Self-pay

## 2021-07-19 ENCOUNTER — Telehealth: Payer: Self-pay | Admitting: Psychiatry

## 2021-07-19 NOTE — Telephone Encounter (Signed)
Appointment was rescheduled due to Dr. Billey Gosling being out.

## 2021-08-03 ENCOUNTER — Other Ambulatory Visit: Payer: Self-pay

## 2021-08-03 ENCOUNTER — Other Ambulatory Visit (HOSPITAL_COMMUNITY): Payer: Self-pay

## 2021-08-03 ENCOUNTER — Ambulatory Visit: Payer: 59 | Admitting: Endocrinology

## 2021-08-03 VITALS — BP 124/84 | HR 84 | Ht 66.0 in | Wt 214.4 lb

## 2021-08-03 DIAGNOSIS — E1165 Type 2 diabetes mellitus with hyperglycemia: Secondary | ICD-10-CM | POA: Diagnosis not present

## 2021-08-03 LAB — POCT GLYCOSYLATED HEMOGLOBIN (HGB A1C): Hemoglobin A1C: 7.5 % — AB (ref 4.0–5.6)

## 2021-08-03 MED ORDER — INSULIN LISPRO (1 UNIT DIAL) 100 UNIT/ML (KWIKPEN)
22.0000 [IU] | PEN_INJECTOR | Freq: Two times a day (BID) | SUBCUTANEOUS | 3 refills | Status: DC
Start: 2021-08-03 — End: 2022-06-30
  Filled 2021-08-03: qty 45, 94d supply, fill #0

## 2021-08-03 NOTE — Progress Notes (Signed)
Subjective:    Patient ID: [name], female    DOB: November 22, 1970, 51 y.o.   MRN: [id number]  HPI Pt returns for f/u of diabetes mellitus: DM type: Insulin-requiring type 2 Dx'ed: 3532 Complications: DN Therapy: insulin since 2018, Ozempic, and metformin.   GDM: 1990 and 2004 DKA: never Severe hypoglycemia: never Pancreatitis: never Pancreatic imaging: normal on 2019 CT.  SDOH: none Other: She did not tolerate Jardiance (vaginitis), metformin (diarrhea), or Victoza (dysphagia); she takes multiple daily injections (but she takes humalog BID, as she eats 2 meals per day); she works 1st shift.   Interval history: I reviewed continuous glucose monitor data.  Glucose varies from 110-240.  It is in general highest at 10PM, and lowest at 3-4 PM.  It increases from 9-10 PM, but there is little trend throughout the day.  She takes Humalog 22-24 units twice a day (just before each meal).  However, sometimes she skips the PM dose.  She took prednisone x 10 days, 1/23.   Past Medical History:  Diagnosis Date   Allergy    Anemia    Anxiety    claustrophobic   Asthma    Back pain    Biceps tendonosis of right shoulder    Cataract    Mixed OU   COVID-19    covid PNA hospitalized 2020, 06/06/21   Diabetes mellitus 2011   did not start metforfin, losing weight   Dyspnea    Fatty liver    Food allergy    Headache disorder    History of concussion    HLD (hyperlipidemia)    Hypertension    Hypertensive retinopathy    OU   IBS (irritable bowel syndrome)    Infertility, female    Joint pain    Lactose intolerance    Leg edema    Migraines    Palpitation    Post-menopausal    Seborrheic dermatitis    Shoulder impingement syndrome, right    Uveitis    [place] [facility] est in 2020 seen specialist in [place] [facility] last seen 11 or 05/2020   Vitamin D deficiency     Past Surgical History:  Procedure Laterality Date   ABDOMINAL HYSTERECTOMY  2006   heavy menses, endometriosis, l  oophrectomy   BACK SURGERY     BREAST BIOPSY Right 2018   benign   BREAST EXCISIONAL BIOPSY Right 2003   benign   BREAST EXCISIONAL BIOPSY Right 1999   benign   BREAST SURGERY     right breast x 2 , benign   COLONOSCOPY  10/06/2020   HERNIA REPAIR  2003   left inguinal    LEFT OOPHORECTOMY     SHOULDER ARTHROSCOPY WITH SUBACROMIAL DECOMPRESSION AND OPEN ROTATOR C Right 07/14/2016   Procedure: RIGHT SHOULDER ARTHROSCOPY WITH SUBACROMIAL DECOMPRESSION, DISTAL CLAVICLE RESECTION AND MINI OPEN ROTATOR CUFF REPAIR, OPEN BICEP TENDODESIS;  Surgeon: Garald Balding, MD;  Location: [facility];  Service: Orthopedics;  Laterality: Right;   SHOULDER CLOSED REDUCTION Right 09/08/2016   Procedure: RIGHT CLOSED MANIPULATION SHOULDER;  Surgeon: Garald Balding, MD;  Location: [facility];  Service: Orthopedics;  Laterality: Right;   TUBAL LIGATION     UPPER GASTROINTESTINAL ENDOSCOPY  10/06/2020    Social History   Socioeconomic History   Marital status: Divorced    Spouse name: Not on file   Number of children: 2   Years of education: 14   Highest education level: Master's degree (e.g.,  MA, MS, MEng, MEd, MSW, MBA)  Occupational History   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   Right-handed.   1-3 cups caffeine weekly.   Social Determinants of Health   Financial Resource Strain: Not on file  Food Insecurity: Not on file  Transportation Needs: Not on file  Physical Activity: Not on file  Stress: Not on file  Social Connections: Not on file  Intimate Partner Violence: Not on file     Current Outpatient Medications on File Prior to Visit  Medication Sig Dispense Refill   albuterol (VENTOLIN HFA) 108 (90 Base) MCG/ACT inhaler Inhale 2 puffs into the lungs every 6 (six) hours as needed for wheezing or shortness of breath. 18 g 0   ALPRAZolam (XANAX) 0.5 MG tablet Take 1 tablet (0.5 mg total) by mouth 3 (three) times daily as needed (Headache).     ALPRAZolam (XANAX) 0.5 MG tablet Take 1-2 tablets 45 minutes before MRI study; 1 additional tablet can be taken at the time of the study MUST HAVE DRIVER 3 tablet 0   aspirin EC 81 MG tablet Take 81 mg by mouth daily.     atorvastatin (LIPITOR) 20 MG tablet Take 1 tablet (20 mg total) by mouth daily. 90 tablet 3   baclofen (LIORESAL) 10 MG tablet baclofen 10 mg tablet     Blood Glucose Monitoring Suppl (FREESTYLE LITE) DEVI 1 each by Does not apply route 2 (two) times daily. E11.9 1 each 0   Blood Pressure Monitoring (OMRON 3 SERIES BP MONITOR) DEVI Use as directed 1 each 0   butalbital-acetaminophen-caffeine (FIORICET) 50-325-40 MG tablet Take 1 tablet by mouth every 6 (six) hours as needed for headache. Do not refill in less than 30 day 30 tablet 1   Continuous Blood Gluc Receiver (DEXCOM G6 RECEIVER) DEVI Use as directed. 1 each 1   Continuous Blood Gluc Sensor (DEXCOM G6 SENSOR) MISC Use as directed. Change every 10 days. 9 each 3   Continuous Blood Gluc Transmit (DEXCOM G6 TRANSMITTER) MISC Use as directed every 90 days. 1 each 1   diazepam (VALIUM) 2 MG tablet Take 1 tablet (2 mg total) by mouth every 6 (six) hours as needed (Headache). 30 tablet 5   dicyclomine (BENTYL) 20 MG tablet Take 1 tablet (20 mg total) by mouth every 6 (six) hours. 90 tablet 1   EPINEPHrine 0.3 mg/0.3 mL IJ SOAJ injection Inject 0.3 mg into the muscle as needed for anaphylaxis. 1 each 0   Fremanezumab-vfrm (AJOVY) 225 MG/1.5ML SOSY Inject 1.5 mLs into the skin every 30 (thirty) days. 1.5 mL 6   glucose blood (FREESTYLE LITE) test strip 1 each by  Other route 2 (two) times daily. E11.9 200 each 0   Insulin Pen Needle (UNIFINE PENTIPS) 32G X 4 MM MISC Use as directed two times a day 100 each 4   Ipratropium-Albuterol (COMBIVENT) 20-100 MCG/ACT AERS respimat Inhale 1 puff into the lungs every 6 (six) hours. 4 g 11   Lancets (FREESTYLE) lancets 1 each by Other route  2 (two) times daily. E11.9 200 each 0   losartan-hydrochlorothiazide (HYZAAR) 50-12.5 MG tablet Take 1 tablet by mouth daily. 90 tablet 1   metoprolol succinate (TOPROL-XL) 25 MG 24 hr tablet Take 0.5 tablets (12.5 mg total) by mouth daily. 45 tablet 2   ondansetron (ZOFRAN ODT) 4 MG disintegrating tablet Take 1 tablet (4 mg total) by mouth every 8 (eight) hours as needed for nausea or vomiting. 20 tablet 0   ondansetron (ZOFRAN) 8 MG tablet Take 1 tablet (8 mg total) by mouth every 8 (eight) hours as needed for nausea or vomiting. 20 tablet 0   oxyCODONE-acetaminophen (PERCOCET) 7.5-325 MG tablet Take 1 tablet by mouth every 4 (four) hours as needed for severe pain. 15 tablet 0   oxyCODONE-acetaminophen (PERCOCET/ROXICET) 5-325 MG tablet Take by mouth as needed for severe pain.     pantoprazole (PROTONIX) 40 MG tablet Take 1 tablet (40 mg total) by mouth daily. Take only with water and take 30 minutes before eating. 90 tablet 3   Semaglutide, 2 MG/DOSE, (OZEMPIC, 2 MG/DOSE,) 8 MG/3ML SOPN Inject 2 mg into the skin once a week. 9 mL 3   OXcarbazepine ER 150 MG TB24 TAKE 1 TABLET BY MOUTH DAILY AT BEDTIME 30 tablet 5   No current facility-administered medications on file prior to visit.    Allergies  Allergen Reactions   Bamlanivimab Anaphylaxis, Itching, Palpitations, Rash, Shortness Of Breath and Swelling   Botox [Onabotulinumtoxina] Shortness Of Breath    syncope   Clindamycin/Lincomycin Other (See Comments)   Kiwi Extract Shortness Of Breath   Maxalt [Rizatriptan Benzoate] Anaphylaxis    Chest pain   Nitrous Oxide Shortness Of Breath    syncope   Rizatriptan Benzoate  Anaphylaxis    Chest pain   Triptans Shortness Of Breath   Vyepti [Eptinezumab-Jjmr] Shortness Of Breath, Palpitations and Other (See Comments)    Throat soreness, tachycardia   Aspirin Nausea And Vomiting    Mouth blisters   Contrast Media [Iodinated Contrast Media]    Erythromycin    Flagyl [Metronidazole]    Haemophilus Influenzae Vaccines    Latex     Sometimes causes rash   Mango Flavor    Vicodin [Hydrocodone-Acetaminophen]     hallucinations   Influenza Virus Vaccine Palpitations   Penicillins Nausea And Vomiting, Rash and Other (See Comments)    Did it involve swelling of the face/tongue/throat, SOB, or low BP? No Did it involve sudden or severe rash/hives, skin peeling, or any reaction on the inside of your mouth or nose? No Did you need to seek medical attention at a hospital or doctor's office? Yes When did it last happen?     patient was 51 years old  If all above answers are NO, may proceed with cephalosporin use.   Tetracyclines & Related Nausea And Vomiting and Rash    Family History  Problem Relation Age of Onset   Diabetes Mother    Heart disease Mother 90   Hypertension Mother    Hyperlipidemia Mother    Heart failure Mother    Stroke Mother    Thyroid disease Mother    Depression Mother    Sleep apnea Mother    Obesity Mother    Colon polyps Mother    Cancer Father 82       Lung Cancer   Heart disease Father    Mental illness Sister        bipolar, substance abuse,  clean 2 yrs   Hypertension Sister  Cancer Paternal Grandmother 28       breast cancer   Breast cancer Paternal Grandmother    Diabetes Maternal Grandmother    Hypertension Maternal Grandmother    Diabetes Son    Colon cancer Neg Hx    Esophageal cancer Neg Hx    Rectal cancer Neg Hx    Stomach cancer Neg Hx     BP 124/84    Pulse 84    Ht 5\' 6"  (1.676 m)    Wt 214 lb 6.4 oz (97.3 kg)    SpO2 95%    BMI 34.61 kg/m    Review of Systems She denies hypoglycemia     Objective:   Physical Exam    A1c=7.5%    Assessment & Plan:  Insulin-requiring type 2 DM: well-controlled Hyperglycemia, due to prednisone  Patient Instructions  Rather than skipping the PM insulin, you should take a minimum of 10 units.   Please continue the same Ozempic.   While on the prednisone, you should take extra Humalog: 4 extra units if in the 200's, and 8 extra if over 300.   check your blood sugar twice a day.  vary the time of day when you check, between before the 3 meals, and at bedtime.  also check if you have symptoms of your blood sugar being too high or too low.  please keep a record of the readings and bring it to your next appointment here (or you can bring the meter itself).  You can write it on any piece of paper.  please call us sooner if your blood sugar goes below 70, or if you have a lot of readings over 200.   Please come back for a follow-up appointment in May.

## 2021-08-03 NOTE — Patient Instructions (Addendum)
Rather than skipping the PM insulin, you should take a minimum of 10 units.   Please continue the same Ozempic.   While on the prednisone, you should take extra Humalog: 4 extra units if in the 200's, and 8 extra if over 300.   check your blood sugar twice a day.  vary the time of day when you check, between before the 3 meals, and at bedtime.  also check if you have symptoms of your blood sugar being too high or too low.  please keep a record of the readings and bring it to your next appointment here (or you can bring the meter itself).  You can write it on any piece of paper.  please call us sooner if your blood sugar goes below 70, or if you have a lot of readings over 200.   Please come back for a follow-up appointment in May.

## 2021-08-04 ENCOUNTER — Ambulatory Visit: Payer: 59 | Admitting: Psychiatry

## 2021-08-04 NOTE — Progress Notes (Deleted)
? ?  CC:  headaches ? ?Follow-up Visit ? ?Last visit: 05/05/22 ? ?Brief HPI: ?51 year old female with a history of HTN, HLD, DM, asthma who follows in clinic for chronic migraines. ? ?At her last visit MRI brain was ordered. She was started on Vyepti 100 mg for prevention. Ubrelvy sample given for rescue. She was referred to neck PT. ? ?Interval History: ?The patient was unable to tolerate Vyepti as she developed chest pain during her infusion. ? ?Brain MRI 05/26/21 was unremarkable. ? ?Headache days per month: *** ?Headache free days per month: *** ?Headache severity: *** ? ?Current Headache Regimen: ?Preventative: *** ?Abortive: *** ? ?# of doses of abortive medications per month: *** ? ?Prior Therapies                                  ?Baclofen ?fioricet ?Ajovy ?Emgality ?Vyepti - chest pain ?Botox - bradycardia, chest pain, shortness of breath ?Topamax - eye pressure ?Zonisamide ?Effexor - diarrhea ?Nortriptyline ?Verapamil ?Propranolol ?Gabapentin 600 mg BID ?Maxalt ?Nurtec ? ?Physical Exam:  ? ?Vital Signs: ?There were no vitals taken for this visit. ?GENERAL:  well appearing, in no acute distress, alert  ?SKIN:  Color, texture, turgor normal. No rashes or lesions ?HEAD:  Normocephalic/atraumatic. ?RESP: normal respiratory effort ?MSK:  No gross joint deformities.  ? ?NEUROLOGICAL: ?Mental Status: Alert, oriented to person, place and time, Follows commands, and Speech fluent and appropriate. ?Cranial Nerves: PERRL, face symmetric, no dysarthria, hearing grossly intact ?Motor: moves all extremities equally ?Gait: normal-based. ? ?IMPRESSION: ?*** ? ?PLAN: ?*** ?Theodoro Doing, restart ajovy***consider trudhesa ? ?Follow-up: *** ? ?I spent a total of *** minutes on the date of the service. Headache education was done. Discussed lifestyle modification including increased oral hydration, decreased caffeine, exercise and stress management. Discussed treatment options including preventive and acute medications,  natural supplements, and infusion therapy. Discussed medication overuse headache and to limit use of acute treatments to no more than 2 days/week or 10 days/month. Discussed medication side effects, adverse reactions and drug interactions. Written educational materials and patient instructions outlining all of the above were given. ? ?Genia Harold, MD ? ?

## 2021-08-09 ENCOUNTER — Encounter: Payer: Self-pay | Admitting: Endocrinology

## 2021-08-11 ENCOUNTER — Ambulatory Visit: Payer: 59 | Admitting: Psychiatry

## 2021-08-24 ENCOUNTER — Other Ambulatory Visit (HOSPITAL_COMMUNITY): Payer: Self-pay

## 2021-08-25 ENCOUNTER — Encounter: Payer: Self-pay | Admitting: Psychiatry

## 2021-08-25 ENCOUNTER — Other Ambulatory Visit (HOSPITAL_COMMUNITY): Payer: Self-pay

## 2021-08-30 ENCOUNTER — Other Ambulatory Visit (HOSPITAL_COMMUNITY): Payer: Self-pay

## 2021-08-30 ENCOUNTER — Telehealth: Payer: Self-pay | Admitting: *Deleted

## 2021-08-30 NOTE — Telephone Encounter (Signed)
Received intermittent FMLA papers for patient. Placed on Dr Georgina Peer desk for completion.  ?

## 2021-08-31 ENCOUNTER — Encounter: Payer: Self-pay | Admitting: *Deleted

## 2021-08-31 NOTE — Telephone Encounter (Signed)
[employer] FMLA completed, signed by Dr  Billey Gosling. Sent to medical records for processing.  ?

## 2021-09-01 DIAGNOSIS — Z0289 Encounter for other administrative examinations: Secondary | ICD-10-CM

## 2021-09-02 ENCOUNTER — Ambulatory Visit (HOSPITAL_COMMUNITY): Payer: 59

## 2021-09-06 ENCOUNTER — Other Ambulatory Visit (HOSPITAL_COMMUNITY): Payer: Self-pay

## 2021-09-16 ENCOUNTER — Inpatient Hospital Stay (HOSPITAL_COMMUNITY): Admission: RE | Admit: 2021-09-16 | Payer: 59 | Source: Ambulatory Visit

## 2021-09-20 ENCOUNTER — Ambulatory Visit: Payer: 59 | Admitting: Psychiatry

## 2021-09-21 ENCOUNTER — Ambulatory Visit: Payer: 59 | Admitting: Internal Medicine

## 2021-09-21 ENCOUNTER — Encounter: Payer: Self-pay | Admitting: Internal Medicine

## 2021-09-21 ENCOUNTER — Other Ambulatory Visit (HOSPITAL_COMMUNITY): Payer: Self-pay

## 2021-09-21 VITALS — BP 118/88 | HR 91 | Temp 97.9°F | Ht 66.0 in | Wt 219.8 lb

## 2021-09-21 DIAGNOSIS — G8929 Other chronic pain: Secondary | ICD-10-CM | POA: Diagnosis not present

## 2021-09-21 DIAGNOSIS — E785 Hyperlipidemia, unspecified: Secondary | ICD-10-CM

## 2021-09-21 DIAGNOSIS — H3023 Posterior cyclitis, bilateral: Secondary | ICD-10-CM | POA: Diagnosis not present

## 2021-09-21 DIAGNOSIS — U099 Post covid-19 condition, unspecified: Secondary | ICD-10-CM | POA: Diagnosis not present

## 2021-09-21 DIAGNOSIS — R438 Other disturbances of smell and taste: Secondary | ICD-10-CM

## 2021-09-21 DIAGNOSIS — L853 Xerosis cutis: Secondary | ICD-10-CM | POA: Diagnosis not present

## 2021-09-21 DIAGNOSIS — M25512 Pain in left shoulder: Secondary | ICD-10-CM

## 2021-09-21 DIAGNOSIS — E559 Vitamin D deficiency, unspecified: Secondary | ICD-10-CM | POA: Diagnosis not present

## 2021-09-21 DIAGNOSIS — E1169 Type 2 diabetes mellitus with other specified complication: Secondary | ICD-10-CM

## 2021-09-21 MED ORDER — CELECOXIB 200 MG PO CAPS
200.0000 mg | ORAL_CAPSULE | Freq: Every day | ORAL | 2 refills | Status: DC
  Filled 2021-09-21: qty 30, 30d supply, fill #0

## 2021-09-21 MED ORDER — PANTOPRAZOLE SODIUM 40 MG PO TBEC
40.0000 mg | DELAYED_RELEASE_TABLET | Freq: Every day | ORAL | 3 refills | Status: DC
  Filled 2021-09-21: qty 90, 90d supply, fill #0

## 2021-09-21 MED ORDER — TRAZODONE HCL 50 MG PO TABS
25.0000 mg | ORAL_TABLET | Freq: Every evening | ORAL | 0 refills | Status: DC | PRN
Start: 2021-09-21 — End: 2022-03-05
  Filled 2021-09-21: qty 90, 90d supply, fill #0

## 2021-09-21 NOTE — Assessment & Plan Note (Signed)
Managed with atorvastatin 20 mg.   ?

## 2021-09-21 NOTE — Assessment & Plan Note (Signed)
Her sense of smell and taste have not recovered since her infection in 2020 and has resulted in weight gain,  Altered diet,  And frustration .  Referring to [place] clinic at [facility]  ?
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
## 2021-09-21 NOTE — Patient Instructions (Signed)
I am referring you to [place] [facility], and to the [facility]o clinic at [facility] for your shoulder ? ? ?You can combine celebrex with tylenol safely.   As long as you are taking celebrx OR ibuprofen,  take the pantoprazole to prevent gastritis  ? ? ?You can add /combine up to 2000 mg of acetominophen (tylenol) every day safely  In divided doses (500 mg every 6 hours  Or 1000 mg every 12 hours.)  ? ? ?

## 2021-09-21 NOTE — Assessment & Plan Note (Addendum)
Rotator cuff tendonitis suspected.  Refer to orthopedics . Refilling celebrex  ?

## 2021-09-21 NOTE — Assessment & Plan Note (Signed)
Resolving after nearly a year of pain and inflammation  ?

## 2021-09-22 LAB — COMPREHENSIVE METABOLIC PANEL
ALT: 14 U/L (ref 0–35)
AST: 14 U/L (ref 0–37)
Albumin: 4.3 g/dL (ref 3.5–5.2)
Alkaline Phosphatase: 132 U/L — ABNORMAL HIGH (ref 39–117)
BUN: 14 mg/dL (ref 6–23)
CO2: 26 mEq/L (ref 19–32)
Calcium: 9.3 mg/dL (ref 8.4–10.5)
Chloride: 102 mEq/L (ref 96–112)
Creatinine, Ser: 1.12 mg/dL (ref 0.40–1.20)
GFR: 56.68 mL/min — ABNORMAL LOW (ref >60.00)
Glucose, Bld: 200 mg/dL — ABNORMAL HIGH (ref 70–99)
Potassium: 3.6 mEq/L (ref 3.5–5.1)
Sodium: 139 mEq/L (ref 135–145)
Total Bilirubin: 0.4 mg/dL (ref 0.2–1.2)
Total Protein: 6.7 g/dL (ref 6.0–8.3)

## 2021-09-22 LAB — THYROID PANEL WITH TSH
Free Thyroxine Index: 1.9 (ref 1.4–3.8)
T3 Uptake: 32 % (ref 22–35)
T4, Total: 5.8 ug/dL (ref 5.1–11.9)
TSH: 0.48 mIU/L

## 2021-09-22 LAB — LIPID PANEL
Cholesterol: 141 mg/dL (ref 0–200)
HDL: 34.2 mg/dL — ABNORMAL LOW (ref >39.00)
NonHDL: 107.16
Total CHOL/HDL Ratio: 4
Triglycerides: 248 mg/dL — ABNORMAL HIGH (ref 0.0–149.0)
VLDL: 49.6 mg/dL — ABNORMAL HIGH (ref 0.0–40.0)

## 2021-09-22 LAB — VITAMIN D 25 HYDROXY (VIT D DEFICIENCY, FRACTURES): VITD: 11.2 ng/mL — ABNORMAL LOW (ref 30.00–100.00)

## 2021-09-22 LAB — LDL CHOLESTEROL, DIRECT: Direct LDL: 88 mg/dL

## 2021-09-23 ENCOUNTER — Encounter: Payer: Self-pay | Admitting: Internal Medicine

## 2021-09-24 ENCOUNTER — Other Ambulatory Visit: Payer: Self-pay | Admitting: Internal Medicine

## 2021-09-24 ENCOUNTER — Other Ambulatory Visit (HOSPITAL_COMMUNITY): Payer: Self-pay

## 2021-09-24 NOTE — Telephone Encounter (Signed)
Not in current medication list. ?Refilled: 06/10/2014 ?Last OV: 09/21/2021 ?Next OV: not scheduled ?

## 2021-09-26 MED ORDER — ERGOCALCIFEROL 1.25 MG (50000 UT) PO CAPS
50000.0000 [IU] | ORAL_CAPSULE | ORAL | 2 refills | Status: DC
  Filled 2021-09-26: qty 4, 28d supply, fill #0
  Filled 2021-11-24: qty 4, 28d supply, fill #1
  Filled 2021-12-20: qty 4, 28d supply, fill #2

## 2021-09-26 NOTE — Addendum Note (Signed)
Addended by: Crecencio Mc on: 09/26/2021 12:19 AM ? ? Modules accepted: Orders ? ?

## 2021-09-26 NOTE — Assessment & Plan Note (Signed)
Repeat Drisdol 50,000 IUs weekly  ?

## 2021-09-27 ENCOUNTER — Other Ambulatory Visit (HOSPITAL_COMMUNITY): Payer: Self-pay

## 2021-09-29 ENCOUNTER — Ambulatory Visit (HOSPITAL_BASED_OUTPATIENT_CLINIC_OR_DEPARTMENT_OTHER)
Admission: RE | Admit: 2021-09-29 | Discharge: 2021-09-29 | Disposition: A | Discharge status: Home / Self Care | Payer: 59 | Source: Ambulatory Visit | Attending: Orthopaedic Surgery | Admitting: Orthopaedic Surgery

## 2021-09-29 ENCOUNTER — Ambulatory Visit (HOSPITAL_BASED_OUTPATIENT_CLINIC_OR_DEPARTMENT_OTHER): Payer: 59 | Admitting: Orthopaedic Surgery

## 2021-09-29 ENCOUNTER — Other Ambulatory Visit (HOSPITAL_BASED_OUTPATIENT_CLINIC_OR_DEPARTMENT_OTHER): Payer: Self-pay | Admitting: Orthopaedic Surgery

## 2021-09-29 ENCOUNTER — Other Ambulatory Visit (HOSPITAL_COMMUNITY): Payer: Self-pay

## 2021-09-29 DIAGNOSIS — M25512 Pain in left shoulder: Secondary | ICD-10-CM | POA: Insufficient documentation

## 2021-09-29 DIAGNOSIS — M7502 Adhesive capsulitis of left shoulder: Secondary | ICD-10-CM

## 2021-09-29 MED ORDER — MELOXICAM 15 MG PO TABS
15.0000 mg | ORAL_TABLET | Freq: Every day | ORAL | 5 refills | Status: DC
Start: 2021-09-29 — End: 2023-04-06
  Filled 2021-09-29: qty 30, 30d supply, fill #0

## 2021-09-29 NOTE — Progress Notes (Signed)
? ?                            ? ? ?Chief Complaint: Left shoulder pain ?  ? ? ?History of Present Illness:  ? ? ?[name] is a 51 y.o. female right-hand-dominant presents with left shoulder pain that has been ongoing for 6 months now.  She has no known injury.  She does have a history of diabetes.  She states that she had previously had right shoulder rotator cuff repair and has subsequently used her left arm in order to compensate.  She is scheduled for trip to [place] the upcoming day to go see her daughter's cheer competition.  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  She is status post right shoulder rotator cuff repair done with Dr. Durward Fortes in 2018 ? ? ? ?Surgical History:   ?Right shoulder rotator cuff repair done in 2018 ? ?PMH/PSH/Family History/Social History/Meds/Allergies:   ? ?Past Medical History:  ?Diagnosis Date  ? Allergy   ? Anemia   ? Anxiety   ? claustrophobic  ? Asthma   ? Back pain   ? Biceps tendonosis of right shoulder   ? Cataract   ? Mixed OU  ? COVID-19   ? covid PNA hospitalized 2020, 06/06/21  ? Diabetes mellitus 2011  ? did not start metforfin, losing weight  ? Dyspnea   ? Fatty liver   ? Food allergy   ? Headache disorder   ? History of concussion   ? HLD (hyperlipidemia)   ? Hypertension   ? Hypertensive retinopathy   ? OU  ? IBS (irritable bowel syndrome)   ? Infertility, female   ? Joint pain   ? Lactose intolerance   ? Leg edema   ? Migraines   ? Palpitation   ? Post-menopausal   ? Seborrheic dermatitis   ? Shoulder impingement syndrome, right   ? Uveitis   ? [place] eye est in 2020 seen specialist in [place] [facility] last seen 11 or 05/2020  ? Vitamin D deficiency   ? ?Past Surgical History:  ?Procedure Laterality Date  ? ABDOMINAL HYSTERECTOMY  2006  ? heavy menses, endometriosis, l oophrectomy  ? BACK SURGERY    ? BREAST BIOPSY Right 2018  ? benign  ? BREAST EXCISIONAL BIOPSY Right 2003  ? benign  ? BREAST EXCISIONAL BIOPSY Right 1999  ? benign  ? BREAST  SURGERY    ? right breast x 2 , benign  ? COLONOSCOPY  10/06/2020  ? HERNIA REPAIR  2003  ? left inguinal   ? LEFT OOPHORECTOMY    ? SHOULDER ARTHROSCOPY WITH SUBACROMIAL DECOMPRESSION AND OPEN ROTATOR C Right 07/14/2016  ? Procedure: RIGHT SHOULDER ARTHROSCOPY WITH SUBACROMIAL DECOMPRESSION, DISTAL CLAVICLE RESECTION AND MINI OPEN ROTATOR CUFF REPAIR, OPEN BICEP TENDODESIS;  Surgeon: Garald Balding, MD;  Location: [facility];  Service: Orthopedics;  Laterality: Right;  ? SHOULDER CLOSED REDUCTION Right 09/08/2016  ? Procedure: RIGHT CLOSED MANIPULATION SHOULDER;  Surgeon: Garald Balding, MD;  Location: [facility];  Service: Orthopedics;  Laterality: Right;  ? TUBAL LIGATION    ? UPPER GASTROINTESTINAL ENDOSCOPY  10/06/2020  ? ?Social History  ? ?Socioeconomic History  ? Marital status: Divorced  ?  Spouse name: Not on file  ? Number of children: 2  ? Years of education: 69  ? Highest education level: Master's degree (e.g., MA, MS,  MEng, MEd, MSW, [facility])  ?Occupational History  ? 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 Smoking status: Never  ? Smokeless tobacco: Never  ?Vaping Use  ? Vaping Use: Never used  ?Substance and Sexual Activity  ? Alcohol use: No  ? Drug use: No  ? Sexual activity: Not Currently  ?  Partners: Male  ?  Birth control/protection: Surgical  ?Other Topics Concern  ? Not on file  ?Social History Narrative  ? Lives at home with son and daughter.  ? Right-handed.  ? 1-3 cups caffeine weekly.  ? ?Social Determinants of Health  ? ?Financial Resource Strain: Not on file  ?Food Insecurity: Not on file  ?Transportation Needs: Not on file  ?Physical Activity: Not on file  ?Stress: Not on file  ?Social Connections: Not on file  ? ?Family History  ?Problem Relation Age of Onset  ? Diabetes Mother   ? Heart disease Mother 20  ? Hypertension Mother   ? Hyperlipidemia Mother   ? Heart failure Mother   ? Stroke Mother   ? Thyroid disease Mother   ?  Depression Mother   ? Sleep apnea Mother   ? Obesity Mother   ? Colon polyps Mother   ? Cancer Father 11  ?     Lung Cancer  ? Heart disease Father   ? Mental illness Sister   ?     bipolar, substance abuse,  clean 2 yrs  ? Hypertension Sister   ? Cancer Paternal Grandmother 58  ?     breast cancer  ? Breast cancer Paternal Grandmother   ? Diabetes Maternal Grandmother   ? Hypertension Maternal Grandmother   ? Diabetes Son   ? Colon cancer Neg Hx   ? Esophageal cancer Neg Hx   ? Rectal cancer Neg Hx   ? Stomach cancer Neg Hx   ? ?Allergies  ?Allergen Reactions  ? Bamlanivimab Anaphylaxis, Itching, Palpitations, Rash, Shortness Of Breath and Swelling  ? Botox [Onabotulinumtoxina] Shortness Of Breath  ?  syncope  ? Clindamycin/Lincomycin Other (See Comments)  ? Kiwi Extract Shortness Of Breath  ? Maxalt [Rizatriptan Benzoate] Anaphylaxis  ?  Chest pain  ? Nitrous Oxide Shortness Of Breath  ?  syncope  ? Rizatriptan Benzoate Anaphylaxis  ?  Chest pain  ? Triptans Shortness Of Breath  ? Vyepti [Eptinezumab-Jjmr] Shortness Of Breath, Palpitations and Other (See Comments)  ?  Throat soreness, tachycardia  ? Aspirin Nausea And Vomiting  ?  Mouth blisters  ? Contrast Media [Iodinated Contrast Media]   ? Erythromycin   ? Flagyl [Metronidazole]   ? Haemophilus Influenzae Vaccines   ? Latex   ?  Sometimes causes rash  ? Mango Flavor   ? Vicodin [Hydrocodone-Acetaminophen]   ?  hallucinations  ? Influenza Virus Vaccine Palpitations  ? Penicillins Nausea And Vomiting, Rash and Other (See Comments)  ?  Did it involve swelling of the face/tongue/throat, SOB, or low BP? No ?Did it involve sudden or severe rash/hives, skin peeling, or any reaction on the inside of your mouth or nose? No ?Did you need to seek medical attention at a hospital or doctor's office? Yes ?When did it last happen?     patient was 51 years old  ?If all above answers are ?NO?, may proceed with cephalosporin use.  ? Tetracyclines & Related Nausea And Vomiting  and Rash  ? ?Current Outpatient Medications  ?Medication Sig Dispense Refill  ?  albuterol (VENTOLIN HFA) 108 (90 Base) MCG/ACT inhaler Inhale 2 puffs into the lungs every 6 (six) hours as needed for wheezing or shortness of breath. 18 g 0  ? ALPRAZolam (XANAX) 0.5 MG tablet Take 1 tablet (0.5 mg total) by mouth 3 (three) times daily as needed (Headache).    ? ALPRAZolam (XANAX) 0.5 MG tablet Take 1-2 tablets 45 minutes before MRI study; 1 additional tablet can be taken at the time of the study MUST HAVE DRIVER 3 tablet 0  ? aspirin EC 81 MG tablet Take 81 mg by mouth daily.    ? atorvastatin (LIPITOR) 20 MG tablet Take 1 tablet (20 mg total) by mouth daily. 90 tablet 3  ? baclofen (LIORESAL) 10 MG tablet baclofen 10 mg tablet    ? Blood Glucose Monitoring Suppl (FREESTYLE LITE) DEVI 1 each by Does not apply route 2 (two) times daily. E11.9 1 each 0  ? Blood Pressure Monitoring (OMRON 3 SERIES BP MONITOR) DEVI Use as directed 1 each 0  ? butalbital-acetaminophen-caffeine (FIORICET) 50-325-40 MG tablet Take 1 tablet by mouth every 6 (six) hours as needed for headache. Do not refill in less than 30 day 30 tablet 1  ? Continuous Blood Gluc Receiver (DEXCOM G6 RECEIVER) DEVI Use as directed. 1 each 1  ? Continuous Blood Gluc Sensor (DEXCOM G6 SENSOR) MISC Use as directed. Change every 10 days. 9 each 3  ? Continuous Blood Gluc Transmit (DEXCOM G6 TRANSMITTER) MISC Use as directed every 90 days. 1 each 1  ? diazepam (VALIUM) 2 MG tablet Take 1 tablet (2 mg total) by mouth every 6 (six) hours as needed (Headache). 30 tablet 5  ? dicyclomine (BENTYL) 20 MG tablet Take 1 tablet (20 mg total) by mouth every 6 (six) hours. 90 tablet 1  ? EPINEPHrine 0.3 mg/0.3 mL IJ SOAJ injection Inject 0.3 mg into the muscle as needed for anaphylaxis. 1 each 0  ? ergocalciferol (DRISDOL) 1.25 MG (50000 UT) capsule Take 1 capsule (50,000 Units total) by mouth once a week. 4 capsule 2  ? Fremanezumab-vfrm (AJOVY) 225 MG/1.5ML SOSY Inject 1.5  mLs into the skin every 30 (thirty) days. 1.5 mL 6  ? glucose blood (FREESTYLE LITE) test strip 1 each by Other route 2 (two) times daily. E11.9 200 each 0  ? insulin lispro (HUMALOG) 100 UNIT/ML KwikPen Injec

## 2021-09-29 NOTE — Telephone Encounter (Signed)
Meloxicam sent

## 2021-10-05 ENCOUNTER — Ambulatory Visit (INDEPENDENT_AMBULATORY_CARE_PROVIDER_SITE_OTHER): Payer: 59 | Admitting: Psychiatry

## 2021-10-05 ENCOUNTER — Encounter: Payer: Self-pay | Admitting: Psychiatry

## 2021-10-05 ENCOUNTER — Other Ambulatory Visit (HOSPITAL_COMMUNITY): Payer: Self-pay

## 2021-10-05 VITALS — BP 121/75 | HR 93 | Ht 66.0 in | Wt 212.8 lb

## 2021-10-05 DIAGNOSIS — M542 Cervicalgia: Secondary | ICD-10-CM

## 2021-10-05 DIAGNOSIS — G43119 Migraine with aura, intractable, without status migrainosus: Secondary | ICD-10-CM

## 2021-10-05 MED ORDER — QULIPTA 60 MG PO TABS
60.0000 mg | ORAL_TABLET | Freq: Every day | ORAL | 3 refills | Status: DC
  Filled 2021-10-05 – 2021-11-04 (×3): qty 30, 30d supply, fill #0
  Filled 2021-12-20: qty 30, 30d supply, fill #1
  Filled 2022-02-02: qty 30, 30d supply, fill #2
  Filled 2022-03-22: qty 30, 30d supply, fill #3

## 2021-10-05 NOTE — Progress Notes (Signed)
? ?  CC:  headaches ? ?Follow-up Visit ? ?Last visit: 05/05/21 ? 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 ? ?At her last visit she was started on Vyepti for prevention and brain MRI was ordered. ? ?Interval History: ?She developed chest pain and trouble swallowing with Vyepti, so it was discontinued during her first infusion. She is currently not taking anything for prevention. She currently has ~3 migraines per week and continues to have facial numbness with them. ? ?Excedrin has not been working lately. She will occasionally take Fioricet and baclofen for severe headaches. Cannot take triptans due to allergy. Did not try Ubrelvy sample out of concern for side effects. ? ?She never received a call to schedule neck PT. ? ? ?Headache days per month: 12 ?Headache free days per month: 18 ? ?Current Headache Regimen: ?Preventative: none ?Abortive: Excedrin ? ?Prior Therapies                                  ?Baclofen ?fioricet ?Ajovy ?Emgality ?Vyepti - chest pain ?Botox - bradycardia, chest pain, shortness of breath ?Topamax - eye pressure ?Zonisamide ?Effexor - diarrhea ?Nortriptyline ?Verapamil ?Propranolol ?Maxalt ?Nurtec ? ?Physical Exam:  ? ?Vital Signs: ?BP 121/75   Pulse 93   Ht '5\' 6"'$  (1.676 m)   Wt 212 lb 12.8 oz (96.5 kg)   BMI 34.35 kg/m?  ?GENERAL:  well appearing, in no acute distress, alert  ?SKIN:  Color, texture, turgor normal. No rashes or lesions ?HEAD:  Normocephalic/atraumatic. ?RESP: normal respiratory effort ?MSK:  No gross joint deformities.  ? ?NEUROLOGICAL: ?Mental Status: Alert, oriented to person, place and time, Follows commands, and Speech fluent and appropriate. ?Cranial Nerves: PERRL, face symmetric, no dysarthria, hearing grossly intact ?Motor: moves all extremities equally ?Gait: normal-based. ? ?IMPRESSION: ?51 year old female with a history of  HTN, HLD, DM, asthma who presents for follow  up of migraines. She was unable to tolerate Vyepti due to side effects. Will start Qulipta for headache prevention. She has significant occiput and neck tenderness bilaterally.  Will have her return for occipital nerve block. ? ?PLAN: ?-Prevention: Start Qulipta 60 mg daily ?-Rescue: Excedrin, baclofen ?-Return for occipital nerve block ?-next steps: Consider Cymbalta, verapamil, gabapentin for prevention. Consider diclofenac or ubrelvy for rescue ? ?Follow-up: 4 months ? ?I spent a total of 27 minutes on the date of the service. Headache education was done. Discussed treatment options including preventive and acute medications. Discussed medication side effects, adverse reactions and drug interactions. Written educational materials and patient instructions outlining all of the above were given. ? ?Genia Harold, MD ?10/05/21 ?3:24 PM ? ?

## 2021-10-06 ENCOUNTER — Telehealth: Payer: Self-pay

## 2021-10-06 NOTE — Telephone Encounter (Signed)
PA for qulipta has been approved. ? ? (Key: [id number]) ? ?This request has been approved. ? ?Please note any additional information provided by [employer] at the bottom of your screen. The authorization is effective for a maximum of 6 fill(s) from 10/06/2021 to 04/07/2022, as ?long as you are enrolled as a member of your current health  ?

## 2021-10-07 ENCOUNTER — Encounter: Payer: Self-pay | Admitting: Medical

## 2021-10-07 ENCOUNTER — Ambulatory Visit: Payer: 59 | Admitting: Medical

## 2021-10-07 VITALS — BP 130/82 | HR 90 | Ht 66.0 in | Wt 214.0 lb

## 2021-10-07 DIAGNOSIS — I10 Essential (primary) hypertension: Secondary | ICD-10-CM | POA: Diagnosis not present

## 2021-10-07 DIAGNOSIS — Z794 Long term (current) use of insulin: Secondary | ICD-10-CM | POA: Diagnosis not present

## 2021-10-07 DIAGNOSIS — R002 Palpitations: Secondary | ICD-10-CM | POA: Diagnosis not present

## 2021-10-07 DIAGNOSIS — E118 Type 2 diabetes mellitus with unspecified complications: Secondary | ICD-10-CM

## 2021-10-07 NOTE — Patient Instructions (Signed)
Medication Instructions:   Your physician recommends that you continue on your current medications as directed. Please refer to the Current Medication list given to you today.   *If you need a refill on your cardiac medications before your next appointment, please call your pharmacy*   Lab Work: None ordered  If you have labs (blood work) drawn today and your tests are completely normal, you will receive your results only by: MyChart Message (if you have MyChart) OR A paper copy in the mail If you have any lab test that is abnormal or we need to change your treatment, we will call you to review the results.   Testing/Procedures: None ordered   Follow-Up: At [facility], you and your health needs are our priority.  As part of our continuing mission to provide you with exceptional heart care, we have created designated Provider Care Teams.  These Care Teams include your primary Cardiologist (physician) and Advanced Practice Providers (APPs -  Physician Assistants and Nurse Practitioners) who all work together to provide you with the care you need, when you need it.  We recommend signing up for the patient portal called "MyChart".  Sign up information is provided on this After Visit Summary.  MyChart is used to connect with patients for Virtual Visits (Telemedicine).  Patients are able to view lab/test results, encounter notes, upcoming appointments, etc.  Non-urgent messages can be sent to your provider as well.   To learn more about what you can do with MyChart, go to [email or web address].    Your next appointment:    Your physician wants you to follow-up in: 1 year.   You will receive a reminder letter in the mail two months in advance. If you don't receive a letter, please call our office to schedule the follow-up appointment.   The format for your next appointment:   In Person  Provider:   You may see Christopher End, MD or one of the following Advanced Practice Providers  on your designated Care Team:   Christopher Berge, NP Ryan Dunn, PA-C Cadence Furth, PA-C   Other Instructions N/A  Important Information About Sugar       

## 2021-10-07 NOTE — Progress Notes (Signed)
?Cardiology Office Note:   ? ?Date:  10/08/2021  ? ?ID:  [name], DOB 04/23/1971, MRN [id number] ? ?PCP:  Crecencio Mc, MD  ?[facility] Cardiologist:  Nelva Bush, MD  ?[facility] Electrophysiologist:  None  ? ?Referring MD: Crecencio Mc, MD  ? ?Chief Complaint: 12 month follow-up ? ?History of Present Illness:   ? ?[name] is a 51 y.o. female with a hx of hypertension, DM2, [id number] complicated by fatigue and uveitis, asthma, anxiety, multiple allergies presents today for follow up of hypertension and palpitations.   ? ?She was seen January 2021 noting fatigue and shortness of breath.  Subsequent echo with normal LVEF and grade 1 diastolic dysfunction with mild LA enlargement.  She was seen in follow-up April 2021 recuperating from COVID-19 infection still with ureitis.  She was doing overall well from cardiac perspective.  Her palpitations were well controlled on diltiazem and metoprolol. ?  ?Last seen 08/12/20 and was doing well. Recommended she check BP now that she was off diltiazem.  ? ?Today, the patient reports she has been having palpitations due to labile BG. Plan for steroid injection in the left shoulder next week. Palpitations are brief, intermittent. No chest pain, LLE, orthopnea, pnd.no lightheadedness or dizziness. This has happened in the past. She works 3 jobs,  but is off this week to help her mother. BP is reasonable.  ? ?Past Medical History:  ?Diagnosis Date  ? Allergy   ? Anemia   ? Anxiety   ? claustrophobic  ? Asthma   ? Back pain   ? Biceps tendonosis of right shoulder   ? Cataract   ? Mixed OU  ? COVID-19   ? covid PNA hospitalized 2020, 06/06/21  ? Diabetes mellitus 2011  ? did not start metforfin, losing weight  ? Dyspnea   ? Fatty liver   ? Food allergy   ? Frozen shoulder   ? left, had steroid injection 10/05/21  ? Headache disorder   ? History of concussion   ? HLD (hyperlipidemia)   ? Hypertension   ? Hypertensive retinopathy   ? OU  ? IBS  (irritable bowel syndrome)   ? Infertility, female   ? Joint pain   ? Lactose intolerance   ? Leg edema   ? Migraines   ? Palpitation   ? Post-menopausal   ? Seborrheic dermatitis   ? Shoulder impingement syndrome, right   ? Uveitis   ? [place] eye est in 2020 seen specialist in [place] [facility] last seen 11 or 05/2020  ? Vitamin D deficiency   ? ? ?Past Surgical History:  ?Procedure Laterality Date  ? ABDOMINAL HYSTERECTOMY  2006  ? heavy menses, endometriosis, l oophrectomy  ? BACK SURGERY    ? BREAST BIOPSY Right 2018  ? benign  ? BREAST EXCISIONAL BIOPSY Right 2003  ? benign  ? BREAST EXCISIONAL BIOPSY Right 1999  ? benign  ? BREAST SURGERY    ? right breast x 2 , benign  ? COLONOSCOPY  10/06/2020  ? HERNIA REPAIR  2003  ? left inguinal   ? LEFT OOPHORECTOMY    ? SHOULDER ARTHROSCOPY WITH SUBACROMIAL DECOMPRESSION AND OPEN ROTATOR C Right 07/14/2016  ? Procedure: RIGHT SHOULDER ARTHROSCOPY WITH SUBACROMIAL DECOMPRESSION, DISTAL CLAVICLE RESECTION AND MINI OPEN ROTATOR CUFF REPAIR, OPEN BICEP TENDODESIS;  Surgeon: Garald Balding, MD;  Location: [facility];  Service: Orthopedics;  Laterality: Right;  ? SHOULDER CLOSED REDUCTION Right 09/08/2016  ?  Procedure: RIGHT CLOSED MANIPULATION SHOULDER;  Surgeon: Garald Balding, MD;  Location: [facility];  Service: Orthopedics;  Laterality: Right;  ? TUBAL LIGATION    ? UPPER GASTROINTESTINAL ENDOSCOPY  10/06/2020  ? ? ?Current Medications: ?Current Meds  ?Medication Sig  ? albuterol (VENTOLIN HFA) 108 (90 Base) MCG/ACT inhaler Inhale 2 puffs into the lungs every 6 (six) hours as needed for wheezing or shortness of breath.  ? ALPRAZolam (XANAX) 0.5 MG tablet Take 1 tablet (0.5 mg total) by mouth 3 (three) times daily as needed (Headache).  ? aspirin EC 81 MG tablet Take 81 mg by mouth daily.  ? Atogepant (QULIPTA) 60 MG TABS Take 60 mg by mouth daily.  ? atorvastatin (LIPITOR) 20 MG tablet Take 1 tablet (20 mg total) by mouth daily.  ?  baclofen (LIORESAL) 10 MG tablet baclofen 10 mg tablet  ? Blood Glucose Monitoring Suppl (FREESTYLE LITE) DEVI 1 each by Does not apply route 2 (two) times daily. E11.9  ? Blood Pressure Monitoring (OMRON 3 SERIES BP MONITOR) DEVI Use as directed  ? butalbital-acetaminophen-caffeine (FIORICET) 50-325-40 MG tablet Take 1 tablet by mouth every 6 (six) hours as needed for headache. Do not refill in less than 30 day  ? Continuous Blood Gluc Receiver (DEXCOM G6 RECEIVER) DEVI Use as directed.  ? Continuous Blood Gluc Sensor (DEXCOM G6 SENSOR) MISC Use as directed. Change every 10 days.  ? Continuous Blood Gluc Transmit (DEXCOM G6 TRANSMITTER) MISC Use as directed every 90 days.  ? diazepam (VALIUM) 2 MG tablet Take 1 tablet (2 mg total) by mouth every 6 (six) hours as needed (Headache).  ? dicyclomine (BENTYL) 20 MG tablet Take 1 tablet (20 mg total) by mouth every 6 (six) hours.  ? EPINEPHrine 0.3 mg/0.3 mL IJ SOAJ injection Inject 0.3 mg into the muscle as needed for anaphylaxis.  ? ergocalciferol (DRISDOL) 1.25 MG (50000 UT) capsule Take 1 capsule (50,000 Units total) by mouth once a week.  ? Fremanezumab-vfrm (AJOVY) 225 MG/1.5ML SOSY Inject 1.5 mLs into the skin every 30 (thirty) days.  ? glucose blood (FREESTYLE LITE) test strip 1 each by Other route 2 (two) times daily. E11.9  ? insulin lispro (HUMALOG) 100 UNIT/ML KwikPen Inject 22-24 Units into the skin 2 (two) times daily with a meal.  ? Insulin Pen Needle (UNIFINE PENTIPS) 32G X 4 MM MISC Use as directed two times a day  ? Ipratropium-Albuterol (COMBIVENT) 20-100 MCG/ACT AERS respimat Inhale 1 puff into the lungs every 6 (six) hours.  ? Lancets (FREESTYLE) lancets 1 each by Other route 2 (two) times daily. E11.9  ? losartan-hydrochlorothiazide (HYZAAR) 50-12.5 MG tablet Take 1 tablet by mouth daily.  ? meloxicam (MOBIC) 15 MG tablet Take 1 tablet (15 mg total) by mouth daily.  ? metoprolol succinate (TOPROL-XL) 25 MG 24 hr tablet Take 0.5 tablets (12.5 mg  total) by mouth daily.  ? ondansetron (ZOFRAN ODT) 4 MG disintegrating tablet Take 1 tablet (4 mg total) by mouth every 8 (eight) hours as needed for nausea or vomiting.  ? ondansetron (ZOFRAN) 8 MG tablet Take 1 tablet (8 mg total) by mouth every 8 (eight) hours as needed for nausea or vomiting.  ? oxyCODONE-acetaminophen (PERCOCET) 7.5-325 MG tablet Take 1 tablet by mouth every 4 (four) hours as needed for severe pain.  ? pantoprazole (PROTONIX) 40 MG tablet Take 1 tablet (40 mg total) by mouth daily. Take only with water and take 30 minutes before eating.  ? Semaglutide, 2 MG/DOSE, (  OZEMPIC, 2 MG/DOSE,) 8 MG/3ML SOPN Inject 2 mg into the skin once a week.  ? traZODone (DESYREL) 50 MG tablet Take 0.5-1 tablets (25-50 mg total) by mouth at bedtime as needed for sleep.  ?  ? ?Allergies:   Bamlanivimab, Botox [onabotulinumtoxina], Clindamycin/lincomycin, Kiwi extract, Maxalt [rizatriptan benzoate], Nitrous oxide, Rizatriptan benzoate, Triptans, Vyepti [eptinezumab-jjmr], Aspirin, Contrast media [iodinated contrast media], Erythromycin, Flagyl [metronidazole], Haemophilus influenzae vaccines, Latex, Mango flavor, Vicodin [hydrocodone-acetaminophen], Influenza virus vaccine, Penicillins, and Tetracyclines & related  ? ?Social History  ? ?Socioeconomic History  ? Marital status: Divorced  ?  Spouse name: Not on file  ? Number of children: 2  ? Years of education: 90  ? Highest education level: Master's degree (e.g., MA, MS, MEng, MEd, MSW, MBA)  ?Occupational History  ? 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 Smoking status: Never  ? Smokeless tobacco: Never  ?Vaping Use  ? Vaping Use: Never used  ?Substance and Sexual Activity  ? Alcohol use: No  ? Drug use: No  ? Sexual activity: Not Currently  ?  Partners: Male  ?  Birth control/protection: Surgical  ?Other Topics Concern  ? Not on file  ?Social History Narrative  ? Lives at home with son and daughter.  ? Right-handed.  ? 1-3 cups  caffeine weekly.  ? ?Social Determinants of Health  ? ?Financial Resource Strain: Not on file  ?Food Insecurity: Not on file  ?Transportation Needs: Not on file  ?Physical Activity: Not on file  ?Stress: Not on file  ?Social Co

## 2021-10-12 ENCOUNTER — Other Ambulatory Visit (HOSPITAL_COMMUNITY): Payer: Self-pay

## 2021-10-14 ENCOUNTER — Ambulatory Visit (HOSPITAL_BASED_OUTPATIENT_CLINIC_OR_DEPARTMENT_OTHER): Payer: 59 | Admitting: Orthopaedic Surgery

## 2021-10-14 ENCOUNTER — Other Ambulatory Visit (HOSPITAL_COMMUNITY): Payer: Self-pay

## 2021-10-14 DIAGNOSIS — M7502 Adhesive capsulitis of left shoulder: Secondary | ICD-10-CM

## 2021-10-14 DIAGNOSIS — S46011A Strain of muscle(s) and tendon(s) of the rotator cuff of right shoulder, initial encounter: Secondary | ICD-10-CM

## 2021-10-14 NOTE — Progress Notes (Signed)
? ?                            ? ? ?Chief Complaint: Left shoulder pain ?  ? ? ?History of Present Illness:  ? ?10/14/2021: Presents today for follow-up of her left shoulder as well as right shoulder.  She states the left shoulder feels dramatically better after injection for adhesive capsulitis.  She has returned to full range of motion which is painless.  She did have a flareup of her diabetes and blood sugar levels after the injection but this is subsequently been under control.  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  Since that time she has limited overhead motion about the right shoulder.  She is status post rotator cuff repair done by Dr. Durward Fortes on the side in 2018. ? ?[name] is a 51 y.o. female right-hand-dominant presents with left shoulder pain that has been ongoing for 6 months now.  She has no known injury.  She does have a history of diabetes.  She states that she had previously had right shoulder rotator cuff repair and has subsequently used her left arm in order to compensate.  She is scheduled for trip to [place] the upcoming day to go see her daughter's cheer competition.  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  She is status post right shoulder rotator cuff repair done with Dr. Durward Fortes in 2018 ? ? ? ?Surgical History:   ?Right shoulder rotator cuff repair done in 2018 ? ?PMH/PSH/Family History/Social History/Meds/Allergies:   ? ?Past Medical History:  ?Diagnosis Date  ? Allergy   ? Anemia   ? Anxiety   ? claustrophobic  ? Asthma   ? Back pain   ? Biceps tendonosis of right shoulder   ? Cataract   ? Mixed OU  ? COVID-19   ? covid PNA hospitalized 2020, 06/06/21  ? Diabetes mellitus 2011  ? did not start metforfin, losing weight  ? Dyspnea   ? Fatty liver   ? Food allergy   ? Frozen shoulder   ? left, had steroid injection 10/05/21  ? Headache disorder   ?  History of concussion   ? HLD (hyperlipidemia)   ? Hypertension   ? Hypertensive retinopathy   ? OU  ? IBS (irritable bowel syndrome)   ? Infertility, female   ? Joint pain   ? Lactose intolerance   ? Leg edema   ? Migraines   ? Palpitation   ? Post-menopausal   ? Seborrheic dermatitis   ? Shoulder impingement syndrome, right   ? Uveitis   ? [facility] est in 2020 seen specialist in [place] [facility] last seen 11 or 05/2020  ? Vitamin D deficiency   ? ?Past Surgical History:  ?Procedure Laterality Date  ? ABDOMINAL HYSTERECTOMY  2006  ? heavy menses, endometriosis, l oophrectomy  ? BACK SURGERY    ? BREAST BIOPSY Right 2018  ? benign  ? BREAST EXCISIONAL BIOPSY Right 2003  ? benign  ? BREAST EXCISIONAL BIOPSY Right 1999  ? benign  ? BREAST SURGERY    ? right breast x 2 , benign  ? COLONOSCOPY  10/06/2020  ? HERNIA REPAIR  2003  ? left inguinal   ? LEFT OOPHORECTOMY    ? SHOULDER  ARTHROSCOPY WITH SUBACROMIAL DECOMPRESSION AND OPEN ROTATOR C Right 07/14/2016  ? Procedure: RIGHT SHOULDER ARTHROSCOPY WITH SUBACROMIAL DECOMPRESSION, DISTAL CLAVICLE RESECTION AND MINI OPEN ROTATOR CUFF REPAIR, OPEN BICEP TENDODESIS;  Surgeon: Garald Balding, MD;  Location: [facility];  Service: Orthopedics;  Laterality: Right;  ? SHOULDER CLOSED REDUCTION Right 09/08/2016  ? Procedure: RIGHT CLOSED MANIPULATION SHOULDER;  Surgeon: Garald Balding, MD;  Location: [facility];  Service: Orthopedics;  Laterality: Right;  ? TUBAL LIGATION    ? UPPER GASTROINTESTINAL ENDOSCOPY  10/06/2020  ? ?Social History  ? ?Socioeconomic History  ? Marital status: Divorced  ?  Spouse name: Not on file  ? Number of children: 2  ? Years of education: 19  ? Highest education level: Master's degree (e.g., MA, MS, MEng, MEd, MSW, MBA)  ?Occupational History  ? 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 Smoking status: Never  ? Smokeless tobacco: Never  ?Vaping Use  ? Vaping Use: Never used  ?Substance and  Sexual Activity  ? Alcohol use: No  ? Drug use: No  ? Sexual activity: Not Currently  ?  Partners: Male  ?  Birth control/protection: Surgical  ?Other Topics Concern  ? Not on file  ?Social History Narrative  ? Lives at home with son and daughter.  ? Right-handed.  ? 1-3 cups caffeine weekly.  ? ?Social Determinants of Health  ? ?Financial Resource Strain: Not on file  ?Food Insecurity: Not on file  ?Transportation Needs: Not on file  ?Physical Activity: Not on file  ?Stress: Not on file  ?Social Connections: Not on file  ? ?Family History  ?Problem Relation Age of Onset  ? Diabetes Mother   ? Heart disease Mother 40  ? Hypertension Mother   ? Hyperlipidemia Mother   ? Heart failure Mother   ? Stroke Mother   ? Thyroid disease Mother   ? Depression Mother   ? Sleep apnea Mother   ? Obesity Mother   ? Colon polyps Mother   ? Cancer Father 65  ?     Lung Cancer  ? Heart disease Father   ? Mental illness Sister   ?     bipolar, substance abuse,  clean 2 yrs  ? Hypertension Sister   ? Cancer Paternal Grandmother 47  ?     breast cancer  ? Breast cancer Paternal Grandmother   ? Diabetes Maternal Grandmother   ? Hypertension Maternal Grandmother   ? Diabetes Son   ? Colon cancer Neg Hx   ? Esophageal cancer Neg Hx   ? Rectal cancer Neg Hx   ? Stomach cancer Neg Hx   ? ?Allergies  ?Allergen Reactions  ? Bamlanivimab Anaphylaxis, Itching, Palpitations, Rash, Shortness Of Breath and Swelling  ? Botox [Onabotulinumtoxina] Shortness Of Breath  ?  syncope  ? Clindamycin/Lincomycin Other (See Comments)  ? Kiwi Extract Shortness Of Breath  ? Maxalt [Rizatriptan Benzoate] Anaphylaxis  ?  Chest pain  ? Nitrous Oxide Shortness Of Breath  ?  syncope  ? Rizatriptan Benzoate Anaphylaxis  ?  Chest pain  ? Triptans Shortness Of Breath  ? Vyepti [Eptinezumab-Jjmr] Shortness Of Breath, Palpitations and Other (See Comments)  ?  Throat soreness, tachycardia  ? Aspirin Nausea And Vomiting  ?  Mouth blisters  ? Contrast Media [Iodinated  Contrast Media]   ? Erythromycin   ? Flagyl [Metronidazole]   ? Haemophilus Influenzae Vaccines   ?  Latex   ?  Sometimes causes rash  ? Mango Flavor   ? Vicodin [Hydrocodone-Acetaminophen]   ?  hallucinations  ? Influenza Virus Vaccine Palpitations  ? Penicillins Nausea And Vomiting, Rash and Other (See Comments)  ?  Did it involve swelling of the face/tongue/throat, SOB, or low BP? No ?Did it involve sudden or severe rash/hives, skin peeling, or any reaction on the inside of your mouth or nose? No ?Did you need to seek medical attention at a hospital or doctor's office? Yes ?When did it last happen?     patient was 51 years old  ?If all above answers are ?NO?, may proceed with cephalosporin use.  ? Tetracyclines & Related Nausea And Vomiting and Rash  ? ?Current Outpatient Medications  ?Medication Sig Dispense Refill  ? albuterol (VENTOLIN HFA) 108 (90 Base) MCG/ACT inhaler Inhale 2 puffs into the lungs every 6 (six) hours as needed for wheezing or shortness of breath. 18 g 0  ? ALPRAZolam (XANAX) 0.5 MG tablet Take 1 tablet (0.5 mg total) by mouth 3 (three) times daily as needed (Headache).    ? aspirin EC 81 MG tablet Take 81 mg by mouth daily.    ? Atogepant (QULIPTA) 60 MG TABS Take 60 mg by mouth daily. 30 tablet 3  ? atorvastatin (LIPITOR) 20 MG tablet Take 1 tablet (20 mg total) by mouth daily. 90 tablet 3  ? baclofen (LIORESAL) 10 MG tablet baclofen 10 mg tablet    ? Blood Glucose Monitoring Suppl (FREESTYLE LITE) DEVI 1 each by Does not apply route 2 (two) times daily. E11.9 1 each 0  ? Blood Pressure Monitoring (OMRON 3 SERIES BP MONITOR) DEVI Use as directed 1 each 0  ? butalbital-acetaminophen-caffeine (FIORICET) 50-325-40 MG tablet Take 1 tablet by mouth every 6 (six) hours as needed for headache. Do not refill in less than 30 day 30 tablet 1  ? Continuous Blood Gluc Receiver (DEXCOM G6 RECEIVER) DEVI Use as directed. 1 each 1  ? Continuous Blood Gluc Sensor (DEXCOM G6 SENSOR) MISC Use as directed.  Change every 10 days. 9 each 3  ? Continuous Blood Gluc Transmit (DEXCOM G6 TRANSMITTER) MISC Use as directed every 90 days. 1 each 1  ? diazepam (VALIUM) 2 MG tablet Take 1 tablet (2 mg total) by mouth every 6

## 2021-10-15 ENCOUNTER — Other Ambulatory Visit (HOSPITAL_COMMUNITY): Payer: Self-pay

## 2021-10-18 ENCOUNTER — Ambulatory Visit: Payer: 59 | Attending: Psychiatry

## 2021-10-18 DIAGNOSIS — M6281 Muscle weakness (generalized): Secondary | ICD-10-CM | POA: Insufficient documentation

## 2021-10-18 DIAGNOSIS — M542 Cervicalgia: Secondary | ICD-10-CM | POA: Insufficient documentation

## 2021-10-18 NOTE — Therapy (Deleted)
[facility] ?[place], [place], [address] ?Phone: [phone]   Fax:  [phone] ? ?Physical Therapy Evaluation ? ?Patient Details  ?Name: [name] ?MRN: [id number] ?Date of Birth: [birth year] ?No data recorded ? ?Encounter Date: 10/18/2021 ? ? ? ?Past Medical History:  ?Diagnosis Date  ? Allergy   ? Anemia   ? Anxiety   ? claustrophobic  ? Asthma   ? Back pain   ? Biceps tendonosis of right shoulder   ? Cataract   ? Mixed OU  ? COVID-19   ? covid PNA hospitalized 2020, 06/06/21  ? Diabetes mellitus 2011  ? did not start metforfin, losing weight  ? Dyspnea   ? Fatty liver   ? Food allergy   ? Frozen shoulder   ? left, had steroid injection 10/05/21  ? Headache disorder   ? History of concussion   ? HLD (hyperlipidemia)   ? Hypertension   ? Hypertensive retinopathy   ? OU  ? IBS (irritable bowel syndrome)   ? Infertility, female   ? Joint pain   ? Lactose intolerance   ? Leg edema   ? Migraines   ? Palpitation   ? Post-menopausal   ? Seborrheic dermatitis   ? Shoulder impingement syndrome, right   ? Uveitis   ? [facility] est in 2020 seen specialist in [place] [facility] last seen 11 or 05/2020  ? Vitamin D deficiency   ? ? ?Past Surgical History:  ?Procedure Laterality Date  ? ABDOMINAL HYSTERECTOMY  2006  ? heavy menses, endometriosis, l oophrectomy  ? BACK SURGERY    ? BREAST BIOPSY Right 2018  ? benign  ? BREAST EXCISIONAL BIOPSY Right 2003  ? benign  ? BREAST EXCISIONAL BIOPSY Right 1999  ? benign  ? BREAST SURGERY    ? right breast x 2 , benign  ? COLONOSCOPY  10/06/2020  ? HERNIA REPAIR  2003  ? left inguinal   ? LEFT OOPHORECTOMY    ? SHOULDER ARTHROSCOPY WITH SUBACROMIAL DECOMPRESSION AND OPEN ROTATOR C Right 07/14/2016  ? Procedure: RIGHT SHOULDER ARTHROSCOPY WITH SUBACROMIAL DECOMPRESSION, DISTAL CLAVICLE RESECTION AND MINI OPEN ROTATOR CUFF REPAIR, OPEN BICEP TENDODESIS;  Surgeon: Garald Balding, MD;  Location: [facility];  Service: Orthopedics;  Laterality: Right;  ? SHOULDER CLOSED REDUCTION Right 09/08/2016  ? Procedure: RIGHT CLOSED MANIPULATION SHOULDER;  Surgeon: Garald Balding, MD;  Location: [facility];  Service: Orthopedics;  Laterality: Right;  ? TUBAL LIGATION    ? UPPER GASTROINTESTINAL ENDOSCOPY  10/06/2020  ? ? ?There were no vitals filed for this visit. ? ? ?PAIN: ?Current: 4/10 ?Worst: 8/10 ?Best: pt reports pain does not normally get below a 4/10. ? ? ?Sensation:  ?Numbness in pinky and ring-finger of RUE. ? ?Restrictions no over-head lifting reaching, or extension, 10-15 lbs (reports 2020-2021, pt reports these are permanent restrictions for RUE).  ?Pt reports facial numbness that comes on with migraine, can feel things are crawling on my skin ? ?Pt reports RUE feels "hyper-sensitive" to light touch compared to LLE  ? ?BLE are intact to light touch, no differences between sides  ? ?POSTURE: slight elevation of B shoulders ? ? ?PROM/AROM: ? ?RUE limited to 90 in part restrictions, feels tight ?LUE WFL flexion/abduction  ?Shoulder ER R side impaired, L side WNL  ? ?AROM: ?Pain felt on R with extension and lateral flexion ? ?Cervical:  ?Ext. 15 deg.  ?  Flex. 23 deg.  ?Lateral flex: 20 deg. L., 40 deg R ?Rotation: 20-23 deg Bilat: ? ?STRENGTH:  Graded on a 0-5 scale ?Grossly 4/5 BUEs.  ? ?Cervical strength generally 4+/5, painful with lateral flexion on R  ? ?Elsatogel donned ? ?HEP: ?N/t in hand with cervical rotation R side ? ?Access Code: [email or web address] ?URL: [email or web address] ?Date: 10/18/2021 ?Prepared by: Ricard Dillon ? ?Exercises ?- Seated Cervical Sidebending Stretch  - 1 x daily - 7 x weekly - 2 sets - 2 reps - 30 hold ?- Seated Cervical Rotation Stretch  - 1 x daily - 7 x weekly - 2 sets - 2 reps - 30 hold ?- Seated Scapular Retraction  - 1 x daily - 7 x weekly - 2 sets - 15 reps ? ? ?SPECIAL TESTS: ? ?TTP around C7-T1 and entire upper cervical spine, occipital triangle and r  side cervical and shoulder musculature. TTP B rhomboids, described as an increased sensitivity to touch ? ?BALANCE: ? ? ?GAIT: ? ?FOTO: ? ?OUTCOME MEASURES: ?TEST Outcome Interpretation  ?5 times sit<>stand sec >60 yo, >15 sec indicates increased risk for falls  ?10 meter walk test                 m/s <1.0 m/s indicates increased risk for falls; limited community ambulator  ?Timed up and Go                 sec <14 sec indicates increased risk for falls  ?6 minute walk test                Feet 1000 feet is community ambulator  ?Berg Balance Assessment  <36/56 (100% risk for falls), 37-45 (80% risk for falls); 46-51 (>50% risk for falls); 52-55 (lower risk <25% of falls)  ?9 Hole Peg Test L:                R:    ? ?FOTO: 44 (goal 78) ? ? ? ?Objective measurements completed on examination: See above findings.  ? ?Patient will benefit from skilled therapeutic intervention in order to improve the following deficits and impairments:    ? ?Visit Diagnosis: ?No diagnosis found. ? ? ? ? ?Problem List ?Patient Active Problem List  ? Diagnosis Date Noted  ? 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 Chronic left shoulder pain 09/21/2021  ? Sinus tachycardia 06/23/2021  ? Fatty infiltration of liver 07/26/2020  ? Diarrhea, functional 06/22/2020  ? Uveitis, intermediate, bilateral 09/07/2019  ? Generalized edema 06/13/2019  ? Abnormal electrocardiogram (ECG) (EKG) 06/07/2019  ? Hospital discharge follow-up 06/07/2019  ? Postviral fatigue syndrome 05/30/2019  ? Multinodular goiter 12/05/2017  ? Multiple thyroid nodules 12/05/2017  ? Microalbuminuria due to type 2 diabetes mellitus ([facility]) 11/26/2017  ? Encounter for preventive health examination 11/26/2017  ? Menopause syndrome 06/10/2017  ? Adverse reaction to vaccine, sequela 03/25/2017  ? Palpitations 02/27/2017  ? PAC (premature atrial contraction) 02/27/2017  ? PVC's (premature ventricular contractions) 02/27/2017  ? PSVT (paroxysmal  supraventricular tachycardia) ([facility]) 11/26/2016  ? Nontraumatic incomplete tear of right rotator cuff 07/14/2016  ? AC (acromioclavicular) arthritis 07/14/2016  ? Impingement syndrome of right shoulder 07/14/2016  ? Fibrocystic breast changes, right 05/21/2016  ? Menopausal symptoms 10/30/2015  ? Insomnia 10/16/2015  ? Snoring 07/26/2015  ? Chronic migraine w/o aura w/o status migrainosus, not intractable 02/24/2015  ? History of concussion 07/21/2014  ? Hyperlipidemia associated with type 2 diabetes mellitus ([facility]) 06/13/2014  ?  Vitamin D deficiency 05/07/2013  ? Chronic migraine 11/20/2012  ? S/P Total Abdominal Hysterectomy and Left Salpingo-oophorectomy 10/20/2011  ? Headache around the eyes   ? Uncontrolled type 2 diabetes mellitus 07/20/2011  ? Obesity (BMI 30-39.9) 07/20/2011  ? Essential hypertension 07/20/2011  ? ? ?Zollie Pee, PT ?10/18/2021, 4:03 PM ? ?[facility] ?[place], [place], [address] ?Phone: [phone]   Fax:  [phone] ? ?Name: JENTRI AYE ?MRN: [id number] ?Date of Birth: [birth year] ? ? ?

## 2021-10-19 ENCOUNTER — Encounter: Payer: Self-pay | Admitting: Internal Medicine

## 2021-10-19 ENCOUNTER — Ambulatory Visit: Payer: 59 | Admitting: Internal Medicine

## 2021-10-19 ENCOUNTER — Other Ambulatory Visit: Payer: Self-pay

## 2021-10-19 ENCOUNTER — Ambulatory Visit: Payer: 59

## 2021-10-19 VITALS — BP 130/92 | HR 91 | Ht 66.0 in | Wt 215.4 lb

## 2021-10-19 DIAGNOSIS — E1165 Type 2 diabetes mellitus with hyperglycemia: Secondary | ICD-10-CM | POA: Diagnosis not present

## 2021-10-19 DIAGNOSIS — E1142 Type 2 diabetes mellitus with diabetic polyneuropathy: Secondary | ICD-10-CM

## 2021-10-19 LAB — POCT GLYCOSYLATED HEMOGLOBIN (HGB A1C): Hemoglobin A1C: 8.1 % — AB (ref 4.0–5.6)

## 2021-10-19 MED ORDER — TRESIBA FLEXTOUCH 200 UNIT/ML ~~LOC~~ SOPN
20.0000 [IU] | PEN_INJECTOR | Freq: Every day | SUBCUTANEOUS | 3 refills | Status: DC
  Filled 2021-10-19: qty 9, 90d supply, fill #0
  Filled 2022-02-02: qty 9, 45d supply, fill #1
  Filled 2022-03-22: qty 9, 45d supply, fill #2

## 2021-10-19 MED ORDER — UNIFINE PENTIPS 32G X 4 MM MISC
4 refills | Status: DC
  Filled 2021-10-19: qty 200, 90d supply, fill #0

## 2021-10-19 MED ORDER — METFORMIN HCL ER 500 MG PO TB24
1000.0000 mg | ORAL_TABLET | Freq: Every day | ORAL | 3 refills | Status: DC
Start: 2021-10-19 — End: 2022-01-28
  Filled 2021-10-19: qty 180, 90d supply, fill #0

## 2021-10-19 NOTE — Therapy (Signed)
[facility] ?[facility] ?[place], [place], [address] ?Phone: [phone]   Fax:  [phone] ? ?Physical Therapy Evaluation ? ?Patient Details  ?Name: [name] ?MRN: [id number] ?Date of Birth: 1971-02-11 ?Referring Provider (PT): Genia Harold, MD ? ? ?Encounter Date: 10/18/2021 ? ? PT End of Session - 10/19/21 0831   ? ? Visit Number 1   ? Number of Visits 25   ? Date for PT Re-Evaluation 01/10/22   ? Authorization Time Period 10/18/21-01/10/22   ? PT Start Time 1603   ? PT Stop Time 1700   ? PT Time Calculation (min) 57 min   ? Activity Tolerance Patient tolerated treatment well;Patient limited by pain   ? Behavior During Therapy [facility] for tasks assessed/performed   ? ?  ?  ? ?  ? ? ?Past Medical History:  ?Diagnosis Date  ? Allergy   ? Anemia   ? Anxiety   ? claustrophobic  ? Asthma   ? Back pain   ? Biceps tendonosis of right shoulder   ? Cataract   ? Mixed OU  ? COVID-19   ? covid PNA hospitalized 2020, 06/06/21  ? Diabetes mellitus 2011  ? did not start metforfin, losing weight  ? Dyspnea   ? Fatty liver   ? Food allergy   ? Frozen shoulder   ? left, had steroid injection 10/05/21  ? Headache disorder   ? History of concussion   ? HLD (hyperlipidemia)   ? Hypertension   ? Hypertensive retinopathy   ? OU  ? IBS (irritable bowel syndrome)   ? Infertility, female   ? Joint pain   ? Lactose intolerance   ? Leg edema   ? Migraines   ? Palpitation   ? Post-menopausal   ? Seborrheic dermatitis   ? Shoulder impingement syndrome, right   ? Uveitis   ? [facility] est in 2020 seen specialist in [place] [facility] last seen 11 or 05/2020  ? Vitamin D deficiency   ? ? ?Past Surgical History:  ?Procedure Laterality Date  ? ABDOMINAL HYSTERECTOMY  2006  ? heavy menses, endometriosis, l oophrectomy  ? BACK SURGERY    ? BREAST BIOPSY Right 2018  ? benign  ? BREAST EXCISIONAL BIOPSY Right 2003  ? benign  ? BREAST EXCISIONAL BIOPSY Right 1999  ? benign  ? BREAST SURGERY     ? right breast x 2 , benign  ? COLONOSCOPY  10/06/2020  ? HERNIA REPAIR  2003  ? left inguinal   ? LEFT OOPHORECTOMY    ? SHOULDER ARTHROSCOPY WITH SUBACROMIAL DECOMPRESSION AND OPEN ROTATOR C Right 07/14/2016  ? Procedure: RIGHT SHOULDER ARTHROSCOPY WITH SUBACROMIAL DECOMPRESSION, DISTAL CLAVICLE RESECTION AND MINI OPEN ROTATOR CUFF REPAIR, OPEN BICEP TENDODESIS;  Surgeon: Garald Balding, MD;  Location: [facility];  Service: Orthopedics;  Laterality: Right;  ? SHOULDER CLOSED REDUCTION Right 09/08/2016  ? Procedure: RIGHT CLOSED MANIPULATION SHOULDER;  Surgeon: Garald Balding, MD;  Location: [facility];  Service: Orthopedics;  Laterality: Right;  ? TUBAL LIGATION    ? UPPER GASTROINTESTINAL ENDOSCOPY  10/06/2020  ? ? ?There were no vitals filed for this visit. ? ? ? Subjective Assessment - 10/18/21 1604   ? ? Subjective Pt is a pleasant 51 y/o female presenting to PT due to chronic migraine headaches with cervical and bilateral shoulder pain. Pt has headaches every day. Pt reports onset of migraines at 9-10 years  old where she had to seek emergency care due to severity of migraines. Pt reports COVID19 illness in 2020 and that migraines have been different since, including n/t that can occur on one side of her face (pt being followed by neurologist). Pt reports she has tried various medications, muscle relaxers to control migraines and related pain but that nothing has worked well yet. Other symptoms with her migraines include motion sickness, nausea, spinning sensation with her eyes closed, light and noise sensitivity, heightened sensitivity to touch on her face and scalp. Pt grinds her teeth at night and does feel jaw pain, reports hx of TMJ pain, particularly with going to dentist where her jaw can feel ?locked.? Migraine headaches and pain are limiting pt?s ability to perform ADLs and job duties. Pt with current R shoulder pain (reports has ?possibly re-torn? R RTC), chronic R  restrictions since 2020-2021 of no lifting greater than 10-15 lbs, no reaching behind, no reaching above her head. Hx of R RTC repair 2018 per chart with dx of R shoulder adhesive capsulitis. Pt currently being treated for adhesive capsulitis of L shoulder.   ? Pertinent History Pt is a pleasant 51 y/o female presenting to PT due to chronic migraine headaches with cervical and bilateral shoulder pain. Pt has headaches every day. Pt reports onset of migraines at 75-82 years old where she had to seek emergency care due to severity of migraines. Pt reports COVID19 illness in 2020 and that migraines have been different since, including n/t that can occur on one side of her face (pt being followed by neurologist). Pt reports she has tried various medications, muscle relaxers to control migraines and related pain but that nothing has worked well yet. Other symptoms with her migraines include motion sickness, nausea, spinning sensation with her eyes closed, light and noise sensitivity, heightened sensitivity to touch on her face and scalp. Pt grinds her teeth at night and does feel jaw pain, reports hx of TMJ pain, particularly with going to dentist where her jaw can feel ?locked.? Migraine headaches and pain are limiting pt?s ability to perform ADLs and job duties. Pt with current R shoulder pain (reports has ?possibly re-torn? R RTC), chronic R restrictions since 2020-2021 of no lifting greater than 10-15 lbs, no reaching behind, no reaching above her head. Hx of R RTC repair 2018 per chart with dx of R shoulder adhesive capsulitis. Pt currently being treated for adhesive capsulitis of L shoulder..  Per chart other PMH significant for PMH: anxiety (claustrophobic), back pain, asthma, biceps tendonosis of R shoulder, DM, dyspnea, L frozen shouler (steroid injection 10/05/21), hx of concussion, headache disorder, HTN, HTN retinopathy, IBS, joint pain, leg edema, palpitation, R shoulder impingement syndrome, vitamin D  deficiency, uveitis, paroxysmal supraventricular tachycardia, shoulder arthroscopy with subacromial decompression and mini open RTC repair, open bicep tenodesis.   ? Limitations House hold activities;Other (comment)   Headaches frequent, completely limit activity with increased pain  ? Diagnostic tests DG L shoulder 09/29/21: "IMPRESSION:  No significant osteoarthritis."; upcoming MRI for R shoulder   ? Patient Stated Goals Decrease pain and stiffness   ? Currently in Pain? Yes   ? Pain Score 4    ? Pain Location Head   headaches  ? Pain Orientation Right   ? Pain Descriptors / Indicators Dull;Aching;Nagging   deep  ? Pain Type Chronic pain   ? Pain Onset More than a month ago   ? Pain Frequency Other (Comment)   headaches every day  ? ?  ?  ? ?  ? ? ? ? ?  [facility] PT Assessment - 10/18/21 1625   ? ?  ? Assessment  ? Medical Diagnosis cervicalgia   ? Referring Provider (PT) Genia Harold, MD   ? Onset Date/Surgical Date --   since childhood, worsened 2020  ? Prior Therapy Yes for her shoulder   ?  ? Precautions  ? Precautions Shoulder   ? Type of Shoulder Precautions For R shoulder, pt reports she has these restrictions forever: no overhead lifting/reaching, no extension, no lifting more than 10-15 lbs (since 2020)   ?  ? Restrictions  ? Weight Bearing Restrictions No   ?  ? Balance Screen  ? Has the patient fallen in the past 6 months No   ? ?  ?  ? ?  ? ?Evaluation and Examination ? ?PAIN: ?Current: 4/10 ?Worst: 8/10 ?Best: pt reports pain does not normally get below a 4/10. ? ? ?Sensation:  ?Numbness in pinky and ring-finger of RUE. ? ?Pt reports RUE feels "hyper-sensitive" to light touch compared to LUE , able to feel light touch bilat ? ?BLE are intact to light touch, no differences between sides  ? ? ?POSTURE: slight elevation of B shoulders ? ? ?PROM/AROM: ? ?UE: ?RUE limited to 90 deg flexion/abd in part due to restrictions, also with report of tightness ?LUE WFL flexion/abduction  ?Shoulder ER R side impaired,  L side WNL  ? ? ?Cervical AROM: ?Ext. 15 deg.  ?Flex. 23 deg.  ?Lateral flex: 20 deg. L., 40 deg R ?Rotation: 20-23 deg Bilat: ?Comments: painful with extension and lateral flexion ? ?STRENGTH:  Graded on a 0-5

## 2021-10-19 NOTE — Progress Notes (Signed)
Patient ID: [name], female   DOB: [birth year], 51 y.o.   MRN: [id number] ? ?HPI: [name] is a 51 y.o.-year-old female-year-old female, returning for follow-up for DM2, dx in 2013, with history of GDM in 1990 and 2004, insulin-dependent since 2018, uncontrolled, with complications (mild CKD, PN). ? ?She describes that she had a L shoulder steroid inj. >> sugars much higher for 7-10 days, now starting to improve significantly in the last week. ? ?Reviewed HbA1c: ?Lab Results  ?Component Value Date  ? HGBA1C 7.5 (A) 08/03/2021  ? HGBA1C 7.6 (A) 05/03/2021  ? HGBA1C 7.0 (A) 01/26/2021  ? HGBA1C 6.9 (A) 10/26/2020  ? HGBA1C 6.1 (A) 07/28/2020  ? HGBA1C 6.0 (A) 01/30/2020  ? HGBA1C 6.6 (A) 12/02/2019  ? HGBA1C 7.3 (A) 08/15/2019  ? HGBA1C 8.5 (H) 05/30/2019  ? HGBA1C 7.5 (A) 04/05/2019  ? ?Pt is on a regimen of: ?- Ozempic 2 mg weekly ?- Humalog 22 -24 (30) units 2x a day after meals (patient has mostly 2 meals a day) ?She tried Victoza >> dysphagia. ?She tried Jardiance >> yeast infections. ?She was taking Metformin 11/2020 after he had IBS symptoms. ? ?Pt checks her sugars more than 4 times a day with her Dexcom G6 CGM: ? ? ? ? ?Lowest sugar was 70s; she has hypoglycemia awareness at 70.  ?Highest sugar was 300s. ? ?-+ Stage IIIa CKD, last BUN/creatinine:  ?Lab Results  ?Component Value Date  ? BUN 14 09/21/2021  ? BUN 13 03/15/2021  ? CREATININE 1.12 09/21/2021  ? CREATININE 1.09 (H) 03/15/2021  ?She is not on ACE inhibitor/ARB. ? ?-+ HL; last set of lipids: ?Lab Results  ?Component Value Date  ? CHOL 141 09/21/2021  ? HDL 34.20 (L) 09/21/2021  ? Lynn 83 04/23/2018  ? LDLDIRECT 88.0 09/21/2021  ? TRIG 248.0 (H) 09/21/2021  ? CHOLHDL 4 09/21/2021  ?On Lipitor 20 mg daily. ? ?- last eye exam was in 08/2021. No DR reportedly.  ? ?- + numbness and tingling in her feet. Last foot exam: 09/2021. ? ?On ASA 81. ? ?Patient also has a history of HTN, fatty liver, IBS. ? ?ROS: ?+ see HPI ?No increased urination, + blurry  vision, no nausea, chest pain. ? ?Past Medical History:  ?Diagnosis Date  ? Allergy   ? Anemia   ? Anxiety   ? claustrophobic  ? Asthma   ? Back pain   ? Biceps tendonosis of right shoulder   ? Cataract   ? Mixed OU  ? COVID-19   ? covid PNA hospitalized 2020, 06/06/21  ? Diabetes mellitus 2011  ? did not start metforfin, losing weight  ? Dyspnea   ? Fatty liver   ? Food allergy   ? Frozen shoulder   ? left, had steroid injection 10/05/21  ? Headache disorder   ? History of concussion   ? HLD (hyperlipidemia)   ? Hypertension   ? Hypertensive retinopathy   ? OU  ? IBS (irritable bowel syndrome)   ? Infertility, female   ? Joint pain   ? Lactose intolerance   ? Leg edema   ? Migraines   ? Palpitation   ? Post-menopausal   ? Seborrheic dermatitis   ? Shoulder impingement syndrome, right   ? Uveitis   ? [place] [facility] est in 2020 seen specialist in [place] [facility] last seen 11 or 05/2020  ? Vitamin D deficiency   ? ?Past Surgical History:  ?Procedure Laterality Date  ? ABDOMINAL HYSTERECTOMY  2006  ? heavy menses, endometriosis, l oophrectomy  ? BACK SURGERY    ? BREAST BIOPSY Right 2018  ? benign  ? BREAST EXCISIONAL BIOPSY Right 2003  ? benign  ? BREAST EXCISIONAL BIOPSY Right 1999  ? benign  ? BREAST SURGERY    ? right breast x 2 , benign  ? COLONOSCOPY  10/06/2020  ? HERNIA REPAIR  2003  ? left inguinal   ? LEFT OOPHORECTOMY    ? SHOULDER ARTHROSCOPY WITH SUBACROMIAL DECOMPRESSION AND OPEN ROTATOR C Right 07/14/2016  ? Procedure: RIGHT SHOULDER ARTHROSCOPY WITH SUBACROMIAL DECOMPRESSION, DISTAL CLAVICLE RESECTION AND MINI OPEN ROTATOR CUFF REPAIR, OPEN BICEP TENDODESIS;  Surgeon: Garald Balding, MD;  Location: [facility];  Service: Orthopedics;  Laterality: Right;  ? SHOULDER CLOSED REDUCTION Right 09/08/2016  ? Procedure: RIGHT CLOSED MANIPULATION SHOULDER;  Surgeon: Garald Balding, MD;  Location: [facility];  Service: Orthopedics;  Laterality: Right;  ? TUBAL LIGATION    ? UPPER  GASTROINTESTINAL ENDOSCOPY  10/06/2020  ? ?Social History  ? ?Socioeconomic History  ? Marital status: Divorced  ?  Spouse name: Not on file  ? Number of children: 2  ? Years of education: 52  ? Highest education level: Master's degree (e.g., MA, MS, MEng, MEd, MSW, MBA)  ?Occupational History  ? 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 Smoking status: Never  ? Smokeless tobacco: Never  ?Vaping Use  ? Vaping Use: Never used  ?Substance and Sexual Activity  ? Alcohol use: No  ? Drug use: No  ? Sexual activity: Not Currently  ?  Partners: Male  ?  Birth control/protection: Surgical  ?Other Topics Concern  ? Not on file  ?Social History Narrative  ? Lives at home with son and daughter.  ? Right-handed.  ? 1-3 cups caffeine weekly.  ? ?Social Determinants of Health  ? ?Financial Resource Strain: Not on file  ?Food Insecurity: Not on file  ?Transportation Needs: Not on file  ?Physical Activity: Not on file  ?Stress: Not on file  ?Social Connections: Not on file  ?Intimate Partner Violence: Not on file  ? ?Current Outpatient Medications on File Prior to Visit  ?Medication Sig Dispense Refill  ? albuterol (VENTOLIN HFA) 108 (90 Base) MCG/ACT inhaler Inhale 2 puffs into the lungs every 6 (six) hours as needed for wheezing or shortness of breath. 18 g 0  ? ALPRAZolam (XANAX) 0.5 MG tablet Take 1 tablet (0.5 mg total) by mouth 3 (three) times daily as needed (Headache).    ? aspirin EC 81 MG tablet Take 81 mg by mouth daily.    ? Atogepant (QULIPTA) 60 MG TABS Take 60 mg by mouth daily. 30 tablet 3  ? atorvastatin (LIPITOR) 20 MG tablet Take 1 tablet (20 mg total) by mouth daily. 90 tablet 3  ? baclofen (LIORESAL) 10 MG tablet baclofen 10 mg tablet    ? Blood Glucose Monitoring Suppl (FREESTYLE LITE) DEVI 1 each by Does not apply route 2 (two) times daily. E11.9 1 each 0  ? Blood Pressure Monitoring (OMRON 3 SERIES BP MONITOR) DEVI Use as directed 1 each 0  ? butalbital-acetaminophen-caffeine  (FIORICET) 50-325-40 MG tablet Take 1 tablet by mouth every 6 (six) hours as needed for headache. Do not refill in less than 30 day 30 tablet 1  ? Continuous Blood Gluc Receiver (DEXCOM G6 RECEIVER) DEVI Use as directed. 1 each 1  ? Continuous  Blood Gluc Sensor (DEXCOM G6 SENSOR) MISC Use as directed. Change every 10 days. 9 each 3  ? Continuous Blood Gluc Transmit (DEXCOM G6 TRANSMITTER) MISC Use as directed every 90 days. 1 each 1  ? diazepam (VALIUM) 2 MG tablet Take 1 tablet (2 mg total) by mouth every 6 (six) hours as needed (Headache). 30 tablet 5  ? dicyclomine (BENTYL) 20 MG tablet Take 1 tablet (20 mg total) by mouth every 6 (six) hours. 90 tablet 1  ? EPINEPHrine 0.3 mg/0.3 mL IJ SOAJ injection Inject 0.3 mg into the muscle as needed for anaphylaxis. 1 each 0  ? ergocalciferol (DRISDOL) 1.25 MG (50000 UT) capsule Take 1 capsule (50,000 Units total) by mouth once a week. 4 capsule 2  ? Fremanezumab-vfrm (AJOVY) 225 MG/1.5ML SOSY Inject 1.5 mLs into the skin every 30 (thirty) days. 1.5 mL 6  ? glucose blood (FREESTYLE LITE) test strip 1 each by Other route 2 (two) times daily. E11.9 200 each 0  ? insulin lispro (HUMALOG) 100 UNIT/ML KwikPen Inject 22-24 Units into the skin 2 (two) times daily with a meal. 45 mL 3  ? Ipratropium-Albuterol (COMBIVENT) 20-100 MCG/ACT AERS respimat Inhale 1 puff into the lungs every 6 (six) hours. 4 g 11  ? Lancets (FREESTYLE) lancets 1 each by Other route 2 (two) times daily. E11.9 200 each 0  ? losartan-hydrochlorothiazide (HYZAAR) 50-12.5 MG tablet Take 1 tablet by mouth daily. 90 tablet 1  ? meloxicam (MOBIC) 15 MG tablet Take 1 tablet (15 mg total) by mouth daily. 30 tablet 5  ? metoprolol succinate (TOPROL-XL) 25 MG 24 hr tablet Take 0.5 tablets (12.5 mg total) by mouth daily. 45 tablet 2  ? ondansetron (ZOFRAN ODT) 4 MG disintegrating tablet Take 1 tablet (4 mg total) by mouth every 8 (eight) hours as needed for nausea or vomiting. 20 tablet 0  ? ondansetron (ZOFRAN) 8 MG  tablet Take 1 tablet (8 mg total) by mouth every 8 (eight) hours as needed for nausea or vomiting. 20 tablet 0  ? oxyCODONE-acetaminophen (PERCOCET) 7.5-325 MG tablet Take 1 tablet by mouth every 4 (four) hours as nee

## 2021-10-19 NOTE — Patient Instructions (Addendum)
Please continue: ?- Ozempic 2 mg weekly ? ?Try to start: ?- Metformin ER 500 mg with dinner. If you tolerate this well, increase to 500 mg 2x a day. ?- Tresiba 14 units daily and increase by 2-4 units every 4 days until sugars in am <130 ? ?Try to stop: ?- Humalog, but may need this before large meals (15 min before a meal) ? ?Please return in 3-4 months with your sugar log.  ? ?

## 2021-10-20 ENCOUNTER — Ambulatory Visit: Payer: 59 | Admitting: Endocrinology

## 2021-10-25 ENCOUNTER — Ambulatory Visit: Payer: 59

## 2021-10-25 DIAGNOSIS — M6281 Muscle weakness (generalized): Secondary | ICD-10-CM | POA: Diagnosis not present

## 2021-10-25 DIAGNOSIS — M542 Cervicalgia: Secondary | ICD-10-CM | POA: Diagnosis not present

## 2021-10-25 NOTE — Therapy (Unsigned)
[place] MAIN [facility] SERVICES [address] [place], [place], [address] Phone: [phone]   Fax:  [phone]  Physical Therapy Treatment  Patient Details  Name: [name] MRN: [id number] Date of Birth: 1970-12-28 Referring Provider (PT): Genia Harold, MD   Encounter Date: 10/25/2021    Past Medical History:  Diagnosis Date   Allergy    Anemia    Anxiety    claustrophobic   Asthma    Back pain    Biceps tendonosis of right shoulder    Cataract    Mixed OU   COVID-19    covid PNA hospitalized 2020, 06/06/21   Diabetes mellitus 2011   did not start metforfin, losing weight   Dyspnea    Fatty liver    Food allergy    Frozen shoulder    left, had steroid injection 10/05/21   Headache disorder    History of concussion    HLD (hyperlipidemia)    Hypertension    Hypertensive retinopathy    OU   IBS (irritable bowel syndrome)    Infertility, female    Joint pain    Lactose intolerance    Leg edema    Migraines    Palpitation    Post-menopausal    Seborrheic dermatitis    Shoulder impingement syndrome, right    Uveitis    [place] eye est in 2020 seen specialist in [place] [facility] last seen 11 or 05/2020   Vitamin D deficiency     Past Surgical History:  Procedure Laterality Date   ABDOMINAL HYSTERECTOMY  2006   heavy menses, endometriosis, l oophrectomy   BACK SURGERY     BREAST BIOPSY Right 2018   benign   BREAST EXCISIONAL BIOPSY Right 2003   benign   BREAST EXCISIONAL BIOPSY Right 1999   benign   BREAST SURGERY     right breast x 2 , benign   COLONOSCOPY  10/06/2020   HERNIA REPAIR  2003   left inguinal    LEFT OOPHORECTOMY     SHOULDER ARTHROSCOPY WITH SUBACROMIAL DECOMPRESSION AND OPEN ROTATOR C Right 07/14/2016   Procedure: RIGHT SHOULDER ARTHROSCOPY WITH SUBACROMIAL DECOMPRESSION, DISTAL CLAVICLE RESECTION AND MINI OPEN ROTATOR CUFF REPAIR, OPEN BICEP TENDODESIS;  Surgeon: Garald Balding, MD;   Location: [facility];  Service: Orthopedics;  Laterality: Right;   SHOULDER CLOSED REDUCTION Right 09/08/2016   Procedure: RIGHT CLOSED MANIPULATION SHOULDER;  Surgeon: Garald Balding, MD;  Location: [facility];  Service: Orthopedics;  Laterality: Right;   TUBAL LIGATION     UPPER GASTROINTESTINAL ENDOSCOPY  10/06/2020    There were no vitals filed for this visit.        Elastogel donned R shoulder Exercises - Seated Cervical Sidebending Stretch  - 1 x daily - 7 x weekly - 2 sets - 2 reps - 30 hold - Seated Cervical Rotation Stretch  - 1 x daily - 7 x weekly - 2 sets - 2 reps - 30 hold - Seated Scapular Retraction  - 1 x daily - 7 x weekly - 2 sets - 15 reps   Ulnar nerve glide 2x10  Seated cervical ext. Stretch 40 sec discontinued due to increased pain in shoulders and up her head  Seated scapular squeezes 10x. Must modify technique due to radicular symptoms into R UE.    seated thoracic ext over chair 10x  Supine pec stretch 2x30 sec R side, recreates n/t felt into ring and  pinky  Deep flexor endurance interventions 5x5 sec  Occipital triangle extremely tender to   Chin tucks 3x10 with 3 sec holds  Brought pt into levator stretch, UT stretch, cervical rotation stretch, and stretch over cerivcal extensors  STM to R shoulder and cervical parapsinals - very tender Only slightly sore on L   Isometrics rotation bilat and lat flexion bilat 2 sets of 5 reps each of 5 sec holds  Pt reports pain level increase to 4/10. Reports felt pain increase with occipital STM      PT Short Term Goals - 10/19/21 0841       PT SHORT TERM GOAL #1   Title Patient will be independent in home exercise program to improve strength/mobility for better functional independence with ADLs.    Baseline 5/15: initiated    Time 6    Period Weeks    Status New    Target Date 11/30/21               PT Long Term Goals - 10/19/21 0843       PT LONG TERM  GOAL #1   Title Patient will increase FOTO score to equal to or greater than 53 to demonstrate statistically significant improvement in mobility and quality of life.    Baseline 5/15: 44    Time 12    Period Weeks    Status New    Target Date 01/10/22      PT LONG TERM GOAL #2   Title Patient will report a worst headache pain of 3/10 to improve tolerance with ADLs and reduced symptoms with activities.    Baseline 5/15: worst pain 8/10 (with medication)    Time 12    Period Weeks    Status New    Target Date 01/10/22      PT LONG TERM GOAL #3   Title Patient will increase UE gross strength to 4+/5 as to improve functional strength for increased ADL ability and ability to perform work duties.    Baseline 5/15: Grossly 4/5 BUEs and pain limited    Time 12    Period Weeks    Status New    Target Date 01/10/22      PT LONG TERM GOAL #4   Title The pt will show at least a 10 degree improvement with cervical L lateral flexion, extension, flexion, and bilateral rotatoin.    Baseline 5/16: Ext. 15 deg.   Flex. 23 deg.   Lateral flex: 20 deg. L  Rotation: 20-23 deg Bilat.    Time 12    Period Weeks    Status New    Target Date 01/10/22                    Patient will benefit from skilled therapeutic intervention in order to improve the following deficits and impairments:     Visit Diagnosis: No diagnosis found.     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 09/21/2021   Chronic left shoulder pain 09/21/2021   Sinus tachycardia 06/23/2021   Fatty infiltration of liver 07/26/2020   Diarrhea, functional 06/22/2020   Uveitis, intermediate, bilateral 09/07/2019   Generalized edema 06/13/2019   Abnormal electrocardiogram (ECG) (EKG) 06/07/2019   Hospital discharge follow-up 06/07/2019   Postviral fatigue syndrome 05/30/2019   Multinodular goiter 12/05/2017   Multiple thyroid  nodules 12/05/2017   Microalbuminuria due to type 2  diabetes mellitus ([place]) 11/26/2017   Encounter for preventive health examination 11/26/2017   Menopause syndrome 06/10/2017   Adverse reaction to vaccine, sequela 03/25/2017   Palpitations 02/27/2017   PAC (premature atrial contraction) 02/27/2017   PVC's (premature ventricular contractions) 02/27/2017   PSVT (paroxysmal supraventricular tachycardia) ([place]) 11/26/2016   Nontraumatic incomplete tear of right rotator cuff 07/14/2016   AC (acromioclavicular) arthritis 07/14/2016   Impingement syndrome of right shoulder 07/14/2016   Fibrocystic breast changes, right 05/21/2016   Menopausal symptoms 10/30/2015   Insomnia 10/16/2015   Snoring 07/26/2015   Chronic migraine w/o aura w/o status migrainosus, not intractable 02/24/2015   History of concussion 07/21/2014   Hyperlipidemia associated with type 2 diabetes mellitus ([place]) 06/13/2014   Vitamin D deficiency 05/07/2013   Chronic migraine 11/20/2012   S/P Total Abdominal Hysterectomy and Left Salpingo-oophorectomy 10/20/2011   Headache around the eyes    Uncontrolled type 2 diabetes mellitus 07/20/2011   Obesity (BMI 30-39.9) 07/20/2011   Essential hypertension 07/20/2011    Zollie Pee, PT 10/25/2021, 4:03 PM  [place] MAIN [facility] SERVICES [place], [place], [address] Phone: [phone]   Fax:  [phone]  Name: [name] MRN: [id number] Date of Birth: 03-25-1971

## 2021-11-02 ENCOUNTER — Ambulatory Visit
Admission: RE | Admit: 2021-11-02 | Discharge: 2021-11-02 | Disposition: A | Discharge status: Home / Self Care | Payer: 59 | Source: Ambulatory Visit | Attending: Orthopaedic Surgery | Admitting: Orthopaedic Surgery

## 2021-11-02 DIAGNOSIS — M7502 Adhesive capsulitis of left shoulder: Secondary | ICD-10-CM | POA: Diagnosis not present

## 2021-11-02 DIAGNOSIS — M25511 Pain in right shoulder: Secondary | ICD-10-CM | POA: Diagnosis not present

## 2021-11-02 DIAGNOSIS — Z9889 Other specified postprocedural states: Secondary | ICD-10-CM | POA: Diagnosis not present

## 2021-11-04 ENCOUNTER — Other Ambulatory Visit: Payer: Self-pay

## 2021-11-04 ENCOUNTER — Ambulatory Visit: Payer: 59 | Attending: Psychiatry

## 2021-11-04 ENCOUNTER — Other Ambulatory Visit (HOSPITAL_COMMUNITY): Payer: Self-pay

## 2021-11-04 DIAGNOSIS — M542 Cervicalgia: Secondary | ICD-10-CM | POA: Diagnosis not present

## 2021-11-04 DIAGNOSIS — M25611 Stiffness of right shoulder, not elsewhere classified: Secondary | ICD-10-CM | POA: Diagnosis not present

## 2021-11-04 DIAGNOSIS — M25511 Pain in right shoulder: Secondary | ICD-10-CM | POA: Diagnosis not present

## 2021-11-04 DIAGNOSIS — M6281 Muscle weakness (generalized): Secondary | ICD-10-CM | POA: Insufficient documentation

## 2021-11-04 DIAGNOSIS — G8929 Other chronic pain: Secondary | ICD-10-CM | POA: Diagnosis not present

## 2021-11-04 NOTE — Therapy (Signed)
[place] MAIN [facility] SERVICES [address] [place], [place], [address] Phone: [phone]   Fax:  [phone]  Physical Therapy Treatment  Patient Details  Name: [name] MRN: [id number] Date of Birth: 05/09/1971 Referring Provider (PT): Genia Harold, MD   Encounter Date: 11/04/2021   PT End of Session - 11/04/21 1934     Visit Number 3    Number of Visits 25    Date for PT Re-Evaluation 01/10/22    Authorization Time Period 10/18/21-01/10/22    PT Start Time 1600    PT Stop Time 1642    PT Time Calculation (min) 42 min    Activity Tolerance Patient tolerated treatment well;Patient limited by pain    Behavior During Therapy [facility] for tasks assessed/performed             Past Medical History:  Diagnosis Date   Allergy    Anemia    Anxiety    claustrophobic   Asthma    Back pain    Biceps tendonosis of right shoulder    Cataract    Mixed OU   COVID-19    covid PNA hospitalized 2020, 06/06/21   Diabetes mellitus 2011   did not start metforfin, losing weight   Dyspnea    Fatty liver    Food allergy    Frozen shoulder    left, had steroid injection 10/05/21   Headache disorder    History of concussion    HLD (hyperlipidemia)    Hypertension    Hypertensive retinopathy    OU   IBS (irritable bowel syndrome)    Infertility, female    Joint pain    Lactose intolerance    Leg edema    Migraines    Palpitation    Post-menopausal    Seborrheic dermatitis    Shoulder impingement syndrome, right    Uveitis    [place] [facility] in 2020 seen specialist in [place] [facility] last seen 11 or 05/2020   Vitamin D deficiency     Past Surgical History:  Procedure Laterality Date   ABDOMINAL HYSTERECTOMY  2006   heavy menses, endometriosis, l oophrectomy   BACK SURGERY     BREAST BIOPSY Right 2018   benign   BREAST EXCISIONAL BIOPSY Right 2003   benign   BREAST EXCISIONAL BIOPSY Right 1999   benign   BREAST SURGERY      right breast x 2 , benign   COLONOSCOPY  10/06/2020   HERNIA REPAIR  2003   left inguinal    LEFT OOPHORECTOMY     SHOULDER ARTHROSCOPY WITH SUBACROMIAL DECOMPRESSION AND OPEN ROTATOR C Right 07/14/2016   Procedure: RIGHT SHOULDER ARTHROSCOPY WITH SUBACROMIAL DECOMPRESSION, DISTAL CLAVICLE RESECTION AND MINI OPEN ROTATOR CUFF REPAIR, OPEN BICEP TENDODESIS;  Surgeon: Garald Balding, MD;  Location: [place];  Service: Orthopedics;  Laterality: Right;   SHOULDER CLOSED REDUCTION Right 09/08/2016   Procedure: RIGHT CLOSED MANIPULATION SHOULDER;  Surgeon: Garald Balding, MD;  Location: [place];  Service: Orthopedics;  Laterality: Right;   TUBAL LIGATION     UPPER GASTROINTESTINAL ENDOSCOPY  10/06/2020    There were no vitals filed for this visit.   Subjective Assessment - 11/04/21 1933     Subjective Patient presents from work, heading to second job after PT session.    Pertinent History Pt is a pleasant 51 y/o female presenting to PT due to chronic migraine headaches with cervical and bilateral  shoulder pain. Pt has headaches every day. Pt reports onset of migraines at 53-49 years old where she had to seek emergency care due to severity of migraines. Pt reports COVID19 illness in 2020 and that migraines have been different since, including n/t that can occur on one side of her face (pt being followed by neurologist). Pt reports she has tried various medications, muscle relaxers to control migraines and related pain but that nothing has worked well yet. Other symptoms with her migraines include motion sickness, nausea, spinning sensation with her eyes closed, light and noise sensitivity, heightened sensitivity to touch on her face and scalp. 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  Pt with current R shoulder pain (reports has "possibly re-torn" R RTC), chronic R restrictions since 2020-2021 of no lifting greater than 10-15 lbs, no reaching behind, no reaching above her head. Hx of R RTC repair 2018 per chart with dx of R shoulder adhesive capsulitis. Pt currently being treated for adhesive capsulitis of L shoulder..  Per chart other PMH significant for PMH: anxiety (claustrophobic), back pain, asthma, biceps tendonosis of R shoulder, DM, dyspnea, L frozen shouler (steroid injection 10/05/21), hx of concussion, headache disorder, HTN, HTN retinopathy, IBS, joint pain, leg edema, palpitation, R shoulder impingement syndrome, vitamin D deficiency, uveitis, paroxysmal supraventricular tachycardia, shoulder arthroscopy with subacromial decompression and mini open RTC repair, open bicep tenodesis.    Limitations House hold activities;Other (comment)   Headaches frequent, completely limit activity with increased pain   Diagnostic tests DG L shoulder 09/29/21: "IMPRESSION:  No significant osteoarthritis."; upcoming MRI for R shoulder    Patient Stated Goals Decrease pain and stiffness    Currently in Pain? Yes    Pain Score 3     Pain Location Head    Pain Orientation Right    Pain Descriptors / Indicators Aching    Pain Type Chronic pain    Pain Onset More than a month ago    Pain Frequency Intermittent               INTERVENTIONS- Manual:  Suboccipital release with distraction 5x30 seconds Cervical side bend with overprssure and glenohumeral joint and occiput 4x 30 seconds each side Grade II mobilizations CPA and UPA to cervical spine J mobilization 3x20 seconds Seated STM to cervical paraspinals with implementation of effleurage and pettrisage x 8 minutes  therEx: Supine:  -chin tuck 10x  -scapular retraction with contralateral cervical rotation 10x each side Seated: -scapular retraction 10x 3 second holds -ulnar nerve glide 10x (added to HEP) Standing: -wall  posture against doorframe 10x 3 second holds  .   Pt educated throughout session about proper posture and technique with exercises. Improved exercise technique, movement at target joints, use of target muscles after min to mod verbal, visual, tactile cues. Rationale for Evaluation and Treatment Rehabilitation   Patient is educated on importance of posture in coordination with pain. She is educated on TDN as a therapeutic tool but declines TDN as she has had negative experience in the past. Significant muscle tension present in cervical spine and upper traps. The pt will benefit from further skilled PT to improve pain, ROM, and strength to increase QOL  PT Education - 11/04/21 1933     Education Details posture, pain reduction    Person(s) Educated Patient    Methods Explanation;Demonstration;Tactile cues;Verbal cues    Comprehension Verbalized understanding;Returned demonstration;Verbal cues required;Tactile cues required              PT Short Term Goals - 10/19/21 0841       PT SHORT TERM GOAL #1   Title Patient will be independent in home exercise program to improve strength/mobility for better functional independence with ADLs.    Baseline 5/15: initiated    Time 6    Period Weeks    Status New    Target Date 11/30/21               PT Long Term Goals - 10/19/21 0843       PT LONG TERM GOAL #1   Title Patient will increase FOTO score to equal to or greater than 53 to demonstrate statistically significant improvement in mobility and quality of life.    Baseline 5/15: 44    Time 12    Period Weeks    Status New    Target Date 01/10/22      PT LONG TERM GOAL #2   Title Patient will report a worst headache pain of 3/10 to improve tolerance with ADLs and reduced symptoms with activities.    Baseline 5/15: worst pain 8/10 (with medication)    Time 12    Period Weeks    Status New    Target Date 01/10/22      PT LONG  TERM GOAL #3   Title Patient will increase UE gross strength to 4+/5 as to improve functional strength for increased ADL ability and ability to perform work duties.    Baseline 5/15: Grossly 4/5 BUEs and pain limited    Time 12    Period Weeks    Status New    Target Date 01/10/22      PT LONG TERM GOAL #4   Title The pt will show at least a 10 degree improvement with cervical L lateral flexion, extension, flexion, and bilateral rotatoin.    Baseline 5/16: Ext. 15 deg.   Flex. 23 deg.   Lateral flex: 20 deg. L  Rotation: 20-23 deg Bilat.    Time 12    Period Weeks    Status New    Target Date 01/10/22                   Plan - 11/04/21 1939     Clinical Impression Statement Patient is educated on importance of posture in coordination with pain. She is educated on TDN as a therapeutic tool but declines TDN as she has had negative experience in the past. Significant muscle tension present in cervical spine and upper traps. The pt will benefit from further skilled PT to improve pain, ROM, and strength to increase QOL    Personal Factors and Comorbidities Comorbidity 3+;Sex;Time since onset of injury/illness/exacerbation    Comorbidities PMH: anxiety (claustrophobic), back pain, asthma, biceps tendonosis of R shoulder, DM, dyspnea, L frozen shouler (steroid injection 10/05/21), hx of concussion, headache disorder, HTN, HTN retinopathy, IBS, joint pain, leg edema, palpitation, R shoulder impingement syndrome, vitamin D difictiency, uveitis, paroxysmal supraventricular tachycardia, shoulder arthroscopy with subacromial decompression and mini open RTC repair, open bicep tenodesis    Examination-Activity Limitations Locomotion Level;Caring for Others;Other   with increased pain pt reports must rest, limits ability to perform job duties/ADLs  Examination-Participation Restrictions Occupation;Community Activity;Shop;Driving;Cleaning;Yard Work;Other   stiffness of cervical spine limits ability to  look over shoulder with driving   Stability/Clinical Decision Making Evolving/Moderate complexity    Rehab Potential Good    PT Frequency 2x / week    PT Duration 12 weeks    PT Treatment/Interventions ADLs/Self Care Home Management;Biofeedback;Canalith Repostioning;Cryotherapy;Electrical Stimulation;Moist Heat;Traction;Ultrasound;DME Instruction;Gait training;Stair training;Functional mobility training;Therapeutic exercise;Balance training;Neuromuscular re-education;Patient/family education;Orthotic Fit/Training;Therapeutic activities;Manual techniques;Scar mobilization;Manual lymph drainage;Compression bandaging;Passive range of motion;Dry needling;Energy conservation;Splinting;Taping;Vestibular;Spinal Manipulations;Joint Manipulations    PT Next Visit Plan mobility, stretching, manual strengthening within pain tolerance    PT Home Exercise Plan Access Code: [email or web address] Ulnar nerve glide added to HEP to be performed daily    Consulted and Agree with Plan of Care Patient             Patient will benefit from skilled therapeutic intervention in order to improve the following deficits and impairments:  Decreased activity tolerance, Pain, Impaired UE functional use, Impaired sensation, Improper body mechanics, Dizziness, Decreased strength, Decreased range of motion, Increased muscle spasms, Impaired flexibility, Decreased balance, Decreased mobility (pt endorses affect on balance with increased headache)  Visit Diagnosis: Cervicalgia  Muscle weakness (generalized)     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 09/21/2021   Chronic left shoulder pain 09/21/2021   Sinus tachycardia 06/23/2021   Fatty infiltration of liver 07/26/2020   Diarrhea, functional 06/22/2020   Uveitis, intermediate, bilateral 09/07/2019   Generalized edema 06/13/2019   Abnormal electrocardiogram (ECG) (EKG) 06/07/2019    Hospital discharge follow-up 06/07/2019   Postviral fatigue syndrome 05/30/2019   Multinodular goiter 12/05/2017   Multiple thyroid nodules 12/05/2017   Microalbuminuria due to type 2 diabetes mellitus ([place]) 11/26/2017   Encounter for preventive health examination 11/26/2017   Menopause syndrome 06/10/2017   Adverse reaction to vaccine, sequela 03/25/2017   Palpitations 02/27/2017   PAC (premature atrial contraction) 02/27/2017   PVC's (premature ventricular contractions) 02/27/2017   PSVT (paroxysmal supraventricular tachycardia) ([place]) 11/26/2016   Nontraumatic incomplete tear of right rotator cuff 07/14/2016   AC (acromioclavicular) arthritis 07/14/2016   Impingement syndrome of right shoulder 07/14/2016   Fibrocystic breast changes, right 05/21/2016   Menopausal symptoms 10/30/2015   Insomnia 10/16/2015   Snoring 07/26/2015   Chronic migraine w/o aura w/o status migrainosus, not intractable 02/24/2015   History of concussion 07/21/2014   Hyperlipidemia associated with type 2 diabetes mellitus ([place]) 06/13/2014   Vitamin D deficiency 05/07/2013   Chronic migraine 11/20/2012   S/P Total Abdominal Hysterectomy and Left Salpingo-oophorectomy 10/20/2011   Headache around the eyes    Uncontrolled type 2 diabetes mellitus 07/20/2011   Obesity (BMI 30-39.9) 07/20/2011   Essential hypertension 07/20/2011    Janna Arch, PT, DPT  11/04/2021, 7:40 PM  [place] MAIN [facility] SERVICES [address] [place], [place], [address] Phone: [phone]   Fax:  [phone]  Name: DELAINIE CHAVANA MRN: [id number] Date of Birth: 1970/06/07

## 2021-11-07 ENCOUNTER — Telehealth: Payer: Self-pay | Admitting: Pharmacy Technician

## 2021-11-07 NOTE — Telephone Encounter (Signed)
Patient Advocate Encounter  Received notification from [place] that prior authorization for [facility] G6 SENSORS is required.   PA submitted on 6.4.23 Key [id number] Status is pending   [facility] will continue to follow  [name], CPhT Patient Advocate [facility] Phone: [phone] Fax:  [phone]

## 2021-11-09 ENCOUNTER — Ambulatory Visit: Payer: 59 | Admitting: Psychiatry

## 2021-11-09 ENCOUNTER — Other Ambulatory Visit: Payer: Self-pay

## 2021-11-09 ENCOUNTER — Other Ambulatory Visit (HOSPITAL_COMMUNITY): Payer: Self-pay

## 2021-11-09 VITALS — BP 131/89 | HR 94

## 2021-11-09 DIAGNOSIS — M5481 Occipital neuralgia: Secondary | ICD-10-CM

## 2021-11-09 DIAGNOSIS — M542 Cervicalgia: Secondary | ICD-10-CM | POA: Diagnosis not present

## 2021-11-09 NOTE — Progress Notes (Signed)
Procedure: Occipital Nerve injection/Trigger point injection  Location: bilateral occiput  The risks, benefits and anticipated outcomes of the procedure, the risks and benefits of the alternatives to the procedure, and the roles and tasks of the personnel to be involved, were discussed with the patient, and the patient consents to the procedure and agrees to proceed.     5cc of 0.25% bupivacaine were prepared in 2 syringes (5 cc).  2 trigger points on the splenius capitus were identified and injected. The left and right greater occipital nerves were injected 3cm caudal and 1.5 cm lateral to the inion where the main trunk of the occipital nerve penetrates the semispinalis muscle.  The needle was placed perpendicular and the needle advanced 1.5 cm. After aspiration to ensure no obstruction or presence of blood, the area was injected.  The needle was repositioned in a fan-like manner and the entire area was injected. Pressure was held and no hematoma was noted.   Genia Harold, MD 11/09/21 2:23 PM

## 2021-11-09 NOTE — Telephone Encounter (Addendum)
Patient Advocate Encounter  Prior Authorization for Dexcom G6 sensors has been approved.    PA#  PA Case ID: [id number] Effective dates: 11/08/21 through 11/08/22  Per Test Claim Patients co-pay is $197.94 for a 3 month supply.   Spoke with Pharmacy to process.

## 2021-11-11 ENCOUNTER — Ambulatory Visit (INDEPENDENT_AMBULATORY_CARE_PROVIDER_SITE_OTHER): Payer: 59 | Admitting: Orthopaedic Surgery

## 2021-11-11 DIAGNOSIS — S46011A Strain of muscle(s) and tendon(s) of the rotator cuff of right shoulder, initial encounter: Secondary | ICD-10-CM | POA: Diagnosis not present

## 2021-11-11 NOTE — Progress Notes (Signed)
Chief Complaint: Bilateral shoulder pain     History of Present Illness:   11/11/2021: For follow-up of the left as well as right shoulder.  At this time the left shoulder feels essentially normal.  She is continuing to experience weakness and soreness in the right shoulder.  She recently had trigger point injections for the right shoulder.  This resulted in a flareup of her trapezius and neck pain.  Here today for MRI discussion of right shoulder  [name] is a 51 y.o. female right-hand-dominant presents with left shoulder pain that has been ongoing for 6 months now.  She has no known injury.  She does have a history of diabetes.  She states that she had previously had right shoulder rotator cuff repair and has subsequently used her left arm in order to compensate.  She is scheduled for trip to [place] the upcoming day to go see her daughter's cheer competition.  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  She is status post right shoulder rotator cuff repair done with Dr. Durward Fortes in 2018    Surgical History:   Right shoulder rotator cuff repair done in 2018  PMH/PSH/Family History/Social History/Meds/Allergies:    Past Medical History:  Diagnosis Date   Allergy    Anemia    Anxiety    claustrophobic   Asthma    Back pain    Biceps tendonosis of right shoulder    Cataract    Mixed OU   COVID-19    covid PNA hospitalized 2020, 06/06/21   Diabetes mellitus 2011   did not start metforfin, losing weight   Dyspnea    Fatty liver    Food allergy    Frozen shoulder    left, had steroid injection 10/05/21   Headache disorder    History of concussion    HLD (hyperlipidemia)    Hypertension    Hypertensive retinopathy    OU   IBS (irritable bowel syndrome)    Infertility, female    Joint pain    Lactose intolerance    Leg edema    Migraines    Palpitation    Post-menopausal    Seborrheic dermatitis    Shoulder impingement  syndrome, right    Uveitis    [facility] in 2020 seen specialist in [place] [facility] last seen 11 or 05/2020   Vitamin D deficiency    Past Surgical History:  Procedure Laterality Date   ABDOMINAL HYSTERECTOMY  2006   heavy menses, endometriosis, l oophrectomy   BACK SURGERY     BREAST BIOPSY Right 2018   benign   BREAST EXCISIONAL BIOPSY Right 2003   benign   BREAST EXCISIONAL BIOPSY Right 1999   benign   BREAST SURGERY     right breast x 2 , benign   COLONOSCOPY  10/06/2020   HERNIA REPAIR  2003   left inguinal    LEFT OOPHORECTOMY     SHOULDER ARTHROSCOPY WITH SUBACROMIAL DECOMPRESSION AND OPEN ROTATOR C Right 07/14/2016   Procedure: RIGHT SHOULDER ARTHROSCOPY WITH SUBACROMIAL DECOMPRESSION, DISTAL CLAVICLE RESECTION AND MINI OPEN ROTATOR CUFF REPAIR, OPEN BICEP TENDODESIS;  Surgeon: Garald Balding, MD;  Location: [facility];  Service: Orthopedics;  Laterality: Right;   SHOULDER CLOSED REDUCTION Right 09/08/2016   Procedure:  RIGHT CLOSED MANIPULATION SHOULDER;  Surgeon: Garald Balding, MD;  Location: [facility];  Service: Orthopedics;  Laterality: Right;   TUBAL LIGATION     UPPER GASTROINTESTINAL ENDOSCOPY  10/06/2020   Social History   Socioeconomic History   Marital status: Divorced    Spouse name: Not on file   Number of children: 2   Years of education: 14   Highest education level: Master's degree (e.g., MA, MS, MEng, MEd, MSW, MBA)  Occupational History   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   Right-handed.   1-3 cups caffeine weekly.   Social  Determinants of Health   Financial Resource Strain: Not on file  Food Insecurity: Not on file  Transportation Needs: Not on file  Physical Activity: Not on file  Stress: Not on file  Social Connections: Not on file   Family History  Problem Relation Age of Onset   Diabetes Mother    Heart disease Mother 85   Hypertension Mother    Hyperlipidemia Mother    Heart failure Mother    Stroke Mother    Thyroid disease Mother    Depression Mother    Sleep apnea Mother    Obesity Mother    Colon polyps Mother    Cancer Father 46       Lung Cancer   Heart disease Father    Mental illness Sister        bipolar, substance abuse,  clean 2 yrs   Hypertension Sister    Cancer Paternal Grandmother 26       breast cancer   Breast cancer Paternal Grandmother    Diabetes Maternal Grandmother    Hypertension Maternal Grandmother    Diabetes Son    Colon cancer Neg Hx    Esophageal cancer Neg Hx    Rectal cancer Neg Hx    Stomach cancer Neg Hx    Allergies  Allergen Reactions   Bamlanivimab Anaphylaxis, Itching, Palpitations, Rash, Shortness Of Breath and Swelling   Botox [Onabotulinumtoxina] Shortness Of Breath    syncope   Clindamycin/Lincomycin Other (See Comments)   Kiwi Extract Shortness Of Breath   Maxalt [Rizatriptan Benzoate] Anaphylaxis    Chest pain   Nitrous Oxide Shortness Of Breath    syncope   Rizatriptan Benzoate Anaphylaxis    Chest pain   Triptans Shortness Of Breath   Vyepti [Eptinezumab-Jjmr] Shortness Of Breath, Palpitations and Other (See Comments)    Throat soreness, tachycardia   Aspirin Nausea And Vomiting    Mouth blisters   Contrast Media [Iodinated Contrast Media]    Erythromycin    Flagyl [Metronidazole]    Haemophilus Influenzae Vaccines    Latex     Sometimes causes rash   Mango Flavor    Vicodin [Hydrocodone-Acetaminophen]     hallucinations   Influenza Virus Vaccine Palpitations   Penicillins Nausea And Vomiting, Rash and Other (See  Comments)    Did it involve swelling of the face/tongue/throat, SOB, or low BP? No Did it involve sudden or  severe rash/hives, skin peeling, or any reaction on the inside of your mouth or nose? No Did you need to seek medical attention at a hospital or doctor's office? Yes When did it last happen?     patient was 51 years old  If all above answers are "NO", may proceed with cephalosporin use.   Tetracyclines & Related Nausea And Vomiting and Rash   Current Outpatient Medications  Medication Sig Dispense Refill   albuterol (VENTOLIN HFA) 108 (90 Base) MCG/ACT inhaler Inhale 2 puffs into the lungs every 6 (six) hours as needed for wheezing or shortness of breath. 18 g 0   ALPRAZolam (XANAX) 0.5 MG tablet Take 1 tablet (0.5 mg total) by mouth 3 (three) times daily as needed (Headache).     aspirin EC 81 MG tablet Take 81 mg by mouth daily.     Atogepant (QULIPTA) 60 MG TABS Take 60 mg by mouth daily. 30 tablet 3   atorvastatin (LIPITOR) 20 MG tablet Take 1 tablet (20 mg total) by mouth daily. 90 tablet 3   baclofen (LIORESAL) 10 MG tablet baclofen 10 mg tablet     Blood Glucose Monitoring Suppl (FREESTYLE LITE) DEVI 1 each by Does not apply route 2 (two) times daily. E11.9 1 each 0   Blood Pressure Monitoring (OMRON 3 SERIES BP MONITOR) DEVI Use as directed 1 each 0   butalbital-acetaminophen-caffeine (FIORICET) 50-325-40 MG tablet Take 1 tablet by mouth every 6 (six) hours as needed for headache. Do not refill in less than 30 day 30 tablet 1   Continuous Blood Gluc Receiver (DEXCOM G6 RECEIVER) DEVI Use as directed. 1 each 1   Continuous Blood Gluc Sensor (DEXCOM G6 SENSOR) MISC Use as directed. Change every 10 days. 9 each 3   Continuous Blood Gluc Transmit (DEXCOM G6 TRANSMITTER) MISC Use as directed every 90 days. 1 each 1   diazepam (VALIUM) 2 MG tablet Take 1 tablet (2 mg total) by mouth every 6 (six) hours as needed (Headache). 30 tablet 5   dicyclomine (BENTYL) 20 MG tablet Take 1 tablet  (20 mg total) by mouth every 6 (six) hours. 90 tablet 1   EPINEPHrine 0.3 mg/0.3 mL IJ SOAJ injection Inject 0.3 mg into the muscle as needed for anaphylaxis. 1 each 0   ergocalciferol (DRISDOL) 1.25 MG (50000 UT) capsule Take 1 capsule (50,000 Units total) by mouth once a week. 4 capsule 2   glucose blood (FREESTYLE LITE) test strip 1 each by Other route 2 (two) times daily. E11.9 200 each 0   insulin degludec (TRESIBA FLEXTOUCH) 200 UNIT/ML FlexTouch Pen Inject 20 Units into the skin daily. 9 mL 3   insulin lispro (HUMALOG) 100 UNIT/ML KwikPen Inject 22-24 Units into the skin 2 (two) times daily with a meal. 45 mL 3   Insulin Pen Needle (UNIFINE PENTIPS) 32G X 4 MM MISC Use 1-2x a day 200 each 4   Ipratropium-Albuterol (COMBIVENT) 20-100 MCG/ACT AERS respimat Inhale 1 puff into the lungs every 6 (six) hours. 4 g 11   Lancets (FREESTYLE) lancets 1 each by Other route 2 (two) times daily. E11.9 200 each 0   losartan-hydrochlorothiazide (HYZAAR) 50-12.5 MG tablet Take 1 tablet by mouth daily. 90 tablet 1   meloxicam (MOBIC) 15 MG tablet Take 1 tablet (15 mg total) by mouth daily. 30 tablet 5   metFORMIN (GLUCOPHAGE-XR) 500 MG 24 hr tablet Take 2 tablets (1,000 mg total) by mouth daily with supper. 180 tablet 3   metoprolol succinate (TOPROL-XL)  25 MG 24 hr tablet Take 0.5 tablets (12.5 mg total) by mouth daily. 45 tablet 2   ondansetron (ZOFRAN ODT) 4 MG disintegrating tablet Take 1 tablet (4 mg total) by mouth every 8 (eight) hours as needed for nausea or vomiting. 20 tablet 0   ondansetron (ZOFRAN) 8 MG tablet Take 1 tablet (8 mg total) by mouth every 8 (eight) hours as needed for nausea or vomiting. 20 tablet 0   oxyCODONE-acetaminophen (PERCOCET) 7.5-325 MG tablet Take 1 tablet by mouth every 4 (four) hours as needed for severe pain. 15 tablet 0   pantoprazole (PROTONIX) 40 MG tablet Take 1 tablet (40 mg total) by mouth daily. Take only with water and take 30 minutes before eating. 90 tablet 3    Semaglutide, 2 MG/DOSE, (OZEMPIC, 2 MG/DOSE,) 8 MG/3ML SOPN Inject 2 mg into the skin once a week. 9 mL 3   traZODone (DESYREL) 50 MG tablet Take 0.5-1 tablets (25-50 mg total) by mouth at bedtime as needed for sleep. 90 tablet 0   No current facility-administered medications for this visit.   No results found.  Review of Systems:   A ROS was performed including pertinent positives and negatives as documented in the HPI.  Physical Exam :   Constitutional: NAD and appears stated age Neurological: Alert and oriented Psych: Appropriate affect and cooperative There were no vitals taken for this visit.   Comprehensive Musculoskeletal Exam:    Musculoskeletal Exam    Inspection Right Left  Skin No atrophy or winging No atrophy or winging  Palpation    Tenderness Lateral deltoid None  Range of Motion    Flexion (passive) 120 170  Flexion (active) 120 160  Abduction 100 160  ER at the side 50 70  Can reach behind back to T12 T12  Strength     4/5 5/5  Special Tests    Pseudoparalytic No No  Neurologic    Fires PIN, radial, median, ulnar, musculocutaneous, axillary, suprascapular, long thoracic, and spinal accessory innervated muscles. No abnormal sensibility  Vascular/Lymphatic    Radial Pulse 2+ 2+  Cervical Exam    Patient has symmetric cervical range of motion with negative Spurling's test.  Special Test: Positive Neer impingement on the right     Imaging:   Xray (views left shoulder): Normal  MRI right shoulder: Intact rotator cuff repair although this is quite thin.  There is significant fluid around the biceps tendon.  There is thickening of the inferior capsule as well as obliteration of the space.  I personally reviewed and interpreted the radiographs.   Assessment:   51 y.o. female right-hand-dominant presents with left shoulder pain consistent with adhesive capsulitis that is now resolved.  With regard to the right shoulder I described that her exam is more  consistent with a postoperative fibrosis picture.  This is particularly true given her very thick inferior capsule.  To this effect I do believe she would benefit from right shoulder physical therapy to improve range of motion and strength.  I did also discuss that I do believe she would benefit from an intra-articular injection although she is hoping to defer this given that the last injection significantly elevated her blood sugars.  I will plan to see her back in 6 weeks after she completes additional physical therapy at that time we will consider a right shoulder intra-articular injection  Plan :    -Return to clinic in 6 weeks      I personally saw and evaluated  the patient, and participated in the management and treatment plan.  Vanetta Mulders, MD Attending Physician, Orthopedic Surgery  This document was dictated using Dragon voice recognition software. A reasonable attempt at proof reading has been made to minimize errors.

## 2021-11-15 ENCOUNTER — Ambulatory Visit: Payer: 59 | Admitting: Physical Therapy

## 2021-11-17 ENCOUNTER — Other Ambulatory Visit (HOSPITAL_COMMUNITY): Payer: Self-pay

## 2021-11-17 ENCOUNTER — Ambulatory Visit: Payer: 59

## 2021-11-24 ENCOUNTER — Other Ambulatory Visit: Payer: Self-pay

## 2021-11-24 ENCOUNTER — Ambulatory Visit: Payer: 59 | Admitting: Physical Therapy

## 2021-11-25 ENCOUNTER — Ambulatory Visit: Payer: 59

## 2021-11-25 DIAGNOSIS — M6281 Muscle weakness (generalized): Secondary | ICD-10-CM

## 2021-11-25 DIAGNOSIS — M25611 Stiffness of right shoulder, not elsewhere classified: Secondary | ICD-10-CM | POA: Diagnosis not present

## 2021-11-25 DIAGNOSIS — G8929 Other chronic pain: Secondary | ICD-10-CM

## 2021-11-25 DIAGNOSIS — M25511 Pain in right shoulder: Secondary | ICD-10-CM | POA: Diagnosis not present

## 2021-11-25 DIAGNOSIS — M542 Cervicalgia: Secondary | ICD-10-CM | POA: Diagnosis not present

## 2021-11-25 NOTE — Therapy (Signed)
OUTPATIENT PHYSICAL THERAPY TREATMENT NOTE/Re-EVAL for shoulder   Patient Name: [name] MRN: [id number] DOB:1970-12-29, 51 y.o., female Today's Date: 11/25/2021  PCP: Crecencio Mc MD  REFERRING PROVIDER: Vanetta Mulders MD   PT End of Session - 11/25/21 1907     Visit Number 4    Number of Visits 25    Date for PT Re-Evaluation 01/10/22    Authorization Time Period 10/18/21-01/10/22    PT Start Time 1606    PT Stop Time 6283    PT Time Calculation (min) 40 min    Activity Tolerance Patient limited by pain    Behavior During Therapy [facility] for tasks assessed/performed             Past Medical History:  Diagnosis Date   Allergy    Anemia    Anxiety    claustrophobic   Asthma    Back pain    Biceps tendonosis of right shoulder    Cataract    Mixed OU   COVID-19    covid PNA hospitalized 2020, 06/06/21   Diabetes mellitus 2011   did not start metforfin, losing weight   Dyspnea    Fatty liver    Food allergy    Frozen shoulder    left, had steroid injection 10/05/21   Headache disorder    History of concussion    HLD (hyperlipidemia)    Hypertension    Hypertensive retinopathy    OU   IBS (irritable bowel syndrome)    Infertility, female    Joint pain    Lactose intolerance    Leg edema    Migraines    Palpitation    Post-menopausal    Seborrheic dermatitis    Shoulder impingement syndrome, right    Uveitis     eye est in 2020 seen specialist in [place] [facility] last seen 11 or 05/2020   Vitamin D deficiency    Past Surgical History:  Procedure Laterality Date   ABDOMINAL HYSTERECTOMY  2006   heavy menses, endometriosis, l oophrectomy   BACK SURGERY     BREAST BIOPSY Right 2018   benign   BREAST EXCISIONAL BIOPSY Right 2003   benign   BREAST EXCISIONAL BIOPSY Right 1999   benign   BREAST SURGERY     right breast x 2 , benign   COLONOSCOPY  10/06/2020   HERNIA REPAIR  2003   left inguinal    LEFT OOPHORECTOMY     SHOULDER  ARTHROSCOPY WITH SUBACROMIAL DECOMPRESSION AND OPEN ROTATOR C Right 07/14/2016   Procedure: RIGHT SHOULDER ARTHROSCOPY WITH SUBACROMIAL DECOMPRESSION, DISTAL CLAVICLE RESECTION AND MINI OPEN ROTATOR CUFF REPAIR, OPEN BICEP TENDODESIS;  Surgeon: Garald Balding, MD;  Location: [place];  Service: Orthopedics;  Laterality: Right;   SHOULDER CLOSED REDUCTION Right 09/08/2016   Procedure: RIGHT CLOSED MANIPULATION SHOULDER;  Surgeon: Garald Balding, MD;  Location: [place];  Service: Orthopedics;  Laterality: Right;   TUBAL LIGATION     UPPER GASTROINTESTINAL ENDOSCOPY  10/06/2020   Patient Active Problem List   Diagnosis Date Noted   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 09/21/2021   Chronic left shoulder pain 09/21/2021   Sinus tachycardia 06/23/2021   Fatty infiltration of liver 07/26/2020   Diarrhea, functional 06/22/2020   Uveitis, intermediate, bilateral 09/07/2019   Generalized edema 06/13/2019   Abnormal electrocardiogram (ECG) (EKG) 06/07/2019   Hospital discharge follow-up 06/07/2019   Postviral fatigue syndrome 05/30/2019  Multinodular goiter 12/05/2017   Multiple thyroid nodules 12/05/2017   Microalbuminuria due to type 2 diabetes mellitus ([place]) 11/26/2017   Encounter for preventive health examination 11/26/2017   Menopause syndrome 06/10/2017   Adverse reaction to vaccine, sequela 03/25/2017   Palpitations 02/27/2017   PAC (premature atrial contraction) 02/27/2017   PVC's (premature ventricular contractions) 02/27/2017   PSVT (paroxysmal supraventricular tachycardia) ([place]) 11/26/2016   Nontraumatic incomplete tear of right rotator cuff 07/14/2016   AC (acromioclavicular) arthritis 07/14/2016   Impingement syndrome of right shoulder 07/14/2016   Fibrocystic breast changes, right 05/21/2016   Menopausal symptoms 10/30/2015   Insomnia 10/16/2015   Snoring 07/26/2015   Chronic migraine w/o aura w/o status  migrainosus, not intractable 02/24/2015   History of concussion 07/21/2014   Hyperlipidemia associated with type 2 diabetes mellitus ([place]) 06/13/2014   Vitamin D deficiency 05/07/2013   Chronic migraine 11/20/2012   S/P Total Abdominal Hysterectomy and Left Salpingo-oophorectomy 10/20/2011   Headache around the eyes    Uncontrolled type 2 diabetes mellitus 07/20/2011   Obesity (BMI 30-39.9) 07/20/2011   Essential hypertension 07/20/2011    REFERRING DIAG: traumatic incomplete tear of R rotator cuff/migraine/cervicalgia   THERAPY DIAG:  Cervicalgia  Muscle weakness (generalized)  Chronic right shoulder pain  Stiffness of right shoulder, not elsewhere classified  Rationale for Evaluation and Treatment Rehabilitation  PERTINENT HISTORY: Pt is a pleasant 51 y/o female presenting to PT due to chronic migraine headaches with cervical and bilateral shoulder pain. Pt has headaches every day. Pt reports onset of migraines at 48-58 years old where she had to seek emergency care due to severity of migraines. Pt reports COVID19 illness in 2020 and that migraines have been different since, including n/t that can occur on one side of her face (pt being followed by neurologist). Pt reports she has tried various medications, muscle relaxers to control migraines and related pain but that nothing has worked well yet. Other symptoms with her migraines include motion sickness, nausea, spinning sensation with her eyes closed, light and noise sensitivity, heightened sensitivity to touch on her face and scalp. 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 Pt with current R shoulder pain (reports has "possibly re-torn" R RTC), chronic R restrictions since 2020-2021 of no lifting greater than 10-15 lbs, no reaching behind, no reaching  above her head. Hx of R RTC repair 2018 per chart with dx of R shoulder adhesive capsulitis. Pt currently being treated for adhesive capsulitis of L shoulder..  Per chart other PMH significant for PMH: anxiety (claustrophobic), back pain, asthma, biceps tendonosis of R shoulder, DM, dyspnea, L frozen shouler (steroid injection 10/05/21), hx of concussion, headache disorder, HTN, HTN retinopathy, IBS, joint pain, leg edema, palpitation, R shoulder impingement syndrome, vitamin D deficiency, uveitis, paroxysmal supraventricular tachycardia, shoulder arthroscopy with subacromial decompression and mini open RTC repair, open bicep tenodesis  PRECAUTIONS: R shoulder; permanent restrictions. Do not lift more than 15lb.   SUBJECTIVE:  Patient presents with new order to add to current POC: Traumatic incomplete tear of right rotator cuff, initial encounter; tendinosis. Patient tore her rotator cuff in Feb 2018, a week from that day she followed up and had surgery. Was out of work for 14 weeks due to having frozen shoulder. In December 2018 re-injured her shoulder with a work related injury  lifting someone. Since then she has seen a physiatrist and had nerve conduction study. Saw physiatrist until November of last year due to weakness, pain, and limited ROM. When she went in for her left shoulder in April of 2023 was told to have capsulitis and had steroid injection. Patient had trigger point injections that helped her shoulder.  Went to physician who wants her to try PT prior to attempting surgery. Patient has permanent restrictions with overhead lifting, pushing pulling. Unable to lift more than 15 lb. MRI: Postoperative changes of the distal supraspinatus tendon with tendon thinning and fraying but no recurrent high-grade or retracted tear.Mild distal infraspinatus tendinosis. No significant muscle atrophy.Severe tendinosis of the intra-articular long head biceps tendon.  PAIN:  Are you having pain? Yes: NPRS scale:  3/10 Pain location: R shoulder Pain description: dull ache, radiating  Aggravating factors: hanging her shoulder,  Relieving factors: supporting arm  Is a constant dull ache, wants to have her arm support Worst: 8/10 Least: 2/10    TODAY'S TREATMENT:  AROM Right Left  Shoulder Flexion 41 *  120  Shoulder Abduction 72* 125  ER 2 * WFL  IR 8* WFL  Elbow flexion  40* WFL  Elbow extension [facility] WFL   painful  MMT:  Shoulder: L: 5/5 R: unable to test due to unable to reach testing position  Wrist:  L 5/5 R: flexion: 3+/5 extension 3/5 Hand: L 5/5 R: grip 3/5, abduction 2+  Special tests: Unable to perform special tests due to pain and limited mobility  Median nerve glide: unable to fully perform modification due to cervical pain and neck pain.  Distraction reduced pain in supine position   Mobilizations: AP glenohumeral joint: increase pain and radiating symptoms; hypomobile painful PA glenohumeral joint: hypomobile painful Inferior glide glenohumeral joint: hypomobile painful  Clavicle:  -painful to all palpation and mobilization.   Scapula: -hypomobile   Palpation: Tender to touch to glenohumeral joint, clavicle, neck, scapula   Education on use of putty for grip strength: Medium strength putty given.   Outcome measures:  QuickDash: 79.5%  PATIENT EDUCATION: Education details: goals, POC  Person educated: Patient Education method: Explanation, Demonstration, Tactile cues, and Verbal cues Education comprehension: verbalized understanding, returned demonstration, verbal cues required, and tactile cues required   HOME EXERCISE PROGRAM: Theraputty for grip strength    PT Short Term Goals       PT SHORT TERM GOAL #1   Title Patient will be independent in home exercise program to improve strength/mobility for better functional independence with ADLs.    Baseline 5/15: initiated    Time 6    Period Weeks    Status New    Target Date 11/30/21    PT  SHORT TERM GOAL #2  Title Patient will report a worst pain of 5/10 on VAS in  R shoulder to improve tolerance with ADLs and reduced symptoms with activities  Baseline 6/22: 8/10  Time 6   Period Weeks   Status New   Target Date 11/30/21             PT Long Term Goals       PT LONG TERM GOAL #1   Title Patient will increase FOTO score to equal to or greater than 53 to demonstrate statistically significant improvement in mobility and quality of life.    Baseline 5/15: 44    Time 12    Period Weeks    Status New    Target Date 01/10/22  PT LONG TERM GOAL #2   Title Patient will report a worst headache pain of 3/10 to improve tolerance with ADLs and reduced symptoms with activities.    Baseline 5/15: worst pain 8/10 (with medication)    Time 12    Period Weeks    Status New    Target Date 01/10/22      PT LONG TERM GOAL #3   Title Patient will increase UE gross strength to 4+/5 as to improve functional strength for increased ADL ability and ability to perform work duties.    Baseline 5/15: Grossly 4/5 BUEs and pain limited    Time 12    Period Weeks    Status New    Target Date 01/10/22      PT LONG TERM GOAL #4   Title The pt will show at least a 10 degree improvement with cervical L lateral flexion, extension, flexion, and bilateral rotatoin.    Baseline 5/16: Ext. 15 deg.   Flex. 23 deg.   Lateral flex: 20 deg. L  Rotation: 20-23 deg Bilat.    Time 12    Period Weeks    Status New    Target Date 01/10/22    PT LONG TERM GOAL #5  Title Patient will report a worst pain of 3/10 on VAS in R shoulder to improve tolerance with ADLs and reduced symptoms with activities.   Baseline 6/22: 8/10  Time 12   Period Weeks   Status New   Target Date 01/10/22     PT LONG TERM GOAL #6  Title Patient will demonstrate adequate shoulder ROM and strength to be able to shave and dress independently with pain less than 3/10.  Baseline 6/22: flexion 41, abduction 72; unable to  test strength  Time 12   Period Weeks   Status New   Target Date 01/10/22     PT LONG TERM GOAL #7  Title  Patient will decrease Quick DASH score by > 8 points (71.5%)demonstrating reduced self-reported upper extremity disability.  Baseline 6/22: 79.5%  Time 12   Period Weeks   Status New   Target Date 01/10/22             Plan     Clinical Impression Statement Patient presents with new order to add to current POC. Patient's new order is for Traumatic incomplete tear of right rotator cuff. Patient does have history of surgery and per patient report is to attempt another round of therapy prior to potentially requiring additional surgery. Patient has severely limited ROM and is unable to tolerate testing positions to further assess patient's anatomical integrity of structures. Patient has high pain and is unable to perform muscle testing this session. Her grip is additionally significantly impacted. Patient will benefit from skilled physical therapy to decrease pain, improve ROM, and improve quality of life.    Personal Factors and Comorbidities Comorbidity 3+;Sex;Time since onset of injury/illness/exacerbation    Comorbidities PMH: anxiety (claustrophobic), back pain, asthma, biceps tendonosis of R shoulder, DM, dyspnea, L frozen shouler (steroid injection 10/05/21), hx of concussion, headache disorder, HTN, HTN retinopathy, IBS, joint pain, leg edema, palpitation, R shoulder impingement syndrome, vitamin D difictiency, uveitis, paroxysmal supraventricular tachycardia, shoulder arthroscopy with subacromial decompression and mini open RTC repair, open bicep tenodesis    Examination-Activity Limitations Locomotion Level;Caring for Others;Other;Reach Overhead;Carry   with increased pain pt reports must rest, limits ability to perform job duties/ADLs   Examination-Participation Restrictions Occupation;Community Activity;Shop;Driving;Cleaning;Yard Work;Other;Laundry;Meal Prep   stiffness of cervical  spine limits ability to look over shoulder with driving   Stability/Clinical Decision Making Evolving/Moderate complexity    Rehab Potential Good    PT Frequency 2x / week    PT Duration 12 weeks    PT Treatment/Interventions ADLs/Self Care Home Management;Biofeedback;Canalith Repostioning;Cryotherapy;Electrical Stimulation;Moist Heat;Traction;Ultrasound;DME Instruction;Gait training;Stair training;Functional mobility training;Therapeutic exercise;Balance training;Neuromuscular re-education;Patient/family education;Orthotic Fit/Training;Therapeutic activities;Manual techniques;Scar mobilization;Manual lymph drainage;Compression bandaging;Passive range of motion;Dry needling;Energy conservation;Splinting;Taping;Vestibular;Spinal Manipulations;Joint Manipulations;Iontophoresis '4mg'$ /ml Dexamethasone    PT Next Visit Plan E stim, ROM, isometrics    PT Home Exercise Plan Access Code: [email or web address] Ulnar nerve glide added to HEP to be performed daily    Consulted and Agree with Plan of Care Patient            Janna Arch, PT, DPT  11/25/2021, 7:32 PM

## 2021-11-29 ENCOUNTER — Ambulatory Visit: Payer: 59

## 2021-11-29 DIAGNOSIS — H40003 Preglaucoma, unspecified, bilateral: Secondary | ICD-10-CM | POA: Diagnosis not present

## 2021-11-30 ENCOUNTER — Ambulatory Visit: Payer: 59

## 2021-11-30 DIAGNOSIS — G8929 Other chronic pain: Secondary | ICD-10-CM

## 2021-11-30 DIAGNOSIS — M542 Cervicalgia: Secondary | ICD-10-CM

## 2021-11-30 DIAGNOSIS — M25511 Pain in right shoulder: Secondary | ICD-10-CM | POA: Diagnosis not present

## 2021-11-30 DIAGNOSIS — M6281 Muscle weakness (generalized): Secondary | ICD-10-CM | POA: Diagnosis not present

## 2021-11-30 DIAGNOSIS — M25611 Stiffness of right shoulder, not elsewhere classified: Secondary | ICD-10-CM | POA: Diagnosis not present

## 2021-12-01 ENCOUNTER — Ambulatory Visit: Payer: 59 | Admitting: Physical Therapy

## 2021-12-01 DIAGNOSIS — M6281 Muscle weakness (generalized): Secondary | ICD-10-CM

## 2021-12-01 DIAGNOSIS — M25611 Stiffness of right shoulder, not elsewhere classified: Secondary | ICD-10-CM | POA: Diagnosis not present

## 2021-12-01 DIAGNOSIS — M542 Cervicalgia: Secondary | ICD-10-CM | POA: Diagnosis not present

## 2021-12-01 DIAGNOSIS — G8929 Other chronic pain: Secondary | ICD-10-CM

## 2021-12-01 DIAGNOSIS — M25511 Pain in right shoulder: Secondary | ICD-10-CM | POA: Diagnosis not present

## 2021-12-01 NOTE — Therapy (Signed)
OUTPATIENT PHYSICAL THERAPY TREATMENT NOTE   Patient Name: [name] MRN: [id number] DOB:1970-08-18, 51 y.o., female Today's Date: 12/01/2021  PCP: Crecencio Mc MD  REFERRING PROVIDER: Vanetta Mulders MD   PT End of Session - 12/01/21 1723     Visit Number 6    Number of Visits 25    Date for PT Re-Evaluation 01/10/22    Authorization Time Period 10/18/21-01/10/22    PT Start Time 0258    PT Stop Time 5277    PT Time Calculation (min) 41 min    Activity Tolerance Patient limited by pain    Behavior During Therapy [facility] for tasks assessed/performed               Past Medical History:  Diagnosis Date   Allergy    Anemia    Anxiety    claustrophobic   Asthma    Back pain    Biceps tendonosis of right shoulder    Cataract    Mixed OU   COVID-19    covid PNA hospitalized 2020, 06/06/21   Diabetes mellitus 2011   did not start metforfin, losing weight   Dyspnea    Fatty liver    Food allergy    Frozen shoulder    left, had steroid injection 10/05/21   Headache disorder    History of concussion    HLD (hyperlipidemia)    Hypertension    Hypertensive retinopathy    OU   IBS (irritable bowel syndrome)    Infertility, female    Joint pain    Lactose intolerance    Leg edema    Migraines    Palpitation    Post-menopausal    Seborrheic dermatitis    Shoulder impingement syndrome, right    Uveitis    Loving eye est in 2020 seen specialist in [place] [facility] last seen 11 or 05/2020   Vitamin D deficiency    Past Surgical History:  Procedure Laterality Date   ABDOMINAL HYSTERECTOMY  2006   heavy menses, endometriosis, l oophrectomy   BACK SURGERY     BREAST BIOPSY Right 2018   benign   BREAST EXCISIONAL BIOPSY Right 2003   benign   BREAST EXCISIONAL BIOPSY Right 1999   benign   BREAST SURGERY     right breast x 2 , benign   COLONOSCOPY  10/06/2020   HERNIA REPAIR  2003   left inguinal    LEFT OOPHORECTOMY     SHOULDER ARTHROSCOPY WITH  SUBACROMIAL DECOMPRESSION AND OPEN ROTATOR C Right 07/14/2016   Procedure: RIGHT SHOULDER ARTHROSCOPY WITH SUBACROMIAL DECOMPRESSION, DISTAL CLAVICLE RESECTION AND MINI OPEN ROTATOR CUFF REPAIR, OPEN BICEP TENDODESIS;  Surgeon: Garald Balding, MD;  Location: [place];  Service: Orthopedics;  Laterality: Right;   SHOULDER CLOSED REDUCTION Right 09/08/2016   Procedure: RIGHT CLOSED MANIPULATION SHOULDER;  Surgeon: Garald Balding, MD;  Location: [place];  Service: Orthopedics;  Laterality: Right;   TUBAL LIGATION     UPPER GASTROINTESTINAL ENDOSCOPY  10/06/2020   Patient Active Problem List   Diagnosis Date Noted   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 09/21/2021   Chronic left shoulder pain 09/21/2021   Sinus tachycardia 06/23/2021   Fatty infiltration of liver 07/26/2020   Diarrhea, functional 06/22/2020   Uveitis, intermediate, bilateral 09/07/2019   Generalized edema 06/13/2019   Abnormal electrocardiogram (ECG) (EKG) 06/07/2019   Hospital discharge follow-up 06/07/2019   Postviral fatigue syndrome 05/30/2019  Multinodular goiter 12/05/2017   Multiple thyroid nodules 12/05/2017   Microalbuminuria due to type 2 diabetes mellitus ([place]) 11/26/2017   Encounter for preventive health examination 11/26/2017   Menopause syndrome 06/10/2017   Adverse reaction to vaccine, sequela 03/25/2017   Palpitations 02/27/2017   PAC (premature atrial contraction) 02/27/2017   PVC's (premature ventricular contractions) 02/27/2017   PSVT (paroxysmal supraventricular tachycardia) ([place]) 11/26/2016   Nontraumatic incomplete tear of right rotator cuff 07/14/2016   AC (acromioclavicular) arthritis 07/14/2016   Impingement syndrome of right shoulder 07/14/2016   Fibrocystic breast changes, right 05/21/2016   Menopausal symptoms 10/30/2015   Insomnia 10/16/2015   Snoring 07/26/2015   Chronic migraine w/o aura w/o status migrainosus, not  intractable 02/24/2015   History of concussion 07/21/2014   Hyperlipidemia associated with type 2 diabetes mellitus ([place]) 06/13/2014   Vitamin D deficiency 05/07/2013   Chronic migraine 11/20/2012   S/P Total Abdominal Hysterectomy and Left Salpingo-oophorectomy 10/20/2011   Headache around the eyes    Uncontrolled type 2 diabetes mellitus 07/20/2011   Obesity (BMI 30-39.9) 07/20/2011   Essential hypertension 07/20/2011    REFERRING DIAG: traumatic incomplete tear of R rotator cuff/migraine/cervicalgia   THERAPY DIAG:  Muscle weakness (generalized)  Chronic right shoulder pain  Stiffness of right shoulder, not elsewhere classified  Rationale for Evaluation and Treatment Rehabilitation  PERTINENT HISTORY: Pt is a pleasant 51 y/o female presenting to PT due to chronic migraine headaches with cervical and bilateral shoulder pain. Pt has headaches every day. Pt reports onset of migraines at 11-44 years old where she had to seek emergency care due to severity of migraines. Pt reports COVID19 illness in 2020 and that migraines have been different since, including n/t that can occur on one side of her face (pt being followed by neurologist). Pt reports she has tried various medications, muscle relaxers to control migraines and related pain but that nothing has worked well yet. Other symptoms with her migraines include motion sickness, nausea, spinning sensation with her eyes closed, light and noise sensitivity, heightened sensitivity to touch on her face and scalp. 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 Pt with current R shoulder pain (reports has "possibly re-torn" R RTC), chronic R restrictions since 2020-2021 of no lifting greater than 10-15 lbs, no reaching behind, no reaching above her head. Hx of R RTC  repair 2018 per chart with dx of R shoulder adhesive capsulitis. Pt currently being treated for adhesive capsulitis of L shoulder..  Per chart other PMH significant for PMH: anxiety (claustrophobic), back pain, asthma, biceps tendonosis of R shoulder, DM, dyspnea, L frozen shouler (steroid injection 10/05/21), hx of concussion, headache disorder, HTN, HTN retinopathy, IBS, joint pain, leg edema, palpitation, R shoulder impingement syndrome, vitamin D deficiency, uveitis, paroxysmal supraventricular tachycardia, shoulder arthroscopy with subacromial decompression and mini open RTC repair, open bicep tenodesis  PRECAUTIONS: R shoulder; permanent restrictions. Do not lift more than 15lb.  Unless otherwise stated all objective data taken from initial evaluation for shoulder  SUBJECTIVE:  Patient reports feeling sore after evaluation. Coming from work and heading to second job after PT session.   PAIN:  Are you having pain? Yes: NPRS scale: 4/10 Pain location: R shoulder Pain description: dull ache, radiating  Aggravating factors: hanging her shoulder,  Relieving factors: supporting arm  Is a  constant dull and constant ache bu today has occasional spasms resulting in sharp pain   TODAY'S TREATMENT:   11/30/21: Manual: Distraction to R Gh joint 6x 15 seconds - caused spasm in R UE biceps area Stm and ischemic TP release in supraspinatus, multiple trigger points noted.  Trigger point with movement to R bicep x2 minutes -pain noted with this activity Increasing ER/IR PROM 10x 10 second holds- spasms and pain noted with this activity    TherEx Isometric contraction into PT hand: cueing for light muscle activation technique: flexion, abduction, extension, adduction 10 x 3-5 second holds Seated UE ranger  L hand on top of right:  -flexion/extension 10x -abduction/cross body adduction 10x -clockwise circle 8x, counter clockwise 8x   Ice provided to involved are for 6 min following therapy to  prevent onset of significant muscle soreness.     AROM 11/25/21 Right 11/25/21 Left 11/25/21  Shoulder Flexion 41 *  120  Shoulder Abduction 72* 125  ER 2 * WFL  IR 8* WFL  Elbow flexion  40* WFL  Elbow extension [facility] WFL   painful      PATIENT EDUCATION: Education details: goals, POC  Person educated: Patient Education method: Explanation, Demonstration, Tactile cues, and Verbal cues Education comprehension: verbalized understanding, returned demonstration, verbal cues required, and tactile cues required   HOME EXERCISE PROGRAM: Theraputty for grip strength    PT Short Term Goals       PT SHORT TERM GOAL #1   Title Patient will be independent in home exercise program to improve strength/mobility for better functional independence with ADLs.    Baseline 5/15: initiated    Time 6    Period Weeks    Status New    Target Date 11/30/21    PT SHORT TERM GOAL #2  Title Patient will report a worst pain of 5/10 on VAS in  R shoulder to improve tolerance with ADLs and reduced symptoms with activities  Baseline 6/22: 8/10  Time 6   Period Weeks   Status New   Target Date 11/30/21             PT Long Term Goals       PT LONG TERM GOAL #1   Title Patient will increase FOTO score to equal to or greater than 53 to demonstrate statistically significant improvement in mobility and quality of life.    Baseline 5/15: 44    Time 12    Period Weeks    Status New    Target Date 01/10/22      PT LONG TERM GOAL #2   Title Patient will report a worst headache pain of 3/10 to improve tolerance with ADLs and reduced symptoms with activities.    Baseline 5/15: worst pain 8/10 (with medication)    Time 12    Period Weeks    Status New    Target Date 01/10/22      PT LONG TERM GOAL #3   Title Patient will increase UE gross strength to 4+/5 as to improve functional strength for increased ADL ability and ability to perform work duties.    Baseline 5/15: Grossly 4/5 BUEs and pain  limited    Time 12    Period Weeks    Status New    Target Date 01/10/22      PT LONG TERM GOAL #4   Title The pt will show at least a 10 degree improvement with cervical L lateral flexion, extension, flexion, and bilateral rotatoin.  Baseline 5/16: Ext. 15 deg.   Flex. 23 deg.   Lateral flex: 20 deg. L  Rotation: 20-23 deg Bilat.    Time 12    Period Weeks    Status New    Target Date 01/10/22    PT LONG TERM GOAL #5  Title Patient will report a worst pain of 3/10 on VAS in R shoulder to improve tolerance with ADLs and reduced symptoms with activities.   Baseline 6/22: 8/10  Time 12   Period Weeks   Status New   Target Date 01/10/22     PT LONG TERM GOAL #6  Title Patient will demonstrate adequate shoulder ROM and strength to be able to shave and dress independently with pain less than 3/10.  Baseline 6/22: flexion 41, abduction 72; unable to test strength  Time 12   Period Weeks   Status New   Target Date 01/10/22     PT LONG TERM GOAL #7  Title  Patient will decrease Quick DASH score by > 8 points (71.5%)demonstrating reduced self-reported upper extremity disability.  Baseline 6/22: 79.5%  Time 12   Period Weeks   Status New   Target Date 01/10/22             Plan     Clinical Impression Statement Continued with current plan of care as laid out in evaluation and recent prior session. Pt remains motivated to advance progress toward goals. Pt requires high level assistance and cuing for completion of exercises in order to provide adequate level of stimulation challenge while minimizing pain and discomfort when possible. Pt closely monitored throughout session pt response and to maximize patient safety during interventions.  Patient continues to have high levels of pain when performing physical therapy activities and reports eases muscle spasms and significant soreness.  Patient requires frequent cues for relaxation of shoulder musculature to prevent onset of spasms and  muscle tension with stretching exercises performed in physical therapy.  Pt continues to demonstrate progress toward goals AEB progression of interventions this date either in volume or intensity.    Personal Factors and Comorbidities Comorbidity 3+;Sex;Time since onset of injury/illness/exacerbation    Comorbidities PMH: anxiety (claustrophobic), back pain, asthma, biceps tendonosis of R shoulder, DM, dyspnea, L frozen shouler (steroid injection 10/05/21), hx of concussion, headache disorder, HTN, HTN retinopathy, IBS, joint pain, leg edema, palpitation, R shoulder impingement syndrome, vitamin D difictiency, uveitis, paroxysmal supraventricular tachycardia, shoulder arthroscopy with subacromial decompression and mini open RTC repair, open bicep tenodesis    Examination-Activity Limitations Locomotion Level;Caring for Others;Other;Reach Overhead;Carry   with increased pain pt reports must rest, limits ability to perform job duties/ADLs   Examination-Participation Restrictions Occupation;Community Activity;Shop;Driving;Cleaning;Yard Work;Other;Laundry;Meal Prep   stiffness of cervical spine limits ability to look over shoulder with driving   Stability/Clinical Decision Making Evolving/Moderate complexity    Rehab Potential Good    PT Frequency 2x / week    PT Duration 12 weeks    PT Treatment/Interventions ADLs/Self Care Home Management;Biofeedback;Canalith Repostioning;Cryotherapy;Electrical Stimulation;Moist Heat;Traction;Ultrasound;DME Instruction;Gait training;Stair training;Functional mobility training;Therapeutic exercise;Balance training;Neuromuscular re-education;Patient/family education;Orthotic Fit/Training;Therapeutic activities;Manual techniques;Scar mobilization;Manual lymph drainage;Compression bandaging;Passive range of motion;Dry needling;Energy conservation;Splinting;Taping;Vestibular;Spinal Manipulations;Joint Manipulations;Iontophoresis '4mg'$ /ml Dexamethasone    PT Next Visit Plan E stim,  ROM, isometrics    PT Home Exercise Plan Access Code: [email or web address] Ulnar nerve glide added to HEP to be performed daily    Consulted and Agree with Plan of Care Patient            Rivka Barbara PT, DPT  12/01/2021, 5:24 PM

## 2021-12-06 ENCOUNTER — Ambulatory Visit: Payer: 59

## 2021-12-08 ENCOUNTER — Ambulatory Visit: Payer: 59 | Attending: Psychiatry

## 2021-12-08 DIAGNOSIS — M25611 Stiffness of right shoulder, not elsewhere classified: Secondary | ICD-10-CM | POA: Insufficient documentation

## 2021-12-08 DIAGNOSIS — M6281 Muscle weakness (generalized): Secondary | ICD-10-CM | POA: Insufficient documentation

## 2021-12-08 DIAGNOSIS — G8929 Other chronic pain: Secondary | ICD-10-CM | POA: Diagnosis not present

## 2021-12-08 DIAGNOSIS — M25511 Pain in right shoulder: Secondary | ICD-10-CM | POA: Diagnosis not present

## 2021-12-08 DIAGNOSIS — M542 Cervicalgia: Secondary | ICD-10-CM | POA: Diagnosis not present

## 2021-12-08 NOTE — Therapy (Signed)
OUTPATIENT PHYSICAL THERAPY TREATMENT NOTE   Patient Name: [name] MRN: [id number] DOB:[birth year], 51 y.o., female Today's Date: 12/09/2021  PCP: Sherlene Shams MD  REFERRING PROVIDER: Huel Cote MD   PT End of Session - 12/09/21 1045     Visit Number 7    Number of Visits 25    Date for PT Re-Evaluation 01/10/22    Authorization Time Period 10/18/21-01/10/22    PT Start Time 1604    PT Stop Time 1645    PT Time Calculation (min) 41 min    Activity Tolerance Patient limited by pain    Behavior During Therapy [facility] for tasks assessed/performed                Past Medical History:  Diagnosis Date   Allergy    Anemia    Anxiety    claustrophobic   Asthma    Back pain    Biceps tendonosis of right shoulder    Cataract    Mixed OU   COVID-19    covid PNA hospitalized 2020, 06/06/21   Diabetes mellitus 2011   did not start metforfin, losing weight   Dyspnea    Fatty liver    Food allergy    Frozen shoulder    left, had steroid injection 10/05/21   Headache disorder    History of concussion    HLD (hyperlipidemia)    Hypertension    Hypertensive retinopathy    OU   IBS (irritable bowel syndrome)    Infertility, female    Joint pain    Lactose intolerance    Leg edema    Migraines    Palpitation    Post-menopausal    Seborrheic dermatitis    Shoulder impingement syndrome, right    Uveitis    [place] [facility] est in 2020 seen specialist in [place] [facility] last seen 11 or 05/2020   Vitamin D deficiency    Past Surgical History:  Procedure Laterality Date   ABDOMINAL HYSTERECTOMY  2006   heavy menses, endometriosis, l oophrectomy   BACK SURGERY     BREAST BIOPSY Right 2018   benign   BREAST EXCISIONAL BIOPSY Right 2003   benign   BREAST EXCISIONAL BIOPSY Right 1999   benign   BREAST SURGERY     right breast x 2 , benign   COLONOSCOPY  10/06/2020   HERNIA REPAIR  2003   left inguinal    LEFT OOPHORECTOMY     SHOULDER ARTHROSCOPY WITH  SUBACROMIAL DECOMPRESSION AND OPEN ROTATOR C Right 07/14/2016   Procedure: RIGHT SHOULDER ARTHROSCOPY WITH SUBACROMIAL DECOMPRESSION, DISTAL CLAVICLE RESECTION AND MINI OPEN ROTATOR CUFF REPAIR, OPEN BICEP TENDODESIS;  Surgeon: Valeria Batman, MD;  Location: [facility];  Service: Orthopedics;  Laterality: Right;   SHOULDER CLOSED REDUCTION Right 09/08/2016   Procedure: RIGHT CLOSED MANIPULATION SHOULDER;  Surgeon: Valeria Batman, MD;  Location: [facility];  Service: Orthopedics;  Laterality: Right;   TUBAL LIGATION     UPPER GASTROINTESTINAL ENDOSCOPY  10/06/2020   Patient Active Problem List   Diagnosis Date Noted   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 09/21/2021   Chronic left shoulder pain 09/21/2021   Sinus tachycardia 06/23/2021   Fatty infiltration of liver 07/26/2020   Diarrhea, functional 06/22/2020   Uveitis, intermediate, bilateral 09/07/2019   Generalized edema 06/13/2019   Abnormal electrocardiogram (ECG) (EKG) 06/07/2019   Hospital discharge follow-up 06/07/2019   Postviral fatigue syndrome 05/30/2019  Multinodular goiter 12/05/2017   Multiple thyroid nodules 12/05/2017   Microalbuminuria due to type 2 diabetes mellitus (HCC) 11/26/2017   Encounter for preventive health examination 11/26/2017   Menopause syndrome 06/10/2017   Adverse reaction to vaccine, sequela 03/25/2017   Palpitations 02/27/2017   PAC (premature atrial contraction) 02/27/2017   PVC's (premature ventricular contractions) 02/27/2017   PSVT (paroxysmal supraventricular tachycardia) (HCC) 11/26/2016   Nontraumatic incomplete tear of right rotator cuff 07/14/2016   AC (acromioclavicular) arthritis 07/14/2016   Impingement syndrome of right shoulder 07/14/2016   Fibrocystic breast changes, right 05/21/2016   Menopausal symptoms 10/30/2015   Insomnia 10/16/2015   Snoring 07/26/2015   Chronic migraine w/o aura w/o status migrainosus, not  intractable 02/24/2015   History of concussion 07/21/2014   Hyperlipidemia associated with type 2 diabetes mellitus (HCC) 06/13/2014   Vitamin D deficiency 05/07/2013   Chronic migraine 11/20/2012   S/P Total Abdominal Hysterectomy and Left Salpingo-oophorectomy 10/20/2011   Headache around the eyes    Uncontrolled type 2 diabetes mellitus 07/20/2011   Obesity (BMI 30-39.9) 07/20/2011   Essential hypertension 07/20/2011    REFERRING DIAG: traumatic incomplete tear of R rotator cuff/migraine/cervicalgia   THERAPY DIAG:  Cervicalgia  Chronic right shoulder pain  Stiffness of right shoulder, not elsewhere classified  Muscle weakness (generalized)  Rationale for Evaluation and Treatment Rehabilitation  PERTINENT HISTORY: Pt is a pleasant 51 y/o female presenting to PT due to chronic migraine headaches with cervical and bilateral shoulder pain. Pt has headaches every day. Pt reports onset of migraines at [age] years old where she had to seek emergency care due to severity of migraines. Pt reports COVID19 illness in 2020 and that migraines have been different since, including n/t that can occur on one side of her face (pt being followed by neurologist). Pt reports she has tried various medications, muscle relaxers to control migraines and related pain but that nothing has worked well yet. Other symptoms with her migraines include motion sickness, nausea, spinning sensation with her eyes closed, light and noise sensitivity, heightened sensitivity to touch on her face and scalp. 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 Pt with current R shoulder pain (reports has "possibly re-torn" R RTC), chronic R restrictions since 2020-2021 of no lifting greater than 10-15 lbs, no reaching behind, no reaching above her head.  Hx of R RTC repair 2018 per chart with dx of R shoulder adhesive capsulitis. Pt currently being treated for adhesive capsulitis of L shoulder..  Per chart other PMH significant for PMH: anxiety (claustrophobic), back pain, asthma, biceps tendonosis of R shoulder, DM, dyspnea, L frozen shouler (steroid injection 10/05/21), hx of concussion, headache disorder, HTN, HTN retinopathy, IBS, joint pain, leg edema, palpitation, R shoulder impingement syndrome, vitamin D deficiency, uveitis, paroxysmal supraventricular tachycardia, shoulder arthroscopy with subacromial decompression and mini open RTC repair, open bicep tenodesis  PRECAUTIONS: R shoulder; permanent restrictions. Do not lift more than 15lb.  Unless otherwise stated all objective data taken from initial evaluation for shoulder    AROM 11/25/21 Right 11/25/21 Left 11/25/21  Shoulder Flexion 41 *  120  Shoulder Abduction 72* 125  ER 2 * WFL  IR 8* WFL  Elbow flexion  40* WFL  Elbow extension WFL WFL   painful   SUBJECTIVE:  Pt went to the beach  and had a good time. Pt has only had a couple Migraines after trigger point injections in June. She reports it initially helped but pain starting to come back. She plans to make another appointment for further trigger point injections. Pt repots no headache currently. Last headache was yesterday.  RUE is currently painful.  PAIN:  Are you having pain? Yes: NPRS scale: 3/10 Pain location: R shoulder Pain description: dull ache, radiating  Aggravating factors: hanging her shoulder,  Relieving factors: supporting arm   TODAY'S TREATMENT:   12/08/2021  The following exercises were performed with patient supine on plinth (bolster supporting BLEs), towel role under RUE, and elastogel donned to R posterior shoulder and along R tricep. Pt reports ice/Elastogel feels good to shoulder/tricep.  STM and gentle TrP release along R bicep and anterior deltoid x 5 min total    RUE: AAROM elbow flexion and  extension within pain-free range (pt significantly limited in ROM due to pain). AAROM R shoulder flexion 2x10 within pain-free range (approx 20-25 deg) AROM IR 10x pain free Shoulder ER AAROM with PVC and PT supporting RUE 2x10  AAROM PVC chest press within significantly decreased ROM 2x10. TC throughout and gentle pressure applied to R supraspinatus improves comfort with intervention.  Isometrics: elbow flexion and extension through pain-free range and modified for comfortable positioning 10x5 sec hold for each Isometrics: shoulder ER/IR 10x with 5 sec hold for each within pain-free/pain limited range Isometric shoulder ext into plinth 10x with 5 sec hold  Comments: cuing for finger flicks throughout interventions to assist with n/t sensation (chronic). Pt also cued to perform motions slowly to increase comfort with interventions. Pt reports improved comfort following cuing.    Seated: Shoulder circles CW/CC 10x for each Scapular squeezes 10x    PATIENT EDUCATION: Education details: exercise technique, modifications  Person educated: Patient Education method: Explanation, Demonstration, Tactile cues, and Verbal cues Education comprehension: verbalized understanding, returned demonstration, verbal cues required, and tactile cues required   HOME EXERCISE PROGRAM: no updates on this date 12/08/2021 Theraputty for grip strength    PT Short Term Goals       PT SHORT TERM GOAL #1   Title Patient will be independent in home exercise program to improve strength/mobility for better functional independence with ADLs.    Baseline 5/15: initiated    Time 6    Period Weeks    Status New    Target Date 11/30/21    PT SHORT TERM GOAL #2  Title Patient will report a worst pain of 5/10 on VAS in  R shoulder to improve tolerance with ADLs and reduced symptoms with activities  Baseline 6/22: 8/10  Time 6   Period Weeks   Status New   Target Date 11/30/21             PT Long Term Goals        PT LONG TERM GOAL #1   Title Patient will increase FOTO score to equal to or greater than 53 to demonstrate statistically significant improvement in mobility and quality of life.    Baseline 5/15: 44    Time 12    Period Weeks    Status New    Target Date 01/10/22      PT LONG TERM GOAL #2   Title Patient will report a worst headache pain of 3/10 to improve tolerance with ADLs and reduced symptoms with activities.    Baseline 5/15: worst pain 8/10 (with medication)    Time 12  Period Weeks    Status New    Target Date 01/10/22      PT LONG TERM GOAL #3   Title Patient will increase UE gross strength to 4+/5 as to improve functional strength for increased ADL ability and ability to perform work duties.    Baseline 5/15: Grossly 4/5 BUEs and pain limited    Time 12    Period Weeks    Status New    Target Date 01/10/22      PT LONG TERM GOAL #4   Title The pt will show at least a 10 degree improvement with cervical L lateral flexion, extension, flexion, and bilateral rotatoin.    Baseline 5/16: Ext. 15 deg.   Flex. 23 deg.   Lateral flex: 20 deg. L  Rotation: 20-23 deg Bilat.    Time 12    Period Weeks    Status New    Target Date 01/10/22    PT LONG TERM GOAL #5  Title Patient will report a worst pain of 3/10 on VAS in R shoulder to improve tolerance with ADLs and reduced symptoms with activities.   Baseline 6/22: 8/10  Time 12   Period Weeks   Status New   Target Date 01/10/22     PT LONG TERM GOAL #6  Title Patient will demonstrate adequate shoulder ROM and strength to be able to shave and dress independently with pain less than 3/10.  Baseline 6/22: flexion 41, abduction 72; unable to test strength  Time 12   Period Weeks   Status New   Target Date 01/10/22     PT LONG TERM GOAL #7  Title  Patient will decrease Quick DASH score by > 8 points (71.5%)demonstrating reduced self-reported upper extremity disability.  Baseline 6/22: 79.5%  Time 12   Period  Weeks   Status New   Target Date 01/10/22             Plan     Clinical Impression Statement PT continues current plan of care as laid out in evaluation and prior session. PT provided education throughout with interventions regarding how to modify positioning and intensity to improve comfort with exercises. Pt able to find pain-free positioning and range with majority of interventions, however, ranges are still significantly limited at this time. The pt will benefit from further skilled PT to improve pain, ROM and strength to increase QOL and ease with ADLs.      Personal Factors and Comorbidities Comorbidity 3+;Sex;Time since onset of injury/illness/exacerbation    Comorbidities PMH: anxiety (claustrophobic), back pain, asthma, biceps tendonosis of R shoulder, DM, dyspnea, L frozen shouler (steroid injection 10/05/21), hx of concussion, headache disorder, HTN, HTN retinopathy, IBS, joint pain, leg edema, palpitation, R shoulder impingement syndrome, vitamin D difictiency, uveitis, paroxysmal supraventricular tachycardia, shoulder arthroscopy with subacromial decompression and mini open RTC repair, open bicep tenodesis    Examination-Activity Limitations Locomotion Level;Caring for Others;Other;Reach Overhead;Carry   with increased pain pt reports must rest, limits ability to perform job duties/ADLs   Examination-Participation Restrictions Occupation;Community Activity;Shop;Driving;Cleaning;Yard Work;Other;Laundry;Meal Prep   stiffness of cervical spine limits ability to look over shoulder with driving   Stability/Clinical Decision Making Evolving/Moderate complexity    Rehab Potential Good    PT Frequency 2x / week    PT Duration 12 weeks    PT Treatment/Interventions ADLs/Self Care Home Management;Biofeedback;Canalith Repostioning;Cryotherapy;Electrical Stimulation;Moist Heat;Traction;Ultrasound;DME Instruction;Gait training;Stair training;Functional mobility training;Therapeutic  exercise;Balance training;Neuromuscular re-education;Patient/family education;Orthotic Fit/Training;Therapeutic activities;Manual techniques;Scar mobilization;Manual lymph drainage;Compression bandaging;Passive range of motion;Dry needling;Energy  conservation;Splinting;Taping;Vestibular;Spinal Manipulations;Joint Manipulations;Iontophoresis 4mg /ml Dexamethasone    PT Next Visit Plan E stim, ROM, isometrics    PT Home Exercise Plan Access Code: [email or web address] Ulnar nerve glide added to HEP to be performed daily    Consulted and Agree with Plan of Care Patient            Temple Pacini PT, DPT    12/09/2021, 10:46 AM

## 2021-12-13 ENCOUNTER — Ambulatory Visit: Payer: 59

## 2021-12-15 ENCOUNTER — Ambulatory Visit: Payer: 59

## 2021-12-15 DIAGNOSIS — M542 Cervicalgia: Secondary | ICD-10-CM | POA: Diagnosis not present

## 2021-12-15 DIAGNOSIS — G8929 Other chronic pain: Secondary | ICD-10-CM

## 2021-12-15 DIAGNOSIS — M6281 Muscle weakness (generalized): Secondary | ICD-10-CM | POA: Diagnosis not present

## 2021-12-15 DIAGNOSIS — M25611 Stiffness of right shoulder, not elsewhere classified: Secondary | ICD-10-CM | POA: Diagnosis not present

## 2021-12-15 DIAGNOSIS — M25511 Pain in right shoulder: Secondary | ICD-10-CM | POA: Diagnosis not present

## 2021-12-15 NOTE — Therapy (Signed)
OUTPATIENT PHYSICAL THERAPY TREATMENT NOTE   Patient Name: [name] MRN: [id number] DOB:04-03-1971, 51 y.o., female Today's Date: 12/16/2021  PCP: Sherlene Shams MD  REFERRING PROVIDER: Huel Cote MD   PT End of Session - 12/16/21 0959     Visit Number 8    Number of Visits 25    Date for PT Re-Evaluation 01/10/22    Authorization Time Period 10/18/21-01/10/22    PT Start Time 1608    PT Stop Time 1652    PT Time Calculation (min) 44 min    Activity Tolerance Patient limited by pain    Behavior During Therapy [facility] for tasks assessed/performed                 Past Medical History:  Diagnosis Date   Allergy    Anemia    Anxiety    claustrophobic   Asthma    Back pain    Biceps tendonosis of right shoulder    Cataract    Mixed OU   COVID-19    covid PNA hospitalized 2020, 06/06/21   Diabetes mellitus 2011   did not start metforfin, losing weight   Dyspnea    Fatty liver    Food allergy    Frozen shoulder    left, had steroid injection 10/05/21   Headache disorder    History of concussion    HLD (hyperlipidemia)    Hypertension    Hypertensive retinopathy    OU   IBS (irritable bowel syndrome)    Infertility, female    Joint pain    Lactose intolerance    Leg edema    Migraines    Palpitation    Post-menopausal    Seborrheic dermatitis    Shoulder impingement syndrome, right    Uveitis    [place] eye est in 2020 seen specialist in [place] [facility] last seen 11 or 05/2020   Vitamin D deficiency    Past Surgical History:  Procedure Laterality Date   ABDOMINAL HYSTERECTOMY  2006   heavy menses, endometriosis, l oophrectomy   BACK SURGERY     BREAST BIOPSY Right 2018   benign   BREAST EXCISIONAL BIOPSY Right 2003   benign   BREAST EXCISIONAL BIOPSY Right 1999   benign   BREAST SURGERY     right breast x 2 , benign   COLONOSCOPY  10/06/2020   HERNIA REPAIR  2003   left inguinal    LEFT OOPHORECTOMY     SHOULDER ARTHROSCOPY WITH  SUBACROMIAL DECOMPRESSION AND OPEN ROTATOR C Right 07/14/2016   Procedure: RIGHT SHOULDER ARTHROSCOPY WITH SUBACROMIAL DECOMPRESSION, DISTAL CLAVICLE RESECTION AND MINI OPEN ROTATOR CUFF REPAIR, OPEN BICEP TENDODESIS;  Surgeon: Valeria Batman, MD;  Location: [facility];  Service: Orthopedics;  Laterality: Right;   SHOULDER CLOSED REDUCTION Right 09/08/2016   Procedure: RIGHT CLOSED MANIPULATION SHOULDER;  Surgeon: Valeria Batman, MD;  Location: [facility];  Service: Orthopedics;  Laterality: Right;   TUBAL LIGATION     UPPER GASTROINTESTINAL ENDOSCOPY  10/06/2020   Patient Active Problem List   Diagnosis Date Noted   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 09/21/2021   Chronic left shoulder pain 09/21/2021   Sinus tachycardia 06/23/2021   Fatty infiltration of liver 07/26/2020   Diarrhea, functional 06/22/2020   Uveitis, intermediate, bilateral 09/07/2019   Generalized edema 06/13/2019   Abnormal electrocardiogram (ECG) (EKG) 06/07/2019   Hospital discharge follow-up 06/07/2019   Postviral fatigue syndrome  05/30/2019   Multinodular goiter 12/05/2017   Multiple thyroid nodules 12/05/2017   Microalbuminuria due to type 2 diabetes mellitus (HCC) 11/26/2017   Encounter for preventive health examination 11/26/2017   Menopause syndrome 06/10/2017   Adverse reaction to vaccine, sequela 03/25/2017   Palpitations 02/27/2017   PAC (premature atrial contraction) 02/27/2017   PVC's (premature ventricular contractions) 02/27/2017   PSVT (paroxysmal supraventricular tachycardia) (HCC) 11/26/2016   Nontraumatic incomplete tear of right rotator cuff 07/14/2016   AC (acromioclavicular) arthritis 07/14/2016   Impingement syndrome of right shoulder 07/14/2016   Fibrocystic breast changes, right 05/21/2016   Menopausal symptoms 10/30/2015   Insomnia 10/16/2015   Snoring 07/26/2015   Chronic migraine w/o aura w/o status migrainosus, not  intractable 02/24/2015   History of concussion 07/21/2014   Hyperlipidemia associated with type 2 diabetes mellitus (HCC) 06/13/2014   Vitamin D deficiency 05/07/2013   Chronic migraine 11/20/2012   S/P Total Abdominal Hysterectomy and Left Salpingo-oophorectomy 10/20/2011   Headache around the eyes    Uncontrolled type 2 diabetes mellitus 07/20/2011   Obesity (BMI 30-39.9) 07/20/2011   Essential hypertension 07/20/2011    REFERRING DIAG: traumatic incomplete tear of R rotator cuff/migraine/cervicalgia   THERAPY DIAG:  Chronic right shoulder pain  Stiffness of right shoulder, not elsewhere classified  Cervicalgia  Muscle weakness (generalized)  Rationale for Evaluation and Treatment Rehabilitation  PERTINENT HISTORY: Pt is a pleasant 51 y/o female presenting to PT due to chronic migraine headaches with cervical and bilateral shoulder pain. Pt has headaches every day. Pt reports onset of migraines at 5-31 years old where she had to seek emergency care due to severity of migraines. Pt reports COVID19 illness in 2020 and that migraines have been different since, including n/t that can occur on one side of her face (pt being followed by neurologist). Pt reports she has tried various medications, muscle relaxers to control migraines and related pain but that nothing has worked well yet. Other symptoms with her migraines include motion sickness, nausea, spinning sensation with her eyes closed, light and noise sensitivity, heightened sensitivity to touch on her face and scalp. 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 Pt with current R shoulder pain (reports has "possibly re-torn" R RTC), chronic R restrictions since 2020-2021 of no lifting greater than 10-15 lbs, no reaching behind, no reaching above her head.  Hx of R RTC repair 2018 per chart with dx of R shoulder adhesive capsulitis. Pt currently being treated for adhesive capsulitis of L shoulder..  Per chart other PMH significant for PMH: anxiety (claustrophobic), back pain, asthma, biceps tendonosis of R shoulder, DM, dyspnea, L frozen shouler (steroid injection 10/05/21), hx of concussion, headache disorder, HTN, HTN retinopathy, IBS, joint pain, leg edema, palpitation, R shoulder impingement syndrome, vitamin D deficiency, uveitis, paroxysmal supraventricular tachycardia, shoulder arthroscopy with subacromial decompression and mini open RTC repair, open bicep tenodesis  PRECAUTIONS: R shoulder; permanent restrictions. Do not lift more than 15lb.  Unless otherwise stated, all objective data taken from initial evaluation for shoulder    AROM 11/25/21 Right 11/25/21 Left 11/25/21  Shoulder Flexion 41 *  120  Shoulder Abduction 72* 125  ER 2 * WFL  IR 8* WFL  Elbow flexion  40* WFL  Elbow extension [facility] WFL   painful   SUBJECTIVE: Pt reports she  has had a busy day. Pt reports shoulder feels similar to previous session (3/10 pain). She has a slight headache. Pt reports she realized she had not taken her migraine medication last week, restarted today. Pt drove over an hour yesterday and noticed her fingers felt numb in RUE.   PAIN:  Are you having pain? Yes: NPRS scale: 3/10 Pain location: R shoulder, headache Pain description: dull ache Aggravating factors: hanging her shoulder,driving  Relieving factors: supporting arm   TODAY'S TREATMENT:   12/15/2021  Therex- RUE ulnar nerve glide x multiple reps. Pt reports feels a stretch, feels good  Cervical rotation stretch 60 sec B  Cervical lateral flexion stretch 60 sec B Cervical ext 30 sec  Cervical flex 30 sec  Scapular clocks CW/CC 10x each B. Pt rates as difficult to perform. Noted trembling with movement/difficulty with fluidity of movement due to muscular stiffness/discomfort  The  following exercises were performed with patient supine on plinth (bolster supporting BLEs), towel role under RUE, and elastogel donned to R posterior shoulder and along R tricep. Pt continues to note that ice/Elastogel feels good to shoulder/tricep, eases symptoms.   Supine chin tucks 12x 3 sec holds, 10x 3 sec holds. VC/demo for technique. Noted to be pain-limited   RUE isometric elbow ext into manual resistance: 2x10 reps x 3 sec holds  RUE serratus punch 10x performed through decreased ROM to perform pain-limited/pain-free  Isometric shoulder ext. into plinth with towel roll at side 10x with 1-3 sec hold/rep. Pt describes radiating "lightning" like sensation down R arm with intervention. Cuing for finger flicks due to n/t (chronic).  Gentle AROM supine cervical rotation cued to perform through decreased ROM 10x B.   Isometric manually resisted R bicep curl 10x 3 sec holds (change in UE positioning to comfortable range of elbow flexion)  Dowel assisted R shoulder ER x multiple reps cued through decreased ROM for pain modulation  Manual- Seated STM to posterior R shoulder with focus on R UT and parascapular muscles of RUE x 6 min, gentle pressure within pain-free range. Elastogel applied to R shoulder during manual. Pt notes elastogel continues to feel good to shoulder. No adverse reaction to treatment.     PATIENT EDUCATION: Education details: exercise technique, how to modify exercise intensity/positioning to maximize comfort with interventions   Person educated: Patient Education method: Explanation, Demonstration, Tactile cues, and Verbal cues Education comprehension: verbalized understanding, returned demonstration, verbal cues required, and needs further education   HOME EXERCISE PROGRAM: no updates on this date 12/15/2021 Theraputty for grip strength    PT Short Term Goals       PT SHORT TERM GOAL #1   Title Patient will be independent in home exercise program to improve  strength/mobility for better functional independence with ADLs.    Baseline 5/15: initiated    Time 6    Period Weeks    Status New    Target Date 11/30/21    PT SHORT TERM GOAL #2  Title Patient will report a worst pain of 5/10 on VAS in  R shoulder to improve tolerance with ADLs and reduced symptoms with activities  Baseline 6/22: 8/10  Time 6   Period Weeks   Status New   Target Date 11/30/21             PT Long Term Goals       PT LONG TERM GOAL #1   Title Patient will increase FOTO score to equal to or greater than 53 to  demonstrate statistically significant improvement in mobility and quality of life.    Baseline 5/15: 44    Time 12    Period Weeks    Status New    Target Date 01/10/22      PT LONG TERM GOAL #2   Title Patient will report a worst headache pain of 3/10 to improve tolerance with ADLs and reduced symptoms with activities.    Baseline 5/15: worst pain 8/10 (with medication)    Time 12    Period Weeks    Status New    Target Date 01/10/22      PT LONG TERM GOAL #3   Title Patient will increase UE gross strength to 4+/5 as to improve functional strength for increased ADL ability and ability to perform work duties.    Baseline 5/15: Grossly 4/5 BUEs and pain limited    Time 12    Period Weeks    Status New    Target Date 01/10/22      PT LONG TERM GOAL #4   Title The pt will show at least a 10 degree improvement with cervical L lateral flexion, extension, flexion, and bilateral rotatoin.    Baseline 5/16: Ext. 15 deg.   Flex. 23 deg.   Lateral flex: 20 deg. L  Rotation: 20-23 deg Bilat.    Time 12    Period Weeks    Status New    Target Date 01/10/22    PT LONG TERM GOAL #5  Title Patient will report a worst pain of 3/10 on VAS in R shoulder to improve tolerance with ADLs and reduced symptoms with activities.   Baseline 6/22: 8/10  Time 12   Period Weeks   Status New   Target Date 01/10/22     PT LONG TERM GOAL #6  Title Patient will  demonstrate adequate shoulder ROM and strength to be able to shave and dress independently with pain less than 3/10.  Baseline 6/22: flexion 41, abduction 72; unable to test strength  Time 12   Period Weeks   Status New   Target Date 01/10/22     PT LONG TERM GOAL #7  Title  Patient will decrease Quick DASH score by > 8 points (71.5%)demonstrating reduced self-reported upper extremity disability.  Baseline 6/22: 79.5%  Time 12   Period Weeks   Status New   Target Date 01/10/22             Plan     Clinical Impression Statement Pt with excellent motivation to participate in session. Continued focus on instructing pt through modifications of intensity/positioning to increase comfort with interventions. Pt still pain-limited with majority of exercises, however, did note that ulnar nerve glides and use of elastogel/ice improved symptoms, ulnar nerve glides provided good stretch sensation. Pt also with minimal discomfort with STM as long as gentle pressure used. Will continue to try to progress intensity of RUE mm activation and range of motion within patient's pain tolerance and pain-limited range. The pt will benefit from further skilled PT to improve pain, ROM and strength to increase QOL and ease with ADLs.      Personal Factors and Comorbidities Comorbidity 3+;Sex;Time since onset of injury/illness/exacerbation    Comorbidities PMH: anxiety (claustrophobic), back pain, asthma, biceps tendonosis of R shoulder, DM, dyspnea, L frozen shouler (steroid injection 10/05/21), hx of concussion, headache disorder, HTN, HTN retinopathy, IBS, joint pain, leg edema, palpitation, R shoulder impingement syndrome, vitamin D difictiency, uveitis, paroxysmal supraventricular tachycardia, shoulder arthroscopy with  subacromial decompression and mini open RTC repair, open bicep tenodesis    Examination-Activity Limitations Locomotion Level;Caring for Others;Other;Reach Overhead;Carry   with increased pain pt  reports must rest, limits ability to perform job duties/ADLs   Examination-Participation Restrictions Occupation;Community Activity;Shop;Driving;Cleaning;Yard Work;Other;Laundry;Meal Prep   stiffness of cervical spine limits ability to look over shoulder with driving   Stability/Clinical Decision Making Evolving/Moderate complexity    Rehab Potential Good    PT Frequency 2x / week    PT Duration 12 weeks    PT Treatment/Interventions ADLs/Self Care Home Management;Biofeedback;Canalith Repostioning;Cryotherapy;Electrical Stimulation;Moist Heat;Traction;Ultrasound;DME Instruction;Gait training;Stair training;Functional mobility training;Therapeutic exercise;Balance training;Neuromuscular re-education;Patient/family education;Orthotic Fit/Training;Therapeutic activities;Manual techniques;Scar mobilization;Manual lymph drainage;Compression bandaging;Passive range of motion;Dry needling;Energy conservation;Splinting;Taping;Vestibular;Spinal Manipulations;Joint Manipulations;Iontophoresis 4mg /ml Dexamethasone    PT Next Visit Plan E stim, ROM, isometrics    PT Home Exercise Plan Access Code: [email or web address] Ulnar nerve glide added to HEP to be performed daily    Consulted and Agree with Plan of Care Patient            Temple Pacini PT, DPT    12/16/2021, 10:09 AM

## 2021-12-20 ENCOUNTER — Ambulatory Visit: Payer: 59

## 2021-12-20 ENCOUNTER — Other Ambulatory Visit: Payer: Self-pay | Admitting: Internal Medicine

## 2021-12-20 ENCOUNTER — Other Ambulatory Visit: Payer: Self-pay

## 2021-12-20 DIAGNOSIS — M6281 Muscle weakness (generalized): Secondary | ICD-10-CM | POA: Diagnosis not present

## 2021-12-20 DIAGNOSIS — M25511 Pain in right shoulder: Secondary | ICD-10-CM | POA: Diagnosis not present

## 2021-12-20 DIAGNOSIS — M542 Cervicalgia: Secondary | ICD-10-CM

## 2021-12-20 DIAGNOSIS — G8929 Other chronic pain: Secondary | ICD-10-CM

## 2021-12-20 DIAGNOSIS — M25611 Stiffness of right shoulder, not elsewhere classified: Secondary | ICD-10-CM | POA: Diagnosis not present

## 2021-12-20 MED ORDER — METOPROLOL SUCCINATE ER 25 MG PO TB24
12.5000 mg | ORAL_TABLET | Freq: Every day | ORAL | 3 refills | Status: DC
  Filled 2021-12-20: qty 45, 90d supply, fill #0
  Filled 2022-03-22: qty 45, 90d supply, fill #1
  Filled 2022-05-19 – 2022-08-02 (×2): qty 45, 90d supply, fill #2
  Filled 2022-11-28: qty 45, 90d supply, fill #3

## 2021-12-20 MED ORDER — ATORVASTATIN CALCIUM 20 MG PO TABS
20.0000 mg | ORAL_TABLET | Freq: Every day | ORAL | 3 refills | Status: DC
Start: 2021-12-20 — End: 2022-12-29
  Filled 2021-12-20: qty 90, 90d supply, fill #0
  Filled 2022-03-22: qty 90, 90d supply, fill #1
  Filled 2022-08-02: qty 90, 90d supply, fill #2

## 2021-12-20 NOTE — Therapy (Signed)
OUTPATIENT PHYSICAL THERAPY TREATMENT NOTE   Patient Name: [name] MRN: [id number] DOB:1971/01/31, 51 y.o., female Today's Date: 12/20/2021  PCP: Crecencio Mc MD  REFERRING PROVIDER: Vanetta Mulders MD    Past Medical History:  Diagnosis Date   Allergy    Anemia    Anxiety    claustrophobic   Asthma    Back pain    Biceps tendonosis of right shoulder    Cataract    Mixed OU   COVID-19    covid PNA hospitalized 2020, 06/06/21   Diabetes mellitus 2011   did not start metforfin, losing weight   Dyspnea    Fatty liver    Food allergy    Frozen shoulder    left, had steroid injection 10/05/21   Headache disorder    History of concussion    HLD (hyperlipidemia)    Hypertension    Hypertensive retinopathy    OU   IBS (irritable bowel syndrome)    Infertility, female    Joint pain    Lactose intolerance    Leg edema    Migraines    Palpitation    Post-menopausal    Seborrheic dermatitis    Shoulder impingement syndrome, right    Uveitis    [place] est in 2020 seen specialist in [place] [facility] last seen 11 or 05/2020   Vitamin D deficiency    Past Surgical History:  Procedure Laterality Date   ABDOMINAL HYSTERECTOMY  2006   heavy menses, endometriosis, l oophrectomy   BACK SURGERY     BREAST BIOPSY Right 2018   benign   BREAST EXCISIONAL BIOPSY Right 2003   benign   BREAST EXCISIONAL BIOPSY Right 1999   benign   BREAST SURGERY     right breast x 2 , benign   COLONOSCOPY  10/06/2020   HERNIA REPAIR  2003   left inguinal    LEFT OOPHORECTOMY     SHOULDER ARTHROSCOPY WITH SUBACROMIAL DECOMPRESSION AND OPEN ROTATOR C Right 07/14/2016   Procedure: RIGHT SHOULDER ARTHROSCOPY WITH SUBACROMIAL DECOMPRESSION, DISTAL CLAVICLE RESECTION AND MINI OPEN ROTATOR CUFF REPAIR, OPEN BICEP TENDODESIS;  Surgeon: Garald Balding, MD;  Location: [facility];  Service: Orthopedics;  Laterality: Right;   SHOULDER CLOSED REDUCTION Right 09/08/2016    Procedure: RIGHT CLOSED MANIPULATION SHOULDER;  Surgeon: Garald Balding, MD;  Location: [facility];  Service: Orthopedics;  Laterality: Right;   TUBAL LIGATION     UPPER GASTROINTESTINAL ENDOSCOPY  10/06/2020   Patient Active Problem List   Diagnosis Date Noted   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 09/21/2021   Chronic left shoulder pain 09/21/2021   Sinus tachycardia 06/23/2021   Fatty infiltration of liver 07/26/2020   Diarrhea, functional 06/22/2020   Uveitis, intermediate, bilateral 09/07/2019   Generalized edema 06/13/2019   Abnormal electrocardiogram (ECG) (EKG) 06/07/2019   Hospital discharge follow-up 06/07/2019   Postviral fatigue syndrome 05/30/2019   Multinodular goiter 12/05/2017   Multiple thyroid nodules 12/05/2017   Microalbuminuria due to type 2 diabetes mellitus ([place]) 11/26/2017   Encounter for preventive health examination 11/26/2017   Menopause syndrome 06/10/2017   Adverse reaction to vaccine, sequela 03/25/2017   Palpitations 02/27/2017   PAC (premature atrial contraction) 02/27/2017   PVC's (premature ventricular contractions) 02/27/2017   PSVT (paroxysmal supraventricular tachycardia) ([place]) 11/26/2016   Nontraumatic incomplete tear of right rotator cuff 07/14/2016   AC (acromioclavicular) arthritis 07/14/2016   Impingement syndrome of right shoulder  07/14/2016   Fibrocystic breast changes, right 05/21/2016   Menopausal symptoms 10/30/2015   Insomnia 10/16/2015   Snoring 07/26/2015   Chronic migraine w/o aura w/o status migrainosus, not intractable 02/24/2015   History of concussion 07/21/2014   Hyperlipidemia associated with type 2 diabetes mellitus ([place]) 06/13/2014   Vitamin D deficiency 05/07/2013   Chronic migraine 11/20/2012   S/P Total Abdominal Hysterectomy and Left Salpingo-oophorectomy 10/20/2011   Headache around the eyes    Uncontrolled type 2 diabetes mellitus 07/20/2011   Obesity (BMI 30-39.9)  07/20/2011   Essential hypertension 07/20/2011    REFERRING DIAG: traumatic incomplete tear of R rotator cuff/migraine/cervicalgia   THERAPY DIAG:  No diagnosis found.  Rationale for Evaluation and Treatment Rehabilitation  PERTINENT HISTORY: Pt is a pleasant 51 y/o female presenting to PT due to chronic migraine headaches with cervical and bilateral shoulder pain. Pt has headaches every day. Pt reports onset of migraines at [age] years old where she had to seek emergency care due to severity of migraines. Pt reports COVID19 illness in 2020 and that migraines have been different since, including n/t that can occur on one side of her face (pt being followed by neurologist). Pt reports she has tried various medications, muscle relaxers to control migraines and related pain but that nothing has worked well yet. Other symptoms with her migraines include motion sickness, nausea, spinning sensation with her eyes closed, light and noise sensitivity, heightened sensitivity to touch on her face and scalp. 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 Pt with current R shoulder pain (reports has "possibly re-torn" R RTC), chronic R restrictions since 2020-2021 of no lifting greater than 10-15 lbs, no reaching behind, no reaching above her head. Hx of R RTC repair 2018 per chart with dx of R shoulder adhesive capsulitis. Pt currently being treated for adhesive capsulitis of L shoulder..  Per chart other PMH significant for PMH: anxiety (claustrophobic), back pain, asthma, biceps tendonosis of R shoulder, DM, dyspnea, L frozen shouler (steroid injection 10/05/21), hx of concussion, headache disorder, HTN, HTN retinopathy, IBS, joint pain, leg edema, palpitation, R shoulder impingement syndrome, vitamin D deficiency, uveitis, paroxysmal  supraventricular tachycardia, shoulder arthroscopy with subacromial decompression and mini open RTC repair, open bicep tenodesis  PRECAUTIONS: R shoulder; permanent restrictions. Do not lift more than 15lb.  Unless otherwise stated, all objective data taken from initial evaluation for shoulder    AROM 11/25/21 Right 11/25/21 Left 11/25/21  Shoulder Flexion 41 *  120  Shoulder Abduction 72* 125  ER 2 * WFL  IR 8* WFL  Elbow flexion  40* WFL  Elbow extension [facility] WFL   painful   SUBJECTIVE: Pt reports no changes since previous session. She has had a busy day.   PAIN:  Are you having pain? Yes: NPRS scale: present but not rated/10 Pain location: R shoulder Pain description: dull ache Aggravating factors: hanging her shoulder,driving  Relieving factors: supporting arm   TODAY'S TREATMENT:   12/20/2021  SPT assists with session  Therex- Elsatogel donned to R shoulder while pt performs the following:  Seated: RUE ulnar nerve glide x multiple reps. Cuing to hold end range for stretch  Cervical rotation stretch 60 sec B  Cervical ext 2x30 sec  Cervical flex 2x30 sec  Shoulder rolls CW/CC 15x each B.  Provided education regarding sleeping positioning for RUE to reduce n/t symptoms at night.  Shrugs 10 reps with 1-2 sec holds. Limited activation of R side noted. Able to perform through limited ROM.  Patient supine on plinth (bolster supporting BLEs), towel roles under RUE, and elastogel donned to R posterior shoulder and along R tricep:   Supine chin tucks 2x12x 3 sec holds  RUE Isometric:  - RUE elbow ext. At 90 deg 6x 3 sec. Pt reports feels OK, however does report increased numbness in hand. PROM and AROM elbow flex/ext between isometric reps (PROM discontinued due to mm spasm felt in tricep)  Cervical isometric:   -Rotation 5x 2-3 sec holds bilaterally   - Lateral flexion 5x 2-3 sec holds bilaterally. Pain felt in R upper trap (TrP). Pt provided manual (see below)  following exercise.   Seated: Scapular squeezes 2x5 with TC. Noted decrease in UT activation/relaxation of muscles  RUE limited ROM shrug with TC 5x, cuing for slow, gentle movement  With pt in seated cuing to maintain neutral postioning of cervical spine as pt noted to be in R lateral cervical flexion.  Manual- Pt supine. SPT provides TrP release to R upper trap in 5 sec bouts, gentle pressure. Modified to providing TrP release with pt in a seated position due to reports of increased discomfort in RUE from supine positioning. Pt reports burning sensation in RUE, intervention discontinued.  X 4 min  Pt seated with elastogel donned at end of session for pain modulation x 4 min (unbilled). Elastogel continues to feel good and reduce symptoms   PATIENT EDUCATION: Education details: exercise technique, Economist Person educated: Patient Education method: Explanation, Demonstration, Tactile cues, and Verbal cues Education comprehension: verbalized understanding, returned demonstration, verbal cues required, and needs further education   HOME EXERCISE PROGRAM: no updates on this date 12/20/2021 Theraputty for grip strength    PT Short Term Goals       PT SHORT TERM GOAL #1   Title Patient will be independent in home exercise program to improve strength/mobility for better functional independence with ADLs.    Baseline 5/15: initiated    Time 6    Period Weeks    Status New    Target Date 11/30/21    PT SHORT TERM GOAL #2  Title Patient will report a worst pain of 5/10 on VAS in  R shoulder to improve tolerance with ADLs and reduced symptoms with activities  Baseline 6/22: 8/10  Time 6   Period Weeks   Status New   Target Date 11/30/21             PT Long Term Goals       PT LONG TERM GOAL #1   Title Patient will increase FOTO score to equal to or greater than 53 to demonstrate statistically significant improvement in mobility and quality of life.    Baseline 5/15:  44    Time 12    Period Weeks    Status New    Target Date 01/10/22      PT LONG TERM GOAL #2   Title Patient will report a worst headache pain of 3/10 to improve tolerance with ADLs and reduced symptoms with activities.    Baseline 5/15: worst pain 8/10 (with medication)    Time 12    Period Weeks    Status New    Target Date 01/10/22      PT LONG TERM GOAL #3   Title Patient will increase  UE gross strength to 4+/5 as to improve functional strength for increased ADL ability and ability to perform work duties.    Baseline 5/15: Grossly 4/5 BUEs and pain limited    Time 12    Period Weeks    Status New    Target Date 01/10/22      PT LONG TERM GOAL #4   Title The pt will show at least a 10 degree improvement with cervical L lateral flexion, extension, flexion, and bilateral rotatoin.    Baseline 5/16: Ext. 15 deg.   Flex. 23 deg.   Lateral flex: 20 deg. L  Rotation: 20-23 deg Bilat.    Time 12    Period Weeks    Status New    Target Date 01/10/22    PT LONG TERM GOAL #5  Title Patient will report a worst pain of 3/10 on VAS in R shoulder to improve tolerance with ADLs and reduced symptoms with activities.   Baseline 6/22: 8/10  Time 12   Period Weeks   Status New   Target Date 01/10/22     PT LONG TERM GOAL #6  Title Patient will demonstrate adequate shoulder ROM and strength to be able to shave and dress independently with pain less than 3/10.  Baseline 6/22: flexion 41, abduction 72; unable to test strength  Time 12   Period Weeks   Status New   Target Date 01/10/22     PT LONG TERM GOAL #7  Title  Patient will decrease Quick DASH score by > 8 points (71.5%)demonstrating reduced self-reported upper extremity disability.  Baseline 6/22: 79.5%  Time 12   Period Weeks   Status New   Target Date 01/10/22             Plan     Clinical Impression Statement Pt increasingly pain-limited with interventions on this date. Difficulty finding comfortable positioning  of RUE, due to spasm felt in R UT region and continued n/t in R hand that appears to worsen with increased elbow flexion. Pt did note ulnar nerve glides continue to feel like a good stretch, and scapular squeezes paired with shoulder shrug resulted in improvement in spasm felt in R upper trap. Plan to focus on isometrics and gentle strengthening within pain tolerance next session. The pt will benefit from further skilled PT to improve pain, ROM and strength to increase QOL and ease with ADLs.      Personal Factors and Comorbidities Comorbidity 3+;Sex;Time since onset of injury/illness/exacerbation    Comorbidities PMH: anxiety (claustrophobic), back pain, asthma, biceps tendonosis of R shoulder, DM, dyspnea, L frozen shouler (steroid injection 10/05/21), hx of concussion, headache disorder, HTN, HTN retinopathy, IBS, joint pain, leg edema, palpitation, R shoulder impingement syndrome, vitamin D difictiency, uveitis, paroxysmal supraventricular tachycardia, shoulder arthroscopy with subacromial decompression and mini open RTC repair, open bicep tenodesis    Examination-Activity Limitations Locomotion Level;Caring for Others;Other;Reach Overhead;Carry   with increased pain pt reports must rest, limits ability to perform job duties/ADLs   Examination-Participation Restrictions Occupation;Community Activity;Shop;Driving;Cleaning;Yard Work;Other;Laundry;Meal Prep   stiffness of cervical spine limits ability to look over shoulder with driving   Stability/Clinical Decision Making Evolving/Moderate complexity    Rehab Potential Good    PT Frequency 2x / week    PT Duration 12 weeks    PT Treatment/Interventions ADLs/Self Care Home Management;Biofeedback;Canalith Repostioning;Cryotherapy;Electrical Stimulation;Moist Heat;Traction;Ultrasound;DME Instruction;Gait training;Stair training;Functional mobility training;Therapeutic exercise;Balance training;Neuromuscular re-education;Patient/family education;Orthotic  Fit/Training;Therapeutic activities;Manual techniques;Scar mobilization;Manual lymph drainage;Compression bandaging;Passive range of motion;Dry needling;Energy conservation;Splinting;Taping;Vestibular;Spinal Manipulations;Joint Manipulations;Iontophoresis  $'4mg'Q$ /ml Dexamethasone    PT Next Visit Plan E stim, ROM, isometrics    PT Home Exercise Plan Access Code: [email or web address] Ulnar nerve glide added to HEP to be performed daily    Consulted and Agree with Plan of Care Patient            Ricard Dillon PT, DPT    12/20/2021, 4:04 PM

## 2021-12-22 ENCOUNTER — Ambulatory Visit: Payer: 59

## 2021-12-22 DIAGNOSIS — M25611 Stiffness of right shoulder, not elsewhere classified: Secondary | ICD-10-CM | POA: Diagnosis not present

## 2021-12-22 DIAGNOSIS — G8929 Other chronic pain: Secondary | ICD-10-CM

## 2021-12-22 DIAGNOSIS — M542 Cervicalgia: Secondary | ICD-10-CM

## 2021-12-22 DIAGNOSIS — M6281 Muscle weakness (generalized): Secondary | ICD-10-CM

## 2021-12-22 DIAGNOSIS — M25511 Pain in right shoulder: Secondary | ICD-10-CM | POA: Diagnosis not present

## 2021-12-22 NOTE — Therapy (Signed)
OUTPATIENT PHYSICAL THERAPY TREATMENT NOTE/Physical Therapy Progress Note   Dates of reporting period  10/18/2021   to   12/22/2021    Patient Name: [name] MRN: [id number] DOB:December 03, 1970, 51 y.o., female Today's Date: 12/22/2021  PCP: Crecencio Mc MD  REFERRING PROVIDER: Vanetta Mulders MD   PT End of Session - 12/22/21 1607     Visit Number 10    Number of Visits 25    Date for PT Re-Evaluation 01/10/22    Authorization Time Period 10/18/21-01/10/22    PT Start Time 8676    PT Stop Time 1950    PT Time Calculation (min) 47 min    Activity Tolerance Patient limited by pain    Behavior During Therapy [facility] for tasks assessed/performed             Past Medical History:  Diagnosis Date   Allergy    Anemia    Anxiety    claustrophobic   Asthma    Back pain    Biceps tendonosis of right shoulder    Cataract    Mixed OU   COVID-19    covid PNA hospitalized 2020, 06/06/21   Diabetes mellitus 2011   did not start metforfin, losing weight   Dyspnea    Fatty liver    Food allergy    Frozen shoulder    left, had steroid injection 10/05/21   Headache disorder    History of concussion    HLD (hyperlipidemia)    Hypertension    Hypertensive retinopathy    OU   IBS (irritable bowel syndrome)    Infertility, female    Joint pain    Lactose intolerance    Leg edema    Migraines    Palpitation    Post-menopausal    Seborrheic dermatitis    Shoulder impingement syndrome, right    Uveitis    [place] [facility] est in 2020 seen specialist in [place] [facility] last seen 11 or 05/2020   Vitamin D deficiency    Past Surgical History:  Procedure Laterality Date   ABDOMINAL HYSTERECTOMY  2006   heavy menses, endometriosis, l oophrectomy   BACK SURGERY     BREAST BIOPSY Right 2018   benign   BREAST EXCISIONAL BIOPSY Right 2003   benign   BREAST EXCISIONAL BIOPSY Right 1999   benign   BREAST SURGERY     right breast x 2 , benign   COLONOSCOPY  10/06/2020   HERNIA  REPAIR  2003   left inguinal    LEFT OOPHORECTOMY     SHOULDER ARTHROSCOPY WITH SUBACROMIAL DECOMPRESSION AND OPEN ROTATOR C Right 07/14/2016   Procedure: RIGHT SHOULDER ARTHROSCOPY WITH SUBACROMIAL DECOMPRESSION, DISTAL CLAVICLE RESECTION AND MINI OPEN ROTATOR CUFF REPAIR, OPEN BICEP TENDODESIS;  Surgeon: Garald Balding, MD;  Location: [place];  Service: Orthopedics;  Laterality: Right;   SHOULDER CLOSED REDUCTION Right 09/08/2016   Procedure: RIGHT CLOSED MANIPULATION SHOULDER;  Surgeon: Garald Balding, MD;  Location: [place];  Service: Orthopedics;  Laterality: Right;   TUBAL LIGATION     UPPER GASTROINTESTINAL ENDOSCOPY  10/06/2020   Patient Active Problem List   Diagnosis Date Noted   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 09/21/2021   Chronic left shoulder pain 09/21/2021   Sinus tachycardia 06/23/2021   Fatty infiltration of liver 07/26/2020   Diarrhea, functional 06/22/2020   Uveitis, intermediate, bilateral 09/07/2019   Generalized edema 06/13/2019   Abnormal electrocardiogram (  ECG) (EKG) 06/07/2019   Hospital discharge follow-up 06/07/2019   Postviral fatigue syndrome 05/30/2019   Multinodular goiter 12/05/2017   Multiple thyroid nodules 12/05/2017   Microalbuminuria due to type 2 diabetes mellitus ([place]) 11/26/2017   Encounter for preventive health examination 11/26/2017   Menopause syndrome 06/10/2017   Adverse reaction to vaccine, sequela 03/25/2017   Palpitations 02/27/2017   PAC (premature atrial contraction) 02/27/2017   PVC's (premature ventricular contractions) 02/27/2017   PSVT (paroxysmal supraventricular tachycardia) ([place]) 11/26/2016   Nontraumatic incomplete tear of right rotator cuff 07/14/2016   AC (acromioclavicular) arthritis 07/14/2016   Impingement syndrome of right shoulder 07/14/2016   Fibrocystic breast changes, right 05/21/2016   Menopausal symptoms 10/30/2015   Insomnia 10/16/2015    Snoring 07/26/2015   Chronic migraine w/o aura w/o status migrainosus, not intractable 02/24/2015   History of concussion 07/21/2014   Hyperlipidemia associated with type 2 diabetes mellitus ([place]) 06/13/2014   Vitamin D deficiency 05/07/2013   Chronic migraine 11/20/2012   S/P Total Abdominal Hysterectomy and Left Salpingo-oophorectomy 10/20/2011   Headache around the eyes    Uncontrolled type 2 diabetes mellitus 07/20/2011   Obesity (BMI 30-39.9) 07/20/2011   Essential hypertension 07/20/2011    REFERRING DIAG: traumatic incomplete tear of R rotator cuff/migraine/cervicalgia   THERAPY DIAG:  Chronic right shoulder pain  Cervicalgia  Muscle weakness (generalized)  Stiffness of right shoulder, not elsewhere classified  Rationale for Evaluation and Treatment Rehabilitation  PERTINENT HISTORY: Pt is a pleasant 51 y/o female presenting to PT due to chronic migraine headaches with cervical and bilateral shoulder pain. Pt has headaches every day. Pt reports onset of migraines at 4-1 years old where she had to seek emergency care due to severity of migraines. Pt reports COVID19 illness in 2020 and that migraines have been different since, including n/t that can occur on one side of her face (pt being followed by neurologist). Pt reports she has tried various medications, muscle relaxers to control migraines and related pain but that nothing has worked well yet. Other symptoms with her migraines include motion sickness, nausea, spinning sensation with her eyes closed, light and noise sensitivity, heightened sensitivity to touch on her face and scalp. 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 Pt with current R shoulder pain (reports has "possibly re-torn" R RTC), chronic R restrictions since 2020-2021 of no  lifting greater than 10-15 lbs, no reaching behind, no reaching above her head. Hx of R RTC repair 2018 per chart with dx of R shoulder adhesive capsulitis. Pt currently being treated for adhesive capsulitis of L shoulder..  Per chart other PMH significant for PMH: anxiety (claustrophobic), back pain, asthma, biceps tendonosis of R shoulder, DM, dyspnea, L frozen shouler (steroid injection 10/05/21), hx of concussion, headache disorder, HTN, HTN retinopathy, IBS, joint pain, leg edema, palpitation, R shoulder impingement syndrome, vitamin D deficiency, uveitis, paroxysmal supraventricular tachycardia, shoulder arthroscopy with subacromial decompression and mini open RTC repair, open bicep tenodesis  PRECAUTIONS: R shoulder; permanent restrictions. Do not lift more than 15lb.  Unless otherwise stated, all objective data taken from initial evaluation for shoulder    AROM 11/25/21 Right 11/25/21 Left 11/25/21  Shoulder Flexion 41 *  120  Shoulder Abduction 72* 125  ER 2 * WFL  IR 8* WFL  Elbow flexion  40* WFL  Elbow extension [facility] WFL   painful   SUBJECTIVE: Pt reports has had sharp stabbing pain since leaving here last time. Having spasm in [place], had to leave work early yesterday as a result.    PAIN:  Are you having pain? Yes: NPRS scale: present but not rated/10 Pain location: R shoulder Pain description: sore, occasional sharp stabbing Aggravating factors: hanging her shoulder,driving  Relieving factors: supporting arm   TODAY'S TREATMENT:  12/22/2021  Reassessed goals for progress note, see goal section for further information. - FOTO (shoulder): 40 - QuickDASH: 65.9 - Cervical ROM: see goal - Shoulder ROM: see goal - Shoulder strength MMT: deferred on this date due to elevated pain levels  Therex -  Elsatogel donned to R shoulder while pt performs the following:  Seated: - RUE ulnar nerve glide x multiple reps. Pt reports as a "good stretch" - Shoulder ext isometrics: 10x with  5 sec hold, pt reports sharp pain in R [place]/shoulder region  Supine: - Shoulder ext isometrics: multiple reps with 3-5 sec hold, pt reports no increase in pain     PATIENT EDUCATION: Education details: exercise technique, body mechanics Person educated: Patient Education method: Explanation, Demonstration, Tactile cues, and Verbal cues Education comprehension: verbalized understanding, returned demonstration, verbal cues required, and needs further education   HOME EXERCISE PROGRAM:  12/20/2021: no updates  Theraputty for grip strength    PT Short Term Goals       PT SHORT TERM GOAL #1   Title Patient will be independent in home exercise program to improve strength/mobility for better functional independence with ADLs.    Baseline 5/15: initiated; 7/19: reports been doing them, really likes the ulnar nerve glide   Time 6    Period Weeks    Status On-going   Target Date 11/30/21    PT SHORT TERM GOAL #2  Title Patient will report a worst pain of 5/10 on VAS in  R shoulder to improve tolerance with ADLs and reduced symptoms with activities  Baseline 6/22: 8/10; 7/19: 6/10   Time 6   Period Weeks   Status On-going   Target Date 11/30/21             PT Long Term Goals       PT LONG TERM GOAL #1   Title Patient will increase FOTO score to equal to or greater than 60  to demonstrate statistically significant improvement in mobility and quality of life.    Baseline  7/19: 40   Time 12    Period Weeks    Status Goal Revised (now using Shoulder FOTO)   Target Date 01/10/22      PT LONG TERM GOAL #2   Title Patient will report a worst headache pain of 3/10 to improve tolerance with ADLs and reduced symptoms with activities.    Baseline 5/15: worst pain 8/10 (with medication); 7/19: 6/10   Time 12    Period Weeks    Status On-going    Target Date 01/10/22      PT LONG TERM GOAL #3   Title Patient will increase UE gross strength to 4+/5 as to improve functional  strength for increased ADL ability and ability to perform work duties.    Baseline 5/15: Grossly 4/5 BUEs and pain limited; 7/19: unable to test due to pain   Time 12    Period Weeks    Status On-going    Target Date 01/10/22      PT LONG TERM GOAL #  4   Title The pt will show at least a 10 degree improvement with cervical L lateral flexion, extension, flexion, and bilateral rotatoin.    Baseline 5/16: Ext. 15 deg.   Flex. 23 deg.   Lateral flex: 20 deg. L  Rotation: 20-23 deg Bilat.; 7/19: L rotation 20 deg, Ext 17 deg, flex 25 deg, lat flex: 21 deg   Time 12    Period Weeks    Status On-going    Target Date 01/10/22    PT LONG TERM GOAL #5  Title Patient will report a worst pain of 3/10 on VAS in R shoulder to improve tolerance with ADLs and reduced symptoms with activities.   Baseline 6/22: 8/10; 7/19: 6/10  Time 12   Period Weeks   Status On-going   Target Date 01/10/22     PT LONG TERM GOAL #6  Title Patient will demonstrate adequate shoulder ROM and strength to be able to shave and dress independently with pain less than 3/10.  Baseline 6/22: flexion 41, abduction 72; unable to test strength; 7/19: flex 80 deg, abd 55 deg painful, unable to test strength  Time 12   Period Weeks   Status New   Target Date 01/10/22     PT LONG TERM GOAL #7  Title  Patient will decrease Quick DASH score by > 8 points (71.5%)demonstrating reduced self-reported upper extremity disability.  Baseline 6/22: 79.5%; 7/19: 65.9%  Time 12   Period Weeks   Status Achieved   Target Date 01/10/22             Plan     Clinical Impression Statement Pt is slowly progressing towards goals. Demonstrates improved cervical ROM and shoulder flexion, however, pt is still significantly limited by pain. The pt met her QuickDASH goal indicating a decrease in self-reported UE disability. All strength testing deferred on this date secondary to increased pain levels. The pt will benefit from further skilled PT  to improve pain, ROM and strength to increase QOL and ease with ADLs.       Personal Factors and Comorbidities Comorbidity 3+;Sex;Time since onset of injury/illness/exacerbation    Comorbidities PMH: anxiety (claustrophobic), back pain, asthma, biceps tendonosis of R shoulder, DM, dyspnea, L frozen shouler (steroid injection 10/05/21), hx of concussion, headache disorder, HTN, HTN retinopathy, IBS, joint pain, leg edema, palpitation, R shoulder impingement syndrome, vitamin D difictiency, uveitis, paroxysmal supraventricular tachycardia, shoulder arthroscopy with subacromial decompression and mini open RTC repair, open bicep tenodesis    Examination-Activity Limitations Locomotion Level;Caring for Others;Other;Reach Overhead;Carry   with increased pain pt reports must rest, limits ability to perform job duties/ADLs   Examination-Participation Restrictions Occupation;Community Activity;Shop;Driving;Cleaning;Yard Work;Other;Laundry;Meal Prep   stiffness of cervical spine limits ability to look over shoulder with driving   Stability/Clinical Decision Making Evolving/Moderate complexity    Rehab Potential Good    PT Frequency 2x / week    PT Duration 12 weeks    PT Treatment/Interventions ADLs/Self Care Home Management;Biofeedback;Canalith Repostioning;Cryotherapy;Electrical Stimulation;Moist Heat;Traction;Ultrasound;DME Instruction;Gait training;Stair training;Functional mobility training;Therapeutic exercise;Balance training;Neuromuscular re-education;Patient/family education;Orthotic Fit/Training;Therapeutic activities;Manual techniques;Scar mobilization;Manual lymph drainage;Compression bandaging;Passive range of motion;Dry needling;Energy conservation;Splinting;Taping;Vestibular;Spinal Manipulations;Joint Manipulations;Iontophoresis 4mg /ml Dexamethasone    PT Next Visit Plan E stim, ROM, isometrics    PT Home Exercise Plan Access Code: [email or web address] Ulnar nerve glide added to HEP to be performed daily     Consulted and Agree with Plan of Care Patient            Izola Price, SPT  Ricard Dillon PT, DPT  This entire session was performed under direct supervision and direction of a licensed therapist. I have personally read, edited and approve of the note as written.  12/22/2021, 5:03 PM

## 2021-12-24 ENCOUNTER — Ambulatory Visit (HOSPITAL_BASED_OUTPATIENT_CLINIC_OR_DEPARTMENT_OTHER): Payer: 59 | Admitting: Orthopaedic Surgery

## 2021-12-27 ENCOUNTER — Ambulatory Visit: Payer: 59

## 2021-12-27 ENCOUNTER — Other Ambulatory Visit: Payer: Self-pay

## 2021-12-27 DIAGNOSIS — M6281 Muscle weakness (generalized): Secondary | ICD-10-CM

## 2021-12-27 DIAGNOSIS — G8929 Other chronic pain: Secondary | ICD-10-CM | POA: Diagnosis not present

## 2021-12-27 DIAGNOSIS — M542 Cervicalgia: Secondary | ICD-10-CM | POA: Diagnosis not present

## 2021-12-27 DIAGNOSIS — M25611 Stiffness of right shoulder, not elsewhere classified: Secondary | ICD-10-CM | POA: Diagnosis not present

## 2021-12-27 DIAGNOSIS — M25511 Pain in right shoulder: Secondary | ICD-10-CM | POA: Diagnosis not present

## 2021-12-27 NOTE — Therapy (Signed)
OUTPATIENT PHYSICAL THERAPY TREATMENT NOTE   Patient Name: [name] MRN: [id number] DOB:[birth year], 51 y.o., female Today's Date: 12/27/2021  PCP: Crecencio Mc MD  REFERRING PROVIDER: Vanetta Mulders MD   PT End of Session - 12/27/21 1653     Visit Number 11    Number of Visits 25    Date for PT Re-Evaluation 01/10/22    Authorization Time Period 10/18/21-01/10/22    PT Start Time 1603    PT Stop Time 2376    PT Time Calculation (min) 40 min    Activity Tolerance Patient limited by pain    Behavior During Therapy [facility] for tasks assessed/performed              Past Medical History:  Diagnosis Date   Allergy    Anemia    Anxiety    claustrophobic   Asthma    Back pain    Biceps tendonosis of right shoulder    Cataract    Mixed OU   COVID-19    covid PNA hospitalized 2020, 06/06/21   Diabetes mellitus 2011   did not start metforfin, losing weight   Dyspnea    Fatty liver    Food allergy    Frozen shoulder    left, had steroid injection 10/05/21   Headache disorder    History of concussion    HLD (hyperlipidemia)    Hypertension    Hypertensive retinopathy    OU   IBS (irritable bowel syndrome)    Infertility, female    Joint pain    Lactose intolerance    Leg edema    Migraines    Palpitation    Post-menopausal    Seborrheic dermatitis    Shoulder impingement syndrome, right    Uveitis    [place] [facility] est in 2020 seen specialist in [place] [facility] last seen 11 or 05/2020   Vitamin D deficiency    Past Surgical History:  Procedure Laterality Date   ABDOMINAL HYSTERECTOMY  2006   heavy menses, endometriosis, l oophrectomy   BACK SURGERY     BREAST BIOPSY Right 2018   benign   BREAST EXCISIONAL BIOPSY Right 2003   benign   BREAST EXCISIONAL BIOPSY Right 1999   benign   BREAST SURGERY     right breast x 2 , benign   COLONOSCOPY  10/06/2020   HERNIA REPAIR  2003   left inguinal    LEFT OOPHORECTOMY     SHOULDER ARTHROSCOPY WITH  SUBACROMIAL DECOMPRESSION AND OPEN ROTATOR C Right 07/14/2016   Procedure: RIGHT SHOULDER ARTHROSCOPY WITH SUBACROMIAL DECOMPRESSION, DISTAL CLAVICLE RESECTION AND MINI OPEN ROTATOR CUFF REPAIR, OPEN BICEP TENDODESIS;  Surgeon: Garald Balding, MD;  Location: [place];  Service: Orthopedics;  Laterality: Right;   SHOULDER CLOSED REDUCTION Right 09/08/2016   Procedure: RIGHT CLOSED MANIPULATION SHOULDER;  Surgeon: Garald Balding, MD;  Location: [place];  Service: Orthopedics;  Laterality: Right;   TUBAL LIGATION     UPPER GASTROINTESTINAL ENDOSCOPY  10/06/2020   Patient Active Problem List   Diagnosis Date Noted   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 09/21/2021   Chronic left shoulder pain 09/21/2021   Sinus tachycardia 06/23/2021   Fatty infiltration of liver 07/26/2020   Diarrhea, functional 06/22/2020   Uveitis, intermediate, bilateral 09/07/2019   Generalized edema 06/13/2019   Abnormal electrocardiogram (ECG) (EKG) 06/07/2019   Hospital discharge follow-up 06/07/2019   Postviral fatigue syndrome 05/30/2019  Multinodular goiter 12/05/2017   Multiple thyroid nodules 12/05/2017   Microalbuminuria due to type 2 diabetes mellitus ([place]) 11/26/2017   Encounter for preventive health examination 11/26/2017   Menopause syndrome 06/10/2017   Adverse reaction to vaccine, sequela 03/25/2017   Palpitations 02/27/2017   PAC (premature atrial contraction) 02/27/2017   PVC's (premature ventricular contractions) 02/27/2017   PSVT (paroxysmal supraventricular tachycardia) ([place]) 11/26/2016   Nontraumatic incomplete tear of right rotator cuff 07/14/2016   AC (acromioclavicular) arthritis 07/14/2016   Impingement syndrome of right shoulder 07/14/2016   Fibrocystic breast changes, right 05/21/2016   Menopausal symptoms 10/30/2015   Insomnia 10/16/2015   Snoring 07/26/2015   Chronic migraine w/o aura w/o status migrainosus, not  intractable 02/24/2015   History of concussion 07/21/2014   Hyperlipidemia associated with type 2 diabetes mellitus ([place]) 06/13/2014   Vitamin D deficiency 05/07/2013   Chronic migraine 11/20/2012   S/P Total Abdominal Hysterectomy and Left Salpingo-oophorectomy 10/20/2011   Headache around the eyes    Uncontrolled type 2 diabetes mellitus 07/20/2011   Obesity (BMI 30-39.9) 07/20/2011   Essential hypertension 07/20/2011    REFERRING DIAG: traumatic incomplete tear of R rotator cuff/migraine/cervicalgia   THERAPY DIAG:  Chronic right shoulder pain  Stiffness of right shoulder, not elsewhere classified  Muscle weakness (generalized)  Rationale for Evaluation and Treatment Rehabilitation  PERTINENT HISTORY: Pt is a pleasant 51 y/o female presenting to PT due to chronic migraine headaches with cervical and bilateral shoulder pain. Pt has headaches every day. Pt reports onset of migraines at [age] years old where she had to seek emergency care due to severity of migraines. Pt reports COVID19 illness in 2020 and that migraines have been different since, including n/t that can occur on one side of her face (pt being followed by neurologist). Pt reports she has tried various medications, muscle relaxers to control migraines and related pain but that nothing has worked well yet. Other symptoms with her migraines include motion sickness, nausea, spinning sensation with her eyes closed, light and noise sensitivity, heightened sensitivity to touch on her face and scalp. 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 Pt with current R shoulder pain (reports has "possibly re-torn" R RTC), chronic R restrictions since 2020-2021 of no lifting greater than 10-15 lbs, no reaching behind, no reaching above her head. Hx of R RTC  repair 2018 per chart with dx of R shoulder adhesive capsulitis. Pt currently being treated for adhesive capsulitis of L shoulder..  Per chart other PMH significant for PMH: anxiety (claustrophobic), back pain, asthma, biceps tendonosis of R shoulder, DM, dyspnea, L frozen shouler (steroid injection 10/05/21), hx of concussion, headache disorder, HTN, HTN retinopathy, IBS, joint pain, leg edema, palpitation, R shoulder impingement syndrome, vitamin D deficiency, uveitis, paroxysmal supraventricular tachycardia, shoulder arthroscopy with subacromial decompression and mini open RTC repair, open bicep tenodesis  PRECAUTIONS: R shoulder; permanent restrictions. Do not lift more than 15lb.  Unless otherwise stated, all objective data taken from initial evaluation for shoulder    AROM 11/25/21 Right 11/25/21 Left 11/25/21  Shoulder Flexion 41 *  120  Shoulder Abduction 72* 125  ER 2 * WFL  IR 8* WFL  Elbow flexion  40* WFL  Elbow extension [facility] WFL   painful   SUBJECTIVE: Pt reports no changes in her pain, but  no stabbing pain since previous session. Pt reports a mild migraine recently. She reports nausea with some of her medications.   PAIN:  Are you having pain? Yes: NPRS scale: present but not rated/10 Pain location: R shoulder Pain description: sore, occasional sharp stabbing Aggravating factors: hanging her shoulder,driving  Relieving factors: supporting arm   TODAY'S TREATMENT:  12/27/2021 Elastogel donned to RUE while pt performs the following  UE Ranger 20x laterally, 20x FWD/BCKWD, CW/CC 10x each direction. Pt reports some discomfort with ER and ABD combined movements.   Seated shrugs 12x, 10x with mirror as cue. Pt reports fatiguing. Does exhibit modest improvement in activation with reps  Ulnar nerve glide 6x. Discontinued due to spasm felt in delt  Seated scapular squeezes 10x with 3 sec holds. Provided pillow under RUE for support   Table and towel supported RUE isometrics  against wall: R ER, IR, flexion, ext 10x for each with 5 sec holds. Improvement in comfort with exercise with addition of table for support.  Seated shrugs 10x improved coordination of movement noted  Shoulder circles 10x CW/CC. Reports coordination of movement feels challenging  Recommends possible use of RUE sling for short-term use to allow for pain decrease   PATIENT EDUCATION: Education details: exercise technique, body mechanics, recommendations Person educated: Patient Education method: Explanation, Demonstration, and Verbal cues Education comprehension: verbalized understanding, returned demonstration, verbal cues required, and needs further education   HOME EXERCISE PROGRAM:  12/27/2021: no updates  Theraputty for grip strength    PT Short Term Goals       PT SHORT TERM GOAL #1   Title Patient will be independent in home exercise program to improve strength/mobility for better functional independence with ADLs.    Baseline 5/15: initiated; 7/19: reports been doing them, really likes the ulnar nerve glide   Time 6    Period Weeks    Status On-going   Target Date 11/30/21    PT SHORT TERM GOAL #2  Title Patient will report a worst pain of 5/10 on VAS in  R shoulder to improve tolerance with ADLs and reduced symptoms with activities  Baseline 6/22: 8/10; 7/19: 6/10   Time 6   Period Weeks   Status On-going   Target Date 11/30/21             PT Long Term Goals       PT LONG TERM GOAL #1   Title Patient will increase FOTO score to equal to or greater than 60  to demonstrate statistically significant improvement in mobility and quality of life.    Baseline  7/19: 40   Time 12    Period Weeks    Status Goal Revised (now using Shoulder FOTO)   Target Date 01/10/22      PT LONG TERM GOAL #2   Title Patient will report a worst headache pain of 3/10 to improve tolerance with ADLs and reduced symptoms with activities.    Baseline 5/15: worst pain 8/10 (with  medication); 7/19: 6/10   Time 12    Period Weeks    Status On-going    Target Date 01/10/22      PT LONG TERM GOAL #3   Title Patient will increase UE gross strength to 4+/5 as to improve functional strength for increased ADL ability and ability to perform work duties.    Baseline 5/15: Grossly 4/5 BUEs and pain limited; 7/19: unable to test due to pain   Time 12    Period Weeks  Status On-going    Target Date 01/10/22      PT LONG TERM GOAL #4   Title The pt will show at least a 10 degree improvement with cervical L lateral flexion, extension, flexion, and bilateral rotatoin.    Baseline 5/16: Ext. 15 deg.   Flex. 23 deg.   Lateral flex: 20 deg. L  Rotation: 20-23 deg Bilat.; 7/19: L rotation 20 deg, Ext 17 deg, flex 25 deg, lat flex: 21 deg   Time 12    Period Weeks    Status On-going    Target Date 01/10/22    PT LONG TERM GOAL #5  Title Patient will report a worst pain of 3/10 on VAS in R shoulder to improve tolerance with ADLs and reduced symptoms with activities.   Baseline 6/22: 8/10; 7/19: 6/10  Time 12   Period Weeks   Status On-going   Target Date 01/10/22     PT LONG TERM GOAL #6  Title Patient will demonstrate adequate shoulder ROM and strength to be able to shave and dress independently with pain less than 3/10.  Baseline 6/22: flexion 41, abduction 72; unable to test strength; 7/19: flex 80 deg, abd 55 deg painful, unable to test strength  Time 12   Period Weeks   Status New   Target Date 01/10/22     PT LONG TERM GOAL #7  Title  Patient will decrease Quick DASH score by > 8 points (71.5%)demonstrating reduced self-reported upper extremity disability.  Baseline 6/22: 79.5%; 7/19: 65.9%  Time 12   Period Weeks   Status Achieved   Target Date 01/10/22             Plan     Clinical Impression Statement Pt shows progress today with minimal pain increase with therex. This is in part likely due to modifications made to interventions, such as using a  table to support RUE while performing isometrics. PT recommends that pt could likely benefit from short-term use of a sling to allow for decrease in pain when not performing her exercises. The pt will benefit from further skilled PT to improve pain, ROM and strength to increase QOL and ease with ADLs.       Personal Factors and Comorbidities Comorbidity 3+;Sex;Time since onset of injury/illness/exacerbation    Comorbidities PMH: anxiety (claustrophobic), back pain, asthma, biceps tendonosis of R shoulder, DM, dyspnea, L frozen shouler (steroid injection 10/05/21), hx of concussion, headache disorder, HTN, HTN retinopathy, IBS, joint pain, leg edema, palpitation, R shoulder impingement syndrome, vitamin D difictiency, uveitis, paroxysmal supraventricular tachycardia, shoulder arthroscopy with subacromial decompression and mini open RTC repair, open bicep tenodesis    Examination-Activity Limitations Locomotion Level;Caring for Others;Other;Reach Overhead;Carry   with increased pain pt reports must rest, limits ability to perform job duties/ADLs   Examination-Participation Restrictions Occupation;Community Activity;Shop;Driving;Cleaning;Yard Work;Other;Laundry;Meal Prep   stiffness of cervical spine limits ability to look over shoulder with driving   Stability/Clinical Decision Making Evolving/Moderate complexity    Rehab Potential Good    PT Frequency 2x / week    PT Duration 12 weeks    PT Treatment/Interventions ADLs/Self Care Home Management;Biofeedback;Canalith Repostioning;Cryotherapy;Electrical Stimulation;Moist Heat;Traction;Ultrasound;DME Instruction;Gait training;Stair training;Functional mobility training;Therapeutic exercise;Balance training;Neuromuscular re-education;Patient/family education;Orthotic Fit/Training;Therapeutic activities;Manual techniques;Scar mobilization;Manual lymph drainage;Compression bandaging;Passive range of motion;Dry needling;Energy  conservation;Splinting;Taping;Vestibular;Spinal Manipulations;Joint Manipulations;Iontophoresis '4mg'$ /ml Dexamethasone    PT Next Visit Plan E stim, ROM, isometrics    PT Home Exercise Plan Access Code: [email or web address];5/32 Ulnar nerve glide added to HEP to be performed daily  Consulted and Agree with Plan of Care Patient            Ricard Dillon PT, DPT  12/27/2021, 5:00 PM

## 2021-12-28 ENCOUNTER — Other Ambulatory Visit: Payer: Self-pay | Admitting: Internal Medicine

## 2021-12-28 ENCOUNTER — Other Ambulatory Visit: Payer: Self-pay

## 2021-12-28 MED ORDER — DEXCOM G7 SENSOR MISC
3.0000 | 4 refills | Status: DC
  Filled 2021-12-28: qty 3, 30d supply, fill #0
  Filled 2022-02-02: qty 3, 30d supply, fill #1
  Filled 2022-03-22: qty 3, 30d supply, fill #2
  Filled 2022-04-21: qty 3, 30d supply, fill #3
  Filled 2022-05-19: qty 3, 30d supply, fill #4
  Filled 2022-06-16: qty 3, 30d supply, fill #5
  Filled 2022-07-14: qty 3, 30d supply, fill #6
  Filled 2022-08-26: qty 3, 30d supply, fill #7
  Filled 2022-08-26: qty 3, 30d supply, fill #0
  Filled 2022-09-19: qty 3, 30d supply, fill #1
  Filled 2022-10-20: qty 3, 30d supply, fill #2
  Filled 2022-11-28: qty 3, 30d supply, fill #3

## 2021-12-29 ENCOUNTER — Ambulatory Visit: Payer: 59

## 2021-12-29 DIAGNOSIS — M25611 Stiffness of right shoulder, not elsewhere classified: Secondary | ICD-10-CM | POA: Diagnosis not present

## 2021-12-29 DIAGNOSIS — M6281 Muscle weakness (generalized): Secondary | ICD-10-CM

## 2021-12-29 DIAGNOSIS — M542 Cervicalgia: Secondary | ICD-10-CM | POA: Diagnosis not present

## 2021-12-29 DIAGNOSIS — M25511 Pain in right shoulder: Secondary | ICD-10-CM | POA: Diagnosis not present

## 2021-12-29 DIAGNOSIS — G8929 Other chronic pain: Secondary | ICD-10-CM | POA: Diagnosis not present

## 2021-12-29 NOTE — Therapy (Signed)
OUTPATIENT PHYSICAL THERAPY TREATMENT NOTE   Patient Name: [name] MRN: [id number] DOB:24-Mar-1971, 51 y.o., female Today's Date: 12/29/2021  PCP: Crecencio Mc MD  REFERRING PROVIDER: Vanetta Mulders MD   PT End of Session - 12/29/21 1656     Visit Number 12    Number of Visits 25    Date for PT Re-Evaluation 01/10/22    Authorization Time Period 10/18/21-01/10/22    PT Start Time 2426    PT Stop Time 8341    PT Time Calculation (min) 41 min    Activity Tolerance Patient limited by pain    Behavior During Therapy [facility] for tasks assessed/performed               Past Medical History:  Diagnosis Date   Allergy    Anemia    Anxiety    claustrophobic   Asthma    Back pain    Biceps tendonosis of right shoulder    Cataract    Mixed OU   COVID-19    covid PNA hospitalized 2020, 06/06/21   Diabetes mellitus 2011   did not start metforfin, losing weight   Dyspnea    Fatty liver    Food allergy    Frozen shoulder    left, had steroid injection 10/05/21   Headache disorder    History of concussion    HLD (hyperlipidemia)    Hypertension    Hypertensive retinopathy    OU   IBS (irritable bowel syndrome)    Infertility, female    Joint pain    Lactose intolerance    Leg edema    Migraines    Palpitation    Post-menopausal    Seborrheic dermatitis    Shoulder impingement syndrome, right    Uveitis    [place] eye est in 2020 seen specialist in [place] [facility] last seen 11 or 05/2020   Vitamin D deficiency    Past Surgical History:  Procedure Laterality Date   ABDOMINAL HYSTERECTOMY  2006   heavy menses, endometriosis, l oophrectomy   BACK SURGERY     BREAST BIOPSY Right 2018   benign   BREAST EXCISIONAL BIOPSY Right 2003   benign   BREAST EXCISIONAL BIOPSY Right 1999   benign   BREAST SURGERY     right breast x 2 , benign   COLONOSCOPY  10/06/2020   HERNIA REPAIR  2003   left inguinal    LEFT OOPHORECTOMY     SHOULDER ARTHROSCOPY WITH  SUBACROMIAL DECOMPRESSION AND OPEN ROTATOR C Right 07/14/2016   Procedure: RIGHT SHOULDER ARTHROSCOPY WITH SUBACROMIAL DECOMPRESSION, DISTAL CLAVICLE RESECTION AND MINI OPEN ROTATOR CUFF REPAIR, OPEN BICEP TENDODESIS;  Surgeon: Garald Balding, MD;  Location: [place];  Service: Orthopedics;  Laterality: Right;   SHOULDER CLOSED REDUCTION Right 09/08/2016   Procedure: RIGHT CLOSED MANIPULATION SHOULDER;  Surgeon: Garald Balding, MD;  Location: [place];  Service: Orthopedics;  Laterality: Right;   TUBAL LIGATION     UPPER GASTROINTESTINAL ENDOSCOPY  10/06/2020   Patient Active Problem List   Diagnosis Date Noted   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 09/21/2021   Chronic left shoulder pain 09/21/2021   Sinus tachycardia 06/23/2021   Fatty infiltration of liver 07/26/2020   Diarrhea, functional 06/22/2020   Uveitis, intermediate, bilateral 09/07/2019   Generalized edema 06/13/2019   Abnormal electrocardiogram (ECG) (EKG) 06/07/2019   Hospital discharge follow-up 06/07/2019   Postviral fatigue syndrome 05/30/2019  Multinodular goiter 12/05/2017   Multiple thyroid nodules 12/05/2017   Microalbuminuria due to type 2 diabetes mellitus ([place]) 11/26/2017   Encounter for preventive health examination 11/26/2017   Menopause syndrome 06/10/2017   Adverse reaction to vaccine, sequela 03/25/2017   Palpitations 02/27/2017   PAC (premature atrial contraction) 02/27/2017   PVC's (premature ventricular contractions) 02/27/2017   PSVT (paroxysmal supraventricular tachycardia) ([place]) 11/26/2016   Nontraumatic incomplete tear of right rotator cuff 07/14/2016   AC (acromioclavicular) arthritis 07/14/2016   Impingement syndrome of right shoulder 07/14/2016   Fibrocystic breast changes, right 05/21/2016   Menopausal symptoms 10/30/2015   Insomnia 10/16/2015   Snoring 07/26/2015   Chronic migraine w/o aura w/o status migrainosus, not  intractable 02/24/2015   History of concussion 07/21/2014   Hyperlipidemia associated with type 2 diabetes mellitus ([place]) 06/13/2014   Vitamin D deficiency 05/07/2013   Chronic migraine 11/20/2012   S/P Total Abdominal Hysterectomy and Left Salpingo-oophorectomy 10/20/2011   Headache around the eyes    Uncontrolled type 2 diabetes mellitus 07/20/2011   Obesity (BMI 30-39.9) 07/20/2011   Essential hypertension 07/20/2011    REFERRING DIAG: traumatic incomplete tear of R rotator cuff/migraine/cervicalgia   THERAPY DIAG:  Cervicalgia  Chronic right shoulder pain  Stiffness of right shoulder, not elsewhere classified  Muscle weakness (generalized)  Rationale for Evaluation and Treatment Rehabilitation  PERTINENT HISTORY: Pt is a pleasant 51 y/o female presenting to PT due to chronic migraine headaches with cervical and bilateral shoulder pain. Pt has headaches every day. Pt reports onset of migraines at [age] years old where she had to seek emergency care due to severity of migraines. Pt reports COVID19 illness in 2020 and that migraines have been different since, including n/t that can occur on one side of her face (pt being followed by neurologist). Pt reports she has tried various medications, muscle relaxers to control migraines and related pain but that nothing has worked well yet. Other symptoms with her migraines include motion sickness, nausea, spinning sensation with her eyes closed, light and noise sensitivity, heightened sensitivity to touch on her face and scalp. 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 Pt with current R shoulder pain (reports has "possibly re-torn" R RTC), chronic R restrictions since 2020-2021 of no lifting greater than 10-15 lbs, no reaching behind, no reaching above her head.  Hx of R RTC repair 2018 per chart with dx of R shoulder adhesive capsulitis. Pt currently being treated for adhesive capsulitis of L shoulder..  Per chart other PMH significant for PMH: anxiety (claustrophobic), back pain, asthma, biceps tendonosis of R shoulder, DM, dyspnea, L frozen shouler (steroid injection 10/05/21), hx of concussion, headache disorder, HTN, HTN retinopathy, IBS, joint pain, leg edema, palpitation, R shoulder impingement syndrome, vitamin D deficiency, uveitis, paroxysmal supraventricular tachycardia, shoulder arthroscopy with subacromial decompression and mini open RTC repair, open bicep tenodesis  PRECAUTIONS: R shoulder; permanent restrictions. Do not lift more than 15lb.  Unless otherwise stated, all objective data taken from initial evaluation for shoulder    AROM 11/25/21 Right 11/25/21 Left 11/25/21  Shoulder Flexion 41 *  120  Shoulder Abduction 72* 125  ER 2 * WFL  IR 8* WFL  Elbow flexion  40* WFL  Elbow extension [facility] WFL   painful   SUBJECTIVE: Pt has headache currently 6/10, and  3/10 shoulder pain. She has been wearing RUE sling on and off since last appointment.   PAIN:  Are you having pain? Yes: NPRS scale: 3/10 Pain location: R shoulder Pain description: sore, occasional sharp stabbing Aggravating factors: hanging her shoulder,driving  Relieving factors: supporting arm   TODAY'S TREATMENT:  12/29/2021  Elastogel donned to RUE while pt performs the following  UE Ranger 15x laterally, 15x FWD/BCKWD, CW/CC 10x each direction.  Gentle cervical AROM: rotation, lateral flexion, flex/ext 10x for each   Cervical rotation stretch 30 sec each side. Significantly more range on L side than R  Seated scapular squeezes 10x  Upper trap stretch 30 sec each side  Ulnar nerve glide RUE supported at elbow 12x  Latex free YTB rows 20x (significant slack on band, towel roll at side), able to perform pain free  Latex free YTB rows to neutral 10x pain  free  Seated shrugs 10x with mirror as cue. Reports less fatigue  Table and towel supported RUE isometrics against wall: R ext, flex, ER, IR 10x for each with 5 sec holds.  Able to perform without increased pain, pt able to modify intensity of contraction for comfort without cues   Shoulder circles 10x CW. Mirror for cue. Continues to rate as challenging. Attempted CC but limited to one rep due to sharp pain, likely impacted by fatigue.   PATIENT EDUCATION: Education details: exercise technique, body mechanics Person educated: Patient Education method: Explanation, Demonstration, and Verbal cues Education comprehension: verbalized understanding, returned demonstration, verbal cues required, and needs further education   HOME EXERCISE PROGRAM:  12/29/2021: no updates  Theraputty for grip strength    PT Short Term Goals       PT SHORT TERM GOAL #1   Title Patient will be independent in home exercise program to improve strength/mobility for better functional independence with ADLs.    Baseline 5/15: initiated; 7/19: reports been doing them, really likes the ulnar nerve glide   Time 6    Period Weeks    Status On-going   Target Date 11/30/21    PT SHORT TERM GOAL #2  Title Patient will report a worst pain of 5/10 on VAS in  R shoulder to improve tolerance with ADLs and reduced symptoms with activities  Baseline 6/22: 8/10; 7/19: 6/10   Time 6   Period Weeks   Status On-going   Target Date 11/30/21             PT Long Term Goals       PT LONG TERM GOAL #1   Title Patient will increase FOTO score to equal to or greater than 60  to demonstrate statistically significant improvement in mobility and quality of life.    Baseline  7/19: 40   Time 12    Period Weeks    Status Goal Revised (now using Shoulder FOTO)   Target Date 01/10/22      PT LONG TERM GOAL #2   Title Patient will report a worst headache pain of 3/10 to improve tolerance with ADLs and reduced symptoms  with activities.    Baseline 5/15: worst pain 8/10 (with medication); 7/19: 6/10   Time 12    Period Weeks    Status On-going    Target Date 01/10/22      PT LONG TERM GOAL #3   Title Patient will increase UE gross strength to 4+/5 as to improve functional strength for increased ADL ability and ability to perform work duties.  Baseline 5/15: Grossly 4/5 BUEs and pain limited; 7/19: unable to test due to pain   Time 12    Period Weeks    Status On-going    Target Date 01/10/22      PT LONG TERM GOAL #4   Title The pt will show at least a 10 degree improvement with cervical L lateral flexion, extension, flexion, and bilateral rotatoin.    Baseline 5/16: Ext. 15 deg.   Flex. 23 deg.   Lateral flex: 20 deg. L  Rotation: 20-23 deg Bilat.; 7/19: L rotation 20 deg, Ext 17 deg, flex 25 deg, lat flex: 21 deg   Time 12    Period Weeks    Status On-going    Target Date 01/10/22    PT LONG TERM GOAL #5  Title Patient will report a worst pain of 3/10 on VAS in R shoulder to improve tolerance with ADLs and reduced symptoms with activities.   Baseline 6/22: 8/10; 7/19: 6/10  Time 12   Period Weeks   Status On-going   Target Date 01/10/22     PT LONG TERM GOAL #6  Title Patient will demonstrate adequate shoulder ROM and strength to be able to shave and dress independently with pain less than 3/10.  Baseline 6/22: flexion 41, abduction 72; unable to test strength; 7/19: flex 80 deg, abd 55 deg painful, unable to test strength  Time 12   Period Weeks   Status New   Target Date 01/10/22     PT LONG TERM GOAL #7  Title  Patient will decrease Quick DASH score by > 8 points (71.5%)demonstrating reduced self-reported upper extremity disability.  Baseline 6/22: 79.5%; 7/19: 65.9%  Time 12   Period Weeks   Status Achieved   Target Date 01/10/22             Plan     Clinical Impression Statement Pt able to advance strengthening today with introduction of latex-free YTB. Pt did perform  with increased slack on band for gentle introduction to resisted strengthening and was able to complete reps without increase in pain. Pt without pain increase until very end of session with attempted CC shoulder circles. Immediately discontinued intervention. Pain increase likely influenced by muscular fatigue. PT recommended resting RUE following appointment and wearing her sling for support and pain relief. The pt will benefit from further skilled PT to improve pain, ROM and strength to increase QOL and ease with ADLs.       Personal Factors and Comorbidities Comorbidity 3+;Sex;Time since onset of injury/illness/exacerbation    Comorbidities PMH: anxiety (claustrophobic), back pain, asthma, biceps tendonosis of R shoulder, DM, dyspnea, L frozen shouler (steroid injection 10/05/21), hx of concussion, headache disorder, HTN, HTN retinopathy, IBS, joint pain, leg edema, palpitation, R shoulder impingement syndrome, vitamin D difictiency, uveitis, paroxysmal supraventricular tachycardia, shoulder arthroscopy with subacromial decompression and mini open RTC repair, open bicep tenodesis    Examination-Activity Limitations Locomotion Level;Caring for Others;Other;Reach Overhead;Carry   with increased pain pt reports must rest, limits ability to perform job duties/ADLs   Examination-Participation Restrictions Occupation;Community Activity;Shop;Driving;Cleaning;Yard Work;Other;Laundry;Meal Prep   stiffness of cervical spine limits ability to look over shoulder with driving   Stability/Clinical Decision Making Evolving/Moderate complexity    Rehab Potential Good    PT Frequency 2x / week    PT Duration 12 weeks    PT Treatment/Interventions ADLs/Self Care Home Management;Biofeedback;Canalith Repostioning;Cryotherapy;Electrical Stimulation;Moist Heat;Traction;Ultrasound;DME Instruction;Gait training;Stair training;Functional mobility training;Therapeutic exercise;Balance training;Neuromuscular  re-education;Patient/family education;Orthotic Fit/Training;Therapeutic activities;Manual techniques;Scar mobilization;Manual lymph  drainage;Compression bandaging;Passive range of motion;Dry needling;Energy conservation;Splinting;Taping;Vestibular;Spinal Manipulations;Joint Manipulations;Iontophoresis '4mg'$ /ml Dexamethasone    PT Next Visit Plan E stim, ROM, isometrics    PT Home Exercise Plan Access Code: [email or web address];[email or web address] Ulnar nerve glide added to HEP to be performed daily    Consulted and Agree with Plan of Care Patient            Ricard Dillon PT, DPT  12/29/2021, 4:57 PM

## 2022-01-03 ENCOUNTER — Ambulatory Visit: Payer: 59 | Admitting: Physical Therapy

## 2022-01-04 ENCOUNTER — Encounter: Payer: Self-pay | Admitting: Internal Medicine

## 2022-01-04 NOTE — Telephone Encounter (Signed)
I called patient & she stated that her prescription is not active. She said it had expired in 2021. She has neither & neither is active. She is not sure what appears on your end. She does not feel she can wait to her a[appointment on the 22nd with Dr. Billey Gosling.

## 2022-01-04 NOTE — Telephone Encounter (Signed)
Attempted to call patient but VM was full unable to leave message.

## 2022-01-05 ENCOUNTER — Ambulatory Visit: Payer: 59

## 2022-01-07 ENCOUNTER — Telehealth: Payer: Self-pay | Admitting: Psychiatry

## 2022-01-07 NOTE — Telephone Encounter (Signed)
Pt reports she has had a migraine all week in addition to motion sickness, pt is asking for a call re: anything that could be done to help.

## 2022-01-10 ENCOUNTER — Ambulatory Visit: Payer: 59 | Attending: Psychiatry

## 2022-01-10 ENCOUNTER — Other Ambulatory Visit: Payer: Self-pay

## 2022-01-10 ENCOUNTER — Other Ambulatory Visit: Payer: Self-pay | Admitting: Psychiatry

## 2022-01-10 DIAGNOSIS — M25511 Pain in right shoulder: Secondary | ICD-10-CM | POA: Diagnosis not present

## 2022-01-10 DIAGNOSIS — M25611 Stiffness of right shoulder, not elsewhere classified: Secondary | ICD-10-CM | POA: Diagnosis not present

## 2022-01-10 DIAGNOSIS — G8929 Other chronic pain: Secondary | ICD-10-CM | POA: Diagnosis not present

## 2022-01-10 DIAGNOSIS — M542 Cervicalgia: Secondary | ICD-10-CM | POA: Diagnosis not present

## 2022-01-10 DIAGNOSIS — M6281 Muscle weakness (generalized): Secondary | ICD-10-CM | POA: Diagnosis not present

## 2022-01-10 MED ORDER — KETOROLAC TROMETHAMINE 10 MG PO TABS
10.0000 mg | ORAL_TABLET | Freq: Three times a day (TID) | ORAL | 0 refills | Status: AC
  Filled 2022-01-10: qty 15, 5d supply, fill #0

## 2022-01-10 NOTE — Therapy (Signed)
OUTPATIENT PHYSICAL THERAPY TREATMENT NOTE   Patient Name: [name] MRN: [id number] DOB:22-Feb-1971, 51 y.o., female Today's Date: 01/10/2022  PCP: Crecencio Mc MD  REFERRING PROVIDER: Vanetta Mulders MD   PT End of Session - 01/10/22 1656     Visit Number 13    Number of Visits 25    Date for PT Re-Evaluation 01/10/22    Authorization Time Period 10/18/21-01/10/22    PT Start Time 1620    PT Stop Time 1649    PT Time Calculation (min) 29 min    Activity Tolerance Patient limited by pain    Behavior During Therapy [facility] for tasks assessed/performed                Past Medical History:  Diagnosis Date   Allergy    Anemia    Anxiety    claustrophobic   Asthma    Back pain    Biceps tendonosis of right shoulder    Cataract    Mixed OU   COVID-19    covid PNA hospitalized 2020, 06/06/21   Diabetes mellitus 2011   did not start metforfin, losing weight   Dyspnea    Fatty liver    Food allergy    Frozen shoulder    left, had steroid injection 10/05/21   Headache disorder    History of concussion    HLD (hyperlipidemia)    Hypertension    Hypertensive retinopathy    OU   IBS (irritable bowel syndrome)    Infertility, female    Joint pain    Lactose intolerance    Leg edema    Migraines    Palpitation    Post-menopausal    Seborrheic dermatitis    Shoulder impingement syndrome, right    Uveitis    [place] [facility] est in 2020 seen specialist in [place] [facility] last seen 11 or 05/2020   Vitamin D deficiency    Past Surgical History:  Procedure Laterality Date   ABDOMINAL HYSTERECTOMY  2006   heavy menses, endometriosis, l oophrectomy   BACK SURGERY     BREAST BIOPSY Right 2018   benign   BREAST EXCISIONAL BIOPSY Right 2003   benign   BREAST EXCISIONAL BIOPSY Right 1999   benign   BREAST SURGERY     right breast x 2 , benign   COLONOSCOPY  10/06/2020   HERNIA REPAIR  2003   left inguinal    LEFT OOPHORECTOMY     SHOULDER ARTHROSCOPY WITH  SUBACROMIAL DECOMPRESSION AND OPEN ROTATOR C Right 07/14/2016   Procedure: RIGHT SHOULDER ARTHROSCOPY WITH SUBACROMIAL DECOMPRESSION, DISTAL CLAVICLE RESECTION AND MINI OPEN ROTATOR CUFF REPAIR, OPEN BICEP TENDODESIS;  Surgeon: Garald Balding, MD;  Location: [place];  Service: Orthopedics;  Laterality: Right;   SHOULDER CLOSED REDUCTION Right 09/08/2016   Procedure: RIGHT CLOSED MANIPULATION SHOULDER;  Surgeon: Garald Balding, MD;  Location: [place];  Service: Orthopedics;  Laterality: Right;   TUBAL LIGATION     UPPER GASTROINTESTINAL ENDOSCOPY  10/06/2020   Patient Active Problem List   Diagnosis Date Noted   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 09/21/2021   Chronic left shoulder pain 09/21/2021   Sinus tachycardia 06/23/2021   Fatty infiltration of liver 07/26/2020   Diarrhea, functional 06/22/2020   Uveitis, intermediate, bilateral 09/07/2019   Generalized edema 06/13/2019   Abnormal electrocardiogram (ECG) (EKG) 06/07/2019   Hospital discharge follow-up 06/07/2019   Postviral fatigue syndrome 05/30/2019  Multinodular goiter 12/05/2017   Multiple thyroid nodules 12/05/2017   Microalbuminuria due to type 2 diabetes mellitus ([place]) 11/26/2017   Encounter for preventive health examination 11/26/2017   Menopause syndrome 06/10/2017   Adverse reaction to vaccine, sequela 03/25/2017   Palpitations 02/27/2017   PAC (premature atrial contraction) 02/27/2017   PVC's (premature ventricular contractions) 02/27/2017   PSVT (paroxysmal supraventricular tachycardia) ([place]) 11/26/2016   Nontraumatic incomplete tear of right rotator cuff 07/14/2016   AC (acromioclavicular) arthritis 07/14/2016   Impingement syndrome of right shoulder 07/14/2016   Fibrocystic breast changes, right 05/21/2016   Menopausal symptoms 10/30/2015   Insomnia 10/16/2015   Snoring 07/26/2015   Chronic migraine w/o aura w/o status migrainosus, not  intractable 02/24/2015   History of concussion 07/21/2014   Hyperlipidemia associated with type 2 diabetes mellitus ([place]) 06/13/2014   Vitamin D deficiency 05/07/2013   Chronic migraine 11/20/2012   S/P Total Abdominal Hysterectomy and Left Salpingo-oophorectomy 10/20/2011   Headache around the eyes    Uncontrolled type 2 diabetes mellitus 07/20/2011   Obesity (BMI 30-39.9) 07/20/2011   Essential hypertension 07/20/2011    REFERRING DIAG: traumatic incomplete tear of R rotator cuff/migraine/cervicalgia   THERAPY DIAG:  Chronic right shoulder pain  Cervicalgia  Muscle weakness (generalized)  Rationale for Evaluation and Treatment Rehabilitation  PERTINENT HISTORY: Pt is a pleasant 51 y/o female presenting to PT due to chronic migraine headaches with cervical and bilateral shoulder pain. Pt has headaches every day. Pt reports onset of migraines at 62-58 years old where she had to seek emergency care due to severity of migraines. Pt reports COVID19 illness in 2020 and that migraines have been different since, including n/t that can occur on one side of her face (pt being followed by neurologist). Pt reports she has tried various medications, muscle relaxers to control migraines and related pain but that nothing has worked well yet. Other symptoms with her migraines include motion sickness, nausea, spinning sensation with her eyes closed, light and noise sensitivity, heightened sensitivity to touch on her face and scalp. 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 Pt with current R shoulder pain (reports has "possibly re-torn" R RTC), chronic R restrictions since 2020-2021 of no lifting greater than 10-15 lbs, no reaching behind, no reaching above her head. Hx of R RTC repair 2018 per chart with dx of R shoulder  adhesive capsulitis. Pt currently being treated for adhesive capsulitis of L shoulder..  Per chart other PMH significant for PMH: anxiety (claustrophobic), back pain, asthma, biceps tendonosis of R shoulder, DM, dyspnea, L frozen shouler (steroid injection 10/05/21), hx of concussion, headache disorder, HTN, HTN retinopathy, IBS, joint pain, leg edema, palpitation, R shoulder impingement syndrome, vitamin D deficiency, uveitis, paroxysmal supraventricular tachycardia, shoulder arthroscopy with subacromial decompression and mini open RTC repair, open bicep tenodesis  PRECAUTIONS: R shoulder; permanent restrictions. Do not lift more than 15lb.  Unless otherwise stated, all objective data taken from initial evaluation for shoulder    AROM 11/25/21 Right 11/25/21 Left 11/25/21  Shoulder Flexion 41 *  120  Shoulder Abduction 72* 125  ER 2 * WFL  IR 8* WFL  Elbow flexion  40* WFL  Elbow extension [facility] WFL   painful   SUBJECTIVE: Pt has had constant migraine for several days, has reached out to her physician.  Rates headache currently 4/10 and 5/10 R shoulder pain. She has had to take off from work due to pain. Has been wearing RUE sling on and off since last appointment. Pt reports pain down all through her jaw and neck, reports tingling. Was able to have a deep tissue massage, helped her pain a little. Reports her dentist has recommended she get a mouth guard.    PAIN:  Are you having pain? Yes: NPRS scale: 4/10 Pain location: headache Pain description: sharp  Aggravating factors:   Relieving factors:     TODAY'S TREATMENT:  01/10/2022  Elastogel donned to RUE while pt performs the following  UE Ranger 20x laterally, 20x FWD/BCKWD, CW/CC 10x each direction.   Shrugs 12x  Median nerve glide 10x  Ulnar nerve glide 15x  Seated scapular squeezes 6x with 3 sec holds  Gentle cervical AROM: rotation, lateral flexion, flex/ext 10x for each   Cervical rotation stretch 30 sec each side.    Upper trap stretch 30 sec each side  Seated UE supported latex free YTB serratus punches 12x B. Reports increased feeling of tightness in R shoulder   Table and towel supported RUE isometrics against wall: R shoulder ext, flex, ER, IR 6-10x for each with 5 sec holds.  Reps modified to reduce discomfort with intervention.   Attempted pendulum R shoulder, but discontinued due to reports of feeling a sharp pull in R shoulder. Pt reports fatigue of R shoulder musculature.  Session ended early today to prevent significant increase in shoulder pain.    PATIENT EDUCATION: Education details: exercise technique, body mechanics Person educated: Patient Education method: Explanation, Demonstration, and Verbal cues Education comprehension: verbalized understanding, returned demonstration, verbal cues required, and needs further education   HOME EXERCISE PROGRAM:  12/29/2021: no updates  Theraputty for grip strength    PT Short Term Goals       PT SHORT TERM GOAL #1   Title Patient will be independent in home exercise program to improve strength/mobility for better functional independence with ADLs.    Baseline 5/15: initiated; 7/19: reports been doing them, really likes the ulnar nerve glide   Time 6    Period Weeks    Status On-going   Target Date 11/30/21    PT SHORT TERM GOAL #2  Title Patient will report a worst pain of 5/10 on VAS in  R shoulder to improve tolerance with ADLs and reduced symptoms with activities  Baseline 6/22: 8/10; 7/19: 6/10   Time 6   Period Weeks   Status On-going   Target Date 11/30/21             PT Long Term Goals       PT LONG TERM GOAL #1   Title Patient will increase FOTO score to equal to or greater than 60  to demonstrate statistically significant improvement in mobility and quality of life.    Baseline  7/19: 40   Time 12    Period Weeks    Status Goal Revised (now using Shoulder FOTO)   Target Date 01/10/22      PT LONG TERM GOAL  #2   Title Patient will report a worst headache pain of 3/10 to improve tolerance with ADLs and reduced symptoms with activities.    Baseline 5/15: worst pain 8/10 (with medication); 7/19: 6/10   Time 12    Period Weeks    Status On-going    Target Date 01/10/22      PT LONG TERM GOAL #  3   Title Patient will increase UE gross strength to 4+/5 as to improve functional strength for increased ADL ability and ability to perform work duties.    Baseline 5/15: Grossly 4/5 BUEs and pain limited; 7/19: unable to test due to pain   Time 12    Period Weeks    Status On-going    Target Date 01/10/22      PT LONG TERM GOAL #4   Title The pt will show at least a 10 degree improvement with cervical L lateral flexion, extension, flexion, and bilateral rotatoin.    Baseline 5/16: Ext. 15 deg.   Flex. 23 deg.   Lateral flex: 20 deg. L  Rotation: 20-23 deg Bilat.; 7/19: L rotation 20 deg, Ext 17 deg, flex 25 deg, lat flex: 21 deg   Time 12    Period Weeks    Status On-going    Target Date 01/10/22    PT LONG TERM GOAL #5  Title Patient will report a worst pain of 3/10 on VAS in R shoulder to improve tolerance with ADLs and reduced symptoms with activities.   Baseline 6/22: 8/10; 7/19: 6/10  Time 12   Period Weeks   Status On-going   Target Date 01/10/22     PT LONG TERM GOAL #6  Title Patient will demonstrate adequate shoulder ROM and strength to be able to shave and dress independently with pain less than 3/10.  Baseline 6/22: flexion 41, abduction 72; unable to test strength; 7/19: flex 80 deg, abd 55 deg painful, unable to test strength  Time 12   Period Weeks   Status New   Target Date 01/10/22     PT LONG TERM GOAL #7  Title  Patient will decrease Quick DASH score by > 8 points (71.5%)demonstrating reduced self-reported upper extremity disability.  Baseline 6/22: 79.5%; 7/19: 65.9%  Time 12   Period Weeks   Status Achieved   Target Date 01/10/22             Plan     Clinical  Impression Statement Interventions modified throughout session to reduce pain/discomfort of R shoulder. Pt noted to have quick onset of fatigue felt throughout R shoulder musculature with majority of therex. To reduce likelihood of pain increase, session ended early to allow for appropriate dose of interventions. The pt will benefit from further skilled PT to improve pain, ROM and strength to increase QOL and ease with ADLs.       Personal Factors and Comorbidities Comorbidity 3+;Sex;Time since onset of injury/illness/exacerbation    Comorbidities PMH: anxiety (claustrophobic), back pain, asthma, biceps tendonosis of R shoulder, DM, dyspnea, L frozen shouler (steroid injection 10/05/21), hx of concussion, headache disorder, HTN, HTN retinopathy, IBS, joint pain, leg edema, palpitation, R shoulder impingement syndrome, vitamin D difictiency, uveitis, paroxysmal supraventricular tachycardia, shoulder arthroscopy with subacromial decompression and mini open RTC repair, open bicep tenodesis    Examination-Activity Limitations Locomotion Level;Caring for Others;Other;Reach Overhead;Carry   with increased pain pt reports must rest, limits ability to perform job duties/ADLs   Examination-Participation Restrictions Occupation;Community Activity;Shop;Driving;Cleaning;Yard Work;Other;Laundry;Meal Prep   stiffness of cervical spine limits ability to look over shoulder with driving   Stability/Clinical Decision Making Evolving/Moderate complexity    Rehab Potential Good    PT Frequency 2x / week    PT Duration 12 weeks    PT Treatment/Interventions ADLs/Self Care Home Management;Biofeedback;Canalith Repostioning;Cryotherapy;Electrical Stimulation;Moist Heat;Traction;Ultrasound;DME Instruction;Gait training;Stair training;Functional mobility training;Therapeutic exercise;Balance training;Neuromuscular re-education;Patient/family education;Orthotic Fit/Training;Therapeutic activities;Manual techniques;Scar  mobilization;Manual lymph drainage;Compression  bandaging;Passive range of motion;Dry needling;Energy conservation;Splinting;Taping;Vestibular;Spinal Manipulations;Joint Manipulations;Iontophoresis '4mg'$ /ml Dexamethasone    PT Next Visit Plan E stim, ROM, isometrics, gentle theraband strengthening as able, continue plan   PT Home Exercise Plan Access Code: [email or web address];[email or web address] Ulnar nerve glide added to HEP to be performed daily    Consulted and Agree with Plan of Care Patient            Ricard Dillon PT, DPT  01/10/2022, 5:04 PM

## 2022-01-10 NOTE — Telephone Encounter (Signed)
I sent a 10 day course of toradol to her pharmacy to help break her headache. She should not take any other NSAIDs like ibuprofen or Mobic while taking this

## 2022-01-10 NOTE — Telephone Encounter (Signed)
Any suggestions>?

## 2022-01-12 ENCOUNTER — Ambulatory Visit: Payer: 59

## 2022-01-12 DIAGNOSIS — G8929 Other chronic pain: Secondary | ICD-10-CM | POA: Diagnosis not present

## 2022-01-12 DIAGNOSIS — M542 Cervicalgia: Secondary | ICD-10-CM

## 2022-01-12 DIAGNOSIS — M25611 Stiffness of right shoulder, not elsewhere classified: Secondary | ICD-10-CM | POA: Diagnosis not present

## 2022-01-12 DIAGNOSIS — M6281 Muscle weakness (generalized): Secondary | ICD-10-CM | POA: Diagnosis not present

## 2022-01-12 DIAGNOSIS — M25511 Pain in right shoulder: Secondary | ICD-10-CM | POA: Diagnosis not present

## 2022-01-12 NOTE — Therapy (Addendum)
OUTPATIENT PHYSICAL THERAPY TREATMENT NOTE/RECERT   Patient Name: [name] MRN: [id number] DOB:1971-04-10, 51 y.o., female Today's Date: 01/24/2022  PCP: Crecencio Mc MD  REFERRING PROVIDER: Vanetta Mulders MD   PT End of Session - 01/24/22 1647     Visit Number 14    Number of Visits 25    Date for PT Re-Evaluation 04/04/22    Authorization Time Period 10/18/21-01/10/22    PT Start Time 1622    PT Stop Time 1658    PT Time Calculation (min) 36 min    Activity Tolerance Patient limited by pain    Behavior During Therapy [facility] for tasks assessed/performed                 Past Medical History:  Diagnosis Date   Allergy    Anemia    Anxiety    claustrophobic   Asthma    Back pain    Biceps tendonosis of right shoulder    Cataract    Mixed OU   COVID-19    covid PNA hospitalized 2020, 06/06/21   Diabetes mellitus 2011   did not start metforfin, losing weight   Dyspnea    Fatty liver    Food allergy    Frozen shoulder    left, had steroid injection 10/05/21   Headache disorder    History of concussion    HLD (hyperlipidemia)    Hypertension    Hypertensive retinopathy    OU   IBS (irritable bowel syndrome)    Infertility, female    Joint pain    Lactose intolerance    Leg edema    Migraines    Palpitation    Post-menopausal    Seborrheic dermatitis    Shoulder impingement syndrome, right    Uveitis    [place] [facility] est in 2020 seen specialist in [place] [facility] last seen 11 or 05/2020   Vitamin D deficiency    Past Surgical History:  Procedure Laterality Date   ABDOMINAL HYSTERECTOMY  2006   heavy menses, endometriosis, l oophrectomy   BACK SURGERY     BREAST BIOPSY Right 2018   benign   BREAST EXCISIONAL BIOPSY Right 2003   benign   BREAST EXCISIONAL BIOPSY Right 1999   benign   BREAST SURGERY     right breast x 2 , benign   COLONOSCOPY  10/06/2020   HERNIA REPAIR  2003   left inguinal    LEFT OOPHORECTOMY     SHOULDER ARTHROSCOPY  WITH SUBACROMIAL DECOMPRESSION AND OPEN ROTATOR C Right 07/14/2016   Procedure: RIGHT SHOULDER ARTHROSCOPY WITH SUBACROMIAL DECOMPRESSION, DISTAL CLAVICLE RESECTION AND MINI OPEN ROTATOR CUFF REPAIR, OPEN BICEP TENDODESIS;  Surgeon: Garald Balding, MD;  Location: [place];  Service: Orthopedics;  Laterality: Right;   SHOULDER CLOSED REDUCTION Right 09/08/2016   Procedure: RIGHT CLOSED MANIPULATION SHOULDER;  Surgeon: Garald Balding, MD;  Location: [place];  Service: Orthopedics;  Laterality: Right;   TUBAL LIGATION     UPPER GASTROINTESTINAL ENDOSCOPY  10/06/2020   Patient Active Problem List   Diagnosis Date Noted   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 09/21/2021   Chronic left shoulder pain 09/21/2021   Sinus tachycardia 06/23/2021   Fatty infiltration of liver 07/26/2020   Diarrhea, functional 06/22/2020   Uveitis, intermediate, bilateral 09/07/2019   Generalized edema 06/13/2019   Abnormal electrocardiogram (ECG) (EKG) 06/07/2019   Hospital discharge follow-up 06/07/2019   Postviral fatigue syndrome  05/30/2019   Multinodular goiter 12/05/2017   Multiple thyroid nodules 12/05/2017   Microalbuminuria due to type 2 diabetes mellitus ([place]) 11/26/2017   Encounter for preventive health examination 11/26/2017   Menopause syndrome 06/10/2017   Adverse reaction to vaccine, sequela 03/25/2017   Palpitations 02/27/2017   PAC (premature atrial contraction) 02/27/2017   PVC's (premature ventricular contractions) 02/27/2017   PSVT (paroxysmal supraventricular tachycardia) ([place]) 11/26/2016   Nontraumatic incomplete tear of right rotator cuff 07/14/2016   AC (acromioclavicular) arthritis 07/14/2016   Impingement syndrome of right shoulder 07/14/2016   Fibrocystic breast changes, right 05/21/2016   Menopausal symptoms 10/30/2015   Insomnia 10/16/2015   Snoring 07/26/2015   Chronic migraine w/o aura w/o status migrainosus, not  intractable 02/24/2015   History of concussion 07/21/2014   Hyperlipidemia associated with type 2 diabetes mellitus ([place]) 06/13/2014   Vitamin D deficiency 05/07/2013   Chronic migraine 11/20/2012   S/P Total Abdominal Hysterectomy and Left Salpingo-oophorectomy 10/20/2011   Headache around the eyes    Uncontrolled type 2 diabetes mellitus 07/20/2011   Obesity (BMI 30-39.9) 07/20/2011   Essential hypertension 07/20/2011    REFERRING DIAG: traumatic incomplete tear of R rotator cuff/migraine/cervicalgia   THERAPY DIAG:  Cervicalgia  Chronic right shoulder pain  Stiffness of right shoulder, not elsewhere classified  Muscle weakness (generalized)  Rationale for Evaluation and Treatment Rehabilitation  PERTINENT HISTORY: Pt is a pleasant 51 y/o female presenting to PT due to chronic migraine headaches with cervical and bilateral shoulder pain. Pt has headaches every day. Pt reports onset of migraines at 48-64 years old where she had to seek emergency care due to severity of migraines. Pt reports COVID19 illness in 2020 and that migraines have been different since, including n/t that can occur on one side of her face (pt being followed by neurologist). Pt reports she has tried various medications, muscle relaxers to control migraines and related pain but that nothing has worked well yet. Other symptoms with her migraines include motion sickness, nausea, spinning sensation with her eyes closed, light and noise sensitivity, heightened sensitivity to touch on her face and scalp. 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 Pt with current R shoulder pain (reports has "possibly re-torn" R RTC), chronic R restrictions since 2020-2021 of no lifting greater than 10-15 lbs, no reaching behind, no reaching above her head.  Hx of R RTC repair 2018 per chart with dx of R shoulder adhesive capsulitis. Pt currently being treated for adhesive capsulitis of L shoulder..  Per chart other PMH significant for PMH: anxiety (claustrophobic), back pain, asthma, biceps tendonosis of R shoulder, DM, dyspnea, L frozen shouler (steroid injection 10/05/21), hx of concussion, headache disorder, HTN, HTN retinopathy, IBS, joint pain, leg edema, palpitation, R shoulder impingement syndrome, vitamin D deficiency, uveitis, paroxysmal supraventricular tachycardia, shoulder arthroscopy with subacromial decompression and mini open RTC repair, open bicep tenodesis  PRECAUTIONS: R shoulder; permanent restrictions. Do not lift more than 15lb. Latex allergy, must use latex-free therabands  Unless otherwise stated, all objective data taken from initial evaluation for shoulder    AROM 11/25/21 Right 11/25/21 Left 11/25/21  Shoulder Flexion 41 *  120  Shoulder Abduction 72* 125  ER 2 * WFL  IR 8* WFL  Elbow flexion  40* WFL  Elbow extension [facility] WFL   painful  SUBJECTIVE:  Reports thinks sling might be irritating shoulder, feels it doesn't fit quite right. Pt reports pain level in shoulder is 6-7/10. States, "I can't get it comfortable." Pt reports feels more in shoulder blade, describes as a burning sensation. Unsure if related to last appointment or headache. Pt reports she feels her muscles are very tight and like her neck is being pulled into extension. Reports headache is still present, never went away. Feels pain in scalp and behind her eyes.     PAIN:  Are you having pain? Yes: NPRS scale: 6-7/10 Pain location: headache Pain description: sharp  Aggravating factors:   Relieving factors:     TODAY'S TREATMENT:  01/12/2022   Elastogel donned to RUE while pt performs the following  Stretch into cervical flexion with chin tuck position 2x60 sec   Shoulder shrugs 15x  Cervical rotation stretch to L 1x60 sec  Shoulder circles CW  20x with LUE assist under RUE  Stability ball RUE (assist with LUE) FWD/BCKWD and LTL rollouts, circles through decreased range of motion x multiple reps of each within pain-free range  R shoulder adduction isometric with red foam ball at side, in seated 10x 5 sec holds. Rates easy requires LUE to assist movement.  Gentle cervical AROM: rotation, lateral flexion, flex/ext 10x for each   Ulnar nerve glide 15x. Reports feels a lot better today  YTB (latex-free) Shoulder ER/pull-aparts 15x. Stopped due to sharp sensation in R UT  RUE shoulder protraction (assisted by LUE) 5x, discontinued due to stabbing sensation in RUT  Seated scapular squeezes 8x with 3 sec holds  Updated HEP (see below)  PATIENT EDUCATION: Education details: exercise technique, body mechanics, HEP only perform if able to pain-free range Person educated: Patient Education method: Explanation, Demonstration, and Verbal cues Education comprehension: verbalized understanding, returned demonstration, verbal cues required, and needs further education   HOME EXERCISE PROGRAM:   01/12/22: [id number] FWD table slides and cervical rotation AROM 12/29/2021: no updates  Theraputty for grip strength    PT Short Term Goals       PT SHORT TERM GOAL #1   Title Patient will be independent in home exercise program to improve strength/mobility for better functional independence with ADLs.    Baseline 5/15: initiated; 7/19: reports been doing them, really likes the ulnar nerve glide   Time 6    Period Weeks    Status On-going   Target Date 11/30/21    PT SHORT TERM GOAL #2  Title Patient will report a worst pain of 5/10 on VAS in  R shoulder to improve tolerance with ADLs and reduced symptoms with activities  Baseline 6/22: 8/10; 7/19: 6/10   Time 6   Period Weeks   Status On-going   Target Date 11/30/21             PT Long Term Goals       PT LONG TERM GOAL #1   Title Patient will increase FOTO score to equal to  or greater than 60  to demonstrate statistically significant improvement in mobility and quality of life.    Baseline  7/19: 40   Time 12    Period Weeks    Status Goal Revised (now using Shoulder FOTO)   Target Date 01/10/22      PT LONG TERM GOAL #2   Title Patient will report a worst headache pain of 3/10 to improve tolerance with ADLs and reduced symptoms with activities.    Baseline 5/15: worst pain 8/10 (  with medication); 7/19: 6/10   Time 12    Period Weeks    Status On-going    Target Date 01/10/22      PT LONG TERM GOAL #3   Title Patient will increase UE gross strength to 4+/5 as to improve functional strength for increased ADL ability and ability to perform work duties.    Baseline 5/15: Grossly 4/5 BUEs and pain limited; 7/19: unable to test due to pain   Time 12    Period Weeks    Status On-going    Target Date 01/10/22      PT LONG TERM GOAL #4   Title The pt will show at least a 10 degree improvement with cervical L lateral flexion, extension, flexion, and bilateral rotatoin.    Baseline 5/16: Ext. 15 deg.   Flex. 23 deg.   Lateral flex: 20 deg. L  Rotation: 20-23 deg Bilat.; 7/19: L rotation 20 deg, Ext 17 deg, flex 25 deg, lat flex: 21 deg   Time 12    Period Weeks    Status On-going    Target Date 01/10/22    PT LONG TERM GOAL #5  Title Patient will report a worst pain of 3/10 on VAS in R shoulder to improve tolerance with ADLs and reduced symptoms with activities.   Baseline 6/22: 8/10; 7/19: 6/10  Time 12   Period Weeks   Status On-going   Target Date 01/10/22     PT LONG TERM GOAL #6  Title Patient will demonstrate adequate shoulder ROM and strength to be able to shave and dress independently with pain less than 3/10.  Baseline 6/22: flexion 41, abduction 72; unable to test strength; 7/19: flex 80 deg, abd 55 deg painful, unable to test strength  Time 12   Period Weeks   Status New   Target Date 01/10/22     PT LONG TERM GOAL #7  Title  Patient  will decrease Quick DASH score by > 8 points (71.5%)demonstrating reduced self-reported upper extremity disability.  Baseline 6/22: 79.5%; 7/19: 65.9%  Time 12   Period Weeks   Status Achieved   Target Date 01/10/22             Plan     Clinical Impression Statement Continued to modify level of assist and intensity of interventions throughout to reduce pain. Pt able to perform ulnar nerve glide pain-free today. Added RUE table slides (FWD/BCKWD) and cervical rotation AROM to HEP as pt was able to address these deficits in session without increased pain. Pt noted sharp pain felt radiating from R upper trap with protraction movements (discontinued). The pt will benefit from further skilled PT to improve pain, ROM and strength to increase QOL and ease with ADLs.  Addendum: Pt over reporting period has gradually shown modest improvements in ability to perform interventions pain-free and has been able to advance level of resistance used with some strengthening interventions. Patient's condition has the potential to improve in response to therapy. Maximum improvement is yet to be obtained. The anticipated improvement is attainable and reasonable in a generally predictable time.  Plan to reassess goals next visit.        Personal Factors and Comorbidities Comorbidity 3+;Sex;Time since onset of injury/illness/exacerbation    Comorbidities PMH: anxiety (claustrophobic), back pain, asthma, biceps tendonosis of R shoulder, DM, dyspnea, L frozen shouler (steroid injection 10/05/21), hx of concussion, headache disorder, HTN, HTN retinopathy, IBS, joint pain, leg edema, palpitation, R shoulder impingement syndrome, vitamin D difictiency,  uveitis, paroxysmal supraventricular tachycardia, shoulder arthroscopy with subacromial decompression and mini open RTC repair, open bicep tenodesis    Examination-Activity Limitations Locomotion Level;Caring for Others;Other;Reach Overhead;Carry   with increased pain pt reports  must rest, limits ability to perform job duties/ADLs   Examination-Participation Restrictions Occupation;Community Activity;Shop;Driving;Cleaning;Yard Work;Other;Laundry;Meal Prep   stiffness of cervical spine limits ability to look over shoulder with driving   Stability/Clinical Decision Making Evolving/Moderate complexity    Rehab Potential Good    PT Frequency 2x / week    PT Duration 12 weeks    PT Treatment/Interventions ADLs/Self Care Home Management;Biofeedback;Canalith Repostioning;Cryotherapy;Electrical Stimulation;Moist Heat;Traction;Ultrasound;DME Instruction;Gait training;Stair training;Functional mobility training;Therapeutic exercise;Balance training;Neuromuscular re-education;Patient/family education;Orthotic Fit/Training;Therapeutic activities;Manual techniques;Scar mobilization;Manual lymph drainage;Compression bandaging;Passive range of motion;Dry needling;Energy conservation;Splinting;Taping;Vestibular;Spinal Manipulations;Joint Manipulations;Iontophoresis '4mg'$ /ml Dexamethasone    PT Next Visit Plan E stim, ROM, isometrics, gentle theraband strengthening as able, continue plan   PT Home Exercise Plan Access Code: [email or web address];[email or web address] Ulnar nerve glide added to HEP to be performed daily. See above for updates   Consulted and Agree with Plan of Care Patient            Ricard Dillon PT, DPT  01/24/2022, 4:49 PM

## 2022-01-17 ENCOUNTER — Ambulatory Visit: Payer: 59

## 2022-01-19 ENCOUNTER — Ambulatory Visit: Payer: 59

## 2022-01-24 ENCOUNTER — Ambulatory Visit: Payer: 59

## 2022-01-24 NOTE — Addendum Note (Signed)
Addended by: Zollie Pee on: 01/24/2022 05:02 PM   Modules accepted: Orders

## 2022-01-26 ENCOUNTER — Ambulatory Visit: Payer: 59

## 2022-01-26 ENCOUNTER — Ambulatory Visit (INDEPENDENT_AMBULATORY_CARE_PROVIDER_SITE_OTHER): Payer: 59 | Admitting: Orthopaedic Surgery

## 2022-01-26 DIAGNOSIS — S46011A Strain of muscle(s) and tendon(s) of the rotator cuff of right shoulder, initial encounter: Secondary | ICD-10-CM

## 2022-01-26 NOTE — Progress Notes (Signed)
Chief Complaint: Bilateral shoulder pain     History of Present Illness:   01/26/2022: For follow-up of the left as well as right shoulder.  At this time the left shoulder feels essentially normal.  She is continuing to experience weakness and soreness in the right shoulder.  She recently had trigger point injections for the right shoulder.  This resulted in a flareup of her trapezius and neck pain.  Here today for MRI discussion of right shoulder  SHAELYN DECARLI is a 51 y.o. female right-hand-dominant presents with left shoulder pain that has been ongoing for 6 months now.  She has no known injury.  She does have a history of diabetes.  She states that she had previously had right shoulder rotator cuff repair and has subsequently used her left arm in order to compensate.  She is scheduled for trip to [place] the upcoming day to go see her daughter's cheer competition.  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  She is status post right shoulder rotator cuff repair done with Dr. Durward Fortes in 2018    Surgical History:   Right shoulder rotator cuff repair done in 2018  PMH/PSH/Family History/Social History/Meds/Allergies:    Past Medical History:  Diagnosis Date   Allergy    Anemia    Anxiety    claustrophobic   Asthma    Back pain    Biceps tendonosis of right shoulder    Cataract    Mixed OU   COVID-19    covid PNA hospitalized 2020, 06/06/21   Diabetes mellitus 2011   did not start metforfin, losing weight   Dyspnea    Fatty liver    Food allergy    Frozen shoulder    left, had steroid injection 10/05/21   Headache disorder    History of concussion    HLD (hyperlipidemia)    Hypertension    Hypertensive retinopathy    OU   IBS (irritable bowel syndrome)    Infertility, female    Joint pain    Lactose intolerance    Leg edema    Migraines    Palpitation    Post-menopausal    Seborrheic dermatitis    Shoulder impingement  syndrome, right    Uveitis    [facility] in 2020 seen specialist in [place] [facility] last seen 11 or 05/2020   Vitamin D deficiency    Past Surgical History:  Procedure Laterality Date   ABDOMINAL HYSTERECTOMY  2006   heavy menses, endometriosis, l oophrectomy   BACK SURGERY     BREAST BIOPSY Right 2018   benign   BREAST EXCISIONAL BIOPSY Right 2003   benign   BREAST EXCISIONAL BIOPSY Right 1999   benign   BREAST SURGERY     right breast x 2 , benign   COLONOSCOPY  10/06/2020   HERNIA REPAIR  2003   left inguinal    LEFT OOPHORECTOMY     SHOULDER ARTHROSCOPY WITH SUBACROMIAL DECOMPRESSION AND OPEN ROTATOR C Right 07/14/2016   Procedure: RIGHT SHOULDER ARTHROSCOPY WITH SUBACROMIAL DECOMPRESSION, DISTAL CLAVICLE RESECTION AND MINI OPEN ROTATOR CUFF REPAIR, OPEN BICEP TENDODESIS;  Surgeon: Garald Balding, MD;  Location: 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   Right-handed.   1-3 cups caffeine weekly.   Social  Determinants of Health   Financial Resource Strain: Not on file  Food Insecurity: Not on file  Transportation Needs: Not on file  Physical Activity: Not on file  Stress: Not on file  Social Connections: Not on file   Family History  Problem Relation Age of Onset   Diabetes Mother    Heart disease Mother 85   Hypertension Mother    Hyperlipidemia Mother    Heart failure Mother    Stroke Mother    Thyroid disease Mother    Depression Mother    Sleep apnea Mother    Obesity Mother    Colon polyps Mother    Cancer Father 46       Lung Cancer   Heart disease Father    Mental illness Sister        bipolar, substance abuse,  clean 2 yrs   Hypertension Sister    Cancer Paternal Grandmother 26       breast cancer   Breast cancer Paternal Grandmother    Diabetes Maternal Grandmother    Hypertension Maternal Grandmother    Diabetes Son    Colon cancer Neg Hx    Esophageal cancer Neg Hx    Rectal cancer Neg Hx    Stomach cancer Neg Hx    Allergies  Allergen Reactions   Bamlanivimab Anaphylaxis, Itching, Palpitations, Rash, Shortness Of Breath and Swelling   Botox [Onabotulinumtoxina] Shortness Of Breath    syncope   Clindamycin/Lincomycin Other (See Comments)   Kiwi Extract Shortness Of Breath   Maxalt [Rizatriptan Benzoate] Anaphylaxis    Chest pain   Nitrous Oxide Shortness Of Breath    syncope   Rizatriptan Benzoate Anaphylaxis    Chest pain   Triptans Shortness Of Breath   Vyepti [Eptinezumab-Jjmr] Shortness Of Breath, Palpitations and Other (See Comments)    Throat soreness, tachycardia   Aspirin Nausea And Vomiting    Mouth blisters   Contrast Media [Iodinated Contrast Media]    Erythromycin    Flagyl [Metronidazole]    Haemophilus Influenzae Vaccines    Latex     Sometimes causes rash   Mango Flavor    Vicodin [Hydrocodone-Acetaminophen]     hallucinations   Influenza Virus Vaccine Palpitations   Penicillins Nausea And Vomiting, Rash and Other (See  Comments)    Did it involve swelling of the face/tongue/throat, SOB, or low BP? No Did it involve sudden or  severe rash/hives, skin peeling, or any reaction on the inside of your mouth or nose? No Did you need to seek medical attention at a hospital or doctor's office? Yes When did it last happen?     patient was 51 years old  If all above answers are "NO", may proceed with cephalosporin use.   Tetracyclines & Related Nausea And Vomiting and Rash   Current Outpatient Medications  Medication Sig Dispense Refill   albuterol (VENTOLIN HFA) 108 (90 Base) MCG/ACT inhaler Inhale 2 puffs into the lungs every 6 (six) hours as needed for wheezing or shortness of breath. 18 g 0   ALPRAZolam (XANAX) 0.5 MG tablet Take 1 tablet (0.5 mg total) by mouth 3 (three) times daily as needed (Headache).     aspirin EC 81 MG tablet Take 81 mg by mouth daily.     Atogepant (QULIPTA) 60 MG TABS Take 60 mg by mouth daily. 30 tablet 3   atorvastatin (LIPITOR) 20 MG tablet Take 1 tablet (20 mg total) by mouth daily. 90 tablet 3   baclofen (LIORESAL) 10 MG tablet baclofen 10 mg tablet     Blood Glucose Monitoring Suppl (FREESTYLE LITE) DEVI 1 each by Does not apply route 2 (two) times daily. E11.9 1 each 0   Blood Pressure Monitoring (OMRON 3 SERIES BP MONITOR) DEVI Use as directed 1 each 0   butalbital-acetaminophen-caffeine (FIORICET) 50-325-40 MG tablet Take 1 tablet by mouth every 6 (six) hours as needed for headache. Do not refill in less than 30 day 30 tablet 1   Continuous Blood Gluc Receiver (DEXCOM G6 RECEIVER) DEVI Use as directed. 1 each 1   Continuous Blood Gluc Sensor (DEXCOM G7 SENSOR) MISC 3 each by Does not apply route every 30 (thirty) days. Apply 1 sensor every 10 days 9 each 4   Continuous Blood Gluc Transmit (DEXCOM G6 TRANSMITTER) MISC Use as directed every 90 days. 1 each 1   diazepam (VALIUM) 2 MG tablet Take 1 tablet (2 mg total) by mouth every 6 (six) hours as needed (Headache). 30 tablet 5    dicyclomine (BENTYL) 20 MG tablet Take 1 tablet (20 mg total) by mouth every 6 (six) hours. 90 tablet 1   EPINEPHrine 0.3 mg/0.3 mL IJ SOAJ injection Inject 0.3 mg into the muscle as needed for anaphylaxis. 1 each 0   ergocalciferol (DRISDOL) 1.25 MG (50000 UT) capsule Take 1 capsule (50,000 Units total) by mouth once a week. 4 capsule 2   glucose blood (FREESTYLE LITE) test strip 1 each by Other route 2 (two) times daily. E11.9 200 each 0   insulin degludec (TRESIBA FLEXTOUCH) 200 UNIT/ML FlexTouch Pen Inject 20 Units into the skin daily. 9 mL 3   insulin lispro (HUMALOG) 100 UNIT/ML KwikPen Inject 22-24 Units into the skin 2 (two) times daily with a meal. 45 mL 3   Insulin Pen Needle (UNIFINE PENTIPS) 32G X 4 MM MISC Use 1-2x a day 200 each 4   Ipratropium-Albuterol (COMBIVENT) 20-100 MCG/ACT AERS respimat Inhale 1 puff into the lungs every 6 (six) hours. 4 g 11   Lancets (FREESTYLE) lancets 1 each by Other route 2 (two) times daily. E11.9 200 each 0   losartan-hydrochlorothiazide (HYZAAR) 50-12.5 MG tablet Take 1 tablet by mouth daily. 90 tablet 1   meloxicam (MOBIC) 15 MG tablet Take 1 tablet (15 mg total) by mouth daily. 30 tablet 5   metFORMIN (GLUCOPHAGE-XR) 500 MG 24 hr tablet Take 2 tablets (1,000 mg total) by mouth daily  with supper. 180 tablet 3   metoprolol succinate (TOPROL-XL) 25 MG 24 hr tablet Take 0.5 tablets (12.5 mg total) by mouth daily. 45 tablet 3   ondansetron (ZOFRAN ODT) 4 MG disintegrating tablet Take 1 tablet (4 mg total) by mouth every 8 (eight) hours as needed for nausea or vomiting. 20 tablet 0   ondansetron (ZOFRAN) 8 MG tablet Take 1 tablet (8 mg total) by mouth every 8 (eight) hours as needed for nausea or vomiting. 20 tablet 0   oxyCODONE-acetaminophen (PERCOCET) 7.5-325 MG tablet Take 1 tablet by mouth every 4 (four) hours as needed for severe pain. 15 tablet 0   pantoprazole (PROTONIX) 40 MG tablet Take 1 tablet (40 mg total) by mouth daily. Take only with water  and take 30 minutes before eating. 90 tablet 3   Semaglutide, 2 MG/DOSE, (OZEMPIC, 2 MG/DOSE,) 8 MG/3ML SOPN Inject 2 mg into the skin once a week. 9 mL 3   traZODone (DESYREL) 50 MG tablet Take 0.5-1 tablets (25-50 mg total) by mouth at bedtime as needed for sleep. 90 tablet 0   No current facility-administered medications for this visit.   No results found.  Review of Systems:   A ROS was performed including pertinent positives and negatives as documented in the HPI.  Physical Exam :   Constitutional: NAD and appears stated age Neurological: Alert and oriented Psych: Appropriate affect and cooperative There were no vitals taken for this visit.   Comprehensive Musculoskeletal Exam:    Musculoskeletal Exam    Inspection Right Left  Skin No atrophy or winging No atrophy or winging  Palpation    Tenderness Lateral deltoid None  Range of Motion    Flexion (passive) 120 170  Flexion (active) 120 160  Abduction 100 160  ER at the side 50 70  Can reach behind back to T12 T12  Strength     4/5 5/5  Special Tests    Pseudoparalytic No No  Neurologic    Fires PIN, radial, median, ulnar, musculocutaneous, axillary, suprascapular, long thoracic, and spinal accessory innervated muscles. No abnormal sensibility  Vascular/Lymphatic    Radial Pulse 2+ 2+  Cervical Exam    Patient has symmetric cervical range of motion with negative Spurling's test.  Special Test: Positive Neer impingement on the right     Imaging:   Xray (views left shoulder): Normal  MRI right shoulder: Intact rotator cuff repair although this is quite thin.  There is significant fluid around the biceps tendon.  There is thickening of the inferior capsule as well as obliteration of the space.  I personally reviewed and interpreted the radiographs.   Assessment:   51 y.o. female right-hand-dominant presents with left shoulder pain consistent with adhesive capsulitis that is now resolved.  In terms of her right  shoulder as stated that there is evidence of possible retearing with an overall tendon repair.  I believe that this is resulting in her lack of limited overhead motion.  This is somewhat difficult to tease apart from possible adhesive capsulitis of the right shoulder.  Unfortunately she did not tolerate her last steroid injection and this fluctuated her blood sugars quite unreasonably.  To this extent I do not want to perform an additional steroid injection.  She is open to a long-acting bupivacaine injection in the subacromial space so that I can better ascertain if this is resolved or eliminates her pain.  If this is the case I do believe that it would be more likely that  her pain weakness is emanating from rotator cuff repair.  She will continue physical therapy in the meantime  Plan :    -Right shoulder injection performed after verbal consent obtained      I personally saw and evaluated the patient, and participated in the management and treatment plan.  Vanetta Mulders, MD Attending Physician, Orthopedic Surgery  This document was dictated using Dragon voice recognition software. A reasonable attempt at proof reading has been made to minimize errors.

## 2022-01-28 ENCOUNTER — Encounter: Payer: Self-pay | Admitting: Internal Medicine

## 2022-01-28 ENCOUNTER — Other Ambulatory Visit: Payer: Self-pay

## 2022-01-28 ENCOUNTER — Ambulatory Visit: Payer: 59 | Admitting: Internal Medicine

## 2022-01-28 VITALS — BP 128/82 | HR 83 | Ht 66.0 in | Wt 201.8 lb

## 2022-01-28 DIAGNOSIS — E1165 Type 2 diabetes mellitus with hyperglycemia: Secondary | ICD-10-CM | POA: Diagnosis not present

## 2022-01-28 DIAGNOSIS — E785 Hyperlipidemia, unspecified: Secondary | ICD-10-CM | POA: Diagnosis not present

## 2022-01-28 DIAGNOSIS — E1169 Type 2 diabetes mellitus with other specified complication: Secondary | ICD-10-CM | POA: Diagnosis not present

## 2022-01-28 DIAGNOSIS — E1142 Type 2 diabetes mellitus with diabetic polyneuropathy: Secondary | ICD-10-CM | POA: Diagnosis not present

## 2022-01-28 LAB — POCT GLYCOSYLATED HEMOGLOBIN (HGB A1C): Hemoglobin A1C: 6.5 % — AB (ref 4.0–5.6)

## 2022-01-28 MED ORDER — OZEMPIC (2 MG/DOSE) 8 MG/3ML ~~LOC~~ SOPN
2.0000 mg | PEN_INJECTOR | SUBCUTANEOUS | 3 refills | Status: DC
  Filled 2022-01-28: qty 3, 28d supply, fill #0
  Filled 2022-03-22 – 2022-04-14 (×3): qty 3, 28d supply, fill #1
  Filled 2022-05-19: qty 3, 28d supply, fill #2
  Filled 2022-06-16: qty 9, 84d supply, fill #3

## 2022-01-28 MED ORDER — METFORMIN HCL ER 500 MG PO TB24: 500.0000 mg | ORAL_TABLET | Freq: Every day | ORAL | 3 refills | Status: DC

## 2022-01-28 NOTE — Patient Instructions (Signed)
Please continue: - Ozempic 2 mg weekly - Metformin ER 500 mg 1x a day - Tresiba 20 units daily   Please return in 4-5 months.

## 2022-01-28 NOTE — Progress Notes (Signed)
Patient ID: [name], female   DOB: 09/23/1970, 51 y.o.   MRN: [id number]  HPI: [name] is a 51 y.o.-year-old female, returning for follow-up for DM2, dx in 2013, with history of GDM in 1990 and 2004, insulin-dependent since 2018, uncontrolled, with complications (mild CKD, PN).  I first saw the patient 3 months ago.  She was previously seen by Dr. Loanne [name].  Interim history: No increased urination, blurry vision, nausea, chest pain.  She lost weight since last visit -on Ozempic. Before last visit, she had a L shoulder steroid inj. >> sugars much higher.  No steroid injections since then.  Reviewed HbA1c: Lab Results  Component Value Date   HGBA1C 8.1 (A) 10/19/2021   HGBA1C 7.5 (A) 08/03/2021   HGBA1C 7.6 (A) 05/03/2021   HGBA1C 7.0 (A) 01/26/2021   HGBA1C 6.9 (A) 10/26/2020   HGBA1C 6.1 (A) 07/28/2020   HGBA1C 6.0 (A) 01/30/2020   HGBA1C 6.6 (A) 12/02/2019   HGBA1C 7.3 (A) 08/15/2019   HGBA1C 8.5 (H) 05/30/2019   At last visit she was on: - Ozempic 2 mg weekly - Humalog 22 -24 (30) units 2x a day after meals (patient has mostly 2 meals a day) She tried Victoza >> dysphagia. She tried Jardiance >> yeast infections. She was taking Metformin 11/2020 after he had IBS symptoms.  We changed to: - Ozempic 2 mg weekly - Metformin ER 500 mg 1x a day - started 10/2021 >> N/V/D with higher doses. [name] - started 10/2021: 14 >> 20 units daily   Pt checks her sugars more than 4 times a day with her Dexcom G6 CGM:  Previously:     Lowest sugar was 70s >> 74; she has hypoglycemia awareness at 70.  Highest sugar was 300s >> 201 (candy).  -+ Stage IIIa CKD, last BUN/creatinine:  Lab Results  Component Value Date   BUN 14 09/21/2021   BUN 13 03/15/2021   CREATININE 1.12 09/21/2021   CREATININE 1.09 (H) 03/15/2021  She is not on ACE inhibitor/ARB.  -+ HL; last set of lipids: Lab Results  Component Value Date   CHOL 141 09/21/2021   HDL 34.20 (L) 09/21/2021    LDLCALC 83 04/23/2018   LDLDIRECT 88.0 09/21/2021   TRIG 248.0 (H) 09/21/2021   CHOLHDL 4 09/21/2021  On Lipitor 20 mg daily.  - last eye exam was in 08/2021. No DR reportedly.   - + numbness and tingling in her feet. Last foot exam: 09/2021.  On ASA 81.  Patient also has a history of HTN, fatty liver, IBS.  ROS: + see HPI No increased urination, + blurry vision, no nausea, chest pain.  Past Medical History:  Diagnosis Date   Allergy    Anemia    Anxiety    claustrophobic   Asthma    Back pain    Biceps tendonosis of right shoulder    Cataract    Mixed OU   COVID-19    covid PNA hospitalized 2020, 06/06/21   Diabetes mellitus 2011   did not start metforfin, losing weight   Dyspnea    Fatty liver    Food allergy    Frozen shoulder    left, had steroid injection 10/05/21   Headache disorder    History of concussion    HLD (hyperlipidemia)    Hypertension    Hypertensive retinopathy    OU   IBS (irritable bowel syndrome)    Infertility, female    Joint pain  Lactose intolerance    Leg edema    Migraines    Palpitation    Post-menopausal    Seborrheic dermatitis    Shoulder impingement syndrome, right    Uveitis    [facility] in 2020 seen specialist in [facility] last seen 11 or 05/2020   Vitamin D deficiency    Past Surgical History:  Procedure Laterality Date   ABDOMINAL HYSTERECTOMY  2006   heavy menses, endometriosis, l oophrectomy   BACK SURGERY     BREAST BIOPSY Right 2018   benign   BREAST EXCISIONAL BIOPSY Right 2003   benign   BREAST EXCISIONAL BIOPSY Right 1999   benign   BREAST SURGERY     right breast x 2 , benign   COLONOSCOPY  10/06/2020   HERNIA REPAIR  2003   left inguinal    LEFT OOPHORECTOMY     SHOULDER ARTHROSCOPY WITH SUBACROMIAL DECOMPRESSION AND OPEN ROTATOR C Right 07/14/2016   Procedure: RIGHT SHOULDER ARTHROSCOPY WITH SUBACROMIAL DECOMPRESSION, DISTAL CLAVICLE RESECTION AND MINI OPEN ROTATOR CUFF REPAIR, OPEN  BICEP TENDODESIS;  Surgeon: Garald Balding, MD;  Location: [place];  Service: Orthopedics;  Laterality: Right;   SHOULDER CLOSED REDUCTION Right 09/08/2016   Procedure: RIGHT CLOSED MANIPULATION SHOULDER;  Surgeon: Garald Balding, MD;  Location: [place];  Service: Orthopedics;  Laterality: Right;   TUBAL LIGATION     UPPER GASTROINTESTINAL ENDOSCOPY  10/06/2020   Social History   Socioeconomic History   Marital status: Divorced    Spouse name: Not on file   Number of children: 2   Years of education: 14   Highest education level: Master's degree (e.g., MA, MS, MEng, MEd, MSW, MBA)  Occupational History   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   Right-handed.   1-3 cups caffeine weekly.   Social Determinants of Health   Financial Resource Strain: Not on file  Food Insecurity: Not on file  Transportation Needs: Not on file  Physical Activity: Not on file  Stress: Not on file  Social Connections: Not on file  Intimate Partner Violence: Not on file   Current Outpatient Medications on File Prior to Visit  Medication Sig Dispense Refill   albuterol (VENTOLIN HFA) 108 (90 Base) MCG/ACT inhaler Inhale 2 puffs into the lungs every 6 (six) hours as needed for wheezing or shortness of breath. 18 g 0   ALPRAZolam (XANAX) 0.5 MG tablet Take 1 tablet (0.5 mg total) by mouth 3 (three) times daily as needed (Headache).     aspirin EC 81 MG tablet Take 81 mg by mouth daily.     Atogepant (QULIPTA) 60 MG TABS Take 60 mg by mouth daily. 30 tablet 3   atorvastatin (LIPITOR) 20 MG tablet  Take 1 tablet (20 mg total) by mouth daily. 90 tablet 3   baclofen (LIORESAL) 10 MG tablet baclofen 10 mg tablet     Blood Glucose Monitoring Suppl (FREESTYLE LITE) DEVI 1 each by Does not apply route 2 (  two) times daily. E11.9 1 each 0   Blood Pressure Monitoring (OMRON 3 SERIES BP MONITOR) DEVI Use as directed 1 each 0   butalbital-acetaminophen-caffeine (FIORICET) 50-325-40 MG tablet Take 1 tablet by mouth every 6 (six) hours as needed for headache. Do not refill in less than 30 day 30 tablet 1   Continuous Blood Gluc Receiver (DEXCOM G6 RECEIVER) DEVI Use as directed. 1 each 1   Continuous Blood Gluc Sensor (DEXCOM G7 SENSOR) MISC 3 each by Does not apply route every 30 (thirty) days. Apply 1 sensor every 10 days 9 each 4   Continuous Blood Gluc Transmit (DEXCOM G6 TRANSMITTER) MISC Use as directed every 90 days. 1 each 1   diazepam (VALIUM) 2 MG tablet Take 1 tablet (2 mg total) by mouth every 6 (six) hours as needed (Headache). 30 tablet 5   dicyclomine (BENTYL) 20 MG tablet Take 1 tablet (20 mg total) by mouth every 6 (six) hours. 90 tablet 1   EPINEPHrine 0.3 mg/0.3 mL IJ SOAJ injection Inject 0.3 mg into the muscle as needed for anaphylaxis. 1 each 0   ergocalciferol (DRISDOL) 1.25 MG (50000 UT) capsule Take 1 capsule (50,000 Units total) by mouth once a week. 4 capsule 2   glucose blood (FREESTYLE LITE) test strip 1 each by Other route 2 (two) times daily. E11.9 200 each 0   insulin degludec (TRESIBA FLEXTOUCH) 200 UNIT/ML FlexTouch Pen Inject 20 Units into the skin daily. 9 mL 3   insulin lispro (HUMALOG) 100 UNIT/ML KwikPen Inject 22-24 Units into the skin 2 (two) times daily with a meal. 45 mL 3   Insulin Pen Needle (UNIFINE PENTIPS) 32G X 4 MM MISC Use 1-2x a day 200 each 4   Ipratropium-Albuterol (COMBIVENT) 20-100 MCG/ACT AERS respimat Inhale 1 puff into the lungs every 6 (six) hours. 4 g 11   Lancets (FREESTYLE) lancets 1 each by Other route 2 (two) times daily. E11.9 200 each 0    losartan-hydrochlorothiazide (HYZAAR) 50-12.5 MG tablet Take 1 tablet by mouth daily. 90 tablet 1   meloxicam (MOBIC) 15 MG tablet Take 1 tablet (15 mg total) by mouth daily. 30 tablet 5   metFORMIN (GLUCOPHAGE-XR) 500 MG 24 hr tablet Take 2 tablets (1,000 mg total) by mouth daily with supper. 180 tablet 3   metoprolol succinate (TOPROL-XL) 25 MG 24 hr tablet Take 0.5 tablets (12.5 mg total) by mouth daily. 45 tablet 3   ondansetron (ZOFRAN ODT) 4 MG disintegrating tablet Take 1 tablet (4 mg total) by mouth every 8 (eight) hours as needed for nausea or vomiting. 20 tablet 0   ondansetron (ZOFRAN) 8 MG tablet Take 1 tablet (8 mg total) by mouth every 8 (eight) hours as needed for nausea or vomiting. 20 tablet 0   oxyCODONE-acetaminophen (PERCOCET) 7.5-325 MG tablet Take 1 tablet by mouth every 4 (four) hours as needed for severe pain. 15 tablet 0   pantoprazole (PROTONIX) 40 MG tablet Take 1 tablet (40 mg total) by mouth daily. Take only with water and take 30 minutes before eating. 90 tablet 3   Semaglutide, 2 MG/DOSE, (OZEMPIC, 2 MG/DOSE,) 8 MG/3ML SOPN Inject 2 mg into the skin once a week. 9 mL 3   traZODone (DESYREL) 50 MG tablet Take 0.5-1 tablets (25-50 mg total) by mouth at bedtime as needed for sleep. 90 tablet 0   No current facility-administered medications on file prior to visit.   Allergies  Allergen Reactions   Bamlanivimab Anaphylaxis, Itching, Palpitations, Rash, Shortness Of  Breath and Swelling   Botox [Onabotulinumtoxina] Shortness Of Breath    syncope   Clindamycin/Lincomycin Other (See Comments)   Kiwi Extract Shortness Of Breath   Maxalt [Rizatriptan Benzoate] Anaphylaxis    Chest pain   Nitrous Oxide Shortness Of Breath    syncope   Rizatriptan Benzoate Anaphylaxis    Chest pain   Triptans Shortness Of Breath   Vyepti [Eptinezumab-Jjmr] Shortness Of Breath, Palpitations and Other (See Comments)    Throat soreness, tachycardia   Aspirin Nausea And Vomiting    Mouth  blisters   Contrast Media [Iodinated Contrast Media]    Erythromycin    Flagyl [Metronidazole]    Haemophilus Influenzae Vaccines    Latex     Sometimes causes rash   Mango Flavor    Vicodin [Hydrocodone-Acetaminophen]     hallucinations   Influenza Virus Vaccine Palpitations   Penicillins Nausea And Vomiting, Rash and Other (See Comments)    Did it involve swelling of the face/tongue/throat, SOB, or low BP? No Did it involve sudden or severe rash/hives, skin peeling, or any reaction on the inside of your mouth or nose? No Did you need to seek medical attention at a hospital or doctor's office? Yes When did it last happen?     patient was 52 years old  If all above answers are "NO", may proceed with cephalosporin use.   Tetracyclines & Related Nausea And Vomiting and Rash   Family History  Problem Relation Age of Onset   Diabetes Mother    Heart disease Mother 37   Hypertension Mother    Hyperlipidemia Mother    Heart failure Mother    Stroke Mother    Thyroid disease Mother    Depression Mother    Sleep apnea Mother    Obesity Mother    Colon polyps Mother    Cancer Father 50       Lung Cancer   Heart disease Father    Mental illness Sister        bipolar, substance abuse,  clean 2 yrs   Hypertension Sister    Cancer Paternal Grandmother 63       breast cancer   Breast cancer Paternal Grandmother    Diabetes Maternal Grandmother    Hypertension Maternal Grandmother    Diabetes Son    Colon cancer Neg Hx    Esophageal cancer Neg Hx    Rectal cancer Neg Hx    Stomach cancer Neg Hx    PE: BP 128/82 (BP Location: Left Arm, Patient Position: Sitting, Cuff Size: Normal)   Pulse 83   Ht '5\' 6"'$  (1.676 m)   Wt 201 lb 12.8 oz (91.5 kg)   SpO2 98%   BMI 32.57 kg/m  Wt Readings from Last 3 Encounters:  01/28/22 201 lb 12.8 oz (91.5 kg)  10/19/21 215 lb 6.4 oz (97.7 kg)  10/07/21 214 lb (97.1 kg)   Constitutional: overweight, in NAD Eyes: no exophthalmos ENT: moist  mucous membranes, no masses palpated in neck, no cervical lymphadenopathy Cardiovascular: RRR, No MRG Respiratory: CTA B Musculoskeletal: no deformities Skin: moist, warm, no rashes Neurological: no tremor with outstretched hands  ASSESSMENT: 1. DM2, insulin-dependent, uncontrolled, with complications - CKD - mild - PN  2. HL  PLAN:  1. Patient with longstanding, uncontrolled, type 2 diabetes, on oral antidiabetic regimen with metformin, also weekly GLP-1 receptor agonist and basal insulin, with much improved control since last visit.  At that time, her sugars were just starting to improve  after being very high around the time of a steroid injection.  HbA1c was 7.5%, above target.  I advised her to continue Ozempic but to also start metformin and Tresiba and stop Humalog (only keep it if needed for larger meals). CGM interpretation: -At today's visit, we reviewed her CGM downloads: It appears that 96% of values are in target range (goal >70%), while 4% are higher than 180 (goal <25%), and 0% are lower than 70 (goal <4%).  The calculated average blood sugar is 131.  The projected HbA1c for the next 3 months (GMI) is 6.4%. -Reviewing the CGM trends, sugars are dramatically improved, fluctuating almost 100% in the target range. -She tells me that she was not able to take 2 tablets of metformin daily due to GI symptoms (nausea, vomiting, diarrhea).  She tolerates the 1 tablet daily well.  We will continue this dose.  She is started [place] 14 units daily and she is currently at 20 units daily.  We can continue this dose.  At today's visit, I also refilled her Ozempic.  She tolerates this well and lost 40 pounds since last visit! - I suggested to:  Patient Instructions  Please continue: - Ozempic 2 mg weekly - Metformin ER 500 mg 1x a day - Tresiba 20 units daily   Please return in 4-5 months.  - we checked her HbA1c: 6.5% (improved) - advised to check sugars at different times of the day -  4x a day, rotating check times - advised for yearly eye exams >> she is UTD - return to clinic in 4-5 months   2. HL -Reviewed latest lipid panel from 09/2021: LDL above our target of less than 70, triglyceride high, HDL low: Lab Results  Component Value Date   CHOL 141 09/21/2021   HDL 34.20 (L) 09/21/2021   LDLCALC 83 04/23/2018   LDLDIRECT 88.0 09/21/2021   TRIG 248.0 (H) 09/21/2021   CHOLHDL 4 09/21/2021  -Continue Lipitor 20 mg daily without side effects  Philemon Kingdom, MD PhD [facility] Endocrin[facility]

## 2022-01-31 ENCOUNTER — Ambulatory Visit: Payer: 59

## 2022-02-02 ENCOUNTER — Ambulatory Visit: Payer: 59

## 2022-02-02 ENCOUNTER — Other Ambulatory Visit: Payer: Self-pay | Admitting: Internal Medicine

## 2022-02-02 ENCOUNTER — Other Ambulatory Visit: Payer: Self-pay

## 2022-02-02 DIAGNOSIS — M25511 Pain in right shoulder: Secondary | ICD-10-CM | POA: Diagnosis not present

## 2022-02-02 DIAGNOSIS — M6281 Muscle weakness (generalized): Secondary | ICD-10-CM | POA: Diagnosis not present

## 2022-02-02 DIAGNOSIS — G8929 Other chronic pain: Secondary | ICD-10-CM

## 2022-02-02 DIAGNOSIS — E559 Vitamin D deficiency, unspecified: Secondary | ICD-10-CM

## 2022-02-02 DIAGNOSIS — M25611 Stiffness of right shoulder, not elsewhere classified: Secondary | ICD-10-CM

## 2022-02-02 DIAGNOSIS — M542 Cervicalgia: Secondary | ICD-10-CM | POA: Diagnosis not present

## 2022-02-02 MED ORDER — ASPIRIN EC 81 MG PO TBEC
81.0000 mg | DELAYED_RELEASE_TABLET | Freq: Every day | ORAL | 1 refills | Status: DC
  Filled 2022-02-02: qty 30, 30d supply, fill #0

## 2022-02-02 MED ORDER — LOSARTAN POTASSIUM-HCTZ 50-12.5 MG PO TABS
1.0000 | ORAL_TABLET | Freq: Every day | ORAL | 1 refills | Status: DC
  Filled 2022-02-02: qty 90, 90d supply, fill #0
  Filled 2022-05-19: qty 90, 90d supply, fill #1

## 2022-02-02 MED ORDER — ONDANSETRON 4 MG PO TBDP
4.0000 mg | ORAL_TABLET | Freq: Three times a day (TID) | ORAL | 0 refills | Status: AC | PRN
  Filled 2022-02-02: qty 20, 7d supply, fill #0

## 2022-02-02 MED ORDER — BACLOFEN 10 MG PO TABS
10.0000 mg | ORAL_TABLET | Freq: Two times a day (BID) | ORAL | 3 refills | Status: DC | PRN
  Filled 2022-02-02: qty 60, 30d supply, fill #0

## 2022-02-02 MED ORDER — ERGOCALCIFEROL 1.25 MG (50000 UT) PO CAPS
50000.0000 [IU] | ORAL_CAPSULE | ORAL | 2 refills | Status: DC
  Filled 2022-02-02: qty 4, 28d supply, fill #0
  Filled 2022-03-22: qty 4, 28d supply, fill #1
  Filled 2022-05-19: qty 4, 28d supply, fill #2

## 2022-02-02 NOTE — Therapy (Signed)
OUTPATIENT PHYSICAL THERAPY TREATMENT NOTE   Patient Name: [name] MRN: [id number] DOB:1970/07/18, 51 y.o., female Today's Date: 02/02/2022  PCP: Crecencio Mc MD  REFERRING PROVIDER: Vanetta Mulders MD   PT End of Session - 02/02/22 1653     Visit Number 15    Number of Visits 25    Date for PT Re-Evaluation 04/04/22    Authorization Time Period 10/18/21-01/10/22    PT Start Time 6789    PT Stop Time 3810    PT Time Calculation (min) 43 min    Activity Tolerance Patient limited by pain;Patient tolerated treatment well    Behavior During Therapy [facility] for tasks assessed/performed                  Past Medical History:  Diagnosis Date   Allergy    Anemia    Anxiety    claustrophobic   Asthma    Back pain    Biceps tendonosis of right shoulder    Cataract    Mixed OU   COVID-19    covid PNA hospitalized 2020, 06/06/21   Diabetes mellitus 2011   did not start metforfin, losing weight   Dyspnea    Fatty liver    Food allergy    Frozen shoulder    left, had steroid injection 10/05/21   Headache disorder    History of concussion    HLD (hyperlipidemia)    Hypertension    Hypertensive retinopathy    OU   IBS (irritable bowel syndrome)    Infertility, female    Joint pain    Lactose intolerance    Leg edema    Migraines    Palpitation    Post-menopausal    Seborrheic dermatitis    Shoulder impingement syndrome, right    Uveitis    [place] eye est in 2020 seen specialist in [place] [facility] last seen 11 or 05/2020   Vitamin D deficiency    Past Surgical History:  Procedure Laterality Date   ABDOMINAL HYSTERECTOMY  2006   heavy menses, endometriosis, l oophrectomy   BACK SURGERY     BREAST BIOPSY Right 2018   benign   BREAST EXCISIONAL BIOPSY Right 2003   benign   BREAST EXCISIONAL BIOPSY Right 1999   benign   BREAST SURGERY     right breast x 2 , benign   COLONOSCOPY  10/06/2020   HERNIA REPAIR  2003   left inguinal    LEFT OOPHORECTOMY      SHOULDER ARTHROSCOPY WITH SUBACROMIAL DECOMPRESSION AND OPEN ROTATOR C Right 07/14/2016   Procedure: RIGHT SHOULDER ARTHROSCOPY WITH SUBACROMIAL DECOMPRESSION, DISTAL CLAVICLE RESECTION AND MINI OPEN ROTATOR CUFF REPAIR, OPEN BICEP TENDODESIS;  Surgeon: Garald Balding, MD;  Location: [place];  Service: Orthopedics;  Laterality: Right;   SHOULDER CLOSED REDUCTION Right 09/08/2016   Procedure: RIGHT CLOSED MANIPULATION SHOULDER;  Surgeon: Garald Balding, MD;  Location: [place];  Service: Orthopedics;  Laterality: Right;   TUBAL LIGATION     UPPER GASTROINTESTINAL ENDOSCOPY  10/06/2020   Patient Active Problem List   Diagnosis Date Noted   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 09/21/2021   Chronic left shoulder pain 09/21/2021   Sinus tachycardia 06/23/2021   Fatty infiltration of liver 07/26/2020   Diarrhea, functional 06/22/2020   Uveitis, intermediate, bilateral 09/07/2019   Generalized edema 06/13/2019   Abnormal electrocardiogram (ECG) (EKG) 06/07/2019   Hospital discharge follow-up 06/07/2019  Postviral fatigue syndrome 05/30/2019   Multinodular goiter 12/05/2017   Multiple thyroid nodules 12/05/2017   Microalbuminuria due to type 2 diabetes mellitus ([place]) 11/26/2017   Encounter for preventive health examination 11/26/2017   Menopause syndrome 06/10/2017   Adverse reaction to vaccine, sequela 03/25/2017   Palpitations 02/27/2017   PAC (premature atrial contraction) 02/27/2017   PVC's (premature ventricular contractions) 02/27/2017   PSVT (paroxysmal supraventricular tachycardia) ([place]) 11/26/2016   Nontraumatic incomplete tear of right rotator cuff 07/14/2016   AC (acromioclavicular) arthritis 07/14/2016   Impingement syndrome of right shoulder 07/14/2016   Fibrocystic breast changes, right 05/21/2016   Menopausal symptoms 10/30/2015   Insomnia 10/16/2015   Snoring 07/26/2015   Chronic migraine w/o aura w/o  status migrainosus, not intractable 02/24/2015   History of concussion 07/21/2014   Hyperlipidemia associated with type 2 diabetes mellitus ([place]) 06/13/2014   Vitamin D deficiency 05/07/2013   Chronic migraine 11/20/2012   S/P Total Abdominal Hysterectomy and Left Salpingo-oophorectomy 10/20/2011   Headache around the eyes    Uncontrolled type 2 diabetes mellitus 07/20/2011   Obesity (BMI 30-39.9) 07/20/2011   Essential hypertension 07/20/2011    REFERRING DIAG: traumatic incomplete tear of R rotator cuff/migraine/cervicalgia   THERAPY DIAG:  Chronic right shoulder pain  Cervicalgia  Stiffness of right shoulder, not elsewhere classified  Muscle weakness (generalized)  Rationale for Evaluation and Treatment Rehabilitation  PERTINENT HISTORY: Pt is a pleasant 51 y/o female presenting to PT due to chronic migraine headaches with cervical and bilateral shoulder pain. Pt has headaches every day. Pt reports onset of migraines at [age] years old where she had to seek emergency care due to severity of migraines. Pt reports COVID19 illness in 2020 and that migraines have been different since, including n/t that can occur on one side of her face (pt being followed by neurologist). Pt reports she has tried various medications, muscle relaxers to control migraines and related pain but that nothing has worked well yet. Other symptoms with her migraines include motion sickness, nausea, spinning sensation with her eyes closed, light and noise sensitivity, heightened sensitivity to touch on her face and scalp. 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 Pt with current R shoulder pain (reports has "possibly re-torn" R RTC), chronic R restrictions since 2020-2021 of no lifting greater than 10-15 lbs, no reaching behind, no  reaching above her head. Hx of R RTC repair 2018 per chart with dx of R shoulder adhesive capsulitis. Pt currently being treated for adhesive capsulitis of L shoulder..  Per chart other PMH significant for PMH: anxiety (claustrophobic), back pain, asthma, biceps tendonosis of R shoulder, DM, dyspnea, L frozen shouler (steroid injection 10/05/21), hx of concussion, headache disorder, HTN, HTN retinopathy, IBS, joint pain, leg edema, palpitation, R shoulder impingement syndrome, vitamin D deficiency, uveitis, paroxysmal supraventricular tachycardia, shoulder arthroscopy with subacromial decompression and mini open RTC repair, open bicep tenodesis  PRECAUTIONS: R shoulder; permanent restrictions. Do not lift more than 15lb. Latex allergy, must use latex-free therabands  Unless otherwise stated, all objective data taken from initial evaluation for shoulder    AROM 11/25/21 Right 11/25/21 Left 11/25/21  Shoulder Flexion 41 *  120  Shoulder Abduction 72* 125  ER 2 * WFL  IR 8* WFL  Elbow flexion  40* WFL  Elbow extension [facility]  painful   SUBJECTIVE:  Pt hasn't been able to attend recent PT sessions because she has had to work at the [place] location recently.  Had a recent follow-up with her physician regarding her shoulder and will be seeing him again on the 20th of Sept to determine next steps, possible surgery. Would like to improve pt's ROM still and thinks possible re-tear of R RTC. Pt also had recent non-steroidal injection in R shoulder, reports improved her pain for 24-48 hours, but now ache has returned.    PAIN:  Are you having pain? Yes: NPRS scale: 3/10 Pain location: R shoulder, supraspinatus/UT region, arm feels heavy Pain description: ache  Aggravating factors:   Relieving factors:     TODAY'S TREATMENT:  GOAL REASSESSMENT on this date. Please see goal section for full details.   FOTO shoulder: 47 improved  FOTO neck: 40 decreased - reports lots of recent  headaches  Elastogel donned to RUE while pt performs the following, reports feels good to R shoulder RUE: Shoulder shrugs 2x10 with mirror cue  Shoulder circles CW/CC 10x for each   Ulnar nerve glide 10x  Median nerve glide 10x   Latex free YTB shoulder ER/pull-aparts 10x 2 sets  Latex free YTB seated row 2x10  Standing, RUE supported isometrics 10 reps of flexion, IR, ER, 16 reps of flexion. Reports improves symptoms.   Pt to trial addition to HEP. Instructed to let pain guide pt in how many reps to complete.   Access Code: [email or web address] URL: [email or web address] Date: 02/02/2022 Prepared by: Ricard Dillon  Exercises - Standing Shoulder External Rotation with Resistance  - 1 x daily - 4 x weekly - 1 sets - 10 reps - Seated Shoulder Row with Resistance Anchored at Feet  - 1 x daily - 4 x weekly - 2 sets - 10 reps    PATIENT EDUCATION: Education details: exercise technique, body mechanics, HEP  Education method: Explanation, Demonstration, Verbal cues, and Handouts Education comprehension: verbalized understanding, returned demonstration, verbal cues required, and needs further education   HOME EXERCISE PROGRAM:   8/30: Access Code: [email or web address] URL: [email or web address] Date: 02/02/2022 Prepared by: Ricard Dillon  Exercises - Standing Shoulder External Rotation with Resistance  - 1 x daily - 4 x weekly - 1 sets - 10 reps - Seated Shoulder Row with Resistance Anchored at Feet  - 1 x daily - 4 x weekly - 2 sets - 10 reps 01/12/22: [email or web address] FWD table slides and cervical rotation AROM 12/29/2021: no updates  Theraputty for grip strength    PT Short Term Goals       PT SHORT TERM GOAL #1   Title Patient will be independent in home exercise program to improve strength/mobility for better functional independence with ADLs.    Baseline 5/15: initiated; 7/19: reports been doing them, really likes the ulnar nerve glide; 8/30 HEP advanced   Time 4    Period Weeks    Status On-going   Target Date 03/02/22    PT SHORT TERM GOAL #2  Title Patient will report a worst pain of 5/10 on VAS in  R shoulder to improve tolerance with ADLs and reduced symptoms with activities  Baseline 6/22: 8/10; 7/19: 6/10; 8/30: 6/10  Time 4   Period Weeks   Status On-going   Target Date 03/02/22             PT Long Term Goals Dates corrected to reflect recert      PT LONG TERM GOAL #1  Title Patient will increase FOTO score to equal to or greater than 60  to demonstrate statistically significant improvement in mobility and quality of life.    Baseline  7/19: 40; 8/30 47   Time 12    Period Weeks    Status Goal Revised (now using Shoulder FOTO)   Target Date 04/04/22      PT LONG TERM GOAL #2   Title Patient will report a worst headache pain of 3/10 to improve tolerance with ADLs and reduced symptoms with activities.    Baseline 5/15: worst pain 8/10 (with medication); 7/19: 6/10; 8/30: 4-5/10   Time 12    Period Weeks    Status On-going    Target Date 04/04/22       PT LONG TERM GOAL #3   Title Patient will increase UE gross strength to 4+/5 as to improve functional strength for increased ADL ability and ability to perform work duties.    Baseline 5/15: Grossly 4/5 BUEs and pain limited; 7/19: unable to test due to pain; 8/30: significantly pain limited RUE 3 or 3-/5 and unable to complete full formal assessment due to pain,  LUE 5/5    Time 12    Period Weeks    Status On-going    Target Date 04/04/22       PT LONG TERM GOAL #4   Title The pt will show at least a 10 degree improvement with cervical L lateral flexion, extension, flexion, and bilateral rotatoin.    Baseline 5/16: Ext. 15 deg.   Flex. 23 deg.   Lateral flex: 20 deg. L  Rotation: 20-23 deg Bilat.; 7/19: L rotation 20 deg, Ext 17 deg, flex 25 deg, lat flex: 21 deg; 8/30: deferred   Time 12    Period Weeks    Status On-going    Target Date 04/04/22     PT LONG TERM GOAL #5   Title Patient will report a worst pain of 3/10 on VAS in R shoulder to improve tolerance with ADLs and reduced symptoms with activities.   Baseline 6/22: 8/10; 7/19: 6/10; 8/30: 6/10  Time 12   Period Weeks   Status On-going   Target Date 04/04/22      PT LONG TERM GOAL #6  Title Patient will demonstrate adequate shoulder ROM and strength to be able to shave and dress independently with pain less than 3/10.  Baseline 6/22: flexion 41, abduction 72; unable to test strength; 7/19: flex 80 deg, abd 55 deg painful, unable to test strength; 8/30: 6/10  Time 12   Period Weeks   Status New   Target Date 04/04/22      PT LONG TERM GOAL #7  Title  Patient will decrease Quick DASH score by > 8 points (71.5%)demonstrating reduced self-reported upper extremity disability.  Baseline 6/22: 79.5%; 7/19: 65.9%  Time 12   Period Weeks   Status Achieved   Target Date 01/10/22             Plan     Clinical Impression Statement Goal assessment completed on this date. Pt shows progress with perceived QOL and functional mobility of R shoulder AEB improved FOTO score for shoulder. While pt shows progress, she did exhibit decrease in FOTO neck score. Pt also has had more headaches lately possible impacting this. Pt with other signs of improvement with R shoulder function with reports of decreased pain levels compared to previous assessment. However, strength and ROM of RUE still significantly limited due to pain.  The pt will benefit from further skilled PT to improve pain, ROM and strength to increase QOL and ease with ADLs.        Personal Factors and Comorbidities Comorbidity 3+;Sex;Time since onset of injury/illness/exacerbation    Comorbidities PMH: anxiety (claustrophobic), back pain, asthma, biceps tendonosis of R shoulder, DM, dyspnea, L frozen shouler (steroid injection 10/05/21), hx of concussion, headache disorder, HTN, HTN retinopathy, IBS, joint pain, leg edema, palpitation, R shoulder  impingement syndrome, vitamin D difictiency, uveitis, paroxysmal supraventricular tachycardia, shoulder arthroscopy with subacromial decompression and mini open RTC repair, open bicep tenodesis    Examination-Activity Limitations Locomotion Level;Caring for Others;Other;Reach Overhead;Carry   with increased pain pt reports must rest, limits ability to perform job duties/ADLs   Examination-Participation Restrictions Occupation;Community Activity;Shop;Driving;Cleaning;Yard Work;Other;Laundry;Meal Prep   stiffness of cervical spine limits ability to look over shoulder with driving   Stability/Clinical Decision Making Evolving/Moderate complexity    Rehab Potential Good    PT Frequency 2x / week    PT Duration 12 weeks    PT Treatment/Interventions ADLs/Self Care Home Management;Biofeedback;Canalith Repostioning;Cryotherapy;Electrical Stimulation;Moist Heat;Traction;Ultrasound;DME Instruction;Gait training;Stair training;Functional mobility training;Therapeutic exercise;Balance training;Neuromuscular re-education;Patient/family education;Orthotic Fit/Training;Therapeutic activities;Manual techniques;Scar mobilization;Manual lymph drainage;Compression bandaging;Passive range of motion;Dry needling;Energy conservation;Splinting;Taping;Vestibular;Spinal Manipulations;Joint Manipulations;Iontophoresis '4mg'$ /ml Dexamethasone    PT Next Visit Plan E stim, ROM, isometrics, gentle theraband strengthening as able, continue plan   PT Home Exercise Plan Access Code: [email or web address];5/32 Ulnar nerve glide added to HEP to be performed daily. See above for updates   Consulted and Agree with Plan of Care Patient            Ricard Dillon PT, DPT  02/02/2022, 5:04 PM

## 2022-02-03 ENCOUNTER — Other Ambulatory Visit: Payer: Self-pay

## 2022-02-04 ENCOUNTER — Other Ambulatory Visit: Payer: Self-pay

## 2022-02-10 ENCOUNTER — Other Ambulatory Visit: Payer: Self-pay

## 2022-02-14 ENCOUNTER — Ambulatory Visit: Payer: 59

## 2022-02-15 NOTE — Therapy (Signed)
OUTPATIENT PHYSICAL THERAPY TREATMENT NOTE   Patient Name: [name] MRN: [id number] DOB:1970-12-18, 51 y.o., female Today's Date: 02/16/2022  PCP: Crecencio Mc MD  REFERRING PROVIDER: Vanetta Mulders MD   PT End of Session - 02/16/22 1606     Visit Number 16    Number of Visits 25    Date for PT Re-Evaluation 04/04/22    Authorization Time Period 10/18/21-01/10/22    PT Start Time 9476    PT Stop Time 5465    PT Time Calculation (min) 38 min    Activity Tolerance Patient limited by pain;Patient tolerated treatment well    Behavior During Therapy [facility] for tasks assessed/performed                   Past Medical History:  Diagnosis Date   Allergy    Anemia    Anxiety    claustrophobic   Asthma    Back pain    Biceps tendonosis of right shoulder    Cataract    Mixed OU   COVID-19    covid PNA hospitalized 2020, 06/06/21   Diabetes mellitus 2011   did not start metforfin, losing weight   Dyspnea    Fatty liver    Food allergy    Frozen shoulder    left, had steroid injection 10/05/21   Headache disorder    History of concussion    HLD (hyperlipidemia)    Hypertension    Hypertensive retinopathy    OU   IBS (irritable bowel syndrome)    Infertility, female    Joint pain    Lactose intolerance    Leg edema    Migraines    Palpitation    Post-menopausal    Seborrheic dermatitis    Shoulder impingement syndrome, right    Uveitis    [facility] est in 2020 seen specialist in [place] [facility] last seen 11 or 05/2020   Vitamin D deficiency    Past Surgical History:  Procedure Laterality Date   ABDOMINAL HYSTERECTOMY  2006   heavy menses, endometriosis, l oophrectomy   BACK SURGERY     BREAST BIOPSY Right 2018   benign   BREAST EXCISIONAL BIOPSY Right 2003   benign   BREAST EXCISIONAL BIOPSY Right 1999   benign   BREAST SURGERY     right breast x 2 , benign   COLONOSCOPY  10/06/2020   HERNIA REPAIR  2003   left inguinal    LEFT  OOPHORECTOMY     SHOULDER ARTHROSCOPY WITH SUBACROMIAL DECOMPRESSION AND OPEN ROTATOR C Right 07/14/2016   Procedure: RIGHT SHOULDER ARTHROSCOPY WITH SUBACROMIAL DECOMPRESSION, DISTAL CLAVICLE RESECTION AND MINI OPEN ROTATOR CUFF REPAIR, OPEN BICEP TENDODESIS;  Surgeon: Garald Balding, MD;  Location: [place];  Service: Orthopedics;  Laterality: Right;   SHOULDER CLOSED REDUCTION Right 09/08/2016   Procedure: RIGHT CLOSED MANIPULATION SHOULDER;  Surgeon: Garald Balding, MD;  Location: [place];  Service: Orthopedics;  Laterality: Right;   TUBAL LIGATION     UPPER GASTROINTESTINAL ENDOSCOPY  10/06/2020   Patient Active Problem List   Diagnosis Date Noted   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 09/21/2021   Chronic left shoulder pain 09/21/2021   Sinus tachycardia 06/23/2021   Fatty infiltration of liver 07/26/2020   Diarrhea, functional 06/22/2020   Uveitis, intermediate, bilateral 09/07/2019   Generalized edema 06/13/2019   Abnormal electrocardiogram (ECG) (EKG) 06/07/2019   Hospital discharge follow-up 06/07/2019  Postviral fatigue syndrome 05/30/2019   Multinodular goiter 12/05/2017   Multiple thyroid nodules 12/05/2017   Microalbuminuria due to type 2 diabetes mellitus ([place]) 11/26/2017   Encounter for preventive health examination 11/26/2017   Menopause syndrome 06/10/2017   Adverse reaction to vaccine, sequela 03/25/2017   Palpitations 02/27/2017   PAC (premature atrial contraction) 02/27/2017   PVC's (premature ventricular contractions) 02/27/2017   PSVT (paroxysmal supraventricular tachycardia) ([place]) 11/26/2016   Nontraumatic incomplete tear of right rotator cuff 07/14/2016   AC (acromioclavicular) arthritis 07/14/2016   Impingement syndrome of right shoulder 07/14/2016   Fibrocystic breast changes, right 05/21/2016   Menopausal symptoms 10/30/2015   Insomnia 10/16/2015   Snoring 07/26/2015   Chronic migraine  w/o aura w/o status migrainosus, not intractable 02/24/2015   History of concussion 07/21/2014   Hyperlipidemia associated with type 2 diabetes mellitus ([place]) 06/13/2014   Vitamin D deficiency 05/07/2013   Chronic migraine 11/20/2012   S/P Total Abdominal Hysterectomy and Left Salpingo-oophorectomy 10/20/2011   Headache around the eyes    Uncontrolled type 2 diabetes mellitus 07/20/2011   Obesity (BMI 30-39.9) 07/20/2011   Essential hypertension 07/20/2011    REFERRING DIAG: traumatic incomplete tear of R rotator cuff/migraine/cervicalgia   THERAPY DIAG:  Cervicalgia  Chronic right shoulder pain  Stiffness of right shoulder, not elsewhere classified  Muscle weakness (generalized)  Rationale for Evaluation and Treatment Rehabilitation  PERTINENT HISTORY: Pt is a pleasant 51 y/o female presenting to PT due to chronic migraine headaches with cervical and bilateral shoulder pain. Pt has headaches every day. Pt reports onset of migraines at 40-34 years old where she had to seek emergency care due to severity of migraines. Pt reports COVID19 illness in 2020 and that migraines have been different since, including n/t that can occur on one side of her face (pt being followed by neurologist). Pt reports she has tried various medications, muscle relaxers to control migraines and related pain but that nothing has worked well yet. Other symptoms with her migraines include motion sickness, nausea, spinning sensation with her eyes closed, light and noise sensitivity, heightened sensitivity to touch on her face and scalp. 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 Pt with current R shoulder pain (reports has "possibly re-torn" R RTC), chronic R restrictions since 2020-2021 of no lifting greater than 10-15 lbs, no reaching  behind, no reaching above her head. Hx of R RTC repair 2018 per chart with dx of R shoulder adhesive capsulitis. Pt currently being treated for adhesive capsulitis of L shoulder..  Per chart other PMH significant for PMH: anxiety (claustrophobic), back pain, asthma, biceps tendonosis of R shoulder, DM, dyspnea, L frozen shouler (steroid injection 10/05/21), hx of concussion, headache disorder, HTN, HTN retinopathy, IBS, joint pain, leg edema, palpitation, R shoulder impingement syndrome, vitamin D deficiency, uveitis, paroxysmal supraventricular tachycardia, shoulder arthroscopy with subacromial decompression and mini open RTC repair, open bicep tenodesis  PRECAUTIONS: R shoulder; permanent restrictions. Do not lift more than 15lb. Latex allergy, must use latex-free therabands  Unless otherwise stated, all objective data taken from initial evaluation for shoulder    AROM 11/25/21 Right 11/25/21 Left 11/25/21  Shoulder Flexion 41 *  120  Shoulder Abduction 72* 125  ER 2 * WFL  IR 8* WFL  Elbow flexion  40* WFL  Elbow extension [facility]  painful   SUBJECTIVE:  Patient came from work today. Will see physician next week.    PAIN:  Are you having pain? Yes: NPRS scale: 3/10 Pain location: R shoulder, supraspinatus/UT region, arm feels heavy Pain description: ache  Aggravating factors:   Relieving factors:     TODAY'S TREATMENT:    Elastogel donned to RUE while pt performs the following, reports feels good to R shoulder RUE: Isometric supine position 10x cue for 30-40% muscle recruitment; flexion, extension, abduction, adduction Bicep trigger point with extension x3 minutes Wrist flexor/extensor trigger point with supination/pronation x 6 minutes R shoulder AAROM 8x; with rest after 4 flexion Bicep flexion/extension AROM 10x Bicep ER/IR AAROM 10x  Shoulder circles CW/CC 10x for each use of mirror for cue   Latex free YTB shoulder ER/pull-aparts 10x      Access Code:  [email or web address] URL: [email or web address] Date: 02/02/2022 Prepared by: Ricard Dillon  Exercises - Standing Shoulder External Rotation with Resistance  - 1 x daily - 4 x weekly - 1 sets - 10 reps - Seated Shoulder Row with Resistance Anchored at Feet  - 1 x daily - 4 x weekly - 2 sets - 10 reps    PATIENT EDUCATION: Education details: exercise technique, body mechanics, HEP  Education method: Explanation, Demonstration, Verbal cues, and Handouts Education comprehension: verbalized understanding, returned demonstration, verbal cues required, and needs further education   HOME EXERCISE PROGRAM:   8/30: Access Code: [email or web address] URL: [email or web address] Date: 02/02/2022 Prepared by: Ricard Dillon  Exercises - Standing Shoulder External Rotation with Resistance  - 1 x daily - 4 x weekly - 1 sets - 10 reps - Seated Shoulder Row with Resistance Anchored at Feet  - 1 x daily - 4 x weekly - 2 sets - 10 reps 01/12/22: [id number] FWD table slides and cervical rotation AROM 12/29/2021: no updates  Theraputty for grip strength    PT Short Term Goals       PT SHORT TERM GOAL #1   Title Patient will be independent in home exercise program to improve strength/mobility for better functional independence with ADLs.    Baseline 5/15: initiated; 7/19: reports been doing them, really likes the ulnar nerve glide; 8/30 HEP advanced   Time 4   Period Weeks    Status On-going   Target Date 03/02/22    PT SHORT TERM GOAL #2  Title Patient will report a worst pain of 5/10 on VAS in  R shoulder to improve tolerance with ADLs and reduced symptoms with activities  Baseline 6/22: 8/10; 7/19: 6/10; 8/30: 6/10  Time 4   Period Weeks   Status On-going   Target Date 03/02/22             PT Long Term Goals Dates corrected to reflect recert      PT LONG TERM GOAL #1   Title Patient will increase FOTO score to equal to or greater than 60  to demonstrate statistically significant  improvement in mobility and quality of life.    Baseline  7/19: 40; 8/30 47   Time 12    Period Weeks    Status Goal Revised (now using Shoulder FOTO)   Target Date 04/04/22      PT LONG TERM GOAL #2   Title Patient will report a worst headache pain of 3/10 to improve tolerance with ADLs and reduced symptoms with activities.    Baseline 5/15: worst pain 8/10 (with medication); 7/19: 6/10; 8/30: 4-5/10   Time 12  Period Weeks    Status On-going    Target Date 04/04/22       PT LONG TERM GOAL #3   Title Patient will increase UE gross strength to 4+/5 as to improve functional strength for increased ADL ability and ability to perform work duties.    Baseline 5/15: Grossly 4/5 BUEs and pain limited; 7/19: unable to test due to pain; 8/30: significantly pain limited RUE 3 or 3-/5 and unable to complete full formal assessment due to pain,  LUE 5/5    Time 12    Period Weeks    Status On-going    Target Date 04/04/22       PT LONG TERM GOAL #4   Title The pt will show at least a 10 degree improvement with cervical L lateral flexion, extension, flexion, and bilateral rotatoin.    Baseline 5/16: Ext. 15 deg.   Flex. 23 deg.   Lateral flex: 20 deg. L  Rotation: 20-23 deg Bilat.; 7/19: L rotation 20 deg, Ext 17 deg, flex 25 deg, lat flex: 21 deg; 8/30: deferred   Time 12    Period Weeks    Status On-going    Target Date 04/04/22     PT LONG TERM GOAL #5  Title Patient will report a worst pain of 3/10 on VAS in R shoulder to improve tolerance with ADLs and reduced symptoms with activities.   Baseline 6/22: 8/10; 7/19: 6/10; 8/30: 6/10  Time 12   Period Weeks   Status On-going   Target Date 04/04/22      PT LONG TERM GOAL #6  Title Patient will demonstrate adequate shoulder ROM and strength to be able to shave and dress independently with pain less than 3/10.  Baseline 6/22: flexion 41, abduction 72; unable to test strength; 7/19: flex 80 deg, abd 55 deg painful, unable to test strength;  8/30: 6/10  Time 12   Period Weeks   Status New   Target Date 04/04/22      PT LONG TERM GOAL #7  Title  Patient will decrease Quick DASH score by > 8 points (71.5%)demonstrating reduced self-reported upper extremity disability.  Baseline 6/22: 79.5%; 7/19: 65.9%  Time 12   Period Weeks   Status Achieved   Target Date 01/10/22             Plan     Clinical Impression Statement Patient educated on use of putty between finger tips in combination with forward flexion AAROM and YTB ER to assist with strengthening for carryover to the range. Patient meets with physician next week to discuss potential surgical options. Patient tolerates gentle strengthening with intermittent pain increase. The pt will benefit from further skilled PT to improve pain, ROM and strength to increase QOL and ease with ADLs.        Personal Factors and Comorbidities Comorbidity 3+;Sex;Time since onset of injury/illness/exacerbation    Comorbidities PMH: anxiety (claustrophobic), back pain, asthma, biceps tendonosis of R shoulder, DM, dyspnea, L frozen shouler (steroid injection 10/05/21), hx of concussion, headache disorder, HTN, HTN retinopathy, IBS, joint pain, leg edema, palpitation, R shoulder impingement syndrome, vitamin D difictiency, uveitis, paroxysmal supraventricular tachycardia, shoulder arthroscopy with subacromial decompression and mini open RTC repair, open bicep tenodesis    Examination-Activity Limitations Locomotion Level;Caring for Others;Other;Reach Overhead;Carry   with increased pain pt reports must rest, limits ability to perform job duties/ADLs   Examination-Participation Restrictions Occupation;Community Activity;Shop;Driving;Cleaning;Yard Work;Other;Laundry;Meal Prep   stiffness of cervical spine limits ability to look over  shoulder with driving   Stability/Clinical Decision Making Evolving/Moderate complexity    Rehab Potential Good    PT Frequency 2x / week    PT Duration 12 weeks    PT  Treatment/Interventions ADLs/Self Care Home Management;Biofeedback;Canalith Repostioning;Cryotherapy;Electrical Stimulation;Moist Heat;Traction;Ultrasound;DME Instruction;Gait training;Stair training;Functional mobility training;Therapeutic exercise;Balance training;Neuromuscular re-education;Patient/family education;Orthotic Fit/Training;Therapeutic activities;Manual techniques;Scar mobilization;Manual lymph drainage;Compression bandaging;Passive range of motion;Dry needling;Energy conservation;Splinting;Taping;Vestibular;Spinal Manipulations;Joint Manipulations;Iontophoresis '4mg'$ /ml Dexamethasone    PT Next Visit Plan E stim, ROM, isometrics, gentle theraband strengthening as able, continue plan   PT Home Exercise Plan Access Code: [email or web address];5/32 Ulnar nerve glide added to HEP to be performed daily. See above for updates   Consulted and Agree with Plan of Care Patient            Janna Arch PT   02/16/2022, 4:47 PM

## 2022-02-16 ENCOUNTER — Ambulatory Visit: Payer: 59 | Attending: Psychiatry

## 2022-02-16 DIAGNOSIS — M25511 Pain in right shoulder: Secondary | ICD-10-CM | POA: Insufficient documentation

## 2022-02-16 DIAGNOSIS — M6281 Muscle weakness (generalized): Secondary | ICD-10-CM | POA: Insufficient documentation

## 2022-02-16 DIAGNOSIS — M542 Cervicalgia: Secondary | ICD-10-CM | POA: Diagnosis not present

## 2022-02-16 DIAGNOSIS — M25611 Stiffness of right shoulder, not elsewhere classified: Secondary | ICD-10-CM | POA: Diagnosis not present

## 2022-02-16 DIAGNOSIS — G8929 Other chronic pain: Secondary | ICD-10-CM | POA: Insufficient documentation

## 2022-02-21 ENCOUNTER — Ambulatory Visit: Payer: 59

## 2022-02-21 NOTE — Progress Notes (Unsigned)
   CC:  headaches  Follow-up Visit  Last visit: 10/05/21  Brief HPI: 51 year old female with a history of HTN, HLD, DM, asthma who follows in clinic for migraine with visual and sensory aura.  At her last visit she was started on Qulipta for headache prevention. Interval History: ***   Headache days per month: *** Headache free days per month: *** Headache severity: ***  Current Headache Regimen: Preventative: *** Abortive: ***   Prior Therapies                                  Rescue: Baclofen Fioricet Maxalt - anaphylaxis Nurtec  Preventive: Ajovy Emgality Vyepti - chest pain Botox - bradycardia, chest pain, shortness of breath Topamax - eye pressure Zonisamide Effexor - diarrhea Nortriptyline Verapamil Propranolol Nerve block  Physical Exam:   Vital Signs: There were no vitals taken for this visit. GENERAL:  well appearing, in no acute distress, alert  SKIN:  Color, texture, turgor normal. No rashes or lesions HEAD:  Normocephalic/atraumatic. RESP: normal respiratory effort MSK:  No gross joint deformities.   NEUROLOGICAL: Mental Status: Alert, oriented to person, place and time, Follows commands, and Speech fluent and appropriate. Cranial Nerves: PERRL, face symmetric, no dysarthria, hearing grossly intact Motor: moves all extremities equally Gait: normal-based.  IMPRESSION: ***  PLAN: ***   Follow-up: ***  I spent a total of *** minutes on the date of the service. Headache education was done. Discussed lifestyle modification including increased oral hydration, decreased caffeine, exercise and stress management. Discussed treatment options including preventive and acute medications, natural supplements, and infusion therapy. Discussed medication overuse headache and to limit use of acute treatments to no more than 2 days/week or 10 days/month. Discussed medication side effects, adverse reactions and drug interactions. Written educational  materials and patient instructions outlining all of the above were given.  Genia Harold, MD

## 2022-02-22 ENCOUNTER — Other Ambulatory Visit: Payer: Self-pay

## 2022-02-22 ENCOUNTER — Ambulatory Visit: Payer: 59 | Admitting: Psychiatry

## 2022-02-22 VITALS — BP 104/70 | HR 84 | Ht 66.0 in | Wt 200.0 lb

## 2022-02-22 DIAGNOSIS — G43119 Migraine with aura, intractable, without status migrainosus: Secondary | ICD-10-CM

## 2022-02-22 MED ORDER — UBRELVY 100 MG PO TABS
100.0000 mg | ORAL_TABLET | ORAL | 6 refills | Status: DC | PRN
  Filled 2022-02-22: qty 16, 30d supply, fill #0
  Filled 2022-02-23: qty 10, 30d supply, fill #0
  Filled 2022-03-24: qty 16, 8d supply, fill #0
  Filled 2022-04-01: qty 10, 20d supply, fill #0

## 2022-02-23 ENCOUNTER — Telehealth: Payer: Self-pay

## 2022-02-23 ENCOUNTER — Other Ambulatory Visit: Payer: Self-pay

## 2022-02-23 ENCOUNTER — Ambulatory Visit (HOSPITAL_BASED_OUTPATIENT_CLINIC_OR_DEPARTMENT_OTHER): Payer: 59 | Admitting: Orthopaedic Surgery

## 2022-02-23 ENCOUNTER — Ambulatory Visit: Payer: 59

## 2022-02-23 NOTE — Telephone Encounter (Signed)
PA for [name] has been sent to [employer] via [employer]. Awaiting determination from [employer]. [id number]

## 2022-02-23 NOTE — Telephone Encounter (Addendum)
PA has been APPROVED. Maximum of 6 fills from 02/23/22-08/23/22. Pharmacy notified via fax.

## 2022-02-28 ENCOUNTER — Ambulatory Visit: Payer: 59

## 2022-03-01 ENCOUNTER — Telehealth: Payer: Self-pay | Admitting: Psychiatry

## 2022-03-01 NOTE — Telephone Encounter (Signed)
Mychart message to request payment for [facility] paperwork

## 2022-03-02 ENCOUNTER — Ambulatory Visit: Payer: 59

## 2022-03-02 ENCOUNTER — Ambulatory Visit (HOSPITAL_BASED_OUTPATIENT_CLINIC_OR_DEPARTMENT_OTHER): Payer: 59 | Admitting: Orthopaedic Surgery

## 2022-03-02 ENCOUNTER — Telehealth: Payer: 59 | Admitting: Physician Assistant

## 2022-03-02 DIAGNOSIS — R6889 Other general symptoms and signs: Secondary | ICD-10-CM

## 2022-03-02 DIAGNOSIS — R059 Cough, unspecified: Secondary | ICD-10-CM

## 2022-03-02 MED ORDER — PREDNISONE 20 MG PO TABS: 20.0000 mg | ORAL_TABLET | Freq: Every day | ORAL | 0 refills | Status: DC

## 2022-03-02 MED ORDER — BENZONATATE 100 MG PO CAPS: 100.0000 mg | ORAL_CAPSULE | Freq: Three times a day (TID) | ORAL | 0 refills | Status: DC | PRN

## 2022-03-02 MED ORDER — ALBUTEROL SULFATE HFA 108 (90 BASE) MCG/ACT IN AERS: 2.0000 | INHALATION_SPRAY | Freq: Four times a day (QID) | RESPIRATORY_TRACT | 0 refills | Status: DC | PRN

## 2022-03-02 MED ORDER — PROMETHAZINE-DM 6.25-15 MG/5ML PO SYRP: 5.0000 mL | ORAL_SOLUTION | Freq: Four times a day (QID) | ORAL | 0 refills | Status: DC | PRN

## 2022-03-02 MED ORDER — OSELTAMIVIR PHOSPHATE 75 MG PO CAPS: 75.0000 mg | ORAL_CAPSULE | Freq: Two times a day (BID) | ORAL | 0 refills | Status: DC

## 2022-03-02 NOTE — Progress Notes (Signed)
Virtual Visit Consent   [name], you are scheduled for a virtual visit with a [facility] provider today. Just as with appointments in the office, your consent must be obtained to participate. Your consent will be active for this visit and any virtual visit you may have with one of our providers in the next 365 days. If you have a MyChart account, a copy of this consent can be sent to you electronically.  As this is a virtual visit, video technology does not allow for your provider to perform a traditional examination. This may limit your provider's ability to fully assess your condition. If your provider identifies any concerns that need to be evaluated in person or the need to arrange testing (such as labs, EKG, etc.), we will make arrangements to do so. Although advances in technology are sophisticated, we cannot ensure that it will always work on either your end or our end. If the connection with a video visit is poor, the visit may have to be switched to a telephone visit. With either a video or telephone visit, we are not always able to ensure that we have a secure connection.  By engaging in this virtual visit, you consent to the provision of healthcare and authorize for your insurance to be billed (if applicable) for the services provided during this visit. Depending on your insurance coverage, you may receive a charge related to this service.  I need to obtain your verbal consent now. Are you willing to proceed with your visit today? TAMETHA BANNING has provided verbal consent on 03/02/2022 for a virtual visit (video or telephone). Mar Daring, PA-C  Date: 03/02/2022 8:23 AM  Virtual Visit via Video Note   I, Mar Daring, connected with  [name]  ([id number], 14-Feb-1971) on 03/02/22 at  8:15 AM EDT by a video-enabled telemedicine application and verified that I am speaking with the correct person using two identifiers.  Location: Patient: Virtual Visit  Location Patient: Home Provider: Virtual Visit Location Provider: Home Office   I discussed the limitations of evaluation and management by telemedicine and the availability of in person appointments. The patient expressed understanding and agreed to proceed.    History of Present Illness: [name] is a 51 y.o. who identifies as a female who was assigned female at birth, and is being seen today for flu-like symptoms.  HPI: URI  This is a new problem. The current episode started yesterday. The problem has been gradually worsening. The maximum temperature recorded prior to her arrival was 101 - 101.9 F (101.3). The fever has been present for Less than 1 day. Associated symptoms include chest pain (tightness), congestion, coughing, headaches, nausea, a sore throat (scratchy) and vomiting. Pertinent negatives include no ear pain, plugged ear sensation, rhinorrhea or sinus pain. Associated symptoms comments: Myalgias, shortness of breath with asthma, chills, intense fatigue, malaise. She has tried increased fluids and NSAIDs (ibuprofen) for the symptoms. The treatment provided no relief.    Antigen covid 19 test (at work) was negative then had PCR test that was negative.  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 09/21/2021   Chronic left shoulder pain 09/21/2021   Sinus tachycardia 06/23/2021   Fatty infiltration of liver 07/26/2020   Diarrhea, functional 06/22/2020   Uveitis, intermediate, bilateral 09/07/2019  Generalized edema 06/13/2019   Abnormal electrocardiogram (ECG) (EKG) 06/07/2019   Hospital discharge follow-up 06/07/2019   Postviral fatigue syndrome 05/30/2019   Multinodular goiter 12/05/2017   Multiple thyroid nodules 12/05/2017   Microalbuminuria due to type 2 diabetes mellitus ([place]) 11/26/2017   Encounter for  preventive health examination 11/26/2017   Menopause syndrome 06/10/2017   Adverse reaction to vaccine, sequela 03/25/2017   Palpitations 02/27/2017   PAC (premature atrial contraction) 02/27/2017   PVC's (premature ventricular contractions) 02/27/2017   PSVT (paroxysmal supraventricular tachycardia) ([place]) 11/26/2016   Nontraumatic incomplete tear of right rotator cuff 07/14/2016   AC (acromioclavicular) arthritis 07/14/2016   Impingement syndrome of right shoulder 07/14/2016   Fibrocystic breast changes, right 05/21/2016   Menopausal symptoms 10/30/2015   Insomnia 10/16/2015   Snoring 07/26/2015   Chronic migraine w/o aura w/o status migrainosus, not intractable 02/24/2015   History of concussion 07/21/2014   Hyperlipidemia associated with type 2 diabetes mellitus ([place]) 06/13/2014   Vitamin D deficiency 05/07/2013   Chronic migraine 11/20/2012   S/P Total Abdominal Hysterectomy and Left Salpingo-oophorectomy 10/20/2011   Headache around the eyes    Uncontrolled type 2 diabetes mellitus 07/20/2011   Obesity (BMI 30-39.9) 07/20/2011   Essential hypertension 07/20/2011    Allergies:  Allergies  Allergen Reactions   Bamlanivimab Anaphylaxis, Itching, Palpitations, Rash, Shortness Of Breath and Swelling   Botox [Onabotulinumtoxina] Shortness Of Breath    syncope   Clindamycin/Lincomycin Other (See Comments)   Kiwi Extract Shortness Of Breath   Maxalt [Rizatriptan Benzoate] Anaphylaxis    Chest pain   Nitrous Oxide Shortness Of Breath    syncope   Rizatriptan Benzoate Anaphylaxis    Chest pain   Triptans Shortness Of Breath   Vyepti [Eptinezumab-Jjmr] Shortness Of Breath, Palpitations and Other (See Comments)    Throat soreness, tachycardia   Aspirin Nausea And Vomiting    Mouth blisters   Contrast Media [Iodinated Contrast Media]    Erythromycin    Flagyl [Metronidazole]    Haemophilus Influenzae Vaccines    Latex     Sometimes causes rash   Mango Flavor    Vicodin  [Hydrocodone-Acetaminophen]     hallucinations   Influenza Virus Vaccine Palpitations   Penicillins Nausea And Vomiting, Rash and Other (See Comments)    Did it involve swelling of the face/tongue/throat, SOB, or low BP? No Did it involve sudden or severe rash/hives, skin peeling, or any reaction on the inside of your mouth or nose? No Did you need to seek medical attention at a hospital or doctor's office? Yes When did it last happen?     patient was 51 years old  If all above answers are "NO", may proceed with cephalosporin use.   Tetracyclines & Related Nausea And Vomiting and Rash   Medications:  Current Outpatient Medications:    benzonatate (TESSALON) 100 MG capsule, Take 1-2 capsules (100-200 mg total) by mouth 3 (three) times daily as needed., Disp: 30 capsule, Rfl: 0   oseltamivir (TAMIFLU) 75 MG capsule, Take 1 capsule (75 mg total) by mouth 2 (two) times daily., Disp: 10 capsule, Rfl: 0   predniSONE (DELTASONE) 20 MG tablet, Take 1 tablet (20 mg total) by mouth daily with breakfast., Disp: 7 tablet, Rfl: 0   promethazine-dextromethorphan (PROMETHAZINE-DM) 6.25-15 MG/5ML syrup, Take 5 mLs by mouth 4 (four) times daily as needed., Disp: 118 mL, Rfl: 0   albuterol (VENTOLIN HFA) 108 (90 Base) MCG/ACT inhaler, Inhale 2 puffs into the lungs every 6 (six) hours as  needed for wheezing or shortness of breath., Disp: 18 g, Rfl: 0   ALPRAZolam (XANAX) 0.5 MG tablet, Take 1 tablet (0.5 mg total) by mouth 3 (three) times daily as needed (Headache). (Patient not taking: Reported on 02/22/2022), Disp: , Rfl:    aspirin EC 81 MG tablet, Take 1 tablet (81 mg total) by mouth daily., Disp: 30 tablet, Rfl: 1   Atogepant (QULIPTA) 60 MG TABS, Take 60 mg by mouth daily., Disp: 30 tablet, Rfl: 3   atorvastatin (LIPITOR) 20 MG tablet, Take 1 tablet (20 mg total) by mouth daily., Disp: 90 tablet, Rfl: 3   baclofen (LIORESAL) 10 MG tablet, Take 1 tablet (10 mg total) by mouth 2 (two) times daily as needed for  muscle spasms. baclofen 10 mg tablet Strength: 10 mg, Disp: 60 tablet, Rfl: 3   Blood Glucose Monitoring Suppl (FREESTYLE LITE) DEVI, 1 each by Does not apply route 2 (two) times daily. E11.9, Disp: 1 each, Rfl: 0   Blood Pressure Monitoring (OMRON 3 SERIES BP MONITOR) DEVI, Use as directed, Disp: 1 each, Rfl: 0   butalbital-acetaminophen-caffeine (FIORICET) 50-325-40 MG tablet, Take 1 tablet by mouth every 6 (six) hours as needed for headache. Do not refill in less than 30 day (Patient not taking: Reported on 02/22/2022), Disp: 30 tablet, Rfl: 1   Continuous Blood Gluc Receiver (DEXCOM G6 RECEIVER) DEVI, Use as directed., Disp: 1 each, Rfl: 1   Continuous Blood Gluc Sensor (DEXCOM G7 SENSOR) MISC, 3 each by Does not apply route every 30 (thirty) days. Apply 1 sensor every 10 days, Disp: 9 each, Rfl: 4   Continuous Blood Gluc Transmit (DEXCOM G6 TRANSMITTER) MISC, Use as directed every 90 days., Disp: 1 each, Rfl: 1   dicyclomine (BENTYL) 20 MG tablet, Take 1 tablet (20 mg total) by mouth every 6 (six) hours., Disp: 90 tablet, Rfl: 1   EPINEPHrine 0.3 mg/0.3 mL IJ SOAJ injection, Inject 0.3 mg into the muscle as needed for anaphylaxis., Disp: 1 each, Rfl: 0   ergocalciferol (DRISDOL) 1.25 MG (50000 UT) capsule, Take 1 capsule (50,000 Units total) by mouth once a week., Disp: 4 capsule, Rfl: 2   glucose blood (FREESTYLE LITE) test strip, 1 each by Other route 2 (two) times daily. E11.9, Disp: 200 each, Rfl: 0   insulin degludec (TRESIBA FLEXTOUCH) 200 UNIT/ML FlexTouch Pen, Inject 20 Units into the skin daily., Disp: 9 mL, Rfl: 3   insulin lispro (HUMALOG) 100 UNIT/ML KwikPen, Inject 22-24 Units into the skin 2 (two) times daily with a meal., Disp: 45 mL, Rfl: 3   Insulin Pen Needle (UNIFINE PENTIPS) 32G X 4 MM MISC, Use 1-2x a day, Disp: 200 each, Rfl: 4   Ipratropium-Albuterol (COMBIVENT) 20-100 MCG/ACT AERS respimat, Inhale 1 puff into the lungs every 6 (six) hours., Disp: 4 g, Rfl: 11   Lancets  (FREESTYLE) lancets, 1 each by Other route 2 (two) times daily. E11.9, Disp: 200 each, Rfl: 0   losartan-hydrochlorothiazide (HYZAAR) 50-12.5 MG tablet, Take 1 tablet by mouth daily., Disp: 90 tablet, Rfl: 1   meloxicam (MOBIC) 15 MG tablet, Take 1 tablet (15 mg total) by mouth daily., Disp: 30 tablet, Rfl: 5   metFORMIN (GLUCOPHAGE-XR) 500 MG 24 hr tablet, Take 1 tablet (500 mg total) by mouth daily with supper., Disp: 90 tablet, Rfl: 3   metoprolol succinate (TOPROL-XL) 25 MG 24 hr tablet, Take 0.5 tablets (12.5 mg total) by mouth daily., Disp: 45 tablet, Rfl: 3   ondansetron (ZOFRAN) 8 MG tablet,  Take 1 tablet (8 mg total) by mouth every 8 (eight) hours as needed for nausea or vomiting., Disp: 20 tablet, Rfl: 0   ondansetron (ZOFRAN-ODT) 4 MG disintegrating tablet, Take 1 tablet (4 mg total) by mouth every 8 (eight) hours as needed for nausea or vomiting., Disp: 20 tablet, Rfl: 0   pantoprazole (PROTONIX) 40 MG tablet, Take 1 tablet (40 mg total) by mouth daily. Take only with water and take 30 minutes before eating., Disp: 90 tablet, Rfl: 3   Semaglutide, 2 MG/DOSE, (OZEMPIC, 2 MG/DOSE,) 8 MG/3ML SOPN, Inject 2 mg into the skin once a week., Disp: 9 mL, Rfl: 3   traZODone (DESYREL) 50 MG tablet, Take 0.5-1 tablets (25-50 mg total) by mouth at bedtime as needed for sleep. (Patient not taking: Reported on 02/22/2022), Disp: 90 tablet, Rfl: 0   Ubrogepant (UBRELVY) 100 MG TABS, Take 100 mg by mouth as needed. May repeat a dose in 2 hours if needed. Max dose 2 pills in 2 hours, Disp: 16 tablet, Rfl: 6  Observations/Objective: Patient is well-developed, well-nourished in no acute distress.  Resting comfortably at home. Appears ill Head is normocephalic, atraumatic.  No labored breathing.  Speech is clear and coherent with logical content.  Patient is alert and oriented at baseline.    Assessment and Plan: 1. Flu-like symptoms - albuterol (VENTOLIN HFA) 108 (90 Base) MCG/ACT inhaler; Inhale 2  puffs into the lungs every 6 (six) hours as needed for wheezing or shortness of breath.  Dispense: 18 g; Refill: 0 - predniSONE (DELTASONE) 20 MG tablet; Take 1 tablet (20 mg total) by mouth daily with breakfast.  Dispense: 7 tablet; Refill: 0 - oseltamivir (TAMIFLU) 75 MG capsule; Take 1 capsule (75 mg total) by mouth 2 (two) times daily.  Dispense: 10 capsule; Refill: 0 - promethazine-dextromethorphan (PROMETHAZINE-DM) 6.25-15 MG/5ML syrup; Take 5 mLs by mouth 4 (four) times daily as needed.  Dispense: 118 mL; Refill: 0 - benzonatate (TESSALON) 100 MG capsule; Take 1-2 capsules (100-200 mg total) by mouth 3 (three) times daily as needed.  Dispense: 30 capsule; Refill: 0  2. Cough  - Suspected viral URI with negative covid testing - Possible flu as symptoms are consistent - Limited testing availability that would delay appropriate treatment waiting on results - Tamiflu prescribed for possible flu - Albuterol, prednisone prescribed for asthma - Tessalon perles and Promethazine DM for cough - Push fluids - Symptomatic management OTC of choice as needed - Seek in person evaluation if symptoms worsen or fail to improve   Follow Up Instructions: I discussed the assessment and treatment plan with the patient. The patient was provided an opportunity to ask questions and all were answered. The patient agreed with the plan and demonstrated an understanding of the instructions.  A copy of instructions were sent to the patient via MyChart unless otherwise noted below.     The patient was advised to call back or seek an in-person evaluation if the symptoms worsen or if the condition fails to improve as anticipated.  Time:  I spent 12 minutes with the patient via telehealth technology discussing the above problems/concerns.    Mar Daring, PA-C

## 2022-03-02 NOTE — Patient Instructions (Signed)
[name], thank you for joining Mar Daring, PA-C for today's virtual visit.  While this provider is not your primary care provider (PCP), if your PCP is located in our provider database this encounter information will be shared with them immediately following your visit.  Consent: (Patient) [name] provided verbal consent for this virtual visit at the beginning of the encounter.  Current Medications:  Current Outpatient Medications:    benzonatate (TESSALON) 100 MG capsule, Take 1-2 capsules (100-200 mg total) by mouth 3 (three) times daily as needed., Disp: 30 capsule, Rfl: 0   oseltamivir (TAMIFLU) 75 MG capsule, Take 1 capsule (75 mg total) by mouth 2 (two) times daily., Disp: 10 capsule, Rfl: 0   predniSONE (DELTASONE) 20 MG tablet, Take 1 tablet (20 mg total) by mouth daily with breakfast., Disp: 7 tablet, Rfl: 0   promethazine-dextromethorphan (PROMETHAZINE-DM) 6.25-15 MG/5ML syrup, Take 5 mLs by mouth 4 (four) times daily as needed., Disp: 118 mL, Rfl: 0   albuterol (VENTOLIN HFA) 108 (90 Base) MCG/ACT inhaler, Inhale 2 puffs into the lungs every 6 (six) hours as needed for wheezing or shortness of breath., Disp: 18 g, Rfl: 0   ALPRAZolam (XANAX) 0.5 MG tablet, Take 1 tablet (0.5 mg total) by mouth 3 (three) times daily as needed (Headache). (Patient not taking: Reported on 02/22/2022), Disp: , Rfl:    aspirin EC 81 MG tablet, Take 1 tablet (81 mg total) by mouth daily., Disp: 30 tablet, Rfl: 1   Atogepant (QULIPTA) 60 MG TABS, Take 60 mg by mouth daily., Disp: 30 tablet, Rfl: 3   atorvastatin (LIPITOR) 20 MG tablet, Take 1 tablet (20 mg total) by mouth daily., Disp: 90 tablet, Rfl: 3   baclofen (LIORESAL) 10 MG tablet, Take 1 tablet (10 mg total) by mouth 2 (two) times daily as needed for muscle spasms. baclofen 10 mg tablet Strength: 10 mg, Disp: 60 tablet, Rfl: 3   Blood Glucose Monitoring Suppl (FREESTYLE LITE) DEVI, 1 each by Does not apply route 2 (two) times  daily. E11.9, Disp: 1 each, Rfl: 0   Blood Pressure Monitoring (OMRON 3 SERIES BP MONITOR) DEVI, Use as directed, Disp: 1 each, Rfl: 0   butalbital-acetaminophen-caffeine (FIORICET) 50-325-40 MG tablet, Take 1 tablet by mouth every 6 (six) hours as needed for headache. Do not refill in less than 30 day (Patient not taking: Reported on 02/22/2022), Disp: 30 tablet, Rfl: 1   Continuous Blood Gluc Receiver (DEXCOM G6 RECEIVER) DEVI, Use as directed., Disp: 1 each, Rfl: 1   Continuous Blood Gluc Sensor (DEXCOM G7 SENSOR) MISC, 3 each by Does not apply route every 30 (thirty) days. Apply 1 sensor every 10 days, Disp: 9 each, Rfl: 4   Continuous Blood Gluc Transmit (DEXCOM G6 TRANSMITTER) MISC, Use as directed every 90 days., Disp: 1 each, Rfl: 1   dicyclomine (BENTYL) 20 MG tablet, Take 1 tablet (20 mg total) by mouth every 6 (six) hours., Disp: 90 tablet, Rfl: 1   EPINEPHrine 0.3 mg/0.3 mL IJ SOAJ injection, Inject 0.3 mg into the muscle as needed for anaphylaxis., Disp: 1 each, Rfl: 0   ergocalciferol (DRISDOL) 1.25 MG (50000 UT) capsule, Take 1 capsule (50,000 Units total) by mouth once a week., Disp: 4 capsule, Rfl: 2   glucose blood (FREESTYLE LITE) test strip, 1 each by Other route 2 (two) times daily. E11.9, Disp: 200 each, Rfl: 0   insulin degludec (TRESIBA FLEXTOUCH) 200 UNIT/ML FlexTouch Pen, Inject 20 Units into the skin daily.,  Disp: 9 mL, Rfl: 3   insulin lispro (HUMALOG) 100 UNIT/ML KwikPen, Inject 22-24 Units into the skin 2 (two) times daily with a meal., Disp: 45 mL, Rfl: 3   Insulin Pen Needle (UNIFINE PENTIPS) 32G X 4 MM MISC, Use 1-2x a day, Disp: 200 each, Rfl: 4   Ipratropium-Albuterol (COMBIVENT) 20-100 MCG/ACT AERS respimat, Inhale 1 puff into the lungs every 6 (six) hours., Disp: 4 g, Rfl: 11   Lancets (FREESTYLE) lancets, 1 each by Other route 2 (two) times daily. E11.9, Disp: 200 each, Rfl: 0   losartan-hydrochlorothiazide (HYZAAR) 50-12.5 MG tablet, Take 1 tablet by mouth daily.,  Disp: 90 tablet, Rfl: 1   meloxicam (MOBIC) 15 MG tablet, Take 1 tablet (15 mg total) by mouth daily., Disp: 30 tablet, Rfl: 5   metFORMIN (GLUCOPHAGE-XR) 500 MG 24 hr tablet, Take 1 tablet (500 mg total) by mouth daily with supper., Disp: 90 tablet, Rfl: 3   metoprolol succinate (TOPROL-XL) 25 MG 24 hr tablet, Take 0.5 tablets (12.5 mg total) by mouth daily., Disp: 45 tablet, Rfl: 3   ondansetron (ZOFRAN) 8 MG tablet, Take 1 tablet (8 mg total) by mouth every 8 (eight) hours as needed for nausea or vomiting., Disp: 20 tablet, Rfl: 0   ondansetron (ZOFRAN-ODT) 4 MG disintegrating tablet, Take 1 tablet (4 mg total) by mouth every 8 (eight) hours as needed for nausea or vomiting., Disp: 20 tablet, Rfl: 0   pantoprazole (PROTONIX) 40 MG tablet, Take 1 tablet (40 mg total) by mouth daily. Take only with water and take 30 minutes before eating., Disp: 90 tablet, Rfl: 3   Semaglutide, 2 MG/DOSE, (OZEMPIC, 2 MG/DOSE,) 8 MG/3ML SOPN, Inject 2 mg into the skin once a week., Disp: 9 mL, Rfl: 3   traZODone (DESYREL) 50 MG tablet, Take 0.5-1 tablets (25-50 mg total) by mouth at bedtime as needed for sleep. (Patient not taking: Reported on 02/22/2022), Disp: 90 tablet, Rfl: 0   Ubrogepant (UBRELVY) 100 MG TABS, Take 100 mg by mouth as needed. May repeat a dose in 2 hours if needed. Max dose 2 pills in 2 hours, Disp: 16 tablet, Rfl: 6   Medications ordered in this encounter:  Meds ordered this encounter  Medications   albuterol (VENTOLIN HFA) 108 (90 Base) MCG/ACT inhaler    Sig: Inhale 2 puffs into the lungs every 6 (six) hours as needed for wheezing or shortness of breath.    Dispense:  18 g    Refill:  0    Order Specific Question:   Supervising Provider    Answer:   Chase Picket [[id number]]   predniSONE (DELTASONE) 20 MG tablet    Sig: Take 1 tablet (20 mg total) by mouth daily with breakfast.    Dispense:  7 tablet    Refill:  0    Order Specific Question:   Supervising Provider    Answer:    Chase Picket [[id number]]   oseltamivir (TAMIFLU) 75 MG capsule    Sig: Take 1 capsule (75 mg total) by mouth 2 (two) times daily.    Dispense:  10 capsule    Refill:  0    Order Specific Question:   Supervising Provider    Answer:   Chase Picket [id number]   promethazine-dextromethorphan (PROMETHAZINE-DM) 6.25-15 MG/5ML syrup    Sig: Take 5 mLs by mouth 4 (four) times daily as needed.    Dispense:  118 mL    Refill:  0    Order Specific Question:  Supervising Provider    Answer:   Chase Picket [[id number]]   benzonatate (TESSALON) 100 MG capsule    Sig: Take 1-2 capsules (100-200 mg total) by mouth 3 (three) times daily as needed.    Dispense:  30 capsule    Refill:  0    Order Specific Question:   Supervising Provider    Answer:   Chase Picket [id number]     *If you need refills on other medications prior to your next appointment, please contact your pharmacy*  Follow-Up: Call back or seek an in-person evaluation if the symptoms worsen or if the condition fails to improve as anticipated.  [facility] [phone]  Other Instructions Influenza, Adult Influenza, also called "the flu," is a viral infection that mainly affects the respiratory tract. This includes the lungs, nose, and throat. The flu spreads easily from person to person (is contagious). It causes common cold symptoms, along with high fever and body aches. What are the causes? This condition is caused by the influenza virus. You can get the virus by: Breathing in droplets that are in the air from an infected person's cough or sneeze. Touching something that has the virus on it (has been contaminated) and then touching your mouth, nose, or eyes. What increases the risk? The following factors may make you more likely to get the flu: Not washing or sanitizing your hands often. Having close contact with many people during cold and flu season. Touching your mouth, eyes, or nose without first  washing or sanitizing your hands. Not getting an annual flu shot. You may have a higher risk for the flu, including serious problems, such as a lung infection (pneumonia), if you: Are older than 65. Are pregnant. Have a weakened disease-fighting system (immune system). This includes people who have HIV or AIDS, are on chemotherapy, or are taking medicines that reduce (suppress) the immune system. Have a long-term (chronic) illness, such as heart disease, kidney disease, diabetes, or lung disease. Have a liver disorder. Are severely overweight (morbidly obese). Have anemia. Have asthma. What are the signs or symptoms? Symptoms of this condition usually begin suddenly and last 4-14 days. These may include: Fever and chills. Headaches, body aches, or muscle aches. Sore throat. Cough. Runny or stuffy (congested) nose. Chest discomfort. Poor appetite. Weakness or fatigue. Dizziness. Nausea or vomiting. How is this diagnosed? This condition may be diagnosed based on: Your symptoms and medical history. A physical exam. Swabbing your nose or throat and testing the fluid for the influenza virus. How is this treated? If the flu is diagnosed early, you can be treated with antiviral medicine that is given by mouth (orally) or through an IV. This can help reduce how severe the illness is and how long it lasts. Taking care of yourself at home can help relieve symptoms. Your health care provider may recommend: Taking over-the-counter medicines. Drinking plenty of fluids. In many cases, the flu goes away on its own. If you have severe symptoms or complications, you may be treated in a hospital. Follow these instructions at home: Activity Rest as needed and get plenty of sleep. Stay home from work or school as told by your health care provider. Unless you are visiting your health care provider, avoid leaving home until your fever has been gone for 24 hours without taking medicine. Eating and  drinking Take an oral rehydration solution (ORS). This is a drink that is sold at pharmacies and retail stores. Drink enough fluid to  keep your urine pale yellow. Drink clear fluids in small amounts as you are able. Clear fluids include water, ice chips, fruit juice mixed with water, and low-calorie sports drinks. Eat bland, easy-to-digest foods in small amounts as you are able. These foods include bananas, applesauce, rice, lean meats, toast, and crackers. Avoid drinking fluids that contain a lot of sugar or caffeine, such as energy drinks, regular sports drinks, and soda. Avoid alcohol. Avoid spicy or fatty foods. General instructions     Take over-the-counter and prescription medicines only as told by your health care provider. Use a cool mist humidifier to add humidity to the air in your home. This can make it easier to breathe. When using a cool mist humidifier, clean it daily. Empty the water and replace it with clean water. Cover your mouth and nose when you cough or sneeze. Wash your hands with soap and water often and for at least 20 seconds, especially after you cough or sneeze. If soap and water are not available, use alcohol-based hand sanitizer. Keep all follow-up visits. This is important. How is this prevented?  Get an annual flu shot. This is usually available in late summer, fall, or winter. Ask your health care provider when you should get your flu shot. Avoid contact with people who are sick during cold and flu season. This is generally fall and winter. Contact a health care provider if: You develop new symptoms. You have: Chest pain. Diarrhea. A fever. Your cough gets worse. You produce more mucus. You feel nauseous or you vomit. Get help right away if you: Develop shortness of breath or have difficulty breathing. Have skin or nails that turn a bluish color. Have severe pain or stiffness in your neck. Develop a sudden headache or sudden pain in your face or  ear. Cannot eat or drink without vomiting. These symptoms may represent a serious problem that is an emergency. Do not wait to see if the symptoms will go away. Get medical help right away. Call your local emergency services (911 in the [place] Do not drive yourself to the hospital. Summary Influenza, also called "the flu," is a viral infection that primarily affects your respiratory tract. Symptoms of the flu usually begin suddenly and last 4-14 days. Getting an annual flu shot is the best way to prevent getting the flu. Stay home from work or school as told by your health care provider. Unless you are visiting your health care provider, avoid leaving home until your fever has been gone for 24 hours without taking medicine. Keep all follow-up visits. This is important. This information is not intended to replace advice given to you by your health care provider. Make sure you discuss any questions you have with your health care provider. Document Revised: 01/10/2020 Document Reviewed: 01/10/2020 Elsevier Patient Education  [employer].    If you have been instructed to have an in-person evaluation today at a local Urgent Care facility, please use the link below. It will take you to a list of all of our available [facility], including address, phone number and hours of operation. Please do not delay care.  [facility]  If you or a family member do not have a primary care provider, use the link below to schedule a visit and establish care. When you choose a [facility] primary care physician or advanced practice provider, you gain a long-term partner in health. Find a Primary Care Provider  Learn more about [facility]  in-office and virtual care options: [facility]

## 2022-03-05 ENCOUNTER — Telehealth: Payer: 59 | Admitting: Family

## 2022-03-05 DIAGNOSIS — R0602 Shortness of breath: Secondary | ICD-10-CM

## 2022-03-05 DIAGNOSIS — J4521 Mild intermittent asthma with (acute) exacerbation: Secondary | ICD-10-CM

## 2022-03-05 NOTE — Progress Notes (Signed)
Virtual Visit Consent   [name], you are scheduled for a virtual visit with a [name] provider today. Just as with appointments in the office, your consent must be obtained to participate. Your consent will be active for this visit and any virtual visit you may have with one of our providers in the next 365 days. If you have a MyChart account, a copy of this consent can be sent to you electronically.  As this is a virtual visit, video technology does not allow for your provider to perform a traditional examination. This may limit your provider's ability to fully assess your condition. If your provider identifies any concerns that need to be evaluated in person or the need to arrange testing (such as labs, EKG, etc.), we will make arrangements to do so. Although advances in technology are sophisticated, we cannot ensure that it will always work on either your end or our end. If the connection with a video visit is poor, the visit may have to be switched to a telephone visit. With either a video or telephone visit, we are not always able to ensure that we have a secure connection.  By engaging in this virtual visit, you consent to the provision of healthcare and authorize for your insurance to be billed (if applicable) for the services provided during this visit. Depending on your insurance coverage, you may receive a charge related to this service.  I need to obtain your verbal consent now. Are you willing to proceed with your visit today? [name] has provided verbal consent on 03/05/2022 for a virtual visit (video or telephone). Stephanie Dun, FNP  Date: 03/05/2022 9:58 AM  Virtual Visit via Video Note   I, [name], connected with  [name]  ([id number], 08-19-1970) on 03/05/22 at 10:00 AM EDT by a video-enabled telemedicine application and verified that I am speaking with the correct person using two identifiers.  Location: Patient: Virtual Visit Location Patient:  Home Provider: Virtual Visit Location Provider: Home Office   I discussed the limitations of evaluation and management by telemedicine and the availability of in person appointments. The patient expressed understanding and agreed to proceed.    History of Present Illness: [name] is a 51 y.o. who identifies as a female who was assigned female at birth, and is being seen today for chest tightness. She has asthma.   She has a video visit on 03/02/22 and was treated for flu like symptoms and given prednisone, tamiflu, tessalon, and promethazine-dm cough syrup. She has been using her albuterol 6 times a day. Reports SOB, wheezing with mild exertion and talking.   HPI: Asthma She complains of chest tightness, cough, difficulty breathing, frequent throat clearing, hoarse voice, shortness of breath and wheezing. This is a recurrent problem. The current episode started 1 to 4 weeks ago. Her symptoms are aggravated by exercise. Her symptoms are alleviated by rest. She reports minimal improvement on treatment. Her past medical history is significant for asthma.    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 09/21/2021   Chronic left shoulder pain 09/21/2021   Sinus tachycardia 06/23/2021   Fatty infiltration of liver 07/26/2020   Diarrhea, functional 06/22/2020   Uveitis, intermediate, bilateral 09/07/2019   Generalized edema 06/13/2019   Abnormal electrocardiogram (ECG) (EKG) 06/07/2019   Hospital discharge follow-up 06/07/2019   Postviral fatigue syndrome 05/30/2019   Multinodular goiter  12/05/2017   Multiple thyroid nodules 12/05/2017   Microalbuminuria due to type 2 diabetes mellitus ([place]) 11/26/2017   Encounter for preventive health examination 11/26/2017   Menopause syndrome 06/10/2017   Adverse reaction to vaccine, sequela 03/25/2017   Palpitations 02/27/2017   PAC (premature atrial  contraction) 02/27/2017   PVC's (premature ventricular contractions) 02/27/2017   PSVT (paroxysmal supraventricular tachycardia) ([place]) 11/26/2016   Nontraumatic incomplete tear of right rotator cuff 07/14/2016   AC (acromioclavicular) arthritis 07/14/2016   Impingement syndrome of right shoulder 07/14/2016   Fibrocystic breast changes, right 05/21/2016   Menopausal symptoms 10/30/2015   Insomnia 10/16/2015   Snoring 07/26/2015   Chronic migraine w/o aura w/o status migrainosus, not intractable 02/24/2015   History of concussion 07/21/2014   Hyperlipidemia associated with type 2 diabetes mellitus ([place]) 06/13/2014   Vitamin D deficiency 05/07/2013   Chronic migraine 11/20/2012   S/P Total Abdominal Hysterectomy and Left Salpingo-oophorectomy 10/20/2011   Headache around the eyes    Uncontrolled type 2 diabetes mellitus 07/20/2011   Obesity (BMI 30-39.9) 07/20/2011   Essential hypertension 07/20/2011    Allergies:  Allergies  Allergen Reactions   Bamlanivimab Anaphylaxis, Itching, Palpitations, Rash, Shortness Of Breath and Swelling   Botox [Onabotulinumtoxina] Shortness Of Breath    syncope   Clindamycin/Lincomycin Other (See Comments)   Kiwi Extract Shortness Of Breath   Maxalt [Rizatriptan Benzoate] Anaphylaxis    Chest pain   Nitrous Oxide Shortness Of Breath    syncope   Rizatriptan Benzoate Anaphylaxis    Chest pain   Triptans Shortness Of Breath   Vyepti [Eptinezumab-Jjmr] Shortness Of Breath, Palpitations and Other (See Comments)    Throat soreness, tachycardia   Aspirin Nausea And Vomiting    Mouth blisters   Contrast Media [Iodinated Contrast Media]    Erythromycin    Flagyl [Metronidazole]    Haemophilus Influenzae Vaccines    Latex     Sometimes causes rash   Mango Flavor    Vicodin [Hydrocodone-Acetaminophen]     hallucinations   Influenza Virus Vaccine Palpitations   Penicillins Nausea And Vomiting, Rash and Other (See Comments)    Did it involve swelling  of the face/tongue/throat, SOB, or low BP? No Did it involve sudden or severe rash/hives, skin peeling, or any reaction on the inside of your mouth or nose? No Did you need to seek medical attention at a hospital or doctor's office? Yes When did it last happen?     patient was 51 years old  If all above answers are "NO", may proceed with cephalosporin use.   Tetracyclines & Related Nausea And Vomiting and Rash   Medications:  Current Outpatient Medications:    albuterol (VENTOLIN HFA) 108 (90 Base) MCG/ACT inhaler, Inhale 2 puffs into the lungs every 6 (six) hours as needed for wheezing or shortness of breath., Disp: 18 g, Rfl: 0   aspirin EC 81 MG tablet, Take 1 tablet (81 mg total) by mouth daily., Disp: 30 tablet, Rfl: 1   Atogepant (QULIPTA) 60 MG TABS, Take 60 mg by mouth daily., Disp: 30 tablet, Rfl: 3   atorvastatin (LIPITOR) 20 MG tablet, Take 1 tablet (20 mg total) by mouth daily., Disp: 90 tablet, Rfl: 3   baclofen (LIORESAL) 10 MG tablet, Take 1 tablet (10 mg total) by mouth 2 (two) times daily as needed for muscle spasms. baclofen 10 mg tablet Strength: 10 mg, Disp: 60 tablet, Rfl: 3   benzonatate (TESSALON) 100 MG capsule, Take 1-2 capsules (100-200 mg total) by  mouth 3 (three) times daily as needed., Disp: 30 capsule, Rfl: 0   Blood Glucose Monitoring Suppl (FREESTYLE LITE) DEVI, 1 each by Does not apply route 2 (two) times daily. E11.9, Disp: 1 each, Rfl: 0   Blood Pressure Monitoring (OMRON 3 SERIES BP MONITOR) DEVI, Use as directed, Disp: 1 each, Rfl: 0   butalbital-acetaminophen-caffeine (FIORICET) 50-325-40 MG tablet, Take 1 tablet by mouth every 6 (six) hours as needed for headache. Do not refill in less than 30 day (Patient not taking: Reported on 02/22/2022), Disp: 30 tablet, Rfl: 1   Continuous Blood Gluc Receiver (DEXCOM G6 RECEIVER) DEVI, Use as directed., Disp: 1 each, Rfl: 1   Continuous Blood Gluc Sensor (DEXCOM G7 SENSOR) MISC, 3 each by Does not apply route every 30  (thirty) days. Apply 1 sensor every 10 days, Disp: 9 each, Rfl: 4   Continuous Blood Gluc Transmit (DEXCOM G6 TRANSMITTER) MISC, Use as directed every 90 days., Disp: 1 each, Rfl: 1   dicyclomine (BENTYL) 20 MG tablet, Take 1 tablet (20 mg total) by mouth every 6 (six) hours., Disp: 90 tablet, Rfl: 1   EPINEPHrine 0.3 mg/0.3 mL IJ SOAJ injection, Inject 0.3 mg into the muscle as needed for anaphylaxis., Disp: 1 each, Rfl: 0   ergocalciferol (DRISDOL) 1.25 MG (50000 UT) capsule, Take 1 capsule (50,000 Units total) by mouth once a week., Disp: 4 capsule, Rfl: 2   glucose blood (FREESTYLE LITE) test strip, 1 each by Other route 2 (two) times daily. E11.9, Disp: 200 each, Rfl: 0   insulin degludec (TRESIBA FLEXTOUCH) 200 UNIT/ML FlexTouch Pen, Inject 20 Units into the skin daily., Disp: 9 mL, Rfl: 3   insulin lispro (HUMALOG) 100 UNIT/ML KwikPen, Inject 22-24 Units into the skin 2 (two) times daily with a meal., Disp: 45 mL, Rfl: 3   Insulin Pen Needle (UNIFINE PENTIPS) 32G X 4 MM MISC, Use 1-2x a day, Disp: 200 each, Rfl: 4   Ipratropium-Albuterol (COMBIVENT) 20-100 MCG/ACT AERS respimat, Inhale 1 puff into the lungs every 6 (six) hours., Disp: 4 g, Rfl: 11   Lancets (FREESTYLE) lancets, 1 each by Other route 2 (two) times daily. E11.9, Disp: 200 each, Rfl: 0   losartan-hydrochlorothiazide (HYZAAR) 50-12.5 MG tablet, Take 1 tablet by mouth daily., Disp: 90 tablet, Rfl: 1   meloxicam (MOBIC) 15 MG tablet, Take 1 tablet (15 mg total) by mouth daily., Disp: 30 tablet, Rfl: 5   metFORMIN (GLUCOPHAGE-XR) 500 MG 24 hr tablet, Take 1 tablet (500 mg total) by mouth daily with supper., Disp: 90 tablet, Rfl: 3   metoprolol succinate (TOPROL-XL) 25 MG 24 hr tablet, Take 0.5 tablets (12.5 mg total) by mouth daily., Disp: 45 tablet, Rfl: 3   ondansetron (ZOFRAN) 8 MG tablet, Take 1 tablet (8 mg total) by mouth every 8 (eight) hours as needed for nausea or vomiting., Disp: 20 tablet, Rfl: 0   ondansetron (ZOFRAN-ODT)  4 MG disintegrating tablet, Take 1 tablet (4 mg total) by mouth every 8 (eight) hours as needed for nausea or vomiting., Disp: 20 tablet, Rfl: 0   pantoprazole (PROTONIX) 40 MG tablet, Take 1 tablet (40 mg total) by mouth daily. Take only with water and take 30 minutes before eating., Disp: 90 tablet, Rfl: 3   predniSONE (DELTASONE) 20 MG tablet, Take 1 tablet (20 mg total) by mouth daily with breakfast., Disp: 7 tablet, Rfl: 0   promethazine-dextromethorphan (PROMETHAZINE-DM) 6.25-15 MG/5ML syrup, Take 5 mLs by mouth 4 (four) times daily as needed., Disp: 118  mL, Rfl: 0   Semaglutide, 2 MG/DOSE, (OZEMPIC, 2 MG/DOSE,) 8 MG/3ML SOPN, Inject 2 mg into the skin once a week., Disp: 9 mL, Rfl: 3   Ubrogepant (UBRELVY) 100 MG TABS, Take 100 mg by mouth as needed. May repeat a dose in 2 hours if needed. Max dose 2 pills in 2 hours, Disp: 16 tablet, Rfl: 6  Observations/Objective: Patient is well-developed, well-nourished in no acute distress.  Laying in bed  Head is normocephalic, atraumatic.  Hoarse voice, mild SOB noted Speech is clear and coherent with logical content.  Patient is alert and oriented at baseline.    Assessment and Plan: 1. SOB (shortness of breath)  2. Mild intermittent asthma with exacerbation  Given symptoms are not improving and having SOB with any exertion recommend her going to be seen in person. She need chest x-ray.  She is uncontrolled DM and states she can not tolerate steroid injection.     Follow Up Instructions: I discussed the assessment and treatment plan with the patient. The patient was provided an opportunity to ask questions and all were answered. The patient agreed with the plan and demonstrated an understanding of the instructions.  A copy of instructions were sent to the patient via MyChart unless otherwise noted below.     The patient was advised to call back or seek an in-person evaluation if the symptoms worsen or if the condition fails to improve as  anticipated.  Time:  I spent 10 minutes with the patient via telehealth technology discussing the above problems/concerns.    Stephanie Dun, FNP

## 2022-03-06 ENCOUNTER — Ambulatory Visit: Payer: 59

## 2022-03-07 ENCOUNTER — Encounter: Payer: Self-pay | Admitting: Psychiatry

## 2022-03-07 ENCOUNTER — Ambulatory Visit: Payer: 59

## 2022-03-07 NOTE — Telephone Encounter (Signed)
Forms completed, placed on MD desk for review and signature.  

## 2022-03-07 NOTE — Telephone Encounter (Signed)
FMLA forms received, are you willing to complete? If so, How would you like to accommodate pt ? Continuous, reduced schedule or intermittent leave? & how often ?

## 2022-03-07 NOTE — Telephone Encounter (Signed)
We can do intermittent leave for 4 days per month, thanks

## 2022-03-08 DIAGNOSIS — Z0289 Encounter for other administrative examinations: Secondary | ICD-10-CM

## 2022-03-08 NOTE — Telephone Encounter (Signed)
Forms completed, faxed back to [phone], confirmation received. Forms placed in medical records for pick up

## 2022-03-08 NOTE — Telephone Encounter (Signed)
FMLA paperwork faxed to [employer] on 03/08/22

## 2022-03-09 ENCOUNTER — Ambulatory Visit: Payer: 59

## 2022-03-15 ENCOUNTER — Ambulatory Visit: Payer: 59 | Attending: Psychiatry

## 2022-03-15 ENCOUNTER — Other Ambulatory Visit: Payer: Self-pay

## 2022-03-22 ENCOUNTER — Encounter: Payer: Self-pay | Admitting: Internal Medicine

## 2022-03-22 ENCOUNTER — Other Ambulatory Visit: Payer: Self-pay

## 2022-03-22 DIAGNOSIS — E1142 Type 2 diabetes mellitus with diabetic polyneuropathy: Secondary | ICD-10-CM

## 2022-03-23 ENCOUNTER — Other Ambulatory Visit: Payer: Self-pay

## 2022-03-23 MED ORDER — OZEMPIC (1 MG/DOSE) 4 MG/3ML ~~LOC~~ SOPN
1.0000 mg | PEN_INJECTOR | SUBCUTANEOUS | 5 refills | Status: DC
  Filled 2022-03-23 (×2): qty 3, 28d supply, fill #0

## 2022-03-24 ENCOUNTER — Other Ambulatory Visit: Payer: Self-pay

## 2022-03-28 ENCOUNTER — Telehealth: Payer: Self-pay | Admitting: *Deleted

## 2022-03-28 NOTE — Telephone Encounter (Signed)
Hecker PA, Key: [id number].  Your information has been sent to [place].

## 2022-03-28 NOTE — Telephone Encounter (Signed)
Qulipta approved. The authorization is effective from 03/28/2022 to 03/28/2023, as long as you are enrolled as a member of your current health plan.   The request was approved with a quantity restriction. This has been approved for a max daily dosage of 1.

## 2022-03-31 ENCOUNTER — Other Ambulatory Visit: Payer: Self-pay | Admitting: Internal Medicine

## 2022-03-31 DIAGNOSIS — Z1231 Encounter for screening mammogram for malignant neoplasm of breast: Secondary | ICD-10-CM

## 2022-04-01 ENCOUNTER — Other Ambulatory Visit: Payer: Self-pay

## 2022-04-05 ENCOUNTER — Ambulatory Visit (INDEPENDENT_AMBULATORY_CARE_PROVIDER_SITE_OTHER): Payer: 59 | Admitting: Internal Medicine

## 2022-04-05 ENCOUNTER — Encounter: Payer: Self-pay | Admitting: Internal Medicine

## 2022-04-05 ENCOUNTER — Ambulatory Visit: Payer: 59

## 2022-04-05 VITALS — BP 110/70 | HR 123 | Ht 66.0 in | Wt 192.2 lb

## 2022-04-05 DIAGNOSIS — E1165 Type 2 diabetes mellitus with hyperglycemia: Secondary | ICD-10-CM | POA: Diagnosis not present

## 2022-04-05 DIAGNOSIS — E1169 Type 2 diabetes mellitus with other specified complication: Secondary | ICD-10-CM

## 2022-04-05 DIAGNOSIS — E785 Hyperlipidemia, unspecified: Secondary | ICD-10-CM | POA: Diagnosis not present

## 2022-04-05 DIAGNOSIS — E1142 Type 2 diabetes mellitus with diabetic polyneuropathy: Secondary | ICD-10-CM | POA: Diagnosis not present

## 2022-04-05 NOTE — Patient Instructions (Addendum)
Please continue: - Ozempic 1 mg weekly  If the sugars increase, try to restart: - Metformin ER 500 mg 1x a day  Or, go back to:  - Ozempic 2 mg week  Please return in 4 months.

## 2022-04-05 NOTE — Progress Notes (Signed)
Patient ID: [name], female   DOB: Jul 27, 1970, 51 y.o.   MRN: [id number]  HPI: [name] is a 51 y.o.-year-old female, returning for follow-up for DM2, dx in 2013, with history of GDM in 1990 and 2004, insulin-dependent since 2018, uncontrolled, with complications (mild CKD, PN).  She was previously seen by Dr. Loanne Drilling. Last visit with me 2 months ago.  Interim history: No increased urination, blurry vision, nausea, chest pain.  She continues to lose weight, on Ozempic. Will have to decrease the dose of Ozempic to only 1 mg weekly - due to shortage at the pharmacy. She feels that she has more lows recently.  Reviewed HbA1c: Lab Results  Component Value Date   HGBA1C 6.5 (A) 01/28/2022   HGBA1C 8.1 (A) 10/19/2021   HGBA1C 7.5 (A) 08/03/2021   HGBA1C 7.6 (A) 05/03/2021   HGBA1C 7.0 (A) 01/26/2021   HGBA1C 6.9 (A) 10/26/2020   HGBA1C 6.1 (A) 07/28/2020   HGBA1C 6.0 (A) 01/30/2020   HGBA1C 6.6 (A) 12/02/2019   HGBA1C 7.3 (A) 08/15/2019   Previous on: - Ozempic 2 mg weekly - Humalog 22 -24 (30) units 2x a day after meals (patient has mostly 2 meals a day) She tried Victoza >> dysphagia. She tried Jardiance >> yeast infections. She was taking Metformin 11/2020 after he had IBS symptoms.  Now on: - Ozempic 2 >> 1 mg weekly -  N/V/D with higher doses >> stopped - >> now off  Pt checks her sugars more than 4 times a day with her Dexcom G6 CGM:  Previously:  Previously:   Lowest sugar was 70s >> 74 >> 60s (felt it); she has hypoglycemia awareness at 70.  Highest sugar was 300s >> 201 (candy) >> 220s.  -+ Stage IIIa CKD, last BUN/creatinine:  Lab Results  Component Value Date   BUN 14 09/21/2021   BUN 13 03/15/2021   CREATININE 1.12 09/21/2021   CREATININE 1.09 (H) 03/15/2021  She is not on ACE inhibitor/ARB.  -+ HL; last set of lipids: Lab Results  Component Value Date   CHOL 141 09/21/2021   HDL 34.20 (L) 09/21/2021   LDLCALC 83 04/23/2018   LDLDIRECT  88.0 09/21/2021   TRIG 248.0 (H) 09/21/2021   CHOLHDL 4 09/21/2021  On Lipitor 20 mg daily.  - last eye exam was in 08/2021. No DR reportedly.   - + numbness and tingling in her feet. Last foot exam: 09/2021.  On ASA 81.  Patient also has a history of HTN, fatty liver, IBS.  ROS: + see HPI No increased urination, + blurry vision, no nausea, chest pain.  Past Medical History:  Diagnosis Date   Allergy    Anemia    Anxiety    claustrophobic   Asthma    Back pain    Biceps tendonosis of right shoulder    Cataract    Mixed OU   COVID-19    covid PNA hospitalized 2020, 06/06/21   Diabetes mellitus 2011   did not start metforfin, losing weight   Dyspnea    Fatty liver    Food allergy    Frozen shoulder    left, had steroid injection 10/05/21   Headache disorder    History of concussion    HLD (hyperlipidemia)    Hypertension    Hypertensive retinopathy    OU   IBS (irritable bowel syndrome)    Infertility, female    Joint pain    Lactose intolerance    Leg edema  Migraines    Palpitation    Post-menopausal    Seborrheic dermatitis    Shoulder impingement syndrome, right    Uveitis    [facility] eye est in 2020 seen specialist in [place] [facility] last seen 11 or 05/2020   Vitamin D deficiency    Past Surgical History:  Procedure Laterality Date   ABDOMINAL HYSTERECTOMY  2006   heavy menses, endometriosis, l oophrectomy   BACK SURGERY     BREAST BIOPSY Right 2018   benign   BREAST EXCISIONAL BIOPSY Right 2003   benign   BREAST EXCISIONAL BIOPSY Right 1999   benign   BREAST SURGERY     right breast x 2 , benign   COLONOSCOPY  10/06/2020   HERNIA REPAIR  2003   left inguinal    LEFT OOPHORECTOMY     SHOULDER ARTHROSCOPY WITH SUBACROMIAL DECOMPRESSION AND OPEN ROTATOR C Right 07/14/2016   Procedure: RIGHT SHOULDER ARTHROSCOPY WITH SUBACROMIAL DECOMPRESSION, DISTAL CLAVICLE RESECTION AND MINI OPEN ROTATOR CUFF REPAIR, OPEN BICEP TENDODESIS;  Surgeon: Garald Balding, MD;  Location: [facility];  Service: Orthopedics;  Laterality: Right;   SHOULDER CLOSED REDUCTION Right 09/08/2016   Procedure: RIGHT CLOSED MANIPULATION SHOULDER;  Surgeon: Garald Balding, MD;  Location: [facility];  Service: Orthopedics;  Laterality: Right;   TUBAL LIGATION     UPPER GASTROINTESTINAL ENDOSCOPY  10/06/2020   Social History   Socioeconomic History   Marital status: Divorced    Spouse name: Not on file   Number of children: 2   Years of education: 14   Highest education level: Master's degree (e.g., MA, MS, MEng, MEd, MSW, MBA)  Occupational History   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   Right-handed.   1-3 cups caffeine weekly.   Social Determinants of Health   Financial Resource Strain: Not on file  Food Insecurity: Not on file  Transportation Needs: Not on file  Physical Activity: Not on file  Stress: Not on file  Social Connections: Not on file  Intimate Partner Violence: Not on file   Current Outpatient Medications on File Prior to Visit  Medication Sig Dispense Refill   albuterol (VENTOLIN HFA) 108 (90 Base) MCG/ACT inhaler Inhale 2 puffs into the lungs every 6 (six) hours as needed for wheezing or shortness of breath. 18 g 0   aspirin EC 81 MG tablet Take 1 tablet (81 mg total) by mouth daily. 30 tablet 1   Atogepant (QULIPTA) 60 MG TABS Take 60 mg by mouth daily. 30 tablet 3   atorvastatin (LIPITOR) 20 MG tablet Take 1 tablet (20 mg total) by mouth daily. 90 tablet 3   baclofen (LIORESAL) 10 MG tablet Take 1 tablet (10 mg total) by mouth  2 (two) times daily as needed for muscle spasms. baclofen 10 mg tablet Strength: 10 mg 60 tablet 3   benzonatate (TESSALON) 100 MG capsule Take 1-2 capsules (100-200 mg total) by mouth 3 (three) times daily as needed. 30 capsule 0  Blood Glucose Monitoring Suppl (FREESTYLE LITE) DEVI 1 each by Does not apply route 2 (two) times daily. E11.9 1 each 0   Blood Pressure Monitoring (OMRON 3 SERIES BP MONITOR) DEVI Use as directed 1 each 0   butalbital-acetaminophen-caffeine (FIORICET) 50-325-40 MG tablet Take 1 tablet by mouth every 6 (six) hours as needed for headache. Do not refill in less than 30 day (Patient not taking: Reported on 02/22/2022) 30 tablet 1   Continuous Blood Gluc Receiver (DEXCOM G6 RECEIVER) DEVI Use as directed. 1 each 1   Continuous Blood Gluc Sensor (DEXCOM G7 SENSOR) MISC 3 each by Does not apply route every 30 (thirty) days. Apply 1 sensor every 10 days 9 each 4   Continuous Blood Gluc Transmit (DEXCOM G6 TRANSMITTER) MISC Use as directed every 90 days. 1 each 1   dicyclomine (BENTYL) 20 MG tablet Take 1 tablet (20 mg total) by mouth every 6 (six) hours. 90 tablet 1   EPINEPHrine 0.3 mg/0.3 mL IJ SOAJ injection Inject 0.3 mg into the muscle as needed for anaphylaxis. 1 each 0   ergocalciferol (DRISDOL) 1.25 MG (50000 UT) capsule Take 1 capsule (50,000 Units total) by mouth once a week. 4 capsule 2   glucose blood (FREESTYLE LITE) test strip 1 each by Other route 2 (two) times daily. E11.9 200 each 0   insulin degludec (TRESIBA FLEXTOUCH) 200 UNIT/ML FlexTouch Pen Inject 20 Units into the skin daily. 9 mL 3   insulin lispro (HUMALOG) 100 UNIT/ML KwikPen Inject 22-24 Units into the skin 2 (two) times daily with a meal. 45 mL 3   Insulin Pen Needle (UNIFINE PENTIPS) 32G X 4 MM MISC Use 1-2x a day 200 each 4   Ipratropium-Albuterol (COMBIVENT) 20-100 MCG/ACT AERS respimat Inhale 1 puff into the lungs every 6 (six) hours. 4 g 11   Lancets (FREESTYLE) lancets 1 each by Other route 2  (two) times daily. E11.9 200 each 0   losartan-hydrochlorothiazide (HYZAAR) 50-12.5 MG tablet Take 1 tablet by mouth daily. 90 tablet 1   meloxicam (MOBIC) 15 MG tablet Take 1 tablet (15 mg total) by mouth daily. 30 tablet 5   metFORMIN (GLUCOPHAGE-XR) 500 MG 24 hr tablet Take 1 tablet (500 mg total) by mouth daily with supper. 90 tablet 3   metoprolol succinate (TOPROL-XL) 25 MG 24 hr tablet Take 0.5 tablets (12.5 mg total) by mouth daily. 45 tablet 3   ondansetron (ZOFRAN) 8 MG tablet Take 1 tablet (8 mg total) by mouth every 8 (eight) hours as needed for nausea or vomiting. 20 tablet 0   ondansetron (ZOFRAN-ODT) 4 MG disintegrating tablet Take 1 tablet (4 mg total) by mouth every 8 (eight) hours as needed for nausea or vomiting. 20 tablet 0   pantoprazole (PROTONIX) 40 MG tablet Take 1 tablet (40 mg total) by mouth daily. Take only with water and take 30 minutes before eating. 90 tablet 3   predniSONE (DELTASONE) 20 MG tablet Take 1 tablet (20 mg total) by mouth daily with breakfast. 7 tablet 0   promethazine-dextromethorphan (PROMETHAZINE-DM) 6.25-15 MG/5ML syrup Take 5 mLs by mouth 4 (four) times daily as needed. 118 mL 0   Semaglutide, 1 MG/DOSE, (OZEMPIC, 1 MG/DOSE,) 4 MG/3ML SOPN Inject 1 mg into the skin once a week. 3 mL 5   Semaglutide, 2 MG/DOSE, (OZEMPIC, 2 MG/DOSE,) 8 MG/3ML SOPN Inject 2 mg into the skin once a week. 9 mL 3   Ubrogepant (UBRELVY) 100 MG TABS Take 100 mg by mouth as needed.  May repeat a dose in 2 hours if needed. Max dose 2 pills in 2 hours 16 tablet 6   No current facility-administered medications on file prior to visit.   Allergies  Allergen Reactions   Bamlanivimab Anaphylaxis, Itching, Palpitations, Rash, Shortness Of Breath and Swelling   Botox [Onabotulinumtoxina] Shortness Of Breath    syncope   Clindamycin/Lincomycin Other (See Comments)   Kiwi Extract Shortness Of Breath   Maxalt [Rizatriptan Benzoate] Anaphylaxis    Chest pain   Nitrous Oxide  Shortness Of Breath    syncope   Rizatriptan Benzoate Anaphylaxis    Chest pain   Triptans Shortness Of Breath   Vyepti [Eptinezumab-Jjmr] Shortness Of Breath, Palpitations and Other (See Comments)    Throat soreness, tachycardia   Aspirin Nausea And Vomiting    Mouth blisters   Contrast Media [Iodinated Contrast Media]    Erythromycin    Flagyl [Metronidazole]    Haemophilus Influenzae Vaccines    Latex     Sometimes causes rash   Mango Flavor    Vicodin [Hydrocodone-Acetaminophen]     hallucinations   Influenza Virus Vaccine Palpitations   Penicillins Nausea And Vomiting, Rash and Other (See Comments)    Did it involve swelling of the face/tongue/throat, SOB, or low BP? No Did it involve sudden or severe rash/hives, skin peeling, or any reaction on the inside of your mouth or nose? No Did you need to seek medical attention at a hospital or doctor's office? Yes When did it last happen?     patient was 51 years old  If all above answers are "NO", may proceed with cephalosporin use.   Tetracyclines & Related Nausea And Vomiting and Rash   Family History  Problem Relation Age of Onset   Diabetes Mother    Heart disease Mother 54   Hypertension Mother    Hyperlipidemia Mother    Heart failure Mother    Stroke Mother    Thyroid disease Mother    Depression Mother    Sleep apnea Mother    Obesity Mother    Colon polyps Mother    Cancer Father 62       Lung Cancer   Heart disease Father    Mental illness Sister        bipolar, substance abuse,  clean 2 yrs   Hypertension Sister    Cancer Paternal Grandmother 82       breast cancer   Breast cancer Paternal Grandmother    Diabetes Maternal Grandmother    Hypertension Maternal Grandmother    Diabetes Son    Colon cancer Neg Hx    Esophageal cancer Neg Hx    Rectal cancer Neg Hx    Stomach cancer Neg Hx    PE: BP 110/70 (BP Location: Left Arm, Patient Position: Sitting, Cuff Size: Normal)   Pulse (!) 123   Ht '5\' 6"'$   (1.676 m)   Wt 192 lb 3.2 oz (87.2 kg)   SpO2 (!) 89%   BMI 31.02 kg/m  Wt Readings from Last 3 Encounters:  04/05/22 192 lb 3.2 oz (87.2 kg)  02/22/22 200 lb (90.7 kg)  01/28/22 201 lb 12.8 oz (91.5 kg)   Constitutional: overweight, in NAD Eyes: no exophthalmos ENT: no masses palpated in neck, no cervical lymphadenopathy Cardiovascular: RRR, No MRG Respiratory: CTA B Musculoskeletal: no deformities Skin: no rashes Neurological: no tremor with outstretched hands  ASSESSMENT: 1. DM2, insulin-dependent, uncontrolled, with complications - CKD - mild - PN  2. HL  3.  Obesity class I  PLAN:  1. Patient with longstanding, uncontrolled, type 2 diabetes, previously on oral antidiabetic regimen with metformin, and also weekly GLP-1 receptor agonist and basal insulin, with much improved control before last visit, when HbA1c returned 6.5%, improved.  At that time, sugars were mostly fluctuating within the target range.  She was not taking entire metformin dose due to GI symptoms (nausea, vomiting, diarrhea).  She was on units of Tresiba daily, increased from 14 units.  She was tolerating 2 mg of Ozempic well and she already lost 40 pounds on it. -She contacted me yesterday that she was having low blood sugars -down to the 80s, only occasionally 60s -currently off metformin for 3 weeks and off basal insulin, also. CGM interpretation: -At today's visit, we reviewed her CGM downloads: It appears that 96% of values are in target range (goal >70%), while 4% are higher than 180 (goal <25%), and 0% are lower than 70 (goal <4%).  The calculated average blood sugar is 135.  The projected HbA1c for the next 3 months (GMI) is 6.5%. -Reviewing the CGM trends, sugars are fluctuating within the target range, with only occasional slightly higher blood sugars after breakfast and dinner, but with the vast majority of her blood sugars at goal even after these meals.  There is only an occasional blood sugar close  to the lower limit of the target range, but not worrisome.  For now, especially since she has to decrease the dose of Ozempic to 1 mg weekly due to shortage at the pharmacy, I did not suggest any other changes.  If her sugars start increasing, we can always add metformin back or try to obtain Ozempic at the higher dose from another pharmacy. - I suggested to:  Patient Instructions  Please continue: - Ozempic 1 mg weekly  If the sugars increase, try to restart: - Metformin ER 500 mg 1x a day  Or, go back to:  - Ozempic 2 mg week  Please return in 4 months.  - advised to check sugars at different times of the day - 4x a day, rotating check times - advised for yearly eye exams >> she is UTD - return to clinic in 4 months  2. HL - reviewed latest lipid panel from 09/2021: LDL above our target of less than 70, triglycerides high, HDL low: Lab Results  Component Value Date   CHOL 141 09/21/2021   HDL 34.20 (L) 09/21/2021   LDLCALC 83 04/23/2018   LDLDIRECT 88.0 09/21/2021   TRIG 248.0 (H) 09/21/2021   CHOLHDL 4 09/21/2021  -She continues on Lipitor 20 mg daily without side effects  3.  Obesity class I -She was able to lose approximately 50 pounds after starting Ozempic -she lost approximately 8 pounds since last visit -We will back off the dose of Ozempic now due to shortage at the pharmacy, we may need to increase the dose to 2 mg during the holidays.  I advised her to let me know if she wants me to try to send it to another pharmacy.  Another option would be to inject the 1 mg dose twice a week.  Philemon Kingdom, MD PhD [facility] Endocrinology

## 2022-04-13 ENCOUNTER — Encounter (INDEPENDENT_AMBULATORY_CARE_PROVIDER_SITE_OTHER): Payer: 59 | Admitting: Internal Medicine

## 2022-04-13 DIAGNOSIS — E1165 Type 2 diabetes mellitus with hyperglycemia: Secondary | ICD-10-CM | POA: Diagnosis not present

## 2022-04-13 DIAGNOSIS — E1142 Type 2 diabetes mellitus with diabetic polyneuropathy: Secondary | ICD-10-CM | POA: Diagnosis not present

## 2022-04-14 ENCOUNTER — Other Ambulatory Visit: Payer: Self-pay

## 2022-04-14 NOTE — Telephone Encounter (Addendum)
  CGM interpretation: -At today's visit, we reviewed her CGM downloads: It appears that 93% of values are in target range (goal >70%), while 6% are higher than 180 (goal <25%), and<1% are lower than 70 (goal <4%).  The calculated average blood sugar is 139.  The projected HbA1c for the next 3 months (GMI) is 6.6%. -Reviewing the CGM trends, it appears that sugars are fluctuating goal within the target range but reviewing individual days, she has occasional higher blood sugars especially after dinner especially in the last few days. -She was not able to fill the prescription for Ozempic 2 mg weekly.  I advised her to try to double up on the 1 mg weekly but to try to get the 2 mg dose from another pharmacy.  Regarding allergy to the sensor, I recommended the steroid spray or a barier solution - see pt. Msg.  Please see the MyChart message reply(ies) for my assessment and plan.    This patient gave consent for this Medical Advice Message and is aware that it may result in a bill to [employer], as well as the possibility of receiving a bill for a co-payment or deductible. They are an established patient, but are not seeking medical advice exclusively about a problem treated during an in person or video visit in the last seven days. I did not recommend an in person or video visit within seven days of my reply.    I spent a total of 8 minutes cumulative time within 7 days through [employer].  Philemon Kingdom, MD PhD [facility] Endocrinology

## 2022-04-21 ENCOUNTER — Other Ambulatory Visit: Payer: Self-pay

## 2022-05-10 ENCOUNTER — Ambulatory Visit: Payer: 59

## 2022-05-17 ENCOUNTER — Ambulatory Visit: Payer: 59

## 2022-05-19 ENCOUNTER — Other Ambulatory Visit: Payer: Self-pay

## 2022-05-19 ENCOUNTER — Other Ambulatory Visit: Payer: Self-pay | Admitting: Psychiatry

## 2022-05-19 MED ORDER — QULIPTA 60 MG PO TABS
1.0000 | ORAL_TABLET | Freq: Every day | ORAL | 3 refills | Status: DC
  Filled 2022-05-19: qty 30, 30d supply, fill #0
  Filled 2022-06-16: qty 30, 30d supply, fill #1
  Filled 2022-08-02: qty 30, 30d supply, fill #2

## 2022-05-24 ENCOUNTER — Ambulatory Visit: Payer: 59

## 2022-05-26 ENCOUNTER — Telehealth: Payer: 59 | Admitting: Emergency Medicine

## 2022-05-26 ENCOUNTER — Other Ambulatory Visit: Payer: Self-pay

## 2022-05-26 ENCOUNTER — Telehealth: Payer: Self-pay | Admitting: Internal Medicine

## 2022-05-26 DIAGNOSIS — R21 Rash and other nonspecific skin eruption: Secondary | ICD-10-CM | POA: Diagnosis not present

## 2022-05-26 MED ORDER — TRIAMCINOLONE ACETONIDE 0.1 % EX CREA
1.0000 | TOPICAL_CREAM | Freq: Two times a day (BID) | CUTANEOUS | 0 refills | Status: DC
  Filled 2022-05-26: qty 30, 15d supply, fill #0

## 2022-05-26 NOTE — Telephone Encounter (Signed)
Pt called stating she has a rash on the center of her back for the last two days. Pt states the skin on her back and chest are burning. Sent to access nurse

## 2022-05-26 NOTE — Progress Notes (Signed)
E Visit for Rash  We are sorry that you are not feeling well. Here is how we plan to help!  Based on what you shared with me, you may have eczema or sensitive dry skin. I have prescribed a cream for you to use in place of hydrocortisone. Also, please use an eczema lotion, such as Aveeno, on your skin.    HOME CARE:  Take cool showers and avoid direct sunlight. Apply cool compress or wet dressings. Take a bath in an oatmeal bath.  Sprinkle content of one Aveeno packet under running faucet with comfortably warm water.  Bathe for 15-20 minutes, 1-2 times daily.  Pat dry with a towel. Do not rub the rash. Use hydrocortisone cream. Take an antihistamine like Benadryl for widespread rashes that itch.  The adult dose of Benadryl is 25-50 mg by mouth 4 times daily. Caution:  This type of medication may cause sleepiness.  Do not drink alcohol, drive, or operate dangerous machinery while taking antihistamines.  Do not take these medications if you have prostate enlargement.  Read package instructions thoroughly on all medications that you take.  GET HELP RIGHT AWAY IF:  Symptoms don't go away after treatment. Severe itching that persists. If you rash spreads or swells. If you rash begins to smell. If it blisters and opens or develops a yellow-brown crust. You develop a fever. You have a sore throat. You become short of breath.  MAKE SURE YOU:  Understand these instructions. Will watch your condition. Will get help right away if you are not doing well or get worse.  Thank you for choosing an e-visit.  Your e-visit answers were reviewed by a board certified advanced clinical practitioner to complete your personal care plan. Depending upon the condition, your plan could have included both over the counter or prescription medications.  Please review your pharmacy choice. Make sure the pharmacy is open so you can pick up prescription now. If there is a problem, you may contact your provider  through [employer] and have the prescription routed to another pharmacy.  Your safety is important to [place]. If you have drug allergies check your prescription carefully.   For the next 24 hours you can use MyChart to ask questions about today's visit, request a non-urgent call back, or ask for a work or school excuse. You will get an email in the next two days asking about your experience. I hope that your e-visit has been valuable and will speed your recovery.  I have spent 5 minutes in review of e-visit questionnaire, review and updating patient chart, medical decision making and response to patient.   Willeen Cass, PhD, FNP-BC

## 2022-05-26 NOTE — Telephone Encounter (Signed)
Spoke with pt and was able to get her scheduled tomorrow morning at the [facility] office at [place].

## 2022-05-27 ENCOUNTER — Ambulatory Visit: Payer: 59 | Admitting: Family Medicine

## 2022-05-27 ENCOUNTER — Encounter: Payer: Self-pay | Admitting: Family Medicine

## 2022-05-27 ENCOUNTER — Other Ambulatory Visit (HOSPITAL_COMMUNITY): Payer: Self-pay

## 2022-05-27 VITALS — BP 112/72 | HR 97 | Temp 97.1°F | Ht 66.0 in | Wt 186.3 lb

## 2022-05-27 DIAGNOSIS — B029 Zoster without complications: Secondary | ICD-10-CM | POA: Diagnosis not present

## 2022-05-27 MED ORDER — GABAPENTIN 300 MG PO CAPS
300.0000 mg | ORAL_CAPSULE | Freq: Three times a day (TID) | ORAL | 1 refills | Status: DC
  Filled 2022-05-27: qty 30, 10d supply, fill #0
  Filled 2022-10-20: qty 30, 10d supply, fill #1

## 2022-05-27 MED ORDER — VALACYCLOVIR HCL 1 G PO TABS
1000.0000 mg | ORAL_TABLET | Freq: Three times a day (TID) | ORAL | 0 refills | Status: AC
  Filled 2022-05-27: qty 20, 7d supply, fill #0

## 2022-05-27 NOTE — Progress Notes (Signed)
Established Patient Office Visit   Subjective:  Patient ID: [name], female    DOB: 1970-07-28  Age: 51 y.o. MRN: [id number]  Chief Complaint  Patient presents with   Rash    Rash on back very painful x 3 days    Rash   Encounter Diagnoses  Name Primary?   Herpes zoster without complication Yes   Presents with a 3 to 4-day history of a burning and pruritic rash in her upper left back and above her left breast.  It mostly burns and stings.  It hurts for her sure to touch it.  She is a full-time Therapist, sports, she is working on her PhD and is a single parent.  She has not had the Shingrix vaccine.  She is taken gabapentin in the past without issue.   Review of Systems  Constitutional: Negative.   HENT: Negative.    Eyes:  Negative for blurred vision, discharge and redness.  Respiratory: Negative.    Cardiovascular: Negative.   Gastrointestinal:  Negative for abdominal pain.  Genitourinary: Negative.   Musculoskeletal: Negative.  Negative for myalgias.  Skin:  Positive for itching and rash.  Neurological:  Negative for tingling, loss of consciousness and weakness.  Endo/Heme/Allergies:  Negative for polydipsia.     Current Outpatient Medications:    albuterol (VENTOLIN HFA) 108 (90 Base) MCG/ACT inhaler, Inhale 2 puffs into the lungs every 6 (six) hours as needed for wheezing or shortness of breath., Disp: 18 g, Rfl: 0   aspirin EC 81 MG tablet, Take 1 tablet (81 mg total) by mouth daily., Disp: 30 tablet, Rfl: 1   Atogepant (QULIPTA) 60 MG TABS, Take 1 tablet by mouth daily., Disp: 30 tablet, Rfl: 3   atorvastatin (LIPITOR) 20 MG tablet, Take 1 tablet (20 mg total) by mouth daily., Disp: 90 tablet, Rfl: 3   baclofen (LIORESAL) 10 MG tablet, Take 1 tablet (10 mg total) by mouth 2 (two) times daily as needed for muscle spasms. baclofen 10 mg tablet Strength: 10 mg, Disp: 60 tablet, Rfl: 3   Blood Glucose Monitoring Suppl (FREESTYLE LITE) DEVI, 1 each by Does not apply route 2  (two) times daily. E11.9, Disp: 1 each, Rfl: 0   Blood Pressure Monitoring (OMRON 3 SERIES BP MONITOR) DEVI, Use as directed, Disp: 1 each, Rfl: 0   Continuous Blood Gluc Receiver (DEXCOM G6 RECEIVER) DEVI, Use as directed., Disp: 1 each, Rfl: 1   Continuous Blood Gluc Sensor (DEXCOM G7 SENSOR) MISC, 3 each by Does not apply route every 30 (thirty) days. Apply 1 sensor every 10 days, Disp: 9 each, Rfl: 4   Continuous Blood Gluc Transmit (DEXCOM G6 TRANSMITTER) MISC, Use as directed every 90 days., Disp: 1 each, Rfl: 1   dicyclomine (BENTYL) 20 MG tablet, Take 1 tablet (20 mg total) by mouth every 6 (six) hours., Disp: 90 tablet, Rfl: 1   EPINEPHrine 0.3 mg/0.3 mL IJ SOAJ injection, Inject 0.3 mg into the muscle as needed for anaphylaxis., Disp: 1 each, Rfl: 0   ergocalciferol (DRISDOL) 1.25 MG (50000 UT) capsule, Take 1 capsule (50,000 Units total) by mouth once a week., Disp: 4 capsule, Rfl: 2   gabapentin (NEURONTIN) 300 MG capsule, Take 1 capsule (300 mg total) by mouth 3 (three) times daily. Has taken before without issue., Disp: 30 capsule, Rfl: 1   glucose blood (FREESTYLE LITE) test strip, 1 each by Other route 2 (two) times daily. E11.9, Disp: 200 each, Rfl: 0   insulin degludec (TRESIBA  FLEXTOUCH) 200 UNIT/ML FlexTouch Pen, Inject 20 Units into the skin daily., Disp: 9 mL, Rfl: 3   Insulin Pen Needle (UNIFINE PENTIPS) 32G X 4 MM MISC, Use 1-2x a day, Disp: 200 each, Rfl: 4   Ipratropium-Albuterol (COMBIVENT) 20-100 MCG/ACT AERS respimat, Inhale 1 puff into the lungs every 6 (six) hours., Disp: 4 g, Rfl: 11   Lancets (FREESTYLE) lancets, 1 each by Other route 2 (two) times daily. E11.9, Disp: 200 each, Rfl: 0   losartan-hydrochlorothiazide (HYZAAR) 50-12.5 MG tablet, Take 1 tablet by mouth daily., Disp: 90 tablet, Rfl: 1   meloxicam (MOBIC) 15 MG tablet, Take 1 tablet (15 mg total) by mouth daily., Disp: 30 tablet, Rfl: 5   metFORMIN (GLUCOPHAGE-XR) 500 MG 24 hr tablet, Take 1 tablet (500 mg  total) by mouth daily with supper., Disp: 90 tablet, Rfl: 3   metoprolol succinate (TOPROL-XL) 25 MG 24 hr tablet, Take 0.5 tablets (12.5 mg total) by mouth daily., Disp: 45 tablet, Rfl: 3   ondansetron (ZOFRAN) 8 MG tablet, Take 1 tablet (8 mg total) by mouth every 8 (eight) hours as needed for nausea or vomiting., Disp: 20 tablet, Rfl: 0   ondansetron (ZOFRAN-ODT) 4 MG disintegrating tablet, Take 1 tablet (4 mg total) by mouth every 8 (eight) hours as needed for nausea or vomiting., Disp: 20 tablet, Rfl: 0   Semaglutide, 1 MG/DOSE, (OZEMPIC, 1 MG/DOSE,) 4 MG/3ML SOPN, Inject 1 mg into the skin once a week., Disp: 3 mL, Rfl: 5   Semaglutide, 2 MG/DOSE, (OZEMPIC, 2 MG/DOSE,) 8 MG/3ML SOPN, Inject 2 mg into the skin once a week., Disp: 9 mL, Rfl: 3   triamcinolone cream (KENALOG) 0.1 %, Apply 1 Application topically 2 (two) times daily., Disp: 30 g, Rfl: 0   Ubrogepant (UBRELVY) 100 MG TABS, Take 100 mg by mouth as needed. May repeat a dose in 2 hours if needed. Max dose 2 pills in 2 hours, Disp: 16 tablet, Rfl: 6   valACYclovir (VALTREX) 1000 MG tablet, Take 1 tablet (1,000 mg total) by mouth 3 (three) times daily for 7 days., Disp: 20 tablet, Rfl: 0   benzonatate (TESSALON) 100 MG capsule, Take 1-2 capsules (100-200 mg total) by mouth 3 (three) times daily as needed. (Patient not taking: Reported on 05/27/2022), Disp: 30 capsule, Rfl: 0   butalbital-acetaminophen-caffeine (FIORICET) 50-325-40 MG tablet, Take 1 tablet by mouth every 6 (six) hours as needed for headache. Do not refill in less than 30 day (Patient not taking: Reported on 02/22/2022), Disp: 30 tablet, Rfl: 1   insulin lispro (HUMALOG) 100 UNIT/ML KwikPen, Inject 22-24 Units into the skin 2 (two) times daily with a meal. (Patient not taking: Reported on 05/27/2022), Disp: 45 mL, Rfl: 3   pantoprazole (PROTONIX) 40 MG tablet, Take 1 tablet (40 mg total) by mouth daily. Take only with water and take 30 minutes before eating. (Patient not  taking: Reported on 05/27/2022), Disp: 90 tablet, Rfl: 3   Objective:     BP 112/72 (BP Location: Left Arm, Patient Position: Sitting, Cuff Size: Large)   Pulse 97   Temp (!) 97.1 F (36.2 C) (Temporal)   Ht '5\' 6"'$  (1.676 m)   Wt 186 lb 4.8 oz (84.5 kg)   SpO2 97%   BMI 30.07 kg/m    Physical Exam Constitutional:      General: She is not in acute distress.    Appearance: Normal appearance. She is not ill-appearing, toxic-appearing or diaphoretic.  HENT:     Head: Normocephalic  and atraumatic.     Right Ear: External ear normal.     Left Ear: External ear normal.  Eyes:     General: No scleral icterus.       Right eye: No discharge.        Left eye: No discharge.     Extraocular Movements: Extraocular movements intact.     Conjunctiva/sclera: Conjunctivae normal.  Pulmonary:     Effort: Pulmonary effort is normal. No respiratory distress.  Skin:    General: Skin is warm and dry.       Neurological:     Mental Status: She is alert and oriented to person, place, and time.  Psychiatric:        Mood and Affect: Mood normal.        Behavior: Behavior normal.      No results found for any visits on 05/27/22.    The 10-year ASCVD risk score (Arnett DK, et al., 2019) is: 7.3%    Assessment & Plan:   Herpes zoster without complication -     valACYclovir HCl; Take 1 tablet (1,000 mg total) by mouth 3 (three) times daily for 7 days.  Dispense: 20 tablet; Refill: 0 -     Gabapentin; Take 1 capsule (300 mg total) by mouth 3 (three) times daily. Has taken before without issue.  Dispense: 30 capsule; Refill: 1    Return Follow-up with Dr. Derrel Nip within the next few weeks..  Information was given on zoster, gabapentin and Valtrex.  Libby Maw, MD

## 2022-06-02 ENCOUNTER — Other Ambulatory Visit: Payer: Self-pay

## 2022-06-14 ENCOUNTER — Other Ambulatory Visit: Payer: Self-pay

## 2022-06-16 ENCOUNTER — Other Ambulatory Visit: Payer: Self-pay

## 2022-06-16 ENCOUNTER — Other Ambulatory Visit: Payer: Self-pay | Admitting: Internal Medicine

## 2022-06-16 DIAGNOSIS — E559 Vitamin D deficiency, unspecified: Secondary | ICD-10-CM

## 2022-06-16 NOTE — Telephone Encounter (Signed)
Refilled: 02/02/2022 Last OV: 09/21/2021 Next OV: not scheduled  Last Vit D lab: 09/21/2021

## 2022-06-17 ENCOUNTER — Other Ambulatory Visit: Payer: Self-pay

## 2022-06-20 ENCOUNTER — Other Ambulatory Visit: Payer: Self-pay

## 2022-06-20 MED FILL — Ergocalciferol Cap 1.25 MG (50000 Unit): ORAL | 28 days supply | Qty: 4 | Fill #0 | Status: AC

## 2022-06-20 NOTE — Telephone Encounter (Signed)
Refilled: 02/02/2022 Last OV: 09/21/2021 Next OV: not scheduled Last Vitamin d level: 09/21/2021

## 2022-06-23 ENCOUNTER — Ambulatory Visit
Admission: RE | Admit: 2022-06-23 | Discharge: 2022-06-23 | Disposition: A | Discharge status: Home / Self Care | Payer: Commercial Managed Care - PPO | Source: Ambulatory Visit | Attending: Internal Medicine | Admitting: Internal Medicine

## 2022-06-23 ENCOUNTER — Other Ambulatory Visit: Payer: Self-pay

## 2022-06-23 DIAGNOSIS — Z1231 Encounter for screening mammogram for malignant neoplasm of breast: Secondary | ICD-10-CM

## 2022-06-24 ENCOUNTER — Ambulatory Visit: Payer: 59

## 2022-06-27 ENCOUNTER — Ambulatory Visit: Payer: Commercial Managed Care - PPO | Admitting: Obstetrics and Gynecology

## 2022-06-29 ENCOUNTER — Other Ambulatory Visit: Payer: Self-pay

## 2022-06-30 ENCOUNTER — Ambulatory Visit: Payer: Commercial Managed Care - PPO | Admitting: Internal Medicine

## 2022-06-30 ENCOUNTER — Encounter: Payer: Self-pay | Admitting: Internal Medicine

## 2022-06-30 VITALS — BP 128/78 | HR 74 | Ht 66.0 in | Wt 185.0 lb

## 2022-06-30 DIAGNOSIS — E1169 Type 2 diabetes mellitus with other specified complication: Secondary | ICD-10-CM | POA: Diagnosis not present

## 2022-06-30 DIAGNOSIS — E785 Hyperlipidemia, unspecified: Secondary | ICD-10-CM

## 2022-06-30 DIAGNOSIS — E1142 Type 2 diabetes mellitus with diabetic polyneuropathy: Secondary | ICD-10-CM | POA: Diagnosis not present

## 2022-06-30 DIAGNOSIS — E1165 Type 2 diabetes mellitus with hyperglycemia: Secondary | ICD-10-CM | POA: Diagnosis not present

## 2022-06-30 LAB — POCT GLYCOSYLATED HEMOGLOBIN (HGB A1C): Hemoglobin A1C: 6.3 % — AB (ref 4.0–5.6)

## 2022-06-30 NOTE — Progress Notes (Signed)
Patient ID: [name], female   DOB: June 21, 1970, 52 y.o.   MRN: [id number]  HPI: [name] is a 52 y.o.-year-old female, returning for follow-up for DM2, dx in 2013, with history of GDM in 1990 and 2004, insulin-dependent since 2018, uncontrolled, with complications (mild CKD, PN).  She was previously seen by Dr. Loanne Drilling. Last visit with me 5 months ago.  Interim history: No increased urination, blurry vision, nausea, chest pain.   She lost 7 more lbs since last OV.  Reviewed HbA1c: Lab Results  Component Value Date   HGBA1C 6.5 (A) 01/28/2022   HGBA1C 8.1 (A) 10/19/2021   HGBA1C 7.5 (A) 08/03/2021   HGBA1C 7.6 (A) 05/03/2021   HGBA1C 7.0 (A) 01/26/2021   HGBA1C 6.9 (A) 10/26/2020   HGBA1C 6.1 (A) 07/28/2020   HGBA1C 6.0 (A) 01/30/2020   HGBA1C 6.6 (A) 12/02/2019   HGBA1C 7.3 (A) 08/15/2019   Previous on: - Ozempic 2 mg weekly - Humalog 22 -24 (30) units 2x a day after meals (patient has mostly 2 meals a day) She tried Victoza >> dysphagia. She tried Jardiance >> yeast infections. She was taking Metformin 11/2020 after he had IBS symptoms.  Now on: - Ozempic 2 >> 1 mg weekly -  N/V/D with higher doses >> stopped >> 500 mg with dinner - >> now off  Pt checks her sugars more than 4 times a day with her Dexcom G6 CGM:  Previously:  Previously:   Lowest sugar was 70s >> 74 >> 60s (felt it) >> 63 (sensor); she has hypoglycemia awareness at 70.  Highest sugar was 300s >> 201 (candy) >> 220s >> 200.  -+ Stage IIIa CKD, last BUN/creatinine:  Lab Results  Component Value Date   BUN 14 09/21/2021   BUN 13 03/15/2021   CREATININE 1.12 09/21/2021   CREATININE 1.09 (H) 03/15/2021  She is not on ACE inhibitor/ARB.  -+ HL; last set of lipids: Lab Results  Component Value Date   CHOL 141 09/21/2021   HDL 34.20 (L) 09/21/2021   LDLCALC 83 04/23/2018   LDLDIRECT 88.0 09/21/2021   TRIG 248.0 (H) 09/21/2021   CHOLHDL 4 09/21/2021  On Lipitor 20 mg daily.  -  last eye exam was in 08/2021. No DR reportedly.   - + numbness and tingling in her feet. Last foot exam: 09/2021.  On ASA 81.  Patient also has a history of HTN, fatty liver, IBS.  ROS: + see HPI  Past Medical History:  Diagnosis Date   Allergy    Anemia    Anxiety    claustrophobic   Asthma    Back pain    Biceps tendonosis of right shoulder    Cataract    Mixed OU   COVID-19    covid PNA hospitalized 2020, 06/06/21   Diabetes mellitus 2011   did not start metforfin, losing weight   Dyspnea    Fatty liver    Food allergy    Frozen shoulder    left, had steroid injection 10/05/21   Headache disorder    History of concussion    HLD (hyperlipidemia)    Hypertension    Hypertensive retinopathy    OU   IBS (irritable bowel syndrome)    Infertility, female    Joint pain    Lactose intolerance    Leg edema    Migraines    Palpitation    Post-menopausal    Seborrheic dermatitis    Shoulder impingement syndrome, right  Uveitis    [facility] in 2020 seen specialist [facility] last seen 11 or 05/2020   Vitamin D deficiency    Past Surgical History:  Procedure Laterality Date   ABDOMINAL HYSTERECTOMY  2006   heavy menses, endometriosis, l oophrectomy   BACK SURGERY     BREAST BIOPSY Right 2018   benign   BREAST EXCISIONAL BIOPSY Right 2003   benign   BREAST EXCISIONAL BIOPSY Right 1999   benign   BREAST SURGERY     right breast x 2 , benign   COLONOSCOPY  10/06/2020   HERNIA REPAIR  2003   left inguinal    LEFT OOPHORECTOMY     SHOULDER ARTHROSCOPY WITH SUBACROMIAL DECOMPRESSION AND OPEN ROTATOR C Right 07/14/2016   Procedure: RIGHT SHOULDER ARTHROSCOPY WITH SUBACROMIAL DECOMPRESSION, DISTAL CLAVICLE RESECTION AND MINI OPEN ROTATOR CUFF REPAIR, OPEN BICEP TENDODESIS;  Surgeon: Garald Balding, MD;  Location: [facility];  Service: Orthopedics;  Laterality: Right;   SHOULDER CLOSED REDUCTION Right 09/08/2016   Procedure: RIGHT CLOSED  MANIPULATION SHOULDER;  Surgeon: Garald Balding, MD;  Location: [place];  Service: Orthopedics;  Laterality: Right;   TUBAL LIGATION     UPPER GASTROINTESTINAL ENDOSCOPY  10/06/2020   Social History   Socioeconomic History   Marital status: Divorced    Spouse name: Not on file   Number of children: 2   Years of education: 14   Highest education level: Master's degree (e.g., MA, MS, MEng, MEd, MSW, MBA)  Occupational History   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   Right-handed.   1-3 cups caffeine weekly.   Social Determinants of Health   Financial Resource Strain: Not on file  Food Insecurity: Not on file  Transportation Needs: Not on file  Physical Activity: Not on file  Stress: Not on file  Social Connections: Not on file  Intimate Partner Violence: Not on file   Current Outpatient Medications on File Prior to Visit  Medication Sig Dispense Refill   albuterol (VENTOLIN HFA) 108 (90 Base) MCG/ACT inhaler Inhale 2 puffs into the lungs every 6 (six) hours as needed for wheezing or shortness of breath. 18 g 0   aspirin EC 81 MG tablet Take 1 tablet (81 mg total) by mouth daily. 30 tablet 1   Atogepant (QULIPTA) 60 MG TABS Take 1 tablet by mouth daily. 30 tablet 3   atorvastatin (LIPITOR) 20 MG tablet Take 1 tablet (20 mg total) by mouth daily. 90 tablet 3   baclofen (LIORESAL) 10 MG tablet Take 1 tablet (10 mg total) by mouth 2 (two) times daily as needed for muscle spasms. baclofen 10 mg tablet Strength: 10 mg 60 tablet 3   benzonatate (TESSALON) 100 MG capsule Take 1-2 capsules  (100-200 mg total) by mouth 3 (three) times daily as needed. (Patient not taking: Reported on 05/27/2022) 30 capsule 0   Blood Glucose Monitoring Suppl (FREESTYLE LITE) DEVI 1 each by Does not apply route 2 (two) times daily.  E11.9 1 each 0   Blood Pressure Monitoring (OMRON 3 SERIES BP MONITOR) DEVI Use as directed 1 each 0   butalbital-acetaminophen-caffeine (FIORICET) 50-325-40 MG tablet Take 1 tablet by mouth every 6 (six) hours as needed for headache. Do not refill in less than 30 day (Patient not taking: Reported on 02/22/2022) 30 tablet 1   Continuous Blood Gluc Receiver (DEXCOM G6 RECEIVER) DEVI Use as directed. 1 each 1   Continuous Blood Gluc Sensor (DEXCOM G7 SENSOR) MISC 3 each by Does not apply route every 30 (thirty) days. Apply 1 sensor every 10 days 9 each 4   Continuous Blood Gluc Transmit (DEXCOM G6 TRANSMITTER) MISC Use as directed every 90 days. 1 each 1   dicyclomine (BENTYL) 20 MG tablet Take 1 tablet (20 mg total) by mouth every 6 (six) hours. 90 tablet 1   EPINEPHrine 0.3 mg/0.3 mL IJ SOAJ injection Inject 0.3 mg into the muscle as needed for anaphylaxis. 1 each 0   gabapentin (NEURONTIN) 300 MG capsule Take 1 capsule (300 mg total) by mouth 3 (three) times daily. Has taken before without issue. 30 capsule 1   glucose blood (FREESTYLE LITE) test strip 1 each by Other route 2 (two) times daily. E11.9 200 each 0   insulin degludec (TRESIBA FLEXTOUCH) 200 UNIT/ML FlexTouch Pen Inject 20 Units into the skin daily. 9 mL 3   insulin lispro (HUMALOG) 100 UNIT/ML KwikPen Inject 22-24 Units into the skin 2 (two) times daily with a meal. (Patient not taking: Reported on 05/27/2022) 45 mL 3   Insulin Pen Needle (UNIFINE PENTIPS) 32G X 4 MM MISC Use 1-2x a day 200 each 4   Ipratropium-Albuterol (COMBIVENT) 20-100 MCG/ACT AERS respimat Inhale 1 puff into the lungs every 6 (six) hours. 4 g 11   Lancets (FREESTYLE) lancets 1 each by Other route 2 (two) times daily. E11.9 200 each 0    losartan-hydrochlorothiazide (HYZAAR) 50-12.5 MG tablet Take 1 tablet by mouth daily. 90 tablet 1   meloxicam (MOBIC) 15 MG tablet Take 1 tablet (15 mg total) by mouth daily. 30 tablet 5   metFORMIN (GLUCOPHAGE-XR) 500 MG 24 hr tablet Take 1 tablet (500 mg total) by mouth daily with supper. 90 tablet 3   metoprolol succinate (TOPROL-XL) 25 MG 24 hr tablet Take 0.5 tablets (12.5 mg total) by mouth daily. 45 tablet 3   ondansetron (ZOFRAN) 8 MG tablet Take 1 tablet (8 mg total) by mouth every 8 (eight) hours as needed for nausea or vomiting. 20 tablet 0   ondansetron (ZOFRAN-ODT) 4 MG disintegrating tablet Take 1 tablet (4 mg total) by mouth every 8 (eight) hours as needed for nausea or vomiting. 20 tablet 0   pantoprazole (PROTONIX) 40 MG tablet Take 1 tablet (40 mg total) by mouth daily. Take only with water and take 30 minutes before eating. (Patient not taking: Reported on 05/27/2022) 90 tablet 3   Semaglutide, 1 MG/DOSE, (OZEMPIC, 1 MG/DOSE,) 4 MG/3ML SOPN Inject 1 mg into the skin once a week. 3 mL 5   Semaglutide, 2 MG/DOSE, (OZEMPIC, 2 MG/DOSE,) 8 MG/3ML SOPN Inject 2 mg into the skin once a week. 9 mL 3   triamcinolone cream (KENALOG) 0.1 % Apply 1 Application topically 2 (two) times daily. 30 g 0   Ubrogepant (UBRELVY) 100 MG TABS Take 100 mg by mouth as needed. May repeat a dose in 2 hours if needed. Max dose 2 pills in 2 hours 16 tablet 6   Vitamin D, Ergocalciferol, (DRISDOL) 1.25  MG (50000 UNIT) CAPS capsule Take 1 capsule (50,000 Units total) by mouth once a week. 4 capsule 2   No current facility-administered medications on file prior to visit.   Allergies  Allergen Reactions   Bamlanivimab Anaphylaxis, Itching, Palpitations, Rash, Shortness Of Breath and Swelling   Botox [Onabotulinumtoxina] Shortness Of Breath    syncope   Clindamycin/Lincomycin Other (See Comments)   Kiwi Extract Shortness Of Breath   Maxalt [Rizatriptan Benzoate] Anaphylaxis    Chest pain   Nitrous Oxide  Shortness Of Breath    syncope   Rizatriptan Benzoate Anaphylaxis    Chest pain   Triptans Shortness Of Breath   Vyepti [Eptinezumab-Jjmr] Shortness Of Breath, Palpitations and Other (See Comments)    Throat soreness, tachycardia   Aspirin Nausea And Vomiting    Mouth blisters   Contrast Media [Iodinated Contrast Media]    Erythromycin    Flagyl [Metronidazole]    Haemophilus Influenzae Vaccines    Latex     Sometimes causes rash   Mango Flavor    Vicodin [Hydrocodone-Acetaminophen]     hallucinations   Influenza Virus Vaccine Palpitations   Penicillins Nausea And Vomiting, Rash and Other (See Comments)    Did it involve swelling of the face/tongue/throat, SOB, or low BP? No Did it involve sudden or severe rash/hives, skin peeling, or any reaction on the inside of your mouth or nose? No Did you need to seek medical attention at a hospital or doctor's office? Yes When did it last happen?     patient was 52 years old  If all above answers are "NO", may proceed with cephalosporin use.   Tetracyclines & Related Nausea And Vomiting and Rash   Family History  Problem Relation Age of Onset   Diabetes Mother    Heart disease Mother 80   Hypertension Mother    Hyperlipidemia Mother    Heart failure Mother    Stroke Mother    Thyroid disease Mother    Depression Mother    Sleep apnea Mother    Obesity Mother    Colon polyps Mother    Cancer Father 69       Lung Cancer   Heart disease Father    Mental illness Sister        bipolar, substance abuse,  clean 2 yrs   Hypertension Sister    Cancer Paternal Grandmother 12       breast cancer   Breast cancer Paternal Grandmother    Diabetes Maternal Grandmother    Hypertension Maternal Grandmother    Diabetes Son    Colon cancer Neg Hx    Esophageal cancer Neg Hx    Rectal cancer Neg Hx    Stomach cancer Neg Hx    PE: BP 128/78 (BP Location: Left Arm, Patient Position: Sitting, Cuff Size: Normal)   Pulse 74   Ht '5\' 6"'$  (1.676  m)   Wt 185 lb (83.9 kg)   SpO2 99%   BMI 29.86 kg/m  Wt Readings from Last 3 Encounters:  06/30/22 185 lb (83.9 kg)  05/27/22 186 lb 4.8 oz (84.5 kg)  04/05/22 192 lb 3.2 oz (87.2 kg)   Constitutional: overweight, in NAD Eyes: no exophthalmos ENT: no masses palpated in neck, no cervical lymphadenopathy Cardiovascular: RRR, No MRG Respiratory: CTA B Musculoskeletal: no deformities Skin: no rashes Neurological: no tremor with outstretched hands  ASSESSMENT: 1. DM2, insulin-dependent, uncontrolled, with complications - CKD - mild - PN  2. HL  3.  Obesity class  I  PLAN:  1. Patient with longstanding, uncontrolled, type 2 diabetes, previously on oral antidiabetic regimen with metformin, basal insulin, and GLP-1 receptor agonist, but with much improved control after starting Ozempic.  She did have GI symptoms (nausea, vomiting, diarrhea) with the highest dose of metformin but then was able to tolerate the lower dose better.  However, she was able to come off [place] and metformin while on the higher dose of Ozempic, 2 mg weekly and especially after losing 40 pounds.  At last visit, she was having some lows-occasionally to the 60s.  Also, she was not able to find the higher dose of Ozempic so we discussed about using the lower dose and possibly complementing with metformin until she can get the 2 mg dose.  HbA1c at last visit was 6.5%, lower. CGM interpretation: -At today's visit, we reviewed her CGM downloads: It appears that 99% of values are in target range (goal >70%), while 1% are higher than 180 (goal <25%), and 0% are lower than 70 (goal <4%).  The calculated average blood sugar is 128.  The projected HbA1c for the next 3 months (GMI) is 6.4%. -Reviewing the CGM trends, sugars appear fluctuating but only within the target range, overall excellent.  No need to change her regimen for now.  She is currently on the higher dose of Ozempic and also 1 tab of metformin daily,, which we can  continue for now. - I suggested to:  Patient Instructions  Please continue: - Ozempic 2 mg weekly  Please return in 6 months.  - we checked her HbA1c: 6.3% (lower) - advised to check sugars at different times of the day - 4x a day, rotating check times - advised for yearly eye exams >> she is UTD - return to clinic in 6 months  2. HL -Reviewed latest lipid panel from 09/2021: LDL above our target of less than 70, triglycerides high, HDL low: Lab Results  Component Value Date   CHOL 141 09/21/2021   HDL 34.20 (L) 09/21/2021   LDLCALC 83 04/23/2018   LDLDIRECT 88.0 09/21/2021   TRIG 248.0 (H) 09/21/2021   CHOLHDL 4 09/21/2021  -She continues on Lipitor 20 mg daily without side effects  3.  Obesity class I -She was able to lose approximately 50 pounds after starting Ozempic  -She lost 8 pounds before last visit -At last visit we had to back off the Ozempic dose due to the shortage at the pharmacy but I did advise her to try to increase it from 1 mg to 2 mg whenever possible. -lost 7 more lbs since last OV  Philemon Kingdom, MD PhD [facility]

## 2022-06-30 NOTE — Patient Instructions (Addendum)
Please continue: - Metformin ER 500 mg with dinner - Ozempic 2 mg weekly  Please return in 6 months.

## 2022-07-01 ENCOUNTER — Encounter: Payer: Self-pay | Admitting: Internal Medicine

## 2022-07-01 ENCOUNTER — Ambulatory Visit (INDEPENDENT_AMBULATORY_CARE_PROVIDER_SITE_OTHER): Payer: Commercial Managed Care - PPO | Admitting: Internal Medicine

## 2022-07-01 ENCOUNTER — Other Ambulatory Visit: Payer: Self-pay

## 2022-07-01 VITALS — BP 120/78 | HR 81 | Temp 98.2°F | Resp 16 | Ht 66.0 in | Wt 183.0 lb

## 2022-07-01 DIAGNOSIS — R809 Proteinuria, unspecified: Secondary | ICD-10-CM

## 2022-07-01 DIAGNOSIS — E1169 Type 2 diabetes mellitus with other specified complication: Secondary | ICD-10-CM

## 2022-07-01 DIAGNOSIS — I471 Supraventricular tachycardia, unspecified: Secondary | ICD-10-CM

## 2022-07-01 DIAGNOSIS — I10 Essential (primary) hypertension: Secondary | ICD-10-CM | POA: Diagnosis not present

## 2022-07-01 DIAGNOSIS — E1165 Type 2 diabetes mellitus with hyperglycemia: Secondary | ICD-10-CM

## 2022-07-01 DIAGNOSIS — E1129 Type 2 diabetes mellitus with other diabetic kidney complication: Secondary | ICD-10-CM | POA: Diagnosis not present

## 2022-07-01 DIAGNOSIS — E785 Hyperlipidemia, unspecified: Secondary | ICD-10-CM

## 2022-07-01 DIAGNOSIS — K760 Fatty (change of) liver, not elsewhere classified: Secondary | ICD-10-CM | POA: Diagnosis not present

## 2022-07-01 DIAGNOSIS — E1142 Type 2 diabetes mellitus with diabetic polyneuropathy: Secondary | ICD-10-CM | POA: Diagnosis not present

## 2022-07-01 DIAGNOSIS — R42 Dizziness and giddiness: Secondary | ICD-10-CM

## 2022-07-01 MED ORDER — EPINEPHRINE 0.3 MG/0.3ML IJ SOAJ
0.3000 mg | INTRAMUSCULAR | 0 refills | Status: DC | PRN
  Filled 2022-07-01: qty 2, 30d supply, fill #0

## 2022-07-01 MED ORDER — PREDNISONE 10 MG PO TABS
ORAL_TABLET | ORAL | 0 refills | Status: AC
  Filled 2022-07-01: qty 21, 6d supply, fill #0

## 2022-07-01 MED ORDER — DIAZEPAM 5 MG PO TABS
5.0000 mg | ORAL_TABLET | Freq: Two times a day (BID) | ORAL | 0 refills | Status: DC | PRN
  Filled 2022-07-01: qty 30, 15d supply, fill #0

## 2022-07-01 NOTE — Patient Instructions (Signed)
Take the prednisone taper ,  you can use the diazepam instead of Antivert if it   If no improvement after the predisone, let me know and I'll make the ENT referral for the Dix hallpike maneuver (to shake up the crystals

## 2022-07-01 NOTE — Progress Notes (Unsigned)
Subjective:  Patient ID: [name], female    DOB: [birth year]  Age: 52 y.o. MRN: [id number]  CC: The primary encounter diagnosis was Fatty infiltration of liver. Diagnoses of Hyperlipidemia associated with type 2 diabetes mellitus ([place]), Vertigo, Poorly controlled type 2 diabetes mellitus with peripheral neuropathy ([place]), Microalbuminuria due to type 2 diabetes mellitus ([place]), Essential hypertension, and PSVT (paroxysmal supraventricular tachycardia) were also pertinent to this visit.   HPI [name] presents for follow up on multiple issues.  Chief Complaint  Patient presents with   Herpes Zoster    Follow up    Hypotension   Dizziness    Over the last week    1) Chronic migraines HEADACHES IMPROVED with use of QLIPTA   2) obesity:  significant weight loss via appeitite suppression with GLP 1 agonist.  Not drinking enough water due to diminished appetite  3) Recent onset of VERTIGO WITH POSITION CHANGE FOR THE LAST 10 DAYS.  NO TINNITUs, headache, vision changes or sinus congestion.  .  Occasional nausea.  Treied meclinzien m ade her sleepy   4) RECOVERED FROM SHINGLES 2-3 WEEKS AGO IN THE left  T3 DISTRIBUTION     Outpatient Medications Prior to Visit  Medication Sig Dispense Refill   albuterol (VENTOLIN HFA) 108 (90 Base) MCG/ACT inhaler Inhale 2 puffs into the lungs every 6 (six) hours as needed for wheezing or shortness of breath. 18 g 0   aspirin EC 81 MG tablet Take 1 tablet (81 mg total) by mouth daily. 30 tablet 1   Atogepant (QULIPTA) 60 MG TABS Take 1 tablet by mouth daily. 30 tablet 3   atorvastatin (LIPITOR) 20 MG tablet Take 1 tablet (20 mg total) by mouth daily. 90 tablet 3   baclofen (LIORESAL) 10 MG tablet Take 1 tablet (10 mg total) by mouth 2 (two) times daily as needed for muscle spasms. baclofen 10 mg tablet Strength: 10 mg 60 tablet 3   Blood Glucose Monitoring Suppl (FREESTYLE LITE) DEVI 1 each by Does not apply route 2 (two) times daily. E11.9  1 each 0   Blood Pressure Monitoring (OMRON 3 SERIES BP MONITOR) DEVI Use as directed 1 each 0   butalbital-acetaminophen-caffeine (FIORICET) 50-325-40 MG tablet Take 1 tablet by mouth every 6 (six) hours as needed for headache. Do not refill in less than 30 day 30 tablet 1   Continuous Blood Gluc Sensor (DEXCOM G7 SENSOR) MISC 3 each by Does not apply route every 30 (thirty) days. Apply 1 sensor every 10 days 9 each 4   dicyclomine (BENTYL) 20 MG tablet Take 1 tablet (20 mg total) by mouth every 6 (six) hours. 90 tablet 1   gabapentin (NEURONTIN) 300 MG capsule Take 1 capsule (300 mg total) by mouth 3 (three) times daily. Has taken before without issue. 30 capsule 1   glucose blood (FREESTYLE LITE) test strip 1 each by Other route 2 (two) times daily. E11.9 200 each 0   Insulin Pen Needle (UNIFINE PENTIPS) 32G X 4 MM MISC Use 1-2x a day 200 each 4   Ipratropium-Albuterol (COMBIVENT) 20-100 MCG/ACT AERS respimat Inhale 1 puff into the lungs every 6 (six) hours. 4 g 11   Lancets (FREESTYLE) lancets 1 each by Other route 2 (two) times daily. E11.9 200 each 0   losartan-hydrochlorothiazide (HYZAAR) 50-12.5 MG tablet Take 1 tablet by mouth daily. 90 tablet 1   meloxicam (MOBIC) 15 MG tablet Take 1 tablet (15 mg total) by mouth daily. 30 tablet  5   metFORMIN (GLUCOPHAGE-XR) 500 MG 24 hr tablet Take 1 tablet (500 mg total) by mouth daily with supper. 90 tablet 3   metoprolol succinate (TOPROL-XL) 25 MG 24 hr tablet Take 0.5 tablets (12.5 mg total) by mouth daily. 45 tablet 3   ondansetron (ZOFRAN) 8 MG tablet Take 1 tablet (8 mg total) by mouth every 8 (eight) hours as needed for nausea or vomiting. 20 tablet 0   ondansetron (ZOFRAN-ODT) 4 MG disintegrating tablet Take 1 tablet (4 mg total) by mouth every 8 (eight) hours as needed for nausea or vomiting. 20 tablet 0   Semaglutide, 2 MG/DOSE, (OZEMPIC, 2 MG/DOSE,) 8 MG/3ML SOPN Inject 2 mg into the skin once a week. 9 mL 3   triamcinolone cream (KENALOG)  0.1 % Apply 1 Application topically 2 (two) times daily. 30 g 0   Ubrogepant (UBRELVY) 100 MG TABS Take 100 mg by mouth as needed. May repeat a dose in 2 hours if needed. Max dose 2 pills in 2 hours 16 tablet 6   Vitamin D, Ergocalciferol, (DRISDOL) 1.25 MG (50000 UNIT) CAPS capsule Take 1 capsule (50,000 Units total) by mouth once a week. 4 capsule 2   EPINEPHrine 0.3 mg/0.3 mL IJ SOAJ injection Inject 0.3 mg into the muscle as needed for anaphylaxis. 1 each 0   benzonatate (TESSALON) 100 MG capsule Take 1-2 capsules (100-200 mg total) by mouth 3 (three) times daily as needed. (Patient not taking: Reported on 05/27/2022) 30 capsule 0   pantoprazole (PROTONIX) 40 MG tablet Take 1 tablet (40 mg total) by mouth daily. Take only with water and take 30 minutes before eating. (Patient not taking: Reported on 07/01/2022) 90 tablet 3   No facility-administered medications prior to visit.    Review of Systems;  Patient denies headache, fevers, malaise, unintentional weight loss, skin rash, eye pain, sinus congestion and sinus pain, sore throat, dysphagia,  hemoptysis , cough, dyspnea, wheezing, chest pain, palpitations, orthopnea, edema, abdominal pain, nausea, melena, diarrhea, constipation, flank pain, dysuria, hematuria, urinary  Frequency, nocturia, numbness, tingling, seizures,  Focal weakness, Loss of consciousness,  Tremor, insomnia, depression, anxiety, and suicidal ideation.      Objective:  BP 120/78   Pulse 81   Temp 98.2 F (36.8 C)   Resp 16   Ht '5\' 6"'$  (1.676 m)   Wt 183 lb (83 kg)   SpO2 95%   BMI 29.54 kg/m   BP Readings from Last 3 Encounters:  07/01/22 120/78  06/30/22 128/78  05/27/22 112/72    Wt Readings from Last 3 Encounters:  07/01/22 183 lb (83 kg)  06/30/22 185 lb (83.9 kg)  05/27/22 186 lb 4.8 oz (84.5 kg)    Physical Exam Vitals reviewed.  Constitutional:      General: She is not in acute distress.    Appearance: Normal appearance. She is normal weight.  She is not ill-appearing, toxic-appearing or diaphoretic.  HENT:     Head: Normocephalic.  Eyes:     General: No visual field deficit or scleral icterus.       Right eye: No discharge.        Left eye: No discharge.     Extraocular Movements: Extraocular movements intact.     Conjunctiva/sclera: Conjunctivae normal.     Pupils: Pupils are equal, round, and reactive to light.  Cardiovascular:     Rate and Rhythm: Normal rate and regular rhythm.     Heart sounds: Normal heart sounds.  Pulmonary:  Effort: Pulmonary effort is normal. No respiratory distress.     Breath sounds: Normal breath sounds.  Musculoskeletal:        General: Normal range of motion.  Skin:    General: Skin is warm and dry.  Neurological:     General: No focal deficit present.     Mental Status: She is alert and oriented to person, place, and time. Mental status is at baseline.     Cranial Nerves: Cranial nerves 2-12 are intact. No cranial nerve deficit, dysarthria or facial asymmetry.     Sensory: Sensation is intact.     Deep Tendon Reflexes:     Reflex Scores:      Patellar reflexes are 2+ on the left side.    Comments: 2 beats of nystagmus with lateral gaze   Psychiatric:        Mood and Affect: Mood normal.        Behavior: Behavior normal.        Thought Content: Thought content normal.        Judgment: Judgment normal.     Lab Results  Component Value Date   HGBA1C 6.3 (A) 06/30/2022   HGBA1C 6.5 (A) 01/28/2022   HGBA1C 8.1 (A) 10/19/2021    Lab Results  Component Value Date   CREATININE 0.69 07/01/2022   CREATININE 1.12 09/21/2021   CREATININE 1.09 (H) 03/15/2021    Lab Results  Component Value Date   WBC 6.6 07/01/2022   HGB 12.9 07/01/2022   HCT 37.5 07/01/2022   PLT 203 07/01/2022   GLUCOSE 94 07/01/2022   CHOL 125 07/01/2022   TRIG 95 07/01/2022   HDL 34 (L) 07/01/2022   LDLDIRECT 88.0 09/21/2021   LDLCALC 73 07/01/2022   ALT 10 07/01/2022   AST 14 07/01/2022   NA 143  07/01/2022   K 3.8 07/01/2022   CL 105 07/01/2022   CREATININE 0.69 07/01/2022   BUN 10 07/01/2022   CO2 29 07/01/2022   TSH 0.48 09/21/2021   HGBA1C 6.3 (A) 06/30/2022   MICROALBUR 2.5 (H) 10/23/2018    MM 3D SCREEN BREAST BILATERAL  Result Date: 06/24/2022 CLINICAL DATA:  Screening. EXAM: DIGITAL SCREENING BILATERAL MAMMOGRAM WITH TOMOSYNTHESIS AND CAD TECHNIQUE: Bilateral screening digital craniocaudal and mediolateral oblique mammograms were obtained. Bilateral screening digital breast tomosynthesis was performed. The images were evaluated with computer-aided detection. COMPARISON:  Previous exam(s). ACR Breast Density Category b: There are scattered areas of fibroglandular density. FINDINGS: There are no findings suspicious for malignancy. IMPRESSION: No mammographic evidence of malignancy. A result letter of this screening mammogram will be mailed directly to the patient. RECOMMENDATION: Screening mammogram in one year. (Code:SM-B-01Y) BI-RADS CATEGORY  1: Negative. Electronically Signed   By: Franki Cabot M.D.   On: 06/24/2022 16:01    Assessment & Plan:  .Fatty infiltration of liver Assessment & Plan: Suggested by ultrasound. Continue statin , GLP 1 agonist and weight loss  Lab Results  Component Value Date   ALT 10 07/01/2022   AST 14 07/01/2022   ALKPHOS 132 (H) 09/21/2021   BILITOT 0.5 07/01/2022     Orders: -     COMPLETE METABOLIC PANEL WITH GFR  Hyperlipidemia associated with type 2 diabetes mellitus ([place]) Assessment & Plan: Currently well-controlled on current medications .  hemoglobin A1c is at goal of less than 7.0 . Patient is reminded to schedule an annual eye exam and foot exam is normal today. Patient has treated  microalbuminuria. Patient is tolerating statin therapy for  CAD risk reduction and on ACE/ARB for renal protection and hypertension    Lab Results  Component Value Date   HGBA1C 6.3 (A) 06/30/2022   Lab Results  Component Value Date    CREATININE 0.69 07/01/2022   Lab Results  Component Value Date   CHOL 125 07/01/2022   HDL 34 (L) 07/01/2022   LDLCALC 73 07/01/2022   LDLDIRECT 88.0 09/21/2021   TRIG 95 07/01/2022   CHOLHDL 3.7 07/01/2022     Orders: -     Lipid Panel w/reflex Direct LDL  Vertigo Assessment & Plan: Positional, new onset.  Neuro exam normal except for 2-3 beats of nystagmus with lateral gaze. Increase fluid intake, start prednisone taper.  Refer to ENT if persistent   Orders: -     CBC with Differential/Platelet -     B12 and Folate Panel  Poorly controlled type 2 diabetes mellitus with peripheral neuropathy ([place]) Assessment & Plan: Now well controlled with signigicant weight loss using Ozempic.  Continue losartan,  statin   Lab Results  Component Value Date   HGBA1C 6.3 (A) 06/30/2022   Lab Results  Component Value Date   LABMICR 24.7 07/06/2017   MICROALBUR 2.5 (H) 10/23/2018   MICROALBUR 11.5 (H) 11/20/2017   Lab Results  Component Value Date   CREATININE 0.69 07/01/2022       Orders: -     Microalbumin / creatinine urine ratio; Future  Microalbuminuria due to type 2 diabetes mellitus ([place]) Assessment & Plan: Managed with losartan. BP is at goal   +l   Essential hypertension Assessment & Plan: Well controlled on current regimen of losartan hct  and metoprolol Renal function stable, no changes today.    PSVT (paroxysmal supraventricular tachycardia) Assessment & Plan: Suspected by history odf symptomatic palpitations.  Continue metoprolol   12.5 mg daily    Other orders -     EPINEPHrine; Inject 0.3 mg into the muscle as needed for anaphylaxis.  Dispense: 2 each; Refill: 0 -     predniSONE; Take 6 tablets (60 mg total) by mouth daily for 1 day, THEN 5 tablets (50 mg total) daily for 1 day, THEN 4 tablets (40 mg total) daily for 1 day, THEN 3 tablets (30 mg total) daily for 1 day, THEN 2 tablets (20 mg total) daily for 1 day, THEN 1 tablet (10 mg total) daily for  1 day.  Dispense: 21 tablet; Refill: 0 -     diazePAM; Take 1 tablet (5 mg total) by mouth every 12 (twelve) hours as needed for anxiety.  Dispense: 30 tablet; Refill: 0   Follow-up: Return in about 6 months (around 12/30/2022).   Crecencio Mc, MD

## 2022-07-02 LAB — COMPLETE METABOLIC PANEL WITH GFR
AG Ratio: 1.8 (calc) (ref 1.0–2.5)
ALT: 10 U/L (ref 6–29)
AST: 14 U/L (ref 10–35)
Albumin: 4.4 g/dL (ref 3.6–5.1)
Alkaline phosphatase (APISO): 112 U/L (ref 37–153)
BUN: 10 mg/dL (ref 7–25)
CO2: 29 mmol/L (ref 20–32)
Calcium: 9.5 mg/dL (ref 8.6–10.4)
Chloride: 105 mmol/L (ref 98–110)
Creat: 0.69 mg/dL (ref 0.50–1.03)
Globulin: 2.5 g/dL (calc) (ref 1.9–3.7)
Glucose, Bld: 94 mg/dL (ref 65–99)
Potassium: 3.8 mmol/L (ref 3.5–5.3)
Sodium: 143 mmol/L (ref 135–146)
Total Bilirubin: 0.5 mg/dL (ref 0.2–1.2)
Total Protein: 6.9 g/dL (ref 6.1–8.1)
eGFR: 104 mL/min/{1.73_m2} (ref >=60)

## 2022-07-02 LAB — CBC WITH DIFFERENTIAL/PLATELET
Absolute Monocytes: 337 cells/uL (ref 200–950)
Basophils Absolute: 20 cells/uL (ref 0–200)
Basophils Relative: 0.3 %
Eosinophils Absolute: 92 cells/uL (ref 15–500)
Eosinophils Relative: 1.4 %
HCT: 37.5 % (ref 35.0–45.0)
Hemoglobin: 12.9 g/dL (ref 11.7–15.5)
Lymphs Abs: 3188 cells/uL (ref 850–3900)
MCH: 29.7 pg (ref 27.0–33.0)
MCHC: 34.4 g/dL (ref 32.0–36.0)
MCV: 86.2 fL (ref 80.0–100.0)
MPV: 10.8 fL (ref 7.5–12.5)
Monocytes Relative: 5.1 %
Neutro Abs: 2963 cells/uL (ref 1500–7800)
Neutrophils Relative %: 44.9 %
Platelets: 203 10*3/uL (ref 140–400)
RBC: 4.35 10*6/uL (ref 3.80–5.10)
RDW: 12.5 % (ref 11.0–15.0)
Total Lymphocyte: 48.3 %
WBC: 6.6 10*3/uL (ref 3.8–10.8)

## 2022-07-02 LAB — LIPID PANEL W/REFLEX DIRECT LDL
Cholesterol: 125 mg/dL (ref <200)
HDL: 34 mg/dL — ABNORMAL LOW (ref >=50)
LDL Cholesterol (Calc): 73 mg/dL (calc)
Non-HDL Cholesterol (Calc): 91 mg/dL (calc) (ref <130)
Total CHOL/HDL Ratio: 3.7 (calc) (ref <5.0)
Triglycerides: 95 mg/dL (ref <150)

## 2022-07-02 LAB — B12 AND FOLATE PANEL
Folate: 16.2 ng/mL
Vitamin B-12: 382 pg/mL (ref 200–1100)

## 2022-07-03 DIAGNOSIS — R42 Dizziness and giddiness: Secondary | ICD-10-CM | POA: Insufficient documentation

## 2022-07-03 NOTE — Assessment & Plan Note (Signed)
Well controlled on current regimen of losartan hct  and metoprolol Renal function stable, no changes today.

## 2022-07-03 NOTE — Assessment & Plan Note (Signed)
Suggested by ultrasound. Continue statin , GLP 1 agonist and weight loss  Lab Results  Component Value Date   ALT 10 07/01/2022   AST 14 07/01/2022   ALKPHOS 132 (H) 09/21/2021   BILITOT 0.5 07/01/2022

## 2022-07-03 NOTE — Assessment & Plan Note (Signed)
Currently well-controlled on current medications .  hemoglobin A1c is at goal of less than 7.0 . Patient is reminded to schedule an annual eye exam and foot exam is normal today. Patient has treated  microalbuminuria. Patient is tolerating statin therapy for CAD risk reduction and on ACE/ARB for renal protection and hypertension    Lab Results  Component Value Date   HGBA1C 6.3 (A) 06/30/2022   Lab Results  Component Value Date   CREATININE 0.69 07/01/2022   Lab Results  Component Value Date   CHOL 125 07/01/2022   HDL 34 (L) 07/01/2022   LDLCALC 73 07/01/2022   LDLDIRECT 88.0 09/21/2021   TRIG 95 07/01/2022   CHOLHDL 3.7 07/01/2022

## 2022-07-03 NOTE — Assessment & Plan Note (Signed)
Now on ozempic. Last a1c 6.0  Managed by Endocrinology

## 2022-07-03 NOTE — Assessment & Plan Note (Signed)
Managed with losartan. BP is at goal   +l

## 2022-07-03 NOTE — Assessment & Plan Note (Signed)
Suspected by history odf symptomatic palpitations.  Continue metoprolol   12.5 mg daily

## 2022-07-03 NOTE — Assessment & Plan Note (Addendum)
Now well controlled with signigicant weight loss using Ozempic.  Continue losartan,  statin   Lab Results  Component Value Date   HGBA1C 6.3 (A) 06/30/2022   Lab Results  Component Value Date   LABMICR 24.7 07/06/2017   MICROALBUR 2.5 (H) 10/23/2018   MICROALBUR 11.5 (H) 11/20/2017   Lab Results  Component Value Date   CREATININE 0.69 07/01/2022

## 2022-07-03 NOTE — Assessment & Plan Note (Signed)
Positional, new onset.  Neuro exam normal except for 2-3 beats of nystagmus with lateral gaze. Increase fluid intake, start prednisone taper.  Refer to ENT if persistent

## 2022-07-14 ENCOUNTER — Other Ambulatory Visit: Payer: Self-pay

## 2022-07-15 ENCOUNTER — Ambulatory Visit: Payer: Commercial Managed Care - PPO | Admitting: Internal Medicine

## 2022-07-26 ENCOUNTER — Ambulatory Visit (INDEPENDENT_AMBULATORY_CARE_PROVIDER_SITE_OTHER): Payer: Commercial Managed Care - PPO | Admitting: Obstetrics & Gynecology

## 2022-07-26 ENCOUNTER — Other Ambulatory Visit (HOSPITAL_COMMUNITY)
Admission: RE | Admit: 2022-07-26 | Discharge: 2022-07-26 | Disposition: A | Discharge status: Home / Self Care | Payer: Commercial Managed Care - PPO | Source: Ambulatory Visit | Attending: Obstetrics & Gynecology | Admitting: Obstetrics & Gynecology

## 2022-07-26 ENCOUNTER — Encounter: Payer: Self-pay | Admitting: Obstetrics & Gynecology

## 2022-07-26 VITALS — BP 120/78 | HR 69 | Wt 178.0 lb

## 2022-07-26 DIAGNOSIS — Z01419 Encounter for gynecological examination (general) (routine) without abnormal findings: Secondary | ICD-10-CM | POA: Diagnosis not present

## 2022-07-26 DIAGNOSIS — Z113 Encounter for screening for infections with a predominantly sexual mode of transmission: Secondary | ICD-10-CM | POA: Diagnosis not present

## 2022-07-26 NOTE — Progress Notes (Signed)
GYNECOLOGY ANNUAL PREVENTATIVE CARE ENCOUNTER NOTE  History:     [name] is a 52 y.o. [phone] female here for a routine annual gynecologic exam.  She is s/p TAH/LSO for pelvic pain and AUB in 2006. Current complaints: some hot flashes and night sweats, not debilitating.   Denies abnormal vaginal bleeding, discharge, pelvic pain, problems with intercourse or other gynecologic concerns.    Gynecologic History No LMP recorded. Patient has had a hysterectomy. Last mammogram: 06/23/2022. Results were: normal. Last colonoscopy: 12/03/2020. Results were: normal.  Obstetric History OB History  Gravida Para Term Preterm AB Living  3 2 2 $ 0 1 0  SAB IAB Ectopic Multiple Live Births  0 0 1 0 2    # Outcome Date GA Lbr Len/2nd Weight Sex Delivery Anes PTL Lv  3 Term           2 Term           1 Ectopic             Past Medical History:  Diagnosis Date   Allergy    Anemia    Anxiety    claustrophobic   Asthma    Back pain    Biceps tendonosis of right shoulder    Cataract    Mixed OU   COVID-19    covid PNA hospitalized 2020, 06/06/21   Diabetes mellitus 2011   did not start metforfin, losing weight   Dyspnea    Fatty liver    Food allergy    Frozen shoulder    left, had steroid injection 10/05/21   Headache disorder    History of concussion    HLD (hyperlipidemia)    Hypertension    Hypertensive retinopathy    OU   IBS (irritable bowel syndrome)    Infertility, female    Joint pain    Lactose intolerance    Leg edema    Migraines    Palpitation    Post-menopausal    Seborrheic dermatitis    Shoulder impingement syndrome, right    Uveitis    [place] [facility] in 2020 seen specialist in [place] [facility] last seen 11 or 05/2020   Vitamin D deficiency     Past Surgical History:  Procedure Laterality Date   ABDOMINAL HYSTERECTOMY  2006   heavy menses, endometriosis, l oophrectomy   BACK SURGERY     BREAST BIOPSY Right 2018   benign   BREAST EXCISIONAL BIOPSY  Right 2003   benign   BREAST EXCISIONAL BIOPSY Right 1999   benign   BREAST SURGERY     right breast x 2 , benign   COLONOSCOPY  10/06/2020   HERNIA REPAIR  2003   left inguinal    LEFT OOPHORECTOMY     SHOULDER ARTHROSCOPY WITH SUBACROMIAL DECOMPRESSION AND OPEN ROTATOR C Right 07/14/2016   Procedure: RIGHT SHOULDER ARTHROSCOPY WITH SUBACROMIAL DECOMPRESSION, DISTAL CLAVICLE RESECTION AND MINI OPEN ROTATOR CUFF REPAIR, OPEN BICEP TENDODESIS;  Surgeon: Garald Balding, MD;  Location: [facility];  Service: Orthopedics;  Laterality: Right;   SHOULDER CLOSED REDUCTION Right 09/08/2016   Procedure: RIGHT CLOSED MANIPULATION SHOULDER;  Surgeon: Garald Balding, MD;  Location: [facility];  Service: Orthopedics;  Laterality: Right;   TUBAL LIGATION     UPPER GASTROINTESTINAL ENDOSCOPY  10/06/2020    Current Outpatient Medications on File Prior to Visit  Medication Sig Dispense Refill   albuterol (VENTOLIN HFA) 108 (90 Base) MCG/ACT inhaler  Inhale 2 puffs into the lungs every 6 (six) hours as needed for wheezing or shortness of breath. 18 g 0   aspirin EC 81 MG tablet Take 1 tablet (81 mg total) by mouth daily. 30 tablet 1   Atogepant (QULIPTA) 60 MG TABS Take 1 tablet by mouth daily. 30 tablet 3   atorvastatin (LIPITOR) 20 MG tablet Take 1 tablet (20 mg total) by mouth daily. 90 tablet 3   baclofen (LIORESAL) 10 MG tablet Take 1 tablet (10 mg total) by mouth 2 (two) times daily as needed for muscle spasms. baclofen 10 mg tablet Strength: 10 mg 60 tablet 3   Blood Glucose Monitoring Suppl (FREESTYLE LITE) DEVI 1 each by Does not apply route 2 (two) times daily. E11.9 1 each 0   Blood Pressure Monitoring (OMRON 3 SERIES BP MONITOR) DEVI Use as directed 1 each 0   butalbital-acetaminophen-caffeine (FIORICET) 50-325-40 MG tablet Take 1 tablet by mouth every 6 (six) hours as needed for headache. Do not refill in less than 30 day 30 tablet 1   Continuous Blood Gluc  Sensor (DEXCOM G7 SENSOR) MISC 3 each by Does not apply route every 30 (thirty) days. Apply 1 sensor every 10 days 9 each 4   diazepam (VALIUM) 5 MG tablet Take 1 tablet (5 mg total) by mouth every 12 (twelve) hours as needed for anxiety. 30 tablet 0   dicyclomine (BENTYL) 20 MG tablet Take 1 tablet (20 mg total) by mouth every 6 (six) hours. 90 tablet 1   EPINEPHrine 0.3 mg/0.3 mL IJ SOAJ injection Inject 0.3 mg into the muscle as needed for anaphylaxis. 2 each 0   gabapentin (NEURONTIN) 300 MG capsule Take 1 capsule (300 mg total) by mouth 3 (three) times daily. Has taken before without issue. 30 capsule 1   glucose blood (FREESTYLE LITE) test strip 1 each by Other route 2 (two) times daily. E11.9 200 each 0   Insulin Pen Needle (UNIFINE PENTIPS) 32G X 4 MM MISC Use 1-2x a day 200 each 4   Ipratropium-Albuterol (COMBIVENT) 20-100 MCG/ACT AERS respimat Inhale 1 puff into the lungs every 6 (six) hours. 4 g 11   Lancets (FREESTYLE) lancets 1 each by Other route 2 (two) times daily. E11.9 200 each 0   losartan-hydrochlorothiazide (HYZAAR) 50-12.5 MG tablet Take 1 tablet by mouth daily. 90 tablet 1   meloxicam (MOBIC) 15 MG tablet Take 1 tablet (15 mg total) by mouth daily. 30 tablet 5   metFORMIN (GLUCOPHAGE-XR) 500 MG 24 hr tablet Take 1 tablet (500 mg total) by mouth daily with supper. 90 tablet 3   metoprolol succinate (TOPROL-XL) 25 MG 24 hr tablet Take 0.5 tablets (12.5 mg total) by mouth daily. 45 tablet 3   ondansetron (ZOFRAN) 8 MG tablet Take 1 tablet (8 mg total) by mouth every 8 (eight) hours as needed for nausea or vomiting. 20 tablet 0   ondansetron (ZOFRAN-ODT) 4 MG disintegrating tablet Take 1 tablet (4 mg total) by mouth every 8 (eight) hours as needed for nausea or vomiting. 20 tablet 0   Semaglutide, 2 MG/DOSE, (OZEMPIC, 2 MG/DOSE,) 8 MG/3ML SOPN Inject 2 mg into the skin once a week. 9 mL 3   triamcinolone cream (KENALOG) 0.1 % Apply 1 Application topically 2 (two) times daily. 30 g  0   Ubrogepant (UBRELVY) 100 MG TABS Take 100 mg by mouth as needed. May repeat a dose in 2 hours if needed. Max dose 2 pills in 2 hours 16 tablet 6  Vitamin D, Ergocalciferol, (DRISDOL) 1.25 MG (50000 UNIT) CAPS capsule Take 1 capsule (50,000 Units total) by mouth once a week. 4 capsule 2   benzonatate (TESSALON) 100 MG capsule Take 1-2 capsules (100-200 mg total) by mouth 3 (three) times daily as needed. (Patient not taking: Reported on 05/27/2022) 30 capsule 0   pantoprazole (PROTONIX) 40 MG tablet Take 1 tablet (40 mg total) by mouth daily. Take only with water and take 30 minutes before eating. (Patient not taking: Reported on 07/01/2022) 90 tablet 3   No current facility-administered medications on file prior to visit.    Allergies  Allergen Reactions   Bamlanivimab Anaphylaxis, Itching, Palpitations, Rash, Shortness Of Breath and Swelling   Botox [Onabotulinumtoxina] Shortness Of Breath    syncope   Clindamycin/Lincomycin Other (See Comments)   Kiwi Extract Shortness Of Breath   Maxalt [Rizatriptan Benzoate] Anaphylaxis    Chest pain   Nitrous Oxide Shortness Of Breath    syncope   Rizatriptan Benzoate Anaphylaxis    Chest pain   Triptans Shortness Of Breath   Vyepti [Eptinezumab-Jjmr] Shortness Of Breath, Palpitations and Other (See Comments)    Throat soreness, tachycardia   Aspirin Nausea And Vomiting    Mouth blisters   Contrast Media [Iodinated Contrast Media]    Erythromycin    Flagyl [Metronidazole]    Haemophilus Influenzae Vaccines    Latex     Sometimes causes rash   Mango Flavor    Vicodin [Hydrocodone-Acetaminophen]     hallucinations   Influenza Virus Vaccine Palpitations   Penicillins Nausea And Vomiting, Rash and Other (See Comments)    Did it involve swelling of the face/tongue/throat, SOB, or low BP? No Did it involve sudden or severe rash/hives, skin peeling, or any reaction on the inside of your mouth or nose? No Did you need to seek medical attention  at a hospital or doctor's office? Yes When did it last happen?     patient was 52 years old  If all above answers are "NO", may proceed with cephalosporin use.   Tetracyclines & Related Nausea And Vomiting and Rash    Social History:  reports that she has never smoked. She has never used smokeless tobacco. She reports that she does not drink alcohol and does not use drugs.  Family History  Problem Relation Age of Onset   Diabetes Mother    Heart disease Mother 60   Hypertension Mother    Hyperlipidemia Mother    Heart failure Mother    Stroke Mother    Thyroid disease Mother    Depression Mother    Sleep apnea Mother    Obesity Mother    Colon polyps Mother    Cancer Father 64       Lung Cancer   Heart disease Father    Mental illness Sister        bipolar, substance abuse,  clean 2 yrs   Hypertension Sister    Cancer Paternal Grandmother 40       breast cancer   Breast cancer Paternal Grandmother    Diabetes Maternal Grandmother    Hypertension Maternal Grandmother    Diabetes Son    Colon cancer Neg Hx    Esophageal cancer Neg Hx    Rectal cancer Neg Hx    Stomach cancer Neg Hx     The following portions of the patient's history were reviewed and updated as appropriate: allergies, current medications, past family history, past medical history, past social history, past surgical  history and problem list.  Review of Systems Pertinent items noted in HPI and remainder of comprehensive ROS otherwise negative.  Physical Exam:  BP 120/78   Pulse 69   Wt 178 lb (80.7 kg)   BMI 28.73 kg/m  CONSTITUTIONAL: Well-developed, well-nourished female in no acute distress.  HENT:  Normocephalic, atraumatic, External right and left ear normal.  EYES: Conjunctivae and EOM are normal. Pupils are equal, round, and reactive to light. No scleral icterus.  NECK: Normal range of motion, supple, no masses.  Normal thyroid.  SKIN: Skin is warm and dry. No rash noted. Not diaphoretic. No  erythema. No pallor. MUSCULOSKELETAL: Normal range of motion. No tenderness.  No cyanosis, clubbing, or edema.   NEUROLOGIC: Alert and oriented to person, place, and time. Normal reflexes, muscle tone coordination.  PSYCHIATRIC: Normal mood and affect. Normal behavior. Normal judgment and thought content. CARDIOVASCULAR: Normal heart rate noted, regular rhythm RESPIRATORY: Effort and breath sounds normal, no problems with respiration noted. BREASTS: Deferred. ABDOMEN: Soft, no distention noted.  No tenderness, rebound or guarding.  PELVIC: Normal appearing external genitalia and urethral meatus; normal appearing vaginal mucosa and cuff. No irritation noted on vulva or vagina. Scant white discharge noted, sample obtained.  No adnexal tenderness.  Performed in the presence of a chaperone.   Assessment and Plan:      1. Routine screening for STI (sexually transmitted infection) Desires annual STI check. Labs drawn, will follow up results and manage accordingly. - Cervicovaginal ancillary only - RPR+HBsAg+HCVAb+HIV  2. Well woman exam with routine gynecological exam Normal exam today.   Briefly discussed vasomotor symptoms of menopause and management strategies, she will try Neurontin (she has this for another indication).  If this works, can give her refills as needed. If  symptoms not controlled/worsen, will discuss or try other options. Routine preventative health maintenance measures emphasized. Please refer to After Visit Summary for other counseling recommendations.      Verita Schneiders, MD, [place] for [employer], [place]

## 2022-07-27 LAB — RPR+HBSAG+HCVAB+...
HIV Screen 4th Generation wRfx: NONREACTIVE
Hep C Virus Ab: NONREACTIVE
Hepatitis B Surface Ag: NEGATIVE
RPR Ser Ql: NONREACTIVE

## 2022-07-28 LAB — CERVICOVAGINAL ANCILLARY ONLY
Chlamydia: NEGATIVE
Comment: NEGATIVE
Comment: NEGATIVE
Comment: NORMAL
Neisseria Gonorrhea: NEGATIVE
Trichomonas: NEGATIVE

## 2022-08-16 ENCOUNTER — Other Ambulatory Visit: Payer: Self-pay | Admitting: Internal Medicine

## 2022-08-16 ENCOUNTER — Other Ambulatory Visit (HOSPITAL_COMMUNITY): Payer: Self-pay

## 2022-08-16 ENCOUNTER — Encounter: Payer: Self-pay | Admitting: Internal Medicine

## 2022-08-16 MED ORDER — SEMAGLUTIDE (1 MG/DOSE) 4 MG/3ML ~~LOC~~ SOPN
1.0000 mg | PEN_INJECTOR | SUBCUTANEOUS | 1 refills | Status: DC
  Filled 2022-08-16 – 2022-08-24 (×2): qty 9, 84d supply, fill #0
  Filled 2022-08-25 – 2022-08-26 (×2): qty 3, 28d supply, fill #0
  Filled 2022-09-13 – 2022-09-19 (×2): qty 3, 28d supply, fill #1
  Filled 2022-10-20: qty 3, 28d supply, fill #2
  Filled 2022-11-28: qty 3, 28d supply, fill #3
  Filled 2022-12-29: qty 3, 28d supply, fill #4

## 2022-08-23 NOTE — Progress Notes (Unsigned)
Patient: [name] Date of Birth: 12-23-1970  Reason for Visit: Follow up History from: Patient Primary Neurologist: [name]  ASSESSMENT AND PLAN 52 y.o. year old female   1.  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 Consider diclofenac for rescue  -Follow-up in 6 months or sooner if needed, will do MyChart visit  HISTORY OF PRESENT ILLNESS: Today 08/24/22 When last seen 02/22/2022 headaches were improved with Qulipta.  [name] was given along with chlorzoxazone. No longer has debilitating headaches. Has only missed 2 days of work since September. Has quiet room at staff education. [name] has been very helpful. Started new job on 08/15/22, has had headache for about 2 weeks, not severe migraine. [name] works well, last week had to take 2 tablets, was 1st time had to take 2 in the same day. Has the same box originally prescribed. Has massage on Friday, deep tissue massage.   02/22/22 Dr. Billey [name] Interval History: Headaches have improved in frequency and severity since starting Qulipta. She has had about 9 headaches in the past month. Takes Excedrin as needed but has been trying not to take anything because she is concerned about rebound headaches. She continues to have significant neck tension and tightness. She is going to PT for her shoulder but doesn't feel this has been particularly helpful.   Headache days per month: 9 Headache free days per month: 21   Current Headache Regimen: Preventative: Qulipta 60 mg daily Abortive: Excedrin     Prior Therapies                                  Rescue: Baclofen Flexeril Robaxin Fioricet Maxalt - anaphylaxis Nurtec    Preventive: Ajovy Emgality Vyepti - chest pain Botox - bradycardia, chest pain, shortness of breath Topamax - eye pressure Zonisamide Effexor - diarrhea Nortriptyline Verapamil Propranolol Nerve block  REVIEW OF SYSTEMS: Out of a complete 14 system review of symptoms, the patient complains only of the following symptoms, and all other reviewed systems are negative.  See HPI  ALLERGIES: Allergies  Allergen Reactions   Bamlanivimab Anaphylaxis, Itching, Palpitations, Rash, Shortness Of Breath and Swelling   Botox [Onabotulinumtoxina] Shortness Of Breath    syncope   Clindamycin/Lincomycin Other (See Comments)   Kiwi Extract Shortness Of Breath   Maxalt [Rizatriptan Benzoate] Anaphylaxis    Chest pain   Nitrous Oxide Shortness Of Breath    syncope   Rizatriptan Benzoate Anaphylaxis    Chest pain   Triptans Shortness Of Breath   Vyepti [Eptinezumab-Jjmr] Shortness Of Breath, Palpitations and Other (See Comments)    Throat soreness, tachycardia   Aspirin Nausea And Vomiting    Mouth blisters   Contrast Media [Iodinated Contrast Media]    Erythromycin    Flagyl [Metronidazole]    Haemophilus Influenzae Vaccines    Latex     Sometimes causes rash   Mango Flavor    Vicodin [Hydrocodone-Acetaminophen]     hallucinations   Influenza Virus Vaccine Palpitations   Penicillins Nausea And Vomiting, Rash and Other (See Comments)    Did it involve swelling of the face/tongue/throat, SOB, or low BP? No Did it involve sudden or severe rash/hives,  skin peeling, or any reaction on the inside of your mouth or nose? No Did you need to seek medical attention at a hospital or doctor's office? Yes When did it last happen?     patient was 52 years old  If all above answers are "NO", may proceed with cephalosporin use.   Tetracyclines & Related Nausea And Vomiting and Rash    HOME MEDICATIONS: Outpatient Medications Prior to Visit  Medication Sig Dispense Refill   albuterol (VENTOLIN  HFA) 108 (90 Base) MCG/ACT inhaler Inhale 2 puffs into the lungs every 6 (six) hours as needed for wheezing or shortness of breath. 18 g 0   aspirin EC 81 MG tablet Take 1 tablet (81 mg total) by mouth daily. 30 tablet 1   atorvastatin (LIPITOR) 20 MG tablet Take 1 tablet (20 mg total) by mouth daily. 90 tablet 3   baclofen (LIORESAL) 10 MG tablet Take 1 tablet (10 mg total) by mouth 2 (two) times daily as needed for muscle spasms. baclofen 10 mg tablet Strength: 10 mg 60 tablet 3   Blood Glucose Monitoring Suppl (FREESTYLE LITE) DEVI 1 each by Does not apply route 2 (two) times daily. E11.9 1 each 0   Blood Pressure Monitoring (OMRON 3 SERIES BP MONITOR) DEVI Use as directed 1 each 0   butalbital-acetaminophen-caffeine (FIORICET) 50-325-40 MG tablet Take 1 tablet by mouth every 6 (six) hours as needed for headache. Do not refill in less than 30 day 30 tablet 1   Continuous Blood Gluc Sensor (DEXCOM G7 SENSOR) MISC 3 each by Does not apply route every 30 (thirty) days. Apply 1 sensor every 10 days 9 each 4   diazepam (VALIUM) 5 MG tablet Take 1 tablet (5 mg total) by mouth every 12 (twelve) hours as needed for anxiety. 30 tablet 0   dicyclomine (BENTYL) 20 MG tablet Take 1 tablet (20 mg total) by mouth every 6 (six) hours. 90 tablet 1   gabapentin (NEURONTIN) 300 MG capsule Take 1 capsule (300 mg total) by mouth 3 (three) times daily. Has taken before without issue. 30 capsule 1   glucose blood (FREESTYLE LITE) test strip 1 each by Other route 2 (two) times daily. E11.9 200 each 0   Insulin Pen Needle (UNIFINE PENTIPS) 32G X 4 MM MISC Use 1-2x a day 200 each 4   Ipratropium-Albuterol (COMBIVENT) 20-100 MCG/ACT AERS respimat Inhale 1 puff into the lungs every 6 (six) hours. 4 g 11   Lancets (FREESTYLE) lancets 1 each by Other route 2 (two) times daily. E11.9 200 each 0   losartan-hydrochlorothiazide (HYZAAR) 50-12.5 MG tablet Take 1 tablet by mouth daily. 90 tablet 1   meloxicam (MOBIC) 15 MG tablet  Take 1 tablet (15 mg total) by mouth daily. (Patient taking differently: Take 15 mg by mouth as needed.) 30 tablet 5   metFORMIN (GLUCOPHAGE-XR) 500 MG 24 hr tablet Take 1 tablet (500 mg total) by mouth daily with supper. 90 tablet 3   metoprolol succinate (TOPROL-XL) 25 MG 24 hr tablet Take 0.5 tablets (12.5 mg total) by mouth daily. 45 tablet 3   ondansetron (ZOFRAN) 8 MG tablet Take 1 tablet (8 mg total) by mouth every 8 (eight) hours as needed for nausea or vomiting. 20 tablet 0   ondansetron (ZOFRAN-ODT) 4 MG disintegrating tablet Take 1 tablet (4 mg total) by mouth every 8 (eight) hours as needed for nausea or vomiting. 20 tablet 0   pantoprazole (PROTONIX) 40 MG tablet Take 1 tablet (40 mg total)  by mouth daily. Take only with water and take 30 minutes before eating. 90 tablet 3   Semaglutide, 1 MG/DOSE, 4 MG/3ML SOPN Inject 1 mg as directed once a week. 9 mL 1   Atogepant (QULIPTA) 60 MG TABS Take 1 tablet by mouth daily. 30 tablet 3   Ubrogepant (UBRELVY) 100 MG TABS Take 100 mg by mouth as needed. May repeat a dose in 2 hours if needed. Max dose 2 pills in 2 hours 16 tablet 6   EPINEPHrine 0.3 mg/0.3 mL IJ SOAJ injection Inject 0.3 mg into the muscle as needed for anaphylaxis. (Patient not taking: Reported on 08/24/2022) 2 each 0   Vitamin D, Ergocalciferol, (DRISDOL) 1.25 MG (50000 UNIT) CAPS capsule Take 1 capsule (50,000 Units total) by mouth once a week. (Patient not taking: Reported on 08/24/2022) 4 capsule 2   benzonatate (TESSALON) 100 MG capsule Take 1-2 capsules (100-200 mg total) by mouth 3 (three) times daily as needed. (Patient not taking: Reported on 05/27/2022) 30 capsule 0   triamcinolone cream (KENALOG) 0.1 % Apply 1 Application topically 2 (two) times daily. 30 g 0   No facility-administered medications prior to visit.    PAST MEDICAL HISTORY: Past Medical History:  Diagnosis Date   Allergy    Anemia    Anxiety    claustrophobic   Asthma    Back pain    Biceps  tendonosis of right shoulder    Cataract    Mixed OU   COVID-19    covid PNA hospitalized 2020, 06/06/21   Diabetes mellitus 2011   did not start metforfin, losing weight   Dyspnea    Fatty liver    Food allergy    Frozen shoulder    left, had steroid injection 10/05/21   Headache disorder    History of concussion    HLD (hyperlipidemia)    Hypertension    Hypertensive retinopathy    OU   IBS (irritable bowel syndrome)    Infertility, female    Joint pain    Lactose intolerance    Leg edema    Migraines    Palpitation    Post-menopausal    Seborrheic dermatitis    Shoulder impingement syndrome, right    Uveitis    [facility] in 2020 seen specialist in [place] [facility] last seen 11 or 05/2020   Vitamin D deficiency     PAST SURGICAL HISTORY: Past Surgical History:  Procedure Laterality Date   ABDOMINAL HYSTERECTOMY  2006   heavy menses, endometriosis, l oophrectomy   BACK SURGERY     BREAST BIOPSY Right 2018   benign   BREAST EXCISIONAL BIOPSY Right 2003   benign   BREAST EXCISIONAL BIOPSY Right 1999   benign   BREAST SURGERY     right breast x 2 , benign   COLONOSCOPY  10/06/2020   HERNIA REPAIR  2003   left inguinal    LEFT OOPHORECTOMY     SHOULDER ARTHROSCOPY WITH SUBACROMIAL DECOMPRESSION AND OPEN ROTATOR C Right 07/14/2016   Procedure: RIGHT SHOULDER ARTHROSCOPY WITH SUBACROMIAL DECOMPRESSION, DISTAL CLAVICLE RESECTION AND MINI OPEN ROTATOR CUFF REPAIR, OPEN BICEP TENDODESIS;  Surgeon: Garald Balding, MD;  Location: [facility];  Service: Orthopedics;  Laterality: Right;   SHOULDER CLOSED REDUCTION Right 09/08/2016   Procedure: RIGHT CLOSED MANIPULATION SHOULDER;  Surgeon: Garald Balding, MD;  Location: [facility];  Service: Orthopedics;  Laterality: Right;   TUBAL LIGATION     UPPER  GASTROINTESTINAL ENDOSCOPY  10/06/2020    FAMILY HISTORY: Family History  Problem Relation Age of Onset   Diabetes Mother    Heart  disease Mother 61   Hypertension Mother    Hyperlipidemia Mother    Heart failure Mother    Stroke Mother    Thyroid disease Mother    Depression Mother    Sleep apnea Mother    Obesity Mother    Colon polyps Mother    Cancer Father 39       Lung Cancer   Heart disease Father    Mental illness Sister        bipolar, substance abuse,  clean 2 yrs   Hypertension Sister    Cancer Paternal Grandmother 5       breast cancer   Breast cancer Paternal Grandmother    Diabetes Maternal Grandmother    Hypertension Maternal Grandmother    Diabetes Son    Colon cancer Neg Hx    Esophageal cancer Neg Hx    Rectal cancer Neg Hx    Stomach cancer Neg Hx     SOCIAL HISTORY: Social History   Socioeconomic History   Marital status: Divorced    Spouse name: Not on file   Number of children: 2   Years of education: 14   Highest education level: Conservator, museum/gallery (e.g., MA, MS, MEng, MEd, MSW, MBA)  Occupational History   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   Right-handed.   1-3 cups caffeine weekly.   Social Determinants of Health   Financial Resource Strain: Not on file  Food Insecurity: Not on file  Transportation Needs: Not on file  Physical Activity: Not on file  Stress: Not on file  Social Connections: Not on file  Intimate Partner Violence: Not on file    PHYSICAL EXAM  Vitals:   08/24/22 1556  BP: 111/76  Pulse: 92  Weight: 180 lb 8 oz (81.9 kg)  Height: 5\' 6"  (1.676 m)   Body mass index is 29.13 kg/m.  Generalized: Well developed, in no acute distress  Neurological examination   Mentation: Alert oriented to time, place, history taking. Follows all commands speech and language fluent Cranial nerve II-XII: Pupils were equal round reactive to light. Extraocular movements were full, visual field were full on confrontational test. Facial sensation and strength were normal. Head turning and shoulder shrug  were normal and symmetric. Motor: The motor testing reveals 5 over 5 strength of all 4 extremities. Good symmetric motor tone is noted throughout.  Sensory: Sensory testing is intact to soft touch on all 4 extremities. No evidence of extinction is noted.  Coordination: Cerebellar testing reveals good finger-nose-finger and heel-to-shin bilaterally.  Gait and station: Gait is normal.  Reflexes: Deep tendon reflexes are symmetric and normal bilaterally.   DIAGNOSTIC DATA (LABS, IMAGING, TESTING) - I reviewed patient records, labs, notes, testing and imaging myself where available.  Lab Results  Component Value Date   WBC 6.6 07/01/2022   HGB 12.9 07/01/2022   HCT 37.5 07/01/2022  MCV 86.2 07/01/2022   PLT 203 07/01/2022      Component Value Date/Time   NA 143 07/01/2022 1527   NA 140 07/17/2020 0820   NA 141 06/14/2012 1524   K 3.8 07/01/2022 1527   K 3.6 06/14/2012 1524   CL 105 07/01/2022 1527   CL 110 (H) 06/14/2012 1524   CO2 29 07/01/2022 1527   CO2 27 06/14/2012 1524   GLUCOSE 94 07/01/2022 1527   GLUCOSE 88 06/14/2012 1524   BUN 10 07/01/2022 1527   BUN 8 07/17/2020 0820   BUN 17 06/14/2012 1524   CREATININE 0.69 07/01/2022 1527   CALCIUM 9.5 07/01/2022 1527   CALCIUM 9.0 06/14/2012 1524   PROT 6.9 07/01/2022 1527   PROT 6.4 07/17/2020 0820   PROT 7.8 06/14/2012 1524   ALBUMIN 4.3 09/21/2021 1539   ALBUMIN 4.4 07/17/2020 0820   ALBUMIN 4.0 06/14/2012 1524   AST 14 07/01/2022 1527   AST 17 06/14/2012 1524   ALT 10 07/01/2022 1527   ALT 19 06/14/2012 1524   ALKPHOS 132 (H) 09/21/2021 1539   ALKPHOS 101 06/14/2012 1524   BILITOT 0.5  07/01/2022 1527   BILITOT 0.2 07/17/2020 0820   BILITOT 0.3 06/14/2012 1524   GFRNONAA >60 07/20/2020 1448   GFRNONAA >60 06/14/2012 1524   GFRAA 109 07/17/2020 0820   GFRAA >60 06/14/2012 1524   Lab Results  Component Value Date   CHOL 125 07/01/2022   HDL 34 (L) 07/01/2022   LDLCALC 73 07/01/2022   LDLDIRECT 88.0 09/21/2021   TRIG 95 07/01/2022   CHOLHDL 3.7 07/01/2022   Lab Results  Component Value Date   HGBA1C 6.3 (A) 06/30/2022   Lab Results  Component Value Date   VITAMINB12 382 07/01/2022   Lab Results  Component Value Date   TSH 0.48 09/21/2021    [name], AGNP-C, DNP 08/24/2022, 4:16 PM [facility] [address] [place], [place] [address] [phone]

## 2022-08-24 ENCOUNTER — Ambulatory Visit: Payer: Commercial Managed Care - PPO | Admitting: Neurology

## 2022-08-24 ENCOUNTER — Other Ambulatory Visit (HOSPITAL_COMMUNITY): Payer: Self-pay

## 2022-08-24 ENCOUNTER — Encounter: Payer: Self-pay | Admitting: Neurology

## 2022-08-24 VITALS — BP 111/76 | HR 92 | Ht 66.0 in | Wt 180.5 lb

## 2022-08-24 DIAGNOSIS — G43709 Chronic migraine without aura, not intractable, without status migrainosus: Secondary | ICD-10-CM

## 2022-08-24 MED ORDER — TIZANIDINE HCL 2 MG PO TABS
2.0000 mg | ORAL_TABLET | Freq: Four times a day (QID) | ORAL | 0 refills | Status: DC | PRN
  Filled 2022-08-24: qty 20, 5d supply, fill #0

## 2022-08-24 MED ORDER — UBRELVY 100 MG PO TABS
100.0000 mg | ORAL_TABLET | ORAL | 6 refills | Status: DC | PRN
  Filled 2022-08-24: qty 16, 30d supply, fill #0

## 2022-08-24 MED ORDER — QULIPTA 60 MG PO TABS
1.0000 | ORAL_TABLET | Freq: Every day | ORAL | 11 refills | Status: DC
  Filled 2022-08-24 – 2022-09-19 (×3): qty 30, 30d supply, fill #0
  Filled 2022-10-20: qty 30, 30d supply, fill #1
  Filled 2022-11-28: qty 30, 30d supply, fill #2
  Filled 2023-02-02 – 2023-03-06 (×2): qty 30, 30d supply, fill #3

## 2022-08-24 NOTE — Patient Instructions (Signed)
Continue Qulipta, use Ubrelvy as needed, may add in tizanidine for prolonged headache

## 2022-08-25 ENCOUNTER — Other Ambulatory Visit (HOSPITAL_COMMUNITY): Payer: Self-pay

## 2022-08-26 ENCOUNTER — Other Ambulatory Visit (HOSPITAL_COMMUNITY): Payer: Self-pay

## 2022-08-26 MED FILL — Ergocalciferol Cap 1.25 MG (50000 Unit): ORAL | 28 days supply | Qty: 4 | Fill #1 | Status: CN

## 2022-08-26 MED FILL — Ergocalciferol Cap 1.25 MG (50000 Unit): ORAL | 28 days supply | Qty: 4 | Fill #0 | Status: AC

## 2022-09-06 ENCOUNTER — Other Ambulatory Visit (HOSPITAL_COMMUNITY): Payer: Self-pay

## 2022-09-06 ENCOUNTER — Other Ambulatory Visit: Payer: Self-pay

## 2022-09-06 ENCOUNTER — Encounter: Payer: Self-pay | Admitting: Neurology

## 2022-09-07 ENCOUNTER — Other Ambulatory Visit: Payer: Self-pay

## 2022-09-07 ENCOUNTER — Other Ambulatory Visit (HOSPITAL_COMMUNITY): Payer: Self-pay

## 2022-09-12 ENCOUNTER — Other Ambulatory Visit (HOSPITAL_COMMUNITY): Payer: Self-pay

## 2022-09-13 ENCOUNTER — Other Ambulatory Visit (HOSPITAL_COMMUNITY): Payer: Self-pay

## 2022-09-13 ENCOUNTER — Other Ambulatory Visit (HOSPITAL_BASED_OUTPATIENT_CLINIC_OR_DEPARTMENT_OTHER): Payer: Self-pay

## 2022-09-14 ENCOUNTER — Encounter: Payer: Self-pay | Admitting: Internal Medicine

## 2022-09-14 ENCOUNTER — Other Ambulatory Visit (HOSPITAL_COMMUNITY): Payer: Self-pay

## 2022-09-19 ENCOUNTER — Other Ambulatory Visit (HOSPITAL_COMMUNITY): Payer: Self-pay

## 2022-09-19 MED FILL — Ergocalciferol Cap 1.25 MG (50000 Unit): ORAL | 28 days supply | Qty: 4 | Fill #1 | Status: AC

## 2022-09-26 ENCOUNTER — Telehealth: Payer: Self-pay

## 2022-09-26 NOTE — Telephone Encounter (Signed)
Received a PA form for Ubrelvy-filled out form and faxed along with clinicals.

## 2022-09-26 NOTE — Telephone Encounter (Addendum)
Received a PA form via Fax-completed the form and faxed along with clinical notes on 09/26/2022.

## 2022-10-20 ENCOUNTER — Other Ambulatory Visit (HOSPITAL_COMMUNITY): Payer: Self-pay

## 2022-10-20 ENCOUNTER — Other Ambulatory Visit: Payer: Self-pay | Admitting: Internal Medicine

## 2022-10-20 DIAGNOSIS — E559 Vitamin D deficiency, unspecified: Secondary | ICD-10-CM

## 2022-10-21 ENCOUNTER — Other Ambulatory Visit (HOSPITAL_COMMUNITY): Payer: Self-pay

## 2022-10-21 MED ORDER — LOSARTAN POTASSIUM-HCTZ 50-12.5 MG PO TABS
1.0000 | ORAL_TABLET | Freq: Every day | ORAL | 1 refills | Status: DC
  Filled 2022-10-21: qty 90, 90d supply, fill #0
  Filled 2023-02-02 – 2023-03-01 (×2): qty 90, 90d supply, fill #1

## 2022-10-21 NOTE — Telephone Encounter (Signed)
Refilled: 06/20/2022 Last OV: 07/01/2022 Next OV: 01/02/2023

## 2022-10-22 ENCOUNTER — Other Ambulatory Visit (HOSPITAL_COMMUNITY): Payer: Self-pay

## 2022-10-22 MED ORDER — VITAMIN D (ERGOCALCIFEROL) 1.25 MG (50000 UNIT) PO CAPS
50000.0000 [IU] | ORAL_CAPSULE | ORAL | 11 refills | Status: DC
Start: 2022-10-22 — End: 2023-12-03
  Filled 2022-10-22: qty 4, 28d supply, fill #0
  Filled 2022-11-28: qty 4, 28d supply, fill #1
  Filled 2022-12-29: qty 4, 28d supply, fill #2
  Filled 2023-02-02 – 2023-03-01 (×2): qty 4, 28d supply, fill #3
  Filled 2023-03-27: qty 4, 28d supply, fill #4
  Filled 2023-04-25: qty 4, 28d supply, fill #5
  Filled 2023-06-20: qty 4, 28d supply, fill #6
  Filled 2023-07-12 – 2023-07-14 (×2): qty 4, 28d supply, fill #7
  Filled 2023-08-16: qty 4, 28d supply, fill #8
  Filled 2023-09-18: qty 4, 28d supply, fill #9
  Filled 2023-10-11 (×2): qty 4, 28d supply, fill #10

## 2022-10-24 ENCOUNTER — Other Ambulatory Visit: Payer: Self-pay

## 2022-10-24 ENCOUNTER — Other Ambulatory Visit (HOSPITAL_COMMUNITY): Payer: Self-pay

## 2022-10-25 ENCOUNTER — Other Ambulatory Visit (HOSPITAL_COMMUNITY): Payer: Self-pay

## 2022-11-10 ENCOUNTER — Encounter: Payer: Self-pay | Admitting: Neurology

## 2022-11-16 ENCOUNTER — Other Ambulatory Visit (HOSPITAL_COMMUNITY): Payer: Self-pay

## 2022-11-28 ENCOUNTER — Other Ambulatory Visit (HOSPITAL_COMMUNITY): Payer: Self-pay

## 2022-12-01 ENCOUNTER — Other Ambulatory Visit (HOSPITAL_COMMUNITY): Payer: Self-pay

## 2022-12-02 ENCOUNTER — Other Ambulatory Visit (HOSPITAL_COMMUNITY): Payer: Self-pay

## 2022-12-29 ENCOUNTER — Other Ambulatory Visit: Payer: Self-pay | Admitting: Internal Medicine

## 2022-12-30 ENCOUNTER — Ambulatory Visit: Payer: Commercial Managed Care - PPO | Admitting: Internal Medicine

## 2022-12-30 ENCOUNTER — Other Ambulatory Visit (HOSPITAL_COMMUNITY): Payer: Self-pay

## 2022-12-30 ENCOUNTER — Other Ambulatory Visit: Payer: Self-pay

## 2022-12-30 MED ORDER — ATORVASTATIN CALCIUM 20 MG PO TABS
20.0000 mg | ORAL_TABLET | Freq: Every day | ORAL | 3 refills | Status: DC
  Filled 2022-12-30: qty 90, 90d supply, fill #0
  Filled 2023-04-25: qty 90, 90d supply, fill #1
  Filled 2023-09-05 – 2023-09-18 (×2): qty 90, 90d supply, fill #2

## 2023-01-02 ENCOUNTER — Telehealth: Payer: Commercial Managed Care - PPO | Admitting: Internal Medicine

## 2023-01-02 ENCOUNTER — Other Ambulatory Visit (HOSPITAL_COMMUNITY): Payer: Self-pay

## 2023-01-02 ENCOUNTER — Encounter: Payer: Self-pay | Admitting: Internal Medicine

## 2023-01-02 VITALS — BP 102/70 | HR 65 | Ht 66.0 in | Wt 180.0 lb

## 2023-01-02 DIAGNOSIS — E1169 Type 2 diabetes mellitus with other specified complication: Secondary | ICD-10-CM

## 2023-01-02 DIAGNOSIS — E785 Hyperlipidemia, unspecified: Secondary | ICD-10-CM | POA: Diagnosis not present

## 2023-01-02 DIAGNOSIS — Z7984 Long term (current) use of oral hypoglycemic drugs: Secondary | ICD-10-CM | POA: Diagnosis not present

## 2023-01-02 DIAGNOSIS — I10 Essential (primary) hypertension: Secondary | ICD-10-CM

## 2023-01-02 DIAGNOSIS — K760 Fatty (change of) liver, not elsewhere classified: Secondary | ICD-10-CM | POA: Diagnosis not present

## 2023-01-02 MED ORDER — DIAZEPAM 5 MG PO TABS
5.0000 mg | ORAL_TABLET | Freq: Two times a day (BID) | ORAL | 5 refills | Status: DC | PRN
  Filled 2023-01-02: qty 30, 15d supply, fill #0
  Filled 2023-04-25: qty 30, 15d supply, fill #1
  Filled 2023-06-20: qty 30, 15d supply, fill #2

## 2023-01-02 MED ORDER — BUTALBITAL-APAP-CAFFEINE 50-325-40 MG PO TABS
1.0000 | ORAL_TABLET | Freq: Four times a day (QID) | ORAL | 5 refills | Status: DC | PRN
  Filled 2023-01-02: qty 30, 8d supply, fill #0
  Filled 2023-04-25: qty 30, 8d supply, fill #1

## 2023-01-02 NOTE — Assessment & Plan Note (Signed)
Suggested by ultrasound. Continue statin , GLP 1 agonist and weight loss  Lab Results  Component Value Date   ALT 10 07/01/2022   AST 14 07/01/2022   ALKPHOS 132 (H) 09/21/2021   BILITOT 0.5 07/01/2022

## 2023-01-02 NOTE — Assessment & Plan Note (Signed)
Well controlled on current regimen of losartan hct  and metoprolol Renal function stable, no changes today.  

## 2023-01-02 NOTE — Assessment & Plan Note (Signed)
Currently well-controlled on Ozmpic 1 mg weekly and metformin  XR .Hypoglycemic episodes have resolved since reducing ozempic dose.  She welcomes the subsequent weight ain.  Patient has treated  microalbuminuria. Patient is tolerating statin therapy for CAD risk reduction and on ACE/ARB for renal protection and hypertension    Lab Results  Component Value Date   HGBA1C 6.3 (A) 06/30/2022   Lab Results  Component Value Date   CREATININE 0.69 07/01/2022   Lab Results  Component Value Date   CHOL 125 07/01/2022   HDL 34 (L) 07/01/2022   LDLCALC 73 07/01/2022   LDLDIRECT 88.0 09/21/2021   TRIG 95 07/01/2022   CHOLHDL 3.7 07/01/2022

## 2023-01-02 NOTE — Progress Notes (Signed)
Virtual Visit via Caregility   Note   This format is felt to be most appropriate for this patient at this time.  All issues noted in this document were discussed and addressed.  No physical exam was performed (except for noted visual exam findings with Video Visits).   I connected with [name]  01/02/23 at 10:30 AM EDT by a video enabled telemedicine application or telephone and verified that I am speaking with the correct person using two identifiers. Location patient: home Location provider: work or home office Persons participating in the virtual visit: patient, provider  I discussed the limitations, risks, security and privacy concerns of performing an evaluation and management service by telephone and the availability of in person appointments. I also discussed with the patient that there may be a patient responsible charge related to this service. The patient expressed understanding and agreed to proceed.  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  Has regained some weigh which she welcomes.  BS running 113 to 119 . Follows up with Endocrine in August  2) HTN:  bps averaging 100 to 110 systolic on reduced ose of losartan/hct  3) Migraines:  using fioricet for daytime use of headaches that occur at work.  Using ubrelvy for onset  diazepam as her muscle relaxer .Refill history confirmed via [place] Controlled Substance databas, accessed by me today..   ROS: See pertinent positives and negatives per HPI.  Past Medical History:  Diagnosis Date   Allergy    Anemia    Anxiety    claustrophobic   Asthma    Back pain    Biceps tendonosis of right shoulder    Cataract    Mixed OU   COVID-19    covid PNA hospitalized 2020, 06/06/21   Diabetes mellitus 2011   did not start metforfin, losing  weight   Dyspnea    Fatty liver    Food allergy    Frozen shoulder    left, had steroid injection 10/05/21   Headache disorder    History of concussion    HLD (hyperlipidemia)    Hypertension    Hypertensive retinopathy    OU   IBS (irritable bowel syndrome)    Infertility, female    Joint pain    Lactose intolerance    Leg edema    Migraines    Palpitation    Post-menopausal    Seborrheic dermatitis    Shoulder impingement syndrome, right    Uveitis    [facility] est in 2020 seen specialist in [place] [facility] last seen 11 or 05/2020   Vitamin D deficiency     Past Surgical History:  Procedure Laterality Date   ABDOMINAL HYSTERECTOMY  2006   heavy menses, endometriosis, l oophrectomy   BACK SURGERY     BREAST BIOPSY Right 2018   benign   BREAST EXCISIONAL BIOPSY Right 2003   benign   BREAST EXCISIONAL BIOPSY Right 1999   benign   BREAST SURGERY     right breast x 2 , benign   COLONOSCOPY  10/06/2020   HERNIA REPAIR  2003   left inguinal    LEFT OOPHORECTOMY     SHOULDER ARTHROSCOPY WITH SUBACROMIAL DECOMPRESSION AND OPEN ROTATOR C Right 07/14/2016   Procedure:  RIGHT SHOULDER ARTHROSCOPY WITH SUBACROMIAL DECOMPRESSION, DISTAL CLAVICLE RESECTION AND MINI OPEN ROTATOR CUFF REPAIR, OPEN BICEP TENDODESIS;  Surgeon: Valeria Batman, MD;  Location: [facility];  Service: Orthopedics;  Laterality: Right;   SHOULDER CLOSED REDUCTION Right 09/08/2016   Procedure: RIGHT CLOSED MANIPULATION SHOULDER;  Surgeon: Valeria Batman, MD;  Location: [facility];  Service: Orthopedics;  Laterality: Right;   TUBAL LIGATION     UPPER GASTROINTESTINAL ENDOSCOPY  10/06/2020    Family History  Problem Relation Age of Onset   Diabetes Mother    Heart disease Mother 82   Hypertension Mother    Hyperlipidemia Mother    Heart failure Mother    Stroke Mother    Thyroid disease Mother    Depression Mother    Sleep apnea Mother    Obesity Mother    Colon  polyps Mother    Cancer Father 62       Lung Cancer   Heart disease Father    Mental illness Sister        bipolar, substance abuse,  clean 2 yrs   Hypertension Sister    Cancer Paternal Grandmother 73       breast cancer   Breast cancer Paternal Grandmother    Diabetes Maternal Grandmother    Hypertension Maternal Grandmother    Diabetes Son    Colon cancer Neg Hx    Esophageal cancer Neg Hx    Rectal cancer Neg Hx    Stomach cancer Neg Hx     SOCIAL HX:  reports that she has never smoked. She has never used smokeless tobacco. She reports that she does not drink alcohol and does not use drugs.   Current Outpatient Medications:    albuterol (VENTOLIN HFA) 108 (90 Base) MCG/ACT inhaler, Inhale 2 puffs into the lungs every 6 (six) hours as needed for wheezing or shortness of breath., Disp: 18 g, Rfl: 0   aspirin EC 81 MG tablet, Take 1 tablet (81 mg total) by mouth daily., Disp: 30 tablet, Rfl: 1   Atogepant (QULIPTA) 60 MG TABS, Take 1 tablet (60 mg total) by mouth daily., Disp: 30 tablet, Rfl: 11   atorvastatin (LIPITOR) 20 MG tablet, Take 1 tablet (20 mg total) by mouth daily., Disp: 90 tablet, Rfl: 3   Blood Glucose Monitoring Suppl (FREESTYLE LITE) DEVI, 1 each by Does not apply route 2 (two) times daily. E11.9, Disp: 1 each, Rfl: 0   Blood Pressure Monitoring (OMRON 3 SERIES BP MONITOR) DEVI, Use as directed, Disp: 1 each, Rfl: 0   Continuous Glucose Sensor (DEXCOM G7 SENSOR) MISC, Apply 1 sensor every 10 days., Disp: 9 each, Rfl: 4   dicyclomine (BENTYL) 20 MG tablet, Take 1 tablet (20 mg total) by mouth every 6 (six) hours., Disp: 90 tablet, Rfl: 1   EPINEPHrine 0.3 mg/0.3 mL IJ SOAJ injection, Inject 0.3 mg into the muscle as needed for anaphylaxis., Disp: 2 each, Rfl: 0   glucose blood (FREESTYLE LITE) test strip, 1 each by Other route 2 (two) times daily. E11.9, Disp: 200 each, Rfl: 0   Insulin Pen Needle (UNIFINE PENTIPS) 32G X 4 MM MISC, Use 1-2x a day, Disp: 200 each, Rfl:  4   Ipratropium-Albuterol (COMBIVENT) 20-100 MCG/ACT AERS respimat, Inhale 1 puff into the lungs every 6 (six) hours., Disp: 4 g, Rfl: 11   Lancets (FREESTYLE) lancets, 1 each by Other route 2 (two) times daily. E11.9, Disp: 200 each, Rfl: 0   losartan-hydrochlorothiazide (HYZAAR)  50-12.5 MG tablet, Take 1 tablet by mouth daily., Disp: 90 tablet, Rfl: 1   meloxicam (MOBIC) 15 MG tablet, Take 1 tablet (15 mg total) by mouth daily. (Patient taking differently: Take 15 mg by mouth as needed.), Disp: 30 tablet, Rfl: 5   metFORMIN (GLUCOPHAGE-XR) 500 MG 24 hr tablet, Take 1 tablet (500 mg total) by mouth daily with supper., Disp: 90 tablet, Rfl: 3   metoprolol succinate (TOPROL-XL) 25 MG 24 hr tablet, Take 0.5 tablets (12.5 mg total) by mouth daily., Disp: 45 tablet, Rfl: 3   ondansetron (ZOFRAN-ODT) 4 MG disintegrating tablet, Take 1 tablet (4 mg total) by mouth every 8 (eight) hours as needed for nausea or vomiting., Disp: 20 tablet, Rfl: 0   Semaglutide, 1 MG/DOSE, 4 MG/3ML SOPN, Inject 1 mg into the skin once a week as directed., Disp: 9 mL, Rfl: 1   tiZANidine (ZANAFLEX) 2 MG tablet, Take 1 tablet (2 mg total) by mouth every 6 (six) hours as needed for muscle spasms., Disp: 20 tablet, Rfl: 0   Ubrogepant (UBRELVY) 100 MG TABS, Take 1 tablet (100 mg total) by mouth as needed. May repeat a dose in 2 hours if needed. Max dose 2 pills in 2 hours, Disp: 16 tablet, Rfl: 6   Vitamin D, Ergocalciferol, (DRISDOL) 1.25 MG (50000 UNIT) CAPS capsule, Take 1 capsule by mouth once a week., Disp: 4 capsule, Rfl: 11   butalbital-acetaminophen-caffeine (FIORICET) 50-325-40 MG tablet, Take 1 tablet by mouth every 6 (six) hours as needed for headache. Do not refill in less than 30 day, Disp: 30 tablet, Rfl: 5   diazepam (VALIUM) 5 MG tablet, Take 1 tablet (5 mg total) by mouth every 12 (twelve) hours as needed for anxiety., Disp: 30 tablet, Rfl: 5   ondansetron (ZOFRAN) 8 MG tablet, Take 1 tablet (8 mg total) by mouth  every 8 (eight) hours as needed for nausea or vomiting. (Patient not taking: Reported on 01/02/2023), Disp: 20 tablet, Rfl: 0  EXAM:  VITALS per patient if applicable:  GENERAL: alert, oriented, appears well and in no acute distress  HEENT: atraumatic, conjunttiva clear, no obvious abnormalities on inspection of external nose and ears  NECK: normal movements of the head and neck  LUNGS: on inspection no signs of respiratory distress, breathing rate appears normal, no obvious gross SOB, gasping or wheezing  CV: no obvious cyanosis  MS: moves all visible extremities without noticeable abnormality  PSYCH/NEURO: pleasant and cooperative, no obvious depression or anxiety, speech and thought processing grossly intact  ASSESSMENT AND PLAN: Hyperlipidemia associated with type 2 diabetes mellitus (HCC) Assessment & Plan: Currently well-controlled on Ozmpic 1 mg weekly and metformin  XR .Hypoglycemic episodes have resolved since reducing ozempic dose.  She welcomes the subsequent weight ain.  Patient has treated  microalbuminuria. Patient is tolerating statin therapy for CAD risk reduction and on ACE/ARB for renal protection and hypertension    Lab Results  Component Value Date   HGBA1C 6.3 (A) 06/30/2022   Lab Results  Component Value Date   CREATININE 0.69 07/01/2022   Lab Results  Component Value Date   CHOL 125 07/01/2022   HDL 34 (L) 07/01/2022   LDLCALC 73 07/01/2022   LDLDIRECT 88.0 09/21/2021   TRIG 95 07/01/2022   CHOLHDL 3.7 07/01/2022      Fatty infiltration of liver Assessment & Plan: Suggested by ultrasound. Continue statin , GLP 1 agonist and weight loss  Lab Results  Component Value Date   ALT 10 07/01/2022  AST 14 07/01/2022   ALKPHOS 132 (H) 09/21/2021   BILITOT 0.5 07/01/2022      Essential hypertension Assessment & Plan: Well controlled on current regimen of losartan hct  and metoprolol Renal function stable, no changes today.    Other  orders -     diazePAM; Take 1 tablet (5 mg total) by mouth every 12 (twelve) hours as needed for anxiety.  Dispense: 30 tablet; Refill: 5 -     Butalbital-APAP-Caffeine; Take 1 tablet by mouth every 6 (six) hours as needed for headache. Do not refill in less than 30 day  Dispense: 30 tablet; Refill: 5      I discussed the assessment and treatment plan with the patient. The patient was provided an opportunity to ask questions and all were answered. The patient agreed with the plan and demonstrated an understanding of the instructions.   The patient was advised to call back or seek an in-person evaluation if the symptoms worsen or if the condition fails to improve as anticipated.   I spent 30 minutes dedicated to the care of this patient on the date of this encounter to include pre-visit review of Eulene's medical history,  Face-to-face time with the patient  ir a virtual visit.  and post visit ordering of testing and therapeutics.    Sherlene Shams, MD

## 2023-01-02 NOTE — Assessment & Plan Note (Addendum)
Averagiing about 9 days per month.  Her headaches have been managed by neurology with preventive  Qulipta and prn use of  Ubrelvy for abortive .  Last episode was in June missed several days of work

## 2023-01-03 ENCOUNTER — Other Ambulatory Visit (HOSPITAL_COMMUNITY): Payer: Self-pay

## 2023-01-03 MED ORDER — DEXCOM G7 SENSOR MISC
3.0000 | 4 refills | Status: DC
  Filled 2023-01-03: qty 9, 90d supply, fill #0
  Filled 2023-03-27: qty 9, 90d supply, fill #1
  Filled 2023-07-12 – 2023-07-14 (×2): qty 9, 90d supply, fill #2
  Filled 2023-10-11 (×2): qty 9, 90d supply, fill #3

## 2023-01-05 ENCOUNTER — Other Ambulatory Visit (HOSPITAL_COMMUNITY): Payer: Self-pay

## 2023-01-17 ENCOUNTER — Other Ambulatory Visit (HOSPITAL_COMMUNITY): Payer: Self-pay

## 2023-01-17 ENCOUNTER — Encounter: Payer: Self-pay | Admitting: Internal Medicine

## 2023-01-17 ENCOUNTER — Ambulatory Visit: Payer: Commercial Managed Care - PPO | Admitting: Internal Medicine

## 2023-01-17 VITALS — BP 122/74 | HR 76 | Ht 66.0 in | Wt 190.8 lb

## 2023-01-17 DIAGNOSIS — E785 Hyperlipidemia, unspecified: Secondary | ICD-10-CM | POA: Diagnosis not present

## 2023-01-17 DIAGNOSIS — Z7984 Long term (current) use of oral hypoglycemic drugs: Secondary | ICD-10-CM | POA: Diagnosis not present

## 2023-01-17 DIAGNOSIS — E1140 Type 2 diabetes mellitus with diabetic neuropathy, unspecified: Secondary | ICD-10-CM

## 2023-01-17 DIAGNOSIS — Z7985 Long-term (current) use of injectable non-insulin antidiabetic drugs: Secondary | ICD-10-CM

## 2023-01-17 DIAGNOSIS — E7849 Other hyperlipidemia: Secondary | ICD-10-CM | POA: Diagnosis not present

## 2023-01-17 DIAGNOSIS — E1169 Type 2 diabetes mellitus with other specified complication: Secondary | ICD-10-CM

## 2023-01-17 DIAGNOSIS — E119 Type 2 diabetes mellitus without complications: Secondary | ICD-10-CM

## 2023-01-17 LAB — POCT GLYCOSYLATED HEMOGLOBIN (HGB A1C): Hemoglobin A1C: 5.9 % — AB (ref 4.0–5.6)

## 2023-01-17 MED ORDER — SEMAGLUTIDE (1 MG/DOSE) 4 MG/3ML ~~LOC~~ SOPN
1.0000 mg | PEN_INJECTOR | SUBCUTANEOUS | 1 refills | Status: DC
  Filled 2023-01-17 – 2023-03-01 (×3): qty 9, 84d supply, fill #0
  Filled 2023-06-20: qty 9, 84d supply, fill #1

## 2023-01-17 MED ORDER — METFORMIN HCL ER 500 MG PO TB24: 500.0000 mg | ORAL_TABLET | Freq: Every day | ORAL | Status: DC

## 2023-01-17 NOTE — Patient Instructions (Addendum)
Please continue: - Ozempic 1 mg weekly - Metformin 500 mg daily  Please return in 6 months.

## 2023-01-17 NOTE — Progress Notes (Signed)
Patient ID: [name], female   DOB: July 28, 1970, 52 y.o.   MRN: [id number]  HPI: KAMEESHA WESTRA is a 52 y.o.-year-old female, returning for follow-up for DM2, dx in 2013, with history of GDM in 1990 and 2004, insulin-dependent since 2018, uncontrolled, with complications (mild CKD, PN).  She was previously seen by Dr. Everardo All. Last visit with me 6.5 months ago.  Interim history: No increased urination, blurry vision, nausea, chest pain.   She gained 10 lbs since last OV - she is happier with her weight now.  Reviewed HbA1c: Lab Results  Component Value Date   HGBA1C 6.3 (A) 06/30/2022   HGBA1C 6.5 (A) 01/28/2022   HGBA1C 8.1 (A) 10/19/2021   HGBA1C 7.5 (A) 08/03/2021   HGBA1C 7.6 (A) 05/03/2021   HGBA1C 7.0 (A) 01/26/2021   HGBA1C 6.9 (A) 10/26/2020   HGBA1C 6.1 (A) 07/28/2020   HGBA1C 6.0 (A) 01/30/2020   HGBA1C 6.6 (A) 12/02/2019   Previous on: - Ozempic 2 mg weekly - Humalog 22 -24 (30) units 2x a day after meals (patient has mostly 2 meals a day) She tried Victoza >> dysphagia. She tried Jardiance >> yeast infections. She was taking Metformin 11/2020 after he had IBS symptoms.  Now on: - Ozempic 2 >> 1 mg weekly (decreased 08/2022 2/2 hypoglycemia) -  N/V/D with higher doses >> stopped >> 500 mg with dinner  - >> now off  Pt checks her sugars more than 4 times a day with her Dexcom G6 CGM:  Previously:  Previously:   Lowest sugar was 60s (felt it) >> 63 (sensor) >> 45; she has hypoglycemia awareness at 70.  Highest sugar was 300s >> .Marland KitchenMarland Kitchen220s >> 200 >> 190s.  -+ Stage IIIa CKD, last BUN/creatinine:  Lab Results  Component Value Date   BUN 10 07/01/2022   BUN 14 09/21/2021   CREATININE 0.69 07/01/2022   CREATININE 1.12 09/21/2021  She is not on ACE inhibitor/ARB.  -+ HL; last set of lipids: Lab Results  Component Value Date   CHOL 125 07/01/2022   HDL 34 (L) 07/01/2022   LDLCALC 73 07/01/2022   LDLDIRECT 88.0 09/21/2021   TRIG 95 07/01/2022    CHOLHDL 3.7 07/01/2022  On Lipitor 20 mg daily.  - last eye exam was in 08/2021. No DR reportedly. [facility].  - + numbness and tingling in her feet. Last foot exam: 07/01/2022.  On ASA 81.  Patient also has a history of HTN, fatty liver, IBS.  ROS: + see HPI  Past Medical History:  Diagnosis Date   Allergy    Anemia    Anxiety    claustrophobic   Asthma    Back pain    Biceps tendonosis of right shoulder    Cataract    Mixed OU   COVID-19    covid PNA hospitalized 2020, 06/06/21   Diabetes mellitus 2011   did not start metforfin, losing weight   Dyspnea    Fatty liver    Food allergy    Frozen shoulder    left, had steroid injection 10/05/21   Headache disorder    History of concussion    HLD (hyperlipidemia)    Hypertension    Hypertensive retinopathy    OU   IBS (irritable bowel syndrome)    Infertility, female    Joint pain    Lactose intolerance    Leg edema    Migraines    Palpitation    Post-menopausal    Seborrheic dermatitis  Shoulder impingement syndrome, right    Uveitis    [place] [facility] est in 2020 seen specialist in [place] [facility] last seen 11 or 05/2020   Vitamin D deficiency    Past Surgical History:  Procedure Laterality Date   ABDOMINAL HYSTERECTOMY  2006   heavy menses, endometriosis, l oophrectomy   BACK SURGERY     BREAST BIOPSY Right 2018   benign   BREAST EXCISIONAL BIOPSY Right 2003   benign   BREAST EXCISIONAL BIOPSY Right 1999   benign   BREAST SURGERY     right breast x 2 , benign   COLONOSCOPY  10/06/2020   HERNIA REPAIR  2003   left inguinal    LEFT OOPHORECTOMY     SHOULDER ARTHROSCOPY WITH SUBACROMIAL DECOMPRESSION AND OPEN ROTATOR C Right 07/14/2016   Procedure: RIGHT SHOULDER ARTHROSCOPY WITH SUBACROMIAL DECOMPRESSION, DISTAL CLAVICLE RESECTION AND MINI OPEN ROTATOR CUFF REPAIR, OPEN BICEP TENDODESIS;  Surgeon: Valeria Batman, MD;  Location: [facility];  Service: Orthopedics;  Laterality: Right;    SHOULDER CLOSED REDUCTION Right 09/08/2016   Procedure: RIGHT CLOSED MANIPULATION SHOULDER;  Surgeon: Valeria Batman, MD;  Location: [facility];  Service: Orthopedics;  Laterality: Right;   TUBAL LIGATION     UPPER GASTROINTESTINAL ENDOSCOPY  10/06/2020   Social History   Socioeconomic History   Marital status: Divorced    Spouse name: Not on file   Number of children: 2   Years of education: 14   Highest education level: Master's degree (e.g., MA, MS, MEng, MEd, MSW, MBA)  Occupational History   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   Right-handed.   1-3 cups caffeine weekly.   Social Determinants of Health   Financial Resource Strain: Not on file  Food Insecurity: Not on file  Transportation Needs: Not on file  Physical Activity: Not on file  Stress: Not on file  Social Connections: Not on file  Intimate Partner Violence: Not on file   Current Outpatient Medications on File Prior to Visit  Medication Sig Dispense Refill   albuterol (VENTOLIN HFA) 108 (90 Base) MCG/ACT inhaler Inhale 2 puffs into the lungs every 6 (six) hours as needed for wheezing or shortness of breath. 18 g 0   aspirin EC 81 MG tablet Take 1 tablet (81 mg total) by mouth daily. 30 tablet 1   Atogepant (QULIPTA) 60 MG TABS Take 1 tablet (60 mg total) by mouth daily. 30 tablet 11   atorvastatin (LIPITOR) 20 MG tablet Take 1 tablet (20 mg total) by mouth daily. 90 tablet 3   Blood Glucose Monitoring Suppl (FREESTYLE LITE) DEVI 1 each by Does not apply route 2 (two) times daily. E11.9 1 each 0   Blood Pressure  Monitoring (OMRON 3 SERIES BP MONITOR) DEVI Use as directed 1 each 0   butalbital-acetaminophen-caffeine (FIORICET) 50-325-40 MG tablet Take 1 tablet by mouth every 6 (six) hours as needed for headache. Do not refill in less than 30 day 30 tablet 5  Continuous Glucose Sensor (DEXCOM G7 SENSOR) MISC Apply 1 sensor every 10 days. 9 each 4   diazepam (VALIUM) 5 MG tablet Take 1 tablet (5 mg total) by mouth every 12 (twelve) hours as needed for anxiety. 30 tablet 5   dicyclomine (BENTYL) 20 MG tablet Take 1 tablet (20 mg total) by mouth every 6 (six) hours. 90 tablet 1   EPINEPHrine 0.3 mg/0.3 mL IJ SOAJ injection Inject 0.3 mg into the muscle as needed for anaphylaxis. 2 each 0   glucose blood (FREESTYLE LITE) test strip 1 each by Other route 2 (two) times daily. E11.9 200 each 0   Insulin Pen Needle (UNIFINE PENTIPS) 32G X 4 MM MISC Use 1-2x a day 200 each 4   Ipratropium-Albuterol (COMBIVENT) 20-100 MCG/ACT AERS respimat Inhale 1 puff into the lungs every 6 (six) hours. 4 g 11   Lancets (FREESTYLE) lancets 1 each by Other route 2 (two) times daily. E11.9 200 each 0   losartan-hydrochlorothiazide (HYZAAR) 50-12.5 MG tablet Take 1 tablet by mouth daily. 90 tablet 1   meloxicam (MOBIC) 15 MG tablet Take 1 tablet (15 mg total) by mouth daily. (Patient taking differently: Take 15 mg by mouth as needed.) 30 tablet 5   metFORMIN (GLUCOPHAGE-XR) 500 MG 24 hr tablet Take 1 tablet (500 mg total) by mouth daily with supper. 90 tablet 3   metoprolol succinate (TOPROL-XL) 25 MG 24 hr tablet Take 0.5 tablets (12.5 mg total) by mouth daily. 45 tablet 3   ondansetron (ZOFRAN) 8 MG tablet Take 1 tablet (8 mg total) by mouth every 8 (eight) hours as needed for nausea or vomiting. (Patient not taking: Reported on 01/02/2023) 20 tablet 0   ondansetron (ZOFRAN-ODT) 4 MG disintegrating tablet Take 1 tablet (4 mg total) by mouth every 8 (eight) hours as needed for nausea or vomiting. 20 tablet 0   Semaglutide, 1 MG/DOSE, 4  MG/3ML SOPN Inject 1 mg into the skin once a week as directed. 9 mL 1   tiZANidine (ZANAFLEX) 2 MG tablet Take 1 tablet (2 mg total) by mouth every 6 (six) hours as needed for muscle spasms. 20 tablet 0   Ubrogepant (UBRELVY) 100 MG TABS Take 1 tablet (100 mg total) by mouth as needed. May repeat a dose in 2 hours if needed. Max dose 2 pills in 2 hours 16 tablet 6   Vitamin D, Ergocalciferol, (DRISDOL) 1.25 MG (50000 UNIT) CAPS capsule Take 1 capsule by mouth once a week. 4 capsule 11   No current facility-administered medications on file prior to visit.   Allergies  Allergen Reactions   Bamlanivimab Anaphylaxis, Itching, Palpitations, Rash, Shortness Of Breath and Swelling   Botox [Onabotulinumtoxina] Shortness Of Breath    syncope   Clindamycin/Lincomycin Other (See Comments)   Kiwi Extract Shortness Of Breath   Maxalt [Rizatriptan Benzoate] Anaphylaxis    Chest pain   Nitrous Oxide Shortness Of Breath    syncope   Rizatriptan Benzoate Anaphylaxis    Chest pain   Triptans Shortness Of Breath   Vyepti [Eptinezumab-Jjmr] Shortness Of Breath, Palpitations and Other (See Comments)    Throat soreness, tachycardia   Aspirin Nausea And Vomiting    Mouth blisters   Contrast Media [Iodinated Contrast Media]    Erythromycin    Flagyl [Metronidazole]    Haemophilus Influenzae Vaccines    Latex     Sometimes causes rash   Mango Flavor    Vicodin [Hydrocodone-Acetaminophen]     hallucinations   Influenza Virus Vaccine Palpitations  Penicillins Nausea And Vomiting, Rash and Other (See Comments)    Did it involve swelling of the face/tongue/throat, SOB, or low BP? No Did it involve sudden or severe rash/hives, skin peeling, or any reaction on the inside of your mouth or nose? No Did you need to seek medical attention at a hospital or doctor's office? Yes When did it last happen?     patient was 52 years old  If all above answers are "NO", may proceed with cephalosporin use.    Tetracyclines & Related Nausea And Vomiting and Rash   Family History  Problem Relation Age of Onset   Diabetes Mother    Heart disease Mother 72   Hypertension Mother    Hyperlipidemia Mother    Heart failure Mother    Stroke Mother    Thyroid disease Mother    Depression Mother    Sleep apnea Mother    Obesity Mother    Colon polyps Mother    Cancer Father 18       Lung Cancer   Heart disease Father    Mental illness Sister        bipolar, substance abuse,  clean 2 yrs   Hypertension Sister    Cancer Paternal Grandmother 25       breast cancer   Breast cancer Paternal Grandmother    Diabetes Maternal Grandmother    Hypertension Maternal Grandmother    Diabetes Son    Colon cancer Neg Hx    Esophageal cancer Neg Hx    Rectal cancer Neg Hx    Stomach cancer Neg Hx    PE: BP 122/74   Pulse 76   Ht 5\' 6"  (1.676 m)   Wt 190 lb 12.8 oz (86.5 kg)   SpO2 98%   BMI 30.80 kg/m  Wt Readings from Last 3 Encounters:  01/17/23 190 lb 12.8 oz (86.5 kg)  01/02/23 180 lb (81.6 kg)  08/24/22 180 lb 8 oz (81.9 kg)   Constitutional: overweight, in NAD Eyes: no exophthalmos ENT: no masses palpated in neck, no cervical lymphadenopathy Cardiovascular: RRR, No MRG Respiratory: CTA B Musculoskeletal: no deformities Skin: no rashes Neurological: no tremor with outstretched hands  ASSESSMENT: 1. DM2, insulin-dependent, uncontrolled, with complications - CKD - mild - PN  2. HL  3.  Obesity class I  PLAN:  1. Patient with longstanding, previously uncontrolled type 2 diabetes, on weekly GLP-1 receptor agonist and low-dose metformin, with improved control at last visit, when HbA1c returned 6.3%, lower.  Of note, she had GI symptoms (nausea, vomiting, diarrhea) with higher doses of metformin she tolerated the lower dose well.  She was able to come off [place] while on the higher dose of Ozempic, 2 mg weekly, especially after losing 40 pounds.  At last visit, reviewing CGM trends,  sugars appears to be fluctuating within the target range and overall excellent.  We did not change her regimen.  However, she contacted me with hypoglycemia in 08/2022 and we reduced the dose of Ozempic to 1 mg weekly and she also takes 500 mg of metformin with dinner now. CGM interpretation: -At today's visit, we reviewed her CGM downloads: It appears that 95% of values are in target range (goal >70%), while 5% are higher than 180 (goal <25%), and 0% are lower than 70 (goal <4%).  The calculated average blood sugar is 134.  The projected HbA1c for the next 3 months (GMI) is 6.5%. -Reviewing the CGM trends, sugars appear to be fluctuating within the  target range with only occasional mildly hyperglycemic excursions.  Overall, there is very little variability in the blood sugars.  For now, we decided to continue the current regimen.  I refilled her prescriptions.  I did advise her that if she has more low blood sugars, to stop the metformin. - I suggested to:  Patient Instructions  Please continue: - Ozempic 1 mg weekly - Metformin 500 mg daily  Please return in 6 months.  - we checked her HbA1c: 5.9% (lower) - advised to check sugars at different times of the day - 4x a day, rotating check times - advised for yearly eye exams >> she is due - she will call and schedule this - return to clinic in 6 months  2. HL -Reviewed latest lipid panel from 06/2022: LDL slightly above our target of less than 70, HDL slightly low: Lab Results  Component Value Date   CHOL 125 07/01/2022   HDL 34 (L) 07/01/2022   LDLCALC 73 07/01/2022   LDLDIRECT 88.0 09/21/2021   TRIG 95 07/01/2022   CHOLHDL 3.7 07/01/2022  -She continues Lipitor 20 mg daily without side effects  3.  Obesity class I -She was able to lose almost 60 pounds after starting Ozempic, of which 7 before last visit -Last year we had to back off the Ozempic dose due to the shortage at the pharmacy but I advised her to increase the dose back to 2  mg when available.  She was able to start the 2 mg weekly but then decreased the dose to 1 mg weekly due to lows. -She gained 10 pounds since last visit but she tells me she is much happier with this weight and would like to maintain it.  Carlus Pavlov, MD PhD [facility] End[facility]

## 2023-01-17 NOTE — Addendum Note (Signed)
Addended by: Pollie Meyer on: 01/17/2023 04:43 PM   Modules accepted: Orders

## 2023-01-30 ENCOUNTER — Encounter (HOSPITAL_BASED_OUTPATIENT_CLINIC_OR_DEPARTMENT_OTHER): Payer: Self-pay | Admitting: Student

## 2023-01-30 ENCOUNTER — Ambulatory Visit (HOSPITAL_BASED_OUTPATIENT_CLINIC_OR_DEPARTMENT_OTHER): Payer: Commercial Managed Care - PPO

## 2023-01-30 ENCOUNTER — Other Ambulatory Visit (HOSPITAL_BASED_OUTPATIENT_CLINIC_OR_DEPARTMENT_OTHER): Payer: Self-pay

## 2023-01-30 ENCOUNTER — Ambulatory Visit (HOSPITAL_BASED_OUTPATIENT_CLINIC_OR_DEPARTMENT_OTHER): Payer: Commercial Managed Care - PPO | Admitting: Student

## 2023-01-30 DIAGNOSIS — M47812 Spondylosis without myelopathy or radiculopathy, cervical region: Secondary | ICD-10-CM | POA: Diagnosis not present

## 2023-01-30 DIAGNOSIS — M25511 Pain in right shoulder: Secondary | ICD-10-CM | POA: Diagnosis not present

## 2023-01-30 DIAGNOSIS — M542 Cervicalgia: Secondary | ICD-10-CM

## 2023-01-30 MED ORDER — METHOCARBAMOL 500 MG PO TABS
500.0000 mg | ORAL_TABLET | Freq: Four times a day (QID) | ORAL | 0 refills | Status: AC | PRN
  Filled 2023-01-30: qty 56, 14d supply, fill #0

## 2023-01-31 NOTE — Progress Notes (Signed)
Chief Complaint: Right shoulder pain     History of Present Illness:    [name] is a 52 y.o. female presenting today for evaluation of right shoulder pain.  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  When performing a quick extension motion, she felt immediate pain in the shoulder.  She now reports decreased range of motion of the shoulder, as well as pain and stiffness in her neck.  She does feel like her neck is spasming.  Also has tingling in her right hand, particularly her fourth and fifth fingers.  She does have history of a rotator cuff repair of the right shoulder in 2018, followed by a reinjury later that year.  Dr. Steward Drone treated her last year for adhesive capsulitis as well as likely rotator cuff tear.  She is diabetic and cortisone injection performed last year of the shoulder shot her blood sugar up into the 600s.  Does report that she has also previously gone to physical therapy with little relief.  She takes meloxicam 15 mg as needed, tizanidine or baclofen for muscle spasms, as well as Valium as needed.   Surgical History:   Right rotator cuff repair - 2018  PMH/PSH/Family History/Social History/Meds/Allergies:    Past Medical History:  Diagnosis Date   Allergy    Anemia    Anxiety    claustrophobic   Asthma    Back pain    Biceps tendonosis of right shoulder    Cataract    Mixed OU   COVID-19    covid PNA hospitalized 2020, 06/06/21   Diabetes mellitus 2011   did not start metforfin, losing weight   Dyspnea    Fatty liver    Food allergy    Frozen shoulder    left, had steroid injection 10/05/21   Headache disorder    History of concussion    HLD (hyperlipidemia)    Hypertension    Hypertensive retinopathy    OU   IBS (irritable bowel syndrome)    Infertility, female    Joint pain    Lactose intolerance    Leg edema     Migraines    Palpitation    Post-menopausal    Seborrheic dermatitis    Shoulder impingement syndrome, right    Uveitis    [facility] eye est in 2020 seen specialist in [place] [facility] last seen 11 or 05/2020   Vitamin D deficiency    Past Surgical History:  Procedure Laterality Date   ABDOMINAL HYSTERECTOMY  2006   heavy menses, endometriosis, l oophrectomy   BACK SURGERY     BREAST BIOPSY Right 2018   benign   BREAST EXCISIONAL BIOPSY Right 2003   benign   BREAST EXCISIONAL BIOPSY Right 1999   benign   BREAST SURGERY     right breast x 2 , benign   COLONOSCOPY  10/06/2020   HERNIA REPAIR  2003   left inguinal    LEFT OOPHORECTOMY     SHOULDER ARTHROSCOPY WITH SUBACROMIAL DECOMPRESSION AND OPEN ROTATOR C Right 07/14/2016   Procedure: RIGHT SHOULDER ARTHROSCOPY WITH SUBACROMIAL DECOMPRESSION, DISTAL CLAVICLE RESECTION AND MINI OPEN ROTATOR CUFF REPAIR, OPEN BICEP TENDODESIS;  Surgeon: Valeria Batman, MD;  Location: [facility];  Service: Orthopedics;  Laterality: Right;   SHOULDER CLOSED REDUCTION Right 09/08/2016   Procedure: RIGHT CLOSED MANIPULATION SHOULDER;  Surgeon: Valeria Batman, MD;  Location: [facility];  Service: Orthopedics;  Laterality: Right;   TUBAL LIGATION     UPPER GASTROINTESTINAL ENDOSCOPY  10/06/2020   Social History   Socioeconomic History   Marital status: Divorced    Spouse name: Not on file   Number of children: 2   Years of education: 14   Highest education level: Master's degree (e.g., MA, MS, MEng, MEd, MSW, MBA)  Occupational History   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   Right-handed.   1-3 cups caffeine weekly.   Social Determinants of Health   Financial Resource Strain: Not on file  Food Insecurity: Not on file  Transportation Needs: Not on file  Physical Activity: Not on file  Stress: Not on file  Social Connections: Not on file   Family History  Problem Relation Age of Onset   Diabetes Mother    Heart disease Mother 79   Hypertension Mother    Hyperlipidemia Mother    Heart failure Mother    Stroke Mother    Thyroid disease Mother    Depression Mother    Sleep apnea Mother    Obesity Mother    Colon polyps Mother    Cancer Father 7       Lung Cancer   Heart disease Father    Mental illness Sister        bipolar, substance abuse,  clean 2 yrs   Hypertension Sister    Cancer Paternal Grandmother 6       breast cancer   Breast cancer Paternal Grandmother    Diabetes Maternal Grandmother    Hypertension Maternal Grandmother    Diabetes Son    Colon cancer Neg Hx    Esophageal cancer Neg Hx    Rectal cancer Neg Hx    Stomach cancer Neg Hx    Allergies  Allergen Reactions   Bamlanivimab Anaphylaxis, Itching, Palpitations, Rash, Shortness Of Breath and Swelling   Botox [Onabotulinumtoxina] Shortness Of Breath    syncope   Clindamycin/Lincomycin Other (See Comments)   Kiwi Extract Shortness Of Breath   Maxalt [Rizatriptan Benzoate] Anaphylaxis    Chest pain   Nitrous Oxide Shortness Of Breath    syncope   Rizatriptan Benzoate Anaphylaxis    Chest pain   Triptans Shortness Of Breath   Vyepti [Eptinezumab-Jjmr] Shortness Of Breath, Palpitations and Other (See Comments)    Throat soreness, tachycardia   Aspirin Nausea And Vomiting    Mouth blisters   Contrast Media [Iodinated Contrast Media]    Erythromycin    Flagyl [Metronidazole]    Haemophilus Influenzae Vaccines    Latex     Sometimes causes rash   Mango Flavor    Vicodin [Hydrocodone-Acetaminophen]     hallucinations    Influenza Virus Vaccine Palpitations   Penicillins Nausea And Vomiting, Rash and Other (See Comments)    Did it  involve swelling of the face/tongue/throat, SOB, or low BP? No Did it involve sudden or severe rash/hives, skin peeling, or any reaction on the inside of your mouth or nose? No Did you need to seek medical attention at a hospital or doctor's office? Yes When did it last happen?     patient was 52 years old  If all above answers are "NO", may proceed with cephalosporin use.   Tetracyclines & Related Nausea And Vomiting and Rash   Current Outpatient Medications  Medication Sig Dispense Refill   methocarbamol (ROBAXIN) 500 MG tablet Take 1 tablet (500 mg total) by mouth every 6 (six) hours as needed for up to 14 days for muscle spasms. 56 tablet 0   albuterol (VENTOLIN HFA) 108 (90 Base) MCG/ACT inhaler Inhale 2 puffs into the lungs every 6 (six) hours as needed for wheezing or shortness of breath. 18 g 0   aspirin EC 81 MG tablet Take 1 tablet (81 mg total) by mouth daily. 30 tablet 1   Atogepant (QULIPTA) 60 MG TABS Take 1 tablet (60 mg total) by mouth daily. 30 tablet 11   atorvastatin (LIPITOR) 20 MG tablet Take 1 tablet (20 mg total) by mouth daily. 90 tablet 3   Blood Glucose Monitoring Suppl (FREESTYLE LITE) DEVI 1 each by Does not apply route 2 (two) times daily. E11.9 1 each 0   Blood Pressure Monitoring (OMRON 3 SERIES BP MONITOR) DEVI Use as directed 1 each 0   butalbital-acetaminophen-caffeine (FIORICET) 50-325-40 MG tablet Take 1 tablet by mouth every 6 (six) hours as needed for headache. Do not refill in less than 30 day 30 tablet 5   Continuous Glucose Sensor (DEXCOM G7 SENSOR) MISC Apply 1 sensor every 10 days. 9 each 4   diazepam (VALIUM) 5 MG tablet Take 1 tablet (5 mg total) by mouth every 12 (twelve) hours as needed for anxiety. 30 tablet 5   dicyclomine (BENTYL) 20 MG tablet Take 1 tablet (20 mg total) by mouth every 6 (six) hours. 90 tablet 1   EPINEPHrine 0.3 mg/0.3  mL IJ SOAJ injection Inject 0.3 mg into the muscle as needed for anaphylaxis. 2 each 0   glucose blood (FREESTYLE LITE) test strip 1 each by Other route 2 (two) times daily. E11.9 200 each 0   Insulin Pen Needle (UNIFINE PENTIPS) 32G X 4 MM MISC Use 1-2x a day 200 each 4   Ipratropium-Albuterol (COMBIVENT) 20-100 MCG/ACT AERS respimat Inhale 1 puff into the lungs every 6 (six) hours. 4 g 11   Lancets (FREESTYLE) lancets 1 each by Other route 2 (two) times daily. E11.9 200 each 0   losartan-hydrochlorothiazide (HYZAAR) 50-12.5 MG tablet Take 1 tablet by mouth daily. 90 tablet 1   meloxicam (MOBIC) 15 MG tablet Take 1 tablet (15 mg total) by mouth daily. (Patient taking differently: Take 15 mg by mouth as needed.) 30 tablet 5   metFORMIN (GLUCOPHAGE-XR) 500 MG 24 hr tablet Take 1 tablet (500 mg total) by mouth daily with supper.     metoprolol succinate (TOPROL-XL) 25 MG 24 hr tablet Take 0.5 tablets (12.5 mg total) by mouth daily. 45 tablet 3   ondansetron (ZOFRAN) 8 MG tablet Take 1 tablet (8 mg total) by mouth every 8 (eight) hours as needed for nausea or vomiting. 20 tablet 0   ondansetron (ZOFRAN-ODT) 4 MG disintegrating tablet Take 1 tablet (4 mg total) by mouth every 8 (eight) hours as needed for nausea or vomiting. 20 tablet 0   Semaglutide,  1 MG/DOSE, 4 MG/3ML SOPN Inject 1 mg into the skin once a week as directed. 9 mL 1   tiZANidine (ZANAFLEX) 2 MG tablet Take 1 tablet (2 mg total) by mouth every 6 (six) hours as needed for muscle spasms. 20 tablet 0   Ubrogepant (UBRELVY) 100 MG TABS Take 1 tablet (100 mg total) by mouth as needed. May repeat a dose in 2 hours if needed. Max dose 2 pills in 2 hours 16 tablet 6   Vitamin D, Ergocalciferol, (DRISDOL) 1.25 MG (50000 UNIT) CAPS capsule Take 1 capsule by mouth once a week. 4 capsule 11   No current facility-administered medications for this visit.   DG Cervical Spine Complete  Result Date: 01/30/2023 CLINICAL DATA:  Neck and right shoulder  pain. EXAM: CERVICAL SPINE - COMPLETE 4+ VIEW COMPARISON:  August 09, 2013 FINDINGS: There is no evidence of cervical spine fracture or prevertebral soft tissue swelling. Alignment is normal. Minimal anterior spurring noted as C2, C3 and C4. Minimal narrow left C4-5 neural foramina with osteophyte encroachment noted. No other significant bone abnormalities are identified. IMPRESSION: Minimal degenerative joint changes of cervical spine. Electronically Signed   By: Sherian Rein M.D.   On: 01/30/2023 16:31    Review of Systems:   A ROS was performed including pertinent positives and negatives as documented in the HPI.  Physical Exam :   Constitutional: NAD and appears stated age Neurological: Alert and oriented Psych: Appropriate affect and cooperative There were no vitals taken for this visit.   Comprehensive Musculoskeletal Exam:    Musculoskeletal Exam    Inspection Right Left  Skin No atrophy or winging No atrophy or winging  Palpation    Tenderness Right upper trapezius and anterior humeral joint None  Range of Motion    Flexion (passive) 45 with pain 170  Flexion (active) 45 with pain 170  ER at the side 10 45  Can reach behind back to Front pocket T6  Strength     Limited 5/5  Special Tests    Pseudoparalytic No No  Neurologic    Fires PIN, radial, median, ulnar, musculocutaneous, axillary, suprascapular, long thoracic, and spinal accessory innervated muscles.  Decreased sensation to fourth and fifth fingers of right hand.  Vascular/Lymphatic    Radial Pulse 2+ 2+  Cervical Exam    Patient has decreased cervical range of motion with flexion, extension, and bilateral rotation.  Special Test: Unable to due to limited ROM     Imaging:   Xray (cervical spine 4+ views): Minimal degenerative changes throughout.  No acute abnormality.  Xray (right shoulder 3 views): Negative   I personally reviewed and interpreted the radiographs.   Assessment:   52 y.o. female with acute  on chronic right shoulder pain.  Patient and her presentation, I do believe that they are likely multiple pathologies present.  Due to the acute nature of her pain, I would suspect more likely involvement of the rotator cuff as opposed to adhesive capsulitis, although her active and passive range of motion are significantly decreased.  There is however significant tightness and spasming in the right upper trapezius extending up into the neck.  Radicular symptoms are present, so could consider further workup to evaluate for cervical radiculopathy.  Nerve conduction study performed in 2019 was negative, however this could have progressed.  At this point I will plan to have her continue taking the meloxicam daily and we will start her on Robaxin as well.  Did discuss that we  could consider physical therapy, however I would like to get her acute symptoms under control.  Recommend close follow-up with Dr. Steward Drone for further evaluation and workup.  Plan :    - Start Robaxin 500 mg as needed - Schedule close follow up with Dr. Steward Drone for further treatment discussion     I personally saw and evaluated the patient, and participated in the management and treatment plan.  Hazle Nordmann, PA-C Orthopedics  This document was dictated using Conservation officer, historic buildings. A reasonable attempt at proof reading has been made to minimize errors.

## 2023-02-02 ENCOUNTER — Other Ambulatory Visit (HOSPITAL_COMMUNITY): Payer: Self-pay

## 2023-02-03 ENCOUNTER — Ambulatory Visit (INDEPENDENT_AMBULATORY_CARE_PROVIDER_SITE_OTHER): Payer: Commercial Managed Care - PPO

## 2023-02-03 ENCOUNTER — Encounter (HOSPITAL_BASED_OUTPATIENT_CLINIC_OR_DEPARTMENT_OTHER): Payer: Self-pay

## 2023-02-03 DIAGNOSIS — M25511 Pain in right shoulder: Secondary | ICD-10-CM | POA: Diagnosis not present

## 2023-02-03 NOTE — Telephone Encounter (Signed)
FYI I am going to tell her this is ok to pick up at the [address] office. Ill put in for nurse to ask for triage

## 2023-02-03 NOTE — Telephone Encounter (Signed)
Patient picked up sling

## 2023-02-13 ENCOUNTER — Encounter: Payer: Self-pay | Admitting: Internal Medicine

## 2023-02-13 ENCOUNTER — Other Ambulatory Visit (HOSPITAL_COMMUNITY): Payer: Self-pay

## 2023-02-15 ENCOUNTER — Ambulatory Visit (HOSPITAL_BASED_OUTPATIENT_CLINIC_OR_DEPARTMENT_OTHER): Payer: Self-pay | Admitting: Orthopaedic Surgery

## 2023-02-15 ENCOUNTER — Ambulatory Visit (INDEPENDENT_AMBULATORY_CARE_PROVIDER_SITE_OTHER): Payer: Commercial Managed Care - PPO | Admitting: Orthopaedic Surgery

## 2023-02-15 ENCOUNTER — Other Ambulatory Visit (HOSPITAL_BASED_OUTPATIENT_CLINIC_OR_DEPARTMENT_OTHER): Payer: Self-pay

## 2023-02-15 DIAGNOSIS — S46011A Strain of muscle(s) and tendon(s) of the rotator cuff of right shoulder, initial encounter: Secondary | ICD-10-CM

## 2023-02-15 MED ORDER — ACETAMINOPHEN 500 MG PO TABS
500.0000 mg | ORAL_TABLET | Freq: Three times a day (TID) | ORAL | 0 refills | Status: AC
  Filled 2023-02-15: qty 30, 10d supply, fill #0

## 2023-02-15 MED ORDER — IBUPROFEN 800 MG PO TABS
800.0000 mg | ORAL_TABLET | Freq: Three times a day (TID) | ORAL | 0 refills | Status: AC
  Filled 2023-02-15: qty 30, 10d supply, fill #0

## 2023-02-15 MED ORDER — ASPIRIN 325 MG PO TBEC
325.0000 mg | DELAYED_RELEASE_TABLET | Freq: Every day | ORAL | 0 refills | Status: DC
  Filled 2023-02-15 – 2023-03-27 (×2): qty 14, 14d supply, fill #0

## 2023-02-15 MED ORDER — OXYCODONE HCL 5 MG PO TABS
5.0000 mg | ORAL_TABLET | ORAL | 0 refills | Status: DC | PRN
  Filled 2023-02-15 – 2023-03-01 (×2): qty 20, 4d supply, fill #0

## 2023-02-15 NOTE — Progress Notes (Signed)
Chief Complaint: Bilateral shoulder pain     History of Present Illness:   02/15/2023: For follow-up of the left as well as right shoulder.  Presents today for follow-up predominantly for the right shoulder.  She is experiencing extremely limited motion and any type of overhead activity.   [name] is a 52 y.o. female right-hand-dominant presents with left shoulder pain that has been ongoing for 6 months now.  She has no known injury.  She does have a history of diabetes.  She states that she had previously had right shoulder rotator cuff repair and has subsequently used her left arm in order to compensate.  She is scheduled for trip to [place] the upcoming day to go see her daughter's cheer competition.  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  She is status post right shoulder rotator cuff repair done with Dr. Cleophas Dunker in 2018    Surgical History:   Right shoulder rotator cuff repair done in 2018  PMH/PSH/Family History/Social History/Meds/Allergies:    Past Medical History:  Diagnosis Date   Allergy    Anemia    Anxiety    claustrophobic   Asthma    Back pain    Biceps tendonosis of right shoulder    Cataract    Mixed OU   COVID-19    covid PNA hospitalized 2020, 06/06/21   Diabetes mellitus 2011   did not start metforfin, losing weight   Dyspnea    Fatty liver    Food allergy    Frozen shoulder    left, had steroid injection 10/05/21   Headache disorder    History of concussion    HLD (hyperlipidemia)    Hypertension    Hypertensive retinopathy    OU   IBS (irritable bowel syndrome)    Infertility, female    Joint pain    Lactose intolerance    Leg edema    Migraines    Palpitation    Post-menopausal    Seborrheic dermatitis    Shoulder impingement syndrome, right    Uveitis    [facility] [facility] in 2020 seen specialist in [place] [facility] last seen 11 or 05/2020   Vitamin D deficiency    Past Surgical  History:  Procedure Laterality Date   ABDOMINAL HYSTERECTOMY  2006   heavy menses, endometriosis, l oophrectomy   BACK SURGERY     BREAST BIOPSY Right 2018   benign   BREAST EXCISIONAL BIOPSY Right 2003   benign   BREAST EXCISIONAL BIOPSY Right 1999   benign   BREAST SURGERY     right breast x 2 , benign   COLONOSCOPY  10/06/2020   HERNIA REPAIR  2003   left inguinal    LEFT OOPHORECTOMY     SHOULDER ARTHROSCOPY WITH SUBACROMIAL DECOMPRESSION AND OPEN ROTATOR C Right 07/14/2016   Procedure: RIGHT SHOULDER ARTHROSCOPY WITH SUBACROMIAL DECOMPRESSION, DISTAL CLAVICLE RESECTION AND MINI OPEN ROTATOR CUFF REPAIR, OPEN BICEP TENDODESIS;  Surgeon: Valeria Batman, MD;  Location: [facility];  Service: Orthopedics;  Laterality: Right;   SHOULDER CLOSED REDUCTION Right 09/08/2016   Procedure: RIGHT CLOSED MANIPULATION SHOULDER;  Surgeon: Valeria Batman, MD;  Location: [facility];  Service: Orthopedics;  Laterality: Right;   TUBAL LIGATION     UPPER  GASTROINTESTINAL ENDOSCOPY  10/06/2020   Social History   Socioeconomic History   Marital status: Divorced    Spouse name: Not on file   Number of children: 2   Years of education: 14   Highest education level: Master's degree (e.g., MA, MS, MEng, MEd, MSW, MBA)  Occupational History   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   Right-handed.   1-3 cups caffeine weekly.   Social Determinants of Health   Financial Resource Strain: Not on file  Food Insecurity: Not on file  Transportation Needs: Not on file  Physical Activity: Not on  file  Stress: Not on file  Social Connections: Not on file   Family History  Problem Relation Age of Onset   Diabetes Mother    Heart disease Mother 81   Hypertension Mother    Hyperlipidemia Mother    Heart failure Mother    Stroke Mother    Thyroid disease Mother    Depression Mother    Sleep apnea Mother    Obesity Mother    Colon polyps Mother    Cancer Father 18       Lung Cancer   Heart disease Father    Mental illness Sister        bipolar, substance abuse,  clean 2 yrs   Hypertension Sister    Cancer Paternal Grandmother 44       breast cancer   Breast cancer Paternal Grandmother    Diabetes Maternal Grandmother    Hypertension Maternal Grandmother    Diabetes Son    Colon cancer Neg Hx    Esophageal cancer Neg Hx    Rectal cancer Neg Hx    Stomach cancer Neg Hx    Allergies  Allergen Reactions   Bamlanivimab Anaphylaxis, Itching, Palpitations, Rash, Shortness Of Breath and Swelling   Botox [Onabotulinumtoxina] Shortness Of Breath    syncope   Clindamycin/Lincomycin Other (See Comments)   Kiwi Extract Shortness Of Breath   Maxalt [Rizatriptan Benzoate] Anaphylaxis    Chest pain   Nitrous Oxide Shortness Of Breath    syncope   Rizatriptan Benzoate Anaphylaxis    Chest pain   Triptans Shortness Of Breath   Vyepti [Eptinezumab-Jjmr] Shortness Of Breath, Palpitations and Other (See Comments)    Throat soreness, tachycardia   Aspirin Nausea And Vomiting    Mouth blisters   Contrast Media [Iodinated Contrast Media]    Erythromycin    Flagyl [Metronidazole]    Haemophilus Influenzae Vaccines    Latex     Sometimes causes rash   Mango Flavor    Vicodin [Hydrocodone-Acetaminophen]     hallucinations   Influenza Virus Vaccine Palpitations   Penicillins Nausea And Vomiting, Rash and Other (See Comments)    Did it involve swelling of the face/tongue/throat, SOB, or low BP? No Did it involve sudden or severe rash/hives, skin peeling, or any reaction on the  inside of your mouth or nose? No Did you need to seek medical attention at a hospital or doctor's office? Yes When  did it last happen?     patient was 52 years old  If all above answers are "NO", may proceed with cephalosporin use.   Tetracyclines & Related Nausea And Vomiting and Rash   Current Outpatient Medications  Medication Sig Dispense Refill   acetaminophen (TYLENOL) 500 MG tablet Take 1 tablet (500 mg total) by mouth every 8 (eight) hours for 10 days. 30 tablet 0   aspirin EC 325 MG tablet Take 1 tablet (325 mg total) by mouth daily. 14 tablet 0   ibuprofen (ADVIL) 800 MG tablet Take 1 tablet (800 mg total) by mouth every 8 (eight) hours for 10 days. Please take with food, please alternate with acetaminophen 30 tablet 0   oxyCODONE (ROXICODONE) 5 MG immediate release tablet Take 1 tablet (5 mg total) by mouth every 4 (four) hours as needed for severe pain or breakthrough pain. 20 tablet 0   albuterol (VENTOLIN HFA) 108 (90 Base) MCG/ACT inhaler Inhale 2 puffs into the lungs every 6 (six) hours as needed for wheezing or shortness of breath. 18 g 0   aspirin EC 81 MG tablet Take 1 tablet (81 mg total) by mouth daily. 30 tablet 1   Atogepant (QULIPTA) 60 MG TABS Take 1 tablet (60 mg total) by mouth daily. 30 tablet 11   atorvastatin (LIPITOR) 20 MG tablet Take 1 tablet (20 mg total) by mouth daily. 90 tablet 3   Blood Glucose Monitoring Suppl (FREESTYLE LITE) DEVI 1 each by Does not apply route 2 (two) times daily. E11.9 1 each 0   Blood Pressure Monitoring (OMRON 3 SERIES BP MONITOR) DEVI Use as directed 1 each 0   butalbital-acetaminophen-caffeine (FIORICET) 50-325-40 MG tablet Take 1 tablet by mouth every 6 (six) hours as needed for headache. Do not refill in less than 30 day 30 tablet 5   Continuous Glucose Sensor (DEXCOM G7 SENSOR) MISC Apply 1 sensor every 10 days. 9 each 4   diazepam (VALIUM) 5 MG tablet Take 1 tablet (5 mg total) by mouth every 12 (twelve) hours as needed for  anxiety. 30 tablet 5   dicyclomine (BENTYL) 20 MG tablet Take 1 tablet (20 mg total) by mouth every 6 (six) hours. 90 tablet 1   EPINEPHrine 0.3 mg/0.3 mL IJ SOAJ injection Inject 0.3 mg into the muscle as needed for anaphylaxis. 2 each 0   glucose blood (FREESTYLE LITE) test strip 1 each by Other route 2 (two) times daily. E11.9 200 each 0   Insulin Pen Needle (UNIFINE PENTIPS) 32G X 4 MM MISC Use 1-2x a day 200 each 4   Ipratropium-Albuterol (COMBIVENT) 20-100 MCG/ACT AERS respimat Inhale 1 puff into the lungs every 6 (six) hours. 4 g 11   Lancets (FREESTYLE) lancets 1 each by Other route 2 (two) times daily. E11.9 200 each 0   losartan-hydrochlorothiazide (HYZAAR) 50-12.5 MG tablet Take 1 tablet by mouth daily. 90 tablet 1   meloxicam (MOBIC) 15 MG tablet Take 1 tablet (15 mg total) by mouth daily. (Patient taking differently: Take 15 mg by mouth as needed.) 30 tablet 5   metFORMIN (GLUCOPHAGE-XR) 500 MG 24 hr tablet Take 1 tablet (500 mg total) by mouth daily with supper.     metoprolol succinate (TOPROL-XL) 25 MG 24 hr tablet Take 0.5 tablets (12.5 mg total) by mouth daily. 45 tablet 3   ondansetron (ZOFRAN) 8 MG tablet Take 1 tablet (8 mg total) by mouth every 8 (eight) hours as needed for nausea or vomiting. 20 tablet 0  ondansetron (ZOFRAN-ODT) 4 MG disintegrating tablet Take 1 tablet (4 mg total) by mouth every 8 (eight) hours as needed for nausea or vomiting. 20 tablet 0   Semaglutide, 1 MG/DOSE, 4 MG/3ML SOPN Inject 1 mg into the skin once a week as directed. 9 mL 1   tiZANidine (ZANAFLEX) 2 MG tablet Take 1 tablet (2 mg total) by mouth every 6 (six) hours as needed for muscle spasms. 20 tablet 0   Ubrogepant (UBRELVY) 100 MG TABS Take 1 tablet (100 mg total) by mouth as needed. May repeat a dose in 2 hours if needed. Max dose 2 pills in 2 hours 16 tablet 6   Vitamin D, Ergocalciferol, (DRISDOL) 1.25 MG (50000 UNIT) CAPS capsule Take 1 capsule by mouth once a week. 4 capsule 11   No  current facility-administered medications for this visit.   No results found.  Review of Systems:   A ROS was performed including pertinent positives and negatives as documented in the HPI.  Physical Exam :   Constitutional: NAD and appears stated age Neurological: Alert and oriented Psych: Appropriate affect and cooperative There were no vitals taken for this visit.   Comprehensive Musculoskeletal Exam:    Musculoskeletal Exam    Inspection Right Left  Skin No atrophy or winging No atrophy or winging  Palpation    Tenderness Lateral deltoid None  Range of Motion    Flexion (passive) 50 170  Flexion (active) 50 160  Abduction 40 160  ER at the side 30 70  Can reach behind back to Side pocket T12  Strength     4/5 5/5  Special Tests    Pseudoparalytic No No  Neurologic    Fires PIN, radial, median, ulnar, musculocutaneous, axillary, suprascapular, long thoracic, and spinal accessory innervated muscles. No abnormal sensibility  Vascular/Lymphatic    Radial Pulse 2+ 2+  Cervical Exam    Patient has symmetric cervical range of motion with negative Spurling's test.  Special Test: Positive Neer impingement on the right     Imaging:   Xray (views left shoulder): Normal  MRI right shoulder: Intact rotator cuff repair although this is quite thin.  There is significant fluid around the biceps tendon.  There is thickening of the inferior capsule as well as obliteration of the space.  I personally reviewed and interpreted the radiographs.   Assessment:   52 y.o. female right-hand-dominant presents with left shoulder pain consistent with adhesive capsulitis that is now resolved.  In terms of her right shoulder as stated that there is evidence of possible retearing with an overall tendon repair.  I believe that this is resulting in her lack of limited overhead motion.  This is somewhat difficult to tease apart from possible adhesive capsulitis of the right shoulder.   Unfortunately she did not tolerate her last steroid injection and this fluctuated her blood sugars quite unreasonably.  To this extent I do not want to perform an additional steroid injection.  This time I do believe that unfortunately given her significant loss of motion that she would benefit from right shoulder arthroscopy with lysis of adhesions, manipulation under anesthesia as well as a collagen patch augmentation.  I discussed the risk and limitations associated with this.  After discussion she has elected for this  Plan :    -Plan for right shoulder arthroscopy with lysis of adhesions, manipulation under anesthesia, collagen patch augmentation   After a lengthy discussion of treatment options, including risks, benefits, alternatives, complications of surgical and  nonsurgical conservative options, the patient elected surgical repair.   The patient  is aware of the material risks  and complications including, but not limited to injury to adjacent structures, neurovascular injury, infection, numbness, bleeding, implant failure, thermal burns, stiffness, persistent pain, failure to heal, disease transmission from allograft, need for further surgery, dislocation, anesthetic risks, blood clots, risks of death,and others. The probabilities of surgical success and failure discussed with patient given their particular co-morbidities.The time and nature of expected rehabilitation and recovery was discussed.The patient's questions were all answered preoperatively.  No barriers to understanding were noted. I explained the natural history of the disease process and Rx rationale.  I explained to the patient what I considered to be reasonable expectations given their personal situation.  The final treatment plan was arrived at through a shared patient decision making process model.     I personally saw and evaluated the patient, and participated in the management and treatment plan.  Huel Cote,  MD Attending Physician, Orthopedic Surgery  This document was dictated using Dragon voice recognition software. A reasonable attempt at proof reading has been made to minimize errors.

## 2023-02-19 ENCOUNTER — Encounter (HOSPITAL_BASED_OUTPATIENT_CLINIC_OR_DEPARTMENT_OTHER): Payer: Self-pay | Admitting: Orthopaedic Surgery

## 2023-02-22 NOTE — Telephone Encounter (Signed)
I spoke with the patient, and scheduled her surgery. 02/21/23.

## 2023-02-27 ENCOUNTER — Encounter: Payer: Self-pay | Admitting: Internal Medicine

## 2023-02-27 ENCOUNTER — Other Ambulatory Visit (HOSPITAL_BASED_OUTPATIENT_CLINIC_OR_DEPARTMENT_OTHER): Payer: Self-pay

## 2023-02-27 NOTE — Telephone Encounter (Signed)
Pt called about mychart message

## 2023-02-28 ENCOUNTER — Telehealth: Payer: Self-pay

## 2023-02-28 NOTE — Telephone Encounter (Signed)
Pt is calling stating she would like an update. Pt arm and shoulder is killing her and she would like to get her surgery scheduled so she can get some relief

## 2023-02-28 NOTE — Telephone Encounter (Signed)
Received form from [employer] on 9/24. Reached out to patient regarding form fee. Form fee paid and placed in billing mailbox. Form given to POD 2 for completion

## 2023-02-28 NOTE — Telephone Encounter (Signed)
How long before surgery does pt need to stop the Aspirin 81 mg?

## 2023-02-28 NOTE — Telephone Encounter (Signed)
Forms signed and completed, placed in MR for pick up

## 2023-03-01 ENCOUNTER — Other Ambulatory Visit (HOSPITAL_COMMUNITY): Payer: Self-pay

## 2023-03-01 ENCOUNTER — Other Ambulatory Visit: Payer: Self-pay | Admitting: Internal Medicine

## 2023-03-01 DIAGNOSIS — Z0289 Encounter for other administrative examinations: Secondary | ICD-10-CM

## 2023-03-01 MED ORDER — METOPROLOL SUCCINATE ER 25 MG PO TB24
12.5000 mg | ORAL_TABLET | Freq: Every day | ORAL | 0 refills | Status: DC
  Filled 2023-03-01: qty 45, 90d supply, fill #0

## 2023-03-02 NOTE — Telephone Encounter (Signed)
Form faxed and confirmation received. Placed in MR for filing. Called patient and informed her that faxed has been sent - requested a copy be mailed to her. Copy in outgoing mail.

## 2023-03-06 ENCOUNTER — Other Ambulatory Visit (HOSPITAL_COMMUNITY): Payer: Self-pay

## 2023-03-07 NOTE — Progress Notes (Unsigned)
Virtual Visit via Video Note  I connected with [name] on 03/07/23 at  2:45 PM EDT by a video enabled telemedicine application and verified that I am speaking with the correct person using two identifiers.  Location: Patient: in her car  Provider: in the office    I discussed the limitations of evaluation and management by telemedicine and the availability of in person appointments. The patient expressed understanding and agreed to proceed.  History of Present Illness: 03/08/23 SS: Remains on Qulipta 60 mg daily, uses Ubrelvy PRN. Migraines are less frequent. [name] is the most effective she has ever had. On average 3-4 migraines a month. [name] makes her nauseated, takes Zofran. Sometimes has to take 2 Ubrelvy. Last week had to miss 2 days of work. Going to have shoulder surgery 10/31. A1C better at 5.9. sometimes is late getting [name] due to insurance issue. PCP has sent in Fioricet, she rarely uses.   08/24/22 SS: When last seen 02/22/2022 headaches were improved with Qulipta.  [name] was given along with chlorzoxazone. No longer has debilitating headaches. Has only missed 2 days of work since September. Has quiet room at staff education. [name] has been very helpful. Started new job on 08/15/22, has had headache for about 2 weeks, not severe migraine. [name] works well, last week had to take 2 tablets, was 1st time had to take 2 in the same day. Has the same box originally prescribed. Has massage on Friday, deep tissue massage.    02/22/22 Dr. Delena Bali Interval History: Headaches have improved in frequency and severity since starting Qulipta. She has had about 9 headaches in the past month. Takes Excedrin as needed but has been trying not to take anything because she is concerned about rebound headaches. She continues to have significant neck tension and tightness. She is going to PT for her shoulder but doesn't feel this has been particularly helpful.   Headache days per month:  9 Headache free days per month: 21   Current Headache Regimen: Preventative: Qulipta 60 mg daily Abortive: Excedrin     Prior Therapies                                  Rescue: Baclofen Flexeril Robaxin Fioricet Maxalt - anaphylaxis Nurtec   Preventive: Ajovy Emgality Vyepti - chest pain Botox - bradycardia, chest pain, shortness of breath Topamax - eye pressure Zonisamide Effexor - diarrhea Nortriptyline Verapamil Propranolol Nerve block   Observations/Objective: Via video visit, is alert and oriented, speech is clear and concise, moves about freely  Assessment and Plan: 1.  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 Consider diclofenac for rescue  -[name] can be new neurologist since Chima is no longer here  Meds ordered this encounter  Medications   Ubrogepant (UBRELVY) 100 MG TABS    Sig: Take 1 tablet (100 mg total) by mouth as needed. May repeat a dose in 2 hours if needed. Max dose 2 pills in 2 hours    Dispense:  16 tablet    Refill:  11   Atogepant (QULIPTA) 60 MG TABS    Sig: Take 1 tablet (60 mg total) by mouth daily.    Dispense:  30 tablet    Refill:  11  Follow Up Instructions: 1 year MyChart video visit   I discussed the assessment and treatment plan with the patient. The patient was provided an opportunity to ask questions and all were answered. The patient agreed with the plan and demonstrated an understanding of the instructions.   The patient was advised to call back or seek an in-person evaluation if the symptoms worsen or if the condition fails to improve as anticipated.   Otila Kluver, DNP  [facility] [address] [place], [place] [address] [phone]

## 2023-03-08 ENCOUNTER — Telehealth: Payer: Commercial Managed Care - PPO | Admitting: Neurology

## 2023-03-08 ENCOUNTER — Other Ambulatory Visit (HOSPITAL_COMMUNITY): Payer: Self-pay

## 2023-03-08 DIAGNOSIS — G43709 Chronic migraine without aura, not intractable, without status migrainosus: Secondary | ICD-10-CM | POA: Diagnosis not present

## 2023-03-08 MED ORDER — QULIPTA 60 MG PO TABS
1.0000 | ORAL_TABLET | Freq: Every day | ORAL | 11 refills | Status: DC
Start: 2023-03-08 — End: 2024-03-06
  Filled 2023-03-08 – 2023-05-16 (×5): qty 30, 30d supply, fill #0
  Filled 2023-06-20: qty 30, 30d supply, fill #1
  Filled 2023-07-12 – 2023-07-14 (×2): qty 30, 30d supply, fill #2
  Filled 2023-08-16: qty 30, 30d supply, fill #3
  Filled 2023-09-05 – 2023-09-18 (×2): qty 30, 30d supply, fill #4
  Filled 2023-12-03: qty 30, 30d supply, fill #5
  Filled 2024-01-07: qty 30, 30d supply, fill #6
  Filled 2024-02-08: qty 30, 30d supply, fill #7

## 2023-03-08 MED ORDER — UBRELVY 100 MG PO TABS
100.0000 mg | ORAL_TABLET | ORAL | 11 refills | Status: DC | PRN
Start: 2023-03-08 — End: 2024-03-06
  Filled 2023-03-08 – 2023-03-27 (×2): qty 16, 30d supply, fill #0
  Filled 2023-09-05 – 2023-09-18 (×2): qty 16, 30d supply, fill #1
  Filled 2023-10-11 – 2023-10-12 (×4): qty 16, 30d supply, fill #2

## 2023-03-08 NOTE — Patient Instructions (Signed)
Nice to see you today.  We will continue your current medication regimen of [place].  I have sent refills.  We will see you back in 1 year.  Please reach out via MyChart if you need something.  Thanks!!

## 2023-03-09 ENCOUNTER — Other Ambulatory Visit (HOSPITAL_COMMUNITY): Payer: Self-pay

## 2023-03-20 ENCOUNTER — Other Ambulatory Visit (HOSPITAL_COMMUNITY): Payer: Self-pay

## 2023-03-27 ENCOUNTER — Other Ambulatory Visit (HOSPITAL_COMMUNITY): Payer: Self-pay

## 2023-03-27 ENCOUNTER — Other Ambulatory Visit: Payer: Self-pay

## 2023-03-27 ENCOUNTER — Other Ambulatory Visit: Payer: Self-pay | Admitting: Internal Medicine

## 2023-03-27 MED ORDER — METFORMIN HCL ER 500 MG PO TB24: 500.0000 mg | ORAL_TABLET | Freq: Every day | ORAL | Status: DC

## 2023-03-28 ENCOUNTER — Other Ambulatory Visit (HOSPITAL_COMMUNITY): Payer: Self-pay

## 2023-03-29 ENCOUNTER — Encounter
Admission: RE | Admit: 2023-03-29 | Discharge: 2023-03-29 | Disposition: A | Discharge status: Home / Self Care | Payer: Commercial Managed Care - PPO | Source: Ambulatory Visit | Attending: Orthopaedic Surgery | Admitting: Orthopaedic Surgery

## 2023-03-29 ENCOUNTER — Ambulatory Visit
Admission: RE | Admit: 2023-03-29 | Discharge: 2023-03-29 | Disposition: A | Discharge status: Home / Self Care | Payer: Commercial Managed Care - PPO | Source: Ambulatory Visit | Attending: Anesthesiology | Admitting: Anesthesiology

## 2023-03-29 ENCOUNTER — Other Ambulatory Visit: Payer: Self-pay

## 2023-03-29 VITALS — Ht 66.0 in | Wt 183.0 lb

## 2023-03-29 DIAGNOSIS — Z9079 Acquired absence of other genital organ(s): Secondary | ICD-10-CM

## 2023-03-29 DIAGNOSIS — Z01812 Encounter for preprocedural laboratory examination: Secondary | ICD-10-CM

## 2023-03-29 DIAGNOSIS — I10 Essential (primary) hypertension: Secondary | ICD-10-CM

## 2023-03-29 DIAGNOSIS — Z01818 Encounter for other preprocedural examination: Secondary | ICD-10-CM | POA: Diagnosis not present

## 2023-03-29 DIAGNOSIS — E118 Type 2 diabetes mellitus with unspecified complications: Secondary | ICD-10-CM | POA: Diagnosis not present

## 2023-03-29 HISTORY — DX: Essential (primary) hypertension: I10

## 2023-03-29 HISTORY — DX: Chronic kidney disease, stage 3a: N18.31

## 2023-03-29 HISTORY — DX: Failed or difficult intubation, initial encounter: T88.4XXA

## 2023-03-29 LAB — BASIC METABOLIC PANEL
Anion gap: 8 (ref 5–15)
BUN: 12 mg/dL (ref 6–20)
CO2: 28 mmol/L (ref 22–32)
Calcium: 9.3 mg/dL (ref 8.9–10.3)
Chloride: 104 mmol/L (ref 98–111)
Creatinine, Ser: 0.7 mg/dL (ref 0.44–1.00)
GFR, Estimated: >60 mL/min (ref >60)
Glucose, Bld: 136 mg/dL — ABNORMAL HIGH (ref 70–99)
Potassium: 3.4 mmol/L — ABNORMAL LOW (ref 3.5–5.1)
Sodium: 140 mmol/L (ref 135–145)

## 2023-03-29 LAB — CBC
HCT: 37.6 % (ref 36.0–46.0)
Hemoglobin: 12.6 g/dL (ref 12.0–15.0)
MCH: 29.9 pg (ref 26.0–34.0)
MCHC: 33.5 g/dL (ref 30.0–36.0)
MCV: 89.1 fL (ref 80.0–100.0)
Platelets: 203 10*3/uL (ref 150–400)
RBC: 4.22 MIL/uL (ref 3.87–5.11)
RDW: 12 % (ref 11.5–15.5)
WBC: 6.7 10*3/uL (ref 4.0–10.5)
nRBC: 0 % (ref 0.0–0.2)

## 2023-03-29 NOTE — Patient Instructions (Addendum)
Your procedure is scheduled on: Thursday, October 31 Report to the Registration Desk on the 1st floor of the [facility] To find out your arrival time, please call [phone] between 1PM - 3PM on: Wednesday, October 30 If your arrival time is 6:00 am, do not arrive before that time as the [facility] entrance doors do not open until 6:00 am.  REMEMBER: Instructions that are not followed completely may result in serious medical risk, up to and including death; or upon the discretion of your surgeon and anesthesiologist your surgery may need to be rescheduled.  Do not eat food after midnight the night before surgery.  No gum chewing or hard candies.  You may however, drink water up to 2 hours before you are scheduled to arrive for your surgery. Do not drink anything within 2 hours of your scheduled arrival time.  In addition, your doctor has ordered for you to drink the provided:  Gatorade G2 Drinking this carbohydrate drink up to two hours before surgery helps to reduce insulin resistance and improve patient outcomes. Please complete drinking 2 hours before scheduled arrival time.  One week prior to surgery: starting October 24 Stop aspirin, meloxicam and Anti-inflammatories (NSAIDS) such as Advil, Aleve, Ibuprofen, Motrin, Naproxen, Naprosyn and Aspirin based products such as Excedrin, Goody's Powder, BC Powder. Stop ANY OVER THE COUNTER supplements until after surgery.  You may however, continue to take Tylenol if needed for pain up until the day of surgery.  Metformin - hold for 2 days before surgery. Last day to take Metformin is Monday, October 28. Resume AFTER surgery.  Semaglutide - hold for 7 days before surgery. Do not take Semaglutide on Sunday, October 27. Resume AFTER surgery on your regular weekly day, Sunday November 3.  Continue taking all of your other prescription medications up until the day of surgery.  ON THE DAY OF SURGERY ONLY TAKE THESE MEDICATIONS    1.   albuterol inhaler, use on the day of surgery and bring to the hospital.  You can leave your Dexcom sensor on your skin; just do NOT have on the right arm.  No Alcohol for 24 hours before or after surgery.  No Smoking including e-cigarettes for 24 hours before surgery.  No chewable tobacco products for at least 6 hours before surgery.  No nicotine patches on the day of surgery.  Do not use any "recreational" drugs for at least a week (preferably 2 weeks) before your surgery.  Please be advised that the combination of cocaine and anesthesia may have negative outcomes, up to and including death. If you test positive for cocaine, your surgery will be cancelled.  On the morning of surgery brush your teeth with toothpaste and water, you may rinse your mouth with mouthwash if you wish. Do not swallow any toothpaste or mouthwash.  Use CHG Soap as directed on instruction sheet.  Do not wear jewelry, make-up, hairpins, clips or nail polish.  For welded (permanent) jewelry: bracelets, anklets, waist bands, etc.  Please have this removed prior to surgery.  If it is not removed, there is a chance that hospital personnel will need to cut it off on the day of surgery.  Do not wear lotions, powders, or perfumes.   Do not shave body hair from the neck down 48 hours before surgery.  Contact lenses, hearing aids and dentures may not be worn into surgery.  Do not bring valuables to the hospital. [facility] is not responsible for any missing/lost belongings or  valuables.   Notify your doctor if there is any change in your medical condition (cold, fever, infection).  Wear comfortable clothing (specific to your surgery type) to the hospital.  After surgery, you can help prevent lung complications by doing breathing exercises.  Take deep breaths and cough every 1-2 hours. Your doctor may order a device called an Incentive Spirometer to help you take deep breaths.  If you are being discharged the day  of surgery, you will not be allowed to drive home. You will need a responsible individual to drive you home and stay with you for 24 hours after surgery.   If you are taking public transportation, you will need to have a responsible individual with you.  Please call the Pre-admissions Testing Dept. at [phone] if you have any questions about these instructions.  Surgery Visitation Policy:  Patients having surgery or a procedure may have two visitors.  Children under the age of 33 must have an adult with them who is not the patient.      Preparing for Surgery with CHLORHEXIDINE GLUCONATE (CHG) Soap  Chlorhexidine Gluconate (CHG) Soap  o An antiseptic cleaner that kills germs and bonds with the skin to continue killing germs even after washing  o Used for showering the night before surgery and morning of surgery  Before surgery, you can play an important role by reducing the number of germs on your skin.  CHG (Chlorhexidine gluconate) soap is an antiseptic cleanser which kills germs and bonds with the skin to continue killing germs even after washing.  Please do not use if you have an allergy to CHG or antibacterial soaps. If your skin becomes reddened/irritated stop using the CHG.  1. Shower the NIGHT BEFORE SURGERY and the MORNING OF SURGERY with CHG soap.  2. If you choose to wash your hair, wash your hair first as usual with your normal shampoo.  3. After shampooing, rinse your hair and body thoroughly to remove the shampoo.  4. Use CHG as you would any other liquid soap. You can apply CHG directly to the skin and wash gently with a scrungie or a clean washcloth.  5. Apply the CHG soap to your body only from the neck down. Do not use on open wounds or open sores. Avoid contact with your eyes, ears, mouth, and genitals (private parts). Wash face and genitals (private parts) with your normal soap.  6. Wash thoroughly, paying special attention to the area where your  surgery will be performed.  7. Thoroughly rinse your body with warm water.  8. Do not shower/wash with your normal soap after using and rinsing off the CHG soap.  9. Pat yourself dry with a clean towel.  10. Wear clean pajamas to bed the night before surgery.  12. Place clean sheets on your bed the night of your first shower and do not sleep with pets.  13. Shower again with the CHG soap on the day of surgery prior to arriving at the hospital.  14. Do not apply any deodorants/lotions/powders.  15. Please wear clean clothes to the hospital.

## 2023-03-30 ENCOUNTER — Other Ambulatory Visit: Payer: Self-pay

## 2023-03-31 ENCOUNTER — Other Ambulatory Visit (HOSPITAL_COMMUNITY): Payer: Self-pay

## 2023-04-05 ENCOUNTER — Other Ambulatory Visit: Payer: Self-pay | Admitting: Internal Medicine

## 2023-04-05 NOTE — Anesthesia Preprocedure Evaluation (Addendum)
Anesthesia Evaluation  Patient identified by MRN, date of birth, ID band Patient awake    Reviewed: Allergy & Precautions, NPO status , Patient's Chart, lab work & pertinent test results  History of Anesthesia Complications (+) DIFFICULT AIRWAY and history of anesthetic complications (2018- ETT with grade 3 view with MAC 4 blade)  Airway Mallampati: II  TM Distance: >3 FB Neck ROM: Full    Dental no notable dental hx.    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 -Continue Qulipta 60 mg daily for migraine preventative -Continue Ubrelvy 100 mg as needed for acute migraine treatment      GI/Hepatic negative GI ROS, Neg liver ROS,,,  Endo/Other  diabetes, Well Controlled, Type 2    Renal/GU negative Renal ROS     Musculoskeletal  (+) Arthritis ,    Abdominal Normal abdominal exam  (+)   Peds  Hematology negative hematology ROS (+)   Anesthesia Other Findings   Reproductive/Obstetrics                             Anesthesia Physical Anesthesia Plan  ASA: 2  Anesthesia Plan: General ETT   Post-op Pain Management: Regional block* and Minimal or no pain anticipated   Induction: Intravenous  PONV Risk Score and Plan:   Airway Management Planned: Oral ETT  Additional Equipment:   Intra-op Plan:   Post-operative Plan: Extubation in OR  Informed Consent: I have reviewed the patients History and Physical, chart, labs and discussed the procedure including the risks, benefits and alternatives for the proposed anesthesia with the patient or authorized representative who has indicated his/her understanding and acceptance.     Dental advisory  given  Plan Discussed with: Anesthesiologist and CRNA  Anesthesia Plan Comments:         Anesthesia Quick Evaluation

## 2023-04-06 ENCOUNTER — Ambulatory Visit
Admission: RE | Admit: 2023-04-06 | Discharge: 2023-04-06 | Disposition: A | Discharge status: Home / Self Care | Payer: Commercial Managed Care - PPO | Attending: Orthopaedic Surgery | Admitting: Orthopaedic Surgery

## 2023-04-06 ENCOUNTER — Ambulatory Visit: Payer: Commercial Managed Care - PPO | Admitting: Anesthesiology

## 2023-04-06 ENCOUNTER — Encounter: Payer: Self-pay | Admitting: Orthopaedic Surgery

## 2023-04-06 ENCOUNTER — Other Ambulatory Visit: Payer: Self-pay

## 2023-04-06 ENCOUNTER — Ambulatory Visit: Payer: Commercial Managed Care - PPO | Admitting: Urgent Care

## 2023-04-06 ENCOUNTER — Ambulatory Visit: Payer: Commercial Managed Care - PPO

## 2023-04-06 ENCOUNTER — Encounter
Admission: RE | Disposition: A | Discharge status: Home / Self Care | Payer: Self-pay | Source: Home / Self Care | Attending: Orthopaedic Surgery

## 2023-04-06 ENCOUNTER — Other Ambulatory Visit (HOSPITAL_COMMUNITY): Payer: Self-pay

## 2023-04-06 ENCOUNTER — Other Ambulatory Visit: Payer: Self-pay | Admitting: Orthopaedic Surgery

## 2023-04-06 DIAGNOSIS — M75101 Unspecified rotator cuff tear or rupture of right shoulder, not specified as traumatic: Secondary | ICD-10-CM | POA: Diagnosis not present

## 2023-04-06 DIAGNOSIS — S46011A Strain of muscle(s) and tendon(s) of the rotator cuff of right shoulder, initial encounter: Secondary | ICD-10-CM | POA: Diagnosis not present

## 2023-04-06 DIAGNOSIS — J45909 Unspecified asthma, uncomplicated: Secondary | ICD-10-CM | POA: Diagnosis not present

## 2023-04-06 DIAGNOSIS — G8918 Other acute postprocedural pain: Secondary | ICD-10-CM | POA: Diagnosis not present

## 2023-04-06 DIAGNOSIS — E119 Type 2 diabetes mellitus without complications: Secondary | ICD-10-CM | POA: Diagnosis not present

## 2023-04-06 DIAGNOSIS — Z79899 Other long term (current) drug therapy: Secondary | ICD-10-CM | POA: Insufficient documentation

## 2023-04-06 DIAGNOSIS — M7501 Adhesive capsulitis of right shoulder: Secondary | ICD-10-CM | POA: Insufficient documentation

## 2023-04-06 DIAGNOSIS — Z7985 Long-term (current) use of injectable non-insulin antidiabetic drugs: Secondary | ICD-10-CM | POA: Diagnosis not present

## 2023-04-06 DIAGNOSIS — Z794 Long term (current) use of insulin: Secondary | ICD-10-CM | POA: Diagnosis not present

## 2023-04-06 DIAGNOSIS — G43909 Migraine, unspecified, not intractable, without status migrainosus: Secondary | ICD-10-CM | POA: Insufficient documentation

## 2023-04-06 DIAGNOSIS — Z7984 Long term (current) use of oral hypoglycemic drugs: Secondary | ICD-10-CM | POA: Insufficient documentation

## 2023-04-06 DIAGNOSIS — Z9889 Other specified postprocedural states: Secondary | ICD-10-CM | POA: Diagnosis not present

## 2023-04-06 DIAGNOSIS — M65911 Unspecified synovitis and tenosynovitis, right shoulder: Secondary | ICD-10-CM | POA: Insufficient documentation

## 2023-04-06 DIAGNOSIS — Z01812 Encounter for preprocedural laboratory examination: Secondary | ICD-10-CM

## 2023-04-06 DIAGNOSIS — I10 Essential (primary) hypertension: Secondary | ICD-10-CM | POA: Insufficient documentation

## 2023-04-06 DIAGNOSIS — E118 Type 2 diabetes mellitus with unspecified complications: Secondary | ICD-10-CM

## 2023-04-06 HISTORY — PX: SHOULDER ARTHROSCOPY WITH ROTATOR CUFF REPAIR AND SUBACROMIAL DECOMPRESSION: SHX5686

## 2023-04-06 LAB — GLUCOSE, CAPILLARY
Glucose-Capillary: 139 mg/dL — ABNORMAL HIGH (ref 70–99)
Glucose-Capillary: 144 mg/dL — ABNORMAL HIGH (ref 70–99)

## 2023-04-06 SURGERY — SHOULDER ARTHROSCOPY WITH ROTATOR CUFF REPAIR AND SUBACROMIAL DECOMPRESSION
Anesthesia: General

## 2023-04-06 MED ORDER — GABAPENTIN 300 MG PO CAPS
ORAL_CAPSULE | ORAL | Status: AC
  Filled 2023-04-06: qty 1

## 2023-04-06 MED ORDER — ONDANSETRON HCL 4 MG/2ML IJ SOLN
INTRAMUSCULAR | Status: DC | PRN
  Administered 2023-04-06: 4 mg via INTRAVENOUS

## 2023-04-06 MED ORDER — ACETAMINOPHEN 500 MG PO TABS
500.0000 mg | ORAL_TABLET | Freq: Three times a day (TID) | ORAL | 0 refills | Status: AC
Start: 2023-04-06 — End: 2023-04-16
  Filled 2023-04-06 (×2): qty 30, 10d supply, fill #0

## 2023-04-06 MED ORDER — FENTANYL CITRATE PF 50 MCG/ML IJ SOSY
PREFILLED_SYRINGE | INTRAMUSCULAR | Status: AC
  Filled 2023-04-06: qty 1

## 2023-04-06 MED ORDER — BUPIVACAINE LIPOSOME 1.3 % IJ SUSP
INTRAMUSCULAR | Status: AC
  Filled 2023-04-06: qty 10

## 2023-04-06 MED ORDER — FENTANYL CITRATE (PF) 100 MCG/2ML IJ SOLN
INTRAMUSCULAR | Status: AC
  Filled 2023-04-06: qty 2

## 2023-04-06 MED ORDER — LIDOCAINE HCL (PF) 2 % IJ SOLN
INTRAMUSCULAR | Status: AC
  Filled 2023-04-06: qty 5

## 2023-04-06 MED ORDER — OXYCODONE HCL 5 MG PO TABS
5.0000 mg | ORAL_TABLET | ORAL | 0 refills | Status: DC | PRN
  Filled 2023-04-06 (×2): qty 15, 3d supply, fill #0

## 2023-04-06 MED ORDER — TRANEXAMIC ACID-NACL 1000-0.7 MG/100ML-% IV SOLN
1000.0000 mg | INTRAVENOUS | Status: AC
  Administered 2023-04-06: 1000 mg via INTRAVENOUS

## 2023-04-06 MED ORDER — PROPOFOL 10 MG/ML IV BOLUS
INTRAVENOUS | Status: DC | PRN
  Administered 2023-04-06: 190 mg via INTRAVENOUS
  Administered 2023-04-06: 150 ug/kg/min via INTRAVENOUS
  Administered 2023-04-06: 1 mg via INTRAVENOUS
  Administered 2023-04-06: 125 mg via INTRAVENOUS

## 2023-04-06 MED ORDER — CHLORHEXIDINE GLUCONATE 0.12 % MT SOLN
15.0000 mL | Freq: Once | OROMUCOSAL | Status: AC
Start: 2023-04-06 — End: 2023-04-06
  Administered 2023-04-06: 15 mL via OROMUCOSAL

## 2023-04-06 MED ORDER — EPINEPHRINE PF 1 MG/ML IJ SOLN
INTRAMUSCULAR | Status: AC
  Filled 2023-04-06: qty 2

## 2023-04-06 MED ORDER — PHENYLEPHRINE 80 MCG/ML (10ML) SYRINGE FOR IV PUSH (FOR BLOOD PRESSURE SUPPORT)
PREFILLED_SYRINGE | INTRAVENOUS | Status: DC | PRN
  Administered 2023-04-06 (×2): 160 ug via INTRAVENOUS
  Administered 2023-04-06: 80 ug via INTRAVENOUS
  Administered 2023-04-06 (×4): 160 ug via INTRAVENOUS

## 2023-04-06 MED ORDER — SUGAMMADEX SODIUM 200 MG/2ML IV SOLN
INTRAVENOUS | Status: DC | PRN
  Administered 2023-04-06 (×2): 200 mg via INTRAVENOUS

## 2023-04-06 MED ORDER — TRANEXAMIC ACID-NACL 1000-0.7 MG/100ML-% IV SOLN
INTRAVENOUS | Status: AC
  Filled 2023-04-06: qty 100

## 2023-04-06 MED ORDER — BUPIVACAINE LIPOSOME 1.3 % IJ SUSP
INTRAMUSCULAR | Status: DC | PRN
  Administered 2023-04-06: 10 mL via PERINEURAL

## 2023-04-06 MED ORDER — ONDANSETRON HCL 4 MG/2ML IJ SOLN
INTRAMUSCULAR | Status: AC
  Filled 2023-04-06: qty 2

## 2023-04-06 MED ORDER — BUPIVACAINE HCL (PF) 0.5 % IJ SOLN
INTRAMUSCULAR | Status: DC | PRN
  Administered 2023-04-06: 10 mL via PERINEURAL

## 2023-04-06 MED ORDER — KETOROLAC TROMETHAMINE 30 MG/ML IJ SOLN
INTRAMUSCULAR | Status: DC | PRN
  Administered 2023-04-06: 30 mg via INTRAVENOUS

## 2023-04-06 MED ORDER — BUPIVACAINE HCL (PF) 0.5 % IJ SOLN
INTRAMUSCULAR | Status: AC
  Filled 2023-04-06: qty 10

## 2023-04-06 MED ORDER — KETOROLAC TROMETHAMINE 30 MG/ML IJ SOLN
INTRAMUSCULAR | Status: AC
  Filled 2023-04-06: qty 1

## 2023-04-06 MED ORDER — PROPOFOL 1000 MG/100ML IV EMUL
INTRAVENOUS | Status: AC
  Filled 2023-04-06: qty 400

## 2023-04-06 MED ORDER — SODIUM CHLORIDE 0.9 % IV SOLN: INTRAVENOUS | Status: DC

## 2023-04-06 MED ORDER — DROPERIDOL 2.5 MG/ML IJ SOLN
INTRAMUSCULAR | Status: AC
  Filled 2023-04-06: qty 2

## 2023-04-06 MED ORDER — ORAL CARE MOUTH RINSE: 15.0000 mL | Freq: Once | OROMUCOSAL | Status: AC

## 2023-04-06 MED ORDER — MIDAZOLAM HCL 2 MG/2ML IJ SOLN
INTRAMUSCULAR | Status: AC
  Filled 2023-04-06: qty 2

## 2023-04-06 MED ORDER — GABAPENTIN 300 MG PO CAPS
300.0000 mg | ORAL_CAPSULE | Freq: Once | ORAL | Status: AC
  Administered 2023-04-06: 300 mg via ORAL

## 2023-04-06 MED ORDER — DEXAMETHASONE SODIUM PHOSPHATE 10 MG/ML IJ SOLN
INTRAMUSCULAR | Status: AC
  Filled 2023-04-06: qty 1

## 2023-04-06 MED ORDER — DROPERIDOL 2.5 MG/ML IJ SOLN
0.6250 mg | Freq: Once | INTRAMUSCULAR | Status: AC | PRN
  Administered 2023-04-06: 0.625 mg via INTRAVENOUS

## 2023-04-06 MED ORDER — ACETAMINOPHEN 500 MG PO TABS
1000.0000 mg | ORAL_TABLET | Freq: Once | ORAL | Status: AC
  Administered 2023-04-06: 1000 mg via ORAL

## 2023-04-06 MED ORDER — BUPIVACAINE HCL (PF) 0.25 % IJ SOLN
INTRAMUSCULAR | Status: AC
  Filled 2023-04-06: qty 30

## 2023-04-06 MED ORDER — LIDOCAINE HCL (CARDIAC) PF 100 MG/5ML IV SOSY
PREFILLED_SYRINGE | INTRAVENOUS | Status: DC | PRN
  Administered 2023-04-06: 30 mg via INTRAVENOUS

## 2023-04-06 MED ORDER — ACETAMINOPHEN 500 MG PO TABS
ORAL_TABLET | ORAL | Status: AC
  Filled 2023-04-06: qty 2

## 2023-04-06 MED ORDER — OXYCODONE HCL 5 MG/5ML PO SOLN: 5.0000 mg | Freq: Once | ORAL | Status: DC | PRN

## 2023-04-06 MED ORDER — ROCURONIUM BROMIDE 100 MG/10ML IV SOLN
INTRAVENOUS | Status: DC | PRN
  Administered 2023-04-06: 20 mg via INTRAVENOUS
  Administered 2023-04-06: 50 mg via INTRAVENOUS

## 2023-04-06 MED ORDER — CEFAZOLIN SODIUM-DEXTROSE 2-4 GM/100ML-% IV SOLN
INTRAVENOUS | Status: AC
  Filled 2023-04-06: qty 100

## 2023-04-06 MED ORDER — METFORMIN HCL ER 500 MG PO TB24: 500.0000 mg | ORAL_TABLET | Freq: Every day | ORAL | Status: DC

## 2023-04-06 MED ORDER — FENTANYL CITRATE (PF) 100 MCG/2ML IJ SOLN
25.0000 ug | INTRAMUSCULAR | Status: DC | PRN
Start: 2023-04-06 — End: 2023-04-06

## 2023-04-06 MED ORDER — PROPOFOL 1000 MG/100ML IV EMUL
INTRAVENOUS | Status: AC
  Filled 2023-04-06: qty 100

## 2023-04-06 MED ORDER — DEXMEDETOMIDINE HCL IN NACL 80 MCG/20ML IV SOLN
INTRAVENOUS | Status: DC | PRN
  Administered 2023-04-06: 4 ug via INTRAVENOUS

## 2023-04-06 MED ORDER — LACTATED RINGERS IV SOLN
INTRAVENOUS | Status: DC | PRN
  Administered 2023-04-06: 2 mL

## 2023-04-06 MED ORDER — CHLORHEXIDINE GLUCONATE 0.12 % MT SOLN
OROMUCOSAL | Status: AC
  Filled 2023-04-06: qty 15

## 2023-04-06 MED ORDER — FENTANYL CITRATE (PF) 100 MCG/2ML IJ SOLN
INTRAMUSCULAR | Status: DC | PRN
  Administered 2023-04-06 (×2): 50 ug via INTRAVENOUS

## 2023-04-06 MED ORDER — ACETAMINOPHEN 10 MG/ML IV SOLN: 1000.0000 mg | Freq: Once | INTRAVENOUS | Status: DC | PRN

## 2023-04-06 MED ORDER — ROCURONIUM BROMIDE 10 MG/ML (PF) SYRINGE
PREFILLED_SYRINGE | INTRAVENOUS | Status: AC
  Filled 2023-04-06: qty 10

## 2023-04-06 MED ORDER — ASPIRIN 325 MG PO TBEC
325.0000 mg | DELAYED_RELEASE_TABLET | Freq: Every day | ORAL | 0 refills | Status: DC
  Filled 2023-04-06 (×2): qty 30, 30d supply, fill #0

## 2023-04-06 MED ORDER — SEVOFLURANE IN SOLN
RESPIRATORY_TRACT | Status: AC
  Filled 2023-04-06: qty 250

## 2023-04-06 MED ORDER — CEFAZOLIN SODIUM-DEXTROSE 2-4 GM/100ML-% IV SOLN
2.0000 g | INTRAVENOUS | Status: AC
  Administered 2023-04-06: 2 g via INTRAVENOUS

## 2023-04-06 MED ORDER — OXYCODONE HCL 5 MG PO TABS: 5.0000 mg | ORAL_TABLET | Freq: Once | ORAL | Status: DC | PRN

## 2023-04-06 MED ORDER — IBUPROFEN 800 MG PO TABS
800.0000 mg | ORAL_TABLET | Freq: Three times a day (TID) | ORAL | 0 refills | Status: DC
  Filled 2023-04-06 (×2): qty 30, 10d supply, fill #0

## 2023-04-06 MED ORDER — MIDAZOLAM HCL 5 MG/5ML IJ SOLN
INTRAMUSCULAR | Status: DC | PRN
  Administered 2023-04-06: 2 mg via INTRAVENOUS

## 2023-04-06 MED ORDER — PHENYLEPHRINE HCL-NACL 20-0.9 MG/250ML-% IV SOLN
INTRAVENOUS | Status: DC | PRN
  Administered 2023-04-06: 25 ug/min via INTRAVENOUS

## 2023-04-06 MED ORDER — PROPOFOL 10 MG/ML IV BOLUS
INTRAVENOUS | Status: AC
  Filled 2023-04-06: qty 40

## 2023-04-06 MED ORDER — FLUCONAZOLE 150 MG PO TABS: 150.0000 mg | ORAL_TABLET | Freq: Every day | ORAL | 0 refills | Status: DC

## 2023-04-06 SURGICAL SUPPLY — 71 items
ANCH SUT 2 FBRTK KNTLS 1.8 (Anchor) IMPLANT
ANCH SUT KNTLS STRL SHLDR SYS (Anchor) ×2 IMPLANT
ANCHOR DUAL INSERTER (Anchor) ×3 IMPLANT
ANCHOR JUGGERKNOT 1.5 SOFT S-P (Anchor) ×1 IMPLANT
ANCHOR JUGGERKNOT SOFT S-P (Anchor) ×2 IMPLANT
ANCHOR SUT QUATTRO KNTLS 4.5 (Anchor) ×1 IMPLANT
APL PRP STRL LF DISP 70% ISPRP (MISCELLANEOUS) ×2
BLADE EXCALIBUR 4.0X13 (MISCELLANEOUS) ×1 IMPLANT
BLADE SHAVER 4.5X7 STR FR (MISCELLANEOUS) ×2 IMPLANT
BUR ABRADER 4.0 W/FLUTE AQUA (MISCELLANEOUS) ×2 IMPLANT
BURR ABRADER 4.0 W/FLUTE AQUA (MISCELLANEOUS) ×2
BURR OVAL 8 FLU 4.0X13 (MISCELLANEOUS) IMPLANT
CANNULA 5.75X71 LONG (CANNULA) IMPLANT
CANNULA PASSPORT 5 (CANNULA) IMPLANT
CANNULA PASSPORT BUTTON 10-40 (CANNULA) IMPLANT
CANNULA TWIST IN 8.25X7CM (CANNULA) ×2 IMPLANT
CHLORAPREP W/TINT 26 (MISCELLANEOUS) ×3 IMPLANT
COOLER POLAR GLACIER W/PUMP (MISCELLANEOUS) ×2 IMPLANT
DRAPE ARTHROSCOPY W/POUCH 90 (DRAPES) ×2 IMPLANT
DRAPE IMP U-DRAPE 54X76 (DRAPES) ×2 IMPLANT
DRAPE INCISE IOBAN 66X45 STRL (DRAPES) ×2 IMPLANT
DRAPE U-SHAPE 47X51 STRL (DRAPES) ×4 IMPLANT
DW OUTFLOW CASSETTE/TUBE SET (MISCELLANEOUS) ×2 IMPLANT
GAUZE PAD ABD 8X10 STRL (GAUZE/BANDAGES/DRESSINGS) ×2 IMPLANT
GAUZE SPONGE 4X4 12PLY STRL (GAUZE/BANDAGES/DRESSINGS) ×2 IMPLANT
GAUZE XEROFORM 1X8 LF (GAUZE/BANDAGES/DRESSINGS) ×2 IMPLANT
GLOVE BIO SURGEON STRL SZ 6 (GLOVE) ×4 IMPLANT
GLOVE BIO SURGEON STRL SZ7.5 (GLOVE) ×3 IMPLANT
GLOVE BIOGEL PI IND STRL 6.5 (GLOVE) ×2 IMPLANT
GLOVE BIOGEL PI IND STRL 8 (GLOVE) ×3 IMPLANT
GOWN SRG XL LVL 3 NONREINFORCE (GOWNS) ×2 IMPLANT
GOWN STRL NON-REIN TWL XL LVL3 (GOWNS) ×2
GOWN STRL REUS W/ TWL LRG LVL3 (GOWN DISPOSABLE) ×4 IMPLANT
GOWN STRL REUS W/TWL LRG LVL3 (GOWN DISPOSABLE) ×4
IV LR IRRIG 3000ML ARTHROMATIC (IV SOLUTION) ×6 IMPLANT
KIT STABILIZATION SHOULDER (MISCELLANEOUS) ×2 IMPLANT
KIT STR SPEAR 1.8 FBRTK DISP (KITS) IMPLANT
LASSO 90 CVE QUICKPAS (DISPOSABLE) IMPLANT
LASSO CRESCENT QUICKPASS (SUTURE) IMPLANT
MANIFOLD NEPTUNE II (INSTRUMENTS) ×2 IMPLANT
MASK FACE SPIDER DISP (MASK) ×1 IMPLANT
NDL HD SCORPION MEGA LOADER (NEEDLE) IMPLANT
NDL SAFETY ECLIPSE 18X1.5 (NEEDLE) ×2 IMPLANT
NDL SUT PASSER RTC (NEEDLE) IMPLANT
NEEDLE SUT PASSER RTC (NEEDLE) ×2 IMPLANT
PACK ARTHROSCOPY SHOULDER (MISCELLANEOUS) ×2 IMPLANT
PAD ABD DERMACEA PRESS 5X9 (GAUZE/BANDAGES/DRESSINGS) ×2 IMPLANT
PAD WRAPON POLAR SHDR UNIV (MISCELLANEOUS) IMPLANT
PAD WRAPON POLAR SHDR XLG (MISCELLANEOUS) ×1 IMPLANT
PORT APPOLLO RF 90DEGREE MULTI (SURGICAL WAND) IMPLANT
SLING ULTRA II M (MISCELLANEOUS) ×1 IMPLANT
SUT ETHILON 3 0 PS 1 (SUTURE) ×2 IMPLANT
SUT FIBERWIRE #2 38 T-5 BLUE (SUTURE)
SUT PDS AB 1 CT 36 (SUTURE) IMPLANT
SUTURE FIBERWR #2 38 T-5 BLUE (SUTURE) IMPLANT
SUTURE TAPE 1.3 40 TPR END (SUTURE) IMPLANT
SUTURE TAPE TIGERLINK 1.3MM BL (SUTURE) IMPLANT
SUTURETAPE 1.3 40 TPR END (SUTURE)
SUTURETAPE TIGERLINK 1.3MM BL (SUTURE)
SYR 5ML LL (SYRINGE) ×2 IMPLANT
TAPE CLOTH SURG 4X10 WHT LF (GAUZE/BANDAGES/DRESSINGS) ×1 IMPLANT
TAPE FIBER 2MM 7IN #2 BLUE (SUTURE) IMPLANT
TAPESTRY RC STS 28X28 (Orthopedic Implant) ×2 IMPLANT
TENDON TAPESTRY RC STS 28X28 (Orthopedic Implant) ×1 IMPLANT
TOWEL OR 17X26 4PK STRL BLUE (TOWEL DISPOSABLE) ×2 IMPLANT
TUBE SET DOUBLEFLO INFLOW (TUBING) ×2 IMPLANT
TUBE SET DOUBLEFLO OUTFLOW (TUBING) ×2 IMPLANT
TUBING CONNECTING 10 (TUBING) ×2 IMPLANT
WAND WEREWOLF FLOW 90D (MISCELLANEOUS) ×2 IMPLANT
WRAPON POLAR PAD SHDR UNIV (MISCELLANEOUS)
WRAPON POLAR PAD SHDR XLG (MISCELLANEOUS)

## 2023-04-06 NOTE — Interval H&P Note (Signed)
History and Physical Interval Note:  04/06/2023 6:36 AM  [name]  has presented today for surgery, with the diagnosis of RIGHT SHOULDER ADHESIVE CAPSULITIS.  The various methods of treatment have been discussed with the patient and family. After consideration of risks, benefits and other options for treatment, the patient has consented to  Procedure(s): RIGHT ARTHROSCOPY SHOULDER / EXTENSIVE DEBRIDEMENT / POSSIBLE COLLAGEN PATCH AUGMENTATION (Right) RIGHT SHOULDER ACROMIOPLASTY (Right) as a surgical intervention.  The patient's history has been reviewed, patient examined, no change in status, stable for surgery.  I have reviewed the patient's chart and labs.  Questions were answered to the patient's satisfaction.     Huel Cote

## 2023-04-06 NOTE — Brief Op Note (Signed)
   Brief Op Note  Date of Surgery: 04/06/2023  Preoperative Diagnosis: RIGHT SHOULDER ADHESIVE CAPSULITIS  Postoperative Diagnosis: same  Procedure: Procedure(s): RIGHT ARTHROSCOPY SHOULDER / EXTENSIVE DEBRIDEMENT / POSSIBLE COLLAGEN PATCH AUGMENTATION RIGHT SHOULDER ACROMIOPLASTY  Implants: Implant Name Type Inv. Item Serial No. Manufacturer Lot No. LRB No. Used Action  ANCHOR JUGGERKNOT SOFT S-P - [id number] Anchor ANCHOR JUGGERKNOT SOFT S-P  ZIMMER RECON(ORTH,TRAU,BIO,SG) [id number] Right 1 Implanted  [facility] SUT KNTLS STRL SHLDR SYS - [id number] Anchor ANCH SUT KNTLS STRL SHLDR SYS  ZIMMER RECON(ORTH,TRAU,BIO,SG) [id number] Right 1 Implanted  TAPESTRY RC STS 28X28 - [id number] Orthopedic Implant TAPESTRY RC STS 28X28  ZIMMER RECON(ORTH,TRAU,BIO,SG) [id number] Right 1 Implanted  ANCHOR DUAL INSERTER - [id number] Anchor ANCHOR DUAL INSERTER  ZIMMER RECON(ORTH,TRAU,BIO,SG) [id number] Right 2 Implanted  ANCHOR DUAL INSERTER - [id number] Anchor ANCHOR DUAL INSERTER  ZIMMER RECON(ORTH,TRAU,BIO,SG) [id number] Right 1 Implanted    Surgeons: Surgeon(s): Huel Cote, MD  Anesthesia: Choice    Estimated Blood Loss: See anesthesia record  Complications: None  Condition to PACU: Stable  Benancio Deeds, MD 04/06/2023 9:21 AM

## 2023-04-06 NOTE — Anesthesia Procedure Notes (Signed)
Procedure Name: Intubation Date/Time: 04/06/2023 7:48 AM  Performed by: Genia Del, CRNAPre-anesthesia Checklist: Patient identified, Patient being monitored, Timeout performed, Emergency Drugs available and Suction available Patient Re-evaluated:Patient Re-evaluated prior to induction Oxygen Delivery Method: Circle system utilized Preoxygenation: Pre-oxygenation with 100% oxygen Induction Type: IV induction Ventilation: Mask ventilation without difficulty Laryngoscope Size: 4 and McGraph Grade View: Grade I Tube type: Oral Tube size: 7.0 mm Number of attempts: 1 Airway Equipment and Method: Stylet Placement Confirmation: ETT inserted through vocal cords under direct vision, positive ETCO2 and breath sounds checked- equal and bilateral Secured at: 22 cm Tube secured with: Tape (left lip)

## 2023-04-06 NOTE — Discharge Instructions (Addendum)
Discharge Instructions    Attending Surgeon: Huel Cote, MD Office Phone Number: [phone]   Diagnosis and Procedures:    Surgeries Performed: Right shoulder rotator cuff repair Discharge Plan:    Diet: Resume usual diet. Begin with light or bland foods.  Drink plenty of fluids.  Activity:  Keep sling and dressing in place until your follow up visit in Physical Therapy You are advised to go home directly from the hospital or surgical center. Restrict your activities.  GENERAL INSTRUCTIONS: 1.  Keep your surgical site elevated above your heart for at least 5-7 days or longer to prevent swelling. This will improve your comfort and your overall recovery following surgery.     2. Please call Dr. Serena Croissant office at [phone] with questions Monday-Friday during business hours. If no one answers, please leave a message and someone should get back to the patient within 24 hours. For emergencies please call 911 or proceed to the emergency room.   3. Patient to notify surgical team if experiences any of the following: Bowel/Bladder dysfunction, uncontrolled pain, nerve/muscle weakness, incision with increased drainage or redness, nausea/vomiting and Fever greater than 101.0 F.  Be alert for signs of infection including redness, streaking, odor, fever or chills. Be alert for excessive pain or bleeding and notify your surgeon immediately.  WOUND INSTRUCTIONS:   Leave your dressing/cast/splint in place until your post operative visit.  Keep it clean and dry.  Always keep the incision clean and dry until the staples/sutures are removed. If there is no drainage from the incision you should keep it open to air. If there is drainage from the incision you must keep it covered at all times until the drainage stops  Do not soak in a bath tub, hot tub, pool, lake or other body of water until 21 days after your surgery and your incision is completely dry and healed.  If you have  removable sutures (or staples) they must be removed 10-14 days (unless otherwise instructed) from the day of your surgery.     1)  Elevate the extremity as much as possible.  2)  Keep the dressing clean and dry.  3)  Please call us if the dressing becomes wet or dirty.  4)  If you are experiencing worsening pain or worsening swelling, please call.     MEDICATIONS: Resume all previous home medications at the previous prescribed dose and frequency unless otherwise noted Start taking the  pain medications on an as-needed basis as prescribed  Please taper down pain medication over the next week following surgery.  Ideally you should not require a refill of any narcotic pain medication.  Take pain medication with food to minimize nausea. In addition to the prescribed pain medication, you may take over-the-counter pain relievers such as Tylenol.  Do NOT take additional tylenol if your pain medication already has tylenol in it.  Aspirin 325mg  daily for four weeks.      FOLLOWUP INSTRUCTIONS: 1. Follow up at the Physical Therapy Clinic 3-4 days following surgery. This appointment should be scheduled unless other arrangements have been made.The Physical Therapy scheduling number is [phone] if an appointment has not already been arranged.  2. Contact Dr. Serena Croissant office during office hours at [phone] or the practice after hours line at [phone] for non-emergencies. For medical emergencies call 911.   Discharge Location: Home      Interscalene Nerve Block with Exparel   For your surgery you have received an Interscalene Nerve  Block with Exparel. Nerve Blocks affect many types of nerves, including nerves that control movement, pain and normal sensation.  You may experience feelings such as numbness, tingling, heaviness, weakness or the inability to move your arm or the feeling or sensation that your arm has "fallen asleep". A nerve block with Exparel can last up to 5 days.   Usually the weakness wears off first.  The tingling and heaviness usually wear off next.  Finally you may start to notice pain.  Keep in mind that this may occur in any order.  Once a nerve block starts to wear off it is usually completely gone within 60 minutes. ISNB may cause mild shortness of breath, a hoarse voice, blurry vision, unequal pupils, or drooping of the face on the same side as the nerve block.  These symptoms will usually resolve with the numbness.  Very rarely the procedure itself can cause mild seizures. If needed, your surgeon will give you a prescription for pain medication.  It will take about 60 minutes for the oral pain medication to become fully effective.  So, it is recommended that you start taking this medication before the nerve block first begins to wear off, or when you first begin to feel discomfort. Take your pain medication only as prescribed.  Pain medication can cause sedation and decrease your breathing if you take more than you need for the level of pain that you have. Nausea is a common side effect of many pain medications.  You may want to eat something before taking your pain medicine to prevent nausea. After an Interscalene nerve block, you cannot feel pain, pressure or extremes in temperature in the effected arm.  Because your arm is numb it is at an increased risk for injury.  To decrease the possibility of injury, please practice the following:  While you are awake change the position of your arm frequently to prevent too much pressure on any one area for prolonged periods of time.  If you have a cast or tight dressing, check the color or your fingers every couple of hours.  Call your surgeon with the appearance of any discoloration (white or blue). If you are given a sling to wear before you go home, please wear it  at all times until the block has completely worn off.  Do not get up at night without your sling. Please contact [facility]est[facility] or your surgeon if  you do not begin to regain sensation after 7 days from the surgery.  Anesthesia may be contacted by calling the Same Day Surgery Department, Mon. through Fri., 6 am to 4 pm at [phone].   If you experience any other problems or concerns, please contact your surgeon's office. If you experience severe or prolonged shortness of breath go to the nearest emergency department.   SHOULDER SLING IMMOBILIZER   VIDEO Slingshot 2 Shoulder Brace Application - YouTube ---[email or web address]/  INSTRUCTIONS While supporting the injured arm, slide the forearm into the sling. Wrap the adjustable shoulder strap around the neck and shoulders and attach the strap end to the sling using  the "alligator strap tab."  Adjust the shoulder strap to the required length. Position the shoulder pad behind the neck. To secure the shoulder pad location (optional), pull the shoulder strap away from the shoulder pad, unfold the hook material on the top of the pad, then press the shoulder strap back onto the hook material to secure the pad in place. Attach the closure strap across the open  top of the sling. Position the strap so that it holds the arm securely in the sling. Next, attach the thumb strap to the open end of the sling between the thumb and fingers. After sling has been fit, it may be easily removed and reapplied using the quick release buckle on shoulder strap. If a neutral pillow or 15 abduction pillow is included, place the pillow at the waistline. Attach the sling to the pillow, lining up hook material on the pillow with the loop on sling. Adjust the waist strap to fit.  If waist strap is too long, cut it to fit. Use the small piece of double sided hook material (located on top of the pillow) to secure the strap end. Place the double sided hook material on the inside of the cut strap end and secure it to the waist strap.     If no pillow is included, attach the waist strap to the sling and  adjust to fit.    Washing Instructions: Straps and sling must be removed and cleaned regularly depending on your activity level and perspiration. Hand wash straps and sling in cold water with mild detergent, rinse, air dry      POLAR CARE INFORMATION  MassAdvertisement.it  How to use Breg Polar Care [facility] Therapy System?  YouTube   ShippingScam.co.uk  OPERATING INSTRUCTIONS  Start the product With dry hands, connect the transformer to the electrical connection located on the top of the cooler. Next, plug the transformer into an appropriate electrical outlet. The unit will automatically start running at this point.  To stop the pump, disconnect electrical power.  Unplug to stop the product when not in use. Unplugging the Polar Care unit turns it off. Always unplug immediately after use. Never leave it plugged in while unattended. Remove pad.    FIRST ADD WATER TO FILL LINE, THEN ICE---Replace ice when existing ice is almost melted  1 Discuss Treatment with your Licensed Health Care Practitioner and Use Only as Prescribed 2 Apply Insulation Barrier & Cold Therapy Pad 3 Check for Moisture 4 Inspect Skin Regularly  Tips and Trouble Shooting Usage Tips 1. Use cubed or chunked ice for optimal performance. 2. It is recommended to drain the Pad between uses. To drain the pad, hold the Pad upright with the hose pointed toward the ground. Depress the black plunger and allow water to drain out. 3. You may disconnect the Pad from the unit without removing the pad from the affected area by depressing the silver tabs on the hose coupling and gently pulling the hoses apart. The Pad and unit will seal itself and will not leak. Note: Some dripping during release is normal. 4. DO NOT RUN PUMP WITHOUT WATER! The pump in this unit is designed to run with water. Running the unit without water will cause permanent damage to the pump. 5. Unplug unit before removing  lid.  TROUBLESHOOTING GUIDE Pump not running, Water not flowing to the pad, Pad is not getting cold 1. Make sure the transformer is plugged into the wall outlet. 2. Confirm that the ice and water are filled to the indicated levels. 3. Make sure there are no kinks in the pad. 4. Gently pull on the blue tube to make sure the tube/pad junction is straight. 5. Remove the pad from the treatment site and ll it while the pad is lying at; then reapply. 6. Confirm that the pad couplings are securely attached to the unit. Listen for the double clicks (Figure  1) to confirm the pad couplings are securely attached.  Leaks    Note: Some condensation on the lines, controller, and pads is unavoidable, especially in warmer climates. 1. If using a Breg Polar Care Cold Therapy unit with a detachable Cold Therapy Pad, and a leak exists (other than condensation on the lines) disconnect the pad couplings. Make sure the silver tabs on the couplings are depressed before reconnecting the pad to the pump hose; then confirm both sides of the coupling are properly clicked in. 2. If the coupling continues to leak or a leak is detected in the pad itself, stop using it and call Breg Customer Care at [phone].  Cleaning After use, empty and dry the unit with a soft cloth. Warm water and mild detergent may be used occasionally to clean the pump and tubes.  WARNING: The Polar Care Cube can be cold enough to cause serious injury, including full skin necrosis. Follow these Operating Instructions, and carefully read the Product Insert (see pouch on side of unit) and the Cold Therapy Pad Fitting Instructions (provided with each Cold Therapy Pad) prior to use.      AMBULATORY SURGERY  DISCHARGE INSTRUCTIONS  The drugs that you were given will stay in your system until tomorrow so for the next 24 hours you should not:  Drive an automobile Make any legal decisions Drink any alcoholic beverage  You may resume regular  meals tomorrow.  Today it is better to start with liquids and gradually work up to solid foods.  You may eat anything you prefer, but it is better to start with liquids, then soup and crackers, and gradually work up to solid foods.  Please notify your doctor immediately if you have any unusual bleeding, trouble breathing, redness and pain at the surgery site, drainage, fever, or pain not relieved by medication.  Additional Instructions:  Please contact your physician with any problems or Same Day Surgery at [phone], Monday through Friday 6 am to 4 pm, or [place] at [facility] number at [phone].

## 2023-04-06 NOTE — Transfer of Care (Signed)
Immediate Anesthesia Transfer of Care Note  Patient: [name]  Procedure(s) Performed: RIGHT ARTHROSCOPY SHOULDER / EXTENSIVE DEBRIDEMENT / POSSIBLE COLLAGEN PATCH AUGMENTATION (Right: Shoulder) RIGHT SHOULDER ACROMIOPLASTY (Right: Shoulder)  Patient Location: PACU  Anesthesia Type:General  Level of Consciousness: awake and alert   Airway & Oxygen Therapy: Patient Spontanous Breathing  Post-op Assessment: Report given to RN and Post -op Vital signs reviewed and stable  Post vital signs: Reviewed and stable  Last Vitals: See pacu flow sheet for normal temp. No pain Vitals Value Taken Time  BP 98/79 04/06/23 0931  Temp    Pulse 80 04/06/23 0934  Resp 19 04/06/23 0934  SpO2 98 % 04/06/23 0934  Vitals shown include unfiled device data.  Last Pain:  Vitals:   04/06/23 0623  TempSrc: Temporal  PainSc: 0-No pain         Complications: No notable events documented.

## 2023-04-06 NOTE — Plan of Care (Signed)
 [facility] Tonsillectomy/Adenoidectomy, Postoperative PEDS care plan entered in error.

## 2023-04-06 NOTE — Op Note (Signed)
Date of Surgery: 04/06/2023  INDICATIONS: [name] is a 52 y.o.-year-old female with right shoulder rotator cuff tear.  The risk and benefits of the procedure were discussed in detail and documented in the pre-operative evaluation.   PREOPERATIVE DIAGNOSES: Right shoulder, chronic rotator cuff tear.  POSTOPERATIVE DIAGNOSIS: Same.  PROCEDURE: Arthroscopic extensive debridement - 29823 Subdeltoid Bursa, Anterior Labrum, and Superior Labrum Arthroscopic subacromial decompression - 7070766925 Arthroscopic rotator cuff repair - 84696  SURGEON: Benancio Deeds MD  ASSISTANT: Kerby Less, ATC  ANESTHESIA:  general  IV FLUIDS AND URINE: See anesthesia record.  ANTIBIOTICS: Ancef  ESTIMATED BLOOD LOSS: 10 mL.  IMPLANTS:  Implant Name Type Inv. Item Serial No. Manufacturer Lot No. LRB No. Used Action  ANCHOR JUGGERKNOT SOFT S-P - [id number] Anchor ANCHOR JUGGERKNOT SOFT S-P  ZIMMER RECON(ORTH,TRAU,BIO,SG) [id number] Right 1 Implanted  [facility] SUT KNTLS STRL SHLDR SYS - [id number] Anchor ANCH SUT KNTLS STRL SHLDR SYS  ZIMMER RECON(ORTH,TRAU,BIO,SG) [id number] Right 1 Implanted  TAPESTRY RC STS 28X28 - [id number] Orthopedic Implant TAPESTRY RC STS 28X28  ZIMMER RECON(ORTH,TRAU,BIO,SG) [id number] Right 1 Implanted  ANCHOR DUAL INSERTER - [id number] Anchor ANCHOR DUAL INSERTER  ZIMMER RECON(ORTH,TRAU,BIO,SG) [id number] Right 2 Implanted  ANCHOR DUAL INSERTER - [id number] Anchor ANCHOR DUAL INSERTER  ZIMMER RECON(ORTH,TRAU,BIO,SG) [id number] Right 1 Implanted    DRAINS: None  CULTURES: None  COMPLICATIONS: none  PROCEDURE:    OPERATIVE FINDING: Exam under anesthesia:   Examination under anesthesia revealed forward elevation of 150 degrees.  With the arm at the side, there was 65 degrees of external rotation.  There is a 1+ anterior load shift and a 1+ posterior load shift.    Arthroscopic findings demonstrated: Articular space: Intact Chondral surfaces: Normal Biceps: Intact Subscapularis:  Intact Supraspinatus: 90 articular sided tear Infraspinatus: Intact    I identified the patient in the pre-operative holding area.  I marked the operative right shoulder with my initials. I reviewed the risks and benefits of the proposed surgical intervention and the patient wished to proceed.  Anesthesia was then performed with regional block.  The patient was transferred to the operative suite and placed in the beach chair position with all bony prominences padded.     SCDs were placed on bilateral lower extremity. Appropriate antibiotics was administered within 1 hour before incision.  Anesthesia was induced.  The operative extremity was then prepped and draped in standard fashion. A time out was performed confirming the correct extremity, correct patient and correct procedure.   The arthroscope was introduced in the glenohumeral joint from a posterior portal.  An anterior portal was created.  The shoulder was examined and the above findings were noted.     With an arthroscopic shaver and a wand ablator, synovitis throughout the  shoulder was resected.  The arthroscopic shaver was used to excise torn portions of the labrum back to a stable margin. Specifically this was done for the anterior superior and posterior labrum.     Through visualization from intra-articular, the footprint of the rotator cuff was debrided of soft tissues back to bleeding bone.  This was done with an arthroscopic shaver.     The rotator cuff was then approached through the subacromial space. Anterior, lateral, and posterior portals were used.  Bursectomy was performed with an arthroscopic shaver and ArthroCare wand.  The soft tissues on the undersurface of the acromion and clavicle were resected with the arthroscopic shaver and wand. There was good excursion that was noted of the tendon back to its footprint  which would be amendable to an repair.   The rotator cuff was repaired with a double row configuration with one  medial row all suture suture anchors as noted above with sutures passed from anterior to posterior in horizontal fashion with a self passing suture device.  A total of 4 limbs of suture were passed.  The sutures were placed with a single lateral row anchor.  This provided excellent anatomic footprint approximation.  Given the quality of the tendon the decision was made to place a collagen patch.  This was introduced.  Anterior portal.  The 2 lateral staples were placed.  The inserter was removed and 2 medial row staples were placed.   The shoulder was irrigated.  The arthroscopic instruments were removed.  Wounds were closed with 3-0 nylon sutures.  A sterile dressing was applied with xeroform, 4x8s, abdominal pad, and tape. An Flonnie Hailstone was placed and the upper extremity was placed in a shoulder immobilizer.  The patient tolerated the procedure well and was taken to the recovery room in stable condition.  All counts were correct in the case. The patient tolerated the procedure well and was taken to the recovery room in stable condition.    POSTOPERATIVE PLAN: She will follow the rotator cuff repair protocol.  She will be placed in a sling and made nonweightbearing pending physical therapy.  She will be seen in 2 weeks for suture removal  Benancio Deeds, MD 9:22 AM

## 2023-04-06 NOTE — Anesthesia Procedure Notes (Signed)
Anesthesia Regional Block: Interscalene brachial plexus block   Pre-Anesthetic Checklist: , timeout performed,  Correct Patient, Correct Site, Correct Laterality,  Correct Procedure, Correct Position, site marked,  Risks and benefits discussed,  Surgical consent,  Pre-op evaluation,  At surgeon's request and post-op pain management  Laterality: Upper and Right  Prep: chloraprep       Needles:  Injection technique: Single-shot  Needle Type: Stimiplex     Needle Length: 9cm  Needle Gauge: 22     Additional Needles:   Procedures:,,,, ultrasound used (permanent image in chart),,    Narrative:  Start time: 04/06/2023 7:25 AM End time: 04/06/2023 7:30 AM Injection made incrementally with aspirations every 5 mL.  Performed by: Personally  Anesthesiologist: Foye Deer, MD  Additional Notes: Patient consented for risk and benefits of nerve block including but not limited to nerve damage, failed block, bleeding and infection.  Patient voiced understanding.  Functioning IV was confirmed and monitors were applied.  Timeout done prior to procedure and prior to any sedation being given to the patient.  Patient confirmed procedure site prior to any sedation given to the patient. Sterile prep,hand hygiene and sterile gloves were used.  Minimal sedation used for procedure.  No paresthesia endorsed by patient during the procedure.  Negative aspiration and negative test dose prior to incremental administration of local anesthetic. The patient tolerated the procedure well with no immediate complications.

## 2023-04-06 NOTE — Anesthesia Postprocedure Evaluation (Signed)
Anesthesia Post Note  Patient: WAKITA LABERGE  Procedure(s) Performed: SHOULDER ARTHROSCOPY WITH ROTATOR CUFF REPAIR , EXTENSIVE DEBRIDEMENT, AND SUBACROMIAL DECOMPRESSION  Patient location during evaluation: PACU Anesthesia Type: General Level of consciousness: awake and alert Pain management: pain level controlled Vital Signs Assessment: post-procedure vital signs reviewed and stable Respiratory status: spontaneous breathing, nonlabored ventilation and respiratory function stable Cardiovascular status: blood pressure returned to baseline and stable Postop Assessment: no apparent nausea or vomiting Anesthetic complications: no   No notable events documented.   Last Vitals:  Vitals:   04/06/23 1015 04/06/23 1043  BP: 112/75 115/71  Pulse: 80 77  Resp: 19 16  Temp: (!) 36.2 C 36.5 C  SpO2: 94% 95%    Last Pain:  Vitals:   04/06/23 1043  TempSrc: Temporal  PainSc: 0-No pain                 Foye Deer

## 2023-04-06 NOTE — H&P (Signed)
Chief Complaint: Bilateral shoulder pain        History of Present Illness:    02/15/2023: For follow-up of the left as well as right shoulder.  Presents today for follow-up predominantly for the right shoulder.  She is experiencing extremely limited motion and any type of overhead activity.     [name] is a 52 y.o. female right-hand-dominant presents with left shoulder pain that has been ongoing for 6 months now.  She has no known injury.  She does have a history of diabetes.  She states that she had previously had right shoulder rotator cuff repair and has subsequently used her left arm in order to compensate.  She is scheduled for trip to [place] the upcoming day to go see her daughter's cheer competition.  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  She is status post right shoulder rotator cuff repair done with Dr. Cleophas Dunker in 2018       Surgical History:   Right shoulder rotator cuff repair done in 2018   PMH/PSH/Family History/Social History/Meds/Allergies:         Past Medical History:  Diagnosis Date   Allergy     Anemia     Anxiety      claustrophobic   Asthma     Back pain     Biceps tendonosis of right shoulder     Cataract      Mixed OU   COVID-19      covid PNA hospitalized 2020, 06/06/21   Diabetes mellitus 2011    did not start metforfin, losing weight   Dyspnea     Fatty liver     Food allergy     Frozen shoulder      left, had steroid injection 10/05/21   Headache disorder     History of concussion     HLD (hyperlipidemia)     Hypertension     Hypertensive retinopathy      OU   IBS (irritable bowel syndrome)     Infertility, female     Joint pain     Lactose intolerance     Leg edema     Migraines     Palpitation     Post-menopausal     Seborrheic dermatitis     Shoulder impingement syndrome, right     Uveitis      [facility] [facility] in 2020 seen specialist in [place] [facility] last seen 11 or 05/2020    Vitamin D deficiency               Past Surgical History:  Procedure Laterality Date   ABDOMINAL HYSTERECTOMY   2006    heavy menses, endometriosis, l oophrectomy   BACK SURGERY       BREAST BIOPSY Right 2018    benign   BREAST EXCISIONAL BIOPSY Right 2003    benign   BREAST EXCISIONAL BIOPSY Right 1999    benign   BREAST SURGERY        right breast x 2 , benign   COLONOSCOPY   10/06/2020   HERNIA REPAIR   2003    left inguinal    LEFT OOPHORECTOMY       SHOULDER ARTHROSCOPY WITH SUBACROMIAL DECOMPRESSION AND OPEN ROTATOR C Right 07/14/2016    Procedure: RIGHT SHOULDER ARTHROSCOPY WITH SUBACROMIAL DECOMPRESSION, DISTAL CLAVICLE RESECTION AND MINI OPEN ROTATOR CUFF REPAIR, OPEN BICEP TENDODESIS;  Surgeon: Valeria Batman, MD;  Location: [facility];  Service: Orthopedics;  Laterality: Right;   SHOULDER CLOSED REDUCTION Right 09/08/2016    Procedure: RIGHT CLOSED MANIPULATION SHOULDER;  Surgeon: Valeria Batman, MD;  Location: [facility];  Service: Orthopedics;  Laterality: Right;   TUBAL LIGATION       UPPER GASTROINTESTINAL ENDOSCOPY   10/06/2020        Social History         Socioeconomic History   Marital status: Divorced      Spouse name: Not on file   Number of children: 2   Years of education: 14   Highest education level: Master's degree (e.g., MA, MS, MEng, MEd, MSW, MBA)  Occupational History   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    Right-handed.    1-3 cups caffeine weekly.    Social Determinants of Health    Financial Resource Strain: Not  on file  Food Insecurity: Not on file  Transportation Needs: Not on file  Physical Activity: Not on file  Stress: Not on file  Social Connections: Not on file         Family History  Problem Relation Age of Onset   Diabetes Mother     Heart disease Mother 29   Hypertension Mother     Hyperlipidemia Mother     Heart failure Mother     Stroke Mother     Thyroid disease Mother     Depression Mother     Sleep apnea Mother     Obesity Mother     Colon polyps Mother     Cancer Father 54        Lung Cancer   Heart disease Father     Mental illness Sister          bipolar, substance abuse,  clean 2 yrs   Hypertension Sister     Cancer Paternal Grandmother 69        breast cancer   Breast cancer Paternal Grandmother     Diabetes Maternal Grandmother     Hypertension Maternal Grandmother     Diabetes Son     Colon cancer Neg Hx     Esophageal cancer Neg Hx     Rectal cancer Neg Hx     Stomach cancer Neg Hx          Allergies       Allergies  Allergen Reactions   Bamlanivimab Anaphylaxis, Itching, Palpitations, Rash, Shortness Of Breath and Swelling   Botox [Onabotulinumtoxina] Shortness Of Breath      syncope   Clindamycin/Lincomycin Other (See Comments)   Kiwi Extract Shortness Of Breath   Maxalt [Rizatriptan Benzoate] Anaphylaxis      Chest pain   Nitrous Oxide Shortness Of Breath      syncope   Rizatriptan Benzoate Anaphylaxis      Chest pain   Triptans Shortness Of Breath   Vyepti [Eptinezumab-Jjmr] Shortness Of Breath, Palpitations and Other (See Comments)  Throat soreness, tachycardia   Aspirin Nausea And Vomiting      Mouth blisters   Contrast Media [Iodinated Contrast Media]     Erythromycin     Flagyl [Metronidazole]     Haemophilus Influenzae Vaccines     Latex        Sometimes causes rash   Mango Flavor     Vicodin [Hydrocodone-Acetaminophen]        hallucinations   Influenza Virus Vaccine Palpitations   Penicillins Nausea And Vomiting, Rash  and Other (See Comments)      Did it involve swelling of the face/tongue/throat, SOB, or low BP? No Did it involve sudden or severe rash/hives, skin peeling, or any reaction on the inside of your mouth or nose? No Did you need to seek medical attention at a hospital or doctor's office? Yes When did it last happen?     patient was 52 years old  If all above answers are "NO", may proceed with cephalosporin use.   Tetracyclines & Related Nausea And Vomiting and Rash            Current Outpatient Medications  Medication Sig Dispense Refill   acetaminophen (TYLENOL) 500 MG tablet Take 1 tablet (500 mg total) by mouth every 8 (eight) hours for 10 days. 30 tablet 0   aspirin EC 325 MG tablet Take 1 tablet (325 mg total) by mouth daily. 14 tablet 0   ibuprofen (ADVIL) 800 MG tablet Take 1 tablet (800 mg total) by mouth every 8 (eight) hours for 10 days. Please take with food, please alternate with acetaminophen 30 tablet 0   oxyCODONE (ROXICODONE) 5 MG immediate release tablet Take 1 tablet (5 mg total) by mouth every 4 (four) hours as needed for severe pain or breakthrough pain. 20 tablet 0   albuterol (VENTOLIN HFA) 108 (90 Base) MCG/ACT inhaler Inhale 2 puffs into the lungs every 6 (six) hours as needed for wheezing or shortness of breath. 18 g 0   aspirin EC 81 MG tablet Take 1 tablet (81 mg total) by mouth daily. 30 tablet 1   Atogepant (QULIPTA) 60 MG TABS Take 1 tablet (60 mg total) by mouth daily. 30 tablet 11   atorvastatin (LIPITOR) 20 MG tablet Take 1 tablet (20 mg total) by mouth daily. 90 tablet 3   Blood Glucose Monitoring Suppl (FREESTYLE LITE) DEVI 1 each by Does not apply route 2 (two) times daily. E11.9 1 each 0   Blood Pressure Monitoring (OMRON 3 SERIES BP MONITOR) DEVI Use as directed 1 each 0   butalbital-acetaminophen-caffeine (FIORICET) 50-325-40 MG tablet Take 1 tablet by mouth every 6 (six) hours as needed for headache. Do not refill in less than 30 day 30 tablet 5    Continuous Glucose Sensor (DEXCOM G7 SENSOR) MISC Apply 1 sensor every 10 days. 9 each 4   diazepam (VALIUM) 5 MG tablet Take 1 tablet (5 mg total) by mouth every 12 (twelve) hours as needed for anxiety. 30 tablet 5   dicyclomine (BENTYL) 20 MG tablet Take 1 tablet (20 mg total) by mouth every 6 (six) hours. 90 tablet 1   EPINEPHrine 0.3 mg/0.3 mL IJ SOAJ injection Inject 0.3 mg into the muscle as needed for anaphylaxis. 2 each 0   glucose blood (FREESTYLE LITE) test strip 1 each by Other route 2 (two) times daily. E11.9 200 each 0   Insulin Pen Needle (UNIFINE PENTIPS) 32G X 4 MM MISC Use 1-2x a day 200 each 4   Ipratropium-Albuterol (  COMBIVENT) 20-100 MCG/ACT AERS respimat Inhale 1 puff into the lungs every 6 (six) hours. 4 g 11   Lancets (FREESTYLE) lancets 1 each by Other route 2 (two) times daily. E11.9 200 each 0   losartan-hydrochlorothiazide (HYZAAR) 50-12.5 MG tablet Take 1 tablet by mouth daily. 90 tablet 1   meloxicam (MOBIC) 15 MG tablet Take 1 tablet (15 mg total) by mouth daily. (Patient taking differently: Take 15 mg by mouth as needed.) 30 tablet 5   metFORMIN (GLUCOPHAGE-XR) 500 MG 24 hr tablet Take 1 tablet (500 mg total) by mouth daily with supper.       metoprolol succinate (TOPROL-XL) 25 MG 24 hr tablet Take 0.5 tablets (12.5 mg total) by mouth daily. 45 tablet 3   ondansetron (ZOFRAN) 8 MG tablet Take 1 tablet (8 mg total) by mouth every 8 (eight) hours as needed for nausea or vomiting. 20 tablet 0   ondansetron (ZOFRAN-ODT) 4 MG disintegrating tablet Take 1 tablet (4 mg total) by mouth every 8 (eight) hours as needed for nausea or vomiting. 20 tablet 0   Semaglutide, 1 MG/DOSE, 4 MG/3ML SOPN Inject 1 mg into the skin once a week as directed. 9 mL 1   tiZANidine (ZANAFLEX) 2 MG tablet Take 1 tablet (2 mg total) by mouth every 6 (six) hours as needed for muscle spasms. 20 tablet 0   Ubrogepant (UBRELVY) 100 MG TABS Take 1 tablet (100 mg total) by mouth as needed. May repeat a  dose in 2 hours if needed. Max dose 2 pills in 2 hours 16 tablet 6   Vitamin D, Ergocalciferol, (DRISDOL) 1.25 MG (50000 UNIT) CAPS capsule Take 1 capsule by mouth once a week. 4 capsule 11      No current facility-administered medications for this visit.      Imaging Results (Last 48 hours)  No results found.     Review of Systems:   A ROS was performed including pertinent positives and negatives as documented in the HPI.   Physical Exam :   Constitutional: NAD and appears stated age Neurological: Alert and oriented Psych: Appropriate affect and cooperative There were no vitals taken for this visit.    Comprehensive Musculoskeletal Exam:     Musculoskeletal Exam      Inspection Right Left  Skin No atrophy or winging No atrophy or winging  Palpation      Tenderness Lateral deltoid None  Range of Motion      Flexion (passive) 50 170  Flexion (active) 50 160  Abduction 40 160  ER at the side 30 70  Can reach behind back to Side pocket T12  Strength        4/5 5/5  Special Tests      Pseudoparalytic No No  Neurologic      Fires PIN, radial, median, ulnar, musculocutaneous, axillary, suprascapular, long thoracic, and spinal accessory innervated muscles. No abnormal sensibility  Vascular/Lymphatic      Radial Pulse 2+ 2+  Cervical Exam      Patient has symmetric cervical range of motion with negative Spurling's test.  Special Test: Positive Neer impingement on the right       Lungs CTAB No m/g/r   Imaging:   Xray (views left shoulder): Normal   MRI right shoulder: Intact rotator cuff repair although this is quite thin.  There is significant fluid around the biceps tendon.  There is thickening of the inferior capsule as well as obliteration of the space.   I personally reviewed  and interpreted the radiographs.     Assessment:   52 y.o. female right-hand-dominant presents with left shoulder pain consistent with adhesive capsulitis that is now resolved.  In terms  of her right shoulder as stated that there is evidence of possible retearing with an overall tendon repair.  I believe that this is resulting in her lack of limited overhead motion.  This is somewhat difficult to tease apart from possible adhesive capsulitis of the right shoulder.  Unfortunately she did not tolerate her last steroid injection and this fluctuated her blood sugars quite unreasonably.  To this extent I do not want to perform an additional steroid injection.  This time I do believe that unfortunately given her significant loss of motion that she would benefit from right shoulder arthroscopy with lysis of adhesions, manipulation under anesthesia as well as a collagen patch augmentation.  I discussed the risk and limitations associated with this.  After discussion she has elected for this   Plan :     -Plan for right shoulder arthroscopy with lysis of adhesions, manipulation under anesthesia, collagen patch augmentation     After a lengthy discussion of treatment options, including risks, benefits, alternatives, complications of surgical and nonsurgical conservative options, the patient elected surgical repair.    The patient  is aware of the material risks  and complications including, but not limited to injury to adjacent structures, neurovascular injury, infection, numbness, bleeding, implant failure, thermal burns, stiffness, persistent pain, failure to heal, disease transmission from allograft, need for further surgery, dislocation, anesthetic risks, blood clots, risks of death,and others. The probabilities of surgical success and failure discussed with patient given their particular co-morbidities.The time and nature of expected rehabilitation and recovery was discussed.The patient's questions were all answered preoperatively.  No barriers to understanding were noted. I explained the natural history of the disease process and Rx rationale.  I explained to the patient what I considered to  be reasonable expectations given their personal situation.  The final treatment plan was arrived at through a shared patient decision making process model.         I personally saw and evaluated the patient, and participated in the management and treatment plan.   Huel Cote, MD Attending Physician, Orthopedic Surgery   This document was dictated using Dragon voice recognition software. A reasonable attempt at proof reading has been made to minimize errors.

## 2023-04-07 ENCOUNTER — Encounter (HOSPITAL_BASED_OUTPATIENT_CLINIC_OR_DEPARTMENT_OTHER): Payer: Self-pay | Admitting: Orthopaedic Surgery

## 2023-04-09 ENCOUNTER — Ambulatory Visit: Payer: Self-pay | Admitting: Orthopaedic Surgery

## 2023-04-09 ENCOUNTER — Other Ambulatory Visit: Payer: Self-pay | Admitting: Orthopaedic Surgery

## 2023-04-09 MED ORDER — MELOXICAM 15 MG PO TABS: 15.0000 mg | ORAL_TABLET | Freq: Every day | ORAL | 0 refills | Status: DC

## 2023-04-10 ENCOUNTER — Other Ambulatory Visit: Payer: Self-pay

## 2023-04-10 ENCOUNTER — Other Ambulatory Visit (HOSPITAL_COMMUNITY): Payer: Self-pay

## 2023-04-10 ENCOUNTER — Ambulatory Visit: Payer: Commercial Managed Care - PPO | Attending: Orthopaedic Surgery | Admitting: Physical Therapy

## 2023-04-10 ENCOUNTER — Encounter: Payer: Self-pay | Admitting: Physical Therapy

## 2023-04-10 ENCOUNTER — Other Ambulatory Visit: Payer: Self-pay | Admitting: Internal Medicine

## 2023-04-10 DIAGNOSIS — Z9889 Other specified postprocedural states: Secondary | ICD-10-CM | POA: Insufficient documentation

## 2023-04-10 DIAGNOSIS — M542 Cervicalgia: Secondary | ICD-10-CM | POA: Insufficient documentation

## 2023-04-10 DIAGNOSIS — S46011A Strain of muscle(s) and tendon(s) of the rotator cuff of right shoulder, initial encounter: Secondary | ICD-10-CM | POA: Insufficient documentation

## 2023-04-10 DIAGNOSIS — M25511 Pain in right shoulder: Secondary | ICD-10-CM | POA: Insufficient documentation

## 2023-04-10 NOTE — Therapy (Unsigned)
OUTPATIENT PHYSICAL THERAPY SHOULDER EVALUATION   Patient Name: [name] MRN: [id number] DOB:[birth year], 52 y.o., female Today's Date: 04/10/2023  END OF SESSION:  PT End of Session - 04/10/23 1852     Visit Number 1    Number of Visits 20    Date for PT Re-Evaluation 06/19/23    Authorization Type Lanetta Inch 2024    Authorization Time Period 25 PT/OT    Authorization - Visit Number 1    Authorization - Number of Visits 20    Progress Note Due on Visit 10    PT Start Time 1645    PT Stop Time 1730    PT Time Calculation (min) 45 min    Activity Tolerance Patient limited by pain    Behavior During Therapy [facility] for tasks assessed/performed             Past Medical History:  Diagnosis Date   Allergy    Anemia    Anxiety    claustrophobic   Asthma    Back pain    Biceps tendonosis of right shoulder    Cataract    Mixed OU   COVID-19    covid PNA hospitalized 2020, 06/06/21   Diabetes mellitus 2011   did not start metforfin, losing weight   Difficult intubation    tooth got chipped one time   Dyspnea    Essential hypertension    Fatty liver    Food allergy    Frozen shoulder    left, had steroid injection 10/05/21   Headache disorder    History of concussion    HLD (hyperlipidemia)    Hypertensive retinopathy    OU   IBS (irritable bowel syndrome)    Infertility, female    Joint pain    Lactose intolerance    Leg edema    Migraines    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   Palpitation    Post-menopausal    Seborrheic dermatitis    Shoulder impingement syndrome, right    Stage 3a chronic kidney disease (CKD) (HCC)    Type 2 diabetes mellitus with complication, with long-term current use of insulin (HCC) 2018   Uveitis    [facility] est in 2020 seen specialist in [place] [facility] last seen 11 or 05/2020   Vitamin D deficiency    Past Surgical History:  Procedure Laterality Date   ABDOMINAL HYSTERECTOMY  2006   heavy menses,  endometriosis, l oophrectomy   BREAST BIOPSY Right 2018   benign   BREAST EXCISIONAL BIOPSY Right 2003   benign   BREAST EXCISIONAL BIOPSY Right 1999   benign   COLONOSCOPY  10/06/2020   INGUINAL HERNIA REPAIR Left 2003   LEFT OOPHORECTOMY  2006   LUMBAR FUSION  2005   L5-S1   SHOULDER ARTHROSCOPY WITH ROTATOR CUFF REPAIR AND SUBACROMIAL DECOMPRESSION  04/06/2023   Procedure: SHOULDER ARTHROSCOPY WITH ROTATOR CUFF REPAIR , EXTENSIVE DEBRIDEMENT, AND SUBACROMIAL DECOMPRESSION;  Surgeon: Huel Cote, MD;  Location: [facility];  Service: Orthopedics;;   SHOULDER ARTHROSCOPY WITH SUBACROMIAL DECOMPRESSION AND OPEN ROTATOR C Right 07/14/2016   Procedure: RIGHT SHOULDER ARTHROSCOPY WITH SUBACROMIAL DECOMPRESSION, DISTAL CLAVICLE RESECTION AND MINI OPEN ROTATOR CUFF REPAIR, OPEN BICEP TENDODESIS;  Surgeon: Valeria Batman, MD;  Location: [facility];  Service: Orthopedics;  Laterality: Right;   SHOULDER CLOSED REDUCTION Right 09/08/2016   Procedure: RIGHT CLOSED MANIPULATION SHOULDER;  Surgeon: Valeria Batman, MD;  Location: [facility];  Service: Orthopedics;  Laterality: Right;   TUBAL LIGATION     UPPER GASTROINTESTINAL ENDOSCOPY  10/06/2020   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 09/21/2021   Chronic left shoulder pain 09/21/2021   Fatty infiltration of liver 07/26/2020   Diarrhea, functional 06/22/2020   Uveitis, intermediate, bilateral 09/07/2019   Generalized edema 06/13/2019   Hospital discharge follow-up 06/07/2019   Multinodular goiter 12/05/2017   Microalbuminuria due to type 2 diabetes mellitus (HCC) 11/26/2017   Encounter for preventive health examination 11/26/2017   Menopause syndrome 06/10/2017   Adverse reaction to vaccine, sequela 03/25/2017   PSVT (paroxysmal  supraventricular tachycardia) (HCC) 11/26/2016   Nontraumatic incomplete tear of right rotator cuff 07/14/2016   AC (acromioclavicular) arthritis 07/14/2016   Impingement syndrome of right shoulder 07/14/2016   Fibrocystic breast changes, right 05/21/2016   Insomnia 10/16/2015   Snoring 07/26/2015   Chronic migraine w/o aura w/o status migrainosus, not intractable 02/24/2015   History of concussion 07/21/2014   Hyperlipidemia associated with type 2 diabetes mellitus (HCC) 06/13/2014   Vitamin D deficiency 05/07/2013   Chronic migraine 11/20/2012   S/P Total Abdominal Hysterectomy and Left Salpingo-oophorectomy 10/20/2011   Headache around the eyes    Essential hypertension 07/20/2011    PCP: Dr. Duncan Dull   REFERRING PROVIDER: Dr. Huel Cote   REFERRING DIAG: Right Rotator Cuff Repair   THERAPY DIAG:  Acute pain of right shoulder  S/P right rotator cuff repair  Rationale for Evaluation and Treatment: Rehabilitation  ONSET DATE: 04/06/23  SUBJECTIVE:                                                                                                                                                                                      SUBJECTIVE STATEMENT: See pertinent history  Hand dominance: Right  PERTINENT HISTORY: Pt reports increased right shoulder pain which has been determined that it has nerve damage. This is the second right shoulder surgery and she did have PT in the past. She had RTC repair on October 31st. 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   PAIN:  Are you having pain? Yes: NPRS scale: 6/10 Pain location: Anterior shoulder and radiates down into middle fingers Pain description: Achy and gnawing  Aggravating factors: Moving Shoulder  Relieving factors: Gabapentin  and polar pack   PRECAUTIONS: Other: No Shoulder AROM, No Lifting of Objects, No Supporting of Body Weight by Hands   RED FLAGS: None  WEIGHT  BEARING RESTRICTIONS: No  FALLS:  Has patient fallen in last 6 months? No  LIVING ENVIRONMENT: Did not ask  Lives with: Not asked  Lives in: Not asked  Stairs: Not asked  Has following equipment at home: Not asked   OCCUPATION: Medical Billing Instructor   PLOF: Independent  PATIENT GOALS: Return to using right shoulder for functional activities like lifting objects and reaching overhead   NEXT MD VISIT: She is not sure   OBJECTIVE:  Note: Objective measures were completed at Evaluation unless otherwise noted.  VITALS BP 118/80  DIAGNOSTIC FINDINGS:  CLINICAL DATA:  Neck and right shoulder pain and tightness. History of right shoulder arthroscopy and subacromial decompression with distal clavicle excision and mini open rotator cuff repair.   EXAM: RIGHT SHOULDER - 2+ VIEW   COMPARISON:  Right shoulder radiographs 06/12/2017   FINDINGS: Normal bone mineralization. Normal alignment. Postsurgical changes of prior acromioplasty and distal clavicle excision. No acute fracture or dislocation. Lucent screw tracts within the greater tuberosity from prior rotator cuff repair.   IMPRESSION: Postsurgical changes of prior acromioplasty and distal clavicle excision. No acute fracture or dislocation.     Electronically Signed   By: Neita Garnet M.D.   On: 02/01/2023 15:09  PATIENT SURVEYS:  FOTO 40 with target of 60   COGNITION: Overall cognitive status: Within functional limits for tasks assessed     SENSATION: Light touch: Impaired  numbness and tingling down shoulder into right palm   POSTURE: Rounded shoulders   UPPER EXTREMITY ROM:   Active/ Passive ROM Right eval Left eval  Shoulder flexion NT  180  Shoulder extension    Shoulder abduction NT  180   Shoulder adduction    Shoulder internal rotation    Shoulder external rotation    Elbow flexion    Elbow extension    Wrist flexion    Wrist extension    Wrist ulnar deviation    Wrist radial  deviation    Wrist pronation    Wrist supination    (Blank rows = not tested)  UPPER EXTREMITY MMT:  MMT Right eval Left eval  Shoulder flexion NT 5  Shoulder extension    Shoulder abduction NT  5  Shoulder adduction    Shoulder internal rotation    Shoulder external rotation    Middle trapezius    Lower trapezius    Elbow flexion    Elbow extension    Wrist flexion    Wrist extension    Wrist ulnar deviation    Wrist radial deviation    Wrist pronation    Wrist supination    Grip strength (lbs)    (Blank rows = not tested)    PALPATION:  Not tested    TODAY'S TREATMENT:  DATE:   04/10/23: Cervical Rotation AROM Clockwise 1 x 10  Cervical Rotation AROM Counter Clockwise 1 x 10  Seated Scapular Retraction with 3 sec hold 1 x 10    PATIENT EDUCATION: Education details: Form and technique for correct performance of exercise and use of modalities such as ice to reduce inflammation  Person educated: Patient Education method: Explanation, Demonstration, Verbal cues, and Handouts Education comprehension: verbalized understanding, returned demonstration, and verbal cues required  HOME EXERCISE PROGRAM: Access Code: [email or web address] URL: [email or web address] Date: 04/10/2023 Prepared by: Ellin Goodie  Exercises - Seated Scapular Retraction  - 1 x daily - 3 sets - 5 reps - 2 sec  hold - Seated Cervical Retraction and Rotation  - 1 x daily - 10 reps  ASSESSMENT:  CLINICAL IMPRESSION: Patient is a 52 y.o. AA female who was seen today for physical therapy evaluation and treatment for arthroscopic right rotator cuff repair. She is now POD 4 and she shows decreased left shoulder ROM and strength as well as increased pain with movement. Pt will benefit from skilled PT to address these aforementioned deficits to return to reaching  overhead and lifting objects to return to driving and performing self care tasks independently.   OBJECTIVE IMPAIRMENTS: decreased ROM, decreased strength, impaired sensation, impaired UE functional use, and pain.   ACTIVITY LIMITATIONS: carrying, lifting, reach over head, and caring for others  PARTICIPATION LIMITATIONS: meal prep, cleaning, laundry, driving, shopping, community activity, and occupation  PERSONAL FACTORS: Fitness, Past/current experiences, Time since onset of injury/illness/exacerbation, and 1-2 comorbidities: HTN and HLD   are also affecting patient's functional outcome.   REHAB POTENTIAL: Fair multiple rotator cuff surgeries   CLINICAL DECISION MAKING: Stable/uncomplicated  EVALUATION COMPLEXITY: Low   GOALS: Goals reviewed with patient? No  SHORT TERM GOALS: Target date: 04/25/2023  PT reviewed the following HEP with patient with patient able to demonstrate a set of the following with min cuing for correction needed. PT educated patient on parameters of therex (how/when to inc/decrease intensity, frequency, rep/set range, stretch hold time, and purpose of therex) with verbalized understanding.  Baseline: NT  Goal status: INITIAL  2.  Patient will demonstrate right shoulder PROM flexion to >=100 degrees and abduction to >=90 degrees as evidence of tissue healing.  Baseline: NT  Goal status: INITIAL     LONG TERM GOALS: Target date: 06/20/2023  Patient will have improved function and activity level as evidenced by an increase in FOTO score by 10 points or more.  Baseline: 40  Goal status: INITIAL  2.  Patient will improve right shoulder AROM to be symmetrical with left shoulder AROM for improved RUE function to perform reaching tasks like grabbing objects off a shelf.  Baseline: Shoulder Flex R/L NT/180, Shoulder Abd R/L NT/180  Goal status: INITIAL  3.  Patient will improve right shoulder strength to  Baseline:  Shoulder Flex R/L NT/5, Shoulder Abd R/L  NT/5, Shoulder ER R/L NT/NT, Shoulder IR R/L NT/NT  Goal status: INITIAL   PLAN:  PT FREQUENCY: 1-2x/week  PT DURATION: 10 weeks  PLANNED INTERVENTIONS: 97164- PT Re-evaluation, 97110-Therapeutic exercises, 97530- Therapeutic activity, 97112- Neuromuscular re-education, 97535- Self Care, 65784- Manual therapy, 97014- Electrical stimulation (unattended), [id number]- Electrical stimulation (manual), Patient/Family education, Taping, Dry Needling, Joint mobilization, Joint manipulation, Spinal manipulation, Spinal mobilization, Scar mobilization, DME instructions, Cryotherapy, and Moist heat  PLAN FOR NEXT SESSION: Reassess shoulder ER/IR ROM and Strength. Start PROM exercises pendulums   Ellin Goodie PT, DPT  [facility]  [facility] [address], [place], [address] Phone: [phone]   Fax:  [phone]

## 2023-04-11 ENCOUNTER — Other Ambulatory Visit (HOSPITAL_COMMUNITY): Payer: Self-pay

## 2023-04-11 ENCOUNTER — Encounter (INDEPENDENT_AMBULATORY_CARE_PROVIDER_SITE_OTHER): Payer: Commercial Managed Care - PPO | Admitting: Internal Medicine

## 2023-04-11 DIAGNOSIS — E1140 Type 2 diabetes mellitus with diabetic neuropathy, unspecified: Secondary | ICD-10-CM | POA: Diagnosis not present

## 2023-04-11 NOTE — Addendum Note (Signed)
Addended by: Johnn Hai on: 04/11/2023 09:59 AM   Modules accepted: Orders

## 2023-04-12 ENCOUNTER — Ambulatory Visit: Payer: Commercial Managed Care - PPO | Attending: Internal Medicine | Admitting: Internal Medicine

## 2023-04-12 ENCOUNTER — Encounter: Payer: Self-pay | Admitting: Internal Medicine

## 2023-04-12 ENCOUNTER — Other Ambulatory Visit: Payer: Self-pay

## 2023-04-12 ENCOUNTER — Other Ambulatory Visit (HOSPITAL_COMMUNITY): Payer: Self-pay

## 2023-04-12 VITALS — BP 120/80 | HR 81 | Ht 66.0 in | Wt 200.1 lb

## 2023-04-12 DIAGNOSIS — Z794 Long term (current) use of insulin: Secondary | ICD-10-CM

## 2023-04-12 DIAGNOSIS — E1169 Type 2 diabetes mellitus with other specified complication: Secondary | ICD-10-CM

## 2023-04-12 DIAGNOSIS — I10 Essential (primary) hypertension: Secondary | ICD-10-CM | POA: Diagnosis not present

## 2023-04-12 DIAGNOSIS — E785 Hyperlipidemia, unspecified: Secondary | ICD-10-CM | POA: Diagnosis not present

## 2023-04-12 DIAGNOSIS — I471 Supraventricular tachycardia, unspecified: Secondary | ICD-10-CM

## 2023-04-12 DIAGNOSIS — R0789 Other chest pain: Secondary | ICD-10-CM

## 2023-04-12 DIAGNOSIS — R002 Palpitations: Secondary | ICD-10-CM

## 2023-04-12 MED ORDER — METFORMIN HCL ER 500 MG PO TB24
1000.0000 mg | ORAL_TABLET | Freq: Two times a day (BID) | ORAL | 0 refills | Status: DC
  Filled 2023-04-12: qty 96, 48d supply, fill #0
  Filled 2023-04-12 (×2): qty 96, 24d supply, fill #0

## 2023-04-12 MED ORDER — METFORMIN HCL ER 500 MG PO TB24: 500.0000 mg | ORAL_TABLET | Freq: Two times a day (BID) | ORAL | 0 refills | Status: DC

## 2023-04-12 NOTE — Progress Notes (Unsigned)
  Cardiology Office Note:  .   Date:  04/12/2023  ID:  [name], DOB [birth year], MRN [id number] PCP: Sherlene Shams, MD  [facility] Providers Cardiologist:  None { Click to update primary MD,subspecialty MD or APP then REFRESH:1}    History of Present Illness: [name] is a 52 y.o. female history of PSVT, HTN, DM2, COVID-19 infection in 05/2019 complicated by uveitis and fatigue, asthma, anxiety, and multiple allergies, who presents for follow-up of PSVT.  She was last seen in our office in 10/2021 by Terrilee Croak, PA, at which time Ms. Wootan reported palpitations that she attributed to labile blood sugars exacerbated by steroid injections for left shoulder pain.  No medication changes or additional testing were pursued.  Rotator cuff repair (redo) last Thursday.  Having a lot of nerve pain.  No reminder for visit.  Palpitations only when palpitations when blood sugar falls below 80.  Had severe chest pain, sitting at home; thought it was stress.  Came from epigastrum into center of chest, squeezing feeling.  Lasted ~2 minutes and then resolved on its own.  Thinks heart was racing b/c was scared and home alone.  Tongue blistered with 325 mg aspirin.  ROS: See HPI  Studies Reviewed: [name]        EP Procedures and Devices: 14-day event monitor (01/12/17): Predominantly sinus rhythm with rare PACs and PVCs. Brief atrial run lasting 4 beats.   Non-Invasive Evaluation(s): TTE (06/18/2019): Normal LV size with mild LVH.  LVEF 60-65% with grade 1 diastolic dysfunction.  Normal RV size and function.  Mild left atrial enlargement. Coronary CTA (03/23/17): No evidence of coronary artery disease. Normal coronary origins with right dominance. Coronary artery calcium score is zero. TTE (01/19/17): Normal LV size with moderate LVH. LVEF 60-65% with grade 1 diastolic dysfunction. Normal RV size and function. Trivial pericardial effusion.  Risk Assessment/Calculations:   {Does this  patient have ATRIAL FIBRILLATION?[phone]}         Physical Exam:   VS:  BP 120/80 (BP Location: Left Arm, Patient Position: Sitting, Cuff Size: Normal)   Pulse 81   Ht 5\' 6"  (1.676 m)   Wt 200 lb 2 oz (90.8 kg)   SpO2 96%   BMI 32.30 kg/m    Wt Readings from Last 3 Encounters:  04/12/23 200 lb 2 oz (90.8 kg)  04/06/23 183 lb (83 kg)  03/29/23 183 lb (83 kg)    General:  NAD. Neck: No JVD or HJR. Lungs: Clear to auscultation bilaterally without wheezes or crackles. Heart: Regular rate and rhythm without murmurs, rubs, or gallops. Abdomen: Soft, nontender, nondistended. Extremities: No lower extremity edema.  ASSESSMENT AND PLAN: .    ***    {Are you ordering a CV Procedure (e.g. stress test, cath, DCCV, TEE, etc)?   Press F2        :[id number]}  Dispo: ***  Signed, Yvonne Kendall, MD

## 2023-04-12 NOTE — Telephone Encounter (Signed)
Reviewed CGM records:    CGM interpretation: -At today's visit, we reviewed her CGM downloads: It appears that 66% of values are in target range (goal >70%), while 34% are higher than 180 (goal <25%), and 0% are lower than 70 (goal <4%).  The calculated average blood sugar is 171.  The projected HbA1c for the next 3 months (GMI) is 7.4%. -Reviewing the CGM trends, sugars appear to be fluctuating higher in the normal range, with hyperglycemic excursions after breakfast and dinner. -Sugars in the last 2 weeks have increased more significantly, most likely due to being off Ozempic due to her surgery.  She just restarted it and I suspect that the blood sugars will continue to improve, but for now, increase the metformin to 500 mg twice a day, with breakfast and dinner if possible.  She would probably benefit from an even higher dose of metformin in the morning and I will advise her to try to do this if tolerated. -Will advise her to let me know if sugars remain elevated after this.   Carlus Pavlov, MD PhD [facility] Endocr[facility]ology

## 2023-04-12 NOTE — Patient Instructions (Signed)
Medication Instructions:  Your physician recommends that you continue on your current medications as directed. Please refer to the Current Medication list given to you today.   *If you need a refill on your cardiac medications before your next appointment, please call your pharmacy*   Lab Work: No labs ordered today    Testing/Procedures: No test ordered today    Follow-Up: At [facility], you and your health needs are our priority.  As part of our continuing mission to provide you with exceptional heart care, we have created designated Provider Care Teams.  These Care Teams include your primary Cardiologist (physician) and Advanced Practice Providers (APPs -  Physician Assistants and Nurse Practitioners) who all work together to provide you with the care you need, when you need it.  We recommend signing up for the patient portal called "MyChart".  Sign up information is provided on this After Visit Summary.  MyChart is used to connect with patients for Virtual Visits (Telemedicine).  Patients are able to view lab/test results, encounter notes, upcoming appointments, etc.  Non-urgent messages can be sent to your provider as well.   To learn more about what you can do with MyChart, go to [email or web address].    Your next appointment:   1 year(s)  Provider:   You may see Yvonne Kendall, MD or one of the following Advanced Practice Providers on your designated Care Team:   Nicolasa Ducking, NP Eula Listen, PA-C Cadence Fransico Michael, PA-C Charlsie Quest, NP Carlos Levering, NP

## 2023-04-13 ENCOUNTER — Encounter: Payer: Self-pay | Admitting: Internal Medicine

## 2023-04-14 ENCOUNTER — Telehealth: Payer: Self-pay | Admitting: Orthopaedic Surgery

## 2023-04-14 NOTE — Telephone Encounter (Signed)
[employer] forms received. To [employer]. 

## 2023-04-15 ENCOUNTER — Other Ambulatory Visit (HOSPITAL_COMMUNITY): Payer: Self-pay

## 2023-04-16 ENCOUNTER — Encounter (HOSPITAL_BASED_OUTPATIENT_CLINIC_OR_DEPARTMENT_OTHER): Payer: Self-pay | Admitting: Orthopaedic Surgery

## 2023-04-17 ENCOUNTER — Telehealth: Payer: Self-pay | Admitting: Orthopaedic Surgery

## 2023-04-17 ENCOUNTER — Ambulatory Visit: Payer: Commercial Managed Care - PPO | Admitting: Physical Therapy

## 2023-04-17 ENCOUNTER — Other Ambulatory Visit: Payer: Self-pay

## 2023-04-17 DIAGNOSIS — M25511 Pain in right shoulder: Secondary | ICD-10-CM

## 2023-04-17 DIAGNOSIS — Z9889 Other specified postprocedural states: Secondary | ICD-10-CM | POA: Diagnosis not present

## 2023-04-17 DIAGNOSIS — M542 Cervicalgia: Secondary | ICD-10-CM | POA: Diagnosis not present

## 2023-04-17 DIAGNOSIS — S46011A Strain of muscle(s) and tendon(s) of the rotator cuff of right shoulder, initial encounter: Secondary | ICD-10-CM | POA: Diagnosis not present

## 2023-04-17 NOTE — Telephone Encounter (Signed)
[employer] forms received. To [employer]. 

## 2023-04-17 NOTE — Therapy (Addendum)
OUTPATIENT PHYSICAL THERAPY SHOULDER TREATMENT   Patient Name: [name] MRN: [id number] DOB:March 01, 1971, 52 y.o., female Today's Date: 04/17/2023  END OF SESSION:  PT End of Session - 04/17/23 1647     Visit Number 2    Number of Visits 20    Date for PT Re-Evaluation 06/19/23    Authorization Type Lanetta Inch 2024    Authorization Time Period 25 PT/OT    Authorization - Visit Number 2    Authorization - Number of Visits 20    Progress Note Due on Visit 10    PT Start Time 1610    PT Stop Time 1640    PT Time Calculation (min) 30 min    Activity Tolerance Patient limited by pain    Behavior During Therapy [facility] for tasks assessed/performed              Past Medical History:  Diagnosis Date   Allergy    Anemia    Anxiety    claustrophobic   Asthma    Back pain    Biceps tendonosis of right shoulder    Cataract    Mixed OU   COVID-19    covid PNA hospitalized 2020, 06/06/21   Diabetes mellitus 2011   did not start metforfin, losing weight   Difficult intubation    tooth got chipped one time   Dyspnea    Essential hypertension    Fatty liver    Food allergy    Frozen shoulder    left, had steroid injection 10/05/21   Headache disorder    History of concussion    HLD (hyperlipidemia)    Hypertensive retinopathy    OU   IBS (irritable bowel syndrome)    Infertility, female    Joint pain    Lactose intolerance    Leg edema    Migraines    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   Palpitation    Post-menopausal    Seborrheic dermatitis    Shoulder impingement syndrome, right    Stage 3a chronic kidney disease (CKD) (HCC)    Type 2 diabetes mellitus with complication, with long-term current use of insulin (HCC) 2018   Uveitis    [facility] est in 2020 seen specialist in [place] [facility] last seen 11 or 05/2020   Vitamin D deficiency    Past Surgical History:  Procedure Laterality Date   ABDOMINAL HYSTERECTOMY  2006   heavy menses,  endometriosis, l oophrectomy   BREAST BIOPSY Right 2018   benign   BREAST EXCISIONAL BIOPSY Right 2003   benign   BREAST EXCISIONAL BIOPSY Right 1999   benign   COLONOSCOPY  10/06/2020   INGUINAL HERNIA REPAIR Left 2003   LEFT OOPHORECTOMY  2006   LUMBAR FUSION  2005   L5-S1   SHOULDER ARTHROSCOPY WITH ROTATOR CUFF REPAIR AND SUBACROMIAL DECOMPRESSION  04/06/2023   Procedure: SHOULDER ARTHROSCOPY WITH ROTATOR CUFF REPAIR , EXTENSIVE DEBRIDEMENT, AND SUBACROMIAL DECOMPRESSION;  Surgeon: Huel Cote, MD;  Location: [facility];  Service: Orthopedics;;   SHOULDER ARTHROSCOPY WITH SUBACROMIAL DECOMPRESSION AND OPEN ROTATOR C Right 07/14/2016   Procedure: RIGHT SHOULDER ARTHROSCOPY WITH SUBACROMIAL DECOMPRESSION, DISTAL CLAVICLE RESECTION AND MINI OPEN ROTATOR CUFF REPAIR, OPEN BICEP TENDODESIS;  Surgeon: Valeria Batman, MD;  Location: [facility];  Service: Orthopedics;  Laterality: Right;   SHOULDER CLOSED REDUCTION Right 09/08/2016   Procedure: RIGHT CLOSED MANIPULATION SHOULDER;  Surgeon: Valeria Batman, MD;  Location: [facility];  Service: Orthopedics;  Laterality: Right;   TUBAL LIGATION     UPPER GASTROINTESTINAL ENDOSCOPY  10/06/2020   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 09/21/2021   Chronic left shoulder pain 09/21/2021   Fatty infiltration of liver 07/26/2020   Diarrhea, functional 06/22/2020   Uveitis, intermediate, bilateral 09/07/2019   Generalized edema 06/13/2019   Hospital discharge follow-up 06/07/2019   Multinodular goiter 12/05/2017   Microalbuminuria due to type 2 diabetes mellitus (HCC) 11/26/2017   Encounter for preventive health examination 11/26/2017   Menopause syndrome 06/10/2017   Adverse reaction to vaccine, sequela 03/25/2017   Atypical chest pain 02/27/2017    PSVT (paroxysmal supraventricular tachycardia) (HCC) 11/26/2016   Nontraumatic incomplete tear of right rotator cuff 07/14/2016   AC (acromioclavicular) arthritis 07/14/2016   Impingement syndrome of right shoulder 07/14/2016   Fibrocystic breast changes, right 05/21/2016   Insomnia 10/16/2015   Snoring 07/26/2015   Chronic migraine w/o aura w/o status migrainosus, not intractable 02/24/2015   History of concussion 07/21/2014   Hyperlipidemia associated with type 2 diabetes mellitus (HCC) 06/13/2014   Vitamin D deficiency 05/07/2013   Chronic migraine 11/20/2012   S/P Total Abdominal Hysterectomy and Left Salpingo-oophorectomy 10/20/2011   Headache around the eyes    Essential hypertension 07/20/2011    PCP: Dr. Duncan Dull   REFERRING PROVIDER: Dr. Huel Cote   REFERRING DIAG: Right Rotator Cuff Repair   THERAPY DIAG:  Acute pain of right shoulder  S/P right rotator cuff repair  Rationale for Evaluation and Treatment: Rehabilitation  ONSET DATE: 04/06/23  SUBJECTIVE:                                                                                                                                                                                      SUBJECTIVE STATEMENT:  Pt reports that she continues to feel increase nerve pain in her right hand that is making it harder to bend her fingers.  See pertinent history  Hand dominance: Right  PERTINENT HISTORY: Pt reports increased right shoulder pain which has been determined that it has nerve damage. This is the second right shoulder surgery and she did have PT in the past. She had RTC repair on October 31st. 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   PAIN:  Are you having pain? Yes: NPRS scale: 6/10 Pain location: Anterior shoulder and radiates down into middle fingers Pain description: Achy and gnawing  Aggravating factors: Moving Shoulder  Relieving factors: Gabapentin  and  polar pack   PRECAUTIONS: Other: No Shoulder AROM, No Lifting of Objects, No Supporting of Body Weight by Hands   RED FLAGS: None   WEIGHT BEARING RESTRICTIONS: No  FALLS:  Has patient fallen in last 6 months? No  LIVING ENVIRONMENT: Did not ask  Lives with: Not asked  Lives in: Not asked  Stairs: Not asked  Has following equipment at home: Not asked   OCCUPATION: Medical Billing Instructor   PLOF: Independent  PATIENT GOALS: Return to using right shoulder for functional activities like lifting objects and reaching overhead   NEXT MD VISIT: She is not sure   OBJECTIVE:  Note: Objective measures were completed at Evaluation unless otherwise noted.  VITALS BP 118/80  DIAGNOSTIC FINDINGS:  CLINICAL DATA:  Neck and right shoulder pain and tightness. History of right shoulder arthroscopy and subacromial decompression with distal clavicle excision and mini open rotator cuff repair.   EXAM: RIGHT SHOULDER - 2+ VIEW   COMPARISON:  Right shoulder radiographs 06/12/2017   FINDINGS: Normal bone mineralization. Normal alignment. Postsurgical changes of prior acromioplasty and distal clavicle excision. No acute fracture or dislocation. Lucent screw tracts within the greater tuberosity from prior rotator cuff repair.   IMPRESSION: Postsurgical changes of prior acromioplasty and distal clavicle excision. No acute fracture or dislocation.     Electronically Signed   By: Neita Garnet M.D.   On: 02/01/2023 15:09  PATIENT SURVEYS:  FOTO 40 with target of 60   COGNITION: Overall cognitive status: Within functional limits for tasks assessed     SENSATION: Light touch: Impaired  numbness and tingling down shoulder into right palm   POSTURE: Rounded shoulders   UPPER EXTREMITY ROM:   Active/ Passive ROM Right eval Left eval  Shoulder flexion NT  180  Shoulder extension    Shoulder abduction NT  180   Shoulder adduction    Shoulder internal rotation    Shoulder  external rotation    Elbow flexion    Elbow extension    Wrist flexion    Wrist extension    Wrist ulnar deviation    Wrist radial deviation    Wrist pronation    Wrist supination    (Blank rows = not tested)  UPPER EXTREMITY MMT:  MMT Right eval Left eval  Shoulder flexion NT 5  Shoulder extension    Shoulder abduction NT  5  Shoulder adduction    Shoulder internal rotation    Shoulder external rotation    Middle trapezius    Lower trapezius    Elbow flexion    Elbow extension    Wrist flexion    Wrist extension    Wrist ulnar deviation    Wrist radial deviation    Wrist pronation    Wrist supination    Grip strength (lbs)    (Blank rows = not tested)    PALPATION:  Not tested    TODAY'S TREATMENT:  DATE:   04/17/23: All single UE exercises performed on right  Shoulder pendulums flexion and extension on left 3 x 10  Shoulder pendulums adduction and abduction on left 3 x 10  Supine Shoulder Retractions 5 sec hold 1 x 10  Shoulder ER/IR at 0 deg abduction 3 x 10  Shoulder AAROM Flex and Ext on Pulleys x 2  -Pt unable to perform due to pain   04/10/23: Cervical Rotation AROM Clockwise 1 x 10  Cervical Rotation AROM Counter Clockwise 1 x 10  Seated Scapular Retraction with 3 sec hold 1 x 10    PATIENT EDUCATION: Education details: Form and technique for correct performance of exercise and use of modalities such as ice to reduce inflammation  Person educated: Patient Education method: Programmer, multimedia, Demonstration, Verbal cues, and Handouts Education comprehension: verbalized understanding, returned demonstration, and verbal cues required  HOME EXERCISE PROGRAM: Access Code: [email or web address] URL: [email or web address] Date: 04/17/2023 Prepared by: Ellin Goodie  Exercises - Seated Upper Trapezius Stretch  - 3-4 x  weekly - 3 reps - 30 sec hold - Standing Horizontal Shoulder Pendulum Supported with Arm Bent  - 3-4 x weekly - 1 sets - 30 reps - Standing Flexion Extension Shoulder Pendulum Supported with Arm Bent  - 1 x daily - 1 sets - 30 reps - Supine Scapular Retraction  - 3-4 x weekly - 3 sets - 5 reps - 5 sec  hold - Seated Shoulder ER and IR AAROM wiht cane   - 3-4 x weekly - 3 sets - 5 reps   ASSESSMENT:  CLINICAL IMPRESSION: Pt presents for follow up s/p 11 days post op for right rotator cuff repair. She continues to be severely limited by left shoulder pain that is radiating down into three middle fingers. Pt able to demonstrate independent with AAROM exercises. Pt will benefit from skilled PT to address these aforementioned deficits to return to reaching overhead and lifting objects to return to driving and performing self care tasks independently.    OBJECTIVE IMPAIRMENTS: decreased ROM, decreased strength, impaired sensation, impaired UE functional use, and pain.   ACTIVITY LIMITATIONS: carrying, lifting, reach over head, and caring for others  PARTICIPATION LIMITATIONS: meal prep, cleaning, laundry, driving, shopping, community activity, and occupation  PERSONAL FACTORS: Fitness, Past/current experiences, Time since onset of injury/illness/exacerbation, and 1-2 comorbidities: HTN and HLD   are also affecting patient's functional outcome.   REHAB POTENTIAL: Fair multiple rotator cuff surgeries   CLINICAL DECISION MAKING: Stable/uncomplicated  EVALUATION COMPLEXITY: Low   GOALS: Goals reviewed with patient? No  SHORT TERM GOALS: Target date: 04/25/2023  PT reviewed the following HEP with patient with patient able to demonstrate a set of the following with min cuing for correction needed. PT educated patient on parameters of therex (how/when to inc/decrease intensity, frequency, rep/set range, stretch hold time, and purpose of therex) with verbalized understanding.  Baseline: NT  04/17/23: Performing independently  Goal status: ONGOING   2.  Patient will demonstrate right shoulder PROM flexion to >=100 degrees and abduction to >=90 degrees as evidence of tissue healing.  Baseline: Shoulder Flex R/L NT/180, Shoulder Abd R/L NT/180  Goal status: ONGOING      LONG TERM GOALS: Target date: 06/20/2023  Patient will have improved function and activity level as evidenced by an increase in FOTO score by 10 points or more.  Baseline: 40  Goal status: ONGOING   2.  Patient will improve right shoulder AROM to be symmetrical with left shoulder AROM for improved RUE  function to perform reaching tasks like grabbing objects off a shelf.  Baseline: Shoulder Flex R/L NT/180, Shoulder Abd R/L NT/180  Goal status: ONGOING   3.  Patient will improve right shoulder strength to  Baseline:  Shoulder Flex R/L NT/5, Shoulder Abd R/L NT/5, Shoulder ER R/L NT/NT, Shoulder IR R/L NT/NT  Goal status: ONGOING    PLAN:  PT FREQUENCY: 1-2x/week  PT DURATION: 10 weeks  PLANNED INTERVENTIONS: 97164- PT Re-evaluation, 97110-Therapeutic exercises, 97530- Therapeutic activity, 97112- Neuromuscular re-education, 97535- Self Care, 16109- Manual therapy, 97014- Electrical stimulation (unattended), [phone]- Electrical stimulation (manual), Patient/Family education, Taping, Dry Needling, Joint mobilization, Joint manipulation, Spinal manipulation, Spinal mobilization, Scar mobilization, DME instructions, Cryotherapy, and Moist heat  PLAN FOR NEXT SESSION: Pulleys and ranging of joints   Ellin Goodie PT, DPT  [facility] [address], [place], [address] Phone: [phone]   Fax:  [phone]

## 2023-04-19 ENCOUNTER — Ambulatory Visit (HOSPITAL_BASED_OUTPATIENT_CLINIC_OR_DEPARTMENT_OTHER): Payer: Commercial Managed Care - PPO | Admitting: Orthopaedic Surgery

## 2023-04-19 ENCOUNTER — Ambulatory Visit: Payer: Commercial Managed Care - PPO | Admitting: Physical Therapy

## 2023-04-19 DIAGNOSIS — S46011A Strain of muscle(s) and tendon(s) of the rotator cuff of right shoulder, initial encounter: Secondary | ICD-10-CM

## 2023-04-19 NOTE — Progress Notes (Signed)
Post Operative Evaluation    Procedure/Date of Surgery:  right rotator cuff repair 10/31  Interval History:    Presents today 2 weeks status post right shoulder rotator cuff repair.  Overall she is doing well.  She is having some paresthesias in the middle finger of the right hand.  Denies any history of cervical radiculopathy.  I did start her on Mobic and she is getting some relief from this.   PMH/PSH/Family History/Social History/Meds/Allergies:    Past Medical History:  Diagnosis Date   Allergy    Anemia    Anxiety    claustrophobic   Asthma    Back pain    Biceps tendonosis of right shoulder    Cataract    Mixed OU   COVID-19    covid PNA hospitalized 2020, 06/06/21   Diabetes mellitus 2011   did not start metforfin, losing weight   Difficult intubation    tooth got chipped one time   Dyspnea    Essential hypertension    Fatty liver    Food allergy    Frozen shoulder    left, had steroid injection 10/05/21   Headache disorder    History of concussion    HLD (hyperlipidemia)    Hypertensive retinopathy    OU   IBS (irritable bowel syndrome)    Infertility, female    Joint pain    Lactose intolerance    Leg edema    Migraines    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   Palpitation    Post-menopausal    Seborrheic dermatitis    Shoulder impingement syndrome, right    Stage 3a chronic kidney disease (CKD) (HCC)    Type 2 diabetes mellitus with complication, with long-term current use of insulin (HCC) 2018   Uveitis    [facility] est in 2020 seen specialist in [place] [facility] last seen 11 or 05/2020   Vitamin D deficiency    Past Surgical History:  Procedure Laterality Date   ABDOMINAL HYSTERECTOMY  2006   heavy menses, endometriosis, l oophrectomy   BREAST BIOPSY Right 2018   benign   BREAST EXCISIONAL BIOPSY Right 2003   benign   BREAST EXCISIONAL BIOPSY Right 1999   benign   COLONOSCOPY   10/06/2020   INGUINAL HERNIA REPAIR Left 2003   LEFT OOPHORECTOMY  2006   LUMBAR FUSION  2005   L5-S1   SHOULDER ARTHROSCOPY WITH ROTATOR CUFF REPAIR AND SUBACROMIAL DECOMPRESSION  04/06/2023   Procedure: SHOULDER ARTHROSCOPY WITH ROTATOR CUFF REPAIR , EXTENSIVE DEBRIDEMENT, AND SUBACROMIAL DECOMPRESSION;  Surgeon: Huel Cote, MD;  Location: [facility];  Service: Orthopedics;;   SHOULDER ARTHROSCOPY WITH SUBACROMIAL DECOMPRESSION AND OPEN ROTATOR C Right 07/14/2016   Procedure: RIGHT SHOULDER ARTHROSCOPY WITH SUBACROMIAL DECOMPRESSION, DISTAL CLAVICLE RESECTION AND MINI OPEN ROTATOR CUFF REPAIR, OPEN BICEP TENDODESIS;  Surgeon: Valeria Batman, MD;  Location: [facility];  Service: Orthopedics;  Laterality: Right;   SHOULDER CLOSED REDUCTION Right 09/08/2016   Procedure: RIGHT CLOSED MANIPULATION SHOULDER;  Surgeon: Valeria Batman, MD;  Location:  SUR[facility]ERY [facility];  Service: Orthopedics;  Laterality: Right;   TUBAL LIGATION     UPPER GASTROINTESTINAL ENDOSCOPY  10/06/2020   Social History   Socioeconomic History   Marital status: Divorced    Spouse name: Not  on file   Number of children: 2   Years of education: 8   Highest education level: Master's degree (e.g., MA, MS, MEng, MEd, MSW, MBA)  Occupational History   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   Right-handed.   1-3 cups caffeine weekly.   Social Determinants of Health   Financial Resource Strain: Not on file  Food Insecurity: Not on file  Transportation Needs: Not on file  Physical Activity: Not on file  Stress: Not  on file  Social Connections: Not on file   Family History  Problem Relation Age of Onset   Diabetes Mother    Heart disease Mother 50   Hypertension Mother    Hyperlipidemia Mother    Heart failure Mother    Stroke Mother    Thyroid disease Mother    Depression Mother    Sleep apnea Mother    Obesity Mother    Colon polyps Mother    Cancer Father 70       Lung Cancer   Heart disease Father    Mental illness Sister        bipolar, substance abuse,  clean 2 yrs   Hypertension Sister    Cancer Paternal Grandmother 68       breast cancer   Breast cancer Paternal Grandmother    Diabetes Maternal Grandmother    Hypertension Maternal Grandmother    Diabetes Son    Colon cancer Neg Hx    Esophageal cancer Neg Hx    Rectal cancer Neg Hx    Stomach cancer Neg Hx    Allergies  Allergen Reactions   Bamlanivimab Anaphylaxis, Itching, Palpitations, Rash, Shortness Of Breath and Swelling   Botox [Onabotulinumtoxina] Shortness Of Breath    syncope   Clindamycin/Lincomycin Other (See Comments)   Contrast Media [Iodinated Contrast Media] Shortness Of Breath and Palpitations   Kiwi Extract Shortness Of Breath   Maxalt [Rizatriptan Benzoate] Anaphylaxis    Chest pain   Nitrous Oxide Shortness Of Breath    syncope   Rizatriptan Benzoate Anaphylaxis    Chest pain   Triptans Shortness Of Breath   Vyepti [Eptinezumab-Jjmr] Shortness Of Breath, Palpitations and Other (See Comments)    Throat soreness, tachycardia   Aspirin Nausea And Vomiting    Mouth blisters   Haemophilus Influenzae Vaccines    Latex     Sometimes causes rash, welts   Mango Flavor Swelling    Lips swell   Vicodin [Hydrocodone-Acetaminophen]     hallucinations   Erythromycin Rash   Flagyl [Metronidazole] Rash   Influenza Virus Vaccine Palpitations   Penicillins Nausea And Vomiting, Rash and Other (See Comments)    Did it involve swelling of the face/tongue/throat, SOB, or low BP? No Did it involve sudden or  severe rash/hives, skin peeling, or any reaction on the inside of your mouth or nose? No Did you need to seek medical attention at a hospital or doctor's office? Yes When did it last happen?     patient was 52 years old  If all above answers are "NO", may proceed with cephalosporin use.   Tetracyclines & Related Nausea And Vomiting and Rash   Current Outpatient Medications  Medication Sig Dispense Refill   albuterol (VENTOLIN HFA) 108 (90 Base) MCG/ACT inhaler Inhale 2 puffs into the lungs every 6 (six) hours as needed for wheezing or shortness of breath. 18 g 0   aspirin EC 81 MG tablet Take 81 mg by mouth daily. Swallow whole.     Atogepant (QULIPTA) 60 MG TABS Take 1 tablet (60 mg total) by mouth daily. (Patient taking differently: Take 1 tablet by mouth at bedtime.) 30 tablet 11   atorvastatin (LIPITOR) 20 MG tablet Take 1 tablet (20 mg total) by mouth daily. (Patient taking differently: Take 20 mg by mouth at bedtime.) 90 tablet 3   butalbital-acetaminophen-caffeine (FIORICET) 50-325-40 MG tablet Take 1 tablet by mouth every 6 (six) hours as needed for headache. Do not refill in less than 30 day 30 tablet 5   Continuous Glucose Sensor (DEXCOM G7 SENSOR) MISC Apply 1 sensor every 10 days. (Patient not taking: Reported on 04/12/2023) 9 each 4   diazepam (VALIUM) 5 MG tablet Take 1 tablet (5 mg total) by mouth every 12 (twelve) hours as needed for anxiety. (Patient taking differently: Take 5 mg by mouth every 12 (twelve) hours as needed (migraines).) 30 tablet 5   dicyclomine (BENTYL) 20 MG tablet Take 1 tablet (20 mg total) by mouth every 6 (six) hours. (Patient taking differently: Take 20 mg by mouth 3 (three) times daily as needed.) 90 tablet 1   EPINEPHrine 0.3 mg/0.3 mL IJ SOAJ injection Inject 0.3 mg into the muscle as needed for anaphylaxis. (Patient not taking: Reported on 04/12/2023) 2 each 0   fluconazole (DIFLUCAN) 150 MG tablet Take 1 tablet (150 mg total) by mouth daily. 5 tablet 0    Ipratropium-Albuterol (COMBIVENT) 20-100 MCG/ACT AERS respimat Inhale 1 puff into the lungs every 6 (six) hours. (Patient taking differently: Inhale 1 puff into the lungs every 6 (six) hours as needed for shortness of breath.) 4 g 11   losartan-hydrochlorothiazide (HYZAAR) 50-12.5 MG tablet Take 1 tablet by mouth daily. (Patient taking differently: Take 1 tablet by mouth at bedtime.) 90 tablet 1   meloxicam (MOBIC) 15 MG tablet Take 1 tablet (15 mg total) by mouth daily. 14 tablet 0   metFORMIN (GLUCOPHAGE-XR) 500 MG 24 hr tablet Take 2 tablets (1,000 mg total) by mouth 2 (two) times daily with a meal. 1000mg  at breakfast and 1000 mg at dinner 96 tablet 0   metoprolol succinate (TOPROL-XL) 25 MG 24 hr tablet Take 0.5 tablets (12.5 mg total) by mouth daily. (Patient taking differently: Take 12.5 mg by mouth at bedtime.) 45 tablet 0   ondansetron (ZOFRAN-ODT) 4 MG disintegrating tablet Take 1 tablet (4 mg total) by mouth every 8 (eight) hours as needed for nausea or vomiting. 20 tablet 0   oxyCODONE (ROXICODONE) 5 MG immediate release tablet Take 1 tablet (5 mg total) by mouth every 4 (four) hours as needed for severe pain (pain score 7-10) or breakthrough pain. 15 tablet 0   Semaglutide, 1 MG/DOSE, 4 MG/3ML SOPN Inject 1 mg into the skin once a week as directed. (Patient taking differently: Inject 1 mg as directed once a week. Sunday) 9 mL 1   Ubrogepant (UBRELVY) 100 MG TABS Take 1 tablet (100 mg total) by mouth as needed. May repeat dose in 2 hours if needed. Max dose 2 tablets in 2 hours 16  tablet 11   Vitamin D, Ergocalciferol, (DRISDOL) 1.25 MG (50000 UNIT) CAPS capsule Take 1 capsule by mouth once a week. (Patient taking differently: Take 50,000 Units by mouth once a week. Sunday) 4 capsule 11   No current facility-administered medications for this visit.   No results found.  Review of Systems:   A ROS was performed including pertinent positives and negatives as documented in the  HPI.   Musculoskeletal Exam:    There were no vitals taken for this visit.  Right shoulder incisions are well-appearing without erythema or drainage.  She does have paresthesias down the middle finger and ring finger of the right hand.  She is able to fire wrist extensors as well as thumb extensor.  She is able to fire at the biceps.  Remainder of sensory exam is intact.  Imaging:      I personally reviewed and interpreted the radiographs.   Assessment:   2 weeks status post right shoulder rotator cuff repair.  I do believe she does have some cervical radiculopathy type symptoms at today's visit.  At this time she is on a Medrol Dosepak and I did recommend the cervical traction pillow for her.  She has been working on her neck physical therapy.  She will continue to follow along the rotator cuff repair protocol.  I will plan to see her back in 4 weeks for reassessment  Plan :    -Return to clinic 4 weeks for reassessment      I personally saw and evaluated the patient, and participated in the management and treatment plan.  Huel Cote, MD Attending Physician, Orthopedic Surgery  This document was dictated using Dragon voice recognition software. A reasonable attempt at proof reading has been made to minimize errors.

## 2023-04-24 ENCOUNTER — Ambulatory Visit: Payer: Commercial Managed Care - PPO | Admitting: Physical Therapy

## 2023-04-24 DIAGNOSIS — E119 Type 2 diabetes mellitus without complications: Secondary | ICD-10-CM | POA: Diagnosis not present

## 2023-04-24 DIAGNOSIS — Z9889 Other specified postprocedural states: Secondary | ICD-10-CM | POA: Diagnosis not present

## 2023-04-24 DIAGNOSIS — M542 Cervicalgia: Secondary | ICD-10-CM | POA: Diagnosis not present

## 2023-04-24 DIAGNOSIS — S46011A Strain of muscle(s) and tendon(s) of the rotator cuff of right shoulder, initial encounter: Secondary | ICD-10-CM | POA: Diagnosis not present

## 2023-04-24 DIAGNOSIS — M25511 Pain in right shoulder: Secondary | ICD-10-CM | POA: Diagnosis not present

## 2023-04-24 LAB — HM DIABETES EYE EXAM

## 2023-04-24 NOTE — Therapy (Signed)
OUTPATIENT PHYSICAL THERAPY SHOULDER TREATMENT   Patient Name: [name] MRN: [id number] DOB:16-Sep-1970, 52 y.o., female Today's Date: 04/24/2023  END OF SESSION:  PT End of Session - 04/24/23 1730     Visit Number 3    Number of Visits 20    Date for PT Re-Evaluation 06/19/23    Authorization Type Lanetta Inch 2024    Authorization Time Period 25 PT/OT    Authorization - Visit Number 3    Authorization - Number of Visits 20    Progress Note Due on Visit 10    PT Start Time 1645    PT Stop Time 1725    PT Time Calculation (min) 40 min    Activity Tolerance Patient tolerated treatment well    Behavior During Therapy WFL for tasks assessed/performed               Past Medical History:  Diagnosis Date   Allergy    Anemia    Anxiety    claustrophobic   Asthma    Back pain    Biceps tendonosis of right shoulder    Cataract    Mixed OU   COVID-19    covid PNA hospitalized 2020, 06/06/21   Diabetes mellitus 2011   did not start metforfin, losing weight   Difficult intubation    tooth got chipped one time   Dyspnea    Essential hypertension    Fatty liver    Food allergy    Frozen shoulder    left, had steroid injection 10/05/21   Headache disorder    History of concussion    HLD (hyperlipidemia)    Hypertensive retinopathy    OU   IBS (irritable bowel syndrome)    Infertility, female    Joint pain    Lactose intolerance    Leg edema    Migraines    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   Palpitation    Post-menopausal    Seborrheic dermatitis    Shoulder impingement syndrome, right    Stage 3a chronic kidney disease (CKD) (HCC)    Type 2 diabetes mellitus with complication, with long-term current use of insulin (HCC) 2018   Uveitis    [place] [facility] est in 2020 seen specialist in [place] [facility] last seen 11 or 05/2020   Vitamin D deficiency    Past Surgical History:  Procedure Laterality Date   ABDOMINAL HYSTERECTOMY  2006    heavy menses, endometriosis, l oophrectomy   BREAST BIOPSY Right 2018   benign   BREAST EXCISIONAL BIOPSY Right 2003   benign   BREAST EXCISIONAL BIOPSY Right 1999   benign   COLONOSCOPY  10/06/2020   INGUINAL HERNIA REPAIR Left 2003   LEFT OOPHORECTOMY  2006   LUMBAR FUSION  2005   L5-S1   SHOULDER ARTHROSCOPY WITH ROTATOR CUFF REPAIR AND SUBACROMIAL DECOMPRESSION  04/06/2023   Procedure: SHOULDER ARTHROSCOPY WITH ROTATOR CUFF REPAIR , EXTENSIVE DEBRIDEMENT, AND SUBACROMIAL DECOMPRESSION;  Surgeon: Huel Cote, MD;  Location: [facility];  Service: Orthopedics;;   SHOULDER ARTHROSCOPY WITH SUBACROMIAL DECOMPRESSION AND OPEN ROTATOR C Right 07/14/2016   Procedure: RIGHT SHOULDER ARTHROSCOPY WITH SUBACROMIAL DECOMPRESSION, DISTAL CLAVICLE RESECTION AND MINI OPEN ROTATOR CUFF REPAIR, OPEN BICEP TENDODESIS;  Surgeon: Valeria Batman, MD;  Location: [facility];  Service: Orthopedics;  Laterality: Right;   SHOULDER CLOSED REDUCTION Right 09/08/2016   Procedure: RIGHT CLOSED MANIPULATION SHOULDER;  Surgeon: Valeria Batman, MD;  Location: [facility];  Service: Orthopedics;  Laterality: Right;   TUBAL LIGATION     UPPER GASTROINTESTINAL ENDOSCOPY  10/06/2020   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 09/21/2021   Chronic left shoulder pain 09/21/2021   Fatty infiltration of liver 07/26/2020   Diarrhea, functional 06/22/2020   Uveitis, intermediate, bilateral 09/07/2019   Generalized edema 06/13/2019   Hospital discharge follow-up 06/07/2019   Multinodular goiter 12/05/2017   Microalbuminuria due to type 2 diabetes mellitus (HCC) 11/26/2017   Encounter for preventive health examination 11/26/2017   Menopause syndrome 06/10/2017   Adverse reaction to vaccine, sequela 03/25/2017   Atypical chest  pain 02/27/2017   PSVT (paroxysmal supraventricular tachycardia) (HCC) 11/26/2016   Nontraumatic incomplete tear of right rotator cuff 07/14/2016   AC (acromioclavicular) arthritis 07/14/2016   Impingement syndrome of right shoulder 07/14/2016   Fibrocystic breast changes, right 05/21/2016   Insomnia 10/16/2015   Snoring 07/26/2015   Chronic migraine w/o aura w/o status migrainosus, not intractable 02/24/2015   History of concussion 07/21/2014   Hyperlipidemia associated with type 2 diabetes mellitus (HCC) 06/13/2014   Vitamin D deficiency 05/07/2013   Chronic migraine 11/20/2012   S/P Total Abdominal Hysterectomy and Left Salpingo-oophorectomy 10/20/2011   Headache around the eyes    Essential hypertension 07/20/2011    PCP: Dr. Duncan Dull   REFERRING PROVIDER: Dr. Huel Cote   REFERRING DIAG: Right Rotator Cuff Repair   THERAPY DIAG:  Acute pain of right shoulder  S/P right rotator cuff repair  Cervicalgia  Rationale for Evaluation and Treatment: Rehabilitation  ONSET DATE: 04/06/23  SUBJECTIVE:                                                                                                                                                                                      SUBJECTIVE STATEMENT:  Pt reports that she continues to feel increase nerve pain in her right hand. Less irritable with finger flexion right now, but has sharp pain in shoulder. Right hand fingers are always numb but sometimes feel "shocks"  See pertinent history  Hand dominance: Right  PERTINENT HISTORY: Pt reports increased right shoulder pain which has been determined that it has nerve damage. This is the second right shoulder surgery and she did have PT in the past. She had RTC repair on October 31st. 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   PAIN:  Are you having pain? Yes: NPRS scale: 6/10 Pain location: Anterior  shoulder and radiates down into  middle fingers Pain description: Achy and gnawing  Aggravating factors: Moving Shoulder  Relieving factors: Gabapentin  and polar pack   PRECAUTIONS: Other: No Shoulder AROM, No Lifting of Objects, No Supporting of Body Weight by Hands   RED FLAGS: None   WEIGHT BEARING RESTRICTIONS: No  FALLS:  Has patient fallen in last 6 months? No  LIVING ENVIRONMENT: Did not ask  Lives with: Not asked  Lives in: Not asked  Stairs: Not asked  Has following equipment at home: Not asked   OCCUPATION: Medical Billing Instructor   PLOF: Independent  PATIENT GOALS: Return to using right shoulder for functional activities like lifting objects and reaching overhead   NEXT MD VISIT: She is not sure   OBJECTIVE:  Note: Objective measures were completed at Evaluation unless otherwise noted.  VITALS BP 118/80  DIAGNOSTIC FINDINGS:  CLINICAL DATA:  Neck and right shoulder pain and tightness. History of right shoulder arthroscopy and subacromial decompression with distal clavicle excision and mini open rotator cuff repair.   EXAM: RIGHT SHOULDER - 2+ VIEW   COMPARISON:  Right shoulder radiographs 06/12/2017   FINDINGS: Normal bone mineralization. Normal alignment. Postsurgical changes of prior acromioplasty and distal clavicle excision. No acute fracture or dislocation. Lucent screw tracts within the greater tuberosity from prior rotator cuff repair.   IMPRESSION: Postsurgical changes of prior acromioplasty and distal clavicle excision. No acute fracture or dislocation.     Electronically Signed   By: Neita Garnet M.D.   On: 02/01/2023 15:09  PATIENT SURVEYS:  FOTO 40 with target of 60   COGNITION: Overall cognitive status: Within functional limits for tasks assessed     SENSATION: Light touch: Impaired  numbness and tingling down shoulder into right palm   POSTURE: Rounded shoulders   UPPER EXTREMITY ROM:   Active/ Passive ROM Right eval Left eval  Shoulder  flexion NT  180  Shoulder extension    Shoulder abduction NT  180   Shoulder adduction    Shoulder internal rotation    Shoulder external rotation    Elbow flexion    Elbow extension    Wrist flexion    Wrist extension    Wrist ulnar deviation    Wrist radial deviation    Wrist pronation    Wrist supination    (Blank rows = not tested)  UPPER EXTREMITY MMT:  MMT Right eval Left eval  Shoulder flexion NT 5  Shoulder extension    Shoulder abduction NT  5  Shoulder adduction    Shoulder internal rotation    Shoulder external rotation    Middle trapezius    Lower trapezius    Elbow flexion    Elbow extension    Wrist flexion    Wrist extension    Wrist ulnar deviation    Wrist radial deviation    Wrist pronation    Wrist supination    Grip strength (lbs)    (Blank rows = not tested)    PALPATION:  Not tested    TODAY'S TREATMENT:  DATE:   04/24/23:  All single UE exercises performed on right  Shoulder pendulums flexion and extension on left 1 x 30s  Shoulder pendulums adduction and abduction on left 1 x 30s  Bent over shoulder shrugs - 1x10 with 3 second holds Standing scapula retractions - 2x10 with 3 second holds - min cuing for shoulder blades down and back  Shoulder ER/IR at 0 deg abduction - 1 x 10 with 3 second holds Closed chain shoulder flexion PROM - 1x10 with 3 second holds Closed chain shoulder abduction PROM - 1x10 with 3 second holds Pt educated on changes to HEP    04/17/23: All single UE exercises performed on right  Shoulder pendulums flexion and extension on left 3 x 10  Shoulder pendulums adduction and abduction on left 3 x 10  Supine Shoulder Retractions 5 sec hold 1 x 10  Shoulder ER/IR at 0 deg abduction 3 x 10  Shoulder AAROM Flex and Ext on Pulleys x 2  -Pt unable to perform due to pain    04/10/23: Cervical Rotation AROM Clockwise 1 x 10  Cervical Rotation AROM Counter Clockwise 1 x 10  Seated Scapular Retraction with 3 sec hold 1 x 10    PATIENT EDUCATION: Education details: Form and technique for correct performance of exercise and use of modalities such as ice to reduce inflammation  Person educated: Patient Education method: Programmer, multimedia, Demonstration, Verbal cues, and Handouts Education comprehension: verbalized understanding, returned demonstration, and verbal cues required  HOME EXERCISE PROGRAM: Access Code: [email or web address] URL: [email or web address] Date: 04/24/2023 Prepared by: Ellin Goodie  Exercises - Seated Upper Trapezius Stretch  - 3-4 x weekly - 3 reps - 30 sec hold - Standing Horizontal Shoulder Pendulum Supported with Arm Bent  - 1 x daily - 1 sets - 30 reps - Standing Flexion Extension Shoulder Pendulum Supported with Arm Bent  - 1 x daily - 1 sets - 30 reps - Seated Shoulder ER and IR AAROM wiht cane   - 3-4 x weekly - 3 sets - 5 reps - Standing Scapular Retraction  - 1 x daily - 2 sets - 10 reps - Seated Shoulder Flexion Towel Slide at Table Top  - 1 x daily - 3 sets - 10 reps - 3 hold - Seated Shoulder Abduction Towel Slide at Table Top  - 1 x daily - 3 sets - 10 reps   ASSESSMENT:  CLINICAL IMPRESSION: Pt presents for follow up s/p 18 days post op for right rotator cuff repair. She continues to be severely limited by left shoulder pain that is radiating down into three middle fingers. Pt was able to perform new scapular strengthening exercises and closed chain passive range of motion exercises to progress towards goals. Pt will benefit from skilled PT to address these aforementioned deficits to return to reaching overhead and lifting objects to return to driving and performing self care tasks independently.    OBJECTIVE IMPAIRMENTS: decreased ROM, decreased strength, impaired sensation, impaired UE functional use, and pain.    ACTIVITY LIMITATIONS: carrying, lifting, reach over head, and caring for others  PARTICIPATION LIMITATIONS: meal prep, cleaning, laundry, driving, shopping, community activity, and occupation  PERSONAL FACTORS: Fitness, Past/current experiences, Time since onset of injury/illness/exacerbation, and 1-2 comorbidities: HTN and HLD   are also affecting patient's functional outcome.   REHAB POTENTIAL: Fair multiple rotator cuff surgeries   CLINICAL DECISION MAKING: Stable/uncomplicated  EVALUATION COMPLEXITY: Low   GOALS: Goals reviewed with patient? No  SHORT TERM GOALS: Target date:  04/25/2023  PT reviewed the following HEP with patient with patient able to demonstrate a set of the following with min cuing for correction needed. PT educated patient on parameters of therex (how/when to inc/decrease intensity, frequency, rep/set range, stretch hold time, and purpose of therex) with verbalized understanding.  Baseline: NT 04/17/23: Performing independently  Goal status: ONGOING   2.  Patient will demonstrate right shoulder PROM flexion to >=100 degrees and abduction to >=90 degrees as evidence of tissue healing.  Baseline: Shoulder Flex R/L NT/180, Shoulder Abd R/L NT/180  Goal status: ONGOING      LONG TERM GOALS: Target date: 06/20/2023  Patient will have improved function and activity level as evidenced by an increase in FOTO score by 10 points or more.  Baseline: 40  Goal status: ONGOING   2.  Patient will improve right shoulder AROM to be symmetrical with left shoulder AROM for improved RUE function to perform reaching tasks like grabbing objects off a shelf.  Baseline: Shoulder Flex R/L NT/180, Shoulder Abd R/L NT/180  Goal status: ONGOING   3.  Patient will improve right shoulder strength to  Baseline:  Shoulder Flex R/L NT/5, Shoulder Abd R/L NT/5, Shoulder ER R/L NT/NT, Shoulder IR R/L NT/NT  Goal status: ONGOING    PLAN:  PT FREQUENCY: 1-2x/week  PT DURATION: 10  weeks  PLANNED INTERVENTIONS: 97164- PT Re-evaluation, 97110-Therapeutic exercises, 97530- Therapeutic activity, 97112- Neuromuscular re-education, 97535- Self Care, 86578- Manual therapy, 97014- Electrical stimulation (unattended), [id number]- Electrical stimulation (manual), Patient/Family education, Taping, Dry Needling, Joint mobilization, Joint manipulation, Spinal manipulation, Spinal mobilization, Scar mobilization, DME instructions, Cryotherapy, and Moist heat  PLAN FOR NEXT SESSION: Continue PROM of R shoulder and parascapular strengthening. If pain decreases, attempt AAROM exercises.  Arcelia Jew, Student-PT [employer] DPT Class of 2025

## 2023-04-25 ENCOUNTER — Other Ambulatory Visit: Payer: Self-pay | Admitting: Internal Medicine

## 2023-04-25 ENCOUNTER — Other Ambulatory Visit: Payer: Self-pay | Admitting: Orthopaedic Surgery

## 2023-04-25 ENCOUNTER — Encounter (HOSPITAL_BASED_OUTPATIENT_CLINIC_OR_DEPARTMENT_OTHER): Payer: Self-pay | Admitting: Orthopaedic Surgery

## 2023-04-26 ENCOUNTER — Ambulatory Visit: Payer: Commercial Managed Care - PPO | Admitting: Physical Therapy

## 2023-04-26 ENCOUNTER — Other Ambulatory Visit: Payer: Self-pay

## 2023-04-26 ENCOUNTER — Other Ambulatory Visit (HOSPITAL_BASED_OUTPATIENT_CLINIC_OR_DEPARTMENT_OTHER): Payer: Self-pay | Admitting: Orthopaedic Surgery

## 2023-04-26 ENCOUNTER — Other Ambulatory Visit (HOSPITAL_COMMUNITY): Payer: Self-pay

## 2023-04-26 MED ORDER — MELOXICAM 15 MG PO TABS
15.0000 mg | ORAL_TABLET | Freq: Every day | ORAL | 0 refills | Status: DC
  Filled 2023-04-26: qty 14, 14d supply, fill #0

## 2023-04-26 MED ORDER — GABAPENTIN 100 MG PO CAPS
100.0000 mg | ORAL_CAPSULE | Freq: Three times a day (TID) | ORAL | 1 refills | Status: DC
  Filled 2023-04-26: qty 30, 10d supply, fill #0

## 2023-04-26 MED ORDER — EPINEPHRINE 0.3 MG/0.3ML IJ SOAJ
0.3000 mg | INTRAMUSCULAR | 0 refills | Status: AC | PRN
  Filled 2023-04-26: qty 2, 30d supply, fill #0

## 2023-04-26 MED ORDER — ASPIRIN 81 MG PO TBEC
81.0000 mg | DELAYED_RELEASE_TABLET | Freq: Every day | ORAL | 3 refills | Status: DC
  Filled 2023-04-26: qty 90, 90d supply, fill #0
  Filled 2023-09-05 – 2023-09-18 (×2): qty 90, 90d supply, fill #1

## 2023-04-27 ENCOUNTER — Other Ambulatory Visit: Payer: Self-pay

## 2023-05-01 ENCOUNTER — Ambulatory Visit: Payer: Commercial Managed Care - PPO | Admitting: Physical Therapy

## 2023-05-01 DIAGNOSIS — M25511 Pain in right shoulder: Secondary | ICD-10-CM | POA: Diagnosis not present

## 2023-05-01 DIAGNOSIS — M542 Cervicalgia: Secondary | ICD-10-CM | POA: Diagnosis not present

## 2023-05-01 DIAGNOSIS — S46011A Strain of muscle(s) and tendon(s) of the rotator cuff of right shoulder, initial encounter: Secondary | ICD-10-CM | POA: Diagnosis not present

## 2023-05-01 DIAGNOSIS — Z9889 Other specified postprocedural states: Secondary | ICD-10-CM | POA: Diagnosis not present

## 2023-05-01 NOTE — Therapy (Unsigned)
OUTPATIENT PHYSICAL THERAPY SHOULDER TREATMENT   Patient Name: COROLYN HASHBARGER MRN: [id number] DOB:11/17/1970, 52 y.o., female Today's Date: 05/01/2023  END OF SESSION:  PT End of Session - 05/01/23 1741     Visit Number 4    Number of Visits 20    Date for PT Re-Evaluation 06/19/23    Authorization Type Lanetta Inch 2024    Authorization Time Period 25 PT/OT    Authorization - Visit Number 4    Authorization - Number of Visits 20    PT Start Time 1645    PT Stop Time 1730    PT Time Calculation (min) 45 min    Activity Tolerance Patient limited by pain    Behavior During Therapy [facility] for tasks assessed/performed                Past Medical History:  Diagnosis Date   Allergy    Anemia    Anxiety    claustrophobic   Asthma    Back pain    Biceps tendonosis of right shoulder    Cataract    Mixed OU   COVID-19    covid PNA hospitalized 2020, 06/06/21   Diabetes mellitus 2011   did not start metforfin, losing weight   Difficult intubation    tooth got chipped one time   Dyspnea    Essential hypertension    Fatty liver    Food allergy    Frozen shoulder    left, had steroid injection 10/05/21   Headache disorder    History of concussion    HLD (hyperlipidemia)    Hypertensive retinopathy    OU   IBS (irritable bowel syndrome)    Infertility, female    Joint pain    Lactose intolerance    Leg edema    Migraines    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   Palpitation    Post-menopausal    Seborrheic dermatitis    Shoulder impingement syndrome, right    Stage 3a chronic kidney disease (CKD) (HCC)    Type 2 diabetes mellitus with complication, with long-term current use of insulin (HCC) 2018   Uveitis    [place] [facility] est in 2020 seen specialist in [place] [facility] last seen 11 or 05/2020   Vitamin D deficiency    Past Surgical History:  Procedure Laterality Date   ABDOMINAL HYSTERECTOMY  2006   heavy menses, endometriosis, l oophrectomy    BREAST BIOPSY Right 2018   benign   BREAST EXCISIONAL BIOPSY Right 2003   benign   BREAST EXCISIONAL BIOPSY Right 1999   benign   COLONOSCOPY  10/06/2020   INGUINAL HERNIA REPAIR Left 2003   LEFT OOPHORECTOMY  2006   LUMBAR FUSION  2005   L5-S1   SHOULDER ARTHROSCOPY WITH ROTATOR CUFF REPAIR AND SUBACROMIAL DECOMPRESSION  04/06/2023   Procedure: SHOULDER ARTHROSCOPY WITH ROTATOR CUFF REPAIR , EXTENSIVE DEBRIDEMENT, AND SUBACROMIAL DECOMPRESSION;  Surgeon: Huel Cote, MD;  Location: [facility];  Service: Orthopedics;;   SHOULDER ARTHROSCOPY WITH SUBACROMIAL DECOMPRESSION AND OPEN ROTATOR C Right 07/14/2016   Procedure: RIGHT SHOULDER ARTHROSCOPY WITH SUBACROMIAL DECOMPRESSION, DISTAL CLAVICLE RESECTION AND MINI OPEN ROTATOR CUFF REPAIR, OPEN BICEP TENDODESIS;  Surgeon: Valeria Batman, MD;  Location: [facility];  Service: Orthopedics;  Laterality: Right;   SHOULDER CLOSED REDUCTION Right 09/08/2016   Procedure: RIGHT CLOSED MANIPULATION SHOULDER;  Surgeon: Valeria Batman, MD;  Location: [facility];  Service: Orthopedics;  Laterality: Right;  TUBAL LIGATION     UPPER GASTROINTESTINAL ENDOSCOPY  10/06/2020   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 09/21/2021   Chronic left shoulder pain 09/21/2021   Fatty infiltration of liver 07/26/2020   Diarrhea, functional 06/22/2020   Uveitis, intermediate, bilateral 09/07/2019   Generalized edema 06/13/2019   Hospital discharge follow-up 06/07/2019   Multinodular goiter 12/05/2017   Microalbuminuria due to type 2 diabetes mellitus (HCC) 11/26/2017   Encounter for preventive health examination 11/26/2017   Menopause syndrome 06/10/2017   Adverse reaction to vaccine, sequela 03/25/2017   Atypical chest pain 02/27/2017   PSVT (paroxysmal  supraventricular tachycardia) (HCC) 11/26/2016   Nontraumatic incomplete tear of right rotator cuff 07/14/2016   AC (acromioclavicular) arthritis 07/14/2016   Impingement syndrome of right shoulder 07/14/2016   Fibrocystic breast changes, right 05/21/2016   Insomnia 10/16/2015   Snoring 07/26/2015   Chronic migraine w/o aura w/o status migrainosus, not intractable 02/24/2015   History of concussion 07/21/2014   Hyperlipidemia associated with type 2 diabetes mellitus (HCC) 06/13/2014   Vitamin D deficiency 05/07/2013   Chronic migraine 11/20/2012   S/P Total Abdominal Hysterectomy and Left Salpingo-oophorectomy 10/20/2011   Headache around the eyes    Essential hypertension 07/20/2011    PCP: Dr. Duncan Dull   REFERRING PROVIDER: Dr. Huel Cote   REFERRING DIAG: Right Rotator Cuff Repair   THERAPY DIAG:  Acute pain of right shoulder  Rationale for Evaluation and Treatment: Rehabilitation  ONSET DATE: 04/06/23  SUBJECTIVE:                                                                                                                                                                                      SUBJECTIVE STATEMENT:  Pt reports high levels of pain in right shoulder and arm, muscle spasms, and inability to sleep. 6/10 pain. Pain is still sharp and radiates to hands. Spasms occur in response to movement.   PERTINENT HISTORY: Pt reports increased right shoulder pain which has been determined that it has nerve damage. This is the second right shoulder surgery and she did have PT in the past. She had RTC repair on October 31st. 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   PAIN:  Are you having pain? Yes: NPRS scale: 6/10 Pain location: Anterior shoulder and radiates down into middle fingers Pain description: Achy and gnawing  Aggravating factors: Moving Shoulder  Relieving factors: Gabapentin  and polar pack   PRECAUTIONS: Other:  No  Shoulder AROM, No Lifting of Objects, No Supporting of Body Weight by Hands   RED FLAGS: None   WEIGHT BEARING RESTRICTIONS: No  FALLS:  Has patient fallen in last 6 months? No  LIVING ENVIRONMENT: Did not ask  Lives with: Not asked  Lives in: Not asked  Stairs: Not asked  Has following equipment at home: Not asked   OCCUPATION: Medical Billing Instructor   PLOF: Independent  PATIENT GOALS: Return to using right shoulder for functional activities like lifting objects and reaching overhead   NEXT MD VISIT: She is not sure   OBJECTIVE:  Note: Objective measures were completed at Evaluation unless otherwise noted.  VITALS BP 118/80  DIAGNOSTIC FINDINGS:  CLINICAL DATA:  Neck and right shoulder pain and tightness. History of right shoulder arthroscopy and subacromial decompression with distal clavicle excision and mini open rotator cuff repair.   EXAM: RIGHT SHOULDER - 2+ VIEW   COMPARISON:  Right shoulder radiographs 06/12/2017   FINDINGS: Normal bone mineralization. Normal alignment. Postsurgical changes of prior acromioplasty and distal clavicle excision. No acute fracture or dislocation. Lucent screw tracts within the greater tuberosity from prior rotator cuff repair.   IMPRESSION: Postsurgical changes of prior acromioplasty and distal clavicle excision. No acute fracture or dislocation.     Electronically Signed   By: Neita Garnet M.D.   On: 02/01/2023 15:09  PATIENT SURVEYS:  FOTO 40 with target of 60   COGNITION: Overall cognitive status: Within functional limits for tasks assessed     SENSATION: Light touch: Impaired  numbness and tingling down shoulder into right palm   POSTURE: Rounded shoulders   UPPER EXTREMITY ROM:   Active/ Passive ROM Right eval Left eval  Shoulder flexion NT  180  Shoulder extension    Shoulder abduction NT  180   Shoulder adduction    Shoulder internal rotation    Shoulder external rotation    Elbow flexion     Elbow extension    Wrist flexion    Wrist extension    Wrist ulnar deviation    Wrist radial deviation    Wrist pronation    Wrist supination    (Blank rows = not tested)  UPPER EXTREMITY MMT:  MMT Right eval Left eval  Shoulder flexion NT 5  Shoulder extension    Shoulder abduction NT  5  Shoulder adduction    Shoulder internal rotation    Shoulder external rotation    Middle trapezius    Lower trapezius    Elbow flexion    Elbow extension    Wrist flexion    Wrist extension    Wrist ulnar deviation    Wrist radial deviation    Wrist pronation    Wrist supination    Grip strength (lbs)    (Blank rows = not tested)    PALPATION:  Not tested    TODAY'S TREATMENT:  DATE:  05/01/23:  All single UE exercises performed on right  Warmup: pendulums in multiple planes 2x30s  Closed chain pendulums 1x30s  - feel the same as when supported by arm Closed chain shoulder flexion PROM 1x10 with 3 sec holds Closed chain abduction shoulder PROM 1x10 with 3 sec holds Shoulder ER PROM with rod 1x10 with 3 sec holds Shoulder IR PROM with rod 1x10 with 3 sec holds Pt still reporting high levels of pain in shoulder and down arm Hot pack applied to shoulder 5 min Shoulder ER AAROM 1x5 with rod   04/24/23:  All single UE exercises performed on right  Shoulder pendulums flexion and extension on left 1 x 30s  Shoulder pendulums adduction and abduction on left 1 x 30s  Bent over shoulder shrugs - 1x10 with 3 second holds Standing scapula retractions - 2x10 with 3 second holds - min cuing for shoulder blades down and back  Shoulder ER/IR at 0 deg abduction - 1 x 10 with 3 second holds Closed chain shoulder flexion PROM - 1x10 with 3 second holds Closed chain shoulder abduction PROM - 1x10 with 3 second holds Pt educated on changes to HEP     04/17/23: All single UE exercises performed on right  Shoulder pendulums flexion and extension on left 3 x 10  Shoulder pendulums adduction and abduction on left 3 x 10  Supine Shoulder Retractions 5 sec hold 1 x 10  Shoulder ER/IR at 0 deg abduction 3 x 10  Shoulder AAROM Flex and Ext on Pulleys x 2  -Pt unable to perform due to pain   04/10/23: Cervical Rotation AROM Clockwise 1 x 10  Cervical Rotation AROM Counter Clockwise 1 x 10  Seated Scapular Retraction with 3 sec hold 1 x 10    PATIENT EDUCATION: Education details: Form and technique for correct performance of exercise and use of modalities such as ice to reduce inflammation  Person educated: Patient Education method: Programmer, multimedia, Demonstration, Verbal cues, and Handouts Education comprehension: verbalized understanding, returned demonstration, and verbal cues required  HOME EXERCISE PROGRAM: Access Code: [email or web address] URL: [email or web address] Date: 05/01/2023 Prepared by: Ellin Goodie  Exercises - Standing Horizontal Shoulder Pendulum Supported with Arm Bent  - 1 x daily - 1 sets - 30 reps - Standing Flexion Extension Shoulder Pendulum Supported with Arm Bent  - 1 x daily - 1 sets - 30 reps - Seated Shoulder ER and IR AAROM wiht cane   - 3-4 x weekly - 3 sets - 5 reps - Standing Scapular Retraction  - 1 x daily - 2 sets - 10 reps - Seated Shoulder Flexion Towel Slide at Table Top  - 1 x daily - 3 sets - 10 reps - 3 hold - Seated Shoulder Abduction Towel Slide at Table Top  - 1 x daily - 3 sets - 10 reps   ASSESSMENT:  CLINICAL IMPRESSION: Pt presents for follow up s/p 3.5 weeks post op for right rotator cuff repair. She continues to be severely limited by left shoulder pain that is radiating down into three middle fingers. She continued to perform closed chain passive range of motion exercises to progress towards goals. Pt also performed some AAROM to progress with rehab protocol but this is also  severely limited by high pain. Pt will benefit from skilled PT to address these aforementioned deficits to return to reaching overhead and lifting objects to return to driving and performing self care tasks independently.    OBJECTIVE IMPAIRMENTS: decreased ROM, decreased  strength, impaired sensation, impaired UE functional use, and pain.   ACTIVITY LIMITATIONS: carrying, lifting, reach over head, and caring for others  PARTICIPATION LIMITATIONS: meal prep, cleaning, laundry, driving, shopping, community activity, and occupation  PERSONAL FACTORS: Fitness, Past/current experiences, Time since onset of injury/illness/exacerbation, and 1-2 comorbidities: HTN and HLD   are also affecting patient's functional outcome.   REHAB POTENTIAL: Fair multiple rotator cuff surgeries   CLINICAL DECISION MAKING: Stable/uncomplicated  EVALUATION COMPLEXITY: Low   GOALS: Goals reviewed with patient? No  SHORT TERM GOALS: Target date: 04/25/2023  PT reviewed the following HEP with patient with patient able to demonstrate a set of the following with min cuing for correction needed. PT educated patient on parameters of therex (how/when to inc/decrease intensity, frequency, rep/set range, stretch hold time, and purpose of therex) with verbalized understanding.  Baseline: NT 04/17/23: Performing independently  Goal status: ONGOING   2.  Patient will demonstrate right shoulder PROM flexion to >=100 degrees and abduction to >=90 degrees as evidence of tissue healing.  Baseline: Shoulder Flex R/L NT/180, Shoulder Abd R/L NT/180  Goal status: ONGOING      LONG TERM GOALS: Target date: 06/20/2023  Patient will have improved function and activity level as evidenced by an increase in FOTO score by 10 points or more.  Baseline: 40  Goal status: ONGOING   2.  Patient will improve right shoulder AROM to be symmetrical with left shoulder AROM for improved RUE function to perform reaching tasks like grabbing  objects off a shelf.  Baseline: Shoulder Flex R/L NT/180, Shoulder Abd R/L NT/180  Goal status: ONGOING   3.  Patient will improve right shoulder strength to  Baseline:  Shoulder Flex R/L NT/5, Shoulder Abd R/L NT/5, Shoulder ER R/L NT/NT, Shoulder IR R/L NT/NT  Goal status: ONGOING    PLAN:  PT FREQUENCY: 1-2x/week  PT DURATION: 10 weeks  PLANNED INTERVENTIONS: 97164- PT Re-evaluation, 97110-Therapeutic exercises, 97530- Therapeutic activity, 97112- Neuromuscular re-education, 97535- Self Care, 82956- Manual therapy, 97014- Electrical stimulation (unattended), [id number]- Electrical stimulation (manual), Patient/Family education, Taping, Dry Needling, Joint mobilization, Joint manipulation, Spinal manipulation, Spinal mobilization, Scar mobilization, DME instructions, Cryotherapy, and Moist heat  PLAN FOR NEXT SESSION: Continue PROM of R shoulder and parascapular strengthening. If pain decreases, attempt AAROM exercises.  Arcelia Jew, Student-PT [employer] DPT Class of 2025

## 2023-05-03 ENCOUNTER — Ambulatory Visit: Payer: Commercial Managed Care - PPO | Admitting: Physical Therapy

## 2023-05-03 DIAGNOSIS — Z9889 Other specified postprocedural states: Secondary | ICD-10-CM

## 2023-05-03 DIAGNOSIS — S46011A Strain of muscle(s) and tendon(s) of the rotator cuff of right shoulder, initial encounter: Secondary | ICD-10-CM | POA: Diagnosis not present

## 2023-05-03 DIAGNOSIS — M25511 Pain in right shoulder: Secondary | ICD-10-CM

## 2023-05-03 DIAGNOSIS — M542 Cervicalgia: Secondary | ICD-10-CM | POA: Diagnosis not present

## 2023-05-03 NOTE — Therapy (Signed)
OUTPATIENT PHYSICAL THERAPY SHOULDER TREATMENT   Patient Name: [name] MRN: [id number] DOB:15-Dec-1970, 52 y.o., female Today's Date: 05/03/2023  END OF SESSION:  PT End of Session - 05/03/23 1654     Visit Number 5    Number of Visits 20    Date for PT Re-Evaluation 06/19/23    Authorization Type Lanetta Inch 2024    Authorization Time Period 25 PT/OT    Authorization - Visit Number 5    Authorization - Number of Visits 20    Progress Note Due on Visit 10    PT Start Time 1645    PT Stop Time 1730    PT Time Calculation (min) 45 min    Activity Tolerance Patient limited by pain    Behavior During Therapy [facility] for tasks assessed/performed                Past Medical History:  Diagnosis Date   Allergy    Anemia    Anxiety    claustrophobic   Asthma    Back pain    Biceps tendonosis of right shoulder    Cataract    Mixed OU   COVID-19    covid PNA hospitalized 2020, 06/06/21   Diabetes mellitus 2011   did not start metforfin, losing weight   Difficult intubation    tooth got chipped one time   Dyspnea    Essential hypertension    Fatty liver    Food allergy    Frozen shoulder    left, had steroid injection 10/05/21   Headache disorder    History of concussion    HLD (hyperlipidemia)    Hypertensive retinopathy    OU   IBS (irritable bowel syndrome)    Infertility, female    Joint pain    Lactose intolerance    Leg edema    Migraines    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   Palpitation    Post-menopausal    Seborrheic dermatitis    Shoulder impingement syndrome, right    Stage 3a chronic kidney disease (CKD) (HCC)    Type 2 diabetes mellitus with complication, with long-term current use of insulin (HCC) 2018   Uveitis    [place] [facility] est in 2020 seen specialist in [place] [facility] last seen 11 or 05/2020   Vitamin D deficiency    Past Surgical History:  Procedure Laterality Date   ABDOMINAL HYSTERECTOMY  2006   heavy  menses, endometriosis, l oophrectomy   BREAST BIOPSY Right 2018   benign   BREAST EXCISIONAL BIOPSY Right 2003   benign   BREAST EXCISIONAL BIOPSY Right 1999   benign   COLONOSCOPY  10/06/2020   INGUINAL HERNIA REPAIR Left 2003   LEFT OOPHORECTOMY  2006   LUMBAR FUSION  2005   L5-S1   SHOULDER ARTHROSCOPY WITH ROTATOR CUFF REPAIR AND SUBACROMIAL DECOMPRESSION  04/06/2023   Procedure: SHOULDER ARTHROSCOPY WITH ROTATOR CUFF REPAIR , EXTENSIVE DEBRIDEMENT, AND SUBACROMIAL DECOMPRESSION;  Surgeon: Huel Cote, MD;  Location: [facility];  Service: Orthopedics;;   SHOULDER ARTHROSCOPY WITH SUBACROMIAL DECOMPRESSION AND OPEN ROTATOR C Right 07/14/2016   Procedure: RIGHT SHOULDER ARTHROSCOPY WITH SUBACROMIAL DECOMPRESSION, DISTAL CLAVICLE RESECTION AND MINI OPEN ROTATOR CUFF REPAIR, OPEN BICEP TENDODESIS;  Surgeon: Valeria Batman, MD;  Location: [facility];  Service: Orthopedics;  Laterality: Right;   SHOULDER CLOSED REDUCTION Right 09/08/2016   Procedure: RIGHT CLOSED MANIPULATION SHOULDER;  Surgeon: Valeria Batman, MD;  Location: [facility];  Service: Orthopedics;  Laterality: Right;   TUBAL LIGATION     UPPER GASTROINTESTINAL ENDOSCOPY  10/06/2020   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 09/21/2021   Chronic left shoulder pain 09/21/2021   Fatty infiltration of liver 07/26/2020   Diarrhea, functional 06/22/2020   Uveitis, intermediate, bilateral 09/07/2019   Generalized edema 06/13/2019   Hospital discharge follow-up 06/07/2019   Multinodular goiter 12/05/2017   Microalbuminuria due to type 2 diabetes mellitus (HCC) 11/26/2017   Encounter for preventive health examination 11/26/2017   Menopause syndrome 06/10/2017   Adverse reaction to vaccine, sequela 03/25/2017   Atypical chest pain  02/27/2017   PSVT (paroxysmal supraventricular tachycardia) (HCC) 11/26/2016   Nontraumatic incomplete tear of right rotator cuff 07/14/2016   AC (acromioclavicular) arthritis 07/14/2016   Impingement syndrome of right shoulder 07/14/2016   Fibrocystic breast changes, right 05/21/2016   Insomnia 10/16/2015   Snoring 07/26/2015   Chronic migraine w/o aura w/o status migrainosus, not intractable 02/24/2015   History of concussion 07/21/2014   Hyperlipidemia associated with type 2 diabetes mellitus (HCC) 06/13/2014   Vitamin D deficiency 05/07/2013   Chronic migraine 11/20/2012   S/P Total Abdominal Hysterectomy and Left Salpingo-oophorectomy 10/20/2011   Headache around the eyes    Essential hypertension 07/20/2011    PCP: Dr. Duncan Dull   REFERRING PROVIDER: Dr. Huel Cote   REFERRING DIAG: Right Rotator Cuff Repair   THERAPY DIAG:  Acute pain of right shoulder  S/P right rotator cuff repair  Rationale for Evaluation and Treatment: Rehabilitation  ONSET DATE: 04/06/23  SUBJECTIVE:                                                                                                                                                                                      SUBJECTIVE STATEMENT:  Pt states that she continues to experience radiating pain down into her fingers. She describes pain feeling improved.   PERTINENT HISTORY: Pt reports increased right shoulder pain which has been determined that it has nerve damage. This is the second right shoulder surgery and she did have PT in the past. She had RTC repair on October 31st. 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   PAIN:  Are you having pain? Yes: NPRS scale: 5/10 Pain location: Anterior shoulder and radiates down into middle fingers Pain description: Achy and gnawing  Aggravating factors: Moving Shoulder  Relieving factors: Gabapentin  and polar pack  PRECAUTIONS: Other: No  Shoulder AROM, No Lifting of Objects, No Supporting of Body Weight by Hands   RED FLAGS: None   WEIGHT BEARING RESTRICTIONS: No  FALLS:  Has patient fallen in last 6 months? No  LIVING ENVIRONMENT: Did not ask  Lives with: Not asked  Lives in: Not asked  Stairs: Not asked  Has following equipment at home: Not asked   OCCUPATION: Medical Billing Instructor   PLOF: Independent  PATIENT GOALS: Return to using right shoulder for functional activities like lifting objects and reaching overhead   NEXT MD VISIT: She is not sure   OBJECTIVE:  Note: Objective measures were completed at Evaluation unless otherwise noted.  VITALS BP 118/80  DIAGNOSTIC FINDINGS:  CLINICAL DATA:  Neck and right shoulder pain and tightness. History of right shoulder arthroscopy and subacromial decompression with distal clavicle excision and mini open rotator cuff repair.   EXAM: RIGHT SHOULDER - 2+ VIEW   COMPARISON:  Right shoulder radiographs 06/12/2017   FINDINGS: Normal bone mineralization. Normal alignment. Postsurgical changes of prior acromioplasty and distal clavicle excision. No acute fracture or dislocation. Lucent screw tracts within the greater tuberosity from prior rotator cuff repair.   IMPRESSION: Postsurgical changes of prior acromioplasty and distal clavicle excision. No acute fracture or dislocation.     Electronically Signed   By: Neita Garnet M.D.   On: 02/01/2023 15:09  PATIENT SURVEYS:  FOTO 40 with target of 60   COGNITION: Overall cognitive status: Within functional limits for tasks assessed     SENSATION: Light touch: Impaired  numbness and tingling down shoulder into right palm   POSTURE: Rounded shoulders   UPPER EXTREMITY ROM:   Active/ Passive ROM Right eval Left eval  Shoulder flexion NT  180  Shoulder extension    Shoulder abduction NT  180   Shoulder adduction    Shoulder internal rotation    Shoulder external rotation    Elbow flexion     Elbow extension    Wrist flexion    Wrist extension    Wrist ulnar deviation    Wrist radial deviation    Wrist pronation    Wrist supination    (Blank rows = not tested)  UPPER EXTREMITY MMT:  MMT Right eval Left eval  Shoulder flexion NT 5  Shoulder extension    Shoulder abduction NT  5  Shoulder adduction    Shoulder internal rotation    Shoulder external rotation    Middle trapezius    Lower trapezius    Elbow flexion    Elbow extension    Wrist flexion    Wrist extension    Wrist ulnar deviation    Wrist radial deviation    Wrist pronation    Wrist supination    Grip strength (lbs)    (Blank rows = not tested)    PALPATION:  Not tested    TODAY'S TREATMENT:  DATE:   05/03/23: Ball Squeezes 1 x 10    05/01/23:  All single UE exercises performed on right  Warmup: pendulums in multiple planes 2x30s  Closed chain pendulums 1x30s  - feel the same as when supported by arm Closed chain shoulder flexion PROM 1x10 with 3 sec holds Closed chain abduction shoulder PROM 1x10 with 3 sec holds Shoulder ER PROM with rod 1x10 with 3 sec holds Shoulder IR PROM with rod 1x10 with 3 sec holds Pt still reporting high levels of pain in shoulder and down arm Hot pack applied to shoulder 5 min Shoulder ER AAROM 1x5 with rod   04/24/23:  All single UE exercises performed on right  Shoulder pendulums flexion and extension on left 1 x 30s  Shoulder pendulums adduction and abduction on left 1 x 30s  Bent over shoulder shrugs - 1x10 with 3 second holds Standing scapula retractions - 2x10 with 3 second holds - min cuing for shoulder blades down and back  Shoulder ER/IR at 0 deg abduction - 1 x 10 with 3 second holds Closed chain shoulder flexion PROM - 1x10 with 3 second holds Closed chain shoulder abduction PROM - 1x10 with 3 second  holds Pt educated on changes to HEP    04/17/23: All single UE exercises performed on right  Shoulder pendulums flexion and extension on left 3 x 10  Shoulder pendulums adduction and abduction on left 3 x 10  Supine Shoulder Retractions 5 sec hold 1 x 10  Shoulder ER/IR at 0 deg abduction 3 x 10  Shoulder AAROM Flex and Ext on Pulleys x 2  -Pt unable to perform due to pain   04/10/23: Cervical Rotation AROM Clockwise 1 x 10  Cervical Rotation AROM Counter Clockwise 1 x 10  Seated Scapular Retraction with 3 sec hold 1 x 10    PATIENT EDUCATION: Education details: Form and technique for correct performance of exercise and use of modalities such as ice to reduce inflammation  Person educated: Patient Education method: Programmer, multimedia, Demonstration, Verbal cues, and Handouts Education comprehension: verbalized understanding, returned demonstration, and verbal cues required  HOME EXERCISE PROGRAM: Access Code: [email or web address] URL: [email or web address] Date: 05/03/2023 Prepared by: Ellin Goodie  Exercises - Towel Roll Grip with Forearm in Neutral  - 1 x daily - 10 reps - 3 sec  hold - Supine Elbow Flexion Extension AROM  - 1 x daily - 3 sets - 10 reps - Standing Horizontal Shoulder Pendulum Supported with Arm Bent  - 1 x daily - 1 sets - 30 reps - Standing Flexion Extension Shoulder Pendulum Supported with Arm Bent  - 1 x daily - 1 sets - 30 reps - Standing Scapular Retraction  - 1 x daily - 2 sets - 10 reps - Seated Forearm Pronation Supination AROM  - 1 x daily - 3 sets - 10 reps   ASSESSMENT:  CLINICAL IMPRESSION: Pt continues to be limited by right shoulder pain with session limited to elbow and shoulder PROM. Differed AAROM exercises today to avoid pain exacerbation for Thanksgiving holiday. Pt will benefit from skilled PT to address these aforementioned deficits to return to reaching overhead and lifting objects to return to driving and performing self care tasks  independently.    OBJECTIVE IMPAIRMENTS: decreased ROM, decreased strength, impaired sensation, impaired UE functional use, and pain.   ACTIVITY LIMITATIONS: carrying, lifting, reach over head, and caring for others  PARTICIPATION LIMITATIONS: meal prep, cleaning, laundry, driving, shopping, community activity, and occupation  PERSONAL FACTORS:  Fitness, Past/current experiences, Time since onset of injury/illness/exacerbation, and 1-2 comorbidities: HTN and HLD   are also affecting patient's functional outcome.   REHAB POTENTIAL: Fair multiple rotator cuff surgeries   CLINICAL DECISION MAKING: Stable/uncomplicated  EVALUATION COMPLEXITY: Low   GOALS: Goals reviewed with patient? No  SHORT TERM GOALS: Target date: 04/25/2023  PT reviewed the following HEP with patient with patient able to demonstrate a set of the following with min cuing for correction needed. PT educated patient on parameters of therex (how/when to inc/decrease intensity, frequency, rep/set range, stretch hold time, and purpose of therex) with verbalized understanding.  Baseline: NT 04/17/23: Performing independently  Goal status: ONGOING   2.  Patient will demonstrate right shoulder PROM flexion to >=100 degrees and abduction to >=90 degrees as evidence of tissue healing.  Baseline: Shoulder Flex R/L NT/180, Shoulder Abd R/L NT/180  Goal status: ONGOING      LONG TERM GOALS: Target date: 06/20/2023  Patient will have improved function and activity level as evidenced by an increase in FOTO score by 10 points or more.  Baseline: 40  Goal status: ONGOING   2.  Patient will improve right shoulder AROM to be symmetrical with left shoulder AROM for improved RUE function to perform reaching tasks like grabbing objects off a shelf.  Baseline: Shoulder Flex R/L NT/180, Shoulder Abd R/L NT/180  Goal status: ONGOING   3.  Patient will improve right shoulder strength to  Baseline:  Shoulder Flex R/L NT/5, Shoulder  Abd R/L NT/5, Shoulder ER R/L NT/NT, Shoulder IR R/L NT/NT  Goal status: ONGOING    PLAN:  PT FREQUENCY: 1-2x/week  PT DURATION: 10 weeks  PLANNED INTERVENTIONS: 97164- PT Re-evaluation, 97110-Therapeutic exercises, 97530- Therapeutic activity, 97112- Neuromuscular re-education, 97535- Self Care, 16109- Manual therapy, 97014- Electrical stimulation (unattended), [id number]- Electrical stimulation (manual), Patient/Family education, Taping, Dry Needling, Joint mobilization, Joint manipulation, Spinal manipulation, Spinal mobilization, Scar mobilization, DME instructions, Cryotherapy, and Moist heat  PLAN FOR NEXT SESSION: Shoulder Flexion/Extension and Abduction/Adduction Pulleys. Shoulder IR/ER AAROM with wand   Arcelia Jew, Student-PT [employer] DPT Class of 2025

## 2023-05-08 ENCOUNTER — Ambulatory Visit: Payer: Commercial Managed Care - PPO | Attending: Orthopaedic Surgery | Admitting: Physical Therapy

## 2023-05-08 DIAGNOSIS — M542 Cervicalgia: Secondary | ICD-10-CM | POA: Insufficient documentation

## 2023-05-08 DIAGNOSIS — Z9889 Other specified postprocedural states: Secondary | ICD-10-CM | POA: Diagnosis not present

## 2023-05-08 DIAGNOSIS — M25511 Pain in right shoulder: Secondary | ICD-10-CM | POA: Insufficient documentation

## 2023-05-08 NOTE — Therapy (Unsigned)
OUTPATIENT PHYSICAL THERAPY SHOULDER TREATMENT   Patient Name: [name] MRN: [id number] DOB:September 01, 1970, 52 y.o., female Today's Date: 05/09/2023  END OF SESSION:  PT End of Session - 05/08/23 1702     Visit Number 6    Number of Visits 20    Date for PT Re-Evaluation 06/19/23    Authorization Type Lanetta Inch 2024    Authorization Time Period 25 PT/OT    Authorization - Visit Number 6    Authorization - Number of Visits 20    Progress Note Due on Visit 10    PT Start Time 1600    PT Stop Time 1642    PT Time Calculation (min) 42 min    Activity Tolerance Patient tolerated treatment well    Behavior During Therapy WFL for tasks assessed/performed                 Past Medical History:  Diagnosis Date   Allergy    Anemia    Anxiety    claustrophobic   Asthma    Back pain    Biceps tendonosis of right shoulder    Cataract    Mixed OU   COVID-19    covid PNA hospitalized 2020, 06/06/21   Diabetes mellitus 2011   did not start metforfin, losing weight   Difficult intubation    tooth got chipped one time   Dyspnea    Essential hypertension    Fatty liver    Food allergy    Frozen shoulder    left, had steroid injection 10/05/21   Headache disorder    History of concussion    HLD (hyperlipidemia)    Hypertensive retinopathy    OU   IBS (irritable bowel syndrome)    Infertility, female    Joint pain    Lactose intolerance    Leg edema    Migraines    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   Palpitation    Post-menopausal    Seborrheic dermatitis    Shoulder impingement syndrome, right    Stage 3a chronic kidney disease (CKD) (HCC)    Type 2 diabetes mellitus with complication, with long-term current use of insulin (HCC) 2018   Uveitis    [place] [facility] est in 2020 seen specialist in [place] [facility] last seen 11 or 05/2020   Vitamin D deficiency    Past Surgical History:  Procedure Laterality Date   ABDOMINAL HYSTERECTOMY  2006    heavy menses, endometriosis, l oophrectomy   BREAST BIOPSY Right 2018   benign   BREAST EXCISIONAL BIOPSY Right 2003   benign   BREAST EXCISIONAL BIOPSY Right 1999   benign   COLONOSCOPY  10/06/2020   INGUINAL HERNIA REPAIR Left 2003   LEFT OOPHORECTOMY  2006   LUMBAR FUSION  2005   L5-S1   SHOULDER ARTHROSCOPY WITH ROTATOR CUFF REPAIR AND SUBACROMIAL DECOMPRESSION  04/06/2023   Procedure: SHOULDER ARTHROSCOPY WITH ROTATOR CUFF REPAIR , EXTENSIVE DEBRIDEMENT, AND SUBACROMIAL DECOMPRESSION;  Surgeon: Huel Cote, MD;  Location: [facility];  Service: Orthopedics;;   SHOULDER ARTHROSCOPY WITH SUBACROMIAL DECOMPRESSION AND OPEN ROTATOR C Right 07/14/2016   Procedure: RIGHT SHOULDER ARTHROSCOPY WITH SUBACROMIAL DECOMPRESSION, DISTAL CLAVICLE RESECTION AND MINI OPEN ROTATOR CUFF REPAIR, OPEN BICEP TENDODESIS;  Surgeon: Valeria Batman, MD;  Location: [facility];  Service: Orthopedics;  Laterality: Right;   SHOULDER CLOSED REDUCTION Right 09/08/2016   Procedure: RIGHT CLOSED MANIPULATION SHOULDER;  Surgeon: Valeria Batman, MD;  Location:  [facility];  Service: Orthopedics;  Laterality: Right;   TUBAL LIGATION     UPPER GASTROINTESTINAL ENDOSCOPY  10/06/2020   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 09/21/2021   Chronic left shoulder pain 09/21/2021   Fatty infiltration of liver 07/26/2020   Diarrhea, functional 06/22/2020   Uveitis, intermediate, bilateral 09/07/2019   Generalized edema 06/13/2019   Hospital discharge follow-up 06/07/2019   Multinodular goiter 12/05/2017   Microalbuminuria due to type 2 diabetes mellitus (HCC) 11/26/2017   Encounter for preventive health examination 11/26/2017   Menopause syndrome 06/10/2017   Adverse reaction to vaccine, sequela 03/25/2017   Atypical chest  pain 02/27/2017   PSVT (paroxysmal supraventricular tachycardia) (HCC) 11/26/2016   Nontraumatic incomplete tear of right rotator cuff 07/14/2016   AC (acromioclavicular) arthritis 07/14/2016   Impingement syndrome of right shoulder 07/14/2016   Fibrocystic breast changes, right 05/21/2016   Insomnia 10/16/2015   Snoring 07/26/2015   Chronic migraine w/o aura w/o status migrainosus, not intractable 02/24/2015   History of concussion 07/21/2014   Hyperlipidemia associated with type 2 diabetes mellitus (HCC) 06/13/2014   Vitamin D deficiency 05/07/2013   Chronic migraine 11/20/2012   S/P Total Abdominal Hysterectomy and Left Salpingo-oophorectomy 10/20/2011   Headache around the eyes    Essential hypertension 07/20/2011    PCP: Dr. Duncan Dull   REFERRING PROVIDER: Dr. Huel Cote   REFERRING DIAG: Right Rotator Cuff Repair   THERAPY DIAG:  Acute pain of right shoulder  S/P right rotator cuff repair  Rationale for Evaluation and Treatment: Rehabilitation  ONSET DATE: 04/06/23  SUBJECTIVE:                                                                                                                                                                                      SUBJECTIVE STATEMENT:  Pt states that feels pretty much the same and lots of spasms. Feels that she can do more with right arm when showering though.   PERTINENT HISTORY: Pt reports increased right shoulder pain which has been determined that it has nerve damage. This is the second right shoulder surgery and she did have PT in the past. She had RTC repair on October 31st. 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   PAIN:  Are you having pain? Yes: NPRS scale: 5/10 Pain location: Anterior shoulder and radiates down into middle fingers Pain description: Achy and gnawing  Aggravating factors: Moving Shoulder  Relieving factors:  Gabapentin  and polar pack   PRECAUTIONS:  Other: No Shoulder AROM, No Lifting of Objects, No Supporting of Body Weight by Hands   RED FLAGS: None   WEIGHT BEARING RESTRICTIONS: No  FALLS:  Has patient fallen in last 6 months? No  LIVING ENVIRONMENT: Did not ask  Lives with: Not asked  Lives in: Not asked  Stairs: Not asked  Has following equipment at home: Not asked   OCCUPATION: Medical Billing Instructor   PLOF: Independent  PATIENT GOALS: Return to using right shoulder for functional activities like lifting objects and reaching overhead   NEXT MD VISIT: She is not sure   OBJECTIVE:  Note: Objective measures were completed at Evaluation unless otherwise noted.  VITALS BP 118/80  DIAGNOSTIC FINDINGS:  CLINICAL DATA:  Neck and right shoulder pain and tightness. History of right shoulder arthroscopy and subacromial decompression with distal clavicle excision and mini open rotator cuff repair.   EXAM: RIGHT SHOULDER - 2+ VIEW   COMPARISON:  Right shoulder radiographs 06/12/2017   FINDINGS: Normal bone mineralization. Normal alignment. Postsurgical changes of prior acromioplasty and distal clavicle excision. No acute fracture or dislocation. Lucent screw tracts within the greater tuberosity from prior rotator cuff repair.   IMPRESSION: Postsurgical changes of prior acromioplasty and distal clavicle excision. No acute fracture or dislocation.     Electronically Signed   By: Neita Garnet M.D.   On: 02/01/2023 15:09  PATIENT SURVEYS:  FOTO 40 with target of 60   COGNITION: Overall cognitive status: Within functional limits for tasks assessed     SENSATION: Light touch: Impaired  numbness and tingling down shoulder into right palm   POSTURE: Rounded shoulders   UPPER EXTREMITY ROM:   Active/ Passive ROM Right eval Left eval  Shoulder flexion NT  180  Shoulder extension    Shoulder abduction NT  180   Shoulder adduction    Shoulder internal rotation    Shoulder external rotation    Elbow  flexion    Elbow extension    Wrist flexion    Wrist extension    Wrist ulnar deviation    Wrist radial deviation    Wrist pronation    Wrist supination    (Blank rows = not tested)  UPPER EXTREMITY MMT:  MMT Right eval Left eval  Shoulder flexion NT 5  Shoulder extension    Shoulder abduction NT  5  Shoulder adduction    Shoulder internal rotation    Shoulder external rotation    Middle trapezius    Lower trapezius    Elbow flexion    Elbow extension    Wrist flexion    Wrist extension    Wrist ulnar deviation    Wrist radial deviation    Wrist pronation    Wrist supination    Grip strength (lbs)    (Blank rows = not tested)    PALPATION:  Not tested    TODAY'S TREATMENT:  DATE:  05/08/23: Warmup: pendulums in multiple planes 2x30s, Ball Squeezes 1 x 10  Pt educated on weaning off sling as per rehab protocol Shoulder flexion AAROM towel slides 1x10 Shoulder abduction AAROM towel slides 1x10 Shoulder ER/IR AROM in pain free range 1x10  - wrist supported on table  - feels spasms but no increase in pain Pt educated on cause of pain and its relationship to muscle activation.   05/03/23: Ball Squeezes 1 x 10    05/01/23:  All single UE exercises performed on right  Warmup: pendulums in multiple planes 2x30s  Closed chain pendulums 1x30s  - feel the same as when supported by arm Closed chain shoulder flexion PROM 1x10 with 3 sec holds Closed chain abduction shoulder PROM 1x10 with 3 sec holds Shoulder ER PROM with rod 1x10 with 3 sec holds Shoulder IR PROM with rod 1x10 with 3 sec holds Pt still reporting high levels of pain in shoulder and down arm Hot pack applied to shoulder 5 min Shoulder ER AAROM 1x5 with rod   04/24/23:  All single UE exercises performed on right  Shoulder pendulums flexion and extension on left  1 x 30s  Shoulder pendulums adduction and abduction on left 1 x 30s  Bent over shoulder shrugs - 1x10 with 3 second holds Standing scapula retractions - 2x10 with 3 second holds - min cuing for shoulder blades down and back  Shoulder ER/IR at 0 deg abduction - 1 x 10 with 3 second holds Closed chain shoulder flexion PROM - 1x10 with 3 second holds Closed chain shoulder abduction PROM - 1x10 with 3 second holds Pt educated on changes to HEP    04/17/23: All single UE exercises performed on right  Shoulder pendulums flexion and extension on left 3 x 10  Shoulder pendulums adduction and abduction on left 3 x 10  Supine Shoulder Retractions 5 sec hold 1 x 10  Shoulder ER/IR at 0 deg abduction 3 x 10  Shoulder AAROM Flex and Ext on Pulleys x 2  -Pt unable to perform due to pain   04/10/23: Cervical Rotation AROM Clockwise 1 x 10  Cervical Rotation AROM Counter Clockwise 1 x 10  Seated Scapular Retraction with 3 sec hold 1 x 10    PATIENT EDUCATION: Education details: Form and technique for correct performance of exercise and use of modalities such as ice to reduce inflammation  Person educated: Patient Education method: Programmer, multimedia, Demonstration, Verbal cues, and Handouts Education comprehension: verbalized understanding, returned demonstration, and verbal cues required  HOME EXERCISE PROGRAM: Access Code: [email or web address] URL: [email or web address] Date: 05/03/2023 Prepared by: Ellin Goodie  Exercises - Towel Roll Grip with Forearm in Neutral  - 1 x daily - 10 reps - 3 sec  hold - Supine Elbow Flexion Extension AROM  - 1 x daily - 3 sets - 10 reps - Standing Horizontal Shoulder Pendulum Supported with Arm Bent  - 1 x daily - 1 sets - 30 reps - Standing Flexion Extension Shoulder Pendulum Supported with Arm Bent  - 1 x daily - 1 sets - 30 reps - Standing Scapular Retraction  - 1 x daily - 2 sets - 10 reps - Seated Forearm Pronation Supination AROM  - 1 x daily - 3 sets -  10 reps   ASSESSMENT:  CLINICAL IMPRESSION: Pt continues to be limited by right shoulder pain with session limited to shoulder AAROM in very small range. PT today focused on pt education and AAROM to activate muscles of right  shoulder. Pt was educated on pain neuroscience related to shoulder motion. Pt was able to better utilize right shoulder musculature on AAROM following this education. Her performance on AAROM exercises shows progress towards goals. Pt will benefit from skilled PT to address these aforementioned deficits to return to reaching overhead and lifting objects to return to driving and performing self care tasks independently.    OBJECTIVE IMPAIRMENTS: decreased ROM, decreased strength, impaired sensation, impaired UE functional use, and pain.   ACTIVITY LIMITATIONS: carrying, lifting, reach over head, and caring for others  PARTICIPATION LIMITATIONS: meal prep, cleaning, laundry, driving, shopping, community activity, and occupation  PERSONAL FACTORS: Fitness, Past/current experiences, Time since onset of injury/illness/exacerbation, and 1-2 comorbidities: HTN and HLD   are also affecting patient's functional outcome.   REHAB POTENTIAL: Fair multiple rotator cuff surgeries   CLINICAL DECISION MAKING: Stable/uncomplicated  EVALUATION COMPLEXITY: Low   GOALS: Goals reviewed with patient? No  SHORT TERM GOALS: Target date: 04/25/2023  PT reviewed the following HEP with patient with patient able to demonstrate a set of the following with min cuing for correction needed. PT educated patient on parameters of therex (how/when to inc/decrease intensity, frequency, rep/set range, stretch hold time, and purpose of therex) with verbalized understanding.  Baseline: NT 04/17/23: Performing independently  Goal status: ONGOING   2.  Patient will demonstrate right shoulder PROM flexion to >=100 degrees and abduction to >=90 degrees as evidence of tissue healing.  Baseline: Shoulder  Flex R/L NT/180, Shoulder Abd R/L NT/180  Goal status: ONGOING      LONG TERM GOALS: Target date: 06/20/2023  Patient will have improved function and activity level as evidenced by an increase in FOTO score by 10 points or more.  Baseline: 40  Goal status: ONGOING   2.  Patient will improve right shoulder AROM to be symmetrical with left shoulder AROM for improved RUE function to perform reaching tasks like grabbing objects off a shelf.  Baseline: Shoulder Flex R/L NT/180, Shoulder Abd R/L NT/180  Goal status: ONGOING   3.  Patient will improve right shoulder strength to  Baseline:  Shoulder Flex R/L NT/5, Shoulder Abd R/L NT/5, Shoulder ER R/L NT/NT, Shoulder IR R/L NT/NT  Goal status: ONGOING    PLAN:  PT FREQUENCY: 1-2x/week  PT DURATION: 10 weeks  PLANNED INTERVENTIONS: 97164- PT Re-evaluation, 97110-Therapeutic exercises, 97530- Therapeutic activity, 97112- Neuromuscular re-education, 97535- Self Care, 82956- Manual therapy, 97014- Electrical stimulation (unattended), [id number]- Electrical stimulation (manual), Patient/Family education, Taping, Dry Needling, Joint mobilization, Joint manipulation, Spinal manipulation, Spinal mobilization, Scar mobilization, DME instructions, Cryotherapy, and Moist heat  PLAN FOR NEXT SESSION: Continue closed chain AAROM exercises with increased volume   Arcelia Jew, Student-PT [employer] DPT Class of 2025

## 2023-05-10 ENCOUNTER — Other Ambulatory Visit: Payer: Self-pay | Admitting: Internal Medicine

## 2023-05-10 ENCOUNTER — Ambulatory Visit: Payer: Commercial Managed Care - PPO | Admitting: Physical Therapy

## 2023-05-10 DIAGNOSIS — Z1231 Encounter for screening mammogram for malignant neoplasm of breast: Secondary | ICD-10-CM

## 2023-05-10 DIAGNOSIS — Z9889 Other specified postprocedural states: Secondary | ICD-10-CM | POA: Diagnosis not present

## 2023-05-10 DIAGNOSIS — M25511 Pain in right shoulder: Secondary | ICD-10-CM | POA: Diagnosis not present

## 2023-05-10 DIAGNOSIS — M542 Cervicalgia: Secondary | ICD-10-CM | POA: Diagnosis not present

## 2023-05-10 LAB — HM DIABETES EYE EXAM

## 2023-05-10 NOTE — Therapy (Unsigned)
OUTPATIENT PHYSICAL THERAPY SHOULDER TREATMENT   Patient Name: [name] MRN: [id number] DOB:1970/12/10, 52 y.o., female Today's Date: 05/10/2023  END OF SESSION:        Past Medical History:  Diagnosis Date   Allergy    Anemia    Anxiety    claustrophobic   Asthma    Back pain    Biceps tendonosis of right shoulder    Cataract    Mixed OU   COVID-19    covid PNA hospitalized 2020, 06/06/21   Diabetes mellitus 2011   did not start metforfin, losing weight   Difficult intubation    tooth got chipped one time   Dyspnea    Essential hypertension    Fatty liver    Food allergy    Frozen shoulder    left, had steroid injection 10/05/21   Headache disorder    History of concussion    HLD (hyperlipidemia)    Hypertensive retinopathy    OU   IBS (irritable bowel syndrome)    Infertility, female    Joint pain    Lactose intolerance    Leg edema    Migraines    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   Palpitation    Post-menopausal    Seborrheic dermatitis    Shoulder impingement syndrome, right    Stage 3a chronic kidney disease (CKD) (HCC)    Type 2 diabetes mellitus with complication, with long-term current use of insulin (HCC) 2018   Uveitis    [facility] est in 2020 seen specialist in [place] [facility] last seen 11 or 05/2020   Vitamin D deficiency    Past Surgical History:  Procedure Laterality Date   ABDOMINAL HYSTERECTOMY  2006   heavy menses, endometriosis, l oophrectomy   BREAST BIOPSY Right 2018   benign   BREAST EXCISIONAL BIOPSY Right 2003   benign   BREAST EXCISIONAL BIOPSY Right 1999   benign   COLONOSCOPY  10/06/2020   INGUINAL HERNIA REPAIR Left 2003   LEFT OOPHORECTOMY  2006   LUMBAR FUSION  2005   L5-S1   SHOULDER ARTHROSCOPY WITH ROTATOR CUFF REPAIR AND SUBACROMIAL DECOMPRESSION  04/06/2023   Procedure: SHOULDER ARTHROSCOPY WITH ROTATOR CUFF REPAIR , EXTENSIVE DEBRIDEMENT, AND SUBACROMIAL DECOMPRESSION;  Surgeon:  Huel Cote, MD;  Location: [facility];  Service: Orthopedics;;   SHOULDER ARTHROSCOPY WITH SUBACROMIAL DECOMPRESSION AND OPEN ROTATOR C Right 07/14/2016   Procedure: RIGHT SHOULDER ARTHROSCOPY WITH SUBACROMIAL DECOMPRESSION, DISTAL CLAVICLE RESECTION AND MINI OPEN ROTATOR CUFF REPAIR, OPEN BICEP TENDODESIS;  Surgeon: Valeria Batman, MD;  Location: [facility];  Service: Orthopedics;  Laterality: Right;   SHOULDER CLOSED REDUCTION Right 09/08/2016   Procedure: RIGHT CLOSED MANIPULATION SHOULDER;  Surgeon: Valeria Batman, MD;  Location: [facility];  Service: Orthopedics;  Laterality: Right;   TUBAL LIGATION     UPPER GASTROINTESTINAL ENDOSCOPY  10/06/2020   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 09/21/2021   Chronic left shoulder pain 09/21/2021   Fatty infiltration of liver 07/26/2020   Diarrhea, functional 06/22/2020   Uveitis, intermediate, bilateral 09/07/2019   Generalized edema 06/13/2019   Hospital discharge follow-up 06/07/2019   Multinodular goiter 12/05/2017   Microalbuminuria due to type 2 diabetes mellitus (HCC) 11/26/2017   Encounter for preventive health examination 11/26/2017  Menopause syndrome 06/10/2017   Adverse reaction to vaccine, sequela 03/25/2017   Atypical chest pain 02/27/2017   PSVT (paroxysmal supraventricular tachycardia) (HCC) 11/26/2016   Nontraumatic incomplete tear of right rotator cuff 07/14/2016   AC (acromioclavicular) arthritis 07/14/2016   Impingement syndrome of right shoulder 07/14/2016   Fibrocystic breast changes, right 05/21/2016   Insomnia 10/16/2015   Snoring 07/26/2015   Chronic migraine w/o aura w/o status migrainosus, not intractable 02/24/2015   History of concussion 07/21/2014   Hyperlipidemia associated with type 2 diabetes mellitus (HCC)  06/13/2014   Vitamin D deficiency 05/07/2013   Chronic migraine 11/20/2012   S/P Total Abdominal Hysterectomy and Left Salpingo-oophorectomy 10/20/2011   Headache around the eyes    Essential hypertension 07/20/2011    PCP: Dr. Duncan Dull   REFERRING PROVIDER: Dr. Huel Cote   REFERRING DIAG: Right Rotator Cuff Repair   THERAPY DIAG:  No diagnosis found.  Rationale for Evaluation and Treatment: Rehabilitation  ONSET DATE: 04/06/23  SUBJECTIVE:                                                                                                                                                                                      SUBJECTIVE STATEMENT:  Pt reports nerve pain in right shoulder may be slightly better, but spasms still present. 5/10 pain on NPRS  PERTINENT HISTORY: Pt reports increased right shoulder pain which has been determined that it has nerve damage. This is the second right shoulder surgery and she did have PT in the past. She had RTC repair on October 31st. 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   PAIN:  Are you having pain? Yes: NPRS scale: 5/10 Pain location: Anterior shoulder and radiates down into middle fingers Pain description: Achy and gnawing  Aggravating factors: Moving Shoulder  Relieving factors: Gabapentin  and polar pack   PRECAUTIONS: Other: No Shoulder AROM, No Lifting of Objects, No Supporting of Body Weight by Hands   RED FLAGS: None   WEIGHT BEARING RESTRICTIONS: No  FALLS:  Has patient fallen in last 6 months? No  LIVING ENVIRONMENT: Did not ask  Lives with: Not asked  Lives in: Not asked  Stairs: Not asked  Has following equipment at home: Not asked   OCCUPATION: Medical Billing Instructor   PLOF: Independent  PATIENT GOALS: Return to using right shoulder for functional activities like lifting objects and reaching overhead   NEXT MD VISIT: She is not sure   OBJECTIVE:  Note:  Objective measures were completed at Evaluation unless otherwise noted.  VITALS BP 118/80  DIAGNOSTIC FINDINGS:  CLINICAL DATA:  Neck and  right shoulder pain and tightness. History of right shoulder arthroscopy and subacromial decompression with distal clavicle excision and mini open rotator cuff repair.   EXAM: RIGHT SHOULDER - 2+ VIEW   COMPARISON:  Right shoulder radiographs 06/12/2017   FINDINGS: Normal bone mineralization. Normal alignment. Postsurgical changes of prior acromioplasty and distal clavicle excision. No acute fracture or dislocation. Lucent screw tracts within the greater tuberosity from prior rotator cuff repair.   IMPRESSION: Postsurgical changes of prior acromioplasty and distal clavicle excision. No acute fracture or dislocation.     Electronically Signed   By: Neita Garnet M.D.   On: 02/01/2023 15:09  PATIENT SURVEYS:  FOTO 40 with target of 60   COGNITION: Overall cognitive status: Within functional limits for tasks assessed     SENSATION: Light touch: Impaired  numbness and tingling down shoulder into right palm   POSTURE: Rounded shoulders   UPPER EXTREMITY ROM:   Active/ Passive ROM Right eval Left eval  Shoulder flexion NT  180  Shoulder extension    Shoulder abduction NT  180   Shoulder adduction    Shoulder internal rotation    Shoulder external rotation    Elbow flexion    Elbow extension    Wrist flexion    Wrist extension    Wrist ulnar deviation    Wrist radial deviation    Wrist pronation    Wrist supination    (Blank rows = not tested)  UPPER EXTREMITY MMT:  MMT Right eval Left eval  Shoulder flexion NT 5  Shoulder extension    Shoulder abduction NT  5  Shoulder adduction    Shoulder internal rotation    Shoulder external rotation    Middle trapezius    Lower trapezius    Elbow flexion    Elbow extension    Wrist flexion    Wrist extension    Wrist ulnar deviation    Wrist radial deviation    Wrist  pronation    Wrist supination    Grip strength (lbs)    (Blank rows = not tested)    PALPATION:  Not tested    TODAY'S TREATMENT:                                                                                                                                         DATE:   05/10/23 Warmup: pendulums in multiple planes 2x30s Shoulder flexion AAROM towel slides 1x10 Shoulder abduction AAROM towel slides 1x10 Shoulder ER/IR AAROM in pain free range 1x10 Shoulder flexion towel slide AROM 1x6 Shoulder shrugs 1x3  - painful Ice 5:00    05/08/23: Warmup: pendulums in multiple planes 2x30s, Ball Squeezes 1 x 10  Pt educated on weaning off sling as per rehab protocol Shoulder flexion AAROM towel slides 1x10 Shoulder abduction AAROM towel slides 1x10 Shoulder ER/IR AROM in pain free range 1x10  - wrist supported on  table  - feels spasms but no increase in pain Pt educated on cause of pain and its relationship to muscle activation.   05/03/23: Ball Squeezes 1 x 10    05/01/23:  All single UE exercises performed on right  Warmup: pendulums in multiple planes 2x30s  Closed chain pendulums 1x30s  - feel the same as when supported by arm Closed chain shoulder flexion PROM 1x10 with 3 sec holds Closed chain abduction shoulder PROM 1x10 with 3 sec holds Shoulder ER PROM with rod 1x10 with 3 sec holds Shoulder IR PROM with rod 1x10 with 3 sec holds Pt still reporting high levels of pain in shoulder and down arm Hot pack applied to shoulder 5 min Shoulder ER AAROM 1x5 with rod   04/24/23:  All single UE exercises performed on right  Shoulder pendulums flexion and extension on left 1 x 30s  Shoulder pendulums adduction and abduction on left 1 x 30s  Bent over shoulder shrugs - 1x10 with 3 second holds Standing scapula retractions - 2x10 with 3 second holds - min cuing for shoulder blades down and back  Shoulder ER/IR at 0 deg abduction - 1 x 10 with 3 second holds Closed chain  shoulder flexion PROM - 1x10 with 3 second holds Closed chain shoulder abduction PROM - 1x10 with 3 second holds Pt educated on changes to HEP    04/17/23: All single UE exercises performed on right  Shoulder pendulums flexion and extension on left 3 x 10  Shoulder pendulums adduction and abduction on left 3 x 10  Supine Shoulder Retractions 5 sec hold 1 x 10  Shoulder ER/IR at 0 deg abduction 3 x 10  Shoulder AAROM Flex and Ext on Pulleys x 2  -Pt unable to perform due to pain   04/10/23: Cervical Rotation AROM Clockwise 1 x 10  Cervical Rotation AROM Counter Clockwise 1 x 10  Seated Scapular Retraction with 3 sec hold 1 x 10    PATIENT EDUCATION: Education details: Form and technique for correct performance of exercise and use of modalities such as ice to reduce inflammation  Person educated: Patient Education method: Programmer, multimedia, Demonstration, Verbal cues, and Handouts Education comprehension: verbalized understanding, returned demonstration, and verbal cues required  HOME EXERCISE PROGRAM: Access Code: [email or web address] URL: [email or web address] Date: 05/03/2023 Prepared by: Ellin Goodie  Exercises - Towel Roll Grip with Forearm in Neutral  - 1 x daily - 10 reps - 3 sec  hold - Supine Elbow Flexion Extension AROM  - 1 x daily - 3 sets - 10 reps - Standing Horizontal Shoulder Pendulum Supported with Arm Bent  - 1 x daily - 1 sets - 30 reps - Standing Flexion Extension Shoulder Pendulum Supported with Arm Bent  - 1 x daily - 1 sets - 30 reps - Standing Scapular Retraction  - 1 x daily - 2 sets - 10 reps - Seated Forearm Pronation Supination AROM  - 1 x daily - 3 sets - 10 reps   ASSESSMENT:  CLINICAL IMPRESSION: Pt continues to be limited by right shoulder pain with session limited to shoulder AAROM in very small range. PT today focused on AAROM and AROM of right shoulder. Pt was able to use more right shoulder musculature as evidenced by ability to perform some  AROM instead of just AAROM. Pt became limited by pain later in the session, which ice helped with. Pt will benefit from skilled PT to address these aforementioned deficits to return to reaching overhead and lifting objects  to return to driving and performing self care tasks independently.    OBJECTIVE IMPAIRMENTS: decreased ROM, decreased strength, impaired sensation, impaired UE functional use, and pain.   ACTIVITY LIMITATIONS: carrying, lifting, reach over head, and caring for others  PARTICIPATION LIMITATIONS: meal prep, cleaning, laundry, driving, shopping, community activity, and occupation  PERSONAL FACTORS: Fitness, Past/current experiences, Time since onset of injury/illness/exacerbation, and 1-2 comorbidities: HTN and HLD   are also affecting patient's functional outcome.   REHAB POTENTIAL: Fair multiple rotator cuff surgeries   CLINICAL DECISION MAKING: Stable/uncomplicated  EVALUATION COMPLEXITY: Low   GOALS: Goals reviewed with patient? No  SHORT TERM GOALS: Target date: 04/25/2023  PT reviewed the following HEP with patient with patient able to demonstrate a set of the following with min cuing for correction needed. PT educated patient on parameters of therex (how/when to inc/decrease intensity, frequency, rep/set range, stretch hold time, and purpose of therex) with verbalized understanding.  Baseline: NT 04/17/23: Performing independently  Goal status: ONGOING   2.  Patient will demonstrate right shoulder PROM flexion to >=100 degrees and abduction to >=90 degrees as evidence of tissue healing.  Baseline: Shoulder Flex R/L NT/180, Shoulder Abd R/L NT/180  Goal status: ONGOING      LONG TERM GOALS: Target date: 06/20/2023  Patient will have improved function and activity level as evidenced by an increase in FOTO score by 10 points or more.  Baseline: 40  Goal status: ONGOING   2.  Patient will improve right shoulder AROM to be symmetrical with left shoulder AROM  for improved RUE function to perform reaching tasks like grabbing objects off a shelf.  Baseline: Shoulder Flex R/L NT/180, Shoulder Abd R/L NT/180  Goal status: ONGOING   3.  Patient will improve right shoulder strength to  Baseline:  Shoulder Flex R/L NT/5, Shoulder Abd R/L NT/5, Shoulder ER R/L NT/NT, Shoulder IR R/L NT/NT  Goal status: ONGOING    PLAN:  PT FREQUENCY: 1-2x/week  PT DURATION: 10 weeks  PLANNED INTERVENTIONS: 97164- PT Re-evaluation, 97110-Therapeutic exercises, 97530- Therapeutic activity, 97112- Neuromuscular re-education, 97535- Self Care, 16109- Manual therapy, 97014- Electrical stimulation (unattended), [id number]- Electrical stimulation (manual), Patient/Family education, Taping, Dry Needling, Joint mobilization, Joint manipulation, Spinal manipulation, Spinal mobilization, Scar mobilization, DME instructions, Cryotherapy, and Moist heat  PLAN FOR NEXT SESSION: Progress AROM in   [place], Student-PT [employer] DPT Class of 2025

## 2023-05-11 ENCOUNTER — Other Ambulatory Visit (HOSPITAL_COMMUNITY): Payer: Self-pay

## 2023-05-11 ENCOUNTER — Telehealth: Payer: Self-pay

## 2023-05-11 NOTE — Telephone Encounter (Signed)
Pharmacy Patient Advocate Encounter  Received notification from [facility] that Prior Authorization for Qulipta 60MG  tablets has been APPROVED from 05/11/2023 to 05/10/2024. Ran test claim, Copay is $0. This test claim was processed through [facility]- copay amounts may vary at other pharmacies due to pharmacy/plan contracts, or as the patient moves through the different stages of their insurance plan.   PA #/Case ID/Reference #: PA Case ID #: [id number]

## 2023-05-11 NOTE — Telephone Encounter (Signed)
Pharmacy Patient Advocate Encounter   Received notification from CoverMyMeds that prior authorization for Qulipta 60MG  tablets is required/requested.   Insurance verification completed.   The patient is insured through [facility] .   Per test claim: PA required; PA submitted to above mentioned insurance via CoverMyMeds Key/confirmation #/EOC [id number] Status is pending

## 2023-05-15 ENCOUNTER — Encounter (HOSPITAL_BASED_OUTPATIENT_CLINIC_OR_DEPARTMENT_OTHER): Payer: Self-pay | Admitting: Orthopaedic Surgery

## 2023-05-15 ENCOUNTER — Ambulatory Visit: Payer: Commercial Managed Care - PPO | Admitting: Physical Therapy

## 2023-05-16 ENCOUNTER — Other Ambulatory Visit (HOSPITAL_BASED_OUTPATIENT_CLINIC_OR_DEPARTMENT_OTHER): Payer: Self-pay | Admitting: Orthopaedic Surgery

## 2023-05-16 ENCOUNTER — Other Ambulatory Visit (HOSPITAL_COMMUNITY): Payer: Self-pay

## 2023-05-16 DIAGNOSIS — M542 Cervicalgia: Secondary | ICD-10-CM

## 2023-05-17 ENCOUNTER — Other Ambulatory Visit: Payer: Self-pay

## 2023-05-17 ENCOUNTER — Telehealth: Payer: Self-pay | Admitting: Physical Therapy

## 2023-05-17 ENCOUNTER — Encounter: Payer: Self-pay | Admitting: Internal Medicine

## 2023-05-17 ENCOUNTER — Ambulatory Visit: Payer: Commercial Managed Care - PPO | Admitting: Physical Therapy

## 2023-05-17 NOTE — Telephone Encounter (Signed)
Called pt to verify whether she would be at appointment. Pt continues to have severe cervical radicular pain and muscle spasms. This pain has worsened over the past several weeks and it has limited her rehab sessions. Physical therapist discussed holding rehab for now and discussing plan with referring physician to determine best course of action while pt waits for cervical MRI.

## 2023-05-22 ENCOUNTER — Telehealth (HOSPITAL_BASED_OUTPATIENT_CLINIC_OR_DEPARTMENT_OTHER): Payer: Self-pay | Admitting: Orthopaedic Surgery

## 2023-05-22 ENCOUNTER — Ambulatory Visit: Payer: Commercial Managed Care - PPO | Admitting: Physical Therapy

## 2023-05-22 NOTE — Telephone Encounter (Signed)
Patient wants to know if she still needs to come on Wed since she already has MRI sch. Please advise patient phone number [phone]

## 2023-05-24 ENCOUNTER — Encounter: Payer: Commercial Managed Care - PPO | Admitting: Physical Therapy

## 2023-05-24 ENCOUNTER — Encounter (HOSPITAL_BASED_OUTPATIENT_CLINIC_OR_DEPARTMENT_OTHER): Payer: Commercial Managed Care - PPO | Admitting: Orthopaedic Surgery

## 2023-05-29 ENCOUNTER — Encounter: Payer: Self-pay | Admitting: Internal Medicine

## 2023-05-29 ENCOUNTER — Encounter: Payer: Commercial Managed Care - PPO | Admitting: Physical Therapy

## 2023-05-29 NOTE — Telephone Encounter (Signed)
 Care team updated and letter sent for eye exam notes.

## 2023-06-01 ENCOUNTER — Ambulatory Visit: Payer: Commercial Managed Care - PPO

## 2023-06-04 ENCOUNTER — Ambulatory Visit
Admission: RE | Admit: 2023-06-04 | Discharge: 2023-06-04 | Disposition: A | Discharge status: Home / Self Care | Payer: Commercial Managed Care - PPO | Source: Ambulatory Visit | Attending: Orthopaedic Surgery | Admitting: Orthopaedic Surgery

## 2023-06-04 DIAGNOSIS — M47812 Spondylosis without myelopathy or radiculopathy, cervical region: Secondary | ICD-10-CM | POA: Diagnosis not present

## 2023-06-04 DIAGNOSIS — M542 Cervicalgia: Secondary | ICD-10-CM

## 2023-06-05 ENCOUNTER — Ambulatory Visit: Payer: Commercial Managed Care - PPO | Admitting: Physical Therapy

## 2023-06-05 ENCOUNTER — Telehealth: Payer: Self-pay | Admitting: Orthopaedic Surgery

## 2023-06-05 DIAGNOSIS — Z9889 Other specified postprocedural states: Secondary | ICD-10-CM

## 2023-06-05 DIAGNOSIS — M25511 Pain in right shoulder: Secondary | ICD-10-CM

## 2023-06-05 DIAGNOSIS — M542 Cervicalgia: Secondary | ICD-10-CM

## 2023-06-05 NOTE — Therapy (Signed)
OUTPATIENT PHYSICAL THERAPY SHOULDER TREATMENT   Patient Name: [name] MRN: [id number] DOB:Apr 06, 1971, 52 y.o., female Today's Date: 06/05/2023  END OF SESSION:  PT End of Session - 06/05/23 1626     Visit Number 8    Number of Visits 20    Date for PT Re-Evaluation 06/19/23    Authorization Type Lanetta Inch 2024    Authorization Time Period 25 PT/OT    Authorization - Visit Number 8    Authorization - Number of Visits 20    Progress Note Due on Visit 10    PT Start Time 1600    PT Stop Time 1645    PT Time Calculation (min) 45 min    Activity Tolerance Patient tolerated treatment well    Behavior During Therapy WFL for tasks assessed/performed                Past Medical History:  Diagnosis Date   Allergy    Anemia    Anxiety    claustrophobic   Asthma    Back pain    Biceps tendonosis of right shoulder    Cataract    Mixed OU   COVID-19    covid PNA hospitalized 2020, 06/06/21   Diabetes mellitus 2011   did not start metforfin, losing weight   Difficult intubation    tooth got chipped one time   Dyspnea    Essential hypertension    Fatty liver    Food allergy    Frozen shoulder    left, had steroid injection 10/05/21   Headache disorder    History of concussion    HLD (hyperlipidemia)    Hypertensive retinopathy    OU   IBS (irritable bowel syndrome)    Infertility, female    Joint pain    Lactose intolerance    Leg edema    Migraines    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   Palpitation    Post-menopausal    Seborrheic dermatitis    Shoulder impingement syndrome, right    Stage 3a chronic kidney disease (CKD) (HCC)    Type 2 diabetes mellitus with complication, with long-term current use of insulin (HCC) 2018   Uveitis    [facility] in 2020 seen specialist in [place] [facility] last seen 11 or 05/2020   Vitamin D deficiency    Past Surgical History:  Procedure Laterality Date   ABDOMINAL HYSTERECTOMY  2006    heavy menses, endometriosis, l oophrectomy   BREAST BIOPSY Right 2018   benign   BREAST EXCISIONAL BIOPSY Right 2003   benign   BREAST EXCISIONAL BIOPSY Right 1999   benign   COLONOSCOPY  10/06/2020   INGUINAL HERNIA REPAIR Left 2003   LEFT OOPHORECTOMY  2006   LUMBAR FUSION  2005   L5-S1   SHOULDER ARTHROSCOPY WITH ROTATOR CUFF REPAIR AND SUBACROMIAL DECOMPRESSION  04/06/2023   Procedure: SHOULDER ARTHROSCOPY WITH ROTATOR CUFF REPAIR , EXTENSIVE DEBRIDEMENT, AND SUBACROMIAL DECOMPRESSION;  Surgeon: Huel Cote, MD;  Location: [facility];  Service: Orthopedics;;   SHOULDER ARTHROSCOPY WITH SUBACROMIAL DECOMPRESSION AND OPEN ROTATOR C Right 07/14/2016   Procedure: RIGHT SHOULDER ARTHROSCOPY WITH SUBACROMIAL DECOMPRESSION, DISTAL CLAVICLE RESECTION AND MINI OPEN ROTATOR CUFF REPAIR, OPEN BICEP TENDODESIS;  Surgeon: Valeria Batman, MD;  Location: [facility];  Service: Orthopedics;  Laterality: Right;   SHOULDER CLOSED REDUCTION Right 09/08/2016   Procedure: RIGHT CLOSED MANIPULATION SHOULDER;  Surgeon: Valeria Batman, MD;  Location: [facility]  [place];  Service: Orthopedics;  Laterality: Right;   TUBAL LIGATION     UPPER GASTROINTESTINAL ENDOSCOPY  10/06/2020   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 09/21/2021   Chronic left shoulder pain 09/21/2021   Fatty infiltration of liver 07/26/2020   Diarrhea, functional 06/22/2020   Uveitis, intermediate, bilateral 09/07/2019   Generalized edema 06/13/2019   Hospital discharge follow-up 06/07/2019   Multinodular goiter 12/05/2017   Microalbuminuria due to type 2 diabetes mellitus (HCC) 11/26/2017   Encounter for preventive health examination 11/26/2017   Menopause syndrome 06/10/2017   Adverse reaction to vaccine, sequela 03/25/2017   Atypical chest  pain 02/27/2017   PSVT (paroxysmal supraventricular tachycardia) (HCC) 11/26/2016   Nontraumatic incomplete tear of right rotator cuff 07/14/2016   AC (acromioclavicular) arthritis 07/14/2016   Impingement syndrome of right shoulder 07/14/2016   Fibrocystic breast changes, right 05/21/2016   Insomnia 10/16/2015   Snoring 07/26/2015   Chronic migraine w/o aura w/o status migrainosus, not intractable 02/24/2015   History of concussion 07/21/2014   Hyperlipidemia associated with type 2 diabetes mellitus (HCC) 06/13/2014   Vitamin D deficiency 05/07/2013   Chronic migraine 11/20/2012   S/P Total Abdominal Hysterectomy and Left Salpingo-oophorectomy 10/20/2011   Headache around the eyes    Essential hypertension 07/20/2011    PCP: Dr. Duncan Dull   REFERRING PROVIDER: Dr. Huel Cote   REFERRING DIAG: Right Rotator Cuff Repair   THERAPY DIAG:  Acute pain of right shoulder  S/P right rotator cuff repair  Cervicalgia  Rationale for Evaluation and Treatment: Rehabilitation  ONSET DATE: 04/06/23  SUBJECTIVE:                                                                                                                                                                                      SUBJECTIVE STATEMENT:  Pt states that she is seeing some improvement in right shoulder with being able to do things like tie her shoe.   PERTINENT HISTORY: Pt reports increased right shoulder pain which has been determined that it has nerve damage. This is the second right shoulder surgery and she did have PT in the past. She had RTC repair on October 31st. 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   PAIN:  Are you having pain? Yes: NPRS scale: 5/10 Pain location: Anterior shoulder and radiates down into middle fingers Pain description: Achy and gnawing  Aggravating factors: Moving Shoulder  Relieving factors: Gabapentin  and polar pack   PRECAUTIONS:  Other: No Shoulder AROM, No Lifting of Objects, No Supporting of Body Weight by Hands   RED FLAGS: None   WEIGHT BEARING RESTRICTIONS: No  FALLS:  Has patient fallen in last 6 months? No  LIVING ENVIRONMENT: Did not ask  Lives with: Not asked  Lives in: Not asked  Stairs: Not asked  Has following equipment at home: Not asked   OCCUPATION: Medical Billing Instructor   PLOF: Independent  PATIENT GOALS: Return to using right shoulder for functional activities like lifting objects and reaching overhead   NEXT MD VISIT: She is not sure   OBJECTIVE:  Note: Objective measures were completed at Evaluation unless otherwise noted.  VITALS BP 118/80  DIAGNOSTIC FINDINGS:  CLINICAL DATA:  Neck and right shoulder pain and tightness. History of right shoulder arthroscopy and subacromial decompression with distal clavicle excision and mini open rotator cuff repair.   EXAM: RIGHT SHOULDER - 2+ VIEW   COMPARISON:  Right shoulder radiographs 06/12/2017   FINDINGS: Normal bone mineralization. Normal alignment. Postsurgical changes of prior acromioplasty and distal clavicle excision. No acute fracture or dislocation. Lucent screw tracts within the greater tuberosity from prior rotator cuff repair.   IMPRESSION: Postsurgical changes of prior acromioplasty and distal clavicle excision. No acute fracture or dislocation.     Electronically Signed   By: Neita Garnet M.D.   On: 02/01/2023 15:09  PATIENT SURVEYS:  FOTO 40 with target of 60   COGNITION: Overall cognitive status: Within functional limits for tasks assessed     SENSATION: Light touch: Impaired  numbness and tingling down shoulder into right palm   POSTURE: Rounded shoulders   UPPER EXTREMITY ROM:   Active/ Passive ROM Right eval Left eval  Shoulder flexion NT  180  Shoulder extension    Shoulder abduction NT  180   Shoulder adduction    Shoulder internal rotation    Shoulder external rotation    Elbow  flexion    Elbow extension    Wrist flexion    Wrist extension    Wrist ulnar deviation    Wrist radial deviation    Wrist pronation    Wrist supination    (Blank rows = not tested)  UPPER EXTREMITY MMT:  MMT Right eval Left eval  Shoulder flexion NT 5  Shoulder extension    Shoulder abduction NT  5  Shoulder adduction    Shoulder internal rotation    Shoulder external rotation    Middle trapezius    Lower trapezius    Elbow flexion    Elbow extension    Wrist flexion    Wrist extension    Wrist ulnar deviation    Wrist radial deviation    Wrist pronation    Wrist supination    Grip strength (lbs)    (Blank rows = not tested)    PALPATION:  Not tested    TODAY'S TREATMENT:  DATE:    06/05/23 All single UE exercises performed on RUE  Seated Shoulder Flexion with green ball at 30 deg incline 1 x 10  -Pt reports increased right shoulder pain  Seated Shoulder Flexion AROM towel slide  x 30  Seated Shoulder Abduction AROM towel slide x 30  Seated Bicep Stretch 3 x 30 sec  with additional right shoulder support using two towels under arm  -min VC to encourage pt to attempt to supinate wrist for added stretch  -Pt reports NRPS of 7/10 for right shoulder as well as numbness and tingling in her 4th and 5th digits   Supine Bicep Stretch 3 x 30 sec with additional right shoulder support using two towels under arm   05/10/23 Warmup: pendulums in multiple planes 2x30s Shoulder flexion AAROM towel slides 1x10 Shoulder abduction AAROM towel slides 1x10 Shoulder ER/IR AAROM in pain free range 1x10 Shoulder flexion towel slide AROM 1x6 Shoulder shrugs 1x3  - painful Ice 5:00    05/08/23: Warmup: pendulums in multiple planes 2x30s, Ball Squeezes 1 x 10  Pt educated on weaning off sling as per rehab protocol Shoulder flexion AAROM towel  slides 1x10 Shoulder abduction AAROM towel slides 1x10 Shoulder ER/IR AROM in pain free range 1x10  - wrist supported on table  - feels spasms but no increase in pain Pt educated on cause of pain and its relationship to muscle activation.        PATIENT EDUCATION: Education details: Form and technique for correct performance of exercise and use of modalities such as ice to reduce inflammation  Person educated: Patient Education method: Explanation, Demonstration, Verbal cues, and Handouts Education comprehension: verbalized understanding, returned demonstration, and verbal cues required  HOME EXERCISE PROGRAM: Access Code: [email or web address] URL: [email or web address] Date: 06/05/2023 Prepared by: Ellin Goodie  Exercises - Standing Scapular Retraction  - 1 x daily - 2 sets - 10 reps - Seated Shoulder Flexion Towel Slide at Table Top  - 1 x daily - 3 sets - 10 reps - Seated Shoulder Abduction Towel Slide at Table Top  - 1 x daily - 3 sets - 10 reps -Supine Bicep Stretch 1 min hold for 3 to 5 min per day    ASSESSMENT:  CLINICAL IMPRESSION: Pt able to achieve improved activity tolerance and right shoulder AROM. She was able to perform an increased volume of shoulder AROM exercises without reaching a NRPS increase of >=3 pts. She does exhibit shortened right biceps with increased pain and discomfort with seated and supine bicep stretch along with radicular symptoms. Pt is following up with orthopedist for ongoing medical evaluation and treatment of right arm nerve pain. PT able to successfully modify exercises in session to avoid severe pain exacerbation by grading motion of exercises and monitoring pt's symptoms while she performed the exercises. She will continue to benefit from skilled PT to address these aforementioned deficits to return to reaching overhead and lifting objects to return to driving and performing self care tasks independently.   OBJECTIVE IMPAIRMENTS:  decreased ROM, decreased strength, impaired sensation, impaired UE functional use, and pain.   ACTIVITY LIMITATIONS: carrying, lifting, reach over head, and caring for others  PARTICIPATION LIMITATIONS: meal prep, cleaning, laundry, driving, shopping, community activity, and occupation  PERSONAL FACTORS: Fitness, Past/current experiences, Time since onset of injury/illness/exacerbation, and 1-2 comorbidities: HTN and HLD   are also affecting patient's functional outcome.   REHAB POTENTIAL: Fair multiple rotator cuff surgeries   CLINICAL DECISION MAKING: Stable/uncomplicated  EVALUATION COMPLEXITY: Low  GOALS: Goals reviewed with patient? No  SHORT TERM GOALS: Target date: 04/25/2023  PT reviewed the following HEP with patient with patient able to demonstrate a set of the following with min cuing for correction needed. PT educated patient on parameters of therex (how/when to inc/decrease intensity, frequency, rep/set range, stretch hold time, and purpose of therex) with verbalized understanding.  Baseline: NT 04/17/23: Performing independently  Goal status: ONGOING   2.  Patient will demonstrate right shoulder PROM flexion to >=100 degrees and abduction to >=90 degrees as evidence of tissue healing.  Baseline: Shoulder Flex R/L NT/180, Shoulder Abd R/L NT/180  Goal status: ONGOING      LONG TERM GOALS: Target date: 06/20/2023  Patient will have improved function and activity level as evidenced by an increase in FOTO score by 10 points or more.  Baseline: 40  Goal status: ONGOING   2.  Patient will improve right shoulder AROM to be symmetrical with left shoulder AROM for improved RUE function to perform reaching tasks like grabbing objects off a shelf.  Baseline: Shoulder Flex R/L NT/180, Shoulder Abd R/L NT/180  Goal status: ONGOING   3.  Patient will improve right shoulder strength to  Baseline:  Shoulder Flex R/L NT/5, Shoulder Abd R/L NT/5, Shoulder ER R/L NT/NT, Shoulder  IR R/L NT/NT  Goal status: ONGOING    PLAN:  PT FREQUENCY: 1-2x/week  PT DURATION: 10 weeks  PLANNED INTERVENTIONS: 97164- PT Re-evaluation, 97110-Therapeutic exercises, 97530- Therapeutic activity, 97112- Neuromuscular re-education, 97535- Self Care, 11914- Manual therapy, 97014- Electrical stimulation (unattended), [id number]- Electrical stimulation (manual), Patient/Family education, Taping, Dry Needling, Joint mobilization, Joint manipulation, Spinal manipulation, Spinal mobilization, Scar mobilization, DME instructions, Cryotherapy, and Moist heat  PLAN FOR NEXT SESSION: Reassess goals. Right bicep massage to relieve muscular tension. Continue with shoulder ROM exercises and attempt shoulder isometrics to initiate strengthening.

## 2023-06-05 NOTE — Telephone Encounter (Signed)
Pt called in requesting if she can be worked in on 1/8 she cannot make it on 1/6 she would not be in town and was not considering that when we called to give her the appt time and date, please advise

## 2023-06-05 NOTE — Telephone Encounter (Signed)
[facility] offering [facility] video visit appointment on the 8th as there is no availability the 6th and left call back number to confirm if that is okay

## 2023-06-08 ENCOUNTER — Ambulatory Visit: Payer: Commercial Managed Care - PPO | Admitting: Physical Therapy

## 2023-06-12 ENCOUNTER — Encounter (HOSPITAL_BASED_OUTPATIENT_CLINIC_OR_DEPARTMENT_OTHER): Payer: Commercial Managed Care - PPO | Admitting: Orthopaedic Surgery

## 2023-06-14 ENCOUNTER — Encounter (HOSPITAL_BASED_OUTPATIENT_CLINIC_OR_DEPARTMENT_OTHER): Payer: Self-pay | Admitting: Orthopaedic Surgery

## 2023-06-14 ENCOUNTER — Telehealth (HOSPITAL_BASED_OUTPATIENT_CLINIC_OR_DEPARTMENT_OTHER): Payer: Commercial Managed Care - PPO | Admitting: Orthopaedic Surgery

## 2023-06-14 ENCOUNTER — Other Ambulatory Visit (HOSPITAL_BASED_OUTPATIENT_CLINIC_OR_DEPARTMENT_OTHER): Payer: Self-pay | Admitting: Orthopaedic Surgery

## 2023-06-14 DIAGNOSIS — S46011A Strain of muscle(s) and tendon(s) of the rotator cuff of right shoulder, initial encounter: Secondary | ICD-10-CM

## 2023-06-14 NOTE — Progress Notes (Signed)
 Post Operative Evaluation    Procedure/Date of Surgery:  right rotator cuff repair 10/31  Interval History:    Presents today status post the above procedure for a phone call discussion of her MRI of her cervical spine.  She is still having persistent numbness in the right hand.  She has previously had a EMG nerve conduction test with Dr. Eldonna which was negative in 2019.   PMH/PSH/Family History/Social History/Meds/Allergies:    Past Medical History:  Diagnosis Date   Allergy    Anemia    Anxiety    claustrophobic   Asthma    Back pain    Biceps tendonosis of right shoulder    Cataract    Mixed OU   COVID-19    covid PNA hospitalized 2020, 06/06/21   Diabetes mellitus 2011   did not start metforfin, losing weight   Difficult intubation    tooth got chipped one time   Dyspnea    Essential hypertension    Fatty liver    Food allergy    Frozen shoulder    left, had steroid injection 10/05/21   Headache disorder    History of concussion    HLD (hyperlipidemia)    Hypertensive retinopathy    OU   IBS (irritable bowel syndrome)    Infertility, female    Joint pain    Lactose intolerance    Leg edema    Migraines    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   Palpitation    Post-menopausal    Seborrheic dermatitis    Shoulder impingement syndrome, right    Stage 3a chronic kidney disease (CKD) (HCC)    Type 2 diabetes mellitus with complication, with long-term current use of insulin  (HCC) 2018   Uveitis    [place] [facility] in 2020 seen specialist in [place] [facility] last seen 11 or 05/2020   Vitamin D  deficiency    Past Surgical History:  Procedure Laterality Date   ABDOMINAL HYSTERECTOMY  2006   heavy menses, endometriosis, l oophrectomy   BREAST BIOPSY Right 2018   benign   BREAST EXCISIONAL BIOPSY Right 2003   benign   BREAST EXCISIONAL BIOPSY Right 1999   benign   COLONOSCOPY  10/06/2020   INGUINAL HERNIA  REPAIR Left 2003   LEFT OOPHORECTOMY  2006   LUMBAR FUSION  2005   L5-S1   SHOULDER ARTHROSCOPY WITH ROTATOR CUFF REPAIR AND SUBACROMIAL DECOMPRESSION  04/06/2023   Procedure: SHOULDER ARTHROSCOPY WITH ROTATOR CUFF REPAIR , EXTENSIVE DEBRIDEMENT, AND SUBACROMIAL DECOMPRESSION;  Surgeon: Genelle Standing, MD;  Location: [facility];  Service: Orthopedics;;   SHOULDER ARTHROSCOPY WITH SUBACROMIAL DECOMPRESSION AND OPEN ROTATOR C Right 07/14/2016   Procedure: RIGHT SHOULDER ARTHROSCOPY WITH SUBACROMIAL DECOMPRESSION, DISTAL CLAVICLE RESECTION AND MINI OPEN ROTATOR CUFF REPAIR, OPEN BICEP TENDODESIS;  Surgeon: Maude LELON Right, MD;  Location: [facility];  Service: Orthopedics;  Laterality: Right;   SHOULDER CLOSED REDUCTION Right 09/08/2016   Procedure: RIGHT CLOSED MANIPULATION SHOULDER;  Surgeon: Maude LELON Right, MD;  Location:  SURGERY CENTER;  Service: Orthopedics;  Laterality: Right;   TUBAL LIGATION     UPPER GASTROINTESTINAL ENDOSCOPY  10/06/2020   Social History   Socioeconomic History   Marital status: Divorced    Spouse name: Not on file   Number  of children: 2   Years of education: 14   Highest education level: Master's degree (e.g., MA, MS, MEng, MEd, MSW, MBA)  Occupational History   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   Right-handed.   1-3 cups caffeine  weekly.   Social Drivers of Corporate Investment Banker Strain: Not on file  Food Insecurity: Not on file  Transportation Needs: Not on file  Physical Activity: Not on file  Stress: Not on file  Social Connections: Not  on file   Family History  Problem Relation Age of Onset   Diabetes Mother    Heart disease Mother 32   Hypertension Mother    Hyperlipidemia Mother    Heart failure Mother    Stroke Mother    Thyroid  disease Mother    Depression Mother    Sleep apnea Mother    Obesity Mother    Colon polyps Mother    Cancer Father 54       Lung Cancer   Heart disease Father    Mental illness Sister        bipolar, substance abuse,  clean 2 yrs   Hypertension Sister    Cancer Paternal Grandmother 67       breast cancer   Breast cancer Paternal Grandmother    Diabetes Maternal Grandmother    Hypertension Maternal Grandmother    Diabetes Son    Colon cancer Neg Hx    Esophageal cancer Neg Hx    Rectal cancer Neg Hx    Stomach cancer Neg Hx    Allergies  Allergen Reactions   Bamlanivimab  Anaphylaxis, Itching, Palpitations, Rash, Shortness Of Breath and Swelling   Botox [Onabotulinumtoxina ] Shortness Of Breath    syncope   Clindamycin/Lincomycin Other (See Comments)   Contrast Media [Iodinated Contrast Media] Shortness Of Breath and Palpitations   Kiwi Extract Shortness Of Breath   Maxalt [Rizatriptan Benzoate] Anaphylaxis    Chest pain   Nitrous Oxide Shortness Of Breath    syncope   Rizatriptan Benzoate Anaphylaxis    Chest pain   Triptans Shortness Of Breath   Vyepti  [Eptinezumab -Jjmr] Shortness Of Breath, Palpitations and Other (See Comments)    Throat soreness, tachycardia   Aspirin  Nausea And Vomiting    Mouth blisters   Haemophilus Influenzae Vaccines    Latex     Sometimes causes rash, welts   Mango Flavoring Agent (Non-Screening) Swelling    Lips swell   Vicodin [Hydrocodone-Acetaminophen ]     hallucinations   Erythromycin Rash   Flagyl  [Metronidazole ] Rash   Influenza Virus Vaccine Palpitations   Penicillins Nausea And Vomiting, Rash and Other (See Comments)    Did it involve swelling of the face/tongue/throat, SOB, or low BP? No Did it involve sudden or severe  rash/hives, skin peeling, or any reaction on the inside of your mouth or nose? No Did you need to seek medical attention at a hospital or doctor's office? Yes When did it last happen?     patient was 53 years old  If all  above answers are "NO", may proceed with cephalosporin use.   Tetracyclines & Related Nausea And Vomiting and Rash   Current Outpatient Medications  Medication Sig Dispense Refill   gabapentin  (NEURONTIN ) 100 MG capsule Take 1 capsule (100 mg total) by mouth 3 (three) times daily. 30 capsule 1   albuterol  (VENTOLIN  HFA) 108 (90 Base) MCG/ACT inhaler Inhale 2 puffs into the lungs every 6 (six) hours as needed for wheezing or shortness of breath. 18 g 0   aspirin  EC 81 MG tablet Take 1 tablet (81 mg total) by mouth daily. Swallow whole. 90 tablet 3   Atogepant  (QULIPTA ) 60 MG TABS Take 1 tablet (60 mg total) by mouth daily. (Patient taking differently: Take 1 tablet by mouth at bedtime.) 30 tablet 11   atorvastatin  (LIPITOR) 20 MG tablet Take 1 tablet (20 mg total) by mouth daily. (Patient taking differently: Take 20 mg by mouth at bedtime.) 90 tablet 3   butalbital -acetaminophen -caffeine  (FIORICET ) 50-325-40 MG tablet Take 1 tablet by mouth every 6 (six) hours as needed for headache. Do not refill in less than 30 day 30 tablet 5   Continuous Glucose Sensor (DEXCOM G7 SENSOR) MISC Apply 1 sensor every 10 days. (Patient not taking: Reported on 04/12/2023) 9 each 4   diazepam  (VALIUM ) 5 MG tablet Take 1 tablet (5 mg total) by mouth every 12 (twelve) hours as needed for anxiety. (Patient taking differently: Take 5 mg by mouth every 12 (twelve) hours as needed (migraines).) 30 tablet 5   dicyclomine  (BENTYL ) 20 MG tablet Take 1 tablet (20 mg total) by mouth every 6 (six) hours. (Patient taking differently: Take 20 mg by mouth 3 (three) times daily as needed.) 90 tablet 1   EPINEPHrine  0.3 mg/0.3 mL IJ SOAJ injection Inject 0.3 mg into the muscle as needed for anaphylaxis. 2 each 0    fluconazole  (DIFLUCAN ) 150 MG tablet Take 1 tablet (150 mg total) by mouth daily. 5 tablet 0   Ipratropium-Albuterol  (COMBIVENT ) 20-100 MCG/ACT AERS respimat Inhale 1 puff into the lungs every 6 (six) hours. (Patient taking differently: Inhale 1 puff into the lungs every 6 (six) hours as needed for shortness of breath.) 4 g 11   losartan -hydrochlorothiazide  (HYZAAR ) 50-12.5 MG tablet Take 1 tablet by mouth daily. (Patient taking differently: Take 1 tablet by mouth at bedtime.) 90 tablet 1   meloxicam  (MOBIC ) 15 MG tablet Take 1 tablet (15 mg total) by mouth daily. 14 tablet 0   metFORMIN  (GLUCOPHAGE -XR) 500 MG 24 hr tablet Take 2 tablets (1,000 mg total) by mouth 2 (two) times daily with a meal. 1000mg  at breakfast and 1000 mg at dinner 96 tablet 0   metoprolol  succinate (TOPROL -XL) 25 MG 24 hr tablet Take 0.5 tablets (12.5 mg total) by mouth daily. (Patient taking differently: Take 12.5 mg by mouth at bedtime.) 45 tablet 0   ondansetron  (ZOFRAN -ODT) 4 MG disintegrating tablet Take 1 tablet (4 mg total) by mouth every 8 (eight) hours as needed for nausea or vomiting. 20 tablet 0   oxyCODONE  (ROXICODONE ) 5 MG immediate release tablet Take 1 tablet (5 mg total) by mouth every 4 (four) hours as needed for severe pain (pain score 7-10) or breakthrough pain. 15 tablet 0   Semaglutide , 1 MG/DOSE, 4 MG/3ML SOPN Inject 1 mg into the skin once a week as directed. (Patient taking differently: Inject 1 mg as directed once a week. Sunday) 9 mL 1   Ubrogepant  (UBRELVY ) 100 MG TABS Take 1 tablet (100 mg total) by mouth  as needed. May repeat dose in 2 hours if needed. Max dose 2 tablets in 2 hours 16 tablet 11   Vitamin D , Ergocalciferol , (DRISDOL ) 1.25 MG (50000 UNIT) CAPS capsule Take 1 capsule by mouth once a week. (Patient taking differently: Take 50,000 Units by mouth once a week. Sunday) 4 capsule 11   No current facility-administered medications for this visit.   No results found.  Review of Systems:   A  ROS was performed including pertinent positives and negatives as documented in the HPI.   Musculoskeletal Exam:    There were no vitals taken for this visit.  Right shoulder incisions are well-appearing without erythema or drainage.  She does have paresthesias down the middle finger and ring finger of the right hand.  She is able to fire wrist extensors as well as thumb extensor.  She is able to fire at the biceps.  Remainder of sensory exam is intact.  Imaging:     MRI cervical spine: normal  I personally reviewed and interpreted the radiographs.   Assessment:   6 weeks status post right shoulder rotator cuff repair.  At this time she is having persistent numbness in the right hand which I do believe he may be evidence of carpal tunnel syndrome.  That has been 6 years since her previous EMG nerve conduction test at this time I would like to repeat this to rule out any type of progressing nerve impingement.  At this time her MRI does not reveal any evidence of a cervical radiculopathy. Plan :    -Return to clinic 3 weeks for further assessment of the shoulder as well as possible discussion of EMG nerve conduction test. -Plan for referral to Dr. Prentice Masters for EMG nerve conduction test of the right upper extremity      I personally saw and evaluated the patient, and participated in the management and treatment plan.  Elspeth Parker, MD Attending Physician, Orthopedic Surgery  This document was dictated using Dragon voice recognition software. A reasonable attempt at proof reading has been made to minimize errors.

## 2023-06-20 ENCOUNTER — Other Ambulatory Visit: Payer: Self-pay

## 2023-06-27 ENCOUNTER — Ambulatory Visit
Admission: RE | Admit: 2023-06-27 | Discharge: 2023-06-27 | Disposition: A | Discharge status: Home / Self Care | Payer: Commercial Managed Care - PPO | Source: Ambulatory Visit | Attending: Internal Medicine | Admitting: Internal Medicine

## 2023-06-27 ENCOUNTER — Ambulatory Visit (INDEPENDENT_AMBULATORY_CARE_PROVIDER_SITE_OTHER): Payer: Commercial Managed Care - PPO | Admitting: Physical Medicine and Rehabilitation

## 2023-06-27 DIAGNOSIS — R202 Paresthesia of skin: Secondary | ICD-10-CM | POA: Diagnosis not present

## 2023-06-27 DIAGNOSIS — M7541 Impingement syndrome of right shoulder: Secondary | ICD-10-CM | POA: Diagnosis not present

## 2023-06-27 DIAGNOSIS — R531 Weakness: Secondary | ICD-10-CM | POA: Diagnosis not present

## 2023-06-27 DIAGNOSIS — M25531 Pain in right wrist: Secondary | ICD-10-CM

## 2023-06-27 DIAGNOSIS — Z1231 Encounter for screening mammogram for malignant neoplasm of breast: Secondary | ICD-10-CM

## 2023-06-27 DIAGNOSIS — R94130 Abnormal response to nerve stimulation, unspecified: Secondary | ICD-10-CM | POA: Diagnosis not present

## 2023-06-28 ENCOUNTER — Encounter (HOSPITAL_BASED_OUTPATIENT_CLINIC_OR_DEPARTMENT_OTHER): Payer: Self-pay | Admitting: Orthopaedic Surgery

## 2023-06-28 ENCOUNTER — Telehealth (HOSPITAL_BASED_OUTPATIENT_CLINIC_OR_DEPARTMENT_OTHER): Payer: Commercial Managed Care - PPO | Admitting: Orthopaedic Surgery

## 2023-06-28 DIAGNOSIS — G5601 Carpal tunnel syndrome, right upper limb: Secondary | ICD-10-CM

## 2023-06-28 DIAGNOSIS — S46011A Strain of muscle(s) and tendon(s) of the rotator cuff of right shoulder, initial encounter: Secondary | ICD-10-CM

## 2023-06-28 NOTE — Procedures (Signed)
EMG & NCV Findings: Evaluation of the right median motor nerve showed reduced amplitude (3.4 mV).  The right median (across palm) sensory nerve showed prolonged distal peak latency (Wrist, 4.1 ms) and prolonged distal peak latency (Palm, 2.3 ms).  All remaining nerves (as indicated in the following tables) were within normal limits.    Impression: The above electrodiagnostic study is ABNORMAL and reveals evidence of a mild right median nerve entrapment at the wrist (carpal tunnel syndrome) affecting sensory components.  This represents a slight worsening since the last electrodiagnostic study.  **This electrodiagnostic study cannot rule out small fiber polyneuropathy and dysesthesias from central pain syndromes such as stroke or central pain sensitization syndromes such as fibromyalgia.  Myotomal referral pain from trigger points is also not excluded.   Recommendations: 1.  Follow-up with referring physician. 2.  Continue current management of symptoms.  ___________________________ Naaman Plummer FAAPMR Board Certified, American Board of Physical Medicine and Rehabilitation    Nerve Conduction Studies Anti Sensory Summary Table   Stim Site NR Peak (ms) Norm Peak (ms) P-T Amp (V) Norm P-T Amp Site1 Site2 Delta-P (ms) Dist (cm) Vel (m/s) Norm Vel (m/s)  Right Median Acr Palm Anti Sensory (2nd Digit)  29.9C  Wrist    *4.1 <3.6 24.0 >10 Wrist Palm 1.8 0.0    Palm    *2.3 <2.0 28.7         Right Radial Anti Sensory (Base 1st Digit)  29.9C  Wrist    2.3 <3.1 37.8  Wrist Base 1st Digit 2.3 0.0    Right Ulnar Anti Sensory (5th Digit)  30.3C  Wrist    3.4 <3.7 23.3 >15.0 Wrist 5th Digit 3.4 14.0 41 >38   Motor Summary Table   Stim Site NR Onset (ms) Norm Onset (ms) O-P Amp (mV) Norm O-P Amp Site1 Site2 Delta-0 (ms) Dist (cm) Vel (m/s) Norm Vel (m/s)  Right Median Motor (Abd Poll Brev)  30.4C  Wrist    3.9 <4.2 *3.4 >5 Elbow Wrist 4.2 21.5 51 >50  Elbow    8.1  4.1         Right Ulnar Motor  (Abd Dig Min)  30.5C  Wrist    3.4 <4.2 8.9 >3 B Elbow Wrist 3.4 21.5 63 >53  B Elbow    6.8  8.4  A Elbow B Elbow 1.7 11.0 65 >53  A Elbow    8.5  8.3           Nerve Conduction Studies Anti Sensory Left/Right Comparison   Stim Site L Lat (ms) R Lat (ms) L-R Lat (ms) L Amp (V) R Amp (V) L-R Amp (%) Site1 Site2 L Vel (m/s) R Vel (m/s) L-R Vel (m/s)  Median Acr Palm Anti Sensory (2nd Digit)  29.9C  Wrist  *4.1   24.0  Wrist Palm     Palm  *2.3   28.7        Radial Anti Sensory (Base 1st Digit)  29.9C  Wrist  2.3   37.8  Wrist Base 1st Digit     Ulnar Anti Sensory (5th Digit)  30.3C  Wrist  3.4   23.3  Wrist 5th Digit  41    Motor Left/Right Comparison   Stim Site L Lat (ms) R Lat (ms) L-R Lat (ms) L Amp (mV) R Amp (mV) L-R Amp (%) Site1 Site2 L Vel (m/s) R Vel (m/s) L-R Vel (m/s)  Median Motor (Abd Poll Brev)  30.4C  Wrist  3.9   *  3.4  Elbow Wrist  51   Elbow  8.1   4.1        Ulnar Motor (Abd Dig Min)  30.5C  Wrist  3.4   8.9  B Elbow Wrist  63   B Elbow  6.8   8.4  A Elbow B Elbow  65   A Elbow  8.5   8.3           Waveforms:

## 2023-06-28 NOTE — Progress Notes (Signed)
Post Operative Evaluation    Procedure/Date of Surgery:  right rotator cuff repair 10/31  Interval History:   Presents today for continued status post right shoulder rotator repair recovery.  Overall she is continuing to progress although she has had physical therapy held today [facility].  She is status post EMG nerve conduction test with Dr. Alvester Morin the day prior in which she did find some motor deficits  PMH/PSH/Family History/Social History/Meds/Allergies:    Past Medical History:  Diagnosis Date   Allergy    Anemia    Anxiety    claustrophobic   Asthma    Back pain    Biceps tendonosis of right shoulder    Cataract    Mixed OU   COVID-19    covid PNA hospitalized 2020, 06/06/21   Diabetes mellitus 2011   did not start metforfin, losing weight   Difficult intubation    tooth got chipped one time   Dyspnea    Essential hypertension    Fatty liver    Food allergy    Frozen shoulder    left, had steroid injection 10/05/21   Headache disorder    History of concussion    HLD (hyperlipidemia)    Hypertensive retinopathy    OU   IBS (irritable bowel syndrome)    Infertility, female    Joint pain    Lactose intolerance    Leg edema    Migraines    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   Palpitation    Post-menopausal    Seborrheic dermatitis    Shoulder impingement syndrome, right    Stage 3a chronic kidney disease (CKD) (HCC)    Type 2 diabetes mellitus with complication, with long-term current use of insulin (HCC) 2018   Uveitis    [facility] in 2020 seen specialist in [place] [facility] last seen 11 or 05/2020   Vitamin D deficiency    Past Surgical History:  Procedure Laterality Date   ABDOMINAL HYSTERECTOMY  2006   heavy menses, endometriosis, l oophrectomy   BREAST BIOPSY Right 2018   benign   BREAST EXCISIONAL BIOPSY Right 2003   benign   BREAST EXCISIONAL BIOPSY Right 1999   benign   COLONOSCOPY   10/06/2020   INGUINAL HERNIA REPAIR Left 2003   LEFT OOPHORECTOMY  2006   LUMBAR FUSION  2005   L5-S1   SHOULDER ARTHROSCOPY WITH ROTATOR CUFF REPAIR AND SUBACROMIAL DECOMPRESSION  04/06/2023   Procedure: SHOULDER ARTHROSCOPY WITH ROTATOR CUFF REPAIR , EXTENSIVE DEBRIDEMENT, AND SUBACROMIAL DECOMPRESSION;  Surgeon: Huel Cote, MD;  Location: [facility];  Service: Orthopedics;;   SHOULDER ARTHROSCOPY WITH SUBACROMIAL DECOMPRESSION AND OPEN ROTATOR C Right 07/14/2016   Procedure: RIGHT SHOULDER ARTHROSCOPY WITH SUBACROMIAL DECOMPRESSION, DISTAL CLAVICLE RESECTION AND MINI OPEN ROTATOR CUFF REPAIR, OPEN BICEP TENDODESIS;  Surgeon: Valeria Batman, MD;  Location: [facility];  Service: Orthopedics;  Laterality: Right;   SHOULDER CLOSED REDUCTION Right 09/08/2016   Procedure: RIGHT CLOSED MANIPULATION SHOULDER;  Surgeon: Valeria Batman, MD;  Location: [facility];  Service: Orthopedics;  Laterality: Right;   TUBAL LIGATION     UPPER GASTROINTESTINAL ENDOSCOPY  10/06/2020   Social History   Socioeconomic History   Marital status: Divorced    Spouse name: Not on file   Number  of children: 2   Years of education: 14   Highest education level: Master's degree (e.g., MA, MS, MEng, MEd, MSW, MBA)  Occupational History   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   Right-handed.   1-3 cups caffeine weekly.   Social Drivers of Corporate investment banker Strain: Not on file  Food Insecurity: Not on file  Transportation Needs: Not on file  Physical Activity: Not on file  Stress: Not on  file  Social Connections: Not on file   Family History  Problem Relation Age of Onset   Diabetes Mother    Heart disease Mother 4   Hypertension Mother    Hyperlipidemia Mother    Heart failure Mother    Stroke Mother    Thyroid disease Mother    Depression Mother    Sleep apnea Mother    Obesity Mother    Colon polyps Mother    Cancer Father 4       Lung Cancer   Heart disease Father    Mental illness Sister        bipolar, substance abuse,  clean 2 yrs   Hypertension Sister    Cancer Paternal Grandmother 37       breast cancer   Breast cancer Paternal Grandmother    Diabetes Maternal Grandmother    Hypertension Maternal Grandmother    Diabetes Son    Colon cancer Neg Hx    Esophageal cancer Neg Hx    Rectal cancer Neg Hx    Stomach cancer Neg Hx    Allergies  Allergen Reactions   Bamlanivimab Anaphylaxis, Itching, Palpitations, Rash, Shortness Of Breath and Swelling   Botox [Onabotulinumtoxina] Shortness Of Breath    syncope   Clindamycin/Lincomycin Other (See Comments)   Contrast Media [Iodinated Contrast Media] Shortness Of Breath and Palpitations   Kiwi Extract Shortness Of Breath   Maxalt [Rizatriptan Benzoate] Anaphylaxis    Chest pain   Nitrous Oxide Shortness Of Breath    syncope   Rizatriptan Benzoate Anaphylaxis    Chest pain   Triptans Shortness Of Breath   Vyepti [Eptinezumab-Jjmr] Shortness Of Breath, Palpitations and Other (See Comments)    Throat soreness, tachycardia   Aspirin Nausea And Vomiting    Mouth blisters   Haemophilus Influenzae Vaccines    Latex     Sometimes causes rash, welts   Mango Flavoring Agent (Non-Screening) Swelling    Lips swell   Vicodin [Hydrocodone-Acetaminophen]     hallucinations   Erythromycin Rash   Flagyl [Metronidazole] Rash   Influenza Virus Vaccine Palpitations   Penicillins Nausea And Vomiting, Rash and Other (See Comments)    Did it involve swelling of the face/tongue/throat, SOB, or low BP? No Did  it involve sudden or severe rash/hives, skin peeling, or any reaction on the inside of your mouth or nose? No Did you need to seek medical attention at a hospital or doctor's office? Yes When did it last happen?     patient was 53 years old  If all  above answers are "NO", may proceed with cephalosporin use.   Tetracyclines & Related Nausea And Vomiting and Rash   Current Outpatient Medications  Medication Sig Dispense Refill   gabapentin (NEURONTIN) 100 MG capsule Take 1 capsule (100 mg total) by mouth 3 (three) times daily. 30 capsule 1   albuterol (VENTOLIN HFA) 108 (90 Base) MCG/ACT inhaler Inhale 2 puffs into the lungs every 6 (six) hours as needed for wheezing or shortness of breath. 18 g 0   aspirin EC 81 MG tablet Take 1 tablet (81 mg total) by mouth daily. Swallow whole. 90 tablet 3   Atogepant (QULIPTA) 60 MG TABS Take 1 tablet (60 mg total) by mouth daily. (Patient taking differently: Take 1 tablet by mouth at bedtime.) 30 tablet 11   atorvastatin (LIPITOR) 20 MG tablet Take 1 tablet (20 mg total) by mouth daily. (Patient taking differently: Take 20 mg by mouth at bedtime.) 90 tablet 3   butalbital-acetaminophen-caffeine (FIORICET) 50-325-40 MG tablet Take 1 tablet by mouth every 6 (six) hours as needed for headache. Do not refill in less than 30 day 30 tablet 5   Continuous Glucose Sensor (DEXCOM G7 SENSOR) MISC Apply 1 sensor every 10 days. (Patient not taking: Reported on 04/12/2023) 9 each 4   diazepam (VALIUM) 5 MG tablet Take 1 tablet (5 mg total) by mouth every 12 (twelve) hours as needed for anxiety. (Patient taking differently: Take 5 mg by mouth every 12 (twelve) hours as needed (migraines).) 30 tablet 5   dicyclomine (BENTYL) 20 MG tablet Take 1 tablet (20 mg total) by mouth every 6 (six) hours. (Patient taking differently: Take 20 mg by mouth 3 (three) times daily as needed.) 90 tablet 1   EPINEPHrine 0.3 mg/0.3 mL IJ SOAJ injection Inject 0.3 mg into the muscle as needed for  anaphylaxis. 2 each 0   fluconazole (DIFLUCAN) 150 MG tablet Take 1 tablet (150 mg total) by mouth daily. 5 tablet 0   Ipratropium-Albuterol (COMBIVENT) 20-100 MCG/ACT AERS respimat Inhale 1 puff into the lungs every 6 (six) hours. (Patient taking differently: Inhale 1 puff into the lungs every 6 (six) hours as needed for shortness of breath.) 4 g 11   losartan-hydrochlorothiazide (HYZAAR) 50-12.5 MG tablet Take 1 tablet by mouth daily. (Patient taking differently: Take 1 tablet by mouth at bedtime.) 90 tablet 1   meloxicam (MOBIC) 15 MG tablet Take 1 tablet (15 mg total) by mouth daily. 14 tablet 0   metFORMIN (GLUCOPHAGE-XR) 500 MG 24 hr tablet Take 2 tablets (1,000 mg total) by mouth 2 (two) times daily with a meal. 1000mg  at breakfast and 1000 mg at dinner 96 tablet 0   metoprolol succinate (TOPROL-XL) 25 MG 24 hr tablet Take 0.5 tablets (12.5 mg total) by mouth daily. (Patient taking differently: Take 12.5 mg by mouth at bedtime.) 45 tablet 0   ondansetron (ZOFRAN-ODT) 4 MG disintegrating tablet Take 1 tablet (4 mg total) by mouth every 8 (eight) hours as needed for nausea or vomiting. 20 tablet 0   oxyCODONE (ROXICODONE) 5 MG immediate release tablet Take 1 tablet (5 mg total) by mouth every 4 (four) hours as needed for severe pain (pain score 7-10) or breakthrough pain. 15 tablet 0   Semaglutide, 1 MG/DOSE, 4 MG/3ML SOPN Inject 1 mg into the skin once a week as directed. (Patient taking differently: Inject 1 mg as directed once a week. Sunday) 9 mL 1   Ubrogepant (UBRELVY) 100 MG TABS Take 1 tablet (100 mg total) by mouth  as needed. May repeat dose in 2 hours if needed. Max dose 2 tablets in 2 hours 16 tablet 11   Vitamin D, Ergocalciferol, (DRISDOL) 1.25 MG (50000 UNIT) CAPS capsule Take 1 capsule by mouth once a week. (Patient taking differently: Take 50,000 Units by mouth once a week. Sunday) 4 capsule 11   No current facility-administered medications for this visit.   MM 3D SCREENING  MAMMOGRAM BILATERAL BREAST Result Date: 06/27/2023 CLINICAL DATA:  Screening. EXAM: DIGITAL SCREENING BILATERAL MAMMOGRAM WITH TOMOSYNTHESIS AND CAD TECHNIQUE: Bilateral screening digital craniocaudal and mediolateral oblique mammograms were obtained. Bilateral screening digital breast tomosynthesis was performed. The images were evaluated with computer-aided detection. COMPARISON:  Previous exam(s). ACR Breast Density Category b: There are scattered areas of fibroglandular density. FINDINGS: There are no findings suspicious for malignancy. IMPRESSION: No mammographic evidence of malignancy. A result letter of this screening mammogram will be mailed directly to the patient. RECOMMENDATION: Screening mammogram in one year. (Code:SM-B-01Y) BI-RADS CATEGORY  1: Negative. Electronically Signed   By: Hulan Saas M.D.   On: 06/27/2023 16:29    Review of Systems:   A ROS was performed including pertinent positives and negatives as documented in the HPI.   Musculoskeletal Exam:    There were no vitals taken for this visit.  Right shoulder incisions are well-appearing without erythema or drainage.  She does have paresthesias down the middle finger and ring finger of the right hand.  She is able to fire wrist extensors as well as thumb extensor.  She is able to fire at the biceps.  Remainder of sensory exam is intact.  Positive Tinel and Phalen right carpal tunnel  Imaging:     MRI cervical spine: normal  I personally reviewed and interpreted the radiographs.   Assessment:   10 weeks status post right shoulder rotator cuff repair.  EMG with evidence of motor deficit and side effect I do believe she might ultimately be a candidate for carpal tunnel release.  I will plan to refer her to Dr. Denese Killings for discussion of this.  That being said she is still somewhat stagnated with recovery with the rotator cuff.  Given this we will plan to keep her on her work from home program and I would like her to  transition to physical therapy here at the [facility].  I will see her back in 1 month for reassessment Plan :    -Return to clinic 1 month for reassessment      I personally saw and evaluated the patient, and participated in the management and treatment plan.  Huel Cote, MD Attending Physician, Orthopedic Surgery  This document was dictated using Dragon voice recognition software. A reasonable attempt at proof reading has been made to minimize errors.

## 2023-07-05 ENCOUNTER — Encounter (HOSPITAL_BASED_OUTPATIENT_CLINIC_OR_DEPARTMENT_OTHER): Payer: Self-pay | Admitting: Orthopaedic Surgery

## 2023-07-05 NOTE — Progress Notes (Signed)
[name] - 53 y.o. female MRN [id number]  Date of birth: [birth year]  Office Visit Note: Visit Date: 06/27/2023 PCP: Sherlene Shams, MD Referred by: Huel Cote, MD  Subjective: Chief Complaint  Patient presents with   Right Hand - Pain   HPI:  [name] is a 53 y.o. female who comes in today at the request of Dr. Huel Cote for evaluation and management of chronic, worsening and severe pain, numbness and tingling in the Right upper extremities.  Patient is Right hand dominant.  Chronic long-term history of right hand pain with generalized paresthesias.  She underwent electrodiagnostic study in our office in 2019 which was essentially normal.  She does have signs and symptoms seemingly consistent with maybe a carpal tunnel syndrome but also possibly underlying pain sensitization type syndrome.  No formal diagnosis of fibromyalgia.  She does have multiple drug intolerances and history of lumbar fusion and low back pain.  History of pain in multiple joint areas.  Her case is complicated by type 2 diabetes with more controlled hemoglobin A1c recently.  She reports significant discomfort with prior electrodiagnostic study.  She does not have any frank radicular symptoms although she does have some neck pain.  She has noted some generalized weakness of the right hand.   I spent more than 30 minutes speaking face-to-face with the patient with 50% of the time in counseling and discussing coordination of care.    Review of Systems  Musculoskeletal:  Positive for back pain, joint pain and neck pain.  Neurological:  Positive for tingling and weakness.  All other systems reviewed and are negative.  Otherwise per HPI.  Assessment & Plan: Visit Diagnoses:    ICD-10-CM   1. Paresthesia of skin  R20.2 NCV with EMG (electromyography)    2. Impingement syndrome of right shoulder  M75.41     3. Pain in right wrist  M25.531     4. Weakness  R53.1       Plan:  Impression: Clinically she has some complaints likely consistent maybe with an underlying carpal tunnel syndrome but also likely underlying pain sensitization syndrome versus osteoarthritic changes.  Electrodiagnostic study performed today.  The above electrodiagnostic study is ABNORMAL and reveals evidence of a mild right median nerve entrapment at the wrist (carpal tunnel syndrome) affecting sensory components.  This represents a slight worsening since the last electrodiagnostic study.  **This electrodiagnostic study cannot rule out small fiber polyneuropathy and dysesthesias from central pain syndromes such as stroke or central pain sensitization syndromes such as fibromyalgia.  Myotomal referral pain from trigger points is also not excluded.   Recommendations: 1.  Follow-up with referring physician. 2.  Continue current management of symptoms.   Meds & Orders: No orders of the defined types were placed in this encounter.   Orders Placed This Encounter  Procedures   NCV with EMG (electromyography)    Follow-up: Return for Huel Cote, MD.   Procedures: No procedures performed  EMG & NCV Findings: Evaluation of the right median motor nerve showed reduced amplitude (3.4 mV).  The right median (across palm) sensory nerve showed prolonged distal peak latency (Wrist, 4.1 ms) and prolonged distal peak latency (Palm, 2.3 ms).  All remaining nerves (as indicated in the following tables) were within normal limits.    Impression: The above electrodiagnostic study is ABNORMAL and reveals evidence of a mild right median nerve entrapment at the wrist (carpal tunnel syndrome) affecting sensory components.  This represents a slight  worsening since the last electrodiagnostic study.  **This electrodiagnostic study cannot rule out small fiber polyneuropathy and dysesthesias from central pain syndromes such as stroke or central pain sensitization syndromes such as fibromyalgia.  Myotomal referral pain from  trigger points is also not excluded.   Recommendations: 1.  Follow-up with referring physician. 2.  Continue current management of symptoms.  ___________________________ Naaman Plummer FAAPMR Board Certified, American Board of Physical Medicine and Rehabilitation    Nerve Conduction Studies Anti Sensory Summary Table   Stim Site NR Peak (ms) Norm Peak (ms) P-T Amp (V) Norm P-T Amp Site1 Site2 Delta-P (ms) Dist (cm) Vel (m/s) Norm Vel (m/s)  Right Median Acr Palm Anti Sensory (2nd Digit)  29.9C  Wrist    *4.1 <3.6 24.0 >10 Wrist Palm 1.8 0.0    Palm    *2.3 <2.0 28.7         Right Radial Anti Sensory (Base 1st Digit)  29.9C  Wrist    2.3 <3.1 37.8  Wrist Base 1st Digit 2.3 0.0    Right Ulnar Anti Sensory (5th Digit)  30.3C  Wrist    3.4 <3.7 23.3 >15.0 Wrist 5th Digit 3.4 14.0 41 >38   Motor Summary Table   Stim Site NR Onset (ms) Norm Onset (ms) O-P Amp (mV) Norm O-P Amp Site1 Site2 Delta-0 (ms) Dist (cm) Vel (m/s) Norm Vel (m/s)  Right Median Motor (Abd Poll Brev)  30.4C  Wrist    3.9 <4.2 *3.4 >5 Elbow Wrist 4.2 21.5 51 >50  Elbow    8.1  4.1         Right Ulnar Motor (Abd Dig Min)  30.5C  Wrist    3.4 <4.2 8.9 >3 B Elbow Wrist 3.4 21.5 63 >53  B Elbow    6.8  8.4  A Elbow B Elbow 1.7 11.0 65 >53  A Elbow    8.5  8.3           Nerve Conduction Studies Anti Sensory Left/Right Comparison   Stim Site L Lat (ms) R Lat (ms) L-R Lat (ms) L Amp (V) R Amp (V) L-R Amp (%) Site1 Site2 L Vel (m/s) R Vel (m/s) L-R Vel (m/s)  Median Acr Palm Anti Sensory (2nd Digit)  29.9C  Wrist  *4.1   24.0  Wrist Palm     Palm  *2.3   28.7        Radial Anti Sensory (Base 1st Digit)  29.9C  Wrist  2.3   37.8  Wrist Base 1st Digit     Ulnar Anti Sensory (5th Digit)  30.3C  Wrist  3.4   23.3  Wrist 5th Digit  41    Motor Left/Right Comparison   Stim Site L Lat (ms) R Lat (ms) L-R Lat (ms) L Amp (mV) R Amp (mV) L-R Amp (%) Site1 Site2 L Vel (m/s) R Vel (m/s) L-R Vel (m/s)  Median Motor  (Abd Poll Brev)  30.4C  Wrist  3.9   *3.4  Elbow Wrist  51   Elbow  8.1   4.1        Ulnar Motor (Abd Dig Min)  30.5C  Wrist  3.4   8.9  B Elbow Wrist  63   B Elbow  6.8   8.4  A Elbow B Elbow  65   A Elbow  8.5   8.3           Waveforms:  Clinical History: 03/23/2018  Impression: Essentially NORMAL electrodiagnostic study of the right upper limb.  The reduced amplitude on the distal motor median nerve study is likely technical artifact as the amplitude was normal when stimulated at the elbow.   There is no significant electrodiagnostic evidence of nerve entrapment, brachial plexopathy or cervical radiculopathy.     **As you know, purely sensory or demyelinating radiculopathies and chemical radiculitis may not be detected with this particular electrodiagnostic study.   Recommendations: 1.  Follow-up with referring physician. 2.  Continue current management of symptoms.   ___________________________ Elease Hashimoto Board Certified, American Board of Physical Medicine and Rehabilitation     Objective:  VS:  HT:    WT:   BMI:     BP:   HR: bpm  TEMP: ( )  RESP:  Physical Exam Vitals and nursing note reviewed.  Constitutional:      General: She is not in acute distress.    Appearance: Normal appearance. She is well-developed. She is not ill-appearing.  HENT:     Head: Normocephalic and atraumatic.  Eyes:     Conjunctiva/sclera: Conjunctivae normal.     Pupils: Pupils are equal, round, and reactive to light.  Cardiovascular:     Rate and Rhythm: Normal rate.     Pulses: Normal pulses.  Pulmonary:     Effort: Pulmonary effort is normal.  Musculoskeletal:        General: No swelling, tenderness or deformity.     Right lower leg: No edema.     Left lower leg: No edema.     Comments: Inspection reveals no atrophy of the bilateral APB or FDI or hand intrinsics. There is no swelling, color changes, allodynia or dystrophic changes. There is 5 out of 5  strength in the bilateral wrist extension, finger abduction and long finger flexion. There is intact sensation to light touch in all dermatomal and peripheral nerve distributions. There is a negative Froment's test bilaterally. There is a negative Tinel's test at the bilateral wrist and elbow. There is a negative Phalen's test bilaterally. There is a negative Hoffmann's test bilaterally.  Skin:    General: Skin is warm and dry.     Findings: No erythema or rash.  Neurological:     General: No focal deficit present.     Mental Status: She is alert and oriented to person, place, and time.     Sensory: No sensory deficit.     Motor: No weakness or abnormal muscle tone.     Coordination: Coordination normal.     Gait: Gait normal.  Psychiatric:        Mood and Affect: Mood normal.        Behavior: Behavior normal.      Imaging: No results found.

## 2023-07-12 ENCOUNTER — Other Ambulatory Visit (HOSPITAL_COMMUNITY): Payer: Self-pay

## 2023-07-12 ENCOUNTER — Other Ambulatory Visit: Payer: Self-pay | Admitting: Internal Medicine

## 2023-07-12 MED ORDER — LOSARTAN POTASSIUM-HCTZ 50-12.5 MG PO TABS
1.0000 | ORAL_TABLET | Freq: Every day | ORAL | 0 refills | Status: DC
  Filled 2023-07-12: qty 90, 90d supply, fill #0

## 2023-07-13 ENCOUNTER — Other Ambulatory Visit: Payer: Self-pay

## 2023-07-13 ENCOUNTER — Other Ambulatory Visit (HOSPITAL_COMMUNITY): Payer: Self-pay

## 2023-07-13 MED ORDER — OZEMPIC (1 MG/DOSE) 4 MG/3ML ~~LOC~~ SOPN
1.0000 mg | PEN_INJECTOR | SUBCUTANEOUS | 1 refills | Status: DC
  Filled 2023-07-13 – 2023-08-16 (×2): qty 9, 84d supply, fill #0

## 2023-07-15 ENCOUNTER — Other Ambulatory Visit: Payer: Self-pay

## 2023-07-15 ENCOUNTER — Other Ambulatory Visit (HOSPITAL_COMMUNITY): Payer: Self-pay

## 2023-07-19 ENCOUNTER — Ambulatory Visit (INDEPENDENT_AMBULATORY_CARE_PROVIDER_SITE_OTHER): Payer: Commercial Managed Care - PPO

## 2023-07-19 ENCOUNTER — Encounter (HOSPITAL_BASED_OUTPATIENT_CLINIC_OR_DEPARTMENT_OTHER): Payer: Self-pay | Admitting: Physical Therapy

## 2023-07-19 ENCOUNTER — Ambulatory Visit (HOSPITAL_BASED_OUTPATIENT_CLINIC_OR_DEPARTMENT_OTHER): Payer: Commercial Managed Care - PPO | Attending: Orthopaedic Surgery | Admitting: Physical Therapy

## 2023-07-19 ENCOUNTER — Ambulatory Visit (INDEPENDENT_AMBULATORY_CARE_PROVIDER_SITE_OTHER): Payer: Commercial Managed Care - PPO | Admitting: Orthopedic Surgery

## 2023-07-19 DIAGNOSIS — M542 Cervicalgia: Secondary | ICD-10-CM | POA: Insufficient documentation

## 2023-07-19 DIAGNOSIS — M25611 Stiffness of right shoulder, not elsewhere classified: Secondary | ICD-10-CM | POA: Diagnosis not present

## 2023-07-19 DIAGNOSIS — S46011A Strain of muscle(s) and tendon(s) of the rotator cuff of right shoulder, initial encounter: Secondary | ICD-10-CM | POA: Insufficient documentation

## 2023-07-19 DIAGNOSIS — G8929 Other chronic pain: Secondary | ICD-10-CM | POA: Diagnosis not present

## 2023-07-19 DIAGNOSIS — M6281 Muscle weakness (generalized): Secondary | ICD-10-CM | POA: Insufficient documentation

## 2023-07-19 DIAGNOSIS — R200 Anesthesia of skin: Secondary | ICD-10-CM

## 2023-07-19 DIAGNOSIS — M25511 Pain in right shoulder: Secondary | ICD-10-CM | POA: Diagnosis not present

## 2023-07-19 DIAGNOSIS — Z9889 Other specified postprocedural states: Secondary | ICD-10-CM | POA: Diagnosis not present

## 2023-07-19 NOTE — Therapy (Signed)
OUTPATIENT PHYSICAL THERAPY SHOULDER RE-EVALUATION   Patient Name: [name] MRN: [id number] DOB:[birth year], 53 y.o., female Today's Date: 07/19/2023  END OF SESSION:  PT End of Session - 07/19/23 2110     Visit Number 9    Number of Visits 24    Date for PT Re-Evaluation 10/17/23    Authorization Type Lanetta Inch 2024    Authorization Time Period 25 PT/OT    Progress Note Due on Visit 19    PT Start Time 1615    PT Stop Time 1708    PT Time Calculation (min) 53 min    Activity Tolerance Patient tolerated treatment well    Behavior During Therapy [facility] for tasks assessed/performed                 Past Medical History:  Diagnosis Date   Allergy    Anemia    Anxiety    claustrophobic   Asthma    Back pain    Biceps tendonosis of right shoulder    Cataract    Mixed OU   COVID-19    covid PNA hospitalized 2020, 06/06/21   Diabetes mellitus 2011   did not start metforfin, losing weight   Difficult intubation    tooth got chipped one time   Dyspnea    Essential hypertension    Fatty liver    Food allergy    Frozen shoulder    left, had steroid injection 10/05/21   Headache disorder    History of concussion    HLD (hyperlipidemia)    Hypertensive retinopathy    OU   IBS (irritable bowel syndrome)    Infertility, female    Joint pain    Lactose intolerance    Leg edema    Migraines    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   Palpitation    Post-menopausal    Seborrheic dermatitis    Shoulder impingement syndrome, right    Stage 3a chronic kidney disease (CKD) (HCC)    Type 2 diabetes mellitus with complication, with long-term current use of insulin (HCC) 2018   Uveitis    [place] [facility] est in 2020 seen specialist in [place] [facility] last seen 11 or 05/2020   Vitamin D deficiency    Past Surgical History:  Procedure Laterality Date   ABDOMINAL HYSTERECTOMY  2006   heavy menses, endometriosis, l oophrectomy   BREAST BIOPSY Right 2018    benign   BREAST EXCISIONAL BIOPSY Right 2003   benign   BREAST EXCISIONAL BIOPSY Right 1999   benign   COLONOSCOPY  10/06/2020   INGUINAL HERNIA REPAIR Left 2003   LEFT OOPHORECTOMY  2006   LUMBAR FUSION  2005   L5-S1   SHOULDER ARTHROSCOPY WITH ROTATOR CUFF REPAIR AND SUBACROMIAL DECOMPRESSION  04/06/2023   Procedure: SHOULDER ARTHROSCOPY WITH ROTATOR CUFF REPAIR , EXTENSIVE DEBRIDEMENT, AND SUBACROMIAL DECOMPRESSION;  Surgeon: Huel Cote, MD;  Location: [facility];  Service: Orthopedics;;   SHOULDER ARTHROSCOPY WITH SUBACROMIAL DECOMPRESSION AND OPEN ROTATOR C Right 07/14/2016   Procedure: RIGHT SHOULDER ARTHROSCOPY WITH SUBACROMIAL DECOMPRESSION, DISTAL CLAVICLE RESECTION AND MINI OPEN ROTATOR CUFF REPAIR, OPEN BICEP TENDODESIS;  Surgeon: Valeria Batman, MD;  Location: [facility];  Service: Orthopedics;  Laterality: Right;   SHOULDER CLOSED REDUCTION Right 09/08/2016   Procedure: RIGHT CLOSED MANIPULATION SHOULDER;  Surgeon: Valeria Batman, MD;  Location: [facility];  Service: Orthopedics;  Laterality: Right;   TUBAL LIGATION  UPPER GASTROINTESTINAL ENDOSCOPY  10/06/2020   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 09/21/2021   Chronic left shoulder pain 09/21/2021   Fatty infiltration of liver 07/26/2020   Diarrhea, functional 06/22/2020   Uveitis, intermediate, bilateral 09/07/2019   Generalized edema 06/13/2019   Hospital discharge follow-up 06/07/2019   Multinodular goiter 12/05/2017   Microalbuminuria due to type 2 diabetes mellitus (HCC) 11/26/2017   Encounter for preventive health examination 11/26/2017   Menopause syndrome 06/10/2017   Adverse reaction to vaccine, sequela 03/25/2017   Atypical chest pain 02/27/2017   PSVT (paroxysmal supraventricular tachycardia) (HCC)  11/26/2016   Nontraumatic incomplete tear of right rotator cuff 07/14/2016   AC (acromioclavicular) arthritis 07/14/2016   Impingement syndrome of right shoulder 07/14/2016   Fibrocystic breast changes, right 05/21/2016   Insomnia 10/16/2015   Snoring 07/26/2015   Chronic migraine w/o aura w/o status migrainosus, not intractable 02/24/2015   History of concussion 07/21/2014   Hyperlipidemia associated with type 2 diabetes mellitus (HCC) 06/13/2014   Vitamin D deficiency 05/07/2013   Chronic migraine 11/20/2012   S/P Total Abdominal Hysterectomy and Left Salpingo-oophorectomy 10/20/2011   Headache around the eyes    Essential hypertension 07/20/2011    PCP: Dr. Duncan Dull   REFERRING PROVIDER: Dr. Huel Cote   REFERRING DIAG: Right Rotator Cuff Repair   THERAPY DIAG:  Acute pain of right shoulder  Cervicalgia  Muscle weakness (generalized)  Rationale for Evaluation and Treatment: Rehabilitation  ONSET DATE: 04/06/23  SUBJECTIVE:                                                                                                                                                                                      SUBJECTIVE STATEMENT:  Pt is here for continuation of PT for the shoulder post op but has severe R UE nerve pain. Pt notes that NCV study in the chart is inaccurate. Done in January of 2025 by Dr. Alvester Morin. Pt did get an OT referral for the cubital tunnel and carpal tunnel. Pt does WFH at a laptop for occupation. Last 3 digits will have NT. Pt does note dropping items and hand weakness. Turning the neck will cause R UE pain. Pt must turn trunk in order to look over shoulder when driving. Pt notes that NT the Sunday after surgery. Pt has a history of some NT prior and had NCV in 2019 as well.     PERTINENT HISTORY:  Previous RC repair on R side  PAIN:  Are you  having pain? Yes: NPRS scale: 3/10 Pain location: ant R shoulder and radiates down into last 3 digits Pain  description: Achy and sharp, tingling,shooting  Aggravating factors: Driving Relieving factors: Gabapentin   PRECAUTIONS: RC repair  RED FLAGS: None   WEIGHT BEARING RESTRICTIONS: No  FALLS:  Has patient fallen in last 6 months? No  LIVING ENVIRONMENT:  Lives with family- college aged daughter Stairs No AD usage    OCCUPATION: Arts development officer   PLOF: Independent  PATIENT GOALS: Return to using right shoulder for functional activities like lifting objects and reaching overhead    OBJECTIVE:  Note: Objective measures were completed at Evaluation unless otherwise noted.   DIAGNOSTIC FINDINGS:  Discs: Disc spaces are maintained.   C2-3: No significant disc bulge. No neural foraminal stenosis. No central canal stenosis.   C3-4: No disc protrusion. Mild bilateral foraminal stenosis. No central canal stenosis.   C4-5: No disc protrusion. No right foraminal stenosis. Mild left foraminal stenosis. No central canal stenosis.   C5-6: Small central disc protrusion. No foraminal or central canal stenosis.   C6-7: Minimal disc bulge. Mild bilateral foraminal narrowing. No central canal stenosis.   C7-T1: No disc protrusion. Mild bilateral foraminal stenosis. No central canal stenosis.   IMPRESSION: 1. Mild cervical spine spondylosis as described above. 2. No acute osseous injury of the cervical spine.    PATIENT SURVEYS:  FOTO 40 with target of 60   COGNITION: Overall cognitive status: Within functional limits for tasks assessed     SENSATION: Light touch: Impaired  numbness and tingling down shoulder into right palm   POSTURE: Rounded shoulders; R UE guarding position with elbow flexion  CERVICAL ROM: painful into R side of neck and   Active ROM A/PROM (deg) eval  Flexion 50% R deviation  Extension 20% R deviation  Right lateral flexion 0%  Left lateral flexion 0%  Right rotation 0%  Left rotation 0%   (Blank rows = not tested)   UPPER  EXTREMITY ROM:   Active/ ROM Right eval  Shoulder flexion 80  Shoulder extension   Shoulder abduction 90  Shoulder adduction   Shoulder internal rotation To belly  Shoulder external rotation 30  Elbow flexion 125  Elbow extension -20  Wrist flexion 20  Wrist extension 50  Wrist ulnar deviation   Wrist radial deviation   Wrist pronation   Wrist supination   (Blank rows = not tested)   UPPER EXTREMITY MMT:  MMT Right eval  Shoulder flexion 3-  Shoulder extension   Shoulder abduction 3-   Shoulder adduction   Shoulder internal rotation 4  Shoulder external rotation 3-  Middle trapezius   Lower trapezius   Elbow flexion   Elbow extension   Wrist flexion   Wrist extension   Wrist ulnar deviation   Wrist radial deviation   Wrist pronation   Wrist supination   Grip strength (lbs) For next visit  (Blank rows = not tested)  Special Tests:  Reflexes  1+ C7 2+ C6   (+) Distraction (-) Spurling (painful regardless of over pressure or position) (-) ER Lag  C4-T1 myotomal weakness on R    PALPATION:  Hypersensitivity and TTP of R UT, levator, pec minor, biceps, deltoid, supra   TODAY'S TREATMENT:  DATE:   2/12  Exercises - Standing Scapular Retraction  - 3-4 x daily - 7 x weekly - 1 sets - 10 reps - Seated Cervical Retraction  - 3-4 x daily - 7 x weekly - 1 sets - 10 reps - Doorway Pec Stretch at 60 Degrees Abduction with Arm Straight  - 3-4 x daily - 7 x weekly - 1 sets - 3 reps - 10 hold - Median Nerve Glide  - 3-4 x daily - 7 x weekly - 2 sets - 5 reps    PATIENT EDUCATION: Education details: MOI, diagnosis, prognosis, anatomy, exercise progression, DOMS expectations, acceptable pain,   envelope of function, HEP, POC Person educated: Patient Education method: Explanation, Demonstration, Verbal cues, and  Handouts Education comprehension: verbalized understanding, returned demonstration, and verbal cues required  HOME EXERCISE PROGRAM: Access Code: [email or web address] URL: [email or web address]  ASSESSMENT:  CLINICAL IMPRESSION: Patient is a 53 y.o. female who was seen today for physical therapy evaluation and treatment s/p RC repair on 10/31. Therapy paused previously due to R sided radicular pain. Pt's s/s appear consistent with potential R cervical radiculopathy. A component of TOS is also on differential diagnosis list. Pt does have fatiguing weakness through C4-T1 myotomes as well as history of cubital and carpal tunnel syndromes. Pt's cervical ROM is highly limited despite normal appear MRI report. Pt's pain is extremely sensitive and irritable with movement. Pt's R UE and neck muscle spasm and guarding are limiting factors to her R shoulder rotator cuff rehab program. Plan to prioritize reduction of R upper quarter pain and muscle spasm at future sessions and introduce R shoulder strengthening and ROM as tolerated. Pt also to have OT consult at a future visit. Pt would benefit from continued skilled therapy in order to reach goals and maximize functional R UE strength and ROM for full return to PLOF. [name]     OBJECTIVE IMPAIRMENTS: decreased ROM, decreased strength, impaired sensation, impaired UE functional use, and pain.   ACTIVITY LIMITATIONS: carrying, lifting, reach over head, and caring for others  PARTICIPATION LIMITATIONS: meal prep, cleaning, laundry, driving, shopping, community activity, and occupation  PERSONAL FACTORS: Fitness, Past/current experiences, Time since onset of injury/illness/exacerbation, and 1-2 comorbidities:    are also affecting patient's functional outcome.   REHAB POTENTIAL: Fair  CLINICAL DECISION MAKING: unstable/complicated  EVALUATION COMPLEXITY: moderate   GOALS: Goals reviewed with patient? No  SHORT TERM GOALS: Target date: 04/25/2023  PT  reviewed the following HEP with patient with patient able to demonstrate a set of the following with min cuing for correction needed. PT educated patient on parameters of therex (how/when to inc/decrease intensity, frequency, rep/set range, stretch hold time, and purpose of therex) with verbalized understanding.  Baseline: NT 04/17/23: Performing independently  Goal status: ONGOING   2.  Patient will demonstrate right shoulder PROM flexion to >=100 degrees and abduction to >=90 degrees as evidence of tissue healing.  Baseline: Shoulder Flex R/L NT/180, Shoulder Abd R/L NT/180  Goal status: ONGOING      LONG TERM GOALS: Target date: 10/17/2023   Patient will have improved function and activity level as evidenced by an increase in FOTO score by 10 points or more.  Baseline: 40  Goal status: ONGOING   2.  Patient will improve right shoulder AROM to be symmetrical with left shoulder AROM for improved RUE function to perform reaching tasks like grabbing objects off a shelf.  Baseline: Shoulder Flex R/L NT/180, Shoulder Abd R/L NT/180  Goal status: ONGOING  3.  Patient will improve right shoulder strength to  Baseline:  Shoulder Flex R/L NT/5, Shoulder Abd R/L NT/5, Shoulder ER R/L NT/NT, Shoulder IR R/L NT/NT  Goal status: ONGOING    PLAN:  PT FREQUENCY: 1-2x/week  PT DURATION: 12 wks  PLANNED INTERVENTIONS: 97164- PT Re-evaluation, 97110-Therapeutic exercises, 97530- Therapeutic activity, 97112- Neuromuscular re-education, 97535- Self Care, 16109- Manual therapy, 97014- Electrical stimulation (unattended), [id number]- Electrical stimulation (manual), Patient/Family education, Taping, Dry Needling, Joint mobilization, Joint manipulation, Spinal manipulation, Spinal mobilization, Scar mobilization, DME instructions, Cryotherapy, and Moist heat  PLAN FOR NEXT SESSION: cervical retraction with or without manual traction; review nerve gliding, R UE scapular depression, R scapular retraction,  cervical ROM focus, pain management in cervical and R upper quarter as pain is limiting RC progression  Zebedee Iba PT, DPT 07/19/23 9:29 PM

## 2023-07-19 NOTE — Progress Notes (Addendum)
[name] - 53 y.o. female MRN [id number]  Date of birth: Mar 15, 1971  Office Visit Note: Visit Date: 07/19/2023 PCP: Sherlene Shams, MD Referred by: Huel Cote, MD  Subjective: No chief complaint on file.  HPI: [name] is a pleasant 53 y.o. female who presents today for evaluation of numbness and tingling in the right hand, primarily the ulnar-sided digits.  She states that the symptoms occasionally radiating up the arm towards the elbow.  States that the symptoms are worse in a bent elbow position.  She has a history notable for a recent right shoulder surgery by Dr. Steward Drone in October of last year for the right rotator cuff and labrum.  She is having some ongoing stiffness of the right upper extremity and is currently still doing therapy for the right shoulder.  Has not undergone any prior treatments for the numbness and tingling.  She did undergo recent EMG study which showed mild carpal tunnel.  She was referred to me by Dr. Steward Drone for specific hand surgery evaluation of ongoing numbness in the right upper extremity.  Pertinent ROS were reviewed with the patient and found to be negative unless otherwise specified above in HPI.   Visit Reason: right hand numbness and tingling with radiation of symptoms up the arm, primarily ulnar-sided digits Duration of symptoms: 3+ months Hand dominance: right Occupation: Nurse Diabetic: Yes Smoking: No Heart/Lung History: yes Blood Thinners: baby aspirin  Prior Testing/EMG: EMG 06/27/23 Injections (Date): none Treatments: none Prior Surgery: shoulder surgery  Assessment & Plan: Visit Diagnoses:  1. Right arm numbness     Plan: Extensive discussion was had with the patient today regarding her ongoing right upper extremity complaints.  Clinically, she is demonstrating signs symptoms consistent more with cubital tunnel syndrome given the distribution of her numbness as well as the positive Tinel's over the cubital  tunnel region.  I explained that the bent elbow position can exacerbate the symptoms.  We reviewed the anatomy and pathophysiology of nerve compression both at the cubital tunnel and carpal tunnel regions.  We reviewed the results of her electrodiagnostic study which do show appropriate nerve conduction velocities, there is some concern for mild carpal tunnel syndrome based on her nerve study.  Clinically, as mentioned her symptoms are more on the ulnar aspect of her hand, however there is involvement of the long finger which could indicate some carpal tunnel syndrome as well.  For the time being, given that she is still healing from the right shoulder and is progressing with therapy, recommended that we add in occupational therapy for the right elbow and wrist for nerve gliding exercises for both cubital tunnel and carpal tunnel syndrome.  Referral was placed today to occupational therapy, I would like to see her back in approximate 3 months time to see how she is doing with the nerve gliding exercises, I did also emphasize trying to stay out of a bent elbow position particularly at night.  She expressed understanding.  Follow-up: No follow-ups on file.   Meds & Orders: No orders of the defined types were placed in this encounter.   Orders Placed This Encounter  Procedures   XR Wrist Complete Right   XR Elbow 2 Views Right   Ambulatory referral to Occupational Therapy     Procedures: No procedures performed      Clinical History: 03/23/2018  Impression: Essentially NORMAL electrodiagnostic study of the right upper limb.  The reduced amplitude on the distal motor median nerve  study is likely technical artifact as the amplitude was normal when stimulated at the elbow.   There is no significant electrodiagnostic evidence of nerve entrapment, brachial plexopathy or cervical radiculopathy.     **As you know, purely sensory or demyelinating radiculopathies and chemical radiculitis may not be  detected with this particular electrodiagnostic study.   Recommendations: 1.  Follow-up with referring physician. 2.  Continue current management of symptoms.   ___________________________ Elease Hashimoto Board Certified, American Board of Physical Medicine and Rehabilitation  She reports that she has never smoked. She has never used smokeless tobacco.  Recent Labs    01/17/23 1643  HGBA1C 5.9*    Objective:   Vital Signs: There were no vitals taken for this visit.  Physical Exam  Gen: Well-appearing, in no acute distress; non-toxic CV: Regular Rate. Well-perfused. Warm.  Resp: Breathing unlabored on room air; no wheezing. Psych: Fluid speech in conversation; appropriate affect; normal thought process  Ortho Exam PHYSICAL EXAM:  General: Patient is well appearing and in no distress.  Range of Motion and Palpation Tests: Forearm supination and pronation are 65/65 bilaterally.  Wrist flexion/extension is 75/65 bilaterally.  Digital flexion and extension are full.  Thumb opposition is full to the base of the small fingers bilaterally.    No cords or nodules are palpated.  No triggering is observed.     Neurologic, Vascular, Motor: Right upper extremity Sensation is diminished to light touch in the right long, ring and small digits.    Thenar atrophy: Negative Tinel sign: Positive cubital tunnel, mildly positive carpal tunnel Positive scratch collapse right elbow cubital tunnel Carpal tunnel compression: Minimally positive Phalen test: Minimally positive   Motor right hand FPL: 5/5 Index FDP: 5/5 APB: 5/5 Mildly positive Froment's right hand, no evidence of FDI wasting, weak interosseous  Fingers pink and well perfused.  Capillary refill is brisk.     Lab Results  Component Value Date   HGBA1C 5.9 (A) 01/17/2023     Imaging: XR Wrist Complete Right Result Date: 07/19/2023 X-rays of the right wrist demonstrate mild ulnar positivity without clear evidence  of impaction.  Well located radiocarpal and midcarpal articulations without significant abnormalities.  XR Elbow 2 Views Right Result Date: 07/19/2023 X-rays of the right elbow, multiple views were obtained today X-rays demonstrate stable appearance of the radiocapitellar and ulnohumeral articulations without significant abnormalities.  Bone mineralization is appropriate.   Past Medical/Family/Surgical/Social History: Medications & Allergies reviewed per EMR, new medications updated. 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 09/21/2021   Chronic left shoulder pain 09/21/2021   Fatty infiltration of liver 07/26/2020   Diarrhea, functional 06/22/2020   Uveitis, intermediate, bilateral 09/07/2019   Generalized edema 06/13/2019   Hospital discharge follow-up 06/07/2019   Multinodular goiter 12/05/2017   Microalbuminuria due to type 2 diabetes mellitus (HCC) 11/26/2017   Encounter for preventive health examination 11/26/2017   Menopause syndrome 06/10/2017   Adverse reaction to vaccine, sequela 03/25/2017   Atypical chest pain 02/27/2017   PSVT (paroxysmal supraventricular tachycardia) (HCC) 11/26/2016   Nontraumatic incomplete tear of right rotator cuff 07/14/2016   AC (acromioclavicular) arthritis 07/14/2016   Impingement syndrome of right shoulder 07/14/2016   Fibrocystic breast changes, right 05/21/2016   Insomnia 10/16/2015   Snoring 07/26/2015   Chronic migraine w/o aura w/o status migrainosus, not intractable 02/24/2015  History of concussion 07/21/2014   Hyperlipidemia associated with type 2 diabetes mellitus (HCC) 06/13/2014   Vitamin D deficiency 05/07/2013   Chronic migraine 11/20/2012   S/P Total Abdominal Hysterectomy and Left Salpingo-oophorectomy 10/20/2011   Headache around the eyes    Essential hypertension  07/20/2011   Past Medical History:  Diagnosis Date   Allergy    Anemia    Anxiety    claustrophobic   Asthma    Back pain    Biceps tendonosis of right shoulder    Cataract    Mixed OU   COVID-19    covid PNA hospitalized 2020, 06/06/21   Diabetes mellitus 2011   did not start metforfin, losing weight   Difficult intubation    tooth got chipped one time   Dyspnea    Essential hypertension    Fatty liver    Food allergy    Frozen shoulder    left, had steroid injection 10/05/21   Headache disorder    History of concussion    HLD (hyperlipidemia)    Hypertensive retinopathy    OU   IBS (irritable bowel syndrome)    Infertility, female    Joint pain    Lactose intolerance    Leg edema    Migraines    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   Palpitation    Post-menopausal    Seborrheic dermatitis    Shoulder impingement syndrome, right    Stage 3a chronic kidney disease (CKD) (HCC)    Type 2 diabetes mellitus with complication, with long-term current use of insulin (HCC) 2018   Uveitis    [facility] est in 2020 seen specialist in [place] [facility] last seen 11 or 05/2020   Vitamin D deficiency    Family History  Problem Relation Age of Onset   Diabetes Mother    Heart disease Mother 72   Hypertension Mother    Hyperlipidemia Mother    Heart failure Mother    Stroke Mother    Thyroid disease Mother    Depression Mother    Sleep apnea Mother    Obesity Mother    Colon polyps Mother    Cancer Father 61       Lung Cancer   Heart disease Father    Mental illness Sister        bipolar, substance abuse,  clean 2 yrs   Hypertension Sister    Cancer Paternal Grandmother 74       breast cancer   Breast cancer Paternal Grandmother    Diabetes Maternal Grandmother    Hypertension Maternal Grandmother    Diabetes Son    Colon cancer Neg Hx    Esophageal cancer Neg Hx    Rectal cancer Neg Hx    Stomach cancer Neg Hx    Past Surgical History:   Procedure Laterality Date   ABDOMINAL HYSTERECTOMY  2006   heavy menses, endometriosis, l oophrectomy   BREAST BIOPSY Right 2018   benign   BREAST EXCISIONAL BIOPSY Right 2003   benign   BREAST EXCISIONAL BIOPSY Right 1999   benign   COLONOSCOPY  10/06/2020   INGUINAL HERNIA REPAIR Left 2003   LEFT OOPHORECTOMY  2006   LUMBAR FUSION  2005   L5-S1   SHOULDER ARTHROSCOPY WITH ROTATOR CUFF REPAIR AND SUBACROMIAL DECOMPRESSION  04/06/2023   Procedure: SHOULDER ARTHROSCOPY WITH ROTATOR CUFF REPAIR , EXTENSIVE DEBRIDEMENT, AND SUBACROMIAL DECOMPRESSION;  Surgeon: Huel Cote, MD;  Location: [facility];  Service:  Orthopedics;;   SHOULDER ARTHROSCOPY WITH SUBACROMIAL DECOMPRESSION AND OPEN ROTATOR C Right 07/14/2016   Procedure: RIGHT SHOULDER ARTHROSCOPY WITH SUBACROMIAL DECOMPRESSION, DISTAL CLAVICLE RESECTION AND MINI OPEN ROTATOR CUFF REPAIR, OPEN BICEP TENDODESIS;  Surgeon: Valeria Batman, MD;  Location: [facility];  Service: Orthopedics;  Laterality: Right;   SHOULDER CLOSED REDUCTION Right 09/08/2016   Procedure: RIGHT CLOSED MANIPULATION SHOULDER;  Surgeon: Valeria Batman, MD;  Location: [facility];  Service: Orthopedics;  Laterality: Right;   TUBAL LIGATION     UPPER GASTROINTESTINAL ENDOSCOPY  10/06/2020   Social History   Occupational History   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 [facility] 10:18 AM

## 2023-07-20 ENCOUNTER — Encounter (HOSPITAL_BASED_OUTPATIENT_CLINIC_OR_DEPARTMENT_OTHER): Payer: Self-pay | Admitting: Physical Therapy

## 2023-07-20 ENCOUNTER — Other Ambulatory Visit (HOSPITAL_BASED_OUTPATIENT_CLINIC_OR_DEPARTMENT_OTHER): Payer: Self-pay | Admitting: Orthopaedic Surgery

## 2023-07-20 ENCOUNTER — Encounter (HOSPITAL_BASED_OUTPATIENT_CLINIC_OR_DEPARTMENT_OTHER): Payer: Self-pay | Admitting: Orthopaedic Surgery

## 2023-07-20 ENCOUNTER — Other Ambulatory Visit (HOSPITAL_COMMUNITY): Payer: Self-pay

## 2023-07-20 MED ORDER — PREGABALIN 75 MG PO CAPS
75.0000 mg | ORAL_CAPSULE | Freq: Two times a day (BID) | ORAL | 0 refills | Status: DC
  Filled 2023-07-20: qty 60, 30d supply, fill #0

## 2023-07-25 ENCOUNTER — Encounter: Payer: Self-pay | Admitting: Radiology

## 2023-07-25 ENCOUNTER — Encounter (HOSPITAL_BASED_OUTPATIENT_CLINIC_OR_DEPARTMENT_OTHER): Payer: Self-pay

## 2023-07-25 ENCOUNTER — Ambulatory Visit (HOSPITAL_BASED_OUTPATIENT_CLINIC_OR_DEPARTMENT_OTHER): Payer: Commercial Managed Care - PPO

## 2023-07-25 DIAGNOSIS — Z9889 Other specified postprocedural states: Secondary | ICD-10-CM | POA: Diagnosis not present

## 2023-07-25 DIAGNOSIS — M542 Cervicalgia: Secondary | ICD-10-CM | POA: Diagnosis not present

## 2023-07-25 DIAGNOSIS — S46011A Strain of muscle(s) and tendon(s) of the rotator cuff of right shoulder, initial encounter: Secondary | ICD-10-CM | POA: Diagnosis not present

## 2023-07-25 DIAGNOSIS — M25511 Pain in right shoulder: Secondary | ICD-10-CM | POA: Diagnosis not present

## 2023-07-25 DIAGNOSIS — M6281 Muscle weakness (generalized): Secondary | ICD-10-CM

## 2023-07-25 DIAGNOSIS — M25611 Stiffness of right shoulder, not elsewhere classified: Secondary | ICD-10-CM | POA: Diagnosis not present

## 2023-07-25 DIAGNOSIS — G8929 Other chronic pain: Secondary | ICD-10-CM | POA: Diagnosis not present

## 2023-07-25 NOTE — Therapy (Signed)
OUTPATIENT PHYSICAL THERAPY SHOULDER TREATMENT   Patient Name: [name] MRN: [id number] DOB:Oct 12, 1970, 53 y.o., female Today's Date: 07/25/2023  END OF SESSION:  PT End of Session - 07/25/23 1113     Visit Number 10    Number of Visits 24    Date for PT Re-Evaluation 10/17/23    Authorization Type Lanetta Inch 2024    Authorization Time Period 25 PT/OT    Authorization - Number of Visits 20    Progress Note Due on Visit 19    PT Start Time 1105    PT Stop Time 1145    PT Time Calculation (min) 40 min    Activity Tolerance Patient limited by pain    Behavior During Therapy [facility] for tasks assessed/performed                  Past Medical History:  Diagnosis Date   Allergy    Anemia    Anxiety    claustrophobic   Asthma    Back pain    Biceps tendonosis of right shoulder    Cataract    Mixed OU   COVID-19    covid PNA hospitalized 2020, 06/06/21   Diabetes mellitus 2011   did not start metforfin, losing weight   Difficult intubation    tooth got chipped one time   Dyspnea    Essential hypertension    Fatty liver    Food allergy    Frozen shoulder    left, had steroid injection 10/05/21   Headache disorder    History of concussion    HLD (hyperlipidemia)    Hypertensive retinopathy    OU   IBS (irritable bowel syndrome)    Infertility, female    Joint pain    Lactose intolerance    Leg edema    Migraines    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   Palpitation    Post-menopausal    Seborrheic dermatitis    Shoulder impingement syndrome, right    Stage 3a chronic kidney disease (CKD) (HCC)    Type 2 diabetes mellitus with complication, with long-term current use of insulin (HCC) 2018   Uveitis    [place] [facility] est in 2020 seen specialist in [place] [facility] last seen 11 or 05/2020   Vitamin D deficiency    Past Surgical History:  Procedure Laterality Date   ABDOMINAL HYSTERECTOMY  2006   heavy menses, endometriosis, l  oophrectomy   BREAST BIOPSY Right 2018   benign   BREAST EXCISIONAL BIOPSY Right 2003   benign   BREAST EXCISIONAL BIOPSY Right 1999   benign   COLONOSCOPY  10/06/2020   INGUINAL HERNIA REPAIR Left 2003   LEFT OOPHORECTOMY  2006   LUMBAR FUSION  2005   L5-S1   SHOULDER ARTHROSCOPY WITH ROTATOR CUFF REPAIR AND SUBACROMIAL DECOMPRESSION  04/06/2023   Procedure: SHOULDER ARTHROSCOPY WITH ROTATOR CUFF REPAIR , EXTENSIVE DEBRIDEMENT, AND SUBACROMIAL DECOMPRESSION;  Surgeon: Huel Cote, MD;  Location: [facility];  Service: Orthopedics;;   SHOULDER ARTHROSCOPY WITH SUBACROMIAL DECOMPRESSION AND OPEN ROTATOR C Right 07/14/2016   Procedure: RIGHT SHOULDER ARTHROSCOPY WITH SUBACROMIAL DECOMPRESSION, DISTAL CLAVICLE RESECTION AND MINI OPEN ROTATOR CUFF REPAIR, OPEN BICEP TENDODESIS;  Surgeon: Valeria Batman, MD;  Location: [facility];  Service: Orthopedics;  Laterality: Right;   SHOULDER CLOSED REDUCTION Right 09/08/2016   Procedure: RIGHT CLOSED MANIPULATION SHOULDER;  Surgeon: Valeria Batman, MD;  Location: [facility];  Service: Orthopedics;  Laterality: Right;   TUBAL LIGATION     UPPER GASTROINTESTINAL ENDOSCOPY  10/06/2020   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 09/21/2021   Chronic left shoulder pain 09/21/2021   Fatty infiltration of liver 07/26/2020   Diarrhea, functional 06/22/2020   Uveitis, intermediate, bilateral 09/07/2019   Generalized edema 06/13/2019   Hospital discharge follow-up 06/07/2019   Multinodular goiter 12/05/2017   Microalbuminuria due to type 2 diabetes mellitus (HCC) 11/26/2017   Encounter for preventive health examination 11/26/2017   Menopause syndrome 06/10/2017   Adverse reaction to vaccine, sequela 03/25/2017   Atypical chest pain 02/27/2017   PSVT  (paroxysmal supraventricular tachycardia) (HCC) 11/26/2016   Nontraumatic incomplete tear of right rotator cuff 07/14/2016   AC (acromioclavicular) arthritis 07/14/2016   Impingement syndrome of right shoulder 07/14/2016   Fibrocystic breast changes, right 05/21/2016   Insomnia 10/16/2015   Snoring 07/26/2015   Chronic migraine w/o aura w/o status migrainosus, not intractable 02/24/2015   History of concussion 07/21/2014   Hyperlipidemia associated with type 2 diabetes mellitus (HCC) 06/13/2014   Vitamin D deficiency 05/07/2013   Chronic migraine 11/20/2012   S/P Total Abdominal Hysterectomy and Left Salpingo-oophorectomy 10/20/2011   Headache around the eyes    Essential hypertension 07/20/2011    PCP: Dr. Duncan Dull   REFERRING PROVIDER: Dr. Huel Cote   REFERRING DIAG: Right Rotator Cuff Repair   THERAPY DIAG:  Acute pain of right shoulder  Cervicalgia  Muscle weakness (generalized)  Rationale for Evaluation and Treatment: Rehabilitation  ONSET DATE: 04/06/23  SUBJECTIVE:                                                                                                                                                                                      SUBJECTIVE STATEMENT:    Pt reports ongoing nerve pain since surgery. Had increased pain and spasms after last PT visit. Messaged MD the day after and he prescribed her Lyrica. Unable to sleep in her usual position since surgery. Starts OT next week.   Eval: Pt is here for continuation of PT for the shoulder post op but has severe R UE nerve pain. Pt notes that NCV study in the chart is inaccurate. Done in January of 2025 by Dr. Alvester Morin. Pt did get an OT referral for the cubital tunnel and carpal tunnel. Pt does WFH at a laptop for occupation. Last 3 digits will have NT. Pt does note dropping items and hand weakness. Turning the neck will cause R UE  pain. Pt must turn trunk in order to look over shoulder when driving. Pt  notes that NT the Sunday after surgery. Pt has a history of some NT prior and had NCV in 2019 as well.     PERTINENT HISTORY:  Previous RC repair on R side  PAIN:  Are you having pain? Yes: NPRS scale: 3/10 Pain location: ant R shoulder and radiates down into last 3 digits Pain description: Achy and sharp, tingling,shooting  Aggravating factors: Driving Relieving factors: Gabapentin   PRECAUTIONS: RC repair  RED FLAGS: None   WEIGHT BEARING RESTRICTIONS: No  FALLS:  Has patient fallen in last 6 months? No  LIVING ENVIRONMENT:  Lives with family- college aged daughter Stairs No AD usage    OCCUPATION: Arts development officer   PLOF: Independent  PATIENT GOALS: Return to using right shoulder for functional activities like lifting objects and reaching overhead    OBJECTIVE:  Note: Objective measures were completed at Evaluation unless otherwise noted.   DIAGNOSTIC FINDINGS:  Discs: Disc spaces are maintained.   C2-3: No significant disc bulge. No neural foraminal stenosis. No central canal stenosis.   C3-4: No disc protrusion. Mild bilateral foraminal stenosis. No central canal stenosis.   C4-5: No disc protrusion. No right foraminal stenosis. Mild left foraminal stenosis. No central canal stenosis.   C5-6: Small central disc protrusion. No foraminal or central canal stenosis.   C6-7: Minimal disc bulge. Mild bilateral foraminal narrowing. No central canal stenosis.   C7-T1: No disc protrusion. Mild bilateral foraminal stenosis. No central canal stenosis.   IMPRESSION: 1. Mild cervical spine spondylosis as described above. 2. No acute osseous injury of the cervical spine.    PATIENT SURVEYS:  FOTO 40 with target of 60   COGNITION: Overall cognitive status: Within functional limits for tasks assessed     SENSATION: Light touch: Impaired  numbness and tingling down shoulder into right palm   POSTURE: Rounded shoulders; R UE guarding position  with elbow flexion  CERVICAL ROM: painful into R side of neck and   Active ROM A/PROM (deg) eval  Flexion 50% R deviation  Extension 20% R deviation  Right lateral flexion 0%  Left lateral flexion 0%  Right rotation 0%  Left rotation 0%   (Blank rows = not tested)   UPPER EXTREMITY ROM:   Active/ ROM Right eval  Shoulder flexion 80  Shoulder extension   Shoulder abduction 90  Shoulder adduction   Shoulder internal rotation To belly  Shoulder external rotation 30  Elbow flexion 125  Elbow extension -20  Wrist flexion 20  Wrist extension 50  Wrist ulnar deviation   Wrist radial deviation   Wrist pronation   Wrist supination   (Blank rows = not tested)   UPPER EXTREMITY MMT:  MMT Right eval  Shoulder flexion 3-  Shoulder extension   Shoulder abduction 3-   Shoulder adduction   Shoulder internal rotation 4  Shoulder external rotation 3-  Middle trapezius   Lower trapezius   Elbow flexion   Elbow extension   Wrist flexion   Wrist extension   Wrist ulnar deviation   Wrist radial deviation   Wrist pronation   Wrist supination   Grip strength (lbs) For next visit  (Blank rows = not tested)  Special Tests:  Reflexes  1+ C7 2+ C6   (+) Distraction (-) Spurling (painful regardless of over pressure or position) (-) ER Lag  C4-T1 myotomal weakness on R    PALPATION:  Hypersensitivity  and TTP of R UT, levator, pec minor, biceps, deltoid, supra   TODAY'S TREATMENT:                                                                                                                                         DATE:    2/18: -PROM R shoulder (gentle) -Gentle GHJ distraction -Light STM to distal triceps  -Pendulums -discussion of posture/positioning   2/12  Exercises - Standing Scapular Retraction  - 3-4 x daily - 7 x weekly - 1 sets - 10 reps - Seated Cervical Retraction  - 3-4 x daily - 7 x weekly - 1 sets - 10 reps - Doorway Pec Stretch at 60 Degrees  Abduction with Arm Straight  - 3-4 x daily - 7 x weekly - 1 sets - 3 reps - 10 hold - Median Nerve Glide  - 3-4 x daily - 7 x weekly - 2 sets - 5 reps    PATIENT EDUCATION: Education details: MOI, diagnosis, prognosis, anatomy, exercise progression, DOMS expectations, acceptable pain,   envelope of function, HEP, POC Person educated: Patient Education method: Explanation, Demonstration, Verbal cues, and Handouts Education comprehension: verbalized understanding, returned demonstration, and verbal cues required  HOME EXERCISE PROGRAM: Access Code: [email or web address] URL: [email or web address]  ASSESSMENT:  CLINICAL IMPRESSION: Pt restricted in available passive ROM due to a combination of self limiting and joint stiffness. Pt able to achieve ~40deg each of passive flexion and abduction today. Also limited in IR/ER, though this did improve with time. Significant guarding present through PROM with c/o numbness in fingers 3-5. Tenderness and pain in distal triceps with palpation. Pt has f/u with MD tomorrow and plans to discuss her ongoing pain which is limiting her with PT performance.   Eval: Patient is a 53 y.o. female who was seen today for physical therapy evaluation and treatment s/p RC repair on 10/31. Therapy paused previously due to R sided radicular pain. Pt's s/s appear consistent with potential R cervical radiculopathy. A component of TOS is also on differential diagnosis list. Pt does have fatiguing weakness through C4-T1 myotomes as well as history of cubital and carpal tunnel syndromes. Pt's cervical ROM is highly limited despite normal appear MRI report. Pt's pain is extremely sensitive and irritable with movement. Pt's R UE and neck muscle spasm and guarding are limiting factors to her R shoulder rotator cuff rehab program. Plan to prioritize reduction of R upper quarter pain and muscle spasm at future sessions and introduce R shoulder strengthening and ROM as tolerated. Pt also  to have OT consult at a future visit. Pt would benefit from continued skilled therapy in order to reach goals and maximize functional R UE strength and ROM for full return to PLOF. [name]     OBJECTIVE IMPAIRMENTS: decreased ROM, decreased strength, impaired sensation, impaired UE functional use, and pain.   ACTIVITY LIMITATIONS: carrying, lifting, reach over head, and  caring for others  PARTICIPATION LIMITATIONS: meal prep, cleaning, laundry, driving, shopping, community activity, and occupation  PERSONAL FACTORS: Fitness, Past/current experiences, Time since onset of injury/illness/exacerbation, and 1-2 comorbidities:    are also affecting patient's functional outcome.   REHAB POTENTIAL: Fair  CLINICAL DECISION MAKING: unstable/complicated  EVALUATION COMPLEXITY: moderate   GOALS: Goals reviewed with patient? No  SHORT TERM GOALS: Target date: 04/25/2023  PT reviewed the following HEP with patient with patient able to demonstrate a set of the following with min cuing for correction needed. PT educated patient on parameters of therex (how/when to inc/decrease intensity, frequency, rep/set range, stretch hold time, and purpose of therex) with verbalized understanding.  Baseline: NT 04/17/23: Performing independently  Goal status: ONGOING   2.  Patient will demonstrate right shoulder PROM flexion to >=100 degrees and abduction to >=90 degrees as evidence of tissue healing.  Baseline: Shoulder Flex R/L NT/180, Shoulder Abd R/L NT/180  Goal status: ONGOING      LONG TERM GOALS: Target date: 10/17/2023   Patient will have improved function and activity level as evidenced by an increase in FOTO score by 10 points or more.  Baseline: 40  Goal status: ONGOING   2.  Patient will improve right shoulder AROM to be symmetrical with left shoulder AROM for improved RUE function to perform reaching tasks like grabbing objects off a shelf.  Baseline: Shoulder Flex R/L NT/180, Shoulder Abd R/L  NT/180  Goal status: ONGOING   3.  Patient will improve right shoulder strength to  Baseline:  Shoulder Flex R/L NT/5, Shoulder Abd R/L NT/5, Shoulder ER R/L NT/NT, Shoulder IR R/L NT/NT  Goal status: ONGOING    PLAN:  PT FREQUENCY: 1-2x/week  PT DURATION: 12 wks  PLANNED INTERVENTIONS: 97164- PT Re-evaluation, 97110-Therapeutic exercises, 97530- Therapeutic activity, 97112- Neuromuscular re-education, 97535- Self Care, 16109- Manual therapy, 97014- Electrical stimulation (unattended), [phone]- Electrical stimulation (manual), Patient/Family education, Taping, Dry Needling, Joint mobilization, Joint manipulation, Spinal manipulation, Spinal mobilization, Scar mobilization, DME instructions, Cryotherapy, and Moist heat  PLAN FOR NEXT SESSION: cervical retraction with or without manual traction; review nerve gliding, R UE scapular depression, R scapular retraction, cervical ROM focus, pain management in cervical and R upper quarter as pain is limiting RC progression   Donnel Saxon Ishi Danser, PTA 07/25/2023, 1:28 PM

## 2023-07-26 ENCOUNTER — Ambulatory Visit (HOSPITAL_BASED_OUTPATIENT_CLINIC_OR_DEPARTMENT_OTHER): Payer: Commercial Managed Care - PPO | Admitting: Orthopaedic Surgery

## 2023-07-26 DIAGNOSIS — S46011A Strain of muscle(s) and tendon(s) of the rotator cuff of right shoulder, initial encounter: Secondary | ICD-10-CM | POA: Diagnosis not present

## 2023-07-26 DIAGNOSIS — R200 Anesthesia of skin: Secondary | ICD-10-CM | POA: Diagnosis not present

## 2023-07-26 NOTE — Progress Notes (Signed)
Post Operative Evaluation    Procedure/Date of Surgery:  right rotator cuff repair 10/31   Interval History:   Presents today for continued status post right shoulder rotator repair recovery.  She is still having persistent nerve type symptoms down the arm.  She has recently met with Dr. Denese Killings who has discussed the possibility of both cubital tunnel and carpal tunnel syndrome and recommended occupational therapy for nerve glide type therapy.  At this time she is making slow but steady progress with the shoulder.  PMH/PSH/Family History/Social History/Meds/Allergies:    Past Medical History:  Diagnosis Date   Allergy    Anemia    Anxiety    claustrophobic   Asthma    Back pain    Biceps tendonosis of right shoulder    Cataract    Mixed OU   COVID-19    covid PNA hospitalized 2020, 06/06/21   Diabetes mellitus 2011   did not start metforfin, losing weight   Difficult intubation    tooth got chipped one time   Dyspnea    Essential hypertension    Fatty liver    Food allergy    Frozen shoulder    left, had steroid injection 10/05/21   Headache disorder    History of concussion    HLD (hyperlipidemia)    Hypertensive retinopathy    OU   IBS (irritable bowel syndrome)    Infertility, female    Joint pain    Lactose intolerance    Leg edema    Migraines    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   Palpitation    Post-menopausal    Seborrheic dermatitis    Shoulder impingement syndrome, right    Stage 3a chronic kidney disease (CKD) (HCC)    Type 2 diabetes mellitus with complication, with long-term current use of insulin (HCC) 2018   Uveitis    [facility] in 2020 seen specialist in [place] [facility] last seen 11 or 05/2020   Vitamin D deficiency    Past Surgical History:  Procedure Laterality Date   ABDOMINAL HYSTERECTOMY  2006   heavy menses, endometriosis, l oophrectomy   BREAST BIOPSY Right 2018   benign    BREAST EXCISIONAL BIOPSY Right 2003   benign   BREAST EXCISIONAL BIOPSY Right 1999   benign   COLONOSCOPY  10/06/2020   INGUINAL HERNIA REPAIR Left 2003   LEFT OOPHORECTOMY  2006   LUMBAR FUSION  2005   L5-S1   SHOULDER ARTHROSCOPY WITH ROTATOR CUFF REPAIR AND SUBACROMIAL DECOMPRESSION  04/06/2023   Procedure: SHOULDER ARTHROSCOPY WITH ROTATOR CUFF REPAIR , EXTENSIVE DEBRIDEMENT, AND SUBACROMIAL DECOMPRESSION;  Surgeon: Huel Cote, MD;  Location: [facility];  Service: Orthopedics;;   SHOULDER ARTHROSCOPY WITH SUBACROMIAL DECOMPRESSION AND OPEN ROTATOR C Right 07/14/2016   Procedure: RIGHT SHOULDER ARTHROSCOPY WITH SUBACROMIAL DECOMPRESSION, DISTAL CLAVICLE RESECTION AND MINI OPEN ROTATOR CUFF REPAIR, OPEN BICEP TENDODESIS;  Surgeon: Valeria Batman, MD;  Location: [facility];  Service: Orthopedics;  Laterality: Right;   SHOULDER CLOSED REDUCTION Right 09/08/2016   Procedure: RIGHT CLOSED MANIPULATION SHOULDER;  Surgeon: Valeria Batman, MD;  Location: [facility];  Service: Orthopedics;  Laterality: Right;   TUBAL LIGATION     UPPER GASTROINTESTINAL ENDOSCOPY  10/06/2020   Social History  Socioeconomic History   Marital status: Divorced    Spouse name: Not on file   Number of children: 2   Years of education: 29   Highest education level: Master's degree (e.g., MA, MS, MEng, MEd, MSW, MBA)  Occupational History   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   Right-handed.   1-3 cups caffeine weekly.   Social Drivers of Corporate investment banker Strain: Not on file   Food Insecurity: Not on file  Transportation Needs: Not on file  Physical Activity: Not on file  Stress: Not on file  Social Connections: Not on file   Family History  Problem Relation Age of Onset   Diabetes Mother    Heart disease Mother 62   Hypertension Mother    Hyperlipidemia Mother    Heart failure Mother    Stroke Mother    Thyroid disease Mother    Depression Mother    Sleep apnea Mother    Obesity Mother    Colon polyps Mother    Cancer Father 14       Lung Cancer   Heart disease Father    Mental illness Sister        bipolar, substance abuse,  clean 2 yrs   Hypertension Sister    Cancer Paternal Grandmother 60       breast cancer   Breast cancer Paternal Grandmother    Diabetes Maternal Grandmother    Hypertension Maternal Grandmother    Diabetes Son    Colon cancer Neg Hx    Esophageal cancer Neg Hx    Rectal cancer Neg Hx    Stomach cancer Neg Hx    Allergies  Allergen Reactions   Bamlanivimab Anaphylaxis, Itching, Palpitations, Rash, Shortness Of Breath and Swelling   Botox [Onabotulinumtoxina] Shortness Of Breath    syncope   Clindamycin/Lincomycin Other (See Comments)   Contrast Media [Iodinated Contrast Media] Shortness Of Breath and Palpitations   Kiwi Extract Shortness Of Breath   Maxalt [Rizatriptan Benzoate] Anaphylaxis    Chest pain   Nitrous Oxide Shortness Of Breath    syncope   Rizatriptan Benzoate Anaphylaxis    Chest pain   Triptans Shortness Of Breath   Vyepti [Eptinezumab-Jjmr] Shortness Of Breath, Palpitations and Other (See Comments)    Throat soreness, tachycardia   Aspirin Nausea And Vomiting    Mouth blisters   Haemophilus Influenzae Vaccines    Latex     Sometimes causes rash, welts   Mango Flavoring Agent (Non-Screening) Swelling    Lips swell   Vicodin [Hydrocodone-Acetaminophen]     hallucinations   Erythromycin Rash   Flagyl [Metronidazole] Rash   Influenza Virus Vaccine Palpitations   Penicillins Nausea And  Vomiting, Rash and Other (See Comments)    Did it involve swelling of the face/tongue/throat, SOB, or low BP? No Did it involve sudden or severe rash/hives, skin peeling, or any reaction on the inside of your mouth or nose? No Did you need to seek medical attention at a hospital or doctor's office?  Yes When did it last happen?     patient was 53 years old  If all above answers are "NO", may proceed with cephalosporin use.   Tetracyclines & Related Nausea And Vomiting and Rash   Current Outpatient Medications  Medication Sig Dispense Refill   gabapentin (NEURONTIN) 100 MG capsule Take 1 capsule (100 mg total) by mouth 3 (three) times daily. 30 capsule 1   albuterol (VENTOLIN HFA) 108 (90 Base) MCG/ACT inhaler Inhale 2 puffs into the lungs every 6 (six) hours as needed for wheezing or shortness of breath. 18 g 0   aspirin EC 81 MG tablet Take 1 tablet (81 mg total) by mouth daily. Swallow whole. 90 tablet 3   Atogepant (QULIPTA) 60 MG TABS Take 1 tablet (60 mg total) by mouth daily. (Patient taking differently: Take 1 tablet by mouth at bedtime.) 30 tablet 11   atorvastatin (LIPITOR) 20 MG tablet Take 1 tablet (20 mg total) by mouth daily. (Patient taking differently: Take 20 mg by mouth at bedtime.) 90 tablet 3   butalbital-acetaminophen-caffeine (FIORICET) 50-325-40 MG tablet Take 1 tablet by mouth every 6 (six) hours as needed for headache. Do not refill in less than 30 day 30 tablet 5   Continuous Glucose Sensor (DEXCOM G7 SENSOR) MISC Apply 1 sensor every 10 days. (Patient not taking: Reported on 04/12/2023) 9 each 4   diazepam (VALIUM) 5 MG tablet Take 1 tablet (5 mg total) by mouth every 12 (twelve) hours as needed for anxiety. (Patient taking differently: Take 5 mg by mouth every 12 (twelve) hours as needed (migraines).) 30 tablet 5   dicyclomine (BENTYL) 20 MG tablet Take 1 tablet (20 mg total) by mouth every 6 (six) hours. (Patient taking differently: Take 20 mg by mouth 3 (three) times daily  as needed.) 90 tablet 1   EPINEPHrine 0.3 mg/0.3 mL IJ SOAJ injection Inject 0.3 mg into the muscle as needed for anaphylaxis. 2 each 0   fluconazole (DIFLUCAN) 150 MG tablet Take 1 tablet (150 mg total) by mouth daily. 5 tablet 0   Ipratropium-Albuterol (COMBIVENT) 20-100 MCG/ACT AERS respimat Inhale 1 puff into the lungs every 6 (six) hours. (Patient taking differently: Inhale 1 puff into the lungs every 6 (six) hours as needed for shortness of breath.) 4 g 11   losartan-hydrochlorothiazide (HYZAAR) 50-12.5 MG tablet Take 1 tablet by mouth daily. 90 tablet 0   meloxicam (MOBIC) 15 MG tablet Take 1 tablet (15 mg total) by mouth daily. 14 tablet 0   metFORMIN (GLUCOPHAGE-XR) 500 MG 24 hr tablet Take 2 tablets (1,000 mg total) by mouth 2 (two) times daily with a meal. 1000mg  at breakfast and 1000 mg at dinner 96 tablet 0   metoprolol succinate (TOPROL-XL) 25 MG 24 hr tablet Take 0.5 tablets (12.5 mg total) by mouth daily. (Patient taking differently: Take 12.5 mg by mouth at bedtime.) 45 tablet 0   ondansetron (ZOFRAN-ODT) 4 MG disintegrating tablet Take 1 tablet (4 mg total) by mouth every 8 (eight) hours as needed for nausea or vomiting. 20 tablet 0   oxyCODONE (ROXICODONE) 5 MG immediate release tablet Take 1 tablet (5 mg total) by mouth every 4 (four) hours as needed for severe pain (pain score 7-10) or breakthrough pain. 15 tablet 0   pregabalin (LYRICA) 75 MG capsule Take 1 capsule (75 mg total) by mouth 2 (two) times daily. 60 capsule 0   Semaglutide, 1 MG/DOSE, (OZEMPIC, 1 MG/DOSE,) 4 MG/3ML SOPN Inject 1 mg into the skin once a  week as directed. 9 mL 1   Ubrogepant (UBRELVY) 100 MG TABS Take 1 tablet (100 mg total) by mouth as needed. May repeat dose in 2 hours if needed. Max dose 2 tablets in 2 hours 16 tablet 11   Vitamin D, Ergocalciferol, (DRISDOL) 1.25 MG (50000 UNIT) CAPS capsule Take 1 capsule by mouth once a week. (Patient taking differently: Take 50,000 Units by mouth once a week.  Sunday) 4 capsule 11   No current facility-administered medications for this visit.   No results found.   Review of Systems:   A ROS was performed including pertinent positives and negatives as documented in the HPI.   Musculoskeletal Exam:    There were no vitals taken for this visit.  Right shoulder incisions are well-appearing without erythema or drainage.  She does have paresthesias down the middle finger and ring finger of the right hand.  She is able to fire wrist extensors as well as thumb extensor.  She is able to fire at the biceps.  Remainder of sensory exam is intact.  Positive Tinel and Phalen right carpal tunnel  Imaging:     MRI cervical spine: normal  I personally reviewed and interpreted the radiographs.   Assessment:   status post right shoulder rotator cuff repair.  Much of her pain does appear to be neuropathic in nature.  Given this I would like to plan to refer her to my partner Dr. Carlis Abbott for discussion of nonnarcotic nerve type pain or any type of desensitization type therapy she may think are beneficial.  She will continue to work in occupational and physical therapy for the shoulder and range of motion.  Plan to see her back in 2 months for reassessment. Plan :    -Return to clinic 2 month for reassessment      I personally saw and evaluated the patient, and participated in the management and treatment plan.  Huel Cote, MD Attending Physician, Orthopedic Surgery  This document was dictated using Dragon voice recognition software. A reasonable attempt at proof reading has been made to minimize errors.

## 2023-07-27 ENCOUNTER — Telehealth: Payer: Commercial Managed Care - PPO | Admitting: Internal Medicine

## 2023-07-27 ENCOUNTER — Encounter: Payer: Self-pay | Admitting: Internal Medicine

## 2023-07-27 DIAGNOSIS — E66811 Obesity, class 1: Secondary | ICD-10-CM

## 2023-07-27 DIAGNOSIS — E7849 Other hyperlipidemia: Secondary | ICD-10-CM

## 2023-07-27 DIAGNOSIS — E1140 Type 2 diabetes mellitus with diabetic neuropathy, unspecified: Secondary | ICD-10-CM

## 2023-07-27 DIAGNOSIS — Z7984 Long term (current) use of oral hypoglycemic drugs: Secondary | ICD-10-CM | POA: Diagnosis not present

## 2023-07-27 DIAGNOSIS — Z7985 Long-term (current) use of injectable non-insulin antidiabetic drugs: Secondary | ICD-10-CM | POA: Diagnosis not present

## 2023-07-27 NOTE — Progress Notes (Signed)
Patient ID: [name], female   DOB: 10-May-1971, 53 y.o.   MRN: [id number]  HPI: [name] is a 53 y.o.-year-old female, returning for follow-up for DM2, dx in 2013, with history of GDM in 1990 and 2004, insulin-dependent since 2018, uncontrolled, with complications (mild CKD, PN).  She was previously seen by Dr. Everardo All. Last visit with me 6 months ago.  Interim history: No increased urination, blurry vision, nausea.  She had shoulder sx 04/06/2023. She has pbs with nerve compression, carpal and cubital tunnel sd.  She will need another surgery during the summer.  She is in PT/OT and started Lyrica. Sugars are a little higher. She is exercising 5x a week at least 30 min a day: zumba, dancing.  Reviewed HbA1c: Lab Results  Component Value Date   HGBA1C 5.9 (A) 01/17/2023   HGBA1C 6.3 (A) 06/30/2022   HGBA1C 6.5 (A) 01/28/2022   HGBA1C 8.1 (A) 10/19/2021   HGBA1C 7.5 (A) 08/03/2021   HGBA1C 7.6 (A) 05/03/2021   HGBA1C 7.0 (A) 01/26/2021   HGBA1C 6.9 (A) 10/26/2020   HGBA1C 6.1 (A) 07/28/2020   HGBA1C 6.0 (A) 01/30/2020   Previous on: - Ozempic 2 mg weekly - Humalog 22 -24 (30) units 2x a day after meals (patient has mostly 2 meals a day) She tried Victoza >> dysphagia. She tried Jardiance >> yeast infections. She was taking Metformin 11/2020 after he had IBS symptoms.  Now on: - Ozempic 2 >> 1 mg weekly (decreased 08/2022 2/2 hypoglycemia) -  N/V/D with higher doses >> stopped >> 500 mg with dinner  - >> now off  Pt checks her sugars more than 4 times a day with her Dexcom G7 CGM:  Previously:  Previously:  Lowest sugar was 63 (sensor) >> 45 >> 68; she has hypoglycemia awareness at 70.  Highest sugar was 300s >> ... 200 >> 190s >> 265.  -+ Stage IIIa CKD, last BUN/creatinine:  Lab Results  Component Value Date   BUN 12 03/29/2023   BUN 10 07/01/2022   CREATININE 0.70 03/29/2023   CREATININE 0.69 07/01/2022   Lab Results  Component Value Date    MICRALBCREAT 1.3 10/23/2018   MICRALBCREAT 3.2 11/20/2017   MICRALBCREAT 14.4 07/06/2017   MICRALBCREAT 1.5 05/18/2016   MICRALBCREAT 0.8 07/24/2015   MICRALBCREAT 1.0 01/19/2015   MICRALBCREAT 1.2 06/10/2014   MICRALBCREAT 1.8 05/06/2013   MICRALBCREAT 1.7 07/20/2011  She is not on ACE inhibitor/ARB.  -+ HL; last set of lipids: Lab Results  Component Value Date   CHOL 125 07/01/2022   HDL 34 (L) 07/01/2022   LDLCALC 73 07/01/2022   LDLDIRECT 88.0 09/21/2021   TRIG 95 07/01/2022   CHOLHDL 3.7 07/01/2022  On Lipitor 20 mg daily.  - last eye exam was 05/10/2023. No [facility].  - + numbness and tingling in her feet. Last foot exam: 07/01/2022.  On ASA 81.  Patient also has a history of HTN, fatty liver, IBS.  ROS: + see HPI  Past Medical History:  Diagnosis Date   Allergy    Anemia    Anxiety    claustrophobic   Asthma    Back pain    Biceps tendonosis of right shoulder    Cataract    Mixed OU   COVID-19    covid PNA hospitalized 2020, 06/06/21   Diabetes mellitus 2011   did not start metforfin, losing weight   Difficult intubation    tooth got chipped one time   Dyspnea  Essential hypertension    Fatty liver    Food allergy    Frozen shoulder    left, had steroid injection 10/05/21   Headache disorder    History of concussion    HLD (hyperlipidemia)    Hypertensive retinopathy    OU   IBS (irritable bowel syndrome)    Infertility, female    Joint pain    Lactose intolerance    Leg edema    Migraines    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   Palpitation    Post-menopausal    Seborrheic dermatitis    Shoulder impingement syndrome, right    Stage 3a chronic kidney disease (CKD) (HCC)    Type 2 diabetes mellitus with complication, with long-term current use of insulin (HCC) 2018   Uveitis    [facility] in 2020 seen specialist in [place] [facility] last seen 11 or 05/2020   Vitamin D deficiency    Past Surgical History:   Procedure Laterality Date   ABDOMINAL HYSTERECTOMY  2006   heavy menses, endometriosis, l oophrectomy   BREAST BIOPSY Right 2018   benign   BREAST EXCISIONAL BIOPSY Right 2003   benign   BREAST EXCISIONAL BIOPSY Right 1999   benign   COLONOSCOPY  10/06/2020   INGUINAL HERNIA REPAIR Left 2003   LEFT OOPHORECTOMY  2006   LUMBAR FUSION  2005   L5-S1   SHOULDER ARTHROSCOPY WITH ROTATOR CUFF REPAIR AND SUBACROMIAL DECOMPRESSION  04/06/2023   Procedure: SHOULDER ARTHROSCOPY WITH ROTATOR CUFF REPAIR , EXTENSIVE DEBRIDEMENT, AND SUBACROMIAL DECOMPRESSION;  Surgeon: Huel Cote, MD;  Location: [facility];  Service: Orthopedics;;   SHOULDER ARTHROSCOPY WITH SUBACROMIAL DECOMPRESSION AND OPEN ROTATOR C Right 07/14/2016   Procedure: RIGHT SHOULDER ARTHROSCOPY WITH SUBACROMIAL DECOMPRESSION, DISTAL CLAVICLE RESECTION AND MINI OPEN ROTATOR CUFF REPAIR, OPEN BICEP TENDODESIS;  Surgeon: Valeria Batman, MD;  Location: [facility];  Service: Orthopedics;  Laterality: Right;   SHOULDER CLOSED REDUCTION Right 09/08/2016   Procedure: RIGHT CLOSED MANIPULATION SHOULDER;  Surgeon: Valeria Batman, MD;  Location: [facility];  Service: Orthopedics;  Laterality: Right;   TUBAL LIGATION     UPPER GASTROINTESTINAL ENDOSCOPY  10/06/2020   Social History   Socioeconomic History   Marital status: Divorced    Spouse name: Not on file   Number of children: 2   Years of education: 14   Highest education level: Master's degree (e.g., MA, MS, MEng, MEd, MSW, MBA)  Occupational History   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   Right-handed.   1-3 cups caffeine weekly.   Social Drivers of Corporate investment banker Strain: Not on file  Food Insecurity: Not on file  Transportation Needs: Not on file  Physical Activity: Not on file  Stress: Not on file  Social Connections: Not on file  Intimate Partner Violence: Not on file   Current Outpatient Medications on File Prior to Visit  Medication Sig Dispense Refill   gabapentin (NEURONTIN) 100 MG  capsule Take 1 capsule (100 mg total) by mouth 3 (three) times daily. 30 capsule 1   albuterol (VENTOLIN HFA) 108 (90 Base) MCG/ACT inhaler Inhale 2 puffs into the lungs every 6 (six) hours as needed for wheezing or shortness of breath. 18 g 0   aspirin EC 81 MG tablet Take 1 tablet (81 mg total) by mouth daily. Swallow whole. 90 tablet 3   Atogepant (QULIPTA) 60 MG TABS Take 1 tablet (60 mg total) by mouth daily. (Patient taking differently: Take 1 tablet by mouth at bedtime.) 30 tablet 11   atorvastatin (LIPITOR) 20 MG tablet Take 1 tablet (20 mg total) by mouth daily. (Patient taking differently: Take 20 mg by mouth at bedtime.) 90 tablet 3   butalbital-acetaminophen-caffeine (FIORICET) 50-325-40 MG tablet Take 1 tablet by mouth every 6 (six) hours as needed for headache. Do not refill in less than 30 day 30 tablet 5   Continuous Glucose Sensor (DEXCOM G7 SENSOR) MISC Apply 1 sensor every 10 days. (Patient not taking: Reported on 04/12/2023) 9 each 4   diazepam (VALIUM) 5 MG tablet Take 1 tablet (5 mg total) by mouth every 12 (twelve) hours as needed for anxiety. (Patient taking differently: Take 5 mg by mouth every 12 (twelve) hours as needed (migraines).) 30 tablet 5   dicyclomine (BENTYL) 20 MG tablet Take 1 tablet (20 mg total) by mouth every 6 (six) hours. (Patient taking differently: Take 20 mg by mouth 3 (three) times daily as needed.) 90 tablet 1   EPINEPHrine 0.3 mg/0.3 mL IJ SOAJ injection Inject 0.3 mg into the muscle as needed for  anaphylaxis. 2 each 0   fluconazole (DIFLUCAN) 150 MG tablet Take 1 tablet (150 mg total) by mouth daily. 5 tablet 0   Ipratropium-Albuterol (COMBIVENT) 20-100 MCG/ACT AERS respimat Inhale 1 puff into the lungs every 6 (six) hours. (Patient taking differently: Inhale 1 puff into the lungs every 6 (six) hours as needed for shortness of breath.) 4 g 11   losartan-hydrochlorothiazide (HYZAAR) 50-12.5 MG tablet Take 1 tablet by mouth daily. 90 tablet 0   meloxicam (MOBIC) 15 MG tablet Take 1 tablet (15 mg total) by mouth daily. 14 tablet 0   metFORMIN (GLUCOPHAGE-XR) 500 MG 24 hr tablet Take 2 tablets (1,000 mg total) by mouth 2 (two) times daily with a meal. 1000mg  at breakfast and 1000 mg at dinner 96 tablet 0   metoprolol succinate (TOPROL-XL) 25 MG 24 hr tablet Take 0.5 tablets (12.5 mg total) by mouth daily. (Patient taking differently: Take 12.5 mg by mouth at bedtime.) 45 tablet 0   ondansetron (ZOFRAN-ODT) 4 MG disintegrating tablet Take 1 tablet (4 mg total) by mouth every 8 (eight) hours as needed for nausea or vomiting. 20 tablet 0   oxyCODONE (ROXICODONE) 5 MG immediate release tablet Take 1 tablet (5 mg total) by mouth every 4 (four) hours as needed for severe pain (pain score 7-10) or breakthrough pain. 15 tablet 0   pregabalin (LYRICA) 75 MG capsule Take 1 capsule (75 mg total) by mouth 2 (two) times daily. 60 capsule 0   Semaglutide, 1 MG/DOSE, (OZEMPIC, 1 MG/DOSE,) 4 MG/3ML SOPN Inject 1 mg into the skin once a week as directed. 9 mL 1   Ubrogepant (UBRELVY) 100 MG TABS Take 1 tablet (100 mg total) by mouth as needed. May repeat dose in 2 hours if needed. Max dose 2 tablets in 2 hours 16 tablet 11   Vitamin D, Ergocalciferol, (DRISDOL) 1.25 MG (50000 UNIT) CAPS capsule  Take 1 capsule by mouth once a week. (Patient taking differently: Take 50,000 Units by mouth once a week. Sunday) 4 capsule 11   No current facility-administered medications on file prior to visit.   Allergies  Allergen  Reactions   Bamlanivimab Anaphylaxis, Itching, Palpitations, Rash, Shortness Of Breath and Swelling   Botox [Onabotulinumtoxina] Shortness Of Breath    syncope   Clindamycin/Lincomycin Other (See Comments)   Contrast Media [Iodinated Contrast Media] Shortness Of Breath and Palpitations   Kiwi Extract Shortness Of Breath   Maxalt [Rizatriptan Benzoate] Anaphylaxis    Chest pain   Nitrous Oxide Shortness Of Breath    syncope   Rizatriptan Benzoate Anaphylaxis    Chest pain   Triptans Shortness Of Breath   Vyepti [Eptinezumab-Jjmr] Shortness Of Breath, Palpitations and Other (See Comments)    Throat soreness, tachycardia   Aspirin Nausea And Vomiting    Mouth blisters   Haemophilus Influenzae Vaccines    Latex     Sometimes causes rash, welts   Mango Flavoring Agent (Non-Screening) Swelling    Lips swell   Vicodin [Hydrocodone-Acetaminophen]     hallucinations   Erythromycin Rash   Flagyl [Metronidazole] Rash   Influenza Virus Vaccine Palpitations   Penicillins Nausea And Vomiting, Rash and Other (See Comments)    Did it involve swelling of the face/tongue/throat, SOB, or low BP? No Did it involve sudden or severe rash/hives, skin peeling, or any reaction on the inside of your mouth or nose? No Did you need to seek medical attention at a hospital or doctor's office? Yes When did it last happen?     patient was 53 years old  If all above answers are "NO", may proceed with cephalosporin use.   Tetracyclines & Related Nausea And Vomiting and Rash   Family History  Problem Relation Age of Onset   Diabetes Mother    Heart disease Mother 28   Hypertension Mother    Hyperlipidemia Mother    Heart failure Mother    Stroke Mother    Thyroid disease Mother    Depression Mother    Sleep apnea Mother    Obesity Mother    Colon polyps Mother    Cancer Father 79       Lung Cancer   Heart disease Father    Mental illness Sister        bipolar, substance abuse,  clean 2 yrs    Hypertension Sister    Cancer Paternal Grandmother 64       breast cancer   Breast cancer Paternal Grandmother    Diabetes Maternal Grandmother    Hypertension Maternal Grandmother    Diabetes Son    Colon cancer Neg Hx    Esophageal cancer Neg Hx    Rectal cancer Neg Hx    Stomach cancer Neg Hx    PE: There were no vitals taken for this visit. 201 lbs yesterday. Wt Readings from Last 3 Encounters:  04/12/23 200 lb 2 oz (90.8 kg)  04/06/23 183 lb (83 kg)  03/29/23 183 lb (83 kg)   Constitutional:  in NAD  The physical exam was not performed (virtual visit).  ASSESSMENT: 1. DM2, insulin-dependent, uncontrolled, with complications - CKD - mild - PN  2. HL  3.  Obesity class I  PLAN:  1. Patient with longstanding, previously uncontrolled type 2 diabetes, on weekly GLP-1 receptor agonist and metformin, with improving control at last visit.  At that time, HbA1c was 5.9%, decreased.  Sugars were  fluctuating within the target range with only occasional mildly hyperglycemic excursions.  There was little variability in the blood sugars.  We did not change the regimen. CGM interpretation: -At today's visit, we reviewed her CGM downloads: It appears that 91% of values are in target range (goal >70%), while 9% 0% are higher than 180 (goal <25%), and 0% are lower than 70 (goal <4%).  The calculated average blood sugar is 139.  The projected HbA1c for the next 3 months (GMI) is 6.6%. -Reviewing the CGM trends, sugars appear to still mostly fluctuating within the target range with occasional hyperglycemic spikes after breakfast and dinner.  We discussed about adjusting diet, however, I do not feel that she absolutely needs a change in regimen.  She is doing a great job exercising 5 times a week and she notices that her sugars are dropping approximately 100 points with exercise.  However, she is not usually dropping too low. - I suggested to:  Patient Instructions  Please continue: - Ozempic  1 mg weekly - Metformin 500 mg daily  Please return in 4 months.  - we will check another HbA1c at next visit, I will see her before her next shoulder surgery. - advised to check sugars at different times of the day - 4x a day, rotating check times - advised for yearly eye exams >> she is UTD - she will need a foot exam at next in person visit - return to clinic in 4 months  2. HL -Latest lipid panel was reviewed from 06/2022: LDL slightly above goal of less than 70, HDL also slightly low: Lab Results  Component Value Date   CHOL 125 07/01/2022   HDL 34 (L) 07/01/2022   LDLCALC 73 07/01/2022   LDLDIRECT 88.0 09/21/2021   TRIG 95 07/01/2022   CHOLHDL 3.7 07/01/2022  -She continues on Lipitor 20 mg daily without side effects -She is due for another lipid panel-will check when she comes for the next in person visit  3.  Obesity class I -She was able to lose approximately 60 pounds after starting Ozempic -At last visit she gained 10 pounds but she was happier with this weight and was trying to maintain it -she checked her weight yesterday and it was stable compared to our last in person visit  Carlus Pavlov, MD PhD [facility] Endocrinology

## 2023-07-27 NOTE — Patient Instructions (Signed)
Please continue: - Ozempic 1 mg weekly - Metformin 500 mg daily  Please return in 4 months.

## 2023-07-28 ENCOUNTER — Encounter: Payer: Self-pay | Admitting: Internal Medicine

## 2023-07-28 ENCOUNTER — Ambulatory Visit (INDEPENDENT_AMBULATORY_CARE_PROVIDER_SITE_OTHER): Payer: Commercial Managed Care - PPO

## 2023-07-28 DIAGNOSIS — E1140 Type 2 diabetes mellitus with diabetic neuropathy, unspecified: Secondary | ICD-10-CM | POA: Diagnosis not present

## 2023-07-28 LAB — POCT GLYCOSYLATED HEMOGLOBIN (HGB A1C): Hemoglobin A1C: 6.5 % — AB (ref 4.0–5.6)

## 2023-07-29 NOTE — Therapy (Unsigned)
 OUTPATIENT PHYSICAL THERAPY SHOULDER TREATMENT   Patient Name: [name] MRN: [id number] DOB:[birth year], 53 y.o., female Today's Date: 08/03/2023  END OF SESSION:  PT End of Session - 08/03/23 0725     Visit Number 11    Number of Visits 24    Date for PT Re-Evaluation 10/17/23    Authorization Type Lanetta Inch 2024    Authorization Time Period 25 PT/OT    Authorization - Visit Number 11    Authorization - Number of Visits 20    Progress Note Due on Visit 19    PT Start Time 0715    PT Stop Time 0758    PT Time Calculation (min) 43 min    Activity Tolerance Patient tolerated treatment well    Behavior During Therapy [facility] -  for tasks assessed/performed              Past Medical History:  Diagnosis Date   Allergy    Anemia    Anxiety    claustrophobic   Asthma    Back pain    Biceps tendonosis of right shoulder    Cataract    Mixed OU   COVID-19    covid PNA hospitalized 2020, 06/06/21   Diabetes mellitus 2011   did not start metforfin, losing weight   Difficult intubation    tooth got chipped one time   Dyspnea    Essential hypertension    Fatty liver    Food allergy    Frozen shoulder    left, had steroid injection 10/05/21   Headache disorder    History of concussion    HLD (hyperlipidemia)    Hypertensive retinopathy    OU   IBS (irritable bowel syndrome)    Infertility, female    Joint pain    Lactose intolerance    Leg edema    Migraines    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   Palpitation    Post-menopausal    Seborrheic dermatitis    Shoulder impingement syndrome, right    Stage 3a chronic kidney disease (CKD) (HCC)    Type 2 diabetes mellitus with complication, with long-term current use of insulin (HCC) 2018   Uveitis    [facility] est in 2020 seen specialist in [place] [facility] last seen 11 or 05/2020   Vitamin D deficiency    Past Surgical History:  Procedure Laterality Date   ABDOMINAL HYSTERECTOMY  2006    heavy menses, endometriosis, l oophrectomy   BREAST BIOPSY Right 2018   benign   BREAST EXCISIONAL BIOPSY Right 2003   benign   BREAST EXCISIONAL BIOPSY Right 1999   benign   COLONOSCOPY  10/06/2020   INGUINAL HERNIA REPAIR Left 2003   LEFT OOPHORECTOMY  2006   LUMBAR FUSION  2005   L5-S1   SHOULDER ARTHROSCOPY WITH ROTATOR CUFF REPAIR AND SUBACROMIAL DECOMPRESSION  04/06/2023   Procedure: SHOULDER ARTHROSCOPY WITH ROTATOR CUFF REPAIR , EXTENSIVE DEBRIDEMENT, AND SUBACROMIAL DECOMPRESSION;  Surgeon: Huel Cote, MD;  Location: [facility];  Service: Orthopedics;;   SHOULDER ARTHROSCOPY WITH SUBACROMIAL DECOMPRESSION AND OPEN ROTATOR C Right 07/14/2016   Procedure: RIGHT SHOULDER ARTHROSCOPY WITH SUBACROMIAL DECOMPRESSION, DISTAL CLAVICLE RESECTION AND MINI OPEN ROTATOR CUFF REPAIR, OPEN BICEP TENDODESIS;  Surgeon: Valeria Batman, MD;  Location: [facility];  Service: Orthopedics;  Laterality: Right;   SHOULDER CLOSED REDUCTION Right 09/08/2016   Procedure: RIGHT CLOSED MANIPULATION SHOULDER;  Surgeon: Valeria Batman, MD;  Location: [facility];  Service: Orthopedics;  Laterality: Right;   TUBAL LIGATION     UPPER GASTROINTESTINAL ENDOSCOPY  10/06/2020   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 09/21/2021   Chronic left shoulder pain 09/21/2021   Fatty infiltration of liver 07/26/2020   Diarrhea, functional 06/22/2020   Uveitis, intermediate, bilateral 09/07/2019   Generalized edema 06/13/2019   Hospital discharge follow-up 06/07/2019   Multinodular goiter 12/05/2017   Microalbuminuria due to type 2 diabetes mellitus (HCC) 11/26/2017   Encounter for preventive health examination 11/26/2017   Menopause syndrome 06/10/2017   Adverse reaction to vaccine, sequela 03/25/2017   Atypical chest  pain 02/27/2017   PSVT (paroxysmal supraventricular tachycardia) (HCC) 11/26/2016   Nontraumatic incomplete tear of right rotator cuff 07/14/2016   AC (acromioclavicular) arthritis 07/14/2016   Impingement syndrome of right shoulder 07/14/2016   Fibrocystic breast changes, right 05/21/2016   Insomnia 10/16/2015   Snoring 07/26/2015   Chronic migraine w/o aura w/o status migrainosus, not intractable 02/24/2015   History of concussion 07/21/2014   Hyperlipidemia associated with type 2 diabetes mellitus (HCC) 06/13/2014   Vitamin D deficiency 05/07/2013   Chronic migraine 11/20/2012   S/P Total Abdominal Hysterectomy and Left Salpingo-oophorectomy 10/20/2011   Headache around the eyes    Essential hypertension 07/20/2011    PCP: Dr. Duncan Dull   REFERRING PROVIDER: Dr. Huel Cote   REFERRING DIAG: Right Rotator Cuff Repair   THERAPY DIAG:  Acute pain of right shoulder  Muscle weakness (generalized)  S/P right rotator cuff repair  Cervicalgia  Chronic right shoulder pain  Stiffness of right shoulder, not elsewhere classified  Rationale for Evaluation and Treatment: Rehabilitation  ONSET DATE: 04/06/23  SUBJECTIVE:                                                                                                                                                                                      SUBJECTIVE STATEMENT:    Pt states that she continues to have same pain. She is going to start OT next week and is potentially looking at getting surgery if all else fails.   Eval: Pt is here for continuation of PT for the shoulder post op but has severe R UE nerve pain. Pt notes that NCV study in the chart is inaccurate. Done in January of 2025 by Dr. Alvester Morin. Pt did get an OT referral for the cubital tunnel and carpal tunnel. Pt does WFH at a laptop for occupation. Last 3 digits will have NT. Pt does  note dropping items and hand weakness. Turning the neck will cause R UE pain.  Pt must turn trunk in order to look over shoulder when driving. Pt notes that NT the Sunday after surgery. Pt has a history of some NT prior and had NCV in 2019 as well.     PERTINENT HISTORY:  Previous RC repair on R side  PAIN:  Are you having pain? Yes: NPRS scale: 3/10 Pain location: ant R shoulder and radiates down into last 3 digits Pain description: Achy and sharp, tingling,shooting  Aggravating factors: Driving Relieving factors: Gabapentin   PRECAUTIONS: RC repair  RED FLAGS: None   WEIGHT BEARING RESTRICTIONS: No  FALLS:  Has patient fallen in last 6 months? No  LIVING ENVIRONMENT:  Lives with family- college aged daughter Stairs No AD usage    OCCUPATION: Arts development officer   PLOF: Independent  PATIENT GOALS: Return to using right shoulder for functional activities like lifting objects and reaching overhead    OBJECTIVE:  Note: Objective measures were completed at Evaluation unless otherwise noted.   DIAGNOSTIC FINDINGS:  Discs: Disc spaces are maintained.   C2-3: No significant disc bulge. No neural foraminal stenosis. No central canal stenosis.   C3-4: No disc protrusion. Mild bilateral foraminal stenosis. No central canal stenosis.   C4-5: No disc protrusion. No right foraminal stenosis. Mild left foraminal stenosis. No central canal stenosis.   C5-6: Small central disc protrusion. No foraminal or central canal stenosis.   C6-7: Minimal disc bulge. Mild bilateral foraminal narrowing. No central canal stenosis.   C7-T1: No disc protrusion. Mild bilateral foraminal stenosis. No central canal stenosis.   IMPRESSION: 1. Mild cervical spine spondylosis as described above. 2. No acute osseous injury of the cervical spine.    PATIENT SURVEYS:  FOTO 40 with target of 60   COGNITION: Overall cognitive status: Within functional limits for tasks assessed     SENSATION: Light touch: Impaired  numbness and tingling down shoulder into  right palm   POSTURE: Rounded shoulders; R UE guarding position with elbow flexion  CERVICAL ROM: painful into R side of neck and   Active ROM A/PROM (deg) eval  Flexion 50% R deviation  Extension 20% R deviation  Right lateral flexion 0%  Left lateral flexion 0%  Right rotation 0%  Left rotation 0%   (Blank rows = not tested)   UPPER EXTREMITY ROM:   Active/ ROM Right eval  Shoulder flexion 80  Shoulder extension   Shoulder abduction 90  Shoulder adduction   Shoulder internal rotation To belly  Shoulder external rotation 30  Elbow flexion 125  Elbow extension -20  Wrist flexion 20  Wrist extension 50  Wrist ulnar deviation   Wrist radial deviation   Wrist pronation   Wrist supination   (Blank rows = not tested)   UPPER EXTREMITY MMT:  MMT Right eval  Shoulder flexion 3-  Shoulder extension   Shoulder abduction 3-   Shoulder adduction   Shoulder internal rotation 4  Shoulder external rotation 3-  Middle trapezius   Lower trapezius   Elbow flexion   Elbow extension   Wrist flexion   Wrist extension   Wrist ulnar deviation   Wrist radial deviation   Wrist pronation   Wrist supination   Grip strength (lbs) For next visit  (Blank rows = not tested)  Special Tests:  Reflexes  1+ C7 2+ C6   (+) Distraction (-) Spurling (painful regardless of over pressure or position) (-) ER  Lag  C4-T1 myotomal weakness on R    PALPATION:  Hypersensitivity and TTP of R UT, levator, pec minor, biceps, deltoid, supra   TODAY'S TREATMENT:                                                                                                                                         DATE:  08/03/2023: - PROM R shoulder (gentle) - PROM to elbow (gentle)  - Discussed plan going forward with OT/PT, and other MD appts.  - Therapeutic putty squeezes.  - Towel squeezes/ twists.  - Discussed desensitization to R shoulder with different materials and textures to help  desensitize shoulder area.   2/18: -PROM R shoulder (gentle) -Gentle GHJ distraction -Light STM to distal triceps  -Pendulums -discussion of posture/positioning   2/12  Exercises - Standing Scapular Retraction  - 3-4 x daily - 7 x weekly - 1 sets - 10 reps - Seated Cervical Retraction  - 3-4 x daily - 7 x weekly - 1 sets - 10 reps - Doorway Pec Stretch at 60 Degrees Abduction with Arm Straight  - 3-4 x daily - 7 x weekly - 1 sets - 3 reps - 10 hold - Median Nerve Glide  - 3-4 x daily - 7 x weekly - 2 sets - 5 reps    PATIENT EDUCATION: Education details: MOI, diagnosis, prognosis, anatomy, exercise progression, DOMS expectations, acceptable pain,   envelope of function, HEP, POC Person educated: Patient Education method: Explanation, Demonstration, Verbal cues, and Handouts Education comprehension: verbalized understanding, returned demonstration, and verbal cues required  HOME EXERCISE PROGRAM: Access Code: [email or web address] URL: [email or web address]  ASSESSMENT:  CLINICAL IMPRESSION: Pt reports to PT with continued pain noted in shoulder and N/T into hand. She is unable to tolerate manual due to hypersensitivity at this time. Discussed desensitization techniques. Gentle PROM provided to shoulder with pt reporting spasms and increased tingling sensation in hand. Pt only able to withstand ~70 degrees of flexion/ abduction at this time. Encouraged AAROM with table slides to keep shoulder mobile at home to avoid adhesive capsulitis. Provided pt with putty to utilize at home to maintain hand strength. Also encouraged towel squeezes and twists with no adverse effects today. Pt has a plan with MD to try OT, scheduled to see other specialists in future with possible surgery if all else fails. At this time she plans to see if OT helps and may put PT on hold due to minimal progressions at this time.   Eval: Patient is a 53 y.o. female who was seen today for physical therapy  evaluation and treatment s/p RC repair on 10/31. Therapy paused previously due to R sided radicular pain. Pt's s/s appear consistent with potential R cervical radiculopathy. A component of TOS is also on differential diagnosis list. Pt does have fatiguing weakness through C4-T1 myotomes as well as history of cubital and carpal tunnel syndromes. Pt's  cervical ROM is highly limited despite normal appear MRI report. Pt's pain is extremely sensitive and irritable with movement. Pt's R UE and neck muscle spasm and guarding are limiting factors to her R shoulder rotator cuff rehab program. Plan to prioritize reduction of R upper quarter pain and muscle spasm at future sessions and introduce R shoulder strengthening and ROM as tolerated. Pt also to have OT consult at a future visit. Pt would benefit from continued skilled therapy in order to reach goals and maximize functional R UE strength and ROM for full return to PLOF. [name]     OBJECTIVE IMPAIRMENTS: decreased ROM, decreased strength, impaired sensation, impaired UE functional use, and pain.   ACTIVITY LIMITATIONS: carrying, lifting, reach over head, and caring for others  PARTICIPATION LIMITATIONS: meal prep, cleaning, laundry, driving, shopping, community activity, and occupation  PERSONAL FACTORS: Fitness, Past/current experiences, Time since onset of injury/illness/exacerbation, and 1-2 comorbidities:    are also affecting patient's functional outcome.   REHAB POTENTIAL: Fair  CLINICAL DECISION MAKING: unstable/complicated  EVALUATION COMPLEXITY: moderate   GOALS: Goals reviewed with patient? No  SHORT TERM GOALS: Target date: 04/25/2023  PT reviewed the following HEP with patient with patient able to demonstrate a set of the following with min cuing for correction needed. PT educated patient on parameters of therex (how/when to inc/decrease intensity, frequency, rep/set range, stretch hold time, and purpose of therex) with verbalized  understanding.  Baseline: NT 04/17/23: Performing independently  Goal status: ONGOING   2.  Patient will demonstrate right shoulder PROM flexion to >=100 degrees and abduction to >=90 degrees as evidence of tissue healing.  Baseline: Shoulder Flex R/L NT/180, Shoulder Abd R/L NT/180  Goal status: ONGOING      LONG TERM GOALS: Target date: 10/17/2023   Patient will have improved function and activity level as evidenced by an increase in FOTO score by 10 points or more.  Baseline: 40  Goal status: ONGOING   2.  Patient will improve right shoulder AROM to be symmetrical with left shoulder AROM for improved RUE function to perform reaching tasks like grabbing objects off a shelf.  Baseline: Shoulder Flex R/L NT/180, Shoulder Abd R/L NT/180  Goal status: ONGOING   3.  Patient will improve right shoulder strength to  Baseline:  Shoulder Flex R/L NT/5, Shoulder Abd R/L NT/5, Shoulder ER R/L NT/NT, Shoulder IR R/L NT/NT  Goal status: ONGOING    PLAN:  PT FREQUENCY: 1-2x/week  PT DURATION: 12 wks  PLANNED INTERVENTIONS: 97164- PT Re-evaluation, 97110-Therapeutic exercises, 97530- Therapeutic activity, 97112- Neuromuscular re-education, 97535- Self Care, 96295- Manual therapy, 97014- Electrical stimulation (unattended), [id number]- Electrical stimulation (manual), Patient/Family education, Taping, Dry Needling, Joint mobilization, Joint manipulation, Spinal manipulation, Spinal mobilization, Scar mobilization, DME instructions, Cryotherapy, and Moist heat  PLAN FOR NEXT SESSION: cervical retraction with or without manual traction; review nerve gliding, R UE scapular depression, R scapular retraction, cervical ROM focus, pain management in cervical and R upper quarter as pain is limiting RC progression   Champ Mungo, PT 08/03/2023, 8:07 AM

## 2023-07-31 ENCOUNTER — Encounter: Payer: Self-pay | Admitting: Physical Medicine and Rehabilitation

## 2023-07-31 ENCOUNTER — Encounter: Payer: Self-pay | Admitting: Orthopedic Surgery

## 2023-08-01 ENCOUNTER — Encounter: Payer: Self-pay | Admitting: Obstetrics & Gynecology

## 2023-08-01 ENCOUNTER — Other Ambulatory Visit (HOSPITAL_COMMUNITY)
Admission: RE | Admit: 2023-08-01 | Discharge: 2023-08-01 | Disposition: A | Discharge status: Home / Self Care | Payer: Commercial Managed Care - PPO | Source: Ambulatory Visit | Attending: Obstetrics & Gynecology | Admitting: Obstetrics & Gynecology

## 2023-08-01 ENCOUNTER — Ambulatory Visit: Payer: Commercial Managed Care - PPO | Admitting: Obstetrics & Gynecology

## 2023-08-01 VITALS — BP 135/87 | HR 73 | Wt 201.8 lb

## 2023-08-01 DIAGNOSIS — Z113 Encounter for screening for infections with a predominantly sexual mode of transmission: Secondary | ICD-10-CM | POA: Diagnosis not present

## 2023-08-01 DIAGNOSIS — Z01419 Encounter for gynecological examination (general) (routine) without abnormal findings: Secondary | ICD-10-CM

## 2023-08-01 NOTE — Progress Notes (Signed)
 GYNECOLOGY ANNUAL PREVENTATIVE CARE ENCOUNTER NOTE  History:     [name] is a 53 y.o. [phone] female here for a routine annual gynecologic exam.  She is s/p TAH/LSO for pelvic pain and AUB in 2006. Current complaints: none.  Desires usual annual STI screen.  Denies abnormal vaginal bleeding, discharge, pelvic pain, problems with intercourse or other gynecologic concerns.    Gynecologic History No LMP recorded. Patient has had a hysterectomy. Last mammogram: 06/27/2023. Results were: normal. Last colonoscopy: 12/03/2020. Results were: normal.  Obstetric History OB History  Gravida Para Term Preterm AB Living  3 2 2  0 1 0  SAB IAB Ectopic Multiple Live Births  0 0 1 0 2    # Outcome Date GA Lbr Len/2nd Weight Sex Type Anes PTL Lv  3 Term           2 Term           1 Ectopic             Past Medical History:  Diagnosis Date   Allergy    Anemia    Anxiety    claustrophobic   Asthma    Back pain    Biceps tendonosis of right shoulder    Cataract    Mixed OU   COVID-19    covid PNA hospitalized 2020, 06/06/21   Diabetes mellitus 2011   did not start metforfin, losing weight   Difficult intubation    tooth got chipped one time   Dyspnea    Essential hypertension    Fatty liver    Food allergy    Frozen shoulder    left, had steroid injection 10/05/21   Headache disorder    History of concussion    HLD (hyperlipidemia)    Hypertensive retinopathy    OU   IBS (irritable bowel syndrome)    Infertility, female    Joint pain    Lactose intolerance    Leg edema    Migraines    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   Palpitation    Post-menopausal    Seborrheic dermatitis    Shoulder impingement syndrome, right    Stage 3a chronic kidney disease (CKD) (HCC)    Type 2 diabetes mellitus with complication, with long-term current use of insulin (HCC) 2018   Uveitis    [facility] est in 2020 seen specialist in [place] [facility] last seen 11 or  05/2020   Vitamin D deficiency     Past Surgical History:  Procedure Laterality Date   ABDOMINAL HYSTERECTOMY  2006   heavy menses, endometriosis, l oophrectomy   BREAST BIOPSY Right 2018   benign   BREAST EXCISIONAL BIOPSY Right 2003   benign   BREAST EXCISIONAL BIOPSY Right 1999   benign   COLONOSCOPY  10/06/2020   INGUINAL HERNIA REPAIR Left 2003   LEFT OOPHORECTOMY  2006   LUMBAR FUSION  2005   L5-S1   SHOULDER ARTHROSCOPY WITH ROTATOR CUFF REPAIR AND SUBACROMIAL DECOMPRESSION  04/06/2023   Procedure: SHOULDER ARTHROSCOPY WITH ROTATOR CUFF REPAIR , EXTENSIVE DEBRIDEMENT, AND SUBACROMIAL DECOMPRESSION;  Surgeon: Huel Cote, MD;  Location: [facility];  Service: Orthopedics;;   SHOULDER ARTHROSCOPY WITH SUBACROMIAL DECOMPRESSION AND OPEN ROTATOR C Right 07/14/2016   Procedure: RIGHT SHOULDER ARTHROSCOPY WITH SUBACROMIAL DECOMPRESSION, DISTAL CLAVICLE RESECTION AND MINI OPEN ROTATOR CUFF REPAIR, OPEN BICEP TENDODESIS;  Surgeon: Valeria Batman, MD;  Location:  SURGERY CENTER;  Service: Orthopedics;  Laterality:  Right;   SHOULDER CLOSED REDUCTION Right 09/08/2016   Procedure: RIGHT CLOSED MANIPULATION SHOULDER;  Surgeon: Valeria Batman, MD;  Location: [facility];  Service: Orthopedics;  Laterality: Right;   TUBAL LIGATION     UPPER GASTROINTESTINAL ENDOSCOPY  10/06/2020    Current Outpatient Medications on File Prior to Visit  Medication Sig Dispense Refill   albuterol (VENTOLIN HFA) 108 (90 Base) MCG/ACT inhaler Inhale 2 puffs into the lungs every 6 (six) hours as needed for wheezing or shortness of breath. 18 g 0   aspirin EC 81 MG tablet Take 1 tablet (81 mg total) by mouth daily. Swallow whole. 90 tablet 3   Atogepant (QULIPTA) 60 MG TABS Take 1 tablet (60 mg total) by mouth daily. (Patient taking differently: Take 1 tablet by mouth at bedtime.) 30 tablet 11   atorvastatin (LIPITOR) 20 MG tablet Take 1 tablet (20 mg total) by mouth daily. (Patient  taking differently: Take 20 mg by mouth at bedtime.) 90 tablet 3   butalbital-acetaminophen-caffeine (FIORICET) 50-325-40 MG tablet Take 1 tablet by mouth every 6 (six) hours as needed for headache. Do not refill in less than 30 day 30 tablet 5   Continuous Glucose Sensor (DEXCOM G7 SENSOR) MISC Apply 1 sensor every 10 days. 9 each 4   diazepam (VALIUM) 5 MG tablet Take 1 tablet (5 mg total) by mouth every 12 (twelve) hours as needed for anxiety. (Patient taking differently: Take 5 mg by mouth every 12 (twelve) hours as needed (migraines).) 30 tablet 5   dicyclomine (BENTYL) 20 MG tablet Take 1 tablet (20 mg total) by mouth every 6 (six) hours. (Patient taking differently: Take 20 mg by mouth 3 (three) times daily as needed.) 90 tablet 1   EPINEPHrine 0.3 mg/0.3 mL IJ SOAJ injection Inject 0.3 mg into the muscle as needed for anaphylaxis. 2 each 0   fluconazole (DIFLUCAN) 150 MG tablet Take 1 tablet (150 mg total) by mouth daily. 5 tablet 0   gabapentin (NEURONTIN) 100 MG capsule Take 1 capsule (100 mg total) by mouth 3 (three) times daily. 30 capsule 1   Ipratropium-Albuterol (COMBIVENT) 20-100 MCG/ACT AERS respimat Inhale 1 puff into the lungs every 6 (six) hours. (Patient taking differently: Inhale 1 puff into the lungs every 6 (six) hours as needed for shortness of breath.) 4 g 11   losartan-hydrochlorothiazide (HYZAAR) 50-12.5 MG tablet Take 1 tablet by mouth daily. 90 tablet 0   meloxicam (MOBIC) 15 MG tablet Take 1 tablet (15 mg total) by mouth daily. 14 tablet 0   metoprolol succinate (TOPROL-XL) 25 MG 24 hr tablet Take 0.5 tablets (12.5 mg total) by mouth daily. (Patient taking differently: Take 12.5 mg by mouth at bedtime.) 45 tablet 0   ondansetron (ZOFRAN-ODT) 4 MG disintegrating tablet Take 1 tablet (4 mg total) by mouth every 8 (eight) hours as needed for nausea or vomiting. 20 tablet 0   pregabalin (LYRICA) 75 MG capsule Take 1 capsule (75 mg total) by mouth 2 (two) times daily. 60  capsule 0   Semaglutide, 1 MG/DOSE, (OZEMPIC, 1 MG/DOSE,) 4 MG/3ML SOPN Inject 1 mg into the skin once a week as directed. 9 mL 1   Ubrogepant (UBRELVY) 100 MG TABS Take 1 tablet (100 mg total) by mouth as needed. May repeat dose in 2 hours if needed. Max dose 2 tablets in 2 hours 16 tablet 11   Vitamin D, Ergocalciferol, (DRISDOL) 1.25 MG (50000 UNIT) CAPS capsule Take 1 capsule by mouth once a  week. (Patient taking differently: Take 50,000 Units by mouth once a week. Sunday) 4 capsule 11   metFORMIN (GLUCOPHAGE-XR) 500 MG 24 hr tablet Take 2 tablets (1,000 mg total) by mouth 2 (two) times daily with a meal. 1000mg  at breakfast and 1000 mg at dinner (Patient taking differently: Take 1,000 mg by mouth 2 (two) times daily with a meal. 1 tablet once a day) 96 tablet 0   oxyCODONE (ROXICODONE) 5 MG immediate release tablet Take 1 tablet (5 mg total) by mouth every 4 (four) hours as needed for severe pain (pain score 7-10) or breakthrough pain. (Patient not taking: Reported on 08/01/2023) 15 tablet 0   No current facility-administered medications on file prior to visit.    Allergies  Allergen Reactions   Bamlanivimab Anaphylaxis, Itching, Palpitations, Rash, Shortness Of Breath and Swelling   Botox [Onabotulinumtoxina] Shortness Of Breath    syncope   Clindamycin/Lincomycin Other (See Comments)   Contrast Media [Iodinated Contrast Media] Shortness Of Breath and Palpitations   Kiwi Extract Shortness Of Breath   Maxalt [Rizatriptan Benzoate] Anaphylaxis    Chest pain   Nitrous Oxide Shortness Of Breath    syncope   Rizatriptan Benzoate Anaphylaxis    Chest pain   Triptans Shortness Of Breath   Vyepti [Eptinezumab-Jjmr] Shortness Of Breath, Palpitations and Other (See Comments)    Throat soreness, tachycardia   Aspirin Nausea And Vomiting    Mouth blisters   Haemophilus Influenzae Vaccines    Latex     Sometimes causes rash, welts   Mango Flavoring Agent (Non-Screening) Swelling    Lips  swell   Vicodin [Hydrocodone-Acetaminophen]     hallucinations   Erythromycin Rash   Flagyl [Metronidazole] Rash   Influenza Virus Vaccine Palpitations   Penicillins Nausea And Vomiting, Rash and Other (See Comments)    Did it involve swelling of the face/tongue/throat, SOB, or low BP? No Did it involve sudden or severe rash/hives, skin peeling, or any reaction on the inside of your mouth or nose? No Did you need to seek medical attention at a hospital or doctor's office? Yes When did it last happen?     patient was 53 years old  If all above answers are "NO", may proceed with cephalosporin use.   Tetracyclines & Related Nausea And Vomiting and Rash    Social History:  reports that she has never smoked. She has never used smokeless tobacco. She reports that she does not drink alcohol and does not use drugs.  Family History  Problem Relation Age of Onset   Diabetes Mother    Heart disease Mother 31   Hypertension Mother    Hyperlipidemia Mother    Heart failure Mother    Stroke Mother    Thyroid disease Mother    Depression Mother    Sleep apnea Mother    Obesity Mother    Colon polyps Mother    Cancer Father 105       Lung Cancer   Heart disease Father    Mental illness Sister        bipolar, substance abuse,  clean 2 yrs   Hypertension Sister    Cancer Paternal Grandmother 54       breast cancer   Breast cancer Paternal Grandmother    Diabetes Maternal Grandmother    Hypertension Maternal Grandmother    Diabetes Son    Colon cancer Neg Hx    Esophageal cancer Neg Hx    Rectal cancer Neg Hx  Stomach cancer Neg Hx     The following portions of the patient's history were reviewed and updated as appropriate: allergies, current medications, past family history, past medical history, past social history, past surgical history and problem list.  Review of Systems Pertinent items noted in HPI and remainder of comprehensive ROS otherwise negative.  Physical Exam:  BP  135/87   Pulse 73   Wt 201 lb 12.8 oz (91.5 kg)   BMI 32.57 kg/m  CONSTITUTIONAL: Well-developed, well-nourished female in no acute distress.  HENT:  Normocephalic, atraumatic, External right and left ear normal.  EYES: Conjunctivae and EOM are normal. Pupils are equal, round, and reactive to light. No scleral icterus.  NECK: Normal range of motion, supple, no masses.  Normal thyroid.  SKIN: Skin is warm and dry. No rash noted. Not diaphoretic. No erythema. No pallor. MUSCULOSKELETAL: Normal range of motion. No tenderness.  No cyanosis, clubbing, or edema.   NEUROLOGIC: Alert and oriented to person, place, and time. Normal reflexes, muscle tone coordination.  PSYCHIATRIC: Normal mood and affect. Normal behavior. Normal judgment and thought content. CARDIOVASCULAR: Normal heart rate noted, regular rhythm RESPIRATORY: Effort and breath sounds normal, no problems with respiration noted. BREASTS: Deferred. ABDOMEN: Soft, no distention noted.  No tenderness, rebound or guarding.  PELVIC: Normal appearing external genitalia and urethral meatus. Scant white discharge noted, sample obtained.  No adnexal tenderness or pain on bimanual exam.  Performed in the presence of a chaperone.   Assessment and Plan:      1. Routine screening for STI (sexually transmitted infection) Desires annual STI check. Labs drawn, will follow up results and manage accordingly. - Cervicovaginal ancillary only - RPR+HBsAg+HCVAb+HIV  2. Well woman exam with routine gynecological exam Normal exam today.   Up to date mammogram and colonoscopy. Routine preventative health maintenance measures emphasized. Please refer to After Visit Summary for other counseling recommendations.      Jaynie Collins, MD, FACOG Obstetrician & Gynecologist, [facility] for [employer], [facility]

## 2023-08-02 ENCOUNTER — Encounter: Payer: Commercial Managed Care - PPO | Admitting: Rehabilitative and Restorative Service Providers"

## 2023-08-02 ENCOUNTER — Encounter: Payer: Self-pay | Admitting: Obstetrics & Gynecology

## 2023-08-02 LAB — CERVICOVAGINAL ANCILLARY ONLY
Chlamydia: NEGATIVE
Comment: NEGATIVE
Comment: NEGATIVE
Comment: NORMAL
Neisseria Gonorrhea: NEGATIVE
Trichomonas: NEGATIVE

## 2023-08-03 ENCOUNTER — Encounter (HOSPITAL_BASED_OUTPATIENT_CLINIC_OR_DEPARTMENT_OTHER): Payer: Self-pay | Admitting: Physical Therapy

## 2023-08-03 ENCOUNTER — Ambulatory Visit (HOSPITAL_BASED_OUTPATIENT_CLINIC_OR_DEPARTMENT_OTHER): Payer: Commercial Managed Care - PPO | Admitting: Physical Therapy

## 2023-08-03 ENCOUNTER — Encounter: Payer: Self-pay | Admitting: Obstetrics & Gynecology

## 2023-08-03 DIAGNOSIS — G8929 Other chronic pain: Secondary | ICD-10-CM

## 2023-08-03 DIAGNOSIS — M25611 Stiffness of right shoulder, not elsewhere classified: Secondary | ICD-10-CM | POA: Diagnosis not present

## 2023-08-03 DIAGNOSIS — M25511 Pain in right shoulder: Secondary | ICD-10-CM

## 2023-08-03 DIAGNOSIS — Z9889 Other specified postprocedural states: Secondary | ICD-10-CM | POA: Diagnosis not present

## 2023-08-03 DIAGNOSIS — M542 Cervicalgia: Secondary | ICD-10-CM | POA: Diagnosis not present

## 2023-08-03 DIAGNOSIS — M6281 Muscle weakness (generalized): Secondary | ICD-10-CM

## 2023-08-03 DIAGNOSIS — S46011A Strain of muscle(s) and tendon(s) of the rotator cuff of right shoulder, initial encounter: Secondary | ICD-10-CM | POA: Diagnosis not present

## 2023-08-03 LAB — RPR+HBSAG+HCVAB+...
HIV Screen 4th Generation wRfx: NONREACTIVE
Hep C Virus Ab: NONREACTIVE
Hepatitis B Surface Ag: NEGATIVE
RPR Ser Ql: NONREACTIVE

## 2023-08-04 ENCOUNTER — Encounter (HOSPITAL_BASED_OUTPATIENT_CLINIC_OR_DEPARTMENT_OTHER): Payer: Self-pay | Admitting: Orthopaedic Surgery

## 2023-08-08 ENCOUNTER — Encounter (HOSPITAL_BASED_OUTPATIENT_CLINIC_OR_DEPARTMENT_OTHER): Payer: Commercial Managed Care - PPO

## 2023-08-08 ENCOUNTER — Encounter (HOSPITAL_BASED_OUTPATIENT_CLINIC_OR_DEPARTMENT_OTHER): Payer: Commercial Managed Care - PPO | Admitting: Physical Therapy

## 2023-08-08 NOTE — Therapy (Signed)
 OUTPATIENT OCCUPATIONAL THERAPY ORTHO EVALUATION  Patient Name: [name] MRN: [id number] DOB:01-May-1971, 53 y.o., female Today's Date: 08/09/2023  PCP: Duncan Dull, MD REFERRING PROVIDER: Samuella Cota, MD   END OF SESSION:  OT End of Session - 08/09/23 0849     Visit Number 1    Number of Visits 8    Date for OT Re-Evaluation 09/22/23    Authorization Type [facility]    OT Start Time 0848    OT Stop Time [phone]    OT Time Calculation (min) 49 min    Activity Tolerance Patient tolerated treatment well;No increased pain;Patient limited by fatigue;Patient limited by lethargy;Patient limited by pain    Behavior During Therapy [facility] for tasks assessed/performed             Past Medical History:  Diagnosis Date   Allergy    Anemia    Anxiety    claustrophobic   Asthma    Back pain    Biceps tendonosis of right shoulder    Cataract    Mixed OU   COVID-19    covid PNA hospitalized 2020, 06/06/21   Diabetes mellitus 2011   did not start metforfin, losing weight   Difficult intubation    tooth got chipped one time   Dyspnea    Essential hypertension    Fatty liver    Food allergy    Frozen shoulder    left, had steroid injection 10/05/21   Headache disorder    History of concussion    HLD (hyperlipidemia)    Hypertensive retinopathy    OU   IBS (irritable bowel syndrome)    Infertility, female    Joint pain    Lactose intolerance    Leg edema    Migraines    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   Palpitation    Post-menopausal    Seborrheic dermatitis    Shoulder impingement syndrome, right    Stage 3a chronic kidney disease (CKD) (HCC)    Type 2 diabetes mellitus with complication, with long-term current use of insulin (HCC) 2018   Uveitis    [place] [facility] in 2020 seen specialist in [place] [facility] last seen 11 or 05/2020   Vitamin D deficiency    Past Surgical History:  Procedure Laterality Date   ABDOMINAL HYSTERECTOMY  2006    heavy menses, endometriosis, l oophrectomy   BREAST BIOPSY Right 2018   benign   BREAST EXCISIONAL BIOPSY Right 2003   benign   BREAST EXCISIONAL BIOPSY Right 1999   benign   COLONOSCOPY  10/06/2020   INGUINAL HERNIA REPAIR Left 2003   LEFT OOPHORECTOMY  2006   LUMBAR FUSION  2005   L5-S1   SHOULDER ARTHROSCOPY WITH ROTATOR CUFF REPAIR AND SUBACROMIAL DECOMPRESSION  04/06/2023   Procedure: SHOULDER ARTHROSCOPY WITH ROTATOR CUFF REPAIR , EXTENSIVE DEBRIDEMENT, AND SUBACROMIAL DECOMPRESSION;  Surgeon: Huel Cote, MD;  Location: [facility];  Service: Orthopedics;;   SHOULDER ARTHROSCOPY WITH SUBACROMIAL DECOMPRESSION AND OPEN ROTATOR C Right 07/14/2016   Procedure: RIGHT SHOULDER ARTHROSCOPY WITH SUBACROMIAL DECOMPRESSION, DISTAL CLAVICLE RESECTION AND MINI OPEN ROTATOR CUFF REPAIR, OPEN BICEP TENDODESIS;  Surgeon: Valeria Batman, MD;  Location: [facility];  Service: Orthopedics;  Laterality: Right;   SHOULDER CLOSED REDUCTION Right 09/08/2016   Procedure: RIGHT CLOSED MANIPULATION SHOULDER;  Surgeon: Valeria Batman, MD;  Location: [facility];  Service: Orthopedics;  Laterality: Right;   TUBAL LIGATION  UPPER GASTROINTESTINAL ENDOSCOPY  10/06/2020   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 09/21/2021   Chronic left shoulder pain 09/21/2021   Fatty infiltration of liver 07/26/2020   Diarrhea, functional 06/22/2020   Uveitis, intermediate, bilateral 09/07/2019   Generalized edema 06/13/2019   Hospital discharge follow-up 06/07/2019   Multinodular goiter 12/05/2017   Microalbuminuria due to type 2 diabetes mellitus (HCC) 11/26/2017   Encounter for preventive health examination 11/26/2017   Menopause syndrome 06/10/2017   Adverse reaction to vaccine, sequela 03/25/2017   Atypical chest  pain 02/27/2017   PSVT (paroxysmal supraventricular tachycardia) (HCC) 11/26/2016   Nontraumatic incomplete tear of right rotator cuff 07/14/2016   AC (acromioclavicular) arthritis 07/14/2016   Impingement syndrome of right shoulder 07/14/2016   Fibrocystic breast changes, right 05/21/2016   Insomnia 10/16/2015   Snoring 07/26/2015   Chronic migraine w/o aura w/o status migrainosus, not intractable 02/24/2015   History of concussion 07/21/2014   Hyperlipidemia associated with type 2 diabetes mellitus (HCC) 06/13/2014   Vitamin D deficiency 05/07/2013   Chronic migraine 11/20/2012   S/P Total Abdominal Hysterectomy and Left Salpingo-oophorectomy 10/20/2011   Headache around the eyes    Essential hypertension 07/20/2011    ONSET DATE: Pain and paresthesia in the right distal extremity and hand after second shoulder surgery in October 2024  REFERRING DIAG: R20.0 (ICD-10-CM) - Right arm numbness   THERAPY DIAG:  Paresthesia of skin  Pain in right hand  Stiffness of right wrist, not elsewhere classified  Muscle weakness (generalized)  Rationale for Evaluation and Treatment: Rehabilitation  SUBJECTIVE:   SUBJECTIVE STATEMENT: She gives a long and detailed history of shoulder pain and problems dating back to 2018 when she first had a rotator cuff surgery.  She had RTC in 2018 with some complications, tried PRP, etc., she states nothing helped her pain and problems. In 2023-24 she did PT at [facility], states that did not help her shoulder either.  She states history of having "frozen shoulder" multiple times in bilateral shoulders.  She states seeing Dr. Steward Drone at [facility] and in October 2024, having a second rotator cuff surgery/repair to the right shoulder.  She states "once the nerve block wore off I realized my hand and arm felt numb, movement was difficult, I started having problems with shooting pain in my right hand and arm.  It is also very weak since then."  She met with Dr.  Denese Killings who reviewed with her that she seems to have some entrapment of the ulnar nerve and median nerve going to her right dominant hand, based on her symptoms, and referred here to hand therapy/Occupational Therapy.  During the evaluation she states working from home, lying in bed to work, type on her computer because "she was told to keep her elbow straight."  She states digits 3 through 5 are the worst numbness and pain and occasionally in digits 1 and 2 for the right hand only.  She states dropping things, spilling things, being weak.  She states her shoulder is just as bad as it was before any surgeries or procedures that have been done to her so far.  She states some "hypersensitivity" in the dorsum of her hand.  After having her grip strength checked, she states "it is strange if I have 30 pounds of grip because it is difficult for me  to squeeze the trigger of my gun which is about 7 pounds."  OT asks her if she has been to a shooting range recently, using her firearm, and she states that she has not and the doctor recommended against it, however her complaints of weakness and pain in her hand only has started after her second surgery.  The story that she relates to the occupational therapist does seem to change and be inconsistent at times today.  She denies any pain or problems with the left side upper extremity.   PERTINENT HISTORY: Right wrist carpal tunnel syndrome Right cubital tunnel syndrome  PRECAUTIONS: None;  RED FLAGS:  None   WEIGHT BEARING RESTRICTIONS: No  PAIN:  Are you having pain? Yes: NPRS scale: 3-4/10 tingling/numb now at rest, rates "pain" 1-2/10 "pricking" in fingertips now at rest; at worst she states numbness/pain goes up to 7-8/10 Pain location: Right hand digits 3 through 5 mostly, hypersensitivity in the dorsum of the hand and occasional numbness in digits 1 and 2. Pain description: Shooting, burning, nerve pain Aggravating factors: Movement at the shoulder,  weightbearing, etc. Relieving factors: Unknown  FALLS: Has patient fallen in last 6 months? No  LIVING ENVIRONMENT: Lives with: lives alone Lives in: House/apartment Has following equipment at home: None  PLOF: Independent  PATIENT GOALS: Have less pain and paresthesia in the right hand  NEXT MD VISIT: 10/16/2023 (with Dr. Denese Killings)   OBJECTIVE: (All objective assessments below are from initial evaluation on: 08/09/23 unless otherwise specified.)   HAND DOMINANCE: Right   ADLs: Overall ADLs: States decreased ability to grab, hold household objects, pain and difficulty to open containers, perform FMS tasks (manipulate fasteners on clothing), mild to moderate bathing problems as well.    FUNCTIONAL OUTCOME MEASURES: Eval: Patient Specific Functional Scale: 3 (hair care, IADLS, driving)  (Higher Score  =  Better Ability for the Selected Tasks)      UPPER EXTREMITY ROM     Shoulder to Wrist AROM Right eval  Shoulder flexion   Shoulder abduction   Shoulder extension   Shoulder internal rotation   Shoulder external rotation   Elbow flexion 121  Elbow extension (-10)   Forearm supination 75  Forearm pronation  90  Wrist flexion 35  Wrist extension 74  (Blank rows = not tested)   Hand AROM Right eval  Full Fist Ability (or Gap to Distal Palmar Crease) full  Thumb Opposition  (Kapandji Scale)  8/10  (Blank rows = not tested)   UPPER EXTREMITY MMT:     MMT Right Evaluation Left Evaluation  Shoulder flexion    Shoulder abduction    Shoulder adduction    Shoulder extension    Shoulder internal rotation    Shoulder external rotation    Middle trapezius    Lower trapezius    Elbow flexion    Elbow extension    Forearm supination 4+/5 5/5  Forearm pronation 4+/5 5/5  Wrist flexion 4-/5 5/5  Wrist extension 4+/5 5/5  (Blank rows = not tested)  HAND FUNCTION: Eval: Observed weakness in affected Rt hand.  Grip strength Right: 33 lbs, Left: 77 lbs   Rapid  grip exchange: Rt: TBD Lt: TBD  COORDINATION: Eval: No coordination deficits for Rt hand FMS, but GMS at the shoulder are definitely off.  9 Hole Peg Test Right: 24sec, Left: 22 sec (26 sec is WFL)   SENSATION: Eval:  Light touch intact today, though complaints of paresthesia and burning shooting pains at times in  the right hand going up the arm.  2PDT:   Lt IF, SF both 4mm (WNL)  Rt IF: 5mm; thumb: inconsistent answers between 81mm-9mm; SF: 5mm (4-89mm is within functional limits for sensation testing)  EDEMA:   Eval:  none   COGNITION: Eval: Overall cognitive status: WFL for evaluation today   OBSERVATIONS:   Eval: Today she tested within normal limits for right hand coordination, tested within normal limits for sensation for the most part (4-75mm S2PD Test typically has 4-19mm discrimination ability), had no swelling or edema, no apparent muscle wasting or signs of chronic nerve entrapment, and good strength in the forearm, wrist despite showing very exaggerated and slow motion during active range of motion measurements.  At times she is seen moving very slowly in the hand wrist and arm and at other times seems to move more casually.   TODAY'S TREATMENT:  Post-evaluation treatment:   For her safety/self-care she is given information on neuromuscular reeducation including nerve entrapment, nerve anatomy, preventing nerve entrapment at the shoulder to hand, and this information is included in the patient instruction section.  As she states lying in bed to do her job for significant portion of the day, OT recommends highly against this as her elbow is likely pressing into the bed increasing nerve impingement.  She was shown several ways to posture that should not cause nerve entrapment, and she does state having a desk that can be moved for sitting and standing.  She is recommended to change her positions throughout the day.  She was also given the following home exercise program to perform  at least 4 times a day, without any pain, keeping nerve gliding to a light tension.  These were explained to her, demonstrated for her and she demonstrates back with OT cues and supervision to keep pain and tingling minimal but still feel appropriate tension.   Exercises - Seated Scapular Retraction  - 4-6 x daily - 5-10 reps - Reach arms upward   - 4 x daily - 10 reps - Standing Elbow Flexion Extension AROM  - 4-6 x daily - 10-15 reps - Forearm Supination Stretch  - 3-4 x daily - 3-5 reps - 15 sec hold - Wrist Flexion Stretch  - 4 x daily - 3-5 reps - 15 sec hold - Wrist Extension Stretch Pronated  - 4 x daily - 3-5 reps - 15 hold - Median Nerve Flossing  - 2-3 x daily - 5-10 reps - Ulnar Nerve/Median Glide- Low Level  - 4-6 x daily - 5-10 reps   PATIENT EDUCATION: Education details: See tx section above for details  Person educated: Patient Education method: Verbal Instruction, Teach back, Handouts  Education comprehension: States and demonstrates understanding, Additional Education required    HOME EXERCISE PROGRAM: Access Code: [email or web address] URL: [email or web address] Date: 08/09/2023 Prepared by: Fannie Knee   GOALS: Goals reviewed with patient? Yes   SHORT TERM GOALS: (STG required if POC>30 days) Target Date: 08/25/2023  Pt will obtain protective, custom orthotic. Goal status: TBD/PRN  2.  Pt will demo/state understanding of initial HEP to improve pain levels and prerequisite motion. Goal status: INITIAL   LONG TERM GOALS: Target Date: 09/22/23  Pt will improve functional ability by decreased impairment per PSFS assessment from 3 to 6 or better, for better quality of life. Goal status: INITIAL  2.  Pt will improve grip strength in right hand from 33 lbs to at least 50 lbs for functional use at home and in IADLs. Goal  status: INITIAL  3.  Pt will improve A/ROM in right wrist flexion from 35 degrees to at least 60 degrees, to have functional motion  for tasks like reach and grasp.  Goal status: INITIAL  4.  Pt will improve strength in right wrist flexion from 4 -/5 MMT to at least 4+/5 MMT to have increased functional ability to carry out selfcare and higher-level homecare tasks with less difficulty. Goal status: INITIAL  5.  Pt will decrease pain/paresthesia complaints at worst from 7-8/10 to 4/10 or better to have better sleep and occupational participation in daily roles. Goal status: INITIAL   ASSESSMENT:  CLINICAL IMPRESSION: Patient is a 53y.o. female who was seen today for occupational therapy evaluation for complaints of pain and paresthesia in the right hand that shoots up into her forearm into her shoulder.  She states this causes her to have poor coordination, weakness, decreased functional ability and it has limited her shoulder rehabilitation-which she states has been completely unsuccessful in terms of reducing pain and symptoms.  She also states all former treatments to the shoulder have been completely unsuccessful to help her symptoms.  Today she gives some inconsistencies in her subjective reports, and, per testing and evaluation, has fairly good coordination, sensation, strength in the distal arm.  She also seems to have some habits and routines that could contribute toward nerve entrapment (like lying in bed to type and do her work) which we will work on changing to more healthy routines and habits.  OT is somewhat concerned that her stated problems in her shoulder and apparent shoulder stiffness and debility could be a major site of nerve entrapment, and she states not tolerating shoulder rehabilitation.  This could be a major issue with this therapy as well.  All of these factors above present challenges for successful therapy program.  We will try our best and do therapy treatment that she can tolerate.  She will benefit from outpatient occupational therapy to try to help her change habits and routines, be more body aware and  decrease nerve entrapment, and to improve ability with the right arm.  If she can adhere to recommendations and improve stiffness/tightness in her arm, there should be no reason why her paresthesia lingers other than if she had severe damage or nerve entrapment proximally in the shoulder area that she cannot tolerate treatment for.  PERFORMANCE DEFICITS: in functional skills including ADLs, IADLs, coordination, sensation, ROM, strength, pain, fascial restrictions, flexibility, Gross motor control, body mechanics, endurance, decreased knowledge of precautions, and UE functional use, cognitive skills including safety awareness, and psychosocial skills including coping strategies, environmental adaptation, habits, and routines and behaviors.   IMPAIRMENTS: are limiting patient from ADLs, IADLs, rest and sleep, work, and leisure.   COMORBIDITIES: has co-morbidities such as diabetes, chronic shoulder pain and problems, back pain, hypertension, anxiety, asthma,Stage III chronic kidney disease, and others  that affects occupational performance. Patient will benefit from skilled OT to address above impairments and improve overall function.  MODIFICATION OR ASSISTANCE TO COMPLETE EVALUATION: Min-Moderate modification of tasks or assist with assess necessary to complete an evaluation.  OT OCCUPATIONAL PROFILE AND HISTORY: Comprehensive assessment: Review of records and extensive additional review of physical, cognitive, psychosocial history related to current functional performance.  CLINICAL DECISION MAKING: Moderate - several treatment options, min-mod task modification necessary  REHAB POTENTIAL: Good  EVALUATION COMPLEXITY: Moderate      PLAN:  OT FREQUENCY: 1x/week  OT DURATION: 6 weeks through 09/22/2023 and up to 8 total  visits as needed  PLANNED INTERVENTIONS: 97168 OT Re-evaluation, 97535 self care/ADL training, 40981 therapeutic exercise, 97530 therapeutic activity, 97112 neuromuscular  re-education, 97140 manual therapy, 97039 fluidotherapy, 97010 moist heat, 97010 cryotherapy, 97760 Orthotics management and training, 19147 Splinting (initial encounter), [phone] Subsequent splinting/medication, Dry needling, coping strategies training, patient/family education, and DME and/or AE instructions  RECOMMENDED OTHER SERVICES: She is in physical therapy now for her shoulder though she has not been attended due to poor progress and complications with nerve complaints  CONSULTED AND AGREED WITH PLAN OF CARE: Patient  PLAN FOR NEXT SESSION:   Try rapid exchange grip testing if able and time allows, review home exercise program and initial recommendations, try manual therapy modalities as helpful   Fannie Knee, OTR/L, CHT 08/09/2023, 12:37 PM

## 2023-08-09 ENCOUNTER — Ambulatory Visit: Payer: Commercial Managed Care - PPO | Admitting: Rehabilitative and Restorative Service Providers"

## 2023-08-09 ENCOUNTER — Encounter: Payer: Self-pay | Admitting: Rehabilitative and Restorative Service Providers"

## 2023-08-09 ENCOUNTER — Encounter: Payer: Self-pay | Admitting: Internal Medicine

## 2023-08-09 ENCOUNTER — Telehealth: Payer: Self-pay | Admitting: Rehabilitative and Restorative Service Providers"

## 2023-08-09 DIAGNOSIS — M6281 Muscle weakness (generalized): Secondary | ICD-10-CM

## 2023-08-09 DIAGNOSIS — M79641 Pain in right hand: Secondary | ICD-10-CM

## 2023-08-09 DIAGNOSIS — R202 Paresthesia of skin: Secondary | ICD-10-CM | POA: Diagnosis not present

## 2023-08-09 DIAGNOSIS — M25631 Stiffness of right wrist, not elsewhere classified: Secondary | ICD-10-CM

## 2023-08-09 NOTE — Therapy (Signed)
 OUTPATIENT PHYSICAL THERAPY SHOULDER TREATMENT   Patient Name: [name] MRN: [id number] DOB:1971/04/17, 53 y.o., female Today's Date: 08/10/2023  END OF SESSION:  PT End of Session - 08/10/23 1150     Visit Number 12    Number of Visits 24    Date for PT Re-Evaluation 10/17/23    Authorization Type Lanetta Inch 2024    Authorization Time Period 25 PT/OT    Authorization - Visit Number 13    Authorization - Number of Visits 20    PT Start Time 1150   late check in   PT Stop Time 1231    PT Time Calculation (min) 41 min    Activity Tolerance Patient tolerated treatment well               Past Medical History:  Diagnosis Date   Allergy    Anemia    Anxiety    claustrophobic   Asthma    Back pain    Biceps tendonosis of right shoulder    Cataract    Mixed OU   COVID-19    covid PNA hospitalized 2020, 06/06/21   Diabetes mellitus 2011   did not start metforfin, losing weight   Difficult intubation    tooth got chipped one time   Dyspnea    Essential hypertension    Fatty liver    Food allergy    Frozen shoulder    left, had steroid injection 10/05/21   Headache disorder    History of concussion    HLD (hyperlipidemia)    Hypertensive retinopathy    OU   IBS (irritable bowel syndrome)    Infertility, female    Joint pain    Lactose intolerance    Leg edema    Migraines    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   Palpitation    Post-menopausal    Seborrheic dermatitis    Shoulder impingement syndrome, right    Stage 3a chronic kidney disease (CKD) (HCC)    Type 2 diabetes mellitus with complication, with long-term current use of insulin (HCC) 2018   Uveitis    [facility] est in 2020 seen specialist in [place] [facility] last seen 11 or 05/2020   Vitamin D deficiency    Past Surgical History:  Procedure Laterality Date   ABDOMINAL HYSTERECTOMY  2006   heavy menses, endometriosis, l oophrectomy   BREAST BIOPSY Right 2018   benign    BREAST EXCISIONAL BIOPSY Right 2003   benign   BREAST EXCISIONAL BIOPSY Right 1999   benign   COLONOSCOPY  10/06/2020   INGUINAL HERNIA REPAIR Left 2003   LEFT OOPHORECTOMY  2006   LUMBAR FUSION  2005   L5-S1   SHOULDER ARTHROSCOPY WITH ROTATOR CUFF REPAIR AND SUBACROMIAL DECOMPRESSION  04/06/2023   Procedure: SHOULDER ARTHROSCOPY WITH ROTATOR CUFF REPAIR , EXTENSIVE DEBRIDEMENT, AND SUBACROMIAL DECOMPRESSION;  Surgeon: Huel Cote, MD;  Location: [facility];  Service: Orthopedics;;   SHOULDER ARTHROSCOPY WITH SUBACROMIAL DECOMPRESSION AND OPEN ROTATOR C Right 07/14/2016   Procedure: RIGHT SHOULDER ARTHROSCOPY WITH SUBACROMIAL DECOMPRESSION, DISTAL CLAVICLE RESECTION AND MINI OPEN ROTATOR CUFF REPAIR, OPEN BICEP TENDODESIS;  Surgeon: Valeria Batman, MD;  Location: [facility];  Service: Orthopedics;  Laterality: Right;   SHOULDER CLOSED REDUCTION Right 09/08/2016   Procedure: RIGHT CLOSED MANIPULATION SHOULDER;  Surgeon: Valeria Batman, MD;  Location:  SURGERY CENTER;  Service: Orthopedics;  Laterality: Right;   TUBAL LIGATION  UPPER GASTROINTESTINAL ENDOSCOPY  10/06/2020   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 09/21/2021   Chronic left shoulder pain 09/21/2021   Fatty infiltration of liver 07/26/2020   Diarrhea, functional 06/22/2020   Uveitis, intermediate, bilateral 09/07/2019   Generalized edema 06/13/2019   Hospital discharge follow-up 06/07/2019   Multinodular goiter 12/05/2017   Microalbuminuria due to type 2 diabetes mellitus (HCC) 11/26/2017   Encounter for preventive health examination 11/26/2017   Menopause syndrome 06/10/2017   Adverse reaction to vaccine, sequela 03/25/2017   Atypical chest pain 02/27/2017   PSVT (paroxysmal supraventricular tachycardia) (HCC) 11/26/2016    Nontraumatic incomplete tear of right rotator cuff 07/14/2016   AC (acromioclavicular) arthritis 07/14/2016   Impingement syndrome of right shoulder 07/14/2016   Fibrocystic breast changes, right 05/21/2016   Insomnia 10/16/2015   Snoring 07/26/2015   Chronic migraine w/o aura w/o status migrainosus, not intractable 02/24/2015   History of concussion 07/21/2014   Hyperlipidemia associated with type 2 diabetes mellitus (HCC) 06/13/2014   Vitamin D deficiency 05/07/2013   Chronic migraine 11/20/2012   S/P Total Abdominal Hysterectomy and Left Salpingo-oophorectomy 10/20/2011   Headache around the eyes    Essential hypertension 07/20/2011    PCP: Dr. Duncan Dull   REFERRING PROVIDER: Dr. Huel Cote   REFERRING DIAG: Right Rotator Cuff Repair   THERAPY DIAG:  Acute pain of right shoulder  Muscle weakness (generalized)  Rationale for Evaluation and Treatment: Rehabilitation  ONSET DATE: 04/06/23  SUBJECTIVE:                                                                                                                                                                                      SUBJECTIVE STATEMENT:    08/10/2023 Pt describes PT as "slow" so far, dealing with multiple bouts with her nerve pain. She reports a lot of pain is "prickly" and occurs in the mornings, as day goes on it begins to travel proximally. She states the nerve glides OT gave her seem helpful, just started yesterday. Pt does mention she tends to feel a bit worse after PT sessions at times.     Eval: Pt is here for continuation of PT for the shoulder post op but has severe R UE nerve pain. Pt notes that NCV study in the chart is inaccurate. Done in January of 2025 by Dr. Alvester Morin. Pt did get an OT referral for the cubital tunnel and carpal tunnel. Pt does WFH at a laptop for occupation. Last 3 digits will  have NT. Pt does note dropping items and hand weakness. Turning the neck will cause R UE pain. Pt  must turn trunk in order to look over shoulder when driving. Pt notes that NT the Sunday after surgery. Pt has a history of some NT prior and had NCV in 2019 as well.     PERTINENT HISTORY:  Previous RC repair on R side  PAIN:  Are you having pain? Yes: NPRS scale: 3/10 Pain location: ant R shoulder and radiates down into last 3 digits Pain description: Achy and sharp, tingling,shooting  Aggravating factors: Driving Relieving factors: Gabapentin   PRECAUTIONS: RC repair  RED FLAGS: None   WEIGHT BEARING RESTRICTIONS: No  FALLS:  Has patient fallen in last 6 months? No  LIVING ENVIRONMENT:  Lives with family- college aged daughter Stairs No AD usage    OCCUPATION: Arts development officer   PLOF: Independent  PATIENT GOALS: Return to using right shoulder for functional activities like lifting objects and reaching overhead    OBJECTIVE:  Note: Objective measures were completed at Evaluation unless otherwise noted.   DIAGNOSTIC FINDINGS:  Discs: Disc spaces are maintained.   C2-3: No significant disc bulge. No neural foraminal stenosis. No central canal stenosis.   C3-4: No disc protrusion. Mild bilateral foraminal stenosis. No central canal stenosis.   C4-5: No disc protrusion. No right foraminal stenosis. Mild left foraminal stenosis. No central canal stenosis.   C5-6: Small central disc protrusion. No foraminal or central canal stenosis.   C6-7: Minimal disc bulge. Mild bilateral foraminal narrowing. No central canal stenosis.   C7-T1: No disc protrusion. Mild bilateral foraminal stenosis. No central canal stenosis.   IMPRESSION: 1. Mild cervical spine spondylosis as described above. 2. No acute osseous injury of the cervical spine.    PATIENT SURVEYS:  FOTO 40 with target of 60   COGNITION: Overall cognitive status: Within functional limits for tasks assessed     SENSATION: Light touch: Impaired  numbness and tingling down shoulder into  right palm   POSTURE: Rounded shoulders; R UE guarding position with elbow flexion  CERVICAL ROM: painful into R side of neck and   Active ROM A/PROM (deg) eval  Flexion 50% R deviation  Extension 20% R deviation  Right lateral flexion 0%  Left lateral flexion 0%  Right rotation 0%  Left rotation 0%   (Blank rows = not tested)   UPPER EXTREMITY ROM:   Active/ ROM Right eval  Shoulder flexion 80  Shoulder extension   Shoulder abduction 90  Shoulder adduction   Shoulder internal rotation To belly  Shoulder external rotation 30  Elbow flexion 125  Elbow extension -20  Wrist flexion 20  Wrist extension 50  Wrist ulnar deviation   Wrist radial deviation   Wrist pronation   Wrist supination   (Blank rows = not tested)   UPPER EXTREMITY MMT:  MMT Right eval  Shoulder flexion 3-  Shoulder extension   Shoulder abduction 3-   Shoulder adduction   Shoulder internal rotation 4  Shoulder external rotation 3-  Middle trapezius   Lower trapezius   Elbow flexion   Elbow extension   Wrist flexion   Wrist extension   Wrist ulnar deviation   Wrist radial deviation   Wrist pronation   Wrist supination   Grip strength (lbs) For next visit  (Blank rows = not tested)  Special Tests:  Reflexes  1+ C7 2+ C6   (+) Distraction (-) Spurling (painful regardless of over pressure  or position) (-) ER Lag  C4-T1 myotomal weakness on R    PALPATION:  Hypersensitivity and TTP of R UT, levator, pec minor, biceps, deltoid, supra   TODAY'S TREATMENT:                                                                                                                                         [facility] Adult PT Treatment:                                                DATE: 08/10/23 Therapeutic Exercise: Seated table slides flexion AAROM 3x5 cues for comfortable ROM Seated cervical retraction x8 cues for reduced UT compensations Seated scapular retraction 2x8 cues for reduced UT  compensations, arms crossed for improved comfort w/ arm positioning Seated table slides abduction AAROM 2x5 cues for comfortable ROM HEP handout + education  Self Care: Time spent w/ education on gentle shoulder movements to mitigate stiffness/guarding, plan with PT going forward (encouraged pt to reach out to insurance to confirm PT/OT visit limit), desensitization strategies, appropriate use of heating pad for symptom modification, positioning    DATE:  08/03/2023: - PROM R shoulder (gentle) - PROM to elbow (gentle)  - Discussed plan going forward with OT/PT, and other MD appts.  - Therapeutic putty squeezes.  - Towel squeezes/ twists.  - Discussed desensitization to R shoulder with different materials and textures to help desensitize shoulder area.   2/18: -PROM R shoulder (gentle) -Gentle GHJ distraction -Light STM to distal triceps  -Pendulums -discussion of posture/positioning     PATIENT EDUCATION: Education details: MOI, diagnosis, prognosis, anatomy, exercise progression, DOMS expectations, acceptable pain,   envelope of function, HEP, POC Person educated: Patient Education method: Explanation, Demonstration, Verbal cues, and Handouts Education comprehension: verbalized understanding, returned demonstration, and verbal cues required  HOME EXERCISE PROGRAM: Access Code: [email or web address] URL: [email or web address] Date: 08/10/2023 Prepared by: Fransisco Hertz  Exercises - Standing Scapular Retraction  - 3-4 x daily - 1 sets - 10 reps - Seated Shoulder Flexion Towel Slide at Table Top  - 3-4 x daily - 1 sets - 5 reps - Seated Shoulder Abduction Towel Slide at Table Top  - 3-4 x daily - 1 sets - 5 reps  ASSESSMENT:  CLINICAL IMPRESSION: Pt arrives w/ continued RUE pain. Today we trial AAROM given pt report of tough time tolerating PT-assisted PROM - with cues for setup/ROM, she does well with this. Continues with some pain but does report reduced intensity of  "prickly" sensation afterwards. With periscapular/cervical work she does endorse some symptom irritability, although tolerance to scap retractions improves with cues for increased LT vs UT contribution to movement. HEP is updated as above to omit exercises pt describes as consistently irritable, and omitting nerve glides as  pt states she has been doing OT versions with good response thus far. We add shoulder AAROM to HEP as pt describes this as modestly relieving in clinic today. No adverse events. Per discussion with pt and review of prior PT notes, may consider placing PT on temporary hold and instead focusing on OT - she states she would like to confirm PT/OT visit limits with insurance before making this decision, will follow up next session. Pt departs today's session in no acute distress, all voiced questions/concerns addressed appropriately from PT perspective.     Eval: Patient is a 53 y.o. female who was seen today for physical therapy evaluation and treatment s/p RC repair on 10/31. Therapy paused previously due to R sided radicular pain. Pt's s/s appear consistent with potential R cervical radiculopathy. A component of TOS is also on differential diagnosis list. Pt does have fatiguing weakness through C4-T1 myotomes as well as history of cubital and carpal tunnel syndromes. Pt's cervical ROM is highly limited despite normal appear MRI report. Pt's pain is extremely sensitive and irritable with movement. Pt's R UE and neck muscle spasm and guarding are limiting factors to her R shoulder rotator cuff rehab program. Plan to prioritize reduction of R upper quarter pain and muscle spasm at future sessions and introduce R shoulder strengthening and ROM as tolerated. Pt also to have OT consult at a future visit. Pt would benefit from continued skilled therapy in order to reach goals and maximize functional R UE strength and ROM for full return to PLOF. [name]     OBJECTIVE IMPAIRMENTS: decreased ROM,  decreased strength, impaired sensation, impaired UE functional use, and pain.   ACTIVITY LIMITATIONS: carrying, lifting, reach over head, and caring for others  PARTICIPATION LIMITATIONS: meal prep, cleaning, laundry, driving, shopping, community activity, and occupation  PERSONAL FACTORS: Fitness, Past/current experiences, Time since onset of injury/illness/exacerbation, and 1-2 comorbidities:    are also affecting patient's functional outcome.   REHAB POTENTIAL: Fair  CLINICAL DECISION MAKING: unstable/complicated  EVALUATION COMPLEXITY: moderate   GOALS: Goals reviewed with patient? No  SHORT TERM GOALS: Target date: 04/25/2023  PT reviewed the following HEP with patient with patient able to demonstrate a set of the following with min cuing for correction needed. PT educated patient on parameters of therex (how/when to inc/decrease intensity, frequency, rep/set range, stretch hold time, and purpose of therex) with verbalized understanding.  Baseline: NT 04/17/23: Performing independently  Goal status: ONGOING   2.  Patient will demonstrate right shoulder PROM flexion to >=100 degrees and abduction to >=90 degrees as evidence of tissue healing.  Baseline: Shoulder Flex R/L NT/180, Shoulder Abd R/L NT/180  Goal status: ONGOING      LONG TERM GOALS: Target date: 10/17/2023   Patient will have improved function and activity level as evidenced by an increase in FOTO score by 10 points or more.  Baseline: 40  Goal status: ONGOING   2.  Patient will improve right shoulder AROM to be symmetrical with left shoulder AROM for improved RUE function to perform reaching tasks like grabbing objects off a shelf.  Baseline: Shoulder Flex R/L NT/180, Shoulder Abd R/L NT/180  Goal status: ONGOING   3.  Patient will improve right shoulder strength to  Baseline:  Shoulder Flex R/L NT/5, Shoulder Abd R/L NT/5, Shoulder ER R/L NT/NT, Shoulder IR R/L NT/NT  Goal status: ONGOING     PLAN:  PT FREQUENCY: 1-2x/week  PT DURATION: 12 wks  PLANNED INTERVENTIONS: 97164- PT Re-evaluation, 97110-Therapeutic exercises, 97530-  Therapeutic activity, [id number]- Neuromuscular re-education, [phone]- Self Care, 60454- Manual therapy, 97014- Electrical stimulation (unattended), [phone]- Electrical stimulation (manual), Patient/Family education, Taping, Dry Needling, Joint mobilization, Joint manipulation, Spinal manipulation, Spinal mobilization, Scar mobilization, DME instructions, Cryotherapy, and Moist heat  PLAN FOR NEXT SESSION: cervical retraction with or without manual traction; review nerve gliding, R UE scapular depression, R scapular retraction, cervical ROM focus, pain management in cervical and R upper quarter as pain is limiting RC progression   Ashley Murrain PT, DPT 08/10/2023 12:43 PM

## 2023-08-09 NOTE — Patient Instructions (Signed)
 Management of hand/arm numbness or "paresthesia"  Peripheral nerves do heal and regenerate- IF you are good to them and change habits/routines.   How you will be successful:  Change habits/routines, prevent "kinking the hose" and any numbness Exercise program to "unstick" nerves, loosen tightness in body Protect with supportive bracing as helpful  Avoid:  Repetitive or BAD prolonged postures Avoid "kinks" at neck, shoulder, elbow, forearm and wrist!! Generally, keep hands DOWN at night. Shoulders above head = bad Try to keep elbows at 90* or straighter  Don't bend or extend wrists extremely  Have pillow support for head/neck in neutral position  Watch resting with palm down and wrist flexed ALL THE TIME Things that hurt or cause numbness Direct pressure on "nerve points" For example: sleeping on hands, pressure while typing, resting wrist on steering wheel, resting elbow on center console, etc.   Specifics:  Change your sleep habits! Start every day with a non-painful stretch routine and gentle nerve "gliding" assigned by your therapist.  Stretch again, twice that day.   It's smart to gently warmup your body and stretch before doing typically stressful activities (or before bed if your hands hurt/go numb in the night).  If you must do repetitive or stressful activities, or can't break your sleep habits, try some type of support (wrist braces in night, etc.). Using an extra pillow and putting your hand in the pillowcase could help, too. Don't hold your phone to long! Using handles with padding, padded gloves can help with compression or vibration. Take breaks for rest and recovery; don't "force" yourself to finish a job.   Keep hands covered and warm in the winter or cool, rainy weather.

## 2023-08-09 NOTE — Telephone Encounter (Signed)
 Called her at her request.  She stated concerns over OT evaluation and documentation, stating she felt like the evaluation was somewhat "hostile."  We discussed this and OT clarified that the goal of evaluation is to help address all areas of concern and analyze habits, routines, complaints, etc., to help identify the best way to help meet the patient's goals and identify anything that could be a barrier to her treatment or health. What was documented was what was directly stated by the patient, or the direct results/interpretation of the tests that were performed. She states wanting to improve her shooting pains and paresthesias, and we discuss that treatment is and will be focused on helping her meet her goals. There seemed to be a mutual agreement about that, and she thanked the therapist for calling her back.

## 2023-08-10 ENCOUNTER — Encounter (HOSPITAL_BASED_OUTPATIENT_CLINIC_OR_DEPARTMENT_OTHER): Payer: Self-pay | Admitting: Physical Therapy

## 2023-08-10 ENCOUNTER — Encounter (HOSPITAL_BASED_OUTPATIENT_CLINIC_OR_DEPARTMENT_OTHER): Payer: Commercial Managed Care - PPO

## 2023-08-10 ENCOUNTER — Ambulatory Visit (HOSPITAL_BASED_OUTPATIENT_CLINIC_OR_DEPARTMENT_OTHER): Payer: Commercial Managed Care - PPO | Attending: Orthopaedic Surgery | Admitting: Physical Therapy

## 2023-08-10 DIAGNOSIS — M542 Cervicalgia: Secondary | ICD-10-CM | POA: Insufficient documentation

## 2023-08-10 DIAGNOSIS — M6281 Muscle weakness (generalized): Secondary | ICD-10-CM | POA: Diagnosis not present

## 2023-08-10 DIAGNOSIS — M25511 Pain in right shoulder: Secondary | ICD-10-CM | POA: Diagnosis not present

## 2023-08-11 NOTE — Therapy (Signed)
 OUTPATIENT OCCUPATIONAL THERAPY TREATMENT NOTE   Patient Name: [name] MRN: [id number] DOB:21-Jun-1970, 53 y.o., female Today's Date: 08/14/2023  PCP: Duncan Dull, MD REFERRING PROVIDER: Samuella Cota, MD   END OF SESSION:  OT End of Session - 08/14/23 1435     Visit Number 2    Number of Visits 8    Date for OT Re-Evaluation 09/22/23    Authorization Type [facility]    OT Start Time 1435    OT Stop Time 1513    OT Time Calculation (min) 38 min    Activity Tolerance Patient tolerated treatment well;No increased pain;Patient limited by fatigue;Patient limited by lethargy;Patient limited by pain    Behavior During Therapy [facility] for tasks assessed/performed              Past Medical History:  Diagnosis Date   Allergy    Anemia    Anxiety    claustrophobic   Asthma    Back pain    Biceps tendonosis of right shoulder    Cataract    Mixed OU   COVID-19    covid PNA hospitalized 2020, 06/06/21   Diabetes mellitus 2011   did not start metforfin, losing weight   Difficult intubation    tooth got chipped one time   Dyspnea    Essential hypertension    Fatty liver    Food allergy    Frozen shoulder    left, had steroid injection 10/05/21   Headache disorder    History of concussion    HLD (hyperlipidemia)    Hypertensive retinopathy    OU   IBS (irritable bowel syndrome)    Infertility, female    Joint pain    Lactose intolerance    Leg edema    Migraines    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   Palpitation    Post-menopausal    Seborrheic dermatitis    Shoulder impingement syndrome, right    Stage 3a chronic kidney disease (CKD) (HCC)    Type 2 diabetes mellitus with complication, with long-term current use of insulin (HCC) 2018   Uveitis    [facility] est in 2020 seen specialist in [place] [facility] last seen 11 or 05/2020   Vitamin D deficiency    Past Surgical History:  Procedure Laterality Date   ABDOMINAL HYSTERECTOMY   2006   heavy menses, endometriosis, l oophrectomy   BREAST BIOPSY Right 2018   benign   BREAST EXCISIONAL BIOPSY Right 2003   benign   BREAST EXCISIONAL BIOPSY Right 1999   benign   COLONOSCOPY  10/06/2020   INGUINAL HERNIA REPAIR Left 2003   LEFT OOPHORECTOMY  2006   LUMBAR FUSION  2005   L5-S1   SHOULDER ARTHROSCOPY WITH ROTATOR CUFF REPAIR AND SUBACROMIAL DECOMPRESSION  04/06/2023   Procedure: SHOULDER ARTHROSCOPY WITH ROTATOR CUFF REPAIR , EXTENSIVE DEBRIDEMENT, AND SUBACROMIAL DECOMPRESSION;  Surgeon: Huel Cote, MD;  Location: [facility];  Service: Orthopedics;;   SHOULDER ARTHROSCOPY WITH SUBACROMIAL DECOMPRESSION AND OPEN ROTATOR C Right 07/14/2016   Procedure: RIGHT SHOULDER ARTHROSCOPY WITH SUBACROMIAL DECOMPRESSION, DISTAL CLAVICLE RESECTION AND MINI OPEN ROTATOR CUFF REPAIR, OPEN BICEP TENDODESIS;  Surgeon: Valeria Batman, MD;  Location: [facility];  Service: Orthopedics;  Laterality: Right;   SHOULDER CLOSED REDUCTION Right 09/08/2016   Procedure: RIGHT CLOSED MANIPULATION SHOULDER;  Surgeon: Valeria Batman, MD;  Location:  SURGERY CENTER;  Service: Orthopedics;  Laterality: Right;   TUBAL LIGATION  UPPER GASTROINTESTINAL ENDOSCOPY  10/06/2020   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 09/21/2021   Chronic left shoulder pain 09/21/2021   Fatty infiltration of liver 07/26/2020   Diarrhea, functional 06/22/2020   Uveitis, intermediate, bilateral 09/07/2019   Generalized edema 06/13/2019   Hospital discharge follow-up 06/07/2019   Multinodular goiter 12/05/2017   Microalbuminuria due to type 2 diabetes mellitus (HCC) 11/26/2017   Encounter for preventive health examination 11/26/2017   Menopause syndrome 06/10/2017   Adverse reaction to vaccine, sequela 03/25/2017   Atypical  chest pain 02/27/2017   PSVT (paroxysmal supraventricular tachycardia) (HCC) 11/26/2016   Nontraumatic incomplete tear of right rotator cuff 07/14/2016   AC (acromioclavicular) arthritis 07/14/2016   Impingement syndrome of right shoulder 07/14/2016   Fibrocystic breast changes, right 05/21/2016   Insomnia 10/16/2015   Snoring 07/26/2015   Chronic migraine w/o aura w/o status migrainosus, not intractable 02/24/2015   History of concussion 07/21/2014   Hyperlipidemia associated with type 2 diabetes mellitus (HCC) 06/13/2014   Vitamin D deficiency 05/07/2013   Chronic migraine 11/20/2012   S/P Total Abdominal Hysterectomy and Left Salpingo-oophorectomy 10/20/2011   Headache around the eyes    Essential hypertension 07/20/2011    ONSET DATE: Pain and paresthesia in the right distal extremity and hand after second shoulder surgery in October 2024  REFERRING DIAG: R20.0 (ICD-10-CM) - Right arm numbness   THERAPY DIAG:  Muscle weakness (generalized)  Pain in right hand  Paresthesia of skin  Acute pain of right shoulder  Stiffness of right wrist, not elsewhere classified  Rationale for Evaluation and Treatment: Rehabilitation  PERTINENT HISTORY: Right wrist carpal tunnel syndrome Right cubital tunnel syndrome  PRECAUTIONS: None;  RED FLAGS:  None   WEIGHT BEARING RESTRICTIONS: No   SUBJECTIVE:   SUBJECTIVE STATEMENT:  She states having physical therapy again, but according to her, they state that she needs to work and OT first.  She is also having some insurance issues possibly and needs to check her authorizations-she feels like she would like to just do OT possibly.  She is stating some continued night paresthesias as well as in the day and at work    PAIN:  Are you having pain? Yes: NPRS scale:  3-4/10 tingling/numb now at rest, rates "pain" 3/10 "pricking" in fingertips now at rest Pain location: Right hand digits 3 through 5 mostly, hypersensitivity in the dorsum of  the hand and occasional numbness in digits 1 and 2. Pain description: Shooting, burning, nerve pain Aggravating factors: Movement at the shoulder, weightbearing, etc. Relieving factors: Unknown   PATIENT GOALS: Have less pain and paresthesia in the right hand  NEXT MD VISIT: 10/16/2023 (with Dr. Denese Killings)   OBJECTIVE: (All objective assessments below are from initial evaluation on: 08/09/23 unless otherwise specified.)   HAND DOMINANCE: Right   ADLs: Overall ADLs: States decreased ability to grab, hold household objects, pain and difficulty to open containers, perform FMS tasks (manipulate fasteners on clothing), mild to moderate bathing problems as well.    FUNCTIONAL OUTCOME MEASURES: Eval: Patient Specific Functional Scale: 3 (hair care, IADLS, driving)  (Higher Score  =  Better Ability for the Selected Tasks)      UPPER EXTREMITY ROM     Shoulder to Wrist AROM Right eval Rt 08/14/23  Shoulder flexion    Shoulder abduction  Shoulder extension    Shoulder internal rotation    Shoulder external rotation    Elbow flexion 121 122  Elbow extension (-10)  (-16)   Forearm supination 75 53  Forearm pronation  90   Wrist flexion 35 57  Wrist extension 74 68  (Blank rows = not tested)   Hand AROM Right eval  Full Fist Ability (or Gap to Distal Palmar Crease) full  Thumb Opposition  (Kapandji Scale)  8/10  (Blank rows = not tested)   UPPER EXTREMITY MMT:     MMT Right Evaluation Left Evaluation  Shoulder flexion    Shoulder abduction    Shoulder adduction    Shoulder extension    Shoulder internal rotation    Shoulder external rotation    Middle trapezius    Lower trapezius    Elbow flexion    Elbow extension    Forearm supination 4+/5 5/5  Forearm pronation 4+/5 5/5  Wrist flexion 4-/5 5/5  Wrist extension 4+/5 5/5  (Blank rows = not tested)  HAND FUNCTION: Eval: Observed weakness in affected Rt hand.  Grip strength Right: 33 lbs, Left: 77 lbs     COORDINATION: Eval: No coordination deficits for Rt hand FMS, but GMS at the shoulder are definitely off.  9 Hole Peg Test Right: 24sec, Left: 22 sec (26 sec is WFL)   SENSATION: Eval:  Light touch intact today, though complaints of paresthesia and burning shooting pains at times in the right hand going up the arm.  2PDT:   Lt IF, SF both 4mm (WNL)  Rt IF: 5mm; thumb: inconsistent answers between 65mm-9mm; SF: 5mm (4-75mm is within functional limits for sensation testing)   OBSERVATIONS:    TODAY'S TREATMENT:  08/14/23: She starts with active range of motion for exercise as well as new measures which shows mixed progress.  Wrist flexion reading much better now, but other motions including elbow extension worse for some reason.  We review her HEP and OT is supervising and assisting with stretches.  Pt feels a lot of tension up to her shoulder with supination stretches.   Wrist flexion is going relatively well. States some shocking pain when elbow left on table for more than 30 sec, and is reminded to avoid provacative postures. Wrist ext also very good PROM looks like at least 60-70* with fingers in full ext during stretches.   She was shown new tricep stretches but had a limiting tightness in posterior/inferior sh capsule, but the stretch was encouraged to work on this limiting factor.  We then review nerve gliding which she tolerates with some states tingling, tension, encouraged to not "over do it."     Leaves in no additional pain today.    Exercises - Seated Scapular Retraction  - 4-6 x daily - 5-10 reps - Reach arms upward   - 4 x daily - 10 reps - Standing Elbow Flexion Extension AROM  - 4-6 x daily - 10-15 reps - Forearm Supination Stretch  - 3-4 x daily - 3-5 reps - 15 sec hold - Wrist Flexion Stretch  - 4 x daily - 3-5 reps - 15 sec hold - Wrist Extension Stretch Pronated  - 4 x daily - 3-5 reps - 15 hold - Median Nerve Flossing  - 2-3 x daily - 5-10 reps - Ulnar Nerve/Median  Glide- Low Level  - 4-6 x daily - 5-10 reps - Tricep Stretch- DO SEATED BY TABLE  - 4 x daily - 5 reps - 15  hold   PATIENT EDUCATION: Education details: See tx section above for details  Person educated: Patient Education method: Verbal Instruction, Teach back, Handouts  Education comprehension: States and demonstrates understanding, Additional Education required    HOME EXERCISE PROGRAM: Access Code: [email or web address] URL: [email or web address] Date: 08/09/2023 Prepared by: Fannie Knee   GOALS: Goals reviewed with patient? Yes   SHORT TERM GOALS: (STG required if POC>30 days) Target Date: 08/25/2023  Pt will obtain protective, custom orthotic. Goal status: TBD/PRN  2.  Pt will demo/state understanding of initial HEP to improve pain levels and prerequisite motion. Goal status: INITIAL   LONG TERM GOALS: Target Date: 09/22/23  Pt will improve functional ability by decreased impairment per PSFS assessment from 3 to 6 or better, for better quality of life. Goal status: INITIAL  2.  Pt will improve grip strength in right hand from 33 lbs to at least 50 lbs for functional use at home and in IADLs. Goal status: INITIAL  3.  Pt will improve A/ROM in right wrist flexion from 35 degrees to at least 60 degrees, to have functional motion for tasks like reach and grasp.  Goal status: INITIAL  4.  Pt will improve strength in right wrist flexion from 4 -/5 MMT to at least 4+/5 MMT to have increased functional ability to carry out selfcare and higher-level homecare tasks with less difficulty. Goal status: INITIAL  5.  Pt will decrease pain/paresthesia complaints at worst from 7-8/10 to 4/10 or better to have better sleep and occupational participation in daily roles. Goal status: INITIAL   ASSESSMENT:  CLINICAL IMPRESSION: 08/14/23: No significant progress with motion yet, but is only been a week.  Continue plan of care and possibly to help with nighttime paresthesia and  postures, consider a nighttime wrist cock up.   PLAN:  OT FREQUENCY: 1x/week  OT DURATION: 6 weeks through 09/22/2023 and up to 8 total visits as needed  PLANNED INTERVENTIONS: 97168 OT Re-evaluation, 97535 self care/ADL training, 84696 therapeutic exercise, 97530 therapeutic activity, 97112 neuromuscular re-education, 97140 manual therapy, 97039 fluidotherapy, 97010 moist heat, 97010 cryotherapy, 97760 Orthotics management and training, 29528 Splinting (initial encounter), [id number] Subsequent splinting/medication, Dry needling, coping strategies training, patient/family education, and DME and/or AE instructions  CONSULTED AND AGREED WITH PLAN OF CARE: Patient  PLAN FOR NEXT SESSION:   Consider wrist cock-up for night issues, check HEP, advance as tolerated.   Fannie Knee, OTR/L, CHT 08/14/2023, 3:25 PM

## 2023-08-14 ENCOUNTER — Ambulatory Visit (INDEPENDENT_AMBULATORY_CARE_PROVIDER_SITE_OTHER): Admitting: Rehabilitative and Restorative Service Providers"

## 2023-08-14 ENCOUNTER — Encounter: Payer: Self-pay | Admitting: Rehabilitative and Restorative Service Providers"

## 2023-08-14 DIAGNOSIS — M6281 Muscle weakness (generalized): Secondary | ICD-10-CM | POA: Diagnosis not present

## 2023-08-14 DIAGNOSIS — R202 Paresthesia of skin: Secondary | ICD-10-CM

## 2023-08-14 DIAGNOSIS — M25511 Pain in right shoulder: Secondary | ICD-10-CM

## 2023-08-14 DIAGNOSIS — M25631 Stiffness of right wrist, not elsewhere classified: Secondary | ICD-10-CM

## 2023-08-14 DIAGNOSIS — M79641 Pain in right hand: Secondary | ICD-10-CM | POA: Diagnosis not present

## 2023-08-15 ENCOUNTER — Encounter (HOSPITAL_BASED_OUTPATIENT_CLINIC_OR_DEPARTMENT_OTHER): Payer: Commercial Managed Care - PPO

## 2023-08-15 ENCOUNTER — Ambulatory Visit (HOSPITAL_BASED_OUTPATIENT_CLINIC_OR_DEPARTMENT_OTHER): Payer: Commercial Managed Care - PPO | Admitting: Physical Therapy

## 2023-08-15 ENCOUNTER — Encounter (HOSPITAL_BASED_OUTPATIENT_CLINIC_OR_DEPARTMENT_OTHER): Payer: Self-pay | Admitting: Physical Therapy

## 2023-08-15 DIAGNOSIS — M6281 Muscle weakness (generalized): Secondary | ICD-10-CM | POA: Diagnosis not present

## 2023-08-15 DIAGNOSIS — M542 Cervicalgia: Secondary | ICD-10-CM | POA: Diagnosis not present

## 2023-08-15 DIAGNOSIS — M25511 Pain in right shoulder: Secondary | ICD-10-CM | POA: Diagnosis not present

## 2023-08-15 NOTE — Therapy (Signed)
 OUTPATIENT PHYSICAL THERAPY SHOULDER TREATMENT   Patient Name: [name] MRN: [id number] DOB:[birth year], 53 y.o., female Today's Date: 08/15/2023  END OF SESSION:  PT End of Session - 08/15/23 0856     Visit Number 13    Number of Visits 24    Date for PT Re-Evaluation 10/17/23    Authorization Type Lanetta Inch 2024    PT Start Time 0857   late check in   PT Stop Time 0929    PT Time Calculation (min) 32 min    Activity Tolerance Patient tolerated treatment well                Past Medical History:  Diagnosis Date   Allergy    Anemia    Anxiety    claustrophobic   Asthma    Back pain    Biceps tendonosis of right shoulder    Cataract    Mixed OU   COVID-19    covid PNA hospitalized 2020, 06/06/21   Diabetes mellitus 2011   did not start metforfin, losing weight   Difficult intubation    tooth got chipped one time   Dyspnea    Essential hypertension    Fatty liver    Food allergy    Frozen shoulder    left, had steroid injection 10/05/21   Headache disorder    History of concussion    HLD (hyperlipidemia)    Hypertensive retinopathy    OU   IBS (irritable bowel syndrome)    Infertility, female    Joint pain    Lactose intolerance    Leg edema    Migraines    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   Palpitation    Post-menopausal    Seborrheic dermatitis    Shoulder impingement syndrome, right    Stage 3a chronic kidney disease (CKD) (HCC)    Type 2 diabetes mellitus with complication, with long-term current use of insulin (HCC) 2018   Uveitis    [facility] est in 2020 seen specialist in [place] [facility] last seen 11 or 05/2020   Vitamin D deficiency    Past Surgical History:  Procedure Laterality Date   ABDOMINAL HYSTERECTOMY  2006   heavy menses, endometriosis, l oophrectomy   BREAST BIOPSY Right 2018   benign   BREAST EXCISIONAL BIOPSY Right 2003   benign   BREAST EXCISIONAL BIOPSY Right 1999   benign   COLONOSCOPY   10/06/2020   INGUINAL HERNIA REPAIR Left 2003   LEFT OOPHORECTOMY  2006   LUMBAR FUSION  2005   L5-S1   SHOULDER ARTHROSCOPY WITH ROTATOR CUFF REPAIR AND SUBACROMIAL DECOMPRESSION  04/06/2023   Procedure: SHOULDER ARTHROSCOPY WITH ROTATOR CUFF REPAIR , EXTENSIVE DEBRIDEMENT, AND SUBACROMIAL DECOMPRESSION;  Surgeon: Huel Cote, MD;  Location: [facility];  Service: Orthopedics;;   SHOULDER ARTHROSCOPY WITH SUBACROMIAL DECOMPRESSION AND OPEN ROTATOR C Right 07/14/2016   Procedure: RIGHT SHOULDER ARTHROSCOPY WITH SUBACROMIAL DECOMPRESSION, DISTAL CLAVICLE RESECTION AND MINI OPEN ROTATOR CUFF REPAIR, OPEN BICEP TENDODESIS;  Surgeon: Valeria Batman, MD;  Location: [facility];  Service: Orthopedics;  Laterality: Right;   SHOULDER CLOSED REDUCTION Right 09/08/2016   Procedure: RIGHT CLOSED MANIPULATION SHOULDER;  Surgeon: Valeria Batman, MD;  Location: [facility];  Service: Orthopedics;  Laterality: Right;   TUBAL LIGATION     UPPER GASTROINTESTINAL ENDOSCOPY  10/06/2020   Patient Active Problem List   Diagnosis Date Noted   Traumatic incomplete tear of right  rotator cuff 04/06/2023   Vertigo 07/03/2022   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 09/21/2021   Chronic left shoulder pain 09/21/2021   Fatty infiltration of liver 07/26/2020   Diarrhea, functional 06/22/2020   Uveitis, intermediate, bilateral 09/07/2019   Generalized edema 06/13/2019   Hospital discharge follow-up 06/07/2019   Multinodular goiter 12/05/2017   Microalbuminuria due to type 2 diabetes mellitus (HCC) 11/26/2017   Encounter for preventive health examination 11/26/2017   Menopause syndrome 06/10/2017   Adverse reaction to vaccine, sequela 03/25/2017   Atypical chest pain 02/27/2017   PSVT (paroxysmal supraventricular tachycardia) (HCC) 11/26/2016   Nontraumatic incomplete tear of right rotator cuff 07/14/2016   AC (acromioclavicular) arthritis 07/14/2016    Impingement syndrome of right shoulder 07/14/2016   Fibrocystic breast changes, right 05/21/2016   Insomnia 10/16/2015   Snoring 07/26/2015   Chronic migraine w/o aura w/o status migrainosus, not intractable 02/24/2015   History of concussion 07/21/2014   Hyperlipidemia associated with type 2 diabetes mellitus (HCC) 06/13/2014   Vitamin D deficiency 05/07/2013   Chronic migraine 11/20/2012   S/P Total Abdominal Hysterectomy and Left Salpingo-oophorectomy 10/20/2011   Headache around the eyes    Essential hypertension 07/20/2011    PCP: Dr. Duncan Dull   REFERRING PROVIDER: Dr. Huel Cote   REFERRING DIAG: Right Rotator Cuff Repair   THERAPY DIAG:  Muscle weakness (generalized)  Acute pain of right shoulder  Cervicalgia  Rationale for Evaluation and Treatment: Rehabilitation  ONSET DATE: 04/06/23  SUBJECTIVE:                                                                                                                                                                                      SUBJECTIVE STATEMENT:    08/15/2023 pt states after last session she had a bit of spasming. Did a lot of stretching with OT yesterday. States she is having less prickly feeling in hand today.     Eval: Pt is here for continuation of PT for the shoulder post op but has severe R UE nerve pain. Pt notes that NCV study in the chart is inaccurate. Done in January of 2025 by Dr. Alvester Morin. Pt did get an OT referral for the cubital tunnel and carpal tunnel. Pt does WFH at a laptop for occupation. Last 3 digits will have NT. Pt does note dropping items and hand weakness. Turning the neck will cause R UE pain. Pt must turn trunk in order to look over shoulder when driving. Pt notes that NT the Sunday after surgery. Pt has a history of some NT prior and had NCV in 2019 as well.  PERTINENT HISTORY:  Previous RC repair on R side  PAIN:  Are you having pain? Yes: NPRS scale: 5-6/10 shoulder,  distally 2-3/10 Pain location: ant R shoulder and radiates down into last 3 digits Pain description: Achy and sharp, tingling,shooting  Aggravating factors: Driving Relieving factors: Gabapentin   PRECAUTIONS: RC repair  RED FLAGS: None   WEIGHT BEARING RESTRICTIONS: No  FALLS:  Has patient fallen in last 6 months? No  LIVING ENVIRONMENT:  Lives with family- college aged daughter Stairs No AD usage    OCCUPATION: Arts development officer   PLOF: Independent  PATIENT GOALS: Return to using right shoulder for functional activities like lifting objects and reaching overhead    OBJECTIVE:  Note: Objective measures were completed at Evaluation unless otherwise noted.   DIAGNOSTIC FINDINGS:  Discs: Disc spaces are maintained.   C2-3: No significant disc bulge. No neural foraminal stenosis. No central canal stenosis.   C3-4: No disc protrusion. Mild bilateral foraminal stenosis. No central canal stenosis.   C4-5: No disc protrusion. No right foraminal stenosis. Mild left foraminal stenosis. No central canal stenosis.   C5-6: Small central disc protrusion. No foraminal or central canal stenosis.   C6-7: Minimal disc bulge. Mild bilateral foraminal narrowing. No central canal stenosis.   C7-T1: No disc protrusion. Mild bilateral foraminal stenosis. No central canal stenosis.   IMPRESSION: 1. Mild cervical spine spondylosis as described above. 2. No acute osseous injury of the cervical spine.    PATIENT SURVEYS:  FOTO 40 with target of 60   COGNITION: Overall cognitive status: Within functional limits for tasks assessed     SENSATION: Light touch: Impaired  numbness and tingling down shoulder into right palm   POSTURE: Rounded shoulders; R UE guarding position with elbow flexion  CERVICAL ROM: painful into R side of neck and   Active ROM A/PROM (deg) eval  Flexion 50% R deviation  Extension 20% R deviation  Right lateral flexion 0%  Left lateral  flexion 0%  Right rotation 0%  Left rotation 0%   (Blank rows = not tested)   UPPER EXTREMITY ROM:   Active/ ROM Right eval  Shoulder flexion 80  Shoulder extension   Shoulder abduction 90  Shoulder adduction   Shoulder internal rotation To belly  Shoulder external rotation 30  Elbow flexion 125  Elbow extension -20  Wrist flexion 20  Wrist extension 50  Wrist ulnar deviation   Wrist radial deviation   Wrist pronation   Wrist supination   (Blank rows = not tested)   UPPER EXTREMITY MMT:  MMT Right eval  Shoulder flexion 3-  Shoulder extension   Shoulder abduction 3-   Shoulder adduction   Shoulder internal rotation 4  Shoulder external rotation 3-  Middle trapezius   Lower trapezius   Elbow flexion   Elbow extension   Wrist flexion   Wrist extension   Wrist ulnar deviation   Wrist radial deviation   Wrist pronation   Wrist supination   Grip strength (lbs) For next visit  (Blank rows = not tested)  Special Tests:  Reflexes  1+ C7 2+ C6   (+) Distraction (-) Spurling (painful regardless of over pressure or position) (-) ER Lag  C4-T1 myotomal weakness on R    PALPATION:  Hypersensitivity and TTP of R UT, levator, pec minor, biceps, deltoid, supra   TODAY'S TREATMENT:  [facility] Adult PT Treatment:                                                DATE: 08/15/23 Therapeutic Exercise: Standing chin tuck at wall 3x5 w/ ball cues for posture and comfortable ROM Standing scap retractions w/ foam roll at wall 3x5, second two sets in cradle position to improve anterior shoulder discomfort  Shoulder abduction walkout 2x5 with towel roll for hand positioning at table  HEP update + education/handout  Self Care: Education/discussion re: sleep positioning, symptom modification, introductory PNE   [facility] Adult PT Treatment:                                                 DATE: 08/10/23 Therapeutic Exercise: Seated table slides flexion AAROM 3x5 cues for comfortable ROM Seated cervical retraction x8 cues for reduced UT compensations Seated scapular retraction 2x8 cues for reduced UT compensations, arms crossed for improved comfort w/ arm positioning Seated table slides abduction AAROM 2x5 cues for comfortable ROM HEP handout + education  Self Care: Time spent w/ education on gentle shoulder movements to mitigate stiffness/guarding, plan with PT going forward (encouraged pt to reach out to insurance to confirm PT/OT visit limit), desensitization strategies, appropriate use of heating pad for symptom modification, positioning    DATE:  08/03/2023: - PROM R shoulder (gentle) - PROM to elbow (gentle)  - Discussed plan going forward with OT/PT, and other MD appts.  - Therapeutic putty squeezes.  - Towel squeezes/ twists.  - Discussed desensitization to R shoulder with different materials and textures to help desensitize shoulder area.   2/18: -PROM R shoulder (gentle) -Gentle GHJ distraction -Light STM to distal triceps  -Pendulums -discussion of posture/positioning     PATIENT EDUCATION: Education details: rationale for interventions, HEP  Person educated: Patient Education method: Explanation, Demonstration, Tactile cues, Verbal cues Education comprehension: verbalized understanding, returned demonstration, verbal cues required, tactile cues required, and needs further education     HOME EXERCISE PROGRAM: Access Code: [email or web address] URL: [email or web address] Date: 08/15/2023 Prepared by: Fransisco Hertz  Exercises - Standing Scapular Retraction  - 3-4 x daily - 1 sets - 10 reps - Seated Shoulder Flexion Towel Slide at Table Top  - 3-4 x daily - 1 sets - 5 reps - Standing Isometric Cervical Retraction with Chin Tucks and Ball at [employer]  - 3-4 x daily - 1 sets - 5 reps - Seated Shoulder Abduction  Towel Slide at Table Top  - 3-4 x daily - 1 sets - 5 reps  ASSESSMENT:  CLINICAL IMPRESSION: 08/15/2023 Pt arrives w/ continued pain, notes some extra anterior shoulder pain this morning compared to baseline. Today we are able to progress cervical/periscapular mobility training with addition of external cues (ball and half foam roll, respectively) which improves pt comfort with movement. Also reports excellent relief w/ addition of abduction AAROM walkouts. No increase in pain on departure and pt reports improved distal RUE symptoms, no adverse events. States for now she would like to continue with PT and OT concurrently, noted apparent improvement in tolerance to past couple PT sessions. Recommend continuing along current POC in order to address relevant deficits and improve functional tolerance. Pt departs today's session in no  acute distress, all voiced questions/concerns addressed appropriately from PT perspective.     Eval: Patient is a 53 y.o. female who was seen today for physical therapy evaluation and treatment s/p RC repair on 10/31. Therapy paused previously due to R sided radicular pain. Pt's s/s appear consistent with potential R cervical radiculopathy. A component of TOS is also on differential diagnosis list. Pt does have fatiguing weakness through C4-T1 myotomes as well as history of cubital and carpal tunnel syndromes. Pt's cervical ROM is highly limited despite normal appear MRI report. Pt's pain is extremely sensitive and irritable with movement. Pt's R UE and neck muscle spasm and guarding are limiting factors to her R shoulder rotator cuff rehab program. Plan to prioritize reduction of R upper quarter pain and muscle spasm at future sessions and introduce R shoulder strengthening and ROM as tolerated. Pt also to have OT consult at a future visit. Pt would benefit from continued skilled therapy in order to reach goals and maximize functional R UE strength and ROM for full return to PLOF.  [name]     OBJECTIVE IMPAIRMENTS: decreased ROM, decreased strength, impaired sensation, impaired UE functional use, and pain.   ACTIVITY LIMITATIONS: carrying, lifting, reach over head, and caring for others  PARTICIPATION LIMITATIONS: meal prep, cleaning, laundry, driving, shopping, community activity, and occupation  PERSONAL FACTORS: Fitness, Past/current experiences, Time since onset of injury/illness/exacerbation, and 1-2 comorbidities:    are also affecting patient's functional outcome.   REHAB POTENTIAL: Fair  CLINICAL DECISION MAKING: unstable/complicated  EVALUATION COMPLEXITY: moderate   GOALS: Goals reviewed with patient? No  SHORT TERM GOALS: Target date: 04/25/2023  PT reviewed the following HEP with patient with patient able to demonstrate a set of the following with min cuing for correction needed. PT educated patient on parameters of therex (how/when to inc/decrease intensity, frequency, rep/set range, stretch hold time, and purpose of therex) with verbalized understanding.  Baseline: NT 04/17/23: Performing independently  Goal status: ONGOING   2.  Patient will demonstrate right shoulder PROM flexion to >=100 degrees and abduction to >=90 degrees as evidence of tissue healing.  Baseline: Shoulder Flex R/L NT/180, Shoulder Abd R/L NT/180  Goal status: ONGOING      LONG TERM GOALS: Target date: 10/17/2023   Patient will have improved function and activity level as evidenced by an increase in FOTO score by 10 points or more.  Baseline: 40  Goal status: ONGOING   2.  Patient will improve right shoulder AROM to be symmetrical with left shoulder AROM for improved RUE function to perform reaching tasks like grabbing objects off a shelf.  Baseline: Shoulder Flex R/L NT/180, Shoulder Abd R/L NT/180  Goal status: ONGOING   3.  Patient will improve right shoulder strength to  Baseline:  Shoulder Flex R/L NT/5, Shoulder Abd R/L NT/5, Shoulder ER R/L NT/NT, Shoulder IR  R/L NT/NT  Goal status: ONGOING    PLAN:  PT FREQUENCY: 1-2x/week  PT DURATION: 12 wks  PLANNED INTERVENTIONS: 97164- PT Re-evaluation, 97110-Therapeutic exercises, 97530- Therapeutic activity, 97112- Neuromuscular re-education, 97535- Self Care, 16109- Manual therapy, 97014- Electrical stimulation (unattended), [phone]- Electrical stimulation (manual), Patient/Family education, Taping, Dry Needling, Joint mobilization, Joint manipulation, Spinal manipulation, Spinal mobilization, Scar mobilization, DME instructions, Cryotherapy, and Moist heat  PLAN FOR NEXT SESSION: cervical retraction with or without manual traction; review nerve gliding, R UE scapular depression, R scapular retraction, cervical ROM focus, pain management in cervical and R upper quarter as pain is limiting RC progression   Onalee Hua  A Adaora Mchaney PT, DPT 08/15/2023 11:38 AM

## 2023-08-16 ENCOUNTER — Other Ambulatory Visit (HOSPITAL_COMMUNITY): Payer: Self-pay

## 2023-08-16 ENCOUNTER — Other Ambulatory Visit: Payer: Self-pay | Admitting: Internal Medicine

## 2023-08-16 ENCOUNTER — Other Ambulatory Visit: Payer: Self-pay

## 2023-08-16 DIAGNOSIS — E1165 Type 2 diabetes mellitus with hyperglycemia: Secondary | ICD-10-CM

## 2023-08-16 MED ORDER — METFORMIN HCL ER 500 MG PO TB24
1000.0000 mg | ORAL_TABLET | Freq: Two times a day (BID) | ORAL | 0 refills | Status: DC
  Filled 2023-08-16: qty 360, 90d supply, fill #0

## 2023-08-17 ENCOUNTER — Encounter (HOSPITAL_BASED_OUTPATIENT_CLINIC_OR_DEPARTMENT_OTHER): Payer: Self-pay | Admitting: Physical Therapy

## 2023-08-17 ENCOUNTER — Ambulatory Visit (HOSPITAL_BASED_OUTPATIENT_CLINIC_OR_DEPARTMENT_OTHER): Payer: Commercial Managed Care - PPO | Admitting: Physical Therapy

## 2023-08-17 DIAGNOSIS — M6281 Muscle weakness (generalized): Secondary | ICD-10-CM

## 2023-08-17 DIAGNOSIS — M542 Cervicalgia: Secondary | ICD-10-CM

## 2023-08-17 DIAGNOSIS — M25511 Pain in right shoulder: Secondary | ICD-10-CM

## 2023-08-17 NOTE — Therapy (Signed)
 OUTPATIENT PHYSICAL THERAPY SHOULDER TREATMENT   Patient Name: [name] MRN: [id number] DOB:30-Jun-1970, 53 y.o., female Today's Date: 08/17/2023  END OF SESSION:  PT End of Session - 08/17/23 1548     Visit Number 14    Number of Visits 24    Date for PT Re-Evaluation 10/17/23    Authorization Type Lanetta Inch 2024    PT Start Time 1533    PT Stop Time 1613    PT Time Calculation (min) 40 min    Activity Tolerance Patient tolerated treatment well                 Past Medical History:  Diagnosis Date   Allergy    Anemia    Anxiety    claustrophobic   Asthma    Back pain    Biceps tendonosis of right shoulder    Cataract    Mixed OU   COVID-19    covid PNA hospitalized 2020, 06/06/21   Diabetes mellitus 2011   did not start metforfin, losing weight   Difficult intubation    tooth got chipped one time   Dyspnea    Essential hypertension    Fatty liver    Food allergy    Frozen shoulder    left, had steroid injection 10/05/21   Headache disorder    History of concussion    HLD (hyperlipidemia)    Hypertensive retinopathy    OU   IBS (irritable bowel syndrome)    Infertility, female    Joint pain    Lactose intolerance    Leg edema    Migraines    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   Palpitation    Post-menopausal    Seborrheic dermatitis    Shoulder impingement syndrome, right    Stage 3a chronic kidney disease (CKD) (HCC)    Type 2 diabetes mellitus with complication, with long-term current use of insulin (HCC) 2018   Uveitis    [facility] est in 2020 seen specialist in [place] [facility] last seen 11 or 05/2020   Vitamin D deficiency    Past Surgical History:  Procedure Laterality Date   ABDOMINAL HYSTERECTOMY  2006   heavy menses, endometriosis, l oophrectomy   BREAST BIOPSY Right 2018   benign   BREAST EXCISIONAL BIOPSY Right 2003   benign   BREAST EXCISIONAL BIOPSY Right 1999   benign   COLONOSCOPY  10/06/2020    INGUINAL HERNIA REPAIR Left 2003   LEFT OOPHORECTOMY  2006   LUMBAR FUSION  2005   L5-S1   SHOULDER ARTHROSCOPY WITH ROTATOR CUFF REPAIR AND SUBACROMIAL DECOMPRESSION  04/06/2023   Procedure: SHOULDER ARTHROSCOPY WITH ROTATOR CUFF REPAIR , EXTENSIVE DEBRIDEMENT, AND SUBACROMIAL DECOMPRESSION;  Surgeon: Huel Cote, MD;  Location: [facility];  Service: Orthopedics;;   SHOULDER ARTHROSCOPY WITH SUBACROMIAL DECOMPRESSION AND OPEN ROTATOR C Right 07/14/2016   Procedure: RIGHT SHOULDER ARTHROSCOPY WITH SUBACROMIAL DECOMPRESSION, DISTAL CLAVICLE RESECTION AND MINI OPEN ROTATOR CUFF REPAIR, OPEN BICEP TENDODESIS;  Surgeon: Valeria Batman, MD;  Location: [facility];  Service: Orthopedics;  Laterality: Right;   SHOULDER CLOSED REDUCTION Right 09/08/2016   Procedure: RIGHT CLOSED MANIPULATION SHOULDER;  Surgeon: Valeria Batman, MD;  Location: [facility];  Service: Orthopedics;  Laterality: Right;   TUBAL LIGATION     UPPER GASTROINTESTINAL ENDOSCOPY  10/06/2020   Patient Active Problem List   Diagnosis Date Noted   Traumatic incomplete tear of right rotator cuff 04/06/2023  Vertigo 07/03/2022   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 09/21/2021   Chronic left shoulder pain 09/21/2021   Fatty infiltration of liver 07/26/2020   Diarrhea, functional 06/22/2020   Uveitis, intermediate, bilateral 09/07/2019   Generalized edema 06/13/2019   Hospital discharge follow-up 06/07/2019   Multinodular goiter 12/05/2017   Microalbuminuria due to type 2 diabetes mellitus (HCC) 11/26/2017   Encounter for preventive health examination 11/26/2017   Menopause syndrome 06/10/2017   Adverse reaction to vaccine, sequela 03/25/2017   Atypical chest pain 02/27/2017   PSVT (paroxysmal supraventricular tachycardia) (HCC) 11/26/2016   Nontraumatic incomplete tear of right rotator cuff 07/14/2016   AC (acromioclavicular) arthritis 07/14/2016   Impingement  syndrome of right shoulder 07/14/2016   Fibrocystic breast changes, right 05/21/2016   Insomnia 10/16/2015   Snoring 07/26/2015   Chronic migraine w/o aura w/o status migrainosus, not intractable 02/24/2015   History of concussion 07/21/2014   Hyperlipidemia associated with type 2 diabetes mellitus (HCC) 06/13/2014   Vitamin D deficiency 05/07/2013   Chronic migraine 11/20/2012   S/P Total Abdominal Hysterectomy and Left Salpingo-oophorectomy 10/20/2011   Headache around the eyes    Essential hypertension 07/20/2011    PCP: Dr. Duncan Dull   REFERRING PROVIDER: Dr. Huel Cote   REFERRING DIAG: Right Rotator Cuff Repair   THERAPY DIAG:  Muscle weakness (generalized)  Acute pain of right shoulder  Cervicalgia  Rationale for Evaluation and Treatment: Rehabilitation  ONSET DATE: 04/06/23  SUBJECTIVE:                                                                                                                                                                                      SUBJECTIVE STATEMENT:    Pt states that there is morning burning since last OT  session. Pt notes that the arm moving is slightly more comfortably but is still pain. She is still limited with carrying/lifting on that R arm. Pt has been trying to keep the arm more relaxed. Pt is only sleeping 3.5 hours a night due to the pain. Pt has needed to ice all day.     Eval: Pt is here for continuation of PT for the shoulder post op but has severe R UE nerve pain. Pt notes that NCV study in the chart is inaccurate. Done in January of 2025 by Dr. Alvester Morin. Pt did get an OT referral for the cubital tunnel and carpal tunnel. Pt does WFH at a laptop for occupation. Last 3 digits will have NT. Pt does note dropping items and hand weakness. Turning the neck will cause R UE pain. Pt must turn trunk in order to look over  shoulder when driving. Pt notes that NT the Sunday after surgery. Pt has a history of some NT prior  and had NCV in 2019 as well.     PERTINENT HISTORY:  Previous RC repair on R side  PAIN:  Are you having pain? Yes: NPRS scale: 3/10 shoulder, distally 2-3/10 Pain location: ant R shoulder and radiates down into last 3 digits Pain description: Achy and sharp, tingling,shooting  Aggravating factors: Driving Relieving factors: Gabapentin   PRECAUTIONS: RC repair  RED FLAGS: None   WEIGHT BEARING RESTRICTIONS: No  FALLS:  Has patient fallen in last 6 months? No  LIVING ENVIRONMENT:  Lives with family- college aged daughter Stairs No AD usage    OCCUPATION: Arts development officer   PLOF: Independent  PATIENT GOALS: Return to using right shoulder for functional activities like lifting objects and reaching overhead    OBJECTIVE:  Note: Objective measures were completed at Evaluation unless otherwise noted.   DIAGNOSTIC FINDINGS:  Discs: Disc spaces are maintained.   C2-3: No significant disc bulge. No neural foraminal stenosis. No central canal stenosis.   C3-4: No disc protrusion. Mild bilateral foraminal stenosis. No central canal stenosis.   C4-5: No disc protrusion. No right foraminal stenosis. Mild left foraminal stenosis. No central canal stenosis.   C5-6: Small central disc protrusion. No foraminal or central canal stenosis.   C6-7: Minimal disc bulge. Mild bilateral foraminal narrowing. No central canal stenosis.   C7-T1: No disc protrusion. Mild bilateral foraminal stenosis. No central canal stenosis.   IMPRESSION: 1. Mild cervical spine spondylosis as described above. 2. No acute osseous injury of the cervical spine.    PATIENT SURVEYS:  FOTO 40 with target of 60   COGNITION: Overall cognitive status: Within functional limits for tasks assessed     SENSATION: Light touch: Impaired  numbness and tingling down shoulder into right palm   POSTURE: Rounded shoulders; R UE guarding position with elbow flexion  CERVICAL ROM: painful  into R side of neck and   Active ROM A/PROM (deg) eval  Flexion 50% R deviation  Extension 20% R deviation  Right lateral flexion 0%  Left lateral flexion 0%  Right rotation 0%  Left rotation 0%   (Blank rows = not tested)   UPPER EXTREMITY ROM:   Active/ ROM Right eval  Shoulder flexion 80  Shoulder extension   Shoulder abduction 90  Shoulder adduction   Shoulder internal rotation To belly  Shoulder external rotation 30  Elbow flexion 125  Elbow extension -20  Wrist flexion 20  Wrist extension 50  Wrist ulnar deviation   Wrist radial deviation   Wrist pronation   Wrist supination   (Blank rows = not tested)   UPPER EXTREMITY MMT:  MMT Right eval  Shoulder flexion 3-  Shoulder extension   Shoulder abduction 3-   Shoulder adduction   Shoulder internal rotation 4  Shoulder external rotation 3-  Middle trapezius   Lower trapezius   Elbow flexion   Elbow extension   Wrist flexion   Wrist extension   Wrist ulnar deviation   Wrist radial deviation   Wrist pronation   Wrist supination   Grip strength (lbs) For next visit  (Blank rows = not tested)  Special Tests:  Reflexes  1+ C7 2+ C6   (+) Distraction (-) Spurling (painful regardless of over pressure or position) (-) ER Lag  C4-T1 myotomal weakness on R    PALPATION:  Hypersensitivity and TTP of R UT, levator, pec  minor, biceps, deltoid, supra   TODAY'S TREATMENT:               [facility] Adult PT Treatment:                                                DATE: 08/15/23 Supine chin tuck with rotation 10x Supine chin tuck with rotation 2x10 Supine chin nod 2x10 Pulley ABD 20x (pt selected sets and reps)  Table biceps stretch 10s 2x4  1lb bicep curl 20x (pt selected sets and reps)  Table ball ABC 1x  Table ball scap depression 20x                                                                                                                             [facility] Adult PT Treatment:                                                 DATE: 08/15/23 Therapeutic Exercise: Standing chin tuck at wall 3x5 w/ ball cues for posture and comfortable ROM Standing scap retractions w/ foam roll at wall 3x5, second two sets in cradle position to improve anterior shoulder discomfort  Shoulder abduction walkout 2x5 with towel roll for hand positioning at table  HEP update + education/handout  Self Care: Education/discussion re: sleep positioning, symptom modification, introductory PNE   [facility] Adult PT Treatment:                                                DATE: 08/10/23 Therapeutic Exercise: Seated table slides flexion AAROM 3x5 cues for comfortable ROM Seated cervical retraction x8 cues for reduced UT compensations Seated scapular retraction 2x8 cues for reduced UT compensations, arms crossed for improved comfort w/ arm positioning Seated table slides abduction AAROM 2x5 cues for comfortable ROM HEP handout + education  Self Care: Time spent w/ education on gentle shoulder movements to mitigate stiffness/guarding, plan with PT going forward (encouraged pt to reach out to insurance to confirm PT/OT visit limit), desensitization strategies, appropriate use of heating pad for symptom modification, positioning    DATE:  08/03/2023: - PROM R shoulder (gentle) - PROM to elbow (gentle)  - Discussed plan going forward with OT/PT, and other MD appts.  - Therapeutic putty squeezes.  - Towel squeezes/ twists.  - Discussed desensitization to R shoulder with different materials and textures to help desensitize shoulder area.   2/18: -PROM R shoulder (gentle) -Gentle GHJ distraction -Light STM to distal triceps  -Pendulums -discussion of posture/positioning     PATIENT EDUCATION: Education  details: rationale for interventions, HEP  Person educated: Patient Education method: Explanation, Demonstration, Tactile cues, Verbal cues Education comprehension: verbalized understanding, returned demonstration,  verbal cues required, tactile cues required, and needs further education     HOME EXERCISE PROGRAM: Access Code: [email or web address] URL: [email or web address] Date: 08/15/2023 Prepared by: Fransisco Hertz  Exercises - Standing Scapular Retraction  - 3-4 x daily - 1 sets - 10 reps - Seated Shoulder Flexion Towel Slide at Table Top  - 3-4 x daily - 1 sets - 5 reps - Standing Isometric Cervical Retraction with Chin Tucks and Ball at [employer]  - 3-4 x daily - 1 sets - 5 reps - Seated Shoulder Abduction Towel Slide at Table Top  - 3-4 x daily - 1 sets - 5 reps  ASSESSMENT:  CLINICAL IMPRESSION:  08/17/2023 Pt arrives with burning sensation at baseline into the R UE. Distal NT today not exacerbated by exercise. Pt was able to tolerate modified ROM exercise in pain free ROM. Pt did not report exacerbation of pain with treatment today. Pt did respond well to modified ROM exercise today. Pt HEP updated today to continue working on A/PROM. Plan to continuation of managing cervical pain and progressing R shoulder ROM as tolerated. Pt to continue with PT and OT sessions. Pt would benefit from continued skilled therapy in order to reach goals and maximize functional R UE strength and ROM for full return to PLOF.   Eval: Patient is a 53 y.o. female who was seen today for physical therapy evaluation and treatment s/p RC repair on 10/31. Therapy paused previously due to R sided radicular pain. Pt's s/s appear consistent with potential R cervical radiculopathy. A component of TOS is also on differential diagnosis list. Pt does have fatiguing weakness through C4-T1 myotomes as well as history of cubital and carpal tunnel syndromes. Pt's cervical ROM is highly limited despite normal appear MRI report. Pt's pain is extremely sensitive and irritable with movement. Pt's R UE and neck muscle spasm and guarding are limiting factors to her R shoulder rotator cuff rehab program. Plan to prioritize reduction of R upper  quarter pain and muscle spasm at future sessions and introduce R shoulder strengthening and ROM as tolerated. Pt also to have OT consult at a future visit. Pt would benefit from continued skilled therapy in order to reach goals and maximize functional R UE strength and ROM for full return to PLOF. [name]     OBJECTIVE IMPAIRMENTS: decreased ROM, decreased strength, impaired sensation, impaired UE functional use, and pain.   ACTIVITY LIMITATIONS: carrying, lifting, reach over head, and caring for others  PARTICIPATION LIMITATIONS: meal prep, cleaning, laundry, driving, shopping, community activity, and occupation  PERSONAL FACTORS: Fitness, Past/current experiences, Time since onset of injury/illness/exacerbation, and 1-2 comorbidities:    are also affecting patient's functional outcome.   REHAB POTENTIAL: Fair  CLINICAL DECISION MAKING: unstable/complicated  EVALUATION COMPLEXITY: moderate   GOALS: Goals reviewed with patient? No  SHORT TERM GOALS: Target date: 04/25/2023  PT reviewed the following HEP with patient with patient able to demonstrate a set of the following with min cuing for correction needed. PT educated patient on parameters of therex (how/when to inc/decrease intensity, frequency, rep/set range, stretch hold time, and purpose of therex) with verbalized understanding.  Baseline: NT 04/17/23: Performing independently  Goal status: ONGOING   2.  Patient will demonstrate right shoulder PROM flexion to >=100 degrees and abduction to >=90 degrees as evidence of tissue healing.  Baseline: Shoulder Flex R/L NT/180, Shoulder  Abd R/L NT/180  Goal status: ONGOING      LONG TERM GOALS: Target date: 10/17/2023   Patient will have improved function and activity level as evidenced by an increase in FOTO score by 10 points or more.  Baseline: 40  Goal status: ONGOING   2.  Patient will improve right shoulder AROM to be symmetrical with left shoulder AROM for improved RUE function  to perform reaching tasks like grabbing objects off a shelf.  Baseline: Shoulder Flex R/L NT/180, Shoulder Abd R/L NT/180  Goal status: ONGOING   3.  Patient will improve right shoulder strength to  Baseline:  Shoulder Flex R/L NT/5, Shoulder Abd R/L NT/5, Shoulder ER R/L NT/NT, Shoulder IR R/L NT/NT  Goal status: ONGOING    PLAN:  PT FREQUENCY: 1-2x/week  PT DURATION: 12 wks  PLANNED INTERVENTIONS: 97164- PT Re-evaluation, 97110-Therapeutic exercises, 97530- Therapeutic activity, 97112- Neuromuscular re-education, 97535- Self Care, 62130- Manual therapy, 97014- Electrical stimulation (unattended), [phone]- Electrical stimulation (manual), Patient/Family education, Taping, Dry Needling, Joint mobilization, Joint manipulation, Spinal manipulation, Spinal mobilization, Scar mobilization, DME instructions, Cryotherapy, and Moist heat  PLAN FOR NEXT SESSION: cervical retraction with or without manual traction; review nerve gliding, R UE scapular depression, R scapular retraction, cervical ROM focus, pain management in cervical and R upper quarter as pain is limiting RC progression  Zebedee Iba PT, DPT 08/17/23 4:48 PM

## 2023-08-21 ENCOUNTER — Encounter: Payer: Self-pay | Admitting: Internal Medicine

## 2023-08-21 ENCOUNTER — Ambulatory Visit (HOSPITAL_BASED_OUTPATIENT_CLINIC_OR_DEPARTMENT_OTHER): Payer: Commercial Managed Care - PPO | Admitting: Physical Therapy

## 2023-08-21 ENCOUNTER — Other Ambulatory Visit (HOSPITAL_COMMUNITY): Payer: Self-pay

## 2023-08-21 ENCOUNTER — Ambulatory Visit: Admitting: Internal Medicine

## 2023-08-21 VITALS — BP 120/82 | HR 81 | Ht 66.0 in | Wt 198.8 lb

## 2023-08-21 DIAGNOSIS — E1129 Type 2 diabetes mellitus with other diabetic kidney complication: Secondary | ICD-10-CM

## 2023-08-21 DIAGNOSIS — I471 Supraventricular tachycardia, unspecified: Secondary | ICD-10-CM | POA: Diagnosis not present

## 2023-08-21 DIAGNOSIS — Z794 Long term (current) use of insulin: Secondary | ICD-10-CM | POA: Diagnosis not present

## 2023-08-21 DIAGNOSIS — E1165 Type 2 diabetes mellitus with hyperglycemia: Secondary | ICD-10-CM | POA: Diagnosis not present

## 2023-08-21 DIAGNOSIS — R809 Proteinuria, unspecified: Secondary | ICD-10-CM | POA: Diagnosis not present

## 2023-08-21 DIAGNOSIS — I10 Essential (primary) hypertension: Secondary | ICD-10-CM

## 2023-08-21 MED ORDER — CELECOXIB 200 MG PO CAPS
200.0000 mg | ORAL_CAPSULE | Freq: Two times a day (BID) | ORAL | 2 refills | Status: DC
  Filled 2023-08-21 – 2023-08-25 (×2): qty 60, 30d supply, fill #0

## 2023-08-21 MED ORDER — METFORMIN HCL ER 500 MG PO TB24
1000.0000 mg | ORAL_TABLET | Freq: Two times a day (BID) | ORAL | 0 refills | Status: DC
  Filled 2023-08-21: qty 360, 90d supply, fill #0

## 2023-08-21 MED ORDER — PANTOPRAZOLE SODIUM 40 MG PO TBEC
40.0000 mg | DELAYED_RELEASE_TABLET | Freq: Every day | ORAL | 3 refills | Status: AC
  Filled 2023-08-21 – 2023-08-25 (×2): qty 30, 30d supply, fill #0

## 2023-08-21 MED ORDER — LOSARTAN POTASSIUM-HCTZ 50-12.5 MG PO TABS
1.0000 | ORAL_TABLET | Freq: Every day | ORAL | 0 refills | Status: DC
  Filled 2023-08-21: qty 90, 90d supply, fill #0

## 2023-08-21 NOTE — Patient Instructions (Addendum)
 Stop meloxicam and try celebrex 1-2 times daily for pain (BETTER nsaid)   You can add up to 2000 mg of acetominophen (tylenol) every day safely  In divided doses (500 mg every 6 hours  Or 1000 mg every 12 hours.)   YOU DO NOT AND NEVER DID (TO MY KNOWLEDGE ) HAVE CKD , BUT I found in the "resolved problems" and deleted it

## 2023-08-21 NOTE — Progress Notes (Unsigned)
 Subjective:  Patient ID: [name], female    DOB: 1970-12-31  Age: 53 y.o. MRN: [id number]  CC: The primary encounter diagnosis was Essential hypertension. Diagnoses of Type 2 diabetes mellitus with hyperglycemia, with long-term current use of insulin (HCC), PSVT (paroxysmal supraventricular tachycardia) (HCC), and Microalbuminuria due to type 2 diabetes mellitus (HCC) were also pertinent to this visit.   HPI [name] presents for  Chief Complaint  Patient presents with   Medical Management of Chronic Issues   Had Shoulder surgery in October by [facility]one and returned to work in late February .  Developed numbness pain and tingling up the right arm diagnosed with cubital and carpal tunnel  syndrome diagnosed by EMG/Reid study  Referred to physiatry by orthopedist . Has not see him or   Michae Kava the [employer] surveyor .  Getting PT/ OT .  Taking lyrica, gabapentin did not help . DROPPING THINGS  FINGERS NUMB   Taking meloxicam prn   Concerned about dx of CKD in chart mentioned by several other providers,  who ironically have prescribed NSAIDs. Reviewed the last 5 yrs of labs,  no evidence of CKD   Diabetic nephropathy:  reviewed UaCr 's  and BP's/   Outpatient Medications Prior to Visit  Medication Sig Dispense Refill   albuterol (VENTOLIN HFA) 108 (90 Base) MCG/ACT inhaler Inhale 2 puffs into the lungs every 6 (six) hours as needed for wheezing or shortness of breath. 18 g 0   aspirin EC 81 MG tablet Take 1 tablet (81 mg total) by mouth daily. Swallow whole. 90 tablet 3   Atogepant (QULIPTA) 60 MG TABS Take 1 tablet (60 mg total) by mouth daily. (Patient taking differently: Take 1 tablet by mouth at bedtime.) 30 tablet 11   atorvastatin (LIPITOR) 20 MG tablet Take 1 tablet (20 mg total) by mouth daily. (Patient taking differently: Take 20 mg by mouth at bedtime.) 90 tablet 3   butalbital-acetaminophen-caffeine (FIORICET) 50-325-40 MG tablet Take 1 tablet by mouth every 6 (six) hours  as needed for headache. Do not refill in less than 30 day 30 tablet 5   Continuous Glucose Sensor (DEXCOM G7 SENSOR) MISC Apply 1 sensor every 10 days. 9 each 4   diazepam (VALIUM) 5 MG tablet Take 1 tablet (5 mg total) by mouth every 12 (twelve) hours as needed for anxiety. (Patient taking differently: Take 5 mg by mouth every 12 (twelve) hours as needed (migraines).) 30 tablet 5   dicyclomine (BENTYL) 20 MG tablet Take 1 tablet (20 mg total) by mouth every 6 (six) hours. (Patient taking differently: Take 20 mg by mouth 3 (three) times daily as needed (as needed).) 90 tablet 1   EPINEPHrine 0.3 mg/0.3 mL IJ SOAJ injection Inject 0.3 mg into the muscle as needed for anaphylaxis. 2 each 0   gabapentin (NEURONTIN) 100 MG capsule Take 1 capsule (100 mg total) by mouth 3 (three) times daily. (Patient taking differently: Take 100 mg by mouth 3 (three) times daily. As needed) 30 capsule 1   Ipratropium-Albuterol (COMBIVENT) 20-100 MCG/ACT AERS respimat Inhale 1 puff into the lungs every 6 (six) hours. (Patient taking differently: Inhale 1 puff into the lungs every 6 (six) hours as needed for shortness of breath.) 4 g 11   metoprolol succinate (TOPROL-XL) 25 MG 24 hr tablet Take 0.5 tablets (12.5 mg total) by mouth daily. (Patient taking differently: Take 12.5 mg by mouth at bedtime.) 45 tablet 0   ondansetron (ZOFRAN-ODT) 4 MG disintegrating tablet Take  1 tablet (4 mg total) by mouth every 8 (eight) hours as needed for nausea or vomiting. 20 tablet 0   pregabalin (LYRICA) 75 MG capsule Take 1 capsule (75 mg total) by mouth 2 (two) times daily. (Patient taking differently: Take 75 mg by mouth 2 (two) times daily. As needed) 60 capsule 0   Semaglutide, 1 MG/DOSE, (OZEMPIC, 1 MG/DOSE,) 4 MG/3ML SOPN Inject 1 mg into the skin once a week as directed. 9 mL 1   Ubrogepant (UBRELVY) 100 MG TABS Take 1 tablet (100 mg total) by mouth as needed. May repeat dose in 2 hours if needed. Max dose 2 tablets in 2 hours 16  tablet 11   Vitamin D, Ergocalciferol, (DRISDOL) 1.25 MG (50000 UNIT) CAPS capsule Take 1 capsule by mouth once a week. (Patient taking differently: Take 50,000 Units by mouth once a week. Sunday) 4 capsule 11   losartan-hydrochlorothiazide (HYZAAR) 50-12.5 MG tablet Take 1 tablet by mouth daily. 90 tablet 0   meloxicam (MOBIC) 15 MG tablet Take 1 tablet (15 mg total) by mouth daily. (Patient taking differently: Take 15 mg by mouth daily. As needed) 14 tablet 0   metFORMIN (GLUCOPHAGE-XR) 500 MG 24 hr tablet Take 2 tablets (1,000 mg total) by mouth 2 (two) times daily with a meal. 1000mg  at breakfast and 1000 mg at dinner 360 tablet 0   fluconazole (DIFLUCAN) 150 MG tablet Take 1 tablet (150 mg total) by mouth daily. (Patient not taking: Reported on 08/21/2023) 5 tablet 0   oxyCODONE (ROXICODONE) 5 MG immediate release tablet Take 1 tablet (5 mg total) by mouth every 4 (four) hours as needed for severe pain (pain score 7-10) or breakthrough pain. (Patient not taking: Reported on 08/21/2023) 15 tablet 0   No facility-administered medications prior to visit.    Review of Systems;  Patient denies headache, fevers, malaise, unintentional weight loss, skin rash, eye pain, sinus congestion and sinus pain, sore throat, dysphagia,  hemoptysis , cough, dyspnea, wheezing, chest pain, palpitations, orthopnea, edema, abdominal pain, nausea, melena, diarrhea, constipation, flank pain, dysuria, hematuria, urinary  Frequency, nocturia, numbness, tingling, seizures,  Focal weakness, Loss of consciousness,  Tremor, insomnia, depression, anxiety, and suicidal ideation.      Objective:  BP 120/82   Pulse 81   Ht 5\' 6"  (1.676 m)   Wt 198 lb 12.8 oz (90.2 kg)   SpO2 99%   BMI 32.09 kg/m   BP Readings from Last 3 Encounters:  08/21/23 120/82  08/01/23 135/87  04/12/23 120/80    Wt Readings from Last 3 Encounters:  08/21/23 198 lb 12.8 oz (90.2 kg)  08/01/23 201 lb 12.8 oz (91.5 kg)  04/12/23 200 lb 2 oz  (90.8 kg)    Physical Exam  Lab Results  Component Value Date   HGBA1C 6.5 (A) 07/28/2023   HGBA1C 5.9 (A) 01/17/2023   HGBA1C 6.3 (A) 06/30/2022    Lab Results  Component Value Date   CREATININE 0.70 03/29/2023   CREATININE 0.69 07/01/2022   CREATININE 1.12 09/21/2021    Lab Results  Component Value Date   WBC 6.7 03/29/2023   HGB 12.6 03/29/2023   HCT 37.6 03/29/2023   PLT 203 03/29/2023   GLUCOSE 136 (H) 03/29/2023   CHOL 125 07/01/2022   TRIG 95 07/01/2022   HDL 34 (L) 07/01/2022   LDLDIRECT 88.0 09/21/2021   LDLCALC 73 07/01/2022   ALT 10 07/01/2022   AST 14 07/01/2022   NA 140 03/29/2023   K 3.4 (L)  03/29/2023   CL 104 03/29/2023   CREATININE 0.70 03/29/2023   BUN 12 03/29/2023   CO2 28 03/29/2023   TSH 0.48 09/21/2021   HGBA1C 6.5 (A) 07/28/2023   MICROALBUR 4.6 (H) 08/22/2023    No results found.  Assessment & Plan:  .Essential hypertension -     Microalbumin / creatinine urine ratio -     Comprehensive metabolic panel; Future  Type 2 diabetes mellitus with hyperglycemia, with long-term current use of insulin (HCC) -     metFORMIN HCl ER; Take 2 tablets (1,000 mg total) by mouth 2 times daily with a meal (at breakfast and at dinner)  Dispense: 360 tablet; Refill: 0 -     Lipid Panel w/reflex Direct LDL; Future  PSVT (paroxysmal supraventricular tachycardia) (HCC) Assessment & Plan: Suspected by history odf symptomatic palpitations.  Continue metoprolol   12.5 mg daily    Microalbuminuria due to type 2 diabetes mellitus (HCC) Assessment & Plan: Managed with losartan. BP is at goal . Repeat  UaCR is < 30 .  Will recheck at next visit and offer SGLT 2 Inhibitor.  Patient notified via mychart    Other orders -     Losartan Potassium-HCTZ; Take 1 tablet by mouth daily.  Dispense: 90 tablet; Refill: 0 -     Celecoxib; Take 1 capsule (200 mg total) by mouth 2 (two) times daily as needed for arm pain  Dispense: 60 capsule; Refill: 2 -      Pantoprazole Sodium; Take 1 tablet (40 mg total) by mouth daily.  Dispense: 30 tablet; Refill: 3     I spent 34 minutes on the day of this face to face encounter reviewing patient's  most recent visit with orthopedics  prior relevant surgical and non surgical procedures, recent  labs and imaging studies, counseling on weight management,  reviewing the assessment and plan with patient, and post visit ordering and reviewing of  diagnostics and therapeutics with patient  .   Follow-up: Return in about 6 months (around 02/21/2024).   Sherlene Shams, MD

## 2023-08-22 ENCOUNTER — Other Ambulatory Visit: Payer: Self-pay

## 2023-08-22 LAB — MICROALBUMIN / CREATININE URINE RATIO
Creatinine,U: 156.7 mg/dL
Microalb Creat Ratio: 29.3 mg/g (ref 0.0–30.0)
Microalb, Ur: 4.6 mg/dL — ABNORMAL HIGH (ref 0.0–1.9)

## 2023-08-22 NOTE — Assessment & Plan Note (Signed)
 Managed with losartan. BP is at goal . Repeat  UaCR is < 30 .  Will recheck at next visit and offer SGLT 2 Inhibitor.  Patient notified via mychart

## 2023-08-22 NOTE — Assessment & Plan Note (Signed)
Suspected by history odf symptomatic palpitations.  Continue metoprolol   12.5 mg daily

## 2023-08-23 ENCOUNTER — Encounter (HOSPITAL_BASED_OUTPATIENT_CLINIC_OR_DEPARTMENT_OTHER): Payer: Commercial Managed Care - PPO

## 2023-08-23 ENCOUNTER — Encounter (HOSPITAL_BASED_OUTPATIENT_CLINIC_OR_DEPARTMENT_OTHER): Payer: Commercial Managed Care - PPO | Admitting: Physical Therapy

## 2023-08-23 NOTE — Therapy (Signed)
 OUTPATIENT OCCUPATIONAL THERAPY TREATMENT NOTE   Patient Name: [name] MRN: [id number] DOB:August 29, 1970, 53 y.o., female Today's Date: 08/25/2023  PCP: Duncan Dull, MD REFERRING PROVIDER: Samuella Cota, MD   END OF SESSION:  OT End of Session - 08/25/23 0803     Visit Number 3    Number of Visits 8    Date for OT Re-Evaluation 09/22/23    Authorization Type [facility]    OT Start Time [phone]    OT Stop Time 0843    OT Time Calculation (min) 40 min    Activity Tolerance Patient tolerated treatment well;No increased pain;Patient limited by lethargy;Patient limited by pain    Behavior During Therapy [facility] for tasks assessed/performed             Past Medical History:  Diagnosis Date   Allergy    Anemia    Anxiety    claustrophobic   Asthma    Back pain    Biceps tendonosis of right shoulder    Cataract    Mixed OU   COVID-19    covid PNA hospitalized 2020, 06/06/21   Diabetes mellitus 2011   did not start metforfin, losing weight   Difficult intubation    tooth got chipped one time   Dyspnea    Essential hypertension    Fatty liver    Food allergy    Frozen shoulder    left, had steroid injection 10/05/21   Headache disorder    History of concussion    HLD (hyperlipidemia)    Hypertensive retinopathy    OU   IBS (irritable bowel syndrome)    Infertility, female    Joint pain    Lactose intolerance    Leg edema    Migraines    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   Palpitation    Post-menopausal    Seborrheic dermatitis    Shoulder impingement syndrome, right    Type 2 diabetes mellitus with complication, with long-term current use of insulin (HCC) 2018   Uveitis    [place] [facility] est in 2020 seen specialist in [place] [facility] last seen 11 or 05/2020   Vitamin D deficiency    Past Surgical History:  Procedure Laterality Date   ABDOMINAL HYSTERECTOMY  2006   heavy menses, endometriosis, l oophrectomy   BREAST BIOPSY Right 2018    benign   BREAST EXCISIONAL BIOPSY Right 2003   benign   BREAST EXCISIONAL BIOPSY Right 1999   benign   COLONOSCOPY  10/06/2020   INGUINAL HERNIA REPAIR Left 2003   LEFT OOPHORECTOMY  2006   LUMBAR FUSION  2005   L5-S1   SHOULDER ARTHROSCOPY WITH ROTATOR CUFF REPAIR AND SUBACROMIAL DECOMPRESSION  04/06/2023   Procedure: SHOULDER ARTHROSCOPY WITH ROTATOR CUFF REPAIR , EXTENSIVE DEBRIDEMENT, AND SUBACROMIAL DECOMPRESSION;  Surgeon: Huel Cote, MD;  Location: [facility];  Service: Orthopedics;;   SHOULDER ARTHROSCOPY WITH SUBACROMIAL DECOMPRESSION AND OPEN ROTATOR C Right 07/14/2016   Procedure: RIGHT SHOULDER ARTHROSCOPY WITH SUBACROMIAL DECOMPRESSION, DISTAL CLAVICLE RESECTION AND MINI OPEN ROTATOR CUFF REPAIR, OPEN BICEP TENDODESIS;  Surgeon: Valeria Batman, MD;  Location: [facility];  Service: Orthopedics;  Laterality: Right;   SHOULDER CLOSED REDUCTION Right 09/08/2016   Procedure: RIGHT CLOSED MANIPULATION SHOULDER;  Surgeon: Valeria Batman, MD;  Location: [facility];  Service: Orthopedics;  Laterality: Right;   TUBAL LIGATION     UPPER GASTROINTESTINAL ENDOSCOPY  10/06/2020   Patient Active Problem List  Diagnosis Date Noted   Traumatic incomplete tear of right rotator cuff 04/06/2023   Vertigo 07/03/2022   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 09/21/2021   Chronic left shoulder pain 09/21/2021   Fatty infiltration of liver 07/26/2020   Diarrhea, functional 06/22/2020   Uveitis, intermediate, bilateral 09/07/2019   Generalized edema 06/13/2019   Hospital discharge follow-up 06/07/2019   Multinodular goiter 12/05/2017   Microalbuminuria due to type 2 diabetes mellitus (HCC) 11/26/2017   Encounter for preventive health examination 11/26/2017   Menopause syndrome 06/10/2017   Adverse reaction to vaccine, sequela 03/25/2017   Atypical chest pain 02/27/2017   PSVT (paroxysmal supraventricular tachycardia) (HCC)  11/26/2016   Nontraumatic incomplete tear of right rotator cuff 07/14/2016   AC (acromioclavicular) arthritis 07/14/2016   Impingement syndrome of right shoulder 07/14/2016   Fibrocystic breast changes, right 05/21/2016   Insomnia 10/16/2015   Snoring 07/26/2015   Chronic migraine w/o aura w/o status migrainosus, not intractable 02/24/2015   History of concussion 07/21/2014   Hyperlipidemia associated with type 2 diabetes mellitus (HCC) 06/13/2014   Vitamin D deficiency 05/07/2013   Chronic migraine 11/20/2012   S/P Total Abdominal Hysterectomy and Left Salpingo-oophorectomy 10/20/2011   Headache around the eyes    Essential hypertension 07/20/2011    ONSET DATE: Pain and paresthesia in the right distal extremity and hand after second shoulder surgery in October 2024  REFERRING DIAG: R20.0 (ICD-10-CM) - Right arm numbness   THERAPY DIAG:  Paresthesia of skin  Pain in right hand  Stiffness of right wrist, not elsewhere classified  Rationale for Evaluation and Treatment: Rehabilitation  PERTINENT HISTORY: Right wrist carpal tunnel syndrome Right cubital tunnel syndrome  PRECAUTIONS: None;  RED FLAGS:  None   WEIGHT BEARING RESTRICTIONS: No   SUBJECTIVE:   SUBJECTIVE STATEMENT:  She states being awake most of the night hand feeling numb, but stretches and wrist extension are helpful.  Locates most numbness to digits 1 through 3 of right hand now but also sensitivity to medial posterior elbow/epicondyle.  She is going on a beach trip next Friday.     PAIN:  Are you having pain? Yes: NPRS scale:  4-5/10 tingling/numb now at rest Pain location: Right hand digits 3 through 5 mostly, hypersensitivity in the dorsum of the hand and occasional numbness in digits 1 and 2. Pain description: Shooting, burning, nerve pain Aggravating factors: Movement at the shoulder, weightbearing, etc. Relieving factors: Unknown   PATIENT GOALS: Have less pain and paresthesia in the right  hand  NEXT MD VISIT: 10/16/2023 (with Dr. Denese Killings)   OBJECTIVE: (All objective assessments below are from initial evaluation on: 08/09/23 unless otherwise specified.)   HAND DOMINANCE: Right   ADLs: Overall ADLs: States decreased ability to grab, hold household objects, pain and difficulty to open containers, perform FMS tasks (manipulate fasteners on clothing), mild to moderate bathing problems as well.    FUNCTIONAL OUTCOME MEASURES: Eval: Patient Specific Functional Scale: 3 (hair care, IADLS, driving)  (Higher Score  =  Better Ability for the Selected Tasks)      UPPER EXTREMITY ROM     Shoulder to Wrist AROM Right eval Rt 08/14/23 Rt 08/25/23  Shoulder flexion     Shoulder abduction     Shoulder extension     Shoulder internal rotation     Shoulder external rotation     Elbow flexion 121 122 137  Elbow extension (-10)  (-16)  (-20)  Forearm supination 75 53 57  Forearm  pronation  90  90  Wrist flexion 35 57 35  Wrist extension 74 68 75  (Blank rows = not tested)   Hand AROM Right eval  Full Fist Ability (or Gap to Distal Palmar Crease) full  Thumb Opposition  (Kapandji Scale)  8/10  (Blank rows = not tested)   UPPER EXTREMITY MMT:     MMT Right Evaluation Left Evaluation  Shoulder flexion    Shoulder abduction    Shoulder adduction    Shoulder extension    Shoulder internal rotation    Shoulder external rotation    Middle trapezius    Lower trapezius    Elbow flexion    Elbow extension    Forearm supination 4+/5 5/5  Forearm pronation 4+/5 5/5  Wrist flexion 4-/5 5/5  Wrist extension 4+/5 5/5  (Blank rows = not tested)  HAND FUNCTION: Eval: Observed weakness in affected Rt hand.  Grip strength Right: 33 lbs, Left: 77 lbs    COORDINATION: Eval: No coordination deficits for Rt hand FMS, but GMS at the shoulder are definitely off.  9 Hole Peg Test Right: 24sec, Left: 22 sec (26 sec is WFL)   SENSATION: Eval:  Light touch intact today, though  complaints of paresthesia and burning shooting pains at times in the right hand going up the arm.  2PDT:   Lt IF, SF both 4mm (WNL)  Rt IF: 5mm; thumb: inconsistent answers between 43mm-9mm; SF: 5mm (4-80mm is within functional limits for sensation testing)   OBSERVATIONS:    TODAY'S TREATMENT:  08/25/23: Today she locates tingling mainly to dorsum of fingers 1 through 3 and volar aspect of fingers 1 through 3.  She performs active range of motion which shows improvements in elbow flexion but worse elbow extension, improved wrist extension but worst wrist flexion, improved forearm supination.  OT reviews her home exercise program including nerve glides and avoiding poor postures with her.  OT also fabricates a custom orthosis to help with night/early morning numbness and pain.  She states her medial elbow is a very sore and tender area, so OT advises to use vibration technique around this area to help decrease nerve pain for about 5 minutes or less.  She can try this several times a day as helpful.  She states this helps her pain today, she leaves and goes to PT stating some tingling through the dorsum of her forearm and the lateral part of her shoulder but better around her medial elbow.   "Try a vibration around "sore spots" to relieve nerve pressure.     Sleep with new orthosis each night for at least a month.   Remember wrist flexion stretches- but do only 3x for ~10 sec.  Ok if you get a little numb- this is not repetitive.   Try tricep stretches with elbow on towel at your standing desk.    Keep watching postures and continue shoulder with PT as well!"   Custom orthotic fabrication was indicated due to pt's complaints of numbness that are worse in night/early morning in median nerve distribution and need for safe, functional positioning. OT fabricated custom right arm wrist cock up orthosis for pt today to keep the median nerve in neutral position and avoid impingement in the night. It fit  well with no areas of pressure, pt states a comfortable fit. Pt was educated on the wearing schedule (only at night), to avoid exposing it to sources of heat, to wipe clean as needed (do not wash, use harsh  detergents), to call or come in ASAP if it is causing any irritation or is not achieving desired function. It will be checked/adjusted in upcoming sessions, as needed. Pt states understanding all directions.      Exercises reviewed today:  - Seated Scapular Retraction  - 4-6 x daily - 5-10 reps - Reach arms upward   - 4 x daily - 10 reps - Standing Elbow Flexion Extension AROM  - 4-6 x daily - 10-15 reps - Forearm Supination Stretch  - 3-4 x daily - 3-5 reps - 15 sec hold - Wrist Flexion Stretch  - 4 x daily - 3-5 reps - 15 sec hold - Wrist Extension Stretch Pronated  - 4 x daily - 3-5 reps - 15 hold - Median Nerve Flossing  - 2-3 x daily - 5-10 reps - Ulnar Nerve/Median Glide- Low Level  - 4-6 x daily - 5-10 reps - Tricep Stretch- DO SEATED BY TABLE  - 4 x daily - 5 reps - 15 hold   PATIENT EDUCATION: Education details: See tx section above for details  Person educated: Patient Education method: Verbal Instruction, Teach back, Handouts  Education comprehension: States and demonstrates understanding, Additional Education required    HOME EXERCISE PROGRAM: Access Code: [email or web address] URL: [email or web address] Date: 08/09/2023 Prepared by: Fannie Knee   GOALS: Goals reviewed with patient? Yes   SHORT TERM GOALS: (STG required if POC>30 days) Target Date: 08/25/2023  Pt will obtain protective, custom orthotic. Goal status: TBD/PRN  2.  Pt will demo/state understanding of initial HEP to improve pain levels and prerequisite motion. Goal status: INITIAL   LONG TERM GOALS: Target Date: 09/22/23  Pt will improve functional ability by decreased impairment per PSFS assessment from 3 to 6 or better, for better quality of life. Goal status: INITIAL  2.  Pt will  improve grip strength in right hand from 33 lbs to at least 50 lbs for functional use at home and in IADLs. Goal status: INITIAL  3.  Pt will improve A/ROM in right wrist flexion from 35 degrees to at least 60 degrees, to have functional motion for tasks like reach and grasp.  Goal status: INITIAL  4.  Pt will improve strength in right wrist flexion from 4 -/5 MMT to at least 4+/5 MMT to have increased functional ability to carry out selfcare and higher-level homecare tasks with less difficulty. Goal status: INITIAL  5.  Pt will decrease pain/paresthesia complaints at worst from 7-8/10 to 4/10 or better to have better sleep and occupational participation in daily roles. Goal status: INITIAL   ASSESSMENT:  CLINICAL IMPRESSION: 08/25/23: She continues to have some decreased range of motion at the elbow, which is somewhat strange, but also demonstrates full elbow extension at various points of the session.  Pain does seem to be improving somewhat with treatment, and wrist extension which is a huge indicator of carpal tunnel compression is improving.  Still having somewhat atypical complaints of medial-posterior elbow tenderness yet median nerve distribution numbness.  Overall there is some improvement which is encouraging.    PLAN:  OT FREQUENCY: 1x/week  OT DURATION: 6 weeks through 09/22/2023 and up to 8 total visits as needed  PLANNED INTERVENTIONS: 97168 OT Re-evaluation, 97535 self care/ADL training, 51884 therapeutic exercise, 97530 therapeutic activity, 97112 neuromuscular re-education, 97140 manual therapy, 97039 fluidotherapy, 97010 moist heat, 97010 cryotherapy, 97760 Orthotics management and training, 16606 Splinting (initial encounter), [id number] Subsequent splinting/medication, Dry needling, coping strategies training, patient/family education, and DME and/or AE instructions  CONSULTED AND AGREED WITH PLAN OF CARE: Patient  PLAN FOR NEXT SESSION:   Check orthosis as needed,  continue plan of care  Jaquanda Wickersham Christell Constant, OTR/L, CHT 08/25/2023, 8:51 AM

## 2023-08-24 ENCOUNTER — Encounter: Payer: Self-pay | Admitting: Internal Medicine

## 2023-08-24 ENCOUNTER — Other Ambulatory Visit: Payer: Self-pay | Admitting: Internal Medicine

## 2023-08-24 ENCOUNTER — Other Ambulatory Visit (HOSPITAL_COMMUNITY): Payer: Self-pay

## 2023-08-24 DIAGNOSIS — E1129 Type 2 diabetes mellitus with other diabetic kidney complication: Secondary | ICD-10-CM

## 2023-08-24 MED ORDER — LOSARTAN POTASSIUM 100 MG PO TABS
100.0000 mg | ORAL_TABLET | Freq: Every day | ORAL | 1 refills | Status: DC
  Filled 2023-08-24 – 2023-08-25 (×2): qty 90, 90d supply, fill #0
  Filled 2023-12-03: qty 90, 90d supply, fill #1

## 2023-08-24 NOTE — Assessment & Plan Note (Signed)
 Worsening.  Increase losartan to 100 mg daily and dc hctz

## 2023-08-25 ENCOUNTER — Ambulatory Visit: Admitting: Physical Therapy

## 2023-08-25 ENCOUNTER — Encounter: Payer: Self-pay | Admitting: Rehabilitative and Restorative Service Providers"

## 2023-08-25 ENCOUNTER — Other Ambulatory Visit: Payer: Self-pay

## 2023-08-25 ENCOUNTER — Other Ambulatory Visit (HOSPITAL_COMMUNITY): Payer: Self-pay

## 2023-08-25 ENCOUNTER — Encounter: Payer: Self-pay | Admitting: Physical Therapy

## 2023-08-25 ENCOUNTER — Ambulatory Visit: Admitting: Rehabilitative and Restorative Service Providers"

## 2023-08-25 DIAGNOSIS — M542 Cervicalgia: Secondary | ICD-10-CM

## 2023-08-25 DIAGNOSIS — R202 Paresthesia of skin: Secondary | ICD-10-CM

## 2023-08-25 DIAGNOSIS — M79641 Pain in right hand: Secondary | ICD-10-CM

## 2023-08-25 DIAGNOSIS — M6281 Muscle weakness (generalized): Secondary | ICD-10-CM | POA: Diagnosis not present

## 2023-08-25 DIAGNOSIS — M25511 Pain in right shoulder: Secondary | ICD-10-CM

## 2023-08-25 DIAGNOSIS — M25631 Stiffness of right wrist, not elsewhere classified: Secondary | ICD-10-CM

## 2023-08-25 NOTE — Therapy (Signed)
 OUTPATIENT PHYSICAL THERAPY SHOULDER TREATMENT   Patient Name: [name] MRN: [id number] DOB:1970/11/28, 53 y.o., female Today's Date: 08/25/2023  END OF SESSION:  PT End of Session - 08/25/23 0849     Visit Number 15    Number of Visits 24    Date for PT Re-Evaluation 10/17/23    Authorization Type Lanetta Inch 2024    Authorization Time Period 25 PT/OT    PT Start Time 0844    PT Stop Time 0924    PT Time Calculation (min) 40 min    Activity Tolerance Patient tolerated treatment well    Behavior During Therapy [facility] for tasks assessed/performed                  Past Medical History:  Diagnosis Date   Allergy    Anemia    Anxiety    claustrophobic   Asthma    Back pain    Biceps tendonosis of right shoulder    Cataract    Mixed OU   COVID-19    covid PNA hospitalized 2020, 06/06/21   Diabetes mellitus 2011   did not start metforfin, losing weight   Difficult intubation    tooth got chipped one time   Dyspnea    Essential hypertension    Fatty liver    Food allergy    Frozen shoulder    left, had steroid injection 10/05/21   Headache disorder    History of concussion    HLD (hyperlipidemia)    Hypertensive retinopathy    OU   IBS (irritable bowel syndrome)    Infertility, female    Joint pain    Lactose intolerance    Leg edema    Migraines    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   Palpitation    Post-menopausal    Seborrheic dermatitis    Shoulder impingement syndrome, right    Type 2 diabetes mellitus with complication, with long-term current use of insulin (HCC) 2018   Uveitis    [place] [facility] est in 2020 seen specialist in [place] [facility] last seen 11 or 05/2020   Vitamin D deficiency    Past Surgical History:  Procedure Laterality Date   ABDOMINAL HYSTERECTOMY  2006   heavy menses, endometriosis, l oophrectomy   BREAST BIOPSY Right 2018   benign   BREAST EXCISIONAL BIOPSY Right 2003   benign   BREAST EXCISIONAL  BIOPSY Right 1999   benign   COLONOSCOPY  10/06/2020   INGUINAL HERNIA REPAIR Left 2003   LEFT OOPHORECTOMY  2006   LUMBAR FUSION  2005   L5-S1   SHOULDER ARTHROSCOPY WITH ROTATOR CUFF REPAIR AND SUBACROMIAL DECOMPRESSION  04/06/2023   Procedure: SHOULDER ARTHROSCOPY WITH ROTATOR CUFF REPAIR , EXTENSIVE DEBRIDEMENT, AND SUBACROMIAL DECOMPRESSION;  Surgeon: Huel Cote, MD;  Location: [facility];  Service: Orthopedics;;   SHOULDER ARTHROSCOPY WITH SUBACROMIAL DECOMPRESSION AND OPEN ROTATOR C Right 07/14/2016   Procedure: RIGHT SHOULDER ARTHROSCOPY WITH SUBACROMIAL DECOMPRESSION, DISTAL CLAVICLE RESECTION AND MINI OPEN ROTATOR CUFF REPAIR, OPEN BICEP TENDODESIS;  Surgeon: Valeria Batman, MD;  Location: [facility];  Service: Orthopedics;  Laterality: Right;   SHOULDER CLOSED REDUCTION Right 09/08/2016   Procedure: RIGHT CLOSED MANIPULATION SHOULDER;  Surgeon: Valeria Batman, MD;  Location: [facility];  Service: Orthopedics;  Laterality: Right;   TUBAL LIGATION     UPPER GASTROINTESTINAL ENDOSCOPY  10/06/2020   Patient Active Problem List   Diagnosis Date Noted  Traumatic incomplete tear of right rotator cuff 04/06/2023   Vertigo 07/03/2022   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 09/21/2021   Chronic left shoulder pain 09/21/2021   Fatty infiltration of liver 07/26/2020   Diarrhea, functional 06/22/2020   Uveitis, intermediate, bilateral 09/07/2019   Generalized edema 06/13/2019   Hospital discharge follow-up 06/07/2019   Multinodular goiter 12/05/2017   Microalbuminuria due to type 2 diabetes mellitus (HCC) 11/26/2017   Encounter for preventive health examination 11/26/2017   Menopause syndrome 06/10/2017   Adverse reaction to vaccine, sequela 03/25/2017   Atypical chest pain 02/27/2017   PSVT (paroxysmal supraventricular tachycardia) (HCC) 11/26/2016   Nontraumatic incomplete tear of right rotator cuff 07/14/2016   AC  (acromioclavicular) arthritis 07/14/2016   Impingement syndrome of right shoulder 07/14/2016   Fibrocystic breast changes, right 05/21/2016   Insomnia 10/16/2015   Snoring 07/26/2015   Chronic migraine w/o aura w/o status migrainosus, not intractable 02/24/2015   History of concussion 07/21/2014   Hyperlipidemia associated with type 2 diabetes mellitus (HCC) 06/13/2014   Vitamin D deficiency 05/07/2013   Chronic migraine 11/20/2012   S/P Total Abdominal Hysterectomy and Left Salpingo-oophorectomy 10/20/2011   Headache around the eyes    Essential hypertension 07/20/2011    PCP: Dr. Duncan Dull   REFERRING PROVIDER: Dr. Huel Cote   REFERRING DIAG: Right Rotator Cuff Repair   THERAPY DIAG:  Muscle weakness (generalized)  Acute pain of right shoulder  Cervicalgia  Rationale for Evaluation and Treatment: Rehabilitation  ONSET DATE: 04/06/23  SUBJECTIVE:                                                                                                                                                                                      SUBJECTIVE STATEMENT:    Still having a hard time sleeping, feels like a lot of bee stings and a burning pain. Not sure what I did yesterday to make it flare up like this. Nate gave me a brace and he also gave me some wrist stretches that help. Sometimes ice helps shoulder, sometimes heat helps. Got the TENS like Hessie Diener told me. Nerve in my arm is on fire this morning (forearm). Better earlier, by the time I leave work this afternoon it really feels flared up. Haven't been able to sleep on my right side since last August, have been trying to sleep on left side now but that's difficult now too because I need to keep my arm straight. Just from the little bit that I did with OT has my shoulder and pec on fire and tight.     Eval: Pt is here for continuation of PT  for the shoulder post op but has severe R UE nerve pain. Pt notes that NCV study in the  chart is inaccurate. Done in January of 2025 by Dr. Alvester Morin. Pt did get an OT referral for the cubital tunnel and carpal tunnel. Pt does WFH at a laptop for occupation. Last 3 digits will have NT. Pt does note dropping items and hand weakness. Turning the neck will cause R UE pain. Pt must turn trunk in order to look over shoulder when driving. Pt notes that NT the Sunday after surgery. Pt has a history of some NT prior and had NCV in 2019 as well.     PERTINENT HISTORY:  Previous RC repair on R side  PAIN:  Are you having pain? Yes: NPRS scale: 4-5 when I came in, right now 3/10 Pain location: ant R shoulder and radiates down into last 3 digits Pain description: Achy and sharp, tingling,shooting  Aggravating factors: Driving Relieving factors: Gabapentin   PRECAUTIONS: RC repair  RED FLAGS: None   WEIGHT BEARING RESTRICTIONS: No  FALLS:  Has patient fallen in last 6 months? No  LIVING ENVIRONMENT:  Lives with family- college aged daughter Stairs No AD usage    OCCUPATION: Arts development officer   PLOF: Independent  PATIENT GOALS: Return to using right shoulder for functional activities like lifting objects and reaching overhead    OBJECTIVE:  Note: Objective measures were completed at Evaluation unless otherwise noted.   DIAGNOSTIC FINDINGS:  Discs: Disc spaces are maintained.   C2-3: No significant disc bulge. No neural foraminal stenosis. No central canal stenosis.   C3-4: No disc protrusion. Mild bilateral foraminal stenosis. No central canal stenosis.   C4-5: No disc protrusion. No right foraminal stenosis. Mild left foraminal stenosis. No central canal stenosis.   C5-6: Small central disc protrusion. No foraminal or central canal stenosis.   C6-7: Minimal disc bulge. Mild bilateral foraminal narrowing. No central canal stenosis.   C7-T1: No disc protrusion. Mild bilateral foraminal stenosis. No central canal stenosis.   IMPRESSION: 1. Mild  cervical spine spondylosis as described above. 2. No acute osseous injury of the cervical spine.    PATIENT SURVEYS:  FOTO 40 with target of 60   COGNITION: Overall cognitive status: Within functional limits for tasks assessed     SENSATION: Light touch: Impaired  numbness and tingling down shoulder into right palm   POSTURE: Rounded shoulders; R UE guarding position with elbow flexion  CERVICAL ROM: painful into R side of neck and   Active ROM A/PROM (deg) eval  Flexion 50% R deviation  Extension 20% R deviation  Right lateral flexion 0%  Left lateral flexion 0%  Right rotation 0%  Left rotation 0%   (Blank rows = not tested)   UPPER EXTREMITY ROM:   Active/ ROM Right eval  Shoulder flexion 80  Shoulder extension   Shoulder abduction 90  Shoulder adduction   Shoulder internal rotation To belly  Shoulder external rotation 30  Elbow flexion 125  Elbow extension -20  Wrist flexion 20  Wrist extension 50  Wrist ulnar deviation   Wrist radial deviation   Wrist pronation   Wrist supination   (Blank rows = not tested)   UPPER EXTREMITY MMT:  MMT Right eval  Shoulder flexion 3-  Shoulder extension   Shoulder abduction 3-   Shoulder adduction   Shoulder internal rotation 4  Shoulder external rotation 3-  Middle trapezius   Lower trapezius   Elbow flexion   Elbow  extension   Wrist flexion   Wrist extension   Wrist ulnar deviation   Wrist radial deviation   Wrist pronation   Wrist supination   Grip strength (lbs) For next visit  (Blank rows = not tested)  Special Tests:  Reflexes  1+ C7 2+ C6   (+) Distraction (-) Spurling (painful regardless of over pressure or position) (-) ER Lag  C4-T1 myotomal weakness on R    PALPATION:  Hypersensitivity and TTP of R UT, levator, pec minor, biceps, deltoid, supra   TODAY'S TREATMENT:               [facility] Adult PT Treatment:                                                DATE:    08/25/23  Chin  tucks 12x3 seconds supine  Supine chin tucks + rotation x10  R UT stretch 2x30 seconds PT overpressure R levator stretch 2x30 seconds gentle PT overpressure Supine AAROM 1# rod x10 (very limited ROM only able to get to about 45* R UE) Supine chest presses 1# rod x10   Standing forward/backward ball rolls on table with WB/pressure as tolerated x15  Standing R/L ball rolls on table with WB/pressure as tolerated for cross midline ABD/ADD x15  Standing shoulder ABD with ball on table with WB/pressure as tolerated for gentle ROM x15    Suboccipital release x3 rounds Gentle cervical traction x3 rounds      08/15/23 Supine chin tuck with rotation 10x Supine chin tuck with rotation 2x10 Supine chin nod 2x10 Pulley ABD 20x (pt selected sets and reps)  Table biceps stretch 10s 2x4  1lb bicep curl 20x (pt selected sets and reps)  Table ball ABC 1x  Table ball scap depression 20x                                                                                                                             [facility] Adult PT Treatment:                                                DATE: 08/15/23 Therapeutic Exercise: Standing chin tuck at wall 3x5 w/ ball cues for posture and comfortable ROM Standing scap retractions w/ foam roll at wall 3x5, second two sets in cradle position to improve anterior shoulder discomfort  Shoulder abduction walkout 2x5 with towel roll for hand positioning at table  HEP update + education/handout  Self Care: Education/discussion re: sleep positioning, symptom modification, introductory PNE   [facility] Adult PT Treatment:  DATE: 08/10/23 Therapeutic Exercise: Seated table slides flexion AAROM 3x5 cues for comfortable ROM Seated cervical retraction x8 cues for reduced UT compensations Seated scapular retraction 2x8 cues for reduced UT compensations, arms crossed for improved comfort w/ arm positioning Seated table slides abduction  AAROM 2x5 cues for comfortable ROM HEP handout + education  Self Care: Time spent w/ education on gentle shoulder movements to mitigate stiffness/guarding, plan with PT going forward (encouraged pt to reach out to insurance to confirm PT/OT visit limit), desensitization strategies, appropriate use of heating pad for symptom modification, positioning    DATE:  08/03/2023: - PROM R shoulder (gentle) - PROM to elbow (gentle)  - Discussed plan going forward with OT/PT, and other MD appts.  - Therapeutic putty squeezes.  - Towel squeezes/ twists.  - Discussed desensitization to R shoulder with different materials and textures to help desensitize shoulder area.   2/18: -PROM R shoulder (gentle) -Gentle GHJ distraction -Light STM to distal triceps  -Pendulums -discussion of posture/positioning     PATIENT EDUCATION: Education details: rationale for interventions, HEP  Person educated: Patient Education method: Explanation, Demonstration, Tactile cues, Verbal cues Education comprehension: verbalized understanding, returned demonstration, verbal cues required, tactile cues required, and needs further education     HOME EXERCISE PROGRAM: Access Code: [email or web address] URL: [email or web address] Date: 08/15/2023 Prepared by: Fransisco Hertz  Exercises - Standing Scapular Retraction  - 3-4 x daily - 1 sets - 10 reps - Seated Shoulder Flexion Towel Slide at Table Top  - 3-4 x daily - 1 sets - 5 reps - Standing Isometric Cervical Retraction with Chin Tucks and Ball at [employer]  - 3-4 x daily - 1 sets - 5 reps - Seated Shoulder Abduction Towel Slide at Table Top  - 3-4 x daily - 1 sets - 5 reps  ASSESSMENT:  CLINICAL IMPRESSION:  08/25/2023   Continued work on general cervical retraction progressions today, also upper quarter mobility and flexibility as well. Still very pain limited and multiple complaints of nerve pains in multiple regions in her neck and shoulder. Spent a little  more time on manual today to try to give her more relief before work, will continue efforts. Very limited by pain today.      Eval: Patient is a 53 y.o. female who was seen today for physical therapy evaluation and treatment s/p RC repair on 10/31. Therapy paused previously due to R sided radicular pain. Pt's s/s appear consistent with potential R cervical radiculopathy. A component of TOS is also on differential diagnosis list. Pt does have fatiguing weakness through C4-T1 myotomes as well as history of cubital and carpal tunnel syndromes. Pt's cervical ROM is highly limited despite normal appear MRI report. Pt's pain is extremely sensitive and irritable with movement. Pt's R UE and neck muscle spasm and guarding are limiting factors to her R shoulder rotator cuff rehab program. Plan to prioritize reduction of R upper quarter pain and muscle spasm at future sessions and introduce R shoulder strengthening and ROM as tolerated. Pt also to have OT consult at a future visit. Pt would benefit from continued skilled therapy in order to reach goals and maximize functional R UE strength and ROM for full return to PLOF. [name]     OBJECTIVE IMPAIRMENTS: decreased ROM, decreased strength, impaired sensation, impaired UE functional use, and pain.   ACTIVITY LIMITATIONS: carrying, lifting, reach over head, and caring for others  PARTICIPATION LIMITATIONS: meal prep, cleaning, laundry, driving, shopping, community activity, and occupation  PERSONAL FACTORS: Fitness,  Past/current experiences, Time since onset of injury/illness/exacerbation, and 1-2 comorbidities:    are also affecting patient's functional outcome.   REHAB POTENTIAL: Fair  CLINICAL DECISION MAKING: unstable/complicated  EVALUATION COMPLEXITY: moderate   GOALS: Goals reviewed with patient? No  SHORT TERM GOALS: Target date: 04/25/2023  PT reviewed the following HEP with patient with patient able to demonstrate a set of the following with  min cuing for correction needed. PT educated patient on parameters of therex (how/when to inc/decrease intensity, frequency, rep/set range, stretch hold time, and purpose of therex) with verbalized understanding.  Baseline: NT 04/17/23: Performing independently  Goal status: ONGOING   2.  Patient will demonstrate right shoulder PROM flexion to >=100 degrees and abduction to >=90 degrees as evidence of tissue healing.  Baseline: Shoulder Flex R/L NT/180, Shoulder Abd R/L NT/180  Goal status: ONGOING      LONG TERM GOALS: Target date: 10/17/2023   Patient will have improved function and activity level as evidenced by an increase in FOTO score by 10 points or more.  Baseline: 40  Goal status: ONGOING   2.  Patient will improve right shoulder AROM to be symmetrical with left shoulder AROM for improved RUE function to perform reaching tasks like grabbing objects off a shelf.  Baseline: Shoulder Flex R/L NT/180, Shoulder Abd R/L NT/180  Goal status: ONGOING   3.  Patient will improve right shoulder strength to  Baseline:  Shoulder Flex R/L NT/5, Shoulder Abd R/L NT/5, Shoulder ER R/L NT/NT, Shoulder IR R/L NT/NT  Goal status: ONGOING    PLAN:  PT FREQUENCY: 1-2x/week  PT DURATION: 12 wks  PLANNED INTERVENTIONS: 97164- PT Re-evaluation, 97110-Therapeutic exercises, 97530- Therapeutic activity, 97112- Neuromuscular re-education, 97535- Self Care, 21308- Manual therapy, 97014- Electrical stimulation (unattended), [phone]- Electrical stimulation (manual), Patient/Family education, Taping, Dry Needling, Joint mobilization, Joint manipulation, Spinal manipulation, Spinal mobilization, Scar mobilization, DME instructions, Cryotherapy, and Moist heat  PLAN FOR NEXT SESSION: cervical retraction with or without manual traction; review nerve gliding, R UE scapular depression, R scapular retraction, cervical ROM focus, pain management in cervical and R upper quarter as pain is limiting recovery from  most recent surgery   Nedra Hai, PT, DPT 08/25/23 9:24 AM

## 2023-08-28 ENCOUNTER — Encounter (HOSPITAL_BASED_OUTPATIENT_CLINIC_OR_DEPARTMENT_OTHER): Payer: Commercial Managed Care - PPO | Admitting: Physical Therapy

## 2023-08-28 ENCOUNTER — Encounter: Payer: Self-pay | Admitting: Internal Medicine

## 2023-08-29 ENCOUNTER — Other Ambulatory Visit: Payer: Self-pay

## 2023-08-29 ENCOUNTER — Encounter: Payer: Self-pay | Admitting: Physical Medicine and Rehabilitation

## 2023-08-29 ENCOUNTER — Other Ambulatory Visit (HOSPITAL_COMMUNITY): Payer: Self-pay

## 2023-08-29 ENCOUNTER — Encounter: Attending: Physical Medicine and Rehabilitation | Admitting: Physical Medicine and Rehabilitation

## 2023-08-29 VITALS — BP 148/85 | HR 69 | Ht 66.0 in | Wt 200.0 lb

## 2023-08-29 DIAGNOSIS — G5621 Lesion of ulnar nerve, right upper limb: Secondary | ICD-10-CM | POA: Insufficient documentation

## 2023-08-29 DIAGNOSIS — S46011S Strain of muscle(s) and tendon(s) of the rotator cuff of right shoulder, sequela: Secondary | ICD-10-CM | POA: Insufficient documentation

## 2023-08-29 DIAGNOSIS — G5601 Carpal tunnel syndrome, right upper limb: Secondary | ICD-10-CM | POA: Insufficient documentation

## 2023-08-29 MED ORDER — PREGABALIN 25 MG PO CAPS
25.0000 mg | ORAL_CAPSULE | Freq: Every evening | ORAL | 3 refills | Status: AC
  Filled 2023-08-29 – 2023-09-05 (×4): qty 90, 90d supply, fill #0

## 2023-08-29 NOTE — Patient Instructions (Addendum)
 Foods that may reduce pain: 1) Ginger (especially studied for arthritis)- reduce leukotriene production to decrease inflammation 2) Blueberries- high in phytonutrients that decrease inflammation 3) Salmon- marine omega-3s reduce joint swelling and pain 4) Pumpkin seeds- reduce inflammation 5) dark chocolate- reduces inflammation 6) turmeric- reduces inflammation 7) tart cherries - reduce pain and stiffness 8) extra virgin olive oil - its compound olecanthal helps to block prostaglandins  9) chili peppers- can be eaten or applied topically via capsaicin 10) mint- helpful for headache, muscle aches, joint pain, and itching 11) garlic- reduces inflammation 12) Green tea- reduces inflammation and oxidative stress, helps with weight loss, may reduce the risk of cancer, recommend Double Green Matcha [place] of Tea daily  Link to further information on diet for chronic pain: http://www.bray.com/   Turmeric to reduce inflammation--can be used in cooking or taken as a supplement.  Benefits of turmeric:  -Highly anti-inflammatory  -Increases antioxidants  -Improves memory, attention, brain disease  -Lowers risk of heart disease  -May help prevent cancer  -Decreases pain  -Alleviates depression  -Delays aging and decreases risk of chronic disease  -Consume with black pepper to increase absorption    Turmeric Milk Recipe:  1 cup milk  1 tsp turmeric  1 tsp cinnamon  1 tsp grated ginger (optional)  Black pepper (boosts the anti-inflammatory properties of turmeric).  1 tsp honey   Qutenza  Insomnia: -Try to go outside near sunrise -Get exercise during the day.  -Turn off all devices an hour before bedtime.  -Teas that can benefit: chamomile, valerian root, Brahmi (Bacopa) -Can consider over the counter melatonin, magnesium, and/or L-theanine. Melatonin is an anti-oxidant with multiple health  benefits. Magnesium is involved in greater than 300 enzymatic reactions in the body and most of [place] are deficient as our soil is often depleted. There are 7 different types of magnesium- Bioptemizer's is a supplement with all 7 types, and each has unique benefits. Magnesium can also help with constipation and anxiety.  -Pistachios naturally increase the production of melatonin -Cozy Earth bamboo bed sheets are free from toxic chemicals.  -Tart cherry juice or a tart cherry supplement can improve sleep and soreness post-workout   Insomnia: -Try to go outside near sunrise -Get exercise during the day.  -Turn off all devices an hour before bedtime.  -Teas that can benefit: chamomile, valerian root, Brahmi (Bacopa) -Can consider over the counter melatonin, magnesium, and/or L-theanine. Melatonin is an anti-oxidant with multiple health benefits. Magnesium is involved in greater than 300 enzymatic reactions in the body and most of [place] are deficient as our soil is often depleted. There are 7 different types of magnesium- Bioptemizer's is a supplement with all 7 types, and each has unique benefits. Magnesium can also help with constipation and anxiety.  -Pistachios naturally increase the production of melatonin -Cozy Earth bamboo bed sheets are free from toxic chemicals.  -Tart cherry juice or a tart cherry supplement can improve sleep and soreness post-workout

## 2023-08-29 NOTE — Progress Notes (Addendum)
 Subjective:     Patient ID: [name], female   DOB: 25-Jan-1971, 53 y.o.   MRN: [id number]  HPI [name] is a 59 year old man who presents to establish care for traumatic rotator cuff tear.  1) Traumatic rotator cuff tear: -failed surgery, celebrex (affects her stomach even with pantoprazole), Neurontin, Lyrica -has neuropathic pain and nothing touches it -when she is in therapy her shoulder hurts -average pain is 3/10 -discussed that she has returned to work  2) Carpal tunnel syndrome: -her 3rd and 4th digits are numb -drops papers -she is doing OT and PT  3) Cubital tunnel pain:  -diagnosed with EMG    Family History  Problem Relation Age of Onset   Diabetes Mother    Heart disease Mother 45   Hypertension Mother    Hyperlipidemia Mother    Heart failure Mother    Stroke Mother    Thyroid disease Mother    Depression Mother    Sleep apnea Mother    Obesity Mother    Colon polyps Mother    Cancer Father 59       Lung Cancer   Heart disease Father    Mental illness Sister        bipolar, substance abuse,  clean 2 yrs   Hypertension Sister    Cancer Paternal Grandmother 73       breast cancer   Breast cancer Paternal Grandmother    Diabetes Maternal Grandmother    Hypertension Maternal Grandmother    Diabetes Son    Colon cancer Neg Hx    Esophageal cancer Neg Hx    Rectal cancer Neg Hx    Stomach cancer Neg Hx    Social History   Socioeconomic History   Marital status: Divorced    Spouse name: Not on file   Number of children: 2   Years of education: 14   Highest education level: Manufacturing engineer (e.g., MA, MS, MEng, MEd, MSW, MBA)  Occupational History   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  Has a son and a daughter.   Right-handed.   1-3 cups caffeine weekly.   Social Drivers of Corporate investment banker Strain: Not on file  Food Insecurity: Not on file  Transportation Needs: Not on file  Physical Activity: Not on file  Stress: Not on file  Social Connections: Not on file   Past Surgical History:  Procedure Laterality Date   ABDOMINAL HYSTERECTOMY  2006   heavy menses, endometriosis, l oophrectomy   BREAST BIOPSY Right 2018   benign   BREAST EXCISIONAL BIOPSY Right 2003   benign   BREAST EXCISIONAL BIOPSY Right 1999   benign   COLONOSCOPY  10/06/2020   INGUINAL HERNIA REPAIR Left 2003   LEFT OOPHORECTOMY  2006   LUMBAR FUSION  2005   L5-S1   SHOULDER ARTHROSCOPY WITH ROTATOR CUFF REPAIR AND SUBACROMIAL DECOMPRESSION  04/06/2023   Procedure: SHOULDER ARTHROSCOPY WITH ROTATOR CUFF REPAIR , EXTENSIVE DEBRIDEMENT, AND SUBACROMIAL DECOMPRESSION;  Surgeon: Huel Cote, MD;  Location: [facility];  Service: Orthopedics;;   SHOULDER ARTHROSCOPY WITH SUBACROMIAL DECOMPRESSION AND OPEN ROTATOR C Right 07/14/2016   Procedure: RIGHT SHOULDER ARTHROSCOPY WITH SUBACROMIAL DECOMPRESSION, DISTAL CLAVICLE RESECTION AND MINI OPEN ROTATOR CUFF REPAIR, OPEN BICEP TENDODESIS;  Surgeon: Valeria Batman, MD;  Location: [facility];  Service: Orthopedics;  Laterality: Right;   SHOULDER CLOSED REDUCTION Right 09/08/2016   Procedure: RIGHT CLOSED MANIPULATION SHOULDER;  Surgeon: Valeria Batman, MD;  Location: [facility];  Service: Orthopedics;  Laterality: Right;   TUBAL LIGATION     UPPER GASTROINTESTINAL ENDOSCOPY  10/06/2020   Past Surgical History:  Procedure Laterality Date   ABDOMINAL HYSTERECTOMY  2006   heavy menses, endometriosis, l oophrectomy   BREAST BIOPSY Right 2018   benign   BREAST EXCISIONAL BIOPSY Right 2003    benign   BREAST EXCISIONAL BIOPSY Right 1999   benign   COLONOSCOPY  10/06/2020   INGUINAL HERNIA REPAIR Left 2003   LEFT OOPHORECTOMY  2006   LUMBAR FUSION  2005   L5-S1   SHOULDER ARTHROSCOPY WITH ROTATOR CUFF REPAIR AND SUBACROMIAL DECOMPRESSION  04/06/2023   Procedure: SHOULDER ARTHROSCOPY WITH ROTATOR CUFF REPAIR , EXTENSIVE DEBRIDEMENT, AND SUBACROMIAL DECOMPRESSION;  Surgeon: Huel Cote, MD;  Location: [facility];  Service: Orthopedics;;   SHOULDER ARTHROSCOPY WITH SUBACROMIAL DECOMPRESSION AND OPEN ROTATOR C Right 07/14/2016   Procedure: RIGHT SHOULDER ARTHROSCOPY WITH SUBACROMIAL DECOMPRESSION, DISTAL CLAVICLE RESECTION AND MINI OPEN ROTATOR CUFF REPAIR, OPEN BICEP TENDODESIS;  Surgeon: Valeria Batman, MD;  Location: [facility];  Service: Orthopedics;  Laterality: Right;   SHOULDER CLOSED REDUCTION Right 09/08/2016   Procedure: RIGHT CLOSED MANIPULATION SHOULDER;  Surgeon: Valeria Batman, MD;  Location: [facility];  Service: Orthopedics;  Laterality: Right;   TUBAL LIGATION     UPPER GASTROINTESTINAL ENDOSCOPY  10/06/2020   Past Medical History:  Diagnosis Date   Allergy    Anemia    Anxiety    claustrophobic   Asthma    Back pain    Biceps tendonosis of right shoulder    Cataract    Mixed OU   COVID-19    covid PNA hospitalized 2020, 06/06/21   Diabetes mellitus 2011   did not start metforfin, losing weight   Difficult intubation    tooth got chipped one time   Dyspnea    Essential hypertension    Fatty liver    Food allergy    Frozen shoulder    left, had steroid injection 10/05/21   Headache disorder    History of concussion    HLD (hyperlipidemia)    Hypertensive retinopathy    OU   IBS (irritable bowel syndrome)    Infertility, female    Joint pain    Lactose intolerance    Leg edema    Migraines    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  Opioid Risk Score:   Fall Risk Score:  `1  Depression screen [facility] 2/9     08/21/2023    4:17 PM  08/01/2023    8:29 AM 07/01/2022    2:38 PM 05/27/2022    8:20 AM 09/21/2021    3:13 PM 06/23/2021    1:47 PM 06/22/2020    4:08 PM  Depression screen [facility] 2/9  Decreased Interest 1 0 0 0 0 1 0  Down, Depressed, Hopeless 0 1 1 0 0 0 0  PHQ - 2 Score 1 1 1  0 0 1 0  Altered sleeping 0 0 0      Tired, decreased energy 0 0 0      Change in appetite 0 0 0      Feeling bad or failure about yourself  0 0 0      Trouble concentrating 1 1 0      Moving slowly or fidgety/restless 0 0 0      Suicidal thoughts 0 0 0      PHQ-9 Score 2 2 1       Difficult doing work/chores Somewhat difficult Not difficult at all Not difficult at all        Review of Systems     Objective:   Physical Exam Gen: no distress, normal appearing HEENT: oral mucosa pink and moist, NCAT Cardio: Reg rate Chest: normal effort, normal rate of breathing Abd: soft, non-distended Ext: no edema Psych: pleasant, normal affect Skin: intact Neuro: Alert and oriented x3 Decreased sensation in right third and 4th fingers    Assessment:      Plan:     1) Chronic Pain Syndrome secondary to right sided rotator cuff tear -Discussed current symptoms of pain and history of pain.  -Discussed benefits of exercise in reducing pain. -Discussed following foods that may reduce pain: 1) Ginger (especially studied for arthritis)- reduce leukotriene production to decrease inflammation 2) Blueberries- high in phytonutrients that decrease inflammation 3) Salmon- marine omega-3s reduce joint swelling and pain 4) Pumpkin seeds- reduce  inflammation 5) dark chocolate- reduces inflammation 6) turmeric- reduces inflammation 7) tart cherries - reduce pain and stiffness 8) extra virgin olive oil - its compound olecanthal helps to block prostaglandins  9) chili peppers- can be eaten or applied topically via capsaicin 10) mint- helpful for headache, muscle aches, joint pain, and itching 11) garlic- reduces inflammation 12) Green tea- reduces inflammation and oxidative stress, helps with weight loss, may reduce the risk of cancer, recommend Double Green Matcha [place] of Tea daily  Link to further information on diet for chronic pain: http://www.bray.com/   2) Carpal tunnel syndrome: -continue orthotic  3) Cubital tunnel syndrome: -continue PT and OT  -Discussed Qutenza as an option for neuropathic pain control. Discussed that this is a capsaicin patch, stronger than capsaicin cream. Discussed that it is currently approved for diabetic peripheral neuropathy and post-herpetic neuralgia, but that it has also shown benefit in treating other forms of neuropathy. Provided patient with link to site to learn more about the patch: https://www.clark.biz/. Discussed that the patch would be placed in office and benefits usually last 3 months. Discussed that unintended exposure to capsaicin can cause severe irritation of eyes, mucous membranes, respiratory tract, and skin, but that Qutenza is a local treatment and does not have the systemic side effects of other nerve medications. Discussed that there may be pain, itching, erythema, and decreased sensory function associated with the application of Qutenza. Side effects usually subside within 1 week. A cold pack of analgesic medications can help with these side effects. Blood pressure can also be increased due to pain associated with administration of  the patch.   4) Insomnia: -Try to go outside near sunrise -Get exercise  during the day.  -Turn off all devices an hour before bedtime.  -Teas that can benefit: chamomile, valerian root, Brahmi (Bacopa) -Can consider over the counter melatonin, magnesium, and/or L-theanine. Melatonin is an anti-oxidant with multiple health benefits. Magnesium is involved in greater than 300 enzymatic reactions in the body and most of [place] are deficient as our soil is often depleted. There are 7 different types of magnesium- Bioptemizer's is a supplement with all 7 types, and each has unique benefits. Magnesium can also help with constipation and anxiety.  -Pistachios naturally increase the production of melatonin -Cozy Earth bamboo bed sheets are free from toxic chemicals.  -Tart cherry juice or a tart cherry supplement can improve sleep and soreness post-workout

## 2023-08-30 ENCOUNTER — Encounter (HOSPITAL_BASED_OUTPATIENT_CLINIC_OR_DEPARTMENT_OTHER): Payer: Commercial Managed Care - PPO

## 2023-08-30 ENCOUNTER — Encounter (HOSPITAL_BASED_OUTPATIENT_CLINIC_OR_DEPARTMENT_OTHER): Payer: Commercial Managed Care - PPO | Admitting: Physical Therapy

## 2023-08-30 NOTE — Telephone Encounter (Signed)
 I have printed and placed in red folder for completion.

## 2023-08-30 NOTE — Therapy (Signed)
 OUTPATIENT OCCUPATIONAL THERAPY TREATMENT NOTE   Patient Name: [name] MRN: [id number] DOB:17-Jul-1970, 53 y.o., female Today's Date: 09/01/2023  PCP: Duncan Dull, MD REFERRING PROVIDER: Samuella Cota, MD   END OF SESSION:  OT End of Session - 09/01/23 0810     Visit Number 4    Number of Visits 8    Date for OT Re-Evaluation 09/22/23    Authorization Type [facility]    OT Start Time [phone]    OT Stop Time 0836    OT Time Calculation (min) 26 min    Activity Tolerance Patient tolerated treatment well;No increased pain;Patient limited by pain    Behavior During Therapy [facility] for tasks assessed/performed              Past Medical History:  Diagnosis Date   Allergy    Anemia    Anxiety    claustrophobic   Asthma    Back pain    Biceps tendonosis of right shoulder    Cataract    Mixed OU   COVID-19    covid PNA hospitalized 2020, 06/06/21   Diabetes mellitus 2011   did not start metforfin, losing weight   Difficult intubation    tooth got chipped one time   Dyspnea    Essential hypertension    Fatty liver    Food allergy    Frozen shoulder    left, had steroid injection 10/05/21   Headache disorder    History of concussion    HLD (hyperlipidemia)    Hypertensive retinopathy    OU   IBS (irritable bowel syndrome)    Infertility, female    Joint pain    Lactose intolerance    Leg edema    Migraines    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   Palpitation    Post-menopausal    Seborrheic dermatitis    Shoulder impingement syndrome, right    Type 2 diabetes mellitus with complication, with long-term current use of insulin (HCC) 2018   Uveitis    [facility] est in 2020 seen specialist in [place] [facility] last seen 11 or 05/2020   Vitamin D deficiency    Past Surgical History:  Procedure Laterality Date   ABDOMINAL HYSTERECTOMY  2006   heavy menses, endometriosis, l oophrectomy   BREAST BIOPSY Right 2018   benign   BREAST  EXCISIONAL BIOPSY Right 2003   benign   BREAST EXCISIONAL BIOPSY Right 1999   benign   COLONOSCOPY  10/06/2020   INGUINAL HERNIA REPAIR Left 2003   LEFT OOPHORECTOMY  2006   LUMBAR FUSION  2005   L5-S1   SHOULDER ARTHROSCOPY WITH ROTATOR CUFF REPAIR AND SUBACROMIAL DECOMPRESSION  04/06/2023   Procedure: SHOULDER ARTHROSCOPY WITH ROTATOR CUFF REPAIR , EXTENSIVE DEBRIDEMENT, AND SUBACROMIAL DECOMPRESSION;  Surgeon: Huel Cote, MD;  Location: [facility];  Service: Orthopedics;;   SHOULDER ARTHROSCOPY WITH SUBACROMIAL DECOMPRESSION AND OPEN ROTATOR C Right 07/14/2016   Procedure: RIGHT SHOULDER ARTHROSCOPY WITH SUBACROMIAL DECOMPRESSION, DISTAL CLAVICLE RESECTION AND MINI OPEN ROTATOR CUFF REPAIR, OPEN BICEP TENDODESIS;  Surgeon: Valeria Batman, MD;  Location: [facility];  Service: Orthopedics;  Laterality: Right;   SHOULDER CLOSED REDUCTION Right 09/08/2016   Procedure: RIGHT CLOSED MANIPULATION SHOULDER;  Surgeon: Valeria Batman, MD;  Location: [facility];  Service: Orthopedics;  Laterality: Right;   TUBAL LIGATION     UPPER GASTROINTESTINAL ENDOSCOPY  10/06/2020   Patient Active Problem List   Diagnosis  Date Noted   Traumatic incomplete tear of right rotator cuff 04/06/2023   Vertigo 07/03/2022   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 09/21/2021   Chronic left shoulder pain 09/21/2021   Fatty infiltration of liver 07/26/2020   Diarrhea, functional 06/22/2020   Uveitis, intermediate, bilateral 09/07/2019   Generalized edema 06/13/2019   Hospital discharge follow-up 06/07/2019   Multinodular goiter 12/05/2017   Microalbuminuria due to type 2 diabetes mellitus (HCC) 11/26/2017   Encounter for preventive health examination 11/26/2017   Menopause syndrome 06/10/2017   Adverse reaction to vaccine, sequela 03/25/2017   Atypical chest pain 02/27/2017   PSVT (paroxysmal supraventricular tachycardia) (HCC) 11/26/2016    Nontraumatic incomplete tear of right rotator cuff 07/14/2016   AC (acromioclavicular) arthritis 07/14/2016   Impingement syndrome of right shoulder 07/14/2016   Fibrocystic breast changes, right 05/21/2016   Insomnia 10/16/2015   Snoring 07/26/2015   Chronic migraine w/o aura w/o status migrainosus, not intractable 02/24/2015   History of concussion 07/21/2014   Hyperlipidemia associated with type 2 diabetes mellitus (HCC) 06/13/2014   Vitamin D deficiency 05/07/2013   Chronic migraine 11/20/2012   S/P Total Abdominal Hysterectomy and Left Salpingo-oophorectomy 10/20/2011   Headache around the eyes    Essential hypertension 07/20/2011    ONSET DATE: Pain and paresthesia in the right distal extremity and hand after second shoulder surgery in October 2024  REFERRING DIAG: R20.0 (ICD-10-CM) - Right arm numbness   THERAPY DIAG:  Paresthesia of skin  Pain in right hand  Stiffness of right wrist, not elsewhere classified  Rationale for Evaluation and Treatment: Rehabilitation  PERTINENT HISTORY: Right wrist carpal tunnel syndrome Right cubital tunnel syndrome  PRECAUTIONS: None;  RED FLAGS:  None   WEIGHT BEARING RESTRICTIONS: No   SUBJECTIVE:   SUBJECTIVE STATEMENT:  She arrives late, states she has some migraines and a rough patch recently. Enjoys new orthosis- no numbness at night, but states as soon as she takes it off, "it all comes back."   She states squeezing yellow t-putty at home to strengthen hand.     PAIN:  Are you having pain? Yes: NPRS scale: 5/10 tingling/numb now at rest Pain location: Right hand digits 3 through 5 mostly, hypersensitivity in the dorsum of the hand and occasional numbness in digits 1 and 2. Pain description: Shooting, burning, nerve pain Aggravating factors: Movement at the shoulder, weightbearing, etc. Relieving factors: Unknown   PATIENT GOALS: Have less pain and paresthesia in the right hand  NEXT MD VISIT: 10/16/2023 (with Dr.  Denese Killings)   OBJECTIVE: (All objective assessments below are from initial evaluation on: 08/09/23 unless otherwise specified.)   HAND DOMINANCE: Right   ADLs: Overall ADLs: States decreased ability to grab, hold household objects, pain and difficulty to open containers, perform FMS tasks (manipulate fasteners on clothing), mild to moderate bathing problems as well.    FUNCTIONAL OUTCOME MEASURES: Eval: Patient Specific Functional Scale: 3 (hair care, IADLS, driving)  (Higher Score  =  Better Ability for the Selected Tasks)      UPPER EXTREMITY ROM     Shoulder to Wrist AROM Right eval Rt 08/14/23 Rt 08/25/23 Rt 09/01/23  Shoulder flexion      Shoulder abduction      Shoulder extension      Shoulder internal rotation      Shoulder external rotation      Elbow flexion 121 122 137 144  Elbow extension (-10)  (-16)  (-20) (-17)  Forearm supination 75  53 57 75  Forearm pronation  90  90 90  Wrist flexion 35 57 35 53  Wrist extension 74 68 75 73  (Blank rows = not tested)   Hand AROM Right eval  Full Fist Ability (or Gap to Distal Palmar Crease) full  Thumb Opposition  (Kapandji Scale)  8/10  (Blank rows = not tested)   UPPER EXTREMITY MMT:     MMT Right Evaluation Left Evaluation  Shoulder flexion    Shoulder abduction    Shoulder adduction    Shoulder extension    Shoulder internal rotation    Shoulder external rotation    Middle trapezius    Lower trapezius    Elbow flexion    Elbow extension    Forearm supination 4+/5 5/5  Forearm pronation 4+/5 5/5  Wrist flexion 4-/5 5/5  Wrist extension 4+/5 5/5  (Blank rows = not tested)  HAND FUNCTION: 09/01/23: Grip: Rt: 41#   Eval: Observed weakness in affected Rt hand.  Grip strength Right: 33 lbs, Left: 77 lbs    COORDINATION: Eval: No coordination deficits for Rt hand FMS, but GMS at the shoulder are definitely off.  9 Hole Peg Test Right: 24sec, Left: 22 sec (26 sec is WFL)   SENSATION: Eval:  Light touch  intact today, though complaints of paresthesia and burning shooting pains at times in the right hand going up the arm.  2PDT:   Lt IF, SF both 4mm (WNL)  Rt IF: 5mm; thumb: inconsistent answers between 37mm-9mm; SF: 5mm (4-60mm is within functional limits for sensation testing)   OBSERVATIONS:   09/01/23:  full, normal PROM noted with her exercise performance today, which is a big improvement from the start of care.  TODAY'S TREATMENT:  09/01/23: She starts with active range of motion for new measures showing improvements at the elbow, forearm, wrist and hand.  She now has good, functional motion in all planes of motion from the elbow to the hand, though still had a bit of tightness.  She was led to her exercise program as it seems to be working well, and OT notes that she has full passive range of motion today with stretches and they do not appear to hurt her.  OT also has her perform dynamic movements today then incorporate multiple exercises into single fluid motions now.  She states no pain during stretching.  OT did use manual therapy percussion tool to help loosen the forearm before stretching, and moist heat for 3 minutes while OT reviewed her home exercises with her.  She states these things help.  She will be leaving for a beach trip this weekend, and for her self-care/safety, OT reminds her to do no significant lifting pushing or pulling of luggage with the right arm so that she does not cause an exacerbation of her symptoms.  When working on ulnar nerve gliding today, she is able to upgrade this to have more shoulder abduction and extension rather than doing in an adducted position-again showing improvements throughout the arm.  Unfortunately, she still rates her numbness and pain the same as prior sessions, but all of her active and passive range of motion are greatly improved, so this should follow suit as would be typical.  It is also typical for nerves to take longer to heal and she was informed  of this.    She states "I love the brace you made me, and it's working very well and does not need adjustment."  PATIENT EDUCATION: Education details:  See tx section above for details  Person educated: Patient Education method: Verbal Instruction, Teach back, Handouts  Education comprehension: States and demonstrates understanding, Additional Education required    HOME EXERCISE PROGRAM: Access Code: [email or web address] URL: [email or web address] Date: 08/09/2023 Prepared by: Fannie Knee   GOALS: Goals reviewed with patient? Yes   SHORT TERM GOALS: (STG required if POC>30 days) Target Date: 08/25/2023  Pt will obtain protective, custom orthotic. Goal status: 08/25/2023: Met  2.  Pt will demo/state understanding of initial HEP to improve pain levels and prerequisite motion. Goal status: 09/01/2023: Met   LONG TERM GOALS: Target Date: 09/22/23  Pt will improve functional ability by decreased impairment per PSFS assessment from 3 to 6 or better, for better quality of life. Goal status: INITIAL  2.  Pt will improve grip strength in right hand from 33 lbs to at least 50 lbs for functional use at home and in IADLs. Goal status: INITIAL  3.  Pt will improve A/ROM in right wrist flexion from 35 degrees to at least 60 degrees, to have functional motion for tasks like reach and grasp.  Goal status: INITIAL  4.  Pt will improve strength in right wrist flexion from 4 -/5 MMT to at least 4+/5 MMT to have increased functional ability to carry out selfcare and higher-level homecare tasks with less difficulty. Goal status: INITIAL  5.  Pt will decrease pain/paresthesia complaints at worst from 7-8/10 to 4/10 or better to have better sleep and occupational participation in daily roles. Goal status: INITIAL   ASSESSMENT:  CLINICAL IMPRESSION: 09/01/23: She is within normal limit motion now actively and passively has full robust motion from the elbow down to the hand.  This shows  excellent improvement, however she still rates her pain symptoms the same.  Nerves do take somewhat longer to heal, but pain symptoms should be resolving as motion is now normal.  OT does not want to work on strengthening until her reported pain symptoms are somewhat resolved as this could exacerbate her pain complaints.    PLAN:  OT FREQUENCY: 1x/week  OT DURATION: 6 weeks through 09/22/2023 and up to 8 total visits as needed  PLANNED INTERVENTIONS: 97168 OT Re-evaluation, 97535 self care/ADL training, 78295 therapeutic exercise, 97530 therapeutic activity, 97112 neuromuscular re-education, 97140 manual therapy, 97039 fluidotherapy, 97010 moist heat, 97010 cryotherapy, 97760 Orthotics management and training, 62130 Splinting (initial encounter), [phone] Subsequent splinting/medication, Dry needling, coping strategies training, patient/family education, and DME and/or AE instructions  CONSULTED AND AGREED WITH PLAN OF CARE: Patient  PLAN FOR NEXT SESSION:   Continue to monitor range of motion, any complaints of tightness, and work more on strengthening when pain is tolerable and low.  Fannie Knee, OTR/L, CHT 09/01/2023, 10:00 AM

## 2023-09-01 ENCOUNTER — Encounter: Payer: Self-pay | Admitting: Physical Therapy

## 2023-09-01 ENCOUNTER — Ambulatory Visit: Admitting: Rehabilitative and Restorative Service Providers"

## 2023-09-01 ENCOUNTER — Ambulatory Visit: Admitting: Physical Therapy

## 2023-09-01 ENCOUNTER — Encounter: Payer: Self-pay | Admitting: Rehabilitative and Restorative Service Providers"

## 2023-09-01 DIAGNOSIS — M25631 Stiffness of right wrist, not elsewhere classified: Secondary | ICD-10-CM | POA: Diagnosis not present

## 2023-09-01 DIAGNOSIS — M25611 Stiffness of right shoulder, not elsewhere classified: Secondary | ICD-10-CM

## 2023-09-01 DIAGNOSIS — M542 Cervicalgia: Secondary | ICD-10-CM | POA: Diagnosis not present

## 2023-09-01 DIAGNOSIS — R202 Paresthesia of skin: Secondary | ICD-10-CM

## 2023-09-01 DIAGNOSIS — M79641 Pain in right hand: Secondary | ICD-10-CM

## 2023-09-01 DIAGNOSIS — M6281 Muscle weakness (generalized): Secondary | ICD-10-CM

## 2023-09-01 DIAGNOSIS — G8929 Other chronic pain: Secondary | ICD-10-CM | POA: Diagnosis not present

## 2023-09-01 DIAGNOSIS — M25511 Pain in right shoulder: Secondary | ICD-10-CM

## 2023-09-01 NOTE — Therapy (Signed)
 OUTPATIENT PHYSICAL THERAPY SHOULDER TREATMENT   Patient Name: [name] MRN: [id number] DOB:Sep 03, 1970, 53 y.o., female Today's Date: 09/01/2023  END OF SESSION:  PT End of Session - 09/01/23 0840     Visit Number 16    Number of Visits 24    Date for PT Re-Evaluation 10/17/23    Authorization Type Lanetta Inch 2024    Authorization Time Period 25 PT/OT    PT Start Time [phone]    PT Stop Time 0920    PT Time Calculation (min) 39 min    Activity Tolerance Patient tolerated treatment well    Behavior During Therapy [facility] for tasks assessed/performed                   Past Medical History:  Diagnosis Date   Allergy    Anemia    Anxiety    claustrophobic   Asthma    Back pain    Biceps tendonosis of right shoulder    Cataract    Mixed OU   COVID-19    covid PNA hospitalized 2020, 06/06/21   Diabetes mellitus 2011   did not start metforfin, losing weight   Difficult intubation    tooth got chipped one time   Dyspnea    Essential hypertension    Fatty liver    Food allergy    Frozen shoulder    left, had steroid injection 10/05/21   Headache disorder    History of concussion    HLD (hyperlipidemia)    Hypertensive retinopathy    OU   IBS (irritable bowel syndrome)    Infertility, female    Joint pain    Lactose intolerance    Leg edema    Migraines    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   Palpitation    Post-menopausal    Seborrheic dermatitis    Shoulder impingement syndrome, right    Type 2 diabetes mellitus with complication, with long-term current use of insulin (HCC) 2018   Uveitis    [facility] in 2020 seen specialist in [place] [facility] last seen 11 or 05/2020   Vitamin D deficiency    Past Surgical History:  Procedure Laterality Date   ABDOMINAL HYSTERECTOMY  2006   heavy menses, endometriosis, l oophrectomy   BREAST BIOPSY Right 2018   benign   BREAST EXCISIONAL BIOPSY Right 2003   benign   BREAST EXCISIONAL  BIOPSY Right 1999   benign   COLONOSCOPY  10/06/2020   INGUINAL HERNIA REPAIR Left 2003   LEFT OOPHORECTOMY  2006   LUMBAR FUSION  2005   L5-S1   SHOULDER ARTHROSCOPY WITH ROTATOR CUFF REPAIR AND SUBACROMIAL DECOMPRESSION  04/06/2023   Procedure: SHOULDER ARTHROSCOPY WITH ROTATOR CUFF REPAIR , EXTENSIVE DEBRIDEMENT, AND SUBACROMIAL DECOMPRESSION;  Surgeon: Huel Cote, MD;  Location: [facility];  Service: Orthopedics;;   SHOULDER ARTHROSCOPY WITH SUBACROMIAL DECOMPRESSION AND OPEN ROTATOR C Right 07/14/2016   Procedure: RIGHT SHOULDER ARTHROSCOPY WITH SUBACROMIAL DECOMPRESSION, DISTAL CLAVICLE RESECTION AND MINI OPEN ROTATOR CUFF REPAIR, OPEN BICEP TENDODESIS;  Surgeon: Valeria Batman, MD;  Location: [facility];  Service: Orthopedics;  Laterality: Right;   SHOULDER CLOSED REDUCTION Right 09/08/2016   Procedure: RIGHT CLOSED MANIPULATION SHOULDER;  Surgeon: Valeria Batman, MD;  Location: [facility];  Service: Orthopedics;  Laterality: Right;   TUBAL LIGATION     UPPER GASTROINTESTINAL ENDOSCOPY  10/06/2020   Patient Active Problem List   Diagnosis Date Noted  Traumatic incomplete tear of right rotator cuff 04/06/2023   Vertigo 07/03/2022   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 09/21/2021   Chronic left shoulder pain 09/21/2021   Fatty infiltration of liver 07/26/2020   Diarrhea, functional 06/22/2020   Uveitis, intermediate, bilateral 09/07/2019   Generalized edema 06/13/2019   Hospital discharge follow-up 06/07/2019   Multinodular goiter 12/05/2017   Microalbuminuria due to type 2 diabetes mellitus (HCC) 11/26/2017   Encounter for preventive health examination 11/26/2017   Menopause syndrome 06/10/2017   Adverse reaction to vaccine, sequela 03/25/2017   Atypical chest pain 02/27/2017   PSVT (paroxysmal supraventricular tachycardia) (HCC) 11/26/2016   Nontraumatic incomplete tear of right rotator cuff 07/14/2016   AC  (acromioclavicular) arthritis 07/14/2016   Impingement syndrome of right shoulder 07/14/2016   Fibrocystic breast changes, right 05/21/2016   Insomnia 10/16/2015   Snoring 07/26/2015   Chronic migraine w/o aura w/o status migrainosus, not intractable 02/24/2015   History of concussion 07/21/2014   Hyperlipidemia associated with type 2 diabetes mellitus (HCC) 06/13/2014   Vitamin D deficiency 05/07/2013   Chronic migraine 11/20/2012   S/P Total Abdominal Hysterectomy and Left Salpingo-oophorectomy 10/20/2011   Headache around the eyes    Essential hypertension 07/20/2011    PCP: Dr. Duncan Dull   REFERRING PROVIDER: Dr. Huel Cote   REFERRING DIAG: Right Rotator Cuff Repair   THERAPY DIAG:  Muscle weakness (generalized)  Acute pain of right shoulder  Cervicalgia  Chronic right shoulder pain  Stiffness of right shoulder, not elsewhere classified  Rationale for Evaluation and Treatment: Rehabilitation  ONSET DATE: 04/06/23  SUBJECTIVE:                                                                                                                                                                                      SUBJECTIVE STATEMENT:   Having a lot of spasms and tightness; reporting shocks and "lightning bolts" going up her arm   Eval: Pt is here for continuation of PT for the shoulder post op but has severe R UE nerve pain. Pt notes that NCV study in the chart is inaccurate. Done in January of 2025 by Dr. Alvester Morin. Pt did get an OT referral for the cubital tunnel and carpal tunnel. Pt does WFH at a laptop for occupation. Last 3 digits will have NT. Pt does note dropping items and hand weakness. Turning the neck will cause R UE pain. Pt must turn trunk in order to look over shoulder when driving. Pt notes that NT the Sunday after surgery. Pt has a history of some NT prior and had NCV in 2019 as well.  PERTINENT HISTORY:  Previous RC repair on R side  PAIN:  Are  you having pain? Yes: NPRS scale: 4-5 when I came in, right now 5/10 Pain location: ant R shoulder and radiates down into last 3 digits Pain description: Achy and sharp, tingling,shooting  Aggravating factors: Driving Relieving factors: Gabapentin   PRECAUTIONS: RC repair  RED FLAGS: None   WEIGHT BEARING RESTRICTIONS: No  FALLS:  Has patient fallen in last 6 months? No  LIVING ENVIRONMENT:  Lives with family- college aged daughter Stairs No AD usage    OCCUPATION: Arts development officer   PLOF: Independent  PATIENT GOALS: Return to using right shoulder for functional activities like lifting objects and reaching overhead    OBJECTIVE:  Note: Objective measures were completed at Evaluation unless otherwise noted.   DIAGNOSTIC FINDINGS:  Discs: Disc spaces are maintained.   C2-3: No significant disc bulge. No neural foraminal stenosis. No central canal stenosis.   C3-4: No disc protrusion. Mild bilateral foraminal stenosis. No central canal stenosis.   C4-5: No disc protrusion. No right foraminal stenosis. Mild left foraminal stenosis. No central canal stenosis.   C5-6: Small central disc protrusion. No foraminal or central canal stenosis.   C6-7: Minimal disc bulge. Mild bilateral foraminal narrowing. No central canal stenosis.   C7-T1: No disc protrusion. Mild bilateral foraminal stenosis. No central canal stenosis.   IMPRESSION: 1. Mild cervical spine spondylosis as described above. 2. No acute osseous injury of the cervical spine.    PATIENT SURVEYS:  FOTO 40 with target of 60   COGNITION: Overall cognitive status: Within functional limits for tasks assessed     SENSATION: Light touch: Impaired  numbness and tingling down shoulder into right palm   POSTURE: Rounded shoulders; R UE guarding position with elbow flexion  CERVICAL ROM: painful into R side of neck and   Active ROM A/PROM (deg) eval  Flexion 50% R deviation  Extension 20% R  deviation  Right lateral flexion 0%  Left lateral flexion 0%  Right rotation 0%  Left rotation 0%   (Blank rows = not tested)   UPPER EXTREMITY ROM:   Active/ ROM Right eval  Shoulder flexion 80  Shoulder extension   Shoulder abduction 90  Shoulder adduction   Shoulder internal rotation To belly  Shoulder external rotation 30  Elbow flexion 125  Elbow extension -20  Wrist flexion 20  Wrist extension 50  Wrist ulnar deviation   Wrist radial deviation   Wrist pronation   Wrist supination   (Blank rows = not tested)   UPPER EXTREMITY MMT:  MMT Right eval  Shoulder flexion 3-  Shoulder extension   Shoulder abduction 3-   Shoulder adduction   Shoulder internal rotation 4  Shoulder external rotation 3-  Grip strength (lbs) For next visit  (Blank rows = not tested)   Reflexes 1+ C7 2+ C6   (+) Distraction (-) Spurling (painful regardless of over pressure or position) (-) ER Lag  C4-T1 myotomal weakness on R    PALPATION:  Hypersensitivity and TTP of R UT, levator, pec minor, biceps, deltoid, supra   TODAY'S TREATMENT:              09/01/23 TherEx Supine chin tucks 10 x 5 sec hold Supine cervical rotation 10 x 5 sec hold bil Supine active shoulder flexion with elbow flexed x 10 reps Seated scapular retraction 10 x 5 sec hold Shoulder rolls backwards x 20 reps  Neuro Re Ed Educated on pain  neuroscience in regards to sensitivity Seated bil chest press for Rt shoulder movement training x20 reps Seated bil shoulder ER with scapular retraction x 20 reps; bil for Rt shoulder feedback   Manual STM and IASTM to Rt shoulder and upper trap; light to moderate pressure for desensitization techniques  Self Care Educated on home desensitization techniques  08/25/23 Chin tucks 12x3 seconds supine  Supine chin tucks + rotation x10  R UT stretch 2x30 seconds PT overpressure R levator stretch 2x30 seconds gentle PT overpressure Supine AAROM 1# rod x10 (very  limited ROM only able to get to about 45* R UE) Supine chest presses 1# rod x10   Standing forward/backward ball rolls on table with WB/pressure as tolerated x15  Standing R/L ball rolls on table with WB/pressure as tolerated for cross midline ABD/ADD x15  Standing shoulder ABD with ball on table with WB/pressure as tolerated for gentle ROM x15    Suboccipital release x3 rounds Gentle cervical traction x3 rounds      08/17/23 Supine chin tuck with rotation 10x Supine chin tuck with rotation 2x10 Supine chin nod 2x10 Pulley ABD 20x (pt selected sets and reps)  Table biceps stretch 10s 2x4  1lb bicep curl 20x (pt selected sets and reps)  Table ball ABC 1x  Table ball scap depression 20x                                                                                                                                PATIENT EDUCATION: Education details: rationale for interventions, HEP  Person educated: Patient Education method: Explanation, Demonstration, Tactile cues, Verbal cues Education comprehension: verbalized understanding, returned demonstration, verbal cues required, tactile cues required, and needs further education     HOME EXERCISE PROGRAM: Access Code: [email or web address] URL: [email or web address] Date: 08/15/2023 Prepared by: Fransisco Hertz  Exercises - Standing Scapular Retraction  - 3-4 x daily - 1 sets - 10 reps - Seated Shoulder Flexion Towel Slide at Table Top  - 3-4 x daily - 1 sets - 5 reps - Standing Isometric Cervical Retraction with Chin Tucks and Ball at [employer]  - 3-4 x daily - 1 sets - 5 reps - Seated Shoulder Abduction Towel Slide at Table Top  - 3-4 x daily - 1 sets - 5 reps  ASSESSMENT:  CLINICAL IMPRESSION:  09/01/2023  Pt tolerated session today with fair tolerance; and able to tolerate more active movement of Rt shoulder.  Did discuss some desensitization techniques with pt today.  Will continue to benefit from PT to maximize  function.   Eval: Patient is a 53 y.o. female who was seen today for physical therapy evaluation and treatment s/p RC repair on 10/31. Therapy paused previously due to R sided radicular pain. Pt's s/s appear consistent with potential R cervical radiculopathy. A component of TOS is also on differential diagnosis list. Pt does have fatiguing weakness through C4-T1 myotomes as well as history of cubital and carpal  tunnel syndromes. Pt's cervical ROM is highly limited despite normal appear MRI report. Pt's pain is extremely sensitive and irritable with movement. Pt's R UE and neck muscle spasm and guarding are limiting factors to her R shoulder rotator cuff rehab program. Plan to prioritize reduction of R upper quarter pain and muscle spasm at future sessions and introduce R shoulder strengthening and ROM as tolerated. Pt also to have OT consult at a future visit. Pt would benefit from continued skilled therapy in order to reach goals and maximize functional R UE strength and ROM for full return to PLOF. [name]     OBJECTIVE IMPAIRMENTS: decreased ROM, decreased strength, impaired sensation, impaired UE functional use, and pain.   ACTIVITY LIMITATIONS: carrying, lifting, reach over head, and caring for others  PARTICIPATION LIMITATIONS: meal prep, cleaning, laundry, driving, shopping, community activity, and occupation  PERSONAL FACTORS: Fitness, Past/current experiences, Time since onset of injury/illness/exacerbation, and 1-2 comorbidities:    are also affecting patient's functional outcome.   REHAB POTENTIAL: Fair  CLINICAL DECISION MAKING: unstable/complicated  EVALUATION COMPLEXITY: moderate   GOALS: Goals reviewed with patient? No  SHORT TERM GOALS: Target date: 04/25/2023  PT reviewed the following HEP with patient with patient able to demonstrate a set of the following with min cuing for correction needed. PT educated patient on parameters of therex (how/when to inc/decrease intensity,  frequency, rep/set range, stretch hold time, and purpose of therex) with verbalized understanding.  Baseline: NT 04/17/23: Performing independently  Goal status: ONGOING   2.  Patient will demonstrate right shoulder PROM flexion to >=100 degrees and abduction to >=90 degrees as evidence of tissue healing.  Baseline: Shoulder Flex R/L NT/180, Shoulder Abd R/L NT/180  Goal status: ONGOING      LONG TERM GOALS: Target date: 10/17/2023   Patient will have improved function and activity level as evidenced by an increase in FOTO score by 10 points or more.  Baseline: 40  Goal status: ONGOING   2.  Patient will improve right shoulder AROM to be symmetrical with left shoulder AROM for improved RUE function to perform reaching tasks like grabbing objects off a shelf.  Baseline: Shoulder Flex R/L NT/180, Shoulder Abd R/L NT/180  Goal status: ONGOING   3.  Patient will improve right shoulder strength to  Baseline:  Shoulder Flex R/L NT/5, Shoulder Abd R/L NT/5, Shoulder ER R/L NT/NT, Shoulder IR R/L NT/NT  Goal status: ONGOING    PLAN:  PT FREQUENCY: 1-2x/week  PT DURATION: 12 wks  PLANNED INTERVENTIONS: 97164- PT Re-evaluation, 97110-Therapeutic exercises, 97530- Therapeutic activity, 97112- Neuromuscular re-education, 97535- Self Care, 40981- Manual therapy, 97014- Electrical stimulation (unattended), [phone]- Electrical stimulation (manual), Patient/Family education, Taping, Dry Needling, Joint mobilization, Joint manipulation, Spinal manipulation, Spinal mobilization, Scar mobilization, DME instructions, Cryotherapy, and Moist heat  PLAN FOR NEXT SESSION: contine cervical retraction with or without manual traction; review nerve gliding, Rcervical ROM focus, pain management in cervical and R upper quarter as pain is limiting recovery from most recent surgery   Clarita Crane, PT, DPT 09/01/23 9:27 AM

## 2023-09-01 NOTE — Telephone Encounter (Signed)
 Signature was missing placed it back in quick sign folder.

## 2023-09-04 ENCOUNTER — Encounter (HOSPITAL_BASED_OUTPATIENT_CLINIC_OR_DEPARTMENT_OTHER): Payer: Commercial Managed Care - PPO | Admitting: Physical Therapy

## 2023-09-05 ENCOUNTER — Other Ambulatory Visit: Payer: Self-pay

## 2023-09-05 ENCOUNTER — Encounter (HOSPITAL_COMMUNITY): Payer: Self-pay

## 2023-09-05 ENCOUNTER — Other Ambulatory Visit: Payer: Self-pay | Admitting: Internal Medicine

## 2023-09-05 ENCOUNTER — Other Ambulatory Visit (HOSPITAL_COMMUNITY): Payer: Self-pay

## 2023-09-05 MED ORDER — METOPROLOL SUCCINATE ER 25 MG PO TB24
12.5000 mg | ORAL_TABLET | Freq: Every day | ORAL | 0 refills | Status: DC
  Filled 2023-09-05: qty 45, 90d supply, fill #0

## 2023-09-06 ENCOUNTER — Encounter (HOSPITAL_BASED_OUTPATIENT_CLINIC_OR_DEPARTMENT_OTHER): Payer: Commercial Managed Care - PPO | Admitting: Physical Therapy

## 2023-09-08 ENCOUNTER — Encounter: Admitting: Rehabilitative and Restorative Service Providers"

## 2023-09-08 ENCOUNTER — Encounter: Admitting: Physical Therapy

## 2023-09-12 ENCOUNTER — Ambulatory Visit: Admitting: Internal Medicine

## 2023-09-13 ENCOUNTER — Encounter (HOSPITAL_BASED_OUTPATIENT_CLINIC_OR_DEPARTMENT_OTHER): Payer: Commercial Managed Care - PPO | Admitting: Orthopaedic Surgery

## 2023-09-13 NOTE — Therapy (Signed)
 OUTPATIENT OCCUPATIONAL THERAPY TREATMENT NOTE   Patient Name: [name] MRN: [id number] DOB:05-02-1971, 53 y.o., female Today's Date: 09/13/2023  PCP: Duncan Dull, MD REFERRING PROVIDER: Samuella Cota, MD   END OF SESSION:     Past Medical History:  Diagnosis Date   Allergy    Anemia    Anxiety    claustrophobic   Asthma    Back pain    Biceps tendonosis of right shoulder    Cataract    Mixed OU   COVID-19    covid PNA hospitalized 2020, 06/06/21   Diabetes mellitus 2011   did not start metforfin, losing weight   Difficult intubation    tooth got chipped one time   Dyspnea    Essential hypertension    Fatty liver    Food allergy    Frozen shoulder    left, had steroid injection 10/05/21   Headache disorder    History of concussion    HLD (hyperlipidemia)    Hypertensive retinopathy    OU   IBS (irritable bowel syndrome)    Infertility, female    Joint pain    Lactose intolerance    Leg edema    Migraines    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   Palpitation    Post-menopausal    Seborrheic dermatitis    Shoulder impingement syndrome, right    Type 2 diabetes mellitus with complication, with long-term current use of insulin (HCC) 2018   Uveitis    [facility] in 2020 seen specialist in [place] [facility] last seen 11 or 05/2020   Vitamin D deficiency    Past Surgical History:  Procedure Laterality Date   ABDOMINAL HYSTERECTOMY  2006   heavy menses, endometriosis, l oophrectomy   BREAST BIOPSY Right 2018   benign   BREAST EXCISIONAL BIOPSY Right 2003   benign   BREAST EXCISIONAL BIOPSY Right 1999   benign   COLONOSCOPY  10/06/2020   INGUINAL HERNIA REPAIR Left 2003   LEFT OOPHORECTOMY  2006   LUMBAR FUSION  2005   L5-S1   SHOULDER ARTHROSCOPY WITH ROTATOR CUFF REPAIR AND SUBACROMIAL DECOMPRESSION  04/06/2023   Procedure: SHOULDER ARTHROSCOPY WITH ROTATOR CUFF REPAIR , EXTENSIVE DEBRIDEMENT, AND SUBACROMIAL  DECOMPRESSION;  Surgeon: Huel Cote, MD;  Location: [facility];  Service: Orthopedics;;   SHOULDER ARTHROSCOPY WITH SUBACROMIAL DECOMPRESSION AND OPEN ROTATOR C Right 07/14/2016   Procedure: RIGHT SHOULDER ARTHROSCOPY WITH SUBACROMIAL DECOMPRESSION, DISTAL CLAVICLE RESECTION AND MINI OPEN ROTATOR CUFF REPAIR, OPEN BICEP TENDODESIS;  Surgeon: Valeria Batman, MD;  Location: [facility];  Service: Orthopedics;  Laterality: Right;   SHOULDER CLOSED REDUCTION Right 09/08/2016   Procedure: RIGHT CLOSED MANIPULATION SHOULDER;  Surgeon: Valeria Batman, MD;  Location:  SURGERY CENTER;  Service: Orthopedics;  Laterality: Right;   TUBAL LIGATION     UPPER GASTROINTESTINAL ENDOSCOPY  10/06/2020   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 09/21/2021   Chronic left shoulder pain 09/21/2021   Fatty infiltration of liver 07/26/2020   Diarrhea, functional 06/22/2020   Uveitis, intermediate, bilateral 09/07/2019   Generalized edema 06/13/2019   Hospital discharge follow-up 06/07/2019   Multinodular goiter 12/05/2017   Microalbuminuria due to type 2 diabetes mellitus (HCC) 11/26/2017   Encounter for preventive health examination 11/26/2017   Menopause syndrome  06/10/2017   Adverse reaction to vaccine, sequela 03/25/2017   Atypical chest pain 02/27/2017   PSVT (paroxysmal supraventricular tachycardia) (HCC) 11/26/2016   Nontraumatic incomplete tear of right rotator cuff 07/14/2016   AC (acromioclavicular) arthritis 07/14/2016   Impingement syndrome of right shoulder 07/14/2016   Fibrocystic breast changes, right 05/21/2016   Insomnia 10/16/2015   Snoring 07/26/2015   Chronic migraine w/o aura w/o status migrainosus, not intractable 02/24/2015   History of concussion 07/21/2014   Hyperlipidemia associated with type  2 diabetes mellitus (HCC) 06/13/2014   Vitamin D deficiency 05/07/2013   Chronic migraine 11/20/2012   S/P Total Abdominal Hysterectomy and Left Salpingo-oophorectomy 10/20/2011   Headache around the eyes    Essential hypertension 07/20/2011    ONSET DATE: Pain and paresthesia in the right distal extremity and hand after second shoulder surgery in October 2024  REFERRING DIAG: R20.0 (ICD-10-CM) - Right arm numbness   THERAPY DIAG:  No diagnosis found.  Rationale for Evaluation and Treatment: Rehabilitation  PERTINENT HISTORY: Right wrist carpal tunnel syndrome Right cubital tunnel syndrome  PRECAUTIONS: None;  RED FLAGS:  None   WEIGHT BEARING RESTRICTIONS: No   SUBJECTIVE:   SUBJECTIVE STATEMENT:  She states ***  she has some migraines and a rough patch recently. Enjoys new orthosis- no numbness at night, but states as soon as she takes it off, "it all comes back."   She states squeezing yellow t-putty at home to strengthen hand.     PAIN:  Are you having pain? Yes: NPRS scale: *** 5/10 tingling/numb now at rest Pain location: Right hand digits 3 through 5 mostly, hypersensitivity in the dorsum of the hand and occasional numbness in digits 1 and 2. Pain description: Shooting, burning, nerve pain Aggravating factors: Movement at the shoulder, weightbearing, etc. Relieving factors: Unknown   PATIENT GOALS: Have less pain and paresthesia in the right hand  NEXT MD VISIT: 10/16/2023 (with Dr. Denese Killings)   OBJECTIVE: (All objective assessments below are from initial evaluation on: 08/09/23 unless otherwise specified.)   HAND DOMINANCE: Right   ADLs: Overall ADLs: States decreased ability to grab, hold household objects, pain and difficulty to open containers, perform FMS tasks (manipulate fasteners on clothing), mild to moderate bathing problems as well.    FUNCTIONAL OUTCOME MEASURES: Eval: Patient Specific Functional Scale: 3 (hair care, IADLS, driving)  (Higher  Score  =  Better Ability for the Selected Tasks)      UPPER EXTREMITY ROM     Shoulder to Wrist AROM Right eval Rt 08/14/23 Rt 08/25/23 Rt 09/01/23 Rt 09/15/23  Elbow flexion 121 122 137 144 ***  Elbow extension (-10)  (-16)  (-20) (-17) ***  Forearm supination 75 53 57 75 ***  Forearm pronation  90  90 90 ***  Wrist flexion 35 57 35 53 ***  Wrist extension 74 68 75 73 ***  (Blank rows = not tested)   Hand AROM Right eval  Full Fist Ability (or Gap to Distal Palmar Crease) full  Thumb Opposition  (Kapandji Scale)  8/10  (Blank rows = not tested)   UPPER EXTREMITY MMT:     MMT Right Evaluation Left Evaluation  Shoulder flexion    Shoulder abduction    Shoulder adduction    Shoulder extension    Shoulder internal rotation    Shoulder external rotation    Middle trapezius    Lower trapezius    Elbow flexion    Elbow extension    Forearm supination 4+/5 5/5  Forearm pronation 4+/5 5/5  Wrist flexion 4-/5 5/5  Wrist extension 4+/5 5/5  (Blank rows = not tested)  HAND FUNCTION: 09/15/23: Grip: Rt: ***#  09/01/23: Grip: Rt: 41#   Eval: Observed weakness in affected Rt hand.  Grip strength Right: 33 lbs, Left: 77 lbs    COORDINATION: Eval: No coordination deficits for Rt hand FMS, but GMS at the shoulder are definitely off.  9 Hole Peg Test Right: 24sec, Left: 22 sec (26 sec is WFL)   SENSATION: Eval:  Light touch intact today, though complaints of paresthesia and burning shooting pains at times in the right hand going up the arm.  2PDT:   Lt IF, SF both 4mm (WNL)  Rt IF: 5mm; thumb: inconsistent answers between 68mm-9mm; SF: 5mm (4-75mm is within functional limits for sensation testing)   OBSERVATIONS:   09/01/23:  full, normal PROM noted with her exercise performance today, which is a big improvement from the start of care.  TODAY'S TREATMENT:  09/13/23: *** Continue to monitor range of motion, any complaints of tightness, and work more on strengthening when  pain is tolerable and low.   09/01/23: She starts with active range of motion for new measures showing improvements at the elbow, forearm, wrist and hand.  She now has good, functional motion in all planes of motion from the elbow to the hand, though still had a bit of tightness.  She was led to her exercise program as it seems to be working well, and OT notes that she has full passive range of motion today with stretches and they do not appear to hurt her.  OT also has her perform dynamic movements today then incorporate multiple exercises into single fluid motions now.  She states no pain during stretching.  OT did use manual therapy percussion tool to help loosen the forearm before stretching, and moist heat for 3 minutes while OT reviewed her home exercises with her.  She states these things help.  She will be leaving for a beach trip this weekend, and for her self-care/safety, OT reminds her to do no significant lifting pushing or pulling of luggage with the right arm so that she does not cause an exacerbation of her symptoms.  When working on ulnar nerve gliding today, she is able to upgrade this to have more shoulder abduction and extension rather than doing in an adducted position-again showing improvements throughout the arm.  Unfortunately, she still rates her numbness and pain the same as prior sessions, but all of her active and passive range of motion are greatly improved, so this should follow suit as would be typical.  It is also typical for nerves to take longer to heal and she was informed of this.    She states "I love the brace you made me, and it's working very well and does not need adjustment."  PATIENT EDUCATION: Education details: See tx section above for details  Person educated: Patient Education method: Engineer, structural, Teach back, Handouts  Education comprehension: States and demonstrates understanding, Additional Education required    HOME EXERCISE PROGRAM: Access Code:  [email or web address] URL: [email or web address] Date: 08/09/2023 Prepared by: Fannie Knee   GOALS: Goals reviewed with patient? Yes   SHORT TERM GOALS: (STG required if POC>30 days) Target Date: 08/25/2023  Pt will obtain protective, custom orthotic. Goal status: 08/25/2023: Met  2.  Pt will demo/state understanding of initial HEP to improve pain levels and prerequisite motion. Goal status: 09/01/2023: Met   LONG TERM GOALS: Target Date:  09/22/23  Pt will improve functional ability by decreased impairment per PSFS assessment from 3 to 6 or better, for better quality of life. Goal status: INITIAL  2.  Pt will improve grip strength in right hand from 33 lbs to at least 50 lbs for functional use at home and in IADLs. Goal status: INITIAL  3.  Pt will improve A/ROM in right wrist flexion from 35 degrees to at least 60 degrees, to have functional motion for tasks like reach and grasp.  Goal status: INITIAL  4.  Pt will improve strength in right wrist flexion from 4 -/5 MMT to at least 4+/5 MMT to have increased functional ability to carry out selfcare and higher-level homecare tasks with less difficulty. Goal status: INITIAL  5.  Pt will decrease pain/paresthesia complaints at worst from 7-8/10 to 4/10 or better to have better sleep and occupational participation in daily roles. Goal status: INITIAL   ASSESSMENT:  CLINICAL IMPRESSION: 09/13/23: ***  09/01/23: She is within normal limit motion now actively and passively has full robust motion from the elbow down to the hand.  This shows excellent improvement, however she still rates her pain symptoms the same.  Nerves do take somewhat longer to heal, but pain symptoms should be resolving as motion is now normal.  OT does not want to work on strengthening until her reported pain symptoms are somewhat resolved as this could exacerbate her pain complaints.    PLAN:  OT FREQUENCY: 1x/week  OT DURATION: 6 weeks through  09/22/2023 and up to 8 total visits as needed  PLANNED INTERVENTIONS: 97168 OT Re-evaluation, 97535 self care/ADL training, 47425 therapeutic exercise, 97530 therapeutic activity, 97112 neuromuscular re-education, 97140 manual therapy, 97039 fluidotherapy, 97010 moist heat, 97010 cryotherapy, 97760 Orthotics management and training, 95638 Splinting (initial encounter), [phone] Subsequent splinting/medication, Dry needling, coping strategies training, patient/family education, and DME and/or AE instructions  CONSULTED AND AGREED WITH PLAN OF CARE: Patient  PLAN FOR NEXT SESSION:   ***   Fannie Knee, OTR/L, CHT 09/13/2023, 1:18 PM

## 2023-09-15 ENCOUNTER — Ambulatory Visit: Admitting: Physical Therapy

## 2023-09-15 ENCOUNTER — Encounter: Payer: Self-pay | Admitting: Rehabilitative and Restorative Service Providers"

## 2023-09-15 ENCOUNTER — Ambulatory Visit: Admitting: Rehabilitative and Restorative Service Providers"

## 2023-09-15 ENCOUNTER — Encounter: Payer: Self-pay | Admitting: Physical Therapy

## 2023-09-15 DIAGNOSIS — M25611 Stiffness of right shoulder, not elsewhere classified: Secondary | ICD-10-CM

## 2023-09-15 DIAGNOSIS — M25631 Stiffness of right wrist, not elsewhere classified: Secondary | ICD-10-CM | POA: Diagnosis not present

## 2023-09-15 DIAGNOSIS — G8929 Other chronic pain: Secondary | ICD-10-CM

## 2023-09-15 DIAGNOSIS — M6281 Muscle weakness (generalized): Secondary | ICD-10-CM

## 2023-09-15 DIAGNOSIS — M25511 Pain in right shoulder: Secondary | ICD-10-CM | POA: Diagnosis not present

## 2023-09-15 DIAGNOSIS — R202 Paresthesia of skin: Secondary | ICD-10-CM

## 2023-09-15 DIAGNOSIS — M79641 Pain in right hand: Secondary | ICD-10-CM

## 2023-09-15 DIAGNOSIS — M542 Cervicalgia: Secondary | ICD-10-CM | POA: Diagnosis not present

## 2023-09-15 NOTE — Therapy (Signed)
 OUTPATIENT PHYSICAL THERAPY SHOULDER TREATMENT   Patient Name: [name] MRN: [id number] DOB:[birth year], 53 y.o., female Today's Date: 09/15/2023  END OF SESSION:  PT End of Session - 09/15/23 0844     Visit Number 17    Number of Visits 24    Date for PT Re-Evaluation 10/17/23    Authorization Type Lanetta Inch 2024    Authorization Time Period 25 PT/OT    PT Start Time 0845    PT Stop Time 0925    PT Time Calculation (min) 40 min    Activity Tolerance Patient tolerated treatment well    Behavior During Therapy [facility] for tasks assessed/performed                    Past Medical History:  Diagnosis Date   Allergy    Anemia    Anxiety    claustrophobic   Asthma    Back pain    Biceps tendonosis of right shoulder    Cataract    Mixed OU   COVID-19    covid PNA hospitalized 2020, 06/06/21   Diabetes mellitus 2011   did not start metforfin, losing weight   Difficult intubation    tooth got chipped one time   Dyspnea    Essential hypertension    Fatty liver    Food allergy    Frozen shoulder    left, had steroid injection 10/05/21   Headache disorder    History of concussion    HLD (hyperlipidemia)    Hypertensive retinopathy    OU   IBS (irritable bowel syndrome)    Infertility, female    Joint pain    Lactose intolerance    Leg edema    Migraines    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   Palpitation    Post-menopausal    Seborrheic dermatitis    Shoulder impingement syndrome, right    Type 2 diabetes mellitus with complication, with long-term current use of insulin (HCC) 2018   Uveitis    [place] [facility] in 2020 seen specialist in [place] [facility] last seen 11 or 05/2020   Vitamin D deficiency    Past Surgical History:  Procedure Laterality Date   ABDOMINAL HYSTERECTOMY  2006   heavy menses, endometriosis, l oophrectomy   BREAST BIOPSY Right 2018   benign   BREAST EXCISIONAL BIOPSY Right 2003   benign   BREAST  EXCISIONAL BIOPSY Right 1999   benign   COLONOSCOPY  10/06/2020   INGUINAL HERNIA REPAIR Left 2003   LEFT OOPHORECTOMY  2006   LUMBAR FUSION  2005   L5-S1   SHOULDER ARTHROSCOPY WITH ROTATOR CUFF REPAIR AND SUBACROMIAL DECOMPRESSION  04/06/2023   Procedure: SHOULDER ARTHROSCOPY WITH ROTATOR CUFF REPAIR , EXTENSIVE DEBRIDEMENT, AND SUBACROMIAL DECOMPRESSION;  Surgeon: Huel Cote, MD;  Location: [facility];  Service: Orthopedics;;   SHOULDER ARTHROSCOPY WITH SUBACROMIAL DECOMPRESSION AND OPEN ROTATOR C Right 07/14/2016   Procedure: RIGHT SHOULDER ARTHROSCOPY WITH SUBACROMIAL DECOMPRESSION, DISTAL CLAVICLE RESECTION AND MINI OPEN ROTATOR CUFF REPAIR, OPEN BICEP TENDODESIS;  Surgeon: Valeria Batman, MD;  Location: [facility];  Service: Orthopedics;  Laterality: Right;   SHOULDER CLOSED REDUCTION Right 09/08/2016   Procedure: RIGHT CLOSED MANIPULATION SHOULDER;  Surgeon: Valeria Batman, MD;  Location: [facility];  Service: Orthopedics;  Laterality: Right;   TUBAL LIGATION     UPPER GASTROINTESTINAL ENDOSCOPY  10/06/2020   Patient Active Problem List   Diagnosis Date  Noted   Traumatic incomplete tear of right rotator cuff 04/06/2023   Vertigo 07/03/2022   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 09/21/2021   Chronic left shoulder pain 09/21/2021   Fatty infiltration of liver 07/26/2020   Diarrhea, functional 06/22/2020   Uveitis, intermediate, bilateral 09/07/2019   Generalized edema 06/13/2019   Hospital discharge follow-up 06/07/2019   Multinodular goiter 12/05/2017   Microalbuminuria due to type 2 diabetes mellitus (HCC) 11/26/2017   Encounter for preventive health examination 11/26/2017   Menopause syndrome 06/10/2017   Adverse reaction to vaccine, sequela 03/25/2017   Atypical chest pain 02/27/2017   PSVT (paroxysmal supraventricular tachycardia) (HCC) 11/26/2016   Nontraumatic incomplete tear of right rotator cuff  07/14/2016   AC (acromioclavicular) arthritis 07/14/2016   Impingement syndrome of right shoulder 07/14/2016   Fibrocystic breast changes, right 05/21/2016   Insomnia 10/16/2015   Snoring 07/26/2015   Chronic migraine w/o aura w/o status migrainosus, not intractable 02/24/2015   History of concussion 07/21/2014   Hyperlipidemia associated with type 2 diabetes mellitus (HCC) 06/13/2014   Vitamin D deficiency 05/07/2013   Chronic migraine 11/20/2012   S/P Total Abdominal Hysterectomy and Left Salpingo-oophorectomy 10/20/2011   Headache around the eyes    Essential hypertension 07/20/2011    PCP: Dr. Duncan Dull   REFERRING PROVIDER: Dr. Huel Cote   REFERRING DIAG: Right Rotator Cuff Repair   THERAPY DIAG:  Muscle weakness (generalized)  Cervicalgia  Chronic right shoulder pain  Stiffness of right shoulder, not elsewhere classified  Rationale for Evaluation and Treatment: Rehabilitation  ONSET DATE: 04/06/23  SUBJECTIVE:                                                                                                                                                                                      SUBJECTIVE STATEMENT:    Had to cancel last week, nothing new except Lyrica is making me sleepy.    Eval: Pt is here for continuation of PT for the shoulder post op but has severe R UE nerve pain. Pt notes that NCV study in the chart is inaccurate. Done in January of 2025 by Dr. Alvester Morin. Pt did get an OT referral for the cubital tunnel and carpal tunnel. Pt does WFH at a laptop for occupation. Last 3 digits will have NT. Pt does note dropping items and hand weakness. Turning the neck will cause R UE pain. Pt must turn trunk in order to look over shoulder when driving. Pt notes that NT the Sunday after surgery. Pt has a history of some NT prior and had NCV in 2019 as well.     PERTINENT HISTORY:  Previous RC repair on R side  PAIN:  Are you having pain? Yes: NPRS scale:  no number given /10 Pain location: R upper trap  Pain description: just tightness R UT  Aggravating factors: driving long distances  Relieving factors: Gabapentin   PRECAUTIONS: RC repair  RED FLAGS: None   WEIGHT BEARING RESTRICTIONS: No  FALLS:  Has patient fallen in last 6 months? No  LIVING ENVIRONMENT:  Lives with family- college aged daughter Stairs No AD usage    OCCUPATION: Arts development officer   PLOF: Independent  PATIENT GOALS: Return to using right shoulder for functional activities like lifting objects and reaching overhead    OBJECTIVE:  Note: Objective measures were completed at Evaluation unless otherwise noted.   DIAGNOSTIC FINDINGS:  Discs: Disc spaces are maintained.   C2-3: No significant disc bulge. No neural foraminal stenosis. No central canal stenosis.   C3-4: No disc protrusion. Mild bilateral foraminal stenosis. No central canal stenosis.   C4-5: No disc protrusion. No right foraminal stenosis. Mild left foraminal stenosis. No central canal stenosis.   C5-6: Small central disc protrusion. No foraminal or central canal stenosis.   C6-7: Minimal disc bulge. Mild bilateral foraminal narrowing. No central canal stenosis.   C7-T1: No disc protrusion. Mild bilateral foraminal stenosis. No central canal stenosis.   IMPRESSION: 1. Mild cervical spine spondylosis as described above. 2. No acute osseous injury of the cervical spine.    PATIENT SURVEYS:  FOTO 40 with target of 60   COGNITION: Overall cognitive status: Within functional limits for tasks assessed     SENSATION: Light touch: Impaired  numbness and tingling down shoulder into right palm   POSTURE: Rounded shoulders; R UE guarding position with elbow flexion  CERVICAL ROM: painful into R side of neck and   Active ROM A/PROM (deg) eval  Flexion 50% R deviation  Extension 20% R deviation  Right lateral flexion 0%  Left lateral flexion 0%  Right rotation 0%   Left rotation 0%   (Blank rows = not tested)   UPPER EXTREMITY ROM:   Active/ ROM Right eval  Shoulder flexion 80  Shoulder extension   Shoulder abduction 90  Shoulder adduction   Shoulder internal rotation To belly  Shoulder external rotation 30  Elbow flexion 125  Elbow extension -20  Wrist flexion 20  Wrist extension 50  Wrist ulnar deviation   Wrist radial deviation   Wrist pronation   Wrist supination   (Blank rows = not tested)   UPPER EXTREMITY MMT:  MMT Right eval  Shoulder flexion 3-  Shoulder extension   Shoulder abduction 3-   Shoulder adduction   Shoulder internal rotation 4  Shoulder external rotation 3-  Grip strength (lbs) For next visit  (Blank rows = not tested)   Reflexes 1+ C7 2+ C6   (+) Distraction (-) Spurling (painful regardless of over pressure or position) (-) ER Lag  C4-T1 myotomal weakness on R    PALPATION:  Hypersensitivity and TTP of R UT, levator, pec minor, biceps, deltoid, supra   TODAY'S TREATMENT:       09/15/23  UBE all four extremities L2 x3 min forward/3 min backward  Supine shoulder flexion AAROM 1# dowel x15  UBE reacher on wall within tolerable ROM with cues to reduce UT involvement/compensations : flexion x12, scaption x12, window washing motion x12  Scap retractions for postural re-ed 15x3 seconds  Backward shoulder rolls x20   R shoulder stretching all directions to tolerance- empty end  feel, pain limited  MFR R upper traps, levator groups with excellent release of mm tension noted      09/01/23 TherEx Supine chin tucks 10 x 5 sec hold Supine cervical rotation 10 x 5 sec hold bil Supine active shoulder flexion with elbow flexed x 10 reps Seated scapular retraction 10 x 5 sec hold Shoulder rolls backwards x 20 reps  Neuro Re Ed Educated on pain neuroscience in regards to sensitivity Seated bil chest press for Rt shoulder movement training x20 reps Seated bil shoulder ER with scapular retraction  x 20 reps; bil for Rt shoulder feedback   Manual STM and IASTM to Rt shoulder and upper trap; light to moderate pressure for desensitization techniques  Self Care Educated on home desensitization techniques  08/25/23 Chin tucks 12x3 seconds supine  Supine chin tucks + rotation x10  R UT stretch 2x30 seconds PT overpressure R levator stretch 2x30 seconds gentle PT overpressure Supine AAROM 1# rod x10 (very limited ROM only able to get to about 45* R UE) Supine chest presses 1# rod x10   Standing forward/backward ball rolls on table with WB/pressure as tolerated x15  Standing R/L ball rolls on table with WB/pressure as tolerated for cross midline ABD/ADD x15  Standing shoulder ABD with ball on table with WB/pressure as tolerated for gentle ROM x15    Suboccipital release x3 rounds Gentle cervical traction x3 rounds      08/17/23 Supine chin tuck with rotation 10x Supine chin tuck with rotation 2x10 Supine chin nod 2x10 Pulley ABD 20x (pt selected sets and reps)  Table biceps stretch 10s 2x4  1lb bicep curl 20x (pt selected sets and reps)  Table ball ABC 1x  Table ball scap depression 20x                                                                                                                                PATIENT EDUCATION: Education details: rationale for interventions, HEP  Person educated: Patient Education method: Explanation, Demonstration, Tactile cues, Verbal cues Education comprehension: verbalized understanding, returned demonstration, verbal cues required, tactile cues required, and needs further education     HOME EXERCISE PROGRAM: Access Code: [email or web address] URL: [email or web address] Date: 08/15/2023 Prepared by: Fransisco Hertz  Exercises - Standing Scapular Retraction  - 3-4 x daily - 1 sets - 10 reps - Seated Shoulder Flexion Towel Slide at Table Top  - 3-4 x daily - 1 sets - 5 reps - Standing Isometric Cervical Retraction with Chin  Tucks and Ball at [employer]  - 3-4 x daily - 1 sets - 5 reps - Seated Shoulder Abduction Towel Slide at Table Top  - 3-4 x daily - 1 sets - 5 reps  ASSESSMENT:  CLINICAL IMPRESSION:  09/15/2023   Pt arrives today doing better, did really well with OT session. We increased difficulty and intensity of PT interventions today with close monitoring for worsening of sx with novel tasks. Did  OK today, continued to include manual interventions to attempt to reduce soft tissue mm spasms and tension as well as to work a bit on desensitization.     Eval: Patient is a 53 y.o. female who was seen today for physical therapy evaluation and treatment s/p RC repair on 10/31. Therapy paused previously due to R sided radicular pain. Pt's s/s appear consistent with potential R cervical radiculopathy. A component of TOS is also on differential diagnosis list. Pt does have fatiguing weakness through C4-T1 myotomes as well as history of cubital and carpal tunnel syndromes. Pt's cervical ROM is highly limited despite normal appear MRI report. Pt's pain is extremely sensitive and irritable with movement. Pt's R UE and neck muscle spasm and guarding are limiting factors to her R shoulder rotator cuff rehab program. Plan to prioritize reduction of R upper quarter pain and muscle spasm at future sessions and introduce R shoulder strengthening and ROM as tolerated. Pt also to have OT consult at a future visit. Pt would benefit from continued skilled therapy in order to reach goals and maximize functional R UE strength and ROM for full return to PLOF. [name]     OBJECTIVE IMPAIRMENTS: decreased ROM, decreased strength, impaired sensation, impaired UE functional use, and pain.   ACTIVITY LIMITATIONS: carrying, lifting, reach over head, and caring for others  PARTICIPATION LIMITATIONS: meal prep, cleaning, laundry, driving, shopping, community activity, and occupation  PERSONAL FACTORS: Fitness, Past/current experiences, Time since  onset of injury/illness/exacerbation, and 1-2 comorbidities:    are also affecting patient's functional outcome.   REHAB POTENTIAL: Fair  CLINICAL DECISION MAKING: unstable/complicated  EVALUATION COMPLEXITY: moderate   GOALS: Goals reviewed with patient? No  SHORT TERM GOALS: Target date: 04/25/2023  PT reviewed the following HEP with patient with patient able to demonstrate a set of the following with min cuing for correction needed. PT educated patient on parameters of therex (how/when to inc/decrease intensity, frequency, rep/set range, stretch hold time, and purpose of therex) with verbalized understanding.  Baseline: NT 04/17/23: Performing independently  Goal status: ONGOING   2.  Patient will demonstrate right shoulder PROM flexion to >=100 degrees and abduction to >=90 degrees as evidence of tissue healing.  Baseline: Shoulder Flex R/L NT/180, Shoulder Abd R/L NT/180  Goal status: ONGOING      LONG TERM GOALS: Target date: 10/17/2023   Patient will have improved function and activity level as evidenced by an increase in FOTO score by 10 points or more.  Baseline: 40  Goal status: ONGOING   2.  Patient will improve right shoulder AROM to be symmetrical with left shoulder AROM for improved RUE function to perform reaching tasks like grabbing objects off a shelf.  Baseline: Shoulder Flex R/L NT/180, Shoulder Abd R/L NT/180  Goal status: ONGOING   3.  Patient will improve right shoulder strength to  Baseline:  Shoulder Flex R/L NT/5, Shoulder Abd R/L NT/5, Shoulder ER R/L NT/NT, Shoulder IR R/L NT/NT  Goal status: ONGOING    PLAN:  PT FREQUENCY: 1-2x/week  PT DURATION: 12 wks  PLANNED INTERVENTIONS: 97164- PT Re-evaluation, 97110-Therapeutic exercises, 97530- Therapeutic activity, 97112- Neuromuscular re-education, 97535- Self Care, 16109- Manual therapy, 97014- Electrical stimulation (unattended), [id number]- Electrical stimulation (manual), Patient/Family education,  Taping, Dry Needling, Joint mobilization, Joint manipulation, Spinal manipulation, Spinal mobilization, Scar mobilization, DME instructions, Cryotherapy, and Moist heat  PLAN FOR NEXT SESSION: contine cervical retraction with or without manual traction; review nerve gliding, Rcervical ROM focus, pain management in cervical and R upper  quarter as pain is limiting recovery from most recent surgery. Did she feel OK after session progressions last time? Check STGs next time   Nedra Hai, PT, DPT 09/15/23 9:25 AM

## 2023-09-18 ENCOUNTER — Other Ambulatory Visit: Payer: Self-pay

## 2023-09-20 ENCOUNTER — Encounter (HOSPITAL_BASED_OUTPATIENT_CLINIC_OR_DEPARTMENT_OTHER): Payer: Commercial Managed Care - PPO | Admitting: Orthopaedic Surgery

## 2023-09-21 ENCOUNTER — Encounter: Admitting: Rehabilitative and Restorative Service Providers"

## 2023-09-29 ENCOUNTER — Ambulatory Visit (HOSPITAL_BASED_OUTPATIENT_CLINIC_OR_DEPARTMENT_OTHER): Admitting: Orthopaedic Surgery

## 2023-09-29 DIAGNOSIS — S46011A Strain of muscle(s) and tendon(s) of the rotator cuff of right shoulder, initial encounter: Secondary | ICD-10-CM

## 2023-09-29 NOTE — Progress Notes (Signed)
 Post Operative Evaluation    Procedure/Date of Surgery:  right rotator cuff repair 10/31   Interval History:   Presents today for continued status post right shoulder rotator repair recovery.  She is still having persistent pain with difficulty in overhead.  Unfortunately we have not been able to perform an injection as she has significant increases in her A1c levels following these.  She is still having quite limited overhead range of motion passively and actively. PMH/PSH/Family History/Social History/Meds/Allergies:    Past Medical History:  Diagnosis Date   Allergy    Anemia    Anxiety    claustrophobic   Asthma    Back pain    Biceps tendonosis of right shoulder    Cataract    Mixed OU   COVID-19    covid PNA hospitalized 2020, 06/06/21   Diabetes mellitus 2011   did not start metforfin, losing weight   Difficult intubation    tooth got chipped one time   Dyspnea    Essential hypertension    Fatty liver    Food allergy    Frozen shoulder    left, had steroid injection 10/05/21   Headache disorder    History of concussion    HLD (hyperlipidemia)    Hypertensive retinopathy    OU   IBS (irritable bowel syndrome)    Infertility, female    Joint pain    Lactose intolerance    Leg edema    Migraines    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   Palpitation    Post-menopausal    Seborrheic dermatitis    Shoulder impingement syndrome, right    Type 2 diabetes mellitus with complication, with long-term current use of insulin  (HCC) 2018   Uveitis    [facility] est in 2020 seen specialist in [place] [facility] last seen 11 or 05/2020   Vitamin D  deficiency    Past Surgical History:  Procedure Laterality Date   ABDOMINAL HYSTERECTOMY  2006   heavy menses, endometriosis, l oophrectomy   BREAST BIOPSY Right 2018   benign   BREAST EXCISIONAL BIOPSY Right 2003   benign   BREAST EXCISIONAL BIOPSY Right 1999   benign    COLONOSCOPY  10/06/2020   INGUINAL HERNIA REPAIR Left 2003   LEFT OOPHORECTOMY  2006   LUMBAR FUSION  2005   L5-S1   SHOULDER ARTHROSCOPY WITH ROTATOR CUFF REPAIR AND SUBACROMIAL DECOMPRESSION  04/06/2023   Procedure: SHOULDER ARTHROSCOPY WITH ROTATOR CUFF REPAIR , EXTENSIVE DEBRIDEMENT, AND SUBACROMIAL DECOMPRESSION;  Surgeon: Wilhelmenia Harada, MD;  Location: [facility];  Service: Orthopedics;;   SHOULDER ARTHROSCOPY WITH SUBACROMIAL DECOMPRESSION AND OPEN ROTATOR C Right 07/14/2016   Procedure: RIGHT SHOULDER ARTHROSCOPY WITH SUBACROMIAL DECOMPRESSION, DISTAL CLAVICLE RESECTION AND MINI OPEN ROTATOR CUFF REPAIR, OPEN BICEP TENDODESIS;  Surgeon: Shirlee Dotter, MD;  Location: [facility];  Service: Orthopedics;  Laterality: Right;   SHOULDER CLOSED REDUCTION Right 09/08/2016   Procedure: RIGHT CLOSED MANIPULATION SHOULDER;  Surgeon: Shirlee Dotter, MD;  Location: [facility];  Service: Orthopedics;  Laterality: Right;   TUBAL LIGATION     UPPER GASTROINTESTINAL ENDOSCOPY  10/06/2020   Social History   Socioeconomic History   Marital status: Divorced    Spouse name: Not on file   Number of children:  2   Years of education: 76   Highest education level: Master's degree (e.g., MA, MS, MEng, MEd, MSW, MBA)  Occupational History   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  Has a son and a daughter.   Right-handed.   1-3 cups caffeine  weekly.   Social Drivers of Corporate investment banker Strain: Not on file  Food Insecurity: Not on file  Transportation Needs: Not on file  Physical Activity: Not on  file  Stress: Not on file  Social Connections: Not on file   Family History  Problem Relation Age of Onset   Diabetes Mother    Heart disease Mother 31   Hypertension Mother    Hyperlipidemia Mother    Heart failure Mother    Stroke Mother    Thyroid  disease Mother    Depression Mother    Sleep apnea Mother    Obesity Mother    Colon polyps Mother    Cancer Father 70       Lung Cancer   Heart disease Father    Mental illness Sister        bipolar, substance abuse,  clean 2 yrs   Hypertension Sister    Cancer Paternal Grandmother 99       breast cancer   Breast cancer Paternal Grandmother    Diabetes Maternal Grandmother    Hypertension Maternal Grandmother    Diabetes Son    Colon cancer Neg Hx    Esophageal cancer Neg Hx    Rectal cancer Neg Hx    Stomach cancer Neg Hx    Allergies  Allergen Reactions   Bamlanivimab  Anaphylaxis, Itching, Palpitations, Rash, Shortness Of Breath and Swelling   Botox [Onabotulinumtoxina ] Shortness Of Breath    syncope   Clindamycin/Lincomycin Other (See Comments)   Contrast Media [Iodinated Contrast Media] Shortness Of Breath and Palpitations   Kiwi Extract Shortness Of Breath   Maxalt [Rizatriptan Benzoate] Anaphylaxis    Chest pain   Nitrous Oxide Shortness Of Breath    syncope   Rizatriptan Benzoate Anaphylaxis    Chest pain   Triptans Shortness Of Breath   Vyepti  [Eptinezumab -Jjmr] Shortness Of Breath, Palpitations and Other (See Comments)    Throat soreness, tachycardia   Aspirin  Nausea And Vomiting    Mouth blisters   Haemophilus Influenzae Vaccines    Latex     Sometimes causes rash, welts   Mango Flavoring Agent (Non-Screening) Swelling    Lips swell   Vicodin [Hydrocodone-Acetaminophen ]     hallucinations   Erythromycin Rash   Flagyl  [Metronidazole ] Rash   Influenza Virus Vaccine Palpitations   Penicillins Nausea And Vomiting, Rash and Other (See Comments)    Did it involve swelling of the face/tongue/throat,  SOB, or low BP? No Did it involve sudden or severe rash/hives, skin peeling, or any reaction on the inside of your mouth or nose? No Did you need to seek medical attention at a hospital or doctor's office? Yes When did it last happen?     patient was 53 years old  If all above answers are "NO", may proceed with cephalosporin use.   Tetracyclines & Related Nausea And Vomiting and Rash   Current Outpatient Medications  Medication Sig Dispense Refill   albuterol  (VENTOLIN  HFA) 108 (90 Base) MCG/ACT inhaler Inhale 2 puffs into the lungs every 6 (six) hours as needed for wheezing or shortness of breath. 18 g 0   aspirin  EC 81 MG tablet Take 1 tablet (81 mg total) by mouth daily. Swallow whole. 90 tablet 3   Atogepant  (QULIPTA ) 60 MG TABS Take 1 tablet (60 mg total) by mouth daily. (Patient taking differently: Take 1 tablet by mouth at bedtime.) 30 tablet 11   atorvastatin  (LIPITOR) 20 MG tablet Take 1 tablet (20 mg total) by mouth daily. (Patient taking differently: Take 20 mg by mouth at bedtime.) 90 tablet 3   butalbital -acetaminophen -caffeine  (FIORICET ) 50-325-40 MG tablet Take 1 tablet by mouth every 6 (six) hours as needed for headache. Do not refill in less than 30 day 30 tablet 5   celecoxib  (CELEBREX ) 200 MG capsule Take 1 capsule (200 mg total) by mouth 2 (two) times daily as needed for arm pain 60 capsule 2   Continuous Glucose Sensor (DEXCOM G7 SENSOR) MISC Apply 1 sensor every 10 days. 9 each 4   diazepam  (VALIUM ) 5 MG tablet Take 1 tablet (5 mg total) by mouth every 12 (twelve) hours as needed for anxiety. (Patient taking differently: Take 5 mg by mouth every 12 (twelve) hours as needed (migraines).) 30 tablet 5   dicyclomine  (BENTYL ) 20 MG tablet Take 1 tablet (20 mg total) by mouth every 6 (six) hours. (Patient taking differently: Take 20 mg by mouth 3 (three) times daily as needed (as needed).) 90 tablet 1   EPINEPHrine  0.3 mg/0.3 mL IJ SOAJ injection Inject 0.3 mg into the muscle as  needed for anaphylaxis. 2 each 0   fluconazole  (DIFLUCAN ) 150 MG tablet Take 1 tablet (150 mg total) by mouth daily. 5 tablet 0   gabapentin  (NEURONTIN ) 100 MG capsule Take 1 capsule (100 mg total) by mouth 3 (three) times daily. (Patient taking differently: Take 100 mg by mouth 3 (three) times daily. As needed) 30 capsule 1   Ipratropium-Albuterol  (COMBIVENT ) 20-100 MCG/ACT AERS respimat Inhale 1 puff into the lungs every 6 (six) hours. (Patient taking differently: Inhale 1 puff into the lungs every 6 (six) hours as needed for shortness of breath.) 4 g 11   losartan  (COZAAR ) 100 MG tablet Take 1 tablet (100 mg total) by mouth daily. 90 tablet 1   metFORMIN  (GLUCOPHAGE -XR) 500 MG 24 hr tablet Take 2 tablets (1,000 mg total) by mouth 2 times daily with a meal (at breakfast and at dinner) 360 tablet 0   metoprolol  succinate (TOPROL -XL) 25 MG 24 hr tablet Take 0.5 tablets (12.5 mg total) by mouth daily. 45 tablet 0   ondansetron  (ZOFRAN -ODT) 4 MG disintegrating tablet Take 1 tablet (4 mg total) by mouth every 8 (eight) hours as needed for nausea or vomiting. 20 tablet 0   oxyCODONE  (ROXICODONE ) 5 MG immediate release tablet Take 1 tablet (5 mg total) by mouth every 4 (four) hours as needed for severe pain (pain score 7-10) or breakthrough pain. 15 tablet 0   pantoprazole  (PROTONIX ) 40 MG tablet Take 1 tablet (40 mg total) by mouth daily. 30 tablet 3   pregabalin  (LYRICA ) 25 MG capsule Take 1 capsule (25 mg total) by mouth at bedtime. 90 capsule 3   Semaglutide , 1 MG/DOSE, (OZEMPIC , 1 MG/DOSE,) 4 MG/3ML SOPN  Inject 1 mg into the skin once a week as directed. 9 mL 1   Ubrogepant  (UBRELVY ) 100 MG TABS Take 1 tablet (100 mg total) by mouth as needed. May repeat dose in 2 hours if needed. Max dose 2 tablets in 2 hours 16 tablet 11   Vitamin D , Ergocalciferol , (DRISDOL ) 1.25 MG (50000 UNIT) CAPS capsule Take 1 capsule by mouth once a week. (Patient taking differently: Take 50,000 Units by mouth once a week.  Sunday) 4 capsule 11   No current facility-administered medications for this visit.   No results found.   Review of Systems:   A ROS was performed including pertinent positives and negatives as documented in the HPI.   Musculoskeletal Exam:    There were no vitals taken for this visit.  Right shoulder incisions are well-appearing without erythema or drainage.  She does have paresthesias down the middle finger and ring finger of the right hand.  She is able to fire wrist extensors as well as thumb extensor.  She is able to fire at the biceps.  Remainder of sensory exam is intact.  Active forward elevation is passively to approximately 100 degrees.  This is significantly painful as well as with external rotation of the side  Positive Tinel and Phalen right carpal tunnel  Imaging:     MRI cervical spine: normal  I personally reviewed and interpreted the radiographs.   Assessment:   status post right shoulder rotator cuff repair with significant pain concerning for possible capsulitis.  Unfortunately we have not been able to inject her shoulder as this causes significant increases in her blood sugar.  Given this I would like to obtain an MRI to assess the status of the repair as well as to assess for significant capsular thickening which may ultimately benefit from capsular release. Plan :    -Return to following MRI right shoulder      I personally saw and evaluated the patient, and participated in the management and treatment plan.  Wilhelmenia Harada, MD Attending Physician, Orthopedic Surgery  This document was dictated using Dragon voice recognition software. A reasonable attempt at proof reading has been made to minimize errors.

## 2023-10-02 ENCOUNTER — Encounter (HOSPITAL_BASED_OUTPATIENT_CLINIC_OR_DEPARTMENT_OTHER): Payer: Self-pay | Admitting: Orthopaedic Surgery

## 2023-10-03 ENCOUNTER — Encounter (HOSPITAL_BASED_OUTPATIENT_CLINIC_OR_DEPARTMENT_OTHER): Payer: Self-pay | Admitting: Orthopaedic Surgery

## 2023-10-03 ENCOUNTER — Encounter: Payer: Commercial Managed Care - PPO | Admitting: Physical Medicine and Rehabilitation

## 2023-10-09 ENCOUNTER — Encounter: Payer: Self-pay | Admitting: Internal Medicine

## 2023-10-09 ENCOUNTER — Other Ambulatory Visit (INDEPENDENT_AMBULATORY_CARE_PROVIDER_SITE_OTHER)

## 2023-10-09 DIAGNOSIS — E1165 Type 2 diabetes mellitus with hyperglycemia: Secondary | ICD-10-CM | POA: Diagnosis not present

## 2023-10-09 DIAGNOSIS — Z794 Long term (current) use of insulin: Secondary | ICD-10-CM | POA: Diagnosis not present

## 2023-10-09 DIAGNOSIS — I10 Essential (primary) hypertension: Secondary | ICD-10-CM

## 2023-10-09 DIAGNOSIS — R809 Proteinuria, unspecified: Secondary | ICD-10-CM | POA: Diagnosis not present

## 2023-10-09 DIAGNOSIS — E1129 Type 2 diabetes mellitus with other diabetic kidney complication: Secondary | ICD-10-CM | POA: Diagnosis not present

## 2023-10-09 LAB — COMPREHENSIVE METABOLIC PANEL WITH GFR
ALT: 12 U/L (ref 0–35)
AST: 12 U/L (ref 0–37)
Albumin: 4.4 g/dL (ref 3.5–5.2)
Alkaline Phosphatase: 131 U/L — ABNORMAL HIGH (ref 39–117)
BUN: 10 mg/dL (ref 6–23)
CO2: 26 meq/L (ref 19–32)
Calcium: 9 mg/dL (ref 8.4–10.5)
Chloride: 108 meq/L (ref 96–112)
Creatinine, Ser: 0.77 mg/dL (ref 0.40–1.20)
GFR: 87.59 mL/min (ref >60.00)
Glucose, Bld: 138 mg/dL — ABNORMAL HIGH (ref 70–99)
Potassium: 3.7 meq/L (ref 3.5–5.1)
Sodium: 143 meq/L (ref 135–145)
Total Bilirubin: 0.4 mg/dL (ref 0.2–1.2)
Total Protein: 7 g/dL (ref 6.0–8.3)

## 2023-10-09 LAB — MICROALBUMIN / CREATININE URINE RATIO
Creatinine,U: 360.9 mg/dL
Microalb Creat Ratio: 39.2 mg/g — ABNORMAL HIGH (ref 0.0–30.0)
Microalb, Ur: 14.1 mg/dL — ABNORMAL HIGH (ref 0.0–1.9)

## 2023-10-10 ENCOUNTER — Encounter: Payer: Self-pay | Admitting: Internal Medicine

## 2023-10-10 LAB — LIPID PANEL W/REFLEX DIRECT LDL
Cholesterol: 125 mg/dL (ref <200)
HDL: 43 mg/dL — ABNORMAL LOW (ref >=50)
LDL Cholesterol (Calc): 68 mg/dL
Non-HDL Cholesterol (Calc): 82 mg/dL (ref <130)
Total CHOL/HDL Ratio: 2.9 (calc) (ref <5.0)
Triglycerides: 55 mg/dL (ref <150)

## 2023-10-11 ENCOUNTER — Other Ambulatory Visit: Payer: Self-pay | Admitting: Internal Medicine

## 2023-10-11 ENCOUNTER — Other Ambulatory Visit: Payer: Self-pay

## 2023-10-11 ENCOUNTER — Other Ambulatory Visit (HOSPITAL_COMMUNITY): Payer: Self-pay

## 2023-10-11 MED ORDER — DIAZEPAM 5 MG PO TABS
5.0000 mg | ORAL_TABLET | Freq: Two times a day (BID) | ORAL | 5 refills | Status: AC | PRN
  Filled 2023-10-11 (×2): qty 30, 15d supply, fill #0
  Filled 2024-03-25: qty 30, 15d supply, fill #1

## 2023-10-11 MED ORDER — BUTALBITAL-APAP-CAFFEINE 50-325-40 MG PO TABS
1.0000 | ORAL_TABLET | Freq: Four times a day (QID) | ORAL | 5 refills | Status: AC | PRN
  Filled 2023-10-11 (×2): qty 30, 8d supply, fill #0

## 2023-10-12 ENCOUNTER — Telehealth: Payer: Self-pay | Admitting: Pharmacist

## 2023-10-12 ENCOUNTER — Other Ambulatory Visit (HOSPITAL_COMMUNITY): Payer: Self-pay

## 2023-10-12 NOTE — Telephone Encounter (Signed)
 Pharmacy Patient Advocate Encounter   Received notification from Patient Pharmacy that prior authorization for Ubrelvy  100MG  tablets is required/requested.   Insurance verification completed.   The patient is insured through [facility] .   Per test claim: PA required; PA submitted to above mentioned insurance via CoverMyMeds Key/confirmation #/EOC [id number] Status is pending

## 2023-10-12 NOTE — Telephone Encounter (Signed)
 Pharmacy Patient Advocate Encounter  Received notification from [facility] that Prior Authorization for Ubrelvy  100MG  tablets has been APPROVED from 10/12/2023 to 10/11/2024   PA #/Case ID/Reference #: [id number]

## 2023-10-13 ENCOUNTER — Ambulatory Visit
Admission: RE | Admit: 2023-10-13 | Discharge: 2023-10-13 | Disposition: A | Discharge status: Home / Self Care | Source: Ambulatory Visit | Attending: Orthopaedic Surgery

## 2023-10-13 ENCOUNTER — Other Ambulatory Visit (HOSPITAL_COMMUNITY): Payer: Self-pay

## 2023-10-13 DIAGNOSIS — M25511 Pain in right shoulder: Secondary | ICD-10-CM | POA: Diagnosis not present

## 2023-10-13 DIAGNOSIS — G8929 Other chronic pain: Secondary | ICD-10-CM | POA: Diagnosis not present

## 2023-10-13 DIAGNOSIS — Z9889 Other specified postprocedural states: Secondary | ICD-10-CM | POA: Diagnosis not present

## 2023-10-13 DIAGNOSIS — S46011A Strain of muscle(s) and tendon(s) of the rotator cuff of right shoulder, initial encounter: Secondary | ICD-10-CM

## 2023-10-14 ENCOUNTER — Other Ambulatory Visit (HOSPITAL_COMMUNITY): Payer: Self-pay

## 2023-10-16 ENCOUNTER — Ambulatory Visit: Payer: Commercial Managed Care - PPO | Admitting: Orthopedic Surgery

## 2023-10-16 DIAGNOSIS — R200 Anesthesia of skin: Secondary | ICD-10-CM | POA: Diagnosis not present

## 2023-10-16 NOTE — Progress Notes (Signed)
 [name] - 53 y.o. female MRN [id number]  Date of birth: 1970-08-08  Office Visit Note: Visit Date: 10/16/2023 PCP: Thersia Flax, MD Referred by: Thersia Flax, MD  Subjective: No chief complaint on file.  HPI: [name] is a pleasant 53 y.o. female who presents today for follow-up of numbness and tingling in the right hand, long finger through small finger.  She states that the symptoms continue to occasionally radiate up the arm towards the elbow.  States that the symptoms are worse in a bent elbow position which she is trying to avoid.  She did undergo recent EMG study which showed mild carpal tunnel, ulnar nerve velocities were appropriate.  She has trialed OT, did do some wrist bracing at night as well.  Unfortunately, her blood sugars elevated significantly with cortisone injections which she tries to avoid.  As previously known, she has a history notable for a recent right shoulder surgery by Dr. Hermina Loosen in October of last year for the right rotator cuff and labrum.  She continues to have some ongoing stiffness of the right upper extremity, there is concern for potential frozen shoulder for which she underwent recent MRI and has upcoming follow-up.      Pertinent ROS were reviewed with the patient and found to be negative unless otherwise specified above in HPI.    Assessment & Plan: Visit Diagnoses:  1. Right arm numbness      Plan: Extensive discussion was once again had with the patient today regarding her ongoing right upper extremity complaints.  Clinically, she is demonstrating signs symptoms consistent more with cubital tunnel syndrome, she is aware that the bent elbow position can exacerbate the symptoms.  Fortunately, on her electrodiagnostic study her conduction velocities are appropriate for the ulnar nerve at the elbow.  We reviewed the anatomy and pathophysiology of nerve compression both at the cubital tunnel and carpal tunnel regions.     Clinically, as mentioned her symptoms are more on the ulnar aspect of her hand, however there is involvement of the long finger which could indicate some carpal tunnel syndrome as well.  As discussed, the nerve conduction velocities are appropriate meaning that decompression may not give her significant relief from a surgical standpoint.    For the time being, given that she is in the midst of recovery from the right shoulder, she can continue with the nerve gliding exercises from occupational therapy, I did also mention that she can try reinstituting the wrist bracing at night as well periodically if it helps.  She is welcome to return to me as needed moving forward once she has cleared recovery from the right shoulder for further discussion regarding potential nerve decompression.  Follow-up: No follow-ups on file.   Meds & Orders: No orders of the defined types were placed in this encounter.   No orders of the defined types were placed in this encounter.    Procedures: No procedures performed      Clinical History: 03/23/2018  Impression: Essentially NORMAL electrodiagnostic study of the right upper limb.  The reduced amplitude on the distal motor median nerve study is likely technical artifact as the amplitude was normal when stimulated at the elbow.   There is no significant electrodiagnostic evidence of nerve entrapment, brachial plexopathy or cervical radiculopathy.     **As you know, purely sensory or demyelinating radiculopathies and chemical radiculitis may not be detected with this particular electrodiagnostic study.   Recommendations: 1.  Follow-up with  referring physician. 2.  Continue current management of symptoms.   ___________________________ Tsosie Gail Board Certified, American Board of Physical Medicine and Rehabilitation  She reports that she has never smoked. She has never used smokeless tobacco.  Recent Labs    01/17/23 1643 07/28/23 1358  HGBA1C  5.9* 6.5*    Objective:   Vital Signs: There were no vitals taken for this visit.  Physical Exam  Gen: Well-appearing, in no acute distress; non-toxic CV: Regular Rate. Well-perfused. Warm.  Resp: Breathing unlabored on room air; no wheezing. Psych: Fluid speech in conversation; appropriate affect; normal thought process  Ortho Exam PHYSICAL EXAM:  General: Patient is well appearing and in no distress.  Range of Motion and Palpation Tests: Right upper extremity Forearm supination and pronation are 65/65. Wrist flexion/extension is 75/65.  Digital flexion and extension are full.   No cords or nodules are palpated.  No triggering is observed.     Neurologic, Vascular, Motor: Right upper extremity Sensation is diminished to light touch in the right long, ring and small digits.    Thenar atrophy: Negative Tinel sign: Positive cubital tunnel, mildly positive carpal tunnel  Fingers pink and well perfused.  Capillary refill is brisk.     Lab Results  Component Value Date   HGBA1C 6.5 (A) 07/28/2023     Imaging: No results found.   Past Medical/Family/Surgical/Social History: Medications & Allergies reviewed per EMR, new medications updated. 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 09/21/2021   Chronic left shoulder pain 09/21/2021   Fatty infiltration of liver 07/26/2020   Diarrhea, functional 06/22/2020   Uveitis, intermediate, bilateral 09/07/2019   Generalized edema 06/13/2019   Hospital discharge follow-up 06/07/2019   Multinodular goiter 12/05/2017   Microalbuminuria due to type 2 diabetes mellitus (HCC) 11/26/2017   Encounter for preventive health examination 11/26/2017   Menopause syndrome 06/10/2017   Adverse reaction to vaccine, sequela 03/25/2017   Atypical chest pain 02/27/2017   PSVT  (paroxysmal supraventricular tachycardia) (HCC) 11/26/2016   Nontraumatic incomplete tear of right rotator cuff 07/14/2016   AC (acromioclavicular) arthritis 07/14/2016   Impingement syndrome of right shoulder 07/14/2016   Fibrocystic breast changes, right 05/21/2016   Insomnia 10/16/2015   Snoring 07/26/2015   Chronic migraine w/o aura w/o status migrainosus, not intractable 02/24/2015   History of concussion 07/21/2014   Hyperlipidemia associated with type 2 diabetes mellitus (HCC) 06/13/2014   Vitamin D  deficiency 05/07/2013   Chronic migraine 11/20/2012   S/P Total Abdominal Hysterectomy and Left Salpingo-oophorectomy 10/20/2011   Headache around the eyes    Essential hypertension 07/20/2011   Past Medical History:  Diagnosis Date   Allergy    Anemia    Anxiety    claustrophobic   Asthma    Back pain    Biceps tendonosis of right shoulder    Cataract    Mixed OU   COVID-19    covid PNA hospitalized 2020, 06/06/21   Diabetes mellitus 2011   did not start metforfin, losing weight   Difficult intubation    tooth got chipped one time   Dyspnea    Essential hypertension    Fatty liver    Food allergy    Frozen shoulder    left, had steroid injection 10/05/21   Headache disorder    History of concussion  HLD (hyperlipidemia)    Hypertensive retinopathy    OU   IBS (irritable bowel syndrome)    Infertility, female    Joint pain    Lactose intolerance    Leg edema    Migraines    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   Palpitation    Post-menopausal    Seborrheic dermatitis    Shoulder impingement syndrome, right    Type 2 diabetes mellitus with complication, with long-term current use of insulin  (HCC) 2018   Uveitis    [place] est in 2020 seen specialist in [place] [facility] last seen 11 or 05/2020   Vitamin D  deficiency    Family History  Problem Relation Age of Onset   Diabetes Mother    Heart disease Mother 18   Hypertension Mother     Hyperlipidemia Mother    Heart failure Mother    Stroke Mother    Thyroid  disease Mother    Depression Mother    Sleep apnea Mother    Obesity Mother    Colon polyps Mother    Cancer Father 71       Lung Cancer   Heart disease Father    Mental illness Sister        bipolar, substance abuse,  clean 2 yrs   Hypertension Sister    Cancer Paternal Grandmother 87       breast cancer   Breast cancer Paternal Grandmother    Diabetes Maternal Grandmother    Hypertension Maternal Grandmother    Diabetes Son    Colon cancer Neg Hx    Esophageal cancer Neg Hx    Rectal cancer Neg Hx    Stomach cancer Neg Hx    Past Surgical History:  Procedure Laterality Date   ABDOMINAL HYSTERECTOMY  2006   heavy menses, endometriosis, l oophrectomy   BREAST BIOPSY Right 2018   benign   BREAST EXCISIONAL BIOPSY Right 2003   benign   BREAST EXCISIONAL BIOPSY Right 1999   benign   COLONOSCOPY  10/06/2020   INGUINAL HERNIA REPAIR Left 2003   LEFT OOPHORECTOMY  2006   LUMBAR FUSION  2005   L5-S1   SHOULDER ARTHROSCOPY WITH ROTATOR CUFF REPAIR AND SUBACROMIAL DECOMPRESSION  04/06/2023   Procedure: SHOULDER ARTHROSCOPY WITH ROTATOR CUFF REPAIR , EXTENSIVE DEBRIDEMENT, AND SUBACROMIAL DECOMPRESSION;  Surgeon: Wilhelmenia Harada, MD;  Location: [facility];  Service: Orthopedics;;   SHOULDER ARTHROSCOPY WITH SUBACROMIAL DECOMPRESSION AND OPEN ROTATOR C Right 07/14/2016   Procedure: RIGHT SHOULDER ARTHROSCOPY WITH SUBACROMIAL DECOMPRESSION, DISTAL CLAVICLE RESECTION AND MINI OPEN ROTATOR CUFF REPAIR, OPEN BICEP TENDODESIS;  Surgeon: Shirlee Dotter, MD;  Location: [facility];  Service: Orthopedics;  Laterality: Right;   SHOULDER CLOSED REDUCTION Right 09/08/2016   Procedure: RIGHT CLOSED MANIPULATION SHOULDER;  Surgeon: Shirlee Dotter, MD;  Location: [facility];  Service: Orthopedics;  Laterality: Right;   TUBAL LIGATION     UPPER GASTROINTESTINAL ENDOSCOPY  10/06/2020    Social History   Occupational History   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 [facility]

## 2023-11-01 ENCOUNTER — Ambulatory Visit (HOSPITAL_BASED_OUTPATIENT_CLINIC_OR_DEPARTMENT_OTHER): Payer: Self-pay | Admitting: Orthopaedic Surgery

## 2023-11-01 ENCOUNTER — Other Ambulatory Visit (HOSPITAL_BASED_OUTPATIENT_CLINIC_OR_DEPARTMENT_OTHER): Payer: Self-pay

## 2023-11-01 ENCOUNTER — Ambulatory Visit (HOSPITAL_BASED_OUTPATIENT_CLINIC_OR_DEPARTMENT_OTHER): Admitting: Orthopaedic Surgery

## 2023-11-01 ENCOUNTER — Other Ambulatory Visit (HOSPITAL_COMMUNITY): Payer: Self-pay

## 2023-11-01 DIAGNOSIS — M7501 Adhesive capsulitis of right shoulder: Secondary | ICD-10-CM

## 2023-11-01 DIAGNOSIS — S46011A Strain of muscle(s) and tendon(s) of the rotator cuff of right shoulder, initial encounter: Secondary | ICD-10-CM | POA: Diagnosis not present

## 2023-11-01 MED ORDER — OXYCODONE HCL 5 MG PO TABS
5.0000 mg | ORAL_TABLET | ORAL | 0 refills | Status: DC | PRN
  Filled 2023-11-01: qty 20, 4d supply, fill #0

## 2023-11-01 MED ORDER — ASPIRIN 325 MG PO TBEC
325.0000 mg | DELAYED_RELEASE_TABLET | Freq: Every day | ORAL | 0 refills | Status: DC
  Filled 2023-11-01: qty 14, 14d supply, fill #0

## 2023-11-01 MED ORDER — IBUPROFEN 800 MG PO TABS
800.0000 mg | ORAL_TABLET | Freq: Three times a day (TID) | ORAL | 0 refills | Status: AC
  Filled 2023-11-01: qty 30, 10d supply, fill #0

## 2023-11-01 MED ORDER — ACETAMINOPHEN 500 MG PO TABS
500.0000 mg | ORAL_TABLET | Freq: Three times a day (TID) | ORAL | 0 refills | Status: AC
  Filled 2023-11-01: qty 30, 10d supply, fill #0

## 2023-11-01 NOTE — Progress Notes (Signed)
 Post Operative Evaluation    Procedure/Date of Surgery:  right rotator cuff repair 10/31   Interval History:   Presents today for continued status post right shoulder rotator repair recovery.  At this time she is having difficulty with persistent pain with overhead range of motion.  Unfortunately we are not able to do an ultrasound-guided injection with steroid due to her blood sugar.  PMH/PSH/Family History/Social History/Meds/Allergies:    Past Medical History:  Diagnosis Date   Allergy    Anemia    Anxiety    claustrophobic   Asthma    Back pain    Biceps tendonosis of right shoulder    Cataract    Mixed OU   COVID-19    covid PNA hospitalized 2020, 06/06/21   Diabetes mellitus 2011   did not start metforfin, losing weight   Difficult intubation    tooth got chipped one time   Dyspnea    Essential hypertension    Fatty liver    Food allergy    Frozen shoulder    left, had steroid injection 10/05/21   Headache disorder    History of concussion    HLD (hyperlipidemia)    Hypertensive retinopathy    OU   IBS (irritable bowel syndrome)    Infertility, female    Joint pain    Lactose intolerance    Leg edema    Migraines    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   Palpitation    Post-menopausal    Seborrheic dermatitis    Shoulder impingement syndrome, right    Type 2 diabetes mellitus with complication, with long-term current use of insulin  (HCC) 2018   Uveitis    [facility] est in 2020 seen specialist in [place] [facility] last seen 11 or 05/2020   Vitamin D  deficiency    Past Surgical History:  Procedure Laterality Date   ABDOMINAL HYSTERECTOMY  2006   heavy menses, endometriosis, l oophrectomy   BREAST BIOPSY Right 2018   benign   BREAST EXCISIONAL BIOPSY Right 2003   benign   BREAST EXCISIONAL BIOPSY Right 1999   benign   COLONOSCOPY  10/06/2020   INGUINAL HERNIA REPAIR Left 2003   LEFT OOPHORECTOMY   2006   LUMBAR FUSION  2005   L5-S1   SHOULDER ARTHROSCOPY WITH ROTATOR CUFF REPAIR AND SUBACROMIAL DECOMPRESSION  04/06/2023   Procedure: SHOULDER ARTHROSCOPY WITH ROTATOR CUFF REPAIR , EXTENSIVE DEBRIDEMENT, AND SUBACROMIAL DECOMPRESSION;  Surgeon: Wilhelmenia Harada, MD;  Location: [facility];  Service: Orthopedics;;   SHOULDER ARTHROSCOPY WITH SUBACROMIAL DECOMPRESSION AND OPEN ROTATOR C Right 07/14/2016   Procedure: RIGHT SHOULDER ARTHROSCOPY WITH SUBACROMIAL DECOMPRESSION, DISTAL CLAVICLE RESECTION AND MINI OPEN ROTATOR CUFF REPAIR, OPEN BICEP TENDODESIS;  Surgeon: Shirlee Dotter, MD;  Location: [facility];  Service: Orthopedics;  Laterality: Right;   SHOULDER CLOSED REDUCTION Right 09/08/2016   Procedure: RIGHT CLOSED MANIPULATION SHOULDER;  Surgeon: Shirlee Dotter, MD;  Location: [facility];  Service: Orthopedics;  Laterality: Right;   TUBAL LIGATION     UPPER GASTROINTESTINAL ENDOSCOPY  10/06/2020   Social History   Socioeconomic History   Marital status: Divorced    Spouse name: Not on file   Number of children: 2   Years of education: 14   Highest education level:  Master's degree (e.g., MA, MS, MEng, MEd, MSW, MBA)  Occupational History   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  Has a son and a daughter.   Right-handed.   1-3 cups caffeine  weekly.   Social Drivers of Corporate investment banker Strain: Not on file  Food Insecurity: Not on file  Transportation Needs: Not on file  Physical Activity: Not on file  Stress: Not on file  Social Connections: Not on file   Family History   Problem Relation Age of Onset   Diabetes Mother    Heart disease Mother 31   Hypertension Mother    Hyperlipidemia Mother    Heart failure Mother    Stroke Mother    Thyroid  disease Mother    Depression Mother    Sleep apnea Mother    Obesity Mother    Colon polyps Mother    Cancer Father 30       Lung Cancer   Heart disease Father    Mental illness Sister        bipolar, substance abuse,  clean 2 yrs   Hypertension Sister    Cancer Paternal Grandmother 45       breast cancer   Breast cancer Paternal Grandmother    Diabetes Maternal Grandmother    Hypertension Maternal Grandmother    Diabetes Son    Colon cancer Neg Hx    Esophageal cancer Neg Hx    Rectal cancer Neg Hx    Stomach cancer Neg Hx    Allergies  Allergen Reactions   Bamlanivimab  Anaphylaxis, Itching, Palpitations, Rash, Shortness Of Breath and Swelling   Botox [Onabotulinumtoxina ] Shortness Of Breath    syncope   Clindamycin/Lincomycin Other (See Comments)   Contrast Media [Iodinated Contrast Media] Shortness Of Breath and Palpitations   Kiwi Extract Shortness Of Breath   Maxalt [Rizatriptan Benzoate] Anaphylaxis    Chest pain   Nitrous Oxide Shortness Of Breath    syncope   Rizatriptan Benzoate Anaphylaxis    Chest pain   Triptans Shortness Of Breath   Vyepti  [Eptinezumab -Jjmr] Shortness Of Breath, Palpitations and Other (See Comments)    Throat soreness, tachycardia   Aspirin  Nausea And Vomiting    Mouth blisters   Haemophilus Influenzae Vaccines    Latex     Sometimes causes rash, welts   Mango Flavoring Agent (Non-Screening) Swelling    Lips swell   Vicodin [Hydrocodone-Acetaminophen ]     hallucinations   Erythromycin Rash   Flagyl  [Metronidazole ] Rash   Influenza Virus Vaccine Palpitations   Penicillins Nausea And Vomiting, Rash and Other (See Comments)    Did it involve swelling of the face/tongue/throat, SOB, or low BP? No Did it involve sudden or severe rash/hives, skin peeling, or  any reaction on the inside of your mouth or nose? No Did you need to seek medical attention at a hospital or doctor's office? Yes When did it last happen?     patient was 53 years old  If all above answers are "NO", may proceed with cephalosporin use.  Tetracyclines & Related Nausea And Vomiting and Rash   Current Outpatient Medications  Medication Sig Dispense Refill   acetaminophen  (TYLENOL ) 500 MG tablet Take 1 tablet (500 mg total) by mouth every 8 (eight) hours for 10 days. 30 tablet 0   aspirin  EC 325 MG tablet Take 1 tablet (325 mg total) by mouth daily. 14 tablet 0   ibuprofen  (ADVIL ) 800 MG tablet Take 1 tablet (800 mg total) by mouth every 8 (eight) hours for 10 days. Please take with food, please alternate with acetaminophen  30 tablet 0   oxyCODONE  (ROXICODONE ) 5 MG immediate release tablet Take 1 tablet (5 mg total) by mouth every 4 (four) hours as needed for severe pain (pain score 7-10) or breakthrough pain. 20 tablet 0   albuterol  (VENTOLIN  HFA) 108 (90 Base) MCG/ACT inhaler Inhale 2 puffs into the lungs every 6 (six) hours as needed for wheezing or shortness of breath. 18 g 0   aspirin  EC 81 MG tablet Take 1 tablet (81 mg total) by mouth daily. Swallow whole. 90 tablet 3   Atogepant  (QULIPTA ) 60 MG TABS Take 1 tablet (60 mg total) by mouth daily. (Patient taking differently: Take 1 tablet by mouth at bedtime.) 30 tablet 11   atorvastatin  (LIPITOR) 20 MG tablet Take 1 tablet (20 mg total) by mouth daily. (Patient taking differently: Take 20 mg by mouth at bedtime.) 90 tablet 3   butalbital -acetaminophen -caffeine  (FIORICET ) 50-325-40 MG tablet Take 1 tablet by mouth every 6 (six) hours as needed for headache. Do not refill in less than 30 day 30 tablet 5   celecoxib  (CELEBREX ) 200 MG capsule Take 1 capsule (200 mg total) by mouth 2 (two) times daily as needed for arm pain 60 capsule 2   Continuous Glucose Sensor (DEXCOM G7 SENSOR) MISC Apply 1 sensor every 10 days. 9 each 4    diazepam  (VALIUM ) 5 MG tablet Take 1 tablet (5 mg total) by mouth every 12 (twelve) hours as needed for anxiety. 30 tablet 5   dicyclomine  (BENTYL ) 20 MG tablet Take 1 tablet (20 mg total) by mouth every 6 (six) hours. (Patient taking differently: Take 20 mg by mouth 3 (three) times daily as needed (as needed).) 90 tablet 1   EPINEPHrine  0.3 mg/0.3 mL IJ SOAJ injection Inject 0.3 mg into the muscle as needed for anaphylaxis. 2 each 0   fluconazole  (DIFLUCAN ) 150 MG tablet Take 1 tablet (150 mg total) by mouth daily. 5 tablet 0   gabapentin  (NEURONTIN ) 100 MG capsule Take 1 capsule (100 mg total) by mouth 3 (three) times daily. (Patient taking differently: Take 100 mg by mouth 3 (three) times daily. As needed) 30 capsule 1   Ipratropium-Albuterol  (COMBIVENT ) 20-100 MCG/ACT AERS respimat Inhale 1 puff into the lungs every 6 (six) hours. (Patient taking differently: Inhale 1 puff into the lungs every 6 (six) hours as needed for shortness of breath.) 4 g 11   losartan  (COZAAR ) 100 MG tablet Take 1 tablet (100 mg total) by mouth daily. 90 tablet 1   metFORMIN  (GLUCOPHAGE -XR) 500 MG 24 hr tablet Take 2 tablets (1,000 mg total) by mouth 2 times daily with a meal (at breakfast and at dinner) 360 tablet 0   metoprolol  succinate (TOPROL -XL) 25 MG 24 hr tablet Take 0.5 tablets (12.5 mg total) by mouth daily. 45 tablet 0   ondansetron  (ZOFRAN -ODT) 4 MG disintegrating tablet Take 1 tablet (4 mg total) by mouth every 8 (eight) hours as needed for nausea or vomiting. 20 tablet 0  oxyCODONE  (ROXICODONE ) 5 MG immediate release tablet Take 1 tablet (5 mg total) by mouth every 4 (four) hours as needed for severe pain (pain score 7-10) or breakthrough pain. 15 tablet 0   pantoprazole  (PROTONIX ) 40 MG tablet Take 1 tablet (40 mg total) by mouth daily. 30 tablet 3   pregabalin  (LYRICA ) 25 MG capsule Take 1 capsule (25 mg total) by mouth at bedtime. 90 capsule 3   Semaglutide , 1 MG/DOSE, (OZEMPIC , 1 MG/DOSE,) 4 MG/3ML SOPN  Inject 1 mg into the skin once a week as directed. 9 mL 1   Ubrogepant  (UBRELVY ) 100 MG TABS Take 1 tablet (100 mg total) by mouth as needed. May repeat dose in 2 hours if needed. Max dose 2 tablets in 2 hours 16 tablet 11   Vitamin D , Ergocalciferol , (DRISDOL ) 1.25 MG (50000 UNIT) CAPS capsule Take 1 capsule by mouth once a week. (Patient taking differently: Take 50,000 Units by mouth once a week. Sunday) 4 capsule 11   No current facility-administered medications for this visit.   No results found.   Review of Systems:   A ROS was performed including pertinent positives and negatives as documented in the HPI.   Musculoskeletal Exam:    There were no vitals taken for this visit.  Right shoulder incisions are well-appearing without erythema or drainage.  She does have paresthesias down the middle finger and ring finger of the right hand.  She is able to fire wrist extensors as well as thumb extensor.  She is able to fire at the biceps.  Remainder of sensory exam is intact.  Active forward elevation is passively to approximately 100 degrees with a hard endpoint.  Pain with external rotation at the side of more than 40 degrees.  Internal rotation is to L1 again with pain.  This is significantly painful as well as with external rotation of the side    Imaging:     MRI right shoulder: Thickened inferior glenohumeral ligament consistent with adhesive capsulitis.  Her repair does appear to be well intact  I personally reviewed and interpreted the radiographs.   Assessment:   Status post right shoulder rotator cuff repair with significant pain concerning for possible capsulitis.  At this time she is not getting any improvement with physical therapy and does have a hard endpoint with limited range of motion.  Given the fact that she is not a candidate for shoulder injection due to the impact this has on her blood sugar we will plan to proceed with right shoulder arthroscopy with lysis of  adhesions and manipulation.  I did discuss the risk and limitations associated with this well associate recovery.  After discussion she would like to proceed Plan :    - Plan for right shoulder arthroscopy with lysis of adhesions and manipulation under anesthesia   After a lengthy discussion of treatment options, including risks, benefits, alternatives, complications of surgical and nonsurgical conservative options, the patient elected surgical repair.   The patient  is aware of the material risks  and complications including, but not limited to injury to adjacent structures, neurovascular injury, infection, numbness, bleeding, implant failure, thermal burns, stiffness, persistent pain, failure to heal, disease transmission from allograft, need for further surgery, dislocation, anesthetic risks, blood clots, risks of death,and others. The probabilities of surgical success and failure discussed with patient given their particular co-morbidities.The time and nature of expected rehabilitation and recovery was discussed.The patient's questions were all answered preoperatively.  No barriers to understanding were noted. I  explained the natural history of the disease process and Rx rationale.  I explained to the patient what I considered to be reasonable expectations given their personal situation.  The final treatment plan was arrived at through a shared patient decision making process model.       I personally saw and evaluated the patient, and participated in the management and treatment plan.  Wilhelmenia Harada, MD Attending Physician, Orthopedic Surgery  This document was dictated using Dragon voice recognition software. A reasonable attempt at proof reading has been made to minimize errors.

## 2023-11-06 ENCOUNTER — Encounter: Attending: Physical Medicine and Rehabilitation | Admitting: Physical Medicine and Rehabilitation

## 2023-11-06 ENCOUNTER — Encounter: Payer: Self-pay | Admitting: Physical Medicine and Rehabilitation

## 2023-11-06 VITALS — BP 149/87 | HR 92 | Ht 66.0 in | Wt 201.8 lb

## 2023-11-06 DIAGNOSIS — M25512 Pain in left shoulder: Secondary | ICD-10-CM | POA: Insufficient documentation

## 2023-11-06 DIAGNOSIS — G8929 Other chronic pain: Secondary | ICD-10-CM | POA: Diagnosis not present

## 2023-11-06 DIAGNOSIS — M7541 Impingement syndrome of right shoulder: Secondary | ICD-10-CM

## 2023-11-06 MED ORDER — CAPSAICIN-CLEANSING GEL 8 % EX KIT
2.0000 | PACK | Freq: Once | CUTANEOUS | Status: AC
Start: 2023-11-06 — End: 2023-11-06
  Administered 2023-11-06: 2 via TOPICAL

## 2023-11-06 NOTE — Progress Notes (Signed)
 Subjective:     Patient ID: [name], female   DOB: 1970/08/02, 53 y.o.   MRN: [id number]  HPI [name] is a 53 year old man who presents to establish care for traumatic rotator cuff tear.  1) Traumatic rotator cuff tear: -failed surgery, celebrex  (affects her stomach even with pantoprazole ), Neurontin , Lyrica  -has neuropathic pain and nothing touches it -when she is in therapy her shoulder hurts -average pain is 3/10 -discussed that she has returned to work -discussed her plan for arthroscopic surgery with Dr. Hermina Loosen  2) Carpal tunnel syndrome: -her 3rd and 4th digits are numb -drops papers -she is doing OT and PT -would like to try Qutenza today -has been referred to ortho  3) Cubital tunnel pain:  -diagnosed with EMG -would like to try Qutenza today -has been referred to ortho    Family History  Problem Relation Age of Onset   Diabetes Mother    Heart disease Mother 26   Hypertension Mother    Hyperlipidemia Mother    Heart failure Mother    Stroke Mother    Thyroid  disease Mother    Depression Mother    Sleep apnea Mother    Obesity Mother    Colon polyps Mother    Cancer Father 38       Lung Cancer   Heart disease Father    Mental illness Sister        bipolar, substance abuse,  clean 2 yrs   Hypertension Sister    Cancer Paternal Grandmother 22       breast cancer   Breast cancer Paternal Grandmother    Diabetes Maternal Grandmother    Hypertension Maternal Grandmother    Diabetes Son    Colon cancer Neg Hx    Esophageal cancer Neg Hx    Rectal cancer Neg Hx    Stomach cancer Neg Hx    Social History   Socioeconomic History   Marital status: Divorced    Spouse name: Not on file   Number of children: 2   Years of education: 14   Highest education level: Manufacturing engineer (e.g., MA, MS, MEng, MEd, MSW, MBA)  Occupational History   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  Has a son and a daughter.   Right-handed.   1-3 cups caffeine  weekly.   Social Drivers of Corporate investment banker Strain: Not on file  Food Insecurity: Not on file  Transportation Needs: Not on file  Physical Activity: Not on file  Stress: Not on file  Social Connections: Not on file   Past Surgical History:  Procedure Laterality Date   ABDOMINAL HYSTERECTOMY  2006   heavy menses, endometriosis, l oophrectomy   BREAST BIOPSY Right 2018   benign   BREAST EXCISIONAL BIOPSY Right 2003   benign   BREAST EXCISIONAL BIOPSY Right 1999   benign   COLONOSCOPY  10/06/2020   INGUINAL HERNIA REPAIR Left 2003   LEFT OOPHORECTOMY  2006   LUMBAR FUSION  2005   L5-S1  SHOULDER ARTHROSCOPY WITH ROTATOR CUFF REPAIR AND SUBACROMIAL DECOMPRESSION  04/06/2023   Procedure: SHOULDER ARTHROSCOPY WITH ROTATOR CUFF REPAIR , EXTENSIVE DEBRIDEMENT, AND SUBACROMIAL DECOMPRESSION;  Surgeon: Wilhelmenia Harada, MD;  Location: [facility];  Service: Orthopedics;;   SHOULDER ARTHROSCOPY WITH SUBACROMIAL DECOMPRESSION AND OPEN ROTATOR C Right 07/14/2016   Procedure: RIGHT SHOULDER ARTHROSCOPY WITH SUBACROMIAL DECOMPRESSION, DISTAL CLAVICLE RESECTION AND MINI OPEN ROTATOR CUFF REPAIR, OPEN BICEP TENDODESIS;  Surgeon: Shirlee Dotter, MD;  Location: [facility];  Service: Orthopedics;  Laterality: Right;   SHOULDER CLOSED REDUCTION Right 09/08/2016   Procedure: RIGHT CLOSED MANIPULATION SHOULDER;  Surgeon: Shirlee Dotter, MD;  Location: [facility];  Service: Orthopedics;  Laterality: Right;   TUBAL LIGATION     UPPER GASTROINTESTINAL ENDOSCOPY  10/06/2020   Past Surgical History:   Procedure Laterality Date   ABDOMINAL HYSTERECTOMY  2006   heavy menses, endometriosis, l oophrectomy   BREAST BIOPSY Right 2018   benign   BREAST EXCISIONAL BIOPSY Right 2003   benign   BREAST EXCISIONAL BIOPSY Right 1999   benign   COLONOSCOPY  10/06/2020   INGUINAL HERNIA REPAIR Left 2003   LEFT OOPHORECTOMY  2006   LUMBAR FUSION  2005   L5-S1   SHOULDER ARTHROSCOPY WITH ROTATOR CUFF REPAIR AND SUBACROMIAL DECOMPRESSION  04/06/2023   Procedure: SHOULDER ARTHROSCOPY WITH ROTATOR CUFF REPAIR , EXTENSIVE DEBRIDEMENT, AND SUBACROMIAL DECOMPRESSION;  Surgeon: Wilhelmenia Harada, MD;  Location: [facility];  Service: Orthopedics;;   SHOULDER ARTHROSCOPY WITH SUBACROMIAL DECOMPRESSION AND OPEN ROTATOR C Right 07/14/2016   Procedure: RIGHT SHOULDER ARTHROSCOPY WITH SUBACROMIAL DECOMPRESSION, DISTAL CLAVICLE RESECTION AND MINI OPEN ROTATOR CUFF REPAIR, OPEN BICEP TENDODESIS;  Surgeon: Shirlee Dotter, MD;  Location: [facility];  Service: Orthopedics;  Laterality: Right;   SHOULDER CLOSED REDUCTION Right 09/08/2016   Procedure: RIGHT CLOSED MANIPULATION SHOULDER;  Surgeon: Shirlee Dotter, MD;  Location:  SURGERY CENTER;  Service: Orthopedics;  Laterality: Right;   TUBAL LIGATION     UPPER GASTROINTESTINAL ENDOSCOPY  10/06/2020   Past Medical History:  Diagnosis Date   Allergy    Anemia    Anxiety    claustrophobic   Asthma    Back pain    Biceps tendonosis of right shoulder    Cataract    Mixed OU   COVID-19    covid PNA hospitalized 2020, 06/06/21   Diabetes mellitus 2011   did not start metforfin, losing weight   Difficult intubation    tooth got chipped one time   Dyspnea    Essential hypertension    Fatty liver    Food allergy    Frozen shoulder    left, had steroid injection 10/05/21   Headache disorder    History of concussion    HLD (hyperlipidemia)    Hypertensive retinopathy    OU   IBS (irritable bowel syndrome)    Infertility, female     Joint pain    Lactose intolerance    Leg edema    Migraines    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   Palpitation    Post-menopausal    Seborrheic dermatitis    Shoulder impingement syndrome, right    Type 2 diabetes mellitus with complication, with long-term current use of insulin  (HCC) 2018   Uveitis    [place] [facility] est in 2020 seen specialist in [place] [facility] last seen 11 or 05/2020   Vitamin D  deficiency    BP Aaron Aas)  149/87 (BP Location: Left Arm)   Pulse 92   Ht 5\' 6"  (1.676 m)   Wt 201 lb 12.8 oz (91.5 kg)   SpO2 96%   BMI 32.57 kg/m   Opioid Risk Score:   Fall Risk Score:  `1  Depression screen [facility] 2/9     11/06/2023    9:42 AM 08/21/2023    4:17 PM 08/01/2023    8:29 AM 07/01/2022    2:38 PM 05/27/2022    8:20 AM 09/21/2021    3:13 PM 06/23/2021    1:47 PM  Depression screen PHQ 2/9  Decreased Interest 0 1 0 0 0 0 1  Down, Depressed, Hopeless 0 0 1 1 0 0 0  PHQ - 2 Score 0 1 1 1  0 0 1  Altered sleeping  0 0 0     Tired, decreased energy  0 0 0     Change in appetite  0 0 0     Feeling bad or failure about yourself   0 0 0     Trouble concentrating  1 1 0     Moving slowly or fidgety/restless  0 0 0     Suicidal thoughts  0 0 0     PHQ-9 Score  2 2 1      Difficult doing work/chores  Somewhat difficult Not difficult at all Not difficult at all       Review of Systems +Right shoulder, elbow, and hand pain    Objective:   Physical Exam Gen: no distress, normal appearing HEENT: oral mucosa pink and moist, NCAT Cardio: Reg rate Chest: normal effort, normal rate of breathing Abd: soft, non-distended Ext: no edema Psych: pleasant, normal affect Skin: intact Neuro: Alert and oriented x3 Decreased sensation in right third and 4th fingers, limited range of motion of right shoulder    Assessment:      Plan:     1) Chronic Pain Syndrome secondary to right sided rotator cuff tear -Discussed current symptoms of pain and history of pain.   -Discussed benefits of exercise in reducing pain. -Discussed following foods that may reduce pain: 1) Ginger (especially studied for arthritis)- reduce leukotriene production to decrease inflammation 2) Blueberries- high in phytonutrients that decrease inflammation 3) Salmon- marine omega-3s reduce joint swelling and pain 4) Pumpkin seeds- reduce inflammation 5) dark chocolate- reduces inflammation 6) turmeric- reduces inflammation 7) tart cherries - reduce pain and stiffness 8) extra virgin olive oil - its compound olecanthal helps to block prostaglandins  9) chili peppers- can be eaten or applied topically via capsaicin 10) mint- helpful for headache, muscle aches, joint pain, and itching 11) garlic- reduces inflammation 12) Green tea- reduces inflammation and oxidative stress, helps with weight loss, may reduce the risk of cancer, recommend Double Green Matcha [place] of Tea daily  -Discussed Qutenza as an option for neuropathic pain control. Discussed that this is a capsaicin patch, stronger than capsaicin cream. Discussed that it is currently approved for diabetic peripheral neuropathy and post-herpetic neuralgia, but that it has also shown benefit in treating other forms of neuropathy. Provided patient with link to site to learn more about the patch: https://www.clark.biz/. Discussed that the patch would be placed in office and benefits usually last 3 months. Discussed that unintended exposure to capsaicin can cause severe irritation of eyes, mucous membranes, respiratory tract, and skin, but that Qutenza is a local treatment and does not have the systemic side effects of other nerve medications. Discussed that there may be  pain, itching, erythema, and decreased sensory function associated with the application of Qutenza. Side effects usually subside within 1 week. A cold pack of analgesic medications can help with these side effects. Blood pressure can also be increased due to pain  associated with administration of the patch.   1 patch of Qutenza [phone]) was applied to the right shoulder. Ice packs were applied during the procedure to ensure patient comfort. Blood pressure was monitored every 15 minutes. The patient tolerated the procedure well. Post-procedure instructions were given and follow-up has been scheduled.  Topical system measures 14cm x20cm (280cm for a total 1120units) were applied which will cause deeper penetration for destruction of the peripheral nerve using a chemical (Qutenza) which infuses into the skin like an injection and heat technique (occlusive, compressive dressing cauing endothermic heat technique).  Discussed extracorporeal shockwave therapy as a modality for treatment. Discussed that the device looks and feels like a massage gun and I would move it over the area of pain for about 10 minutes. The device releases sound waves to the area of pain and helps to improve blood flow and circulation to improve the healing process. Discuss that this initially induces inflammation and can sometimes cause short-term increase in pain. Discussed that we typically do three weekly treatments, but sometimes up to 6 if needed, and after 6 weeks long term benefits can sometimes be achieved. Discussed that this is an FDA approved device, but not covered by insurance and would cost $60 per session. Will scheduled patient for 6 consecutive appointments and can cancel latter three if benefits are achieved after first three sessions.       Link to further information on diet for chronic pain: http://www.bray.com/   2) Carpal tunnel syndrome: -continue orthotic  -discussed that we could try Qutenza here in the future but it is riskier as patient's accidentally touch their eyes with their hands  3) Cubital tunnel syndrome: -continue PT and OT  -Discussed Qutenza as an option for neuropathic pain control.  Discussed that this is a capsaicin patch, stronger than capsaicin cream. Discussed that it is currently approved for diabetic peripheral neuropathy and post-herpetic neuralgia, but that it has also shown benefit in treating other forms of neuropathy. Provided patient with link to site to learn more about the patch: https://www.clark.biz/. Discussed that the patch would be placed in office and benefits usually last 3 months. Discussed that unintended exposure to capsaicin can cause severe irritation of eyes, mucous membranes, respiratory tract, and skin, but that Qutenza is a local treatment and does not have the systemic side effects of other nerve medications. Discussed that there may be pain, itching, erythema, and decreased sensory function associated with the application of Qutenza. Side effects usually subside within 1 week. A cold pack of analgesic medications can help with these side effects. Blood pressure can also be increased due to pain associated with administration of the patch.   2 patches of Qutenza [phone]) was applied to the right elbow and forearm. Ice packs were applied during the procedure to ensure patient comfort. Blood pressure was monitored every 15 minutes. The patient tolerated the procedure well. Post-procedure instructions were given and follow-up has been scheduled.  Topical system measures 14cm x20cm (280cm for a total 1120units) were applied which will cause deeper penetration for destruction of the peripheral nerve using a chemical (Qutenza) which infuses into the skin like an injection and heat technique (occlusive, compressive dressing cauing endothermic heat technique)      4) Insomnia: -  Try to go outside near sunrise -Get exercise during the day.  -Turn off all devices an hour before bedtime.  -Teas that can benefit: chamomile, valerian root, Brahmi (Bacopa) -Can consider over the counter melatonin, magnesium , and/or L-theanine. Melatonin is an anti-oxidant with  multiple health benefits. Magnesium  is involved in greater than 300 enzymatic reactions in the body and most of us  are deficient as our soil is often depleted. There are 7 different types of magnesium - Bioptemizer's is a supplement with all 7 types, and each has unique benefits. Magnesium  can also help with constipation and anxiety.  -Pistachios naturally increase the production of melatonin -Cozy Earth bamboo bed sheets are free from toxic chemicals.  -Tart cherry juice or a tart cherry supplement can improve sleep and soreness post-workout

## 2023-11-15 ENCOUNTER — Other Ambulatory Visit (HOSPITAL_BASED_OUTPATIENT_CLINIC_OR_DEPARTMENT_OTHER): Payer: Self-pay | Admitting: Orthopaedic Surgery

## 2023-11-19 ENCOUNTER — Ambulatory Visit
Admission: RE | Admit: 2023-11-19 | Discharge: 2023-11-19 | Discharge status: Home / Self Care | Source: Ambulatory Visit | Attending: Physician Assistant

## 2023-11-19 ENCOUNTER — Other Ambulatory Visit: Payer: Self-pay

## 2023-11-19 ENCOUNTER — Ambulatory Visit (INDEPENDENT_AMBULATORY_CARE_PROVIDER_SITE_OTHER): Admitting: Radiology

## 2023-11-19 VITALS — BP 159/85 | HR 74 | Temp 97.9°F | Resp 18 | Ht 66.0 in | Wt 200.2 lb

## 2023-11-19 DIAGNOSIS — G44319 Acute post-traumatic headache, not intractable: Secondary | ICD-10-CM | POA: Diagnosis not present

## 2023-11-19 DIAGNOSIS — M542 Cervicalgia: Secondary | ICD-10-CM | POA: Diagnosis not present

## 2023-11-19 DIAGNOSIS — M25511 Pain in right shoulder: Secondary | ICD-10-CM

## 2023-11-19 DIAGNOSIS — S134XXA Sprain of ligaments of cervical spine, initial encounter: Secondary | ICD-10-CM

## 2023-11-19 MED ORDER — ONDANSETRON 4 MG PO TBDP
4.0000 mg | ORAL_TABLET | Freq: Once | ORAL | Status: AC
  Administered 2023-11-19: 4 mg via ORAL

## 2023-11-19 MED ORDER — ONDANSETRON 4 MG PO TBDP
4.0000 mg | ORAL_TABLET | Freq: Three times a day (TID) | ORAL | 0 refills | Status: DC | PRN
  Filled 2023-11-19: qty 20, 7d supply, fill #0
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
## 2023-11-20 ENCOUNTER — Other Ambulatory Visit: Payer: Self-pay

## 2023-11-20 ENCOUNTER — Other Ambulatory Visit (HOSPITAL_COMMUNITY): Payer: Self-pay

## 2023-11-20 ENCOUNTER — Telehealth: Payer: Self-pay | Admitting: Neurology
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
## 2023-11-21 ENCOUNTER — Encounter (HOSPITAL_BASED_OUTPATIENT_CLINIC_OR_DEPARTMENT_OTHER): Payer: Self-pay | Admitting: Orthopaedic Surgery
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
## 2023-11-21 NOTE — Telephone Encounter (Signed)
 Called and scheduled pt to see Cleora Daft tomorrow at 2

## 2023-11-22 ENCOUNTER — Ambulatory Visit (HOSPITAL_BASED_OUTPATIENT_CLINIC_OR_DEPARTMENT_OTHER): Admitting: Student

## 2023-11-22 ENCOUNTER — Encounter (HOSPITAL_BASED_OUTPATIENT_CLINIC_OR_DEPARTMENT_OTHER): Payer: Self-pay | Admitting: Student

## 2023-11-22 DIAGNOSIS — M7501 Adhesive capsulitis of right shoulder: Secondary | ICD-10-CM

## 2023-11-22 DIAGNOSIS — M542 Cervicalgia: Secondary | ICD-10-CM
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
## 2023-11-28 ENCOUNTER — Ambulatory Visit: Payer: Commercial Managed Care - PPO | Admitting: Internal Medicine

## 2023-11-28 ENCOUNTER — Encounter: Payer: Self-pay | Admitting: Internal Medicine

## 2023-11-28 VITALS — BP 122/70 | HR 74 | Ht 66.0 in | Wt 200.6 lb

## 2023-11-28 DIAGNOSIS — E66811 Obesity, class 1: Secondary | ICD-10-CM | POA: Diagnosis not present

## 2023-11-28 DIAGNOSIS — E1140 Type 2 diabetes mellitus with diabetic neuropathy, unspecified: Secondary | ICD-10-CM | POA: Diagnosis not present

## 2023-11-28 DIAGNOSIS — E1165 Type 2 diabetes mellitus with hyperglycemia: Secondary | ICD-10-CM | POA: Diagnosis not present

## 2023-11-28 DIAGNOSIS — Z794 Long term (current) use of insulin: Secondary | ICD-10-CM | POA: Diagnosis not present

## 2023-11-28 DIAGNOSIS — E7849 Other hyperlipidemia: Secondary | ICD-10-CM | POA: Diagnosis not present

## 2023-11-28 LAB — POCT GLYCOSYLATED HEMOGLOBIN (HGB A1C): Hemoglobin A1C: 6.4 % — AB (ref 4.0–5.6)

## 2023-11-28 MED ORDER — METFORMIN HCL ER 500 MG PO TB24
500.0000 mg | ORAL_TABLET | Freq: Every day | ORAL | 3 refills | Status: AC
Start: 2023-11-28 — End: ?
  Filled 2023-11-28: qty 90, 90d supply, fill #0

## 2023-11-28 MED ORDER — TIRZEPATIDE 2.5 MG/0.5ML ~~LOC~~ SOAJ
2.5000 mg | SUBCUTANEOUS | 5 refills | Status: DC
  Filled 2023-11-28: qty 2, 28d supply, fill #0

## 2023-11-28 NOTE — Patient Instructions (Addendum)
 Please stop Ozempic  and start: - Mounjaro 2.5 mg weekly. Let me know if we can increase the dose.  Continue: - Metformin  ER 500 mg daily  Please return in 4 months.

## 2023-11-28 NOTE — Progress Notes (Signed)
 Patient ID: [name], female   DOB: 1971-03-25, 53 y.o.   MRN: [id number]  HPI: [name] is a 30 y.o.-year-old female, returning for follow-up for DM2, dx in 2013, with history of GDM in 1990 and 2004, previously insulin -dependent since 2018, but currently off insulin , now better controlled, with complications (mild CKD, PN).  She was previously seen by Dr. Kassie. Last visit with me 4 months ago (virtual)  Interim history: No increased urination, blurry vision. She has nausea. Today: nausea and HA - took Zofran . She had shoulder sx 04/06/2023. She has pbs with nerve compression, carpal and cubital tunnel sd.  At last visit she was in PT/OT and started Lyrica . She will have another shoulder sx 12/14/2023.  Reviewed HbA1c: Lab Results  Component Value Date   HGBA1C 6.5 (A) 07/28/2023   HGBA1C 5.9 (A) 01/17/2023   HGBA1C 6.3 (A) 06/30/2022   HGBA1C 6.5 (A) 01/28/2022   HGBA1C 8.1 (A) 10/19/2021   HGBA1C 7.5 (A) 08/03/2021   HGBA1C 7.6 (A) 05/03/2021   HGBA1C 7.0 (A) 01/26/2021   HGBA1C 6.9 (A) 10/26/2020   HGBA1C 6.1 (A) 07/28/2020   Previous on: - Ozempic  2 mg weekly - Humalog  22 -24 (30) units 2x a day after meals (patient has mostly 2 meals a day) She tried Victoza  >> dysphagia. She tried Jardiance  >> yeast infections. She was taking Metformin  11/2020 after he had IBS symptoms.  Now on: - Ozempic  2 >> 1 mg weekly (decreased 08/2022 2/2 hypoglycemia) - Metformin  ER 500 mg with dinner  She was previously on Tresiba , started 10/2021: 14, then 20 units daily, but was able to come off. She had N/V/D with higher doses of metformin  ER.  Pt checks her sugars more than 4 times a day with her Dexcom G7 CGM:  Previously:  Previously:   Lowest sugar was 63 (sensor) >> 45 >> 68 >> <70; she has hypoglycemia awareness at 70.  Highest sugar was 300s >> ... 200 >> 190s >> 265 >> 200s  -+ Stage IIIa CKD, last BUN/creatinine:  Lab Results  Component Value Date   BUN 10  10/09/2023   BUN 12 03/29/2023   CREATININE 0.77 10/09/2023   CREATININE 0.70 03/29/2023   Lab Results  Component Value Date   MICRALBCREAT 39.2 (H) 10/09/2023   MICRALBCREAT 29.3 08/22/2023   MICRALBCREAT 1.3 10/23/2018   MICRALBCREAT 3.2 11/20/2017   MICRALBCREAT 14.4 07/06/2017   MICRALBCREAT 1.5 05/18/2016   MICRALBCREAT 0.8 07/24/2015   MICRALBCREAT 1.0 01/19/2015   MICRALBCREAT 1.2 06/10/2014   MICRALBCREAT 1.8 05/06/2013  She on Losartan .  -+ HL; last set of lipids: Lab Results  Component Value Date   CHOL 125 10/09/2023   HDL 43 (L) 10/09/2023   LDLCALC 68 10/09/2023   LDLDIRECT 88.0 09/21/2021   TRIG 55 10/09/2023   CHOLHDL 2.9 10/09/2023  On Lipitor 20 mg daily.  - last eye exam was 05/10/2023. No DR. [facility].  - + numbness and tingling in her feet. Last foot exam: 08/21/2023.  On ASA 81.  Patient also has a history of HTN, fatty liver, IBS.  ROS: + see HPI  Past Medical History:  Diagnosis Date   Allergy    Anemia    Anxiety    claustrophobic   Asthma    Back pain    Biceps tendonosis of right shoulder    Cataract    Mixed OU   COVID-19    covid PNA hospitalized 2020, 06/06/21   Diabetes mellitus 2011  did not start metforfin, losing weight   Difficult intubation    tooth got chipped one time   Dyspnea    Essential hypertension    Fatty liver    Food allergy    Frozen shoulder    left, had steroid injection 10/05/21   Headache disorder    History of concussion    HLD (hyperlipidemia)    Hypertensive retinopathy    OU   IBS (irritable bowel syndrome)    Infertility, female    Joint pain    Lactose intolerance    Leg edema    Migraines    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   Palpitation    Post-menopausal    Seborrheic dermatitis    Shoulder impingement syndrome, right    Type 2 diabetes mellitus with complication, with long-term current use of insulin  (HCC) 2018   Uveitis    [facility] est in 2020 seen  specialist in [place] [facility] last seen 11 or 05/2020   Vitamin D  deficiency    Past Surgical History:  Procedure Laterality Date   ABDOMINAL HYSTERECTOMY  2006   heavy menses, endometriosis, l oophrectomy   BREAST BIOPSY Right 2018   benign   BREAST EXCISIONAL BIOPSY Right 2003   benign   BREAST EXCISIONAL BIOPSY Right 1999   benign   COLONOSCOPY  10/06/2020   INGUINAL HERNIA REPAIR Left 2003   LEFT OOPHORECTOMY  2006   LUMBAR FUSION  2005   L5-S1   SHOULDER ARTHROSCOPY WITH ROTATOR CUFF REPAIR AND SUBACROMIAL DECOMPRESSION  04/06/2023   Procedure: SHOULDER ARTHROSCOPY WITH ROTATOR CUFF REPAIR , EXTENSIVE DEBRIDEMENT, AND SUBACROMIAL DECOMPRESSION;  Surgeon: Genelle Standing, MD;  Location: [facility];  Service: Orthopedics;;   SHOULDER ARTHROSCOPY WITH SUBACROMIAL DECOMPRESSION AND OPEN ROTATOR C Right 07/14/2016   Procedure: RIGHT SHOULDER ARTHROSCOPY WITH SUBACROMIAL DECOMPRESSION, DISTAL CLAVICLE RESECTION AND MINI OPEN ROTATOR CUFF REPAIR, OPEN BICEP TENDODESIS;  Surgeon: Maude LELON Right, MD;  Location: [facility];  Service: Orthopedics;  Laterality: Right;   SHOULDER CLOSED REDUCTION Right 09/08/2016   Procedure: RIGHT CLOSED MANIPULATION SHOULDER;  Surgeon: Maude LELON Right, MD;  Location: [facility];  Service: Orthopedics;  Laterality: Right;   TUBAL LIGATION     UPPER GASTROINTESTINAL ENDOSCOPY  10/06/2020   Social History   Socioeconomic History   Marital status: Divorced    Spouse name: Not on file   Number of children: 2   Years of education: 14   Highest education level: Master's degree (e.g., MA, MS, MEng, MEd, MSW, MBA)  Occupational History   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  Has a son and a daughter.   Right-handed.   1-3 cups caffeine  weekly.   Social Drivers of Corporate investment banker Strain: Not on file  Food Insecurity: Not on file  Transportation Needs: Not on file  Physical Activity: Not on file  Stress: Not on file  Social Connections: Not on file  Intimate Partner Violence: Not on file   Current  Outpatient Medications on File Prior to Visit  Medication Sig Dispense Refill   albuterol  (VENTOLIN  HFA) 108 (90 Base) MCG/ACT inhaler Inhale 2 puffs into the lungs every 6 (six) hours as needed for wheezing or shortness of breath. 18 g 0   aspirin  EC 325 MG tablet Take 1 tablet (325 mg total) by mouth daily. 14 tablet 0   aspirin  EC 81 MG tablet Take 1 tablet (81 mg total) by mouth daily. Swallow whole. 90 tablet 3   Atogepant  (QULIPTA ) 60 MG TABS Take 1 tablet (60 mg total) by mouth daily. (Patient taking differently: Take 1 tablet by mouth at bedtime.) 30 tablet 11   atorvastatin  (LIPITOR) 20 MG tablet Take 1 tablet (20 mg total) by mouth daily. (Patient taking differently: Take 20 mg by mouth at bedtime.) 90 tablet 3   butalbital -acetaminophen -caffeine  (FIORICET ) 50-325-40 MG tablet Take 1 tablet by mouth every 6 (six) hours as needed for headache. Do not refill in less than 30 day 30 tablet 5   celecoxib  (CELEBREX ) 200 MG capsule Take 1 capsule (200 mg total) by mouth 2 (two) times daily as needed for arm pain 60 capsule 2   Continuous Glucose Sensor (DEXCOM G7 SENSOR) MISC Apply 1 sensor every 10 days. 9 each 4   diazepam  (VALIUM ) 5 MG tablet Take 1 tablet (5 mg total) by mouth every 12 (twelve) hours as needed for anxiety. 30 tablet 5   dicyclomine  (BENTYL ) 20 MG tablet Take 1 tablet (20 mg total) by mouth every 6 (six) hours. (Patient taking differently: Take 20 mg by mouth 3 (three) times daily as needed (as needed).) 90 tablet 1    EPINEPHrine  0.3 mg/0.3 mL IJ SOAJ injection Inject 0.3 mg into the muscle as needed for anaphylaxis. 2 each 0   fluconazole  (DIFLUCAN ) 150 MG tablet Take 1 tablet (150 mg total) by mouth daily. 5 tablet 0   gabapentin  (NEURONTIN ) 100 MG capsule Take 1 capsule (100 mg total) by mouth 3 (three) times daily. (Patient taking differently: Take 100 mg by mouth 3 (three) times daily. As needed) 30 capsule 1   Ipratropium-Albuterol  (COMBIVENT ) 20-100 MCG/ACT AERS respimat Inhale 1 puff into the lungs every 6 (six) hours. (Patient taking differently: Inhale 1 puff into the lungs every 6 (six) hours as needed for shortness of breath.) 4 g 11   losartan  (COZAAR ) 100 MG tablet Take 1 tablet (100 mg total) by mouth daily. 90 tablet 1   metFORMIN  (GLUCOPHAGE -XR) 500 MG 24 hr tablet Take 2 tablets (1,000 mg total) by mouth 2 times daily with a meal (at breakfast and at dinner) 360 tablet 0   metoprolol  succinate (TOPROL -XL) 25 MG 24 hr tablet Take 0.5 tablets (12.5 mg total) by mouth daily. 45 tablet 0   ondansetron  (ZOFRAN -ODT) 4 MG disintegrating tablet Take 1 tablet (4 mg total) by mouth every 8 (eight) hours as needed for nausea or vomiting. 20 tablet 0   ondansetron  (ZOFRAN -ODT) 4 MG disintegrating tablet Dissolve 1 tablet (4 mg total) by mouth every 8 (eight) hours as needed for nausea or vomiting. 20 tablet 0   oxyCODONE  (ROXICODONE ) 5 MG immediate release tablet Take 1 tablet (5 mg total) by mouth every 4 (four) hours as needed for severe pain (pain score 7-10) or breakthrough pain. 15 tablet 0   oxyCODONE  (ROXICODONE ) 5 MG immediate release tablet Take 1 tablet (5 mg total) by mouth every 4 (four) hours as needed for severe pain (pain score 7-10) or breakthrough pain. 20 tablet 0  pantoprazole  (PROTONIX ) 40 MG tablet Take 1 tablet (40 mg total) by mouth daily. 30 tablet 3   pregabalin  (LYRICA ) 25 MG capsule Take 1 capsule (25 mg total) by mouth at bedtime. 90 capsule 3   Semaglutide , 1 MG/DOSE, (OZEMPIC , 1  MG/DOSE,) 4 MG/3ML SOPN Inject 1 mg into the skin once a week as directed. 9 mL 1   Ubrogepant  (UBRELVY ) 100 MG TABS Take 1 tablet (100 mg total) by mouth as needed. May repeat dose in 2 hours if needed. Max dose 2 tablets in 2 hours 16 tablet 11   Vitamin D , Ergocalciferol , (DRISDOL ) 1.25 MG (50000 UNIT) CAPS capsule Take 1 capsule by mouth once a week. (Patient taking differently: Take 50,000 Units by mouth once a week. Sunday) 4 capsule 11   No current facility-administered medications on file prior to visit.   Allergies  Allergen Reactions   Bamlanivimab  Anaphylaxis, Itching, Palpitations, Rash, Shortness Of Breath and Swelling   Botox [Onabotulinumtoxina ] Shortness Of Breath    syncope   Clindamycin/Lincomycin Other (See Comments)   Contrast Media [Iodinated Contrast Media] Shortness Of Breath and Palpitations   Kiwi Extract Shortness Of Breath   Maxalt [Rizatriptan Benzoate] Anaphylaxis    Chest pain   Nitrous Oxide Shortness Of Breath    syncope   Rizatriptan Benzoate Anaphylaxis    Chest pain   Triptans Shortness Of Breath   Vyepti  [Eptinezumab -Jjmr] Shortness Of Breath, Palpitations and Other (See Comments)    Throat soreness, tachycardia   Aspirin  Nausea And Vomiting    Mouth blisters   Haemophilus Influenzae Vaccines    Latex     Sometimes causes rash, welts   Mango Flavoring Agent (Non-Screening) Swelling    Lips swell   Vicodin [Hydrocodone-Acetaminophen ]     hallucinations   Erythromycin Rash   Flagyl  [Metronidazole ] Rash   Influenza Virus Vaccine Palpitations   Penicillins Nausea And Vomiting, Rash and Other (See Comments)    Did it involve swelling of the face/tongue/throat, SOB, or low BP? No Did it involve sudden or severe rash/hives, skin peeling, or any reaction on the inside of your mouth or nose? No Did you need to seek medical attention at a hospital or doctor's office? Yes When did it last happen?     patient was 53 years old  If all above answers are  "NO", may proceed with cephalosporin use.   Tetracyclines & Related Nausea And Vomiting and Rash   Family History  Problem Relation Age of Onset   Diabetes Mother    Heart disease Mother 46   Hypertension Mother    Hyperlipidemia Mother    Heart failure Mother    Stroke Mother    Thyroid  disease Mother    Depression Mother    Sleep apnea Mother    Obesity Mother    Colon polyps Mother    Cancer Father 85       Lung Cancer   Heart disease Father    Mental illness Sister        bipolar, substance abuse,  clean 2 yrs   Hypertension Sister    Cancer Paternal Grandmother 99       breast cancer   Breast cancer Paternal Grandmother    Diabetes Maternal Grandmother    Hypertension Maternal Grandmother    Diabetes Son    Colon cancer Neg Hx    Esophageal cancer Neg Hx    Rectal cancer Neg Hx    Stomach cancer Neg Hx    PE: BP 122/70  Pulse 74   Ht 5' 6 (1.676 m)   Wt 200 lb 9.6 oz (91 kg)   SpO2 98%   BMI 32.38 kg/m   Wt Readings from Last 15 Encounters:  11/28/23 200 lb 9.6 oz (91 kg)  11/19/23 200 lb 3.2 oz (90.8 kg)  11/06/23 201 lb 12.8 oz (91.5 kg)  08/29/23 200 lb (90.7 kg)  08/21/23 198 lb 12.8 oz (90.2 kg)  08/01/23 201 lb 12.8 oz (91.5 kg)  04/12/23 200 lb 2 oz (90.8 kg)  04/06/23 183 lb (83 kg)  03/29/23 183 lb (83 kg)  01/17/23 190 lb 12.8 oz (86.5 kg)  01/02/23 180 lb (81.6 kg)  08/24/22 180 lb 8 oz (81.9 kg)  07/26/22 178 lb (80.7 kg)  07/01/22 183 lb (83 kg)  06/30/22 185 lb (83.9 kg)   Constitutional: overweight, in NAD Eyes:  EOMI, no exophthalmos ENT: no neck masses, no cervical lymphadenopathy Cardiovascular: RRR, No MRG Respiratory: CTA B Musculoskeletal: no deformities Skin:no rashes Neurological: no tremor with outstretched hands  ASSESSMENT: 1. DM2, currently non insulin -dependent, better controlled, with complications - CKD - mild - PN  2. HL  3.  Obesity class I  PLAN:  1. Patient with longstanding, previously uncontrolled  type 2 diabetes, on a weekly GLP-1 receptor agonist and low-dose metformin , with improved control.  At last check, HbA1c was slightly higher, at 6.5%, but still at goal.  Previous HbA1c was decreased to 5.9%. - At last visit, reviewing her CGM tracings, sugars appeared to be still mostly fluctuating within the target range with only occasional hyperglycemic spikes after breakfast and dinner.  We discussed about adjusting diet, but I did not feel she needed a change in regimen.  She was doing a great job exercising 5 times a week and she noticed that sugars were dropping approximately 100 points with exercise.  She was not usually dropping too low. CGM interpretation: -At today's visit, we reviewed her CGM downloads: It appears that 87% of values are in target range (goal >70%), while 13% are higher than 180 (goal <25%), and 0% are lower than 70 (goal <4%).  The calculated average blood sugar is 150.  The projected HbA1c for the next 3 months (GMI) is 6.9%. -Reviewing the CGM trends, sugars are fluctuating within the target range, with only occasional hyperglycemic spikes.  Overall, sugars are fluctuating within the normal range, with a coefficient of variation of only 18.5%, which is desirable. However, unfortunately, she mentions that she is not tolerating Ozempic  very well, she has nausea for approximately 3 days after taking it.  This is not very severe, but is bothersome.  Today she has a headache and was also nauseated and she had to take Zofran .  We did discuss about possibly decreasing the dose but I would like to try Mounjaro for her.  She agrees with this.  If not covered by her insurance, we can go back to Ozempic  at a lower dose. Will start Mounjaro at the lowest dose and increase as tolerated. Will continue the low-dose metformin  for now. - I suggested to:  Patient Instructions  Please stop Ozempic  and start: - Mounjaro 2.5 mg weekly. Let me know if we can increase the dose.  Continue: -  Metformin  ER 500 mg daily  Please return in 4 months.  - we checked her HbA1c: 6.4% (lower) - advised to check sugars at different times of the day - 4x a day, rotating check times - advised for yearly eye exams >>  she is UTD - At the last check by PCP she had a slightly elevated ACR.  She was advised to continue losartan  and keep the blood pressure under 120/70.  She has a follow-up with PCP coming up. - return to clinic in 4 months  2. HL - Latest lipid panel was checked in 10/2023: Fractions at goal except for slightly low HDL: Lab Results  Component Value Date   CHOL 125 10/09/2023   HDL 43 (L) 10/09/2023   LDLCALC 68 10/09/2023   LDLDIRECT 88.0 09/21/2021   TRIG 55 10/09/2023   CHOLHDL 2.9 10/09/2023  - He continues on Lipitor 20 mg daily without side effects  3.  Obesity class I -She was able to lose approximately 60 pounds after starting Ozempic  - Afterwards, she gained 10 pounds, but she was happier with this weight and was trying to maintain it;  weight was stable at last OV - usually 187-202 lbs - gained 10 lbs since our in person visit in 01/2023 - Will try to switch from Ozempic  to Mounjaro.  Lela Fendt, MD PhD [facility] Endocrinology

## 2023-11-29 ENCOUNTER — Other Ambulatory Visit: Payer: Self-pay

## 2023-11-29 ENCOUNTER — Other Ambulatory Visit (HOSPITAL_COMMUNITY): Payer: Self-pay

## 2023-11-30 NOTE — Telephone Encounter (Signed)
 I will be more than happy to schedule her at [facility]. I offered her that option when I originally spoke with her, but she was adamant she had to have surgery on a Thursday. Thursdays you are scheduled at [facility]. I also told her [facility] accepted her insurance that was not a problem, not that her insurance would cover the surgery cost. It sounds as if she has a deductible that has to be met. That will apply at [facility] as well. The only difference is [facility] will not require her to pay 1/3 of the cost the day of surgery. I will be more than happy to contact the patient and discuss other scheduling options.

## 2023-12-01 ENCOUNTER — Telehealth: Payer: Self-pay | Admitting: Orthopaedic Surgery

## 2023-12-01 NOTE — Telephone Encounter (Signed)
 Pt called requesting a call back concerning her out of pocket for surgery. She state will her out of pocket be the same if she has surgery at hospital. Please call pt about this matter at [phone]. If is asking for someone to get back to her right away about this.

## 2023-12-03 ENCOUNTER — Other Ambulatory Visit: Payer: Self-pay | Admitting: Internal Medicine

## 2023-12-03 DIAGNOSIS — I10 Essential (primary) hypertension: Secondary | ICD-10-CM

## 2023-12-03 DIAGNOSIS — E559 Vitamin D deficiency, unspecified: Secondary | ICD-10-CM

## 2023-12-04 ENCOUNTER — Other Ambulatory Visit (HOSPITAL_COMMUNITY): Payer: Self-pay

## 2023-12-04 ENCOUNTER — Telehealth: Payer: Self-pay | Admitting: Orthopaedic Surgery

## 2023-12-04 ENCOUNTER — Other Ambulatory Visit: Payer: Self-pay

## 2023-12-04 MED ORDER — METOPROLOL SUCCINATE ER 25 MG PO TB24
12.5000 mg | ORAL_TABLET | Freq: Every day | ORAL | 0 refills | Status: DC
  Filled 2023-12-04: qty 45, 90d supply, fill #0

## 2023-12-04 NOTE — Telephone Encounter (Signed)
Updated faxed.

## 2023-12-04 NOTE — Telephone Encounter (Signed)
 Patient's surgery has been rescheduled from 7/10 to 7/22. Please correct FMLA paperwork to reflect the new surgery date, and resend for patient.

## 2023-12-04 NOTE — Telephone Encounter (Signed)
 I spoke with the patient and rescheduled her for 7/22 at [facility].

## 2023-12-06 ENCOUNTER — Other Ambulatory Visit (HOSPITAL_COMMUNITY): Payer: Self-pay

## 2023-12-06 ENCOUNTER — Other Ambulatory Visit: Payer: Self-pay

## 2023-12-06 MED ORDER — VITAMIN D (ERGOCALCIFEROL) 1.25 MG (50000 UNIT) PO CAPS
50000.0000 [IU] | ORAL_CAPSULE | ORAL | 11 refills | Status: AC
  Filled 2023-12-06: qty 4, 28d supply, fill #0
  Filled 2023-12-30: qty 4, 28d supply, fill #1
  Filled 2024-01-24: qty 4, 28d supply, fill #2
  Filled 2024-02-23: qty 4, 28d supply, fill #3
  Filled 2024-03-25: qty 4, 28d supply, fill #4
  Filled 2024-05-01: qty 4, 28d supply, fill #5
  Filled 2024-05-20: qty 4, 28d supply, fill #6
  Filled 2024-05-30: qty 12, 84d supply, fill #6

## 2023-12-19 ENCOUNTER — Encounter (HOSPITAL_BASED_OUTPATIENT_CLINIC_OR_DEPARTMENT_OTHER): Payer: Self-pay | Admitting: Orthopaedic Surgery

## 2023-12-21 ENCOUNTER — Encounter (HOSPITAL_BASED_OUTPATIENT_CLINIC_OR_DEPARTMENT_OTHER)
Admission: RE | Admit: 2023-12-21 | Discharge: 2023-12-21 | Disposition: A | Discharge status: Home / Self Care | Source: Ambulatory Visit | Attending: Orthopaedic Surgery | Admitting: Orthopaedic Surgery

## 2023-12-21 DIAGNOSIS — Z01818 Encounter for other preprocedural examination: Secondary | ICD-10-CM | POA: Diagnosis not present

## 2023-12-21 LAB — BASIC METABOLIC PANEL WITH GFR
Anion gap: 9 (ref 5–15)
BUN: 11 mg/dL (ref 6–20)
CO2: 25 mmol/L (ref 22–32)
Calcium: 9.4 mg/dL (ref 8.9–10.3)
Chloride: 107 mmol/L (ref 98–111)
Creatinine, Ser: 0.86 mg/dL (ref 0.44–1.00)
GFR, Estimated: >60 mL/min (ref >60)
Glucose, Bld: 112 mg/dL — ABNORMAL HIGH (ref 70–99)
Potassium: 4.2 mmol/L (ref 3.5–5.1)
Sodium: 141 mmol/L (ref 135–145)

## 2023-12-21 NOTE — Anesthesia Preprocedure Evaluation (Addendum)
 Anesthesia Evaluation  Patient identified by MRN, date of birth, ID band Patient awake    Reviewed: Allergy & Precautions, NPO status , Patient's Chart, lab work & pertinent test results  History of Anesthesia Complications (+) PONV, DIFFICULT AIRWAY and history of anesthetic complications  Airway Mallampati: IV  TM Distance: >3 FB Neck ROM: Limited    Dental  (+) Dental Advisory Given   Pulmonary asthma , neg sleep apnea, neg recent URI   breath sounds clear to auscultation       Cardiovascular hypertension,  Rhythm:Regular     Neuro/Psych  Headaches PSYCHIATRIC DISORDERS Anxiety        GI/Hepatic   Endo/Other  diabetes    Renal/GU      Musculoskeletal  (+) Arthritis ,    Abdominal   Peds  Hematology   Anesthesia Other Findings Labeled difficult airway. Pt seen in preop on 12/21/23. Limited neck mobility. 2018: Grade III view with MAC4. Pt had a a chipped tooth during that surgery that has since been repaired. 2024: Grade I view with video laryngoscopy. No complications.   Reproductive/Obstetrics                              Anesthesia Physical Anesthesia Plan  ASA: 2  Anesthesia Plan: General and Regional   Post-op Pain Management:    Induction: Intravenous  PONV Risk Score and Plan: 4 or greater and Ondansetron , Dexamethasone , Propofol  infusion, TIVA and Midazolam   Airway Management Planned: Oral ETT and Video Laryngoscope Planned  Additional Equipment: None  Intra-op Plan:   Post-operative Plan: Extubation in OR  Informed Consent: I have reviewed the patients History and Physical, chart, labs and discussed the procedure including the risks, benefits and alternatives for the proposed anesthesia with the patient or authorized representative who has indicated his/her understanding and acceptance.     Dental advisory given  Plan Discussed with: CRNA  Anesthesia Plan  Comments:         Anesthesia Quick Evaluation

## 2023-12-21 NOTE — Progress Notes (Signed)

## 2023-12-26 ENCOUNTER — Other Ambulatory Visit: Payer: Self-pay

## 2023-12-26 ENCOUNTER — Encounter (HOSPITAL_BASED_OUTPATIENT_CLINIC_OR_DEPARTMENT_OTHER): Payer: Self-pay | Admitting: Anesthesiology

## 2023-12-26 ENCOUNTER — Other Ambulatory Visit (HOSPITAL_COMMUNITY): Payer: Self-pay

## 2023-12-26 ENCOUNTER — Ambulatory Visit (HOSPITAL_BASED_OUTPATIENT_CLINIC_OR_DEPARTMENT_OTHER)
Admission: RE | Admit: 2023-12-26 | Discharge: 2023-12-26 | Disposition: A | Discharge status: Home / Self Care | Attending: Orthopaedic Surgery | Admitting: Orthopaedic Surgery

## 2023-12-26 ENCOUNTER — Ambulatory Visit (HOSPITAL_BASED_OUTPATIENT_CLINIC_OR_DEPARTMENT_OTHER): Payer: Self-pay | Admitting: Anesthesiology

## 2023-12-26 ENCOUNTER — Encounter (HOSPITAL_BASED_OUTPATIENT_CLINIC_OR_DEPARTMENT_OTHER)
Admission: RE | Disposition: A | Discharge status: Home / Self Care | Payer: Self-pay | Source: Home / Self Care | Attending: Orthopaedic Surgery

## 2023-12-26 ENCOUNTER — Encounter (HOSPITAL_BASED_OUTPATIENT_CLINIC_OR_DEPARTMENT_OTHER): Payer: Self-pay | Admitting: Orthopaedic Surgery

## 2023-12-26 DIAGNOSIS — M7501 Adhesive capsulitis of right shoulder: Secondary | ICD-10-CM | POA: Diagnosis not present

## 2023-12-26 DIAGNOSIS — Z7984 Long term (current) use of oral hypoglycemic drugs: Secondary | ICD-10-CM | POA: Diagnosis not present

## 2023-12-26 DIAGNOSIS — Z7985 Long-term (current) use of injectable non-insulin antidiabetic drugs: Secondary | ICD-10-CM | POA: Insufficient documentation

## 2023-12-26 DIAGNOSIS — I10 Essential (primary) hypertension: Secondary | ICD-10-CM | POA: Diagnosis not present

## 2023-12-26 DIAGNOSIS — G8918 Other acute postprocedural pain: Secondary | ICD-10-CM | POA: Diagnosis not present

## 2023-12-26 DIAGNOSIS — M7521 Bicipital tendinitis, right shoulder: Secondary | ICD-10-CM | POA: Diagnosis not present

## 2023-12-26 DIAGNOSIS — Z01818 Encounter for other preprocedural examination: Secondary | ICD-10-CM

## 2023-12-26 DIAGNOSIS — E119 Type 2 diabetes mellitus without complications: Secondary | ICD-10-CM | POA: Diagnosis not present

## 2023-12-26 DIAGNOSIS — J45909 Unspecified asthma, uncomplicated: Secondary | ICD-10-CM | POA: Diagnosis not present

## 2023-12-26 DIAGNOSIS — Z794 Long term (current) use of insulin: Secondary | ICD-10-CM | POA: Diagnosis not present

## 2023-12-26 HISTORY — PX: SHOULDER CLOSED REDUCTION: SHX1051

## 2023-12-26 HISTORY — DX: Other specified postprocedural states: Z98.890

## 2023-12-26 HISTORY — DX: Nausea with vomiting, unspecified: R11.2

## 2023-12-26 HISTORY — PX: POSTERIOR LUMBAR FUSION 2 WITH HARDWARE REMOVAL: SHX7297

## 2023-12-26 LAB — GLUCOSE, CAPILLARY
Glucose-Capillary: 103 mg/dL — ABNORMAL HIGH (ref 70–99)
Glucose-Capillary: 150 mg/dL — ABNORMAL HIGH (ref 70–99)

## 2023-12-26 SURGERY — ARTHROSCOPY, SHOULDER WITH DEBRIDEMENT
Anesthesia: General | Site: Shoulder | Laterality: Right

## 2023-12-26 MED ORDER — DEXAMETHASONE SODIUM PHOSPHATE 10 MG/ML IJ SOLN
INTRAMUSCULAR | Status: DC | PRN
  Administered 2023-12-26: 10 mg via INTRAVENOUS

## 2023-12-26 MED ORDER — DIPHENHYDRAMINE HCL 50 MG/ML IJ SOLN
INTRAMUSCULAR | Status: AC
  Filled 2023-12-26: qty 1

## 2023-12-26 MED ORDER — PHENYLEPHRINE HCL-NACL 20-0.9 MG/250ML-% IV SOLN
INTRAVENOUS | Status: DC | PRN
  Administered 2023-12-26: 80 ug via INTRAVENOUS
  Administered 2023-12-26: 160 ug via INTRAVENOUS
  Administered 2023-12-26: 20 ug/min via INTRAVENOUS

## 2023-12-26 MED ORDER — CEFAZOLIN SODIUM-DEXTROSE 2-4 GM/100ML-% IV SOLN
INTRAVENOUS | Status: AC
Start: 2023-12-26 — End: 2023-12-26
  Filled 2023-12-26: qty 100

## 2023-12-26 MED ORDER — GABAPENTIN 300 MG PO CAPS
300.0000 mg | ORAL_CAPSULE | Freq: Once | ORAL | Status: AC
  Administered 2023-12-26: 300 mg via ORAL

## 2023-12-26 MED ORDER — ONDANSETRON HCL 4 MG/2ML IJ SOLN
INTRAMUSCULAR | Status: DC | PRN
  Administered 2023-12-26: 4 mg via INTRAVENOUS

## 2023-12-26 MED ORDER — FENTANYL CITRATE (PF) 100 MCG/2ML IJ SOLN: 25.0000 ug | INTRAMUSCULAR | Status: DC | PRN

## 2023-12-26 MED ORDER — MIDAZOLAM HCL 2 MG/2ML IJ SOLN
2.0000 mg | Freq: Once | INTRAMUSCULAR | Status: AC
Start: 2023-12-26 — End: 2023-12-26
  Administered 2023-12-26: 2 mg via INTRAVENOUS

## 2023-12-26 MED ORDER — FENTANYL CITRATE (PF) 100 MCG/2ML IJ SOLN
INTRAMUSCULAR | Status: AC
Start: 2023-12-26 — End: 2023-12-26
  Filled 2023-12-26: qty 2

## 2023-12-26 MED ORDER — ONDANSETRON HCL 4 MG/2ML IJ SOLN
4.0000 mg | Freq: Once | INTRAMUSCULAR | Status: AC
  Administered 2023-12-26: 4 mg via INTRAVENOUS

## 2023-12-26 MED ORDER — PROPOFOL 10 MG/ML IV BOLUS
INTRAVENOUS | Status: DC | PRN
  Administered 2023-12-26: 200 mg via INTRAVENOUS

## 2023-12-26 MED ORDER — ACETAMINOPHEN 10 MG/ML IV SOLN: 1000.0000 mg | Freq: Once | INTRAVENOUS | Status: DC | PRN

## 2023-12-26 MED ORDER — LACTATED RINGERS IV SOLN: INTRAVENOUS | Status: DC

## 2023-12-26 MED ORDER — TRANEXAMIC ACID-NACL 1000-0.7 MG/100ML-% IV SOLN
INTRAVENOUS | Status: AC
  Filled 2023-12-26: qty 100

## 2023-12-26 MED ORDER — PHENYLEPHRINE 80 MCG/ML (10ML) SYRINGE FOR IV PUSH (FOR BLOOD PRESSURE SUPPORT)
PREFILLED_SYRINGE | INTRAVENOUS | Status: AC
  Filled 2023-12-26: qty 10

## 2023-12-26 MED ORDER — FENTANYL CITRATE (PF) 100 MCG/2ML IJ SOLN
100.0000 ug | Freq: Once | INTRAMUSCULAR | Status: AC
  Administered 2023-12-26: 100 ug via INTRAVENOUS

## 2023-12-26 MED ORDER — OXYCODONE HCL 5 MG PO TABS: 5.0000 mg | ORAL_TABLET | Freq: Once | ORAL | Status: DC | PRN

## 2023-12-26 MED ORDER — SODIUM CHLORIDE 0.9 % IR SOLN
Status: DC | PRN
  Administered 2023-12-26: 3000 mL

## 2023-12-26 MED ORDER — GABAPENTIN 300 MG PO CAPS
ORAL_CAPSULE | ORAL | Status: AC
  Filled 2023-12-26: qty 1

## 2023-12-26 MED ORDER — ONDANSETRON HCL 4 MG/2ML IJ SOLN
INTRAMUSCULAR | Status: AC
  Filled 2023-12-26: qty 2

## 2023-12-26 MED ORDER — AMISULPRIDE (ANTIEMETIC) 5 MG/2ML IV SOLN
10.0000 mg | Freq: Once | INTRAVENOUS | Status: AC
  Administered 2023-12-26: 10 mg via INTRAVENOUS

## 2023-12-26 MED ORDER — BUPIVACAINE-EPINEPHRINE (PF) 0.5% -1:200000 IJ SOLN
INTRAMUSCULAR | Status: DC | PRN
  Administered 2023-12-26: 20 mL via PERINEURAL

## 2023-12-26 MED ORDER — AMISULPRIDE (ANTIEMETIC) 5 MG/2ML IV SOLN
INTRAVENOUS | Status: AC
  Filled 2023-12-26: qty 4

## 2023-12-26 MED ORDER — SUGAMMADEX SODIUM 200 MG/2ML IV SOLN
INTRAVENOUS | Status: DC | PRN
  Administered 2023-12-26: 200 mg via INTRAVENOUS

## 2023-12-26 MED ORDER — CEFAZOLIN SODIUM-DEXTROSE 2-4 GM/100ML-% IV SOLN
2.0000 g | INTRAVENOUS | Status: AC
  Administered 2023-12-26: 2 g via INTRAVENOUS

## 2023-12-26 MED ORDER — FENTANYL CITRATE (PF) 250 MCG/5ML IJ SOLN
INTRAMUSCULAR | Status: DC | PRN
  Administered 2023-12-26: 100 ug via INTRAVENOUS

## 2023-12-26 MED ORDER — ACETAMINOPHEN 500 MG PO TABS
1000.0000 mg | ORAL_TABLET | Freq: Once | ORAL | Status: AC
  Administered 2023-12-26: 1000 mg via ORAL

## 2023-12-26 MED ORDER — DIPHENHYDRAMINE HCL 50 MG/ML IJ SOLN
25.0000 mg | Freq: Once | INTRAMUSCULAR | Status: AC
  Administered 2023-12-26: 25 mg via INTRAVENOUS

## 2023-12-26 MED ORDER — MIDAZOLAM HCL 2 MG/2ML IJ SOLN
INTRAMUSCULAR | Status: AC
  Filled 2023-12-26: qty 2

## 2023-12-26 MED ORDER — FENTANYL CITRATE (PF) 100 MCG/2ML IJ SOLN
INTRAMUSCULAR | Status: AC
  Filled 2023-12-26: qty 2

## 2023-12-26 MED ORDER — FLUCONAZOLE 100 MG PO TABS
100.0000 mg | ORAL_TABLET | Freq: Every day | ORAL | 0 refills | Status: AC
  Filled 2023-12-26: qty 30, 30d supply, fill #0

## 2023-12-26 MED ORDER — BUPIVACAINE LIPOSOME 1.3 % IJ SUSP
INTRAMUSCULAR | Status: DC | PRN
Start: 2023-12-26 — End: 2023-12-26
  Administered 2023-12-26: 10 mL via PERINEURAL

## 2023-12-26 MED ORDER — ACETAMINOPHEN 500 MG PO TABS
ORAL_TABLET | ORAL | Status: AC
  Filled 2023-12-26: qty 2

## 2023-12-26 MED ORDER — ROCURONIUM BROMIDE 10 MG/ML (PF) SYRINGE
PREFILLED_SYRINGE | INTRAVENOUS | Status: DC | PRN
  Administered 2023-12-26: 80 mg via INTRAVENOUS

## 2023-12-26 MED ORDER — TRANEXAMIC ACID-NACL 1000-0.7 MG/100ML-% IV SOLN
1000.0000 mg | INTRAVENOUS | Status: AC
  Administered 2023-12-26: 1000 mg via INTRAVENOUS

## 2023-12-26 MED ORDER — OXYCODONE HCL 5 MG/5ML PO SOLN: 5.0000 mg | Freq: Once | ORAL | Status: DC | PRN

## 2023-12-26 SURGICAL SUPPLY — 60 items
ANCHOR PEEK 4.75X19.1 SWLK C (Anchor) IMPLANT
BENZOIN TINCTURE PRP APPL 2/3 (GAUZE/BANDAGES/DRESSINGS) IMPLANT
BLADE EXCALIBUR 4.0X13 (MISCELLANEOUS) ×1 IMPLANT
BLADE SURG 15 STRL LF DISP TIS (BLADE) IMPLANT
BURR OVAL 8 FLU 4.0X13 (MISCELLANEOUS) ×1 IMPLANT
CANNULA 5.75X71 LONG (CANNULA) IMPLANT
CANNULA PASSPORT 5 (CANNULA) IMPLANT
CANNULA PASSPORT BUTTON 10-40 (CANNULA) IMPLANT
CANNULA TWIST IN 8.25X7CM (CANNULA) IMPLANT
CHLORAPREP W/TINT 26 (MISCELLANEOUS) ×2 IMPLANT
CLSR STERI-STRIP ANTIMIC 1/2X4 (GAUZE/BANDAGES/DRESSINGS) IMPLANT
COOLER ICEMAN CLASSIC (MISCELLANEOUS) ×1 IMPLANT
DRAPE IMP U-DRAPE 54X76 (DRAPES) ×1 IMPLANT
DRAPE INCISE IOBAN 66X45 STRL (DRAPES) ×1 IMPLANT
DRAPE SHOULDER BEACH CHAIR (DRAPES) ×1 IMPLANT
DRAPE U-SHAPE 47X51 STRL (DRAPES) ×2 IMPLANT
DW OUTFLOW CASSETTE/TUBE SET (MISCELLANEOUS) ×1 IMPLANT
ELECTRODE REM PT RTRN 9FT ADLT (ELECTROSURGICAL) ×1 IMPLANT
GAUZE PAD ABD 8X10 STRL (GAUZE/BANDAGES/DRESSINGS) ×1 IMPLANT
GAUZE SPONGE 4X4 12PLY STRL (GAUZE/BANDAGES/DRESSINGS) ×1 IMPLANT
GAUZE XEROFORM 1X8 LF (GAUZE/BANDAGES/DRESSINGS) ×1 IMPLANT
GLOVE BIO SURGEON STRL SZ 6 (GLOVE) ×2 IMPLANT
GLOVE BIO SURGEON STRL SZ7.5 (GLOVE) ×2 IMPLANT
GLOVE BIOGEL PI IND STRL 6.5 (GLOVE) ×1 IMPLANT
GLOVE BIOGEL PI IND STRL 8 (GLOVE) ×1 IMPLANT
GOWN STRL REUS W/ TWL LRG LVL3 (GOWN DISPOSABLE) ×2 IMPLANT
GOWN STRL REUS W/ TWL XL LVL3 (GOWN DISPOSABLE) ×1 IMPLANT
GOWN STRL REUS W/TWL XL LVL3 (GOWN DISPOSABLE) ×1 IMPLANT
KIT STABILIZATION SHOULDER (MISCELLANEOUS) ×1 IMPLANT
KIT STR SPEAR 1.8 FBRTK DISP (KITS) IMPLANT
LASSO 90 CVE QUICKPAS (DISPOSABLE) IMPLANT
LASSO CRESCENT QUICKPASS (SUTURE) IMPLANT
LOOP 2 FIBERLINK CLOSED (SUTURE) IMPLANT
MANIFOLD NEPTUNE II (INSTRUMENTS) ×1 IMPLANT
NDL HD SCORPION MEGA LOADER (NEEDLE) IMPLANT
PACK ARTHROSCOPY DSU (CUSTOM PROCEDURE TRAY) ×1 IMPLANT
PACK BASIN DAY SURGERY FS (CUSTOM PROCEDURE TRAY) ×1 IMPLANT
PAD COLD SHLDR WRAP-ON (PAD) ×1 IMPLANT
PENCIL SMOKE EVACUATOR (MISCELLANEOUS) IMPLANT
RESTRAINT HEAD UNIVERSAL NS (MISCELLANEOUS) ×1 IMPLANT
SHEET MEDIUM DRAPE 40X70 STRL (DRAPES) ×1 IMPLANT
SLEEVE SCD COMPRESS KNEE MED (STOCKING) ×1 IMPLANT
SLING ARM FOAM STRAP LRG (SOFTGOODS) IMPLANT
SPONGE T-LAP 4X18 ~~LOC~~+RFID (SPONGE) ×1 IMPLANT
SUT ETHILON 3 0 PS 1 (SUTURE) ×1 IMPLANT
SUT MON AB 4-0 PS1 27 (SUTURE) IMPLANT
SUT PDS AB 1 CT 36 (SUTURE) IMPLANT
SUT TIGER TAPE 7 IN WHITE (SUTURE) IMPLANT
SUT VIC AB 0 CT1 27XBRD ANBCTR (SUTURE) IMPLANT
SUT VIC AB 0 CT2 27 (SUTURE) IMPLANT
SUT VIC AB 2-0 SH 27XBRD (SUTURE) IMPLANT
SUTURE FIBERWR #2 38 T-5 BLUE (SUTURE) IMPLANT
SUTURE TAPE 1.3 40 TPR END (SUTURE) IMPLANT
SUTURE TAPE TIGERLINK 1.3MM BL (SUTURE) IMPLANT
TAPE FIBER 2MM 7IN #2 BLUE (SUTURE) IMPLANT
TOWEL GREEN STERILE FF (TOWEL DISPOSABLE) ×2 IMPLANT
TUBE CONNECTING 20X1/4 (TUBING) ×1 IMPLANT
TUBING ARTHROSCOPY IRRIG 16FT (MISCELLANEOUS) ×1 IMPLANT
WAND ABLATOR APOLLO I90 (BUR) ×1 IMPLANT
YANKAUER SUCT BULB TIP NO VENT (SUCTIONS) IMPLANT

## 2023-12-26 NOTE — Discharge Instructions (Addendum)
 Discharge Instructions    Attending Surgeon: Elspeth Parker, MD Office Phone Number: [phone]   Diagnosis and Procedures:    Surgeries Performed: Right shoulder debridement, biceps tenodesis  Discharge Plan:    Diet: Resume usual diet. Begin with light or bland foods.  Drink plenty of fluids.  Activity:  Keep sling and dressing in place until your follow up visit in Physical Therapy You are advised to go home directly from the hospital or surgical center. Restrict your activities.  GENERAL INSTRUCTIONS: 1.  Keep your surgical site elevated above your heart for at least 5-7 days or longer to prevent swelling. This will improve your comfort and your overall recovery following surgery.     2. Please call Dr. Danetta office at [phone] with questions Monday-Friday during business hours. If no one answers, please leave a message and someone should get back to the patient within 24 hours. For emergencies please call 911 or proceed to the emergency room.   3. Patient to notify surgical team if experiences any of the following: Bowel/Bladder dysfunction, uncontrolled pain, nerve/muscle weakness, incision with increased drainage or redness, nausea/vomiting and Fever greater than 101.0 F.  Be alert for signs of infection including redness, streaking, odor, fever or chills. Be alert for excessive pain or bleeding and notify your surgeon immediately.  WOUND INSTRUCTIONS:   Leave your dressing/cast/splint in place until your post operative visit.  Keep it clean and dry.  Always keep the incision clean and dry until the staples/sutures are removed. If there is no drainage from the incision you should keep it open to air. If there is drainage from the incision you must keep it covered at all times until the drainage stops  Do not soak in a bath tub, hot tub, pool, lake or other body of water until 21 days after your surgery and your incision is completely dry and healed.  If you  have removable sutures (or staples) they must be removed 10-14 days (unless otherwise instructed) from the day of your surgery.     1)  Elevate the extremity as much as possible.  2)  Keep the dressing clean and dry.  3)  Please call us  if the dressing becomes wet or dirty.  4)  If you are experiencing worsening pain or worsening swelling, please call.     MEDICATIONS: Resume all previous home medications at the previous prescribed dose and frequency unless otherwise noted Start taking the  pain medications on an as-needed basis as prescribed  Please taper down pain medication over the next week following surgery.  Ideally you should not require a refill of any narcotic pain medication.  Take pain medication with food to minimize nausea. In addition to the prescribed pain medication, you may take over-the-counter pain relievers such as Tylenol .  Do NOT take additional tylenol  if your pain medication already has tylenol  in it.  Aspirin  325mg  daily per bottle instructions. Narcotic Policy: Per [facility] clinic policy, our goal is ensure optimal postoperative pain control with a multimodal pain management strategy. For all [facility] patients, our goal is to wean post-operative narcotic medications by 6 weeks post-operatively, and many times sooner. If this is not possible due to utilization of pain medication prior to surgery, your [facility] doctor will support your acute post-operative pain control for the first 6 weeks postoperatively, with a plan to transition you back to your primary pain team following that. Maralee will work to ensure a Therapist, occupational.  FOLLOWUP INSTRUCTIONS: 1. Follow up at the Physical Therapy Clinic 3-4 days following surgery. This appointment should be scheduled unless other arrangements have been made.The Physical Therapy scheduling number is [phone] if an appointment has not already been arranged.  2. Contact Dr. Danetta office during office hours at  [phone] or the practice after hours line at [phone] for non-emergencies. For medical emergencies call 911.   Discharge Location: Home   Post Anesthesia Home Care Instructions  Activity: Get plenty of rest for the remainder of the day. A responsible individual must stay with you for 24 hours following the procedure.  For the next 24 hours, DO NOT: -Drive a car -Advertising copywriter -Drink alcoholic beverages -Take any medication unless instructed by your physician -Make any legal decisions or sign important papers.  Meals: Start with liquid foods such as gelatin or soup. Progress to regular foods as tolerated. Avoid greasy, spicy, heavy foods. If nausea and/or vomiting occur, drink only clear liquids until the nausea and/or vomiting subsides. Call your physician if vomiting continues.  Special Instructions/Symptoms: Your throat may feel dry or sore from the anesthesia or the breathing tube placed in your throat during surgery. If this causes discomfort, gargle with warm salt water. The discomfort should disappear within 24 hours.  If you had a scopolamine  patch placed behind your ear for the management of post- operative nausea and/or vomiting:  1. The medication in the patch is effective for 72 hours, after which it should be removed.  Wrap patch in a tissue and discard in the trash. Wash hands thoroughly with soap and water. 2. You may remove the patch earlier than 72 hours if you experience unpleasant side effects which may include dry mouth, dizziness or visual disturbances. 3. Avoid touching the patch. Wash your hands with soap and water after contact with the patch.    Regional Anesthesia Blocks  1. You may not be able to move or feel the blocked extremity after a regional anesthetic block. This may last may last from 3-48 hours after placement, but it will go away. The length of time depends on the medication injected and your individual response to the medication. As  the nerves start to wake up, you may experience tingling as the movement and feeling returns to your extremity. If the numbness and inability to move your extremity has not gone away after 48 hours, please call your surgeon.   2. The extremity that is blocked will need to be protected until the numbness is gone and the strength has returned. Because you cannot feel it, you will need to take extra care to avoid injury. Because it may be weak, you may have difficulty moving it or using it. You may not know what position it is in without looking at it while the block is in effect.  3. For blocks in the legs and feet, returning to weight bearing and walking needs to be done carefully. You will need to wait until the numbness is entirely gone and the strength has returned. You should be able to move your leg and foot normally before you try and bear weight or walk. You will need someone to be with you when you first try to ensure you do not fall and possibly risk injury.  4. Bruising and tenderness at the needle site are common side effects and will resolve in a few days.  5. Persistent numbness or new problems with movement should be communicated to the surgeon or the [facility] [phone] [facility] [phone]).  Information for Discharge Teaching: EXPAREL  (bupivacaine  liposome injectable suspension)   Pain relief is important to your recovery. The goal is to control your pain so you can move easier and return to your normal activities as soon as possible after your procedure. Your physician may use several types of medicines to manage pain, swelling, and more.  Your surgeon or anesthesiologist gave you EXPAREL (bupivacaine ) to help control your pain after surgery.  EXPAREL  is a local anesthetic designed to release slowly over an extended period of time to provide pain relief by numbing the tissue around the surgical site. EXPAREL  is designed to release pain  medication over time and can control pain for up to 72 hours. Depending on how you respond to EXPAREL , you may require less pain medication during your recovery. EXPAREL  can help reduce or eliminate the need for opioids during the first few days after surgery when pain relief is needed the most. EXPAREL  is not an opioid and is not addictive. It does not cause sleepiness or sedation.   Important! A teal colored band has been placed on your arm with the date, time and amount of EXPAREL  you have received. Please leave this armband in place for the full 96 hours following administration, and then you may remove the band. If you return to the hospital for any reason within 96 hours following the administration of EXPAREL , the armband provides important information that your health care providers to know, and alerts them that you have received this anesthetic.    Possible side effects of EXPAREL : Temporary loss of sensation or ability to move in the area where medication was injected. Nausea, vomiting, constipation Rarely, numbness and tingling in your mouth or lips, lightheadedness, or anxiety may occur. Call your doctor right away if you think you may be experiencing any of these sensations, or if you have other questions regarding possible side effects.  Follow all other discharge instructions given to you by your surgeon or nurse. Eat a healthy diet and drink plenty of water or other fluids.  Next dose of tylenol  will be at 8;12pm Next dose of ibuprofen  if needed will be at 10:12pm

## 2023-12-26 NOTE — H&P (Signed)
 Post Operative Evaluation      Procedure/Date of Surgery:  right rotator cuff repair 10/31     Interval History:    Presents today for continued status post right shoulder rotator repair recovery.  At this time she is having difficulty with persistent pain with overhead range of motion.  Unfortunately we are not able to do an ultrasound-guided injection with steroid due to her blood sugar.   PMH/PSH/Family History/Social History/Meds/Allergies:         Past Medical History:  Diagnosis Date   Allergy     Anemia     Anxiety      claustrophobic   Asthma     Back pain     Biceps tendonosis of right shoulder     Cataract      Mixed OU   COVID-19      covid PNA hospitalized 2020, 06/06/21   Diabetes mellitus 2011    did not start metforfin, losing weight   Difficult intubation      tooth got chipped one time   Dyspnea     Essential hypertension     Fatty liver     Food allergy     Frozen shoulder      left, had steroid injection 10/05/21   Headache disorder     History of concussion     HLD (hyperlipidemia)     Hypertensive retinopathy      OU   IBS (irritable bowel syndrome)     Infertility, female     Joint pain     Lactose intolerance     Leg edema     Migraines      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   Palpitation     Post-menopausal     Seborrheic dermatitis     Shoulder impingement syndrome, right     Type 2 diabetes mellitus with complication, with long-term current use of insulin  (HCC) 2018   Uveitis      Vowinckel eye est in 2020 seen specialist in [place] [facility] last seen 11 or 05/2020   Vitamin D  deficiency               Past Surgical History:  Procedure Laterality Date   ABDOMINAL HYSTERECTOMY   2006    heavy menses, endometriosis, l oophrectomy   BREAST BIOPSY Right 2018    benign   BREAST EXCISIONAL BIOPSY Right 2003    benign   BREAST EXCISIONAL BIOPSY Right 1999    benign   COLONOSCOPY    10/06/2020   INGUINAL HERNIA REPAIR Left 2003   LEFT OOPHORECTOMY   2006   LUMBAR FUSION   2005    L5-S1   SHOULDER ARTHROSCOPY WITH ROTATOR CUFF REPAIR AND SUBACROMIAL DECOMPRESSION   04/06/2023    Procedure: SHOULDER ARTHROSCOPY WITH ROTATOR CUFF REPAIR , EXTENSIVE DEBRIDEMENT, AND SUBACROMIAL DECOMPRESSION;  Surgeon: Genelle Standing, MD;  Location: [facility];  Service: Orthopedics;;   SHOULDER ARTHROSCOPY WITH SUBACROMIAL DECOMPRESSION AND OPEN ROTATOR C Right 07/14/2016    Procedure: RIGHT SHOULDER ARTHROSCOPY WITH SUBACROMIAL DECOMPRESSION, DISTAL CLAVICLE RESECTION AND MINI OPEN ROTATOR CUFF REPAIR, OPEN BICEP TENDODESIS;  Surgeon: Maude LELON Right, MD;  Location: [facility];  Service: Orthopedics;  Laterality: Right;   SHOULDER CLOSED REDUCTION Right 09/08/2016  Procedure: RIGHT CLOSED MANIPULATION SHOULDER;  Surgeon: Maude LELON Right, MD;  Location: [facility];  Service: Orthopedics;  Laterality: Right;   TUBAL LIGATION       UPPER GASTROINTESTINAL ENDOSCOPY   10/06/2020        Social History         Socioeconomic History   Marital status: Divorced      Spouse name: Not on file   Number of children: 2   Years of education: 14   Highest education level: Master's degree (e.g., MA, MS, MEng, MEd, MSW, MBA)  Occupational History   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  Has a son and a daughter.    Right-handed.    1-3 cups caffeine  weekly.    Social Drivers of Manufacturing engineer Strain: Not on file  Food Insecurity: Not on file  Transportation Needs: Not on file   Physical Activity: Not on file  Stress: Not on file  Social Connections: Not on file         Family History  Problem Relation Age of Onset   Diabetes Mother     Heart disease Mother 69   Hypertension Mother     Hyperlipidemia Mother     Heart failure Mother     Stroke Mother     Thyroid  disease Mother     Depression Mother     Sleep apnea Mother     Obesity Mother     Colon polyps Mother     Cancer Father 1        Lung Cancer   Heart disease Father     Mental illness Sister          bipolar, substance abuse,  clean 2 yrs   Hypertension Sister     Cancer Paternal Grandmother 60        breast cancer   Breast cancer Paternal Grandmother     Diabetes Maternal Grandmother     Hypertension Maternal Grandmother     Diabetes Son     Colon cancer Neg Hx     Esophageal cancer Neg Hx     Rectal cancer Neg Hx     Stomach cancer Neg Hx          Allergies       Allergies  Allergen Reactions   Bamlanivimab  Anaphylaxis, Itching, Palpitations, Rash, Shortness Of Breath and Swelling   Botox [Onabotulinumtoxina ] Shortness Of Breath      syncope   Clindamycin/Lincomycin Other (See Comments)   Contrast Media [Iodinated Contrast Media] Shortness Of Breath and Palpitations   Kiwi Extract Shortness Of Breath   Maxalt [Rizatriptan Benzoate] Anaphylaxis      Chest pain   Nitrous Oxide Shortness Of Breath      syncope   Rizatriptan Benzoate Anaphylaxis      Chest pain   Triptans Shortness Of Breath   Vyepti  [Eptinezumab -Jjmr] Shortness Of Breath, Palpitations and Other (See Comments)      Throat soreness, tachycardia  Aspirin  Nausea And Vomiting      Mouth blisters   Haemophilus Influenzae Vaccines     Latex        Sometimes causes rash, welts   Mango Flavoring Agent (Non-Screening) Swelling      Lips swell   Vicodin [Hydrocodone-Acetaminophen ]        hallucinations   Erythromycin Rash   Flagyl  [Metronidazole ] Rash   Influenza Virus Vaccine Palpitations   Penicillins  Nausea And Vomiting, Rash and Other (See Comments)      Did it involve swelling of the face/tongue/throat, SOB, or low BP? No Did it involve sudden or severe rash/hives, skin peeling, or any reaction on the inside of your mouth or nose? No Did you need to seek medical attention at a hospital or doctor's office? Yes When did it last happen?     patient was 53 years old  If all above answers are "NO", may proceed with cephalosporin use.   Tetracyclines & Related Nausea And Vomiting and Rash            Current Outpatient Medications  Medication Sig Dispense Refill   acetaminophen  (TYLENOL ) 500 MG tablet Take 1 tablet (500 mg total) by mouth every 8 (eight) hours for 10 days. 30 tablet 0   aspirin  EC 325 MG tablet Take 1 tablet (325 mg total) by mouth daily. 14 tablet 0   ibuprofen  (ADVIL ) 800 MG tablet Take 1 tablet (800 mg total) by mouth every 8 (eight) hours for 10 days. Please take with food, please alternate with acetaminophen  30 tablet 0   oxyCODONE  (ROXICODONE ) 5 MG immediate release tablet Take 1 tablet (5 mg total) by mouth every 4 (four) hours as needed for severe pain (pain score 7-10) or breakthrough pain. 20 tablet 0   albuterol  (VENTOLIN  HFA) 108 (90 Base) MCG/ACT inhaler Inhale 2 puffs into the lungs every 6 (six) hours as needed for wheezing or shortness of breath. 18 g 0   aspirin  EC 81 MG tablet Take 1 tablet (81 mg total) by mouth daily. Swallow whole. 90 tablet 3   Atogepant  (QULIPTA ) 60 MG TABS Take 1 tablet (60 mg total) by mouth daily. (Patient taking differently: Take 1 tablet by mouth at bedtime.) 30 tablet 11   atorvastatin  (LIPITOR) 20 MG tablet Take 1 tablet (20 mg total) by mouth daily. (Patient taking differently: Take 20 mg by mouth at bedtime.) 90 tablet 3   butalbital -acetaminophen -caffeine  (FIORICET ) 50-325-40 MG tablet Take 1 tablet by mouth every 6 (six) hours as needed for headache. Do not refill in less than 30 day 30 tablet 5   celecoxib  (CELEBREX ) 200 MG  capsule Take 1 capsule (200 mg total) by mouth 2 (two) times daily as needed for arm pain 60 capsule 2   Continuous Glucose Sensor (DEXCOM G7 SENSOR) MISC Apply 1 sensor every 10 days. 9 each 4   diazepam  (VALIUM ) 5 MG tablet Take 1 tablet (5 mg total) by mouth every 12 (twelve) hours as needed for anxiety. 30 tablet 5   dicyclomine  (BENTYL ) 20 MG tablet Take 1 tablet (20 mg total) by mouth every 6 (six) hours. (Patient taking differently: Take 20 mg by mouth 3 (three) times daily as needed (as needed).) 90 tablet 1   EPINEPHrine  0.3 mg/0.3 mL IJ SOAJ injection Inject 0.3 mg into the muscle as needed for anaphylaxis. 2 each 0   fluconazole  (DIFLUCAN ) 150 MG tablet Take 1 tablet (150 mg total) by mouth daily. 5 tablet 0   gabapentin  (NEURONTIN )  100 MG capsule Take 1 capsule (100 mg total) by mouth 3 (three) times daily. (Patient taking differently: Take 100 mg by mouth 3 (three) times daily. As needed) 30 capsule 1   Ipratropium-Albuterol  (COMBIVENT ) 20-100 MCG/ACT AERS respimat Inhale 1 puff into the lungs every 6 (six) hours. (Patient taking differently: Inhale 1 puff into the lungs every 6 (six) hours as needed for shortness of breath.) 4 g 11   losartan  (COZAAR ) 100 MG tablet Take 1 tablet (100 mg total) by mouth daily. 90 tablet 1   metFORMIN  (GLUCOPHAGE -XR) 500 MG 24 hr tablet Take 2 tablets (1,000 mg total) by mouth 2 times daily with a meal (at breakfast and at dinner) 360 tablet 0   metoprolol  succinate (TOPROL -XL) 25 MG 24 hr tablet Take 0.5 tablets (12.5 mg total) by mouth daily. 45 tablet 0   ondansetron  (ZOFRAN -ODT) 4 MG disintegrating tablet Take 1 tablet (4 mg total) by mouth every 8 (eight) hours as needed for nausea or vomiting. 20 tablet 0   oxyCODONE  (ROXICODONE ) 5 MG immediate release tablet Take 1 tablet (5 mg total) by mouth every 4 (four) hours as needed for severe pain (pain score 7-10) or breakthrough pain. 15 tablet 0   pantoprazole  (PROTONIX ) 40 MG tablet Take 1 tablet (40 mg  total) by mouth daily. 30 tablet 3   pregabalin  (LYRICA ) 25 MG capsule Take 1 capsule (25 mg total) by mouth at bedtime. 90 capsule 3   Semaglutide , 1 MG/DOSE, (OZEMPIC , 1 MG/DOSE,) 4 MG/3ML SOPN Inject 1 mg into the skin once a week as directed. 9 mL 1   Ubrogepant  (UBRELVY ) 100 MG TABS Take 1 tablet (100 mg total) by mouth as needed. May repeat dose in 2 hours if needed. Max dose 2 tablets in 2 hours 16 tablet 11   Vitamin D , Ergocalciferol , (DRISDOL ) 1.25 MG (50000 UNIT) CAPS capsule Take 1 capsule by mouth once a week. (Patient taking differently: Take 50,000 Units by mouth once a week. Sunday) 4 capsule 11      No current facility-administered medications for this visit.      Imaging Results (Last 48 hours)  No results found.       Review of Systems:   A ROS was performed including pertinent positives and negatives as documented in the HPI.     Musculoskeletal Exam:     There were no vitals taken for this visit.   Right shoulder incisions are well-appearing without erythema or drainage.  She does have paresthesias down the middle finger and ring finger of the right hand.  She is able to fire wrist extensors as well as thumb extensor.  She is able to fire at the biceps.  Remainder of sensory exam is intact.  Active forward elevation is passively to approximately 100 degrees with a hard endpoint.  Pain with external rotation at the side of more than 40 degrees.  Internal rotation is to L1 again with pain.  This is significantly painful as well as with external rotation of the side       Imaging:       MRI right shoulder: Thickened inferior glenohumeral ligament consistent with adhesive capsulitis.  Her repair does appear to be well intact   I personally reviewed and interpreted the radiographs.     Assessment:   Status post right shoulder rotator cuff repair with significant pain concerning for possible capsulitis.  At this time she is not getting any improvement with physical  therapy and does have a hard  endpoint with limited range of motion.  Given the fact that she is not a candidate for shoulder injection due to the impact this has on her blood sugar we will plan to proceed with right shoulder arthroscopy with lysis of adhesions and manipulation.  I did discuss the risk and limitations associated with this well associate recovery.  After discussion she would like to proceed Plan :     - Plan for right shoulder arthroscopy with lysis of adhesions and manipulation under anesthesia     After a lengthy discussion of treatment options, including risks, benefits, alternatives, complications of surgical and nonsurgical conservative options, the patient elected surgical repair.    The patient  is aware of the material risks  and complications including, but not limited to injury to adjacent structures, neurovascular injury, infection, numbness, bleeding, implant failure, thermal burns, stiffness, persistent pain, failure to heal, disease transmission from allograft, need for further surgery, dislocation, anesthetic risks, blood clots, risks of death,and others. The probabilities of surgical success and failure discussed with patient given their particular co-morbidities.The time and nature of expected rehabilitation and recovery was discussed.The patient's questions were all answered preoperatively.  No barriers to understanding were noted. I explained the natural history of the disease process and Rx rationale.  I explained to the patient what I considered to be reasonable expectations given their personal situation.  The final treatment plan was arrived at through a shared patient decision making process model.             I personally saw and evaluated the patient, and participated in the management and treatment plan.   Elspeth Parker, MD Attending Physician, Orthopedic Surgery   This document was dictated using Dragon voice recognition software. A reasonable attempt  at proof reading has been made to minimize errors.

## 2023-12-26 NOTE — Brief Op Note (Signed)
   Brief Op Note  Date of Surgery: 12/26/2023  Preoperative Diagnosis: RIGHT ADHESIVE CAPSULITIS  Postoperative Diagnosis: same  Procedure: Procedure(s): ARTHROSCOPY, SHOULDER WITH DEBRIDEMENT MANIPULATION, JOINT, SHOULDER, WITH ANESTHESIA  Implants: Implant Name Type Inv. Item Serial No. Manufacturer Lot No. LRB No. Used Action  ANCHOR PEEK 4.75X19.1 [employer] - [id number] Anchor ANCHOR PEEK 4.75X19.1 [employer] [id number] Right 1 Implanted    Surgeons: Surgeon(s): Genelle Standing, MD  Anesthesia: General    Estimated Blood Loss: See anesthesia record  Complications: None  Condition to PACU: Stable  Standing LITTIE Genelle, MD 12/26/2023 4:58 PM

## 2023-12-26 NOTE — Progress Notes (Signed)
Assisted Dr. Moser with right, interscalene , ultrasound guided block. Side rails up, monitors on throughout procedure. See vital signs in flow sheet. Tolerated Procedure well. 

## 2023-12-26 NOTE — Anesthesia Procedure Notes (Signed)
 Procedure Name: Intubation Date/Time: 12/26/2023 4:14 PM  Performed by: Maudine Grayce ORN, CRNAPre-anesthesia Checklist: Patient identified, Emergency Drugs available, Suction available and Patient being monitored Patient Re-evaluated:Patient Re-evaluated prior to induction Oxygen Delivery Method: Circle System Utilized Preoxygenation: Pre-oxygenation with 100% oxygen Induction Type: IV induction Ventilation: Mask ventilation without difficulty Laryngoscope Size: Glidescope and 3 Grade View: Grade I Tube type: Oral Tube size: 7.0 mm Number of attempts: 1 Airway Equipment and Method: Stylet and Oral airway Placement Confirmation: ETT inserted through vocal cords under direct vision, positive ETCO2 and breath sounds checked- equal and bilateral Secured at: 21 cm Tube secured with: Tape Dental Injury: Teeth and Oropharynx as per pre-operative assessment

## 2023-12-26 NOTE — Transfer of Care (Signed)
 Immediate Anesthesia Transfer of Care Note  Patient: [name]  Procedure(s) Performed: ARTHROSCOPY, SHOULDER WITH DEBRIDEMENT BICEPS TENODESIS (Right: Shoulder) MANIPULATION, JOINT, SHOULDER, WITH ANESTHESIA (Right: Shoulder)  Patient Location: PACU  Anesthesia Type:General and Regional  Level of Consciousness: drowsy  Airway & Oxygen Therapy: Patient Spontanous Breathing and Patient connected to face mask oxygen  Post-op Assessment: Report given to RN and Post -op Vital signs reviewed and stable  Post vital signs: Reviewed and stable  Last Vitals:  Vitals Value Taken Time  BP 153/69 12/26/23 17:16  Temp    Pulse 78 12/26/23 17:20  Resp    SpO2 100 % 12/26/23 17:20  Vitals shown include unfiled device data.  Last Pain:  Vitals:   12/26/23 1545  TempSrc:   PainSc: 0-No pain      Patients Stated Pain Goal: 3 (12/26/23 1409)  Complications: No notable events documented.

## 2023-12-26 NOTE — Op Note (Signed)
 Date of Surgery: 12/26/2023  INDICATIONS: [name] is a 53 y.o.-year-old female with right shoulder adhesive capsulitis.  The risk and benefits of the procedure were discussed in detail and documented in the pre-operative evaluation.   PREOPERATIVE DIAGNOSES: Right shoulder, biceps tendinitis and capsulitis.  POSTOPERATIVE DIAGNOSIS: Same.  PROCEDURE: Arthroscopic extensive debridement - 29823 Subdeltoid Bursa, Supraspinatus Tendon, Anterior Labrum, and tendon interval Arthroscopic biceps tenodesis - 70171  SURGEON: Elspeth LITTIE Parker MD  ASSISTANT: Conley Funk, ATC  ANESTHESIA:  general  IV FLUIDS AND URINE: See anesthesia record.  ANTIBIOTICS: Ancef   ESTIMATED BLOOD LOSS: 10 mL.  IMPLANTS:  Implant Name Type Inv. Item Serial No. Manufacturer Lot No. LRB No. Used Action  ANCHOR PEEK 4.75X19.1 SWLK C - [id number] Anchor ANCHOR PEEK 4.75X19.1 SWLK C  [employer] [id number] Right 1 Implanted    DRAINS: None  CULTURES: None  COMPLICATIONS: none  PROCEDURE:    OPERATIVE FINDING: Exam under anesthesia:   Examination under anesthesia revealed forward elevation of 150 degrees.  With the arm at the side, there was 65 degrees of external rotation.  There is a 1+ anterior load shift and a 1+ posterior load shift.    Arthroscopic findings demonstrated: Articular space: Rotator interval, injected Chondral surfaces: Normal Biceps:, Injected Subscapularis: Intact Supraspinatus: Less than 10% undersurface tearing Infraspinatus: Intact    I identified the patient in the pre-operative holding area.  I marked the operative right shoulder with my initials. I reviewed the risks and benefits of the proposed surgical intervention and the patient wished to proceed.  Anesthesia was then performed with regional block.  The patient was transferred to the operative suite and placed in the beach chair position with all bony prominences padded.     SCDs were placed on bilateral lower  extremity. Appropriate antibiotics was administered within 1 hour before incision.  Anesthesia was induced.  The operative extremity was then prepped and draped in standard fashion. A time out was performed confirming the correct extremity, correct patient and correct procedure.   The arthroscope was introduced in the glenohumeral joint from a posterior portal.  An anterior portal was created.  The shoulder was examined and the above findings were noted.     With an arthroscopic shaver and a wand ablator, synovitis throughout the  shoulder was resected.  The arthroscopic shaver was used to excise torn portions of the labrum back to a stable margin. Specifically this was done for the anterior and superior labrum. The rotator interval was debrided using shaver and electrocautery.  At this time the biceps was tagged with a self passing device with a #2 nonabsorbable suture twice.  This was done through the anterior portal.  This was placed in a single 0.75 mm swivel lock  The rotator cuff was then approached through the subacromial space. Anterior, lateral, and posterior portals were used.  Bursectomy was performed with an arthroscopic shaver and Ar[employer]Care wand.     The shoulder was irrigated.  The arthroscopic instruments were removed.  Wounds were closed with 3-0 nylon sutures.  A sterile dressing was applied with xeroform, 4x8s, abdominal pad, and tape. An Rosalita was placed and the upper extremity was placed in a shoulder immobilizer.  The patient tolerated the procedure well and was taken to the recovery room in stable condition.  All counts were correct in the case. The patient tolerated the procedure well and was taken to the recovery room in stable condition.    POSTOPERATIVE PLAN: She will be weightbearing  as tolerated in the right shoulder active range of motion as tolerated once her nerve block wears off.  She was placed on 2 weeks of aspirin  for blood clot prevention.  She will begin physical  therapy immediately postop  Elspeth LITTIE Parker, MD 4:59 PM

## 2023-12-26 NOTE — Anesthesia Procedure Notes (Signed)
 Anesthesia Regional Block: Interscalene brachial plexus block   Pre-Anesthetic Checklist: , timeout performed,  Correct Patient, Correct Site, Correct Laterality,  Correct Procedure, Correct Position, site marked,  Risks and benefits discussed,  Surgical consent,  Pre-op evaluation,  At surgeon's request and post-op pain management  Laterality: Right and Upper  Prep: chloraprep       Needles:  Injection technique: Single-shot      Needle Length: 5cm  Needle Gauge: 22     Additional Needles: Arrow StimuQuik ECHO Echogenic Stimulating PNB Needle  Procedures:,,,, ultrasound used (permanent image in chart),,    Narrative:  Start time: 12/26/2023 3:41 PM End time: 12/26/2023 3:50 PM Injection made incrementally with aspirations every 5 mL.  Performed by: Personally  Anesthesiologist: Leopoldo Bruckner, MD

## 2023-12-27 ENCOUNTER — Encounter (HOSPITAL_BASED_OUTPATIENT_CLINIC_OR_DEPARTMENT_OTHER): Admitting: Orthopaedic Surgery

## 2023-12-27 ENCOUNTER — Encounter (HOSPITAL_BASED_OUTPATIENT_CLINIC_OR_DEPARTMENT_OTHER): Payer: Self-pay | Admitting: Orthopaedic Surgery

## 2023-12-27 ENCOUNTER — Encounter: Admitting: Rehabilitative and Restorative Service Providers"

## 2023-12-28 ENCOUNTER — Other Ambulatory Visit (HOSPITAL_COMMUNITY): Payer: Self-pay

## 2023-12-28 ENCOUNTER — Other Ambulatory Visit: Payer: Self-pay

## 2023-12-28 ENCOUNTER — Encounter: Payer: Self-pay | Admitting: Internal Medicine

## 2023-12-28 ENCOUNTER — Encounter: Payer: Self-pay | Admitting: Physical Therapy

## 2023-12-28 ENCOUNTER — Ambulatory Visit: Admitting: Physical Therapy

## 2023-12-28 DIAGNOSIS — M6281 Muscle weakness (generalized): Secondary | ICD-10-CM

## 2023-12-28 DIAGNOSIS — M25611 Stiffness of right shoulder, not elsewhere classified: Secondary | ICD-10-CM

## 2023-12-28 DIAGNOSIS — G8929 Other chronic pain: Secondary | ICD-10-CM | POA: Diagnosis not present

## 2023-12-28 DIAGNOSIS — M25511 Pain in right shoulder: Secondary | ICD-10-CM | POA: Diagnosis not present

## 2023-12-28 MED ORDER — TIRZEPATIDE 5 MG/0.5ML ~~LOC~~ SOAJ
5.0000 mg | SUBCUTANEOUS | 1 refills | Status: DC
  Filled 2023-12-28: qty 2, 28d supply, fill #0
  Filled 2024-01-24: qty 2, 28d supply, fill #1
  Filled 2024-02-23: qty 2, 28d supply, fill #2
  Filled 2024-03-25: qty 2, 28d supply, fill #3
  Filled 2024-04-24: qty 2, 28d supply, fill #4
  Filled 2024-05-20: qty 2, 28d supply, fill #5

## 2023-12-28 NOTE — Therapy (Signed)
 OUTPATIENT PHYSICAL THERAPY EVALUATION   Patient Name: [name] MRN: [id number] DOB:09-May-1971, 53 y.o., female Today's Date: 12/28/2023  END OF SESSION:  PT End of Session - 12/28/23 0849     Visit Number 1    Number of Visits 16    Date for PT Re-Evaluation 02/22/24    Authorization Type [employer] [facility] $25 copay    Authorization Time Period 25 PT/OT    PT Start Time [phone]    PT Stop Time 0920    PT Time Calculation (min) 34 min    Activity Tolerance Patient tolerated treatment well    Behavior During Therapy [facility] for tasks assessed/performed          Past Medical History:  Diagnosis Date   Allergy    Anemia    Anxiety    claustrophobic   Asthma    Back pain    Biceps tendonosis of right shoulder    Cataract    Mixed OU   COVID-19    covid PNA hospitalized 2020, 06/06/21   Diabetes mellitus 2011   did not start metforfin, losing weight   Difficult intubation    tooth got chipped one time   Dyspnea    Essential hypertension    Fatty liver    Food allergy    Frozen shoulder    left, had steroid injection 10/05/21   Headache disorder    History of concussion    HLD (hyperlipidemia)    Hypertensive retinopathy    OU   IBS (irritable bowel syndrome)    Infertility, female    Joint pain    Lactose intolerance    Leg edema    Migraines    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   Palpitation    PONV (postoperative nausea and vomiting)    Post-menopausal    Seborrheic dermatitis    Shoulder impingement syndrome, right    Type 2 diabetes mellitus with complication, with long-term current use of insulin  (HCC) 2018   Uveitis    [place] [facility] est in 2020 seen specialist in [place] [facility] last seen 11 or 05/2020   Vitamin D  deficiency    Past Surgical History:  Procedure Laterality Date   ABDOMINAL HYSTERECTOMY  2006   heavy menses, endometriosis, l oophrectomy   BREAST BIOPSY Right 2018   benign   BREAST EXCISIONAL BIOPSY Right 2003    benign   BREAST EXCISIONAL BIOPSY Right 1999   benign   COLONOSCOPY  10/06/2020   INGUINAL HERNIA REPAIR Left 2003   LEFT OOPHORECTOMY  2006   LUMBAR FUSION  2005   L5-S1   POSTERIOR LUMBAR FUSION 2 WITH HARDWARE REMOVAL Right 12/26/2023   Procedure: ARTHROSCOPY, SHOULDER WITH DEBRIDEMENT BICEPS TENODESIS;  Surgeon: Genelle Standing, MD;  Location: [facility];  Service: Orthopedics;  Laterality: Right;  RIGHT SHOULDER ARTHROSCOPY WITH LYSIS OF ADHESIONS / MANIPULATION UNDER ANESTHESIA   SHOULDER ARTHROSCOPY WITH ROTATOR CUFF REPAIR AND SUBACROMIAL DECOMPRESSION  04/06/2023   Procedure: SHOULDER ARTHROSCOPY WITH ROTATOR CUFF REPAIR , EXTENSIVE DEBRIDEMENT, AND SUBACROMIAL DECOMPRESSION;  Surgeon: Genelle Standing, MD;  Location: [facility];  Service: Orthopedics;;   SHOULDER ARTHROSCOPY WITH SUBACROMIAL DECOMPRESSION AND OPEN ROTATOR C Right 07/14/2016   Procedure: RIGHT SHOULDER ARTHROSCOPY WITH SUBACROMIAL DECOMPRESSION, DISTAL CLAVICLE RESECTION AND MINI OPEN ROTATOR CUFF REPAIR, OPEN BICEP TENDODESIS;  Surgeon: Maude LELON Right, MD;  Location: [facility];  Service: Orthopedics;  Laterality: Right;   SHOULDER CLOSED REDUCTION Right 09/08/2016  Procedure: RIGHT CLOSED MANIPULATION SHOULDER;  Surgeon: Maude LELON Right, MD;  Location: [facility];  Service: Orthopedics;  Laterality: Right;   SHOULDER CLOSED REDUCTION Right 12/26/2023   Procedure: MANIPULATION, JOINT, SHOULDER, WITH ANESTHESIA;  Surgeon: Genelle Standing, MD;  Location: [facility];  Service: Orthopedics;  Laterality: Right;   TUBAL LIGATION     UPPER GASTROINTESTINAL ENDOSCOPY  10/06/2020   Patient Active Problem List   Diagnosis Date Noted   Biceps tendinitis of right upper extremity 12/26/2023   Adhesive capsulitis of right shoulder 12/26/2023   Traumatic incomplete tear of right rotator cuff 04/06/2023   Vertigo 07/03/2022   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 09/21/2021   Chronic left shoulder pain 09/21/2021   Fatty infiltration of liver 07/26/2020   Diarrhea, functional 06/22/2020   Uveitis, intermediate, bilateral 09/07/2019   Generalized edema 06/13/2019   Hospital discharge follow-up 06/07/2019   Multinodular goiter 12/05/2017   Microalbuminuria due to type 2 diabetes mellitus (HCC) 11/26/2017   Encounter for preventive health examination 11/26/2017   Menopause syndrome 06/10/2017   Adverse reaction to vaccine, sequela 03/25/2017   Atypical chest pain 02/27/2017   PSVT (paroxysmal supraventricular tachycardia) (HCC) 11/26/2016   Nontraumatic incomplete tear of right rotator cuff 07/14/2016   AC (acromioclavicular) arthritis 07/14/2016   Impingement syndrome of right shoulder 07/14/2016   Fibrocystic breast changes, right 05/21/2016   Insomnia 10/16/2015   Snoring 07/26/2015   Chronic migraine w/o aura w/o status migrainosus, not intractable 02/24/2015   History of concussion 07/21/2014   Hyperlipidemia associated with type 2 diabetes mellitus (HCC) 06/13/2014   Vitamin D  deficiency 05/07/2013   Chronic migraine 11/20/2012   S/P Total Abdominal Hysterectomy and Left Salpingo-oophorectomy 10/20/2011   Headache around the eyes    Essential hypertension 07/20/2011    PCP: Marylynn Verneita CROME, MD  REFERRING PROVIDER: Genelle Standing, MD  REFERRING DIAG: [phone] (ICD-10-CM) - Traumatic incomplete tear of right rotator cuff, initial encounter  Rationale for Evaluation and Treatment: Rehabilitation  THERAPY DIAG:  Muscle weakness (generalized) - Plan: PT plan of care cert/re-cert  Chronic right shoulder pain - Plan: PT plan of care cert/re-cert  Stiffness of right shoulder, not elsewhere classified - Plan: PT plan of care cert/re-cert  ONSET DATE: DOS: 12/26/23   SUBJECTIVE:                                                                                                                                                                                            SUBJECTIVE STATEMENT: [name] is a 53 y.o.-year-old female with right shoulder adhesive capsulitis s/p MUA  with bicep tenodesis on 12/26/23.  She's had some headaches and nausea since surgery.  She still has tingling in her fingers, and the shoulder is still tight  PERTINENT HISTORY:  Rt RTC repair, type 2 DM, HTN  PAIN:  Are you having pain? Yes: NPRS scale: 5 currently, up to 9/10 Pain location: Rt shoulder Pain description: throbbing, tightness, tingling, sticking: Aggravating factors: activity in general Relieving factors: pain medications  PRECAUTIONS:  Shoulder - biceps tenodesis  RED FLAGS: None   WEIGHT BEARING RESTRICTIONS:  No  FALLS:  Has patient fallen in last 6 months? No  LIVING ENVIRONMENT: Lives with: lives with their daughter (home from college, moves back to college 8/21)  OCCUPATION:  Arts development officer  PLOF:  Independent and Leisure: go to the gun range  PATIENT GOALS:  Regain function of Rt shoulder/arm   OBJECTIVE:  Note: Objective measures were completed at Evaluation unless otherwise noted.   PATIENT SURVEYS:  Patient-Specific Activity Scoring Scheme  0 represents "unable to perform." 10 represents "able to perform at prior level. 0 1 2 3 4 5 6 7 8 9  10 (Date and Score)   Activity Eval     1. Shower 3    2. Hair Care 3    3. IADLs 3   Score 3    Total score = sum of the activity scores/number of activities Minimum detectable change (90%CI) for average score = 2 points Minimum detectable change (90%CI) for single activity score = 3 points    COGNITIVE STATUS: Within functional limits for tasks assessed   SENSATION: WFL  POSTURE:  rounded shoulders and forward head  HAND DOMINANCE:  Right  GAIT: 12/28/23 Comments: independent   EDEMA: 12/28/23 Dressing still in place; swelling noted in Rt shoulder   UPPER EXTREMITY ROM:  ROM Right eval   Shoulder flexion P: 112 (Unable to perform A)  Shoulder extension   Shoulder abduction P: 74  Shoulder adduction   Shoulder extension   Shoulder internal rotation P: 45  Shoulder external rotation P: 38   (Blank rows = not tested)   UPPER EXTREMITY MMT:  12/28/23: not formally tested due to post op status; difficult with AROM at this time  MMT Right eval Left eval  Shoulder flexion    Shoulder extension    Shoulder abduction    Shoulder adduction    Shoulder extension    Shoulder internal rotation    Shoulder external rotation    Middle trapezius    Lower trapezius    Elbow flexion    Elbow extension    Wrist flexion    Wrist extension    Wrist ulnar deviation    Wrist radial deviation    Wrist pronation    Wrist supination    Grip strength     (Blank rows = not tested)      TREATMENT:  DATE:  12/28/23 TherEx See HEP - demonstrated with trial reps performed PRN, mod cues for comprehension    Self Care Educated on clinical findings and PT POC - pt verbalized understanding     PATIENT EDUCATION:  Education details: HEP, Person educated: Patient Education method: Explanation, Demonstration, and Handouts Education comprehension: verbalized understanding, returned demonstration, and needs further education  HOME EXERCISE PROGRAM: Access Code: [email or web address] URL: [email or web address] Date: 12/28/2023 Prepared by: Corean Ku  Exercises - Flexion-Extension Shoulder Pendulum with Table Support  - 3-4 x daily - 7 x weekly - 1 sets - 10 reps - Horizontal Shoulder Pendulum with Table Support  - 3-4 x daily - 7 x weekly - 1 sets - 10 reps - Circular Shoulder Pendulum with Table Support  - 3-4 x daily - 7 x weekly - 1 sets - 10 reps - Supine Shoulder Press with Dowel  - 3-4 x daily - 7 x weekly - 1 sets - 10 reps - Supine  Shoulder Flexion with Dowel  - 3-4 x daily - 7 x weekly - 1 sets - 10 reps - Supine Shoulder External Rotation in 45 Degrees Abduction AAROM with Dowel  - 3-4 x daily - 7 x weekly - 1 sets - 10 reps - Standing Shoulder Abduction AAROM with Dowel  - 3-4 x daily - 7 x weekly - 1 sets - 10 reps   ASSESSMENT:  CLINICAL IMPRESSION: Patient is a 53 y.o. female who was seen today for physical therapy evaluation and treatment for s/p Rt shoulder MUA with biceps tenodesis. She demonstrates decreased ROM and strength as well as expected post op pain and swelling affecting functional mobility.  She will benefit from PT to address deficits listed.     OBJECTIVE IMPAIRMENTS: decreased ROM, decreased strength, increased edema, increased muscle spasms, impaired flexibility, impaired UE functional use, postural dysfunction, and pain.   ACTIVITY LIMITATIONS: carrying, lifting, sleeping, bed mobility, bathing, toileting, dressing, self feeding, reach over head, and hygiene/grooming  PARTICIPATION LIMITATIONS: meal prep, cleaning, laundry, driving, shopping, community activity, and occupation  PERSONAL FACTORS: Behavior pattern, Past/current experiences, Time since onset of injury/illness/exacerbation, and 3+ comorbidities: Rt RTC repair, type 2 DM, HTN are also affecting patient's functional outcome.   REHAB POTENTIAL: Good  CLINICAL DECISION MAKING: Evolving/moderate complexity  EVALUATION COMPLEXITY: Moderate   GOALS: Goals reviewed with patient? Yes  SHORT TERM GOALS: Target date: 01/25/2024  Independent with initial HEP Goal status: INITIAL  2.  Rt shoulder PROM improved by 15 deg al directions for improved mobility Goal status: INITIAL  LONG TERM GOALS: Target date: 02/22/2024   Independent with final HEP Goal status: INITIAL  2.  PSFS score improved by 3 points Goal status: INITIAL  3.  Rt shoulder AROM improved to [facility] for improved function and mobility Goal status: INIITAL  4.   Report pain < 3/10 with ADLs for improved function Goal status: INITIAL  5.  Demonstrate at least 4/5 Rt shoulder strength for improved function Goal status: INITIAL    PLAN:  PT FREQUENCY: 1-2x/week  PT DURATION: 8 weeks  PLANNED INTERVENTIONS: 97164- PT Re-evaluation, 97750- Physical Performance Testing, 97110-Therapeutic exercises, 97530- Therapeutic activity, 97112- Neuromuscular re-education, 97535- Self Care, 02859- Manual therapy, [phone]- Aquatic Therapy, H9716- Electrical stimulation (unattended), 97016- Vasopneumatic device, 20560 (1-2 muscles), 20561 (3+ muscles)- Dry Needling, Patient/Family education, Taping, Joint mobilization, Joint manipulation, Scar mobilization, Cryotherapy, and Moist heat.  PLAN FOR NEXT SESSION: Review HEP, ROM (A/AA) as tolerated   NEXT MD VISIT: 01/05/24  Corean JULIANNA Ku, PT, DPT 12/28/23 10:11 AM

## 2023-12-29 NOTE — Anesthesia Postprocedure Evaluation (Signed)
 Anesthesia Post Note  Patient: [name]  Procedure(s) Performed: ARTHROSCOPY, SHOULDER WITH DEBRIDEMENT BICEPS TENODESIS (Right: Shoulder) MANIPULATION, JOINT, SHOULDER, WITH ANESTHESIA (Right: Shoulder)     Patient location during evaluation: PACU Anesthesia Type: General and Regional Level of consciousness: awake and alert Pain management: pain level controlled Vital Signs Assessment: post-procedure vital signs reviewed and stable Respiratory status: spontaneous breathing, nonlabored ventilation and respiratory function stable Cardiovascular status: blood pressure returned to baseline and stable Postop Assessment: no apparent nausea or vomiting Anesthetic complications: no   No notable events documented.  Last Vitals:  Vitals:   12/26/23 1800 12/26/23 1821  BP: (!) 153/85 (!) 142/82  Pulse: 71 71  Resp: (!) 21 18  Temp:  (!) 36.3 C  SpO2: 94% 94%    Last Pain:  Vitals:   12/26/23 1409  TempSrc: Temporal                 Daurice Ovando

## 2023-12-30 ENCOUNTER — Other Ambulatory Visit (HOSPITAL_COMMUNITY): Payer: Self-pay

## 2024-01-01 ENCOUNTER — Other Ambulatory Visit (HOSPITAL_COMMUNITY): Payer: Self-pay

## 2024-01-01 ENCOUNTER — Encounter (HOSPITAL_BASED_OUTPATIENT_CLINIC_OR_DEPARTMENT_OTHER): Payer: Self-pay | Admitting: Orthopaedic Surgery

## 2024-01-05 ENCOUNTER — Other Ambulatory Visit (HOSPITAL_BASED_OUTPATIENT_CLINIC_OR_DEPARTMENT_OTHER): Payer: Self-pay

## 2024-01-05 ENCOUNTER — Ambulatory Visit (HOSPITAL_BASED_OUTPATIENT_CLINIC_OR_DEPARTMENT_OTHER): Admitting: Orthopaedic Surgery

## 2024-01-05 DIAGNOSIS — M7501 Adhesive capsulitis of right shoulder: Secondary | ICD-10-CM

## 2024-01-05 MED ORDER — ACETAMINOPHEN 500 MG PO TABS
500.0000 mg | ORAL_TABLET | Freq: Three times a day (TID) | ORAL | 0 refills | Status: DC
  Filled 2024-01-05: qty 30, 10d supply, fill #0

## 2024-01-05 MED ORDER — OXYCODONE HCL 5 MG PO TABS
5.0000 mg | ORAL_TABLET | ORAL | 0 refills | Status: DC | PRN
  Filled 2024-01-05: qty 15, 3d supply, fill #0

## 2024-01-05 MED ORDER — ASPIRIN 325 MG PO TBEC
325.0000 mg | DELAYED_RELEASE_TABLET | Freq: Every day | ORAL | 0 refills | Status: DC
Start: 2024-01-05 — End: 2024-01-05
  Filled 2024-01-05: qty 14, 14d supply, fill #0

## 2024-01-05 NOTE — Progress Notes (Signed)
 Post Operative Evaluation    Procedure/Date of Surgery: Right shoulder capsular release, biceps tenodesis 7/22  Interval History:    Presents 2 weeks status post the above procedure.  Overall she is doing much better.  She does feel better than she does prior to surgery   PMH/PSH/Family History/Social History/Meds/Allergies:    Past Medical History:  Diagnosis Date   Allergy    Anemia    Anxiety    claustrophobic   Asthma    Back pain    Biceps tendonosis of right shoulder    Cataract    Mixed OU   COVID-19    covid PNA hospitalized 2020, 06/06/21   Diabetes mellitus 2011   did not start metforfin, losing weight   Difficult intubation    tooth got chipped one time   Dyspnea    Essential hypertension    Fatty liver    Food allergy    Frozen shoulder    left, had steroid injection 10/05/21   Headache disorder    History of concussion    HLD (hyperlipidemia)    Hypertensive retinopathy    OU   IBS (irritable bowel syndrome)    Infertility, female    Joint pain    Lactose intolerance    Leg edema    Migraines    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   Palpitation    PONV (postoperative nausea and vomiting)    Post-menopausal    Seborrheic dermatitis    Shoulder impingement syndrome, right    Type 2 diabetes mellitus with complication, with long-term current use of insulin  (HCC) 2018   Uveitis    [place] [facility] est in 2020 seen specialist in [place] [facility] last seen 11 or 05/2020   Vitamin D  deficiency    Past Surgical History:  Procedure Laterality Date   ABDOMINAL HYSTERECTOMY  2006   heavy menses, endometriosis, l oophrectomy   BREAST BIOPSY Right 2018   benign   BREAST EXCISIONAL BIOPSY Right 2003   benign   BREAST EXCISIONAL BIOPSY Right 1999   benign   COLONOSCOPY  10/06/2020   INGUINAL HERNIA REPAIR Left 2003   LEFT OOPHORECTOMY  2006   LUMBAR FUSION  2005   L5-S1   POSTERIOR LUMBAR FUSION 2  WITH HARDWARE REMOVAL Right 12/26/2023   Procedure: ARTHROSCOPY, SHOULDER WITH DEBRIDEMENT BICEPS TENODESIS;  Surgeon: Genelle Standing, MD;  Location: [facility];  Service: Orthopedics;  Laterality: Right;  RIGHT SHOULDER ARTHROSCOPY WITH LYSIS OF ADHESIONS / MANIPULATION UNDER ANESTHESIA   SHOULDER ARTHROSCOPY WITH ROTATOR CUFF REPAIR AND SUBACROMIAL DECOMPRESSION  04/06/2023   Procedure: SHOULDER ARTHROSCOPY WITH ROTATOR CUFF REPAIR , EXTENSIVE DEBRIDEMENT, AND SUBACROMIAL DECOMPRESSION;  Surgeon: Genelle Standing, MD;  Location: [facility];  Service: Orthopedics;;   SHOULDER ARTHROSCOPY WITH SUBACROMIAL DECOMPRESSION AND OPEN ROTATOR C Right 07/14/2016   Procedure: RIGHT SHOULDER ARTHROSCOPY WITH SUBACROMIAL DECOMPRESSION, DISTAL CLAVICLE RESECTION AND MINI OPEN ROTATOR CUFF REPAIR, OPEN BICEP TENDODESIS;  Surgeon: Maude LELON Right, MD;  Location: [facility];  Service: Orthopedics;  Laterality: Right;   SHOULDER CLOSED REDUCTION Right 09/08/2016   Procedure: RIGHT CLOSED MANIPULATION SHOULDER;  Surgeon: Maude LELON Right, MD;  Location: [facility];  Service: Orthopedics;  Laterality: Right;   SHOULDER CLOSED REDUCTION Right 12/26/2023   Procedure: MANIPULATION,  JOINT, SHOULDER, WITH ANESTHESIA;  Surgeon: Genelle Standing, MD;  Location: [facility];  Service: Orthopedics;  Laterality: Right;   TUBAL LIGATION     UPPER GASTROINTESTINAL ENDOSCOPY  10/06/2020   Social History   Socioeconomic History   Marital status: Divorced    Spouse name: Not on file   Number of children: 2   Years of education: 14   Highest education level: Master's degree (e.g., MA, MS, MEng, MEd, MSW, MBA)  Occupational History   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  Has a son and a daughter.   Right-handed.   1-3 cups caffeine  weekly.   Social Drivers of Corporate investment banker Strain: Not on file  Food Insecurity: Not on file  Transportation Needs: Not on file  Physical Activity: Not on file  Stress: Not on file  Social Connections: Not on file   Family History  Problem Relation Age of Onset   Diabetes Mother    Heart disease Mother 22   Hypertension Mother    Hyperlipidemia Mother    Heart failure Mother    Stroke Mother    Thyroid  disease Mother    Depression Mother    Sleep apnea Mother    Obesity Mother    Colon polyps Mother    Cancer Father 20       Lung Cancer   Heart disease Father    Mental illness Sister        bipolar, substance abuse,  clean 2 yrs   Hypertension Sister    Cancer Paternal Grandmother 64       breast cancer   Breast cancer Paternal Grandmother    Diabetes Maternal Grandmother    Hypertension Maternal Grandmother    Diabetes Son    Colon cancer Neg Hx    Esophageal cancer Neg Hx    Rectal cancer Neg Hx    Stomach cancer Neg Hx    Allergies  Allergen Reactions   Bamlanivimab  Anaphylaxis, Itching, Palpitations, Rash, Shortness Of Breath and Swelling   Botox [Onabotulinumtoxina ] Shortness Of Breath    syncope   Clindamycin/Lincomycin Other (See Comments)   Contrast Media [Iodinated Contrast Media] Shortness Of Breath and Palpitations   Kiwi Extract Shortness Of Breath   Maxalt [Rizatriptan Benzoate] Anaphylaxis    Chest pain   Nitrous Oxide Shortness Of Breath    syncope   Rizatriptan Benzoate Anaphylaxis    Chest pain   Triptans Shortness Of Breath   Vyepti  [Eptinezumab -Jjmr] Shortness Of Breath, Palpitations and Other (See Comments)    Throat soreness, tachycardia   Aspirin  Nausea And Vomiting    Mouth blisters    Haemophilus Influenzae Vaccines    Latex     Sometimes causes rash, welts   Mango Flavoring Agent (Non-Screening) Swelling    Lips swell   Vicodin [Hydrocodone-Acetaminophen ]     hallucinations   Erythromycin Rash   Flagyl  [Metronidazole ] Rash   Influenza Virus Vaccine Palpitations   Penicillins Nausea And Vomiting, Rash and Other (See Comments)    Did it  involve swelling of the face/tongue/throat, SOB, or low BP? No Did it involve sudden or severe rash/hives, skin peeling, or any reaction on the inside of your mouth or nose? No Did you need to seek medical attention at a hospital or doctor's office? Yes When did it last happen?     patient was 53 years old  If all above answers are "NO", may proceed with cephalosporin use.   Tetracyclines & Related Nausea And Vomiting and Rash   Current Outpatient Medications  Medication Sig Dispense Refill   albuterol  (VENTOLIN  HFA) 108 (90 Base) MCG/ACT inhaler Inhale 2 puffs into the lungs every 6 (six) hours as needed for wheezing or shortness of breath. 18 g 0   Atogepant  (QULIPTA ) 60 MG TABS Take 1 tablet (60 mg total) by mouth daily. (Patient taking differently: Take 1 tablet by mouth at bedtime.) 30 tablet 11   atorvastatin  (LIPITOR) 20 MG tablet Take 1 tablet (20 mg total) by mouth daily. (Patient taking differently: Take 20 mg by mouth at bedtime.) 90 tablet 3   butalbital -acetaminophen -caffeine  (FIORICET ) 50-325-40 MG tablet Take 1 tablet by mouth every 6 (six) hours as needed for headache. Do not refill in less than 30 day 30 tablet 5   celecoxib  (CELEBREX ) 200 MG capsule Take 1 capsule (200 mg total) by mouth 2 (two) times daily as needed for arm pain 60 capsule 2   Continuous Glucose Sensor (DEXCOM G7 SENSOR) MISC Apply 1 sensor every 10 days. 9 each 4   diazepam  (VALIUM ) 5 MG tablet Take 1 tablet (5 mg total) by mouth every 12 (twelve) hours as needed for anxiety. 30 tablet 5   dicyclomine  (BENTYL ) 20 MG tablet Take 1 tablet (20 mg total) by  mouth every 6 (six) hours. (Patient taking differently: Take 20 mg by mouth 3 (three) times daily as needed (as needed).) 90 tablet 1   EPINEPHrine  0.3 mg/0.3 mL IJ SOAJ injection Inject 0.3 mg into the muscle as needed for anaphylaxis. 2 each 0   fluconazole  (DIFLUCAN ) 100 MG tablet Take 1 tablet (100 mg total) by mouth daily for 7 days. 30 tablet 0   fluconazole  (DIFLUCAN ) 150 MG tablet Take 1 tablet (150 mg total) by mouth daily. 5 tablet 0   gabapentin  (NEURONTIN ) 100 MG capsule Take 1 capsule (100 mg total) by mouth 3 (three) times daily. (Patient taking differently: Take 100 mg by mouth 3 (three) times daily. As needed) 30 capsule 1   Ipratropium-Albuterol  (COMBIVENT ) 20-100 MCG/ACT AERS respimat Inhale 1 puff into the lungs every 6 (six) hours. (Patient taking differently: Inhale 1 puff into the lungs every 6 (six) hours as needed for shortness of breath.) 4 g 11   losartan  (COZAAR ) 100 MG tablet Take 1 tablet (100 mg total) by mouth daily. 90 tablet 1   metFORMIN  (GLUCOPHAGE -XR) 500 MG 24 hr tablet Take 1 tablet (500 mg total) by mouth daily. 90 tablet 3   metoprolol  succinate (TOPROL -XL) 25 MG 24 hr tablet Take 0.5 tablets (12.5 mg total) by mouth daily. 45 tablet 0   ondansetron  (ZOFRAN -ODT) 4 MG disintegrating tablet Take 1 tablet (4 mg total) by mouth every 8 (eight) hours as needed for nausea or vomiting. 20 tablet 0   ondansetron  (ZOFRAN -ODT) 4 MG disintegrating tablet Dissolve 1 tablet (4 mg total) by mouth every 8 (eight) hours as needed for nausea or vomiting. 20 tablet 0   oxyCODONE  (ROXICODONE ) 5 MG immediate release tablet Take 1 tablet (5 mg total) by mouth every 4 (four)  hours as needed for severe pain (pain score 7-10) or breakthrough pain. 15 tablet 0   pantoprazole  (PROTONIX ) 40 MG tablet Take 1 tablet (40 mg total) by mouth daily. 30 tablet 3   pregabalin  (LYRICA ) 25 MG capsule Take 1 capsule (25 mg total) by mouth at bedtime. 90 capsule 3   tirzepatide  (MOUNJARO ) 5 MG/0.5ML  Pen Inject 5 mg into the skin once a week. 6 mL 1   Ubrogepant  (UBRELVY ) 100 MG TABS Take 1 tablet (100 mg total) by mouth as needed. May repeat dose in 2 hours if needed. Max dose 2 tablets in 2 hours 16 tablet 11   Vitamin D , Ergocalciferol , (DRISDOL ) 1.25 MG (50000 UNIT) CAPS capsule Take 1 capsule by mouth once a week. 4 capsule 11   No current facility-administered medications for this visit.   No results found.  Review of Systems:   A ROS was performed including pertinent positives and negatives as documented in the HPI.   Musculoskeletal Exam:    There were no vitals taken for this visit.  Right shoulder with significant improvement.  Active forward elevation is to 120 degrees with external Tatian of the side to 30 degrees.  Internal rotation deferred today.  Distal neurosensory exam is intact  Imaging:      I personally reviewed and interpreted the radiographs.   Assessment:   2 weeks status post  right shoulder debridement with biceps tenodesis doing extremely well.  Shoulder range of motion and strength are improving nicely.  I will plan to see her back in 4 weeks for reassessment  Plan :    - Return to clinic 4 weeks for reassessment      I personally saw and evaluated the patient, and participated in the management and treatment plan.  Elspeth Parker, MD Attending Physician, Orthopedic Surgery  This document was dictated using Dragon voice recognition software. A reasonable attempt at proof reading has been made to minimize errors.

## 2024-01-07 ENCOUNTER — Other Ambulatory Visit: Payer: Self-pay | Admitting: Internal Medicine

## 2024-01-08 ENCOUNTER — Other Ambulatory Visit (HOSPITAL_COMMUNITY): Payer: Self-pay

## 2024-01-08 MED ORDER — ATORVASTATIN CALCIUM 20 MG PO TABS
20.0000 mg | ORAL_TABLET | Freq: Every day | ORAL | 0 refills | Status: DC
  Filled 2024-01-08: qty 90, 90d supply, fill #0

## 2024-01-08 NOTE — Therapy (Signed)
 OUTPATIENT PHYSICAL THERAPY EVALUATION   Patient Name: [name] MRN: [id number] DOB:Oct 17, 1970, 53 y.o., female Today's Date: 01/09/2024  END OF SESSION:  PT End of Session - 01/09/24 0854     Visit Number 2    Number of Visits 16    Authorization Type [employer] [facility] $25 copay    PT Start Time 0735    PT Stop Time 0813    PT Time Calculation (min) 38 min    Activity Tolerance Patient tolerated treatment well    Behavior During Therapy [facility] for tasks assessed/performed           Past Medical History:  Diagnosis Date   Allergy    Anemia    Anxiety    claustrophobic   Asthma    Back pain    Biceps tendonosis of right shoulder    Cataract    Mixed OU   COVID-19    covid PNA hospitalized 2020, 06/06/21   Diabetes mellitus 2011   did not start metforfin, losing weight   Difficult intubation    tooth got chipped one time   Dyspnea    Essential hypertension    Fatty liver    Food allergy    Frozen shoulder    left, had steroid injection 10/05/21   Headache disorder    History of concussion    HLD (hyperlipidemia)    Hypertensive retinopathy    OU   IBS (irritable bowel syndrome)    Infertility, female    Joint pain    Lactose intolerance    Leg edema    Migraines    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   Palpitation    PONV (postoperative nausea and vomiting)    Post-menopausal    Seborrheic dermatitis    Shoulder impingement syndrome, right    Type 2 diabetes mellitus with complication, with long-term current use of insulin  (HCC) 2018   Uveitis    [place] [facility] est in 2020 seen specialist in [place] [facility] last seen 11 or 05/2020   Vitamin D  deficiency    Past Surgical History:  Procedure Laterality Date   ABDOMINAL HYSTERECTOMY  2006   heavy menses, endometriosis, l oophrectomy   BREAST BIOPSY Right 2018   benign   BREAST EXCISIONAL BIOPSY Right 2003   benign   BREAST EXCISIONAL BIOPSY Right 1999   benign   COLONOSCOPY   10/06/2020   INGUINAL HERNIA REPAIR Left 2003   LEFT OOPHORECTOMY  2006   LUMBAR FUSION  2005   L5-S1   POSTERIOR LUMBAR FUSION 2 WITH HARDWARE REMOVAL Right 12/26/2023   Procedure: ARTHROSCOPY, SHOULDER WITH DEBRIDEMENT BICEPS TENODESIS;  Surgeon: Genelle Standing, MD;  Location: [facility];  Service: Orthopedics;  Laterality: Right;  RIGHT SHOULDER ARTHROSCOPY WITH LYSIS OF ADHESIONS / MANIPULATION UNDER ANESTHESIA   SHOULDER ARTHROSCOPY WITH ROTATOR CUFF REPAIR AND SUBACROMIAL DECOMPRESSION  04/06/2023   Procedure: SHOULDER ARTHROSCOPY WITH ROTATOR CUFF REPAIR , EXTENSIVE DEBRIDEMENT, AND SUBACROMIAL DECOMPRESSION;  Surgeon: Genelle Standing, MD;  Location: [facility];  Service: Orthopedics;;   SHOULDER ARTHROSCOPY WITH SUBACROMIAL DECOMPRESSION AND OPEN ROTATOR C Right 07/14/2016   Procedure: RIGHT SHOULDER ARTHROSCOPY WITH SUBACROMIAL DECOMPRESSION, DISTAL CLAVICLE RESECTION AND MINI OPEN ROTATOR CUFF REPAIR, OPEN BICEP TENDODESIS;  Surgeon: Maude LELON Right, MD;  Location: [facility];  Service: Orthopedics;  Laterality: Right;   SHOULDER CLOSED REDUCTION Right 09/08/2016   Procedure: RIGHT CLOSED MANIPULATION SHOULDER;  Surgeon: Maude LELON Right, MD;  Location: [facility]  ;  Service: Orthopedics;  Laterality: Right;   SHOULDER CLOSED REDUCTION Right 12/26/2023   Procedure: MANIPULATION, JOINT, SHOULDER, WITH ANESTHESIA;  Surgeon: Genelle Standing, MD;  Location: [facility];  Service: Orthopedics;  Laterality: Right;   TUBAL LIGATION     UPPER GASTROINTESTINAL ENDOSCOPY  10/06/2020   Patient Active Problem List   Diagnosis Date Noted   Biceps tendinitis of right upper extremity 12/26/2023   Adhesive capsulitis of right shoulder 12/26/2023   Traumatic incomplete tear of right rotator cuff 04/06/2023   Vertigo 07/03/2022   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 09/21/2021   Chronic left shoulder pain  09/21/2021   Fatty infiltration of liver 07/26/2020   Diarrhea, functional 06/22/2020   Uveitis, intermediate, bilateral 09/07/2019   Generalized edema 06/13/2019   Hospital discharge follow-up 06/07/2019   Multinodular goiter 12/05/2017   Microalbuminuria due to type 2 diabetes mellitus (HCC) 11/26/2017   Encounter for preventive health examination 11/26/2017   Menopause syndrome 06/10/2017   Adverse reaction to vaccine, sequela 03/25/2017   Atypical chest pain 02/27/2017   PSVT (paroxysmal supraventricular tachycardia) (HCC) 11/26/2016   Nontraumatic incomplete tear of right rotator cuff 07/14/2016   AC (acromioclavicular) arthritis 07/14/2016   Impingement syndrome of right shoulder 07/14/2016   Fibrocystic breast changes, right 05/21/2016   Insomnia 10/16/2015   Snoring 07/26/2015   Chronic migraine w/o aura w/o status migrainosus, not intractable 02/24/2015   History of concussion 07/21/2014   Hyperlipidemia associated with type 2 diabetes mellitus (HCC) 06/13/2014   Vitamin D  deficiency 05/07/2013   Chronic migraine 11/20/2012   S/P Total Abdominal Hysterectomy and Left Salpingo-oophorectomy 10/20/2011   Headache around the eyes    Essential hypertension 07/20/2011    PCP: Marylynn Verneita CROME, MD  REFERRING PROVIDER: Genelle Standing, MD  REFERRING DIAG: S46.011A (ICD-10-CM) - Traumatic incomplete tear of right rotator cuff, initial encounter  Rationale for Evaluation and Treatment: Rehabilitation  THERAPY DIAG:  Muscle weakness (generalized)  Chronic right shoulder pain  Stiffness of right shoulder, not elsewhere classified  Edema, unspecified type  S/P right rotator cuff repair  Acute pain of right shoulder  ONSET DATE: DOS: 12/26/23   SUBJECTIVE:                                                                                                                                                                                           SUBJECTIVE STATEMENT: Pt  reports still having numbness and tingling in the R arm.  Feels more motion in the arm.  Eval- [name] is a 53 y.o.-year-old female with right shoulder adhesive capsulitis s/p  MUA with bicep tenodesis on 12/26/23.  She's had some headaches and nausea since surgery.  She still has tingling in her fingers, and the shoulder is still tight  PERTINENT HISTORY:  Rt RTC repair, type 2 DM, HTN  PAIN:  Are you having pain? Yes: NPRS scale: 4 currently, up to 9/10 Pain location: Rt shoulder Pain description: throbbing, tightness, tingling, sticking: Aggravating factors: activity in general Relieving factors: pain medications  PRECAUTIONS:  Shoulder - biceps tenodesis  RED FLAGS: None   WEIGHT BEARING RESTRICTIONS:  No  FALLS:  Has patient fallen in last 6 months? No  LIVING ENVIRONMENT: Lives with: lives with their daughter (home from college, moves back to college 8/21)  OCCUPATION:  Arts development officer  PLOF:  Independent and Leisure: go to the gun range  PATIENT GOALS:  Regain function of Rt shoulder/arm   OBJECTIVE:  Note: Objective measures were completed at Evaluation unless otherwise noted.   PATIENT SURVEYS:  Patient-Specific Activity Scoring Scheme  0 represents "unable to perform." 10 represents "able to perform at prior level. 0 1 2 3 4 5 6 7 8 9  10 (Date and Score)   Activity Eval     1. Shower 3    2. Hair Care 3    3. IADLs 3   Score 3    Total score = sum of the activity scores/number of activities Minimum detectable change (90%CI) for average score = 2 points Minimum detectable change (90%CI) for single activity score = 3 points    COGNITIVE STATUS: Within functional limits for tasks assessed   SENSATION: WFL  POSTURE:  rounded shoulders and forward head  HAND DOMINANCE:  Right  GAIT: 12/28/23 Comments: independent   EDEMA: 12/28/23 Dressing still in place; swelling noted in Rt shoulder   UPPER EXTREMITY  ROM:  ROM Right eval  Shoulder flexion P: 112 (Unable to perform A)  Shoulder extension   Shoulder abduction P: 74  Shoulder adduction   Shoulder extension   Shoulder internal rotation P: 45  Shoulder external rotation P: 38   (Blank rows = not tested)   UPPER EXTREMITY MMT:  12/28/23: not formally tested due to post op status; difficult with AROM at this time  MMT Right eval Left eval  Shoulder flexion    Shoulder extension    Shoulder abduction    Shoulder adduction    Shoulder extension    Shoulder internal rotation    Shoulder external rotation    Middle trapezius    Lower trapezius    Elbow flexion    Elbow extension    Wrist flexion    Wrist extension    Wrist ulnar deviation    Wrist radial deviation    Wrist pronation    Wrist supination    Grip strength     (Blank rows = not tested)      TREATMENT:  DATE:  01/09/24 There Ex Pendulums all directions with rest breaks Shoulder press with dowel 3x10 Shoulder flexion with dowel 2x10 Shoulder ER with dowel 2x10 Seated scapular shrugs Manual  Scap mobs and PROM    12/28/23 TherEx See HEP - demonstrated with trial reps performed PRN, mod cues for comprehension    Self Care Educated on clinical findings and PT POC - pt verbalized understanding     PATIENT EDUCATION:  Education details: HEP, Person educated: Patient Education method: Explanation, Demonstration, and Handouts Education comprehension: verbalized understanding, returned demonstration, and needs further education  HOME EXERCISE PROGRAM: Access Code: [email or web address] URL: [email or web address] Date: 12/28/2023 Prepared by: Corean Ku  Exercises - Flexion-Extension Shoulder Pendulum with Table Support  - 3-4 x daily - 7 x weekly - 1 sets - 10 reps - Horizontal Shoulder Pendulum with Table  Support  - 3-4 x daily - 7 x weekly - 1 sets - 10 reps - Circular Shoulder Pendulum with Table Support  - 3-4 x daily - 7 x weekly - 1 sets - 10 reps - Supine Shoulder Press with Dowel  - 3-4 x daily - 7 x weekly - 1 sets - 10 reps - Supine Shoulder Flexion with Dowel  - 3-4 x daily - 7 x weekly - 1 sets - 10 reps - Supine Shoulder External Rotation in 45 Degrees Abduction AAROM with Dowel  - 3-4 x daily - 7 x weekly - 1 sets - 10 reps - Standing Shoulder Abduction AAROM with Dowel  - 3-4 x daily - 7 x weekly - 1 sets - 10 reps   ASSESSMENT:  CLINICAL IMPRESSION: Pt needed VC for rest breaks and relaxation with pendulum activity.  Demonstrated understanding.  Pt reports feeling more mobile after treatment.     Eval-Patient is a 53 y.o. female who was seen today for physical therapy evaluation and treatment for s/p Rt shoulder MUA with biceps tenodesis. She demonstrates decreased ROM and strength as well as expected post op pain and swelling affecting functional mobility.  She will benefit from PT to address deficits listed.     OBJECTIVE IMPAIRMENTS: decreased ROM, decreased strength, increased edema, increased muscle spasms, impaired flexibility, impaired UE functional use, postural dysfunction, and pain.   ACTIVITY LIMITATIONS: carrying, lifting, sleeping, bed mobility, bathing, toileting, dressing, self feeding, reach over head, and hygiene/grooming  PARTICIPATION LIMITATIONS: meal prep, cleaning, laundry, driving, shopping, community activity, and occupation  PERSONAL FACTORS: Behavior pattern, Past/current experiences, Time since onset of injury/illness/exacerbation, and 3+ comorbidities: Rt RTC repair, type 2 DM, HTN are also affecting patient's functional outcome.   REHAB POTENTIAL: Good  CLINICAL DECISION MAKING: Evolving/moderate complexity  EVALUATION COMPLEXITY: Moderate   GOALS: Goals reviewed with patient? Yes  SHORT TERM GOALS: Target date: 01/25/2024  Independent  with initial HEP Goal status: INITIAL  2.  Rt shoulder PROM improved by 15 deg al directions for improved mobility Goal status: INITIAL  LONG TERM GOALS: Target date: 02/22/2024   Independent with final HEP Goal status: INITIAL  2.  PSFS score improved by 3 points Goal status: INITIAL  3.  Rt shoulder AROM improved to [facility] for improved function and mobility Goal status: INIITAL  4.  Report pain < 3/10 with ADLs for improved function Goal status: INITIAL  5.  Demonstrate at least 4/5 Rt shoulder strength for improved function Goal status: INITIAL    PLAN:  PT FREQUENCY: 1-2x/week  PT DURATION: 8 weeks  PLANNED INTERVENTIONS: 97164- PT Re-evaluation, 97750- Physical Performance Testing, 97110-Therapeutic exercises,  02469- Therapeutic activity, [id number]- Neuromuscular re-education, [id number]- Self Care, [id number]- Manual therapy, [id number]- Aquatic Therapy, [phone]- Electrical stimulation (unattended), 97016- Vasopneumatic device, [phone] (1-2 muscles), 20561 (3+ muscles)- Dry Needling, Patient/Family education, Taping, Joint mobilization, Joint manipulation, Scar mobilization, Cryotherapy, and Moist heat.  PLAN FOR NEXT SESSION:  ROM (A/AA) as tolerated   NEXT MD VISIT: 01/05/24   Burnard Meth, PT 01/09/24  8:57 AM

## 2024-01-09 ENCOUNTER — Ambulatory Visit (INDEPENDENT_AMBULATORY_CARE_PROVIDER_SITE_OTHER)

## 2024-01-09 DIAGNOSIS — M6281 Muscle weakness (generalized): Secondary | ICD-10-CM | POA: Diagnosis not present

## 2024-01-09 DIAGNOSIS — Z9889 Other specified postprocedural states: Secondary | ICD-10-CM | POA: Diagnosis not present

## 2024-01-09 DIAGNOSIS — R609 Edema, unspecified: Secondary | ICD-10-CM

## 2024-01-09 DIAGNOSIS — G8929 Other chronic pain: Secondary | ICD-10-CM | POA: Diagnosis not present

## 2024-01-09 DIAGNOSIS — M25611 Stiffness of right shoulder, not elsewhere classified: Secondary | ICD-10-CM | POA: Diagnosis not present

## 2024-01-09 DIAGNOSIS — M25511 Pain in right shoulder: Secondary | ICD-10-CM | POA: Diagnosis not present

## 2024-01-17 ENCOUNTER — Encounter: Payer: Self-pay | Admitting: Internal Medicine

## 2024-01-17 ENCOUNTER — Encounter: Admitting: Physical Therapy

## 2024-01-19 ENCOUNTER — Encounter: Payer: Self-pay | Admitting: Rehabilitative and Restorative Service Providers"

## 2024-01-19 ENCOUNTER — Ambulatory Visit (INDEPENDENT_AMBULATORY_CARE_PROVIDER_SITE_OTHER): Admitting: Rehabilitative and Restorative Service Providers"

## 2024-01-19 DIAGNOSIS — M25511 Pain in right shoulder: Secondary | ICD-10-CM

## 2024-01-19 DIAGNOSIS — M6281 Muscle weakness (generalized): Secondary | ICD-10-CM | POA: Diagnosis not present

## 2024-01-19 DIAGNOSIS — M25611 Stiffness of right shoulder, not elsewhere classified: Secondary | ICD-10-CM

## 2024-01-19 DIAGNOSIS — G8929 Other chronic pain: Secondary | ICD-10-CM

## 2024-01-19 NOTE — Therapy (Signed)
 OUTPATIENT PHYSICAL THERAPY TREATMENT   Patient Name: [name] MRN: [id number] DOB:[birth year], 53 y.o., female Today's Date: 01/19/2024  END OF SESSION:  PT End of Session - 01/19/24 0823     Visit Number 3    Number of Visits 16    Authorization Type [employer] [facility] $25 copay    Authorization Time Period 25 PT/OT    PT Start Time 0807    PT Stop Time 0900    PT Time Calculation (min) 53 min    Activity Tolerance Patient tolerated treatment well    Behavior During Therapy [facility] for tasks assessed/performed            Past Medical History:  Diagnosis Date   Allergy    Anemia    Anxiety    claustrophobic   Asthma    Back pain    Biceps tendonosis of right shoulder    Cataract    Mixed OU   COVID-19    covid PNA hospitalized 2020, 06/06/21   Diabetes mellitus 2011   did not start metforfin, losing weight   Difficult intubation    tooth got chipped one time   Dyspnea    Essential hypertension    Fatty liver    Food allergy    Frozen shoulder    left, had steroid injection 10/05/21   Headache disorder    History of concussion    HLD (hyperlipidemia)    Hypertensive retinopathy    OU   IBS (irritable bowel syndrome)    Infertility, female    Joint pain    Lactose intolerance    Leg edema    Migraines    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   Palpitation    PONV (postoperative nausea and vomiting)    Post-menopausal    Seborrheic dermatitis    Shoulder impingement syndrome, right    Type 2 diabetes mellitus with complication, with long-term current use of insulin  (HCC) 2018   Uveitis    [facility] est in 2020 seen specialist in [place] [facility] last seen 11 or 05/2020   Vitamin D  deficiency    Past Surgical History:  Procedure Laterality Date   ABDOMINAL HYSTERECTOMY  2006   heavy menses, endometriosis, l oophrectomy   BREAST BIOPSY Right 2018   benign   BREAST EXCISIONAL BIOPSY Right 2003   benign   BREAST EXCISIONAL BIOPSY Right  1999   benign   COLONOSCOPY  10/06/2020   INGUINAL HERNIA REPAIR Left 2003   LEFT OOPHORECTOMY  2006   LUMBAR FUSION  2005   L5-S1   POSTERIOR LUMBAR FUSION 2 WITH HARDWARE REMOVAL Right 12/26/2023   Procedure: ARTHROSCOPY, SHOULDER WITH DEBRIDEMENT BICEPS TENODESIS;  Surgeon: Genelle Standing, MD;  Location: [facility];  Service: Orthopedics;  Laterality: Right;  RIGHT SHOULDER ARTHROSCOPY WITH LYSIS OF ADHESIONS / MANIPULATION UNDER ANESTHESIA   SHOULDER ARTHROSCOPY WITH ROTATOR CUFF REPAIR AND SUBACROMIAL DECOMPRESSION  04/06/2023   Procedure: SHOULDER ARTHROSCOPY WITH ROTATOR CUFF REPAIR , EXTENSIVE DEBRIDEMENT, AND SUBACROMIAL DECOMPRESSION;  Surgeon: Genelle Standing, MD;  Location: [facility];  Service: Orthopedics;;   SHOULDER ARTHROSCOPY WITH SUBACROMIAL DECOMPRESSION AND OPEN ROTATOR C Right 07/14/2016   Procedure: RIGHT SHOULDER ARTHROSCOPY WITH SUBACROMIAL DECOMPRESSION, DISTAL CLAVICLE RESECTION AND MINI OPEN ROTATOR CUFF REPAIR, OPEN BICEP TENDODESIS;  Surgeon: Maude LELON Right, MD;  Location:  SURGERY CENTER;  Service: Orthopedics;  Laterality: Right;   SHOULDER CLOSED REDUCTION Right 09/08/2016   Procedure: RIGHT CLOSED MANIPULATION SHOULDER;  Surgeon: Maude LELON Right, MD;  Location: [facility];  Service: Orthopedics;  Laterality: Right;   SHOULDER CLOSED REDUCTION Right 12/26/2023   Procedure: MANIPULATION, JOINT, SHOULDER, WITH ANESTHESIA;  Surgeon: Genelle Standing, MD;  Location: [facility];  Service: Orthopedics;  Laterality: Right;   TUBAL LIGATION     UPPER GASTROINTESTINAL ENDOSCOPY  10/06/2020   Patient Active Problem List   Diagnosis Date Noted   Biceps tendinitis of right upper extremity 12/26/2023   Adhesive capsulitis of right shoulder 12/26/2023   Traumatic incomplete tear of right rotator cuff 04/06/2023   Vertigo 07/03/2022   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 09/21/2021    Chronic left shoulder pain 09/21/2021   Fatty infiltration of liver 07/26/2020   Diarrhea, functional 06/22/2020   Uveitis, intermediate, bilateral 09/07/2019   Generalized edema 06/13/2019   Hospital discharge follow-up 06/07/2019   Multinodular goiter 12/05/2017   Microalbuminuria due to type 2 diabetes mellitus (HCC) 11/26/2017   Encounter for preventive health examination 11/26/2017   Menopause syndrome 06/10/2017   Adverse reaction to vaccine, sequela 03/25/2017   Atypical chest pain 02/27/2017   PSVT (paroxysmal supraventricular tachycardia) (HCC) 11/26/2016   Nontraumatic incomplete tear of right rotator cuff 07/14/2016   AC (acromioclavicular) arthritis 07/14/2016   Impingement syndrome of right shoulder 07/14/2016   Fibrocystic breast changes, right 05/21/2016   Insomnia 10/16/2015   Snoring 07/26/2015   Chronic migraine w/o aura w/o status migrainosus, not intractable 02/24/2015   History of concussion 07/21/2014   Hyperlipidemia associated with type 2 diabetes mellitus (HCC) 06/13/2014   Vitamin D  deficiency 05/07/2013   Chronic migraine 11/20/2012   S/P Total Abdominal Hysterectomy and Left Salpingo-oophorectomy 10/20/2011   Headache around the eyes    Essential hypertension 07/20/2011    PCP: Marylynn Verneita CROME, MD  REFERRING PROVIDER: Genelle Standing, MD  REFERRING DIAG: S46.011A (ICD-10-CM) - Traumatic incomplete tear of right rotator cuff, initial encounter  Rationale for Evaluation and Treatment: Rehabilitation  THERAPY DIAG:  Muscle weakness (generalized)  Chronic right shoulder pain  Stiffness of right shoulder, not elsewhere classified  ONSET DATE: DOS: 12/26/23   SUBJECTIVE:                                                                                                                                                                                           SUBJECTIVE STATEMENT: Pt indicated having complaints of Rt forearm/hand numbness/tingling  continued.   Reported Rt shoulder pain up to 6-7/10.   PERTINENT HISTORY:  Rt RTC repair, type 2 DM, HTN  PAIN:  NPRS scale: up to 6-7/10.  Pain location:  Rt shoulder Pain description: throbbing, tightness, tingling, sticking: Aggravating factors: activity in general Relieving factors: pain medications  PRECAUTIONS:  Shoulder - biceps tenodesis  RED FLAGS: None   WEIGHT BEARING RESTRICTIONS:  No  FALLS:  Has patient fallen in last 6 months? No  LIVING ENVIRONMENT: Lives with: lives with their daughter (home from college, moves back to college 8/21)  OCCUPATION:  Arts development officer  PLOF:  Independent and Leisure: go to the gun range  PATIENT GOALS:  Regain function of Rt shoulder/arm   OBJECTIVE:  Note: Objective measures were completed at Evaluation unless otherwise noted.   PATIENT SURVEYS:  Patient-Specific Activity Scoring Scheme  0 represents "unable to perform." 10 represents "able to perform at prior level. 0 1 2 3 4 5 6 7 8 9  10 (Date and Score)   Activity Eval     1. Shower 3    2. Hair Care 3    3. IADLs 3   Score 3    Total score = sum of the activity scores/number of activities Minimum detectable change (90%CI) for average score = 2 points Minimum detectable change (90%CI) for single activity score = 3 points    COGNITIVE STATUS: Within functional limits for tasks assessed   SENSATION: WFL  POSTURE:  rounded shoulders and forward head  HAND DOMINANCE:  Right  GAIT: 12/28/23 Comments: independent   EDEMA: 12/28/23 Dressing still in place; swelling noted in Rt shoulder   UPPER EXTREMITY ROM:  ROM Right eval Right 01/19/2024 In supine   Shoulder flexion P: 112 (Unable to perform A) AROM: 122   Shoulder extension    Shoulder abduction P: 74 AROM 95  Shoulder adduction    Shoulder extension    Shoulder internal rotation P: 45 A: 87 in 45 deg abduction  Shoulder external rotation P: 38 A: 40   (Blank  rows = not tested)   UPPER EXTREMITY MMT:  12/28/23: not formally tested due to post op status; difficult with AROM at this time  MMT Right eval Left eval  Shoulder flexion    Shoulder extension    Shoulder abduction    Shoulder adduction    Shoulder extension    Shoulder internal rotation    Shoulder external rotation    Middle trapezius    Lower trapezius    Elbow flexion    Elbow extension    Wrist flexion    Wrist extension    Wrist ulnar deviation    Wrist radial deviation    Wrist pronation    Wrist supination    Grip strength     (Blank rows = not tested)                     TREATMENT       DATE: 01/19/2024 Therex: Supine wand AAROM chest press x 10  Standing pendulum circles 2 mins mid treatment for symptom relief.  Review of existing HEP and updates to program. Updated handout provided.   Neuro Re-ed (muscle activation, scapular control) Supine wand AAROM chest press with SA press 3 sec hold x 15 Supine Rt shoulder in 90 deg flexion with mild resistance from clinician 15-20 sec bouts for neuromuscular control  Standing at doorway isometric painfree 5 sec on/off holds with arm at side with towel under arm - performed 12 in flexion, abduction, ER Rt shoulder.    TherActivity(to improve functional reach, arm lift for daily task) Supine Rt shoulder AROM to tolerance x 10 (instruction for  home use)   Manual: Supine Rt shoulder gh joint g2-g3 inferior, distraction with PROM.  Sustained mild posterior glide with passive ER to tolerance.      TREATMENT       DATE: 01/09/24 There Ex Pendulums all directions with rest breaks Shoulder press with dowel 3x10 Shoulder flexion with dowel 2x10 Shoulder ER with dowel 2x10 Seated scapular shrugs Manual  Scap mobs and PROM    TREATMENT       DATE:12/28/23 TherEx See HEP - demonstrated with trial reps performed PRN, mod cues for comprehension    Self Care Educated on clinical findings and PT POC - pt verbalized  understanding     PATIENT EDUCATION:  Education details: HEP, Person educated: Patient Education method: Explanation, Demonstration, and Handouts Education comprehension: verbalized understanding, returned demonstration, and needs further education  HOME EXERCISE PROGRAM: Access Code: [email or web address] URL: [email or web address] Date: 12/28/2023 Prepared by: Corean Ku  Exercises - Flexion-Extension Shoulder Pendulum with Table Support  - 3-4 x daily - 7 x weekly - 1 sets - 10 reps - Horizontal Shoulder Pendulum with Table Support  - 3-4 x daily - 7 x weekly - 1 sets - 10 reps - Circular Shoulder Pendulum with Table Support  - 3-4 x daily - 7 x weekly - 1 sets - 10 reps - Supine Shoulder Press with Dowel  - 3-4 x daily - 7 x weekly - 1 sets - 10 reps - Supine Shoulder Flexion with Dowel  - 3-4 x daily - 7 x weekly - 1 sets - 10 reps - Supine Shoulder External Rotation in 45 Degrees Abduction AAROM with Dowel  - 3-4 x daily - 7 x weekly - 1 sets - 10 reps - Standing Shoulder Abduction AAROM with Dowel  - 3-4 x daily - 7 x weekly - 1 sets - 10 reps   ASSESSMENT:  CLINICAL IMPRESSION: Presentation today of muscle guarding, pain as most obvious barrier to range of motion progression.  Capsular movement in inferior assessment was normal, mild restriction in PA direction.   Muscle fatigue noted in elevation attempts.  Discussed adjustments of intervention to accommodate any pain response.  Continued skilled PT services indicated.   OBJECTIVE IMPAIRMENTS: decreased ROM, decreased strength, increased edema, increased muscle spasms, impaired flexibility, impaired UE functional use, postural dysfunction, and pain.   ACTIVITY LIMITATIONS: carrying, lifting, sleeping, bed mobility, bathing, toileting, dressing, self feeding, reach over head, and hygiene/grooming  PARTICIPATION LIMITATIONS: meal prep, cleaning, laundry, driving, shopping, community activity, and  occupation  PERSONAL FACTORS: Behavior pattern, Past/current experiences, Time since onset of injury/illness/exacerbation, and 3+ comorbidities: Rt RTC repair, type 2 DM, HTN are also affecting patient's functional outcome.   REHAB POTENTIAL: Good  CLINICAL DECISION MAKING: Evolving/moderate complexity  EVALUATION COMPLEXITY: Moderate   GOALS: Goals reviewed with patient? Yes  SHORT TERM GOALS: Target date: 01/25/2024  Independent with initial HEP Goal status:  on going 01/19/2024  2.  Rt shoulder PROM improved by 15 deg al directions for improved mobility Goal status:  on going 01/19/2024  LONG TERM GOALS: Target date: 02/22/2024   Independent with final HEP Goal status:  on going 01/19/2024  2.  PSFS score improved by 3 points Goal status:  on going 01/19/2024  3.  Rt shoulder AROM improved to [facility] for improved function and mobility Goal status:  on going 01/19/2024  4.  Report pain < 3/10 with ADLs for improved function Goal status:  on going 01/19/2024  5.  Demonstrate at least 4/5 Rt  shoulder strength for improved function Goal status:  on going 01/19/2024    PLAN:  PT FREQUENCY: 1-2x/week  PT DURATION: 8 weeks  PLANNED INTERVENTIONS: 97164- PT Re-evaluation, 97750- Physical Performance Testing, 97110-Therapeutic exercises, 97530- Therapeutic activity, 97112- Neuromuscular re-education, 97535- Self Care, [id number]- Manual therapy, [phone]- Aquatic Therapy, [id number]- Electrical stimulation (unattended), 97016- Vasopneumatic device, 20560 (1-2 muscles), 20561 (3+ muscles)- Dry Needling, Patient/Family education, Taping, Joint mobilization, Joint manipulation, Scar mobilization, Cryotherapy, and Moist heat.  PLAN FOR NEXT SESSION:  AAROM, AROM transitioning, gravity reduced.    Ozell Silvan, PT, DPT, OCS, ATC 01/19/24  9:05 AM

## 2024-01-24 ENCOUNTER — Other Ambulatory Visit (HOSPITAL_COMMUNITY): Payer: Self-pay

## 2024-01-24 ENCOUNTER — Other Ambulatory Visit: Payer: Self-pay

## 2024-01-26 ENCOUNTER — Encounter: Admitting: Physical Therapy

## 2024-01-29 ENCOUNTER — Encounter: Payer: Self-pay | Admitting: Physical Therapy

## 2024-01-29 ENCOUNTER — Ambulatory Visit (INDEPENDENT_AMBULATORY_CARE_PROVIDER_SITE_OTHER): Admitting: Physical Therapy

## 2024-01-29 DIAGNOSIS — M25511 Pain in right shoulder: Secondary | ICD-10-CM | POA: Diagnosis not present

## 2024-01-29 DIAGNOSIS — M6281 Muscle weakness (generalized): Secondary | ICD-10-CM

## 2024-01-29 DIAGNOSIS — M25611 Stiffness of right shoulder, not elsewhere classified: Secondary | ICD-10-CM

## 2024-01-29 DIAGNOSIS — G8929 Other chronic pain: Secondary | ICD-10-CM

## 2024-01-29 NOTE — Therapy (Signed)
 OUTPATIENT PHYSICAL THERAPY TREATMENT   Patient Name: [name] MRN: [id number] DOB:04/11/1971, 53 y.o., female Today's Date: 01/29/2024  END OF SESSION:  PT End of Session - 01/29/24 0914     Visit Number 4    Number of Visits 16    Date for PT Re-Evaluation 02/22/24    Authorization Type [employer] [facility] $25 copay    Authorization Time Period 25 PT/OT    PT Start Time [phone]   pt arrived late   PT Stop Time 0928    PT Time Calculation (min) 33 min    Activity Tolerance Patient tolerated treatment well    Behavior During Therapy [facility] for tasks assessed/performed             Past Medical History:  Diagnosis Date   Allergy    Anemia    Anxiety    claustrophobic   Asthma    Back pain    Biceps tendonosis of right shoulder    Cataract    Mixed OU   COVID-19    covid PNA hospitalized 2020, 06/06/21   Diabetes mellitus 2011   did not start metforfin, losing weight   Difficult intubation    tooth got chipped one time   Dyspnea    Essential hypertension    Fatty liver    Food allergy    Frozen shoulder    left, had steroid injection 10/05/21   Headache disorder    History of concussion    HLD (hyperlipidemia)    Hypertensive retinopathy    OU   IBS (irritable bowel syndrome)    Infertility, female    Joint pain    Lactose intolerance    Leg edema    Migraines    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   Palpitation    PONV (postoperative nausea and vomiting)    Post-menopausal    Seborrheic dermatitis    Shoulder impingement syndrome, right    Type 2 diabetes mellitus with complication, with long-term current use of insulin  (HCC) 2018   Uveitis    [place] [facility] in 2020 seen specialist in [place] [facility] last seen 11 or 05/2020   Vitamin D  deficiency    Past Surgical History:  Procedure Laterality Date   ABDOMINAL HYSTERECTOMY  2006   heavy menses, endometriosis, l oophrectomy   BREAST BIOPSY Right 2018   benign   BREAST EXCISIONAL  BIOPSY Right 2003   benign   BREAST EXCISIONAL BIOPSY Right 1999   benign   COLONOSCOPY  10/06/2020   INGUINAL HERNIA REPAIR Left 2003   LEFT OOPHORECTOMY  2006   LUMBAR FUSION  2005   L5-S1   POSTERIOR LUMBAR FUSION 2 WITH HARDWARE REMOVAL Right 12/26/2023   Procedure: ARTHROSCOPY, SHOULDER WITH DEBRIDEMENT BICEPS TENODESIS;  Surgeon: Genelle Standing, MD;  Location: [facility];  Service: Orthopedics;  Laterality: Right;  RIGHT SHOULDER ARTHROSCOPY WITH LYSIS OF ADHESIONS / MANIPULATION UNDER ANESTHESIA   SHOULDER ARTHROSCOPY WITH ROTATOR CUFF REPAIR AND SUBACROMIAL DECOMPRESSION  04/06/2023   Procedure: SHOULDER ARTHROSCOPY WITH ROTATOR CUFF REPAIR , EXTENSIVE DEBRIDEMENT, AND SUBACROMIAL DECOMPRESSION;  Surgeon: Genelle Standing, MD;  Location: [facility];  Service: Orthopedics;;   SHOULDER ARTHROSCOPY WITH SUBACROMIAL DECOMPRESSION AND OPEN ROTATOR C Right 07/14/2016   Procedure: RIGHT SHOULDER ARTHROSCOPY WITH SUBACROMIAL DECOMPRESSION, DISTAL CLAVICLE RESECTION AND MINI OPEN ROTATOR CUFF REPAIR, OPEN BICEP TENDODESIS;  Surgeon: Maude LELON Right, MD;  Location: [facility];  Service: Orthopedics;  Laterality: Right;  SHOULDER CLOSED REDUCTION Right 09/08/2016   Procedure: RIGHT CLOSED MANIPULATION SHOULDER;  Surgeon: Maude LELON Right, MD;  Location: [facility];  Service: Orthopedics;  Laterality: Right;   SHOULDER CLOSED REDUCTION Right 12/26/2023   Procedure: MANIPULATION, JOINT, SHOULDER, WITH ANESTHESIA;  Surgeon: Genelle Standing, MD;  Location: [facility];  Service: Orthopedics;  Laterality: Right;   TUBAL LIGATION     UPPER GASTROINTESTINAL ENDOSCOPY  10/06/2020   Patient Active Problem List   Diagnosis Date Noted   Biceps tendinitis of right upper extremity 12/26/2023   Adhesive capsulitis of right shoulder 12/26/2023   Traumatic incomplete tear of right rotator cuff 04/06/2023   Vertigo 07/03/2022   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 09/21/2021   Chronic left shoulder pain 09/21/2021   Fatty infiltration of liver 07/26/2020   Diarrhea, functional 06/22/2020   Uveitis, intermediate, bilateral 09/07/2019   Generalized edema 06/13/2019   Hospital discharge follow-up 06/07/2019   Multinodular goiter 12/05/2017   Microalbuminuria due to type 2 diabetes mellitus (HCC) 11/26/2017   Encounter for preventive health examination 11/26/2017   Menopause syndrome 06/10/2017   Adverse reaction to vaccine, sequela 03/25/2017   Atypical chest pain 02/27/2017   PSVT (paroxysmal supraventricular tachycardia) (HCC) 11/26/2016   Nontraumatic incomplete tear of right rotator cuff 07/14/2016   AC (acromioclavicular) arthritis 07/14/2016   Impingement syndrome of right shoulder 07/14/2016   Fibrocystic breast changes, right 05/21/2016   Insomnia 10/16/2015   Snoring 07/26/2015   Chronic migraine w/o aura w/o status migrainosus, not intractable 02/24/2015   History of concussion 07/21/2014   Hyperlipidemia associated with type 2 diabetes mellitus (HCC) 06/13/2014   Vitamin D  deficiency 05/07/2013   Chronic migraine 11/20/2012   S/P Total Abdominal Hysterectomy and Left Salpingo-oophorectomy 10/20/2011   Headache around the eyes    Essential hypertension 07/20/2011    PCP: Marylynn Verneita CROME, MD  REFERRING PROVIDER: Genelle Standing, MD  REFERRING DIAG: [phone] (ICD-10-CM) - Traumatic incomplete tear of right rotator cuff, initial encounter  Rationale for Evaluation and Treatment: Rehabilitation  THERAPY DIAG:  Muscle weakness (generalized)  Chronic right shoulder pain  Stiffness of right shoulder, not elsewhere classified  ONSET DATE: DOS: 12/26/23   SUBJECTIVE:                                                                                                                                                                                           SUBJECTIVE STATEMENT: Doing pretty  well, shoulder is okay.  Hand is still having a lot of nerve pain.   PERTINENT HISTORY:  Rt RTC repair, type 2 DM, HTN  PAIN:  NPRS scale: up to 6-7/10.  Pain location: Rt shoulder Pain description: throbbing, tightness, tingling, sticking: Aggravating factors: activity in general Relieving factors: pain medications  PRECAUTIONS:  Shoulder - biceps tenodesis  RED FLAGS: None   WEIGHT BEARING RESTRICTIONS:  No  FALLS:  Has patient fallen in last 6 months? No  LIVING ENVIRONMENT: Lives with: lives with their daughter (home from college, moves back to college 8/21)  OCCUPATION:  Arts development officer  PLOF:  Independent and Leisure: go to the gun range  PATIENT GOALS:  Regain function of Rt shoulder/arm   OBJECTIVE:  Note: Objective measures were completed at Evaluation unless otherwise noted.   PATIENT SURVEYS:  Patient-Specific Activity Scoring Scheme  0 represents "unable to perform." 10 represents "able to perform at prior level. 0 1 2 3 4 5 6 7 8 9  10 (Date and Score)   Activity Eval     1. Shower 3    2. Hair Care 3    3. IADLs 3   Score 3    Total score = sum of the activity scores/number of activities Minimum detectable change (90%CI) for average score = 2 points Minimum detectable change (90%CI) for single activity score = 3 points    COGNITIVE STATUS: Within functional limits for tasks assessed   SENSATION: WFL  POSTURE:  rounded shoulders and forward head  HAND DOMINANCE:  Right  GAIT: 12/28/23 Comments: independent   EDEMA: 12/28/23 Dressing still in place; swelling noted in Rt shoulder   UPPER EXTREMITY ROM:  ROM Right eval Right 01/19/2024 In supine   Shoulder flexion P: 112 (Unable to perform A) AROM: 122   Shoulder extension    Shoulder abduction P: 74 AROM 95  Shoulder adduction    Shoulder extension    Shoulder internal rotation P: 45 A: 87 in 45 deg abduction  Shoulder external rotation P: 38 A: 40    (Blank rows = not tested)   UPPER EXTREMITY MMT:  12/28/23: not formally tested due to post op status; difficult with AROM at this time  MMT Right eval Left eval  Shoulder flexion    Shoulder extension    Shoulder abduction    Shoulder adduction    Shoulder extension    Shoulder internal rotation    Shoulder external rotation    Middle trapezius    Lower trapezius    Elbow flexion    Elbow extension    Wrist flexion    Wrist extension    Wrist ulnar deviation    Wrist radial deviation    Wrist pronation    Wrist supination    Grip strength     (Blank rows = not tested)                     TREATMENT 01/29/24 TherAct AA chest press with overhead reach 2# bar 2x10; 3 sec hold at end range Supine active (min A needed from LUE) shoulder flexion 1# x10, then no weight x 10 Sidelying shoulder abduction 2x10; 3 sec hold at end range Sidelying ER 1# weight 2x10 Rows L3 band 2x10; 5 sec hold Shoulder ext 2x10; L3 band Reactive isometrics IR/ER 2x10; L3 band    01/19/2024 Therex: Supine wand AAROM chest press x 10  Standing pendulum circles 2 mins mid treatment for symptom relief.  Review of existing HEP and updates to program. Updated handout provided.   Neuro Re-ed (muscle activation, scapular control) Supine wand AAROM chest press with SA press  3 sec hold x 15 Supine Rt shoulder in 90 deg flexion with mild resistance from clinician 15-20 sec bouts for neuromuscular control  Standing at doorway isometric painfree 5 sec on/off holds with arm at side with towel under arm - performed 12 in flexion, abduction, ER Rt shoulder.    TherActivity(to improve functional reach, arm lift for daily task) Supine Rt shoulder AROM to tolerance x 10 (instruction for home use)   Manual: Supine Rt shoulder gh joint g2-g3 inferior, distraction with PROM.  Sustained mild posterior glide with passive ER to tolerance.      01/09/24 There Ex Pendulums all directions with rest  breaks Shoulder press with dowel 3x10 Shoulder flexion with dowel 2x10 Shoulder ER with dowel 2x10 Seated scapular shrugs Manual  Scap mobs and PROM   12/28/23 TherEx See HEP - demonstrated with trial reps performed PRN, mod cues for comprehension    Self Care Educated on clinical findings and PT POC - pt verbalized understanding     PATIENT EDUCATION:  Education details: HEP, Person educated: Patient Education method: Explanation, Demonstration, and Handouts Education comprehension: verbalized understanding, returned demonstration, and needs further education  HOME EXERCISE PROGRAM: Access Code: [email or web address] URL: [email or web address] Date: 12/28/2023 Prepared by: Corean Ku  Exercises - Flexion-Extension Shoulder Pendulum with Table Support  - 3-4 x daily - 7 x weekly - 1 sets - 10 reps - Horizontal Shoulder Pendulum with Table Support  - 3-4 x daily - 7 x weekly - 1 sets - 10 reps - Circular Shoulder Pendulum with Table Support  - 3-4 x daily - 7 x weekly - 1 sets - 10 reps - Supine Shoulder Press with Dowel  - 3-4 x daily - 7 x weekly - 1 sets - 10 reps - Supine Shoulder Flexion with Dowel  - 3-4 x daily - 7 x weekly - 1 sets - 10 reps - Supine Shoulder External Rotation in 45 Degrees Abduction AAROM with Dowel  - 3-4 x daily - 7 x weekly - 1 sets - 10 reps - Standing Shoulder Abduction AAROM with Dowel  - 3-4 x daily - 7 x weekly - 1 sets - 10 reps   ASSESSMENT:  CLINICAL IMPRESSION: Pt tolerated session well today and able to tolerate light strength progressions without significant increase in pain.  Will continue to benefit from PT to maximize function.   OBJECTIVE IMPAIRMENTS: decreased ROM, decreased strength, increased edema, increased muscle spasms, impaired flexibility, impaired UE functional use, postural dysfunction, and pain.   ACTIVITY LIMITATIONS: carrying, lifting, sleeping, bed mobility, bathing, toileting, dressing, self feeding,  reach over head, and hygiene/grooming  PARTICIPATION LIMITATIONS: meal prep, cleaning, laundry, driving, shopping, community activity, and occupation  PERSONAL FACTORS: Behavior pattern, Past/current experiences, Time since onset of injury/illness/exacerbation, and 3+ comorbidities: Rt RTC repair, type 2 DM, HTN are also affecting patient's functional outcome.   REHAB POTENTIAL: Good  CLINICAL DECISION MAKING: Evolving/moderate complexity  EVALUATION COMPLEXITY: Moderate   GOALS: Goals reviewed with patient? Yes  SHORT TERM GOALS: Target date: 01/25/2024  Independent with initial HEP Goal status:  on going 01/19/2024  2.  Rt shoulder PROM improved by 15 deg al directions for improved mobility Goal status:  on going 01/19/2024  LONG TERM GOALS: Target date: 02/22/2024  Independent with final HEP Goal status:  on going 01/19/2024  2.  PSFS score improved by 3 points Goal status:  on going 01/19/2024  3.  Rt shoulder AROM improved to [facility] for improved function and mobility Goal status:  on going 01/19/2024  4.  Report pain < 3/10 with ADLs for improved function Goal status:  on going 01/19/2024  5.  Demonstrate at least 4/5 Rt shoulder strength for improved function Goal status:  on going 01/19/2024    PLAN:  PT FREQUENCY: 1-2x/week  PT DURATION: 8 weeks  PLANNED INTERVENTIONS: 97164- PT Re-evaluation, 97750- Physical Performance Testing, 97110-Therapeutic exercises, 97530- Therapeutic activity, 97112- Neuromuscular re-education, 97535- Self Care, 02859- Manual therapy, [phone]- Aquatic Therapy, G0283- Electrical stimulation (unattended), 97016- Vasopneumatic device, 20560 (1-2 muscles), 20561 (3+ muscles)- Dry Needling, Patient/Family education, Taping, Joint mobilization, Joint manipulation, Scar mobilization, Cryotherapy, and Moist heat.  PLAN FOR NEXT SESSION:  measure PROM,  AAROM, AROM transitioning, gravity reduced.    Corean JULIANNA Ku, PT, DPT 01/29/24 11:13  AM

## 2024-02-06 ENCOUNTER — Ambulatory Visit: Admitting: Physical Medicine and Rehabilitation

## 2024-02-07 ENCOUNTER — Other Ambulatory Visit: Payer: Self-pay

## 2024-02-07 ENCOUNTER — Other Ambulatory Visit (HOSPITAL_BASED_OUTPATIENT_CLINIC_OR_DEPARTMENT_OTHER): Payer: Self-pay | Admitting: Orthopaedic Surgery

## 2024-02-07 ENCOUNTER — Other Ambulatory Visit (HOSPITAL_COMMUNITY): Payer: Self-pay

## 2024-02-07 ENCOUNTER — Encounter (HOSPITAL_BASED_OUTPATIENT_CLINIC_OR_DEPARTMENT_OTHER): Payer: Self-pay | Admitting: Orthopaedic Surgery

## 2024-02-07 MED ORDER — MELOXICAM 15 MG PO TABS
15.0000 mg | ORAL_TABLET | Freq: Every day | ORAL | 0 refills | Status: DC
  Filled 2024-02-07: qty 14, 14d supply, fill #0

## 2024-02-08 ENCOUNTER — Other Ambulatory Visit (HOSPITAL_COMMUNITY): Payer: Self-pay

## 2024-02-08 ENCOUNTER — Other Ambulatory Visit: Payer: Self-pay | Admitting: Internal Medicine

## 2024-02-09 ENCOUNTER — Other Ambulatory Visit: Payer: Self-pay

## 2024-02-09 ENCOUNTER — Other Ambulatory Visit (HOSPITAL_COMMUNITY): Payer: Self-pay

## 2024-02-09 ENCOUNTER — Telehealth: Payer: Self-pay | Admitting: Pharmacist

## 2024-02-09 MED ORDER — DEXCOM G7 SENSOR MISC
3.0000 | 4 refills | Status: DC
  Filled 2024-02-09: qty 3, 30d supply, fill #0
  Filled 2024-02-19: qty 9, 90d supply, fill #0
  Filled 2024-02-19: qty 3, 30d supply, fill #0

## 2024-02-12 ENCOUNTER — Other Ambulatory Visit: Payer: Self-pay

## 2024-02-12 ENCOUNTER — Other Ambulatory Visit (HOSPITAL_COMMUNITY): Payer: Self-pay

## 2024-02-12 MED ORDER — ONDANSETRON 4 MG PO TBDP
4.0000 mg | ORAL_TABLET | Freq: Three times a day (TID) | ORAL | 0 refills | Status: DC | PRN
  Filled 2024-02-12: qty 20, 7d supply, fill #0

## 2024-02-12 MED ORDER — DICYCLOMINE HCL 20 MG PO TABS
20.0000 mg | ORAL_TABLET | Freq: Four times a day (QID) | ORAL | 1 refills | Status: DC
Start: 2024-02-12 — End: 2024-02-27
  Filled 2024-02-12: qty 90, 23d supply, fill #0

## 2024-02-13 ENCOUNTER — Other Ambulatory Visit (HOSPITAL_COMMUNITY): Payer: Self-pay

## 2024-02-13 ENCOUNTER — Other Ambulatory Visit: Payer: Self-pay

## 2024-02-19 ENCOUNTER — Other Ambulatory Visit: Payer: Self-pay

## 2024-02-19 ENCOUNTER — Other Ambulatory Visit (HOSPITAL_COMMUNITY): Payer: Self-pay

## 2024-02-19 ENCOUNTER — Telehealth: Payer: Self-pay

## 2024-02-19 MED ORDER — DEXCOM G7 SENSOR MISC: Status: DC

## 2024-02-19 NOTE — Telephone Encounter (Signed)
 Pharmacy Patient Advocate Encounter   Received notification from CoverMyMeds that prior authorization for Ozempic  (0.25 or 0.5 MG/DOSE) 2MG /3ML pen-injectors is required/requested.   Insurance verification completed.   The patient is insured through [facility] .   Per test claim: The current 30 day co-pay is, $24.99.  No PA needed at this time. This test claim was processed through [facility]- copay amounts may vary at other pharmacies due to pharmacy/plan contracts, or as the patient moves through the different stages of their insurance plan.

## 2024-02-19 NOTE — Telephone Encounter (Signed)
Pt needs a PA for Dexcom G7

## 2024-02-19 NOTE — Telephone Encounter (Signed)
 Pharmacy Patient Advocate Encounter   Received notification from Pt Calls Messages that prior authorization for Dexcom G7 sensor is required/requested.   Insurance verification completed.   The patient is insured through [facility] .   Per test claim: Must Use Insulin . PA will be denied if pt is not using insulin 

## 2024-02-20 ENCOUNTER — Other Ambulatory Visit: Payer: Self-pay

## 2024-02-20 ENCOUNTER — Other Ambulatory Visit (HOSPITAL_COMMUNITY): Payer: Self-pay

## 2024-02-20 MED ORDER — FREESTYLE LANCETS MISC
12 refills | Status: AC
  Filled 2024-02-20: qty 300, 90d supply, fill #0

## 2024-02-20 MED ORDER — ACCU-CHEK GUIDE TEST VI STRP
ORAL_STRIP | 12 refills | Status: AC
  Filled 2024-02-20: qty 300, 90d supply, fill #0

## 2024-02-20 MED ORDER — ACCU-CHEK GUIDE W/DEVICE KIT
PACK | 0 refills | Status: AC
  Filled 2024-02-20: qty 1, 1d supply, fill #0

## 2024-02-21 ENCOUNTER — Other Ambulatory Visit (HOSPITAL_COMMUNITY): Payer: Self-pay

## 2024-02-22 ENCOUNTER — Ambulatory Visit: Admitting: Internal Medicine

## 2024-02-23 ENCOUNTER — Other Ambulatory Visit: Payer: Self-pay

## 2024-02-26 ENCOUNTER — Other Ambulatory Visit: Payer: Self-pay

## 2024-02-27 ENCOUNTER — Ambulatory Visit: Admitting: Internal Medicine

## 2024-02-27 ENCOUNTER — Other Ambulatory Visit (HOSPITAL_COMMUNITY): Payer: Self-pay

## 2024-02-27 ENCOUNTER — Encounter: Payer: Self-pay | Admitting: Internal Medicine

## 2024-02-27 ENCOUNTER — Ambulatory Visit (INDEPENDENT_AMBULATORY_CARE_PROVIDER_SITE_OTHER)

## 2024-02-27 VITALS — BP 136/84 | HR 84 | Ht 66.0 in | Wt 199.2 lb

## 2024-02-27 DIAGNOSIS — K591 Functional diarrhea: Secondary | ICD-10-CM | POA: Diagnosis not present

## 2024-02-27 DIAGNOSIS — K5901 Slow transit constipation: Secondary | ICD-10-CM

## 2024-02-27 DIAGNOSIS — K581 Irritable bowel syndrome with constipation: Secondary | ICD-10-CM

## 2024-02-27 DIAGNOSIS — R110 Nausea: Secondary | ICD-10-CM | POA: Diagnosis not present

## 2024-02-27 MED ORDER — DICYCLOMINE HCL 20 MG PO TABS
20.0000 mg | ORAL_TABLET | Freq: Four times a day (QID) | ORAL | 1 refills | Status: AC
  Filled 2024-02-27 – 2024-03-01 (×2): qty 120, 30d supply, fill #0

## 2024-02-27 MED ORDER — LOSARTAN POTASSIUM 100 MG PO TABS
100.0000 mg | ORAL_TABLET | Freq: Every day | ORAL | 1 refills | Status: AC
  Filled 2024-02-27: qty 90, 90d supply, fill #0

## 2024-02-27 MED ORDER — ATORVASTATIN CALCIUM 20 MG PO TABS
20.0000 mg | ORAL_TABLET | Freq: Every day | ORAL | 0 refills | Status: AC
  Filled 2024-02-27 – 2024-05-20 (×2): qty 90, 90d supply, fill #0

## 2024-02-27 NOTE — Patient Instructions (Addendum)
 Try adding   Magnesium  at bedtime for the constipation  250  mg max dose   IBGARD  (encapsulated peppermint) for the nausea,  bloating etc   Gi referral  in process

## 2024-02-27 NOTE — Progress Notes (Unsigned)
 Subjective:  Patient ID: [name], female    DOB: [birth year]  Age: 53 y.o. MRN: [id number]  CC: There were no encounter diagnoses.   HPI [name] presents for  Chief Complaint  Patient presents with   Medical Management of Chronic Issues    6 month follow up    IBS:  patient has been having abdominal cramping, moving bowels less often since Change in med from ozempic  to mounjaro  due to frequent vomiting .  Moves every 3 days,  no nausea today but usually  present upon waking,  no vomiting.     Outpatient Medications Prior to Visit  Medication Sig Dispense Refill   albuterol  (VENTOLIN  HFA) 108 (90 Base) MCG/ACT inhaler Inhale 2 puffs into the lungs every 6 (six) hours as needed for wheezing or shortness of breath. 18 g 0   Atogepant  (QULIPTA ) 60 MG TABS Take 1 tablet (60 mg total) by mouth daily. 30 tablet 11   Blood Glucose Monitoring Suppl (ACCU-CHEK GUIDE) w/Device KIT Use to check blood sugar 3 times a day. 1 kit 0   butalbital -acetaminophen -caffeine  (FIORICET ) 50-325-40 MG tablet Take 1 tablet by mouth every 6 (six) hours as needed for headache. Do not refill in less than 30 day 30 tablet 5   Lancets (FREESTYLE) lancets Use to check blood sugar 3 times a day. 300 each 12   atorvastatin  (LIPITOR) 20 MG tablet Take 1 tablet (20 mg total) by mouth daily. 90 tablet 0   Continuous Glucose Sensor (DEXCOM G7 SENSOR) MISC Apply 1 sensor every 10 days. 9 each 4   Continuous Glucose Sensor (DEXCOM G7 SENSOR) MISC Use to check glucose continuously, change sensor every 10 days     diazepam  (VALIUM ) 5 MG tablet Take 1 tablet (5 mg total) by mouth every 12 (twelve) hours as needed for anxiety. 30 tablet 5   dicyclomine  (BENTYL ) 20 MG tablet Take 1 tablet (20 mg total) by mouth every 6 (six) hours. 90 tablet 1   EPINEPHrine  0.3 mg/0.3 mL IJ SOAJ injection Inject 0.3 mg into the muscle as needed for anaphylaxis. 2 each 0   fluconazole  (DIFLUCAN ) 150 MG tablet Take 1 tablet (150 mg  total) by mouth daily. 5 tablet 0   gabapentin  (NEURONTIN ) 100 MG capsule Take 1 capsule (100 mg total) by mouth 3 (three) times daily. (Patient taking differently: Take 100 mg by mouth 3 (three) times daily. As needed) 30 capsule 1   glucose blood (ACCU-CHEK GUIDE TEST) test strip Use to check blood sugar 3 times a day. 300 each 12   Ipratropium-Albuterol  (COMBIVENT ) 20-100 MCG/ACT AERS respimat Inhale 1 puff into the lungs every 6 (six) hours. (Patient taking differently: Inhale 1 puff into the lungs every 6 (six) hours as needed for shortness of breath.) 4 g 11   losartan  (COZAAR ) 100 MG tablet Take 1 tablet (100 mg total) by mouth daily. 90 tablet 1   meloxicam  (MOBIC ) 15 MG tablet Take 1 tablet (15 mg total) by mouth daily. 14 tablet 0   metFORMIN  (GLUCOPHAGE -XR) 500 MG 24 hr tablet Take 1 tablet (500 mg total) by mouth daily. 90 tablet 3   metoprolol  succinate (TOPROL -XL) 25 MG 24 hr tablet Take 0.5 tablets (12.5 mg total) by mouth daily. 45 tablet 0   ondansetron  (ZOFRAN -ODT) 4 MG disintegrating tablet Take 1 tablet (4 mg total) by mouth every 8 (eight) hours as needed for nausea or vomiting. 20 tablet 0   ondansetron  (ZOFRAN -ODT) 4 MG disintegrating tablet Dissolve  1 tablet (4 mg total) by mouth every 8 (eight) hours as needed for nausea or vomiting. 20 tablet 0   oxyCODONE  (ROXICODONE ) 5 MG immediate release tablet Take 1 tablet (5 mg total) by mouth every 4 (four) hours as needed for severe pain (pain score 7-10) or breakthrough pain. 15 tablet 0   pantoprazole  (PROTONIX ) 40 MG tablet Take 1 tablet (40 mg total) by mouth daily. 30 tablet 3   pregabalin  (LYRICA ) 25 MG capsule Take 1 capsule (25 mg total) by mouth at bedtime. 90 capsule 3   tirzepatide  (MOUNJARO ) 5 MG/0.5ML Pen Inject 5 mg into the skin once a week. 6 mL 1   Ubrogepant  (UBRELVY ) 100 MG TABS Take 1 tablet (100 mg total) by mouth as needed. May repeat dose in 2 hours if needed. Max dose 2 tablets in 2 hours 16 tablet 11    Vitamin D , Ergocalciferol , (DRISDOL ) 1.25 MG (50000 UNIT) CAPS capsule Take 1 capsule by mouth once a week. 4 capsule 11   celecoxib  (CELEBREX ) 200 MG capsule Take 1 capsule (200 mg total) by mouth 2 (two) times daily as needed for arm pain (Patient not taking: Reported on 02/27/2024) 60 capsule 2   No facility-administered medications prior to visit.    Review of Systems;  Patient denies headache, fevers, malaise, unintentional weight loss, skin rash, eye pain, sinus congestion and sinus pain, sore throat, dysphagia,  hemoptysis , cough, dyspnea, wheezing, chest pain, palpitations, orthopnea, edema, abdominal pain, nausea, melena, diarrhea, constipation, flank pain, dysuria, hematuria, urinary  Frequency, nocturia, numbness, tingling, seizures,  Focal weakness, Loss of consciousness,  Tremor, insomnia, depression, anxiety, and suicidal ideation.      Objective:  BP 136/84   Pulse 84   Ht 5' 6 (1.676 m)   Wt 199 lb 3.2 oz (90.4 kg)   SpO2 98%   BMI 32.15 kg/m   BP Readings from Last 3 Encounters:  02/27/24 136/84  12/26/23 (!) 142/82  11/28/23 122/70    Wt Readings from Last 3 Encounters:  02/27/24 199 lb 3.2 oz (90.4 kg)  12/26/23 202 lb 6.1 oz (91.8 kg)  11/28/23 200 lb 9.6 oz (91 kg)    Physical Exam  Lab Results  Component Value Date   HGBA1C 6.4 (A) 11/28/2023   HGBA1C 6.5 (A) 07/28/2023   HGBA1C 5.9 (A) 01/17/2023    Lab Results  Component Value Date   CREATININE 0.86 12/21/2023   CREATININE 0.77 10/09/2023   CREATININE 0.70 03/29/2023    Lab Results  Component Value Date   WBC 6.7 03/29/2023   HGB 12.6 03/29/2023   HCT 37.6 03/29/2023   PLT 203 03/29/2023   GLUCOSE 112 (H) 12/21/2023   CHOL 125 10/09/2023   TRIG 55 10/09/2023   HDL 43 (L) 10/09/2023   LDLDIRECT 88.0 09/21/2021   LDLCALC 68 10/09/2023   ALT 12 10/09/2023   AST 12 10/09/2023   NA 141 12/21/2023   K 4.2 12/21/2023   CL 107 12/21/2023   CREATININE 0.86 12/21/2023   BUN 11  12/21/2023   CO2 25 12/21/2023   TSH 0.48 09/21/2021   HGBA1C 6.4 (A) 11/28/2023   MICROALBUR 14.1 (H) 10/09/2023    No results found.  Assessment & Plan:  .There are no diagnoses linked to this encounter.   I spent 34 minutes on the day of this face to face encounter reviewing patient's  most recent visit with cardiology,  nephrology,  and neurology,  prior relevant surgical and non surgical procedures, recent  labs and imaging studies, counseling on weight management,  reviewing the assessment and plan with patient, and post visit ordering and reviewing of  diagnostics and therapeutics with patient  .   Follow-up: No follow-ups on file.   Verneita LITTIE Kettering, MD

## 2024-02-28 ENCOUNTER — Ambulatory Visit: Payer: Self-pay | Admitting: Internal Medicine

## 2024-02-28 ENCOUNTER — Other Ambulatory Visit: Payer: Self-pay

## 2024-02-28 ENCOUNTER — Other Ambulatory Visit (HOSPITAL_COMMUNITY): Payer: Self-pay

## 2024-02-28 ENCOUNTER — Ambulatory Visit (INDEPENDENT_AMBULATORY_CARE_PROVIDER_SITE_OTHER): Admitting: Orthopaedic Surgery

## 2024-02-28 DIAGNOSIS — M7501 Adhesive capsulitis of right shoulder: Secondary | ICD-10-CM

## 2024-02-28 MED ORDER — MELOXICAM 15 MG PO TABS
15.0000 mg | ORAL_TABLET | Freq: Every day | ORAL | 3 refills | Status: AC
  Filled 2024-02-28: qty 30, 30d supply, fill #0

## 2024-02-28 NOTE — Assessment & Plan Note (Signed)
 Attributed to IBS after GI workup with Stool studies including elastase , fecal fat, GI pathogens,  were negative/normal.  Continue dicyclomine  add IBGARD and benefiber with prebiotics , referral in progress to GI

## 2024-02-28 NOTE — Progress Notes (Signed)
 Post Operative Evaluation    Procedure/Date of Surgery: Right shoulder capsular release, biceps tenodesis 7/22  Interval History:    Presents 6 weeks status post the above procedure.  At this time she is overall doing quite well.  Range of motion and pain is much improved in the shoulder.   PMH/PSH/Family History/Social History/Meds/Allergies:    Past Medical History:  Diagnosis Date   Allergy    Anemia    Anxiety    claustrophobic   Asthma    Back pain    Biceps tendonosis of right shoulder    Cataract    Mixed OU   COVID-19    covid PNA hospitalized 2020, 06/06/21   Diabetes mellitus 2011   did not start metforfin, losing weight   Difficult intubation    tooth got chipped one time   Dyspnea    Essential hypertension    Fatty liver    Food allergy    Frozen shoulder    left, had steroid injection 10/05/21   Headache disorder    History of concussion    HLD (hyperlipidemia)    Hypertensive retinopathy    OU   IBS (irritable bowel syndrome)    Infertility, female    Joint pain    Lactose intolerance    Leg edema    Migraines    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   Palpitation    PONV (postoperative nausea and vomiting)    Post-menopausal    Seborrheic dermatitis    Shoulder impingement syndrome, right    Type 2 diabetes mellitus with complication, with long-term current use of insulin  (HCC) 2018   Uveitis    [facility] in 2020 seen specialist in [place] [facility] last seen 11 or 05/2020   Vitamin D  deficiency    Past Surgical History:  Procedure Laterality Date   ABDOMINAL HYSTERECTOMY  2006   heavy menses, endometriosis, l oophrectomy   BREAST BIOPSY Right 2018   benign   BREAST EXCISIONAL BIOPSY Right 2003   benign   BREAST EXCISIONAL BIOPSY Right 1999   benign   COLONOSCOPY  10/06/2020   INGUINAL HERNIA REPAIR Left 2003   LEFT OOPHORECTOMY  2006   LUMBAR FUSION  2005   L5-S1   POSTERIOR  LUMBAR FUSION 2 WITH HARDWARE REMOVAL Right 12/26/2023   Procedure: ARTHROSCOPY, SHOULDER WITH DEBRIDEMENT BICEPS TENODESIS;  Surgeon: Genelle Standing, MD;  Location: [facility];  Service: Orthopedics;  Laterality: Right;  RIGHT SHOULDER ARTHROSCOPY WITH LYSIS OF ADHESIONS / MANIPULATION UNDER ANESTHESIA   SHOULDER ARTHROSCOPY WITH ROTATOR CUFF REPAIR AND SUBACROMIAL DECOMPRESSION  04/06/2023   Procedure: SHOULDER ARTHROSCOPY WITH ROTATOR CUFF REPAIR , EXTENSIVE DEBRIDEMENT, AND SUBACROMIAL DECOMPRESSION;  Surgeon: Genelle Standing, MD;  Location: [facility];  Service: Orthopedics;;   SHOULDER ARTHROSCOPY WITH SUBACROMIAL DECOMPRESSION AND OPEN ROTATOR C Right 07/14/2016   Procedure: RIGHT SHOULDER ARTHROSCOPY WITH SUBACROMIAL DECOMPRESSION, DISTAL CLAVICLE RESECTION AND MINI OPEN ROTATOR CUFF REPAIR, OPEN BICEP TENDODESIS;  Surgeon: Maude LELON Right, MD;  Location: [facility];  Service: Orthopedics;  Laterality: Right;   SHOULDER CLOSED REDUCTION Right 09/08/2016   Procedure: RIGHT CLOSED MANIPULATION SHOULDER;  Surgeon: Maude LELON Right, MD;  Location: [facility];  Service: Orthopedics;  Laterality: Right;   SHOULDER CLOSED REDUCTION Right 12/26/2023  Procedure: MANIPULATION, JOINT, SHOULDER, WITH ANESTHESIA;  Surgeon: Genelle Standing, MD;  Location: [facility];  Service: Orthopedics;  Laterality: Right;   TUBAL LIGATION     UPPER GASTROINTESTINAL ENDOSCOPY  10/06/2020   Social History   Socioeconomic History   Marital status: Divorced    Spouse name: Not on file   Number of children: 2   Years of education: 14   Highest education level: Master's degree (e.g., MA, MS, MEng, MEd, MSW, MBA)  Occupational History   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  Has a son and a daughter.   Right-handed.   1-3 cups caffeine  weekly.   Social Drivers of Corporate investment banker Strain: Not on file  Food Insecurity: Not on file  Transportation Needs: Not on file  Physical Activity: Not on file  Stress: Not on file  Social Connections: Not on file   Family History  Problem Relation Age of Onset   Diabetes Mother    Heart disease Mother 57   Hypertension Mother    Hyperlipidemia Mother    Heart failure Mother    Stroke Mother    Thyroid  disease Mother    Depression Mother    Sleep apnea Mother    Obesity Mother    Colon polyps Mother    Cancer Father 33       Lung Cancer   Heart disease Father    Mental illness Sister        bipolar, substance abuse,  clean 2 yrs   Hypertension Sister    Cancer Paternal Grandmother 78       breast cancer   Breast cancer Paternal Grandmother    Diabetes Maternal Grandmother    Hypertension Maternal Grandmother    Diabetes Son    Colon cancer Neg Hx    Esophageal cancer Neg Hx    Rectal cancer Neg Hx    Stomach cancer Neg Hx    Allergies  Allergen Reactions   Bamlanivimab  Anaphylaxis, Itching, Palpitations, Rash, Shortness Of Breath and Swelling   Botox [Onabotulinumtoxina ] Shortness Of Breath    syncope   Clindamycin/Lincomycin Other (See Comments)   Contrast Media [Iodinated Contrast Media] Shortness Of Breath and Palpitations   Kiwi Extract Shortness Of Breath   Maxalt [Rizatriptan Benzoate] Anaphylaxis    Chest pain   Nitrous Oxide Shortness Of Breath    syncope   Rizatriptan Benzoate Anaphylaxis    Chest pain   Triptans Shortness Of Breath   Vyepti  [Eptinezumab -Jjmr] Shortness Of Breath, Palpitations and Other (See Comments)    Throat soreness, tachycardia   Aspirin  Nausea And Vomiting    Mouth  blisters   Haemophilus Influenzae Vaccines    Latex     Sometimes causes rash, welts   Mango Flavoring Agent (Non-Screening) Swelling    Lips swell   Vicodin [Hydrocodone-Acetaminophen ]     hallucinations   Erythromycin Rash   Flagyl  [Metronidazole ] Rash   Influenza Virus Vaccine Palpitations   Penicillins Nausea And Vomiting, Rash and Other (See Comments)  Did it involve swelling of the face/tongue/throat, SOB, or low BP? No Did it involve sudden or severe rash/hives, skin peeling, or any reaction on the inside of your mouth or nose? No Did you need to seek medical attention at a hospital or doctor's office? Yes When did it last happen?     patient was 52 years old  If all above answers are "NO", may proceed with cephalosporin use.   Tetracyclines & Related Nausea And Vomiting and Rash   Current Outpatient Medications  Medication Sig Dispense Refill   albuterol  (VENTOLIN  HFA) 108 (90 Base) MCG/ACT inhaler Inhale 2 puffs into the lungs every 6 (six) hours as needed for wheezing or shortness of breath. 18 g 0   Atogepant  (QULIPTA ) 60 MG TABS Take 1 tablet (60 mg total) by mouth daily. 30 tablet 11   atorvastatin  (LIPITOR) 20 MG tablet Take 1 tablet (20 mg total) by mouth daily. 90 tablet 0   Blood Glucose Monitoring Suppl (ACCU-CHEK GUIDE) w/Device KIT Use to check blood sugar 3 times a day. 1 kit 0   butalbital -acetaminophen -caffeine  (FIORICET ) 50-325-40 MG tablet Take 1 tablet by mouth every 6 (six) hours as needed for headache. Do not refill in less than 30 day 30 tablet 5   diazepam  (VALIUM ) 5 MG tablet Take 1 tablet (5 mg total) by mouth every 12 (twelve) hours as needed for anxiety. 30 tablet 5   dicyclomine  (BENTYL ) 20 MG tablet Take 1 tablet (20 mg total) by mouth every 6 (six) hours. 120 tablet 1   EPINEPHrine  0.3 mg/0.3 mL IJ SOAJ injection Inject 0.3 mg into the muscle as needed for anaphylaxis. 2 each 0   glucose blood (ACCU-CHEK GUIDE TEST) test strip Use to check blood  sugar 3 times a day. 300 each 12   Lancets (FREESTYLE) lancets Use to check blood sugar 3 times a day. 300 each 12   losartan  (COZAAR ) 100 MG tablet Take 1 tablet (100 mg total) by mouth daily. 90 tablet 1   meloxicam  (MOBIC ) 15 MG tablet Take 1 tablet (15 mg total) by mouth daily. 14 tablet 0   metFORMIN  (GLUCOPHAGE -XR) 500 MG 24 hr tablet Take 1 tablet (500 mg total) by mouth daily. 90 tablet 3   metoprolol  succinate (TOPROL -XL) 25 MG 24 hr tablet Take 0.5 tablets (12.5 mg total) by mouth daily. 45 tablet 0   ondansetron  (ZOFRAN -ODT) 4 MG disintegrating tablet Take 1 tablet (4 mg total) by mouth every 8 (eight) hours as needed for nausea or vomiting. 20 tablet 0   pantoprazole  (PROTONIX ) 40 MG tablet Take 1 tablet (40 mg total) by mouth daily. (Patient taking differently: Take 40 mg by mouth as needed.) 30 tablet 3   pregabalin  (LYRICA ) 25 MG capsule Take 1 capsule (25 mg total) by mouth at bedtime. 90 capsule 3   tirzepatide  (MOUNJARO ) 5 MG/0.5ML Pen Inject 5 mg into the skin once a week. 6 mL 1   Ubrogepant  (UBRELVY ) 100 MG TABS Take 1 tablet (100 mg total) by mouth as needed. May repeat dose in 2 hours if needed. Max dose 2 tablets in 2 hours 16 tablet 11   Vitamin D , Ergocalciferol , (DRISDOL ) 1.25 MG (50000 UNIT) CAPS capsule Take 1 capsule by mouth once a week. 4 capsule 11   No current facility-administered medications for this visit.   DG Abd 1 View Result Date: 02/27/2024 CLINICAL DATA:  nausea, change in Bowel frequency rule out ileus, SBO EXAM: DG ABDOMEN 1V COMPARISON:  CT AP, 05/15/2021. FINDINGS:  Nonobstructed bowel-gas pattern. Relative paucity of small bowel gas. No radio-opaque calculi or other significant radiographic abnormality are seen. IMPRESSION: Nonobstructed bowel-gas. Electronically Signed   By: Thom Hall M.D.   On: 02/27/2024 16:10    Review of Systems:   A ROS was performed including pertinent positives and negatives as documented in the HPI.   Musculoskeletal  Exam:    There were no vitals taken for this visit.  Right shoulder with significant improvement.  Active forward elevation is to 120 degrees with external Tatian of the side to 30 degrees.  Internal rotation deferred today.  Distal neurosensory exam is intact  Imaging:      I personally reviewed and interpreted the radiographs.   Assessment:   8 weeks status post  right shoulder debridement with biceps tenodesis doing extremely well.  At this time Mobic  has essentially resolved her postoperative inflammatory pain.  I will plan to have an open prescription of this for her.  I do believe she is cleared from perspective of an elbow cubital tunnel surgery at this point and would defer to Dr. Erwin for that.  Plan for referral for that  Plan :    - Plan for referral to Dr. Agarwala for cubital tunnel discussion      I personally saw and evaluated the patient, and participated in the management and treatment plan.  Elspeth Parker, MD Attending Physician, Orthopedic Surgery  This document was dictated using Dragon voice recognition software. A reasonable attempt at proof reading has been made to minimize errors.

## 2024-03-01 ENCOUNTER — Other Ambulatory Visit: Payer: Self-pay

## 2024-03-06 ENCOUNTER — Telehealth: Payer: Commercial Managed Care - PPO | Admitting: Neurology

## 2024-03-06 ENCOUNTER — Other Ambulatory Visit (HOSPITAL_COMMUNITY): Payer: Self-pay

## 2024-03-06 DIAGNOSIS — G43E09 Chronic migraine with aura, not intractable, without status migrainosus: Secondary | ICD-10-CM

## 2024-03-06 DIAGNOSIS — G43709 Chronic migraine without aura, not intractable, without status migrainosus: Secondary | ICD-10-CM

## 2024-03-06 MED ORDER — QULIPTA 60 MG PO TABS
1.0000 | ORAL_TABLET | Freq: Every day | ORAL | 11 refills | Status: AC
  Filled 2024-03-06: qty 30, 30d supply, fill #0
  Filled 2024-03-25 – 2024-05-01 (×2): qty 30, 30d supply, fill #1
  Filled 2024-06-12: qty 30, 30d supply, fill #2

## 2024-03-06 MED ORDER — UBRELVY 100 MG PO TABS
100.0000 mg | ORAL_TABLET | ORAL | 11 refills | Status: AC | PRN
  Filled 2024-03-06: qty 16, 30d supply, fill #0

## 2024-03-06 NOTE — Patient Instructions (Signed)
 Great to see you today! Continue current medications Please call for increase in headache Follow-up in 1 year.  Thanks!!

## 2024-03-06 NOTE — Progress Notes (Signed)
 Virtual Visit via Video Note  I connected with [name] on 03/06/24 at  3:15 PM EDT by a video enabled telemedicine application and verified that I am speaking with the correct person using two identifiers.  Location: Patient: in her car  Provider: in the office    I discussed the limitations of evaluation and management by telemedicine and the availability of in person appointments. The patient expressed understanding and agreed to proceed.  [name]  PRIMARY NEUROLOGIST: [facility]  History of Present Illness: 03/06/24 SS: VV today. Doing great with Qulipta . Migraines doing great. Ubrelvy  works great. She is able to get complete relief with Ubrelvy .  On average 1-2 migraines a month. Not missing work due to migraines. July 2025 frozen shoulder surgery. Issues with IBS, doesn't think IBS contributes. Very pleased with Qulipta !  03/08/23 SS: Remains on Qulipta  60 mg daily, uses Ubrelvy  PRN. Migraines are less frequent. Qulipta  is the most effective she has ever had. On average 3-4 migraines a month. Ubrelvy  makes her nauseated, takes Zofran . Sometimes has to take 2 Ubrelvy . Last week had to miss 2 days of work. Going to have shoulder surgery 10/31. A1C better at 5.9. sometimes is late getting Ubrelvy  due to insurance issue. PCP has sent in Fioricet , she rarely uses.   08/24/22 SS: When last seen 02/22/2022 headaches were improved with Qulipta .  Ubrelvy  was given along with chlorzoxazone. No longer has debilitating headaches. Has only missed 2 days of work since September. Has quiet room at staff education. Qulipta  has been very helpful. Started new job on 08/15/22, has had headache for about 2 weeks, not severe migraine. Ubrelvy  works well, last week had to take 2 tablets, was 1st time had to take 2 in the same day. Has the same box originally prescribed. Has massage on Friday, deep tissue massage.    02/22/22 Dr. Rush Interval History: Headaches have improved in frequency and severity  since starting Qulipta . She has had about 9 headaches in the past month. Takes Excedrin as needed but has been trying not to take anything because she is concerned about rebound headaches. She continues to have significant neck tension and tightness. She is going to PT for her shoulder but doesn't feel this has been particularly helpful.   Headache days per month: 9 Headache free days per month: 21   Current Headache Regimen: Preventative: Qulipta  60 mg daily Abortive: Excedrin   Prior Therapies                                  Rescue: Baclofen  Flexeril  Robaxin  Fioricet  Maxalt - anaphylaxis Nurtec   Preventive: Ajovy  Emgality  Vyepti  - chest pain Botox - bradycardia, chest pain, shortness of breath Topamax  - eye pressure Zonisamide  Effexor  - diarrhea Nortriptyline  Verapamil Propranolol Nerve block   Observations/Objective: Via video visit  Assessment and Plan: 1.  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 -Continue Qulipta  60 mg daily for migraine preventative -Continue Ubrelvy  100 mg as needed for acute migraine treatment -next steps: Consider Cymbalta, verapamil, gabapentin  for prevention. Consider diclofenac  for rescue   Follow Up Instructions: 1 year MyChart video visit   I discussed the assessment and treatment plan with the patient. The patient was provided an opportunity to ask questions and all were answered. The patient agreed with the plan and demonstrated an understanding of the instructions.   The patient was  advised to call back or seek an in-person evaluation if the symptoms worsen or if the condition fails to improve as anticipated.  Lauraine Gayland MANDES, DNP  [facility] [address] [place], [place] [address] [phone]

## 2024-03-11 ENCOUNTER — Encounter: Payer: Self-pay | Admitting: Internal Medicine

## 2024-03-11 ENCOUNTER — Ambulatory Visit: Admitting: Internal Medicine

## 2024-03-11 ENCOUNTER — Other Ambulatory Visit (HOSPITAL_COMMUNITY): Payer: Self-pay

## 2024-03-11 ENCOUNTER — Other Ambulatory Visit: Payer: Self-pay

## 2024-03-11 VITALS — BP 120/70 | HR 72 | Ht 66.0 in | Wt 200.2 lb

## 2024-03-11 DIAGNOSIS — E7849 Other hyperlipidemia: Secondary | ICD-10-CM | POA: Diagnosis not present

## 2024-03-11 DIAGNOSIS — Z7985 Long-term (current) use of injectable non-insulin antidiabetic drugs: Secondary | ICD-10-CM | POA: Diagnosis not present

## 2024-03-11 DIAGNOSIS — E66811 Obesity, class 1: Secondary | ICD-10-CM

## 2024-03-11 DIAGNOSIS — E1140 Type 2 diabetes mellitus with diabetic neuropathy, unspecified: Secondary | ICD-10-CM | POA: Diagnosis not present

## 2024-03-11 DIAGNOSIS — Z7984 Long term (current) use of oral hypoglycemic drugs: Secondary | ICD-10-CM

## 2024-03-11 LAB — POCT GLYCOSYLATED HEMOGLOBIN (HGB A1C): Hemoglobin A1C: 6.2 % — AB (ref 4.0–5.6)

## 2024-03-11 MED ORDER — FREESTYLE LIBRE 3 PLUS SENSOR MISC
1.0000 | 3 refills | Status: AC
  Filled 2024-03-11: qty 6, 84d supply, fill #0
  Filled 2024-03-13: qty 2, 28d supply, fill #0
  Filled 2024-04-09: qty 2, 30d supply, fill #0
  Filled 2024-04-09: qty 2, 28d supply, fill #0
  Filled 2024-06-11: qty 2, 30d supply, fill #1
  Filled 2024-07-03: qty 2, 30d supply, fill #2

## 2024-03-11 NOTE — Progress Notes (Signed)
 Patient ID: [name], female   DOB: 1971-01-21, 53 y.o.   MRN: [id number]  HPI: [name] is a 53 y.o.-year-old female, returning for follow-up for DM2, dx in 2013, with history of GDM in 1990 and 2004, previously insulin -dependent since 2018, but currently off insulin , now better controlled, with complications (mild CKD, PN).  She was previously seen by Dr. Kassie. Last visit with me 4 months ago (virtual)  Interim history: No increased urination, blurry vision. She has constipation, diarrhea - related to IBS. She does not feel that they are related to Mounjaro . She had shoulder sx 04/06/2023. She has pbs with nerve compression, carpal and cubital tunnel sd.  She was in PT/OT and started Lyrica . She had another shoulder sx 12/14/2023.  She still has pain when raising her arms.  Reviewed HbA1c: Lab Results  Component Value Date   HGBA1C 6.4 (A) 11/28/2023   HGBA1C 6.5 (A) 07/28/2023   HGBA1C 5.9 (A) 01/17/2023   HGBA1C 6.3 (A) 06/30/2022   HGBA1C 6.5 (A) 01/28/2022   HGBA1C 8.1 (A) 10/19/2021   HGBA1C 7.5 (A) 08/03/2021   HGBA1C 7.6 (A) 05/03/2021   HGBA1C 7.0 (A) 01/26/2021   HGBA1C 6.9 (A) 10/26/2020   Previous on: - Ozempic  2 mg weekly - Humalog  22 -24 (30) units 2x a day after meals (patient has mostly 2 meals a day) She tried Victoza  >> dysphagia. She tried Jardiance  >> yeast infections. She was taking Metformin  11/2020 after he had IBS symptoms.  Now on: - Ozempic  2 >> 1 mg weekly (decreased 08/2022 2/2 hypoglycemia) >> nausea >> Mounjaro  2.5 >> 5 mg weekly - Metformin  ER 500 mg with dinner  She was previously on Tresiba , started 10/2021: 14, then 20 units daily, but was able to come off. She had N/V/D with higher doses of metformin  ER.  Pt checks her sugars more than 4 times a day with her Dexcom G7 CGM:  Previously:  Previously:   Lowest sugar was 63 (sensor) >> 45 >> 68 >> <70 >> 58; she has hypoglycemia awareness at 70.  Highest sugar was 300s >>  ... 265 >> 200s >> 248.  -+ Stage IIIa CKD, last BUN/creatinine:  Lab Results  Component Value Date   BUN 11 12/21/2023   BUN 10 10/09/2023   CREATININE 0.86 12/21/2023   CREATININE 0.77 10/09/2023   Lab Results  Component Value Date   MICRALBCREAT 39.2 (H) 10/09/2023   MICRALBCREAT 29.3 08/22/2023   MICRALBCREAT 14.4 07/06/2017  She on Losartan .  -+ HL; last set of lipids: Lab Results  Component Value Date   CHOL 125 10/09/2023   HDL 43 (L) 10/09/2023   LDLCALC 68 10/09/2023   LDLDIRECT 88.0 09/21/2021   TRIG 55 10/09/2023   CHOLHDL 2.9 10/09/2023  On Lipitor 20 mg daily.  - last eye exam was 05/10/2023. No [facility].  - + numbness and tingling in her feet. Last foot exam: 08/21/2023.  On ASA 81.  Patient also has a history of HTN, fatty liver, IBS.  ROS: + see HPI  Past Medical History:  Diagnosis Date   Allergy    Anemia    Anxiety    claustrophobic   Asthma    Back pain    Biceps tendonosis of right shoulder    Cataract    Mixed OU   COVID-19    covid PNA hospitalized 2020, 06/06/21   Diabetes mellitus 2011   did not start metforfin, losing weight   Difficult intubation  tooth got chipped one time   Dyspnea    Essential hypertension    Fatty liver    Food allergy    Frozen shoulder    left, had steroid injection 10/05/21   Headache disorder    History of concussion    HLD (hyperlipidemia)    Hypertensive retinopathy    OU   IBS (irritable bowel syndrome)    Infertility, female    Joint pain    Lactose intolerance    Leg edema    Migraines    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   Palpitation    PONV (postoperative nausea and vomiting)    Post-menopausal    Seborrheic dermatitis    Shoulder impingement syndrome, right    Type 2 diabetes mellitus with complication, with long-term current use of insulin  (HCC) 2018   Uveitis    [facility] est in 2020 seen specialist in [place] [facility] last seen 11 or 05/2020    Vitamin D  deficiency    Past Surgical History:  Procedure Laterality Date   ABDOMINAL HYSTERECTOMY  2006   heavy menses, endometriosis, l oophrectomy   BREAST BIOPSY Right 2018   benign   BREAST EXCISIONAL BIOPSY Right 2003   benign   BREAST EXCISIONAL BIOPSY Right 1999   benign   COLONOSCOPY  10/06/2020   INGUINAL HERNIA REPAIR Left 2003   LEFT OOPHORECTOMY  2006   LUMBAR FUSION  2005   L5-S1   POSTERIOR LUMBAR FUSION 2 WITH HARDWARE REMOVAL Right 12/26/2023   Procedure: ARTHROSCOPY, SHOULDER WITH DEBRIDEMENT BICEPS TENODESIS;  Surgeon: Genelle Standing, MD;  Location: [facility];  Service: Orthopedics;  Laterality: Right;  RIGHT SHOULDER ARTHROSCOPY WITH LYSIS OF ADHESIONS / MANIPULATION UNDER ANESTHESIA   SHOULDER ARTHROSCOPY WITH ROTATOR CUFF REPAIR AND SUBACROMIAL DECOMPRESSION  04/06/2023   Procedure: SHOULDER ARTHROSCOPY WITH ROTATOR CUFF REPAIR , EXTENSIVE DEBRIDEMENT, AND SUBACROMIAL DECOMPRESSION;  Surgeon: Genelle Standing, MD;  Location: [facility];  Service: Orthopedics;;   SHOULDER ARTHROSCOPY WITH SUBACROMIAL DECOMPRESSION AND OPEN ROTATOR C Right 07/14/2016   Procedure: RIGHT SHOULDER ARTHROSCOPY WITH SUBACROMIAL DECOMPRESSION, DISTAL CLAVICLE RESECTION AND MINI OPEN ROTATOR CUFF REPAIR, OPEN BICEP TENDODESIS;  Surgeon: Maude LELON Right, MD;  Location: [facility];  Service: Orthopedics;  Laterality: Right;   SHOULDER CLOSED REDUCTION Right 09/08/2016   Procedure: RIGHT CLOSED MANIPULATION SHOULDER;  Surgeon: Maude LELON Right, MD;  Location: [facility];  Service: Orthopedics;  Laterality: Right;   SHOULDER CLOSED REDUCTION Right 12/26/2023   Procedure: MANIPULATION, JOINT, SHOULDER, WITH ANESTHESIA;  Surgeon: Genelle Standing, MD;  Location: [facility];  Service: Orthopedics;  Laterality: Right;   TUBAL LIGATION     UPPER GASTROINTESTINAL ENDOSCOPY  10/06/2020   Social History   Socioeconomic History   Marital  status: Divorced    Spouse name: Not on file   Number of children: 2   Years of education: 14   Highest education level: Master's degree (e.g., MA, MS, MEng, MEd, MSW, MBA)  Occupational History   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  Has a son and a daughter.   Right-handed.   1-3 cups caffeine  weekly.   Social Drivers of Corporate investment banker Strain: Not on file  Food Insecurity: Not on file  Transportation Needs: Not on file  Physical Activity: Not on file  Stress: Not on file  Social Connections: Not on file  Intimate Partner Violence: Not on file   Current Outpatient Medications on File Prior to Visit  Medication Sig Dispense Refill   albuterol  (VENTOLIN  HFA) 108 (90 Base) MCG/ACT inhaler Inhale 2 puffs into the lungs every 6 (six) hours as needed for wheezing or shortness of breath. 18 g 0   Atogepant  (QULIPTA ) 60 MG TABS Take 1 tablet (60 mg total) by mouth daily. 30 tablet 11   atorvastatin  (LIPITOR) 20 MG tablet Take 1 tablet (20 mg total) by mouth daily. 90 tablet 0   Blood Glucose Monitoring Suppl (ACCU-CHEK GUIDE) w/Device KIT Use to check blood sugar 3 times a day. 1 kit 0   butalbital -acetaminophen -caffeine  (FIORICET ) 50-325-40 MG tablet Take 1 tablet by mouth every 6 (six) hours as needed for headache. Do not refill in less than 30 day 30 tablet 5   diazepam  (VALIUM ) 5 MG tablet Take 1 tablet (5 mg total) by mouth every 12 (twelve) hours as needed for anxiety. 30 tablet 5   dicyclomine  (BENTYL ) 20 MG tablet Take 1 tablet (20 mg total) by mouth every 6 (six) hours. 120 tablet 1   EPINEPHrine  0.3 mg/0.3  mL IJ SOAJ injection Inject 0.3 mg into the muscle as needed for anaphylaxis. 2 each 0   glucose blood (ACCU-CHEK GUIDE TEST) test strip Use to check blood sugar 3 times a day. 300 each 12   Lancets (FREESTYLE) lancets Use to check blood sugar 3 times a day. 300 each 12   losartan  (COZAAR ) 100 MG tablet Take 1 tablet (100 mg total) by mouth daily. 90 tablet 1   meloxicam  (MOBIC ) 15 MG tablet Take 1 tablet (15 mg total) by mouth daily. 30 tablet 3   metFORMIN  (GLUCOPHAGE -XR) 500 MG 24 hr tablet Take 1 tablet (500 mg total) by mouth daily. 90 tablet 3   metoprolol  succinate (TOPROL -XL) 25 MG 24 hr tablet Take 0.5 tablets (12.5 mg total) by mouth daily. 45 tablet 0   ondansetron  (ZOFRAN -ODT) 4 MG disintegrating tablet Take 1 tablet (4 mg total) by mouth every 8 (eight) hours as needed for nausea or vomiting. 20 tablet 0   pantoprazole  (PROTONIX ) 40 MG tablet Take 1 tablet (40 mg total) by mouth daily. (Patient taking differently: Take 40 mg by mouth as needed.) 30 tablet 3   pregabalin  (LYRICA ) 25 MG capsule Take 1 capsule (25 mg total) by mouth at bedtime. 90 capsule 3   tirzepatide  (MOUNJARO ) 5 MG/0.5ML Pen Inject 5 mg into the skin once a week. 6 mL 1   Ubrogepant  (UBRELVY ) 100 MG TABS Take 1 tablet (100 mg total) by mouth as needed. May repeat dose in 2 hours if needed. Max dose 2 tablets in 2 hours 16 tablet 11   Vitamin D , Ergocalciferol , (DRISDOL ) 1.25 MG (50000 UNIT) CAPS capsule Take 1 capsule by mouth once a week. 4 capsule 11   No current facility-administered medications on file prior to visit.   Allergies  Allergen Reactions   Bamlanivimab  Anaphylaxis, Itching, Palpitations, Rash, Shortness Of Breath and Swelling   Botox [Onabotulinumtoxina ] Shortness Of Breath    syncope   Clindamycin/Lincomycin Other (See  Comments)   Contrast Media [Iodinated Contrast Media] Shortness Of Breath and Palpitations   Kiwi Extract Shortness Of Breath   Maxalt [Rizatriptan Benzoate] Anaphylaxis     Chest pain   Nitrous Oxide Shortness Of Breath    syncope   Rizatriptan Benzoate Anaphylaxis    Chest pain   Triptans Shortness Of Breath   Vyepti  [Eptinezumab -Jjmr] Shortness Of Breath, Palpitations and Other (See Comments)    Throat soreness, tachycardia   Aspirin  Nausea And Vomiting    Mouth blisters   Haemophilus Influenzae Vaccines    Latex     Sometimes causes rash, welts   Mango Flavoring Agent (Non-Screening) Swelling    Lips swell   Vicodin [Hydrocodone-Acetaminophen ]     hallucinations   Erythromycin Rash   Flagyl  [Metronidazole ] Rash   Influenza Virus Vaccine Palpitations   Penicillins Nausea And Vomiting, Rash and Other (See Comments)    Did it involve swelling of the face/tongue/throat, SOB, or low BP? No Did it involve sudden or severe rash/hives, skin peeling, or any reaction on the inside of your mouth or nose? No Did you need to seek medical attention at a hospital or doctor's office? Yes When did it last happen?     patient was 53 years old  If all above answers are "NO", may proceed with cephalosporin use.   Tetracyclines & Related Nausea And Vomiting and Rash   Family History  Problem Relation Age of Onset   Diabetes Mother    Heart disease Mother 2   Hypertension Mother    Hyperlipidemia Mother    Heart failure Mother    Stroke Mother    Thyroid  disease Mother    Depression Mother    Sleep apnea Mother    Obesity Mother    Colon polyps Mother    Cancer Father 31       Lung Cancer   Heart disease Father    Mental illness Sister        bipolar, substance abuse,  clean 2 yrs   Hypertension Sister    Cancer Paternal Grandmother 68       breast cancer   Breast cancer Paternal Grandmother    Diabetes Maternal Grandmother    Hypertension Maternal Grandmother    Diabetes Son    Colon cancer Neg Hx    Esophageal cancer Neg Hx    Rectal cancer Neg Hx    Stomach cancer Neg Hx    PE: BP 120/70   Pulse 72   Ht 5' 6 (1.676 m)   Wt 200 lb 3.2 oz  (90.8 kg)   SpO2 99%   BMI 32.31 kg/m   Wt Readings from Last 15 Encounters:  03/11/24 200 lb 3.2 oz (90.8 kg)  02/27/24 199 lb 3.2 oz (90.4 kg)  12/26/23 202 lb 6.1 oz (91.8 kg)  11/28/23 200 lb 9.6 oz (91 kg)  11/19/23 200 lb 3.2 oz (90.8 kg)  11/06/23 201 lb 12.8 oz (91.5 kg)  08/29/23 200 lb (90.7 kg)  08/21/23 198 lb 12.8 oz (90.2 kg)  08/01/23 201 lb 12.8 oz (91.5 kg)  04/12/23 200 lb 2 oz (90.8 kg)  04/06/23 183 lb (83 kg)  03/29/23 183 lb (83 kg)  01/17/23 190 lb 12.8 oz (86.5 kg)  01/02/23 180 lb (81.6 kg)  08/24/22 180 lb 8 oz (81.9 kg)   Constitutional: overweight, in NAD Eyes:  EOMI, no exophthalmos ENT: no neck masses, no cervical lymphadenopathy Cardiovascular: RRR, No MRG Respiratory: CTA B Musculoskeletal: no deformities  Skin:no rashes Neurological: no tremor with outstretched hands  ASSESSMENT: 1. DM2, currently non insulin -dependent, better controlled, with complications - CKD - mild - PN  2. HL  3.  Obesity class I  PLAN:  1. Patient with longstanding, previously uncontrolled type 2 diabetes, on weekly GLP-1/GIP receptor agonist, changed from Ozempic  to Mounjaro  at last visit due to nausea.  HbA1c was lower at that time, at 6.4%, at goal.  Blood sugars were fluctuating within the target range, with only occasional hyperglycemic spikes. CGM interpretation: -At today's visit, we reviewed her CGM downloads: It appears that 94% of values are in target range (goal >70%), while 6% are higher than 180 (goal <25%), and 0% are lower than 70 (goal <4%).  The calculated average blood sugar is 141.  The projected HbA1c for the next 3 months (GMI) is 6.7%. -Reviewing the CGM trends, sugars appear to be fluctuating within the target range, with only rare hyperglycemic exceptions after lunch.  For now, we discussed about continuing the same regimen.  Her Dexcom is not covered by her insurance anymore.  She will try to obtain a freestyle libre 3+ CGM.  Given coupon. - I  suggested to:  Patient Instructions  Please continue: - Mounjaro  5 mg weekly - Metformin  ER 500 mg daily  Please return in 4 months.  - we checked her HbA1c: 6.2% (lower) - advised to check sugars at different times of the day - 4x a day, rotating check times - advised for yearly eye exams >> she is UTD - she had a slightly elevated ACR.  She was advised to continue losartan  and keep the blood pressure under 120/70.  - return to clinic in 4 mo  2. HL - Latest lipid panel was at goal with the exception of a slightly low HDL level: Lab Results  Component Value Date   CHOL 125 10/09/2023   HDL 43 (L) 10/09/2023   LDLCALC 68 10/09/2023   LDLDIRECT 88.0 09/21/2021   TRIG 55 10/09/2023   CHOLHDL 2.9 10/09/2023  - she continues on Lipitor 20 mg daily without side effects  3.  Obesity class I - She was able to lose approximately 60 pounds after starting Ozempic  - At last visit I suggested to change from Ozempic  to Mounjaro  due to nausea in the third day after taking Ozempic  - She is taking Mounjaro  and tolerates it well.  She mentions diarrhea and constipation occasionally, but she feels that these are related to her IBS and not to Mounjaro .  We discussed that if she has an episode of IBS with diarrhea she can hold metformin .  Lela Fendt, MD PhD [facility] Endocrinology

## 2024-03-11 NOTE — Addendum Note (Signed)
 Addended by: CLEOTILDE ROLIN RAMAN on: 03/11/2024 04:29 PM   Modules accepted: Orders

## 2024-03-11 NOTE — Patient Instructions (Addendum)
 Please continue: - Mounjaro  5 mg weekly - Metformin  ER 500 mg daily   Please return in 4-6 months.

## 2024-03-12 ENCOUNTER — Other Ambulatory Visit: Payer: Self-pay

## 2024-03-13 ENCOUNTER — Telehealth: Payer: Commercial Managed Care - PPO | Admitting: Neurology

## 2024-03-13 ENCOUNTER — Other Ambulatory Visit (HOSPITAL_COMMUNITY): Payer: Self-pay

## 2024-03-18 ENCOUNTER — Ambulatory Visit: Admitting: Internal Medicine

## 2024-03-19 ENCOUNTER — Encounter: Payer: Self-pay | Admitting: Physical Medicine and Rehabilitation

## 2024-03-19 ENCOUNTER — Encounter: Attending: Physical Medicine and Rehabilitation | Admitting: Physical Medicine and Rehabilitation

## 2024-03-19 VITALS — BP 143/89 | HR 78 | Ht 66.0 in | Wt 199.0 lb

## 2024-03-19 DIAGNOSIS — S46011S Strain of muscle(s) and tendon(s) of the rotator cuff of right shoulder, sequela: Secondary | ICD-10-CM | POA: Insufficient documentation

## 2024-03-19 DIAGNOSIS — G5621 Lesion of ulnar nerve, right upper limb: Secondary | ICD-10-CM | POA: Diagnosis not present

## 2024-03-19 MED ORDER — CAPSAICIN-CLEANSING GEL 8 % EX KIT
3.0000 | PACK | Freq: Once | CUTANEOUS | Status: AC
  Administered 2024-03-19: 3 via TOPICAL

## 2024-03-19 NOTE — Progress Notes (Signed)
 Subjective:     Patient ID: [name], female   DOB: Jul 03, 1970, 53 y.o.   MRN: [id number]  HPI Mrs. Falkenstein is a 53 year old man who presents to establish care for traumatic rotator cuff tear.  1) Traumatic rotator cuff tear: -failed surgery, celebrex  (affects her stomach even with pantoprazole ), Neurontin , Lyrica  -has neuropathic pain and nothing touches it -when she is in therapy her shoulder hurts -average pain is 3/10 -discussed that she has returned to work -discussed her plan for arthroscopic surgery with Dr. Genelle 3 months ago she received another surgery to her shoulder, but the nerve pain in her right hand, elbow, and shoulder persist.  -Dr. Genelle recommended following with Dr. Erwin for potential cubital and carpal tunnel surgery who did not highly recommend repeat surgery   2) Carpal tunnel syndrome: -her 3rd and 4th digits are numb -drops papers -she is doing OT and PT -would like to try Qutenza  today -has been referred to ortho  3) Cubital tunnel pain:  -diagnosed with EMG -would like to try Qutenza  today -has been referred to ortho    Family History  Problem Relation Age of Onset   Diabetes Mother    Heart disease Mother 48   Hypertension Mother    Hyperlipidemia Mother    Heart failure Mother    Stroke Mother    Thyroid  disease Mother    Depression Mother    Sleep apnea Mother    Obesity Mother    Colon polyps Mother    Cancer Father 56       Lung Cancer   Heart disease Father    Mental illness Sister        bipolar, substance abuse,  clean 2 yrs   Hypertension Sister    Cancer Paternal Grandmother 42       breast cancer   Breast cancer Paternal Grandmother    Diabetes Maternal Grandmother    Hypertension Maternal Grandmother    Diabetes Son    Colon cancer Neg Hx    Esophageal cancer Neg Hx    Rectal cancer Neg Hx    Stomach cancer Neg Hx    Social History   Socioeconomic History   Marital status: Divorced    Spouse name: Not  on file   Number of children: 2   Years of education: 14   Highest education level: Manufacturing engineer (e.g., MA, MS, MEng, MEd, MSW, MBA)  Occupational History   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  Has a son and a daughter.   Right-handed.   1-3 cups caffeine  weekly.   Social Drivers of Corporate investment banker Strain: Not on file  Food Insecurity: Not on file  Transportation Needs: Not on file  Physical Activity: Not on file  Stress: Not on file  Social Connections: Not on file   Past Surgical History:  Procedure Laterality Date   ABDOMINAL HYSTERECTOMY  2006   heavy menses, endometriosis, l oophrectomy   BREAST BIOPSY Right 2018   benign   BREAST EXCISIONAL  BIOPSY Right 2003   benign   BREAST EXCISIONAL BIOPSY Right 1999   benign   COLONOSCOPY  10/06/2020   INGUINAL HERNIA REPAIR Left 2003   LEFT OOPHORECTOMY  2006   LUMBAR FUSION  2005   L5-S1   POSTERIOR LUMBAR FUSION 2 WITH HARDWARE REMOVAL Right 12/26/2023   Procedure: ARTHROSCOPY, SHOULDER WITH DEBRIDEMENT BICEPS TENODESIS;  Surgeon: Genelle Standing, MD;  Location: [facility];  Service: Orthopedics;  Laterality: Right;  RIGHT SHOULDER ARTHROSCOPY WITH LYSIS OF ADHESIONS / MANIPULATION UNDER ANESTHESIA   SHOULDER ARTHROSCOPY WITH ROTATOR CUFF REPAIR AND SUBACROMIAL DECOMPRESSION  04/06/2023   Procedure: SHOULDER ARTHROSCOPY WITH ROTATOR CUFF REPAIR , EXTENSIVE DEBRIDEMENT, AND SUBACROMIAL DECOMPRESSION;  Surgeon: Genelle Standing, MD;  Location: [facility];  Service: Orthopedics;;   SHOULDER ARTHROSCOPY WITH SUBACROMIAL DECOMPRESSION AND OPEN  ROTATOR C Right 07/14/2016   Procedure: RIGHT SHOULDER ARTHROSCOPY WITH SUBACROMIAL DECOMPRESSION, DISTAL CLAVICLE RESECTION AND MINI OPEN ROTATOR CUFF REPAIR, OPEN BICEP TENDODESIS;  Surgeon: Maude LELON Right, MD;  Location: [facility];  Service: Orthopedics;  Laterality: Right;   SHOULDER CLOSED REDUCTION Right 09/08/2016   Procedure: RIGHT CLOSED MANIPULATION SHOULDER;  Surgeon: Maude LELON Right, MD;  Location: [facility];  Service: Orthopedics;  Laterality: Right;   SHOULDER CLOSED REDUCTION Right 12/26/2023   Procedure: MANIPULATION, JOINT, SHOULDER, WITH ANESTHESIA;  Surgeon: Genelle Standing, MD;  Location[facility];  Service: Orthopedics;  Laterality: Right;   TUBAL LIGATION     UPPER GASTROINTESTINAL ENDOSCOPY  10/06/2020   Past Surgical History:  Procedure Laterality Date   ABDOMINAL HYSTERECTOMY  2006   heavy menses, endometriosis, l oophrectomy   BREAST BIOPSY Right 2018   benign   BREAST EXCISIONAL BIOPSY Right 2003   benign   BREAST EXCISIONAL BIOPSY Right 1999   benign   COLONOSCOPY  10/06/2020   INGUINAL HERNIA REPAIR Left 2003   LEFT OOPHORECTOMY  2006   LUMBAR FUSION  2005   L5-S1   POSTERIOR LUMBAR FUSION 2 WITH HARDWARE REMOVAL Right 12/26/2023   Procedure: ARTHROSCOPY, SHOULDER WITH DEBRIDEMENT BICEPS TENODESIS;  Surgeon: Genelle Standing, MD;  Location: [facility];  Service: Orthopedics;  Laterality: Right;  RIGHT SHOULDER ARTHROSCOPY WITH LYSIS OF ADHESIONS / MANIPULATION UNDER ANESTHESIA   SHOULDER ARTHROSCOPY WITH ROTATOR CUFF REPAIR AND SUBACROMIAL DECOMPRESSION  04/06/2023   Procedure: SHOULDER ARTHROSCOPY WITH ROTATOR CUFF REPAIR , EXTENSIVE DEBRIDEMENT, AND SUBACROMIAL DECOMPRESSION;  Surgeon: Genelle Standing, MD;  Location: [facility];  Service: Orthopedics;;   SHOULDER ARTHROSCOPY WITH SUBACROMIAL DECOMPRESSION AND OPEN ROTATOR C Right 07/14/2016   Procedure: RIGHT SHOULDER ARTHROSCOPY WITH  SUBACROMIAL DECOMPRESSION, DISTAL CLAVICLE RESECTION AND MINI OPEN ROTATOR CUFF REPAIR, OPEN BICEP TENDODESIS;  Surgeon: Maude LELON Right, MD;  Location: [facility];  Service: Orthopedics;  Laterality: Right;   SHOULDER CLOSED REDUCTION Right 09/08/2016   Procedure: RIGHT CLOSED MANIPULATION SHOULDER;  Surgeon: Maude LELON Right, MD;  Location:  SURGERY CENTER;  Service: Orthopedics;  Laterality: Right;   SHOULDER CLOSED REDUCTION Right 12/26/2023   Procedure: MANIPULATION, JOINT, SHOULDER, WITH ANESTHESIA;  Surgeon: Genelle Standing, MD;  Location:  SURGERY CENTER;  Service: Orthopedics;  Laterality: Right;   TUBAL LIGATION     UPPER GASTROINTESTINAL ENDOSCOPY  10/06/2020   Past Medical History:  Diagnosis Date   Allergy    Anemia    Anxiety    claustrophobic   Asthma    Back pain    Biceps tendonosis of right shoulder  Cataract    Mixed OU   COVID-19    covid PNA hospitalized 2020, 06/06/21   Diabetes mellitus 2011   did not start metforfin, losing weight   Difficult intubation    tooth got chipped one time   Dyspnea    Essential hypertension    Fatty liver    Food allergy    Frozen shoulder    left, had steroid injection 10/05/21   Headache disorder    History of concussion    HLD (hyperlipidemia)    Hypertensive retinopathy    OU   IBS (irritable bowel syndrome)    Infertility, female    Joint pain    Lactose intolerance    Leg edema    Migraines    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 eye est in 2020 seen specialist in [place] [facility] last seen 11 or 05/2020   Vitamin D  deficiency    BP (!) 143/89 (BP Location: Left Arm, Patient Position: Sitting, Cuff  Size: Large)   Pulse 78   Ht 5' 6 (1.676 m)   Wt 199 lb (90.3 kg)   SpO2 96%   BMI 32.12 kg/m   Opioid Risk Score:   Fall Risk Score:  `1  Depression screen [facility] 2/9     02/27/2024    2:56 PM 11/06/2023    9:42 AM 08/21/2023    4:17 PM 08/01/2023    8:29 AM 07/01/2022    2:38 PM 05/27/2022    8:20 AM 09/21/2021    3:13 PM  Depression screen PHQ 2/9  Decreased Interest 1 0 1 0 0 0 0  Down, Depressed, Hopeless 0 0 0 1 1 0 0  PHQ - 2 Score 1 0 1 1 1  0 0  Altered sleeping   0 0 0    Tired, decreased energy   0 0 0    Change in appetite   0 0 0    Feeling bad or failure about yourself    0 0 0    Trouble concentrating   1 1 0    Moving slowly or fidgety/restless   0 0 0    Suicidal thoughts   0 0 0    PHQ-9 Score   2 2 1     Difficult doing work/chores   Somewhat difficult Not difficult at all Not difficult at all      Review of Systems +Right shoulder, elbow, and hand pain    Objective:   Physical Exam Gen: no distress, normal appearing HEENT: oral mucosa pink and moist, NCAT Cardio: Reg rate Chest: normal effort, normal rate of breathing Abd: soft, non-distended Ext: no edema Psych: pleasant, normal affect Skin: intact Neuro: Alert and oriented x3 Decreased sensation in right third and 4th fingers, limited range of motion of right shoulder, stable 10/14    Assessment:      Plan:     1) Chronic Pain Syndrome secondary to right sided rotator cuff tear -Discussed current symptoms of pain and history of pain.  -Discussed benefits of exercise in reducing pain. -Discussed following foods that may reduce pain: 1) Ginger (especially studied for arthritis)- reduce leukotriene production to decrease inflammation 2) Blueberries-  high in phytonutrients that decrease inflammation 3) Salmon- marine omega-3s reduce joint swelling and pain 4) Pumpkin seeds- reduce inflammation 5) dark chocolate- reduces inflammation 6) turmeric- reduces inflammation 7) tart cherries - reduce  pain and stiffness 8) extra virgin olive oil - its compound olecanthal helps to block prostaglandins  9) chili peppers- can be eaten or applied topically via capsaicin  10) mint- helpful for headache, muscle aches, joint pain, and itching 11) garlic- reduces inflammation 12) Green tea- reduces inflammation and oxidative stress, helps with weight loss, may reduce the risk of cancer, recommend Double [employer] of Tea daily  -Discussed Qutenza  as an option for neuropathic pain control. Discussed that this is a capsaicin  patch, stronger than capsaicin  cream. Discussed that it is currently approved for diabetic peripheral neuropathy and post-herpetic neuralgia, but that it has also shown benefit in treating other forms of neuropathy. Provided patient with link to site to learn more about the patch: https://www.qutenza .com/. Discussed that the patch would be placed in office and benefits usually last 3 months. Discussed that unintended exposure to capsaicin  can cause severe irritation of eyes, mucous membranes, respiratory tract, and skin, but that Qutenza  is a local treatment and does not have the systemic side effects of other nerve medications. Discussed that there may be pain, itching, erythema, and decreased sensory function associated with the application of Qutenza . Side effects usually subside within 1 week. A cold pack of analgesic medications can help with these side effects. Blood pressure can also be increased due to pain associated with administration of the patch.   1 patches of Qutenza  (35359) was applied to the right shoulder. Ice packs were applied during the procedure to ensure patient comfort. Blood pressure was monitored every 15 minutes. The patient tolerated the procedure well. Post-procedure instructions were given and follow-up has been scheduled.  Topical system measures 14cm x20cm (280cm for a total 1120units) were applied which will cause deeper penetration for destruction of  the peripheral nerve using a chemical (Qutenza ) which infuses into the skin like an injection and heat technique (occlusive, compressive dressing cauing endothermic heat technique)    Discussed extracorporeal shockwave therapy as a modality for treatment. Discussed that the device looks and feels like a massage gun and I would move it over the area of pain for about 10 minutes. The device releases sound waves to the area of pain and helps to improve blood flow and circulation to improve the healing process. Discuss that this initially induces inflammation and can sometimes cause short-term increase in pain. Discussed that we typically do three weekly treatments, but sometimes up to 6 if needed, and after 6 weeks long term benefits can sometimes be achieved. Discussed that this is an FDA approved device, but not covered by insurance and would cost $60 per session. Will scheduled patient for 6 consecutive appointments and can cancel latter three if benefits are achieved after first three sessions.       Link to further information on diet for chronic pain: http://www.bray.com/   2) Carpal tunnel syndrome: -continue orthotic  -discussed that we could try Qutenza  here in the future but it is riskier as patient's accidentally touch their eyes with their hands  3) Cubital tunnel syndrome: -continue PT and OT  -Discussed Qutenza  as an option for neuropathic pain control. Discussed that this is a capsaicin  patch, stronger than capsaicin  cream. Discussed that it is currently approved for diabetic peripheral neuropathy and post-herpetic neuralgia, but that it has also shown benefit in treating  other forms of neuropathy. Provided patient with link to site to learn more about the patch: https://www.qutenza .com/. Discussed that the patch would be placed in office and benefits usually last 3 months. Discussed that unintended exposure to  capsaicin  can cause severe irritation of eyes, mucous membranes, respiratory tract, and skin, but that Qutenza  is a local treatment and does not have the systemic side effects of other nerve medications. Discussed that there may be pain, itching, erythema, and decreased sensory function associated with the application of Qutenza . Side effects usually subside within 1 week. A cold pack of analgesic medications can help with these side effects. Blood pressure can also be increased due to pain associated with administration of the patch.   2 patches of Qutenza  was applied to the right elbow. Ice packs were applied during the procedure to ensure patient comfort. Blood pressure was monitored every 15 minutes. The patient tolerated the procedure well. Post-procedure instructions were given and follow-up has been scheduled.  Topical system measures 14cm x20cm (280cm for a total 1120units) were applied which will cause deeper penetration for destruction of the peripheral nerve using a chemical (Qutenza ) which infuses into the skin like an injection and heat technique (occlusive, compressive dressing cauing endothermic heat technique)        4) Insomnia: -Try to go outside near sunrise -Get exercise during the day.  -Turn off all devices an hour before bedtime.  -Teas that can benefit: chamomile, valerian root, Brahmi (Bacopa) -Can consider over the counter melatonin, magnesium , and/or L-theanine. Melatonin is an anti-oxidant with multiple health benefits. Magnesium  is involved in greater than 300 enzymatic reactions in the body and most of us  are deficient as our soil is often depleted. There are 7 different types of magnesium - Bioptemizer's is a supplement with all 7 types, and each has unique benefits. Magnesium  can also help with constipation and anxiety.  -Pistachios naturally increase the production of melatonin -Cozy Earth bamboo bed sheets are free from toxic chemicals.  -Tart cherry juice or a tart  cherry supplement can improve sleep and soreness post-workout

## 2024-03-26 ENCOUNTER — Other Ambulatory Visit: Payer: Self-pay

## 2024-03-26 ENCOUNTER — Other Ambulatory Visit (HOSPITAL_COMMUNITY): Payer: Self-pay

## 2024-04-01 ENCOUNTER — Ambulatory Visit: Payer: Self-pay

## 2024-04-01 ENCOUNTER — Other Ambulatory Visit (HOSPITAL_COMMUNITY): Payer: Self-pay

## 2024-04-01 ENCOUNTER — Telehealth: Payer: Self-pay

## 2024-04-01 ENCOUNTER — Other Ambulatory Visit: Payer: Self-pay

## 2024-04-01 ENCOUNTER — Encounter: Payer: Self-pay | Admitting: Internal Medicine

## 2024-04-01 DIAGNOSIS — R6889 Other general symptoms and signs: Secondary | ICD-10-CM

## 2024-04-01 MED ORDER — ALBUTEROL SULFATE HFA 108 (90 BASE) MCG/ACT IN AERS
2.0000 | INHALATION_SPRAY | Freq: Four times a day (QID) | RESPIRATORY_TRACT | 0 refills | Status: DC | PRN
  Filled 2024-04-01: qty 6.7, 20d supply, fill #0

## 2024-04-01 MED ORDER — ALBUTEROL SULFATE HFA 108 (90 BASE) MCG/ACT IN AERS: 2.0000 | INHALATION_SPRAY | Freq: Four times a day (QID) | RESPIRATORY_TRACT | 0 refills | Status: AC | PRN

## 2024-04-01 MED ORDER — PROMETHAZINE-PHENYLEPHRINE 6.25-5 MG/5ML PO SYRP: 5.0000 mL | ORAL_SOLUTION | Freq: Three times a day (TID) | ORAL | 0 refills | Status: AC | PRN

## 2024-04-01 MED ORDER — PREDNISONE 10 MG (21) PO TBPK
ORAL_TABLET | ORAL | 0 refills | Status: DC
  Filled 2024-04-01: qty 21, 6d supply, fill #0

## 2024-04-01 MED ORDER — PROMETHAZINE-PHENYLEPHRINE 6.25-5 MG/5ML PO SYRP
5.0000 mL | ORAL_SOLUTION | Freq: Three times a day (TID) | ORAL | 0 refills | Status: DC | PRN
Start: 2024-04-01 — End: 2024-04-01
  Filled 2024-04-01: qty 280, 19d supply, fill #0

## 2024-04-01 MED ORDER — PREDNISONE 10 MG (21) PO TBPK: ORAL_TABLET | ORAL | 0 refills | Status: DC

## 2024-04-01 NOTE — Telephone Encounter (Signed)
 Pt is aware that medications have been sent to pharmacy. Pt requested that we resend to [facility] in [place]. Medications have been resent.

## 2024-04-01 NOTE — Telephone Encounter (Signed)
 Also see triage note. Pt refused to go the ED per triage nurse recommendation.

## 2024-04-01 NOTE — Telephone Encounter (Signed)
 Rx's have been re-sent to correct pharmacy.

## 2024-04-01 NOTE — Addendum Note (Signed)
 Addended by: MARYLYNN VERNEITA CROME on: 04/01/2024 05:28 PM   Modules accepted: Orders

## 2024-04-01 NOTE — Telephone Encounter (Signed)
 Pt has refused ED. She is positive covid. Having SOBr and coughing. Pt would like to have an inhaler and cough medication sent to pharmacy.

## 2024-04-01 NOTE — Telephone Encounter (Signed)
 Positive Covid test (Newest Message First)            [name] Borer to P [facility] (supporting [name] Kettering, MD) (Selected Message)     04/01/24  8:03 AM I tested positive today. Symptoms started with scratchy throat Friday but was gone by midday. Woke up early Saturday morning feeling rough. I had chills, sore throat, slight cough, head congestion, dyspnea with minimal activity, no appetite, severe ear pressure and pain, negative covid test though. I assumed I had flu or head cold. Sunday I felt slightly better. Today I have a nasty cough with blood tinged phlegm, dyspnea, no appetite, ear pain and pressure, headache, stuffy nose but runny at times. I'm calling work so someone can bring my laptop to me outside and I can come back home. I need a new inhaler and something for my cough because I am severely fatigued and dyspneic. My test was immediately positive. I have attached a picture of my test.  Attachments  [id number]

## 2024-04-01 NOTE — Telephone Encounter (Signed)
 FYI Only or Action Required?: FYI only for provider.  Patient was last seen in primary care on 02/27/2024 by Marylynn Verneita CROME, MD.  Called Nurse Triage reporting Shortness of Breath.  Symptoms began several days ago.  Interventions attempted: Nothing.  Symptoms are: gradually worsening.  Triage Disposition: Go to ED Now (Notify PCP)  Patient/caregiver understands and will follow disposition?: No, refuses disposition Reason for Disposition  [1] MODERATE difficulty breathing (e.g., speaks in phrases, SOB even at rest, pulse 100-120) AND [2] NEW-onset or WORSE than normal  Answer Assessment - Initial Assessment Questions Patient can barely speak, sates she needs an inhaler and cough syrup. Tested positive Covid today. Patient speaking in phrases due to trouble breathing, advised ED, patient declined ED, stated she just needs an inhaler. Advised patient PCP is actively working with patients and I cannot guarantee when she'll be able to send something through. Patient stated she will go to ED if things get progressively worse.   1. RESPIRATORY STATUS: Describe your breathing? (e.g., wheezing, shortness of breath, unable to speak, severe coughing)      Unable to speak, SOB, can barely walk from bed to bathroom  2. ONSET: When did this breathing problem begin?      Saturday chest tightness, woke up this morning with blood tinged sputum.  3. PATTERN Does the difficult breathing come and go, or has it been constant since it started?      Constant today   4. SEVERITY: How bad is your breathing? (e.g., mild, moderate, severe)      Severe  5. RECURRENT SYMPTOM: Have you had difficulty breathing before? If Yes, ask: When was the last time? and What happened that time?      Had Covid pneumonia previously.   6. LUNG HISTORY: Do you have any history of lung disease?  (e.g., pulmonary embolus, asthma, emphysema)     Asthma   7. CAUSE: What do you think is causing the breathing  problem?      Covid / sickness  8. OTHER SYMPTOMS: Do you have any other symptoms? (e.g., chest pain, cough, dizziness, fever, runny nose)     Productive cough, yellow with blood tinged sputum, chest tightness, head cold feeling  9. O2 SATURATION MONITOR:  Do you use an oxygen saturation monitor (pulse oximeter) at home? If Yes, ask: What is your reading (oxygen level) today? What is your usual oxygen saturation reading? (e.g., 95%)     93% earlier,  97% HR 105  Protocols used: Breathing Difficulty-A-AH  Copied from CRM #[id number]. Topic: Clinical - Red Word Triage >> Apr 01, 2024  1:40 PM Victoria A wrote: [facility] that prompted transfer to Nurse Triage: Patient tested positive for Covid and has severe shortness of breath

## 2024-04-02 ENCOUNTER — Other Ambulatory Visit: Payer: Self-pay

## 2024-04-08 ENCOUNTER — Encounter: Payer: Self-pay | Admitting: Radiology

## 2024-04-09 ENCOUNTER — Other Ambulatory Visit (HOSPITAL_COMMUNITY): Payer: Self-pay

## 2024-04-12 ENCOUNTER — Other Ambulatory Visit (HOSPITAL_COMMUNITY): Payer: Self-pay

## 2024-04-22 ENCOUNTER — Encounter (HOSPITAL_BASED_OUTPATIENT_CLINIC_OR_DEPARTMENT_OTHER): Payer: Self-pay | Admitting: Orthopaedic Surgery

## 2024-04-22 NOTE — Telephone Encounter (Signed)
Note completed and sent to mychart.

## 2024-04-24 ENCOUNTER — Other Ambulatory Visit (HOSPITAL_COMMUNITY): Payer: Self-pay

## 2024-04-24 ENCOUNTER — Other Ambulatory Visit: Payer: Self-pay | Admitting: Internal Medicine

## 2024-04-24 DIAGNOSIS — I10 Essential (primary) hypertension: Secondary | ICD-10-CM

## 2024-04-24 DIAGNOSIS — E119 Type 2 diabetes mellitus without complications: Secondary | ICD-10-CM | POA: Diagnosis not present

## 2024-04-24 LAB — OPHTHALMOLOGY REPORT-SCANNED

## 2024-04-26 ENCOUNTER — Other Ambulatory Visit (HOSPITAL_COMMUNITY): Payer: Self-pay

## 2024-04-26 ENCOUNTER — Other Ambulatory Visit: Payer: Self-pay

## 2024-04-26 MED ORDER — METOPROLOL SUCCINATE ER 25 MG PO TB24
12.5000 mg | ORAL_TABLET | Freq: Every day | ORAL | 0 refills | Status: DC
  Filled 2024-04-26: qty 15, 30d supply, fill #0

## 2024-05-03 ENCOUNTER — Other Ambulatory Visit (HOSPITAL_COMMUNITY): Payer: Self-pay

## 2024-05-13 ENCOUNTER — Telehealth: Payer: Self-pay

## 2024-05-13 ENCOUNTER — Other Ambulatory Visit (HOSPITAL_COMMUNITY): Payer: Self-pay

## 2024-05-13 NOTE — Telephone Encounter (Signed)
 Pharmacy Patient Advocate Encounter  Received notification from [facility] that Prior Authorization for Qulipta  60MG  tablets  has been APPROVED from 05/13/2024 to 05/12/2025. Unable to obtain price due to refill too soon rejection, last fill date 05/03/2024 next available fill date12/22/2025   PA #/Case ID/Reference #: [id number]

## 2024-05-13 NOTE — Telephone Encounter (Signed)
 Pharmacy Patient Advocate Encounter   Received notification from CoverMyMeds that prior authorization for Qulipta  is required/requested.   Insurance verification completed.   The patient is insured through [facility].   Per test claim: PA required; PA submitted to above mentioned insurance via Latent Key/confirmation #/EOC [id number] Status is pending

## 2024-05-20 ENCOUNTER — Other Ambulatory Visit: Payer: Self-pay | Admitting: Internal Medicine

## 2024-05-20 DIAGNOSIS — I10 Essential (primary) hypertension: Secondary | ICD-10-CM

## 2024-05-21 ENCOUNTER — Other Ambulatory Visit: Payer: Self-pay

## 2024-05-21 ENCOUNTER — Other Ambulatory Visit (HOSPITAL_COMMUNITY): Payer: Self-pay

## 2024-05-22 ENCOUNTER — Other Ambulatory Visit (HOSPITAL_COMMUNITY): Payer: Self-pay

## 2024-05-22 ENCOUNTER — Other Ambulatory Visit: Payer: Self-pay

## 2024-05-22 MED ORDER — METOPROLOL SUCCINATE ER 25 MG PO TB24
12.5000 mg | ORAL_TABLET | Freq: Every day | ORAL | 1 refills | Status: AC
  Filled 2024-05-22: qty 15, 30d supply, fill #0

## 2024-05-30 ENCOUNTER — Other Ambulatory Visit (HOSPITAL_COMMUNITY): Payer: Self-pay

## 2024-06-10 ENCOUNTER — Other Ambulatory Visit: Payer: Self-pay | Admitting: Obstetrics & Gynecology

## 2024-06-10 ENCOUNTER — Encounter: Payer: Self-pay | Admitting: Internal Medicine

## 2024-06-10 ENCOUNTER — Ambulatory Visit: Admitting: Internal Medicine

## 2024-06-10 ENCOUNTER — Other Ambulatory Visit: Payer: Self-pay

## 2024-06-10 VITALS — BP 130/82 | HR 106 | Ht 66.0 in | Wt 192.0 lb

## 2024-06-10 DIAGNOSIS — Z1231 Encounter for screening mammogram for malignant neoplasm of breast: Secondary | ICD-10-CM

## 2024-06-10 DIAGNOSIS — E1165 Type 2 diabetes mellitus with hyperglycemia: Secondary | ICD-10-CM

## 2024-06-10 DIAGNOSIS — E1129 Type 2 diabetes mellitus with other diabetic kidney complication: Secondary | ICD-10-CM

## 2024-06-10 DIAGNOSIS — S46011S Strain of muscle(s) and tendon(s) of the rotator cuff of right shoulder, sequela: Secondary | ICD-10-CM | POA: Diagnosis not present

## 2024-06-10 DIAGNOSIS — E785 Hyperlipidemia, unspecified: Secondary | ICD-10-CM

## 2024-06-10 DIAGNOSIS — E042 Nontoxic multinodular goiter: Secondary | ICD-10-CM

## 2024-06-10 DIAGNOSIS — R809 Proteinuria, unspecified: Secondary | ICD-10-CM

## 2024-06-10 DIAGNOSIS — E1169 Type 2 diabetes mellitus with other specified complication: Secondary | ICD-10-CM | POA: Diagnosis not present

## 2024-06-10 DIAGNOSIS — Z79899 Other long term (current) drug therapy: Secondary | ICD-10-CM

## 2024-06-10 DIAGNOSIS — K760 Fatty (change of) liver, not elsewhere classified: Secondary | ICD-10-CM

## 2024-06-10 DIAGNOSIS — I10 Essential (primary) hypertension: Secondary | ICD-10-CM | POA: Diagnosis not present

## 2024-06-10 DIAGNOSIS — Z794 Long term (current) use of insulin: Secondary | ICD-10-CM | POA: Diagnosis not present

## 2024-06-10 LAB — MICROALBUMIN / CREATININE URINE RATIO
Creatinine,U: 267.4 mg/dL
Microalb Creat Ratio: 39.9 mg/g — ABNORMAL HIGH (ref 0.0–30.0)
Microalb, Ur: 10.7 mg/dL — ABNORMAL HIGH (ref 0.7–1.9)

## 2024-06-10 MED ORDER — LUBIPROSTONE 8 MCG PO CAPS
8.0000 ug | ORAL_CAPSULE | Freq: Two times a day (BID) | ORAL | 0 refills | Status: AC
  Filled 2024-06-10: qty 60, 30d supply, fill #0

## 2024-06-10 NOTE — Patient Instructions (Addendum)
 LAXATIVES THAT ARE OK TO USE DAILY:    COLACE (DOCUSATE) UP TO 200 MG NIGHTLY  BENEFIBER ,  FIBERCON,  CITRUCEL OR METAMUCIL   USE EX LAX AND UDLOCAX NOT MORE THAN 2/WEEK    USE MIRALAX  NOT MORE THAN EVERY OTHER DAY     AMITIZA : FOR IBS CONSTIPATION   START WITH ONE TABLET DAILY,  MAY INCREASE TO 2 TABLETS DAILY

## 2024-06-10 NOTE — Progress Notes (Signed)
 "  Subjective:  Patient ID: [name], female    DOB: [birth year]  Age: 54 y.o. MRN: [id number]  CC: The primary encounter diagnosis was Type 2 diabetes mellitus with hyperglycemia, with long-term current use of insulin  (HCC). Diagnoses of Essential hypertension, Hyperlipidemia associated with type 2 diabetes mellitus (HCC), Long-term use of high-risk medication, Microalbuminuria due to type 2 diabetes mellitus (HCC), Traumatic incomplete tear of right rotator cuff, sequela, Fatty infiltration of liver, and Multinodular goiter were also pertinent to this visit.   HPI [name] presents for  Chief Complaint  Patient presents with   Medical Management of Chronic Issues    3 month follow up    Follow up on constipation:  HAS BEEN alternating between regular stooling and constipation.  Uses ex lax prn. Not attributed to use of Mounjaro .  Reviewed options of treatment for IBS.  Trial of low dose Amitiza  discussed   Fatty liver   LFTS have been normal.  She is tolerating Mouunjaro and has lost 10lbs since July  Chronic shoulder pain:  had surgery in July on right shoulder,  stll having pain aggravated by PT .  Using meloxicam  sparingsly   T2DM:  seeing Dr Trixie.  A1c has been < 6.5 , follow up next month . Losing weight on Mounjaro    MNG;  has not had ultrasound sinc e 2019.  Last TSH  1 yr ago.      Hypertension: patient checks blood pressure twice weekly at home.  Readings have been for the most part <130/80 at rest . Patient is following a reduced salt diet most days and is taking medications as prescribed  (losartan  100 mg .  12.5 mg metoprolol  for concurrent SVT)     Outpatient Medications Prior to Visit  Medication Sig Dispense Refill   albuterol  (VENTOLIN  HFA) 108 (90 Base) MCG/ACT inhaler Inhale 2 puffs into the lungs every 6 (six) hours as needed for wheezing or shortness of breath. 18 g 0   aspirin  EC 81 MG tablet Take 81 mg by mouth daily. Swallow whole.      Atogepant  (QULIPTA ) 60 MG TABS Take 1 tablet (60 mg total) by mouth daily. 30 tablet 11   atorvastatin  (LIPITOR) 20 MG tablet Take 1 tablet (20 mg total) by mouth daily. 90 tablet 0   Blood Glucose Monitoring Suppl (ACCU-CHEK GUIDE) w/Device KIT Use to check blood sugar 3 times a day. 1 kit 0   butalbital -acetaminophen -caffeine  (FIORICET ) 50-325-40 MG tablet Take 1 tablet by mouth every 6 (six) hours as needed for headache. Do not refill in less than 30 day 30 tablet 5   Continuous Glucose Sensor (FREESTYLE LIBRE 3 PLUS SENSOR) MISC Change every 14 (fourteen) days as directed. 6 each 3   diazepam  (VALIUM ) 5 MG tablet Take 1 tablet (5 mg total) by mouth every 12 (twelve) hours as needed for anxiety. 30 tablet 5   dicyclomine  (BENTYL ) 20 MG tablet Take 1 tablet (20 mg total) by mouth every 6 (six) hours. 120 tablet 1   EPINEPHrine  0.3 mg/0.3 mL IJ SOAJ injection Inject 0.3 mg into the muscle as needed for anaphylaxis. 2 each 0   glucose blood (ACCU-CHEK GUIDE TEST) test strip Use to check blood sugar 3 times a day. 300 each 12   Lancets (FREESTYLE) lancets Use to check blood sugar 3 times a day. 300 each 12   losartan  (COZAAR ) 100 MG tablet Take 1 tablet (100 mg total) by mouth daily. 90 tablet 1  meloxicam  (MOBIC ) 15 MG tablet Take 1 tablet (15 mg total) by mouth daily. 30 tablet 3   metFORMIN  (GLUCOPHAGE -XR) 500 MG 24 hr tablet Take 1 tablet (500 mg total) by mouth daily. 90 tablet 3   metoprolol  succinate (TOPROL -XL) 25 MG 24 hr tablet Take 1/2 tablet (12.5 mg total) by mouth daily. 15 tablet 1   ondansetron  (ZOFRAN -ODT) 4 MG disintegrating tablet Take 1 tablet (4 mg total) by mouth every 8 (eight) hours as needed for nausea or vomiting. 20 tablet 0   pantoprazole  (PROTONIX ) 40 MG tablet Take 1 tablet (40 mg total) by mouth daily. (Patient taking differently: Take 40 mg by mouth as needed.) 30 tablet 3   pregabalin  (LYRICA ) 25 MG capsule Take 1 capsule (25 mg total) by mouth at bedtime. 90 capsule  3   promethazine -phenylephrine  (PROMETHAZINE  VC) 6.25-5 MG/5ML SYRP Take 5 mLs by mouth every 8 (eight) hours as needed for congestion. 280 mL 0   tirzepatide  (MOUNJARO ) 5 MG/0.5ML Pen Inject 5 mg into the skin once a week. 6 mL 1   Ubrogepant  (UBRELVY ) 100 MG TABS Take 1 tablet (100 mg total) by mouth as needed. May repeat dose in 2 hours if needed. Max dose 2 tablets in 2 hours 16 tablet 11   Vitamin D , Ergocalciferol , (DRISDOL ) 1.25 MG (50000 UNIT) CAPS capsule Take 1 capsule by mouth once a week. 4 capsule 11   predniSONE  (STERAPRED UNI-PAK 21 TAB) 10 MG (21) TBPK tablet 6 tablets on Day 1 , then reduce by 1 tablet daily until gone 21 tablet 0   No facility-administered medications prior to visit.    Review of Systems;  Patient denies headache, fevers, malaise, unintentional weight loss, skin rash, eye pain, sinus congestion and sinus pain, sore throat, dysphagia,  hemoptysis , cough, dyspnea, wheezing, chest pain, palpitations, orthopnea, edema, abdominal pain, nausea, melena, diarrhea, constipation, flank pain, dysuria, hematuria, urinary  Frequency, nocturia, numbness, tingling, seizures,  Focal weakness, Loss of consciousness,  Tremor, insomnia, depression, anxiety, and suicidal ideation.      Objective:  BP 130/82   Pulse (!) 106   Ht 5' 6 (1.676 m)   Wt 192 lb (87.1 kg)   SpO2 98%   BMI 30.99 kg/m   BP Readings from Last 3 Encounters:  06/10/24 130/82  03/19/24 (!) 143/89  03/11/24 120/70    Wt Readings from Last 3 Encounters:  06/10/24 192 lb (87.1 kg)  03/19/24 199 lb (90.3 kg)  03/11/24 200 lb 3.2 oz (90.8 kg)    Physical Exam  Lab Results  Component Value Date   HGBA1C 6.2 (A) 03/11/2024   HGBA1C 6.4 (A) 11/28/2023   HGBA1C 6.5 (A) 07/28/2023    Lab Results  Component Value Date   CREATININE 0.86 12/21/2023   CREATININE 0.77 10/09/2023   CREATININE 0.70 03/29/2023    Lab Results  Component Value Date   WBC 6.7 03/29/2023   HGB 12.6 03/29/2023    HCT 37.6 03/29/2023   PLT 203 03/29/2023   GLUCOSE 112 (H) 12/21/2023   CHOL 125 10/09/2023   TRIG 55 10/09/2023   HDL 43 (L) 10/09/2023   LDLDIRECT 88.0 09/21/2021   LDLCALC 68 10/09/2023   ALT 12 10/09/2023   AST 12 10/09/2023   NA 141 12/21/2023   K 4.2 12/21/2023   CL 107 12/21/2023   CREATININE 0.86 12/21/2023   BUN 11 12/21/2023   CO2 25 12/21/2023   TSH 0.48 09/21/2021   HGBA1C 6.2 (A) 03/11/2024   MICROALBUR  10.7 (H) 06/10/2024    No results found.  Assessment & Plan:  .Type 2 diabetes mellitus with hyperglycemia, with long-term current use of insulin  (HCC) -     Microalbumin / creatinine urine ratio -     CBC with Differential/Platelet; Future -     Comprehensive metabolic panel with GFR; Future -     Hemoglobin A1c; Future  Essential hypertension  Hyperlipidemia associated with type 2 diabetes mellitus (HCC) Assessment & Plan: Currently managed by Endocrinology with Mounjaro  and metformin . XR . Patient has treated  microalbuminuria.but uacr remains > 30 espite max dose losartan .  Will rec SGLT2 inhibirot given mild CKD as well.   Patient is tolerating statin therapy for CAD risk reduction and on ACE/ARB for renal protection and hypertension, and LDL is < 70  on current atorvastatin  dose.   Lab Results  Component Value Date   HGBA1C 6.2 (A) 03/11/2024   Lab Results  Component Value Date   CREATININE 0.86 12/21/2023   Lab Results  Component Value Date   CHOL 125 10/09/2023   HDL 43 (L) 10/09/2023   LDLCALC 68 10/09/2023   LDLDIRECT 88.0 09/21/2021   TRIG 55 10/09/2023   CHOLHDL 2.9 10/09/2023      Long-term use of high-risk medication  Microalbuminuria due to type 2 diabetes mellitus (HCC) Assessment & Plan: Uacr > 30 despite max dose of losartan .  Recommending  trial of SGLT 2 inhibitor   Lab Results  Component Value Date   LABMICR 24.7 07/06/2017   MICROALBUR 10.7 (H) 06/10/2024   MICROALBUR 14.1 (H) 10/09/2023          Traumatic  incomplete tear of right rotator cuff, sequela Assessment & Plan: She is S/p Arthroscopic extensive debridement - of Subdeltoid Bursa, Supraspinatus Tendon, Anterior Labrum, and Arthroscopic biceps tenodesis  July 2025 followed by PT    Fatty infiltration of liver Assessment & Plan: Suggested by ultrasound.done in 2022.  Fibrosis was not excluded  Continue statin , GLP 1 agonist and weight loss.  Labs needed to calculate risk for fibrosis.   Lab Results  Component Value Date   ALT 12 10/09/2023   AST 12 10/09/2023   ALKPHOS 131 (H) 10/09/2023   BILITOT 0.4 10/09/2023      Multinodular goiter Assessment & Plan: By ultrasound last done in 2019.  Follow up is needed on several nodules   Lab Results  Component Value Date   TSH 0.48 09/21/2021     Orders: -     US  THYROID ; Future -     TSH; Future  Other orders -     Lubiprostone ; Take 1 capsule (8 mcg total) by mouth 2 (two) times daily with a meal.  Dispense: 60 capsule; Refill: 0     I spent 34 minutes on the day of this face to face encounter reviewing patient's  most recent visit with orthopedics  endocrinology,  and neurology,  prior relevant surgical and non surgical procedures, recent  labs and imaging studies, counseling on constipation  management,  reviewing the assessment and plan with patient, and post visit ordering and reviewing of  diagnostics and therapeutics with patient  .   Follow-up: No follow-ups on file.   Verneita LITTIE Kettering, MD "

## 2024-06-11 ENCOUNTER — Encounter: Payer: Self-pay | Admitting: Internal Medicine

## 2024-06-11 ENCOUNTER — Encounter: Payer: Self-pay | Admitting: Gastroenterology

## 2024-06-11 ENCOUNTER — Other Ambulatory Visit (HOSPITAL_COMMUNITY): Payer: Self-pay

## 2024-06-11 ENCOUNTER — Encounter (HOSPITAL_BASED_OUTPATIENT_CLINIC_OR_DEPARTMENT_OTHER): Payer: Self-pay | Admitting: Orthopaedic Surgery

## 2024-06-11 DIAGNOSIS — E1129 Type 2 diabetes mellitus with other diabetic kidney complication: Secondary | ICD-10-CM

## 2024-06-11 NOTE — Assessment & Plan Note (Signed)
 Currently managed by Endocrinology with Mounjaro  and metformin . XR . Patient has treated  microalbuminuria.but uacr remains > 30 espite max dose losartan .  Will rec SGLT2 inhibirot given mild CKD as well.   Patient is tolerating statin therapy for CAD risk reduction and on ACE/ARB for renal protection and hypertension, and LDL is < 70  on current atorvastatin  dose.   Lab Results  Component Value Date   HGBA1C 6.2 (A) 03/11/2024   Lab Results  Component Value Date   CREATININE 0.86 12/21/2023   Lab Results  Component Value Date   CHOL 125 10/09/2023   HDL 43 (L) 10/09/2023   LDLCALC 68 10/09/2023   LDLDIRECT 88.0 09/21/2021   TRIG 55 10/09/2023   CHOLHDL 2.9 10/09/2023

## 2024-06-11 NOTE — Assessment & Plan Note (Signed)
 She is S/p Arthroscopic extensive debridement - of Subdeltoid Bursa, Supraspinatus Tendon, Anterior Labrum, and Arthroscopic biceps tenodesis  July 2025 followed by PT

## 2024-06-11 NOTE — Assessment & Plan Note (Signed)
 Uacr > 30 despite max dose of losartan .  Recommending sglt2 inhibitor

## 2024-06-11 NOTE — Assessment & Plan Note (Signed)
 Uacr > 30 despite max dose of losartan .  Recommending  trial of SGLT 2 inhibitor   Lab Results  Component Value Date   LABMICR 24.7 07/06/2017   MICROALBUR 10.7 (H) 06/10/2024   MICROALBUR 14.1 (H) 10/09/2023

## 2024-06-11 NOTE — Assessment & Plan Note (Addendum)
 Suggested by ultrasound.done in 2022.  Fibrosis was not excluded  Continue statin , GLP 1 agonist and weight loss.  Labs needed to calculate risk for fibrosis.   Lab Results  Component Value Date   ALT 12 10/09/2023   AST 12 10/09/2023   ALKPHOS 131 (H) 10/09/2023   BILITOT 0.4 10/09/2023

## 2024-06-11 NOTE — Assessment & Plan Note (Signed)
 By ultrasound last done in 2019.  Follow up is needed on several nodules   Lab Results  Component Value Date   TSH 0.48 09/21/2021

## 2024-06-12 ENCOUNTER — Other Ambulatory Visit: Payer: Self-pay

## 2024-06-19 DIAGNOSIS — Z0289 Encounter for other administrative examinations: Secondary | ICD-10-CM

## 2024-06-21 ENCOUNTER — Ambulatory Visit (HOSPITAL_COMMUNITY)
Admission: RE | Admit: 2024-06-21 | Discharge: 2024-06-21 | Disposition: A | Discharge status: Home / Self Care | Source: Ambulatory Visit | Attending: Internal Medicine | Admitting: Internal Medicine

## 2024-06-21 ENCOUNTER — Encounter: Admitting: Physical Medicine and Rehabilitation

## 2024-06-21 DIAGNOSIS — E042 Nontoxic multinodular goiter: Secondary | ICD-10-CM | POA: Diagnosis present

## 2024-06-27 ENCOUNTER — Ambulatory Visit
Admission: RE | Admit: 2024-06-27 | Discharge: 2024-06-27 | Disposition: A | Discharge status: Home / Self Care | Source: Ambulatory Visit | Attending: Obstetrics & Gynecology | Admitting: Obstetrics & Gynecology

## 2024-06-27 ENCOUNTER — Telehealth: Payer: Self-pay | Admitting: *Deleted

## 2024-06-27 ENCOUNTER — Telehealth: Payer: Self-pay

## 2024-06-27 DIAGNOSIS — Z1231 Encounter for screening mammogram for malignant neoplasm of breast: Secondary | ICD-10-CM

## 2024-06-27 NOTE — Telephone Encounter (Signed)
 [employer] form faxed on 06/27/2024

## 2024-06-27 NOTE — Telephone Encounter (Signed)
 Migraine fmla completed and ready for pick up by medical records

## 2024-07-03 ENCOUNTER — Other Ambulatory Visit: Payer: Self-pay

## 2024-07-03 ENCOUNTER — Ambulatory Visit: Admission: EM | Admit: 2024-07-03 | Discharge: 2024-07-03 | Disposition: A | Discharge status: Home / Self Care

## 2024-07-03 ENCOUNTER — Other Ambulatory Visit (HOSPITAL_COMMUNITY): Payer: Self-pay

## 2024-07-03 ENCOUNTER — Ambulatory Visit: Attending: Internal Medicine | Admitting: Internal Medicine

## 2024-07-03 ENCOUNTER — Other Ambulatory Visit (HOSPITAL_BASED_OUTPATIENT_CLINIC_OR_DEPARTMENT_OTHER): Payer: Self-pay

## 2024-07-03 ENCOUNTER — Encounter: Payer: Self-pay | Admitting: Internal Medicine

## 2024-07-03 VITALS — BP 132/88 | HR 70 | Ht 66.0 in | Wt 193.0 lb

## 2024-07-03 DIAGNOSIS — I471 Supraventricular tachycardia, unspecified: Secondary | ICD-10-CM

## 2024-07-03 DIAGNOSIS — I10 Essential (primary) hypertension: Secondary | ICD-10-CM | POA: Diagnosis not present

## 2024-07-03 DIAGNOSIS — E785 Hyperlipidemia, unspecified: Secondary | ICD-10-CM | POA: Diagnosis not present

## 2024-07-03 DIAGNOSIS — R0789 Other chest pain: Secondary | ICD-10-CM | POA: Diagnosis not present

## 2024-07-03 DIAGNOSIS — E1169 Type 2 diabetes mellitus with other specified complication: Secondary | ICD-10-CM | POA: Diagnosis not present

## 2024-07-03 NOTE — Patient Instructions (Signed)

## 2024-07-03 NOTE — Progress Notes (Signed)
 " Cardiology Office Note:  .   Date:  07/03/2024  ID:  [name], DOB Aug 08, 1970, MRN [id number] PCP: Stephanie Verneita CROME, MD  [facility] Providers Cardiologist:  Stephanie Hanson, MD     History of Present Illness: [name] is a 54 y.o. 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  I last saw her in 04/2023, at which time she was recovering from right rotator cuff surgery.  From a heart standpoint, she had been doing well with only rare palpitations and atypical chest pain.  We did not make any medication changes or pursue further testing at that time.  Today comes [name] reports that she has been feeling fairly well.  She notes only rare episodes of chest pain happening 2-3 times a year.  Interestingly, she had an episode last night that occurred while she was lying in bed, studying for a test.  She describes the discomfort as pressure in her upper chest that resolved spontaneously within a few minutes.  She has not had any exertional chest pain or shortness of breath.  There were no associated symptoms.  [name] reports minimal palpitations, usually only when she is stressed.  She has not had any lightheadedness or edema.  Blood pressures at work have generally been well-controlled with systolic readings between 120 and 100 30 mmHg.  [name] notes that [name] discontinued HCTZ and increased losartan  at their last visit due to proteinuria.  ROS: See HPI  Studies Reviewed: [name]   EKG Interpretation Date/Time:  Wednesday July 03 2024 09:33:44 EST Ventricular Rate:  70 PR Interval:  162 QRS Duration:  90 QT Interval:  386 QTC Calculation: 416 R Axis:   -3  Text Interpretation: Sinus rhythm with Premature atrial complexes Nonspecific T wave abnormality Abnormal ECG When compared with  ECG of 29-Mar-2023 15:55, Premature atrial complexes are now Present Nonspecific T wave abnormality is now Present Confirmed by [name], Stephanie [phone]) on 07/03/2024 9:36:45 AM    EP Procedures and Devices: 14-day event monitor (01/12/2017): Predominantly sinus rhythm with rare PACs and PVCs. Brief atrial run lasting 4 beats.   Non-Invasive Evaluation(s): TTE (06/18/2019): Normal LV size with mild LVH.  LVEF 60-65% with grade 1 diastolic dysfunction.  Normal RV size and function.  Mild left atrial enlargement. Coronary CTA (03/23/2017): No evidence of coronary artery disease. Normal coronary origins with right dominance. Coronary artery calcium  score is zero. TTE (01/19/2017): Normal LV size with moderate LVH. LVEF 60-65% with grade 1 diastolic dysfunction. Normal RV size and function. Trivial pericardial effusion.  Risk Assessment/Calculations:             Physical Exam:   VS:  BP 132/88 (BP Location: Left Arm, Patient Position: Sitting, Cuff Size: Normal)   Pulse 70   Ht 5' 6 (1.676 m)   Wt 193 lb (87.5 kg)   SpO2 97%   BMI 31.15 kg/m    Wt Readings from Last 3 Encounters:  07/03/24 193 lb (87.5 kg)  06/10/24 192 lb (87.1 kg)  03/19/24 199 lb (90.3 kg)    General:  NAD. Neck: No JVD or HJR. Lungs: Clear to auscultation bilaterally without wheezes or crackles. Heart: Regular rate and rhythm without murmurs, rubs, or gallops. Abdomen: Soft, nontender, nondistended. Extremities: No lower extremity edema.  ASSESSMENT AND PLAN: .  Chest pain: Sporadic chest pain remains infrequent and not consistent with coronary insufficiency.  Coronary CTA in 2018 was reassuring without any plaque or stenosis.  We discussed the role for repeat ischemia evaluation but have agreed to defer this given the infrequent and atypical nature of her symptoms.  PSVT: Minimal palpitations noted, typically only with stressful situations.  We will continue low-dose metoprolol .  Hypertension: Blood pressure  borderline elevated today but typically better when checked at work.  Continue current regimen of losartan  and metoprolol  succinate.  Hyperlipidemia associated with type 2 diabetes mellitus: Lipids well-controlled on last check with LDL of 68 in 10/2023.  Continue current regimen of atorvastatin  20 mg daily.  Ongoing surveillance as well as management of DM per Dr. Tullo.     Dispo: Return to clinic in 1 year.  Signed, Stephanie Hanson, MD  "

## 2024-07-04 ENCOUNTER — Ambulatory Visit (HOSPITAL_COMMUNITY)
Admission: RE | Admit: 2024-07-04 | Discharge: 2024-07-04 | Disposition: A | Discharge status: Home / Self Care | Payer: Worker's Compensation | Source: Ambulatory Visit | Attending: Family Medicine | Admitting: Family Medicine

## 2024-07-04 ENCOUNTER — Other Ambulatory Visit (HOSPITAL_COMMUNITY): Payer: Self-pay | Admitting: Family Medicine

## 2024-07-04 ENCOUNTER — Encounter (HOSPITAL_COMMUNITY): Payer: Self-pay

## 2024-07-04 DIAGNOSIS — Z9181 History of falling: Secondary | ICD-10-CM | POA: Diagnosis present

## 2024-07-05 ENCOUNTER — Ambulatory Visit: Admitting: Gastroenterology

## 2024-07-05 ENCOUNTER — Encounter: Payer: Self-pay | Admitting: Gastroenterology

## 2024-07-05 VITALS — BP 130/82 | HR 75 | Ht 66.0 in | Wt 194.2 lb

## 2024-07-05 DIAGNOSIS — E1143 Type 2 diabetes mellitus with diabetic autonomic (poly)neuropathy: Secondary | ICD-10-CM

## 2024-07-05 DIAGNOSIS — R194 Change in bowel habit: Secondary | ICD-10-CM | POA: Insufficient documentation

## 2024-07-05 DIAGNOSIS — K3184 Gastroparesis: Secondary | ICD-10-CM | POA: Diagnosis not present

## 2024-07-05 DIAGNOSIS — T383X5A Adverse effect of insulin and oral hypoglycemic [antidiabetic] drugs, initial encounter: Secondary | ICD-10-CM | POA: Diagnosis not present

## 2024-07-05 DIAGNOSIS — Z7984 Long term (current) use of oral hypoglycemic drugs: Secondary | ICD-10-CM | POA: Diagnosis not present

## 2024-07-05 DIAGNOSIS — K219 Gastro-esophageal reflux disease without esophagitis: Secondary | ICD-10-CM | POA: Insufficient documentation

## 2024-07-05 DIAGNOSIS — K581 Irritable bowel syndrome with constipation: Secondary | ICD-10-CM | POA: Diagnosis not present

## 2024-07-05 NOTE — Patient Instructions (Addendum)
 Follow up as needed.  Use Pantoprazole  as needed, may consider daily for a month.   Use regimen as discussed with Harlene, PA to keep bowels moving.   _______________________________________________________  If your blood pressure at your visit was 140/90 or greater, please contact your primary care physician to follow up on this.  _______________________________________________________  If you are age 55 or older, your body mass index should be between 23-30. Your Body mass index is 31.35 kg/m. If this is out of the aforementioned range listed, please consider follow up with your Primary Care Provider.  If you are age 34 or younger, your body mass index should be between 19-25. Your Body mass index is 31.35 kg/m. If this is out of the aformentioned range listed, please consider follow up with your Primary Care Provider.   ________________________________________________________  The [facility] providers would like to encourage you to use MYCHART to communicate with providers for non-urgent requests or questions.  Due to long hold times on the telephone, sending your provider a message by [facility] may be a faster and more efficient way to get a response.  Please allow 48 business hours for a response.  Please remember that this is for non-urgent requests.  _______________________________________________________  [facility] is using a team-based approach to care.  Your team is made up of your doctor and two to three APPS. Our APPS (Nurse Practitioners and Physician Assistants) work with your physician to ensure care continuity for you. They are fully qualified to address your health concerns and develop a treatment plan. They communicate directly with your gastroenterologist to care for you. Seeing the Advanced Practice Practitioners on your physician's team can help you by facilitating care more promptly, often allowing for earlier appointments, access to diagnostic testing,  procedures, and other specialty referrals.

## 2024-07-05 NOTE — Progress Notes (Signed)
 "    07/05/2024 [name] [id number] 1971/04/11   Discussed the use of AI scribe software for clinical note transcription with the patient, who gave verbal consent to proceed.  History of Present Illness [name] is a 54 year old female with type 2 diabetes mellitus on Mounjaro  who presents for evaluation of gastrointestinal symptoms.  She was previously a patient of Dr. Eda.  Her care is being assigned to Dr. Shila.  In October 2025, she experienced a single episode of severe abdominal spasms lasting four to five days, described as intense enough to cause tearfulness and inability to stand. Abdominal x-ray at that time was unremarkable. No recurrence of severe spasms has occurred in the past four to five months now.  She has a history of persistent daily nausea while on Ozempic , which improved after switching to Mounjaro . Currently on the lowest dose of Mounjaro , she continues to experience gastrointestinal side effects but with fewer symptoms than on Ozempic . Glycemic control has remained good and stable for the past four years.  After eating high-fat or heavy meals, such as burgers or steak, she experiences prolonged fullness, is able to burp and taste food for two to three days, and occasionally vomits partially digested food. She avoids certain foods, limits portion sizes, and typically eats half portions, especially with steak or burgers. Belching and infrequent sensation of food getting stuck are also reported.  Bowel habits alternate between constipation and diarrhea, with constipation predominating. Amitiza  was trialed for constipation without significant benefit. She prefers to minimize pill burden and sometimes uses dietary modifications to manage symptoms, expressing interest in non-pharmacologic strategies.  Rare episodes of chest pain occur two to three times per year, most recently the night before a recent cardiology appointment. Says that cardiology thought  more esophageal/GI related.  Pantoprazole  is used as needed for belching or difficulty digesting food, though she is unsure of its benefit and does not prefer to take it regularly.  Colonoscopy in May 2022 showed a small polyp, with the initial procedure limited by poor bowel prep and vomiting; repeat procedure in June 2022 with Sutab was better tolerated. Upper endoscopy in 2022 revealed esophagitis and a large amount of liquid in the stomach, she believes it was performed shortly after starting GLP-1 agonist therapy.  Past Medical History:  Diagnosis Date   Allergy    Anemia    Anxiety    claustrophobic   Asthma    Back pain    Biceps tendonosis of right shoulder    Cataract    Mixed OU   COVID-19    covid PNA hospitalized 2020, 06/06/21   Diabetes mellitus 2011   did not start metforfin, losing weight   Difficult intubation    tooth got chipped one time   Dyspnea    Essential hypertension    Fatty liver    Food allergy    Frozen shoulder    left, had steroid injection 10/05/21   Headache disorder    History of concussion    HLD (hyperlipidemia)    Hypertensive retinopathy    OU   IBS (irritable bowel syndrome)    Infertility, female    Joint pain    Lactose intolerance    Leg edema    Migraines    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   Palpitation    PONV (postoperative nausea and vomiting)    Post-menopausal    Seborrheic dermatitis    Shoulder impingement syndrome, right  Type 2 diabetes mellitus with complication, with long-term current use of insulin  (HCC) 2018   Uveitis    [facility] est in 2020 seen specialist in [place] [facility] last seen 11 or 05/2020   Vitamin D  deficiency    Past Surgical History:  Procedure Laterality Date   ABDOMINAL HYSTERECTOMY  2006   heavy menses, endometriosis, l oophrectomy   BREAST BIOPSY Right 2018   benign   BREAST EXCISIONAL BIOPSY Right 2003   benign   BREAST EXCISIONAL BIOPSY Right 1999   benign    COLONOSCOPY  10/06/2020   INGUINAL HERNIA REPAIR Left 2003   LEFT OOPHORECTOMY  2006   LUMBAR FUSION  2005   L5-S1   SHOULDER ARTHROSCOPY WITH ROTATOR CUFF REPAIR AND SUBACROMIAL DECOMPRESSION  04/06/2023   Procedure: SHOULDER ARTHROSCOPY WITH ROTATOR CUFF REPAIR , EXTENSIVE DEBRIDEMENT, AND SUBACROMIAL DECOMPRESSION;  Surgeon: Genelle Standing, MD;  Location: [facility];  Service: Orthopedics;;   SHOULDER ARTHROSCOPY WITH SUBACROMIAL DECOMPRESSION AND OPEN ROTATOR C Right 07/14/2016   Procedure: RIGHT SHOULDER ARTHROSCOPY WITH SUBACROMIAL DECOMPRESSION, DISTAL CLAVICLE RESECTION AND MINI OPEN ROTATOR CUFF REPAIR, OPEN BICEP TENDODESIS;  Surgeon: Maude LELON Right, MD;  Location: [facility];  Service: Orthopedics;  Laterality: Right;   SHOULDER CLOSED REDUCTION Right 09/08/2016   Procedure: RIGHT CLOSED MANIPULATION SHOULDER;  Surgeon: Maude LELON Right, MD;  Location: [facility];  Service: Orthopedics;  Laterality: Right;   SHOULDER CLOSED REDUCTION Right 12/26/2023   Procedure: MANIPULATION, JOINT, SHOULDER, WITH ANESTHESIA;  Surgeon: Genelle Standing, MD;  Location:  SURGERY CENTER;  Service: Orthopedics;  Laterality: Right;   TUBAL LIGATION     UPPER GASTROINTESTINAL ENDOSCOPY  10/06/2020    reports that she has never smoked. She has never used smokeless tobacco. She reports that she does not drink alcohol and does not use drugs. family history includes Breast cancer in her paternal grandmother; Cancer (age of onset: 62) in her father; Cancer (age of onset: 96) in her paternal grandmother; Colon polyps in her mother; Depression in her mother; Diabetes in her maternal grandmother, mother, and son; Heart disease in her father; Heart disease (age of onset: 59) in her mother; Heart failure in her mother; Hyperlipidemia in her mother; Hypertension in her maternal grandmother, mother, and sister; Mental illness in her sister; Obesity in her mother; Sleep apnea in her  mother; Stroke in her mother; Thyroid  disease in her mother. Allergies[1]    Outpatient Encounter Medications as of 07/05/2024  Medication Sig   albuterol  (VENTOLIN  HFA) 108 (90 Base) MCG/ACT inhaler Inhale 2 puffs into the lungs every 6 (six) hours as needed for wheezing or shortness of breath.   aspirin  EC 81 MG tablet Take 81 mg by mouth daily. Swallow whole.   Atogepant  (QULIPTA ) 60 MG TABS Take 1 tablet (60 mg total) by mouth daily.   atorvastatin  (LIPITOR) 20 MG tablet Take 1 tablet (20 mg total) by mouth daily.   Blood Glucose Monitoring Suppl (ACCU-CHEK GUIDE) w/Device KIT Use to check blood sugar 3 times a day.   butalbital -acetaminophen -caffeine  (FIORICET ) 50-325-40 MG tablet Take 1 tablet by mouth every 6 (six) hours as needed for headache. Do not refill in less than 30 day   Continuous Glucose Sensor (FREESTYLE LIBRE 3 PLUS SENSOR) MISC Change every 14 (fourteen) days as directed.   diazepam  (VALIUM ) 5 MG tablet Take 1 tablet (5 mg total) by mouth every 12 (twelve) hours as needed for anxiety.   dicyclomine  (BENTYL ) 20 MG tablet Take  1 tablet (20 mg total) by mouth every 6 (six) hours.   EPINEPHrine  0.3 mg/0.3 mL IJ SOAJ injection Inject 0.3 mg into the muscle as needed for anaphylaxis.   glucose blood (ACCU-CHEK GUIDE TEST) test strip Use to check blood sugar 3 times a day.   Lancets (FREESTYLE) lancets Use to check blood sugar 3 times a day.   losartan  (COZAAR ) 100 MG tablet Take 1 tablet (100 mg total) by mouth daily.   lubiprostone  (AMITIZA ) 8 MCG capsule Take 1 capsule (8 mcg total) by mouth 2 (two) times daily with a meal.   meloxicam  (MOBIC ) 15 MG tablet Take 1 tablet (15 mg total) by mouth daily.   metFORMIN  (GLUCOPHAGE -XR) 500 MG 24 hr tablet Take 1 tablet (500 mg total) by mouth daily.   metoprolol  succinate (TOPROL -XL) 25 MG 24 hr tablet Take 1/2 tablet (12.5 mg total) by mouth daily.   ondansetron  (ZOFRAN -ODT) 4 MG disintegrating tablet Take 1 tablet (4 mg total) by  mouth every 8 (eight) hours as needed for nausea or vomiting.   pantoprazole  (PROTONIX ) 40 MG tablet Take 1 tablet (40 mg total) by mouth daily. (Patient taking differently: Take 40 mg by mouth as needed.)   pregabalin  (LYRICA ) 25 MG capsule Take 1 capsule (25 mg total) by mouth at bedtime.   promethazine -phenylephrine  (PROMETHAZINE  VC) 6.25-5 MG/5ML SYRP Take 5 mLs by mouth every 8 (eight) hours as needed for congestion.   tirzepatide  (MOUNJARO ) 5 MG/0.5ML Pen Inject 5 mg into the skin once a week.   Ubrogepant  (UBRELVY ) 100 MG TABS Take 1 tablet (100 mg total) by mouth as needed. May repeat dose in 2 hours if needed. Max dose 2 tablets in 2 hours   Vitamin D , Ergocalciferol , (DRISDOL ) 1.25 MG (50000 UNIT) CAPS capsule Take 1 capsule by mouth once a week.   No facility-administered encounter medications on file as of 07/05/2024.    REVIEW OF SYSTEMS  : All other systems reviewed and negative except where noted in the History of Present Illness.   PHYSICAL EXAM: BP 130/82   Pulse 75   Ht 5' 6 (1.676 m)   Wt 194 lb 4 oz (88.1 kg)   BMI 31.35 kg/m  General: Well developed AA female in no acute distress Head: Normocephalic and atraumatic Eyes:  Sclerae anicteric, conjunctiva pink. Ears: Normal auditory acuity Lungs: Clear throughout to auscultation; no W/R/R. Heart: Regular rate and rhythm; no M/R/G. Abdomen: Soft, non-distended.  BS present.  Non-tender. Musculoskeletal: Symmetrical with no gross deformities  Skin: No lesions on visible extremities Extremities: No edema  Neurological: Alert oriented x 4, grossly non-focal Psychological:  Alert and cooperative. 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 She remains on the lowest effective dose of Mounjaro , prioritizing optimal glycemic control despite ongoing gastrointestinal side effects. -  Recommended dietary modifications: smaller, more frequent meals and avoidance of high-fat foods. - Discussed pantoprazole  as needed for upper gastrointestinal symptoms or a daily trial for one month to assess benefit. - Advised continuation of Mounjaro  at the current lowest dose per her preference and glycemic control. - Provided anticipatory guidance regarding the chronicity of symptoms and their association with GLP-1 therapy.  Irritable bowel syndrome with constipation Chronic, constipation-predominant IBS, likely exacerbated by GLP-1 agonist therapy. She prefers non-pharmacologic management and minimal pill burden. - Recommended dietary and lifestyle modifications: increased intake of fruits, vegetables, water, warm fluids, olive oil, and prunes/prune juice. -  Reviewed pharmacologic options (Amitiza , Linzess, Miralax ) for use as needed if dietary measures are insufficient; may use Amitiza  intermittently if required as this was previously given to her by her PCP.  Gastroesophageal reflux disease Intermittent, mild symptoms likely worsened by delayed gastric emptying, with prior esophagitis on endoscopy. She prefers as-needed therapy. - Advised pantoprazole  as needed for reflux symptoms or a daily trial for one month to assess improvement in upper gastrointestinal symptoms. - Reinforced dietary modifications to minimize reflux, including smaller meals and avoidance of trigger foods.   CC:  Marylynn Verneita CROME, MD        [1]  Allergies Allergen Reactions   Bamlanivimab  Anaphylaxis, Itching, Palpitations, Rash, Shortness Of Breath and Swelling   Botox [Onabotulinumtoxina ] Shortness Of Breath    syncope   Clindamycin/Lincomycin Other (See Comments)   Contrast Media [Iodinated Contrast Media] Shortness Of Breath and Palpitations   Kiwi Extract Shortness Of Breath   Maxalt [Rizatriptan Benzoate] Anaphylaxis    Chest pain   Nitrous Oxide Shortness Of Breath    syncope   Rizatriptan  Benzoate Anaphylaxis    Chest pain   Triptans Shortness Of Breath   Vyepti  [Eptinezumab -Jjmr] Shortness Of Breath, Palpitations and Other (See Comments)    Throat soreness, tachycardia   Aspirin  Nausea And Vomiting    Mouth blisters   Haemophilus Influenzae Vaccines    Latex     Sometimes causes rash, welts   Mango Flavoring Agent (Non-Screening) Swelling    Lips swell   Vicodin [Hydrocodone-Acetaminophen ]     hallucinations   Erythromycin Rash   Flagyl  [Metronidazole ] Rash   Influenza Virus Vaccine Palpitations   Penicillins Nausea And Vomiting, Rash and Other (See Comments)    Did it involve swelling of the face/tongue/throat, SOB, or low BP? No Did it involve sudden or severe rash/hives, skin peeling, or any reaction on the inside of your mouth or nose? No Did you need to seek medical attention at a hospital or doctor's office? Yes When did it last happen?     patient was 54 years old  If all above answers are NO, may proceed with cephalosporin use.   Tetracyclines & Related Nausea And Vomiting and Rash   "

## 2024-07-09 ENCOUNTER — Ambulatory Visit: Payer: Self-pay | Admitting: Obstetrics & Gynecology

## 2024-07-09 ENCOUNTER — Encounter (HOSPITAL_BASED_OUTPATIENT_CLINIC_OR_DEPARTMENT_OTHER): Payer: Self-pay | Admitting: Orthopaedic Surgery

## 2024-07-12 ENCOUNTER — Other Ambulatory Visit (HOSPITAL_COMMUNITY): Payer: Self-pay

## 2024-07-12 ENCOUNTER — Other Ambulatory Visit: Payer: Self-pay | Admitting: Internal Medicine

## 2024-07-12 ENCOUNTER — Ambulatory Visit (HOSPITAL_BASED_OUTPATIENT_CLINIC_OR_DEPARTMENT_OTHER): Admitting: Orthopaedic Surgery

## 2024-07-12 ENCOUNTER — Ambulatory Visit: Admitting: Internal Medicine

## 2024-07-12 ENCOUNTER — Other Ambulatory Visit: Payer: Self-pay

## 2024-07-12 ENCOUNTER — Encounter: Payer: Self-pay | Admitting: Internal Medicine

## 2024-07-12 VITALS — BP 124/82 | HR 86 | Ht 66.0 in | Wt 193.0 lb

## 2024-07-12 DIAGNOSIS — E7849 Other hyperlipidemia: Secondary | ICD-10-CM

## 2024-07-12 DIAGNOSIS — E1140 Type 2 diabetes mellitus with diabetic neuropathy, unspecified: Secondary | ICD-10-CM

## 2024-07-12 DIAGNOSIS — E66811 Obesity, class 1: Secondary | ICD-10-CM

## 2024-07-12 DIAGNOSIS — M7501 Adhesive capsulitis of right shoulder: Secondary | ICD-10-CM

## 2024-07-12 MED ORDER — LIDOCAINE HCL 1 % IJ SOLN
4.0000 mL | INTRAMUSCULAR | Status: AC | PRN
  Administered 2024-07-12: 4 mL

## 2024-07-12 MED ORDER — EMPAGLIFLOZIN 10 MG PO TABS
10.0000 mg | ORAL_TABLET | Freq: Every day | ORAL | 3 refills | Status: AC
  Filled 2024-07-12: qty 90, 90d supply, fill #0

## 2024-07-12 MED ORDER — MOUNJARO 5 MG/0.5ML ~~LOC~~ SOAJ
5.0000 mg | SUBCUTANEOUS | 3 refills | Status: AC
  Filled 2024-07-12: qty 6, 84d supply, fill #0

## 2024-07-12 NOTE — Patient Instructions (Addendum)
 Please continue: - Mounjaro  5 mg weekly - Metformin  ER 500 mg daily  Please start: - Jardiance  10 mg before b'fast   Please return in 4-6 months.

## 2024-07-12 NOTE — Progress Notes (Signed)
 "                                Post Operative Evaluation    Procedure/Date of Surgery: Right shoulder capsular release, biceps tenodesis 7/22  Interval History:   Presents today for follow-up after an injury where she tripped and fell on this right side while tripping over a CPR stool.  Since this time she has had extremely limited overhead range of motion.   PMH/PSH/Family History/Social History/Meds/Allergies:    Past Medical History:  Diagnosis Date   Allergy    Anemia    Anxiety    claustrophobic   Asthma    Back pain    Biceps tendonosis of right shoulder    Cataract    Mixed OU   COVID-19    covid PNA hospitalized 2020, 06/06/21   Diabetes mellitus 2011   did not start metforfin, losing weight   Difficult intubation    tooth got chipped one time   Dyspnea    Essential hypertension    Fatty liver    Food allergy    Frozen shoulder    left, had steroid injection 10/05/21   Headache disorder    History of concussion    HLD (hyperlipidemia)    Hypertensive retinopathy    OU   IBS (irritable bowel syndrome)    Infertility, female    Joint pain    Lactose intolerance    Leg edema    Migraines    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   Palpitation    PONV (postoperative nausea and vomiting)    Post-menopausal    Seborrheic dermatitis    Shoulder impingement syndrome, right    Type 2 diabetes mellitus with complication, with long-term current use of insulin  (HCC) 2018   Uveitis    [place] [facility] in 2020 seen specialist in [place] [facility] last seen 11 or 05/2020   Vitamin D  deficiency    Past Surgical History:  Procedure Laterality Date   ABDOMINAL HYSTERECTOMY  2006   heavy menses, endometriosis, l oophrectomy   BREAST BIOPSY Right 2018   benign   BREAST EXCISIONAL BIOPSY Right 2003   benign   BREAST EXCISIONAL BIOPSY Right 1999   benign   COLONOSCOPY  10/06/2020   INGUINAL HERNIA REPAIR Left  2003   LEFT OOPHORECTOMY  2006   LUMBAR FUSION  2005   L5-S1   SHOULDER ARTHROSCOPY WITH ROTATOR CUFF REPAIR AND SUBACROMIAL DECOMPRESSION  04/06/2023   Procedure: SHOULDER ARTHROSCOPY WITH ROTATOR CUFF REPAIR , EXTENSIVE DEBRIDEMENT, AND SUBACROMIAL DECOMPRESSION;  Surgeon: Genelle Standing, MD;  Location: [facility];  Service: Orthopedics;;   SHOULDER ARTHROSCOPY WITH SUBACROMIAL DECOMPRESSION AND OPEN ROTATOR C Right 07/14/2016   Procedure: RIGHT SHOULDER ARTHROSCOPY WITH SUBACROMIAL DECOMPRESSION, DISTAL CLAVICLE RESECTION AND MINI OPEN ROTATOR CUFF REPAIR, OPEN BICEP TENDODESIS;  Surgeon: Maude LELON Right, MD;  Location: [facility];  Service: Orthopedics;  Laterality: Right;   SHOULDER CLOSED REDUCTION Right 09/08/2016   Procedure: RIGHT CLOSED MANIPULATION SHOULDER;  Surgeon: Maude LELON Right, MD;  Location: [facility];  Service: Orthopedics;  Laterality: Right;   SHOULDER CLOSED REDUCTION Right 12/26/2023   Procedure: MANIPULATION, JOINT, SHOULDER, WITH ANESTHESIA;  Surgeon: Genelle Standing, MD;  Location: [facility];  Service: Orthopedics;  Laterality: Right;   TUBAL LIGATION     UPPER GASTROINTESTINAL ENDOSCOPY  10/06/2020   Social History  Socioeconomic History   Marital status: Divorced    Spouse name: Not on file   Number of children: 2   Years of education: 63   Highest education level: Master's degree (e.g., MA, MS, MEng, MEd, MSW, MBA)  Occupational History   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  Has a son and a daughter.   Right-handed.   1-3 cups  caffeine  weekly.   Social Drivers of Health   Tobacco Use: Low Risk (07/12/2024)   Patient History    Smoking Tobacco Use: Never    Smokeless Tobacco Use: Never    Passive Exposure: Not on file  Financial Resource Strain: Not on file  Food Insecurity: Not on file  Transportation Needs: Not on file  Physical Activity: Not on file  Stress: Not on file  Social Connections: Not on file  Depression (PHQ2-9): Low Risk (06/10/2024)   Depression (PHQ2-9)    PHQ-2 Score: 0  Alcohol Screen: Not on file  Housing: Not on file  Utilities: Not on file  Health Literacy: Not on file   Family History  Problem Relation Age of Onset   Diabetes Mother    Heart disease Mother 47   Hypertension Mother    Hyperlipidemia Mother    Heart failure Mother    Stroke Mother    Thyroid  disease Mother    Depression Mother    Sleep apnea Mother    Obesity Mother    Colon polyps Mother    Cancer Father 60       Lung Cancer   Heart disease Father    Mental illness Sister        bipolar, substance abuse,  clean 2 yrs   Hypertension Sister    Cancer Paternal Grandmother 44       breast cancer   Breast cancer Paternal Grandmother    Diabetes Maternal Grandmother    Hypertension Maternal Grandmother    Diabetes Son    Colon cancer Neg Hx    Esophageal cancer Neg Hx    Rectal cancer Neg Hx    Stomach cancer Neg Hx    Allergies  Allergen Reactions   Bamlanivimab  Anaphylaxis, Itching, Palpitations, Rash, Shortness Of Breath and Swelling   Botox [Onabotulinumtoxina ] Shortness Of Breath    syncope   Clindamycin/Lincomycin Other (See Comments)   Contrast Media [Iodinated Contrast Media] Shortness Of Breath and Palpitations   Kiwi Extract Shortness Of Breath   Maxalt [Rizatriptan Benzoate] Anaphylaxis    Chest pain   Nitrous Oxide Shortness Of Breath    syncope   Rizatriptan Benzoate Anaphylaxis    Chest pain   Triptans Shortness Of Breath   Vyepti   [Eptinezumab -Jjmr] Shortness Of Breath, Palpitations and Other (See Comments)    Throat soreness, tachycardia   Aspirin  Nausea And Vomiting    Mouth blisters   Haemophilus Influenzae Vaccines    Latex     Sometimes causes rash, welts   Mango Flavoring Agent (Non-Screening) Swelling    Lips swell   Vicodin [Hydrocodone-Acetaminophen ]     hallucinations  Erythromycin Rash   Flagyl  [Metronidazole ] Rash   Influenza Virus Vaccine Palpitations   Penicillins Nausea And Vomiting, Rash and Other (See Comments)    Did it involve swelling of the face/tongue/throat, SOB, or low BP? No Did it involve sudden or severe rash/hives, skin peeling, or any reaction on the inside of your mouth or nose? No Did you need to seek medical attention at a hospital or doctor's office? Yes When did it last happen?     patient was 54 years old  If all above answers are NO, may proceed with cephalosporin use.   Tetracyclines & Related Nausea And Vomiting and Rash   Current Outpatient Medications  Medication Sig Dispense Refill   albuterol  (VENTOLIN  HFA) 108 (90 Base) MCG/ACT inhaler Inhale 2 puffs into the lungs every 6 (six) hours as needed for wheezing or shortness of breath. 18 g 0   aspirin  EC 81 MG tablet Take 81 mg by mouth daily. Swallow whole.     Atogepant  (QULIPTA ) 60 MG TABS Take 1 tablet (60 mg total) by mouth daily. 30 tablet 11   atorvastatin  (LIPITOR) 20 MG tablet Take 1 tablet (20 mg total) by mouth daily. 90 tablet 0   Blood Glucose Monitoring Suppl (ACCU-CHEK GUIDE) w/Device KIT Use to check blood sugar 3 times a day. 1 kit 0   butalbital -acetaminophen -caffeine  (FIORICET ) 50-325-40 MG tablet Take 1 tablet by mouth every 6 (six) hours as needed for headache. Do not refill in less than 30 day 30 tablet 5   Continuous Glucose Sensor (FREESTYLE LIBRE 3 PLUS SENSOR) MISC Change every 14 (fourteen) days as directed. 6 each 3   diazepam  (VALIUM ) 5 MG tablet Take 1 tablet (5 mg total) by  mouth every 12 (twelve) hours as needed for anxiety. 30 tablet 5   dicyclomine  (BENTYL ) 20 MG tablet Take 1 tablet (20 mg total) by mouth every 6 (six) hours. 120 tablet 1   empagliflozin  (JARDIANCE ) 10 MG TABS tablet Take 1 tablet (10 mg total) by mouth daily before breakfast. 90 tablet 3   EPINEPHrine  0.3 mg/0.3 mL IJ SOAJ injection Inject 0.3 mg into the muscle as needed for anaphylaxis. 2 each 0   glucose blood (ACCU-CHEK GUIDE TEST) test strip Use to check blood sugar 3 times a day. 300 each 12   Lancets (FREESTYLE) lancets Use to check blood sugar 3 times a day. 300 each 12   losartan  (COZAAR ) 100 MG tablet Take 1 tablet (100 mg total) by mouth daily. 90 tablet 1   lubiprostone  (AMITIZA ) 8 MCG capsule Take 1 capsule (8 mcg total) by mouth 2 (two) times daily with a meal. 60 capsule 0   meloxicam  (MOBIC ) 15 MG tablet Take 1 tablet (15 mg total) by mouth daily. 30 tablet 3   metFORMIN  (GLUCOPHAGE -XR) 500 MG 24 hr tablet Take 1 tablet (500 mg total) by mouth daily. 90 tablet 3   metoprolol  succinate (TOPROL -XL) 25 MG 24 hr tablet Take 1/2 tablet (12.5 mg total) by mouth daily. 15 tablet 1   ondansetron  (ZOFRAN -ODT) 4 MG disintegrating tablet Take 1 tablet (4 mg total) by mouth every 8 (eight) hours as needed for nausea or vomiting. 20 tablet 0   pantoprazole  (PROTONIX ) 40 MG tablet Take 1 tablet (40 mg total) by mouth daily. (Patient taking differently: Take 40 mg by mouth as needed.) 30 tablet 3   pregabalin  (LYRICA ) 25 MG capsule Take 1 capsule (25 mg total) by mouth at bedtime. 90 capsule 3   promethazine -phenylephrine  (PROMETHAZINE  VC)  6.25-5 MG/5ML SYRP Take 5 mLs by mouth every 8 (eight) hours as needed for congestion. 280 mL 0   tirzepatide  (MOUNJARO ) 5 MG/0.5ML Pen Inject 5 mg into the skin once a week. 6 mL 3   Ubrogepant  (UBRELVY ) 100 MG TABS Take 1 tablet (100 mg total) by mouth as needed. May repeat dose in 2 hours if needed. Max dose 2 tablets in 2 hours 16 tablet 11    Vitamin D , Ergocalciferol , (DRISDOL ) 1.25 MG (50000 UNIT) CAPS capsule Take 1 capsule by mouth once a week. 4 capsule 11   No current facility-administered medications for this visit.   No results found.   Review of Systems:   A ROS was performed including pertinent positives and negatives as documented in the HPI.   Musculoskeletal Exam:    There were no vitals taken for this visit.  Right shoulder with very limited range of motion.  Active elevation in forward position is to 50 degrees with pain external rotation at the side is to neutral  Imaging:     I personally reviewed and interpreted the radiographs.   Assessment:   54 year old female with evidence of right shoulder adhesive capsulitis after a fall directly on the side.  At this time I did discuss that unfortunately her blood sugars do tend to be spiked with steroid injections or oral steroid.  Given this I did discuss possibility of a Toradol  injection.  We will plan to engage her with physical therapy for gentle range of motion and strengthening Plan :    - Right shoulder ultrasound-guided injection provided after verbal consent obtained    Procedure Note  Patient: [name]             Date of Birth: [birth year]           MRN: [id number]             Visit Date: 07/12/2024  Procedures: Visit Diagnoses:  1. Adhesive capsulitis of right shoulder     Large Joint Inj: R glenohumeral on 07/12/2024 12:19 PM Indications: pain Details: 22 G 1.5 in needle, ultrasound-guided anterior approach  Arthrogram: No  Medications: 4 mL lidocaine  1 % Outcome: tolerated well, no immediate complications Procedure, treatment alternatives, risks and benefits explained, specific risks discussed. Consent was given by the patient. Immediately prior to procedure a time out was called to verify the correct patient, procedure, equipment, support staff and site/side marked as required. Patient was prepped and draped in the usual  sterile fashion.            I personally saw and evaluated the patient, and participated in the management and treatment plan.  Elspeth Parker, MD Attending Physician, Orthopedic Surgery  This document was dictated using Dragon voice recognition software. A reasonable attempt at proof reading has been made to minimize errors. "

## 2024-07-12 NOTE — Progress Notes (Signed)
 Patient ID: [name], female   DOB: 12-13-1970, 54 y.o.   MRN: [id number]  HPI: ALEEHA BOLINE is a 54 y.o.-year-old female, returning for follow-up for DM2, dx in 2013, with history of GDM in 1990 and 2004, previously insulin -dependent since 2018, but currently off insulin , now better controlled, with complications (mild CKD, PN).  She was previously seen by Dr. Kassie. Last visit with me 4 months ago.  Interim history: No increased urination, blurry vision. She has constipation, diarrhea - related to IBS. She does not feel that they are related to Mounjaro . She had R shoulder sx in 2018,  2024. She has pbs with nerve compression, carpal and cubital tunnel sd.  She was in PT/OT and started Lyrica . She had another shoulder sx 12/2023.  At last visit, she still had pain when raising her arms. She fell 07/03/2024 in a classroom >> hurt R shoulder, elbow, hand >> seeing ortho, got a toradol  injection, but declined steroids. Sugars are higher.  Reviewed HbA1c: Lab Results  Component Value Date   HGBA1C 6.2 (A) 03/11/2024   HGBA1C 6.4 (A) 11/28/2023   HGBA1C 6.5 (A) 07/28/2023   HGBA1C 5.9 (A) 01/17/2023   HGBA1C 6.3 (A) 06/30/2022   HGBA1C 6.5 (A) 01/28/2022   HGBA1C 8.1 (A) 10/19/2021   HGBA1C 7.5 (A) 08/03/2021   HGBA1C 7.6 (A) 05/03/2021   HGBA1C 7.0 (A) 01/26/2021   Previous on: - Ozempic  2 mg weekly - Humalog  22 -24 (30) units 2x a day after meals (patient has mostly 2 meals a day) She tried Victoza  >> dysphagia. She tried Jardiance  >> yeast infections. She was taking Metformin  11/2020 after he had IBS symptoms.  Now on: - Mounjaro  2.5 >> 5 mg weekly - Metformin  ER 500 mg with dinner  She was previously on Tresiba , started 10/2021: 14, then 20 units daily, but was able to come off. She had N/V/D with higher doses of metformin  ER. She was previously on Ozempic  2 and then 1 mg weekly but had nausea in the Saturday after the injection.  Pt checks her sugars more than 4  times a day with her CGM -recently switched from Dexcom (not covered) to libre:   Previously:  Previously:  Lowest sugar was  45 >> 68 >> <70 >> 58 >> 57 (sensor); she has hypoglycemia awareness at 70.  Highest sugar was 300s >> ... 265 >> 200s >> 248 >> 226 (pain).  -+ Stage IIIa CKD, last BUN/creatinine:  Lab Results  Component Value Date   BUN 11 12/21/2023   BUN 10 10/09/2023   CREATININE 0.86 12/21/2023   CREATININE 0.77 10/09/2023   Lab Results  Component Value Date   MICRALBCREAT 39.9 (H) 06/10/2024   MICRALBCREAT 39.2 (H) 10/09/2023   MICRALBCREAT 29.3 08/22/2023   MICRALBCREAT 14.4 07/06/2017  She on Losartan .  -+ HL; last set of lipids: Lab Results  Component Value Date   CHOL 125 10/09/2023   HDL 43 (L) 10/09/2023   LDLCALC 68 10/09/2023   LDLDIRECT 88.0 09/21/2021   TRIG 55 10/09/2023   CHOLHDL 2.9 10/09/2023  On Lipitor 20 mg daily.  - last eye exam was 04/24/2024. No DR. [facility].  - + numbness and tingling in her feet. Last foot exam: 08/21/2023.  On ASA 81.  Patient also has a history of HTN, fatty liver, IBS.  ROS: + see HPI  Past Medical History:  Diagnosis Date   Allergy    Anemia    Anxiety    claustrophobic  Asthma    Back pain    Biceps tendonosis of right shoulder    Cataract    Mixed OU   COVID-19    covid PNA hospitalized 2020, 06/06/21   Diabetes mellitus 2011   did not start metforfin, losing weight   Difficult intubation    tooth got chipped one time   Dyspnea    Essential hypertension    Fatty liver    Food allergy    Frozen shoulder    left, had steroid injection 10/05/21   Headache disorder    History of concussion    HLD (hyperlipidemia)    Hypertensive retinopathy    OU   IBS (irritable bowel syndrome)    Infertility, female    Joint pain    Lactose intolerance    Leg edema    Migraines    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   Palpitation    PONV (postoperative nausea and vomiting)     Post-menopausal    Seborrheic dermatitis    Shoulder impingement syndrome, right    Type 2 diabetes mellitus with complication, with long-term current use of insulin  (HCC) 2018   Uveitis    [facility] in 2020 seen specialist in [place] [facility] last seen 11 or 05/2020   Vitamin D  deficiency    Past Surgical History:  Procedure Laterality Date   ABDOMINAL HYSTERECTOMY  2006   heavy menses, endometriosis, l oophrectomy   BREAST BIOPSY Right 2018   benign   BREAST EXCISIONAL BIOPSY Right 2003   benign   BREAST EXCISIONAL BIOPSY Right 1999   benign   COLONOSCOPY  10/06/2020   INGUINAL HERNIA REPAIR Left 2003   LEFT OOPHORECTOMY  2006   LUMBAR FUSION  2005   L5-S1   SHOULDER ARTHROSCOPY WITH ROTATOR CUFF REPAIR AND SUBACROMIAL DECOMPRESSION  04/06/2023   Procedure: SHOULDER ARTHROSCOPY WITH ROTATOR CUFF REPAIR , EXTENSIVE DEBRIDEMENT, AND SUBACROMIAL DECOMPRESSION;  Surgeon: Genelle Standing, MD;  Location: [facility];  Service: Orthopedics;;   SHOULDER ARTHROSCOPY WITH SUBACROMIAL DECOMPRESSION AND OPEN ROTATOR C Right 07/14/2016   Procedure: RIGHT SHOULDER ARTHROSCOPY WITH SUBACROMIAL DECOMPRESSION, DISTAL CLAVICLE RESECTION AND MINI OPEN ROTATOR CUFF REPAIR, OPEN BICEP TENDODESIS;  Surgeon: Maude LELON Right, MD;  Location: [facility];  Service: Orthopedics;  Laterality: Right;   SHOULDER CLOSED REDUCTION Right 09/08/2016   Procedure: RIGHT CLOSED MANIPULATION SHOULDER;  Surgeon: Maude LELON Right, MD;  Location: [facility];  Service: Orthopedics;  Laterality: Right;   SHOULDER CLOSED REDUCTION Right 12/26/2023   Procedure: MANIPULATION, JOINT, SHOULDER, WITH ANESTHESIA;  Surgeon: Genelle Standing, MD;  Location:  SURGERY CENTER;  Service: Orthopedics;  Laterality: Right;   TUBAL LIGATION     UPPER GASTROINTESTINAL ENDOSCOPY  10/06/2020   Social History   Socioeconomic History   Marital status: Divorced    Spouse name: Not on file    Number of children: 2   Years of education: 14   Highest education level: Master's degree (e.g., MA, MS, MEng, MEd, MSW, MBA)  Occupational History   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  Has a son and a daughter.   Right-handed.   1-3 cups caffeine  weekly.   Social Drivers of Health   Tobacco Use: Low Risk (07/05/2024)   Patient History    Smoking Tobacco Use: Never    Smokeless Tobacco Use: Never    Passive Exposure: Not on file  Financial Resource Strain: Not on file  Food Insecurity: Not on file  Transportation Needs: Not on file  Physical Activity: Not on file  Stress: Not on file  Social Connections: Not on file  Intimate Partner Violence: Not on file  Depression (PHQ2-9): Low Risk (06/10/2024)   Depression (PHQ2-9)    PHQ-2 Score: 0  Alcohol Screen: Not on file  Housing: Not on file  Utilities: Not on file  Health Literacy: Not on file   Current Outpatient Medications on File Prior to Visit  Medication Sig Dispense Refill   albuterol  (VENTOLIN  HFA) 108 (90 Base) MCG/ACT inhaler Inhale 2 puffs into the lungs every 6 (six) hours as needed for wheezing or shortness of breath. 18 g 0   aspirin  EC 81 MG tablet Take 81 mg by mouth daily. Swallow whole.     Atogepant  (QULIPTA ) 60 MG TABS Take 1 tablet (60 mg total) by mouth daily. 30 tablet 11   atorvastatin  (LIPITOR) 20 MG tablet Take 1 tablet (20 mg total) by mouth daily. 90 tablet 0   Blood Glucose Monitoring Suppl (ACCU-CHEK GUIDE) w/Device KIT Use to check blood sugar 3 times a day. 1 kit 0   butalbital -acetaminophen -caffeine  (FIORICET ) 50-325-40 MG tablet Take 1  tablet by mouth every 6 (six) hours as needed for headache. Do not refill in less than 30 day 30 tablet 5   Continuous Glucose Sensor (FREESTYLE LIBRE 3 PLUS SENSOR) MISC Change every 14 (fourteen) days as directed. 6 each 3   diazepam  (VALIUM ) 5 MG tablet Take 1 tablet (5 mg total) by mouth every 12 (twelve) hours as needed for anxiety. 30 tablet 5   dicyclomine  (BENTYL ) 20 MG tablet Take 1 tablet (20 mg total) by mouth every 6 (six) hours. 120 tablet 1   EPINEPHrine  0.3 mg/0.3 mL IJ SOAJ injection Inject 0.3 mg into the muscle as needed for anaphylaxis. 2 each 0   glucose blood (ACCU-CHEK GUIDE TEST) test strip Use to check blood sugar 3 times a day. 300 each 12   Lancets (FREESTYLE) lancets Use to check blood sugar 3 times a day. 300 each 12   losartan  (COZAAR ) 100 MG tablet Take 1 tablet (100 mg total) by mouth daily. 90 tablet 1   lubiprostone  (AMITIZA ) 8 MCG capsule Take 1 capsule (8 mcg total) by mouth 2 (two) times daily with a meal. 60 capsule 0   meloxicam  (MOBIC ) 15 MG tablet Take 1 tablet (15 mg total) by mouth daily. 30 tablet 3   metFORMIN  (GLUCOPHAGE -XR) 500 MG 24 hr tablet Take 1 tablet (500 mg total) by mouth daily. 90 tablet 3   metoprolol  succinate (TOPROL -XL) 25 MG 24 hr tablet Take 1/2 tablet (12.5 mg total) by mouth daily. 15 tablet 1   ondansetron  (ZOFRAN -ODT) 4 MG disintegrating tablet Take 1 tablet (4 mg total) by mouth every 8 (eight) hours as needed for nausea or vomiting. 20 tablet 0   pantoprazole  (PROTONIX ) 40 MG tablet Take 1 tablet (40 mg total) by mouth daily. (Patient taking differently: Take 40 mg by mouth as needed.) 30 tablet 3   pregabalin  (LYRICA )  25 MG capsule Take 1 capsule (25 mg total) by mouth at bedtime. 90 capsule 3   promethazine -phenylephrine  (PROMETHAZINE  VC) 6.25-5 MG/5ML SYRP Take 5 mLs by mouth every 8 (eight) hours as needed for congestion. 280 mL 0   tirzepatide  (MOUNJARO ) 5 MG/0.5ML Pen Inject 5 mg into the skin once a week. 6 mL 1   Ubrogepant   (UBRELVY ) 100 MG TABS Take 1 tablet (100 mg total) by mouth as needed. May repeat dose in 2 hours if needed. Max dose 2 tablets in 2 hours 16 tablet 11   Vitamin D , Ergocalciferol , (DRISDOL ) 1.25 MG (50000 UNIT) CAPS capsule Take 1 capsule by mouth once a week. 4 capsule 11   No current facility-administered medications on file prior to visit.   Allergies  Allergen Reactions   Bamlanivimab  Anaphylaxis, Itching, Palpitations, Rash, Shortness Of Breath and Swelling   Botox [Onabotulinumtoxina ] Shortness Of Breath    syncope   Clindamycin/Lincomycin Other (See Comments)   Contrast Media [Iodinated Contrast Media] Shortness Of Breath and Palpitations   Kiwi Extract Shortness Of Breath   Maxalt [Rizatriptan Benzoate] Anaphylaxis    Chest pain   Nitrous Oxide Shortness Of Breath    syncope   Rizatriptan Benzoate Anaphylaxis    Chest pain   Triptans Shortness Of Breath   Vyepti  [Eptinezumab -Jjmr] Shortness Of Breath, Palpitations and Other (See Comments)    Throat soreness, tachycardia   Aspirin  Nausea And Vomiting    Mouth blisters   Haemophilus Influenzae Vaccines    Latex     Sometimes causes rash, welts   Mango Flavoring Agent (Non-Screening) Swelling    Lips swell   Vicodin [Hydrocodone-Acetaminophen ]     hallucinations   Erythromycin Rash   Flagyl  [Metronidazole ] Rash   Influenza Virus Vaccine Palpitations   Penicillins Nausea And Vomiting, Rash and Other (See Comments)    Did it involve swelling of the face/tongue/throat, SOB, or low BP? No Did it involve sudden or severe rash/hives, skin peeling, or any reaction on the inside of your mouth or nose? No Did you need to seek medical attention at a hospital or doctor's office? Yes When did it last happen?     patient was 54 years old  If all above answers are NO, may proceed with cephalosporin use.   Tetracyclines & Related Nausea And Vomiting and Rash   Family History  Problem Relation Age of Onset   Diabetes Mother     Heart disease Mother 60   Hypertension Mother    Hyperlipidemia Mother    Heart failure Mother    Stroke Mother    Thyroid  disease Mother    Depression Mother    Sleep apnea Mother    Obesity Mother    Colon polyps Mother    Cancer Father 19       Lung Cancer   Heart disease Father    Mental illness Sister        bipolar, substance abuse,  clean 2 yrs   Hypertension Sister    Cancer Paternal Grandmother 43       breast cancer   Breast cancer Paternal Grandmother    Diabetes Maternal Grandmother    Hypertension Maternal Grandmother    Diabetes Son    Colon cancer Neg Hx    Esophageal cancer Neg Hx    Rectal cancer Neg Hx    Stomach cancer Neg Hx    PE: BP 124/82   Pulse 86   Ht 5' 6 (1.676 m)   Wt 193  lb (87.5 kg)   SpO2 97%   BMI 31.15 kg/m   Wt Readings from Last 15 Encounters:  07/12/24 193 lb (87.5 kg)  07/05/24 194 lb 4 oz (88.1 kg)  07/03/24 193 lb (87.5 kg)  06/10/24 192 lb (87.1 kg)  03/19/24 199 lb (90.3 kg)  03/11/24 200 lb 3.2 oz (90.8 kg)  02/27/24 199 lb 3.2 oz (90.4 kg)  12/26/23 202 lb 6.1 oz (91.8 kg)  11/28/23 200 lb 9.6 oz (91 kg)  11/19/23 200 lb 3.2 oz (90.8 kg)  11/06/23 201 lb 12.8 oz (91.5 kg)  08/29/23 200 lb (90.7 kg)  08/21/23 198 lb 12.8 oz (90.2 kg)  08/01/23 201 lb 12.8 oz (91.5 kg)  04/12/23 200 lb 2 oz (90.8 kg)   Constitutional: overweight, in NAD Eyes:  EOMI, no exophthalmos ENT: no neck masses, no cervical lymphadenopathy Cardiovascular: RRR, No MRG Respiratory: CTA B Musculoskeletal: no deformities Skin:no rashes Neurological: no tremor with outstretched hands Diabetic Foot Exam - Simple   Simple Foot Form Diabetic Foot exam was performed with the following findings: Yes 07/12/2024 11:45 AM  Visual Inspection No deformities, no ulcerations, no other skin breakdown bilaterally: Yes Sensation Testing Intact to touch and monofilament testing bilaterally: Yes Pulse Check Posterior Tibialis and Dorsalis pulse intact  bilaterally: Yes Comments    ASSESSMENT: 1. DM2, currently non insulin -dependent, better controlled, with complications - CKD - mild - PN  2. HL  3.  Obesity class I  PLAN:  1. Patient with longstanding, previously uncontrolled type 2 diabetes, on weekly GLP-1/GIP receptor agonist, changed from Ozempic  to Mounjaro  due to nausea with Ozempic .  At last visit, sugars were fluctuating within the target range, with only rare hyperglycemic exceptions after lunch.  We did not change the regimen.  Her Dexcom was not covered by her insurance anymore so we discussed about trying to switch to a freestyle libre 3+ CGM.  Given coupon. -At last visit, HbA1c was excellent, at 6.2%. CGM interpretation: -At today's visit, we reviewed her CGM downloads: It appears that 95% of values are in target range (goal >70%), while 5% are higher than 180 (goal <25%), and 0% are lower than 70 (goal <4%).  The calculated average blood sugar is 135.  The projected HbA1c for the next 3 months (GMI) is 6.5%. -Reviewing the CGM trends, sugars are fluctuating almost all within the target range.  She does feel that the sugars are higher lately after her fall, due to increased pain.  She did not get steroid injections in her shoulder.  Is trying to avoid these.  As of now, we discussed about continuing the same dose of metformin  and Mounjaro .  She does mention that PCP recommended an SGLT2 inhibitor to help with microalbuminuria.  She had yeast infections previously on Jardiance , but we discussed that this may have been in the setting of a baseline of higher blood sugars.  I recommended to try Jardiance  again, but to stay at the lowest dose, 10 mg daily.  We discussed about benefits. - I suggested to:  Patient Instructions  Please continue: - Mounjaro  5 mg weekly - Metformin  ER 500 mg daily  Please start: - Jardiance  10 mg before b'fast   Please return in 4-6 months.  - we checked her HbA1c: 6.2% (stable, at goal - advised  to check sugars at different times of the day - 4x a day, rotating check times - advised for yearly eye exams >> she is UTD - she had a slightly  elevated ACR.  She was advised to continue losartan .  PCP also recommended an SGLT2 inhibitor which we will start today. - return to clinic in 4-6 months  2. HL - Latest lipid panel was at goal with the exception of a slightly low HDL: Lab Results  Component Value Date   CHOL 125 10/09/2023   HDL 43 (L) 10/09/2023   LDLCALC 68 10/09/2023   LDLDIRECT 88.0 09/21/2021   TRIG 55 10/09/2023   CHOLHDL 2.9 10/09/2023  - Will continue Lipitor 20 mg daily.  She tolerates it well, without side effects  3.  Obesity class I - She was able to lose approximately 60 pounds after starting Ozempic  - we then changed to Mounjaro  due to nausea in the setting today after taking Ozempic  - She tolerates Mounjaro  well.  She does have occasional diarrhea and constipation but she felt that these were related to her IBS, not Mounjaro .  At last visit we discussed that if she had an episode of IBS with diarrhea, she can hold metformin . - she lost 7 lbs since last OV - We discussed that starting Jardiance  may also help further with weight loss.  Lela Fendt, MD PhD [facility]

## 2024-07-19 ENCOUNTER — Ambulatory Visit (HOSPITAL_BASED_OUTPATIENT_CLINIC_OR_DEPARTMENT_OTHER): Admitting: Orthopaedic Surgery

## 2024-08-23 ENCOUNTER — Ambulatory Visit (HOSPITAL_BASED_OUTPATIENT_CLINIC_OR_DEPARTMENT_OTHER): Admitting: Orthopaedic Surgery

## 2024-08-23 ENCOUNTER — Ambulatory Visit: Admitting: Obstetrics & Gynecology

## 2024-08-27 ENCOUNTER — Encounter: Admitting: Physical Medicine and Rehabilitation

## 2024-11-18 ENCOUNTER — Ambulatory Visit: Admitting: Internal Medicine

## 2025-03-19 ENCOUNTER — Telehealth: Admitting: Neurology
